# Patient Record
Sex: Female | Born: 1945
Health system: Southern US, Community
[De-identification: ages and names within clinical notes are randomized; demographics above are authoritative.]

## PROBLEM LIST (undated history)

## (undated) ENCOUNTER — Ambulatory Visit (HOSPITAL_COMMUNITY): Admission: EM | Payer: Medicare (Managed Care) | Source: Home / Self Care

## (undated) DIAGNOSIS — I340 Nonrheumatic mitral (valve) insufficiency: Secondary | ICD-10-CM

## (undated) DIAGNOSIS — I5032 Chronic diastolic (congestive) heart failure: Secondary | ICD-10-CM

## (undated) DIAGNOSIS — K219 Gastro-esophageal reflux disease without esophagitis: Secondary | ICD-10-CM

## (undated) DIAGNOSIS — I714 Abdominal aortic aneurysm, without rupture, unspecified: Secondary | ICD-10-CM

## (undated) DIAGNOSIS — Z87898 Personal history of other specified conditions: Secondary | ICD-10-CM

## (undated) DIAGNOSIS — E7800 Pure hypercholesterolemia, unspecified: Secondary | ICD-10-CM

## (undated) DIAGNOSIS — J189 Pneumonia, unspecified organism: Secondary | ICD-10-CM

## (undated) DIAGNOSIS — R011 Cardiac murmur, unspecified: Secondary | ICD-10-CM

## (undated) DIAGNOSIS — J449 Chronic obstructive pulmonary disease, unspecified: Secondary | ICD-10-CM

## (undated) DIAGNOSIS — M1990 Unspecified osteoarthritis, unspecified site: Secondary | ICD-10-CM

## (undated) DIAGNOSIS — A159 Respiratory tuberculosis unspecified: Secondary | ICD-10-CM

## (undated) DIAGNOSIS — Z9889 Other specified postprocedural states: Secondary | ICD-10-CM

## (undated) DIAGNOSIS — N179 Acute kidney failure, unspecified: Principal | ICD-10-CM

## (undated) DIAGNOSIS — Z85528 Personal history of other malignant neoplasm of kidney: Secondary | ICD-10-CM

## (undated) DIAGNOSIS — R0602 Shortness of breath: Secondary | ICD-10-CM

## (undated) DIAGNOSIS — I10 Essential (primary) hypertension: Secondary | ICD-10-CM

## (undated) HISTORY — PX: TUBAL LIGATION: SHX77

## (undated) HISTORY — DX: Nonrheumatic mitral (valve) insufficiency: I34.0

## (undated) HISTORY — DX: Abdominal aortic aneurysm, without rupture: I71.4

## (undated) HISTORY — PX: DIAGNOSTIC LAPAROSCOPY: SUR761

## (undated) HISTORY — PX: APPENDECTOMY: SHX54

## (undated) HISTORY — PX: OVARIAN CYST REMOVAL: SHX89

## (undated) HISTORY — DX: Personal history of other malignant neoplasm of kidney: Z85.528

## (undated) HISTORY — PX: KIDNEY SURGERY: SHX687

## (undated) HISTORY — PX: CHOLECYSTECTOMY: SHX55

## (undated) HISTORY — DX: Abdominal aortic aneurysm, without rupture, unspecified: I71.40

---
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
## 1969-06-20 HISTORY — PX: TUBAL LIGATION: SHX77

## 1969-06-20 HISTORY — PX: OVARIAN CYST REMOVAL: SHX89

## 1969-06-20 HISTORY — PX: APPENDECTOMY: SHX54

## 1979-06-21 DIAGNOSIS — J189 Pneumonia, unspecified organism: Secondary | ICD-10-CM

## 1979-06-21 HISTORY — DX: Pneumonia, unspecified organism: J18.9

## 2003-05-03 ENCOUNTER — Emergency Department (HOSPITAL_COMMUNITY): Admission: EM | Admit: 2003-05-03 | Discharge: 2003-05-03 | Payer: Self-pay | Admitting: Emergency Medicine

## 2003-05-03 ENCOUNTER — Encounter: Payer: Self-pay | Admitting: Emergency Medicine

## 2003-05-10 ENCOUNTER — Encounter: Admission: RE | Admit: 2003-05-10 | Discharge: 2003-05-10 | Payer: Self-pay | Admitting: Internal Medicine

## 2003-05-25 ENCOUNTER — Encounter: Admission: RE | Admit: 2003-05-25 | Discharge: 2003-05-25 | Payer: Self-pay | Admitting: Internal Medicine

## 2004-12-03 ENCOUNTER — Emergency Department (HOSPITAL_COMMUNITY): Admission: EM | Admit: 2004-12-03 | Discharge: 2004-12-03 | Payer: Self-pay | Admitting: Emergency Medicine

## 2005-06-19 ENCOUNTER — Ambulatory Visit (HOSPITAL_COMMUNITY): Admission: RE | Admit: 2005-06-19 | Discharge: 2005-06-19 | Payer: Self-pay | Admitting: Family Medicine

## 2005-07-14 ENCOUNTER — Other Ambulatory Visit: Admission: RE | Admit: 2005-07-14 | Discharge: 2005-07-14 | Payer: Self-pay | Admitting: Family Medicine

## 2009-12-27 ENCOUNTER — Encounter: Admission: RE | Admit: 2009-12-27 | Discharge: 2009-12-27 | Payer: Self-pay | Admitting: Family Medicine

## 2010-01-18 HISTORY — PX: CHOLECYSTECTOMY: SHX55

## 2010-01-18 HISTORY — PX: NEPHRECTOMY RADICAL: SUR878

## 2010-02-15 ENCOUNTER — Inpatient Hospital Stay (HOSPITAL_COMMUNITY): Admission: RE | Admit: 2010-02-15 | Discharge: 2010-02-18 | Payer: Self-pay | Admitting: Urology

## 2010-02-15 ENCOUNTER — Encounter (INDEPENDENT_AMBULATORY_CARE_PROVIDER_SITE_OTHER): Payer: Self-pay | Admitting: Urology

## 2011-01-07 LAB — CBC
HCT: 31.1 % — ABNORMAL LOW (ref 36.0–46.0)
HCT: 39.5 % (ref 36.0–46.0)
Hemoglobin: 10.5 g/dL — ABNORMAL LOW (ref 12.0–15.0)
Hemoglobin: 13.3 g/dL (ref 12.0–15.0)
MCHC: 33.6 g/dL (ref 30.0–36.0)
MCHC: 33.8 g/dL (ref 30.0–36.0)
MCV: 89.2 fL (ref 78.0–100.0)
MCV: 89.9 fL (ref 78.0–100.0)
Platelets: 261 10*3/uL (ref 150–400)
Platelets: 347 10*3/uL (ref 150–400)
RBC: 3.46 MIL/uL — ABNORMAL LOW (ref 3.87–5.11)
RBC: 4.42 MIL/uL (ref 3.87–5.11)
RDW: 12.7 % (ref 11.5–15.5)
RDW: 12.9 % (ref 11.5–15.5)
WBC: 6.4 10*3/uL (ref 4.0–10.5)
WBC: 8.4 10*3/uL (ref 4.0–10.5)

## 2011-01-07 LAB — ABO/RH: ABO/RH(D): A POS

## 2011-01-07 LAB — BASIC METABOLIC PANEL
BUN: 9 mg/dL (ref 6–23)
BUN: 15 mg/dL (ref 6–23)
BUN: 19 mg/dL (ref 6–23)
CO2: 28 mEq/L (ref 19–32)
CO2: 27 mEq/L (ref 19–32)
CO2: 27 mEq/L (ref 19–32)
Calcium: 9.3 mg/dL (ref 8.4–10.5)
Calcium: 8.6 mg/dL (ref 8.4–10.5)
Calcium: 9 mg/dL (ref 8.4–10.5)
Chloride: 105 mEq/L (ref 96–112)
Chloride: 108 mEq/L (ref 96–112)
Chloride: 109 mEq/L (ref 96–112)
Creatinine, Ser: 1.4 mg/dL — ABNORMAL HIGH (ref 0.4–1.2)
Creatinine, Ser: 0.99 mg/dL (ref 0.4–1.2)
Creatinine, Ser: 1.39 mg/dL — ABNORMAL HIGH (ref 0.4–1.2)
GFR calc Af Amer: 46 mL/min — ABNORMAL LOW (ref >60)
GFR calc Af Amer: 46 mL/min — ABNORMAL LOW (ref >60)
GFR calc Af Amer: >60 mL/min (ref >60)
GFR calc non Af Amer: 38 mL/min — ABNORMAL LOW (ref >60)
GFR calc non Af Amer: 38 mL/min — ABNORMAL LOW (ref >60)
GFR calc non Af Amer: 57 mL/min — ABNORMAL LOW (ref >60)
Glucose, Bld: 101 mg/dL — ABNORMAL HIGH (ref 70–99)
Glucose, Bld: 119 mg/dL — ABNORMAL HIGH (ref 70–99)
Glucose, Bld: 137 mg/dL — ABNORMAL HIGH (ref 70–99)
Potassium: 3.8 mEq/L (ref 3.5–5.1)
Potassium: 3.9 mEq/L (ref 3.5–5.1)
Potassium: 4.6 mEq/L (ref 3.5–5.1)
Sodium: 141 mEq/L (ref 135–145)
Sodium: 139 mEq/L (ref 135–145)
Sodium: 140 mEq/L (ref 135–145)

## 2011-01-07 LAB — COMPREHENSIVE METABOLIC PANEL
ALT: 16 U/L (ref 0–35)
AST: 22 U/L (ref 0–37)
Albumin: 4.1 g/dL (ref 3.5–5.2)
Alkaline Phosphatase: 82 U/L (ref 39–117)
BUN: 14 mg/dL (ref 6–23)
CO2: 31 mEq/L (ref 19–32)
Calcium: 10.1 mg/dL (ref 8.4–10.5)
Chloride: 105 mEq/L (ref 96–112)
Creatinine, Ser: 0.95 mg/dL (ref 0.4–1.2)
GFR calc Af Amer: >60 mL/min (ref >60)
GFR calc non Af Amer: 60 mL/min — ABNORMAL LOW (ref >60)
Glucose, Bld: 89 mg/dL (ref 70–99)
Potassium: 4.9 mEq/L (ref 3.5–5.1)
Sodium: 141 mEq/L (ref 135–145)
Total Bilirubin: 0.5 mg/dL (ref 0.3–1.2)
Total Protein: 8 g/dL (ref 6.0–8.3)

## 2011-01-07 LAB — DIFFERENTIAL
Basophils Absolute: 0 10*3/uL (ref 0.0–0.1)
Basophils Relative: 0 % (ref 0–1)
Eosinophils Absolute: 0 10*3/uL (ref 0.0–0.7)
Eosinophils Relative: 0 % (ref 0–5)
Lymphocytes Relative: 10 % — ABNORMAL LOW (ref 12–46)
Lymphs Abs: 0.9 10*3/uL (ref 0.7–4.0)
Monocytes Absolute: 0.6 10*3/uL (ref 0.1–1.0)
Monocytes Relative: 7 % (ref 3–12)
Neutro Abs: 7 10*3/uL (ref 1.7–7.7)
Neutrophils Relative %: 83 % — ABNORMAL HIGH (ref 43–77)

## 2011-01-07 LAB — TYPE AND SCREEN
ABO/RH(D): A POS
Antibody Screen: NEGATIVE

## 2011-01-07 LAB — PROTIME-INR
INR: 0.96 (ref 0.00–1.49)
Prothrombin Time: 12.7 seconds (ref 11.6–15.2)

## 2011-01-07 LAB — APTT: aPTT: 28 seconds (ref 24–37)

## 2011-01-07 LAB — HEMOGLOBIN AND HEMATOCRIT, BLOOD
HCT: 31.3 % — ABNORMAL LOW (ref 36.0–46.0)
Hemoglobin: 10.5 g/dL — ABNORMAL LOW (ref 12.0–15.0)

## 2011-09-20 HISTORY — PX: CARDIAC CATHETERIZATION: SHX172

## 2011-09-20 HISTORY — PX: US ECHOCARDIOGRAPHY: HXRAD669

## 2011-09-25 ENCOUNTER — Emergency Department (HOSPITAL_COMMUNITY): Payer: Self-pay

## 2011-09-25 ENCOUNTER — Encounter: Payer: Self-pay | Admitting: *Deleted

## 2011-09-25 ENCOUNTER — Inpatient Hospital Stay (HOSPITAL_COMMUNITY)
Admission: EM | Admit: 2011-09-25 | Discharge: 2011-10-02 | DRG: 191 | Disposition: A | Discharge status: Home / Self Care | Payer: Medicaid Other | Attending: Internal Medicine | Admitting: Internal Medicine

## 2011-09-25 DIAGNOSIS — E785 Hyperlipidemia, unspecified: Secondary | ICD-10-CM | POA: Diagnosis present

## 2011-09-25 DIAGNOSIS — Z23 Encounter for immunization: Secondary | ICD-10-CM

## 2011-09-25 DIAGNOSIS — Z9049 Acquired absence of other specified parts of digestive tract: Secondary | ICD-10-CM

## 2011-09-25 DIAGNOSIS — M278 Other specified diseases of jaws: Secondary | ICD-10-CM | POA: Diagnosis present

## 2011-09-25 DIAGNOSIS — K0530 Chronic periodontitis, unspecified: Secondary | ICD-10-CM | POA: Diagnosis present

## 2011-09-25 DIAGNOSIS — F17200 Nicotine dependence, unspecified, uncomplicated: Secondary | ICD-10-CM | POA: Diagnosis present

## 2011-09-25 DIAGNOSIS — E876 Hypokalemia: Secondary | ICD-10-CM | POA: Diagnosis present

## 2011-09-25 DIAGNOSIS — R197 Diarrhea, unspecified: Secondary | ICD-10-CM | POA: Diagnosis present

## 2011-09-25 DIAGNOSIS — I059 Rheumatic mitral valve disease, unspecified: Secondary | ICD-10-CM | POA: Diagnosis present

## 2011-09-25 DIAGNOSIS — J449 Chronic obstructive pulmonary disease, unspecified: Secondary | ICD-10-CM | POA: Diagnosis present

## 2011-09-25 DIAGNOSIS — E7800 Pure hypercholesterolemia, unspecified: Secondary | ICD-10-CM | POA: Diagnosis present

## 2011-09-25 DIAGNOSIS — I340 Nonrheumatic mitral (valve) insufficiency: Secondary | ICD-10-CM | POA: Diagnosis present

## 2011-09-25 DIAGNOSIS — J441 Chronic obstructive pulmonary disease with (acute) exacerbation: Principal | ICD-10-CM | POA: Diagnosis present

## 2011-09-25 DIAGNOSIS — Z7982 Long term (current) use of aspirin: Secondary | ICD-10-CM

## 2011-09-25 DIAGNOSIS — K029 Dental caries, unspecified: Secondary | ICD-10-CM | POA: Diagnosis present

## 2011-09-25 DIAGNOSIS — R011 Cardiac murmur, unspecified: Secondary | ICD-10-CM | POA: Diagnosis present

## 2011-09-25 DIAGNOSIS — R042 Hemoptysis: Secondary | ICD-10-CM | POA: Diagnosis not present

## 2011-09-25 DIAGNOSIS — IMO0002 Reserved for concepts with insufficient information to code with codable children: Secondary | ICD-10-CM | POA: Diagnosis present

## 2011-09-25 DIAGNOSIS — I714 Abdominal aortic aneurysm, without rupture, unspecified: Secondary | ICD-10-CM | POA: Diagnosis present

## 2011-09-25 DIAGNOSIS — A088 Other specified intestinal infections: Secondary | ICD-10-CM | POA: Diagnosis present

## 2011-09-25 DIAGNOSIS — I10 Essential (primary) hypertension: Secondary | ICD-10-CM | POA: Diagnosis present

## 2011-09-25 DIAGNOSIS — I517 Cardiomegaly: Secondary | ICD-10-CM | POA: Diagnosis present

## 2011-09-25 HISTORY — DX: Pneumonia, unspecified organism: J18.9

## 2011-09-25 HISTORY — DX: Essential (primary) hypertension: I10

## 2011-09-25 HISTORY — DX: Shortness of breath: R06.02

## 2011-09-25 HISTORY — DX: Chronic obstructive pulmonary disease, unspecified: J44.9

## 2011-09-25 HISTORY — DX: Unspecified osteoarthritis, unspecified site: M19.90

## 2011-09-25 HISTORY — DX: Gastro-esophageal reflux disease without esophagitis: K21.9

## 2011-09-25 HISTORY — DX: Personal history of other specified conditions: Z87.898

## 2011-09-25 HISTORY — DX: Pure hypercholesterolemia, unspecified: E78.00

## 2011-09-25 HISTORY — DX: Cardiac murmur, unspecified: R01.1

## 2011-09-25 MED ORDER — SODIUM CHLORIDE 0.9 % IV BOLUS (SEPSIS)
500.0000 mL | Freq: Once | INTRAVENOUS | Status: AC
  Administered 2011-09-25: 500 mL via INTRAVENOUS

## 2011-09-25 MED ORDER — ALBUTEROL SULFATE HFA 108 (90 BASE) MCG/ACT IN AERS
2.0000 | INHALATION_SPRAY | RESPIRATORY_TRACT | Status: DC | PRN
  Administered 2011-09-26: 2 via RESPIRATORY_TRACT
  Filled 2011-09-25: qty 6.7

## 2011-09-25 MED ORDER — IPRATROPIUM BROMIDE 0.02 % IN SOLN
0.5000 mg | Freq: Once | RESPIRATORY_TRACT | Status: AC
  Administered 2011-09-25: 0.5 mg via RESPIRATORY_TRACT
  Filled 2011-09-25: qty 2.5

## 2011-09-25 MED ORDER — ALBUTEROL SULFATE (5 MG/ML) 0.5% IN NEBU
2.5000 mg | INHALATION_SOLUTION | Freq: Once | RESPIRATORY_TRACT | Status: AC
  Administered 2011-09-25: 2.5 mg via RESPIRATORY_TRACT
  Filled 2011-09-25: qty 0.5

## 2011-09-25 MED ORDER — METHYLPREDNISOLONE SODIUM SUCC 125 MG IJ SOLR
125.0000 mg | Freq: Once | INTRAMUSCULAR | Status: AC
  Administered 2011-09-25: 125 mg via INTRAVENOUS
  Filled 2011-09-25: qty 2

## 2011-09-25 MED ORDER — ALBUTEROL SULFATE (5 MG/ML) 0.5% IN NEBU
2.5000 mg | INHALATION_SOLUTION | Freq: Once | RESPIRATORY_TRACT | Status: AC
  Administered 2011-09-26: 2.5 mg via RESPIRATORY_TRACT
  Filled 2011-09-25: qty 0.5

## 2011-09-25 MED ORDER — HYDROCOD POLST-CHLORPHEN POLST 10-8 MG/5ML PO LQCR
10.0000 mL | Freq: Once | ORAL | Status: AC
  Administered 2011-09-25: 10 mL via ORAL
  Filled 2011-09-25: qty 5

## 2011-09-25 NOTE — ED Provider Notes (Signed)
Medical screening examination/treatment/procedure(s) were performed by non-physician practitioner and as supervising physician I was immediately available for consultation/collaboration.   Benny Lennert, MD 09/25/11 2126

## 2011-09-25 NOTE — ED Provider Notes (Signed)
History     CSN: [id number] Arrival date & time: 09/25/2011  4:53 PM   First MD Initiated Contact with Patient 09/25/11 2006      Chief Complaint  Patient presents with  . Cough  . Fever  . Headache  . Shortness of Breath    (Consider location/radiation/quality/duration/timing/severity/associated sxs/prior treatment) HPI Comments: This patient has a history of COPD she has been without a doctor or insurance or medication for the past year.  She usually uses Advair.  She reports no history of intubations or hospitalizations.  No use of steroids within the last 6 months.  Gives history of 3 or 4 days worth of increased shortness of breath, coughing, to the point where she is vomiting, and now she has developed diarrhea to the point where her stools are liquid and triggered by any by mouth intake.  She denies dizziness, chest pain.  Patient is a 65 y.o. female presenting with cough, fever, headaches, and shortness of breath. The history is provided by the patient.  Cough This is a recurrent problem. The current episode started more than 2 days ago. The problem occurs every few minutes. The problem has been gradually worsening. The cough is non-productive. The maximum temperature recorded prior to her arrival was 100 to 100.9 F. Associated symptoms include headaches, shortness of breath and wheezing. Pertinent negatives include no chest pain, no sweats, no weight loss, no rhinorrhea, no sore throat and no myalgias. She has tried nothing for the symptoms. She is not a smoker. Her past medical history is significant for COPD.  Fever Primary symptoms of the febrile illness include fever, headaches, cough, wheezing, shortness of breath and diarrhea. Primary symptoms do not include myalgias.  The headache is not associated with weakness.  The patient's medical history is significant for COPD.  The patient's medical history is significant for COPD.  Headache  Associated symptoms include a fever and  shortness of breath.  Shortness of Breath  Associated symptoms include a fever, cough, shortness of breath and wheezing. Pertinent negatives include no chest pain, no rhinorrhea and no sore throat.    History reviewed. No pertinent past medical history.  Past Surgical History  Procedure Date  . Cholecystectomy   . Kidney surgery     History reviewed. No pertinent family history.  History  Substance Use Topics  . Smoking status: Current Everyday Smoker    Types: Cigarettes  . Smokeless tobacco: Not on file  . Alcohol Use: No    OB History    Grav Para Term Preterm Abortions TAB SAB Ect Mult Living                  Review of Systems  Constitutional: Positive for fever and activity change. Negative for weight loss.  HENT: Negative for sore throat, rhinorrhea and sinus pressure.   Respiratory: Positive for cough, shortness of breath and wheezing.   Cardiovascular: Negative for chest pain.  Gastrointestinal: Positive for diarrhea.  Genitourinary: Negative.   Musculoskeletal: Negative.  Negative for myalgias.  Skin: Positive for pallor.  Neurological: Positive for headaches. Negative for dizziness and weakness.  Hematological: Negative.   Psychiatric/Behavioral: Negative.     Allergies  Review of patient's allergies indicates no known allergies.  Home Medications   Current Outpatient Rx  Name Route Sig Dispense Refill  . ASPIRIN EFFERVESCENT 325 MG PO TBEF Oral Take 325 mg by mouth every 6 (six) hours as needed. For upset stomach     . GUAIFENESIN ER 600  MG PO TB12 Oral Take 1,200 mg by mouth 2 (two) times daily.        BP 113/91  Pulse 113  Temp(Src) 98.7 F (37.1 C) (Oral)  Resp 20  SpO2 95%  Physical Exam  Constitutional: She is oriented to person, place, and time. She appears well-developed and well-nourished.  HENT:  Head: Normocephalic.  Neck: Normal range of motion.  Cardiovascular: Tachycardia present.   Pulmonary/Chest: No respiratory distress.  She has wheezes. She exhibits no tenderness.  Abdominal: Soft. She exhibits no distension. There is no tenderness.  Musculoskeletal: Normal range of motion.  Neurological: She is oriented to person, place, and time.  Skin: Skin is warm and dry. Rash noted.  Psychiatric: She has a normal mood and affect.    ED Course  Procedures (including critical care time)  Labs Reviewed - No data to display Dg Chest 2 View  09/25/2011  *RADIOLOGY REPORT*  Clinical Data: Chest pain with cough, congestion and shortness of breath for 4 days.  CHEST - 2 VIEW  Comparison: 02/05/2010.  Findings: The heart size and mediastinal contours are stable. There is stable biapical scarring.  No confluent airspace opacity, edema or pleural effusion is present.  Osseous structures are stable.  There are multiple surgical clips in the right upper quadrant of the abdomen which are new, probably related to prior right nephrectomy.  IMPRESSION: No acute cardiopulmonary process.  Original Report Authenticated By: Gerrianne Scale, M.D.     No diagnosis found.    MDM  After review the chest x-ray reveals no pneumonia, and will treat with albuterol, Atrovent inhalers, IV Solu-Medrol, IV fluids, will check an i-STAT for electrolyte balance.  Reevaluate after respiratory treatment        Arman Filter, NP 09/25/11 2048

## 2011-09-25 NOTE — ED Notes (Signed)
Pt reports several day hx of coughing, intermittent fevers, headache and sob.

## 2011-09-26 ENCOUNTER — Encounter (HOSPITAL_COMMUNITY): Payer: Self-pay | Admitting: *Deleted

## 2011-09-26 ENCOUNTER — Other Ambulatory Visit: Payer: Self-pay

## 2011-09-26 DIAGNOSIS — J441 Chronic obstructive pulmonary disease with (acute) exacerbation: Secondary | ICD-10-CM | POA: Diagnosis present

## 2011-09-26 DIAGNOSIS — C649 Malignant neoplasm of unspecified kidney, except renal pelvis: Secondary | ICD-10-CM | POA: Insufficient documentation

## 2011-09-26 DIAGNOSIS — I10 Essential (primary) hypertension: Secondary | ICD-10-CM | POA: Diagnosis present

## 2011-09-26 DIAGNOSIS — R0602 Shortness of breath: Secondary | ICD-10-CM

## 2011-09-26 DIAGNOSIS — Z9049 Acquired absence of other specified parts of digestive tract: Secondary | ICD-10-CM

## 2011-09-26 DIAGNOSIS — R011 Cardiac murmur, unspecified: Secondary | ICD-10-CM | POA: Diagnosis present

## 2011-09-26 DIAGNOSIS — J449 Chronic obstructive pulmonary disease, unspecified: Secondary | ICD-10-CM | POA: Diagnosis present

## 2011-09-26 DIAGNOSIS — E785 Hyperlipidemia, unspecified: Secondary | ICD-10-CM | POA: Diagnosis present

## 2011-09-26 HISTORY — DX: Shortness of breath: R06.02

## 2011-09-26 LAB — COMPREHENSIVE METABOLIC PANEL
ALT: 14 U/L (ref 0–35)
Albumin: 3.1 g/dL — ABNORMAL LOW (ref 3.5–5.2)
Alkaline Phosphatase: 94 U/L (ref 39–117)
Alkaline Phosphatase: 80 U/L (ref 39–117)
BUN: 23 mg/dL (ref 6–23)
BUN: 29 mg/dL — ABNORMAL HIGH (ref 6–23)
CO2: 22 mEq/L (ref 19–32)
CO2: 24 mEq/L (ref 19–32)
Chloride: 104 mEq/L (ref 96–112)
GFR calc Af Amer: 50 mL/min — ABNORMAL LOW (ref >90)
GFR calc non Af Amer: 43 mL/min — ABNORMAL LOW (ref >90)
GFR calc non Af Amer: 44 mL/min — ABNORMAL LOW (ref >90)
Glucose, Bld: 201 mg/dL — ABNORMAL HIGH (ref 70–99)
Potassium: 3.3 mEq/L — ABNORMAL LOW (ref 3.5–5.1)
Potassium: 4 mEq/L (ref 3.5–5.1)
Sodium: 141 mEq/L (ref 135–145)
Total Bilirubin: 0.1 mg/dL — ABNORMAL LOW (ref 0.3–1.2)
Total Protein: 7.7 g/dL (ref 6.0–8.3)

## 2011-09-26 LAB — DIFFERENTIAL
Basophils Absolute: 0 10*3/uL (ref 0.0–0.1)
Basophils Relative: 0 % (ref 0–1)
Eosinophils Relative: 0 % (ref 0–5)
Lymphocytes Relative: 25 % (ref 12–46)
Lymphocytes Relative: 8 % — ABNORMAL LOW (ref 12–46)
Lymphs Abs: 0.6 10*3/uL — ABNORMAL LOW (ref 0.7–4.0)
Lymphs Abs: 0.6 10*3/uL — ABNORMAL LOW (ref 0.7–4.0)
Monocytes Absolute: 0 10*3/uL — ABNORMAL LOW (ref 0.1–1.0)
Monocytes Relative: 3 % (ref 3–12)
Neutro Abs: 1.6 10*3/uL — ABNORMAL LOW (ref 1.7–7.7)
Neutro Abs: 7 10*3/uL (ref 1.7–7.7)
Neutrophils Relative %: 89 % — ABNORMAL HIGH (ref 43–77)

## 2011-09-26 LAB — BASIC METABOLIC PANEL
CO2: 24 mEq/L (ref 19–32)
Calcium: 9.7 mg/dL (ref 8.4–10.5)
Chloride: 103 mEq/L (ref 96–112)
Creatinine, Ser: 1.28 mg/dL — ABNORMAL HIGH (ref 0.50–1.10)
Glucose, Bld: 197 mg/dL — ABNORMAL HIGH (ref 70–99)

## 2011-09-26 LAB — CBC
HCT: 37 % (ref 36.0–46.0)
HCT: 33.3 % — ABNORMAL LOW (ref 36.0–46.0)
Hemoglobin: 12.2 g/dL (ref 12.0–15.0)
Hemoglobin: 11 g/dL — ABNORMAL LOW (ref 12.0–15.0)
MCHC: 33 g/dL (ref 30.0–36.0)
MCV: 89.6 fL (ref 78.0–100.0)
RBC: 4.13 MIL/uL (ref 3.87–5.11)
RBC: 3.75 MIL/uL — ABNORMAL LOW (ref 3.87–5.11)
WBC: 2.2 10*3/uL — ABNORMAL LOW (ref 4.0–10.5)
WBC: 7.9 10*3/uL (ref 4.0–10.5)

## 2011-09-26 MED ORDER — INFLUENZA VIRUS VACC SPLIT PF IM SUSP
0.5000 mL | INTRAMUSCULAR | Status: AC
  Administered 2011-09-27: 0.5 mL via INTRAMUSCULAR
  Filled 2011-09-26: qty 0.5

## 2011-09-26 MED ORDER — PNEUMOCOCCAL VAC POLYVALENT 25 MCG/0.5ML IJ INJ
0.5000 mL | INJECTION | INTRAMUSCULAR | Status: AC
  Administered 2011-09-27: 0.5 mL via INTRAMUSCULAR
  Filled 2011-09-26: qty 0.5

## 2011-09-26 MED ORDER — DOXYCYCLINE HYCLATE 100 MG PO TABS
100.0000 mg | ORAL_TABLET | Freq: Two times a day (BID) | ORAL | Status: DC
  Administered 2011-09-26 – 2011-10-02 (×11): 100 mg via ORAL
  Filled 2011-09-26 (×16): qty 1

## 2011-09-26 MED ORDER — ALBUTEROL SULFATE (5 MG/ML) 0.5% IN NEBU
INHALATION_SOLUTION | RESPIRATORY_TRACT | Status: AC
  Administered 2011-09-26: 2.5 mg
  Filled 2011-09-26: qty 2

## 2011-09-26 MED ORDER — ALBUTEROL (5 MG/ML) CONTINUOUS INHALATION SOLN
10.0000 mg/h | INHALATION_SOLUTION | Freq: Once | RESPIRATORY_TRACT | Status: AC
  Administered 2011-09-26: 10 mg/h via RESPIRATORY_TRACT

## 2011-09-26 MED ORDER — IPRATROPIUM BROMIDE 0.02 % IN SOLN
RESPIRATORY_TRACT | Status: AC
  Filled 2011-09-26: qty 2.5

## 2011-09-26 MED ORDER — ALBUTEROL SULFATE HFA 108 (90 BASE) MCG/ACT IN AERS
2.0000 | INHALATION_SPRAY | RESPIRATORY_TRACT | Status: DC | PRN
  Filled 2011-09-26: qty 6.7

## 2011-09-26 MED ORDER — ACETAMINOPHEN 325 MG PO TABS: 650.0000 mg | ORAL_TABLET | Freq: Four times a day (QID) | ORAL | Status: DC | PRN

## 2011-09-26 MED ORDER — ALBUTEROL SULFATE (5 MG/ML) 0.5% IN NEBU
2.5000 mg | INHALATION_SOLUTION | RESPIRATORY_TRACT | Status: DC
Start: 2011-09-26 — End: 2011-09-26

## 2011-09-26 MED ORDER — ALBUTEROL SULFATE (5 MG/ML) 0.5% IN NEBU
INHALATION_SOLUTION | RESPIRATORY_TRACT | Status: AC
  Filled 2011-09-26: qty 0.5

## 2011-09-26 MED ORDER — NICOTINE 21 MG/24HR TD PT24
21.0000 mg | MEDICATED_PATCH | Freq: Every day | TRANSDERMAL | Status: DC
  Administered 2011-09-27 – 2011-10-02 (×7): 21 mg via TRANSDERMAL
  Filled 2011-09-26 (×6): qty 1

## 2011-09-26 MED ORDER — FLUTICASONE-SALMETEROL 250-50 MCG/DOSE IN AEPB
1.0000 | INHALATION_SPRAY | Freq: Two times a day (BID) | RESPIRATORY_TRACT | Status: DC
  Administered 2011-09-27 – 2011-10-02 (×11): 1 via RESPIRATORY_TRACT
  Filled 2011-09-26: qty 14

## 2011-09-26 MED ORDER — TIOTROPIUM BROMIDE MONOHYDRATE 18 MCG IN CAPS
18.0000 ug | ORAL_CAPSULE | Freq: Every day | RESPIRATORY_TRACT | Status: DC
  Filled 2011-09-26: qty 5

## 2011-09-26 MED ORDER — IPRATROPIUM BROMIDE 0.02 % IN SOLN
0.5000 mg | Freq: Once | RESPIRATORY_TRACT | Status: AC
  Administered 2011-09-26: 0.5 mg via RESPIRATORY_TRACT

## 2011-09-26 MED ORDER — SODIUM CHLORIDE 0.9 % IV SOLN: INTRAVENOUS | Status: AC

## 2011-09-26 MED ORDER — ALBUTEROL SULFATE (5 MG/ML) 0.5% IN NEBU
2.5000 mg | INHALATION_SOLUTION | Freq: Four times a day (QID) | RESPIRATORY_TRACT | Status: DC
  Administered 2011-09-26 – 2011-09-27 (×3): 2.5 mg via RESPIRATORY_TRACT
  Filled 2011-09-26 (×3): qty 0.5

## 2011-09-26 MED ORDER — DOCUSATE SODIUM 100 MG PO CAPS
100.0000 mg | ORAL_CAPSULE | Freq: Two times a day (BID) | ORAL | Status: DC
  Administered 2011-09-26 – 2011-10-02 (×8): 100 mg via ORAL
  Filled 2011-09-26 (×13): qty 1

## 2011-09-26 MED ORDER — ALBUTEROL (5 MG/ML) CONTINUOUS INHALATION SOLN: 5.0000 mg/h | INHALATION_SOLUTION | Freq: Once | RESPIRATORY_TRACT | Status: DC

## 2011-09-26 MED ORDER — IPRATROPIUM BROMIDE 0.02 % IN SOLN: 0.5000 mg | Freq: Once | RESPIRATORY_TRACT | Status: DC

## 2011-09-26 MED ORDER — POTASSIUM CHLORIDE CRYS ER 20 MEQ PO TBCR
EXTENDED_RELEASE_TABLET | ORAL | Status: AC
  Administered 2011-09-26: 40 meq via ORAL
  Filled 2011-09-26: qty 2

## 2011-09-26 MED ORDER — IPRATROPIUM BROMIDE 0.02 % IN SOLN: 0.5000 mg | RESPIRATORY_TRACT | Status: DC

## 2011-09-26 MED ORDER — POTASSIUM CHLORIDE CRYS ER 20 MEQ PO TBCR
40.0000 meq | EXTENDED_RELEASE_TABLET | Freq: Once | ORAL | Status: AC
  Administered 2011-09-26: 40 meq via ORAL

## 2011-09-26 MED ORDER — ACETAMINOPHEN 325 MG PO TABS
650.0000 mg | ORAL_TABLET | Freq: Once | ORAL | Status: AC
  Administered 2011-09-26: 650 mg via ORAL
  Filled 2011-09-26: qty 2

## 2011-09-26 MED ORDER — ENOXAPARIN SODIUM 40 MG/0.4ML ~~LOC~~ SOLN
40.0000 mg | SUBCUTANEOUS | Status: DC
  Administered 2011-09-26 – 2011-09-29 (×4): 40 mg via SUBCUTANEOUS
  Filled 2011-09-26 (×6): qty 0.4

## 2011-09-26 MED ORDER — METHYLPREDNISOLONE SODIUM SUCC 125 MG IJ SOLR
80.0000 mg | Freq: Three times a day (TID) | INTRAMUSCULAR | Status: DC
  Administered 2011-09-26 (×3): 80 mg via INTRAVENOUS
  Filled 2011-09-26 (×6): qty 2

## 2011-09-26 MED ORDER — ONDANSETRON HCL 4 MG/2ML IJ SOLN: 4.0000 mg | Freq: Four times a day (QID) | INTRAMUSCULAR | Status: DC | PRN

## 2011-09-26 MED ORDER — GUAIFENESIN-CODEINE 100-10 MG/5ML PO SOLN: 5.0000 mL | Freq: Four times a day (QID) | ORAL | Status: DC | PRN

## 2011-09-26 MED ORDER — BENZONATATE 100 MG PO CAPS
100.0000 mg | ORAL_CAPSULE | Freq: Three times a day (TID) | ORAL | Status: DC | PRN
  Administered 2011-09-26 – 2011-09-28 (×2): 100 mg via ORAL
  Filled 2011-09-26 (×3): qty 1

## 2011-09-26 NOTE — ED Provider Notes (Signed)
Medical screening examination/treatment/procedure(s) were performed by non-physician practitioner and as supervising physician I was immediately available for consultation/collaboration.  Hurman Horn, MD 09/26/11 [phone]

## 2011-09-26 NOTE — ED Notes (Addendum)
Pt has increased cough after nebulizer treatment.  Lungs are more clear now with no inspiratory wheezing but expiratory wheezing is still prominent. She now has a productive cough with clear yellow tinged phlegm.  Specimen cup was given for collection .

## 2011-09-26 NOTE — ED Notes (Signed)
Meal ordered for pt 

## 2011-09-26 NOTE — ED Notes (Signed)
Report received and pt care assumed from Katie [name], [place].  Pt placed in room CDU 11.  VSS, no dyspnea noted at this time.

## 2011-09-26 NOTE — ED Notes (Signed)
Pt to be ambulated to assess tolerance of breathing and pulse oximetry with mvt after morning breakfast. Diet tray ordered for pt.

## 2011-09-26 NOTE — Progress Notes (Signed)
I spoke with patient about a primary physician and health care coverage. Patient denied having either. With patient permission, contacted Johney Frame and faxed her demographic information to set up orange card eligibility.

## 2011-09-26 NOTE — ED Provider Notes (Signed)
Medical screening examination/treatment/procedure(s) were performed by non-physician practitioner and as supervising physician I was immediately available for consultation/collaboration.  Sharnita Bogucki M Kemari Narez, MD 09/26/11 1753 

## 2011-09-26 NOTE — ED Notes (Signed)
Called resp therapy . Spoke with Katie [name]. Will come see pt . He is on [id number] at this time.

## 2011-09-26 NOTE — ED Provider Notes (Signed)
History     CSN: [id number] Arrival date & time: 09/25/2011  4:53 PM   First MD Initiated Contact with Patient 09/25/11 2006      Chief Complaint  Patient presents with  . Cough  . Fever  . Headache  . Shortness of Breath    (Consider location/radiation/quality/duration/timing/severity/associated sxs/prior treatment) HPI  Past Medical History  Diagnosis Date  . Arthritis   . COPD (chronic obstructive pulmonary disease)   . Hypertension   . History of heartburn   . Hypercholesteremia     Past Surgical History  Procedure Date  . Cholecystectomy   . Kidney surgery     History reviewed. No pertinent family history.  History  Substance Use Topics  . Smoking status: Current Everyday Smoker    Types: Cigarettes  . Smokeless tobacco: Not on file  . Alcohol Use: No    OB History    Grav Para Term Preterm Abortions TAB SAB Ect Mult Living                  Review of Systems  Allergies  Review of patient's allergies indicates no known allergies.  Home Medications   Current Outpatient Rx  Name Route Sig Dispense Refill  . ASPIRIN EFFERVESCENT 325 MG PO TBEF Oral Take 325 mg by mouth every 6 (six) hours as needed. For upset stomach     . GUAIFENESIN ER 600 MG PO TB12 Oral Take 1,200 mg by mouth 2 (two) times daily.        BP 119/69  Pulse 79  Temp(Src) 97.7 F (36.5 C) (Oral)  Resp 22  SpO2 97%  Physical Exam  ED Course  Procedures (including critical care time)  Labs Reviewed - No data to display Dg Chest 2 View  09/25/2011  *RADIOLOGY REPORT*  Clinical Data: Chest pain with cough, congestion and shortness of breath for 4 days.  CHEST - 2 VIEW  Comparison: 02/05/2010.  Findings: The heart size and mediastinal contours are stable. There is stable biapical scarring.  No confluent airspace opacity, edema or pleural effusion is present.  Osseous structures are stable.  There are multiple surgical clips in the right upper quadrant of the abdomen which are  new, probably related to prior right nephrectomy.  IMPRESSION: No acute cardiopulmonary process.  Original Report Authenticated By: Gerrianne Scale, M.D.     1. COPD exacerbation     7:44 AM Handoff from Dr. Fonnie Jarvis. Will re-eval and likely admit for asthma exacerbation.   8:55 AM patient seen by myself and reevaluated. Patient endorses a 2 year history of emphysema. She denies being on any oxygen at home. She admits to not taking any medications for COPD for the past one year due to not having a primary care doctor. Patient continues to be tachypneic with moderate expiratory wheezing throughout all lung fields. Additional breathing treatment ordered. Patient has been in the emergency department for 16 hours and will likely not improve enough for discharge. I have called for admission and outpatient clinics will admit the patient for COPD exacerbation. The patient verbalizes understanding and agrees with this plan.  MDM  Admit for COPD exacerbation        Carolee Rota, [place] 09/26/11 [phone]

## 2011-09-26 NOTE — ED Notes (Signed)
Called Katie [name] with resp therapy. He is aware of change of tx

## 2011-09-26 NOTE — ED Notes (Signed)
Spoke with dr bednar about pt. He will have pa start admit process on pt

## 2011-09-26 NOTE — ED Notes (Signed)
Pt c/o mild headache and requesting pain medication.  PA [facility] notified.  Awaiting new order.

## 2011-09-26 NOTE — ED Notes (Signed)
Report given and care endorsed to Ryan, RN.

## 2011-09-26 NOTE — ED Notes (Signed)
Patient is resting comfortably with eyes closed, resp equal/nonlabored.  Pt is easily roused and refuses neb treatment at this time.

## 2011-09-26 NOTE — H&P (Signed)
Date: 09/26/2011                  Patient Name:  Katie [name]  MRN: [id number]  DOB: November 18, 1945  Age / Sex: 65 y.o., female   PCP: No primary provider on file.                 Medical Service: Internal Medicine Teaching Service                 Attending Physician: Staci Righter, MD     First Contact: Dr. Arvilla Market  Pager: [phone]  Second Contact: Dr. Dierdre Searles  Pager: [phone]              After Hours (After 5pm / weekends / holidays): First Contact  Pager: [phone]   Second Contact  Pager: [phone]     Chief Complaint: Shortness of breath  History of Present Illness: Patient is a 65 y.o. female with a PMHx of emphysema diagnosed by chest x-ray in 2010, hypertension, hyperlipidemia, GERD who presents to [facility] for evaluation of shortness of breath and cough for the last 5 days. Patient states that she has been short of breath for the last week and that has progressively gotten worse over the last day. Patient came to emergency department yesterday at 4:30 PM to seek further evaluation. Of note patient states that she has not been on any inhalers she has not had a PCP to prescribe them. Patient complains of productive cough with white sputum. No fevers but does complain of chills. She notes that her husband has been sick. She reports that she was diagnosed with emphysema based on a chest x-ray 2 years ago. She denies nausea, vomiting, constipation. However she does report diarrhea for the last 3 days. She states that she had 5 loose bowel movements yesterday. She denies been on recent antibiotic therapy. She also denies it being related to anything she has eaten. Additionally patient states that she has only drank coffee because she is afraid of diarrhea. She denies any urinary symptoms such as dysuria, urinary frequency or hesitancy.  Current Outpatient Medications:  (Not in a hospital admission)  Allergies: No Known Allergies   Past Medical History: Past Medical History  Diagnosis Date    . Arthritis   . COPD (chronic obstructive pulmonary disease)     no history of PFTs, only diagnosed by CXR, not on any inhalers because she has not had a PCP in over 2 years  . Hypertension     previously on Lisinopril/HCTZ  . History of heartburn   . Hypercholesteremia     previously on pravastatin   . Cholelithiasis 2011    s/p cholescystectomy   . Renal cell carcinoma 01/2010    s/p right radical nephrectomy 01/2010,  followed by [facility]  . Murmur     no  prior work-up    Past Surgical History: Past Surgical History  Procedure Date  . Cholecystectomy 01/2010  . Kidney surgery 01/2010    right radical nephrectomy  . Tubal ligation     Family History: Family History  Problem Relation Age of Onset  . COPD Mother     DECEASED    Social History: History   Social History  . Marital Status: Married    Spouse Name: N/A    Number of Children: 3  . Years of Education: 12   Occupational History  . Futures trader business   Social History Main  Topics  . Smoking status: Current Everyday Smoker -- 1.0 packs/day for 50 years    Types: Cigarettes  . Smokeless tobacco: Not on file  . Alcohol Use: No  . Drug Use: No  . Sexually Active: Not on file   Other Topics Concern  . Not on file   Social History Narrative   Lives in [place] with her husband. 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 Patient has 3 grown children that do not live with her. Patient does not have any medical insurance.    Review of Systems: As per HPI  Vital Signs: Blood pressure 119/69, pulse 79, temperature 97.7 F (36.5 C), temperature source Oral, resp. rate 22, SpO2 97.00%.  Physical Exam: General: Vital signs reviewed and noted. Well-developed, well-nourished, in no acute distress; alert, appropriate and cooperative throughout examination.  Throat: Oropharynx nonerythematous, no exudate appreciated.   Neck: No deformities,  masses, or tenderness noted.Supple, No carotid Bruits, no JVD.  Lungs:  Coarse breath sounds, upper airway noises, bilateral wheezing heard on expiration  Heart: RRR. 3/6 holosystolic murmur  Abdomen:  BS normoactive. Soft, Nondistended, non-tender.  No masses or organomegaly.  Extremities: No pretibial edema.  Neurologic: nonfocal  Skin: No visible rashes, scars.   Lab results: Basic Metabolic Panel: Recent Labs  [facility] 09/26/11 0942 09/26/11 0902   NA 141 141   K 3.3* 4.2   CL 103 103   CO2 22 24   GLUCOSE 201* 197*   BUN 23 22   CREATININE 1.29* 1.28*   CALCIUM 9.6 9.7   MG -- --   PHOS -- --   Liver Function Tests: Recent Labs  [facility] 09/26/11 0942   AST 20   ALT 14   ALKPHOS 94   BILITOT 0.1*   PROT 7.7   ALBUMIN 3.2*   CBC: Recent Labs  [facility] 09/26/11 0902   WBC 2.2*   NEUTROABS 1.6*   HGB 12.2   HCT 37.0   MCV 89.6   PLT 195    Imaging results:  Dg Chest 2 View  09/25/2011  *RADIOLOGY REPORT*  Clinical Data: Chest pain with cough, congestion and shortness of breath for 4 days.  CHEST - 2 VIEW  Comparison: 02/05/2010.  Findings: The heart size and mediastinal contours are stable. There is stable biapical scarring.  No confluent airspace opacity, edema or pleural effusion is present.  Osseous structures are stable.  There are multiple surgical clips in the right upper quadrant of the abdomen which are new, probably related to prior right nephrectomy.  IMPRESSION: No acute cardiopulmonary process.  Original Report Authenticated By: Gerrianne Scale, M.D.    Assessment & Plan:  1.) COPD exacerbation (09/26/2011)  Assessment: Onset of cough with productive sputum, along with wheezing and shortness of breath c/w COPD exacerbation.Other less likely causes of SOB include PNA and PE, however unlikely PNA as patient is afebrile, and no radiographic evidence. PE also unlikely as Geneva score shows very low probability.   Plan: Solumedrol 60 mg IV  TID -Doxycycline 100 mg po BID -Duo nebs -Spriva for maintenance -Albuterol prn   2.) COPD (chronic obstructive pulmonary disease) (09/26/2011)      Assessment: No formal PFTs.    Plan: Treat as current COPD exacerbation   - D/C patient on Spiriva and albuterol   - Outpatient PFTs  3.) Murmur (09/26/2011)  Assessment: On physical exam patient has a distinct holosystolic murmur concerning for aortic stenosis. Additionally patient provides history  of syncopal episodes with two recent episodes over the last 2 months. Patient reports she "just fell out."     Plan: 2D Echo with contrast for further evaluation   4.) Diarrhea - Exact cause unknown. Suspect viral gastroenteritis. C Diff unlikely given no recent abx use. Patient is slightly hypokalemic, likely related to GI losses.   Plan: IV hydration -K repletion  -Monitor closely. Of note, patient has low WBC and ANC and she has had normal white count in the past. This is concerning for infectious etiology.  -Consider stool studies for further work-up   5.) History of HTN - BP currently WNL. Will monitor for now.    DVT PPX - Lovenox     Sayra Frisby 09/26/2011, 11:15 AM

## 2011-09-26 NOTE — ED Notes (Signed)
Pt requesting something for cough. NP aware, no order given. Pt understands that it is not time for another dose of cough medication.

## 2011-09-26 NOTE — ED Notes (Signed)
Pt has finished her lunch but states she is still hungry. Sandwich and applesauce to pt

## 2011-09-26 NOTE — ED Provider Notes (Signed)
History     CSN: [id number] Arrival date & time: 09/25/2011  4:53 PM   First MD Initiated Contact with Patient 09/25/11 2006      Chief Complaint  Patient presents with  . Cough  . Fever  . Headache  . Shortness of Breath    (Consider location/radiation/quality/duration/timing/severity/associated sxs/prior treatment) HPI  History reviewed. No pertinent past medical history.  Past Surgical History  Procedure Date  . Cholecystectomy   . Kidney surgery     History reviewed. No pertinent family history.  History  Substance Use Topics  . Smoking status: Current Everyday Smoker    Types: Cigarettes  . Smokeless tobacco: Not on file  . Alcohol Use: No    OB History    Grav Para Term Preterm Abortions TAB SAB Ect Mult Living                  Review of Systems  Allergies  Review of patient's allergies indicates no known allergies.  Home Medications   Current Outpatient Rx  Name Route Sig Dispense Refill  . ASPIRIN EFFERVESCENT 325 MG PO TBEF Oral Take 325 mg by mouth every 6 (six) hours as needed. For upset stomach     . GUAIFENESIN ER 600 MG PO TB12 Oral Take 1,200 mg by mouth 2 (two) times daily.        BP 131/83  Pulse 86  Temp(Src) 98 F (36.7 C) (Oral)  Resp 20  SpO2 95%  Physical Exam  ED Course  Procedures (including critical care time)  Labs Reviewed - No data to display Dg Chest 2 View  09/25/2011  *RADIOLOGY REPORT*  Clinical Data: Chest pain with cough, congestion and shortness of breath for 4 days.  CHEST - 2 VIEW  Comparison: 02/05/2010.  Findings: The heart size and mediastinal contours are stable. There is stable biapical scarring.  No confluent airspace opacity, edema or pleural effusion is present.  Osseous structures are stable.  There are multiple surgical clips in the right upper quadrant of the abdomen which are new, probably related to prior right nephrectomy.  IMPRESSION: No acute cardiopulmonary process.  Original Report  Authenticated By: Gerrianne Scale, M.D.     Asthma exacerbation    MDM  Patient here from stretcher triage - reports several day history of shortness of breath and dry and hacking cough, continues with expiratory wheezing, though cough better after the tussionex.  Will continue to monitor, is on asthma protocol.      1:08 AM  Discussed the patient with Dr. Fonnie Jarvis, stable at this time, he will assume care.  Izola Price [place], [place] 09/26/11 [phone]

## 2011-09-26 NOTE — ED Notes (Signed)
Report called to [phone].  

## 2011-09-27 DIAGNOSIS — J441 Chronic obstructive pulmonary disease with (acute) exacerbation: Secondary | ICD-10-CM

## 2011-09-27 LAB — BLOOD GAS, ARTERIAL
Acid-base deficit: 7.7 mmol/L — ABNORMAL HIGH (ref 0.0–2.0)
Bicarbonate: 16.7 mEq/L — ABNORMAL LOW (ref 20.0–24.0)
Drawn by: 283381
FIO2: 0.21 %
pCO2 arterial: 30.1 mmHg — ABNORMAL LOW (ref 35.0–45.0)
pO2, Arterial: 84 mmHg (ref 80.0–100.0)

## 2011-09-27 LAB — CBC
Platelets: 182 10*3/uL (ref 150–400)
RBC: 3.58 MIL/uL — ABNORMAL LOW (ref 3.87–5.11)
RDW: 12.9 % (ref 11.5–15.5)
WBC: 10.3 10*3/uL (ref 4.0–10.5)

## 2011-09-27 LAB — BASIC METABOLIC PANEL
Calcium: 9.4 mg/dL (ref 8.4–10.5)
Creatinine, Ser: 1.12 mg/dL — ABNORMAL HIGH (ref 0.50–1.10)
GFR calc Af Amer: 59 mL/min — ABNORMAL LOW (ref >90)
GFR calc non Af Amer: 51 mL/min — ABNORMAL LOW (ref >90)
Sodium: 141 mEq/L (ref 135–145)

## 2011-09-27 MED ORDER — ALBUTEROL SULFATE (5 MG/ML) 0.5% IN NEBU
2.5000 mg | INHALATION_SOLUTION | Freq: Four times a day (QID) | RESPIRATORY_TRACT | Status: DC
  Administered 2011-09-27 – 2011-09-29 (×9): 2.5 mg via RESPIRATORY_TRACT
  Filled 2011-09-27 (×9): qty 0.5

## 2011-09-27 MED ORDER — PREDNISONE 20 MG PO TABS
40.0000 mg | ORAL_TABLET | Freq: Every day | ORAL | Status: DC
  Administered 2011-09-27 – 2011-10-02 (×4): 40 mg via ORAL
  Filled 2011-09-27 (×6): qty 2

## 2011-09-27 MED ORDER — IPRATROPIUM BROMIDE 0.02 % IN SOLN
0.5000 mg | RESPIRATORY_TRACT | Status: DC | PRN
  Filled 2011-09-27: qty 2.5

## 2011-09-27 MED ORDER — ALPRAZOLAM 0.25 MG PO TABS
0.2500 mg | ORAL_TABLET | Freq: Three times a day (TID) | ORAL | Status: DC | PRN
  Administered 2011-09-27: 0.25 mg via ORAL
  Filled 2011-09-27: qty 1

## 2011-09-27 MED ORDER — IPRATROPIUM BROMIDE 0.02 % IN SOLN
RESPIRATORY_TRACT | Status: AC
  Administered 2011-09-27: 0.5 mg
  Filled 2011-09-27: qty 2.5

## 2011-09-27 MED ORDER — ENSURE PUDDING PO PUDG
1.0000 | Freq: Two times a day (BID) | ORAL | Status: DC
  Administered 2011-09-27 – 2011-10-02 (×8): 1 via ORAL
  Filled 2011-09-27: qty 1

## 2011-09-27 MED ORDER — ALBUTEROL SULFATE (5 MG/ML) 0.5% IN NEBU: 2.5000 mg | INHALATION_SOLUTION | RESPIRATORY_TRACT | Status: DC | PRN

## 2011-09-27 MED ORDER — ALPRAZOLAM 0.25 MG PO TABS
0.2500 mg | ORAL_TABLET | Freq: Once | ORAL | Status: AC
  Administered 2011-09-27: 0.25 mg via ORAL
  Filled 2011-09-27: qty 1

## 2011-09-27 MED ORDER — ALBUTEROL SULFATE (5 MG/ML) 0.5% IN NEBU
2.5000 mg | INHALATION_SOLUTION | RESPIRATORY_TRACT | Status: DC | PRN
  Filled 2011-09-27: qty 0.5

## 2011-09-27 MED ORDER — IPRATROPIUM BROMIDE 0.02 % IN SOLN
0.5000 mg | Freq: Four times a day (QID) | RESPIRATORY_TRACT | Status: DC
  Administered 2011-09-27: 0.5 mg via RESPIRATORY_TRACT

## 2011-09-27 MED ORDER — GUAIFENESIN ER 600 MG PO TB12
600.0000 mg | ORAL_TABLET | Freq: Two times a day (BID) | ORAL | Status: DC
  Administered 2011-09-27 – 2011-10-02 (×9): 600 mg via ORAL
  Filled 2011-09-27 (×12): qty 1

## 2011-09-27 MED ORDER — MAGNESIUM HYDROXIDE 400 MG/5ML PO SUSP: 30.0000 mL | Freq: Every day | ORAL | Status: DC | PRN

## 2011-09-27 MED ORDER — IPRATROPIUM BROMIDE 0.02 % IN SOLN: 0.5000 mg | RESPIRATORY_TRACT | Status: DC | PRN

## 2011-09-27 MED ORDER — ALBUTEROL SULFATE (5 MG/ML) 0.5% IN NEBU
INHALATION_SOLUTION | RESPIRATORY_TRACT | Status: AC
  Administered 2011-09-27: 2.5 mg
  Filled 2011-09-27: qty 0.5

## 2011-09-27 MED ORDER — IPRATROPIUM BROMIDE 0.02 % IN SOLN
0.5000 mg | Freq: Four times a day (QID) | RESPIRATORY_TRACT | Status: DC
  Administered 2011-09-27 – 2011-09-29 (×8): 0.5 mg via RESPIRATORY_TRACT
  Filled 2011-09-27 (×9): qty 2.5

## 2011-09-27 MED ORDER — PREDNISOLONE 5 MG PO TABS: 40.0000 mg | ORAL_TABLET | Freq: Every day | ORAL | Status: DC

## 2011-09-27 NOTE — H&P (Signed)
Internal Medicine Teaching Service Attending Note Date: 09/27/2011  Patient name: Katie [name]  Medical record number: [id number]  Date of birth: 1946-08-13   I have seen and evaluated Sherre Lain and discussed their care with the Residency Team.  65 yo with no real PCP and long history of smoking presented with respiratory distress.  Also a history of hypertension.  Increased cough and sputum production which has been progressively worsening for a week.  Also diarrhea.  + sick contact.    Physical Exam: Blood pressure 97/59, pulse 93, temperature 98.2 F (36.8 C), temperature source Oral, resp. rate 20, height 5\' 1"  (1.549 m), weight 147 lb 4.3 oz (66.8 kg), SpO2 94.00%. BP 97/59  Pulse 93  Temp(Src) 98.2 F (36.8 C) (Oral)  Resp 20  Ht 5\' 1"  (1.549 m)  Wt 147 lb 4.3 oz (66.8 kg)  BMI 27.83 kg/m2  SpO2 94% General appearance: alert, cooperative and mild distress Lungs: wheezes bilaterally Heart: systolic murmur: holosystolic 3/6, left side at 2nd left intercostal space  Lab results: Results for orders placed during the hospital encounter of 09/25/11 (from the past 24 hour(s))  CBC     Status: Abnormal   Collection Time   09/26/11  6:48 PM      Component Value Range   WBC 7.9  4.0 - 10.5 (K/uL)   RBC 3.75 (*) 3.87 - 5.11 (MIL/uL)   Hemoglobin 11.0 (*) 12.0 - 15.0 (g/dL)   HCT 86.5 (*) 78.4 - 46.0 (%)   MCV 88.8  78.0 - 100.0 (fL)   MCH 29.3  26.0 - 34.0 (pg)   MCHC 33.0  30.0 - 36.0 (g/dL)   RDW 69.6  29.5 - 28.4 (%)   Platelets 175  150 - 400 (K/uL)  DIFFERENTIAL     Status: Abnormal   Collection Time   09/26/11  6:48 PM      Component Value Range   Neutrophils Relative 89 (*) 43 - 77 (%)   Neutro Abs 7.0  1.7 - 7.7 (K/uL)   Lymphocytes Relative 8 (*) 12 - 46 (%)   Lymphs Abs 0.6 (*) 0.7 - 4.0 (K/uL)   Monocytes Relative 3  3 - 12 (%)   Monocytes Absolute 0.2  0.1 - 1.0 (K/uL)   Eosinophils Relative 0  0 - 5 (%)   Eosinophils Absolute 0.0  0.0 - 0.7 (K/uL)   Basophils Relative 0  0 - 1 (%)   Basophils Absolute 0.0  0.0 - 0.1 (K/uL)  COMPREHENSIVE METABOLIC PANEL     Status: Abnormal   Collection Time   09/26/11  6:48 PM      Component Value Range   Sodium 141  135 - 145 (mEq/L)   Potassium 4.0  3.5 - 5.1 (mEq/L)   Chloride 104  96 - 112 (mEq/L)   CO2 24  19 - 32 (mEq/L)   Glucose, Bld 165 (*) 70 - 99 (mg/dL)   BUN 29 (*) 6 - 23 (mg/dL)   Creatinine, Ser 1.32 (*) 0.50 - 1.10 (mg/dL)   Calcium 9.5  8.4 - 44.0 (mg/dL)   Total Protein 7.0  6.0 - 8.3 (g/dL)   Albumin 3.1 (*) 3.5 - 5.2 (g/dL)   AST 20  0 - 37 (U/L)   ALT 15  0 - 35 (U/L)   Alkaline Phosphatase 80  39 - 117 (U/L)   Total Bilirubin 0.1 (*) 0.3 - 1.2 (mg/dL)   GFR calc non Af Amer 44 (*) >90 (mL/min)   GFR  calc Af Amer 51 (*) >90 (mL/min)  BASIC METABOLIC PANEL     Status: Abnormal   Collection Time   09/27/11  6:30 AM      Component Value Range   Sodium 141  135 - 145 (mEq/L)   Potassium 4.8  3.5 - 5.1 (mEq/L)   Chloride 109  96 - 112 (mEq/L)   CO2 21  19 - 32 (mEq/L)   Glucose, Bld 138 (*) 70 - 99 (mg/dL)   BUN 26 (*) 6 - 23 (mg/dL)   Creatinine, Ser 1.61 (*) 0.50 - 1.10 (mg/dL)   Calcium 9.4  8.4 - 09.6 (mg/dL)   GFR calc non Af Amer 51 (*) >90 (mL/min)   GFR calc Af Amer 59 (*) >90 (mL/min)  CBC     Status: Abnormal   Collection Time   09/27/11  6:30 AM      Component Value Range   WBC 10.3  4.0 - 10.5 (K/uL)   RBC 3.58 (*) 3.87 - 5.11 (MIL/uL)   Hemoglobin 10.4 (*) 12.0 - 15.0 (g/dL)   HCT 04.5 (*) 40.9 - 46.0 (%)   MCV 90.2  78.0 - 100.0 (fL)   MCH 29.1  26.0 - 34.0 (pg)   MCHC 32.2  30.0 - 36.0 (g/dL)   RDW 81.1  91.4 - 78.2 (%)   Platelets 182  150 - 400 (K/uL)  BLOOD GAS, ARTERIAL     Status: Abnormal   Collection Time   09/27/11  8:30 AM      Component Value Range   FIO2 0.21     Delivery systems ROOM AIR     pH, Arterial 7.363  7.350 - 7.400    pCO2 arterial 30.1 (*) 35.0 - 45.0 (mmHg)   pO2, Arterial 84.0  80.0 - 100.0 (mmHg)   Bicarbonate 16.7  (*) 20.0 - 24.0 (mEq/L)   TCO2 17.6  0 - 100 (mmol/L)   Acid-base deficit 7.7 (*) 0.0 - 2.0 (mmol/L)   O2 Saturation 96.7     Patient temperature 98.6     Collection site RIGHT RADIAL     Drawn by [id number]     Sample type ARTERIAL DRAW     Allens test (pass/fail) PASS  PASS     Imaging results:  Dg Chest 2 View  09/25/2011  *RADIOLOGY REPORT*  Clinical Data: Chest pain with cough, congestion and shortness of breath for 4 days.  CHEST - 2 VIEW  Comparison: 02/05/2010.  Findings: The heart size and mediastinal contours are stable. There is stable biapical scarring.  No confluent airspace opacity, edema or pleural effusion is present.  Osseous structures are stable.  There are multiple surgical clips in the right upper quadrant of the abdomen which are new, probably related to prior right nephrectomy.  IMPRESSION: No acute cardiopulmonary process.  Original Report Authenticated By: Gerrianne Scale, M.D.    Assessment and Plan: I agree with the formulated Assessment and Plan with the following changes: I agree with management, she can be converted to po steroids.  She remains dyspneic with exertion this am and will need continued monitoring. ABG noted and shows a compensated respiratory alkalosis due to the increased RR.  She will need inhalers at home and with cardiac findings, would benefit from a BP med due to the LVH on EKG.

## 2011-09-27 NOTE — Progress Notes (Signed)
Subjective: Patient reports that she had increased cough and wheezing with moderate whitish sputum. Dyspnea with exertion. No pain or other discomfort.  Patient states that she has not had BM since Thursday, 09/25/2011.   Patient states that her grandson committed suicide and his funeral is today at [place]. Patient is very upset and crying. Supportive conversation given. Chaplin care requested. Objective: Vital signs in last 24 hours: Filed Vitals:   09/26/11 2037 09/27/11 0001 09/27/11 0506 09/27/11 0814  BP:  101/46 97/59   Pulse: 96 92 93   Temp:  97.9 F (36.6 C) 98.2 F (36.8 C)   TempSrc:  Oral Oral   Resp: 22 21 20    Height:      Weight:      SpO2: 96% 98% 91% 94%   Weight change:  No intake or output data in the 24 hours ending 09/27/11 1047 Physical Exam: Mild distress due to cough and wheezing. Anxious appearing. General: alert, well-developed, and cooperative to examination.  Head: normocephalic and atraumatic.  Eyes: vision grossly intact, pupils equal, pupils round, pupils reactive to light, no injection and anicteric.  Mouth: pharynx pink and moist, no erythema, and no exudates.  Neck: supple, full ROM, no thyromegaly, no JVD, and no carotid bruits.  Lungs: Coarse breath sounds, upper airway noises, Ant. And Post. bilateral expiratory wheezing with occasional rhonchi noted. No rales.  Heart: RRR. 3/6 systollic murmur noted.  Abdomen: soft, non-tender, normal bowel sounds, no distention, no guarding, no rebound tenderness, no hepatomegaly, and no splenomegaly.  Msk: no joint swelling, no joint warmth, and no redness over joints.  Pulses: 2+ DP/PT pulses bilaterally Extremities: No cyanosis, clubbing, edema Neurologic: alert & oriented X3, cranial nerves II-XII intact, strength normal in all extremities, sensation intact to light touch, and gait normal.  Skin: turgor normal and no rashes.  Psych: Patient is anxious and emotional disturbed due to his grandson's funeral at [facility]  today.  Lab Results: Basic Metabolic Panel:  Lab 09/27/11 1610 09/26/11 1848  Katie [name] 141 141  K 4.8 4.0  CL 109 104  CO2 21 24  GLUCOSE 138* 165*  BUN 26* 29*  CREATININE 1.12* 1.26*  CALCIUM 9.4 9.5  MG -- --  PHOS -- --   Liver Function Tests:  Lab 09/26/11 1848 09/26/11 0942  AST 20 20  ALT 15 14  ALKPHOS 80 94  BILITOT 0.1* 0.1*  PROT 7.0 7.7  ALBUMIN 3.1* 3.2*   CBC:  Lab 09/27/11 0630 09/26/11 1848 09/26/11 0902  WBC 10.3 7.9 --  NEUTROABS -- 7.0 1.6*  HGB 10.4* 11.0* --  HCT 32.3* 33.3* --  MCV 90.2 88.8 --  PLT 182 175 --     Misc. Labs: arterial blood gases  FIO2    0.21 Room air PH:     7.363 (7.35-7.40) PCO2  30.1  (35-45) PO2  84        (80-100) Bicarb: 16.7  (20-24) TCO2 17.6   (0-100) O2Sat: 96.7%  2D Echo: Pending  Micro Results: Stool culture needs to collect.  Studies/Results: Dg Chest 2 View  09/25/2011  *RADIOLOGY REPORT*  Clinical Data: Chest pain with cough, congestion and shortness of breath for 4 days.  CHEST - 2 VIEW  Comparison: 02/05/2010.  Findings: The heart size and mediastinal contours are stable. There is stable biapical scarring.  No confluent airspace opacity, edema or pleural effusion is present.  Osseous structures are stable.  There are multiple surgical clips in the right upper quadrant of  the abdomen which are new, probably related to prior right nephrectomy.  IMPRESSION: No acute cardiopulmonary process.  Original Report Authenticated By: Gerrianne Scale, M.D.   Medications: I have reviewed the patient's current medications. Scheduled Meds:   . albuterol  2.5 mg Nebulization QID  . albuterol      . albuterol      . docusate sodium  100 mg Oral BID  . doxycycline  100 mg Oral Q12H  . enoxaparin  40 mg Subcutaneous Q24H  . Fluticasone-Salmeterol  1 puff Inhalation BID  . influenza  inactive virus vaccine  0.5 mL Intramuscular Tomorrow-1000  . ipratropium      . ipratropium      . ipratropium  0.5 mg  Nebulization Once  . ipratropium  0.5 mg Nebulization Q6H  . nicotine  21 mg Transdermal Daily  . pneumococcal 23 valent vaccine  0.5 mL Intramuscular Tomorrow-1000  . potassium chloride  40 mEq Oral Once  . predniSONE  40 mg Oral Daily  . DISCONTD: albuterol  2.5 mg Nebulization Q4H  . DISCONTD: ipratropium  0.5 mg Nebulization Q4H  . DISCONTD: methylPREDNISolone (SOLU-MEDROL) injection  80 mg Intravenous TID  . DISCONTD: prednisoLONE  40 mg Oral Daily  . DISCONTD: tiotropium  18 mcg Inhalation Daily   Continuous Infusions:   . sodium chloride     PRN Meds:.acetaminophen, albuterol, albuterol, benzonatate, guaiFENesin-codeine, ondansetron (ZOFRAN) IV, DISCONTD: albuterol Assessment/Plan:  # COPD exacerbation  Patient presents with 5 day history of productive cough, wheezing and SOB. Given her h/o COPD, the clinical manifestation is consistent with COPD exacerbation. ABG indicates compensated Rep alkalosis. Of note, patient has low WBC and ANC and she has had normal white count in the past. This is concerning for infectious etiology.   Pneumonia is unlikely since patient is afebrile and has negative CXR with no Leukocytosis. PE is also unlikely as Geneva score shows very low probability.   Plan - continue symptomatic treatment for cough, wheezing and sputum. - Continue the steroids treatment. IV steroid is stopped and oral prednisone is initiated today. - Continue the ABX treatment with Doxycycline 100 mg po BID  - Continue the Neb treatment and Spriva for maintenance  - disposition requirement    #1. Pt will need the assessment for home O2 once her acute problem is resolved.    #2. Pt will need to be discharged on albuterol and Spiriva.    #3. Patient will need to set up her PCP for outpatient PFT.  #  Diarrhea - resolved. Patient has not had any BM for two days.                      Exact cause unknown. Suspect viral gastroenteritis. C Diff unlikely given no recent abx use.   -  stool workup pending.  # Hypokalemia, resolved.    Mild hypokalemia is noted on admission and quickly resolved after K replacement.   The etiology is likely due to GI loss 2/2 diarrhea and using of Beta agonist.    - will continue to monitor.   # Heart  Murmur On physical exam patient has a distinct holosystolic murmur concerning for aortic stenosis. Additionally patient provides history of syncopal episodes with two recent episodes over the last 2 months. Patient reports she "just fell out."   Plan:  -Pending 2D Echo with contrast for further evaluation - Will consider cardiology consult if indicated by Echo   # History of HTN -  BP currently WNL. Will monitor for now.   DVT PPX - Lovenox      LOS: 2 days   Katie [name] 09/27/2011, 10:47 AM

## 2011-09-28 DIAGNOSIS — R011 Cardiac murmur, unspecified: Secondary | ICD-10-CM

## 2011-09-28 LAB — PROTIME-INR
INR: 0.92 (ref 0.00–1.49)
Prothrombin Time: 12.6 seconds (ref 11.6–15.2)

## 2011-09-28 MED ORDER — ZOLPIDEM TARTRATE 5 MG PO TABS
5.0000 mg | ORAL_TABLET | Freq: Every evening | ORAL | Status: DC | PRN
  Administered 2011-09-28 – 2011-09-30 (×3): 5 mg via ORAL
  Filled 2011-09-28 (×3): qty 1

## 2011-09-28 NOTE — Progress Notes (Signed)
Patient had a small amount of blood tinged frothy sputum this morning at around 0655, and she stated that she had never had that happen before.  I assured her that a progress note would be written and the am nurse would be informed.  Will continue to monitor patient. Naliya Gish, Joan Mayans, RN

## 2011-09-28 NOTE — Progress Notes (Signed)
Subjective: Pt feeling much better today.  Reports some sputum production with a small amount of dark/bloody colored sputum.  Denies c/p, syncope, or other complaint.  No acute events overnight.  Objective: Vital signs in last 24 hours: Filed Vitals:   09/27/11 2109 09/28/11 0321 09/28/11 0608 09/28/11 0743  BP:   135/81   Pulse:   85   Temp:   97.9 F (36.6 C)   TempSrc:   Oral   Resp:   19   Height:      Weight:      SpO2: 96% 97% 100% 98%   Weight change:   Intake/Output Summary (Last 24 hours) at 09/28/11 1004 Last data filed at 09/28/11 0700  Gross per 24 hour  Intake    440 ml  Output      2 ml  Net    438 ml    Physical Exam: VItal signs reviewed and stable. GEN: No apparent distress.  Alert and oriented x 3.  Pleasant, conversant, and cooperative to exam. HEENT: head is autraumatic and normocephalic.  Neck is supple without palpable masses or lymphadenopathy.  No JVD or carotid bruits.  Vision intact.  EOMI.  PERRLA.  Sclerae anicteric.  Conjunctivae without pallor or injection. Mucous membranes are moist.  Oropharynx is without erythema, exudates, or other abnormal lesions. RESP:  Lungs are clear to ascultation bilaterally with fair air movement.  Mild expiratory wheezing noted in LUL field. No ronchi, or rubs. CARDIOVASCULAR: regular rate, normal rhythm.  Clear S1, S2, no murmurs, gallops, or rubs. ABDOMEN: soft, non-tender, non-distended.  Bowels sounds present in all quadrants and normoactive.  No palpable masses. EXT: warm and dry.  Peripheral pulses equal, intact, and +2 globally.  No clubbing or cyanosis.   SKIN: warm and dry with normal turgor.  No rashes or abnormal lesions observed. NEURO: CN II-XII grossly intact.  Muscle strength +5/5 in bilateral upper and lower extremities.  Sensation is grossly intact.  No focal deficit.  Lab Results: Basic Metabolic Panel:  Lab 09/27/11 0981 09/26/11 1848  NA 141 141  K 4.8 4.0  CL 109 104  CO2 21 24  GLUCOSE 138*  165*  BUN 26* 29*  CREATININE 1.12* 1.26*  CALCIUM 9.4 9.5  MG -- --  PHOS -- --  CBC:  Lab 09/27/11 0630 09/26/11 1848 09/26/11 0902  WBC 10.3 7.9 --  NEUTROABS -- 7.0 1.6*  HGB 10.4* 11.0* --  HCT 32.3* 33.3* --  MCV 90.2 88.8 --  PLT 182 175 --    Medications: I have reviewed the patient's current medications.  Assessment/Plan: #Acute COPD exacerbation: pt continues to improve but continues to have some decreased air movement an wheezing on exam. - continue pred - continue albuterol/atrovent nebs - continue doxy - will need albuterol inhaler and spiriva on d/c home - will need to establish PCP and may benefit from formal PFT testing  #Hemoptysis: pt expectorated a very small amount of pink/maroon tinged sputum; no bright red blood.  She is not experiencing any respiratory distress of signiificant hemoptysis; hemoptysis can occur with PNA and acute bronchitis.  She does not have any known risk for TB and is hemodynamically stable. - continue with doxycycline and other treatment outlined above - check coags and CBC in am  Hypokalemia: K has remained wnl following repletion.  Dispo: anitipate d/c home in am   Katie [name] 09/28/2011, 10:04 AM

## 2011-09-29 ENCOUNTER — Encounter (HOSPITAL_COMMUNITY): Payer: Self-pay | Admitting: Cardiology

## 2011-09-29 DIAGNOSIS — I340 Nonrheumatic mitral (valve) insufficiency: Secondary | ICD-10-CM | POA: Diagnosis present

## 2011-09-29 DIAGNOSIS — I059 Rheumatic mitral valve disease, unspecified: Secondary | ICD-10-CM

## 2011-09-29 LAB — CBC
MCHC: 32.7 g/dL (ref 30.0–36.0)
RDW: 13.1 % (ref 11.5–15.5)

## 2011-09-29 LAB — BASIC METABOLIC PANEL
BUN: 25 mg/dL — ABNORMAL HIGH (ref 6–23)
Creatinine, Ser: 1.16 mg/dL — ABNORMAL HIGH (ref 0.50–1.10)
GFR calc Af Amer: 57 mL/min — ABNORMAL LOW (ref >90)
GFR calc non Af Amer: 49 mL/min — ABNORMAL LOW (ref >90)
Potassium: 3.9 mEq/L (ref 3.5–5.1)

## 2011-09-29 MED ORDER — INFLUENZA VIRUS VACC SPLIT PF IM SUSP
0.5000 mL | Freq: Once | INTRAMUSCULAR | Status: DC
  Filled 2011-09-29: qty 0.5

## 2011-09-29 MED ORDER — SODIUM CHLORIDE 0.9 % IV SOLN: 250.0000 mL | INTRAVENOUS | Status: DC | PRN

## 2011-09-29 MED ORDER — PNEUMOCOCCAL VAC POLYVALENT 25 MCG/0.5ML IJ INJ
0.5000 mL | INJECTION | Freq: Once | INTRAMUSCULAR | Status: DC
  Filled 2011-09-29: qty 0.5

## 2011-09-29 MED ORDER — SODIUM CHLORIDE 0.9 % IJ SOLN
3.0000 mL | Freq: Two times a day (BID) | INTRAMUSCULAR | Status: DC
  Administered 2011-09-29 – 2011-10-01 (×5): 3 mL via INTRAVENOUS

## 2011-09-29 MED ORDER — SODIUM CHLORIDE 0.9 % IJ SOLN: 3.0000 mL | INTRAMUSCULAR | Status: DC | PRN

## 2011-09-29 NOTE — Progress Notes (Signed)
Noted referral for assistance with medications, however pt now for cardiac cath tomorrow 09/30/2011, will continue to follow for d/c needs.  Johny Shock RN MPH Case Manager [phone]

## 2011-09-29 NOTE — Consults (Signed)
Admit date: 09/25/2011 Referring Physician  Dr. Staci Righter Primary Physician  none Primary Cardiologist  Dr. Armanda Magic Reason for Consultation  severe mitral valve prolapse with mitral regurgitation  HPI: This is a 65 year old female with a history of COPD and hypertension and a diagnosis of a heart murmur in the past but has not been worked up who presented to the hospital with complaints of shortness of breath and cough for 5 days. She was diagnosed with acute COPD exacerbation. She was treated with antibiotics and inhalers. A murmur was heard on physical exam and a 2-D echocardiogram was obtained. The echocardiogram showed severe mitral valve prolapse with wide open severe mitral regurgitation of note the patient gave a history on admission that she had several syncopal episodes over the past 2 months where she "just fell out". She denies any chest pain, palpitations. She states that  she has been short of breath for a long time all the time but over the past 5 days had worsened.     PMH:   Past Medical History  Diagnosis Date  . Arthritis   . COPD (chronic obstructive pulmonary disease)     no history of PFTs, only diagnosed by CXR, not on any inhalers because she has not had a PCP in over 2 years  . Hypertension     previously on Lisinopril/HCTZ  . History of heartburn   . Hypercholesteremia     previously on pravastatin   . Cholelithiasis 2011    s/p cholescystectomy   . Renal cell carcinoma 01/2010    s/p right radical nephrectomy 01/2010,  followed by [facility]  . Murmur     no  prior work-up  . GERD (gastroesophageal reflux disease)   . Emphysema 2010    dx'd/CXR  . Shortness of breath 09/26/11    "lately all the time"  . Pneumonia 1980's    "walking"     PSH:   Past Surgical History  Procedure Date  . Cholecystectomy 01/2010  . Nephrectomy radical 01/2010    right   . Tubal ligation 1970's  . Diagnostic laparoscopy   . Ovarian cyst removal 1970's   "went through belly button"  . Appendectomy 1970's    Allergies:  Review of patient's allergies indicates no known allergies. Prior to Admit Meds:   Prescriptions prior to admission  Medication Sig Dispense Refill  . aspirin-sod bicarb-citric acid (ALKA-SELTZER) 325 MG TBEF Take 325 mg by mouth every 6 (six) hours as needed. For upset stomach       . guaiFENesin (MUCINEX) 600 MG 12 hr tablet Take 1,200 mg by mouth 2 (two) times daily.         Fam HX:    Family History  Problem Relation Age of Onset  . COPD Mother     DECEASED   Social HX:    History   Social History  . Marital Status: Married    Spouse Name: N/A    Number of Children: 3  . Years of Education: 12   Occupational History  . Futures trader business   Social History Main Topics  . Smoking status: Current Everyday Smoker -- 1.0 packs/day for 50 years    Types: Cigarettes  . Smokeless tobacco: Never Used   Comment: smoking cessation consult entered  . Alcohol Use: Yes     09/26/11 "last cocaine use ~ 2006; drink very rarely"  . Drug Use: Yes    Special: Cocaine  .  Sexually Active: No   Other Topics Concern  . Not on file   Social History Narrative   Lives in [place] with her husband. 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 Patient has 3 grown children that do not live with her. Patient does not have any medical insurance.     ROS:  All 11 ROS were addressed and are negative except what is stated in the HPI  Physical Exam: Blood pressure 113/66, pulse 99, temperature 98.8 F (37.1 C), temperature source Oral, resp. rate 19, height 5\' 1"  (1.549 m), weight 66.8 kg (147 lb 4.3 oz), SpO2 98.00%.    General: Well developed, well nourished, in no acute distress Head: Eyes PERRLA, No xanthomas.   Normal cephalic and atramatic  Lungs:  Scattered rhonchi and wheezes. Heart:   HRRR S1 S2 Pulses are 2+ & equal.Loud midsystolic click at apex with 1/6 SM  intermittent            No carotid bruit. No JVD.  No abdominal bruits. No femoral bruits. Abdomen: Bowel sounds are positive, abdomen soft and non-tender without masse sExtremities:   No clubbing, cyanosis or edema.  DP +1 Neuro: Alert and oriented X 3. Psych:  Good affect, responds appropriately    Labs:   Lab Results  Component Value Date   WBC 9.1 09/29/2011   HGB 10.3* 09/29/2011   HCT 31.5* 09/29/2011   MCV 89.2 09/29/2011   PLT 173 09/29/2011    Lab 09/29/11 0640 09/26/11 1848  NA 139 --  K 3.9 --  CL 105 --  CO2 26 --  BUN 25* --  CREATININE 1.16* --  CALCIUM 9.3 --  PROT -- 7.0  BILITOT -- 0.1*  ALKPHOS -- 80  ALT -- 15  AST -- 20  GLUCOSE 87 --   No results found for this basename: PTT   Lab Results  Component Value Date   INR 0.92 09/28/2011   INR 0.96 02/05/2010       Radiology: *RADIOLOGY REPORT*  Clinical Data: Chest pain with cough, congestion and shortness of  breath for 4 days.  CHEST - 2 VIEW  Comparison: 02/05/2010.  Findings: The heart size and mediastinal contours are stable.  There is stable biapical scarring. No confluent airspace opacity,  edema or pleural effusion is present. Osseous structures are  stable. There are multiple surgical clips in the right upper  quadrant of the abdomen which are new, probably related to prior  right nephrectomy.  IMPRESSION:  No acute cardiopulmonary process.  Original Report Authenticated By: Gerrianne Scale, M.D.   EKG:  NSR with LVH by voltage criteria  ASSESSMENT:  1.  Mitral valve prolapse with severe wide open MR by echo 2.  Recent syncopal episodes 3.  Chronic SOB for months with recent COPD exacerbation but SOB has been going on for a while with acute worsening recently 4.  COPD exacerbation resolved  PLAN:   1.  Right and left cardiac catheterization by Dr. Anne Fu 12/11 to rule out CAD and assess pulmonary artery pressures 2.  Will need TEE prior to MV surgery to assess suitability  for MV repair - may be a candidate for minimally invasive procedure 3.  Will consult Dr. Barry Dienes with CVTS  Quintella Reichert, MD  09/29/2011  11:37 AM

## 2011-09-29 NOTE — Progress Notes (Signed)
Peak flow done post Nebulizer treatment after order acknowledged, (-240 lpm), great effort.

## 2011-09-29 NOTE — Progress Notes (Signed)
Subjective: Patient feels much better today, mild productive cough with some dark and ywllowllish sputum.  No there concerns and complaints. Good appetite and sleeping wells. She states that she feels much better after she and her family talked and prayed with Chaplin.  Objective: Vital signs in last 24 hours: Filed Vitals:   09/28/11 2130 09/28/11 2216 09/29/11 0619 09/29/11 0625  BP:  133/74 149/90 113/66  Pulse: 89 93 84 99  Temp:  98 F (36.7 C) 97.9 F (36.6 C) 98.8 F (37.1 C)  TempSrc:  Oral Oral Oral  Resp: 18 18 18 19   Height:      Weight:      SpO2:  97% 99% 91%   Weight change:   Intake/Output Summary (Last 24 hours) at 09/29/11 0933 Last data filed at 09/28/11 1357  Gross per 24 hour  Intake    240 ml  Output      0 ml  Net    240 ml   Physical Exam:  General: alert, well-developed, and cooperative to examination.  Head: normocephalic and atraumatic.  Eyes: vision grossly intact, pupils equal, pupils round, pupils reactive to light, no injection and anicteric.  Mouth: pharynx pink and moist, no erythema, and no exudates.  Neck: supple, full ROM, no thyromegaly, no JVD, and no carotid bruits.  Lungs: Bibasilar lung sounds diminished with occasional anterior and posterior expiratory wheezing noted, which is much better comparing to her lung assessment on admissino. No rales or rhonchi noted.   Heart: RRR. 3/6 systollic murmur noted.  Abdomen: soft, non-tender, normal bowel sounds, no distention, no guarding, no rebound tenderness, no hepatomegaly, and no splenomegaly.  Msk: no joint swelling, no joint warmth, and no redness over joints.  Pulses: 2+ DP/PT pulses bilaterally Extremities: No cyanosis, clubbing, edema Neurologic: alert & oriented X3, cranial nerves II-XII intact, strength normal in all extremities, sensation intact to light touch, and gait normal.  Skin: turgor normal and no rashes.  Psych: Patient is anxious and emotional disturbed due to his  grandson's funeral at [facility] today.  Lab Results: Basic Metabolic Panel:  Lab 09/29/11 4098 09/27/11 0630  Katie [name] 139 141  K 3.9 4.8  CL 105 109  CO2 26 21  GLUCOSE 87 138*  BUN 25* 26*  CREATININE 1.16* 1.12*  CALCIUM 9.3 9.4  MG -- --  PHOS -- --   Liver Function Tests:  Lab 09/26/11 1848 09/26/11 0942  AST 20 20  ALT 15 14  ALKPHOS 80 94  BILITOT 0.1* 0.1*  PROT 7.0 7.7  ALBUMIN 3.1* 3.2*   CBC:  Lab 09/29/11 0640 09/27/11 0630 09/26/11 1848 09/26/11 0902  WBC 9.1 10.3 -- --  NEUTROABS -- -- 7.0 1.6*  HGB 10.3* 10.4* -- --  HCT 31.5* 32.3* -- --  MCV 89.2 90.2 -- --  PLT 173 182 -- --   Coagulation:  Lab 09/28/11 1049  LABPROT 12.6  INR 0.92   2D echo  Study Conclusions  - Left ventricle: The cavity size was normal. Wall thickness was increased in a pattern of moderate LVH. Systolic function was normal. The estimated ejection fraction was in the range of 55% to 60%. - Mitral valve: Mitral valve is thickened with SAM. Likely prolapse of posterior leaflet with severe MR. HOCM likely physiology present. Significant LVOT gradient that is not well quantified - Atrial septum: No defect or patent foramen ovale was identified. - Impressions: Likely flail or severely prolapsing posterior leaflet with severe MR and HOCM physiology. SAM  with LVOT gradient not well defined   Medications: I have reviewed the patient's current medications. Scheduled Meds:    . albuterol  2.5 mg Nebulization Q6H  . docusate sodium  100 mg Oral BID  . doxycycline  100 mg Oral Q12H  . enoxaparin  40 mg Subcutaneous Q24H  . feeding supplement  1 Container Oral BID BM  . Fluticasone-Salmeterol  1 puff Inhalation BID  . guaiFENesin  600 mg Oral BID  . ipratropium  0.5 mg Nebulization Q6H  . nicotine  21 mg Transdermal Daily  . predniSONE  40 mg Oral Daily   Continuous Infusions:  PRN Meds:.acetaminophen, albuterol, benzonatate, guaiFENesin-codeine, ondansetron (ZOFRAN) IV,  zolpidem, DISCONTD: ALPRAZolam Assessment/Plan:  # COPD exacerbation  Patient presents with 5 day history of productive cough, wheezing and SOB. Given her h/o COPD, the clinical manifestation is consistent with COPD exacerbation. ABG indicates compensated Rep alkalosis. Of note, patient has low WBC and ANC and she has had normal white count in the past. This is concerning for infectious etiology.  Pneumonia is unlikely since patient is afebrile and has negative CXR with no Leukocytosis. PE is also unlikely as Geneva score shows very low probability.   Plan  - continue steroids prednisone  - Continue the ABX treatment with Doxycycline 100 mg po BID  - Continue the Neb treatment and Spriva for maintenance  - Assessment for home O2 requirement today.  - disposition:  #1 will likely discharge her today. #2.Patient will need to continue Doxycycline and prednisone tapering dose. She will need albuterol and Spiriva inhalers along with Albuterol and Atrovent Neb treatment.  #3. I will set up her PCP through our Clinic for outpatient management and outpatient PFT.  # Hemoptysis: pt expectorated a very small amount of pink/maroon tinged sputum yesterday; no bright red blood. She is not experiencing any respiratory distress of signiificant hemoptysis; hemoptysis can occur with PNA and acute bronchitis. She does not have any known risk for TB and is hemodynamically stable. Her Coag studies are WNL, and her CBC is stable. - continue with doxycycline and other treatment outlined above    # Diarrhea - resolved. Patient has not had any diarrhea since admission Exact cause unknown. Suspect viral gastroenteritis. C Diff unlikely given no recent abx use.   # Hypokalemia, resolved.  Mild hypokalemia is noted on admission and quickly resolved after K replacement.  The etiology is likely due to GI loss 2/2 diarrhea and using of Beta agonist.  - will continue to monitor.   # Heart Murmur  Patient has been  asymptomatic since admission. On physical exam patient has a distinct holosystolic murmur. Additionally patient provides history of syncopal episodes with two recent episodes over the last 2 months. Patient reports she "just fell out."  2 D Echo indicates "  Impressions: Likely flail or severely prolapsing posterior leaflet with severe MR and HOCM physiology. SAM with LVOT gradient not well defined".  Plan:  - pending  [facility] Cardiology consult.  # History of HTN -  BP currently WNL. Will monitor for now.   DVT PPX - Lovenox       LOS: 4 days   Katie [name] 09/29/2011, 9:33 AM

## 2011-09-29 NOTE — Progress Notes (Signed)
Internal Medicine Teaching Service Attending Note Date: 09/29/2011  Patient name: Katie [name]  Medical record number: [id number]  Date of birth: [birth year]    This patient has been seen and discussed with the house staff. Please see their note for complete details. I concur with their findings.  Patient with significant findings on echo and to get a cath and evaluation by [facility] for ? MVR.  Appreciate cardiology input.    COMER, ROBERT 09/29/2011, 2:20 PM

## 2011-09-29 NOTE — Consult Note (Signed)
CARDIOTHORACIC SURGERY CONSULTATION REPORT  PCP is No primary provider on file. Attending Provider is Katie Righter, MD Referring Provider is Traci R. Mayford Knife, MD   Reason for consultation:  Mitral regurgitation  HPI:  Patient is a 65 year old married Hispanic female from [place] with reported history of heart murmur and a remote history of rheumatic fever during her youth. The patient has long-standing history of tobacco abuse as well as known history of chronic obstructive pulmonary disease and hypertension.  She has no previous history of congestive heart failure. The patient describes a several month history of worsening exertional shortness of breath and atypical chest pain. She states that for months she has noted that she gets short of breath with activity and this has gradually gotten worse. She describes a dull aching pain that comes and goes sporadically and radiates across her chest. The pain is not necessarily associated with shortness of breath and is typically not associated with physical activity. The pain is usually transient and short lived.  Over the last week prior to admission the patient developed worsening shortness of breath and cough. She states that the cough initially was productive of thin filmy clear sputum. The shortness of breath became more pronounced ultimately prompting her to present on December 7. She was admitted to the hospital and treated for acute exacerbation of chronic obstructive pulmonary disease. An echocardiogram was obtained because of her heart murmur. This was found to demonstrate severe mitral regurgitation. The patient was seen in consultation by Dr. Mayford Knife, and elective left and right heart catheterization has been scheduled for tomorrow. Cardiothoracic surgical consultation was requested.  Past Medical History  Diagnosis Date  . Arthritis   . COPD (chronic obstructive pulmonary disease)     no history of PFTs, only diagnosed by CXR, not  on any inhalers because she has not had a PCP in over 2 years  . Hypertension     previously on Lisinopril/HCTZ  . History of heartburn   . Hypercholesteremia     previously on pravastatin   . Cholelithiasis 2011    s/p cholescystectomy   . Renal cell carcinoma 01/2010    s/p right radical nephrectomy 01/2010,  followed by [facility]  . Murmur     no  prior work-up  . GERD (gastroesophageal reflux disease)   . Emphysema 2010    dx'd/CXR  . Shortness of breath 09/26/11    "lately all the time"  . Pneumonia 1980's    "walking"    Past Surgical History  Procedure Date  . Cholecystectomy 01/2010  . Nephrectomy radical 01/2010    right   . Tubal ligation 1970's  . Diagnostic laparoscopy   . Ovarian cyst removal 1970's    "went through belly button"  . Appendectomy 1970's    Family History  Problem Relation Age of Onset  . COPD Mother     DECEASED    Social History History  Substance Use Topics  . Smoking status: Current Everyday Smoker -- 1.0 packs/day for 50 years    Types: Cigarettes  . Smokeless tobacco: Never Used   Comment: smoking cessation consult entered  . Alcohol Use: Yes     09/26/11 "last cocaine use ~ 2006; drink very rarely"    Current Facility-Administered Medications  Medication Dose Route Frequency Provider Last Rate Last Dose  . 0.9 %  sodium chloride infusion  250 mL Intravenous PRN Katie Reichert, MD      .  acetaminophen (TYLENOL) tablet 650 mg  650 mg Oral Q6H PRN Katie [name]      . albuterol (PROVENTIL) (5 MG/ML) 0.5% nebulizer solution 2.5 mg  2.5 mg Nebulization Q6H Katie [name]   2.5 mg at 09/29/11 1508  . albuterol (PROVENTIL) (5 MG/ML) 0.5% nebulizer solution 2.5 mg  2.5 mg Nebulization Q2H PRN Katie [name]      . benzonatate (TESSALON) capsule 100 mg  100 mg Oral TID PRN Katie [name]   100 mg at 09/28/11 0734  . docusate sodium (COLACE) capsule 100 mg  100 mg Oral BID Katie [name]   100 mg at 09/28/11 2103  . doxycycline  (VIBRA-TABS) tablet 100 mg  100 mg Oral Q12H Katie [name]   100 mg at 09/29/11 1046  . feeding supplement (ENSURE) pudding 1 Container  1 Container Oral BID BM Katie Query, MD   1 Container at 09/29/11 1445  . Fluticasone-Salmeterol (ADVAIR) 250-50 MCG/DOSE inhaler 1 puff  1 puff Inhalation BID Katie [name]   1 puff at 09/29/11 0942  . guaiFENesin (MUCINEX) 12 hr tablet 600 mg  600 mg Oral BID Katie Li, MD   600 mg at 09/29/11 1046  . guaiFENesin-codeine 100-10 MG/5ML solution 5 mL  5 mL Oral Q6H PRN Katie [name]      . ipratropium (ATROVENT) nebulizer solution 0.5 mg  0.5 mg Nebulization Q6H Katie [name]   0.5 mg at 09/29/11 1509  . nicotine (NICODERM CQ - dosed in mg/24 hours) patch 21 mg  21 mg Transdermal Daily Katie [name]   21 mg at 09/29/11 1048  . ondansetron (ZOFRAN) injection 4 mg  4 mg Intravenous Q6H PRN Katie [name]      . predniSONE (DELTASONE) tablet 40 mg  40 mg Oral Daily Katie [name]   40 mg at 09/29/11 1046  . sodium chloride 0.9 % injection 3 mL  3 mL Intravenous Q12H Katie Reichert, MD   3 mL at 09/29/11 1410  . sodium chloride 0.9 % injection 3 mL  3 mL Intravenous PRN Katie Reichert, MD      . zolpidem (AMBIEN) tablet 5 mg  5 mg Oral QHS PRN Katie [name]   5 mg at 09/28/11 2104  . DISCONTD: ALPRAZolam Prudy Feeler) tablet 0.25 mg  0.25 mg Oral TID PRN Katie Li, MD   0.25 mg at 09/27/11 2300  . DISCONTD: enoxaparin (LOVENOX) injection 40 mg  40 mg Subcutaneous Q24H Katie [name]   40 mg at 09/29/11 1046  . DISCONTD: influenza  inactive virus vaccine (FLUZONE/FLUARIX) injection 0.5 mL  0.5 mL Intramuscular Once Katie Righter, MD      . DISCONTD: pneumococcal 23 valent vaccine (PNU-IMMUNE) injection 0.5 mL  0.5 mL Intramuscular Once Katie Righter, MD        No Known Allergies  Review of Systems:  General:  normal appetite, normal energy, no recent weight gain nor weight loss  Respiratory:  + cough, + wheezing, + hemoptysis (small amount blood tinged sputum), no pain with inspiration or  cough, shortness of breath with exertion, all symptoms improved since admissioin  Cardiac:   + chest pain unrelated to exertion, + exertional SOB, no resting SOB, no PND, no orthopnea, no LE edema, no palpitations, no syncope but + dizzy spells  GI:   no difficulty swallowing, no hematochezia, no hematemesis, no melena, no constipation, some diarrhea on admission, now resolved  GU:   no dysuria, no urgency, no frequency   Musculoskeletal: + arthritis fingers of both hands, no  arthralgia   Vascular:  no pain suggestive of claudication   Neuro:   no symptoms suggestive of TIA's, no seizures, no headaches, no peripheral neuropathy   Endocrine:  Negative   HEENT:  Poor dentition with some loose teeth,  no recent vision changes  Psych:   no anxiety, no depression    Physical Exam:   BP 107/78  Pulse 105  Temp(Src) 98.4 F (36.9 C) (Oral)  Resp 20  Ht 5\' 1"  (1.549 m)  Wt 66.8 kg (147 lb 4.3 oz)  BMI 27.83 kg/m2  SpO2 98%  General:    well-appearing  HEENT:  Unremarkable other than poor dentition  Neck:   no JVD, no bruits, no adenopathy   Chest:   + few crackles, symmetrical breath sounds, + few wheezes, no rhonchi   CV:   RRR, soft systolic murmur at LSB, + click  Abdomen:  soft, non-tender, no masses   Extremities:  warm, well-perfused, pulses diminished R>L  Rectal/GU  Deferred  Neuro:   Grossly non-focal and symmetrical throughout  Skin:   Clean and dry, no rashes, no breakdown  Diagnostic Tests:  ------------------------------------------------------------ Transthoracic Echocardiography  Patient: Katie, [name] MR #: [id number] Study Date: 09/28/2011 Gender: F Age: 31 Height: 154.9cm Weight: 65.8kg BSA: 1.74m^2 Pt. Status: Room: 5504  PERFORMING [place] Katie [name] SONOGRAPHER Katie [name] cc:  ------------------------------------------------------------ LV EF: 55% -  60%  ------------------------------------------------------------ Indications: Murmur 785.2.  ------------------------------------------------------------ History: PMH: Chronic obstructive pulmonary disease. Risk factors: Hypertension. Dyslipidemia.  ------------------------------------------------------------ Study Conclusions  - Left ventricle: The cavity size was normal. Wall thickness was increased in a pattern of moderate LVH. Systolic function was normal. The estimated ejection fraction was in the range of 55% to 60%. - Mitral valve: Mitral valve is thickened with SAM. Likely prolapse of posterior leaflet with severe MR. HOCM likely physiology present. Significant LVOT gradient that is not well quantified - Atrial septum: No defect or patent foramen ovale was identified. - Impressions: Likely flail or severely prolapsing posterior leaflet with severe MR and HOCM physiology. SAM with LVOT gradient not well defined Impressions:  - Likely flail or severely prolapsing posterior leaflet with severe MR and HOCM physiology. SAM with LVOT gradient not well defined   Impression:  65 year old female with what appears to be severe mitral regurgitation, possible dynamic left ventricular output tract obstruction, possible aortic insufficiency, and normal left ventricular function. Patient has a reported history of rheumatic fever in the remote past. Patient describes a several month history of worsening symptoms of exertional shortness of breath as well as recent development of cough productive of thin filmy sputum suspicious for the development of congestive heart failure with or without concomitant acute exacerbation of chronic obstructive pulmonary disease.  In my opinion the images from transthoracic echocardiogram are not sufficient for definitive diagnosis with respect to the underlying etiology of mitral valve disease, the likelihood of possible mitral valve repair, and the  possibility of significant ongoing dynamic left ventricular output tract obstruction and/or aortic insufficiency. I recommend transesophageal echocardiogram to support these matters. I agree with plans for left and right heart catheterization. I would recommend imaging the descending thoracic, abdominal aorta, and iliac vessels at the time of catheterization to evaluate the possibility of significant aortoiliac occlusive disease. The patient will need formal pulmonary function tests performed. The patient needs a dental consult.   Plan:  We will continue to follow and await for further diagnostic testing as recommended. We will ask for dental service consult. Much  of this workup could be performed as an outpatient as desired.    Salvatore Decent. Cornelius Moras, MD 09/29/2011 6:25 PM

## 2011-09-29 NOTE — Progress Notes (Signed)
Appreciate Dr. Mayford Knife consult and recommendations.  I have talked to Dr. Mayford Knife from [facility] cardiology who consulted patient today. The plan is to perform Left and right cardiac cath in am. Dr. Mayford Knife has also contacted [facility] for surgical evaluation. I will stop Lovenox. And pre-cath orders will be entered by Dr. Mayford Knife.

## 2011-09-30 ENCOUNTER — Inpatient Hospital Stay (HOSPITAL_COMMUNITY): Payer: Self-pay

## 2011-09-30 ENCOUNTER — Other Ambulatory Visit (HOSPITAL_COMMUNITY): Payer: Self-pay | Admitting: Radiology

## 2011-09-30 ENCOUNTER — Encounter (HOSPITAL_COMMUNITY)
Admission: EM | Disposition: A | Discharge status: Home / Self Care | Payer: Self-pay | Source: Home / Self Care | Attending: Internal Medicine

## 2011-09-30 ENCOUNTER — Ambulatory Visit (HOSPITAL_COMMUNITY): Admit: 2011-09-30 | Payer: Self-pay | Admitting: Cardiology

## 2011-09-30 HISTORY — PX: LEFT AND RIGHT HEART CATHETERIZATION WITH CORONARY ANGIOGRAM: SHX5449

## 2011-09-30 LAB — CBC
Hemoglobin: 10.3 g/dL — ABNORMAL LOW (ref 12.0–15.0)
MCH: 29 pg (ref 26.0–34.0)
MCHC: 32.7 g/dL (ref 30.0–36.0)
MCV: 88.7 fL (ref 78.0–100.0)
RBC: 3.55 MIL/uL — ABNORMAL LOW (ref 3.87–5.11)

## 2011-09-30 LAB — BASIC METABOLIC PANEL
BUN: 28 mg/dL — ABNORMAL HIGH (ref 6–23)
CO2: 24 mEq/L (ref 19–32)
Calcium: 9.1 mg/dL (ref 8.4–10.5)
Chloride: 103 mEq/L (ref 96–112)
Creatinine, Ser: 1.18 mg/dL — ABNORMAL HIGH (ref 0.50–1.10)
GFR calc Af Amer: 56 mL/min — ABNORMAL LOW (ref >90)

## 2011-09-30 LAB — PROTIME-INR: Prothrombin Time: 12.3 seconds (ref 11.6–15.2)

## 2011-09-30 LAB — POCT I-STAT 3, VENOUS BLOOD GAS (G3P V)
Bicarbonate: 22.8 mEq/L (ref 20.0–24.0)
TCO2: 24 mmol/L (ref 0–100)
pH, Ven: 7.361 — ABNORMAL HIGH (ref 7.250–7.300)

## 2011-09-30 LAB — POCT I-STAT 3, ART BLOOD GAS (G3+)
Bicarbonate: 24.7 mEq/L — ABNORMAL HIGH (ref 20.0–24.0)
pCO2 arterial: 37.6 mmHg (ref 35.0–45.0)
pH, Arterial: 7.426 — ABNORMAL HIGH (ref 7.350–7.400)

## 2011-09-30 LAB — PLATELET INHIBITION P2Y12: Platelet Function  P2Y12: 399 [PRU] (ref 194–418)

## 2011-09-30 SURGERY — LEFT AND RIGHT HEART CATHETERIZATION WITH CORONARY ANGIOGRAM
Anesthesia: LOCAL

## 2011-09-30 MED ORDER — NITROGLYCERIN 0.2 MG/ML ON CALL CATH LAB
INTRAVENOUS | Status: AC
  Filled 2011-09-30: qty 1

## 2011-09-30 MED ORDER — ASPIRIN 81 MG PO CHEW: 324.0000 mg | CHEWABLE_TABLET | Freq: Once | ORAL | Status: DC

## 2011-09-30 MED ORDER — MIDAZOLAM HCL 2 MG/2ML IJ SOLN
INTRAMUSCULAR | Status: AC
  Filled 2011-09-30: qty 2

## 2011-09-30 MED ORDER — ONDANSETRON HCL 4 MG/2ML IJ SOLN: 4.0000 mg | Freq: Four times a day (QID) | INTRAMUSCULAR | Status: DC | PRN

## 2011-09-30 MED ORDER — FENTANYL CITRATE 0.05 MG/ML IJ SOLN
INTRAMUSCULAR | Status: AC
  Filled 2011-09-30: qty 2

## 2011-09-30 MED ORDER — SODIUM CHLORIDE 0.9 % IV SOLN
INTRAVENOUS | Status: DC
  Administered 2011-10-01: 500 mL via INTRAVENOUS

## 2011-09-30 MED ORDER — ASPIRIN 81 MG PO CHEW
324.0000 mg | CHEWABLE_TABLET | ORAL | Status: DC
  Filled 2011-09-30: qty 4

## 2011-09-30 MED ORDER — HEPARIN (PORCINE) IN NACL 2-0.9 UNIT/ML-% IJ SOLN
INTRAMUSCULAR | Status: AC
  Filled 2011-09-30: qty 2000

## 2011-09-30 MED ORDER — ALBUTEROL SULFATE (5 MG/ML) 0.5% IN NEBU
2.5000 mg | INHALATION_SOLUTION | Freq: Once | RESPIRATORY_TRACT | Status: AC
  Administered 2011-09-30: 2.5 mg via RESPIRATORY_TRACT

## 2011-09-30 MED ORDER — ACETAMINOPHEN 325 MG PO TABS: 650.0000 mg | ORAL_TABLET | ORAL | Status: DC | PRN

## 2011-09-30 MED ORDER — LIDOCAINE HCL (PF) 1 % IJ SOLN
INTRAMUSCULAR | Status: AC
  Filled 2011-09-30: qty 30

## 2011-09-30 NOTE — Progress Notes (Signed)
Subjective: Patient feels ok today, mild to moderate productive cough with some dark and yellowllish sputum. No C/O wheezing, SOB or Chest pain. No other concerns and complaints. NO for Cardiac cath today.  Objective: Vital signs in last 24 hours: Filed Vitals:   09/29/11 2025 09/29/11 2100 09/30/11 0500 09/30/11 0717  BP:  106/66 133/86   Pulse:  101 83   Temp:  98.1 F (36.7 C) 97.8 F (36.6 C)   TempSrc:  Oral Oral   Resp:  20 22   Height:      Weight:    141 lb 15.6 oz (64.4 kg)  SpO2: 99% 97% 98%    Weight change:   Intake/Output Summary (Last 24 hours) at 09/30/11 0843 Last data filed at 09/30/11 6962  Gross per 24 hour  Intake    609 ml  Output      3 ml  Net    606 ml   Physical Exam: General: NAD, alert, well-developed, and cooperative to examination.  Head: normocephalic and atraumatic.  Eyes: vision grossly intact, pupils equal, pupils round, pupils reactive to light, no injection and anicteric.  Mouth: pharynx pink and moist, no erythema, and no exudates.  Neck: supple, full ROM, no thyromegaly, no JVD, and no carotid bruits.  Lungs: Bibasilar lung sounds diminished with occasional anterior and posterior expiratory wheezing noted, which is much better comparing to her lung assessment on admissino. No rales or rhonchi noted.  Heart: RRR. 3/6 systollic murmur noted.  Abdomen: soft, non-tender, normal bowel sounds, no distention, no guarding, no rebound tenderness, no hepatomegaly, and no splenomegaly.  Msk: no joint swelling, no joint warmth, and no redness over joints.  Pulses: 2+ DP/PT pulses bilaterally Extremities: No cyanosis, clubbing, edema Neurologic: alert & oriented X3, cranial nerves II-XII intact, strength normal in all extremities, sensation intact to light touch, and gait normal.  Skin: turgor normal and no rashes.  Psych: Patient is anxious and emotional disturbed due to his grandson's funeral at [facility] today.  Lab Results: Basic Metabolic  Panel:  Lab 09/30/11 0130 09/29/11 0640  Katie [name] 137 139  K 4.0 3.9  CL 103 105  CO2 24 26  GLUCOSE 104* 87  BUN 28* 25*  CREATININE 1.18* 1.16*  CALCIUM 9.1 9.3  MG -- --  PHOS -- --   Liver Function Tests:  Lab 09/26/11 1848 09/26/11 0942  AST 20 20  ALT 15 14  ALKPHOS 80 94  BILITOT 0.1* 0.1*  PROT 7.0 7.7  ALBUMIN 3.1* 3.2*   CBC:  Lab 09/30/11 0130 09/29/11 0640 09/26/11 1848 09/26/11 0902  WBC 7.9 9.1 -- --  NEUTROABS -- -- 7.0 1.6*  HGB 10.3* 10.3* -- --  HCT 31.5* 31.5* -- --  MCV 88.7 89.2 -- --  PLT 192 173 -- --   Coagulation:  Lab 09/30/11 0130 09/28/11 1049  LABPROT 12.3 12.6  INR 0.90 0.92   2D echo  Study Conclusions  - Left ventricle: The cavity size was normal. Wall thickness was increased in a pattern of moderate LVH. Systolic function was normal. The estimated ejection fraction was in the range of 55% to 60%. - Mitral valve: Mitral valve is thickened with SAM. Likely prolapse of posterior leaflet with severe MR. HOCM likely physiology present. Significant LVOT gradient that is not well quantified - Atrial septum: No defect or patent foramen ovale was identified. - Impressions: Likely flail or severely prolapsing posterior leaflet with severe MR and HOCM physiology. SAM with LVOT gradient not well  defined   Studies/Results: PFT pending CXR pending  Medications: I have reviewed the patient's current medications. Scheduled Meds:   . docusate sodium  100 mg Oral BID  . doxycycline  100 mg Oral Q12H  . feeding supplement  1 Container Oral BID BM  . Fluticasone-Salmeterol  1 puff Inhalation BID  . guaiFENesin  600 mg Oral BID  . nicotine  21 mg Transdermal Daily  . predniSONE  40 mg Oral Daily  . sodium chloride  3 mL Intravenous Q12H  . DISCONTD: albuterol  2.5 mg Nebulization Q6H  . DISCONTD: aspirin  324 mg Oral Pre-Cath  . DISCONTD: aspirin  324 mg Oral Once  . DISCONTD: enoxaparin  40 mg Subcutaneous Q24H  . DISCONTD: influenza   inactive virus vaccine  0.5 mL Intramuscular Once  . DISCONTD: ipratropium  0.5 mg Nebulization Q6H  . DISCONTD: pneumococcal 23 valent vaccine  0.5 mL Intramuscular Once   Continuous Infusions:  PRN Meds:.sodium chloride, acetaminophen, albuterol, benzonatate, guaiFENesin-codeine, ondansetron (ZOFRAN) IV, sodium chloride, zolpidem Assessment/Plan:  # Valvular disease and HOCM    Patient has had intermittent SOB in the past and reports syncope episodes with two recent episodes over the last 2 months. Patient reports she "just fell out." She is aware that she has a heart murmur which has never been evaluated. She was admitted for COPD exacerbation during this admission. On physical exam patient has a distinct holosystolic murmur.    2 D Echo indicates " Impressions: Likely flail or severely prolapsing posterior leaflet with severe MR and HOCM physiology. SAM with LVOT gradient not well defined".  - continue Cardiology and CVTS consult. Appreciate Dr.Turner and Dr. Barry Dienes recommendations. - NPO for cardiac cath today - pending PFTs and chest xray   # COPD exacerbation, resolving  Patient presents with 5 day history of productive cough, wheezing and SOB. Given her h/o COPD, the clinical manifestation is consistent with COPD exacerbation. ABG indicates compensated Rep alkalosis. Of note, patient has low WBC and ANC and she has had normal white count in the past. This is concerning for infectious etiology.  Pneumonia is unlikely since patient is afebrile and has negative CXR with no Leukocytosis. PE is also unlikely as Geneva score shows very low probability.   Plan  - continue steroids prednisone  - Continue the ABX treatment with Doxycycline 100 mg po BID  - Continue the Neb treatment and Spriva for maintenance  - Assessment for home O2 requirement today.   # Hemoptysis: resolving pt expectorated a very small amount of pink/maroon tinged sputum 09/28/11; no bright red blood. She is not  experiencing any respiratory distress of signiificant hemoptysis; hemoptysis can occur with PNA and acute bronchitis. She does not have any known risk for TB and is hemodynamically stable. Her Coag studies are WNL, and her CBC is stable.  - continue with doxycycline and other treatment outlined above   # Diarrhea - resolved.  Patient has not had any diarrhea since admission, likely viral gastroenteritis.  C Diff unlikely given no recent abx use.   # Hypokalemia, resolved.  Mild hypokalemia is noted on admission and quickly resolved after K replacement.  The etiology is likely due to GI loss 2/2 diarrhea and using of Beta agonist.  - will continue to monitor.    # History of HTN -  BP currently WNL. Will monitor for now.   #. DVT PPX - Lovenox on hold pending cardiac cath today.  #. disposition:  #1 Pending cardac cath. ?  MVR by CCVTS.  #2.Upon discharge, Patient will need to continue Doxycycline and prednisone tapering dose. She will need albuterol and Spiriva inhalers along with Albuterol and Atrovent Neb treatment.  #3. I will set up her PCP through our Clinic for outpatient management. And she will need to follow up with Dr. Mayford Knife and Dr. Barry Dienes.     LOS: 5 days   Katie [name] 09/30/2011, 8:43 AM

## 2011-09-30 NOTE — Progress Notes (Signed)
Spoke with Dr. Armanda Magic regarding pt's pre cath order for aspirin 324 mg,per MD patient does not need the aspirin,got order not to give medicine and to d/c it. Kermit Balo Joselita 09/30/2011

## 2011-09-30 NOTE — Op Note (Signed)
PROCEDURE:  Right and left heart catheterization with selective coronary angiography, left ventriculogram, cardiac outputs, selective oxygen saturations.  INDICATIONS:  65-year-old with COPD, hypertension with a recently diagnosed heart murmur with echocardiogram showing severe mitral valve prolapse with wide open severe mitral regurgitation. Both Dr. Mayford Knife and Dr. Cornelius Moras has seen the patient. Cardiac catheterization is being performed for possible upcoming surgery. The risks, benefits, and details of the procedure were explained to the patient, including stroke, heart attack, death, renal impairment, arterial damage, bleeding.  The patient verbalized understanding and wanted to proceed.  Informed written consent was obtained.  PROCEDURE TECHNIQUE:  After Xylocaine anesthesia and visualization of the femoral head via fluoroscopy, a 46F sheath was placed in the right femoral artery using the modified Seldinger technique.  A 7 French sheath was inserted into the right femoral vein using the modified Seldinger technique. Left coronary angiography was done using a Judkins L4 catheter.  Right coronary angiography was done using a Judkins R4 catheter. Multiple views with hand injection of Omnipaque were obtained. Left ventriculography was done using a pigtail catheter in the RAO position with power injection. Right heart catheterization was performed with a balloon tipped Swan-Ganz catheter traversing the right-sided heart structures to the wedge position. Oxygen saturation was drawn in the pulmonary artery as well as femoral artery. Hemodynamics were obtained. Cardiac outputs were obtained utilizing the Fick and thermodilution methods. Following the procedure, sheaths were removed and patient was hemodynamically stable.   CONTRAST:  Total of 100 ml.  COMPLICATIONS:  None.    HEMODYNAMICS:    Right atrium (RA): 11/10/9 mmHg Right ventricle (RV): 29/8/9 mmHg Pulmonary artery (PA): 26/12/18 mmHg Pulmonary  capillary wedge pressure (PCWP): 13/11/11 mmHg  Cardiac output: Fick 6.0 liters/min, Thermodilution 4.5 liters/min Cardiac index: Fick 3.6, Thermodilution 2.7  PA saturation: 71%  FA saturation: 97 %   Aortic pressure: 153/96/122 mmHg LV pressure: 153/15 mmHg systolic; LVEDP 14 mmHg.   There was no gradient between the left ventricle and aorta.    ANGIOGRAPHIC DATA:    Left main: No angiographically significant coronary artery disease.  Left anterior descending (LAD): No angiographic significant coronary artery disease. His vessel is smaller in caliber than the circumflex vessel which is quite large.  Circumflex artery (CIRC): No angiographically significant coronary artery disease. Large caliber vessel, tortuous. Codominant  Right coronary artery (RCA): No angiographically significant coronary artery disease. Codominant vessel  LEFT VENTRICULOGRAM:  Left ventricular angiogram was done in the 30 RAO projection and revealed normal left ventricular wall motion and systolic function with an estimated ejection fraction of 65-70 %.  LVEDP was . Interestingly, no significant mitral regurgitation was detected.  Abdominal aortogram- small saccular aneurysm seen in the distal aorta. Widely patent iliac vessels.  IMPRESSIONS:  1. No angiographically significant coronary artery disease. 2. Normal left ventricular systolic function.  LVEDP 14 mmHg.  Ejection fraction 65-70 %. 3. Normal right-sided heart pressures. 4. Normal cardiac output. 5.  No significant mitral regurgitation present, no significant V wave on pulmonary capillary wedge pressure  RECOMMENDATION:  Findings have been discussed with Dr. Mayford Knife. We will proceed with transesophageal echocardiogram tomorrow-Dr. Abe People.

## 2011-09-30 NOTE — H&P (Signed)
  I have reviewed Dr. Norris Cross previous consult note and agree with cardiac catheterization. Risks and benefits have been reviewed with patient. We will proceed.

## 2011-09-30 NOTE — Progress Notes (Signed)
Internal Medicine Teaching Service Attending Note Date: 09/30/2011  Patient name: Katie [name]  Medical record number: [id number]  Date of birth: [birth year]    This patient has been seen and discussed with the house staff. Please see their note for complete details. I concur with their findings: COPD management and cath today.  Will continue work up though optimally will wait to recoverfrom acute COPD exacerbation and so will be likely discharge soon with close follow up.   Korayma Hagwood 09/30/2011, 1:05 PM

## 2011-10-01 ENCOUNTER — Encounter (HOSPITAL_COMMUNITY): Payer: Self-pay | Admitting: *Deleted

## 2011-10-01 ENCOUNTER — Encounter (HOSPITAL_COMMUNITY)
Admission: EM | Disposition: A | Discharge status: Home / Self Care | Payer: Self-pay | Source: Home / Self Care | Attending: Internal Medicine

## 2011-10-01 DIAGNOSIS — K045 Chronic apical periodontitis: Secondary | ICD-10-CM

## 2011-10-01 DIAGNOSIS — K029 Dental caries, unspecified: Secondary | ICD-10-CM

## 2011-10-01 DIAGNOSIS — M278 Other specified diseases of jaws: Secondary | ICD-10-CM

## 2011-10-01 DIAGNOSIS — K0530 Chronic periodontitis, unspecified: Secondary | ICD-10-CM

## 2011-10-01 HISTORY — PX: TEE WITHOUT CARDIOVERSION: SHX5443

## 2011-10-01 SURGERY — ECHOCARDIOGRAM, TRANSESOPHAGEAL
Anesthesia: Moderate Sedation

## 2011-10-01 MED ORDER — LIDOCAINE VISCOUS 2 % MT SOLN
20.0000 mL | Freq: Once | OROMUCOSAL | Status: DC
  Filled 2011-10-01: qty 20

## 2011-10-01 MED ORDER — MIDAZOLAM HCL 10 MG/2ML IJ SOLN
10.0000 mg | Freq: Once | INTRAMUSCULAR | Status: AC
  Administered 2011-10-01: 2 mg via INTRAVENOUS

## 2011-10-01 MED ORDER — MIDAZOLAM HCL 10 MG/2ML IJ SOLN
INTRAMUSCULAR | Status: AC
  Filled 2011-10-01: qty 2

## 2011-10-01 MED ORDER — FENTANYL CITRATE 0.05 MG/ML IJ SOLN: 250.0000 ug | Freq: Once | INTRAMUSCULAR | Status: DC

## 2011-10-01 MED ORDER — LIDOCAINE VISCOUS 2 % MT SOLN
OROMUCOSAL | Status: AC
  Filled 2011-10-01: qty 15

## 2011-10-01 MED ORDER — LIDOCAINE VISCOUS 2 % MT SOLN
OROMUCOSAL | Status: DC | PRN
  Administered 2011-10-01: 10 mL via OROMUCOSAL

## 2011-10-01 MED ORDER — CEFAZOLIN SODIUM-DEXTROSE 2-3 GM-% IV SOLR
2.0000 g | INTRAVENOUS | Status: DC
  Filled 2011-10-01: qty 50

## 2011-10-01 MED ORDER — FENTANYL CITRATE 0.05 MG/ML IJ SOLN
INTRAMUSCULAR | Status: DC | PRN
  Administered 2011-10-01 (×2): 25 ug via INTRAVENOUS

## 2011-10-01 MED ORDER — MIDAZOLAM HCL 10 MG/2ML IJ SOLN
INTRAMUSCULAR | Status: DC | PRN
  Administered 2011-10-01: 1 mg via INTRAVENOUS

## 2011-10-01 MED ORDER — FENTANYL CITRATE 0.05 MG/ML IJ SOLN
INTRAMUSCULAR | Status: AC
  Filled 2011-10-01: qty 2

## 2011-10-01 MED ORDER — SODIUM CHLORIDE 0.9 % IJ SOLN
3.0000 mL | Freq: Two times a day (BID) | INTRAMUSCULAR | Status: DC
  Administered 2011-10-01 – 2011-10-02 (×2): 3 mL via INTRAVENOUS

## 2011-10-01 MED ORDER — SODIUM CHLORIDE 0.9 % IV SOLN: 250.0000 mL | INTRAVENOUS | Status: DC | PRN

## 2011-10-01 MED ORDER — BENZOCAINE 20 % MT SOLN
1.0000 "application " | OROMUCOSAL | Status: DC | PRN
  Filled 2011-10-01: qty 57

## 2011-10-01 MED ORDER — SODIUM CHLORIDE 0.9 % IJ SOLN: 3.0000 mL | INTRAMUSCULAR | Status: DC | PRN

## 2011-10-01 NOTE — Progress Notes (Signed)
TCTS BRIEF PROGRESS NOTE   Results of cath noted.  Await results of TEE.  Purcell Nails 10/01/2011

## 2011-10-01 NOTE — Progress Notes (Signed)
Internal Medicine Teaching Service Attending Note Date: 10/01/2011  Patient name: Katie [name]  Medical record number: [id number]  Date of birth: 1946/03/23    This patient has been seen and discussed with the house staff. Please see their note for complete details. I concur with their findings: Ongoing evaluation for MVR.  Likely will be discharged once plan is in place in next day or two.    Alexarae Oliva 10/01/2011, 3:28 PM

## 2011-10-01 NOTE — Progress Notes (Signed)
CARDIOTHORACIC SURGERY PROGRESS NOTE  Day of Surgery  S/P Procedure(s) (LRB): TRANSESOPHAGEAL ECHOCARDIOGRAM (TEE) (N/A)  Subjective: No complaints. No shortness of breath  Objective: Vital signs in last 24 hours: Temp:  [97.8 F (36.6 C)-98.2 F (36.8 C)] 97.8 F (36.6 C) (12/12 1222) Pulse Rate:  [80-90] 80  (12/12 1222) Cardiac Rhythm:  [-]  Resp:  [16-42] 18  (12/12 1222) BP: (88-124)/(56-87) 101/67 mmHg (12/12 1222) SpO2:  [94 %-100 %] 100 % (12/12 1222) FiO2 (%):  [21 %] 21 % (12/12 0757)  Physical Exam:  Rhythm:   sinus  Breath sounds: clear  Heart sounds:  RRR, no murmur presently  Incisions:  n/a  Abdomen:  soft  Extremities:  warm   Intake/Output from previous day: 12/11 0701 - 12/12 0700 In: 120 [P.O.:120] Out: 1 [Urine:1] Intake/Output this shift: Total I/O In: 203 [I.V.:203] Out: -   Lab Results:  Basename 09/30/11 0130 09/29/11 0640  WBC 7.9 9.1  HGB 10.3* 10.3*  HCT 31.5* 31.5*  PLT 192 173   BMET:  Basename 09/30/11 0130 09/29/11 0640  NA 137 139  K 4.0 3.9  CL 103 105  CO2 24 26  GLUCOSE 104* 87  BUN 28* 25*  CREATININE 1.18* 1.16*  CALCIUM 9.1 9.3    CBG (last 3)  No results found for this basename: GLUCAP:3 in the last 72 hours PT/INR:   Basename 09/30/11 0130  LABPROT 12.3  INR 0.90    CXR:  N/A  Assessment/Plan: S/P Procedure(s) (LRB): TRANSESOPHAGEAL ECHOCARDIOGRAM (TEE) (N/A)  I have reviewed [name]'s cath and TEE and discussed the findings with her.  Briefly, she has mitral valve prolapse with moderate mitral regurgitation.  She also has asymmetric septal hypertrophy with a small, hyperdynamic LV and significant LVH.  Although not present on the TEE today, her original TTE displayed significant systolic anterior motion (SAM) of the mitral valve with dynamic LV outflow tract obstruction and worsened mitral regurgitation.  This is not uncommon in patients with this constellation of findings, and can explain why  both her symptoms and her murmur may fluctuate substantially from time to time.  With respect to her mitral valve, her TEE did not reveal the presence of a flail leaflet nor severe mitral regurgitation.  ERO or regurgitant volume were not reported so the degree of MR is not quantified specifically, but I don't think it is anywhere near severe (4+).  Consistent with this is the fact that almost no MR was seen on left ventriculogram at cath and there were no large V waves seen on right heart cath.  Overall I do not feel that the severity of MR justifies elective surgery at this time and I recommend medical therapy with close follow up and repeat echocardiograms every 6-12 months.  Usually symptoms related to her MR and SAM can be minimized or eliminated with use of beta blockers to decrease HR and increase diastolic filling time, plus avoid diuretic therapy or any other cause of dehydration.    I would also recommend proceeding with dental extraction and follow up per Dr. Luretha Murphy recommendation, as she clearly remains at significant risk for the development of bacterial endocarditis.  There is no question that she has significant mitral valve prolapse with mitral regurgitation, and I expect that some day it may progress to the point where surgery is warranted.  I have emphasized to her and her family that this is the case and that she will need regular follow up with her  cardiologist.  I would be happy to follow her intermittently as well if desired.    Katie [name] H 10/01/2011 6:50 PM

## 2011-10-01 NOTE — Progress Notes (Signed)
Echocardiogram 2D Echocardiogram has been performed.  Katie [name] 10/01/2011, 12:03 PM

## 2011-10-01 NOTE — Progress Notes (Signed)
Subjective: Patient feels well today, No C/O cough, sputum or wheezing, SOB or Chest pain. No other concerns and complaints. NPO for TEE today. Good appetite and sleeps well. No report of diarrhea since admission.  Objective: Vital signs in last 24 hours: Filed Vitals:   09/30/11 2008 09/30/11 2100 10/01/11 0538 10/01/11 0757  BP:  105/62 88/56   Pulse:  90 82   Temp:  98.2 F (36.8 C) 97.8 F (36.6 C)   TempSrc:  Oral Oral   Resp:  18 16   Height:      Weight:      SpO2: 97% 100% 97% 96%   Weight change:   Intake/Output Summary (Last 24 hours) at 10/01/11 1018 Last data filed at 10/01/11 0908  Gross per 24 hour  Intake    123 ml  Output      1 ml  Net    122 ml   Physical Exam: General: NAD, alert, well-developed, and cooperative to examination.  Head: normocephalic and atraumatic. Poor dentition condition. Eyes: vision grossly intact, pupils equal, pupils round, pupils reactive to light, no injection and anicteric.  Mouth: pharynx pink and moist, no erythema, and no exudates.  Neck: supple, full ROM, no thyromegaly, no JVD, and no carotid bruits.  Lungs: Bibasilar lung sounds diminished. No rales, wheezing or rhonchi noted.  Heart: RRR. 2-3/6 systollic murmur noted.  Abdomen: soft, non-tender, normal bowel sounds, no distention, no guarding, no rebound tenderness, no hepatomegaly, and no splenomegaly.  Msk: no joint swelling, no joint warmth, and no redness over joints.  Pulses: 2+ DP/PT pulses bilaterally Extremities: No cyanosis, clubbing, edema Neurologic: alert & oriented X3, cranial nerves II-XII intact, strength normal in all extremities, sensation intact to light touch, and gait normal.  Skin: turgor normal and no rashes.  Psych: Patient is anxious and emotional disturbed due to his grandson's funeral at [facility] today.  Lab Results: Basic Metabolic Panel:  Lab 09/30/11 1610 09/29/11 0640  Bonnie Roig 137 139  K 4.0 3.9  CL 103 105  CO2 24 26  GLUCOSE 104* 87  BUN 28*  25*  CREATININE 1.18* 1.16*  CALCIUM 9.1 9.3  MG -- --  PHOS -- --   Liver Function Tests:  Lab 09/26/11 1848 09/26/11 0942  AST 20 20  ALT 15 14  ALKPHOS 80 94  BILITOT 0.1* 0.1*  PROT 7.0 7.7  ALBUMIN 3.1* 3.2*   No results found for this basename: LIPASE:2,AMYLASE:2 in the last 168 hours No results found for this basename: AMMONIA:2 in the last 168 hours CBC:  Lab 09/30/11 0130 09/29/11 0640 09/26/11 1848 09/26/11 0902  WBC 7.9 9.1 -- --  NEUTROABS -- -- 7.0 1.6*  HGB 10.3* 10.3* -- --  HCT 31.5* 31.5* -- --  MCV 88.7 89.2 -- --  PLT 192 173 -- --   Coagulation:  Lab 09/30/11 0130 09/28/11 1049  LABPROT 12.3 12.6  INR 0.90 0.92   Misc. Labs: 2D echo  - Left ventricle: The cavity size was normal. Wall thickness was increased in a pattern of moderate LVH. Systolic function was normal. The estimated ejection fraction was in the range of 55% to 60%. - Mitral valve: Mitral valve is thickened with SAM. Likely prolapse of posterior leaflet with severe MR. HOCM likely physiology present. Significant LVOT gradient that is not well quantified - Atrial septum: No defect or patent foramen ovale was identified. - Impressions: Likely flail or severely prolapsing posterior leaflet with severe MR and HOCM physiology. SAM with  LVOT gradient not well defined  TEE  Pending PFT  pending  Studies/Results: Dg Orthopantogram  09/30/2011  *RADIOLOGY REPORT*  Clinical Data: Evaluate for dental caries or abscess.  Pain in right front tooth.  DG ORTHOPANTOGRAM/PANORAMIC  Comparison: None.  Findings:  The patient has multiple missing teeth.  Probable lucency is seen in the right maxillary front tooth, both mandibular front teeth, and the third right mandibular tooth.  High-density material seen within the teeth and roots of two right mandibular teeth, suggesting prior dental intervention. Filling is seen in the posterior right mandibular molar.  No definite focal periapical lucencies.   IMPRESSION: 1. Findings suggestive of several dental caries. Suggest evaluation by a dentist.  2.  Multiple missing teeth.  Original Report Authenticated By: Britta Mccreedy, M.D.   Dg Chest 2 View  09/30/2011  *RADIOLOGY REPORT*  Clinical Data: Rule out CHF  CHEST - 2 VIEW  Comparison: Chest radiograph 09/25/2011  Findings: The heart, mediastinal, and hilar contours remain within normal limits.  Pulmonary vascularity is normal.  There is stable pleuroparenchymal scarring at both lung apices.  Negative for pulmonary edema, focal airspace disease, or pleural effusion.  No acute osseous abnormality.  Surgical clips in the right abdomen.  IMPRESSION: Stable exam.  No acute cardiopulmonary disease.  Original Report Authenticated By: Britta Mccreedy, M.D.   Medications: I have reviewed the patient's current medications. Scheduled Meds:   . docusate sodium  100 mg Oral BID  . doxycycline  100 mg Oral Q12H  . feeding supplement  1 Container Oral BID BM  . fentaNYL      . Fluticasone-Salmeterol  1 puff Inhalation BID  . guaiFENesin  600 mg Oral BID  . heparin      . lidocaine      . midazolam      . nicotine  21 mg Transdermal Daily  . nitroGLYCERIN      . predniSONE  40 mg Oral Daily  . sodium chloride  3 mL Intravenous Q12H   Continuous Infusions:   . sodium chloride     PRN Meds:.sodium chloride, acetaminophen, albuterol, benzonatate, guaiFENesin-codeine, ondansetron (ZOFRAN) IV, sodium chloride, zolpidem, DISCONTD: acetaminophen, DISCONTD: ondansetron (ZOFRAN) IV  Assessment/Plan:  # Valvular disease and HOCM  Patient has had intermittent SOB in the past and reports syncope episodes with two recent episodes over the last 2 months. Patient reports she "just fell out." She is aware that she has a heart murmur which has never been evaluated. She was admitted for COPD exacerbation during this admission. On physical exam patient has a distinct holosystolic murmur.   2 D Echo indicates "  Impressions: Likely flail or severely prolapsing posterior leaflet with severe MR and HOCM physiology. SAM with LVOT gradient not well defined".   Cardiac cath   Abdominal aortogram- small saccular aneurysm seen in the distal aorta. Widely patent iliac vessels.  IMPRESSIONS:  1. No angiographically significant coronary artery disease. 2. Normal left ventricular systolic function. LVEDP 14 mmHg. Ejection fraction 65-70 %. 3. Normal right-sided heart pressures. 4. Normal cardiac output.       5. No significant mitral regurgitation present, no significant V wave on pulmonary capillary wedge pressure   - continue Cardiology and CVTS consult. Appreciate Dr.Turner and Dr. Barry Dienes recommendations.  - NPO for TEE today  - pending PFTs    # COPD exacerbation, resolving  Patient presents with 5 day history of productive cough, wheezing and SOB. And her symptoms have largely resolved.  Plan  -  continue steroids prednisone  - Continue the ABX treatment with Doxycycline 100 mg po BID  - Continue the Neb treatment and Spriva for maintenance  - Assessment for home O2 requirement upon discharge once she is cleared by cardiology.   # Hemoptysis: resolved pt expectorated a very small amount of pink/maroon tinged sputum 09/28/11; no bright red blood. She is not experiencing any respiratory distress She does not have any known risk for TB and is hemodynamically stable. Her Coag studies are WNL, and her CBC is stable.  She has no more hemoptysis.  - continue with doxycycline and other treatment outlined above   # Diarrhea - resolved.  Patient has not had any diarrhea since admission, likely viral gastroenteritis.  C Diff unlikely given no recent abx use.   # Hypokalemia, resolved.  Mild hypokalemia is noted on admission and quickly resolved after K replacement.  The etiology is likely due to GI loss 2/2 diarrhea and using of Beta agonist.  - will continue to monitor.   # History of HTN -  BP  currently WNL. Will monitor for now.   #. DVT PPX - Lovenox on hold pending cardiac cath today.   #. disposition:  #1 Pending TEE ? MVR by CCVTS.  Patient had dental consult for her poor dentition.  #2.Upon discharge, Patient will need to continue Doxycycline and prednisone tapering dose. She will need albuterol and Spiriva inhalers along with Albuterol and Atrovent Neb treatment.  #3. I will set up her PCP through our Clinic for outpatient management. And she will need to follow up with Dr. Mayford Knife and Dr. Barry Dienes.   LOS: 5 days  Cordarius Benning  09/30/2011, 8:43 AM   LOS: 6 days   Arthurine Oleary 10/01/2011, 10:18 AM

## 2011-10-01 NOTE — Consult Note (Signed)
DENTAL CONSULTATION  10/01/2011 Katie [name] Date of birth:  [birth year] MRN:   [id number]  VITALS: BP 101/67  Pulse 80  Temp(Src) 97.8 F (36.6 C) (Tympanic)  Resp 18  Ht 5\' 1"  (1.549 m)  Wt 141 lb 15.6 oz (64.4 kg)  BMI 26.83 kg/m2  SpO2 100%  LABS: Lab Results  Component Value Date   WBC 7.9 09/30/2011   HGB 10.3* 09/30/2011   HCT 31.5* 09/30/2011   MCV 88.7 09/30/2011   PLT 192 09/30/2011      Component Value Date/Time   NA 137 09/30/2011 0130   K 4.0 09/30/2011 0130   CL 103 09/30/2011 0130   CO2 24 09/30/2011 0130   GLUCOSE 104* 09/30/2011 0130   BUN 28* 09/30/2011 0130   CREATININE 1.18* 09/30/2011 0130   CALCIUM 9.1 09/30/2011 0130   GFRNONAA 48* 09/30/2011 0130   GFRAA 56* 09/30/2011 0130    Chief Complaint: Dental  consultation requested to rule out dental infection that may affect the patient's systemic health and anticipated heart valve surgery.  HPI: Katie [name] is a 65 year old married Hispanic female with severe mitral regurgitation. Patient currently being evaluated further with cardiac catheterization and transesophageal echocardiogram to determine the extent of her mitral valve disease. Patient most likely will require a mitral valve replacement or repair heart surgery. This will be with Dr. Tressie Stalker. Dental consultation requested to rule out dental infection that may affect the patient's systemic health anticipated heart valve surgery as well as put the patient at risk for future endocarditis.  Patient currently denies acute abscess, swellings, or pain. Patient, however, indicates that she has a severely loose maxillary anterior tooth. Patient points to tooth #8. Patient indicates that she has not seen a dentist for" a while". Patient is interested in having all remaining teeth extracted at this time. She does not have partial dentures.  PMH: Past Medical History  Diagnosis Date  . Arthritis   . COPD (chronic obstructive pulmonary  disease)     no history of PFTs, only diagnosed by CXR, not on any inhalers because she has not had a PCP in over 2 years  . Hypertension     previously on Lisinopril/HCTZ  . History of heartburn   . Hypercholesteremia     previously on pravastatin   . Cholelithiasis 2011    s/p cholescystectomy   . Renal cell carcinoma 01/2010    s/p right radical nephrectomy 01/2010,  followed by [facility]  . Murmur     no  prior work-up  . GERD (gastroesophageal reflux disease)   . Emphysema 2010    dx'd/CXR  . Shortness of breath 09/26/11    "lately all the time"  . Pneumonia 1980's    "walking"    ALLERGIES: No Known Allergies  MEDICATIONS: Current Facility-Administered Medications  Medication Dose Route Frequency Provider Last Rate Last Dose  . 0.9 %  sodium chloride infusion   Intravenous Continuous Quintella Reichert, MD 20 mL/hr at 10/01/11 1056 500 mL at 10/01/11 1056  . 0.9 %  sodium chloride infusion  250 mL Intravenous PRN Corky Crafts      . acetaminophen (TYLENOL) tablet 650 mg  650 mg Oral Q4H PRN Donato Schultz, MD      . albuterol (PROVENTIL) (5 MG/ML) 0.5% nebulizer solution 2.5 mg  2.5 mg Nebulization Q2H PRN Nelda Bucks      . benzocaine (HURRICAINE) 20 % mouth spray 1 application  1 application Mouth/Throat PRN Corky Crafts      .  benzonatate (TESSALON) capsule 100 mg  100 mg Oral TID PRN Amanjot Sidhu   100 mg at 09/28/11 0734  . docusate sodium (COLACE) capsule 100 mg  100 mg Oral BID Amanjot Sidhu   100 mg at 09/30/11 2208  . doxycycline (VIBRA-TABS) tablet 100 mg  100 mg Oral Q12H Amanjot Sidhu   100 mg at 09/30/11 2209  . feeding supplement (ENSURE) pudding 1 Container  1 Container Oral BID BM Dede Query, MD   1 Container at 09/29/11 1445  . fentaNYL (SUBLIMAZE) injection 250 mcg  250 mcg Intravenous Once Corky Crafts      . Fluticasone-Salmeterol (ADVAIR) 250-50 MCG/DOSE inhaler 1 puff  1 puff Inhalation BID Amanjot Sidhu   1 puff at 10/01/11 0755    . guaiFENesin (MUCINEX) 12 hr tablet 600 mg  600 mg Oral BID Na Li, MD   600 mg at 09/30/11 2209  . guaiFENesin-codeine 100-10 MG/5ML solution 5 mL  5 mL Oral Q6H PRN Amanjot Sidhu      . lidocaine (XYLOCAINE) 2 % viscous mouth solution 20 mL  20 mL Mouth/Throat Once Corky Crafts      . midazolam (VERSED) 10 MG/2ML injection 10 mg  10 mg Intravenous Once Jayadeep S. Varanasi   2 mg at 10/01/11 1124  . nicotine (NICODERM CQ - dosed in mg/24 hours) patch 21 mg  21 mg Transdermal Daily Amanjot Sidhu   21 mg at 10/01/11 0907  . ondansetron (ZOFRAN) injection 4 mg  4 mg Intravenous Q6H PRN Donato Schultz, MD      . predniSONE (DELTASONE) tablet 40 mg  40 mg Oral Daily Kristin Mills   40 mg at 09/29/11 1046  . sodium chloride 0.9 % injection 3 mL  3 mL Intravenous Q12H Corky Crafts      . sodium chloride 0.9 % injection 3 mL  3 mL Intravenous PRN Corky Crafts      . zolpidem (AMBIEN) tablet 5 mg  5 mg Oral QHS PRN Nelda Bucks   5 mg at 09/30/11 2210  . DISCONTD: 0.9 %  sodium chloride infusion  250 mL Intravenous PRN Quintella Reichert, MD      . DISCONTD: acetaminophen (TYLENOL) tablet 650 mg  650 mg Oral Q6H PRN Amanjot Sidhu      . DISCONTD: fentaNYL (SUBLIMAZE) injection    PRN Corky Crafts   25 mcg at 10/01/11 1129  . DISCONTD: lidocaine (XYLOCAINE) 2 % viscous mouth solution    PRN Jayadeep S. Varanasi   10 mL at 10/01/11 1124  . DISCONTD: midazolam (VERSED) 10 MG/2ML injection    PRN Corky Crafts   1 mg at 10/01/11 1126  . DISCONTD: ondansetron (ZOFRAN) injection 4 mg  4 mg Intravenous Q6H PRN Amanjot Sidhu      . DISCONTD: sodium chloride 0.9 % injection 3 mL  3 mL Intravenous Q12H Quintella Reichert, MD   3 mL at 10/01/11 0908  . DISCONTD: sodium chloride 0.9 % injection 3 mL  3 mL Intravenous PRN Quintella Reichert, MD        SOCIAL HISTORY: History   Social History  . Marital Status: Married    Spouse Name: N/A    Number of Children: 3  . Years of  Education: 12   Occupational History  . Futures trader business   Social History Main Topics  . Smoking status: Current Everyday Smoker -- 1.0 packs/day for 50 years  Types: Cigarettes  . Smokeless tobacco: Never Used   Comment: smoking cessation consult entered  . Alcohol Use: Yes     09/26/11 "last cocaine use ~ 2006; drink very rarely"  . Drug Use: Yes    Special: Cocaine  . Sexually Active: No   Other Topics Concern  . Not on file   Social History Narrative   Lives in [place] with her husband. 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 Patient has 3 grown children that do not live with her. Patient does not have any medical insurance.    FAMILY HISTORY: Family History  Problem Relation Age of Onset  . COPD Mother     DECEASED     REVIEW OF SYSTEMS: Reviewed from chart for this admission.  DENTAL HISTORY:  Chief Complaint: Dental  consultation requested to rule out dental infection that may affect the patient's systemic health and anticipated heart valve surgery.  HPI: Katie [name] is a 65 year old married Hispanic female with severe mitral regurgitation. Patient currently being evaluated further with cardiac catheterization and transesophageal echocardiogram to determine the extent of her mitral valve disease. Patient most likely will require a mitral valve replacement or repair heart surgery. This will be with Dr. Tressie Stalker. Dental consultation requested to rule out dental infection that may affect the patient's systemic health anticipated heart valve surgery as well as put the patient at risk for future endocarditis.  Patient currently denies acute abscess, swellings, or pain. Patient, however, indicates that she has a severely loose maxillary anterior tooth. Patient points to tooth #8. Patient indicates that she has not seen a dentist for" a while". Patient is interested in having all remaining teeth extracted  at this time. She does not have partial dentures.   DENTAL EXAMINATION: GENERAL: patient is a well-developed, well-nourished female in no acute distress to HEAD AND NECK: there is no obvious lymphadenopathy. The patient denies acute TMJ symptoms.  INTRAORAL EXAM: there is no evidence of abscess formation. Patient has normal saliva. Patient has a large mid palatal torus near the vibrating line. Patient may have buccal exostoses that need to be reduced prior to denture fabrication. DENTITION: The patient is missing multiple teeth to include tooth numbers 2, 3, 4, 5, 7, 9, 10, 12, 13, 14, 15, 16, 17, 18, 19, 20, 21, 24, 25, 30, 31, and 32.  PERIODONTAL: patient with chronic, advance periodontal disease with plaque and calculus accumulations, generalized gingival recession, and tooth mobility especially tooth #8 which is class II mobility.  DENTAL CARIES/SUBOPTIMAL RESTORATIONS: patient with dental caries and suboptimal dental restorations.  ENDODONTIC:Patient currently denies acute pulpitis symptoms but does have multiple areas of periapical pathology and radiolucency including tooth numbers 8 and 26. Tooth numbers 20 and 29 and had previous root canal therapy.  CROWN AND BRIDGE:there are no crown restorations noted. PROSTHODONTIC: patient has no history of partial dentures. OCCLUSION: patient with a poor occlusal scheme secondary to multiple missing teeth, supra-eruption and drifting of the unopposed teeth into the edentulous areas, and lack of replacement of the missing teeth with dental prostheses.   RADIOGRAPHIC INTERPRETATION: A panoramic x-ray was taken by the department of radiology. There. are multiple missing teeth. There are multiple areas of periapical pathology and radiolucency. There is moderate to severe bone loss. There is calculus noted. There are dental caries noted. There is supra-eruption and drifting of the unopposed teeth into the edentulous areas.  ASSESSMENTS: 1. Chronic  periodontitis of bone loss 2. Plaque and  calculus accumulations 3. Gingival recession 4. Tooth mobility especially tooth #8 5. Multiple missing teeth. 6. Chronic apical periodontitis 7. Dental caries 8. Buccal exostoses 9. Palatal torus near the vibrating line. 10. No history of partial dentures 11. Poor occlusal scheme 12. Need for antibiotic premedication prior to invasive dental procedures  PLAN/RECOMMENDATIONS: 1. I discussed the risks, complications, and benefits of various treatment options in relationship to the patient's medical and dental conditions and anticipated heart valve surgery. We discussed various treatment options to include no treatment, multiple extractions with alveoloplasty, pre-prosthetic surgery as indicated, referral to an oral surgeon for the palatal torus reduction, periodontal therapy, dental restorations, root canal therapy, crown or bridge therapy, implant therapy, and replacement of missing teeth is indicated after adequate healing by the dentist of her choice. Patient currently wished to proceed with extraction of remaining teeth with alveoloplasty and buccal exostoses reductions in the OR.. Patient knows that she will need to follow up an oral surgeon for the palatal torus reduction if this is indicated after an attempt to fabricate the upper denture around this palatal torus. Operating room procedure has tentatively been scheduled for Monday, 10/06/2011 at 11:15 AM in the operating room at [facility]. This can be achieved either as an outpatient or inpatient depending upon the status of the patient.  2. Discussion of findings with cardiology and cardiothoracic surgery team to assist in coordination of the dental medicine procedure as well as the anticipated heart valve surgery.   Dr. Cindra Eves

## 2011-10-02 ENCOUNTER — Encounter (HOSPITAL_COMMUNITY): Payer: Self-pay | Admitting: Interventional Cardiology

## 2011-10-02 DIAGNOSIS — I059 Rheumatic mitral valve disease, unspecified: Secondary | ICD-10-CM

## 2011-10-02 MED ORDER — ALBUTEROL 90 MCG/ACT IN AERS: 2.0000 | INHALATION_SPRAY | Freq: Four times a day (QID) | RESPIRATORY_TRACT | Status: DC | PRN

## 2011-10-02 MED ORDER — ALBUTEROL SULFATE (5 MG/ML) 0.5% IN NEBU: 2.5000 mg | INHALATION_SOLUTION | RESPIRATORY_TRACT | Status: DC | PRN

## 2011-10-02 MED ORDER — IPRATROPIUM BROMIDE 0.02 % IN SOLN: 500.0000 ug | Freq: Four times a day (QID) | RESPIRATORY_TRACT | Status: DC

## 2011-10-02 MED ORDER — DOXYCYCLINE HYCLATE 100 MG PO TABS: 100.0000 mg | ORAL_TABLET | Freq: Two times a day (BID) | ORAL | Status: DC

## 2011-10-02 MED ORDER — PREDNISONE 10 MG PO TABS: ORAL_TABLET | ORAL | Status: DC

## 2011-10-02 MED ORDER — HEPARIN (PORCINE) IN NACL 2-0.9 UNIT/ML-% IJ SOLN
INTRAMUSCULAR | Status: AC
  Filled 2011-10-02: qty 2000

## 2011-10-02 MED ORDER — MIDAZOLAM HCL 2 MG/2ML IJ SOLN
INTRAMUSCULAR | Status: AC
  Filled 2011-10-02: qty 1

## 2011-10-02 MED ORDER — NITROGLYCERIN 0.2 MG/ML ON CALL CATH LAB
INTRAVENOUS | Status: AC
  Filled 2011-10-02: qty 1

## 2011-10-02 MED ORDER — FLUTICASONE-SALMETEROL 250-50 MCG/DOSE IN AEPB: 1.0000 | INHALATION_SPRAY | Freq: Two times a day (BID) | RESPIRATORY_TRACT | Status: DC

## 2011-10-02 MED ORDER — LIDOCAINE HCL (PF) 1 % IJ SOLN
INTRAMUSCULAR | Status: AC
  Filled 2011-10-02: qty 30

## 2011-10-02 MED ORDER — FENTANYL CITRATE 0.05 MG/ML IJ SOLN
INTRAMUSCULAR | Status: AC
  Filled 2011-10-02: qty 0.5

## 2011-10-02 MED ORDER — NICOTINE 21 MG/24HR TD PT24: 1.0000 | MEDICATED_PATCH | Freq: Every day | TRANSDERMAL | Status: AC

## 2011-10-02 NOTE — Progress Notes (Signed)
SUBJECTIVE:  Feeling well this am  OBJECTIVE:   Vitals:   Filed Vitals:   10/01/11 2201 10/01/11 2220 10/02/11 0300 10/02/11 0758  BP: 91/58  91/60   Pulse: 83  92   Temp: 98 F (36.7 C)  98.2 F (36.8 C)   TempSrc: Oral  Oral   Resp: 20  18   Height:      Weight:      SpO2: 96% 98% 99% 98%   I&O's:   Intake/Output Summary (Last 24 hours) at 10/02/11 0817 Last data filed at 10/02/11 0300  Gross per 24 hour  Intake    203 ml  Output      1 ml  Net    202 ml   TELEMETRY: Reviewed telemetry pt in NSR     PHYSICAL EXAM General: Well developed, well nourished, in no acute distress Head: Eyes PERRLA, No xanthomas.   Normal cephalic and atramatic  Lungs:   Clear bilaterally to auscultation and percussion. Heart:   HRRR S1 S2 Pulses are 2+ & equal.            No carotid bruit. No JVD.  No abdominal bruits. No femoral bruits. Abdomen: Bowel sounds are positive, abdomen soft and non-tender without masses  Extremities:   No clubbing, cyanosis or edema.  DP +1 Neuro: Alert and oriented X 3. Psych:  Good affect, responds appropriately   LABS: Basic Metabolic Panel:  Basename 09/30/11 0130  NA 137  K 4.0  CL 103  CO2 24  GLUCOSE 104*  BUN 28*  CREATININE 1.18*  CALCIUM 9.1  MG --  PHOS --   CBC:  Basename 09/30/11 0130  WBC 7.9  NEUTROABS --  HGB 10.3*  HCT 31.5*  MCV 88.7  PLT 192   Coag Panel:   Lab Results  Component Value Date   INR 0.90 09/30/2011   INR 0.92 09/28/2011   INR 0.96 02/05/2010    RADIOLOGY: Dg Orthopantogram  09/30/2011  *RADIOLOGY REPORT*  Clinical Data: Evaluate for dental caries or abscess.  Pain in right front tooth.  DG ORTHOPANTOGRAM/PANORAMIC  Comparison: None.  Findings:  The patient has multiple missing teeth.  Probable lucency is seen in the right maxillary front tooth, both mandibular front teeth, and the third right mandibular tooth.  High-density material seen within the teeth and roots of two right mandibular teeth,  suggesting prior dental intervention. Filling is seen in the posterior right mandibular molar.  No definite focal periapical lucencies.  IMPRESSION: 1. Findings suggestive of several dental caries. Suggest evaluation by a dentist.  2.  Multiple missing teeth.  Original Report Authenticated By: Britta Mccreedy, M.D.   Dg Chest 2 View  09/30/2011  *RADIOLOGY REPORT*  Clinical Data: Rule out CHF  CHEST - 2 VIEW  Comparison: Chest radiograph 09/25/2011  Findings: The heart, mediastinal, and hilar contours remain within normal limits.  Pulmonary vascularity is normal.  There is stable pleuroparenchymal scarring at both lung apices.  Negative for pulmonary edema, focal airspace disease, or pleural effusion.  No acute osseous abnormality.  Surgical clips in the right abdomen.  IMPRESSION: Stable exam.  No acute cardiopulmonary disease.  Original Report Authenticated By: Britta Mccreedy, M.D.   Dg Chest 2 View  09/25/2011  *RADIOLOGY REPORT*  Clinical Data: Chest pain with cough, congestion and shortness of breath for 4 days.  CHEST - 2 VIEW  Comparison: 02/05/2010.  Findings: The heart size and mediastinal contours are stable. There is stable biapical scarring.  No  confluent airspace opacity, edema or pleural effusion is present.  Osseous structures are stable.  There are multiple surgical clips in the right upper quadrant of the abdomen which are new, probably related to prior right nephrectomy.  IMPRESSION: No acute cardiopulmonary process.  Original Report Authenticated By: Gerrianne Scale, M.D.      ASSESSMENT:  1.  Mitral valve prolapse with moderate MR - appreciate CVTS input.  No indication at present for surgical repair so will continue to follow for progression with q6 month echo 2.  Intermittent systolic anterior motion of anterior mitral valve leaflet (SAM) most likely causing intermittent accentuation of MR with dynamic outflow obstruction. 3.  Normal LVF  PLAN:   1.  Ok to d/c home from cardiac  standpoint 2.  Agree with Dr. Cornelius Moras would proceed with teeth extraction given high risk for development of SBE 3.  Followup with me in the office in 3-4 weeks  Quintella Reichert, MD  10/02/2011  8:17 AM

## 2011-10-02 NOTE — Progress Notes (Signed)
Katie [name] to be D/C'd Home per MD order.  Discussed with the patient and all questions fully answered.   Katie [name], Katie [name]  Home Medication Instructions ZOX:[id number]   Printed on:10/02/11 1530  Medication Information                    doxycycline (VIBRA-TABS) 100 MG tablet Take 1 tablet (100 mg total) by mouth every 12 (twelve) hours.           nicotine (NICODERM CQ - DOSED IN MG/24 HOURS) 21 mg/24hr patch Place 1 patch (21 patches total) onto the skin daily.           predniSONE (DELTASONE) 10 MG tablet 30 mg po daily for 3 days, then 20 mg po for 3 days, then 10 mg po daily for 3 days. Then stop.           albuterol (PROVENTIL,VENTOLIN) 90 MCG/ACT inhaler Inhale 2 puffs into the lungs every 6 (six) hours as needed for wheezing.           albuterol (PROVENTIL) (5 MG/ML) 0.5% nebulizer solution Take 0.5 mLs (2.5 mg total) by nebulization every 4 (four) hours as needed for wheezing or shortness of breath.           Fluticasone-Salmeterol (ADVAIR) 250-50 MCG/DOSE AEPB Inhale 1 puff into the lungs 2 (two) times daily.           ipratropium (ATROVENT) 0.02 % nebulizer solution Take 2.5 mLs (500 mcg total) by nebulization 4 (four) times daily.             VVS, Skin clean, dry and intact without evidence of skin break down, no evidence of skin tears noted. IV catheter discontinued intact. Site without signs and symptoms of complications. Dressing and pressure applied. Cardiac catherization femoral site level 0.   An After Visit Summary was printed and given to the patient. Patient escorted via WC, and D/C home via private auto.  Debbora Presto 10/02/2011 3:30 PM

## 2011-10-02 NOTE — Progress Notes (Signed)
Subjective: Patient feels well today, No C/O cough, sputum or wheezing, SOB or Chest pain. No other concerns and complaints. Good appetite and sleeps well. No report of diarrhea since admission.   Objective: Vital signs in last 24 hours: Filed Vitals:   10/01/11 1222 10/01/11 2201 10/01/11 2220 10/02/11 0300  BP: 101/67 91/58  91/60  Pulse: 80 83  92  Temp: 97.8 F (36.6 C) 98 F (36.7 C)  98.2 F (36.8 C)  TempSrc: Tympanic Oral  Oral  Resp: 18 20  18   Height:      Weight:      SpO2: 100% 96% 98% 99%   Weight change:   Intake/Output Summary (Last 24 hours) at 10/02/11 0743 Last data filed at 10/02/11 0300  Gross per 24 hour  Intake    203 ml  Output      1 ml  Net    202 ml   Physical Exam: General: NAD, alert, well-developed, and cooperative to examination.  Head: normocephalic and atraumatic. Poor dentition condition. Eyes: vision grossly intact, pupils equal, pupils round, pupils reactive to light, no injection and anicteric.  Mouth: pharynx pink and moist, no erythema, and no exudates.  Neck: supple, full ROM, no thyromegaly, no JVD, and no carotid bruits.  Lungs: Bibasilar lung sounds diminished. No rales, wheezing or rhonchi noted.  Heart: RRR. 2/6 systollic murmur noted.  Abdomen: soft, non-tender, normal bowel sounds, no distention, no guarding, no rebound tenderness, no hepatomegaly, and no splenomegaly.  Msk: no joint swelling, no joint warmth, and no redness over joints.  Pulses: 2+ DP/PT pulses bilaterally Extremities: No cyanosis, clubbing, edema Neurologic: alert & oriented X3, cranial nerves II-XII intact, strength normal in all extremities, sensation intact to light touch, and gait normal.  Skin: turgor normal and no rashes.    Lab Results: Basic Metabolic Panel:  Lab 09/30/11 0454 09/29/11 0640  Iwao Shamblin 137 139  K 4.0 3.9  CL 103 105  CO2 24 26  GLUCOSE 104* 87  BUN 28* 25*  CREATININE 1.18* 1.16*  CALCIUM 9.1 9.3  MG -- --  PHOS -- --   Liver  Function Tests:  Lab 09/26/11 1848 09/26/11 0942  AST 20 20  ALT 15 14  ALKPHOS 80 94  BILITOT 0.1* 0.1*  PROT 7.0 7.7  ALBUMIN 3.1* 3.2*   CBC:  Lab 09/30/11 0130 09/29/11 0640 09/26/11 1848 09/26/11 0902  WBC 7.9 9.1 -- --  NEUTROABS -- -- 7.0 1.6*  HGB 10.3* 10.3* -- --  HCT 31.5* 31.5* -- --  MCV 88.7 89.2 -- --  PLT 192 173 -- --   Coagulation:  Lab 09/30/11 0130 09/28/11 1049  LABPROT 12.3 12.6  INR 0.90 0.92   Misc. Labs:  2D echo - Left ventricle: The cavity size was normal. Wall thickness was increased in a pattern of moderate LVH. Systolic function was normal. The estimated ejection fraction was in the range of 55% to 60%. - Mitral valve: Mitral valve is thickened with SAM. Likely prolapse of posterior leaflet with severe MR. HOCM likely physiology present. Significant LVOT gradient that is not well quantified - Atrial septum: No defect or patent foramen ovale was identified. - Impressions: Likely flail or severely prolapsing posterior leaflet with severe MR and HOCM physiology. SAM with LVOT gradient not well defined  TEE Study Conclusions  - Left ventricle: There was mild focal basal hypertrophy of the septum. Systolic function was normal. The estimated ejection fraction was in the range of 60% to 65%. -  Mitral valve: Mildly thickened anterior leaflet. Severely thickened, myxomatous posterior leaflet with significant prolapse. Moderate to severe regurgitation directed eccentrically, mostly toward the septum. There are multiple MR jets noted. - Left atrium: No evidence of thrombus in the atrial cavity or appendage. - Atrial septum: No defect or patent foramen ovale was identified.  PFT results pending  Studies/Results: Dg Orthopantogram  09/30/2011  *RADIOLOGY REPORT*  Clinical Data: Evaluate for dental caries or abscess.  Pain in right front tooth.  DG ORTHOPANTOGRAM/PANORAMIC  Comparison: None.  Findings:  The patient has multiple missing  teeth.  Probable lucency is seen in the right maxillary front tooth, both mandibular front teeth, and the third right mandibular tooth.  High-density material seen within the teeth and roots of two right mandibular teeth, suggesting prior dental intervention. Filling is seen in the posterior right mandibular molar.  No definite focal periapical lucencies.  IMPRESSION: 1. Findings suggestive of several dental caries. Suggest evaluation by a dentist.  2.  Multiple missing teeth.  Original Report Authenticated By: Britta Mccreedy, M.D.   Dg Chest 2 View  09/30/2011  *RADIOLOGY REPORT*  Clinical Data: Rule out CHF  CHEST - 2 VIEW  Comparison: Chest radiograph 09/25/2011  Findings: The heart, mediastinal, and hilar contours remain within normal limits.  Pulmonary vascularity is normal.  There is stable pleuroparenchymal scarring at both lung apices.  Negative for pulmonary edema, focal airspace disease, or pleural effusion.  No acute osseous abnormality.  Surgical clips in the right abdomen.  IMPRESSION: Stable exam.  No acute cardiopulmonary disease.  Original Report Authenticated By: Britta Mccreedy, M.D.   Medications: I have reviewed the patient's current medications. Scheduled Meds:   . ceFAZolin (ANCEF) IV  2 g Intravenous 60 min Pre-Op  . docusate sodium  100 mg Oral BID  . doxycycline  100 mg Oral Q12H  . feeding supplement  1 Container Oral BID BM  . fentaNYL  250 mcg Intravenous Once  . Fluticasone-Salmeterol  1 puff Inhalation BID  . guaiFENesin  600 mg Oral BID  . lidocaine  20 mL Mouth/Throat Once  . midazolam  10 mg Intravenous Once  . nicotine  21 mg Transdermal Daily  . predniSONE  40 mg Oral Daily  . sodium chloride  3 mL Intravenous Q12H  . DISCONTD: sodium chloride  3 mL Intravenous Q12H   Continuous Infusions:   . sodium chloride 500 mL (10/01/11 1056)   PRN Meds:.sodium chloride, acetaminophen, albuterol, benzocaine, benzonatate, guaiFENesin-codeine, ondansetron (ZOFRAN) IV,  sodium chloride, zolpidem, DISCONTD: sodium chloride, DISCONTD: fentaNYL, DISCONTD: lidocaine, DISCONTD: midazolam, DISCONTD: sodium chloride Assessment/Plan:  # Valvular disease and HOCM  Patient has had intermittent SOB in the past and reports syncope episodes with two recent episodes over the last 2 months. Patient reports she "just fell out." She is aware that she has a heart murmur which has never been evaluated. She was admitted for COPD exacerbation during this admission. On physical exam patient has a distinct holosystolic murmur.   2 D Echo indicates " Impressions: Likely flail or severely prolapsing posterior leaflet with severe MR and HOCM physiology. SAM with LVOT gradient not well defined".   Cardiac cath  Abdominal aortogram- small saccular aneurysm seen in the distal aorta. Widely patent iliac vessels.  IMPRESSIONS:  1. No angiographically significant coronary artery disease. 2. Normal left ventricular systolic function. LVEDP 14 mmHg. Ejection fraction 65-70 %. 3. Normal right-sided heart pressures. 4. Normal cardiac output.       5. No significant mitral regurgitation  present, no significant V wave on pulmonary capillary wedge pressure   TEE:  -EF 60% to 65%. - Mitral valve: Mildly thickened anterior leaflet. Severely thickened, myxomatous posterior leaflet with significant prolapse. Moderate to severe regurgitation directed eccentrically, mostly toward the septum. There are multiple MR jets noted.  PLAN - continue Cardiology and CVTS consult.    Appreciate Dr.Turner and Dr. Cornelius Moras recommendations. Appreciate Dr. Kristin Bruins consult. -consider beta blocker and avoid diuretics/dehydration per Dr. Cornelius Moras recommendation - follow up with Dr. Mayford Knife and Dr. Cornelius Moras as an outpt. ECHO every 6-12 months. - follow up with Dr. Kristin Bruins as outpatient for teeth extraction for prevention of bacterial endocarditis.  # COPD exacerbation, resolved. Patient presents with 5 day history of  productive cough, wheezing and SOB. And her symptoms have largely resolved.  Plan  - continue steroids prednisone  - Continue the ABX treatment with Doxycycline 100 mg po BID  - Continue the Neb treatment and Spriva for maintenance  -pending PFT report - Assessment for home O2 requirement upon discharge once she is cleared by cardiology.   # Hemoptysis: resolved  pt expectorated a very small amount of pink/maroon tinged sputum 09/28/11; no bright red blood. She is not experiencing any respiratory distress She does not have any known risk for TB and is hemodynamically stable. Her Coag studies are WNL, and her CBC is stable.  She has no more hemoptysis.  - continue with doxycycline and other treatment outlined above   # Diarrhea - resolved.  Patient has not had any diarrhea since admission, likely viral gastroenteritis.  C Diff unlikely given no recent abx use.   # Hypokalemia, resolved. Resolved. Mild hypokalemia is noted on admission and quickly resolved after K replacement.  The etiology is likely due to GI loss 2/2 diarrhea and using of Beta agonist.  - will continue to monitor.   # History of HTN -  BP currently WNL. Will monitor for now.   #. DVT PPX - SCDs   #. disposition:  #1 consider D/c today if it is ok with cardiology  #2.Upon discharge, Patient will need to continue Doxycycline and prednisone tapering dose. She will need albuterol and Spiriva inhalers along with Albuterol and Atrovent Neb treatment.  #3. I will set up her PCP through our Clinic for outpatient management. And she will need to follow up with Dr. Mayford Knife, Dr. Barry Dienes and Dr. Kristin Bruins.      LOS: 7 days   Toris Laverdiere 10/02/2011, 7:43 AM

## 2011-10-02 NOTE — Progress Notes (Signed)
   CARE MANAGEMENT NOTE 10/02/2011  Patient:  Katie [name], Katie [name]   Account Number:  [id number]  Date Initiated:  10/01/2011  Documentation initiated by:  Ronny Flurry  Subjective/Objective Assessment:   DX: COPD     Action/Plan:   anticipated heart valve surgery.  10/02/2011 Pt for d/c to home today, Will assist with meds and order HHN>   Anticipated DC Date:  10/02/2011   Anticipated DC Plan:  HOME/SELF CARE      DC Planning Services  Medication Assistance      Choice offered to / List presented to:     DME arranged  [facility]BUL[facility]      DME agency  [facility]        Status of service:  Completed, signed off Medicare Important Message given?   (If response is "NO", the following Medicare IM given date fields will be blank) Date Medicare IM given:   Date Additional Medicare IM given:    Discharge Disposition:  HOME/SELF CARE  Per UR Regulation:  Reviewed for med. necessity/level of care/duration of stay  Comments:

## 2011-10-03 ENCOUNTER — Encounter (HOSPITAL_COMMUNITY): Payer: Self-pay | Admitting: Pharmacy Technician

## 2011-10-03 ENCOUNTER — Other Ambulatory Visit (HOSPITAL_COMMUNITY): Payer: Self-pay | Admitting: Dentistry

## 2011-10-03 ENCOUNTER — Encounter (HOSPITAL_COMMUNITY): Payer: Self-pay | Admitting: *Deleted

## 2011-10-03 NOTE — Discharge Summary (Signed)
Internal Medicine Teaching [facility] Discharge Note  Name: Katie [name] MRN: [id number] DOB: 07/06/1946 65 y.o.  Date of Admission: 09/25/2011  4:53 PM Date of Discharge: 10/02/2011 Attending Physician: Staci Righter, MD  Discharge Diagnosis:   1. COPD exacerbation  2. COPD (chronic obstructive pulmonary disease)  3. Hemoptysis, 2/2 COPD, resolved.  3. Mitral valve prolapse   4. Mitral valve regurgitation  5. Hypertension  6. Hyperlipidemia  7. Viral gastroenteritis  8. Hypokalemia   Discharge Medications: Discharge Medication List as of 10/02/2011  3:09 PM    START taking these medications   Details  doxycycline (VIBRA-TABS) 100 MG tablet Take 1 tablet (100 mg total) by mouth every 12 (twelve) hours., Starting 10/02/2011, Until Sun 10/12/11, Print    nicotine (NICODERM CQ - DOSED IN MG/24 HOURS) 21 mg/24hr patch Place 1 patch (21 patches total) onto the skin daily., Starting 10/02/2011, Until Sat 11/01/11, Print    predniSONE (DELTASONE) 10 MG tablet 30 mg po daily for 3 days, then 20 mg po for 3 days, then 10 mg po daily for 3 days. Then stop., Print      CONTINUE these medications which have CHANGED   Details  albuterol (PROVENTIL) (5 MG/ML) 0.5% nebulizer solution Take 0.5 mLs (2.5 mg total) by nebulization every 4 (four) hours as needed for wheezing or shortness of breath., Starting 10/02/2011, Until Fri 10/01/12, Print    albuterol (PROVENTIL,VENTOLIN) 90 MCG/ACT inhaler Inhale 2 puffs into the lungs every 6 (six) hours as needed for wheezing., Starting 10/02/2011, Until Sun 09/26/12, Print    Fluticasone-Salmeterol (ADVAIR) 250-50 MCG/DOSE AEPB Inhale 1 puff into the lungs 2 (two) times daily., Starting 10/02/2011, Until Discontinued, Print    ipratropium (ATROVENT) 0.02 % nebulizer solution Take 2.5 mLs (500 mcg total) by nebulization 4 (four) times daily., Starting 10/02/2011, Until Fri 10/01/12, Print      STOP taking these medications     aspirin-sod  bicarb-citric acid (ALKA-SELTZER) 325 MG TBEF      guaiFENesin (MUCINEX) 600 MG 12 hr tablet         Disposition and follow-up:   Katie [name] was discharged from [facility] in Stable condition.    Follow-up Appointments: Follow-up Information    Follow up with Purcell Nails, MD on 11/24/2011. (at 1pm on 11/24/2011.    Please have your chest X ray done at 1230pm  at [facility] which is located  in the same building wiith Dr. Orvan July office.)    Contact information:   [address] [place] [address] [phone]        Follow up with Charlynne Pander, DDS on 10/06/2011. (for yoru teetch extraction)    Contact information:   [address] [place] [address] [phone]       Follow up with Quintella Reichert, MD in 3 weeks. (call office for an appointment)    Contact information:   [address] [place] [address] [place] [address] [phone]    Patient was found to have MR during this admission,  and she needs to have have regular follow up with cardiologist. Patient may need Betablocker which will be determined by Cardiologist     Follow up with TOBBIA,PATRICK on 10/09/2011. (245pm)    Contact information:   [address] [place] [address] [phone]            1. Patient will have all her teeth extracted by Dr. Rosana Berger to prevent bacterial  endocarditis.            Please follow up         2. Patient was found to have MR evaluated by echo and cardiac cath.             Please check her right groin for complete healing. Please follow up on her murmur.        3. Patient had PFTs during this admission and please follow up the report.        4. Patient wad discharged home with ABX and prednisone tapering dose. Please follow.   Discharge Orders    Future Appointments: Provider: Department: Dept Phone: Center:   10/09/2011 2:45 PM Darnelle Maffucci [facility] [phone] [facility]   11/24/2011 1:00 PM Purcell Nails, MD [facility] [phone] [facility]     Future Orders Please Complete By Expires   DME Nebulizer machine         Consultations: Treatment Team:  Quintella Reichert, MD Purcell Nails, MD Cindra Eves, DDS   Procedures Performed:  Dg Orthopantogram  09/30/2011  *RADIOLOGY REPORT*  Clinical Data: Evaluate for dental caries or abscess.  Pain in right front tooth.  DG ORTHOPANTOGRAM/PANORAMIC  Comparison: None.  Findings:  The patient has multiple missing teeth.  Probable lucency is seen in the right maxillary front tooth, both mandibular front teeth, and the third right mandibular tooth.  High-density material seen within the teeth and roots of two right mandibular teeth, suggesting prior dental intervention. Filling is seen in the posterior right mandibular molar.  No definite focal periapical lucencies.  IMPRESSION: 1. Findings suggestive of several dental caries. Suggest evaluation by a dentist.  2.  Multiple missing teeth.  Original Report Authenticated By: Britta Mccreedy, M.D.   Dg Chest 2 View  09/30/2011  *RADIOLOGY REPORT*  Clinical Data: Rule out CHF  CHEST - 2 VIEW  Comparison: Chest radiograph 09/25/2011  Findings: The heart, mediastinal, and hilar contours remain within normal limits.  Pulmonary vascularity is normal.  There is stable pleuroparenchymal scarring at both lung apices.  Negative for pulmonary edema, focal airspace disease, or pleural effusion.  No acute osseous abnormality.  Surgical clips in the right abdomen.  IMPRESSION: Stable exam.  No acute cardiopulmonary disease.  Original Report Authenticated By: Britta Mccreedy, M.D.   Dg Chest 2 View  09/25/2011  *RADIOLOGY REPORT*  Clinical Data: Chest pain with cough, congestion and shortness of breath for 4 days.  CHEST - 2 VIEW  Comparison: 02/05/2010.  Findings: The heart size and mediastinal contours are stable. There is stable biapical scarring.  No confluent airspace  opacity, edema or pleural effusion is present.  Osseous structures are stable.  There are multiple surgical clips in the right upper quadrant of the abdomen which are new, probably related to prior right nephrectomy.  IMPRESSION: No acute cardiopulmonary process.  Original Report Authenticated By: Gerrianne Scale, M.D.   2 D Echo indicates " Impressions: Likely flail or severely prolapsing posterior leaflet with severe MR and HOCM physiology. SAM with LVOT gradient not well defined".    Cardiac cath  Abdominal aortogram- small saccular aneurysm seen in the distal aorta. Widely patent iliac vessels.  IMPRESSIONS:  1. No angiographically significant coronary artery disease. 2. Normal left ventricular systolic function. LVEDP 14 mmHg. Ejection fraction 65-70 %. 3. Normal right-sided heart pressures. 4. Normal cardiac output. 5. No significant mitral regurgitation present, no significant V wave on pulmonary capillary wedge pressure  TEE:  -EF 60% to 65%. - Mitral valve: Mildly thickened anterior leaflet. Severely thickened, myxomatous posterior leaflet with significant prolapse. Moderate to severe regurgitation directed eccentrically, mostly toward the septum. There are multiple MR jets noted   Admission HPI:  Patient is a 65 y.o. female with a PMHx of emphysema diagnosed by chest x-ray in 2010, hypertension, hyperlipidemia, GERD who presents to [facility] for evaluation of shortness of breath and cough for the last 5 days. Patient states that she has been short of breath for the last week and that has progressively gotten worse over the last day. Patient came to emergency department yesterday at 4:30 PM to seek further evaluation. Of note patient states that she has not been on any inhalers she has not had a PCP to prescribe them. Patient complains of productive cough with white sputum. No fevers but does complain of chills. She notes that her husband has been sick. She reports that she was diagnosed  with emphysema based on a chest x-ray 2 years ago. She denies nausea, vomiting, constipation. However she does report diarrhea for the last 3 days. She states that she had 5 loose bowel movements yesterday. She denies been on recent antibiotic therapy. She also denies it being related to anything she has eaten. Additionally patient states that she has only drank coffee because she is afraid of diarrhea. She denies any urinary symptoms such as dysuria, urinary frequency or hesitancy.   Hospital Course by problem list:  1. COPD exacerbation    Patient has been everyday smoker for 50 years and she was diagnosed with COPD by X ray in the past. She has not had a PCP or any treatment in over 2 years. Patient presents with respiratory symptoms consistent with COPD exacerbation. She was treated with Oxygen, Neb, steroids and antibiotic therapy during this admission. Her symptoms have completely resolved upon discharge. She is stable at the time of discharge. Patient will be discharged home with prescriptions of Doxycycline and Prednisone tapering dose to finish her treatment course.    2. Transient Hemoptysis, 2/2 COPD, resolved.    3. Mitral valve prolapse and Mitral valve regurgitation      Patient has had intermittent SOB in the past and reports syncope episodes with two recent episodes over the last 2 months. Patient reports she "just fell out." She is aware that she has a heart murmur which has never been evaluated. She was admitted for COPD exacerbation during this admission. On physical exam patient has a distinct holosystolic murmur.            2 D Echo indicates " Impressions: Likely flail or severely prolapsing posterior leaflet with severe MR and HOCM physiology. SAM                       with LVOT gradient not well defined".         Cardiac cath                       Abdominal aortogram- small saccular aneurysm seen in the distal aorta. Widely patent iliac vessels.                        No  angiographically significant coronary artery disease.  Normal left ventricular systolic function. LVEDP 14 mmHg. Ejection fraction 65-70 %.                                                      Normal right-sided heart pressures.                                                            Normal cardiac output. No significant mitral regurgitation present, no significant V wave on PCWP        TEE:                      EF 60% to 65%.  Mitral valve: Mildly thickened anterior leaflet. Severely thickened, myxomatous posterior leaflet with                                        Significant prolapse. Moderate to severe regurgitation directed eccentrically, mostly toward the septum. There are multiple MR jets noted.           Cardiology and CVTS were consulted and recommended medical treatment. Patent will need to follow up with Cardiologist and CVTS surgeon as an outpatient. She will need 2D echo repeated every 6- 12 month per CVTS recommendation.  She will be evaluated for initiation of betablocker by cardiologist. Please avoid Diuretics and dehydration. She is stable at the time of discharge.    5. Hypertension and Hyperlipidemia, stable, will resume home regimen.   7. Viral gastroenteritis, resolved   8. Hypokalemia, resolved.  Discharge Vitals:  BP 91/60  Pulse 92  Temp(Src) 98.2 F (36.8 C) (Oral)  Resp 18  Ht 5\' 1"  (1.549 m)  Wt 141 lb 15.6 oz (64.4 kg)  BMI 26.83 kg/m2  SpO2 95%  Discharge Labs: No results found for this or any previous visit (from the past 24 hour(s)).  Signed: Abigaelle Verley 10/03/2011, 5:58 PM

## 2011-10-05 MED ORDER — CEFAZOLIN SODIUM 1-5 GM-% IV SOLN
1.0000 g | INTRAVENOUS | Status: AC
  Administered 2011-10-06: 1 g via INTRAVENOUS
  Filled 2011-10-05: qty 50

## 2011-10-06 ENCOUNTER — Encounter (HOSPITAL_COMMUNITY): Payer: Self-pay | Admitting: *Deleted

## 2011-10-06 ENCOUNTER — Encounter (HOSPITAL_COMMUNITY): Payer: Self-pay | Admitting: Anesthesiology

## 2011-10-06 ENCOUNTER — Ambulatory Visit (HOSPITAL_COMMUNITY): Payer: Self-pay | Admitting: Anesthesiology

## 2011-10-06 ENCOUNTER — Ambulatory Visit (HOSPITAL_COMMUNITY)
Admission: RE | Admit: 2011-10-06 | Discharge: 2011-10-06 | Disposition: A | Discharge status: Home / Self Care | Payer: Self-pay | Source: Ambulatory Visit | Attending: Dentistry | Admitting: Dentistry

## 2011-10-06 ENCOUNTER — Encounter (HOSPITAL_COMMUNITY)
Admission: RE | Disposition: A | Discharge status: Home / Self Care | Payer: Self-pay | Source: Ambulatory Visit | Attending: Dentistry

## 2011-10-06 DIAGNOSIS — I10 Essential (primary) hypertension: Secondary | ICD-10-CM | POA: Insufficient documentation

## 2011-10-06 DIAGNOSIS — K045 Chronic apical periodontitis: Secondary | ICD-10-CM

## 2011-10-06 DIAGNOSIS — K044 Acute apical periodontitis of pulpal origin: Secondary | ICD-10-CM | POA: Insufficient documentation

## 2011-10-06 DIAGNOSIS — K0530 Chronic periodontitis, unspecified: Secondary | ICD-10-CM

## 2011-10-06 DIAGNOSIS — K219 Gastro-esophageal reflux disease without esophagitis: Secondary | ICD-10-CM | POA: Insufficient documentation

## 2011-10-06 DIAGNOSIS — I059 Rheumatic mitral valve disease, unspecified: Secondary | ICD-10-CM | POA: Insufficient documentation

## 2011-10-06 DIAGNOSIS — J4489 Other specified chronic obstructive pulmonary disease: Secondary | ICD-10-CM | POA: Insufficient documentation

## 2011-10-06 DIAGNOSIS — J449 Chronic obstructive pulmonary disease, unspecified: Secondary | ICD-10-CM | POA: Insufficient documentation

## 2011-10-06 DIAGNOSIS — M278 Other specified diseases of jaws: Secondary | ICD-10-CM

## 2011-10-06 DIAGNOSIS — K0889 Other specified disorders of teeth and supporting structures: Secondary | ICD-10-CM | POA: Diagnosis present

## 2011-10-06 HISTORY — PX: MULTIPLE EXTRACTIONS WITH ALVEOLOPLASTY: SHX5342

## 2011-10-06 SURGERY — MULTIPLE EXTRACTION WITH ALVEOLOPLASTY
Anesthesia: General | Site: Mouth | Wound class: Clean Contaminated

## 2011-10-06 MED ORDER — NEOSTIGMINE METHYLSULFATE 1 MG/ML IJ SOLN
INTRAMUSCULAR | Status: DC | PRN
  Administered 2011-10-06: 3 mg via INTRAVENOUS

## 2011-10-06 MED ORDER — OXYMETAZOLINE HCL 0.05 % NA SOLN
NASAL | Status: AC
  Filled 2011-10-06: qty 15

## 2011-10-06 MED ORDER — FENTANYL CITRATE 0.05 MG/ML IJ SOLN
INTRAMUSCULAR | Status: DC | PRN
  Administered 2011-10-06: 50 ug via INTRAVENOUS
  Administered 2011-10-06: 100 ug via INTRAVENOUS
  Administered 2011-10-06: 50 ug via INTRAVENOUS

## 2011-10-06 MED ORDER — OXYMETAZOLINE HCL 0.05 % NA SOLN
1.0000 | Freq: Two times a day (BID) | NASAL | Status: DC
  Administered 2011-10-06: 2 via NASAL
  Filled 2011-10-06: qty 15

## 2011-10-06 MED ORDER — DROPERIDOL 2.5 MG/ML IJ SOLN: 0.6250 mg | INTRAMUSCULAR | Status: DC | PRN

## 2011-10-06 MED ORDER — BUPIVACAINE-EPINEPHRINE 0.5% -1:200000 IJ SOLN
INTRAMUSCULAR | Status: DC | PRN
  Administered 2011-10-06: 3.6 mL

## 2011-10-06 MED ORDER — PROPOFOL 10 MG/ML IV EMUL
INTRAVENOUS | Status: DC | PRN
  Administered 2011-10-06: 170 mg via INTRAVENOUS

## 2011-10-06 MED ORDER — LIDOCAINE-EPINEPHRINE 2 %-1:100000 IJ SOLN
INTRAMUSCULAR | Status: DC | PRN
  Administered 2011-10-06: 5.1 mL

## 2011-10-06 MED ORDER — HYDROMORPHONE HCL PF 1 MG/ML IJ SOLN
0.2500 mg | INTRAMUSCULAR | Status: DC | PRN
  Administered 2011-10-06 (×2): 0.5 mg via INTRAVENOUS

## 2011-10-06 MED ORDER — ONDANSETRON HCL 4 MG/2ML IJ SOLN
INTRAMUSCULAR | Status: DC | PRN
  Administered 2011-10-06: 4 mg via INTRAVENOUS

## 2011-10-06 MED ORDER — EPHEDRINE SULFATE 50 MG/ML IJ SOLN
INTRAMUSCULAR | Status: DC | PRN
  Administered 2011-10-06: 10 mg via INTRAVENOUS

## 2011-10-06 MED ORDER — GLYCOPYRROLATE 0.2 MG/ML IJ SOLN
INTRAMUSCULAR | Status: DC | PRN
  Administered 2011-10-06: .4 mg via INTRAVENOUS

## 2011-10-06 MED ORDER — ROCURONIUM BROMIDE 100 MG/10ML IV SOLN
INTRAVENOUS | Status: DC | PRN
  Administered 2011-10-06: 40 mg via INTRAVENOUS

## 2011-10-06 MED ORDER — MIDAZOLAM HCL 5 MG/5ML IJ SOLN
INTRAMUSCULAR | Status: DC | PRN
  Administered 2011-10-06: 2 mg via INTRAVENOUS

## 2011-10-06 MED ORDER — LACTATED RINGERS IV SOLN
INTRAVENOUS | Status: DC
  Administered 2011-10-06: 11:00:00 via INTRAVENOUS

## 2011-10-06 MED ORDER — OXYMETAZOLINE HCL 0.05 % NA SOLN
NASAL | Status: DC | PRN
  Administered 2011-10-06: 1 via NASAL

## 2011-10-06 SURGICAL SUPPLY — 36 items
ALCOHOL 70% 16 OZ (MISCELLANEOUS) ×2 IMPLANT
ATTRACTOMAT 16X20 MAGNETIC DRP (DRAPES) ×2 IMPLANT
BLADE SURG 15 STRL LF DISP TIS (BLADE) ×2 IMPLANT
BLADE SURG 15 STRL SS (BLADE) ×4
CLOTH BEACON ORANGE TIMEOUT ST (SAFETY) ×2 IMPLANT
COVER SURGICAL LIGHT HANDLE (MISCELLANEOUS) ×2 IMPLANT
CRADLE DONUT ADULT HEAD (MISCELLANEOUS) ×2 IMPLANT
GAUZE PACKING FOLDED 2  STR (GAUZE/BANDAGES/DRESSINGS) ×1
GAUZE PACKING FOLDED 2 STR (GAUZE/BANDAGES/DRESSINGS) ×1 IMPLANT
GAUZE SPONGE 4X4 16PLY XRAY LF (GAUZE/BANDAGES/DRESSINGS) ×2 IMPLANT
GLOVE SURG ORTHO 8.0 STRL STRW (GLOVE) ×2 IMPLANT
GLOVE SURG SS PI 6.5 STRL IVOR (GLOVE) ×2 IMPLANT
GOWN STRL REIN 3XL LVL4 (GOWN DISPOSABLE) ×2 IMPLANT
HEMOSTAT SURGICEL .5X2 ABSORB (HEMOSTASIS) IMPLANT
KIT BASIN OR (CUSTOM PROCEDURE TRAY) ×2 IMPLANT
KIT ROOM TURNOVER OR (KITS) ×2 IMPLANT
MANIFOLD NEPTUNE WASTE (CANNULA) ×2 IMPLANT
NDL BLUNT 16X1.5 OR ONLY (NEEDLE) ×1 IMPLANT
NDL DENTAL 27 LONG (NEEDLE) IMPLANT
NEEDLE BLUNT 16X1.5 OR ONLY (NEEDLE) ×2 IMPLANT
NEEDLE DENTAL 27 LONG (NEEDLE) IMPLANT
NS IRRIG 1000ML POUR BTL (IV SOLUTION) ×2 IMPLANT
PACK EENT II TURBAN DRAPE (CUSTOM PROCEDURE TRAY) ×2 IMPLANT
PAD ARMBOARD 7.5X6 YLW CONV (MISCELLANEOUS) ×3 IMPLANT
SPONGE SURGIFOAM ABS GEL 100 (HEMOSTASIS) IMPLANT
SPONGE SURGIFOAM ABS GEL 12-7 (HEMOSTASIS) IMPLANT
SPONGE SURGIFOAM ABS GEL SZ50 (HEMOSTASIS) IMPLANT
SUCTION FRAZIER TIP 10 FR DISP (SUCTIONS) ×2 IMPLANT
SUT CHROMIC 3 0 PS 2 (SUTURE) ×5 IMPLANT
SUT CHROMIC 4 0 P 3 18 (SUTURE) IMPLANT
SYR 50ML SLIP (SYRINGE) ×2 IMPLANT
TOWEL OR 17X24 6PK STRL BLUE (TOWEL DISPOSABLE) ×2 IMPLANT
TOWEL OR 17X26 10 PK STRL BLUE (TOWEL DISPOSABLE) ×2 IMPLANT
TUBE CONNECTING 12X1/4 (SUCTIONS) ×2 IMPLANT
WATER STERILE IRR 1000ML POUR (IV SOLUTION) ×2 IMPLANT
YANKAUER SUCT BULB TIP NO VENT (SUCTIONS) ×2 IMPLANT

## 2011-10-06 NOTE — Preoperative (Signed)
Beta Blockers   Reason not to administer Beta Blockers:Not Applicable 

## 2011-10-06 NOTE — Anesthesia Preprocedure Evaluation (Addendum)
Anesthesia Evaluation  Patient identified by MRN, date of birth, ID band Patient awake    Reviewed: Allergy & Precautions, H&P , NPO status , Patient's Chart, lab work & pertinent test results  Airway Mallampati: I TM Distance: >3 FB Neck ROM: Full    Dental  (+) Missing, Dental Advisory Given and Loose,    Pulmonary shortness of breath and with exertion, pneumonia , COPD COPD inhaler,  clear to auscultation  Pulmonary exam normal       Cardiovascular hypertension, Pt. on medications + Valvular Problems/Murmurs Regular Normal- Systolic murmurs    Neuro/Psych Negative Neurological ROS     GI/Hepatic Neg liver ROS, GERD-  Controlled,  Endo/Other  Negative Endocrine ROS  Renal/GU negative Renal ROS     Musculoskeletal   Abdominal   Peds  Hematology   Anesthesia Other Findings   Reproductive/Obstetrics                          Anesthesia Physical Anesthesia Plan  ASA: II  Anesthesia Plan: General   Post-op Pain Management:    Induction: Intravenous  Airway Management Planned: Nasal ETT and Oral ETT  Additional Equipment:   Intra-op Plan:   Post-operative Plan: Extubation in OR  Informed Consent:   Dental advisory given  Plan Discussed with: CRNA and Surgeon  Anesthesia Plan Comments:         Anesthesia Quick Evaluation

## 2011-10-06 NOTE — Transfer of Care (Signed)
Immediate Anesthesia Transfer of Care Note  Patient: Arnisha Laffoon  Procedure(s) Performed:  MULTIPLE EXTRACION WITH ALVEOLOPLASTY - Multiple extraction of tooth #'s 1, 6, 8, 10, 11, 22, 23, 26, 27, 28, 29 with alveoloplasty and Upper right buccal exostoses reductions.  Patient Location: PACU  Anesthesia Type: General  Level of Consciousness: awake and oriented  Airway & Oxygen Therapy: Patient Spontanous Breathing and Patient connected to face mask oxygen  Post-op Assessment: Report given to PACU RN  Post vital signs: Reviewed and stable  Complications: No apparent anesthesia complications

## 2011-10-06 NOTE — H&P (Signed)
H&P Reviewed. Pt re-examined. No changes.  

## 2011-10-06 NOTE — Op Note (Signed)
Patient:            Katie [name] Date of Birth:  Feb 28, 1946 MRN:                [id number]    DATE OF PROCEDURE: 10/06/2011        OPERATIVE REPORT   PREOPERATIVE DIAGNOSES: 1. Mitral valve regurgitation 2. Pre-mitral valve heart surgery dental protocol  3. Chronic apical periodontitis 4. Chronic periodontitis 5. Multiple mobile  teeth 6. Maxillary right buccal exostoses   POSTOPERATIVE DIAGNOSES: 1. Mitral valve regurgitation 2. Pre-mitral valve heart surgery dental protocol  3. Chronic apical periodontitis 4. Chronic periodontitis 5. Multiple mobile  teeth 6. Maxillary right buccal exostoses    OPERATIONS: 1. Multiple extraction of tooth numbers 1, 6, 8, 10, 11, 22, 23, 26, 27, 28, and 29. 2. Four Quadrants of alveoloplasty 3. Maxillary right buccal exostoses reductions    SURGEON: Charlynne Pander, DDS  ASSISTANT: Zettie Pho, (dental assistant)  ANESTHESIA: General anesthesia via nasoendotracheal tube.  MEDICATIONS: 1. 1 g IV Ancef prior to invasive dental procedures 2. Local anesthesia with a total utilization of 3 carpules each containing 34 mg of lidocaine with 0.017 mg of epinephrine as well as 2 carpules each containing 9 mg of bupivacaine with 0.009 mg of epinephrine.  SPECIMENS: There are eleven teeth that were discarded.  DRAINS: None  CULTURES: None  COMPLICATIONS: None   ESTIMATED BLOOD LOSS: 50 mls.  INTRAVENOUS FLUIDS: 600 mLs of Lactated ringers solution.  INDICATIONS: The patient was recently diagnosed with severe mitral regurgitation.  A dental consultation was then requested to rule out dental infection that may affect the patient's systemic health and anticipated heart valve surgery .  The patient was examined and treatment planned for multiple extractions with alveoloplasty and pre-prosthetic surgery as indicated. This treatment plan was formulated to decrease the risks and complications associated with dental infection from  affect in the patient's systemic health anticipated heart valve surgery.   OPERATIVE FINDINGS: Patient was examined operating room number 3.  Teeth were identified for extraction. The patient was noted be affected by chronic periodontitis, apical periodontitis, multiple mobile teeth, and maxillary right buccal exostoses.   DESCRIPTION OF PROCEDURE: Patient was brought to the main operating room number 3. Patient was then placed in the supine position on the operating table. General Anesthesia was then induced per the anesthesia team. The patient was then prepped and draped in the usual manner for dental medicine procedure. A timeout was performed. The patient was identified and procedures were verified. A throat pack was placed at this time. The oral cavity was then thoroughly examined with the findings noted above. The patient was then ready for dental medicine procedure as follows:  Local anesthesia was then administered sequentially with a total utilization of 3 carpules each containing 34 mg of lidocaine with 0.017 mg of epinephrine as well as  two carpules  each containing 9 mg bupivacaine with 0.009 mg of epinephrine.  The Maxillary left and right quadrants first approached. Anesthesia was then delivered utilizing infiltration with lidocaine with epinephrine. A #15 blade incision was then made from the maxillary right tuberosity and extended to the distal of #13.  A  surgical flap was then carefully reflected. Appropriate amounts of buccal and interseptal bone were then removed utilizing a surgical handpiece and bur and copious amounts of sterile saline.  The teeth were then subluxated with a series of straight elevators. Tooth numbers 1, 6, 8, 10, 11 were then removed with  a 150 forceps without complications. Alveoloplasty was then performed utilizing a ronguers and bone file. The maxillary right buccal exostoses were then removed utilizing a rongeur and bone file appropriately. The surgical site  was then irrigated with copious amounts of sterile saline. Tthe tissues were approximated and trimmed appropriately. The surgical site was then closed and the maxillary right tuberosity and extended to the mesial a #8 utilizing 3-0 chromic gut suture in a continuous to suture technique x1. The maxillary left surgical site was then closed from the distal of #30 and extended the mesial #utilizing 3-0 chromic gut suture in a continuous subcutaneous suture technique x1. 3 individual interrupted sutures were then placed at further closed surgical site as indicated.  At this point time, the mandibular quadrants were approached. The patient was given bilateral inferior alveolar nerve blocks and long buccal nerve blocks utilizing the bupivacaine with epinephrine. Further infiltration was then achieved utilizing the lidocaine with epinephrine. A 15 blade incision was then made from the distal of number 20 and extended to the distal of #30.  A surgical flap was then carefully reflected. Appropriate amounts of buccal and interseptal bone were then removed appropriately. Tooth numbers 22, 23, 26, 27, 28, and 29 were then removed utilizing a 151 forceps. Alveoloplasty was then performed utilizing a rongeur and bone file. The tissues were approximated and trimmed appropriately. The surgical sites were then irrigated with copious amounts of sterile saline. The surgical site was then closed from the distal of #20 and extended to the mesial of #24 utilizing 3-0 chromic gut suture in condition to suture technique x1. The mandibular right surgical site was then closed from the distal of #30 and extended the mesial numbers 25 utilizing 3-0 chromic gut suture in a continuous suture technique x1.  At this point time, the entire mouth was irrigated with copious amounts of sterile saline. The patient was exam for complications, seeing none, the dental medicine procedure was deemed to be complete. The throat pack was removed at this  time. A series of 4 x 4 gauze were placed in the mouth to aid hemostasis. The patient was then handed over to the anesthesia team for final disposition. After an appropriate amount of time, the patient was extubated and taken to the postanesthsia care unit with stable vital signs and a good condition. All counts were correct for the dental medicine procedure. Patient was seen approximately 7-10 days for evaluation for suture removal. Patient to be ready for heart valve surgery at the discretion of his heart surgeon is no postop complications occur.   Charlynne Pander, DDS.

## 2011-10-06 NOTE — Anesthesia Postprocedure Evaluation (Signed)
Anesthesia Post Note  Patient: Katie [name]  Procedure(s) Performed:  MULTIPLE EXTRACION WITH ALVEOLOPLASTY - Multiple extraction of tooth #'s 1, 6, 8, 10, 11, 22, 23, 26, 27, 28, 29 with alveoloplasty and Upper right buccal exostoses reductions.  Anesthesia type: General  Patient location: PACU  Post pain: Pain level controlled  Post assessment: Patient's Cardiovascular Status Stable  Last Vitals:  Filed Vitals:   10/06/11 1330  BP:   Pulse: 88  Temp: 36.4 C  Resp:     Post vital signs: Reviewed and stable  Level of consciousness: sedated  Complications: No apparent anesthesia complications

## 2011-10-07 ENCOUNTER — Encounter (HOSPITAL_COMMUNITY): Payer: Self-pay | Admitting: Dentistry

## 2011-10-08 NOTE — Op Note (Signed)
Please see report from [employer].  Katie [name],Katie S. 10/08/2011

## 2011-10-09 ENCOUNTER — Other Ambulatory Visit: Payer: Self-pay | Admitting: Internal Medicine

## 2011-10-09 ENCOUNTER — Ambulatory Visit (INDEPENDENT_AMBULATORY_CARE_PROVIDER_SITE_OTHER): Payer: 59 | Admitting: Internal Medicine

## 2011-10-09 ENCOUNTER — Encounter: Payer: Self-pay | Admitting: Internal Medicine

## 2011-10-09 DIAGNOSIS — Z23 Encounter for immunization: Secondary | ICD-10-CM

## 2011-10-09 DIAGNOSIS — I10 Essential (primary) hypertension: Secondary | ICD-10-CM

## 2011-10-09 DIAGNOSIS — Z1231 Encounter for screening mammogram for malignant neoplasm of breast: Secondary | ICD-10-CM

## 2011-10-09 DIAGNOSIS — E785 Hyperlipidemia, unspecified: Secondary | ICD-10-CM

## 2011-10-09 DIAGNOSIS — G4700 Insomnia, unspecified: Secondary | ICD-10-CM

## 2011-10-09 DIAGNOSIS — J449 Chronic obstructive pulmonary disease, unspecified: Secondary | ICD-10-CM

## 2011-10-09 DIAGNOSIS — Z299 Encounter for prophylactic measures, unspecified: Secondary | ICD-10-CM

## 2011-10-09 DIAGNOSIS — Z139 Encounter for screening, unspecified: Secondary | ICD-10-CM | POA: Insufficient documentation

## 2011-10-09 LAB — COMPREHENSIVE METABOLIC PANEL
ALT: 13 U/L (ref 0–35)
Alkaline Phosphatase: 71 U/L (ref 39–117)
Sodium: 139 mEq/L (ref 135–145)
Total Bilirubin: 0.4 mg/dL (ref 0.3–1.2)
Total Protein: 6.5 g/dL (ref 6.0–8.3)

## 2011-10-09 LAB — LIPID PANEL
HDL: 78 mg/dL (ref >39)
LDL Cholesterol: 147 mg/dL — ABNORMAL HIGH (ref 0–99)
Total CHOL/HDL Ratio: 3.4 Ratio

## 2011-10-09 MED ORDER — ZOLPIDEM TARTRATE 5 MG PO TABS: 5.0000 mg | ORAL_TABLET | Freq: Every evening | ORAL | Status: DC | PRN

## 2011-10-09 NOTE — Progress Notes (Signed)
Subjective:     Patient ID: Katie [name], female   DOB: 09-Apr-1946, 65 y.o.   MRN: [id number]  HPI  Patient is a 65 year old female with a past medical history listed below, presents to the outpatient clinic for a hospital followup after she was admitted for COPD exacerbation, she reports that she has completed all her prednisone taper and antibiotics. She does report side effects from steroid consumption which includes insomnia, lightheadedness, headaches and restlessness. I discussed with the patient and informed her that these will subside several days after her prednisone sensation. Patient also requires health maintenance. Denies any other complaints.  Patient Active Problem List  Diagnoses  . COPD (chronic obstructive pulmonary disease)  . COPD exacerbation  . HTN (hypertension)  . HLD (hyperlipidemia)  . Renal cell carcinoma  . S/P cholecystectomy  . Murmur  . Mitral valve regurgitation   Current Outpatient Prescriptions on File Prior to Visit  Medication Sig Dispense Refill  . albuterol (PROVENTIL) (5 MG/ML) 0.5% nebulizer solution Take 0.5 mLs (2.5 mg total) by nebulization every 4 (four) hours as needed for wheezing or shortness of breath.  20 mL  2  . albuterol (PROVENTIL,VENTOLIN) 90 MCG/ACT inhaler Inhale 2 puffs into the lungs every 6 (six) hours as needed for wheezing.  17 g  12  . doxycycline (VIBRA-TABS) 100 MG tablet Take 1 tablet (100 mg total) by mouth every 12 (twelve) hours.  8 tablet  0  . Fluticasone-Salmeterol (ADVAIR) 250-50 MCG/DOSE AEPB Inhale 1 puff into the lungs 2 (two) times daily.  60 each  1  . ibuprofen (ADVIL,MOTRIN) 200 MG tablet Take 400 mg by mouth every 6 (six) hours as needed. For pain        . ipratropium (ATROVENT) 0.02 % nebulizer solution Take 2.5 mLs (500 mcg total) by nebulization 4 (four) times daily.  75 mL  12  . nicotine (NICODERM CQ - DOSED IN MG/24 HOURS) 21 mg/24hr patch Place 1 patch (21 patches total) onto the skin daily.  21 patch   0  . predniSONE (DELTASONE) 10 MG tablet 30 mg po daily for 3 days, then 20 mg po for 3 days, then 10 mg po daily for 3 days. Then stop.  18 tablet  0   Not on File   Review of Systems  All other systems reviewed and are negative.       Objective:   Physical Exam  Nursing note and vitals reviewed. Constitutional: She is oriented to person, place, and time. She appears well-developed and well-nourished.  HENT:  Head: Normocephalic and atraumatic.  Eyes: Pupils are equal, round, and reactive to light.  Neck: Normal range of motion. Neck supple. No JVD present. No thyromegaly present.  Cardiovascular: Normal rate and regular rhythm.  Exam reveals no gallop and no friction rub.   Murmur heard. Pulmonary/Chest: Effort normal and breath sounds normal. She has no wheezes. She has no rales.  Abdominal: Soft. Bowel sounds are normal.  Musculoskeletal: Normal range of motion. She exhibits no edema.  Neurological: She is alert and oriented to person, place, and time.  Skin: Skin is warm and dry.

## 2011-10-09 NOTE — Assessment & Plan Note (Signed)
Diet controlled, blood pressure is within goal

## 2011-10-09 NOTE — Assessment & Plan Note (Signed)
Breathing at baseline, continue current regimen of Advair, albuterol, Atrovent

## 2011-10-09 NOTE — Patient Instructions (Signed)
Please take Ambien as needed for insomnia.  Please stop taking your prednisone.

## 2011-10-09 NOTE — Assessment & Plan Note (Signed)
Currently not on statins, will check fasting lipids if LDL is more than 130, will initiate a statin

## 2011-10-09 NOTE — Assessment & Plan Note (Signed)
Place an order for screening mammogram and tetanus vaccination today, otherwise patient refuses colonoscopy or Pap smear

## 2011-10-09 NOTE — Progress Notes (Signed)
Pt aware of mammogram [facility] 11/12/11 9:45AM. Stanton Kidney Hero Kulish RN 10/09/11 3:55PM

## 2011-10-10 MED ORDER — PRAVASTATIN SODIUM 40 MG PO TABS: 40.0000 mg | ORAL_TABLET | Freq: Every day | ORAL | Status: DC

## 2011-10-10 NOTE — Progress Notes (Signed)
Addended by: Darnelle Maffucci on: 10/10/2011 01:32 PM   Modules accepted: Orders

## 2011-10-23 ENCOUNTER — Ambulatory Visit (HOSPITAL_COMMUNITY): Payer: Self-pay | Admitting: Dentistry

## 2011-10-23 VITALS — BP 124/82 | HR 72 | Temp 97.6°F

## 2011-10-23 DIAGNOSIS — K0820 Unspecified atrophy of edentulous alveolar ridge: Secondary | ICD-10-CM

## 2011-10-23 DIAGNOSIS — K08109 Complete loss of teeth, unspecified cause, unspecified class: Secondary | ICD-10-CM

## 2011-10-23 DIAGNOSIS — M270 Developmental disorders of jaws: Secondary | ICD-10-CM

## 2011-10-23 NOTE — Progress Notes (Signed)
Thursday, October 23, 2011   BP: 124/82           P: 72           T: 97.6  Katie [name] presents for periodic oral examination status post extractions on 10/06/11.. Subjective: Patient without complaints. No sutures remaining. Exam: There is no sign of infection, heme, or ooze. Sutures are all gone. Patient with generalized primary closure. Atrophy of the edentulous alveolar ridges is noted. 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 Assessment: Postop course is consistent with dental extractions in the operating room. Plan/recommendations: 1. Patient to continue saltwater rinses to aid healing 2. Patient wishes to followup with fabrication of upper and lower complete dentures with dental medicine. Procedure will start in early January 2013. Patient agrees to Price quote provided. Patient is aware of the limitations of successful denture fabrication due to the atrophy of the edentulous alveolar ridges and the presence of the palatal torus without reduction. Patient indicates that she accepts the decreased prognosis and agrees to proceed with upper and lower complete denture fabrication at this time. 3. Call if problems arise. Return to clinic as scheduled. Dr. Kristin Bruins

## 2011-11-03 ENCOUNTER — Ambulatory Visit (HOSPITAL_COMMUNITY): Payer: 59 | Admitting: Dentistry

## 2011-11-03 VITALS — BP 125/71 | HR 70 | Temp 97.1°F

## 2011-11-03 DIAGNOSIS — M278 Other specified diseases of jaws: Secondary | ICD-10-CM

## 2011-11-03 DIAGNOSIS — K08109 Complete loss of teeth, unspecified cause, unspecified class: Secondary | ICD-10-CM

## 2011-11-03 DIAGNOSIS — M270 Developmental disorders of jaws: Secondary | ICD-10-CM

## 2011-11-03 DIAGNOSIS — K0820 Unspecified atrophy of edentulous alveolar ridge: Secondary | ICD-10-CM

## 2011-11-03 NOTE — Progress Notes (Signed)
Monday- November 03, 2011  BP:  125/71            P: 70             T: 97.1    Katie [name] presents for start of upper and lower complete denture fabrication. Exam: Pt.is edentulous. Patient has palatal torus near the vibrating line. Discussed procedures involved in upper and lower complete denture fabrication and prognosis for successful ability to wear dentures. Price for dentures confirmed. Patient  agrees to proceed with upper and lower complete denture fabrication. Will consider palatal torus reduction ir implants with oral surgeon if unable to wear upper or lower denture successfully. Procedure: Upper and  lower complete primary impressions in alginate. Lab pour. To [facility] for custom tray fabrication. RTC for final impressions. Dr. Kristin Bruins

## 2011-11-12 ENCOUNTER — Ambulatory Visit (HOSPITAL_COMMUNITY): Payer: Self-pay | Attending: Internal Medicine

## 2011-11-13 ENCOUNTER — Ambulatory Visit (HOSPITAL_COMMUNITY): Payer: Self-pay | Admitting: Dentistry

## 2011-11-13 ENCOUNTER — Encounter (HOSPITAL_COMMUNITY): Payer: Self-pay | Admitting: Dentistry

## 2011-11-13 VITALS — BP 143/100 | HR 74 | Temp 97.4°F

## 2011-11-13 DIAGNOSIS — Z463 Encounter for fitting and adjustment of dental prosthetic device: Secondary | ICD-10-CM

## 2011-11-13 DIAGNOSIS — K08109 Complete loss of teeth, unspecified cause, unspecified class: Secondary | ICD-10-CM

## 2011-11-13 DIAGNOSIS — K0820 Unspecified atrophy of edentulous alveolar ridge: Secondary | ICD-10-CM

## 2011-11-13 NOTE — Progress Notes (Signed)
Thursday, November 13, 2011   BP: 143/74             P: 74            T: 97.4  Katie [name] presents for continued upper and lower complete denture fabrication. Procedure: Upper and lower complete denture border molding and final impressions in Aquasil. Patient tolerated procedure well. To [facility] for custom baseplates with rims. Return to clinic for upper and lower complete denture jaw relations.  Dr. Cindra Eves

## 2011-11-21 ENCOUNTER — Other Ambulatory Visit: Payer: Self-pay | Admitting: Thoracic Surgery (Cardiothoracic Vascular Surgery)

## 2011-11-21 DIAGNOSIS — I340 Nonrheumatic mitral (valve) insufficiency: Secondary | ICD-10-CM

## 2011-11-24 ENCOUNTER — Encounter: Payer: Self-pay | Admitting: Thoracic Surgery (Cardiothoracic Vascular Surgery)

## 2011-11-24 ENCOUNTER — Ambulatory Visit (INDEPENDENT_AMBULATORY_CARE_PROVIDER_SITE_OTHER): Payer: Self-pay | Admitting: Thoracic Surgery (Cardiothoracic Vascular Surgery)

## 2011-11-24 ENCOUNTER — Ambulatory Visit
Admission: RE | Admit: 2011-11-24 | Discharge: 2011-11-24 | Disposition: A | Discharge status: Home / Self Care | Payer: No Typology Code available for payment source | Source: Ambulatory Visit | Attending: Thoracic Surgery (Cardiothoracic Vascular Surgery) | Admitting: Thoracic Surgery (Cardiothoracic Vascular Surgery)

## 2011-11-24 VITALS — BP 142/95 | HR 90 | Resp 20 | Ht 61.0 in | Wt 150.0 lb

## 2011-11-24 DIAGNOSIS — I340 Nonrheumatic mitral (valve) insufficiency: Secondary | ICD-10-CM

## 2011-11-24 DIAGNOSIS — I059 Rheumatic mitral valve disease, unspecified: Secondary | ICD-10-CM

## 2011-11-24 NOTE — Patient Instructions (Signed)
Schedule an appointment with your cardiologist.   

## 2011-11-24 NOTE — Progress Notes (Signed)
[address]            Katie [name] [address]          [phone]     CARDIOTHORACIC SURGERY CONSULTATION REPORT  PCP is Katie Maffucci, MD, MD Referring Provider is Katie Magic, MD  Chief Complaint  Patient presents with  . Mitral Regurgitation    F/u from Hospital consult    HPI:  Patient returns to the office for followup of mitral regurgitation. She was originally seen in consultation on 09/29/2011 at which time she was hospitalized at [facility] for shortness of breath and atypical chest pain. After I saw her in consultation she underwent both catheterization and transesophageal echocardiogram. Cardiac catheterization revealed normal coronary artery anatomy with no significant coronary artery disease. There was only mild mitral regurgitation that catheterization and right heart pressures were normal.. Transesophageal echocardiogram confirmed the presence of mitral valve prolapse but there was at most moderate mitral regurgitation. There was some asymmetric septal hypertrophy and there was a possibility for the presence of dynamic left ventricular output tract obstruction, although there were no hemodynamic findings consistent with true LV outflow tract obstruction.  Medical therapy was recommended.  She returns to the office for followup today. She has not been seen in followup by Dr. Mayford Knife. The patient reports that she's doing quite well. She managed to quit smoking and as a result her breathing seems to be much improved. She still has some atypical chest discomfort described as dull pressure-like pain across the left anterior chest that seems to come and goes sporadically. She denies shortness of breath. She denies productive cough. The remainder of her review of systems is unremarkable.   Past Medical History  Diagnosis Date  . Arthritis   . COPD (chronic obstructive pulmonary disease)     no history of PFTs, only diagnosed by  CXR, not on any inhalers because she has not had a PCP in over 2 years  . Hypertension     previously on Lisinopril/HCTZ  . History of heartburn   . Hypercholesteremia     previously on pravastatin   . Cholelithiasis 2011    s/p cholescystectomy   . Renal cell carcinoma 01/2010    s/p right radical nephrectomy 01/2010,  followed by [facility]  . Murmur     no  prior work-up  . GERD (gastroesophageal reflux disease)   . Emphysema 2010    dx'd/CXR  . Shortness of breath 09/26/11    "lately all the time"  . Pneumonia 1980's    "walking"    Past Surgical History  Procedure Date  . Cholecystectomy 01/2010  . Nephrectomy radical 01/2010    right   . Tubal ligation 1970's  . Diagnostic laparoscopy   . Ovarian cyst removal 1970's    "went through belly button"  . Appendectomy 1970's  . Tee without cardioversion 10/01/2011    Procedure: TRANSESOPHAGEAL ECHOCARDIOGRAM (TEE);  Surgeon: Corky Crafts;  Location: [facility] ENDOSCOPY;  Service: Cardiovascular;  Laterality: N/A;  . US echocardiography 09/2011  . Cardiac catheterization 09/2011  . Multiple extractions with alveoloplasty 10/06/2011    Procedure: MULTIPLE EXTRACION WITH ALVEOLOPLASTY;  Surgeon: Charlynne Pander, DDS;  Location: [facility] OR;  Service: Oral Surgery;  Laterality: N/A;  Multiple extraction of tooth #'s 1, 6, 8, 10, 11, 22, 23, 26, 27, 28, 29 with alveoloplasty and Upper right buccal exostoses  reductions.    Family History  Problem Relation Age of Onset  . COPD Mother     DECEASED    Social History History  Substance Use Topics  . Smoking status: Former Smoker -- 1.0 packs/day for 50 years    Types: Cigarettes    Quit date: 09/25/2011  . Smokeless tobacco: Never Used   Comment: smoking cessation consult entered  . Alcohol Use: Yes      drink very rarely"    Current Outpatient Prescriptions  Medication Sig Dispense Refill  . albuterol (PROVENTIL) (5 MG/ML) 0.5% nebulizer solution Take 0.5 mLs (2.5  mg total) by nebulization every 4 (four) hours as needed for wheezing or shortness of breath.  20 mL  2  . albuterol (PROVENTIL,VENTOLIN) 90 MCG/ACT inhaler Inhale 2 puffs into the lungs every 6 (six) hours as needed for wheezing.  17 g  12  . ibuprofen (ADVIL,MOTRIN) 200 MG tablet Take 400 mg by mouth every 6 (six) hours as needed. For pain        . ipratropium (ATROVENT) 0.02 % nebulizer solution Take 2.5 mLs (500 mcg total) by nebulization 4 (four) times daily.  75 mL  12  . pravastatin (PRAVACHOL) 40 MG tablet Take 1 tablet (40 mg total) by mouth daily.  30 tablet  10  . zolpidem (AMBIEN) 5 MG tablet Take 1 tablet (5 mg total) by mouth at bedtime as needed for sleep.  30 tablet  0  . Fluticasone-Salmeterol (ADVAIR) 250-50 MCG/DOSE AEPB Inhale 1 puff into the lungs 2 (two) times daily.  60 each  1    No Known Allergies  Review of Systems:  General:  normal appetite, normal energy   Respiratory:  no cough, no wheezing, no hemoptysis, no pain with inspiration or cough, no shortness of breath   Cardiac:  + occasional chest pain or tightness that does not seem to be related to exertion, no exertional SOB, no resting SOB, no PND, no orthopnea, no LE edema, no palpitations, no syncope  GI:   no difficulty swallowing, no hematochezia, no hematemesis, no melena, no constipation, no diarrhea   GU:   no dysuria, no urgency, no frequency   Musculoskeletal: no arthritis, no arthralgia   Vascular:  no pain suggestive of claudication   Neuro:   no symptoms suggestive of TIA's, no seizures, no headaches, no peripheral neuropathy   Endocrine:  Negative   HEENT:  Recovered following dental extraction,  no recent vision changes  Psych:   no anxiety, no depression    Physical Exam:   BP 142/95  Pulse 90  Resp 20  Ht 5\' 1"  (1.549 m)  Wt 150 lb (68.04 kg)  BMI 28.34 kg/m2  SpO2 99%  General:    well-appearing  HEENT:  Unremarkable   Neck:   no JVD, no bruits, no adenopathy   Chest:   clear to  auscultation, symmetrical breath sounds, no wheezes, no rhonchi   CV:   RRR, + grade II/VI systolic murmur   Abdomen:  soft, non-tender, no masses   Extremities:  warm, well-perfused, pulses   Rectal/GU  Deferred  Neuro:   Grossly non-focal and symmetrical throughout  Skin:   Clean and dry, no rashes, no breakdown   Diagnostic Tests:  Cardiac Catheterization:  HEMODYNAMICS:    Right atrium (RA): 11/10/9 mmHg Right ventricle (RV): 29/8/9 mmHg Pulmonary artery (PA): 26/12/18 mmHg Pulmonary capillary wedge pressure (PCWP): 13/11/11 mmHg  Cardiac output: Fick 6.0 liters/min, Thermodilution 4.5 liters/min Cardiac index: Fick  3.6, Thermodilution 2.7  PA saturation: 71%   FA saturation: 97 %   Aortic pressure: 153/96/122 mmHg LV pressure: 153/15 mmHg systolic; LVEDP 14 mmHg.    There was no gradient between the left ventricle and aorta.    ANGIOGRAPHIC DATA:    Left main: No angiographically significant coronary artery disease.  Left anterior descending (LAD): No angiographic significant coronary artery disease. His vessel is smaller in caliber than the circumflex vessel which is quite large.  Circumflex artery (CIRC): No angiographically significant coronary artery disease. Large caliber vessel, tortuous. Codominant  Right coronary artery (RCA): No angiographically significant coronary artery disease. Codominant vessel  LEFT VENTRICULOGRAM:  Left ventricular angiogram was done in the 30 RAO projection and revealed normal left ventricular wall motion and systolic function with an estimated ejection fraction of 65-70 %.  LVEDP was . Interestingly, no significant mitral regurgitation was detected.  Abdominal aortogram- small saccular aneurysm seen in the distal aorta. Widely patent iliac vessels.  IMPRESSIONS:    1. No angiographically significant coronary artery disease.  2. Normal left ventricular systolic function.  LVEDP 14 mmHg.  Ejection fraction 65-70 %.   3. Normal right-sided heart pressures.  4. Normal cardiac output. 5.  No significant mitral regurgitation present, no significant V wave on pulmonary capillary wedge pressure    Transesophageal Echocardiography  Patient:    Katie [name], Katie [name] MR #:       [id number] Study Date: 10/01/2011 Gender:     F Age:        66 Height:     154.9cm Weight:     64.1kg BSA:        1.53m^2 Pt. Status: Room:    PERFORMING   Everette Rank, MD  ADMITTING    Staci Righter  ATTENDING    Comer, Dorian Furnace, MD  SONOGRAPHER  Jeryl Columbia cc:  ------------------------------------------------------------ LV EF: 60% -   65%  ------------------------------------------------------------ History:   PMH:  polysubstance, smoker  Chronic obstructive pulmonary disease.  Risk factors:  Hypertension.  ------------------------------------------------------------ Study Conclusions  - Left ventricle: There was mild focal basal hypertrophy of   the septum. Systolic function was normal. The estimated   ejection fraction was in the range of 60% to 65%. - Mitral valve: Mildly thickened anterior leaflet. Severely   thickened, myxomatous posterior leaflet with significant   prolapse. Moderate to severe regurgitation directed   eccentrically, mostly toward the septum. There are   multiple MR jets noted. - Left atrium: No evidence of thrombus in the atrial cavity   or appendage. - Atrial septum: No defect or patent foramen ovale was   identified. Transesophageal echocardiography.  2D and color Doppler. Height:  Height: 154.9cm. Height: 61in.  Weight:  Weight: 64.1kg. Weight: 141lb.  Body mass index:  BMI: 26.7kg/m^2. Body surface area:    BSA: 1.71m^2.  Patient status: Inpatient.  Location:  Endoscopy.  ------------------------------------------------------------  ------------------------------------------------------------ Left ventricle:  There was mild focal basal hypertrophy  of the septum. Systolic function was normal. The estimated ejection fraction was in the range of 60% to 65%.  ------------------------------------------------------------ Aortic valve:   Trileaflet.  Doppler:   No regurgitation.   ------------------------------------------------------------ Aorta:  Mild, focal atherosclerosis of the descending aorta. Ascending aorta: The ascending aorta was normal in size.  ------------------------------------------------------------ Mitral valve:  Mildly thickened anterior leaflet. Severely thickened, myxomatous posterior leaflet with significant prolapse.  Doppler:  Moderate to severe regurgitation directed eccentrically, mostly toward the septum. There are multiple MR  jets noted.  ------------------------------------------------------------ Left atrium:   No evidence of thrombus in the atrial cavity or appendage.  ------------------------------------------------------------ Atrial septum:  No defect or patent foramen ovale was identified.  ------------------------------------------------------------ Pulmonary veins: Left upper pulmonary vein: Normal sized.  There was systolic blunting.  ------------------------------------------------------------ Tricuspid valve:   Mildly thickened leaflets.  Doppler: Trivial regurgitation.  ------------------------------------------------------------ Post procedure conclusions Ascending Aorta:  - Mild, focal atherosclerosis of the descending aorta.    Impression:  Mitral valve prolapse with probably moderate (2+/3+) mitral regurgitation. The patient has normal left ventricular systolic function and normal left ventricular chamber size. The patient does have asymmetric septal hypertrophy and perhaps a tendency for the development of dynamic left ventricular output tract obstruction with systolic anterior motion of the mitral valve. The patient has underlying chronic obstructive pulmonary disease and  long-standing tobacco abuse, although she managed to quit smoking after her recent hospitalization in December.  Based upon transesophageal echocardiogram at this point I do not feel that severity of mitral regurgitation as such that surgical intervention is indicated at this time. However, if the patient continues to have problems with exertional shortness of breath despite the fact that she is quit smoking we certainly could reconsider, particularly if there seems to be evidence suggestive of intermittent dynamic left ventricular output tract obstruction causing exacerbation of the severity of mitral regurgitation.   Plan:  I recommend continued close medical followup with intermittent transthoracic echocardiogram every 6-12 months. I've reminded the patient is schedule followup appointment with Dr. Mayford Knife. We'll plan to see the patient back in 6 months to see how she is doing.    Salvatore Decent. Cornelius Moras, MD 11/24/2011 2:58 PM

## 2011-11-25 ENCOUNTER — Ambulatory Visit (HOSPITAL_COMMUNITY): Payer: Self-pay | Admitting: Dentistry

## 2011-11-25 VITALS — BP 118/78 | HR 75 | Temp 97.4°F

## 2011-11-25 DIAGNOSIS — Z463 Encounter for fitting and adjustment of dental prosthetic device: Secondary | ICD-10-CM

## 2011-11-25 DIAGNOSIS — K08109 Complete loss of teeth, unspecified cause, unspecified class: Secondary | ICD-10-CM

## 2011-11-25 NOTE — Progress Notes (Signed)
Tuesday, November 25, 2011  BP: 118/78           P: 75           T: 97.4   Sherre Lain presents for continued upper and lower complete denture fabrication. Procedure: Upper and lower complete denture Jaw relations with aluwax bite registration. Patient  agrees to tooth selection of 12E, N, 10 degree posteriors to match with Portrait A2 shade.  Patient tolerated procedure well. Return to clinic for upper and lower complete denture wax tryin. Dr. Cindra Eves

## 2011-12-03 ENCOUNTER — Ambulatory Visit (HOSPITAL_COMMUNITY): Payer: Self-pay | Admitting: Dentistry

## 2011-12-03 VITALS — BP 118/71 | HR 85 | Temp 98.0°F

## 2011-12-03 DIAGNOSIS — K08109 Complete loss of teeth, unspecified cause, unspecified class: Secondary | ICD-10-CM

## 2011-12-03 DIAGNOSIS — Z463 Encounter for fitting and adjustment of dental prosthetic device: Secondary | ICD-10-CM

## 2011-12-03 NOTE — Progress Notes (Signed)
Wednesday, December 03, 2011   BP: 118/71          P: 85          T: 98.0   Sherre Lain presents for continued upper and lower complete denture fabrication. Procedure: Upper and lower complete denture wax tryin. Patient accepts esthetics, phonetics, fit and function. Patient agrees to process "as is" in 50:50 [place] 199. Pt. to return to clinic for upper and lower complete denture insertion.  Dr. Cindra Eves

## 2011-12-11 ENCOUNTER — Ambulatory Visit (HOSPITAL_COMMUNITY): Payer: Self-pay | Admitting: Dentistry

## 2011-12-11 DIAGNOSIS — K030 Excessive attrition of teeth: Secondary | ICD-10-CM

## 2011-12-11 DIAGNOSIS — K08109 Complete loss of teeth, unspecified cause, unspecified class: Secondary | ICD-10-CM

## 2011-12-11 NOTE — Progress Notes (Signed)
Thursday, December 11, 2011   BP: 140/85              P: 73            T: 98.3   Katie [name] presents for insertion of upper and lower complete dentures. Procedure: Pressure indicating paste applied to upper and lower complete dentures. Adjustments made as needed. [place]. Occlusion evaluated and adjustments made as needed for centric relation and protrusive strokes. Good esthetics, phonetics, fit, and function noted. Patient accepts results. Postop instructions were provided in a written and verbal format on the use and care of the upper and lower complete dentures. Patient was given denture brush and cup. The patient is to keep dentures out if sore spots develop. Use salt water rinses as needed to aid healing. RTC as scheduled for denture adjustment. Call if problems arise before then. The patient was dismissed in stable condition.  Dr. Cindra Eves

## 2011-12-15 ENCOUNTER — Ambulatory Visit (HOSPITAL_COMMUNITY): Payer: Self-pay | Admitting: Dentistry

## 2011-12-15 VITALS — BP 118/84 | HR 65 | Temp 96.8°F

## 2011-12-15 DIAGNOSIS — K062 Gingival and edentulous alveolar ridge lesions associated with trauma: Secondary | ICD-10-CM

## 2011-12-15 DIAGNOSIS — K1370 Unspecified lesions of oral mucosa: Secondary | ICD-10-CM

## 2011-12-15 NOTE — Progress Notes (Signed)
Monday, December 15, 2011  BP: 118/84         P: 65            T: 96.8  Katie [name] presents for adjustment of recently inserted upper and lower complete dentures.  Subjective:  Patient is complaining of irritation to the lower left and lower right lingual areas.  Examination:  There is some erythema in these areas. No denture ulcerations are noted.  Procedure: Pressure indicating paste was applied to upper and lower complete dentures. Adjustments made as needed.  Thick pressure indicating paste applied to denture borders. Adjustments made as needed. [place]. Occlusion was checked and adjustments made as needed.  Patient still protruding slightly. Patient instructed on how to find centric relation position. Patient accepts results. The patient is to keep dentures out if sore spots develop. Use salt water rinses as needed to aid healing. RTC as scheduled for denture adjustment. Call if problems arise before then. The patient was dismissed in stable condition.  Dr. Cindra Eves

## 2011-12-30 ENCOUNTER — Encounter (HOSPITAL_COMMUNITY): Payer: Self-pay | Admitting: Dentistry

## 2012-01-05 ENCOUNTER — Ambulatory Visit (HOSPITAL_COMMUNITY): Payer: Self-pay | Admitting: Dentistry

## 2012-01-05 VITALS — BP 151/90 | HR 83 | Temp 97.3°F

## 2012-01-05 DIAGNOSIS — K1370 Unspecified lesions of oral mucosa: Secondary | ICD-10-CM

## 2012-01-05 DIAGNOSIS — Z463 Encounter for fitting and adjustment of dental prosthetic device: Secondary | ICD-10-CM

## 2012-01-05 DIAGNOSIS — K062 Gingival and edentulous alveolar ridge lesions associated with trauma: Secondary | ICD-10-CM

## 2012-01-05 NOTE — Progress Notes (Signed)
Monday, January 05, 2012   BP: 151/90          P:  83            T: 97.3  Katie [name] presents for adjustment of recently inserted upper and lower complete dentures. Subjective:  Patient is complaining of irritation to the lower left and lower right lingual areas. Examination:  There is some erythema in these areas. No denture ulcerations are noted. Procedure: Pressure indicating paste was applied to upper and lower complete dentures. Adjustments made as needed.  Thick pressure indicating paste applied to denture borders. Adjustments made as needed. [place]. Occlusion was checked and adjustments made as needed.  Patient appears to be chewing in the correct centric relation position. Patient accepts results. The patient is to keep dentures out if sore spots develop. Use salt water rinses as needed to aid healing. RTC as scheduled for denture adjustment. Call if problems arise before then. The patient was dismissed in stable condition.   Dr. Cindra Eves

## 2012-02-03 ENCOUNTER — Ambulatory Visit (HOSPITAL_COMMUNITY): Payer: Self-pay | Admitting: Dentistry

## 2012-02-03 VITALS — BP 118/75 | HR 76 | Temp 97.7°F

## 2012-02-03 DIAGNOSIS — K1370 Unspecified lesions of oral mucosa: Secondary | ICD-10-CM

## 2012-02-03 DIAGNOSIS — Z463 Encounter for fitting and adjustment of dental prosthetic device: Secondary | ICD-10-CM

## 2012-02-03 DIAGNOSIS — K062 Gingival and edentulous alveolar ridge lesions associated with trauma: Secondary | ICD-10-CM

## 2012-02-03 NOTE — Progress Notes (Signed)
Tuesday, February 03, 2012   BP: 118/75                 P: 76                T: 97.7  Sherre Lain presents for adjustment of recently inserted upper and lower complete dentures. Subjective:  Patient is not complaining of denture irritation but points to sharp ara of Upper right premolar tooth areas. Examination:  No denture ulcerations or erythema noted.  Procedure: Pressure indicating paste was applied to upper and lower complete dentures. Adjustments made as needed.  Occlusion was checked and adjustments made as needed.  Maxillary denture teeth were evaluated for sharp edges and polished. Patient appears to be chewing in the correct centric relation position. Patient accepts results. The patient is to keep dentures out if sore spots develop. Use salt water rinses as needed to aid healing. RTC as scheduled for denture adjustment. Call if problems arise before then. The patient was dismissed in stable condition. Dr. Cindra Eves

## 2012-05-04 ENCOUNTER — Encounter (HOSPITAL_COMMUNITY): Payer: Self-pay | Admitting: Dentistry

## 2012-05-19 ENCOUNTER — Encounter (HOSPITAL_COMMUNITY): Payer: Self-pay | Admitting: Dentistry

## 2012-05-31 ENCOUNTER — Ambulatory Visit: Payer: Self-pay | Admitting: Thoracic Surgery (Cardiothoracic Vascular Surgery)

## 2012-07-05 ENCOUNTER — Ambulatory Visit: Payer: Self-pay | Admitting: Thoracic Surgery (Cardiothoracic Vascular Surgery)

## 2013-07-12 ENCOUNTER — Emergency Department (HOSPITAL_COMMUNITY)
Admission: EM | Admit: 2013-07-12 | Discharge: 2013-07-12 | Disposition: A | Discharge status: Home / Self Care | Payer: Medicare Other | Attending: Emergency Medicine | Admitting: Emergency Medicine

## 2013-07-12 ENCOUNTER — Emergency Department (HOSPITAL_COMMUNITY): Payer: Medicare Other

## 2013-07-12 ENCOUNTER — Encounter (HOSPITAL_COMMUNITY): Payer: Self-pay | Admitting: Emergency Medicine

## 2013-07-12 DIAGNOSIS — Z8639 Personal history of other endocrine, nutritional and metabolic disease: Secondary | ICD-10-CM | POA: Insufficient documentation

## 2013-07-12 DIAGNOSIS — R011 Cardiac murmur, unspecified: Secondary | ICD-10-CM | POA: Insufficient documentation

## 2013-07-12 DIAGNOSIS — Z79899 Other long term (current) drug therapy: Secondary | ICD-10-CM | POA: Diagnosis not present

## 2013-07-12 DIAGNOSIS — R0789 Other chest pain: Secondary | ICD-10-CM | POA: Diagnosis not present

## 2013-07-12 DIAGNOSIS — Z87891 Personal history of nicotine dependence: Secondary | ICD-10-CM | POA: Insufficient documentation

## 2013-07-12 DIAGNOSIS — Z85528 Personal history of other malignant neoplasm of kidney: Secondary | ICD-10-CM | POA: Diagnosis not present

## 2013-07-12 DIAGNOSIS — Z8719 Personal history of other diseases of the digestive system: Secondary | ICD-10-CM | POA: Insufficient documentation

## 2013-07-12 DIAGNOSIS — Z8701 Personal history of pneumonia (recurrent): Secondary | ICD-10-CM | POA: Diagnosis not present

## 2013-07-12 DIAGNOSIS — I10 Essential (primary) hypertension: Secondary | ICD-10-CM | POA: Insufficient documentation

## 2013-07-12 DIAGNOSIS — R5381 Other malaise: Secondary | ICD-10-CM | POA: Insufficient documentation

## 2013-07-12 DIAGNOSIS — Z862 Personal history of diseases of the blood and blood-forming organs and certain disorders involving the immune mechanism: Secondary | ICD-10-CM | POA: Insufficient documentation

## 2013-07-12 DIAGNOSIS — R0602 Shortness of breath: Secondary | ICD-10-CM | POA: Diagnosis not present

## 2013-07-12 DIAGNOSIS — J40 Bronchitis, not specified as acute or chronic: Secondary | ICD-10-CM

## 2013-07-12 DIAGNOSIS — M129 Arthropathy, unspecified: Secondary | ICD-10-CM | POA: Diagnosis not present

## 2013-07-12 DIAGNOSIS — J441 Chronic obstructive pulmonary disease with (acute) exacerbation: Secondary | ICD-10-CM | POA: Insufficient documentation

## 2013-07-12 LAB — BASIC METABOLIC PANEL
BUN: 19 mg/dL (ref 6–23)
Calcium: 9.8 mg/dL (ref 8.4–10.5)
Chloride: 103 mEq/L (ref 96–112)
Creatinine, Ser: 1.33 mg/dL — ABNORMAL HIGH (ref 0.50–1.10)
GFR calc Af Amer: 47 mL/min — ABNORMAL LOW (ref >90)
GFR calc non Af Amer: 41 mL/min — ABNORMAL LOW (ref >90)
Potassium: 4.1 mEq/L (ref 3.5–5.1)

## 2013-07-12 LAB — CBC
HCT: 36.8 % (ref 36.0–46.0)
MCH: 29.9 pg (ref 26.0–34.0)
MCV: 89.5 fL (ref 78.0–100.0)
Platelets: 271 10*3/uL (ref 150–400)
RDW: 12.8 % (ref 11.5–15.5)
WBC: 5.4 10*3/uL (ref 4.0–10.5)

## 2013-07-12 LAB — POCT I-STAT TROPONIN I: Troponin i, poc: 0 ng/mL (ref 0.00–0.08)

## 2013-07-12 MED ORDER — DEXAMETHASONE SODIUM PHOSPHATE 10 MG/ML IJ SOLN
10.0000 mg | Freq: Once | INTRAMUSCULAR | Status: AC
  Administered 2013-07-12: 10 mg via INTRAMUSCULAR
  Filled 2013-07-12: qty 1

## 2013-07-12 MED ORDER — PREDNISONE 10 MG PO TABS: ORAL_TABLET | ORAL | Status: DC

## 2013-07-12 MED ORDER — ALBUTEROL SULFATE (5 MG/ML) 0.5% IN NEBU
5.0000 mg | INHALATION_SOLUTION | Freq: Once | RESPIRATORY_TRACT | Status: AC
  Administered 2013-07-12: 5 mg via RESPIRATORY_TRACT
  Filled 2013-07-12: qty 1

## 2013-07-12 MED ORDER — IPRATROPIUM BROMIDE 0.02 % IN SOLN
0.5000 mg | Freq: Once | RESPIRATORY_TRACT | Status: AC
  Administered 2013-07-12: 0.5 mg via RESPIRATORY_TRACT
  Filled 2013-07-12: qty 2.5

## 2013-07-12 MED ORDER — ALBUTEROL SULFATE HFA 108 (90 BASE) MCG/ACT IN AERS: 2.0000 | INHALATION_SPRAY | RESPIRATORY_TRACT | Status: DC | PRN

## 2013-07-12 MED ORDER — AZITHROMYCIN 250 MG PO TABS: 250.0000 mg | ORAL_TABLET | Freq: Every day | ORAL | Status: DC

## 2013-07-12 MED ORDER — ALBUTEROL SULFATE HFA 108 (90 BASE) MCG/ACT IN AERS: 2.0000 | INHALATION_SPRAY | Freq: Once | RESPIRATORY_TRACT | Status: DC

## 2013-07-12 NOTE — ED Provider Notes (Signed)
Katie [name]   8:00PM Pt discussed in sign out.  Pt presenting with SOB and wheezing.  Labs and CXR unremarkable.  Symptoms consistent with COPD exacerbation.  Pt has hx of same.  Steroids given.  Breathing tx'[name] ordered.  Plan to recheck.  Pt likely able to be d/c home.  9:30PM Pt doing much better.  She is ready to go home.  Rx and resource guide provided.  Angus Seller, PA-C 07/12/13 [id number]

## 2013-07-12 NOTE — ED Notes (Signed)
Patient reports feeling much better. VSS. Resp e/u. NAD noted. No wheezing auscultated. Breath sounds clear and equal bilaterally. No SOB or CP. Discharge instructions, information and prescriptions reviewed. Patient verbalized understanding.

## 2013-07-12 NOTE — ED Notes (Signed)
Pt c/o increased SOB x weeks; pt sts hx of untreated COPD and now smoking again

## 2013-07-12 NOTE — ED Provider Notes (Signed)
CSN: [id number]     Arrival date & time 07/12/13  1601 History   First MD Initiated Contact with Patient 07/12/13 1905     Chief Complaint  Patient presents with  . Shortness of Breath   (Consider location/radiation/quality/duration/timing/severity/associated sxs/prior Treatment) HPI Katie [name] is a 67 y.o. female who presents to emergency department with complaint of shortness of breath. Patient states that she has history of COPD and she is a smoker. Patient stated that she began having increased cough and shortness of breath about a week ago. States symptoms began to worsen over last several days. Patient states that she thought it was related to her smoking and she stopped smoking 3 days ago. Patient states that her breathing did not improve. Patient states that she has shortness of breath and persistent cough both day and night. Patient states she's not getting good sleep at night and has excessive sleepiness during the day. Patient states that she feels like she has to keep taking deep breath to get some air. Patient states that her chest feels tight. Patient denies any chest pain. Patient states that her shortness of breath is not worsened with exertion. She has history of pneumonia states wanted to make sure she doesn't have it again. Patient denies any fever, chills, malaise. Patient states that she is been using her albuterol inhaler, states uses it once daily. States he currently does not have a primary care doctor. Denies lower extremity swelling. Denies recent travel or surgeries.   Past Medical History  Diagnosis Date  . Arthritis   . COPD (chronic obstructive pulmonary disease)     no history of PFTs, only diagnosed by CXR, not on any inhalers because she has not had a PCP in over 2 years  . Hypertension     previously on Lisinopril/HCTZ  . History of heartburn   . Hypercholesteremia     previously on pravastatin   . Cholelithiasis 2011    s/p cholescystectomy   . Renal  cell carcinoma 01/2010    s/p right radical nephrectomy 01/2010,  followed by [facility]  . Murmur     no  prior work-up  . GERD (gastroesophageal reflux disease)   . Emphysema 2010    dx'd/CXR  . Shortness of breath 09/26/11    "lately all the time"  . Pneumonia 1980's    "walking"   Past Surgical History  Procedure Laterality Date  . Cholecystectomy  01/2010  . Nephrectomy radical  01/2010    right   . Tubal ligation  1970's  . Diagnostic laparoscopy    . Ovarian cyst removal  1970's    "went through belly button"  . Appendectomy  1970's  . Tee without cardioversion  10/01/2011    Procedure: TRANSESOPHAGEAL ECHOCARDIOGRAM (TEE);  Surgeon: Corky Crafts;  Location: [facility] ENDOSCOPY;  Service: Cardiovascular;  Laterality: N/A;  . US echocardiography  09/2011  . Cardiac catheterization  09/2011  . Multiple extractions with alveoloplasty  10/06/2011    Procedure: MULTIPLE EXTRACION WITH ALVEOLOPLASTY;  Surgeon: Charlynne Pander, DDS;  Location: [facility] OR;  Service: Oral Surgery;  Laterality: N/A;  Multiple extraction of tooth #'s 1, 6, 8, 10, 11, 22, 23, 26, 27, 28, 29 with alveoloplasty and Upper right buccal exostoses reductions.   Family History  Problem Relation Age of Onset  . COPD Mother     DECEASED   History  Substance Use Topics  . Smoking status: Former Smoker -- 1.00 packs/day for 50 years  Types: Cigarettes    Quit date: 09/25/2011  . Smokeless tobacco: Never Used     Comment: smoking cessation consult entered  . Alcohol Use: Yes     Comment:  drink very rarely"   OB History   Grav Para Term Preterm Abortions TAB SAB Ect Mult Living                 Review of Systems  Constitutional: Positive for fatigue. Negative for fever, chills and diaphoresis.  HENT: Negative for neck pain and neck stiffness.   Respiratory: Positive for cough, chest tightness, shortness of breath and wheezing.   Cardiovascular: Negative for chest pain, palpitations and leg  swelling.  Gastrointestinal: Negative for nausea, vomiting and abdominal pain.  Neurological: Negative for dizziness, weakness, light-headedness and headaches.  All other systems reviewed and are negative.    Allergies  Review of patient's allergies indicates no known allergies.  Home Medications   Current Outpatient Rx  Name  Route  Sig  Dispense  Refill  . albuterol (PROVENTIL HFA;VENTOLIN HFA) 108 (90 BASE) MCG/ACT inhaler   Inhalation   Inhale 1 puff into the lungs every 6 (six) hours as needed for wheezing.         Marland Kitchen ibuprofen (ADVIL,MOTRIN) 200 MG tablet   Oral   Take 400 mg by mouth every 6 (six) hours as needed. For pain           . EXPIRED: zolpidem (AMBIEN) 5 MG tablet   Oral   Take 1 tablet (5 mg total) by mouth at bedtime as needed for sleep.   30 tablet   0    BP 160/104  Pulse 100  Temp(Src) 98 F (36.7 C) (Oral)  Resp 18  SpO2 99% Physical Exam  Nursing note and vitals reviewed. Constitutional: She appears well-developed and well-nourished. No distress.  HENT:  Head: Normocephalic.  Eyes: Conjunctivae are normal.  Neck: Neck supple.  Cardiovascular: Normal rate, regular rhythm and normal heart sounds.   Pulmonary/Chest: Effort normal. No respiratory distress. She has wheezes. She has no rales.  Inspiratory and expiratory wheezes in all lung field.   Abdominal: Soft. Bowel sounds are normal. She exhibits no distension. There is no tenderness. There is no rebound.  Musculoskeletal: She exhibits no edema.  Neurological: She is alert.  Skin: Skin is warm and dry.  Psychiatric: She has a normal mood and affect. Her behavior is normal.    ED Course  Procedures (including critical care time) Labs Review Labs Reviewed  BASIC METABOLIC PANEL - Abnormal; Notable for the following:    Glucose, Bld 147 (*)    Creatinine, Ser 1.33 (*)    GFR calc non Af Amer 41 (*)    GFR calc Af Amer 47 (*)    All other components within normal limits  CBC  POCT  I-STAT TROPONIN I   Imaging Review Dg Chest 2 View  07/12/2013   CLINICAL DATA:  Smoker with shortness of breath. History of COPD and hypertension.  EXAM: CHEST  2 VIEW  COMPARISON:  11/24/2011 and 09/30/2011.  FINDINGS: The heart size and mediastinal contours are stable. There is stable biapical pleural parenchymal scarring. No confluent airspace opacity or pleural effusion is identified. The osseous structures appear unchanged. Multiple surgical clips are present within the right upper abdomen, unchanged.  IMPRESSION: No active cardiopulmonary process.   Electronically Signed   By: Roxy Horseman   On: 07/12/2013 16:59    Date: 07/12/2013  Rate: 85  Rhythm:  normal sinus rhythm  QRS Axis: normal  Intervals: normal  ST/T Wave abnormalities: normal  Conduction Disutrbances:none  Narrative Interpretation:   Old EKG Reviewed: none available    MDM   1. COPD exacerbation   2. Bronchitis     Patient with history of COPD here with worsening shortness of breath. She stopped smoking 3 days ago. Her lab work and chest x-ray normal. Her vital signs are normal with oxygen titration will 100% on room air. Her blood pressure initially elevated but rechecked and is normal. She was treated by me in emergency department with neck treatments and Decadron 10 mg IM. She has improved after first treatment. Will give 2 additional treatments. Her EKG is troponin are normal.  Patient signed out with PA Dammen at shift change. Plan to continue nebs and discharged home with prednisone, Zithromax, inhaler, and follow up with her primary care Dr.  Ceasar Mons Vitals:   07/12/13 1612 07/12/13 1933 07/12/13 2015  BP: 160/104 136/90   Pulse: 100 81   Temp: 98 F (36.7 C)    TempSrc: Oral    Resp: 18 18   SpO2: 99% 100% 100%      Myriam Jacobson Tajuanna Burnett, PA-C 07/12/13 2023

## 2013-07-13 NOTE — ED Provider Notes (Signed)
Medical screening examination/treatment/procedure(s) were performed by non-physician practitioner and as supervising physician I was immediately available for consultation/collaboration.  Mikhaela Zaugg, MD 07/13/13 0237 

## 2013-07-14 ENCOUNTER — Encounter: Payer: Self-pay | Admitting: Physician Assistant

## 2013-07-14 ENCOUNTER — Ambulatory Visit (INDEPENDENT_AMBULATORY_CARE_PROVIDER_SITE_OTHER): Payer: Medicare Other | Admitting: Physician Assistant

## 2013-07-14 VITALS — BP 136/88 | HR 93 | Temp 98.4°F | Resp 16 | Ht 61.0 in | Wt 151.2 lb

## 2013-07-14 DIAGNOSIS — Z0000 Encounter for general adult medical examination without abnormal findings: Secondary | ICD-10-CM

## 2013-07-14 DIAGNOSIS — J209 Acute bronchitis, unspecified: Secondary | ICD-10-CM

## 2013-07-14 DIAGNOSIS — J449 Chronic obstructive pulmonary disease, unspecified: Secondary | ICD-10-CM | POA: Diagnosis not present

## 2013-07-14 DIAGNOSIS — G4700 Insomnia, unspecified: Secondary | ICD-10-CM | POA: Diagnosis not present

## 2013-07-14 DIAGNOSIS — I10 Essential (primary) hypertension: Secondary | ICD-10-CM

## 2013-07-14 DIAGNOSIS — J440 Chronic obstructive pulmonary disease with acute lower respiratory infection: Secondary | ICD-10-CM

## 2013-07-14 DIAGNOSIS — Z23 Encounter for immunization: Secondary | ICD-10-CM

## 2013-07-14 DIAGNOSIS — E785 Hyperlipidemia, unspecified: Secondary | ICD-10-CM

## 2013-07-14 MED ORDER — ZOLPIDEM TARTRATE 5 MG PO TABS: 5.0000 mg | ORAL_TABLET | Freq: Every evening | ORAL | Status: DC | PRN

## 2013-07-14 MED ORDER — IPRATROPIUM-ALBUTEROL 0.5-2.5 (3) MG/3ML IN SOLN
3.0000 mL | Freq: Once | RESPIRATORY_TRACT | Status: AC
  Administered 2013-07-14: 3 mL via RESPIRATORY_TRACT

## 2013-07-14 MED ORDER — FLUTICASONE-SALMETEROL 250-50 MCG/DOSE IN AEPB: 1.0000 | INHALATION_SPRAY | Freq: Two times a day (BID) | RESPIRATORY_TRACT | Status: DC

## 2013-07-14 NOTE — Patient Instructions (Signed)
Please obtain labs.  I will call you with the results and we will treat you accordingly.  Please take medications as prescribed. I want to see you in 1 month for BP recheck and follow-up.  You should also schedule gynecological exam with PAP with myself or one of the female providers.  Please read information below on DASH diet as well as smoking cessation tips.  DASH Diet The DASH diet stands for "Dietary Approaches to Stop Hypertension." It is a healthy eating plan that has been shown to reduce high blood pressure (hypertension) in as little as 14 days, while also possibly providing other significant health benefits. These other health benefits include reducing the risk of breast cancer after menopause and reducing the risk of type 2 diabetes, heart disease, colon cancer, and stroke. Health benefits also include weight loss and slowing kidney failure in patients with chronic kidney disease.  DIET GUIDELINES  Limit salt (sodium). Your diet should contain less than 1500 mg of sodium daily.  Limit refined or processed carbohydrates. Your diet should include mostly whole grains. Desserts and added sugars should be used sparingly.  Include small amounts of heart-healthy fats. These types of fats include nuts, oils, and tub margarine. Limit saturated and trans fats. These fats have been shown to be harmful in the body. CHOOSING FOODS  The following food groups are based on a 2000 calorie diet. See your Registered Dietitian for individual calorie needs. Grains and Grain Products (6 to 8 servings daily)  Eat More Often: Whole-wheat bread, brown rice, whole-grain or wheat pasta, quinoa, popcorn without added fat or salt (air popped).  Eat Less Often: White bread, white pasta, white rice, cornbread. Vegetables (4 to 5 servings daily)  Eat More Often: Fresh, frozen, and canned vegetables. Vegetables may be raw, steamed, roasted, or grilled with a minimal amount of fat.  Eat Less Often/Avoid: Creamed or  fried vegetables. Vegetables in a cheese sauce. Fruit (4 to 5 servings daily)  Eat More Often: All fresh, canned (in natural juice), or frozen fruits. Dried fruits without added sugar. One hundred percent fruit juice ( cup [237 mL] daily).  Eat Less Often: Dried fruits with added sugar. Canned fruit in light or heavy syrup. [employer], Fish, and Poultry (2 servings or less daily. One serving is 3 to 4 oz [85-114 g]).  Eat More Often: Ninety percent or leaner ground beef, tenderloin, sirloin. Round cuts of beef, chicken breast, [place] breast. All fish. Grill, bake, or broil your meat. Nothing should be fried.  Eat Less Often/Avoid: Fatty cuts of meat, [place], or chicken leg, thigh, or wing. Fried cuts of meat or fish. Dairy (2 to 3 servings)  Eat More Often: Low-fat or fat-free milk, low-fat plain or light yogurt, reduced-fat or part-skim cheese.  Eat Less Often/Avoid: Milk (whole, 2%).Whole milk yogurt. Full-fat cheeses. Nuts, Seeds, and Legumes (4 to 5 servings per week)  Eat More Often: All without added salt.  Eat Less Often/Avoid: Salted nuts and seeds, canned beans with added salt. Fats and Sweets (limited)  Eat More Often: Vegetable oils, tub margarines without trans fats, sugar-free gelatin. Mayonnaise and salad dressings.  Eat Less Often/Avoid: Coconut oils, palm oils, butter, stick margarine, cream, half and half, cookies, candy, pie. FOR MORE INFORMATION The Dash Diet Eating Plan: www.dashdiet.org Document Released: 09/25/2011 Document Revised: 12/29/2011 Document Reviewed: 09/25/2011 [facility] Patient Information 2014 [place], [place].   Smoking Cessation, Tips for Success YOU CAN QUIT SMOKING If you are ready to quit smoking, congratulations! You  have chosen to help yourself be healthier. Cigarettes bring nicotine, tar, carbon monoxide, and other irritants into your body. Your lungs, heart, and blood vessels will be able to work better without these poisons. There are  many different ways to quit smoking. Nicotine gum, nicotine patches, a nicotine inhaler, or nicotine nasal spray can help with physical craving. Hypnosis, support groups, and medicines help break the habit of smoking. Here are some tips to help you quit for good.  Throw away all cigarettes.  Clean and remove all ashtrays from your home, work, and car.  On a card, write down your reasons for quitting. Carry the card with you and read it when you get the urge to smoke.  Cleanse your body of nicotine. Drink enough water and fluids to keep your urine clear or pale yellow. Do this after quitting to flush the nicotine from your body.  Learn to predict your moods. Do not let a bad situation be your excuse to have a cigarette. Some situations in your life might tempt you into wanting a cigarette.  Never have "just one" cigarette. It leads to wanting another and another. Remind yourself of your decision to quit.  Change habits associated with smoking. If you smoked while driving or when feeling stressed, try other activities to replace smoking. Stand up when drinking your coffee. Brush your teeth after eating. Sit in a different chair when you read the paper. Avoid alcohol while trying to quit, and try to drink fewer caffeinated beverages. Alcohol and caffeine may urge you to smoke.  Avoid foods and drinks that can trigger a desire to smoke, such as sugary or spicy foods and alcohol.  Ask people who smoke not to smoke around you.  Have something planned to do right after eating or having a cup of coffee. Take a walk or exercise to perk you up. This will help to keep you from overeating.  Try a relaxation exercise to calm you down and decrease your stress. Remember, you may be tense and nervous for the first 2 weeks after you quit, but this will pass.  Find new activities to keep your hands busy. Play with a pen, coin, or rubber band. Doodle or draw things on paper.  Brush your teeth right after  eating. This will help cut down on the craving for the taste of tobacco after meals. You can try mouthwash, too.  Use oral substitutes, such as lemon drops, carrots, a cinnamon stick, or chewing gum, in place of cigarettes. Keep them handy so they are available when you have the urge to smoke.  When you have the urge to smoke, try deep breathing.  Designate your home as a nonsmoking area.  If you are a heavy smoker, ask your caregiver about a prescription for nicotine chewing gum. It can ease your withdrawal from nicotine.  Reward yourself. Set aside the cigarette money you save and buy yourself something nice.  Look for support from others. Join a support group or smoking cessation program. Ask someone at home or at work to help you with your plan to quit smoking.  Always ask yourself, "Do I need this cigarette or is this just a reflex?" Tell yourself, "Today, I choose not to smoke," or "I do not want to smoke." You are reminding yourself of your decision to quit, even if you do smoke a cigarette. HOW WILL I FEEL WHEN I QUIT SMOKING?  The benefits of not smoking start within days of quitting.  You may  have symptoms of withdrawal because your body is used to nicotine (the addictive substance in cigarettes). You may crave cigarettes, be irritable, feel very hungry, cough often, get headaches, or have difficulty concentrating.  The withdrawal symptoms are only temporary. They are strongest when you first quit but will go away within 10 to 14 days.  When withdrawal symptoms occur, stay in control. Think about your reasons for quitting. Remind yourself that these are signs that your body is healing and getting used to being without cigarettes.  Remember that withdrawal symptoms are easier to treat than the major diseases that smoking can cause.  Even after the withdrawal is over, expect periodic urges to smoke. However, these cravings are generally short-lived and will go away whether you  smoke or not. Do not smoke!  If you relapse and smoke again, do not lose hope. Most smokers quit 3 times before they are successful.  If you relapse, do not give up! Plan ahead and think about what you will do the next time you get the urge to smoke. LIFE AS A NONSMOKER: MAKE IT FOR A MONTH, MAKE IT FOR LIFE Day 1: Hang this page where you will see it every day. Day 2: Get rid of all ashtrays, matches, and lighters. Day 3: Drink water. Breathe deeply between sips. Day 4: Avoid places with smoke-filled air, such as bars, clubs, or the smoking section of restaurants. Day 5: Keep track of how much money you save by not smoking. Day 6: Avoid boredom. Keep a good book with you or go to the movies. Day 7: Reward yourself! One week without smoking! Day 8: Make a dental appointment to get your teeth cleaned. Day 9: Decide how you will turn down a cigarette before it is offered to you. Day 10: Review your reasons for quitting. Day 11: Distract yourself. Stay active to keep your mind off smoking and to relieve tension. Take a walk, exercise, read a book, do a crossword puzzle, or try a new hobby. Day 12: Exercise. Get off the bus before your stop or use stairs instead of escalators. Day 13: Call on friends for support and encouragement. Day 14: Reward yourself! Two weeks without smoking! Day 15: Practice deep breathing exercises. Day 16: Bet a friend that you can stay a nonsmoker. Day 17: Ask to sit in nonsmoking sections of restaurants. Day 18: Hang up "No Smoking" signs. Day 19: Think of yourself as a nonsmoker. Day 20: Each morning, tell yourself you will not smoke. Day 21: Reward yourself! Three weeks without smoking! Day 22: Think of smoking in negative ways. Remember how it stains your teeth, gives you bad breath, and leaves you short of breath. Day 23: Eat a nutritious breakfast. Day 24:Do not relive your days as a smoker. Day 25: Hold a pencil in your hand when talking on the  telephone. Day 26: Tell all your friends you do not smoke. Day 27: Think about how much better food tastes. Day 28: Remember, one cigarette is one too many. Day 29: Take up a hobby that will keep your hands busy. Day 30: Congratulations! One month without smoking! Give yourself a big reward. Your caregiver can direct you to community resources or hospitals for support, which may include:  Group support.  Education.  Hypnosis.  Subliminal therapy. Document Released: 07/04/2004 Document Revised: 12/29/2011 Document Reviewed: 07/23/2009 [facility] Patient Information 2014 [place], [place].

## 2013-07-14 NOTE — Progress Notes (Signed)
Patient ID: Katie [name], female   DOB: 03-08-1946, 67 y.o.   MRN: [id number]  Patient presents to clinic today to establish care.  COPD:    -- Patient endorses history of COPD.  Has 50 pack-year smoking history.  States COPD was diagnosed a few years ago when she was in the hospital.  Endorses having full pulmonary testing done in the past, but cannot remember where she had that done.  Has not seen PCP in numerous years.  Endorses shortness of breath and wheezing.  Had recent trip to ER for symptoms because she started back smoking for 1 week.  Realizes smoking is not an option for her.  Has successfully stopped smoking in the past.  No cigarette use since last ER visit.  Patient has albuterol and old Rx for Advair 250-50.  Is currently out of Advair due to finances.  She just recently got set up for medicare.  Is finishing prescription for azithromycin for bronchitis diagnosed while in ER.  Hypertension:      -- patient endorses occasional headache and heart palpitations.  EKG in ER WNL.  No history of stroke or heart attack.  Denies syncope, nausea, or vomiting. Check BP home -- with blood pressure usually at most140/88.  States her HTN has been well-controlled with diet and exercise.  GERD:       Occasional reflux.  Takes a Scientist, research (medical) with decent relief.    Hyperlipidemia:       -- Patient endorses hx of hyperlipidemia which is consistent with prior labs performed in ER/UC settings. Not currently on any medication.  Is overdue for lipid panel.  Insomnia:      -- Reports is well controlled with Ambien.  She states she only takes medication occasionally.  Health Maintenance: Dental -- edentulous. Patient has not seen dentist in years. Vision -- overdue.  Patient counseled on importance of yearly eye examinations Immunizations -- Patient states immunizations are up-to-date.  Is requesting flu vaccination Mammogram -- Last mammogram 2012.  No abnormal findings.  Patient is due for mammogram, but  declines to                               let me set up a screening mammogram for her. PAP -- last PAP smear over 10 years ago.  Patient encouraged due to current guidelines if she has no history of                        abnormal PAP and she has had at least 1 PAP after age 70, she can stop getting them at age 43.    Past Medical History  Diagnosis Date  . Arthritis   . COPD (chronic obstructive pulmonary disease)     no history of PFTs, only diagnosed by CXR, not on any inhalers because she has not had a PCP in over 2 years  . Hypertension     previously on Lisinopril/HCTZ  . History of heartburn   . Hypercholesteremia     previously on pravastatin   . Cholelithiasis 2011    s/p cholescystectomy   . Renal cell carcinoma 01/2010    s/p right radical nephrectomy 01/2010,  followed by [facility]  . Murmur     no  prior work-up  . GERD (gastroesophageal reflux disease)   . Emphysema 2010    dx'd/CXR  . Shortness of breath 09/26/11    "  lately all the time"  . Pneumonia 1980's    "walking"    Current Outpatient Prescriptions on File Prior to Visit  Medication Sig Dispense Refill  . albuterol (PROVENTIL HFA;VENTOLIN HFA) 108 (90 BASE) MCG/ACT inhaler Inhale 2 puffs into the lungs every 4 (four) hours as needed for wheezing.  1 Inhaler  0  . azithromycin (ZITHROMAX) 250 MG tablet Take 1 tablet (250 mg total) by mouth daily. Take first 2 tablets together, then 1 every day until finished.  6 tablet  0  . ibuprofen (ADVIL,MOTRIN) 200 MG tablet Take 400 mg by mouth every 6 (six) hours as needed. For pain        . predniSONE (DELTASONE) 10 MG tablet Take 5 tab day 1, take 4 tab day 2, take 3 tab day 3, take 2 tab day 4, and take 1 tab day 5  15 tablet  0   No current facility-administered medications on file prior to visit.    No Known Allergies  Family History  Problem Relation Age of Onset  . COPD Mother     DECEASED    History   Social History  . Marital Status:  Married    Spouse Name: N/A    Number of Children: 3  . Years of Education: 12   Occupational History  . Futures trader business   Social History Main Topics  . Smoking status: Current Every Day Smoker -- 1.00 packs/day for 50 years    Types: Cigarettes    Last Attempt to Quit: 09/25/2011  . Smokeless tobacco: Never Used     Comment: smoking cessation consult entered  . Alcohol Use: Yes     Comment:  drink very rarely"  . Drug Use: No     Comment: hx of cocaine last in 2006  . Sexual Activity: No   Other Topics Concern  . None   Social History Narrative   Lives in [place] with her husband.    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    Patient has 3 grown children that do not live with her.    Patient does not have any medical insurance.   Review of Systems  Constitutional: Negative for fever, chills and weight loss.  HENT: Positive for hearing loss. Negative for ear pain, neck pain, tinnitus and ear discharge.   Eyes: Positive for blurred vision. Negative for double vision, photophobia and pain.  Respiratory: Positive for cough, shortness of breath and wheezing.   Cardiovascular: Positive for palpitations. Negative for chest pain.  Gastrointestinal: Positive for heartburn. Negative for nausea, vomiting, abdominal pain, diarrhea, constipation, blood in stool and melena.  Genitourinary: Negative for dysuria, urgency, frequency, hematuria and flank pain.  Musculoskeletal: Negative for myalgias and back pain.  Neurological: Negative for dizziness, seizures, loss of consciousness and headaches.  Endo/Heme/Allergies: Negative for environmental allergies. Does not bruise/bleed easily.  Psychiatric/Behavioral: Negative for depression. The patient has insomnia. The patient is not nervous/anxious.    Filed Vitals:   07/14/13 1335  BP: 136/88  Pulse: 93  Temp: 98.4 F (36.9 C)  Resp: 16   Physical Exam  Vitals  reviewed. Constitutional: She is oriented to person, place, and time and well-developed, well-nourished, and in no distress.  HENT:  Head: Normocephalic and atraumatic.  Right Ear: External ear normal.  Left Ear: External ear normal.  Nose: Nose normal.  Mouth/Throat: Oropharynx is clear and moist. No oropharyngeal exudate.  TM WNL bilaterally  Eyes: Conjunctivae and EOM are normal. Pupils are equal, round, and reactive to light.  Neck: Normal range of motion. Neck supple. No thyromegaly present.  Cardiovascular: Normal rate, regular rhythm, normal heart sounds and intact distal pulses.   Pulmonary/Chest: Effort normal. She has wheezes. She has no rales. She exhibits no tenderness.  Abdominal: Soft. Bowel sounds are normal. She exhibits no distension and no mass. There is no tenderness. There is no rebound and no guarding.  Lymphadenopathy:    She has no cervical adenopathy.  Neurological: She is alert and oriented to person, place, and time.  Skin: Skin is warm and dry. No rash noted.   Recent Results (from the past 2160 hour(s))  CBC     Status: None   Collection Time    07/12/13  4:14 PM      Result Value Range   WBC 5.4  4.0 - 10.5 K/uL   RBC 4.11  3.87 - 5.11 MIL/uL   Hemoglobin 12.3  12.0 - 15.0 g/dL   HCT 21.3  08.6 - 57.8 %   MCV 89.5  78.0 - 100.0 fL   MCH 29.9  26.0 - 34.0 pg   MCHC 33.4  30.0 - 36.0 g/dL   RDW 46.9  62.9 - 52.8 %   Platelets 271  150 - 400 K/uL  BASIC METABOLIC PANEL     Status: Abnormal   Collection Time    07/12/13  4:14 PM      Result Value Range   Sodium 139  135 - 145 mEq/L   Potassium 4.1  3.5 - 5.1 mEq/L   Chloride 103  96 - 112 mEq/L   CO2 25  19 - 32 mEq/L   Glucose, Bld 147 (*) 70 - 99 mg/dL   BUN 19  6 - 23 mg/dL   Creatinine, Ser 4.13 (*) 0.50 - 1.10 mg/dL   Calcium 9.8  8.4 - 24.4 mg/dL   GFR calc non Af Amer 41 (*) >90 mL/min   GFR calc Af Amer 47 (*) >90 mL/min   Comment: (NOTE)     The eGFR has been calculated using the CKD  EPI equation.     This calculation has not been validated in all clinical situations.     eGFR's persistently <90 mL/min signify possible Chronic Kidney     Disease.  POCT I-STAT TROPONIN I     Status: None   Collection Time    07/12/13  4:32 PM      Result Value Range   Troponin i, poc 0.00  0.00 - 0.08 ng/mL   Comment 3            Comment: Due to the release kinetics of cTnI,     a negative result within the first hours     of the onset of symptoms does not rule out     myocardial infarction with certainty.     If myocardial infarction is still suspected,     repeat the test at appropriate intervals.    Assessment/Plan: COPD with acute bronchitis Nebulizer given with improvement in lung exam.  Patient to continue Azithromycin as prescribed by ED Physician.  Continue albuterol.  Samples of Advair given.  Rx refill for Advair.  Rest, fluids, saline nasal spray, humidifier.  HLD (hyperlipidemia) Will repeat lipid panel and restart therapy if necessary  HTN (hypertension) BP good in clinic.  Continue lifestyle changes.  Insomnia Refill ambien. Discussed sleep hygiene measures with patient.  Need for prophylactic  vaccination and inoculation against influenza Influenza vaccination given by nursing staff  Visit for preventive health examination Patient to return for fasting labs.  Refuses mammogram at present time.  Discussed importance of annually or biannually mammograms.  Is to return for gynecological exam with PAP.

## 2013-07-15 DIAGNOSIS — J209 Acute bronchitis, unspecified: Secondary | ICD-10-CM | POA: Insufficient documentation

## 2013-07-15 DIAGNOSIS — Z23 Encounter for immunization: Secondary | ICD-10-CM | POA: Insufficient documentation

## 2013-07-15 DIAGNOSIS — Z0000 Encounter for general adult medical examination without abnormal findings: Secondary | ICD-10-CM | POA: Insufficient documentation

## 2013-07-15 DIAGNOSIS — G4700 Insomnia, unspecified: Secondary | ICD-10-CM | POA: Insufficient documentation

## 2013-07-15 NOTE — Assessment & Plan Note (Signed)
Will repeat lipid panel and restart therapy if necessary

## 2013-07-15 NOTE — Assessment & Plan Note (Addendum)
Nebulizer given with improvement in lung exam.  Patient to continue Azithromycin as prescribed by ED Physician.  Continue albuterol.  Samples of Advair given.  Rx refill for Advair.  Rest, fluids, saline nasal spray, humidifier.

## 2013-07-15 NOTE — Assessment & Plan Note (Signed)
Influenza vaccination given by nursing staff

## 2013-07-15 NOTE — Assessment & Plan Note (Addendum)
Patient to return for fasting labs.  Refuses mammogram at present time.  Discussed importance of annually or biannually mammograms.  Is to return for gynecological exam with PAP.

## 2013-07-15 NOTE — Assessment & Plan Note (Signed)
Refill ambien. Discussed sleep hygiene measures with patient.

## 2013-07-15 NOTE — ED Provider Notes (Signed)
  Medical screening examination/treatment/procedure(s) were performed by non-physician practitioner and as supervising physician I was immediately available for consultation/collaboration.    Kross Swallows, MD 07/15/13 2319 

## 2013-07-15 NOTE — Assessment & Plan Note (Signed)
BP good in clinic.  Continue lifestyle changes.

## 2013-08-08 ENCOUNTER — Encounter: Payer: Self-pay | Admitting: Physician Assistant

## 2013-08-08 ENCOUNTER — Ambulatory Visit (INDEPENDENT_AMBULATORY_CARE_PROVIDER_SITE_OTHER): Payer: Medicare Other | Admitting: Physician Assistant

## 2013-08-08 ENCOUNTER — Other Ambulatory Visit: Payer: Self-pay

## 2013-08-08 VITALS — BP 124/90 | Temp 98.6°F | Wt 151.0 lb

## 2013-08-08 DIAGNOSIS — Z299 Encounter for prophylactic measures, unspecified: Secondary | ICD-10-CM | POA: Diagnosis not present

## 2013-08-08 DIAGNOSIS — I10 Essential (primary) hypertension: Secondary | ICD-10-CM

## 2013-08-08 DIAGNOSIS — K219 Gastro-esophageal reflux disease without esophagitis: Secondary | ICD-10-CM | POA: Diagnosis not present

## 2013-08-08 DIAGNOSIS — J449 Chronic obstructive pulmonary disease, unspecified: Secondary | ICD-10-CM

## 2013-08-08 LAB — CBC WITH DIFFERENTIAL/PLATELET
Basophils Absolute: 0.1 10*3/uL (ref 0.0–0.1)
Basophils Relative: 1 % (ref 0–1)
Eosinophils Absolute: 0.5 10*3/uL (ref 0.0–0.7)
Hemoglobin: 12.5 g/dL (ref 12.0–15.0)
Lymphocytes Relative: 31 % (ref 12–46)
MCHC: 34.2 g/dL (ref 30.0–36.0)
MCV: 86.5 fL (ref 78.0–100.0)
Neutro Abs: 3 10*3/uL (ref 1.7–7.7)
Neutrophils Relative %: 52 % (ref 43–77)
RDW: 13.8 % (ref 11.5–15.5)
WBC: 5.7 10*3/uL (ref 4.0–10.5)

## 2013-08-08 MED ORDER — ALBUTEROL SULFATE (5 MG/ML) 0.5% IN NEBU: 2.5000 mg | INHALATION_SOLUTION | Freq: Four times a day (QID) | RESPIRATORY_TRACT | Status: DC | PRN

## 2013-08-08 MED ORDER — ALBUTEROL SULFATE (2.5 MG/3ML) 0.083% IN NEBU: 2.5000 mg | INHALATION_SOLUTION | Freq: Four times a day (QID) | RESPIRATORY_TRACT | Status: DC | PRN

## 2013-08-08 NOTE — Telephone Encounter (Signed)
Received a fax from [facility] asking if the albuterol 0.5% nebulizer solution should be the premixed albuterol 0.083%. Changed and sent back to pharmacy.

## 2013-08-08 NOTE — Patient Instructions (Signed)
Please obtain labs.  I will call you with your results.  Please check BP daily and keep a record of this to bring to follow-up in 2 weeks.  Please read information below.  Gastroesophageal Reflux Disease, Adult Gastroesophageal reflux disease (GERD) happens when acid from your stomach flows up into the esophagus. When acid comes in contact with the esophagus, the acid causes soreness (inflammation) in the esophagus. Over time, GERD may create small holes (ulcers) in the lining of the esophagus. CAUSES   Increased body weight. This puts pressure on the stomach, making acid rise from the stomach into the esophagus.  Smoking. This increases acid production in the stomach.  Drinking alcohol. This causes decreased pressure in the lower esophageal sphincter (valve or ring of muscle between the esophagus and stomach), allowing acid from the stomach into the esophagus.  Late evening meals and a full stomach. This increases pressure and acid production in the stomach.  A malformed lower esophageal sphincter. Sometimes, no cause is found. SYMPTOMS   Burning pain in the lower part of the mid-chest behind the breastbone and in the mid-stomach area. This may occur twice a week or more often.  Trouble swallowing.  Sore throat.  Dry cough.  Asthma-like symptoms including chest tightness, shortness of breath, or wheezing. DIAGNOSIS  Your caregiver may be able to diagnose GERD based on your symptoms. In some cases, X-rays and other tests may be done to check for complications or to check the condition of your stomach and esophagus. TREATMENT  Your caregiver may recommend over-the-counter or prescription medicines to help decrease acid production. Ask your caregiver before starting or adding any new medicines.  HOME CARE INSTRUCTIONS   Change the factors that you can control. Ask your caregiver for guidance concerning weight loss, quitting smoking, and alcohol consumption.  Avoid foods and drinks  that make your symptoms worse, such as:  Caffeine or alcoholic drinks.  Chocolate.  Peppermint or mint flavorings.  Garlic and onions.  Spicy foods.  Citrus fruits, such as oranges, lemons, or limes.  Tomato-based foods such as sauce, chili, salsa, and pizza.  Fried and fatty foods.  Avoid lying down for the 3 hours prior to your bedtime or prior to taking a nap.  Eat small, frequent meals instead of large meals.  Wear loose-fitting clothing. Do not wear anything tight around your waist that causes pressure on your stomach.  Raise the head of your bed 6 to 8 inches with wood blocks to help you sleep. Extra pillows will not help.  Only take over-the-counter or prescription medicines for pain, discomfort, or fever as directed by your caregiver.  Do not take aspirin, ibuprofen, or other nonsteroidal anti-inflammatory drugs (NSAIDs). SEEK IMMEDIATE MEDICAL CARE IF:   You have pain in your arms, neck, jaw, teeth, or back.  Your pain increases or changes in intensity or duration.  You develop nausea, vomiting, or sweating (diaphoresis).  You develop shortness of breath, or you faint.  Your vomit is green, yellow, black, or looks like coffee grounds or blood.  Your stool is red, bloody, or black. These symptoms could be signs of other problems, such as heart disease, gastric bleeding, or esophageal bleeding. MAKE SURE YOU:   Understand these instructions.  Will watch your condition.  Will get help right away if you are not doing well or get worse. Document Released: 07/16/2005 Document Revised: 12/29/2011 Document Reviewed: 04/25/2011  Smoking Cessation, Tips for Success YOU CAN QUIT SMOKING If you are ready to quit  smoking, congratulations! You have chosen to help yourself be healthier. Cigarettes bring nicotine, tar, carbon monoxide, and other irritants into your body. Your lungs, heart, and blood vessels will be able to work better without these poisons. There are  many different ways to quit smoking. Nicotine gum, nicotine patches, a nicotine inhaler, or nicotine nasal spray can help with physical craving. Hypnosis, support groups, and medicines help break the habit of smoking. Here are some tips to help you quit for good.  Throw away all cigarettes.  Clean and remove all ashtrays from your home, work, and car.  On a card, write down your reasons for quitting. Carry the card with you and read it when you get the urge to smoke.  Cleanse your body of nicotine. Drink enough water and fluids to keep your urine clear or pale yellow. Do this after quitting to flush the nicotine from your body.  Learn to predict your moods. Do not let a bad situation be your excuse to have a cigarette. Some situations in your life might tempt you into wanting a cigarette.  Never have "just one" cigarette. It leads to wanting another and another. Remind yourself of your decision to quit.  Change habits associated with smoking. If you smoked while driving or when feeling stressed, try other activities to replace smoking. Stand up when drinking your coffee. Brush your teeth after eating. Sit in a different chair when you read the paper. Avoid alcohol while trying to quit, and try to drink fewer caffeinated beverages. Alcohol and caffeine may urge you to smoke.  Avoid foods and drinks that can trigger a desire to smoke, such as sugary or spicy foods and alcohol.  Ask people who smoke not to smoke around you.  Have something planned to do right after eating or having a cup of coffee. Take a walk or exercise to perk you up. This will help to keep you from overeating.  Try a relaxation exercise to calm you down and decrease your stress. Remember, you may be tense and nervous for the first 2 weeks after you quit, but this will pass.  Find new activities to keep your hands busy. Play with a pen, coin, or rubber band. Doodle or draw things on paper.  Brush your teeth right after  eating. This will help cut down on the craving for the taste of tobacco after meals. You can try mouthwash, too.  Use oral substitutes, such as lemon drops, carrots, a cinnamon stick, or chewing gum, in place of cigarettes. Keep them handy so they are available when you have the urge to smoke.  When you have the urge to smoke, try deep breathing.  Designate your home as a nonsmoking area.  If you are a heavy smoker, ask your caregiver about a prescription for nicotine chewing gum. It can ease your withdrawal from nicotine.  Reward yourself. Set aside the cigarette money you save and buy yourself something nice.  Look for support from others. Join a support group or smoking cessation program. Ask someone at home or at work to help you with your plan to quit smoking.  Always ask yourself, "Do I need this cigarette or is this just a reflex?" Tell yourself, "Today, I choose not to smoke," or "I do not want to smoke." You are reminding yourself of your decision to quit, even if you do smoke a cigarette. HOW WILL I FEEL WHEN I QUIT SMOKING?  The benefits of not smoking start within days of quitting.  You may have symptoms of withdrawal because your body is used to nicotine (the addictive substance in cigarettes). You may crave cigarettes, be irritable, feel very hungry, cough often, get headaches, or have difficulty concentrating.  The withdrawal symptoms are only temporary. They are strongest when you first quit but will go away within 10 to 14 days.  When withdrawal symptoms occur, stay in control. Think about your reasons for quitting. Remind yourself that these are signs that your body is healing and getting used to being without cigarettes.  Remember that withdrawal symptoms are easier to treat than the major diseases that smoking can cause.  Even after the withdrawal is over, expect periodic urges to smoke. However, these cravings are generally short-lived and will go away whether you  smoke or not. Do not smoke!  If you relapse and smoke again, do not lose hope. Most smokers quit 3 times before they are successful.  If you relapse, do not give up! Plan ahead and think about what you will do the next time you get the urge to smoke. LIFE AS A NONSMOKER: MAKE IT FOR A MONTH, MAKE IT FOR LIFE Day 1: Hang this page where you will see it every day. Day 2: Get rid of all ashtrays, matches, and lighters. Day 3: Drink water. Breathe deeply between sips. Day 4: Avoid places with smoke-filled air, such as bars, clubs, or the smoking section of restaurants. Day 5: Keep track of how much money you save by not smoking. Day 6: Avoid boredom. Keep a good book with you or go to the movies. Day 7: Reward yourself! One week without smoking! Day 8: Make a dental appointment to get your teeth cleaned. Day 9: Decide how you will turn down a cigarette before it is offered to you. Day 10: Review your reasons for quitting. Day 11: Distract yourself. Stay active to keep your mind off smoking and to relieve tension. Take a walk, exercise, read a book, do a crossword puzzle, or try a new hobby. Day 12: Exercise. Get off the bus before your stop or use stairs instead of escalators. Day 13: Call on friends for support and encouragement. Day 14: Reward yourself! Two weeks without smoking! Day 15: Practice deep breathing exercises. Day 16: Bet a friend that you can stay a nonsmoker. Day 17: Ask to sit in nonsmoking sections of restaurants. Day 18: Hang up "No Smoking" signs. Day 19: Think of yourself as a nonsmoker. Day 20: Each morning, tell yourself you will not smoke. Day 21: Reward yourself! Three weeks without smoking! Day 22: Think of smoking in negative ways. Remember how it stains your teeth, gives you bad breath, and leaves you short of breath. Day 23: Eat a nutritious breakfast. Day 24:Do not relive your days as a smoker. Day 25: Hold a pencil in your hand when talking on the  telephone. Day 26: Tell all your friends you do not smoke. Day 27: Think about how much better food tastes. Day 28: Remember, one cigarette is one too many. Day 29: Take up a hobby that will keep your hands busy. Day 30: Congratulations! One month without smoking! Give yourself a big reward. Your caregiver can direct you to community resources or hospitals for support, which may include:  Group support.  Education.  Hypnosis.  Subliminal therapy. Document Released: 07/04/2004 Document Revised: 12/29/2011 Document Reviewed: 07/23/2009 [facility] Patient Information 2014 [place], [place].  [employer] Patient Information 2014 [place], [place].

## 2013-08-08 NOTE — Assessment & Plan Note (Signed)
Sample of Nexium given for a 4 -week trial.

## 2013-08-08 NOTE — Assessment & Plan Note (Signed)
Continue current management.  Patient will need PFTs performed to assess severity of COPD.

## 2013-08-08 NOTE — Progress Notes (Signed)
Patient ID: Clemmie Buelna, female   DOB: [birth year], 67 y.o.   MRN: [id number]  Patient presents to clinic today for 1 month follow-up for elevated BP and COPD.  Patient states she is doing well with her COPD symptoms while on Advair.  Is only using Albuterol inhaler occasionally.  Patient also endorses following a low-salt diet.  States she checks her BP at home which is normally good.  Did not bring log of blood pressures to visit.  Denies headache, chest pain, palpitations, lightheadedness, dizziness or syncope.    Patient does endorse problems with indigestion and acid reflux that have been bothering her intermittently over the past two weeks.  Does endorse she eats right before going to bed.  Denies alcohol consumption.  Is trying to diet and exercise.   Past Medical History  Diagnosis Date  . Arthritis   . COPD (chronic obstructive pulmonary disease)     no history of PFTs, only diagnosed by CXR, not on any inhalers because she has not had a PCP in over 2 years  . Hypertension     previously on Lisinopril/HCTZ  . History of heartburn   . Hypercholesteremia     previously on pravastatin   . Cholelithiasis 2011    s/p cholescystectomy   . Renal cell carcinoma 01/2010    s/p right radical nephrectomy 01/2010,  followed [facility]  . Murmur     no  prior work-up  . GERD (gastroesophageal reflux disease)   . Emphysema 2010    dx'd/CXR  . Shortness of breath 09/26/11    "lately all the time"  . Pneumonia 1980's    "walking"    Current Outpatient Prescriptions on File Prior to Visit  Medication Sig Dispense Refill  . albuterol (PROVENTIL HFA;VENTOLIN HFA) 108 (90 BASE) MCG/ACT inhaler Inhale 2 puffs into the lungs every 4 (four) hours as needed for wheezing.  1 Inhaler  0  . Fluticasone-Salmeterol (ADVAIR) 250-50 MCG/DOSE AEPB Inhale 1 puff into the lungs every 12 (twelve) hours.  60 each  3  . ibuprofen (ADVIL,MOTRIN) 200 MG tablet Take 400 mg by mouth every 6 (six) hours  as needed. For pain        . zolpidem (AMBIEN) 5 MG tablet Take 1 tablet (5 mg total) by mouth at bedtime as needed for sleep.  30 tablet  0   No current facility-administered medications on file prior to visit.    No Known Allergies  Family History  Problem Relation Age of Onset  . COPD Mother     DECEASED    History   Social History  . Marital Status: Married    Spouse Name: N/A    Number of Children: 3  . Years of Education: 12   Occupational History  . Futures trader business   Social History Main Topics  . Smoking status: Current Every Day Smoker -- 1.00 packs/day for 50 years    Types: Cigarettes    Last Attempt to Quit: 09/25/2011  . Smokeless tobacco: Never Used     Comment: smoking cessation consult entered  . Alcohol Use: Yes     Comment:  drink very rarely"  . Drug Use: No     Comment: hx of cocaine last in 2006  . Sexual Activity: No   Other Topics Concern  . None   Social History Narrative   Lives in [place] with her husband.    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    Patient has 3 grown children that do not live with her.    Patient does not have any medical insurance.   ROS See HPI.  All other ROS are negative.   Filed Vitals:   08/08/13 0750  BP: 124/90  Temp: 98.6 F (37 C)    Physical Exam  Vitals reviewed. Constitutional: She is oriented to person, place, and time and well-developed, well-nourished, and in no distress.  HENT:  Head: Normocephalic and atraumatic.  Eyes: Conjunctivae are normal.  Neck: Neck supple.  Cardiovascular: Normal rate, regular rhythm and normal heart sounds.   Pulmonary/Chest: Effort normal and breath sounds normal. No respiratory distress. She has no wheezes. She has no rales. She exhibits no tenderness.  Abdominal: Bowel sounds are normal. She exhibits no distension. There is no tenderness.  Neurological: She is alert and oriented to person, place,  and time.  Skin: Skin is warm and dry. No rash noted.  Psychiatric: Affect normal.     Recent Results (from the past 2160 hour(s))  CBC     Status: None   Collection Time    07/12/13  4:14 PM      Result Value Range   WBC 5.4  4.0 - 10.5 K/uL   RBC 4.11  3.87 - 5.11 MIL/uL   Hemoglobin 12.3  12.0 - 15.0 g/dL   HCT 40.9  81.1 - 91.4 %   MCV 89.5  78.0 - 100.0 fL   MCH 29.9  26.0 - 34.0 pg   MCHC 33.4  30.0 - 36.0 g/dL   RDW 78.2  95.6 - 21.3 %   Platelets 271  150 - 400 K/uL  BASIC METABOLIC PANEL     Status: Abnormal   Collection Time    07/12/13  4:14 PM      Result Value Range   Sodium 139  135 - 145 mEq/L   Potassium 4.1  3.5 - 5.1 mEq/L   Chloride 103  96 - 112 mEq/L   CO2 25  19 - 32 mEq/L   Glucose, Bld 147 (*) 70 - 99 mg/dL   BUN 19  6 - 23 mg/dL   Creatinine, Ser 0.86 (*) 0.50 - 1.10 mg/dL   Calcium 9.8  8.4 - 57.8 mg/dL   GFR calc non Af Amer 41 (*) >90 mL/min   GFR calc Af Amer 47 (*) >90 mL/min   Comment: (NOTE)     The eGFR has been calculated using the CKD EPI equation.     This calculation has not been validated in all clinical situations.     eGFR's persistently <90 mL/min signify possible Chronic Kidney     Disease.  POCT I-STAT TROPONIN I     Status: None   Collection Time    07/12/13  4:32 PM      Result Value Range   Troponin i, poc 0.00  0.00 - 0.08 ng/mL   Comment 3            Comment: Due to the release kinetics of cTnI,     a negative result within the first hours     of the onset of symptoms does not rule out     myocardial infarction with certainty.     If myocardial infarction is still suspected,     repeat the test at appropriate intervals.    Assessment/Plan: COPD (chronic obstructive pulmonary disease) Continue current management.  Patient will need PFTs performed to assess  severity of COPD.  HTN (hypertension) Controlled with diet/exercise in the past.  DBP elevated at 90 today.  Encouraged patient to keep a log of blood  pressures for [place] to take a look at during her follow-up visit.  Acid reflux Sample of Nexium given for a 4 -week trial.

## 2013-08-08 NOTE — Assessment & Plan Note (Signed)
Controlled with diet/exercise in the past.  DBP elevated at 90 today.  Encouraged patient to keep a log of blood pressures for [place] to take a look at during her follow-up visit.

## 2013-08-09 ENCOUNTER — Telehealth: Payer: Self-pay | Admitting: Physician Assistant

## 2013-08-09 DIAGNOSIS — N289 Disorder of kidney and ureter, unspecified: Secondary | ICD-10-CM

## 2013-08-09 DIAGNOSIS — Z79899 Other long term (current) drug therapy: Secondary | ICD-10-CM

## 2013-08-09 DIAGNOSIS — E785 Hyperlipidemia, unspecified: Secondary | ICD-10-CM

## 2013-08-09 LAB — COMPREHENSIVE METABOLIC PANEL
ALT: 12 U/L (ref 0–35)
AST: 19 U/L (ref 0–37)
Albumin: 4.4 g/dL (ref 3.5–5.2)
Alkaline Phosphatase: 81 U/L (ref 39–117)
BUN: 22 mg/dL (ref 6–23)
Chloride: 104 mEq/L (ref 96–112)
Potassium: 4.9 mEq/L (ref 3.5–5.3)
Sodium: 138 mEq/L (ref 135–145)
Total Bilirubin: 0.3 mg/dL (ref 0.3–1.2)
Total Protein: 7.6 g/dL (ref 6.0–8.3)

## 2013-08-09 LAB — LIPID PANEL
HDL: 63 mg/dL (ref >39)
LDL Cholesterol: 191 mg/dL — ABNORMAL HIGH (ref 0–99)
Triglycerides: 358 mg/dL — ABNORMAL HIGH (ref <150)
VLDL: 72 mg/dL — ABNORMAL HIGH (ref 0–40)

## 2013-08-09 LAB — URINALYSIS, ROUTINE W REFLEX MICROSCOPIC
Bilirubin Urine: NEGATIVE
Glucose, UA: NEGATIVE mg/dL
Hgb urine dipstick: NEGATIVE
Ketones, ur: NEGATIVE mg/dL
Protein, ur: NEGATIVE mg/dL
Urobilinogen, UA: 0.2 mg/dL (ref 0.0–1.0)

## 2013-08-10 ENCOUNTER — Other Ambulatory Visit: Payer: Self-pay | Admitting: *Deleted

## 2013-08-10 MED ORDER — ATORVASTATIN CALCIUM 20 MG PO TABS: 20.0000 mg | ORAL_TABLET | Freq: Every day | ORAL | Status: DC

## 2013-08-10 NOTE — Telephone Encounter (Signed)
LMOM with contact name and number for return call RE: results, referral and further provider instructions/SLS

## 2013-08-10 NOTE — Telephone Encounter (Signed)
Please inform patient that her cholesterol is high and we should restart cholesterol medication.  I would like to start with lipitor 20 mg daily.  I will send Rx to her pharmacy.  She also should begin a diet low in saturated fats, cholesterol and refined sugar intake.  Also, her kidney function is still abnormal.  Because she has one kidney, I am putting in a referral for her to see a nephrologist.  I want to see her in 1 month for follow-up of cholesterol.

## 2013-08-10 NOTE — Progress Notes (Signed)
Per [facility] pharmacy, Albuterol nebulizer solution Rx sent in was for type that needs to be measured out by patient, inquire as to if [facility] to change to vials. Could not reach pharmacy to speak directly with pharmacist, so [facility] per provider to change to pre-measured Albuterol vials for Nebulizer/SLS

## 2013-08-11 NOTE — Telephone Encounter (Signed)
Patient informed, understood & agreed; rescheduled 11.03.14 OV to 11.20.14 for 1-mth out from new medication; future labs ordered & pt will have drawn prior to appt/SLS

## 2013-08-15 ENCOUNTER — Telehealth: Payer: Self-pay

## 2013-08-15 ENCOUNTER — Encounter: Payer: Self-pay | Admitting: Physician Assistant

## 2013-08-15 ENCOUNTER — Ambulatory Visit (INDEPENDENT_AMBULATORY_CARE_PROVIDER_SITE_OTHER): Payer: Medicare Other | Admitting: Physician Assistant

## 2013-08-15 VITALS — BP 146/90 | HR 66 | Temp 98.6°F | Ht 61.0 in | Wt 150.2 lb

## 2013-08-15 DIAGNOSIS — Z23 Encounter for immunization: Secondary | ICD-10-CM | POA: Insufficient documentation

## 2013-08-15 DIAGNOSIS — I10 Essential (primary) hypertension: Secondary | ICD-10-CM

## 2013-08-15 MED ORDER — LISINOPRIL 10 MG PO TABS: 10.0000 mg | ORAL_TABLET | Freq: Every day | ORAL | Status: DC

## 2013-08-15 NOTE — Patient Instructions (Signed)
Please begin taking lisinopril (10 mg) 1 tablet daily.  Follow-up in 1 month.  If you begin to develop a chronic cough, please stop the medication and give [place] a call.

## 2013-08-15 NOTE — Progress Notes (Signed)
Patient ID: Katie [name], female   DOB: Aug 05, 1946, 67 y.o.   MRN: [id number]  Patient presents to clinic today c/o elevated BP readings at home.  Patient states BP is checked daily and recordings have all been in the 140s/90s.  Patients BP elevated today at 146/90.  BP at last visit revealed normal SBP and elevated DBP.  Patient denies chest pain, palpitations, shortness of breath, lightheadedness, dizziness, headache.  Patient still smokes occasionally, even with prior counseling and diagnosis of COPD.  Past Medical History  Diagnosis Date  . Arthritis   . COPD (chronic obstructive pulmonary disease)     no history of PFTs, only diagnosed by CXR, not on any inhalers because she has not had a PCP in over 2 years  . Hypertension     previously on Lisinopril/HCTZ  . History of heartburn   . Hypercholesteremia     previously on pravastatin   . Cholelithiasis 2011    s/p cholescystectomy   . Renal cell carcinoma 01/2010    s/p right radical nephrectomy 01/2010,  follow[facility]  . Murmur     no  prior work-up  . GERD (gastroesophageal reflux disease)   . Emphysema 2010    dx'd/CXR  . Shortness of breath 09/26/11    "lately all the time"  . Pneumonia 1980's    "walking"    Current Outpatient Prescriptions on File Prior to Visit  Medication Sig Dispense Refill  . albuterol (PROVENTIL HFA;VENTOLIN HFA) 108 (90 BASE) MCG/ACT inhaler Inhale 2 puffs into the lungs every 4 (four) hours as needed for wheezing.  1 Inhaler  0  . albuterol (PROVENTIL) (2.5 MG/3ML) 0.083% nebulizer solution Take 3 mLs (2.5 mg total) by nebulization every 6 (six) hours as needed for wheezing.  75 mL  12  . atorvastatin (LIPITOR) 20 MG tablet Take 1 tablet (20 mg total) by mouth daily.  90 tablet  3  . Fluticasone-Salmeterol (ADVAIR) 250-50 MCG/DOSE AEPB Inhale 1 puff into the lungs every 12 (twelve) hours.  60 each  3  . ibuprofen (ADVIL,MOTRIN) 200 MG tablet Take 400 mg by mouth every 6 (six) hours  as needed. For pain        . zolpidem (AMBIEN) 5 MG tablet Take 1 tablet (5 mg total) by mouth at bedtime as needed for sleep.  30 tablet  0   No current facility-administered medications on file prior to visit.    No Known Allergies  Family History  Problem Relation Age of Onset  . COPD Mother     DECEASED    History   Social History  . Marital Status: Married    Spouse Name: N/A    Number of Children: 3  . Years of Education: 12   Occupational History  . Futures trader business   Social History Main Topics  . Smoking status: Current Every Day Smoker -- 1.00 packs/day for 50 years    Types: Cigarettes    Last Attempt to Quit: 09/25/2011  . Smokeless tobacco: Never Used     Comment: smoking cessation consult entered  . Alcohol Use: Yes     Comment:  drink very rarely"  . Drug Use: No     Comment: hx of cocaine last in 2006  . Sexual Activity: No   Other Topics Concern  . None   Social History Narrative   Lives in [place] with her husband.    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    Patient has 3 grown children that do not live with her.    Patient does not have any medical insurance.   ROS See HPI.  All other ROS are negative.  Filed Vitals:   08/15/13 1001  BP: 146/90  Pulse: 66  Temp: 98.6 F (37 C)    Physical Exam  Constitutional: She is oriented to person, place, and time and well-developed, well-nourished, and in no distress.  HENT:  Head: Normocephalic and atraumatic.  Eyes: Conjunctivae are normal.  Neck: Normal range of motion. Neck supple.  Cardiovascular: Normal rate, regular rhythm, normal heart sounds and intact distal pulses.   Pulmonary/Chest: Effort normal and breath sounds normal. No respiratory distress. She has no wheezes. She has no rales. She exhibits no tenderness.  Neurological: She is alert and oriented to person, place, and time.  Skin: Skin is warm and dry. No rash  noted.  Psychiatric: Affect normal.   Recent Results (from the past 2160 hour(s))  CBC     Status: None   Collection Time    07/12/13  4:14 PM      Result Value Range   WBC 5.4  4.0 - 10.5 K/uL   RBC 4.11  3.87 - 5.11 MIL/uL   Hemoglobin 12.3  12.0 - 15.0 g/dL   HCT 16.1  09.6 - 04.5 %   MCV 89.5  78.0 - 100.0 fL   MCH 29.9  26.0 - 34.0 pg   MCHC 33.4  30.0 - 36.0 g/dL   RDW 40.9  81.1 - 91.4 %   Platelets 271  150 - 400 K/uL  BASIC METABOLIC PANEL     Status: Abnormal   Collection Time    07/12/13  4:14 PM      Result Value Range   Sodium 139  135 - 145 mEq/L   Potassium 4.1  3.5 - 5.1 mEq/L   Chloride 103  96 - 112 mEq/L   CO2 25  19 - 32 mEq/L   Glucose, Bld 147 (*) 70 - 99 mg/dL   BUN 19  6 - 23 mg/dL   Creatinine, Ser 7.82 (*) 0.50 - 1.10 mg/dL   Calcium 9.8  8.4 - 95.6 mg/dL   GFR calc non Af Amer 41 (*) >90 mL/min   GFR calc Af Amer 47 (*) >90 mL/min   Comment: (NOTE)     The eGFR has been calculated using the CKD EPI equation.     This calculation has not been validated in all clinical situations.     eGFR's persistently <90 mL/min signify possible Chronic Kidney     Disease.  POCT I-STAT TROPONIN I     Status: None   Collection Time    07/12/13  4:32 PM      Result Value Range   Troponin i, poc 0.00  0.00 - 0.08 ng/mL   Comment 3            Comment: Due to the release kinetics of cTnI,     a negative result within the first hours     of the onset of symptoms does not rule out     myocardial infarction with certainty.     If myocardial infarction is still suspected,     repeat the test at appropriate intervals.  URINALYSIS, ROUTINE W REFLEX MICROSCOPIC     Status: None   Collection Time    08/08/13  8:11 AM      Result  Value Range   Color, Urine YELLOW  YELLOW   APPearance CLEAR  CLEAR   Specific Gravity, Urine 1.015  1.005 - 1.030   pH 5.0  5.0 - 8.0   Glucose, UA NEG  NEG mg/dL   Bilirubin Urine NEG  NEG   Ketones, ur NEG  NEG mg/dL   Hgb urine  dipstick NEG  NEG   Protein, ur NEG  NEG mg/dL   Urobilinogen, UA 0.2  0.0 - 1.0 mg/dL   Nitrite NEG  NEG   Leukocytes, UA NEG  NEG  LIPID PANEL     Status: Abnormal   Collection Time    08/08/13  8:43 AM      Result Value Range   Cholesterol 326 (*) 0 - 200 mg/dL   Comment: ATP III Classification:           < 200        mg/dL        Desirable          200 - 239     mg/dL        Borderline High          >= 240        mg/dL        High         Triglycerides 358 (*) <150 mg/dL   HDL 63  >40 mg/dL   Total CHOL/HDL Ratio 5.2     VLDL 72 (*) 0 - 40 mg/dL   LDL Cholesterol 981 (*) 0 - 99 mg/dL   Comment:       Total Cholesterol/HDL Ratio:CHD Risk                            Coronary Heart Disease Risk Table                                            Men       Women              1/2 Average Risk              3.4        3.3                  Average Risk              5.0        4.4               2X Average Risk              9.6        7.1               3X Average Risk             23.4       11.0     Use the calculated Patient Ratio above and the CHD Risk table      to determine the patient's CHD Risk.     ATP III Classification (LDL):           < 100        mg/dL         Optimal          100 - 129     mg/dL  Near or Above Optimal          130 - 159     mg/dL         Borderline High          160 - 189     mg/dL         High           > 190        mg/dL         Very High        CBC WITH DIFFERENTIAL     Status: Abnormal   Collection Time    08/08/13  8:43 AM      Result Value Range   WBC 5.7  4.0 - 10.5 K/uL   RBC 4.22  3.87 - 5.11 MIL/uL   Hemoglobin 12.5  12.0 - 15.0 g/dL   HCT 16.1  09.6 - 04.5 %   MCV 86.5  78.0 - 100.0 fL   MCH 29.6  26.0 - 34.0 pg   MCHC 34.2  30.0 - 36.0 g/dL   RDW 40.9  81.1 - 91.4 %   Platelets 350  150 - 400 K/uL   Neutrophils Relative % 52  43 - 77 %   Neutro Abs 3.0  1.7 - 7.7 K/uL   Lymphocytes Relative 31  12 - 46 %   Lymphs Abs 1.8  0.7  - 4.0 K/uL   Monocytes Relative 7  3 - 12 %   Monocytes Absolute 0.4  0.1 - 1.0 K/uL   Eosinophils Relative 9 (*) 0 - 5 %   Eosinophils Absolute 0.5  0.0 - 0.7 K/uL   Basophils Relative 1  0 - 1 %   Basophils Absolute 0.1  0.0 - 0.1 K/uL   Smear Review Criteria for review not met    COMPREHENSIVE METABOLIC PANEL     Status: Abnormal   Collection Time    08/08/13  8:43 AM      Result Value Range   Sodium 138  135 - 145 mEq/L   Potassium 4.9  3.5 - 5.3 mEq/L   Chloride 104  96 - 112 mEq/L   CO2 26  19 - 32 mEq/L   Glucose, Bld 81  70 - 99 mg/dL   BUN 22  6 - 23 mg/dL   Creat 7.82 (*) 9.56 - 1.10 mg/dL   Total Bilirubin 0.3  0.3 - 1.2 mg/dL   Alkaline Phosphatase 81  39 - 117 U/L   AST 19  0 - 37 U/L   ALT 12  0 - 35 U/L   Total Protein 7.6  6.0 - 8.3 g/dL   Albumin 4.4  3.5 - 5.2 g/dL   Calcium 21.3  8.4 - 08.6 mg/dL   Assessment/Plan: HTN (hypertension) Will begin Rx Lisinopril 10 mg. Creatinine Clearance calculated in low 40s.  Referral to Nephrology in process given patient's history of solitary kidney and chronic mild insufficiency.  Need for prophylactic vaccination against Streptococcus pneumoniae (pneumococcus) Vaccine given by nursing staff.

## 2013-08-15 NOTE — Telephone Encounter (Signed)
For BP -- I would like for patient to record her blood pressure measurements at home.  I'd like for her to schedule a quick 15-minute visit for [place] to recheck her BP and talk about medication options.  For her kidney function -- I placed an order for referral to Nephrology on 08/09/13.  Our referral coordinator does not get here until 8am, but I will discuss it with her to see if she can check-up with the Nephrologist's office.  She should hear back from either our office or the specialists office.

## 2013-08-15 NOTE — Telephone Encounter (Signed)
Patient states she has been monitoring her BP for the past week and it has ranged from 140/90 to today being 160/90. She would like to know what should be done about this, she is not a BP medication currently. Please advise.

## 2013-08-15 NOTE — Assessment & Plan Note (Signed)
Vaccine given by nursing staff. 

## 2013-08-15 NOTE — Assessment & Plan Note (Signed)
Will begin Rx Lisinopril 10 mg. Creatinine Clearance calculated in low 40s.  Referral to Nephrology in process given patient's history of solitary kidney and chronic mild insufficiency.

## 2013-08-15 NOTE — Telephone Encounter (Signed)
Spoke with patient and notified her of information below, pt agreed to schedule OV to discuss BP options...ds,cma

## 2013-08-22 ENCOUNTER — Ambulatory Visit: Payer: Self-pay | Admitting: Physician Assistant

## 2013-09-08 ENCOUNTER — Ambulatory Visit: Payer: Self-pay | Admitting: Physician Assistant

## 2013-09-21 ENCOUNTER — Ambulatory Visit: Payer: Self-pay | Admitting: Physician Assistant

## 2013-10-24 ENCOUNTER — Ambulatory Visit: Payer: Self-pay | Admitting: Physician Assistant

## 2013-10-24 DIAGNOSIS — Z0289 Encounter for other administrative examinations: Secondary | ICD-10-CM

## 2013-11-11 DIAGNOSIS — I129 Hypertensive chronic kidney disease with stage 1 through stage 4 chronic kidney disease, or unspecified chronic kidney disease: Secondary | ICD-10-CM | POA: Diagnosis not present

## 2013-11-11 DIAGNOSIS — D631 Anemia in chronic kidney disease: Secondary | ICD-10-CM | POA: Diagnosis not present

## 2013-11-11 DIAGNOSIS — N183 Chronic kidney disease, stage 3 unspecified: Secondary | ICD-10-CM | POA: Diagnosis not present

## 2013-11-11 DIAGNOSIS — N2581 Secondary hyperparathyroidism of renal origin: Secondary | ICD-10-CM | POA: Diagnosis not present

## 2013-11-15 ENCOUNTER — Other Ambulatory Visit: Payer: Self-pay | Admitting: Nephrology

## 2013-11-15 DIAGNOSIS — Q600 Renal agenesis, unilateral: Secondary | ICD-10-CM

## 2013-11-15 DIAGNOSIS — N189 Chronic kidney disease, unspecified: Secondary | ICD-10-CM

## 2013-11-15 DIAGNOSIS — IMO0002 Reserved for concepts with insufficient information to code with codable children: Secondary | ICD-10-CM

## 2013-11-17 ENCOUNTER — Other Ambulatory Visit: Payer: Self-pay

## 2013-12-05 ENCOUNTER — Ambulatory Visit
Admission: RE | Admit: 2013-12-05 | Discharge: 2013-12-05 | Disposition: A | Discharge status: Home / Self Care | Payer: Medicare Other | Source: Ambulatory Visit | Attending: Nephrology | Admitting: Nephrology

## 2013-12-05 DIAGNOSIS — IMO0002 Reserved for concepts with insufficient information to code with codable children: Secondary | ICD-10-CM

## 2013-12-05 DIAGNOSIS — N189 Chronic kidney disease, unspecified: Secondary | ICD-10-CM

## 2013-12-05 DIAGNOSIS — Q600 Renal agenesis, unilateral: Secondary | ICD-10-CM

## 2014-01-04 ENCOUNTER — Telehealth: Payer: Self-pay | Admitting: Physician Assistant

## 2014-01-04 DIAGNOSIS — F4321 Adjustment disorder with depressed mood: Secondary | ICD-10-CM

## 2014-01-04 DIAGNOSIS — G4700 Insomnia, unspecified: Secondary | ICD-10-CM

## 2014-01-04 MED ORDER — ALPRAZOLAM 1 MG PO TABS: 1.0000 mg | ORAL_TABLET | Freq: Every day | ORAL | Status: DC

## 2014-01-04 NOTE — Telephone Encounter (Signed)
Patient states that her husband passed away last night and would like to know if Einar Pheasant would prescribe her something to help her sleep. She states that she has taken a sleep med before. She states that if you cannot reach her on her phone that it is okay to call her daughters phone at [phone]

## 2014-01-04 NOTE — Telephone Encounter (Signed)
Called & spoke w/female to verify pharmacy for patient [name] Rx request faxed to pharmacy/SLS

## 2014-01-04 NOTE — Telephone Encounter (Signed)
Rx xanax 1 mg at bedtime.   Call or return to clinic if needed for continued symptoms. I am sorry for her loss. Please let us know if our office can be of assistance to her.

## 2014-01-17 ENCOUNTER — Encounter: Payer: Self-pay | Admitting: Nurse Practitioner

## 2014-01-17 ENCOUNTER — Ambulatory Visit (INDEPENDENT_AMBULATORY_CARE_PROVIDER_SITE_OTHER): Payer: Medicare Other | Admitting: Nurse Practitioner

## 2014-01-17 ENCOUNTER — Inpatient Hospital Stay (HOSPITAL_COMMUNITY)
Admission: AD | Admit: 2014-01-17 | Discharge: 2014-01-19 | DRG: 192 | Disposition: A | Discharge status: Home / Self Care | Payer: Medicare Other | Source: Ambulatory Visit | Attending: Internal Medicine | Admitting: Internal Medicine

## 2014-01-17 ENCOUNTER — Encounter (HOSPITAL_COMMUNITY): Payer: Self-pay | Admitting: General Practice

## 2014-01-17 ENCOUNTER — Telehealth: Payer: Self-pay | Admitting: Physician Assistant

## 2014-01-17 VITALS — BP 154/99 | HR 95 | Temp 98.0°F | Resp 30 | Ht 61.0 in | Wt 147.0 lb

## 2014-01-17 DIAGNOSIS — K219 Gastro-esophageal reflux disease without esophagitis: Secondary | ICD-10-CM | POA: Diagnosis not present

## 2014-01-17 DIAGNOSIS — F17200 Nicotine dependence, unspecified, uncomplicated: Secondary | ICD-10-CM | POA: Diagnosis not present

## 2014-01-17 DIAGNOSIS — Z0000 Encounter for general adult medical examination without abnormal findings: Secondary | ICD-10-CM

## 2014-01-17 DIAGNOSIS — E785 Hyperlipidemia, unspecified: Secondary | ICD-10-CM | POA: Diagnosis present

## 2014-01-17 DIAGNOSIS — Z9049 Acquired absence of other specified parts of digestive tract: Secondary | ICD-10-CM

## 2014-01-17 DIAGNOSIS — Z139 Encounter for screening, unspecified: Secondary | ICD-10-CM

## 2014-01-17 DIAGNOSIS — J441 Chronic obstructive pulmonary disease with (acute) exacerbation: Secondary | ICD-10-CM | POA: Diagnosis not present

## 2014-01-17 DIAGNOSIS — F329 Major depressive disorder, single episode, unspecified: Secondary | ICD-10-CM | POA: Diagnosis present

## 2014-01-17 DIAGNOSIS — I10 Essential (primary) hypertension: Secondary | ICD-10-CM | POA: Diagnosis not present

## 2014-01-17 DIAGNOSIS — Z23 Encounter for immunization: Secondary | ICD-10-CM

## 2014-01-17 DIAGNOSIS — I059 Rheumatic mitral valve disease, unspecified: Secondary | ICD-10-CM

## 2014-01-17 DIAGNOSIS — Z85528 Personal history of other malignant neoplasm of kidney: Secondary | ICD-10-CM

## 2014-01-17 DIAGNOSIS — R059 Cough, unspecified: Secondary | ICD-10-CM | POA: Diagnosis not present

## 2014-01-17 DIAGNOSIS — C649 Malignant neoplasm of unspecified kidney, except renal pelvis: Secondary | ICD-10-CM

## 2014-01-17 DIAGNOSIS — Z905 Acquired absence of kidney: Secondary | ICD-10-CM

## 2014-01-17 DIAGNOSIS — J449 Chronic obstructive pulmonary disease, unspecified: Secondary | ICD-10-CM

## 2014-01-17 DIAGNOSIS — G4700 Insomnia, unspecified: Secondary | ICD-10-CM

## 2014-01-17 DIAGNOSIS — M129 Arthropathy, unspecified: Secondary | ICD-10-CM | POA: Diagnosis present

## 2014-01-17 DIAGNOSIS — F411 Generalized anxiety disorder: Secondary | ICD-10-CM | POA: Diagnosis present

## 2014-01-17 DIAGNOSIS — F3289 Other specified depressive episodes: Secondary | ICD-10-CM | POA: Diagnosis present

## 2014-01-17 DIAGNOSIS — I340 Nonrheumatic mitral (valve) insufficiency: Secondary | ICD-10-CM

## 2014-01-17 DIAGNOSIS — R011 Cardiac murmur, unspecified: Secondary | ICD-10-CM

## 2014-01-17 DIAGNOSIS — E7800 Pure hypercholesterolemia, unspecified: Secondary | ICD-10-CM | POA: Diagnosis not present

## 2014-01-17 LAB — COMPREHENSIVE METABOLIC PANEL
ALK PHOS: 83 U/L (ref 39–117)
ALT: 11 U/L (ref 0–35)
AST: 17 U/L (ref 0–37)
Albumin: 3.2 g/dL — ABNORMAL LOW (ref 3.5–5.2)
BUN: 16 mg/dL (ref 6–23)
CO2: 24 mEq/L (ref 19–32)
Calcium: 9.8 mg/dL (ref 8.4–10.5)
Chloride: 103 mEq/L (ref 96–112)
Creatinine, Ser: 1.34 mg/dL — ABNORMAL HIGH (ref 0.50–1.10)
GFR calc non Af Amer: 40 mL/min — ABNORMAL LOW (ref >90)
GFR, EST AFRICAN AMERICAN: 47 mL/min — AB (ref >90)
GLUCOSE: 98 mg/dL (ref 70–99)
POTASSIUM: 3.7 meq/L (ref 3.7–5.3)
SODIUM: 142 meq/L (ref 137–147)
Total Bilirubin: <0.2 mg/dL — ABNORMAL LOW (ref 0.3–1.2)
Total Protein: 6.8 g/dL (ref 6.0–8.3)

## 2014-01-17 LAB — CBC WITH DIFFERENTIAL/PLATELET
BASOS ABS: 0.1 10*3/uL (ref 0.0–0.1)
BASOS PCT: 1 % (ref 0–1)
Eosinophils Absolute: 0.2 10*3/uL (ref 0.0–0.7)
Eosinophils Relative: 4 % (ref 0–5)
HCT: 33.4 % — ABNORMAL LOW (ref 36.0–46.0)
Hemoglobin: 11.1 g/dL — ABNORMAL LOW (ref 12.0–15.0)
Lymphocytes Relative: 16 % (ref 12–46)
Lymphs Abs: 0.8 10*3/uL (ref 0.7–4.0)
MCH: 29.6 pg (ref 26.0–34.0)
MCHC: 33.2 g/dL (ref 30.0–36.0)
MCV: 89.1 fL (ref 78.0–100.0)
Monocytes Absolute: 0.3 10*3/uL (ref 0.1–1.0)
Monocytes Relative: 6 % (ref 3–12)
NEUTROS ABS: 3.8 10*3/uL (ref 1.7–7.7)
Neutrophils Relative %: 73 % (ref 43–77)
Platelets: 230 10*3/uL (ref 150–400)
RBC: 3.75 MIL/uL — ABNORMAL LOW (ref 3.87–5.11)
RDW: 12.8 % (ref 11.5–15.5)
WBC: 5.2 10*3/uL (ref 4.0–10.5)

## 2014-01-17 LAB — HIV ANTIBODY (ROUTINE TESTING W REFLEX): HIV: NONREACTIVE

## 2014-01-17 MED ORDER — GUAIFENESIN ER 600 MG PO TB12
1200.0000 mg | ORAL_TABLET | Freq: Two times a day (BID) | ORAL | Status: DC
  Administered 2014-01-17 – 2014-01-19 (×5): 1200 mg via ORAL
  Filled 2014-01-17 (×6): qty 2

## 2014-01-17 MED ORDER — ALPRAZOLAM 0.5 MG PO TABS
1.0000 mg | ORAL_TABLET | Freq: Every day | ORAL | Status: DC
  Administered 2014-01-17 – 2014-01-18 (×2): 1 mg via ORAL
  Filled 2014-01-17 (×2): qty 2

## 2014-01-17 MED ORDER — MOMETASONE FURO-FORMOTEROL FUM 100-5 MCG/ACT IN AERO
2.0000 | INHALATION_SPRAY | Freq: Two times a day (BID) | RESPIRATORY_TRACT | Status: DC
  Administered 2014-01-17 – 2014-01-19 (×4): 2 via RESPIRATORY_TRACT
  Filled 2014-01-17: qty 8.8

## 2014-01-17 MED ORDER — ALBUTEROL SULFATE (2.5 MG/3ML) 0.083% IN NEBU
2.5000 mg | INHALATION_SOLUTION | Freq: Once | RESPIRATORY_TRACT | Status: AC
  Administered 2014-01-17: 2.5 mg via RESPIRATORY_TRACT

## 2014-01-17 MED ORDER — ALBUTEROL SULFATE (2.5 MG/3ML) 0.083% IN NEBU: 2.5000 mg | INHALATION_SOLUTION | Freq: Four times a day (QID) | RESPIRATORY_TRACT | Status: DC | PRN

## 2014-01-17 MED ORDER — METHYLPREDNISOLONE ACETATE 40 MG/ML IJ SUSP
40.0000 mg | Freq: Once | INTRAMUSCULAR | Status: AC
  Administered 2014-01-17: 40 mg via INTRAMUSCULAR

## 2014-01-17 MED ORDER — ZOLPIDEM TARTRATE 5 MG PO TABS
5.0000 mg | ORAL_TABLET | Freq: Every evening | ORAL | Status: DC | PRN
  Administered 2014-01-17 – 2014-01-18 (×2): 5 mg via ORAL
  Filled 2014-01-17 (×2): qty 1

## 2014-01-17 MED ORDER — NICOTINE 21 MG/24HR TD PT24
21.0000 mg | MEDICATED_PATCH | TRANSDERMAL | Status: DC
  Administered 2014-01-17 – 2014-01-18 (×2): 21 mg via TRANSDERMAL
  Filled 2014-01-17 (×3): qty 1

## 2014-01-17 MED ORDER — IPRATROPIUM-ALBUTEROL 0.5-2.5 (3) MG/3ML IN SOLN
3.0000 mL | RESPIRATORY_TRACT | Status: DC
  Administered 2014-01-17 (×3): 3 mL via RESPIRATORY_TRACT
  Filled 2014-01-17 (×3): qty 3

## 2014-01-17 MED ORDER — METHYLPREDNISOLONE SODIUM SUCC 125 MG IJ SOLR
60.0000 mg | Freq: Two times a day (BID) | INTRAMUSCULAR | Status: DC
  Administered 2014-01-17 – 2014-01-19 (×4): 60 mg via INTRAVENOUS
  Filled 2014-01-17 (×6): qty 0.96

## 2014-01-17 MED ORDER — SODIUM CHLORIDE 0.9 % IV SOLN
INTRAVENOUS | Status: AC
  Administered 2014-01-17: 75 mL/h via INTRAVENOUS
  Administered 2014-01-18: 05:00:00 via INTRAVENOUS

## 2014-01-17 MED ORDER — LISINOPRIL 10 MG PO TABS
10.0000 mg | ORAL_TABLET | Freq: Every day | ORAL | Status: DC
  Administered 2014-01-17 – 2014-01-19 (×3): 10 mg via ORAL
  Filled 2014-01-17 (×3): qty 1

## 2014-01-17 MED ORDER — DEXTROSE 5 % IV SOLN
1.0000 g | INTRAVENOUS | Status: DC
  Administered 2014-01-17 – 2014-01-18 (×2): 1 g via INTRAVENOUS
  Filled 2014-01-17 (×3): qty 10

## 2014-01-17 MED ORDER — AZITHROMYCIN 500 MG IV SOLR
500.0000 mg | INTRAVENOUS | Status: DC
  Administered 2014-01-17 – 2014-01-18 (×2): 500 mg via INTRAVENOUS
  Filled 2014-01-17 (×3): qty 500

## 2014-01-17 MED ORDER — ATORVASTATIN CALCIUM 20 MG PO TABS
20.0000 mg | ORAL_TABLET | Freq: Every day | ORAL | Status: DC
  Administered 2014-01-17 – 2014-01-18 (×2): 20 mg via ORAL
  Filled 2014-01-17 (×3): qty 1

## 2014-01-17 MED ORDER — ALBUTEROL SULFATE (2.5 MG/3ML) 0.083% IN NEBU
2.5000 mg | INHALATION_SOLUTION | RESPIRATORY_TRACT | Status: DC | PRN
  Administered 2014-01-18: 2.5 mg via RESPIRATORY_TRACT
  Filled 2014-01-17: qty 3

## 2014-01-17 NOTE — Patient Instructions (Addendum)
Viral illness and emotional stress are likely exacerbating lung disease. You need close monitoring and hospital care. Please go to [facility] hospital admissions.    Chronic Obstructive Pulmonary Disease Chronic obstructive pulmonary disease (COPD) is a common lung problem. In COPD, the flow of air from the lungs is limited. The way your lungs work will probably never return to normal, but there are things you can do to improve you lungs and make yourself feel better. HOME CARE  Take all medicines as told by your doctor.  Only take over-the-counter or prescription medicines as told by your doctor.  Avoid medicines or cough syrups that dry up your airway (such as antihistamines) and do not allow you to get rid of thick spit. You do not need to avoid them if told differently by your doctor.  If you smoke, stop. Smoking makes the problem worse.  Avoid being around things that make your breathing worse (like smoke, chemicals, and fumes).  Use oxygen therapy and therapy to help improve your lungs (pulmonary rehabilitation) if told by your doctor. If you need home oxygen therapy, ask your doctor if you should buy a tool to measure your oxygen level (oximeter).  Avoid people who have a sickness you can catch (contagious).  Avoid going outside when it is very hot, cold, or humid.  Eat healthy foods. Eat smaller meals more often. Rest before meals.  Stay active, but remember to also rest.  Make sure to get all the shots (vaccines) your doctor recommends. Ask your doctor if you need a pneumonia shot.  Learn and use tips on how to relax.  Learn and use tips on how to control your breathing as told by your doctor. Try:  Breathing in (inhaling) through your nose for 1 second. Then, pucker your lips and breath out (exhale) through your lips for 2 seconds.  Putting one hand on your belly (abdomen). Breathe in slowly through your nose for 1 second. Your hand on your belly should move out. Pucker your  lips and breathe out slowly through your lips. Your hand on your belly should move in as you breathe out.  Learn and use controlled coughing to clear thick spit from your lungs. 1. Lean your head a little forward. 2. Breathe in deeply. 3. Try to hold your breath for 3 seconds. 4. Keep your mouth slightly open while coughing 2 times. 5. Spit any thick spit out into a tissue. 6. Rest and do the steps again 1 or 2 times as needed. GET HELP IF:  You cough up more thick spit than usual.  There is a change in the color or thickness of the spit.  It is harder to breathe than usual.  Your breathing is faster than usual. GET HELP RIGHT AWAY IF:   You have shortness of breath while resting.  You have shortness of breath that stops you from:  Being able to talk.  Doing normal activities.  You chest hurts for longer than 5 minutes.  Your skin color is more blue than usual.  Your pulse oximeter shows that you have low oxygen for longer than 5 minutes. MAKE SURE YOU:   Understand these instructions.  Will watch your condition.  Will get help right away if you are not doing well or get worse. Document Released: 03/24/2008 Document Revised: 07/27/2013 Document Reviewed: 06/02/2013 [facility] Patient Information 2014 [place], [place].

## 2014-01-17 NOTE — Telephone Encounter (Signed)
Relevant patient education mailed to patient.  

## 2014-01-17 NOTE — H&P (Signed)
[facility] History and Physical  Katie [name] QIW:[id number] DOB: February 12, 1946 DOA: 01/17/2014  Referring physician:  PCP: [name], PA-C  Specialists:   Chief Complaint: Shortness of breath  HPI: Katie [name] is a 68 y.o. female  With a history of COPD, renal cell carcinoma, who presented to her primary care physician's office today for shortness of breath and cough for 2 days. Patient has been exhibiting cold-like symptoms including chills with a nonproductive cough for approximately 2 days. She states her son also was recently. Patient has been using her nebulizer treatments at home, this is not relieved her shortness of breath. Patient does admit to smoking however quit 3 days ago and has used a nicotine patch. She denies using any home oxygen, CPAP or BiPAP. Patient has only tried taking Mucinex over-the-counter, however has had no relief.  Patient was sent to: As a direct admission from [name], Ms. Katie Pugh, NP.  Review of Systems:  Constitutional: Complains of chills and fatigue. Denies fever. HEENT: Complains of headache and congestion.   Respiratory:  complains of shortness of breath, cough, wheezing. Cardiovascular: Denies chest pain, palpitations and leg swelling.  Gastrointestinal: Denies nausea, vomiting, abdominal pain, diarrhea, constipation, blood in stool and abdominal distention.  Genitourinary: Denies dysuria, urgency, frequency, hematuria, flank pain and difficulty urinating.  Musculoskeletal: Denies myalgias, back pain, joint swelling, arthralgias and gait problem.  Skin: Denies pallor, rash and wound.  Neurological: Denies dizziness, seizures, syncope, weakness, light-headedness, numbness and headaches.  Hematological: Denies adenopathy. Easy bruising, personal or family bleeding history  Psychiatric/Behavioral: Denies suicidal ideation, mood changes, confusion, nervousness.  Admits to anxiety and sleep disturbance. Lost husband 2  weeks ago and is currently grieving.  Past Medical History  Diagnosis Date  . Arthritis   . COPD (chronic obstructive pulmonary disease)     no history of PFTs, only diagnosed by CXR, not on any inhalers because she has not had a PCP in over 2 years  . Hypertension     previously on Lisinopril/HCTZ  . History of heartburn   . Hypercholesteremia     previously on pravastatin   . Cholelithiasis 2011    s/p cholescystectomy   . Renal cell carcinoma 01/2010    s/p right radical nephrectomy 01/2010,  followed [facility]  . Murmur     no  prior work-up  . GERD (gastroesophageal reflux disease)   . Emphysema 2010    dx'd/CXR  . Shortness of breath 09/26/11    "lately all the time"  . Pneumonia 1980's    "walking"   Past Surgical History  Procedure Laterality Date  . Cholecystectomy  01/2010  . Nephrectomy radical  01/2010    right   . Tubal ligation  1970's  . Diagnostic laparoscopy    . Ovarian cyst removal  1970's    "went through belly button"  . Appendectomy  1970's  . Tee without cardioversion  10/01/2011    Procedure: TRANSESOPHAGEAL ECHOCARDIOGRAM (TEE);  Surgeon: [name];  Location: [place];  Service: Cardiovascular;  Laterality: N/A;  . US echocardiography  09/2011  . Cardiac catheterization  09/2011  . Multiple extractions with alveoloplasty  10/06/2011    Procedure: MULTIPLE EXTRACION WITH ALVEOLOPLASTY;  Surgeon: Lenn Cal, DDS;  Location: [place];  Service: Oral Surgery;  Laterality: N/A;  Multiple extraction of tooth #'s 1, 6, 8, 10, 11, 22, 23, 26, 27, 28, 29 with alveoloplasty and Upper right buccal exostoses reductions.   Social History:  reports that she has been smoking Cigarettes.  She has a 50 pack-year smoking history. She has never used smokeless tobacco. She reports that she drinks alcohol. She reports that she does not use illicit drugs. Lives at home with family. Recently quit smoking 3 days ago.  No Known  Allergies  Family History  Problem Relation Age of Onset  . COPD Mother     DECEASED    Prior to Admission medications   Medication Sig Start Date End Date Taking? Authorizing Provider  albuterol (PROVENTIL HFA;VENTOLIN HFA) 108 (90 BASE) MCG/ACT inhaler Inhale 2 puffs into the lungs every 4 (four) hours as needed for wheezing. 07/12/13   Tatyana A Kirichenko, PA-C  albuterol (PROVENTIL) (2.5 MG/3ML) 0.083% nebulizer solution Take 3 mLs (2.5 mg total) by nebulization every 6 (six) hours as needed for wheezing. 08/08/13   [name], PA-C  ALPRAZolam Duanne Moron) 1 MG tablet Take 1 tablet (1 mg total) by mouth at bedtime. 01/04/14   [name], PA-C  atorvastatin (LIPITOR) 20 MG tablet Take 1 tablet (20 mg total) by mouth daily. 08/10/13   [name], PA-C  Fluticasone-Salmeterol (ADVAIR) 250-50 MCG/DOSE AEPB Inhale 1 puff into the lungs every 12 (twelve) hours. 07/14/13   [name], PA-C  ibuprofen (ADVIL,MOTRIN) 200 MG tablet Take 400 mg by mouth every 6 (six) hours as needed. For pain      Historical Provider, MD  lisinopril (PRINIVIL,ZESTRIL) 10 MG tablet Take 1 tablet (10 mg total) by mouth daily. 08/15/13   [name], PA-C  zolpidem (AMBIEN) 5 MG tablet Take 1 tablet (5 mg total) by mouth at bedtime as needed for sleep. 07/14/13 04/19/15  [name], PA-C   Physical Exam: Filed Vitals:   01/17/14 1205  BP: 162/99  Pulse: 100  Temp: 98.4 F (36.9 C)  Resp: 22     General: Well developed, well nourished, NAD, appears stated age  HEENT: NCAT, PERRLA, EOMI, Anicteic Sclera, mucous membranes moist.   Neck: Supple, no JVD, no masses  Cardiovascular: S1 S2 auscultated, 1/6 SEM, Regular rate and rhythm.  Respiratory: Diminished breath sounds, bibasilar Rales, expiratory wheezing noted throughout.  Abdomen: Soft, nontender, nondistended, + bowel sounds  Extremities: warm dry without cyanosis clubbing or edema  Neuro: AAOx3,  cranial nerves grossly intact. Strength 5/5 in patient's upper and lower extremities bilaterally  Skin: Without rashes exudates or nodules  Psych: Normal affect and demeanor with intact judgement and insight  Labs on Admission:  Basic Metabolic Panel: No results found for this basename: NA, K, CL, CO2, GLUCOSE, BUN, CREATININE, CALCIUM, MG, PHOS,  in the last 168 hours Liver Function Tests: No results found for this basename: AST, ALT, ALKPHOS, BILITOT, PROT, ALBUMIN,  in the last 168 hours No results found for this basename: LIPASE, AMYLASE,  in the last 168 hours No results found for this basename: AMMONIA,  in the last 168 hours CBC: No results found for this basename: WBC, NEUTROABS, HGB, HCT, MCV, PLT,  in the last 168 hours Cardiac Enzymes: No results found for this basename: CKTOTAL, CKMB, CKMBINDEX, TROPONINI,  in the last 168 hours  BNP (last 3 results) No results found for this basename: PROBNP,  in the last 8760 hours CBG: No results found for this basename: GLUCAP,  in the last 168 hours  Radiological Exams on Admission: No results found.  EKG: None  Assessment/Plan  Dyspnea secondary to COPD exacerbation Patient directed from her primary physician's office. Patient admitted to  medical floor. Possibly secondary to pneumonia versus upper respiratory infection.  Will obtain chest x-ray to rule out pneumonia. Will place patient on empiric antibiotics with azithromycin and ceftriaxone, DuoNeb treatments, Solu-Medrol and Mucinex. Will obtain a CBC as well as a CMP, and EKG.  Will obtain urine Legionella and strep pneumonia antigen. Will obtain a sputum culture and Gram stain if possible.   History of renal cell carcinoma Patient had a right nephrectomy. Will avoid nephrotoxic agents.  Hypertension Continue lisinopril.  Hyperlipidemia Continue statin.  Tobacco abuse Recently quit smoking 3 days ago. Will place patient on nicotine patch  Depression/anxiety Patient  recently lost her husband 2 weeks ago unexpectedly. Continue Xanax.  DVT prophylaxis: SCDs  Code Status: Full  Condition: Guarded  Family Communication: Family at bedside. Admission, patients condition and plan of care including tests being ordered have been discussed with the patient and family who indicate understanding and agree with the plan and Code Status.  Disposition Plan: Admitted  Time spent: 60 minutes  Shaquitta Burbridge D.O. [facility] Pager [phone]  If 7PM-7AM, please contact night-coverage [email or web address] Password [facility] 01/17/2014, 12:35 PM

## 2014-01-17 NOTE — Progress Notes (Addendum)
Subjective:    Katie [name] is a 68 y.o. female with known COPD who presents with exacerbation. She is accompanied by her son today. Current symptoms include: acute dyspnea, non-productive cough, wheezing and tachypneic, Headache, backache, fatigue, weak, chills. Dyspnea began 2 days ago, weakness & fatigue for about 1 week.  She has been using albuterol nebulizer twice daily and mucinex once daily with no relief of cough or SOB. She is a smoker-placed nicotine patch on 3 days ago-has not had cigarette in 3 days due to coughing & SOB. Patient currently is not on home oxygen therapy. Other family family members have had viral illness with nasal congestion. Of note, pt is grieving recent sudden unexpected loss of husband.    The following portions of the patient's history were reviewed and updated as appropriate: allergies, current medications, past medical history, past social history, past surgical history and problem list.  Review of Systems Constitutional: positive for chills, fatigue and weight loss Ears, nose, mouth, throat, and face: positive for nasal congestion Respiratory: positive for cough, wheezing and dyspnea at rest, CPOD, smoker Cardiovascular: negative for chest pressure/discomfort, irregular heart beat, lower extremity edema and mitral regurg Genitourinary:Hx renal cell CA, R nephrectomy 2011 Musculoskeletal:positive for back pain Behavioral/Psych: positive for sleep disturbance, tobacco use and grieving-lost husband 2 weeks unexpectedly     Objective:    BP 154/99  Pulse 95  Temp(Src) 98 F (36.7 C) (Oral)  Resp 20  Ht 5\' 1"  (1.549 m)  Wt 147 lb (66.679 kg)  BMI 27.79 kg/m2  SpO2 100% Oxygen saturation 98% on room air BP 154/99  Pulse 95  Temp(Src) 98 F (36.7 C) (Oral)  Resp 20  Ht 5\' 1"  (1.549 m)  Wt 147 lb (66.679 kg)  BMI 27.79 kg/m2  SpO2 100% General appearance: alert, cooperative, appears stated age, fatigued and moderate distress Head: Normocephalic,  without obvious abnormality, atraumatic Eyes: negative findings: lids and lashes normal and conjunctivae and sclerae normal Ears: bilat effisions, TM nearly opaque-difficult to visualize bones Throat: lips, mucosa, and tongue normal; teeth and gums normal and Upper dentures in place Neck: no adenopathy, supple, symmetrical, trachea midline and thyroid not enlarged, symmetric, no tenderness/mass/nodules Lungs: diminished breath sounds bilaterally and throughout, rales bibasilar and bilaterally, rhonchi bibasilar and bilaterally, wheezes bilaterally and throughout and RR 24-30 Unable to speak in complete sentences. Heart: regular rate and rhythm, S1, S2 normal, no murmur, click, rub or gallop Extremities: extremities normal, atraumatic, no cyanosis or edema Pulses: 2+ and symmetric Skin: Skin color, texture, turgor normal. No rashes or lesions     Assessment:    Acute exacerbation of chronic bronchitis    Plan:    All questions answered. Neurosurgeon distributed. Admit to hospital.

## 2014-01-17 NOTE — Progress Notes (Signed)
Sputum collection device placed in room, labelled and explained to pt.

## 2014-01-17 NOTE — Progress Notes (Signed)
Katie [name] [id number] Code Status: Full   Admission Data: 01/17/2014 12:01 PM Attending Provider:  Ree [name] LOV:FIEPPI, Katie Manly, PA-C Consults/ Treatment Team:    Katie [name] is a 68 y.o. female patient admitted from ED awake, alert - oriented  X 3 - no acute distress noted.  VSS - There were no vitals taken for this visit.  no c/o shortness of breath, no c/o chest pain.    Allergies:  No Known Allergies   Past Medical History  Diagnosis Date  . Arthritis   . COPD (chronic obstructive pulmonary disease)     no history of PFTs, only diagnosed by CXR, not on any inhalers because she has not had a PCP in over 2 years  . Hypertension     previously on Lisinopril/HCTZ  . History of heartburn   . Hypercholesteremia     previously on pravastatin   . Cholelithiasis 2011    s/p cholescystectomy   . Renal cell carcinoma 01/2010    s/p right radical nephrectomy 01/2010,  followed [facility]  . Murmur     no  prior work-up  . GERD (gastroesophageal reflux disease)   . Emphysema 2010    dx'd/CXR  . Shortness of breath 09/26/11    "lately all the time"  . Pneumonia 1980's    "walking"   Medications Prior to Admission  Medication Sig Dispense Refill  . albuterol (PROVENTIL HFA;VENTOLIN HFA) 108 (90 BASE) MCG/ACT inhaler Inhale 2 puffs into the lungs every 4 (four) hours as needed for wheezing.  1 Inhaler  0  . albuterol (PROVENTIL) (2.5 MG/3ML) 0.083% nebulizer solution Take 3 mLs (2.5 mg total) by nebulization every 6 (six) hours as needed for wheezing.  75 mL  12  . ALPRAZolam (XANAX) 1 MG tablet Take 1 tablet (1 mg total) by mouth at bedtime.  30 tablet  0  . atorvastatin (LIPITOR) 20 MG tablet Take 1 tablet (20 mg total) by mouth daily.  90 tablet  3  . Fluticasone-Salmeterol (ADVAIR) 250-50 MCG/DOSE AEPB Inhale 1 puff into the lungs every 12 (twelve) hours.  60 each  3  . ibuprofen (ADVIL,MOTRIN) 200 MG tablet Take 400 mg by mouth every 6 (six) hours as needed.  For pain        . lisinopril (PRINIVIL,ZESTRIL) 10 MG tablet Take 1 tablet (10 mg total) by mouth daily.  90 tablet  3  . zolpidem (AMBIEN) 5 MG tablet Take 1 tablet (5 mg total) by mouth at bedtime as needed for sleep.  30 tablet  0   History:  obtained from the patient. Tobacco/alcohol: Smoked 1 packs per day for 50 years none  Orientation to room, and floor completed with information packet given to patient/family.  Patient watched safety video at this time.  Admission INP armband ID verified with patient/family, and in place.   SR up x 2, fall assessment complete, with patient and family able to verbalize understanding of risk associated with falls, and verbalized understanding to call nsg before up out of bed.  Call light within reach, patient able to voice, and demonstrate understanding.       Will cont to eval and treat per MD orders.  Katie [name], [place] 01/17/2014 12:01 PM

## 2014-01-17 NOTE — Progress Notes (Signed)
Pt c/o burning iv site while administering Zithromax IV. IV flushed and pt states that it felt better. Pharmacy called to see if dose could infuse at a slower rate and per pharmacy, rate change to go half as slow. Pt able to tolerate better. Will continue to monitor.

## 2014-01-18 ENCOUNTER — Inpatient Hospital Stay (HOSPITAL_COMMUNITY): Payer: Medicare Other

## 2014-01-18 ENCOUNTER — Telehealth: Payer: Self-pay | Admitting: Physician Assistant

## 2014-01-18 DIAGNOSIS — J441 Chronic obstructive pulmonary disease with (acute) exacerbation: Secondary | ICD-10-CM | POA: Diagnosis not present

## 2014-01-18 DIAGNOSIS — K219 Gastro-esophageal reflux disease without esophagitis: Secondary | ICD-10-CM | POA: Diagnosis not present

## 2014-01-18 DIAGNOSIS — J449 Chronic obstructive pulmonary disease, unspecified: Secondary | ICD-10-CM | POA: Diagnosis not present

## 2014-01-18 DIAGNOSIS — G4700 Insomnia, unspecified: Secondary | ICD-10-CM

## 2014-01-18 DIAGNOSIS — I10 Essential (primary) hypertension: Secondary | ICD-10-CM | POA: Diagnosis not present

## 2014-01-18 DIAGNOSIS — R05 Cough: Secondary | ICD-10-CM | POA: Diagnosis not present

## 2014-01-18 LAB — CBC
HCT: 34.8 % — ABNORMAL LOW (ref 36.0–46.0)
Hemoglobin: 11.5 g/dL — ABNORMAL LOW (ref 12.0–15.0)
MCH: 29.8 pg (ref 26.0–34.0)
MCHC: 33 g/dL (ref 30.0–36.0)
MCV: 90.2 fL (ref 78.0–100.0)
PLATELETS: 225 10*3/uL (ref 150–400)
RBC: 3.86 MIL/uL — ABNORMAL LOW (ref 3.87–5.11)
RDW: 12.8 % (ref 11.5–15.5)
WBC: 6 10*3/uL (ref 4.0–10.5)

## 2014-01-18 LAB — LEGIONELLA ANTIGEN, URINE: Legionella Antigen, Urine: NEGATIVE

## 2014-01-18 LAB — STREP PNEUMONIAE URINARY ANTIGEN: Strep Pneumo Urinary Antigen: NEGATIVE

## 2014-01-18 LAB — BASIC METABOLIC PANEL
BUN: 17 mg/dL (ref 6–23)
CALCIUM: 9.6 mg/dL (ref 8.4–10.5)
CHLORIDE: 106 meq/L (ref 96–112)
CO2: 20 meq/L (ref 19–32)
CREATININE: 1.27 mg/dL — AB (ref 0.50–1.10)
GFR calc Af Amer: 50 mL/min — ABNORMAL LOW (ref >90)
GFR calc non Af Amer: 43 mL/min — ABNORMAL LOW (ref >90)
GLUCOSE: 143 mg/dL — AB (ref 70–99)
Potassium: 4.1 mEq/L (ref 3.7–5.3)
Sodium: 144 mEq/L (ref 137–147)

## 2014-01-18 MED ORDER — IPRATROPIUM-ALBUTEROL 0.5-2.5 (3) MG/3ML IN SOLN
3.0000 mL | Freq: Four times a day (QID) | RESPIRATORY_TRACT | Status: DC
  Administered 2014-01-18 – 2014-01-19 (×5): 3 mL via RESPIRATORY_TRACT
  Filled 2014-01-18 (×5): qty 3

## 2014-01-18 NOTE — Care Management Note (Signed)
    Page 1 of 1   01/19/2014     11:33:44 AM   CARE MANAGEMENT NOTE 01/19/2014  Patient:  Katie [name], Katie [name]   Account Number:  [id number]  Date Initiated:  01/18/2014  Documentation initiated by:  Tomi Bamberger  Subjective/Objective Assessment:   dx copd ex  admit- lives alone  pta indep.     Action/Plan:   Anticipated DC Date:  01/19/2014   Anticipated DC Plan:  [place]  CM consult      Choice offered to / List presented to:             Status of service:  Completed, signed off Medicare Important Message given?   (If response is "NO", the following Medicare IM given date fields will be blank) Date Medicare IM given:   Date Additional Medicare IM given:    Discharge Disposition:  HOME/SELF CARE  Per UR Regulation:  Reviewed for med. necessity/level of care/duration of stay  If discussed at [place] of Stay Meetings, dates discussed:    Comments:

## 2014-01-18 NOTE — Telephone Encounter (Signed)
Relevant patient education mailed to patient.  

## 2014-01-18 NOTE — Progress Notes (Signed)
[facility] PROGRESS NOTE   Katie [name] IRS:[id number] DOB: September 27, 1946 DOA: 01/17/2014 PCP: [name], PA-C  HPI/Subjective: Has some shortness of breath but reported improvement since yesterday  Assessment/Plan: Principal Problem:   COPD exacerbation Active Problems:   HTN (hypertension)   HLD (hyperlipidemia)    COPD exacerbation -Started on antibiotics, steroids and bronchodilators. -Supportive management with mucolytics, and ptosis and oxygen as needed. -Chest x-ray showed no infiltrates. -Likely can be discharged in one to 2 days.  Hypertension -Continue home medications, this is reasonably controlled.  Tobacco abuse -Patient reported that she quit smoking about 3-4 days ago. -Counseled extensively to continue quitting tobacco products.  Code Status: Full code Family Communication: Plan discussed with the patient. Disposition Plan: Remains inpatient   Consultants:  None  Procedures:  None  Antibiotics:  Rocephin and azithromycin.   Objective: Filed Vitals:   01/18/14 0952  BP: 122/76  Pulse:   Temp:   Resp:     Intake/Output Summary (Last 24 hours) at 01/18/14 1652 Last data filed at 01/17/14 1827  Gross per 24 hour  Intake  527.5 ml  Output      0 ml  Net  527.5 ml   Filed Weights   01/17/14 1205  Weight: 66.679 kg (147 lb)    Exam: General: Alert and awake, oriented x3, not in any acute distress. HEENT: anicteric sclera, pupils reactive to light and accommodation, EOMI CVS: S1-S2 clear, no murmur rubs or gallops Chest: clear to auscultation bilaterally, no wheezing, rales or rhonchi Abdomen: soft nontender, nondistended, normal bowel sounds, no organomegaly Extremities: no cyanosis, clubbing or edema noted bilaterally Neuro: Cranial nerves II-XII intact, no focal neurological deficits  Data Reviewed: Basic Metabolic Panel:  Recent Labs Lab 01/17/14 1255 01/18/14 0650  NA 142 144  K 3.7 4.1  CL 103 106   CO2 24 20  GLUCOSE 98 143*  BUN 16 17  CREATININE 1.34* 1.27*  CALCIUM 9.8 9.6   Liver Function Tests:  Recent Labs Lab 01/17/14 1255  AST 17  ALT 11  ALKPHOS 83  BILITOT <0.2*  PROT 6.8  ALBUMIN 3.2*   No results found for this basename: LIPASE, AMYLASE,  in the last 168 hours No results found for this basename: AMMONIA,  in the last 168 hours CBC:  Recent Labs Lab 01/17/14 1255 01/18/14 0650  WBC 5.2 6.0  NEUTROABS 3.8  --   HGB 11.1* 11.5*  HCT 33.4* 34.8*  MCV 89.1 90.2  PLT 230 225   Cardiac Enzymes: No results found for this basename: CKTOTAL, CKMB, CKMBINDEX, TROPONINI,  in the last 168 hours BNP (last 3 results) No results found for this basename: PROBNP,  in the last 8760 hours CBG: No results found for this basename: GLUCAP,  in the last 168 hours  Micro Recent Results (from the past 240 hour(s))  CULTURE, BLOOD (ROUTINE X 2)     Status: None   Collection Time    01/17/14  1:00 PM      Result Value Ref Range Status   Specimen Description BLOOD RIGHT ANTECUBITAL   Final   Special Requests BOTTLES DRAWN AEROBIC AND ANAEROBIC 10CC   Final   Culture  Setup Time     Final   Value: 01/17/2014 17:05     Performed at [facility]   Culture     Final   Value:        BLOOD CULTURE RECEIVED NO GROWTH TO DATE CULTURE WILL BE HELD FOR 5  DAYS BEFORE ISSUING A FINAL NEGATIVE REPORT     Performed at [facility]   Report Status PENDING   Incomplete  CULTURE, BLOOD (ROUTINE X 2)     Status: None   Collection Time    01/17/14  1:10 PM      Result Value Ref Range Status   Specimen Description BLOOD RIGHT HAND   Final   Special Requests BOTTLES DRAWN AEROBIC ONLY 3CC   Final   Culture  Setup Time     Final   Value: 01/17/2014 17:05     Performed at [facility]   Culture     Final   Value:        BLOOD CULTURE RECEIVED NO GROWTH TO DATE CULTURE WILL BE HELD FOR 5 DAYS BEFORE ISSUING A FINAL NEGATIVE REPORT     Performed at [facility]   Report Status PENDING   Incomplete     Studies: Dg Chest 2 View  01/18/2014   CLINICAL DATA:  Cough and fever  EXAM: CHEST  2 VIEW  COMPARISON:  07/12/2013  FINDINGS: Cardiac shadow is stable. The lungs are well aerated without focal infiltrate or sizable effusion. No acute bony abnormality is seen.  IMPRESSION: No active cardiopulmonary disease.   Electronically Signed   By: Inez Catalina M.D.   On: 01/18/2014 07:51    Scheduled Meds: . ALPRAZolam  1 mg Oral QHS  . atorvastatin  20 mg Oral q1800  . azithromycin  500 mg Intravenous Q24H  . cefTRIAXone (ROCEPHIN)  IV  1 g Intravenous Q24H  . guaiFENesin  1,200 mg Oral BID  . ipratropium-albuterol  3 mL Nebulization QID  . lisinopril  10 mg Oral Daily  . methylPREDNISolone (SOLU-MEDROL) injection  60 mg Intravenous Q12H  . mometasone-formoterol  2 puff Inhalation BID  . nicotine  21 mg Transdermal Q24H   Continuous Infusions:      Time spent: 35 minutes    [facility]  [facility] Pager [phone] If 7PM-7AM, please contact night-coverage at [email or web address], password [facility] 01/18/2014, 4:52 PM  LOS: 1 day

## 2014-01-19 DIAGNOSIS — I10 Essential (primary) hypertension: Secondary | ICD-10-CM | POA: Diagnosis not present

## 2014-01-19 DIAGNOSIS — G4700 Insomnia, unspecified: Secondary | ICD-10-CM | POA: Diagnosis not present

## 2014-01-19 DIAGNOSIS — J449 Chronic obstructive pulmonary disease, unspecified: Secondary | ICD-10-CM | POA: Diagnosis not present

## 2014-01-19 DIAGNOSIS — K219 Gastro-esophageal reflux disease without esophagitis: Secondary | ICD-10-CM | POA: Diagnosis not present

## 2014-01-19 MED ORDER — ALBUTEROL SULFATE (2.5 MG/3ML) 0.083% IN NEBU: 2.5000 mg | INHALATION_SOLUTION | Freq: Four times a day (QID) | RESPIRATORY_TRACT | Status: DC | PRN

## 2014-01-19 MED ORDER — ZOLPIDEM TARTRATE 5 MG PO TABS: 5.0000 mg | ORAL_TABLET | Freq: Every evening | ORAL | Status: DC | PRN

## 2014-01-19 MED ORDER — MOMETASONE FURO-FORMOTEROL FUM 100-5 MCG/ACT IN AERO: 2.0000 | INHALATION_SPRAY | Freq: Two times a day (BID) | RESPIRATORY_TRACT | Status: DC

## 2014-01-19 MED ORDER — PREDNISONE 10 MG PO TABS: 60.0000 mg | ORAL_TABLET | Freq: Every day | ORAL | Status: DC

## 2014-01-19 MED ORDER — AZITHROMYCIN 500 MG PO TABS: 500.0000 mg | ORAL_TABLET | Freq: Every day | ORAL | Status: DC

## 2014-01-19 NOTE — Discharge Summary (Signed)
Physician Discharge Summary  Katie [name] ZOX:[id number] DOB: 03/07/1946 DOA: 01/17/2014  PCP: [name], PA-C  Admit date: 01/17/2014 Discharge date: 01/19/2014  Time spent: 40 minutes  Recommendations for Outpatient Follow-up:  1. Follow up with PCP regarding pulmonary status and renal function.   BMET in 1 week  Discharge Diagnoses:  Principal Problem:   COPD exacerbation Active Problems:   HTN (hypertension)   HLD (hyperlipidemia)   Discharge Condition: stable.  No respiratory distress  Diet recommendation: regular diet.  Filed Weights   01/17/14 1205  Weight: 66.679 kg (147 lb)    History of present illness:    Hospital Course:   COPD exacerbation  -Started on antibiotics, steroids and bronchodilators. Improved over the course of 48 hours. -Supportive management with mucolytics. -Chest x-ray showed no infiltrates.  -Follow up with primary care physician. -Will be discharged on nebs, dulera, prednisone, and azithromycin.  Hypertension  -Continue home medications, this is reasonably controlled.   Tobacco abuse  -Patient reported that she quit smoking about 3-4 days ago.  -Counseled extensively to continue quitting tobacco products.  Reports she has nicotine patches at home.  Hx of Renal Cell Carcinoma -Creatinine appears at baseline.   Discharge Exam: Filed Vitals:   01/19/14 0457  BP: 128/77  Pulse: 89  Temp: 97.7 F (36.5 C)  Resp: 18   General: Alert and awake, oriented x3, not in any acute distress.  HEENT: anicteric sclera, pupils reactive to light and accommodation, EOMI  CVS: S1-S2 clear, no murmur rubs or gallops  Chest: mild wheeze bilaterally.  No accessory muscle use.  +cough Abdomen: soft nontender, nondistended, normal bowel sounds, no organomegaly  Extremities: no cyanosis, clubbing or edema noted bilaterally  Neuro: Cranial nerves II-XII intact, no focal neurological deficits    Discharge Instructions      Discharge  Orders   Future Orders Complete By Expires   Diet - low sodium heart healthy  As directed    Increase activity slowly  As directed        Medication List    STOP taking these medications       Fluticasone-Salmeterol 250-50 MCG/DOSE Aepb  Commonly known as:  ADVAIR  Replaced by:  mometasone-formoterol 100-5 MCG/ACT Aero     ibuprofen 200 MG tablet  Commonly known as:  ADVIL,MOTRIN      TAKE these medications       albuterol (2.5 MG/3ML) 0.083% nebulizer solution  Commonly known as:  PROVENTIL  Take 3 mLs (2.5 mg total) by nebulization every 6 (six) hours as needed for wheezing.     ALPRAZolam 1 MG tablet  Commonly known as:  XANAX  Take 1 tablet (1 mg total) by mouth at bedtime.     atorvastatin 20 MG tablet  Commonly known as:  LIPITOR  Take 1 tablet (20 mg total) by mouth daily.     azithromycin 500 MG tablet  Commonly known as:  ZITHROMAX  Take 1 tablet (500 mg total) by mouth daily.     lisinopril 10 MG tablet  Commonly known as:  PRINIVIL,ZESTRIL  Take 1 tablet (10 mg total) by mouth daily.     mometasone-formoterol 100-5 MCG/ACT Aero  Commonly known as:  DULERA  Inhale 2 puffs into the lungs 2 (two) times daily.     predniSONE 10 MG tablet  Commonly known as:  DELTASONE  Take 6 tablets (60 mg total) by mouth daily with breakfast. Taper 6 tabs on 4/3 then 5 tabs on 4/4, then decrease  by one tab daily until gone.     zolpidem 5 MG tablet  Commonly known as:  AMBIEN  Take 1 tablet (5 mg total) by mouth at bedtime as needed for sleep.       No Known Allergies Follow-up Information   Follow up with [name], PA-C In 1 week.   Specialty:  Physician Assistant   Contact information:   [place] [address] [phone]        The results of significant diagnostics from this hospitalization (including imaging, microbiology, ancillary and laboratory) are listed below for reference.    Significant Diagnostic Studies: Dg  Chest 2 View  01/18/2014   CLINICAL DATA:  Cough and fever  EXAM: CHEST  2 VIEW  COMPARISON:  07/12/2013  FINDINGS: Cardiac shadow is stable. The lungs are well aerated without focal infiltrate or sizable effusion. No acute bony abnormality is seen.  IMPRESSION: No active cardiopulmonary disease.   Electronically Signed   By: Inez Catalina M.D.   On: 01/18/2014 07:51    Microbiology: Recent Results (from the past 240 hour(s))  CULTURE, BLOOD (ROUTINE X 2)     Status: None   Collection Time    01/17/14  1:00 PM      Result Value Ref Range Status   Specimen Description BLOOD RIGHT ANTECUBITAL   Final   Special Requests BOTTLES DRAWN AEROBIC AND ANAEROBIC 10CC   Final   Culture  Setup Time     Final   Value: 01/17/2014 17:05     Performed at [facility]   Culture     Final   Value:        BLOOD CULTURE RECEIVED NO GROWTH TO DATE CULTURE WILL BE HELD FOR 5 DAYS BEFORE ISSUING A FINAL NEGATIVE REPORT     Performed at [facility]   Report Status PENDING   Incomplete  CULTURE, BLOOD (ROUTINE X 2)     Status: None   Collection Time    01/17/14  1:10 PM      Result Value Ref Range Status   Specimen Description BLOOD RIGHT HAND   Final   Special Requests BOTTLES DRAWN AEROBIC ONLY 3CC   Final   Culture  Setup Time     Final   Value: 01/17/2014 17:05     Performed at [facility]   Culture     Final   Value:        BLOOD CULTURE RECEIVED NO GROWTH TO DATE CULTURE WILL BE HELD FOR 5 DAYS BEFORE ISSUING A FINAL NEGATIVE REPORT     Performed at [facility]   Report Status PENDING   Incomplete     Labs: Basic Metabolic Panel:  Recent Labs Lab 01/17/14 1255 01/18/14 0650  NA 142 144  K 3.7 4.1  CL 103 106  CO2 24 20  GLUCOSE 98 143*  BUN 16 17  CREATININE 1.34* 1.27*  CALCIUM 9.8 9.6   Liver Function Tests:  Recent Labs Lab 01/17/14 1255  AST 17  ALT 11  ALKPHOS 83  BILITOT <0.2*  PROT 6.8  ALBUMIN 3.2*   CBC:  Recent Labs Lab  01/17/14 1255 01/18/14 0650  WBC 5.2 6.0  NEUTROABS 3.8  --   HGB 11.1* 11.5*  HCT 33.4* 34.8*  MCV 89.1 90.2  PLT 230 225    SignedKaren Kitchens [phone]  [facility] 01/19/2014, 4:41 PM

## 2014-01-19 NOTE — Progress Notes (Signed)
Pt with discharge orders to home. Discharge orders reviewed with patient. PIV removed and intact. Pt skin intact upon discharge. Pt verbalizes understanding of all discharge teaching with no further questions. Pt stable upon discharge.

## 2014-01-20 NOTE — Discharge Summary (Signed)
Addendum  Patient seen and examined, chart and data base reviewed.  I agree with the above assessment and plan.  For full details please see Mrs. Imogene Burn PA note.  COPD exacerbation improving discharged home on prednisone and Z-Pak.   Birdie Hopes, MD [facility] Pager: [phone] 01/20/2014, 4:46 PM

## 2014-01-23 LAB — CULTURE, BLOOD (ROUTINE X 2)
CULTURE: NO GROWTH
CULTURE: NO GROWTH

## 2014-01-25 ENCOUNTER — Encounter: Payer: Self-pay | Admitting: Physician Assistant

## 2014-01-25 ENCOUNTER — Ambulatory Visit (INDEPENDENT_AMBULATORY_CARE_PROVIDER_SITE_OTHER): Payer: Medicare Other | Admitting: Physician Assistant

## 2014-01-25 VITALS — BP 116/74 | HR 84 | Temp 98.2°F | Resp 16 | Ht 61.0 in | Wt 149.2 lb

## 2014-01-25 DIAGNOSIS — E559 Vitamin D deficiency, unspecified: Secondary | ICD-10-CM | POA: Diagnosis not present

## 2014-01-25 DIAGNOSIS — J441 Chronic obstructive pulmonary disease with (acute) exacerbation: Secondary | ICD-10-CM

## 2014-01-25 DIAGNOSIS — R5381 Other malaise: Secondary | ICD-10-CM

## 2014-01-25 DIAGNOSIS — R5383 Other fatigue: Secondary | ICD-10-CM | POA: Diagnosis not present

## 2014-01-25 DIAGNOSIS — I10 Essential (primary) hypertension: Secondary | ICD-10-CM | POA: Diagnosis not present

## 2014-01-25 LAB — CBC WITH DIFFERENTIAL/PLATELET
BASOS ABS: 0 10*3/uL (ref 0.0–0.1)
BASOS PCT: 0 % (ref 0–1)
Eosinophils Absolute: 0 10*3/uL (ref 0.0–0.7)
Eosinophils Relative: 0 % (ref 0–5)
HCT: 34.1 % — ABNORMAL LOW (ref 36.0–46.0)
HEMOGLOBIN: 11.7 g/dL — AB (ref 12.0–15.0)
Lymphocytes Relative: 13 % (ref 12–46)
Lymphs Abs: 1.2 10*3/uL (ref 0.7–4.0)
MCH: 29.1 pg (ref 26.0–34.0)
MCHC: 34.3 g/dL (ref 30.0–36.0)
MCV: 84.8 fL (ref 78.0–100.0)
MONO ABS: 0.5 10*3/uL (ref 0.1–1.0)
Monocytes Relative: 5 % (ref 3–12)
NEUTROS PCT: 82 % — AB (ref 43–77)
Neutro Abs: 7.7 10*3/uL (ref 1.7–7.7)
Platelets: 380 10*3/uL (ref 150–400)
RBC: 4.02 MIL/uL (ref 3.87–5.11)
RDW: 13.6 % (ref 11.5–15.5)
WBC: 9.4 10*3/uL (ref 4.0–10.5)

## 2014-01-25 MED ORDER — HYDROCOD POLST-CHLORPHEN POLST 10-8 MG/5ML PO LQCR: 5.0000 mL | Freq: Two times a day (BID) | ORAL | Status: DC | PRN

## 2014-01-25 NOTE — Progress Notes (Signed)
Pre visit review using our clinic review tool, if applicable. No additional management support is needed unless otherwise documented below in the visit note/SLS  

## 2014-01-25 NOTE — Progress Notes (Signed)
Patient presents to clinic today c/o elevated BP with lightheadedness and blurry vision, experienced early this AM.  States BP was in 160s/80s when checked earlier.  Patient stated episode began after taking her prednisone this am.  Patient recently hospitalized for COPD exacerbation.  Is finishing steroid taper.  Denies shortness of breath, chest pain, palpitations.  Denies LH, dizziness or blurry vision since this AM.  Patient had recent EKG on 01/17/14 revealing NSR with rate of 99 beats per minute.  Past Medical History  Diagnosis Date  . Arthritis   . COPD (chronic obstructive pulmonary disease)     no history of PFTs, only diagnosed by CXR, not on any inhalers because she has not had a PCP in over 2 years  . Hypertension     previously on Lisinopril/HCTZ  . History of heartburn   . Hypercholesteremia     previously on pravastatin   . Cholelithiasis 2011    s/p cholescystectomy   . Renal cell carcinoma 01/2010    s/p right radical nephrectomy 01/2010,  followed by [facility]  . Murmur     no  prior work-up  . GERD (gastroesophageal reflux disease)   . Emphysema 2010    dx'd/CXR  . Shortness of breath 09/26/11    "lately all the time"  . Pneumonia 1980's    "walking"    Current Outpatient Prescriptions on File Prior to Visit  Medication Sig Dispense Refill  . lisinopril (PRINIVIL,ZESTRIL) 10 MG tablet Take 1 tablet (10 mg total) by mouth daily.  90 tablet  3  . mometasone-formoterol (DULERA) 100-5 MCG/ACT AERO Inhale 2 puffs into the lungs 2 (two) times daily.  1 Inhaler  6  . predniSONE (DELTASONE) 10 MG tablet Take 6 tablets (60 mg total) by mouth daily with breakfast. Taper 6 tabs on 4/3 then 5 tabs on 4/4, then decrease by one tab daily until gone.  40 tablet  0  . zolpidem (AMBIEN) 5 MG tablet Take 1 tablet (5 mg total) by mouth at bedtime as needed for sleep.  30 tablet  0  . atorvastatin (LIPITOR) 20 MG tablet Take 1 tablet (20 mg total) by mouth daily.  90 tablet  3    No current facility-administered medications on file prior to visit.   No Known Allergies  Family History  Problem Relation Age of Onset  . COPD Mother     DECEASED    History   Social History  . Marital Status: Married    Spouse Name: N/A    Number of Children: 3  . Years of Education: 12   Occupational History  . Multimedia programmer business   Social History Main Topics  . Smoking status: Former Smoker -- 1.00 packs/day for 50 years    Types: Cigarettes    Quit date: 01/14/2014  . Smokeless tobacco: Never Used     Comment: smoking cessation consult entered  . Alcohol Use: Yes     Comment:  drink very rarely"  . Drug Use: No     Comment: hx of cocaine last in 2006  . Sexual Activity: No   Other Topics Concern  . None   Social History Narrative   Lives in [place] with her husband.    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    Patient has 3 grown children that do not live with her.    Patient does not have any medical insurance.  Review of Systems - See HPI.  All other ROS are negative.  BP 116/74  Pulse 84  Temp(Src) 98.2 F (36.8 C) (Oral)  Resp 16  Ht $R'5\' 1"'Tv$  (1.549 m)  Wt 149 lb 4 oz (67.699 kg)  BMI 28.21 kg/m2  SpO2 97%  Physical Exam  Vitals reviewed. Constitutional: She is oriented to person, place, and time and well-developed, well-nourished, and in no distress.  HENT:  Head: Normocephalic and atraumatic.  Right Ear: External ear normal.  Left Ear: External ear normal.  Nose: Nose normal.  Mouth/Throat: Oropharynx is clear and moist. No oropharyngeal exudate.  Eyes: Conjunctivae are normal. Pupils are equal, round, and reactive to light.  Neck: Neck supple.  Cardiovascular: Normal rate, regular rhythm, normal heart sounds and intact distal pulses.   Pulmonary/Chest: Effort normal and breath sounds normal. No respiratory distress. She has no wheezes. She has no rales. She exhibits no  tenderness.  Neurological: She is alert and oriented to person, place, and time.  Skin: Skin is warm and dry. No rash noted.  Psychiatric: Affect normal.   Recent Results (from the past 2160 hour(s))  HIV ANTIBODY (ROUTINE TESTING)     Status: None   Collection Time    01/17/14 12:55 PM      Result Value Ref Range   HIV NON REACTIVE  NON REACTIVE   Comment: (NOTE)     Effective January 23, 2014, [facility] will no longer offer the     current 3rd Generation HIV diagnostic screening assay, HIV Antibodies,     HIV-1/2 EIA, with reflexes. At that time, [facility] will     only offer HIV-1/2 Ag/Ab, 4th Gen, w/ Reflexes as recommended by the     CDC. This HIV diagnostic screening assay tests for antibodies to HIV-1     and HIV-2 as well as HIV p24 antigen and provides greater sensitivity     for the detection of recent infection. Any orders for the 3rd     Generation assay will automatically be referred to the 4th Generation     assay.     Performed at [facility]     Status: Abnormal   Collection Time    01/17/14 12:55 PM      Result Value Ref Range   Sodium 142  137 - 147 mEq/L   Potassium 3.7  3.7 - 5.3 mEq/L   Chloride 103  96 - 112 mEq/L   CO2 24  19 - 32 mEq/L   Glucose, Bld 98  70 - 99 mg/dL   BUN 16  6 - 23 mg/dL   Creatinine, Ser 1.34 (*) 0.50 - 1.10 mg/dL   Calcium 9.8  8.4 - 10.5 mg/dL   Total Protein 6.8  6.0 - 8.3 g/dL   Albumin 3.2 (*) 3.5 - 5.2 g/dL   AST 17  0 - 37 U/L   ALT 11  0 - 35 U/L   Alkaline Phosphatase 83  39 - 117 U/L   Total Bilirubin <0.2 (*) 0.3 - 1.2 mg/dL   GFR calc non Af Amer 40 (*) >90 mL/min   GFR calc Af Amer 47 (*) >90 mL/min   Comment: (NOTE)     The eGFR has been calculated using the CKD EPI equation.     This calculation has not been validated in all clinical situations.     eGFR's persistently <90 mL/min signify possible Chronic Kidney     Disease.  CBC WITH DIFFERENTIAL      Status: Abnormal   Collection Time    01/17/14 12:55 PM      Result Value Ref Range   WBC 5.2  4.0 - 10.5 K/uL   RBC 3.75 (*) 3.87 - 5.11 MIL/uL   Hemoglobin 11.1 (*) 12.0 - 15.0 g/dL   HCT 31.9 (*) 24.3 - 83.6 %   MCV 89.1  78.0 - 100.0 fL   MCH 29.6  26.0 - 34.0 pg   MCHC 33.2  30.0 - 36.0 g/dL   RDW 54.2  71.5 - 66.4 %   Platelets 230  150 - 400 K/uL   Neutrophils Relative % 73  43 - 77 %   Neutro Abs 3.8  1.7 - 7.7 K/uL   Lymphocytes Relative 16  12 - 46 %   Lymphs Abs 0.8  0.7 - 4.0 K/uL   Monocytes Relative 6  3 - 12 %   Monocytes Absolute 0.3  0.1 - 1.0 K/uL   Eosinophils Relative 4  0 - 5 %   Eosinophils Absolute 0.2  0.0 - 0.7 K/uL   Basophils Relative 1  0 - 1 %   Basophils Absolute 0.1  0.0 - 0.1 K/uL  CULTURE, BLOOD (ROUTINE X 2)     Status: None   Collection Time    01/17/14  1:00 PM      Result Value Ref Range   Specimen Description BLOOD RIGHT ANTECUBITAL     Special Requests BOTTLES DRAWN AEROBIC AND ANAEROBIC 10CC     Culture  Setup Time       Value: 01/17/2014 17:05     Performed at [facility]   Culture       Value: NO GROWTH 5 DAYS     Performed at [facility]   Report Status 01/23/2014 FINAL    CULTURE, BLOOD (ROUTINE X 2)     Status: None   Collection Time    01/17/14  1:10 PM      Result Value Ref Range   Specimen Description BLOOD RIGHT HAND     Special Requests BOTTLES DRAWN AEROBIC ONLY 3CC     Culture  Setup Time       Value: 01/17/2014 17:05     Performed at [facility]   Culture       Value: NO GROWTH 5 DAYS     Performed at [facility]   Report Status 01/23/2014 FINAL    LEGIONELLA ANTIGEN, URINE     Status: None   Collection Time    01/17/14  9:56 PM      Result Value Ref Range   Specimen Description URINE, RANDOM     Special Requests NONE     Legionella Antigen, Urine       Value: Negative for Legionella pneumophilia serogroup 1     Performed at [facility]   Report Status 01/18/2014  FINAL    STREP PNEUMONIAE URINARY ANTIGEN     Status: None   Collection Time    01/17/14  9:56 PM      Result Value Ref Range   Strep Pneumo Urinary Antigen NEGATIVE  NEGATIVE   Comment:            Infection due to S. pneumoniae     cannot be absolutely ruled out     since the antigen present     may be below the detection limit     of the test.  BASIC METABOLIC PANEL     Status: Abnormal   Collection Time    01/18/14  6:50 AM      Result Value Ref Range   Sodium 144  137 - 147 mEq/L   Potassium 4.1  3.7 - 5.3 mEq/L   Chloride 106  96 - 112 mEq/L   CO2 20  19 - 32 mEq/L   Glucose, Bld 143 (*) 70 - 99 mg/dL   BUN 17  6 - 23 mg/dL   Creatinine, Ser 1.27 (*) 0.50 - 1.10 mg/dL   Calcium 9.6  8.4 - 10.5 mg/dL   GFR calc non Af Amer 43 (*) >90 mL/min   GFR calc Af Amer 50 (*) >90 mL/min   Comment: (NOTE)     The eGFR has been calculated using the CKD EPI equation.     This calculation has not been validated in all clinical situations.     eGFR's persistently <90 mL/min signify possible Chronic Kidney     Disease.  CBC     Status: Abnormal   Collection Time    01/18/14  6:50 AM      Result Value Ref Range   WBC 6.0  4.0 - 10.5 K/uL   RBC 3.86 (*) 3.87 - 5.11 MIL/uL   Hemoglobin 11.5 (*) 12.0 - 15.0 g/dL   HCT 34.8 (*) 36.0 - 46.0 %   MCV 90.2  78.0 - 100.0 fL   MCH 29.8  26.0 - 34.0 pg   MCHC 33.0  30.0 - 36.0 g/dL   RDW 12.8  11.5 - 15.5 %   Platelets 225  150 - 400 K/uL  CBC WITH DIFFERENTIAL     Status: Abnormal   Collection Time    01/25/14  2:24 AM      Result Value Ref Range   WBC 9.4  4.0 - 10.5 K/uL   RBC 4.02  3.87 - 5.11 MIL/uL   Hemoglobin 11.7 (*) 12.0 - 15.0 g/dL   HCT 34.1 (*) 36.0 - 46.0 %   MCV 84.8  78.0 - 100.0 fL   MCH 29.1  26.0 - 34.0 pg   MCHC 34.3  30.0 - 36.0 g/dL   RDW 13.6  11.5 - 15.5 %   Platelets 380  150 - 400 K/uL   Neutrophils Relative % 82 (*) 43 - 77 %   Neutro Abs 7.7  1.7 - 7.7 K/uL   Lymphocytes Relative 13  12 - 46 %   Lymphs  Abs 1.2  0.7 - 4.0 K/uL   Monocytes Relative 5  3 - 12 %   Monocytes Absolute 0.5  0.1 - 1.0 K/uL   Eosinophils Relative 0  0 - 5 %   Eosinophils Absolute 0.0  0.0 - 0.7 K/uL   Basophils Relative 0  0 - 1 %   Basophils Absolute 0.0  0.0 - 0.1 K/uL   Smear Review Criteria for review not met    VITAMIN D 1,25 DIHYDROXY     Status: None   Collection Time    01/25/14  2:24 AM      Result Value Ref Range   Vitamin D 1, 25 (OH)2 Total       Vitamin D3 1, 25 (OH)2       Vitamin D2 1, 25 (OH)2        Assessment/Plan: COPD exacerbation Resolving.  Physical exam unremarkable.  Antibiotic course complete.  Will reduce tomorrow's dose of Prednisone to $RemoveBefor'10mg'[id number]$  giving her "reaction" after taking  medication today.  Will finish tomorrow's dosing before finishing course.    HTN (hypertension) BP normotensive in clinic.  Episode possibly due to side effect of steroid and anxiety.  Continue medications as directed.  Follow-up as scheduled.  Return to clinic sooner if episode recurs.

## 2014-01-25 NOTE — Patient Instructions (Addendum)
Please continue medications as directed.  For prednisone, I would attempt one more day of 1 tablet before stopping medication.  Continue Albuterol and Dulera.  Use Tussionex as directed for cough.    Please obtain labs.  I will call you with your results.  Please increase fluid intake.  Take a daily multivitamin and fiber supplement.  Continue other medications as directed. Monitor BP at home.  Call if symptoms recur.  You need to see an Optometrist.

## 2014-01-29 NOTE — Assessment & Plan Note (Addendum)
Resolving.  Physical exam unremarkable.  Antibiotic course complete.  Will reduce tomorrow's dose of Prednisone to 10mg  giving her "reaction" after taking medication today.  Will finish tomorrow's dosing before finishing course.  Will obtain CBC, BMP.

## 2014-01-29 NOTE — Assessment & Plan Note (Signed)
BP normotensive in clinic.  Episode possibly due to side effect of steroid and anxiety.  Continue medications as directed.  Follow-up as scheduled.  Return to clinic sooner if episode recurs.

## 2014-02-02 LAB — VITAMIN D 1,25 DIHYDROXY
Vitamin D 1, 25 (OH)2 Total: 64 pg/mL (ref 18–72)
Vitamin D2 1, 25 (OH)2: <8 pg/mL
Vitamin D3 1, 25 (OH)2: 64 pg/mL

## 2014-02-10 DIAGNOSIS — N2581 Secondary hyperparathyroidism of renal origin: Secondary | ICD-10-CM | POA: Diagnosis not present

## 2014-02-10 DIAGNOSIS — N183 Chronic kidney disease, stage 3 unspecified: Secondary | ICD-10-CM | POA: Diagnosis not present

## 2014-02-13 DIAGNOSIS — I129 Hypertensive chronic kidney disease with stage 1 through stage 4 chronic kidney disease, or unspecified chronic kidney disease: Secondary | ICD-10-CM | POA: Diagnosis not present

## 2014-02-13 DIAGNOSIS — N2581 Secondary hyperparathyroidism of renal origin: Secondary | ICD-10-CM | POA: Diagnosis not present

## 2014-02-13 DIAGNOSIS — D631 Anemia in chronic kidney disease: Secondary | ICD-10-CM | POA: Diagnosis not present

## 2014-02-13 DIAGNOSIS — N039 Chronic nephritic syndrome with unspecified morphologic changes: Secondary | ICD-10-CM | POA: Diagnosis not present

## 2014-02-13 DIAGNOSIS — N183 Chronic kidney disease, stage 3 unspecified: Secondary | ICD-10-CM | POA: Diagnosis not present

## 2014-06-12 ENCOUNTER — Encounter: Payer: Self-pay | Admitting: Medical

## 2014-06-12 ENCOUNTER — Ambulatory Visit (INDEPENDENT_AMBULATORY_CARE_PROVIDER_SITE_OTHER): Payer: Medicare Other | Admitting: Medical

## 2014-06-12 VITALS — BP 145/80 | HR 93 | Temp 97.9°F | Wt 143.0 lb

## 2014-06-12 DIAGNOSIS — J441 Chronic obstructive pulmonary disease with (acute) exacerbation: Secondary | ICD-10-CM

## 2014-06-12 DIAGNOSIS — J209 Acute bronchitis, unspecified: Secondary | ICD-10-CM | POA: Diagnosis not present

## 2014-06-12 MED ORDER — PREDNISONE 20 MG PO TABS: 20.0000 mg | ORAL_TABLET | Freq: Every day | ORAL | Status: DC

## 2014-06-12 MED ORDER — ALBUTEROL SULFATE HFA 108 (90 BASE) MCG/ACT IN AERS: 2.0000 | INHALATION_SPRAY | Freq: Four times a day (QID) | RESPIRATORY_TRACT | Status: DC | PRN

## 2014-06-12 MED ORDER — AZITHROMYCIN 250 MG PO TABS: ORAL_TABLET | ORAL | Status: DC

## 2014-06-12 NOTE — Patient Instructions (Signed)
For your wheezing, shortness of breath with productive cough, I am prescribing prednisone. I want to you to continue dulera and neb treatment q 6 hours.(You can use  albuterol pocket inhaler in place of neb if not at home.) For bronchitis with copd excacerbation I am prescribing azithromycin. Please get cxr today. Follow up in 7 days or as needed.

## 2014-06-12 NOTE — Assessment & Plan Note (Signed)
Will give 5 days or prednisone. Moderate dose. Rx albuterol inhaler. Continue dulera.. CXR today.

## 2014-06-12 NOTE — Progress Notes (Signed)
   Subjective:    Patient ID: Katie [name], female    DOB: 1946/06/26, 68 y.o.   MRN: [id number]  HPI   Pt in with some shortness of breath. Pt states she has history copd. Diagnosed with this 2 years ago. Pt was hospitalized twice in the past for copd. Pt states she feels chest congested. Pt took neb treatment about 1.5 day ago. Aslo uses dulera once a day. She does not have proventil inhaler. Pt got back from a trip at beach. Pt states humid air at beach and very cold ac in her room seemed to cause airway tightness. Pt smokes a pack a day. Smoked since 68 yo. Pt is hearing some wheezing with dyppnea. Bring up mucous when she coughs.  No chest pain.   Review of Systems  Constitutional: Negative for fever, chills and fatigue.  Respiratory: Positive for cough, shortness of breath and wheezing.   Cardiovascular: Negative for chest pain and palpitations.       No chest pain. Describes can't take full deep breath due to lungs feeling tight/restricted. Post neb this cleared up.  Gastrointestinal: Negative.   Musculoskeletal: Negative.  Negative for back pain.  Skin: Negative.   Neurological: Negative for dizziness, facial asymmetry, speech difficulty, weakness, light-headedness, numbness and headaches.  Hematological: Negative for adenopathy. Does not bruise/bleed easily.  Psychiatric/Behavioral: Negative.        Objective:   Physical Exam  General  Mental Status - Alert. General Appearance - Well groomed. Not in acute distress.  Skin Rashes- No Rashes.  HEENT Head- Normal. Ear Auditory Canal - Left- Normal. Right - Normal.Tympanic Membrane- Left- Normal. Right- Normal. Eye Sclera/Conjunctiva- Left- Normal. Right- Normal. Nose & Sinuses Nasal Mucosa- Left- Not Boggy or Congested. Right- Not Boggy or Congested. Mouth & Throat Lips: Upper Lip- Normal: no dryness, cracking, pallor, cyanosis, or vesicular eruption. Lower Lip-Normal: no dryness, cracking, pallor, cyanosis or  vesicular eruption. Buccal Mucosa- Bilateral- No Aphthous ulcers. Oropharynx- No Discharge or Erythema. Tonsils: Characteristics- Bilateral- No Erythema or Congestion. Size/Enlargement- Bilateral- No enlargement. Discharge- bilateral-None.  Neck Neck- Supple. No Masses.   Chest and Lung Exam Auscultation: Breath Sounds:-Even, unlabored but shallow bilateral and expiratory wheezing.(Post neb treatment, deep and clear respirations. )No wheezing.  Cardiovascular Auscultation:Rythm- Regular.  Murmurs & Other Heart Sounds:Ausculatation of the heart reveal- No Murmurs.  Lymphatic Head & Neck General Head & Neck Lymphatics: Bilateral: Description- No Localized lymphadenopathy.        Assessment & Plan:  Duo neb tx given in office today.

## 2014-06-12 NOTE — Assessment & Plan Note (Signed)
Some productive cough and history of smoking. Will give azithromycin rx. Will get cxr as well.

## 2014-06-12 NOTE — Progress Notes (Signed)
Pre visit review using our clinic review tool, if applicable. No additional management support is needed unless otherwise documented below in the visit note. 

## 2014-06-19 ENCOUNTER — Telehealth: Payer: Self-pay | Admitting: Physician Assistant

## 2014-06-19 ENCOUNTER — Encounter (HOSPITAL_COMMUNITY): Payer: Self-pay | Admitting: Emergency Medicine

## 2014-06-19 ENCOUNTER — Emergency Department (HOSPITAL_COMMUNITY): Payer: Medicare Other

## 2014-06-19 ENCOUNTER — Emergency Department (HOSPITAL_COMMUNITY)
Admission: EM | Admit: 2014-06-19 | Discharge: 2014-06-19 | Disposition: A | Discharge status: Home / Self Care | Payer: Medicare Other | Attending: Emergency Medicine | Admitting: Emergency Medicine

## 2014-06-19 DIAGNOSIS — J441 Chronic obstructive pulmonary disease with (acute) exacerbation: Secondary | ICD-10-CM | POA: Insufficient documentation

## 2014-06-19 DIAGNOSIS — F17200 Nicotine dependence, unspecified, uncomplicated: Secondary | ICD-10-CM | POA: Diagnosis not present

## 2014-06-19 DIAGNOSIS — R011 Cardiac murmur, unspecified: Secondary | ICD-10-CM | POA: Diagnosis not present

## 2014-06-19 DIAGNOSIS — E876 Hypokalemia: Secondary | ICD-10-CM | POA: Diagnosis not present

## 2014-06-19 DIAGNOSIS — Z8701 Personal history of pneumonia (recurrent): Secondary | ICD-10-CM | POA: Insufficient documentation

## 2014-06-19 DIAGNOSIS — R000 Tachycardia, unspecified: Secondary | ICD-10-CM | POA: Insufficient documentation

## 2014-06-19 DIAGNOSIS — Z8739 Personal history of other diseases of the musculoskeletal system and connective tissue: Secondary | ICD-10-CM | POA: Diagnosis not present

## 2014-06-19 DIAGNOSIS — I10 Essential (primary) hypertension: Secondary | ICD-10-CM | POA: Insufficient documentation

## 2014-06-19 DIAGNOSIS — J984 Other disorders of lung: Secondary | ICD-10-CM | POA: Diagnosis not present

## 2014-06-19 DIAGNOSIS — R002 Palpitations: Secondary | ICD-10-CM

## 2014-06-19 DIAGNOSIS — F419 Anxiety disorder, unspecified: Secondary | ICD-10-CM

## 2014-06-19 DIAGNOSIS — K219 Gastro-esophageal reflux disease without esophagitis: Secondary | ICD-10-CM | POA: Insufficient documentation

## 2014-06-19 DIAGNOSIS — F411 Generalized anxiety disorder: Secondary | ICD-10-CM | POA: Insufficient documentation

## 2014-06-19 DIAGNOSIS — Z8553 Personal history of malignant neoplasm of renal pelvis: Secondary | ICD-10-CM | POA: Diagnosis not present

## 2014-06-19 DIAGNOSIS — E7800 Pure hypercholesterolemia, unspecified: Secondary | ICD-10-CM | POA: Insufficient documentation

## 2014-06-19 DIAGNOSIS — Z792 Long term (current) use of antibiotics: Secondary | ICD-10-CM | POA: Diagnosis not present

## 2014-06-19 DIAGNOSIS — IMO0002 Reserved for concepts with insufficient information to code with codable children: Secondary | ICD-10-CM | POA: Diagnosis not present

## 2014-06-19 DIAGNOSIS — Z79899 Other long term (current) drug therapy: Secondary | ICD-10-CM | POA: Insufficient documentation

## 2014-06-19 DIAGNOSIS — J42 Unspecified chronic bronchitis: Secondary | ICD-10-CM | POA: Diagnosis not present

## 2014-06-19 LAB — I-STAT TROPONIN, ED: Troponin i, poc: 0.01 ng/mL (ref 0.00–0.08)

## 2014-06-19 LAB — CBC
HCT: 38.6 % (ref 36.0–46.0)
HEMOGLOBIN: 12.9 g/dL (ref 12.0–15.0)
MCH: 29.7 pg (ref 26.0–34.0)
MCHC: 33.4 g/dL (ref 30.0–36.0)
MCV: 88.9 fL (ref 78.0–100.0)
Platelets: 327 10*3/uL (ref 150–400)
RBC: 4.34 MIL/uL (ref 3.87–5.11)
RDW: 12.7 % (ref 11.5–15.5)
WBC: 11.6 10*3/uL — ABNORMAL HIGH (ref 4.0–10.5)

## 2014-06-19 LAB — BASIC METABOLIC PANEL
Anion gap: 13 (ref 5–15)
BUN: 21 mg/dL (ref 6–23)
CO2: 27 meq/L (ref 19–32)
Calcium: 9.8 mg/dL (ref 8.4–10.5)
Chloride: 106 mEq/L (ref 96–112)
Creatinine, Ser: 1.29 mg/dL — ABNORMAL HIGH (ref 0.50–1.10)
GFR calc Af Amer: 49 mL/min — ABNORMAL LOW (ref >90)
GFR calc non Af Amer: 42 mL/min — ABNORMAL LOW (ref >90)
GLUCOSE: 89 mg/dL (ref 70–99)
Potassium: 3.3 mEq/L — ABNORMAL LOW (ref 3.7–5.3)
Sodium: 146 mEq/L (ref 137–147)

## 2014-06-19 MED ORDER — POTASSIUM CHLORIDE CRYS ER 20 MEQ PO TBCR
40.0000 meq | EXTENDED_RELEASE_TABLET | Freq: Once | ORAL | Status: AC
  Administered 2014-06-19: 40 meq via ORAL
  Filled 2014-06-19: qty 2

## 2014-06-19 MED ORDER — IPRATROPIUM-ALBUTEROL 0.5-2.5 (3) MG/3ML IN SOLN
3.0000 mL | Freq: Once | RESPIRATORY_TRACT | Status: AC
  Administered 2014-06-19: 3 mL via RESPIRATORY_TRACT
  Filled 2014-06-19: qty 3

## 2014-06-19 MED ORDER — LORAZEPAM 1 MG PO TABS
1.0000 mg | ORAL_TABLET | Freq: Once | ORAL | Status: AC
Start: 2014-06-19 — End: 2014-06-19
  Administered 2014-06-19: 1 mg via ORAL
  Filled 2014-06-19: qty 1

## 2014-06-19 NOTE — ED Provider Notes (Signed)
CSN: [id number]     Arrival date & time 06/19/14  1337 History   First MD Initiated Contact with Patient 06/19/14 1401     Chief Complaint  Patient presents with  . Tachycardia     (Consider location/radiation/quality/duration/timing/severity/associated sxs/prior Treatment) The history is provided by the patient.  pt with hx copd, c/o sense pounding and rapid heart beat x 2 days, constant, as well as tingling bilateral finger tips. Occurs at rest, states esp feels pounding when sitting/lying at rest. No relation to activity or exertion. No syncope. Denies hx cad, no hx dysrhythmia. No unusual doe or fatigue. No associated nv or diaphoresis.  No heat intolerance, sweats, or recent wt change. No recent change in meds. Occasional non prod cough. No fever or chills. +smoker. occ wheezing.     Past Medical History  Diagnosis Date  . Arthritis   . COPD (chronic obstructive pulmonary disease)     no history of PFTs, only diagnosed by CXR, not on any inhalers because she has not had a PCP in over 2 years  . Hypertension     previously on Lisinopril/HCTZ  . History of heartburn   . Hypercholesteremia     previously on pravastatin   . Cholelithiasis 2011    s/p cholescystectomy   . Renal cell carcinoma 01/2010    s/p right radical nephrectomy 01/2010,  followed by [facility]  . Murmur     no  prior work-up  . GERD (gastroesophageal reflux disease)   . Emphysema 2010    dx'd/CXR  . Shortness of breath 09/26/11    "lately all the time"  . Pneumonia 1980's    "walking"   Past Surgical History  Procedure Laterality Date  . Cholecystectomy  01/2010  . Nephrectomy radical  01/2010    right   . Tubal ligation  1970's  . Diagnostic laparoscopy    . Ovarian cyst removal  1970's    "went through belly button"  . Appendectomy  1970's  . Tee without cardioversion  10/01/2011    Procedure: TRANSESOPHAGEAL ECHOCARDIOGRAM (TEE);  Surgeon: [name];  Location: [facility];   Service: Cardiovascular;  Laterality: N/A;  . US echocardiography  09/2011  . Cardiac catheterization  09/2011  . Multiple extractions with alveoloplasty  10/06/2011    Procedure: MULTIPLE EXTRACION WITH ALVEOLOPLASTY;  Surgeon: Lenn Cal, DDS;  Location: [facility];  Service: Oral Surgery;  Laterality: N/A;  Multiple extraction of tooth #'s 1, 6, 8, 10, 11, 22, 23, 26, 27, 28, 29 with alveoloplasty and Upper right buccal exostoses reductions.   Family History  Problem Relation Age of Onset  . COPD Mother     DECEASED   History  Substance Use Topics  . Smoking status: Current Some Day Smoker -- 1.00 packs/day for 50 years    Types: Cigarettes    Last Attempt to Quit: 01/14/2014  . Smokeless tobacco: Never Used     Comment: smoking cessation consult entered  . Alcohol Use: Yes     Comment:  drink very rarely"   OB History   Grav Para Term Preterm Abortions TAB SAB Ect Mult Living                 Review of Systems  Constitutional: Negative for fever and chills.  HENT: Negative for sore throat.   Eyes: Negative for redness.  Respiratory: Positive for cough. Negative for shortness of breath.   Cardiovascular: Positive for palpitations. Negative for chest pain and leg  swelling.  Gastrointestinal: Negative for vomiting and abdominal pain.  Genitourinary: Negative for flank pain.  Musculoskeletal: Negative for back pain and neck pain.  Skin: Negative for rash.  Neurological: Negative for syncope and headaches.  Hematological: Does not bruise/bleed easily.  Psychiatric/Behavioral: Negative for confusion.      Allergies  Review of patient's allergies indicates no known allergies.  Home Medications   Prior to Admission medications   Medication Sig Start Date End Date Taking? Authorizing Provider  albuterol (PROVENTIL HFA;VENTOLIN HFA) 108 (90 BASE) MCG/ACT inhaler Inhale 2 puffs into the lungs every 6 (six) hours as needed for wheezing or shortness of breath. 06/12/14  Yes  Meriam Sprague Saguier, PA-C  albuterol (PROVENTIL) (2.5 MG/3ML) 0.083% nebulizer solution Take 2.5 mg by nebulization every 6 (six) hours as needed for wheezing or shortness of breath.   Yes Historical Provider, MD  ALPRAZolam Duanne Moron) 1 MG tablet Take 1 mg by mouth at bedtime as needed. 01/04/14  Yes Brunetta Jeans, PA-C  atorvastatin (LIPITOR) 20 MG tablet Take 1 tablet (20 mg total) by mouth daily. 08/10/13  Yes Brunetta Jeans, PA-C  azithromycin (ZITHROMAX) 250 MG tablet 2 tab po day 1 then 1 tab po x 4 days 06/12/14  Yes Meriam Sprague Saguier, PA-C  lisinopril (PRINIVIL,ZESTRIL) 10 MG tablet Take 1 tablet (10 mg total) by mouth daily. 08/15/13  Yes Brunetta Jeans, PA-C  mometasone-formoterol (DULERA) 100-5 MCG/ACT AERO Inhale 2 puffs into the lungs 2 (two) times daily. 01/19/14  Yes Bobby Rumpf York, PA-C  predniSONE (DELTASONE) 20 MG tablet Take 1 tablet (20 mg total) by mouth daily with breakfast. 06/12/14  Yes Meriam Sprague Saguier, PA-C  zolpidem (AMBIEN) 5 MG tablet Take 1 tablet (5 mg total) by mouth at bedtime as needed for sleep. 01/19/14  Yes Marianne L York, PA-C   BP 135/90  Pulse 95  Temp(Src) 98.3 F (36.8 C) (Oral)  Resp 24  Ht 5\' 1"  (1.549 m)  Wt 143 lb (64.864 kg)  BMI 27.03 kg/m2  SpO2 98% Physical Exam  Nursing note and vitals reviewed. Constitutional: She is oriented to person, place, and time. She appears well-developed and well-nourished. No distress.  HENT:  Mouth/Throat: Oropharynx is clear and moist.  Eyes: Conjunctivae are normal. No scleral icterus.  Neck: Neck supple. No tracheal deviation present.  Cardiovascular: Normal rate, regular rhythm, normal heart sounds and intact distal pulses.  Exam reveals no gallop and no friction rub.   No murmur heard. Pulmonary/Chest: Effort normal and breath sounds normal. No respiratory distress.  Abdominal: Soft. Normal appearance and bowel sounds are normal. She exhibits no distension. There is no tenderness.  Musculoskeletal: She  exhibits no edema and no tenderness.  Neurological: She is alert and oriented to person, place, and time.  Skin: Skin is warm and dry. No rash noted. She is not diaphoretic.  Psychiatric:  Anxious.    ED Course  Procedures (including critical care time) Labs Review   Results for orders placed during the hospital encounter of 06/19/14  CBC      Result Value Ref Range   WBC 11.6 (*) 4.0 - 10.5 K/uL   RBC 4.34  3.87 - 5.11 MIL/uL   Hemoglobin 12.9  12.0 - 15.0 g/dL   HCT 38.6  36.0 - 46.0 %   MCV 88.9  78.0 - 100.0 fL   MCH 29.7  26.0 - 34.0 pg   MCHC 33.4  30.0 - 36.0 g/dL   RDW 12.7  11.5 -  15.5 %   Platelets 327  150 - 400 K/uL  BASIC METABOLIC PANEL      Result Value Ref Range   Sodium 146  137 - 147 mEq/L   Potassium 3.3 (*) 3.7 - 5.3 mEq/L   Chloride 106  96 - 112 mEq/L   CO2 27  19 - 32 mEq/L   Glucose, Bld 89  70 - 99 mg/dL   BUN 21  6 - 23 mg/dL   Creatinine, Ser 1.29 (*) 0.50 - 1.10 mg/dL   Calcium 9.8  8.4 - 10.5 mg/dL   GFR calc non Af Amer 42 (*) >90 mL/min   GFR calc Af Amer 49 (*) >90 mL/min   Anion gap 13  5 - 15  I-STAT TROPOININ, ED      Result Value Ref Range   Troponin i, poc 0.01  0.00 - 0.08 ng/mL   Comment 3            Dg Chest 2 View  06/19/2014   CLINICAL DATA:  Tachycardia for 2 days, I 2 tension, smoker, COPD, GERD, past history renal cell carcinoma  EXAM: CHEST  2 VIEW  COMPARISON:  01/18/2014  FINDINGS: Normal heart size and pulmonary vascularity.  Tortuous aorta.  Chronic bronchitic changes with mild biapical scarring.  No pulmonary Infiltrate, pleural effusion, or pneumothorax.  Bones appear demineralized.  IMPRESSION: Chronic bronchitic changes with biapical scarring.  No acute abnormalities.   Electronically Signed   By: Lavonia Dana M.D.   On: 06/19/2014 15:15      EKG Interpretation   Date/Time:  Monday June 19 2014 13:47:14 EDT Ventricular Rate:  85 PR Interval:  132 QRS Duration: 90 QT Interval:  382 QTC Calculation: 400 R  Axis:   46 Text Interpretation:  Normal sinus rhythm Left ventricular hypertrophy No  significant change since last tracing Confirmed by Ashok Cordia  MD, Lennette Bihari  ([id number]) on 06/19/2014 2:14:21 PM      MDM  Labs. Monitor. Ecg. Cxr.  Reviewed nursing notes and prior charts for additional history.   Pt very anxious appearing. Tingling in bil hands.  Hyperventil.  Ativan 1 mg po.  Reviewed prior records, cardiac cath 2012 with normal coronaries, echo unremarkable.  After symptoms for 2 days, tropn normal/negative. Prior cath neg.   On monitor remains in nsr, even during symptoms.  Anxiety improved post rx.   Pt states has adequate supply of all her normal meds.  No increased wob. No wheezing.  Pt appears stable for d/c.      Mirna Mires, MD 06/19/14 [phone]

## 2014-06-19 NOTE — ED Notes (Signed)
Dr. Steinl at bedside 

## 2014-06-19 NOTE — Telephone Encounter (Signed)
Caller name: Danette   Relation to pt: self  Call back number: [phone]   Reason for call:   pt exp dizziness and BP 157/82, due to  BP being so high first avail time is not until 4:15pm. Transfer to [facility]

## 2014-06-19 NOTE — Telephone Encounter (Signed)
Patient Information:  Caller Name: Shakeyla  Phone: [phone]  Patient: Katie, [name]  Gender: Female  DOB: [birth year]  Age: 68 Years  PCP: [name] "Einar Pheasant"  Office Follow Up:  Does the office need to follow up with this patient?: No  Instructions For The Office: N/A   Symptoms  Reason For Call & Symptoms: Pt is calling to say that her heart has felt as if its racing. It is racing right now. RN had the pt count the rate which is 92. It was regular. Pt was into the office last week and diagnosed with bronchitis - she was prescribed an Albuterol inhaler which she takes prn. Pt was prescribed Prednisone which she did not take. Her BP is 157/101 Pt states this has gone on for 3 days while she is awake. She does sleep but she feels her heart pounding loudly and too fast. Today pt is SOB and is dizzy intermittently  Reviewed Health History In EMR: Yes  Reviewed Medications In EMR: Yes  Reviewed Allergies In EMR: Yes  Reviewed Surgeries / Procedures: Yes  Date of Onset of Symptoms: 06/16/2014  Guideline(s) Used:  Heart Rate and Heartbeat Questions  Disposition Per Guideline:   Go to ED Now/Pt will go to [facility] since it is closest to her.  Reason For Disposition Reached:   Dizziness, lightheadedness, or weakness  Advice Given:  N/A  Patient Will Follow Care Advice:  YES

## 2014-06-19 NOTE — ED Notes (Signed)
Pt to xray

## 2014-06-19 NOTE — Discharge Instructions (Signed)
For palpitations, follow up with your cardiologist in the coming week - call office to arrange appointment. From today's lab tests, your potassium level is mildly low (3.3) - eat plenty of fruits and vegetables, and follow up with your doctor in 1 week. If anxiety, take your xanax as need.  Avoid smoking.  Limit caffeine use.  Return to ER if worse, new symptoms, fevers, fainting, persistent fast heart beat, chest pain, trouble breathing, other concern.  You were given medication in the ER that can cause drowsiness, no driving for the next 4 hours.    Palpitations A palpitation is the feeling that your heartbeat is irregular or is faster than normal. It may feel like your heart is fluttering or skipping a beat. Palpitations are usually not a serious problem. However, in some cases, you may need further medical evaluation. CAUSES  Palpitations can be caused by:  Smoking.  Caffeine or other stimulants, such as diet pills or energy drinks.  Alcohol.  Stress and anxiety.  Strenuous physical activity.  Fatigue.  Certain medicines.  Heart disease, especially if you have a history of irregular heart rhythms (arrhythmias), such as atrial fibrillation, atrial flutter, or supraventricular tachycardia.  An improperly working pacemaker or defibrillator. DIAGNOSIS  To find the cause of your palpitations, your health care provider will take your medical history and perform a physical exam. Your health care provider may also have you take a test called an ambulatory electrocardiogram (ECG). An ECG records your heartbeat patterns over a 24-hour period. You may also have other tests, such as:  Transthoracic echocardiogram (TTE). During echocardiography, sound waves are used to evaluate how blood flows through your heart.  Transesophageal echocardiogram (TEE).  Cardiac monitoring. This allows your health care provider to monitor your heart rate and rhythm in real time.  Holter monitor. This is  a portable device that records your heartbeat and can help diagnose heart arrhythmias. It allows your health care provider to track your heart activity for several days, if needed.  Stress tests by exercise or by giving medicine that makes the heart beat faster. TREATMENT  Treatment of palpitations depends on the cause of your symptoms and can vary greatly. Most cases of palpitations do not require any treatment other than time, relaxation, and monitoring your symptoms. Other causes, such as atrial fibrillation, atrial flutter, or supraventricular tachycardia, usually require further treatment. HOME CARE INSTRUCTIONS   Avoid:  Caffeinated coffee, tea, soft drinks, diet pills, and energy drinks.  Chocolate.  Alcohol.  Stop smoking if you smoke.  Reduce your stress and anxiety. Things that can help you relax include:  A method of controlling things in your body, such as your heartbeats, with your mind (biofeedback).  Yoga.  Meditation.  Physical activity such as swimming, jogging, or walking.  Get plenty of rest and sleep. SEEK MEDICAL CARE IF:   You continue to have a fast or irregular heartbeat beyond 24 hours.  Your palpitations occur more often. SEEK IMMEDIATE MEDICAL CARE IF:  You have chest pain or shortness of breath.  You have a severe headache.  You feel dizzy or you faint. MAKE SURE YOU:  Understand these instructions.  Will watch your condition.  Will get help right away if you are not doing well or get worse. Document Released: 10/03/2000 Document Revised: 10/11/2013 Document Reviewed: 12/05/2011 [facility] Patient Information 2015 [place], [place]. This information is not intended to replace advice given to you by your health care provider. Make sure you discuss any questions you  have with your health care provider.   Hypokalemia Hypokalemia means that the amount of potassium in the blood is lower than normal.Potassium is a chemical, called an electrolyte,  that helps regulate the amount of fluid in the body. It also stimulates muscle contraction and helps nerves function properly.Most of the body's potassium is inside of cells, and only a very small amount is in the blood. Because the amount in the blood is so small, minor changes can be life-threatening. CAUSES  Antibiotics.  Diarrhea or vomiting.  Using laxatives too much, which can cause diarrhea.  Chronic kidney disease.  Water pills (diuretics).  Eating disorders (bulimia).  Low magnesium level.  Sweating a lot. SIGNS AND SYMPTOMS  Weakness.  Constipation.  Fatigue.  Muscle cramps.  Mental confusion.  Skipped heartbeats or irregular heartbeat (palpitations).  Tingling or numbness. DIAGNOSIS  Your health care provider can diagnose hypokalemia with blood tests. In addition to checking your potassium level, your health care provider may also check other lab tests. TREATMENT Hypokalemia can be treated with potassium supplements taken by mouth or adjustments in your current medicines. If your potassium level is very low, you may need to get potassium through a vein (IV) and be monitored in the hospital. A diet high in potassium is also helpful. Foods high in potassium are:  Nuts, such as peanuts and pistachios.  Seeds, such as sunflower seeds and pumpkin seeds.  Peas, lentils, and lima beans.  Whole grain and bran cereals and breads.  Fresh fruit and vegetables, such as apricots, avocado, bananas, cantaloupe, kiwi, oranges, tomatoes, asparagus, and potatoes.  Orange and tomato juices.  Red meats.  Fruit yogurt. HOME CARE INSTRUCTIONS  Take all medicines as prescribed by your health care provider.  Maintain a healthy diet by including nutritious food, such as fruits, vegetables, nuts, whole grains, and lean meats.  If you are taking a laxative, be sure to follow the directions on the label. SEEK MEDICAL CARE IF:  Your weakness gets worse.  You feel your  heart pounding or racing.  You are vomiting or having diarrhea.  You are diabetic and having trouble keeping your blood glucose in the normal range. SEEK IMMEDIATE MEDICAL CARE IF:  You have chest pain, shortness of breath, or dizziness.  You are vomiting or having diarrhea for more than 2 days.  You faint. MAKE SURE YOU:   Understand these instructions.  Will watch your condition.  Will get help right away if you are not doing well or get worse. Document Released: 10/06/2005 Document Revised: 07/27/2013 Document Reviewed: 04/08/2013 [facility] Patient Information 2015 [place], [place]. This information is not intended to replace advice given to you by your health care provider. Make sure you discuss any questions you have with your health care provider.   Generalized Anxiety Disorder Generalized anxiety disorder (GAD) is a mental disorder. It interferes with life functions, including relationships, work, and school. GAD is different from normal anxiety, which everyone experiences at some point in their lives in response to specific life events and activities. Normal anxiety actually helps [place] prepare for and get through these life events and activities. Normal anxiety goes away after the event or activity is over.  GAD causes anxiety that is not necessarily related to specific events or activities. It also causes excess anxiety in proportion to specific events or activities. The anxiety associated with GAD is also difficult to control. GAD can vary from mild to severe. People with severe GAD can have intense waves of anxiety  with physical symptoms (panic attacks).  SYMPTOMS The anxiety and worry associated with GAD are difficult to control. This anxiety and worry are related to many life events and activities and also occur more days than not for 6 months or longer. People with GAD also have three or more of the following symptoms (one or more in children):  Restlessness.    Fatigue.  Difficulty concentrating.   Irritability.  Muscle tension.  Difficulty sleeping or unsatisfying sleep. DIAGNOSIS GAD is diagnosed through an assessment by your health care provider. Your health care provider will ask you questions aboutyour mood,physical symptoms, and events in your life. Your health care provider may ask you about your medical history and use of alcohol or drugs, including prescription medicines. Your health care provider may also do a physical exam and blood tests. Certain medical conditions and the use of certain substances can cause symptoms similar to those associated with GAD. Your health care provider may refer you to a mental health specialist for further evaluation. TREATMENT The following therapies are usually used to treat GAD:   Medication. Antidepressant medication usually is prescribed for long-term daily control. Antianxiety medicines may be added in severe cases, especially when panic attacks occur.   Talk therapy (psychotherapy). Certain types of talk therapy can be helpful in treating GAD by providing support, education, and guidance. A form of talk therapy called cognitive behavioral therapy can teach you healthy ways to think about and react to daily life events and activities.  Stress managementtechniques. These include yoga, meditation, and exercise and can be very helpful when they are practiced regularly. A mental health specialist can help determine which treatment is best for you. Some people see improvement with one therapy. However, other people require a combination of therapies. Document Released: 01/31/2013 Document Revised: 02/20/2014 Document Reviewed: 01/31/2013 [facility] Patient Information 2015 [place], [place]. This information is not intended to replace advice given to you by your health care provider. Make sure you discuss any questions you have with your health care provider.   Smoking Hazards Smoking cigarettes is  extremely bad for your health. Tobacco smoke has over 200 known poisons in it. It contains the poisonous gases nitrogen oxide and carbon monoxide. There are over 60 chemicals in tobacco smoke that cause cancer. Some of the chemicals found in cigarette smoke include:   Cyanide.   Benzene.   Formaldehyde.   Methanol (wood alcohol).   Acetylene (fuel used in welding torches).   Ammonia.  Even smoking lightly shortens your life expectancy by several years. You can greatly reduce the risk of medical problems for you and your family by stopping now. Smoking is the most preventable cause of death and disease in our society. Within days of quitting smoking, your circulation improves, you decrease the risk of having a heart attack, and your lung capacity improves. There may be some increased phlegm in the first few days after quitting, and it may take months for your lungs to clear up completely. Quitting for 10 years reduces your risk of developing lung cancer to almost that of a nonsmoker.  WHAT ARE THE RISKS OF SMOKING? Cigarette smokers have an increased risk of many serious medical problems, including:  Lung cancer.   Lung disease (such as pneumonia, bronchitis, and emphysema).   Heart attack and chest pain due to the heart not getting enough oxygen (angina).   Heart disease and peripheral blood vessel disease.   Hypertension.   Stroke.   Oral cancer (cancer of the lip,  mouth, or voice box).   Bladder cancer.   Pancreatic cancer.   Cervical cancer.   Pregnancy complications, including premature birth.   Stillbirths and smaller newborn babies, birth defects, and genetic damage to sperm.   Early menopause.   Lower estrogen level for women.   Infertility.   Facial wrinkles.   Blindness.   Increased risk of broken bones (fractures).   Senile dementia.   Stomach ulcers and internal bleeding.   Delayed wound healing and increased risk of  complications during surgery. Because of secondhand smoke exposure, children of smokers have an increased risk of the following:   Sudden infant death syndrome (SIDS).   Respiratory infections.   Lung cancer.   Heart disease.   Ear infections.  WHY IS SMOKING ADDICTIVE? Nicotine is the chemical agent in tobacco that is capable of causing addiction or dependence. When you smoke and inhale, nicotine is absorbed rapidly into the bloodstream through your lungs. Both inhaled and noninhaled nicotine may be addictive.  WHAT ARE THE BENEFITS OF QUITTING?  There are many health benefits to quitting smoking. Some are:   The likelihood of developing cancer and heart disease decreases. Health improvements are seen almost immediately.   Blood pressure, pulse rate, and breathing patterns start returning to normal soon after quitting.   People who quit may see an improvement in their overall quality of life.  HOW DO YOU QUIT SMOKING? Smoking is an addiction with both physical and psychological effects, and longtime habits can be hard to change. Your health care provider can recommend:  Programs and community resources, which may include group support, education, or therapy.  Replacement products, such as patches, gum, and nasal sprays. Use these products only as directed. Do not replace cigarette smoking with electronic cigarettes (commonly called e-cigarettes). The safety of e-cigarettes is unknown, and some may contain harmful chemicals. FOR MORE INFORMATION  American Lung Association: www.lung.org  American Cancer Society: www.cancer.org Document Released: 11/13/2004 Document Revised: 07/27/2013 Document Reviewed: 03/28/2013 [facility] Patient Information 2015 [place], [place]. This information is not intended to replace advice given to you by your health care provider. Make sure you discuss any questions you have with your health care provider.    Smoking Cessation Quitting smoking  is important to your health and has many advantages. However, it is not always easy to quit since nicotine is a very addictive drug. Oftentimes, people try 3 times or more before being able to quit. This document explains the best ways for you to prepare to quit smoking. Quitting takes hard work and a lot of effort, but you can do it. ADVANTAGES OF QUITTING SMOKING  You will live longer, feel better, and live better.  Your body will feel the impact of quitting smoking almost immediately.  Within 20 minutes, blood pressure decreases. Your pulse returns to its normal level.  After 8 hours, carbon monoxide levels in the blood return to normal. Your oxygen level increases.  After 24 hours, the chance of having a heart attack starts to decrease. Your breath, hair, and body stop smelling like smoke.  After 48 hours, damaged nerve endings begin to recover. Your sense of taste and smell improve.  After 72 hours, the body is virtually free of nicotine. Your bronchial tubes relax and breathing becomes easier.  After 2 to 12 weeks, lungs can hold more air. Exercise becomes easier and circulation improves.  The risk of having a heart attack, stroke, cancer, or lung disease is greatly reduced.  After 1 year,  the risk of coronary heart disease is cut in half.  After 5 years, the risk of stroke falls to the same as a nonsmoker.  After 10 years, the risk of lung cancer is cut in half and the risk of other cancers decreases significantly.  After 15 years, the risk of coronary heart disease drops, usually to the level of a nonsmoker.  If you are pregnant, quitting smoking will improve your chances of having a healthy baby.  The people you live with, especially any children, will be healthier.  You will have extra money to spend on things other than cigarettes. QUESTIONS TO THINK ABOUT BEFORE ATTEMPTING TO QUIT You may want to talk about your answers with your health care provider.  Why do you want  to quit?  If you tried to quit in the past, what helped and what did not?  What will be the most difficult situations for you after you quit? How will you plan to handle them?  Who can help you through the tough times? Your family? Friends? A health care provider?  What pleasures do you get from smoking? What ways can you still get pleasure if you quit? Here are some questions to ask your health care provider:  How can you help me to be successful at quitting?  What medicine do you think would be best for me and how should I take it?  What should I do if I need more help?  What is smoking withdrawal like? How can I get information on withdrawal? GET READY  Set a quit date.  Change your environment by getting rid of all cigarettes, ashtrays, matches, and lighters in your home, car, or work. Do not let people smoke in your home.  Review your past attempts to quit. Think about what worked and what did not. GET SUPPORT AND ENCOURAGEMENT You have a better chance of being successful if you have help. You can get support in many ways.  Tell your family, friends, and coworkers that you are going to quit and need their support. Ask them not to smoke around you.  Get individual, group, or telephone counseling and support. Programs are available at [employer] and health centers. Call your local health department for information about programs in your area.  Spiritual beliefs and practices may help some smokers quit.  Download a "quit meter" on your computer to keep track of quit statistics, such as how long you have gone without smoking, cigarettes not smoked, and money saved.  Get a self-help book about quitting smoking and staying off tobacco. [place] yourself from urges to smoke. Talk to someone, go for a walk, or occupy your time with a task.  Change your normal routine. Take a different route to work. Drink tea instead of coffee. Eat breakfast  in a different place.  Reduce your stress. Take a hot bath, exercise, or read a book.  Plan something enjoyable to do every day. Reward yourself for not smoking.  Explore interactive web-based programs that specialize in helping you quit. GET MEDICINE AND USE IT CORRECTLY Medicines can help you stop smoking and decrease the urge to smoke. Combining medicine with the above behavioral methods and support can greatly increase your chances of successfully quitting smoking.  Nicotine replacement therapy helps deliver nicotine to your body without the negative effects and risks of smoking. Nicotine replacement therapy includes nicotine gum, lozenges, inhalers, nasal sprays, and skin patches. Some may be available over-the-counter  and others require a prescription.  Antidepressant medicine helps people abstain from smoking, but how this works is unknown. This medicine is available by prescription.  Nicotinic receptor partial agonist medicine simulates the effect of nicotine in your brain. This medicine is available by prescription. Ask your health care provider for advice about which medicines to use and how to use them based on your health history. Your health care provider will tell you what side effects to look out for if you choose to be on a medicine or therapy. Carefully read the information on the package. Do not use any other product containing nicotine while using a nicotine replacement product.  RELAPSE OR DIFFICULT SITUATIONS Most relapses occur within the first 3 months after quitting. Do not be discouraged if you start smoking again. Remember, most people try several times before finally quitting. You may have symptoms of withdrawal because your body is used to nicotine. You may crave cigarettes, be irritable, feel very hungry, cough often, get headaches, or have difficulty concentrating. The withdrawal symptoms are only temporary. They are strongest when you first quit, but they will go away  within 10-14 days. To reduce the chances of relapse, try to:  Avoid drinking alcohol. Drinking lowers your chances of successfully quitting.  Reduce the amount of caffeine you consume. Once you quit smoking, the amount of caffeine in your body increases and can give you symptoms, such as a rapid heartbeat, sweating, and anxiety.  Avoid smokers because they can make you want to smoke.  Do not let weight gain distract you. Many smokers will gain weight when they quit, usually less than 10 pounds. Eat a healthy diet and stay active. You can always lose the weight gained after you quit.  Find ways to improve your mood other than smoking. FOR MORE INFORMATION  www.smokefree.gov  Document Released: 09/30/2001 Document Revised: 02/20/2014 Document Reviewed: 01/15/2012 [facility] Patient Information 2015 [place], [place]. This information is not intended to replace advice given to you by your health care provider. Make sure you discuss any questions you have with your health care provider.

## 2014-06-19 NOTE — Telephone Encounter (Signed)
Thank you for making me aware.  Patient is being treated in the ER.

## 2014-06-19 NOTE — ED Notes (Signed)
Patient states she has been having racing heart beat for 2 days.  Patient denies SOB, but claims she has COPD and has trouble some times.   Patient denies other problems.

## 2014-06-20 ENCOUNTER — Telehealth: Payer: Self-pay | Admitting: Physician Assistant

## 2014-06-20 NOTE — Telephone Encounter (Signed)
°  Caller name: Malicia Relation to pt: self  Call back number: [phone]   Reason for call:   Pt was discharged 01/17/14 in need of ED f/u pt is open to whatever date or time. Please advise your booked up

## 2014-06-20 NOTE — Telephone Encounter (Signed)
Left message for patient to return my call.

## 2014-06-20 NOTE — Telephone Encounter (Signed)
Tried to reach patient.  No answer.  I had previously sent message to front desk staff to schedule patient for an ER follow-up.  This needs to be done.  Also if you contact her please find out what her concerns are as being in clinic impairs the amount of time I have to contact her personally during the day.

## 2014-06-20 NOTE — Telephone Encounter (Signed)
Katie [name] [phone]  [facility] called and would like to talk to [facility] about her recent ED visit.

## 2014-06-20 NOTE — Telephone Encounter (Signed)
Appointment scheduled.

## 2014-06-21 NOTE — Telephone Encounter (Signed)
Appt was scheduled for 06/27/14.

## 2014-06-27 ENCOUNTER — Ambulatory Visit (INDEPENDENT_AMBULATORY_CARE_PROVIDER_SITE_OTHER): Payer: Medicare Other | Admitting: Physician Assistant

## 2014-06-27 ENCOUNTER — Encounter: Payer: Self-pay | Admitting: Physician Assistant

## 2014-06-27 VITALS — BP 162/107 | HR 79 | Temp 97.9°F | Wt 142.0 lb

## 2014-06-27 DIAGNOSIS — I10 Essential (primary) hypertension: Secondary | ICD-10-CM | POA: Diagnosis not present

## 2014-06-27 DIAGNOSIS — F17200 Nicotine dependence, unspecified, uncomplicated: Secondary | ICD-10-CM | POA: Diagnosis not present

## 2014-06-27 DIAGNOSIS — Z720 Tobacco use: Secondary | ICD-10-CM | POA: Insufficient documentation

## 2014-06-27 MED ORDER — LISINOPRIL 20 MG PO TABS: 20.0000 mg | ORAL_TABLET | Freq: Every day | ORAL | Status: DC

## 2014-06-27 MED ORDER — BUPROPION HCL ER (SR) 150 MG PO TB12: ORAL_TABLET | ORAL | Status: DC

## 2014-06-27 NOTE — Assessment & Plan Note (Signed)
Discussed treatment options.  Will Rx Wellbutrin 150 mg SR -- Take 1 tablet by mouth daily x 3 days.  Then increase to 1 tablet twice daily.  Follow-up in 3 weeks.

## 2014-06-27 NOTE — Progress Notes (Signed)
Patient presents to clinic today c/o for ER follow-up of tachycardia.  Patient was seen in ER on 06/19/14 with complaints of elevated BP and fast heart rate. Workup including troponins, CXR and EKG were unremarkable.  Patient given Ativan for anxiety with resolution in symptoms.  Since discharge from the ER, patient denies recurrence of tachycardia.  Does endorses persistently elevated BP despite lisinopril 10 mg daily.  Denies chest pain, lightheadedness, dizziness, frequent headache or blurry vision.  Patient is still smoking but is ready to quit.  Has attempted multiple OTC measures previously.  Was also previously given an Rx for Chantix but could not fill due to cost.  Past Medical History  Diagnosis Date  . Arthritis   . COPD (chronic obstructive pulmonary disease)     no history of PFTs, only diagnosed by CXR, not on any inhalers because she has not had a PCP in over 2 years  . Hypertension     previously on Lisinopril/HCTZ  . History of heartburn   . Hypercholesteremia     previously on pravastatin   . Cholelithiasis 2011    s/p cholescystectomy   . Renal cell carcinoma 01/2010    s/p right radical nephrectomy 01/2010,  followed [facility]  . Murmur     no  prior work-up  . GERD (gastroesophageal reflux disease)   . Emphysema 2010    dx'd/CXR  . Shortness of breath 09/26/11    "lately all the time"  . Pneumonia 1980's    "walking"    Current Outpatient Prescriptions on File Prior to Visit  Medication Sig Dispense Refill  . albuterol (PROVENTIL HFA;VENTOLIN HFA) 108 (90 BASE) MCG/ACT inhaler Inhale 2 puffs into the lungs every 6 (six) hours as needed for wheezing or shortness of breath.  1 Inhaler  1  . albuterol (PROVENTIL) (2.5 MG/3ML) 0.083% nebulizer solution Take 2.5 mg by nebulization every 6 (six) hours as needed for wheezing or shortness of breath.      . ALPRAZolam (XANAX) 1 MG tablet Take 1 mg by mouth at bedtime as needed for anxiety.       .  mometasone-formoterol (DULERA) 100-5 MCG/ACT AERO Inhale 2 puffs into the lungs 2 (two) times daily.  1 Inhaler  6  . predniSONE (DELTASONE) 20 MG tablet Take 1 tablet (20 mg total) by mouth daily with breakfast.  15 tablet  0  . zolpidem (AMBIEN) 5 MG tablet Take 1 tablet (5 mg total) by mouth at bedtime as needed for sleep.  30 tablet  0  . atorvastatin (LIPITOR) 20 MG tablet Take 1 tablet (20 mg total) by mouth daily.  90 tablet  3  . azithromycin (ZITHROMAX) 250 MG tablet 2 tab po day 1 then 1 tab po x 4 days  6 tablet  0   No current facility-administered medications on file prior to visit.    No Known Allergies  Family History  Problem Relation Age of Onset  . COPD Mother     DECEASED    History   Social History  . Marital Status: Married    Spouse Name: N/A    Number of Children: 3  . Years of Education: 12   Occupational History  . Multimedia programmer business   Social History Main Topics  . Smoking status: Current Some Day Smoker -- 1.00 packs/day for 50 years    Types: Cigarettes    Last Attempt to Quit: 01/14/2014  . Smokeless tobacco: Never Used  Comment: smoking cessation consult entered  . Alcohol Use: Yes     Comment:  drink very rarely"  . Drug Use: No     Comment: hx of cocaine last in 2006  . Sexual Activity: No   Other Topics Concern  . None   Social History Narrative   Lives in [place] with her husband.    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    Patient has 3 grown children that do not live with her.    Patient does not have any medical insurance.   Review of Systems - See HPI.  All other ROS are negative.  BP 162/107  Pulse 79  Temp(Src) 97.9 F (36.6 C)  Wt 142 lb (64.411 kg)  SpO2 99%  Physical Exam  Constitutional: She is oriented to person, place, and time and well-developed, well-nourished, and in no distress.  HENT:  Head: Normocephalic and atraumatic.  Right Ear: External  ear normal.  Left Ear: External ear normal.  Nose: Nose normal.  Mouth/Throat: Oropharynx is clear and moist. No oropharyngeal exudate.  Eyes: Conjunctivae and EOM are normal. Pupils are equal, round, and reactive to light.  Neck: Neck supple. No thyromegaly present.  Cardiovascular: Normal rate, regular rhythm, normal heart sounds and intact distal pulses.   Pulmonary/Chest: Effort normal and breath sounds normal.  Neurological: She is alert and oriented to person, place, and time.  Skin: Skin is warm and dry. No rash noted.  Psychiatric: Affect normal.   Recent Results (from the past 2160 hour(s))  CBC     Status: Abnormal   Collection Time    06/19/14  1:55 PM      Result Value Ref Range   WBC 11.6 (*) 4.0 - 10.5 K/uL   RBC 4.34  3.87 - 5.11 MIL/uL   Hemoglobin 12.9  12.0 - 15.0 g/dL   HCT 38.6  36.0 - 46.0 %   MCV 88.9  78.0 - 100.0 fL   MCH 29.7  26.0 - 34.0 pg   MCHC 33.4  30.0 - 36.0 g/dL   RDW 12.7  11.5 - 15.5 %   Platelets 327  150 - 400 K/uL  BASIC METABOLIC PANEL     Status: Abnormal   Collection Time    06/19/14  1:55 PM      Result Value Ref Range   Sodium 146  137 - 147 mEq/L   Potassium 3.3 (*) 3.7 - 5.3 mEq/L   Chloride 106  96 - 112 mEq/L   CO2 27  19 - 32 mEq/L   Glucose, Bld 89  70 - 99 mg/dL   BUN 21  6 - 23 mg/dL   Creatinine, Ser 1.29 (*) 0.50 - 1.10 mg/dL   Calcium 9.8  8.4 - 10.5 mg/dL   GFR calc non Af Amer 42 (*) >90 mL/min   GFR calc Af Amer 49 (*) >90 mL/min   Comment: (NOTE)     The eGFR has been calculated using the CKD EPI equation.     This calculation has not been validated in all clinical situations.     eGFR's persistently <90 mL/min signify possible Chronic Kidney     Disease.   Anion gap 13  5 - 15  I-STAT TROPOININ, ED     Status: None   Collection Time    06/19/14  2:22 PM      Result Value Ref Range   Troponin i, poc 0.01  0.00 - 0.08  ng/mL   Comment 3            Comment: Due to the release kinetics of cTnI,     a negative  result within the first hours     of the onset of symptoms does not rule out     myocardial infarction with certainty.     If myocardial infarction is still suspected,     repeat the test at appropriate intervals.    Assessment/Plan: HTN (hypertension) Increase lisinopril to 20 mg daily.  Follow-up in 2 weeks.  May need additional agent but would like to avoid this giving renal insufficiency and solitary kidney.  Tobacco abuse disorder Discussed treatment options.  Will Rx Wellbutrin 150 mg SR -- Take 1 tablet by mouth daily x 3 days.  Then increase to 1 tablet twice daily.  Follow-up in 3 weeks.

## 2014-06-27 NOTE — Patient Instructions (Signed)
Please increase your Lisinopril to 20 mg daily.  This will be two tablets of your current prescription.  When you pick up your new bottle it will be for 20 mg tablets.  Take one daily.  For the Wellbutrin -- take 1 tablet daily for 3 days.  Then increase to 1 tablet twice daily.    Follow-up in 3-4 weeks.  DASH Eating Plan DASH stands for "Dietary Approaches to Stop Hypertension." The DASH eating plan is a healthy eating plan that has been shown to reduce high blood pressure (hypertension). Additional health benefits may include reducing the risk of type 2 diabetes mellitus, heart disease, and stroke. The DASH eating plan may also help with weight loss. WHAT DO I NEED TO KNOW ABOUT THE DASH EATING PLAN? For the DASH eating plan, you will follow these general guidelines:  Choose foods with a percent daily value for sodium of less than 5% (as listed on the food label).  Use salt-free seasonings or herbs instead of table salt or sea salt.  Check with your health care provider or pharmacist before using salt substitutes.  Eat lower-sodium products, often labeled as "lower sodium" or "no salt added."  Eat fresh foods.  Eat more vegetables, fruits, and low-fat dairy products.  Choose whole grains. Look for the word "whole" as the first word in the ingredient list.  Choose fish and skinless chicken or [place] more often than red meat. Limit fish, poultry, and meat to 6 oz (170 g) each day.  Limit sweets, desserts, sugars, and sugary drinks.  Choose heart-healthy fats.  Limit cheese to 1 oz (28 g) per day.  Eat more home-cooked food and less restaurant, buffet, and fast food.  Limit fried foods.  Cook foods using methods other than frying.  Limit canned vegetables. If you do use them, rinse them well to decrease the sodium.  When eating at a restaurant, ask that your food be prepared with less salt, or no salt if possible. WHAT FOODS CAN I EAT? Seek help from a dietitian for  individual calorie needs. Grains Whole grain or whole wheat bread. Brown rice. Whole grain or whole wheat pasta. Quinoa, bulgur, and whole grain cereals. Low-sodium cereals. Corn or whole wheat flour tortillas. Whole grain cornbread. Whole grain crackers. Low-sodium crackers. Vegetables Fresh or frozen vegetables (raw, steamed, roasted, or grilled). Low-sodium or reduced-sodium tomato and vegetable juices. Low-sodium or reduced-sodium tomato sauce and paste. Low-sodium or reduced-sodium canned vegetables.  Fruits All fresh, canned (in natural juice), or frozen fruits. Meat and Other Protein Products Ground beef (85% or leaner), grass-fed beef, or beef trimmed of fat. Skinless chicken or [place]. Ground chicken or [place]. Pork trimmed of fat. All fish and seafood. Eggs. Dried beans, peas, or lentils. Unsalted nuts and seeds. Unsalted canned beans. Dairy Low-fat dairy products, such as skim or 1% milk, 2% or reduced-fat cheeses, low-fat ricotta or cottage cheese, or plain low-fat yogurt. Low-sodium or reduced-sodium cheeses. Fats and Oils Tub margarines without trans fats. Light or reduced-fat mayonnaise and salad dressings (reduced sodium). Avocado. Safflower, olive, or canola oils. Natural peanut or almond butter. Other Unsalted popcorn and pretzels. The items listed above may not be a complete list of recommended foods or beverages. Contact your dietitian for more options. WHAT FOODS ARE NOT RECOMMENDED? Grains White bread. White pasta. White rice. Refined cornbread. Bagels and croissants. Crackers that contain trans fat. Vegetables Creamed or fried vegetables. Vegetables in a cheese sauce. Regular canned vegetables. Regular canned tomato sauce and paste.  Regular tomato and vegetable juices. Fruits Dried fruits. Canned fruit in light or heavy syrup. Fruit juice. Meat and Other Protein Products Fatty cuts of meat. Ribs, chicken wings, bacon, sausage, bologna, salami, chitterlings, fatback, hot  dogs, bratwurst, and packaged luncheon meats. Salted nuts and seeds. Canned beans with salt. Dairy Whole or 2% milk, cream, half-and-half, and cream cheese. Whole-fat or sweetened yogurt. Full-fat cheeses or blue cheese. Nondairy creamers and whipped toppings. Processed cheese, cheese spreads, or cheese curds. Condiments Onion and garlic salt, seasoned salt, table salt, and sea salt. Canned and packaged gravies. Worcestershire sauce. Tartar sauce. Barbecue sauce. Teriyaki sauce. Soy sauce, including reduced sodium. Steak sauce. Fish sauce. Oyster sauce. Cocktail sauce. Horseradish. Ketchup and mustard. Meat flavorings and tenderizers. Bouillon cubes. Hot sauce. Tabasco sauce. Marinades. Taco seasonings. Relishes. Fats and Oils Butter, stick margarine, lard, shortening, ghee, and bacon fat. Coconut, palm kernel, or palm oils. Regular salad dressings. Other Pickles and olives. Salted popcorn and pretzels. The items listed above may not be a complete list of foods and beverages to avoid. Contact your dietitian for more information. WHERE CAN I FIND MORE INFORMATION? National Heart, Lung, and Blood Institute: travelstabloid.com Document Released: 09/25/2011 Document Revised: 02/20/2014 Document Reviewed: 08/10/2013 [facility] Patient Information 2015 [place], [place]. This information is not intended to replace advice given to you by your health care provider. Make sure you discuss any questions you have with your health care provider.

## 2014-06-27 NOTE — Progress Notes (Signed)
Pre visit review using our clinic review tool, if applicable. No additional management support is needed unless otherwise documented below in the visit note. 

## 2014-06-27 NOTE — Assessment & Plan Note (Signed)
Increase lisinopril to 20 mg daily.  Follow-up in 2 weeks.  May need additional agent but would like to avoid this giving renal insufficiency and solitary kidney.

## 2014-07-18 ENCOUNTER — Ambulatory Visit: Payer: Self-pay | Admitting: Physician Assistant

## 2014-07-19 ENCOUNTER — Ambulatory Visit: Payer: Medicare Other | Admitting: Physician Assistant

## 2014-07-19 DIAGNOSIS — Z0289 Encounter for other administrative examinations: Secondary | ICD-10-CM

## 2014-09-06 ENCOUNTER — Telehealth: Payer: Self-pay | Admitting: Physician Assistant

## 2014-09-06 MED ORDER — SIMVASTATIN 40 MG PO TABS: 40.0000 mg | ORAL_TABLET | Freq: Every day | ORAL | Status: DC

## 2014-09-06 NOTE — Addendum Note (Signed)
Addended by: [name] on: 09/06/2014 05:26 PM   Modules accepted: Orders

## 2014-09-06 NOTE — Telephone Encounter (Signed)
Simvastatin 40 mg tablet sent to pharmacy.  Patient to take as directed.  90-day supply sent.  I checked. It should be around 10 dollars for a 90-day supply.

## 2014-09-06 NOTE — Telephone Encounter (Signed)
Patient called in stating that lipitor rx is too expensive for her and would like something else called in.

## 2014-09-06 NOTE — Telephone Encounter (Signed)
Rx request to pharmacy/SLS  

## 2014-09-27 ENCOUNTER — Encounter (HOSPITAL_COMMUNITY): Payer: Self-pay | Admitting: Cardiology

## 2014-10-17 ENCOUNTER — Telehealth: Payer: Self-pay | Admitting: Physician Assistant

## 2014-10-17 ENCOUNTER — Other Ambulatory Visit: Payer: Self-pay | Admitting: Physician Assistant

## 2014-10-17 NOTE — Telephone Encounter (Signed)
Patient called in with Chest tightness and SOB. Patient did state that she does have COPD. I transferred call to [facility] and call was transferred back to me. Patient did not want to come in until next week. I talked to [place] and he states that patient needs to be seen today at one our [place] locations or go to the nearest ED. Patient states that "all she wants to do is schedule an appointment for next week". I informed patient that she needs to be seen somewhere today. Patient disconnected phone call.

## 2014-10-17 NOTE — Telephone Encounter (Signed)
You handled this the correct way. We cannot do much more if she is not compliant with our suggestions.

## 2014-10-18 ENCOUNTER — Other Ambulatory Visit: Payer: Self-pay | Admitting: *Deleted

## 2014-10-18 DIAGNOSIS — I10 Essential (primary) hypertension: Secondary | ICD-10-CM

## 2014-10-18 MED ORDER — LISINOPRIL 20 MG PO TABS: 20.0000 mg | ORAL_TABLET | Freq: Every day | ORAL | Status: DC

## 2014-10-18 NOTE — Telephone Encounter (Signed)
Lisinopril refilled for one month. OV due. JG//CMA

## 2014-11-17 ENCOUNTER — Telehealth: Payer: Self-pay | Admitting: *Deleted

## 2014-11-17 ENCOUNTER — Ambulatory Visit: Payer: Self-pay | Admitting: Physician Assistant

## 2014-11-17 NOTE — Telephone Encounter (Signed)
Yes

## 2014-11-17 NOTE — Telephone Encounter (Signed)
See note below

## 2014-11-17 NOTE — Telephone Encounter (Signed)
Pt did not show for appointment 11/17/14 at 10:15am for "flu shot- refused nurse only visit wants to follow up with cody / MH/confirmed 11/16/14".  Charge no show fee?

## 2014-12-31 ENCOUNTER — Observation Stay (HOSPITAL_COMMUNITY)
Admission: EM | Admit: 2014-12-31 | Discharge: 2015-01-02 | Disposition: A | Discharge status: Home / Self Care | Payer: Medicare Other | Attending: Internal Medicine | Admitting: Internal Medicine

## 2014-12-31 ENCOUNTER — Emergency Department (HOSPITAL_COMMUNITY): Payer: Medicare Other

## 2014-12-31 ENCOUNTER — Encounter (HOSPITAL_COMMUNITY): Payer: Self-pay | Admitting: Emergency Medicine

## 2014-12-31 DIAGNOSIS — E780 Pure hypercholesterolemia: Secondary | ICD-10-CM | POA: Diagnosis not present

## 2014-12-31 DIAGNOSIS — N189 Chronic kidney disease, unspecified: Secondary | ICD-10-CM | POA: Insufficient documentation

## 2014-12-31 DIAGNOSIS — J449 Chronic obstructive pulmonary disease, unspecified: Secondary | ICD-10-CM | POA: Diagnosis present

## 2014-12-31 DIAGNOSIS — I10 Essential (primary) hypertension: Secondary | ICD-10-CM

## 2014-12-31 DIAGNOSIS — Z7952 Long term (current) use of systemic steroids: Secondary | ICD-10-CM | POA: Diagnosis not present

## 2014-12-31 DIAGNOSIS — F419 Anxiety disorder, unspecified: Secondary | ICD-10-CM | POA: Insufficient documentation

## 2014-12-31 DIAGNOSIS — Z8701 Personal history of pneumonia (recurrent): Secondary | ICD-10-CM | POA: Diagnosis not present

## 2014-12-31 DIAGNOSIS — Z9049 Acquired absence of other specified parts of digestive tract: Secondary | ICD-10-CM | POA: Diagnosis not present

## 2014-12-31 DIAGNOSIS — R05 Cough: Secondary | ICD-10-CM | POA: Diagnosis not present

## 2014-12-31 DIAGNOSIS — Z9851 Tubal ligation status: Secondary | ICD-10-CM | POA: Insufficient documentation

## 2014-12-31 DIAGNOSIS — F17210 Nicotine dependence, cigarettes, uncomplicated: Secondary | ICD-10-CM | POA: Diagnosis not present

## 2014-12-31 DIAGNOSIS — I129 Hypertensive chronic kidney disease with stage 1 through stage 4 chronic kidney disease, or unspecified chronic kidney disease: Secondary | ICD-10-CM | POA: Insufficient documentation

## 2014-12-31 DIAGNOSIS — Z85528 Personal history of other malignant neoplasm of kidney: Secondary | ICD-10-CM | POA: Diagnosis not present

## 2014-12-31 DIAGNOSIS — J441 Chronic obstructive pulmonary disease with (acute) exacerbation: Secondary | ICD-10-CM | POA: Diagnosis not present

## 2014-12-31 DIAGNOSIS — R0602 Shortness of breath: Secondary | ICD-10-CM | POA: Diagnosis not present

## 2014-12-31 DIAGNOSIS — N179 Acute kidney failure, unspecified: Secondary | ICD-10-CM | POA: Insufficient documentation

## 2014-12-31 DIAGNOSIS — F329 Major depressive disorder, single episode, unspecified: Secondary | ICD-10-CM | POA: Insufficient documentation

## 2014-12-31 DIAGNOSIS — F1099 Alcohol use, unspecified with unspecified alcohol-induced disorder: Secondary | ICD-10-CM | POA: Diagnosis not present

## 2014-12-31 DIAGNOSIS — E785 Hyperlipidemia, unspecified: Secondary | ICD-10-CM

## 2014-12-31 DIAGNOSIS — Z716 Tobacco abuse counseling: Secondary | ICD-10-CM | POA: Diagnosis not present

## 2014-12-31 DIAGNOSIS — Z905 Acquired absence of kidney: Secondary | ICD-10-CM | POA: Diagnosis not present

## 2014-12-31 DIAGNOSIS — K219 Gastro-esophageal reflux disease without esophagitis: Secondary | ICD-10-CM | POA: Insufficient documentation

## 2014-12-31 DIAGNOSIS — F418 Other specified anxiety disorders: Secondary | ICD-10-CM | POA: Insufficient documentation

## 2014-12-31 DIAGNOSIS — N183 Chronic kidney disease, stage 3 (moderate): Secondary | ICD-10-CM

## 2014-12-31 LAB — CBC
HEMATOCRIT: 36.2 % (ref 36.0–46.0)
Hemoglobin: 12.1 g/dL (ref 12.0–15.0)
MCH: 29.3 pg (ref 26.0–34.0)
MCHC: 33.4 g/dL (ref 30.0–36.0)
MCV: 87.7 fL (ref 78.0–100.0)
Platelets: 285 10*3/uL (ref 150–400)
RBC: 4.13 MIL/uL (ref 3.87–5.11)
RDW: 12.9 % (ref 11.5–15.5)
WBC: 5.1 10*3/uL (ref 4.0–10.5)

## 2014-12-31 LAB — BASIC METABOLIC PANEL
ANION GAP: 8 (ref 5–15)
BUN: 15 mg/dL (ref 6–23)
CALCIUM: 9.6 mg/dL (ref 8.4–10.5)
CO2: 28 mmol/L (ref 19–32)
CREATININE: 1.51 mg/dL — AB (ref 0.50–1.10)
Chloride: 105 mmol/L (ref 96–112)
GFR calc non Af Amer: 35 mL/min — ABNORMAL LOW (ref >90)
GFR, EST AFRICAN AMERICAN: 40 mL/min — AB (ref >90)
Glucose, Bld: 95 mg/dL (ref 70–99)
Potassium: 3.8 mmol/L (ref 3.5–5.1)
Sodium: 141 mmol/L (ref 135–145)

## 2014-12-31 MED ORDER — INFLUENZA VAC SPLIT QUAD 0.5 ML IM SUSY
0.5000 mL | PREFILLED_SYRINGE | INTRAMUSCULAR | Status: AC
  Administered 2015-01-02: 0.5 mL via INTRAMUSCULAR
  Filled 2014-12-31 (×2): qty 0.5

## 2014-12-31 MED ORDER — ALBUTEROL (5 MG/ML) CONTINUOUS INHALATION SOLN
20.0000 mg/h | INHALATION_SOLUTION | Freq: Once | RESPIRATORY_TRACT | Status: AC
  Administered 2014-12-31: 20 mg/h via RESPIRATORY_TRACT
  Filled 2014-12-31 (×2): qty 20

## 2014-12-31 MED ORDER — IPRATROPIUM-ALBUTEROL 0.5-2.5 (3) MG/3ML IN SOLN
3.0000 mL | Freq: Once | RESPIRATORY_TRACT | Status: AC
  Administered 2014-12-31: 3 mL via RESPIRATORY_TRACT
  Filled 2014-12-31 (×2): qty 3

## 2014-12-31 MED ORDER — HEPARIN SODIUM (PORCINE) 5000 UNIT/ML IJ SOLN
5000.0000 [IU] | Freq: Three times a day (TID) | INTRAMUSCULAR | Status: DC
  Administered 2014-12-31 – 2015-01-02 (×5): 5000 [IU] via SUBCUTANEOUS
  Filled 2014-12-31 (×4): qty 1

## 2014-12-31 MED ORDER — LISINOPRIL 20 MG PO TABS: 20.0000 mg | ORAL_TABLET | Freq: Every day | ORAL | Status: DC

## 2014-12-31 MED ORDER — ALBUTEROL SULFATE HFA 108 (90 BASE) MCG/ACT IN AERS: 2.0000 | INHALATION_SPRAY | Freq: Four times a day (QID) | RESPIRATORY_TRACT | Status: DC | PRN

## 2014-12-31 MED ORDER — ALBUTEROL SULFATE (2.5 MG/3ML) 0.083% IN NEBU: 2.5000 mg | INHALATION_SOLUTION | Freq: Four times a day (QID) | RESPIRATORY_TRACT | Status: DC | PRN

## 2014-12-31 MED ORDER — PREDNISONE 20 MG PO TABS
60.0000 mg | ORAL_TABLET | Freq: Once | ORAL | Status: AC
  Administered 2014-12-31: 60 mg via ORAL
  Filled 2014-12-31: qty 3

## 2014-12-31 MED ORDER — ZOLPIDEM TARTRATE 5 MG PO TABS
5.0000 mg | ORAL_TABLET | Freq: Every evening | ORAL | Status: DC | PRN
Start: 2014-12-31 — End: 2015-01-02
  Administered 2014-12-31 – 2015-01-01 (×2): 5 mg via ORAL
  Filled 2014-12-31 (×2): qty 1

## 2014-12-31 MED ORDER — PREDNISONE 50 MG PO TABS: 50.0000 mg | ORAL_TABLET | Freq: Every day | ORAL | Status: DC

## 2014-12-31 MED ORDER — MOMETASONE FURO-FORMOTEROL FUM 100-5 MCG/ACT IN AERO
2.0000 | INHALATION_SPRAY | Freq: Two times a day (BID) | RESPIRATORY_TRACT | Status: DC
  Administered 2014-12-31: 2 via RESPIRATORY_TRACT
  Filled 2014-12-31: qty 8.8

## 2014-12-31 NOTE — ED Notes (Signed)
Pt in X ray

## 2014-12-31 NOTE — ED Notes (Signed)
Pt c/o shortness of breath x couple weeks. Pt reports that she has been moving recently and having to do it on her own. Pt also reports that her husband died a year ago and feels everything is catching up with her. Pt denies SI/HI.

## 2014-12-31 NOTE — ED Provider Notes (Signed)
Patient seen/examined in the Emergency Department in conjunction with Resident Physician Provider  Patient reports cough/shortness of breath Exam : awake/alert, no distress, wheezing bilaterally Plan: will monitor in ER and reassess   Ripley Fraise, MD 12/31/14 1935

## 2014-12-31 NOTE — ED Notes (Signed)
MD at bedside. 

## 2014-12-31 NOTE — ED Provider Notes (Signed)
CSN: [id number]     Arrival date & time 12/31/14  1640 History   First MD Initiated Contact with Patient 12/31/14 1753     Chief Complaint  Patient presents with  . Shortness of Breath     (Consider location/radiation/quality/duration/timing/severity/associated sxs/prior Treatment) Patient is a 69 y.o. female presenting with shortness of breath. The history is provided by the patient.  Shortness of Breath Severity:  Moderate Onset quality:  Gradual Duration:  3 days Timing:  Constant Progression:  Worsening Chronicity:  Recurrent Context: activity   Relieved by:  Nothing Worsened by:  Activity Ineffective treatments:  Inhaler Associated symptoms: cough   Associated symptoms: no abdominal pain, no chest pain, no fever, no rash and no vomiting   Cough:    Cough characteristics:  Productive   Sputum characteristics:  Clear   Severity:  Mild   Onset quality:  Gradual   Duration:  3 days   Timing:  Constant   Progression:  Unchanged   Chronicity:  New Risk factors: no hx of cancer, no hx of PE/DVT, no obesity, no oral contraceptive use, no prolonged immobilization and no recent surgery     Past Medical History  Diagnosis Date  . Arthritis   . COPD (chronic obstructive pulmonary disease)     no history of PFTs, only diagnosed by CXR, not on any inhalers because she has not had a PCP in over 2 years  . Hypertension     previously on Lisinopril/HCTZ  . History of heartburn   . Hypercholesteremia     previously on pravastatin   . Cholelithiasis 2011    s/p cholescystectomy   . Renal cell carcinoma 01/2010    s/p right radical nephrectomy 01/2010,  follow[facility]  . Murmur     no  prior work-up  . GERD (gastroesophageal reflux disease)   . Emphysema 2010    dx'd/CXR  . Shortness of breath 09/26/11    "lately all the time"  . Pneumonia 1980's    "walking"   Past Surgical History  Procedure Laterality Date  . Cholecystectomy  01/2010  . Nephrectomy  radical  01/2010    right   . Tubal ligation  1970's  . Diagnostic laparoscopy    . Ovarian cyst removal  1970's    "went through belly button"  . Appendectomy  1970's  . Tee without cardioversion  10/01/2011    Procedure: TRANSESOPHAGEAL ECHOCARDIOGRAM (TEE);  Surgeon: [name];  Location: [place];  Service: Cardiovascular;  Laterality: N/A;  . US echocardiography  09/2011  . Cardiac catheterization  09/2011  . Multiple extractions with alveoloplasty  10/06/2011    Procedure: MULTIPLE EXTRACION WITH ALVEOLOPLASTY;  Surgeon: Lenn Cal, DDS;  Location: [place];  Service: Oral Surgery;  Laterality: N/A;  Multiple extraction of tooth #'s 1, 6, 8, 10, 11, 22, 23, 26, 27, 28, 29 with alveoloplasty and Upper right buccal exostoses reductions.  . Left and right heart catheterization with coronary angiogram N/A 09/30/2011    Procedure: LEFT AND RIGHT HEART CATHETERIZATION WITH CORONARY ANGIOGRAM;  Surgeon: Candee Furbish, MD;  Location: [facility] CATH LAB;  Service: Cardiovascular;  Laterality: N/A;   Family History  Problem Relation Age of Onset  . COPD Mother     DECEASED   History  Substance Use Topics  . Smoking status: Current Every Day Smoker -- 1.00 packs/day for 50 years    Types: Cigarettes    Last Attempt to Quit: 01/14/2014  . Smokeless tobacco:  Never Used     Comment: smoking cessation consult entered  . Alcohol Use: Yes     Comment:  drink very rarely"   OB History    No data available     Review of Systems  Constitutional: Negative for fever and chills.  HENT: Negative for nosebleeds.   Eyes: Negative for visual disturbance.  Respiratory: Positive for cough and shortness of breath.   Cardiovascular: Negative for chest pain.  Gastrointestinal: Negative for nausea, vomiting, abdominal pain, diarrhea and constipation.  Genitourinary: Negative for dysuria.  Skin: Negative for rash.  Neurological: Negative for weakness.  All other systems reviewed and are  negative.     Allergies  Review of patient's allergies indicates no known allergies.  Home Medications   Prior to Admission medications   Medication Sig Start Date End Date Taking? Authorizing Provider  albuterol (PROVENTIL HFA;VENTOLIN HFA) 108 (90 BASE) MCG/ACT inhaler Inhale 2 puffs into the lungs every 6 (six) hours as needed for wheezing or shortness of breath. 06/12/14   Kathryn, PA-C  albuterol (PROVENTIL) (2.5 MG/3ML) 0.083% nebulizer solution Take 2.5 mg by nebulization every 6 (six) hours as needed for wheezing or shortness of breath.    Historical Provider, MD  ALPRAZolam Duanne Moron) 1 MG tablet Take 1 mg by mouth at bedtime as needed for anxiety.  01/04/14   Brunetta Jeans, PA-C  azithromycin (ZITHROMAX) 250 MG tablet 2 tab po day 1 then 1 tab po x 4 days 06/12/14   Meriam Sprague Saguier, PA-C  buPROPion [employer] SR) 150 MG 12 hr tablet Take 1 tablet by mouth for 3 days.  Then increase to 1 tablet twice daily. 06/27/14   Brunetta Jeans, PA-C  lisinopril (PRINIVIL,ZESTRIL) 20 MG tablet Take 1 tablet (20 mg total) by mouth daily. 10/18/14   Brunetta Jeans, PA-C  mometasone-formoterol (DULERA) 100-5 MCG/ACT AERO Inhale 2 puffs into the lungs 2 (two) times daily. 01/19/14   Bobby Rumpf York, PA-C  predniSONE (DELTASONE) 20 MG tablet Take 1 tablet (20 mg total) by mouth daily with breakfast. 06/12/14   Meriam Sprague Saguier, PA-C  simvastatin (ZOCOR) 40 MG tablet Take 1 tablet (40 mg total) by mouth at bedtime. 09/06/14   Brunetta Jeans, PA-C  zolpidem (AMBIEN) 5 MG tablet Take 1 tablet (5 mg total) by mouth at bedtime as needed for sleep. 01/19/14   Marianne L York, PA-C   BP 136/87 mmHg  Pulse 96  Temp(Src) 98.4 F (36.9 C) (Oral)  Resp 16  Ht 5\' 1"  (1.549 m)  Wt 145 lb (65.772 kg)  BMI 27.41 kg/m2  SpO2 97% Physical Exam  Constitutional: She is oriented to person, place, and time. No distress.  HENT:  Head: Normocephalic and atraumatic.  Eyes: EOM are normal. Pupils are  equal, round, and reactive to light.  Neck: Normal range of motion. Neck supple.  Cardiovascular: Normal rate and intact distal pulses.   Pulmonary/Chest: No respiratory distress. She has wheezes (insp and expiratory).  Mildly increased WOB  Abdominal: Soft. There is no tenderness.  Musculoskeletal: Normal range of motion.  Neurological: She is alert and oriented to person, place, and time.  Skin: No rash noted. She is not diaphoretic.  Psychiatric: She has a normal mood and affect.    ED Course  Procedures (including critical care time) Labs Review Labs Reviewed  CBC  BASIC METABOLIC PANEL    Imaging Review No results found.   EKG Interpretation None      MDM  Final diagnoses:  None    69 y/o female w/ PMH copd.  No hx of CHF or CAD.  Also hx of renal cell carcinoma which isn't active.  She presents with 3 d of cough/shortness of breath.  No chest pain.  She notes that over the past few days she has been moving, doing a significant amount of exercise.  She has also been having cold sx of cough, no fevers.  Her shortness of breath has gradually worsened and she feels she has been wheezing.  She has noticed no LE edema.  No prev dvt/pe.  Exam as above.  Patient has mild tachypnea.  satting well on RA.  Wheezes bilaterally w/ mild increased WOB  She has no chest pain, the first sx was cough, doubt ACS.  Doubt PE given patient w/ hx of COPD and this SOB seems to have started w/ cold sx.  Also, she is satting well on RA and isn't tachypneic, no leg swelling.  Will obtain basic labs, give nebs/pred and continue to monitor. Basic labs unremarkable.  CXR obtained and shows no infiltrate.  EKG obtained and w/o ischemic changes.  After 1 hour continuous, patient notes she is feeling a little better.  I went to walk her and she was significantly dyspneic on exertion.  Will admit to hosp service for COPD exacerbation.    Jarome Matin, MD 01/01/15 7026  Jarome Matin,  MD 01/01/15 3785  Ripley Fraise, MD 01/01/15 2250

## 2014-12-31 NOTE — ED Notes (Signed)
Dr. Christy Gentles advised patient can eat at this time.

## 2014-12-31 NOTE — H&P (Signed)
[facility] History and Physical  Katie [name] QQI:[id number] DOB: 1946/06/10 DOA: 12/31/2014  Referring physician: EDP PCP: [name], PA-C   Chief Complaint: SOB   HPI: Katie [name] is a 69 y.o. female with h/o COPD.  She has been moving houses over the past couple of days and for the past 3 days she has had worsening SOB, wheezing, cough productive of clear sputum.  Symptoms worse with activity, not made better with inhaler.  Symptoms are constant and moderate in severity.  Review of Systems: Systems reviewed.  As above, otherwise negative  Past Medical History  Diagnosis Date  . Arthritis   . COPD (chronic obstructive pulmonary disease)     no history of PFTs, only diagnosed by CXR, not on any inhalers because she has not had a PCP in over 2 years  . Hypertension     previously on Lisinopril/HCTZ  . History of heartburn   . Hypercholesteremia     previously on pravastatin   . Cholelithiasis 2011    s/p cholescystectomy   . Renal cell carcinoma 01/2010    s/p right radical nephrectomy 01/2010,  followed by [facility]  . Murmur     no  prior work-up  . GERD (gastroesophageal reflux disease)   . Emphysema 2010    dx'd/CXR  . Shortness of breath 09/26/11    "lately all the time"  . Pneumonia 1980's    "walking"   Past Surgical History  Procedure Laterality Date  . Cholecystectomy  01/2010  . Nephrectomy radical  01/2010    right   . Tubal ligation  1970's  . Diagnostic laparoscopy    . Ovarian cyst removal  1970's    "went through belly button"  . Appendectomy  1970's  . Tee without cardioversion  10/01/2011    Procedure: TRANSESOPHAGEAL ECHOCARDIOGRAM (TEE);  Surgeon: [name];  Location: [place];  Service: Cardiovascular;  Laterality: N/A;  . US echocardiography  09/2011  . Cardiac catheterization  09/2011  . Multiple extractions with alveoloplasty  10/06/2011    Procedure: MULTIPLE EXTRACION WITH ALVEOLOPLASTY;   Surgeon: Lenn Cal, DDS;  Location: [place];  Service: Oral Surgery;  Laterality: N/A;  Multiple extraction of tooth #'s 1, 6, 8, 10, 11, 22, 23, 26, 27, 28, 29 with alveoloplasty and Upper right buccal exostoses reductions.  . Left and right heart catheterization with coronary angiogram N/A 09/30/2011    Procedure: LEFT AND RIGHT HEART CATHETERIZATION WITH CORONARY ANGIOGRAM;  Surgeon: Candee Furbish, MD;  Location: [facility] CATH LAB;  Service: Cardiovascular;  Laterality: N/A;   Social History:  reports that she has been smoking Cigarettes.  She has a 50 pack-year smoking history. She has never used smokeless tobacco. She reports that she drinks alcohol. She reports that she does not use illicit drugs.  No Known Allergies  Family History  Problem Relation Age of Onset  . COPD Mother     DECEASED     Prior to Admission medications   Medication Sig Start Date End Date Taking? Authorizing Provider  albuterol (PROVENTIL HFA;VENTOLIN HFA) 108 (90 BASE) MCG/ACT inhaler Inhale 2 puffs into the lungs every 6 (six) hours as needed for wheezing or shortness of breath. 06/12/14  Yes Meriam Sprague Saguier, PA-C  azithromycin (ZITHROMAX) 250 MG tablet 2 tab po day 1 then 1 tab po x 4 days 06/12/14   Meriam Sprague Saguier, PA-C  buPROPion [facility] SR) 150 MG 12 hr tablet Take 1 tablet by mouth for 3  days.  Then increase to 1 tablet twice daily. 06/27/14   Brunetta Jeans, PA-C  lisinopril (PRINIVIL,ZESTRIL) 20 MG tablet Take 1 tablet (20 mg total) by mouth daily. 10/18/14  Yes Brunetta Jeans, PA-C  mometasone-formoterol (DULERA) 100-5 MCG/ACT AERO Inhale 2 puffs into the lungs 2 (two) times daily. 01/19/14  Yes Bobby Rumpf York, PA-C  predniSONE (DELTASONE) 20 MG tablet Take 1 tablet (20 mg total) by mouth daily with breakfast. 06/12/14  Yes Meriam Sprague Saguier, PA-C  simvastatin (ZOCOR) 40 MG tablet Take 1 tablet (40 mg total) by mouth at bedtime. 09/06/14   Brunetta Jeans, PA-C  zolpidem (AMBIEN) 5 MG tablet Take 1  tablet (5 mg total) by mouth at bedtime as needed for sleep. 01/19/14  Yes Melton Alar, PA-C   Physical Exam: Filed Vitals:   12/31/14 2130  BP: 104/59  Pulse: 107  Temp:   Resp: 21    BP 104/59 mmHg  Pulse 107  Temp(Src) 98.4 F (36.9 C) (Oral)  Resp 21  Ht 5\' 1"  (1.549 m)  Wt 65.772 kg (145 lb)  BMI 27.41 kg/m2  SpO2 95%  General Appearance:    Alert, oriented, no distress, appears stated age  Head:    Normocephalic, atraumatic  Eyes:    PERRL, EOMI, sclera non-icteric        Nose:   Nares without drainage or epistaxis. Mucosa, turbinates normal  Throat:   Moist mucous membranes. Oropharynx without erythema or exudate.  Neck:   Supple. No carotid bruits.  No thyromegaly.  No lymphadenopathy.   Back:     No CVA tenderness, no spinal tenderness  Lungs:     Bilateral wheezes and rales  Chest wall:    No tenderness to palpitation  Heart:    Tachycardic, regular, without murmurs, gallops, rubs  Abdomen:     Soft, non-tender, nondistended, normal bowel sounds, no organomegaly  Genitalia:    deferred  Rectal:    deferred  Extremities:   No clubbing, cyanosis or edema.  Pulses:   2+ and symmetric all extremities  Skin:   Skin color, texture, turgor normal, no rashes or lesions  Lymph nodes:   Cervical, supraclavicular, and axillary nodes normal  Neurologic:   CNII-XII intact. Normal strength, sensation and reflexes      throughout    Labs on Admission:  Basic Metabolic Panel:  Recent Labs Lab 12/31/14 1657  NA 141  K 3.8  CL 105  CO2 28  GLUCOSE 95  BUN 15  CREATININE 1.51*  CALCIUM 9.6   Liver Function Tests: No results for input(s): AST, ALT, ALKPHOS, BILITOT, PROT, ALBUMIN in the last 168 hours. No results for input(s): LIPASE, AMYLASE in the last 168 hours. No results for input(s): AMMONIA in the last 168 hours. CBC:  Recent Labs Lab 12/31/14 1657  WBC 5.1  HGB 12.1  HCT 36.2  MCV 87.7  PLT 285   Cardiac Enzymes: No results for input(s):  CKTOTAL, CKMB, CKMBINDEX, TROPONINI in the last 168 hours.  BNP (last 3 results) No results for input(s): PROBNP in the last 8760 hours. CBG: No results for input(s): GLUCAP in the last 168 hours.  Radiological Exams on Admission: Dg Chest 2 View (if Patient Has Fever And/or Copd)  12/31/2014   CLINICAL DATA:  Shortness of breath. Productive cough and wheezing for 2 weeks. Cardiac murmur. Tobacco use for 52 years.  EXAM: CHEST  2 VIEW  COMPARISON:  06/19/2014  FINDINGS: Levoconvex lower thoracic scoliosis.  Cardiac and mediastinal margins appear normal. Mild interstitial accentuation, as can commonly be encountered in smokers. Linear subsegmental atelectasis at the left lung base.  IMPRESSION: 1. Linear subsegmental atelectasis at the left lung base. 2. Mild interstitial accentuation, as can commonly be encountered in smokers. 3. Levoconvex thoracic scoliosis. 4. Low-dose CT lung cancer screening is recommended for patients who are 36-40 years of age with a 30+ pack-year history of smoking, and who are currently smoking or quit <=15 years ago. Based on demographics and history, this patient would seem to be a candidate.   Electronically Signed   By: Van Clines M.D.   On: 12/31/2014 18:14    EKG: Independently reviewed.  Assessment/Plan Principal Problem:   COPD exacerbation Active Problems:   COPD (chronic obstructive pulmonary disease)   HTN (hypertension)   HLD (hyperlipidemia)   1. COPD exacerbation - 1. Prednisone daily 2. Adult wheeze protocol for neb treatments 3. Just completed a course of azithromycin without benefit, will hold off on further ABx given absence of obvious infection. 2. HTN - continue home BP meds 3. HLD - continue statin    Code Status: Full Code  Family Communication: Family at bedside Disposition Plan: Admit to obs   Time spent: 70 min  GARDNER, JARED M. [facility] Pager [phone]  If 7AM-7PM, please contact the day team taking care of  the patient [email or web address] Password [facility] 12/31/2014, 10:25 PM

## 2015-01-01 DIAGNOSIS — E785 Hyperlipidemia, unspecified: Secondary | ICD-10-CM | POA: Diagnosis not present

## 2015-01-01 DIAGNOSIS — F418 Other specified anxiety disorders: Secondary | ICD-10-CM | POA: Diagnosis not present

## 2015-01-01 DIAGNOSIS — J441 Chronic obstructive pulmonary disease with (acute) exacerbation: Secondary | ICD-10-CM | POA: Diagnosis not present

## 2015-01-01 DIAGNOSIS — Z720 Tobacco use: Secondary | ICD-10-CM | POA: Diagnosis not present

## 2015-01-01 DIAGNOSIS — I10 Essential (primary) hypertension: Secondary | ICD-10-CM | POA: Diagnosis not present

## 2015-01-01 LAB — INFLUENZA PANEL BY PCR (TYPE A & B)
H1N1FLUPCR: NOT DETECTED
INFLAPCR: NEGATIVE
INFLBPCR: NEGATIVE

## 2015-01-01 MED ORDER — BUPROPION HCL 75 MG PO TABS
75.0000 mg | ORAL_TABLET | Freq: Two times a day (BID) | ORAL | Status: DC
  Administered 2015-01-01 – 2015-01-02 (×2): 75 mg via ORAL
  Filled 2015-01-01 (×3): qty 1

## 2015-01-01 MED ORDER — DM-GUAIFENESIN ER 30-600 MG PO TB12
1.0000 | ORAL_TABLET | Freq: Two times a day (BID) | ORAL | Status: DC
  Administered 2015-01-01 – 2015-01-02 (×2): 1 via ORAL
  Filled 2015-01-01 (×2): qty 1

## 2015-01-01 MED ORDER — BUDESONIDE 0.25 MG/2ML IN SUSP
0.2500 mg | Freq: Two times a day (BID) | RESPIRATORY_TRACT | Status: DC
  Administered 2015-01-01 – 2015-01-02 (×3): 0.25 mg via RESPIRATORY_TRACT
  Filled 2015-01-01 (×3): qty 2

## 2015-01-01 MED ORDER — NICOTINE 14 MG/24HR TD PT24
14.0000 mg | MEDICATED_PATCH | Freq: Every day | TRANSDERMAL | Status: DC
  Administered 2015-01-01 – 2015-01-02 (×2): 14 mg via TRANSDERMAL
  Filled 2015-01-01 (×2): qty 1

## 2015-01-01 MED ORDER — SODIUM CHLORIDE 0.9 % IV SOLN
INTRAVENOUS | Status: AC
  Administered 2015-01-01 – 2015-01-02 (×2): via INTRAVENOUS

## 2015-01-01 MED ORDER — HYDRALAZINE HCL 20 MG/ML IJ SOLN: 10.0000 mg | Freq: Four times a day (QID) | INTRAMUSCULAR | Status: DC | PRN

## 2015-01-01 MED ORDER — PANTOPRAZOLE SODIUM 40 MG PO TBEC
40.0000 mg | DELAYED_RELEASE_TABLET | Freq: Every day | ORAL | Status: DC
Start: 2015-01-01 — End: 2015-01-02
  Administered 2015-01-01 – 2015-01-02 (×2): 40 mg via ORAL
  Filled 2015-01-01 (×2): qty 1

## 2015-01-01 MED ORDER — PREDNISONE 50 MG PO TABS
50.0000 mg | ORAL_TABLET | Freq: Two times a day (BID) | ORAL | Status: DC
  Administered 2015-01-01 – 2015-01-02 (×3): 50 mg via ORAL
  Filled 2015-01-01 (×4): qty 1

## 2015-01-01 MED ORDER — IPRATROPIUM-ALBUTEROL 0.5-2.5 (3) MG/3ML IN SOLN
3.0000 mL | Freq: Four times a day (QID) | RESPIRATORY_TRACT | Status: DC
  Administered 2015-01-01 – 2015-01-02 (×5): 3 mL via RESPIRATORY_TRACT
  Filled 2015-01-01 (×5): qty 3

## 2015-01-01 NOTE — Progress Notes (Signed)
UR completed 

## 2015-01-01 NOTE — Clinical Social Work Note (Signed)
CSW Consult Acknowledged:   CSW received a consult with no specfic identified need. CSW will sign off. Please reconsult with specified needs.    [place], MSW, [place]

## 2015-01-01 NOTE — Progress Notes (Addendum)
[facility] PROGRESS NOTE  Katie [name] QQV:[id number] DOB: [birth year] DOA: 12/31/2014 PCP: [name], PA-C  Assessment/Plan: 1-acute exacerbation of COPD: most likely due to bronchitis. Patient just finish zithromax therapy as an outpatient. No fever. -continue steroids -continue nebulizer therapy -started on pulmicort -continue PRN antitussives  -influenza by PCR neg  2-acute on chronic kidney injury -most likely pre-renal -will continue IVF's -no nephrotoxic agents -follow BMET in am  3-essential HTN: stable and well controlled -continue current antihypertensive agents  4-HLD: continue statins   5-depression/anxiety: continue Wellbutrin   6-tobacco abuse: cessation counseling provided -started on nicotine patch  Code Status: Full Family Communication: no family at bedside Disposition Plan: to be determine   Consultants:  None   Procedures:  See below for x-ray reports   Antibiotics:  None   HPI/Subjective: Feeling slightly better. No fever, no CP. Still SOB and with diffuse wheezing.  Objective: Filed Vitals:   01/01/15 1408  BP: 94/58  Pulse: 110  Temp: 99.1 F (37.3 C)  Resp: 20   No intake or output data in the 24 hours ending 01/01/15 1738 Filed Weights   12/31/14 1658 12/31/14 2310  Weight: 65.772 kg (145 lb) 66.225 kg (146 lb)    Exam:   General:  Slightly better, but still easily SOB and with exp wheezing  Cardiovascular: S1 and S2, no rubs or gallops  Respiratory: scattered rhonchi, positive diffuse wheezing, no use of accessory muscles  Abdomen: soft, NT, ND, positive BS  Musculoskeletal: no edema or cyanosis   Data Reviewed: Basic Metabolic Panel:  Recent Labs Lab 12/31/14 1657  NA 141  K 3.8  CL 105  CO2 28  GLUCOSE 95  BUN 15  CREATININE 1.51*  CALCIUM 9.6   CBC:  Recent Labs Lab 12/31/14 1657  WBC 5.1  HGB 12.1  HCT 36.2  MCV 87.7  PLT 285   Studies: Dg Chest 2 View (if Patient  Has Fever And/or Copd)  12/31/2014   CLINICAL DATA:  Shortness of breath. Productive cough and wheezing for 2 weeks. Cardiac murmur. Tobacco use for 52 years.  EXAM: CHEST  2 VIEW  COMPARISON:  06/19/2014  FINDINGS: Levoconvex lower thoracic scoliosis. Cardiac and mediastinal margins appear normal. Mild interstitial accentuation, as can commonly be encountered in smokers. Linear subsegmental atelectasis at the left lung base.  IMPRESSION: 1. Linear subsegmental atelectasis at the left lung base. 2. Mild interstitial accentuation, as can commonly be encountered in smokers. 3. Levoconvex thoracic scoliosis. 4. Low-dose CT lung cancer screening is recommended for patients who are 75-75 years of age with a 30+ pack-year history of smoking, and who are currently smoking or quit <=15 years ago. Based on demographics and history, this patient would seem to be a candidate.   Electronically Signed   By: Van Clines M.D.   On: 12/31/2014 18:14    Scheduled Meds: . budesonide (PULMICORT) nebulizer solution  0.25 mg Nebulization BID  . heparin  5,000 Units Subcutaneous 3 times per day  . Influenza vac split quadrivalent PF  0.5 mL Intramuscular Tomorrow-1000  . ipratropium-albuterol  3 mL Nebulization QID  . pantoprazole  40 mg Oral Q1200  . predniSONE  50 mg Oral BID WC   Continuous Infusions: . sodium chloride 75 mL/hr at 01/01/15 1301    Principal Problem:   COPD exacerbation Active Problems:   COPD (chronic obstructive pulmonary disease)   HTN (hypertension)   HLD (hyperlipidemia)    Time spent: 30 minutes    Dyann Kief,  [facility]  [facility] Pager [phone]. If 7PM-7AM, please contact night-coverage at [email or web address], password [facility] 01/01/2015, 5:38 PM

## 2015-01-01 NOTE — Progress Notes (Signed)
Talked to patient about difficulty affording her medication; patient has insurance with Medicare part A and B but does not have part D- prescription drug coverage. Patient stated that she will contact her brother in law to help her sign up for part D. CM encouraged patient to stop smoking also; Katie [name] [phone]

## 2015-01-02 DIAGNOSIS — J441 Chronic obstructive pulmonary disease with (acute) exacerbation: Secondary | ICD-10-CM | POA: Diagnosis not present

## 2015-01-02 DIAGNOSIS — N183 Chronic kidney disease, stage 3 (moderate): Secondary | ICD-10-CM

## 2015-01-02 DIAGNOSIS — N179 Acute kidney failure, unspecified: Secondary | ICD-10-CM | POA: Diagnosis not present

## 2015-01-02 DIAGNOSIS — E785 Hyperlipidemia, unspecified: Secondary | ICD-10-CM | POA: Diagnosis not present

## 2015-01-02 DIAGNOSIS — K219 Gastro-esophageal reflux disease without esophagitis: Secondary | ICD-10-CM | POA: Diagnosis not present

## 2015-01-02 DIAGNOSIS — F418 Other specified anxiety disorders: Secondary | ICD-10-CM | POA: Diagnosis not present

## 2015-01-02 DIAGNOSIS — I10 Essential (primary) hypertension: Secondary | ICD-10-CM | POA: Diagnosis not present

## 2015-01-02 LAB — BASIC METABOLIC PANEL
Anion gap: 7 (ref 5–15)
BUN: 23 mg/dL (ref 6–23)
CHLORIDE: 114 mmol/L — AB (ref 96–112)
CO2: 22 mmol/L (ref 19–32)
Calcium: 8.9 mg/dL (ref 8.4–10.5)
Creatinine, Ser: 1.4 mg/dL — ABNORMAL HIGH (ref 0.50–1.10)
GFR calc non Af Amer: 38 mL/min — ABNORMAL LOW (ref >90)
GFR, EST AFRICAN AMERICAN: 44 mL/min — AB (ref >90)
Glucose, Bld: 141 mg/dL — ABNORMAL HIGH (ref 70–99)
POTASSIUM: 4 mmol/L (ref 3.5–5.1)
Sodium: 143 mmol/L (ref 135–145)

## 2015-01-02 MED ORDER — BUPROPION HCL 75 MG PO TABS: 75.0000 mg | ORAL_TABLET | Freq: Two times a day (BID) | ORAL | Status: DC

## 2015-01-02 MED ORDER — PREDNISONE 20 MG PO TABS: ORAL_TABLET | ORAL | Status: DC

## 2015-01-02 MED ORDER — PANTOPRAZOLE SODIUM 40 MG PO TBEC: 40.0000 mg | DELAYED_RELEASE_TABLET | Freq: Every day | ORAL | Status: DC

## 2015-01-02 MED ORDER — LOSARTAN POTASSIUM-HCTZ 100-12.5 MG PO TABS: 1.0000 | ORAL_TABLET | Freq: Every day | ORAL | Status: DC

## 2015-01-02 MED ORDER — NICOTINE 14 MG/24HR TD PT24: 14.0000 mg | MEDICATED_PATCH | Freq: Every day | TRANSDERMAL | Status: DC

## 2015-01-02 MED ORDER — BUDESONIDE-FORMOTEROL FUMARATE 160-4.5 MCG/ACT IN AERO: 2.0000 | INHALATION_SPRAY | Freq: Two times a day (BID) | RESPIRATORY_TRACT | Status: DC

## 2015-01-02 MED ORDER — IPRATROPIUM-ALBUTEROL 0.5-2.5 (3) MG/3ML IN SOLN: 3.0000 mL | Freq: Four times a day (QID) | RESPIRATORY_TRACT | Status: DC

## 2015-01-02 MED ORDER — IPRATROPIUM-ALBUTEROL 20-100 MCG/ACT IN AERS: 1.0000 | INHALATION_SPRAY | Freq: Four times a day (QID) | RESPIRATORY_TRACT | Status: DC | PRN

## 2015-01-02 MED ORDER — DM-GUAIFENESIN ER 30-600 MG PO TB12: 1.0000 | ORAL_TABLET | Freq: Two times a day (BID) | ORAL | Status: DC

## 2015-01-02 NOTE — Progress Notes (Signed)
Pt d/c to home by car with family. Assessment stable. Prescriptions given. Pt verbalizes understanding of d/c instructions.

## 2015-01-02 NOTE — Discharge Instructions (Signed)
Low-dose CT lung cancer screening is recommended for patients who are 87-67 years of age with a 30+ pack-year history of smoking, and who are currently smoking or quit <=15 years ago. Based on demographics and history, this patient would seem to be a candidateChronic Obstructive Pulmonary Disease Chronic obstructive pulmonary disease (COPD) is a common lung condition in which airflow from the lungs is limited. COPD is a general term that can be used to describe many different lung problems that limit airflow, including both chronic bronchitis and emphysema. If you have COPD, your lung function will probably never return to normal, but there are measures you can take to improve lung function and make yourself feel better.  CAUSES   Smoking (common).   Exposure to secondhand smoke.   Genetic problems.  Chronic inflammatory lung diseases or recurrent infections. SYMPTOMS   Shortness of breath, especially with physical activity.   Deep, persistent (chronic) cough with a large amount of thick mucus.   Wheezing.   Rapid breaths (tachypnea).   Gray or bluish discoloration (cyanosis) of the skin, especially in fingers, toes, or lips.   Fatigue.   Weight loss.   Frequent infections or episodes when breathing symptoms become much worse (exacerbations).   Chest tightness. DIAGNOSIS  Your health care provider will take a medical history and perform a physical examination to make the initial diagnosis. Additional tests for COPD may include:   Lung (pulmonary) function tests.  Chest X-ray.  CT scan.  Blood tests. TREATMENT  Treatment available to help you feel better when you have COPD includes:   Inhaler and nebulizer medicines. These help manage the symptoms of COPD and make your breathing more comfortable.  Supplemental oxygen. Supplemental oxygen is only helpful if you have a low oxygen level in your blood.   Exercise and physical activity. These are beneficial  for nearly all people with COPD. Some people may also benefit from a pulmonary rehabilitation program. HOME CARE INSTRUCTIONS   Take all medicines (inhaled or pills) as directed by your health care provider.  Avoid over-the-counter medicines or cough syrups that dry up your airway (such as antihistamines) and slow down the elimination of secretions unless instructed otherwise by your health care provider.   If you are a smoker, the most important thing that you can do is stop smoking. Continuing to smoke will cause further lung damage and breathing trouble. Ask your health care provider for help with quitting smoking. He or she can direct you to community resources or hospitals that provide support.  Avoid exposure to irritants such as smoke, chemicals, and fumes that aggravate your breathing.  Use oxygen therapy and pulmonary rehabilitation if directed by your health care provider. If you require home oxygen therapy, ask your health care provider whether you should purchase a pulse oximeter to measure your oxygen level at home.   Avoid contact with individuals who have a contagious illness.  Avoid extreme temperature and humidity changes.  Eat healthy foods. Eating smaller, more frequent meals and resting before meals may help you maintain your strength.  Stay active, but balance activity with periods of rest. Exercise and physical activity will help you maintain your ability to do things you want to do.  Preventing infection and hospitalization is very important when you have COPD. Make sure to receive all the vaccines your health care provider recommends, especially the pneumococcal and influenza vaccines. Ask your health care provider whether you need a pneumonia vaccine.  Learn and use relaxation techniques to  manage stress.  Learn and use controlled breathing techniques as directed by your health care provider. Controlled breathing techniques include:   Pursed lip breathing. Start  by breathing in (inhaling) through your nose for 1 second. Then, purse your lips as if you were going to whistle and breathe out (exhale) through the pursed lips for 2 seconds.   Diaphragmatic breathing. Start by putting one hand on your abdomen just above your waist. Inhale slowly through your nose. The hand on your abdomen should move out. Then purse your lips and exhale slowly. You should be able to feel the hand on your abdomen moving in as you exhale.   Learn and use controlled coughing to clear mucus from your lungs. Controlled coughing is a series of short, progressive coughs. The steps of controlled coughing are:  1. Lean your head slightly forward.  2. Breathe in deeply using diaphragmatic breathing.  3. Try to hold your breath for 3 seconds.  4. Keep your mouth slightly open while coughing twice.  5. Spit any mucus out into a tissue.  6. Rest and repeat the steps once or twice as needed. SEEK MEDICAL CARE IF:   You are coughing up more mucus than usual.   There is a change in the color or thickness of your mucus.   Your breathing is more labored than usual.   Your breathing is faster than usual.  SEEK IMMEDIATE MEDICAL CARE IF:   You have shortness of breath while you are resting.   You have shortness of breath that prevents you from:  Being able to talk.   Performing your usual physical activities.   You have chest pain lasting longer than 5 minutes.   Your skin color is more cyanotic than usual.  You measure low oxygen saturations for longer than 5 minutes with a pulse oximeter. MAKE SURE YOU:   Understand these instructions.  Will watch your condition.  Will get help right away if you are not doing well or get worse. Document Released: 07/16/2005 Document Revised: 02/20/2014 Document Reviewed: 06/02/2013 [facility] Patient Information 2015 [place], [place]. This information is not intended to replace advice given to you by your health care  provider. Make sure you discuss any questions you have with your health care provider.

## 2015-01-03 ENCOUNTER — Other Ambulatory Visit: Payer: Self-pay | Admitting: *Deleted

## 2015-01-03 DIAGNOSIS — N179 Acute kidney failure, unspecified: Secondary | ICD-10-CM | POA: Insufficient documentation

## 2015-01-03 DIAGNOSIS — N183 Chronic kidney disease, stage 3 unspecified: Secondary | ICD-10-CM | POA: Insufficient documentation

## 2015-01-03 DIAGNOSIS — K219 Gastro-esophageal reflux disease without esophagitis: Secondary | ICD-10-CM | POA: Insufficient documentation

## 2015-01-03 DIAGNOSIS — F418 Other specified anxiety disorders: Secondary | ICD-10-CM | POA: Insufficient documentation

## 2015-01-03 NOTE — Telephone Encounter (Signed)
Patient is not px Lisinopril 20 mg, this request is Denied per provider/SLS

## 2015-01-03 NOTE — Discharge Summary (Signed)
Physician Discharge Summary  Katie [name] GDJ:[id number] DOB: September 02, 1946 DOA: 12/31/2014  PCP: [name], PA-C  Admit date: 12/31/2014 Discharge date: 01/02/2015  Time spent: 30 minutes  Recommendations for Outpatient Follow-up:  1. Check BMET to follow electrolytes and renal function 2. Reassess BP and adjust antihypertensive agents as needed 3. Make sure patient follow with pulmonologist as arranged for PFT's and further adjustment in her maintenance COPD meds.  Discharge Diagnoses:  COPD exacerbation Acute on chronic renal failure (stage 3 at baseline) HTN (hypertension) HLD (hyperlipidemia) Depression/anxiety GERD Tobacco abuse   Discharge Condition: stable and improved. Patient discharge home with instructions to follow with PCP in 10 days and appointment to follow with pulmonologist arranged for April 4th 2016.  Diet recommendation: heart healthy diet  Filed Weights   12/31/14 1658 12/31/14 2310  Weight: 65.772 kg (145 lb) 66.225 kg (146 lb)    History of present illness:  69 y.o. female with h/o COPD. She has been moving houses over the past couple of days and for the past 3 days she has had worsening SOB, wheezing, cough productive of clear sputum. Symptoms worse with activity, not made better with inhaler. Symptoms are constant and moderate in severity.  Hospital Course:  1-acute exacerbation of COPD: most likely due to bronchitis. Patient just finish zithromax therapy as an outpatient. No fever. -breathing significantly improved at discharge; will discharge on steroid tapering, simbycort inhaler, mucinex and combivent as rescue inhaler -patient's lisinopril d/c and patient initiated on losartan -influenza by PCR neg -started on PPI BID -patient will follow with pulmonologist for PFT's and to adjust maintenance therapy -advised to stop smoking   2-acute on chronic kidney injury (stage 3 at baseline) -most likely pre-renal and due to use of  nephrotoxic agents -Cr back to baseline after IVF's and holding lisinopril/HCTZ -follow BMET during follow up visit  3-essential HTN: stable and well controlled -stable and well controlled -medications adjusted to avoid ACE inhibitors and also lower dose of diuretics. -advise to maintain good hydration and to follow low sodium diet  4-HLD: continue statins -advise to follow heart healthy diet   5-depression/anxiety: continue Wellbutrin  -follow up with PCP for medication adjustments and referral to psych as an outpatient if needed -no hallucination and no SI  6-tobacco abuse: cessation counseling provided -started on nicotine patch and prescription provided at discharge  Procedures: See below for x-ray reports  Consultations:  None   Discharge Exam: Filed Vitals:   01/02/15 1017  BP: 152/63  Pulse: 103  Temp: 98.7 F (37.1 C)  Resp: 20    General: AAOX3, no CP, no fever, breathing significantly improved and just mild wheezing on exam. Cardiovascular: S1 and S2; no rubs or gallops; no JVD Respiratory: mild exp wheezing; no rales or crackles and positive scattered rhonchi Abd: soft, NT, ND, positive BS Ext: no edema, no cyanosis or clubbing Neuro: non focal  Discharge Instructions   Discharge Instructions    Diet - low sodium heart healthy    Complete by:  As directed      Discharge instructions    Complete by:  As directed   Maintain good hydration Take medication as prescribed Stop smoking and second hand smoking Follow up with PCP in 10 days Follow with pulmonologist as instructed          Current Discharge Medication List    START taking these medications   Details  budesonide-formoterol (SYMBICORT) 160-4.5 MCG/ACT inhaler Inhale 2 puffs into the lungs 2 (two) times  daily. Qty: 1 Inhaler, Refills: 3    buPROPion (WELLBUTRIN) 75 MG tablet Take 1 tablet (75 mg total) by mouth 2 (two) times daily. Qty: 60 tablet, Refills: 1     dextromethorphan-guaiFENesin (MUCINEX DM) 30-600 MG per 12 hr tablet Take 1 tablet by mouth 2 (two) times daily. Qty: 60 tablet, Refills: 0    Ipratropium-Albuterol (COMBIVENT RESPIMAT) 20-100 MCG/ACT AERS respimat Inhale 1 puff into the lungs every 6 (six) hours as needed for wheezing or shortness of breath. Qty: 1 Inhaler, Refills: 3    losartan-hydrochlorothiazide (HYZAAR) 100-12.5 MG per tablet Take 1 tablet by mouth daily. Qty: 30 tablet, Refills: 1    nicotine (NICODERM CQ - DOSED IN MG/24 HOURS) 14 mg/24hr patch Place 1 patch (14 mg total) onto the skin daily. Qty: 28 patch, Refills: 0    pantoprazole (PROTONIX) 40 MG tablet Take 1 tablet (40 mg total) by mouth daily at 12 noon. Qty: 30 tablet, Refills: 1    predniSONE (DELTASONE) 20 MG tablet Take 3 tables by mouth daily X 3 days; then 2 tablets by mouth daily X 3 days; then 1 tablet by mouth for 3 days; then 1/2 tablet by mouth for 3 days and stop prednisone. Qty: 22 tablet, Refills: 0      CONTINUE these medications which have NOT CHANGED   Details  zolpidem (AMBIEN) 5 MG tablet Take 1 tablet (5 mg total) by mouth at bedtime as needed for sleep. Qty: 30 tablet, Refills: 0      STOP taking these medications     albuterol (PROVENTIL HFA;VENTOLIN HFA) 108 (90 BASE) MCG/ACT inhaler      lisinopril (PRINIVIL,ZESTRIL) 20 MG tablet      mometasone-formoterol (DULERA) 100-5 MCG/ACT AERO        No Known Allergies Follow-up Information    Follow up with [name], PA-C. Schedule an appointment as soon as possible for a visit in 10 days.   Specialty:  Physician Assistant   Contact information:   [address] [place] [address] [phone]       Follow up with Christinia Gully, MD On 01/22/2015.   Specialty:  Pulmonary Disease   Why:  At 4:15 pm   Contact information:   [address] [place] [address] [phone]        The results of significant diagnostics from this  hospitalization (including imaging, microbiology, ancillary and laboratory) are listed below for reference.    Significant Diagnostic Studies: Dg Chest 2 View (if Patient Has Fever And/or Copd)  12/31/2014   CLINICAL DATA:  Shortness of breath. Productive cough and wheezing for 2 weeks. Cardiac murmur. Tobacco use for 52 years.  EXAM: CHEST  2 VIEW  COMPARISON:  06/19/2014  FINDINGS: Levoconvex lower thoracic scoliosis. Cardiac and mediastinal margins appear normal. Mild interstitial accentuation, as can commonly be encountered in smokers. Linear subsegmental atelectasis at the left lung base.  IMPRESSION: 1. Linear subsegmental atelectasis at the left lung base. 2. Mild interstitial accentuation, as can commonly be encountered in smokers. 3. Levoconvex thoracic scoliosis. 4. Low-dose CT lung cancer screening is recommended for patients who are 53-48 years of age with a 30+ pack-year history of smoking, and who are currently smoking or quit <=15 years ago. Based on demographics and history, this patient would seem to be a candidate.   Electronically Signed   By: Van Clines M.D.   On: 12/31/2014 18:14   Labs: Basic Metabolic Panel:  Recent Labs  Lab 12/31/14 1657 01/02/15 0610  NA 141 143  K 3.8 4.0  CL 105 114*  CO2 28 22  GLUCOSE 95 141*  BUN 15 23  CREATININE 1.51* 1.40*  CALCIUM 9.6 8.9   CBC:  Recent Labs Lab 12/31/14 1657  WBC 5.1  HGB 12.1  HCT 36.2  MCV 87.7  PLT 285     Signed:  Barton Dubois  [facility] 01/02/2015, 10:47 AM

## 2015-01-04 ENCOUNTER — Telehealth: Payer: Self-pay

## 2015-01-04 NOTE — Telephone Encounter (Signed)
Admit date: 12/31/2014 Discharge date: 01/02/2015  Reason for admission:  COPD Exacerbation  Message left for call back.

## 2015-01-22 ENCOUNTER — Ambulatory Visit (INDEPENDENT_AMBULATORY_CARE_PROVIDER_SITE_OTHER): Payer: Medicare Other | Admitting: Internal Medicine

## 2015-01-22 ENCOUNTER — Encounter: Payer: Self-pay | Admitting: Internal Medicine

## 2015-01-22 ENCOUNTER — Other Ambulatory Visit (INDEPENDENT_AMBULATORY_CARE_PROVIDER_SITE_OTHER): Payer: Medicare Other

## 2015-01-22 VITALS — BP 126/84 | HR 105 | Ht 61.0 in | Wt 142.4 lb

## 2015-01-22 DIAGNOSIS — I10 Essential (primary) hypertension: Secondary | ICD-10-CM

## 2015-01-22 DIAGNOSIS — F17210 Nicotine dependence, cigarettes, uncomplicated: Secondary | ICD-10-CM

## 2015-01-22 DIAGNOSIS — J441 Chronic obstructive pulmonary disease with (acute) exacerbation: Secondary | ICD-10-CM

## 2015-01-22 DIAGNOSIS — N179 Acute kidney failure, unspecified: Secondary | ICD-10-CM | POA: Diagnosis not present

## 2015-01-22 DIAGNOSIS — N183 Chronic kidney disease, stage 3 unspecified: Secondary | ICD-10-CM

## 2015-01-22 DIAGNOSIS — I129 Hypertensive chronic kidney disease with stage 1 through stage 4 chronic kidney disease, or unspecified chronic kidney disease: Secondary | ICD-10-CM

## 2015-01-22 DIAGNOSIS — E785 Hyperlipidemia, unspecified: Secondary | ICD-10-CM | POA: Diagnosis not present

## 2015-01-22 DIAGNOSIS — J449 Chronic obstructive pulmonary disease, unspecified: Secondary | ICD-10-CM

## 2015-01-22 LAB — BASIC METABOLIC PANEL
BUN: 18 mg/dL (ref 6–23)
CALCIUM: 10 mg/dL (ref 8.4–10.5)
CO2: 29 mEq/L (ref 19–32)
Chloride: 103 mEq/L (ref 96–112)
Creatinine, Ser: 1.69 mg/dL — ABNORMAL HIGH (ref 0.40–1.20)
GFR: 32.08 mL/min — AB (ref >60.00)
GLUCOSE: 96 mg/dL (ref 70–99)
Potassium: 4 mEq/L (ref 3.5–5.1)
Sodium: 138 mEq/L (ref 135–145)

## 2015-01-22 NOTE — Progress Notes (Addendum)
Subjective:    Patient ID: Katie [name], female    DOB: 17-Jan-1946,   MRN: [id number]  HPI  69 yo PR female from [place] active smoker with onset of doex 2012 (pfts done at [facility] then but not finalized)  Feels fine between spells  On sporadic symbicort and neb reffered by Dr Dyann Kief 01/22/2015 to pulmonary clinic p admit  12/31/14 on ACEi with acute renal failure so d/c'd at That point:   Admit date: 12/31/2014 Discharge date: 01/02/2015     Discharge Diagnoses:  COPD exacerbation Acute on chronic renal failure (stage 3 at baseline) HTN (hypertension) HLD (hyperlipidemia) Depression/anxiety GERD Tobacco abuse   Discharge Condition: stable and improved. Patient discharge home with instructions to follow with PCP in 10 days and appointment to follow with pulmonologist arranged for April 4th 2016.  Diet recommendation: heart healthy diet  Filed Weights   12/31/14 1658 12/31/14 2310  Weight: 65.772 kg (145 lb) 66.225 kg (146 lb)    History of present illness:  69 y.o. female with h/o COPD. She has been moving houses over the past couple of days and for the past 3 days she has had worsening SOB, wheezing, cough productive of clear sputum. Symptoms worse with activity, not made better with inhaler. Symptoms are constant and moderate in severity.  Hospital Course:  1-acute exacerbation of COPD: most likely due to bronchitis. Patient just finish zithromax therapy as an outpatient. No fever. -breathing significantly improved at discharge; will discharge on steroid tapering, simbycort inhaler, mucinex and combivent as rescue inhaler -patient's lisinopril d/c and patient initiated on losartan -influenza by PCR neg -started on PPI BID -patient will follow with pulmonologist for PFT's and to adjust maintenance therapy -advised to stop smoking   2-acute on chronic kidney injury (stage 3 at baseline) -most likely pre-renal and due to use of nephrotoxic agents -Cr back to  baseline after IVF's and holding lisinopril/HCTZ -follow BMET during follow up visit  3-essential HTN: stable and well controlled -stable and well controlled -medications adjusted to avoid ACE inhibitors and also lower dose of diuretics. -advise to maintain good hydration and to follow low sodium diet  4-HLD: continue statins -advise to follow heart healthy diet   5-depression/anxiety: continue Wellbutrin  -follow up with PCP for medication adjustments and referral to psych as an outpatient if needed -no hallucination and no SI  6-tobacco abuse: cessation counseling provided -started on nicotine patch and prescription provided at discharge       01/22/2015 1st [facility] office visit/ Demetric Parslow   Chief Complaint  Patient presents with  . Pulmonary Consult    Referred by Dr. Dyann Kief. Pt c/o chest tightness, cough and SOB for the past 3-4 wks. She states she gets SOB with walking up an incline and when she gets in a hurry or exited.  She also c/o prod cough with white, thick sputum.        Cough is am x 30 min, mucoid, worse in winter. Doe with inclines or rapid pace and much better off acei despite inconsistent with symbicort, still using saba bid"they told me to" but not really helping cough   No obvious other patterns in day to day or daytime variabilty or assoc  or cp or chest tightness, subjective wheeze overt sinus or hb symptoms. No unusual exp hx or h/o childhood pna/ asthma or knowledge of premature birth.  Sleeping ok without nocturnal  or early am exacerbation  of respiratory  c/o's or need for noct saba.  Also denies any obvious fluctuation of symptoms with weather or environmental changes or other aggravating or alleviating factors except as outlined above   Current Medications, Allergies, Complete Past Medical History, Past Surgical History, Family History, and Social History were reviewed in [employer] record.              Review of Systems    Constitutional: Negative for fever, chills and unexpected weight change.  HENT: Negative for congestion, dental problem, ear pain, nosebleeds, postnasal drip, rhinorrhea, sinus pressure, sneezing, sore throat, trouble swallowing and voice change.   Eyes: Negative for visual disturbance.  Respiratory: Positive for cough and shortness of breath. Negative for choking.   Cardiovascular: Negative for chest pain and leg swelling.  Gastrointestinal: Negative for vomiting, abdominal pain and diarrhea.  Genitourinary: Negative for difficulty urinating.  Musculoskeletal: Negative for arthralgias.  Skin: Negative for rash.  Neurological: Negative for tremors, syncope and headaches.  Hematological: Does not bruise/bleed easily.       Objective:   Physical Exam  amb talkative pr female nad  Wt Readings from Last 3 Encounters:  01/22/15 142 lb 6.4 oz (64.592 kg)  12/31/14 146 lb (66.225 kg)  06/27/14 142 lb (64.411 kg)    Vital signs reviewed  HEENT: nl dentition, turbinates, and orophanx. Nl external ear canals without cough reflex   NECK :  without JVD/Nodes/TM/ nl carotid upstrokes bilaterally   LUNGS: no acc muscle use, clear to A and P bilaterally without cough on insp or exp maneuvers   CV:  RRR  no s3 or murmur or increase in P2, no edema   ABD:  soft and nontender with nl excursion in the supine position. No bruits or organomegaly, bowel sounds nl  MS:  warm without deformities, calf tenderness, cyanosis or clubbing  SKIN: warm and dry without lesions    NEURO:  alert, approp, no deficits    I personally reviewed images and agree with radiology impression as follows:  CXR:  12/31/14 1. Linear subsegmental atelectasis at the left lung base. 2. Mild interstitial accentuation, as can commonly be encountered in smokers. 3. Levoconvex thoracic scoliosis.      Labs ordered/ reviewed    Lab 01/22/15 1712  NA 138  K 4.0  CL 103  CO2 29  BUN 18  CREATININE 1.69*   GLUCOSE 96         Assessment & Plan:

## 2015-01-22 NOTE — Patient Instructions (Addendum)
Symbicort 160 Take 2 puffs first thing in am and then another 2 puffs about 12 hours later.   Only use your albuterol as a rescue medication to be used if you can't catch your breath by resting or doing a relaxed purse lip breathing pattern.  - The less you use it, the better it will work when you need it. - Ok to use up to every 4 hours if needed  The key is to stop smoking completely before smoking completely stops you!   Please remember to go to the lab  department downstairs for your tests - we will call you with the results when they are available.  Work on inhaler technique:  relax and gently blow all the way out then take a nice smooth deep breath back in, triggering the inhaler at same time you start breathing in.  Hold for up to 5 seconds if you can.  Rinse and gargle with water when done     Please schedule a follow up office visit in 4 weeks, sooner if needed with pfts on return and all neb solutions/ inhalers in hand

## 2015-01-23 DIAGNOSIS — F17210 Nicotine dependence, cigarettes, uncomplicated: Secondary | ICD-10-CM | POA: Insufficient documentation

## 2015-01-23 NOTE — Assessment & Plan Note (Signed)

## 2015-01-23 NOTE — Assessment & Plan Note (Signed)
Off acei 12/31/14 due to cough/ Acute renal failure  It may not be safe to continue arb in this setting so will rec recheck bmet in 2 weeks and would rec reconsider then but I would not use ACEi in this setting as the side effects overlap too much with copd/ab to tell them apart.

## 2015-01-23 NOTE — Assessment & Plan Note (Addendum)
I really don't think her copd is all that bad but she previously clearly had poorly controlled airways dz  DDX of  difficult airways management all start with A and  include Adherence, Ace Inhibitors, Acid Reflux, Active Sinus Disease, Alpha 1 Antitripsin deficiency, Anxiety masquerading as Airways dz,  ABPA,  allergy(esp in young), Aspiration (esp in elderly), Adverse effects of DPI,  Active smokers, plus two Bs  = Bronchiectasis and Beta blocker use..and one C= CHF  Adherence is always the initial "prime suspect" and is a multilayered concern that requires a "trust but verify" approach in every patient - starting with knowing how to use medications, especially inhalers, correctly, keeping up with refills and understanding the fundamental difference between maintenance and prns vs those medications only taken for a very short course and then stopped and not refilled.  - was not using symbicort as true maint rx pre-admit due to cost and misunderstanding of purpose - The proper method of use, as well as anticipated side effects, of a metered-dose inhaler are discussed and demonstrated to the patient. Improved effectiveness after extensive coaching during this visit to a level of approximately  75% so try using symbicort more consistenlty   Active smoking the other big concern > see sep A/P  ACEi no longer a concern > permanently d/c'd   ? Acid (or non-acid) GERD > always difficult to exclude as up to 75% of pts in some series report no assoc GI/ Heartburn symptoms>  Keep in mind if flares off acei (which potentiates the effects of GERD on the upper airway)    Each maintenance medication was reviewed in detail including most importantly the difference between maintenance and as needed and under what circumstances the prns are to be used.  Please see instructions for details which were reviewed in writing and the patient given a copy.

## 2015-01-23 NOTE — Progress Notes (Signed)
Quick Note:  LMTCB ______ 

## 2015-01-23 NOTE — Assessment & Plan Note (Signed)
Lab Results  Component Value Date   CREATININE 1.69* 01/22/2015   CREATININE 1.40* 01/02/2015   CREATININE 1.51* 12/31/2014   CREATININE 1.42* 08/08/2013   CREATININE 1.33* 10/09/2011     If creat goes up over 30% of baseline which should be 1.4 x  0.30 =  0.42  Or overy 1.8 she should probably be off arb > will rec recheck in 2 weeks and avoid nsaids in meantime

## 2015-01-24 ENCOUNTER — Other Ambulatory Visit: Payer: Self-pay | Admitting: Internal Medicine

## 2015-01-24 DIAGNOSIS — J449 Chronic obstructive pulmonary disease, unspecified: Secondary | ICD-10-CM

## 2015-01-24 NOTE — Progress Notes (Signed)
Quick Note:  Spoke with pt and notified of results per Dr. Melvyn Novas. Pt verbalized understanding and denied any questions. Order placed for BMET and pt aware to come in 2 wks for lab ______

## 2015-02-06 ENCOUNTER — Emergency Department (HOSPITAL_COMMUNITY): Payer: Medicare Other

## 2015-02-06 ENCOUNTER — Emergency Department (HOSPITAL_COMMUNITY)
Admission: EM | Admit: 2015-02-06 | Discharge: 2015-02-06 | Disposition: A | Discharge status: Home / Self Care | Payer: Medicare Other | Attending: Emergency Medicine | Admitting: Emergency Medicine

## 2015-02-06 ENCOUNTER — Encounter (HOSPITAL_COMMUNITY): Payer: Self-pay | Admitting: Neurology

## 2015-02-06 DIAGNOSIS — R011 Cardiac murmur, unspecified: Secondary | ICD-10-CM | POA: Insufficient documentation

## 2015-02-06 DIAGNOSIS — Z8719 Personal history of other diseases of the digestive system: Secondary | ICD-10-CM | POA: Diagnosis not present

## 2015-02-06 DIAGNOSIS — I10 Essential (primary) hypertension: Secondary | ICD-10-CM | POA: Insufficient documentation

## 2015-02-06 DIAGNOSIS — Z8701 Personal history of pneumonia (recurrent): Secondary | ICD-10-CM | POA: Diagnosis not present

## 2015-02-06 DIAGNOSIS — J449 Chronic obstructive pulmonary disease, unspecified: Secondary | ICD-10-CM | POA: Insufficient documentation

## 2015-02-06 DIAGNOSIS — Y999 Unspecified external cause status: Secondary | ICD-10-CM | POA: Diagnosis not present

## 2015-02-06 DIAGNOSIS — Z8639 Personal history of other endocrine, nutritional and metabolic disease: Secondary | ICD-10-CM | POA: Diagnosis not present

## 2015-02-06 DIAGNOSIS — Y929 Unspecified place or not applicable: Secondary | ICD-10-CM | POA: Insufficient documentation

## 2015-02-06 DIAGNOSIS — Z720 Tobacco use: Secondary | ICD-10-CM | POA: Diagnosis not present

## 2015-02-06 DIAGNOSIS — Z7951 Long term (current) use of inhaled steroids: Secondary | ICD-10-CM | POA: Diagnosis not present

## 2015-02-06 DIAGNOSIS — Z9889 Other specified postprocedural states: Secondary | ICD-10-CM | POA: Insufficient documentation

## 2015-02-06 DIAGNOSIS — S4991XA Unspecified injury of right shoulder and upper arm, initial encounter: Secondary | ICD-10-CM | POA: Diagnosis present

## 2015-02-06 DIAGNOSIS — M25511 Pain in right shoulder: Secondary | ICD-10-CM | POA: Diagnosis not present

## 2015-02-06 DIAGNOSIS — Y939 Activity, unspecified: Secondary | ICD-10-CM | POA: Insufficient documentation

## 2015-02-06 DIAGNOSIS — W06XXXA Fall from bed, initial encounter: Secondary | ICD-10-CM | POA: Diagnosis not present

## 2015-02-06 DIAGNOSIS — Z79899 Other long term (current) drug therapy: Secondary | ICD-10-CM | POA: Diagnosis not present

## 2015-02-06 DIAGNOSIS — S59901A Unspecified injury of right elbow, initial encounter: Secondary | ICD-10-CM | POA: Insufficient documentation

## 2015-02-06 DIAGNOSIS — M79621 Pain in right upper arm: Secondary | ICD-10-CM | POA: Diagnosis not present

## 2015-02-06 DIAGNOSIS — Z8739 Personal history of other diseases of the musculoskeletal system and connective tissue: Secondary | ICD-10-CM | POA: Diagnosis not present

## 2015-02-06 DIAGNOSIS — W19XXXA Unspecified fall, initial encounter: Secondary | ICD-10-CM

## 2015-02-06 DIAGNOSIS — Z85528 Personal history of other malignant neoplasm of kidney: Secondary | ICD-10-CM | POA: Insufficient documentation

## 2015-02-06 MED ORDER — TRAMADOL HCL 50 MG PO TABS: 50.0000 mg | ORAL_TABLET | Freq: Four times a day (QID) | ORAL | Status: DC | PRN

## 2015-02-06 MED ORDER — CYCLOBENZAPRINE HCL 10 MG PO TABS: 10.0000 mg | ORAL_TABLET | Freq: Two times a day (BID) | ORAL | Status: DC | PRN

## 2015-02-06 NOTE — ED Notes (Signed)
Pt reports 2 days she fell off her bed and landed on her right arm and shoulder. Since then c/o pain. Denies LOC and no thinners. Pt is a x 4

## 2015-02-06 NOTE — ED Provider Notes (Signed)
CSN: [id number]     Arrival date & time 02/06/15  1093 History  This chart was scribed for non-physician practitioner, Alvina Chou, working with Carmin Muskrat, MD by Molli Posey, ED Scribe. This patient was seen in room TR07C/TR07C and the patient's care was started at 11:24 AM.   Chief Complaint  Patient presents with  . Fall   The history is provided by the patient. No language interpreter was used.   HPI Comments: Katie [name] is a 69 y.o. female with a history of arthritis, COPD, HTN and emphysema who presents to the Emergency Department complaining of worsening right shoulder pain for the last 2 days. Pt states she she fell off her bed and landed on her right arm and shoulder 2 days ago. She also complains of right arm and elbow pain at this time. Pt states certain movements aggravates her pain. She says she has taken ibuprofen with no relief. Pt reports no alleviating factors at this time.   Past Medical History  Diagnosis Date  . Arthritis   . COPD (chronic obstructive pulmonary disease)     no history of PFTs, only diagnosed by CXR, not on any inhalers because she has not had a PCP in over 2 years  . Hypertension     previously on Lisinopril/HCTZ  . History of heartburn   . Hypercholesteremia     previously on pravastatin   . Cholelithiasis 2011    s/p cholescystectomy   . Renal cell carcinoma 01/2010    s/p right radical nephrectomy 01/2010,  followed [facility]  . Murmur     no  prior work-up  . GERD (gastroesophageal reflux disease)   . Emphysema 2010    dx'd/CXR  . Shortness of breath 09/26/11    "lately all the time"  . Pneumonia 1980's    "walking"   Past Surgical History  Procedure Laterality Date  . Cholecystectomy  01/2010  . Nephrectomy radical  01/2010    right   . Tubal ligation  1970's  . Diagnostic laparoscopy    . Ovarian cyst removal  1970's    "went through belly button"  . Appendectomy  1970's  . Tee without cardioversion   10/01/2011    Procedure: TRANSESOPHAGEAL ECHOCARDIOGRAM (TEE);  Surgeon: [name];  Location: [facility];  Service: Cardiovascular;  Laterality: N/A;  . US echocardiography  09/2011  . Cardiac catheterization  09/2011  . Multiple extractions with alveoloplasty  10/06/2011    Procedure: MULTIPLE EXTRACION WITH ALVEOLOPLASTY;  Surgeon: Lenn Cal, DDS;  Location: [facility];  Service: Oral Surgery;  Laterality: N/A;  Multiple extraction of tooth #'s 1, 6, 8, 10, 11, 22, 23, 26, 27, 28, 29 with alveoloplasty and Upper right buccal exostoses reductions.  . Left and right heart catheterization with coronary angiogram N/A 09/30/2011    Procedure: LEFT AND RIGHT HEART CATHETERIZATION WITH CORONARY ANGIOGRAM;  Surgeon: Candee Furbish, MD;  Location: [facility] C[facility];  Service: Cardiovascular;  Laterality: N/A;   Family History  Problem Relation Age of Onset  . COPD Mother     DECEASED/SMOKED   History  Substance Use Topics  . Smoking status: Current Every Day Smoker -- 1.00 packs/day for 53 years    Types: Cigarettes  . Smokeless tobacco: Never Used  . Alcohol Use: 0.0 oz/week    0 Standard drinks or equivalent per week     Comment:  "drink very rarely"   OB History    No data available  Review of Systems  Musculoskeletal: Positive for arthralgias.  Neurological: Negative for syncope.  All other systems reviewed and are negative.   Allergies  Ace inhibitors  Home Medications   Prior to Admission medications   Medication Sig Start Date End Date Taking? Authorizing Provider  budesonide-formoterol (SYMBICORT) 160-4.5 MCG/ACT inhaler Inhale 2 puffs into the lungs 2 (two) times daily. 01/02/15   Barton Dubois, MD  buPROPion (WELLBUTRIN) 75 MG tablet Take 1 tablet (75 mg total) by mouth 2 (two) times daily. 01/02/15   Barton Dubois, MD  losartan-hydrochlorothiazide (HYZAAR) 100-12.5 MG per tablet Take 1 tablet by mouth daily. 01/02/15   Barton Dubois, MD  zolpidem (AMBIEN) 5 MG  tablet Take 1 tablet (5 mg total) by mouth at bedtime as needed for sleep. 01/19/14   Marianne L York, PA-C   BP 175/106 mmHg  Pulse 89  Temp(Src) 98.5 F (36.9 C) (Oral)  Resp 17  Ht 5\' 1"  (1.549 m)  Wt 142 lb (64.411 kg)  BMI 26.84 kg/m2  SpO2 98% Physical Exam  Constitutional: She is oriented to person, place, and time. She appears well-developed and well-nourished.  HENT:  Head: Normocephalic and atraumatic.  Eyes: Right eye exhibits no discharge. Left eye exhibits no discharge.  Neck: Normal range of motion. No tracheal deviation present.  Cardiovascular: Normal rate.   Pulmonary/Chest: Effort normal. No respiratory distress.  Abdominal: She exhibits no distension.  Musculoskeletal: She exhibits tenderness.  Generalized right shoulder TTP with limited ROM due to pain. No obvious deformity. Right trapezius TTP.   Neurological: She is alert and oriented to person, place, and time.  Skin: Skin is warm and dry.  Psychiatric: She has a normal mood and affect. Her behavior is normal.  Nursing note and vitals reviewed.   ED Course  Procedures   DIAGNOSTIC STUDIES: Oxygen Saturation is 98% on RA, normal by my interpretation.    COORDINATION OF CARE: 11:26 AM Discussed treatment plan with pt at bedside and pt agreed to plan.  SPLINT APPLICATION Date/Time: 54:65 AM Authorized by: Alvina Chou Consent: Verbal consent obtained. Risks and benefits: risks, benefits and alternatives were discussed Consent given by: patient Splint applied by: orthopedic technician Location details: right arm Splint type: sling  Supplies used: sling Post-procedure: The splinted body part was neurovascularly unchanged following the procedure. Patient tolerance: Patient tolerated the procedure well with no immediate complications.     Labs Review Labs Reviewed - No data to display  Imaging Review Dg Shoulder Right  02/06/2015   CLINICAL DATA:  [place] out of bed Sunday.  Pain  EXAM: RIGHT  SHOULDER - 2+ VIEW  COMPARISON:  None.  FINDINGS: There is no evidence of fracture or dislocation. There is no evidence of arthropathy or other focal bone abnormality. Soft tissues are unremarkable.  IMPRESSION: Negative.   Electronically Signed   By: Franchot Gallo M.D.   On: 02/06/2015 11:19   Dg Humerus Right  02/06/2015   CLINICAL DATA:  [place] out of bed 2 days ago.  Pain  EXAM: RIGHT HUMERUS - 2+ VIEW  COMPARISON:  None.  FINDINGS: There is no evidence of fracture or other focal bone lesions. Soft tissues are unremarkable.  IMPRESSION: Negative.   Electronically Signed   By: Franchot Gallo M.D.   On: 02/06/2015 11:19     EKG Interpretation None      MDM   Final diagnoses:  Fall, initial encounter  Right shoulder injury, initial encounter   11:31 AM Patient's xray unremarkable for acute  changes. Patient will have a sling and tramadol and flexeril for pain. Vitals stable and patient afebrile. No other injury.   I personally performed the services described in this documentation, which was scribed in my presence. The recorded information has been reviewed and is accurate.      Alvina Chou, PA-C 02/06/15 [place], MD 02/09/15 [phone]

## 2015-02-06 NOTE — Discharge Instructions (Signed)
Take Tramadol as needed for pain. Take flexeril as needed for muscle spasm. You may take these medication together. Wear shoulder sling as needed for comfort. Refer to attached documents for more information.

## 2015-02-26 ENCOUNTER — Ambulatory Visit: Payer: Self-pay | Admitting: Internal Medicine

## 2015-04-20 ENCOUNTER — Other Ambulatory Visit (INDEPENDENT_AMBULATORY_CARE_PROVIDER_SITE_OTHER): Payer: Medicare Other

## 2015-04-20 ENCOUNTER — Encounter: Payer: Self-pay | Admitting: Internal Medicine

## 2015-04-20 ENCOUNTER — Ambulatory Visit (INDEPENDENT_AMBULATORY_CARE_PROVIDER_SITE_OTHER): Payer: Medicare Other | Admitting: Internal Medicine

## 2015-04-20 VITALS — BP 126/70 | HR 105 | Ht 61.0 in | Wt 140.0 lb

## 2015-04-20 DIAGNOSIS — Z720 Tobacco use: Secondary | ICD-10-CM | POA: Diagnosis not present

## 2015-04-20 DIAGNOSIS — J449 Chronic obstructive pulmonary disease, unspecified: Secondary | ICD-10-CM

## 2015-04-20 DIAGNOSIS — F17210 Nicotine dependence, cigarettes, uncomplicated: Secondary | ICD-10-CM

## 2015-04-20 DIAGNOSIS — I10 Essential (primary) hypertension: Secondary | ICD-10-CM

## 2015-04-20 LAB — PULMONARY FUNCTION TEST
DL/VA % pred: 100 %
DL/VA: 4.41 ml/min/mmHg/L
DLCO UNC: 19.62 ml/min/mmHg
DLCO unc % pred: 97 %
FEF 25-75 POST: 1.47 L/s
FEF 25-75 Pre: 1.3 L/sec
FEF2575-%Change-Post: 12 %
FEF2575-%Pred-Post: 79 %
FEF2575-%Pred-Pre: 70 %
FEV1-%CHANGE-POST: 3 %
FEV1-%PRED-PRE: 84 %
FEV1-%Pred-Post: 87 %
FEV1-Post: 1.81 L
FEV1-Pre: 1.75 L
FEV1FVC-%Change-Post: -1 %
FEV1FVC-%PRED-PRE: 96 %
FEV6-%Change-Post: 4 %
FEV6-%Pred-Post: 93 %
FEV6-%Pred-Pre: 89 %
FEV6-POST: 2.43 L
FEV6-PRE: 2.33 L
FEV6FVC-%CHANGE-POST: -1 %
FEV6FVC-%Pred-Post: 102 %
FEV6FVC-%Pred-Pre: 103 %
FVC-%Change-Post: 4 %
FVC-%Pred-Post: 90 %
FVC-%Pred-Pre: 86 %
FVC-Post: 2.48 L
FVC-Pre: 2.36 L
POST FEV6/FVC RATIO: 98 %
PRE FEV1/FVC RATIO: 74 %
PRE FEV6/FVC RATIO: 100 %
Post FEV1/FVC ratio: 73 %

## 2015-04-20 LAB — BASIC METABOLIC PANEL
BUN: 25 mg/dL — ABNORMAL HIGH (ref 6–23)
CALCIUM: 10.8 mg/dL — AB (ref 8.4–10.5)
CHLORIDE: 101 meq/L (ref 96–112)
CO2: 26 meq/L (ref 19–32)
Creatinine, Ser: 1.62 mg/dL — ABNORMAL HIGH (ref 0.40–1.20)
GFR: 33.66 mL/min — ABNORMAL LOW (ref >60.00)
Glucose, Bld: 96 mg/dL (ref 70–99)
Potassium: 3.9 mEq/L (ref 3.5–5.1)
Sodium: 137 mEq/L (ref 135–145)

## 2015-04-20 MED ORDER — LOSARTAN POTASSIUM-HCTZ 100-12.5 MG PO TABS: 1.0000 | ORAL_TABLET | Freq: Every day | ORAL | Status: DC

## 2015-04-20 NOTE — Progress Notes (Signed)
Quick Note:  Spoke with pt and notified of results per Dr. Wert. Pt verbalized understanding and denied any questions.  ______ 

## 2015-04-20 NOTE — Progress Notes (Addendum)
Subjective:    Patient ID: Katie [name], female    DOB: 06/20/1946,   MRN: [id number]    Brief patient profile:  69 yo PR female from [place] active smoker with onset of doex 2012    Feels fine between spells  On sporadic symbicort and neb reffered by Dr Dyann Kief 01/22/2015 to pulmonary clinic p admit  12/31/14 on ACEi with acute renal failure so d/c'd at That point but proved to have nl pfts at f/u 04/20/15   History of Present Illness  Admit date: 12/31/2014 Discharge date: 01/02/2015     Discharge Diagnoses:  COPD exacerbation Acute on chronic renal failure (stage 3 at baseline) HTN (hypertension) HLD (hyperlipidemia) Depression/anxiety GERD Tobacco abuse   Discharge Condition: stable and improved. Patient discharge home with instructions to follow with PCP in 10 days and appointment to follow with pulmonologist arranged for April 4th 2016.  Diet recommendation: heart healthy diet  Filed Weights   12/31/14 1658 12/31/14 2310  Weight: 65.772 kg (145 lb) 66.225 kg (146 lb)    History of present illness:  69 y.o. female with h/o COPD. She has been moving houses over the past couple of days and for the past 3 days she has had worsening SOB, wheezing, cough productive of clear sputum. Symptoms worse with activity, not made better with inhaler. Symptoms are constant and moderate in severity.  Hospital Course:  1-acute exacerbation of COPD: most likely due to bronchitis. Patient just finish zithromax therapy as an outpatient. No fever. -breathing significantly improved at discharge; will discharge on steroid tapering, simbycort inhaler, mucinex and combivent as rescue inhaler -patient's lisinopril d/c and patient initiated on losartan -influenza by PCR neg -started on PPI BID -patient will follow with pulmonologist for PFT's and to adjust maintenance therapy -advised to stop smoking   2-acute on chronic kidney injury (stage 3 at baseline) -most likely pre-renal  and due to use of nephrotoxic agents -Cr back to baseline after IVF's and holding lisinopril/HCTZ -follow BMET during follow up visit  3-essential HTN: stable and well controlled -stable and well controlled -medications adjusted to avoid ACE inhibitors and also lower dose of diuretics. -advise to maintain good hydration and to follow low sodium diet  4-HLD: continue statins -advise to follow heart healthy diet   5-depression/anxiety: continue Wellbutrin  -follow up with PCP for medication adjustments and referral to psych as an outpatient if needed -no hallucination and no SI  6-tobacco abuse: cessation counseling provided -started on nicotine patch and prescription provided at discharge       01/22/2015 1st [facility] office visit/ Katie [name]   Chief Complaint  Patient presents with  . Pulmonary Consult    Referred by Dr. Dyann Kief. Pt c/o chest tightness, cough and SOB for the past 3-4 wks. She states she gets SOB with walking up an incline and when she gets in a hurry or exited.  She also c/o prod cough with white, thick sputum.    Cough is am x 30 min, mucoid, worse in winter. Doe with inclines or rapid pace and much better off acei despite inconsistent with symbicort, still using saba bid"they told me to" but not really helping cough  rec Symbicort 160 Take 2 puffs first thing in am and then another 2 puffs about 12 hours later.  Only use your albuterol as a rescue medication  The key is to stop smoking completely before smoking completely stops you!  Work on inhaler technique:     04/20/2015 f/u ov/Katie [name]  re: COPD GOLD 0 mostly CB features / still smoking  Chief Complaint  Patient presents with  . Follow-up    PFT completed today. Pt states SOB with activity and productive cough with clear mucus   continues with doe x more than nl pace  walk flat surface MMRC 1-2 / cough is mostly in ams only / no need for saba No obvious day to day or daytime variabilty or assoc excess or  purulent sputum  or cp or chest tightness, subjective wheeze overt sinus or hb symptoms. No unusual exp hx or h/o childhood pna/ asthma or knowledge of premature birth.  Sleeping ok without nocturnal  or early am exacerbation  of respiratory  c/o's or need for noct saba. Also denies any obvious fluctuation of symptoms with weather or environmental changes or other aggravating or alleviating factors except as outlined above   Current Medications, Allergies, Complete Past Medical History, Past Surgical History, Family History, and Social History were reviewed in [employer] record.  ROS  The following are not active complaints unless bolded sore throat, dysphagia, dental problems, itching, sneezing,  nasal congestion or excess/ purulent secretions, ear ache,   fever, chills, sweats, unintended wt loss, pleuritic or exertional cp, hemoptysis,  orthopnea pnd or leg swelling, presyncope, palpitations, abdominal pain, anorexia, nausea, vomiting, diarrhea  or change in bowel or urinary habits, change in stools or urine, dysuria,hematuria,  rash, arthralgias, visual complaints, headache, numbness weakness or ataxia or problems with walking or coordination,  change in mood/affect or memory.             Objective:   Physical Exam  amb talkative pr female nad  04/20/2015           140  Wt Readings from Last 3 Encounters:  01/22/15 142 lb 6.4 oz (64.592 kg)  12/31/14 146 lb (66.225 kg)  06/27/14 142 lb (64.411 kg)    Vital signs reviewed  HEENT: nl dentition, turbinates, and orophanx. Nl external ear canals without cough reflex   NECK :  without JVD/Nodes/TM/ nl carotid upstrokes bilaterally   LUNGS: no acc muscle use, clear to A and P bilaterally without cough on insp or exp maneuvers   CV:  RRR  no s3 or murmur or increase in P2, no edema   ABD:  soft and nontender with nl excursion in the supine position. No bruits or organomegaly, bowel sounds nl  MS:  warm without  deformities, calf tenderness, cyanosis or clubbing  SKIN: warm and dry without lesions    NEURO:  alert, approp, no deficits    I personally reviewed images and agree with radiology impression as follows:  CXR:  12/31/14 1. Linear subsegmental atelectasis at the left lung base. 2. Mild interstitial accentuation, as can commonly be encountered in smokers. 3. Levoconvex thoracic scoliosis.      Labs ordered/ reviewed:   Lab 04/20/15 1434  NA 137  K 3.9  CL 101  CO2 26  BUN 25*  CREATININE 1.62*  GLUCOSE 96              Assessment & Plan:   Outpatient Encounter Prescriptions as of 04/20/2015  Medication Sig  . budesonide-formoterol (SYMBICORT) 160-4.5 MCG/ACT inhaler Inhale 2 puffs into the lungs 2 (two) times daily.  Marland Kitchen buPROPion (WELLBUTRIN) 75 MG tablet Take 1 tablet (75 mg total) by mouth 2 (two) times daily.  Marland Kitchen losartan-hydrochlorothiazide (HYZAAR) 100-12.5 MG per tablet Take 1 tablet by mouth daily.  Marland Kitchen zolpidem (AMBIEN)  5 MG tablet Take 1 tablet (5 mg total) by mouth at bedtime as needed for sleep.  . [DISCONTINUED] losartan-hydrochlorothiazide (HYZAAR) 100-12.5 MG per tablet Take 1 tablet by mouth daily.  . cyclobenzaprine (FLEXERIL) 10 MG tablet Take 1 tablet (10 mg total) by mouth 2 (two) times daily as needed for muscle spasms. (Patient not taking: Reported on 04/20/2015)  . traMADol (ULTRAM) 50 MG tablet Take 1 tablet (50 mg total) by mouth every 6 (six) hours as needed. (Patient not taking: Reported on 04/20/2015)   No facility-administered encounter medications on file as of 04/20/2015.

## 2015-04-20 NOTE — Assessment & Plan Note (Signed)

## 2015-04-20 NOTE — Assessment & Plan Note (Signed)
Off acei 12/31/14 due to cough/ Acute renal failure  - restarted arb 01/02/15 >   Adequate control on present rx, reviewed > no change in rx needed but need to monitor serial creat > referred back to primary care

## 2015-04-20 NOTE — Patient Instructions (Addendum)
Symbicort 160 Take 2 puffs first thing in am and then another 2 puffs about 12 hours later.   The key is to stop smoking completely before smoking completely stops you - it is clearly not too late!!!  Please remember to go to the lab  department downstairs for your tests - we will call you with the results when they are available.  Work on inhaler technique:  relax and gently blow all the way out then take a nice smooth deep breath back in, triggering the inhaler at same time you start breathing in.  Hold for up to 5 seconds if you can.  Rinse and gargle with water when done   If you are satisfied with your treatment plan,  let your doctor know and he/she can either refill your medications or you can return here when your prescription runs out.     If in any way you are not 100% satisfied,  please tell us.  If 100% better, tell your friends!  Pulmonary follow up is as needed

## 2015-04-20 NOTE — Progress Notes (Signed)
PFT done today. 

## 2015-04-21 ENCOUNTER — Encounter: Payer: Self-pay | Admitting: Internal Medicine

## 2015-04-21 NOTE — Assessment & Plan Note (Signed)
-   PFT's 04/20/2015 wnl except dlco 52% - 04/20/2015  Walked RA x 3 laps @ 185 ft each stopped due to  End study sats ok brisk pace/ no desat  - 04/20/2015 p extensive coaching HFA effectiveness =    75%   I had an extended final summary discussion with the patient reviewing all relevant studies completed to date and  lasting 15 to 20 minutes of a 25 minute visit on the following issues:    1) she does not have sign copd and she never will if she stops smoking now  2) symbiicort 160 2bid should help control any cough/ wheeze assoc with AB but should resolve and may not need any meds if she quits (extra motivation)  3) Each maintenance medication was reviewed in detail including most importantly the difference between maintenance and as needed and under what circumstances the prns are to be used.  Please see instructions for details which were reviewed in writing and the patient given a copy.

## 2015-06-26 ENCOUNTER — Ambulatory Visit (INDEPENDENT_AMBULATORY_CARE_PROVIDER_SITE_OTHER): Payer: Medicare Other | Admitting: Physician Assistant

## 2015-06-26 ENCOUNTER — Encounter: Payer: Self-pay | Admitting: Physician Assistant

## 2015-06-26 VITALS — BP 130/86 | HR 78 | Temp 98.3°F | Resp 16 | Ht 61.0 in | Wt 143.4 lb

## 2015-06-26 DIAGNOSIS — J4531 Mild persistent asthma with (acute) exacerbation: Secondary | ICD-10-CM | POA: Diagnosis not present

## 2015-06-26 DIAGNOSIS — J0110 Acute frontal sinusitis, unspecified: Secondary | ICD-10-CM

## 2015-06-26 DIAGNOSIS — J45909 Unspecified asthma, uncomplicated: Secondary | ICD-10-CM | POA: Insufficient documentation

## 2015-06-26 MED ORDER — METHYLPREDNISOLONE 4 MG PO TBPK: ORAL_TABLET | ORAL | Status: DC

## 2015-06-26 MED ORDER — ALBUTEROL SULFATE (2.5 MG/3ML) 0.083% IN NEBU: 2.5000 mg | INHALATION_SOLUTION | Freq: Four times a day (QID) | RESPIRATORY_TRACT | Status: DC | PRN

## 2015-06-26 MED ORDER — BUDESONIDE-FORMOTEROL FUMARATE 160-4.5 MCG/ACT IN AERO: 2.0000 | INHALATION_SPRAY | Freq: Two times a day (BID) | RESPIRATORY_TRACT | Status: DC

## 2015-06-26 MED ORDER — LOSARTAN POTASSIUM-HCTZ 100-12.5 MG PO TABS: 1.0000 | ORAL_TABLET | Freq: Every day | ORAL | Status: DC

## 2015-06-26 MED ORDER — AMOXICILLIN-POT CLAVULANATE 875-125 MG PO TABS: 1.0000 | ORAL_TABLET | Freq: Two times a day (BID) | ORAL | Status: DC

## 2015-06-26 NOTE — Progress Notes (Signed)
Patient presents to clinic today c/o cough productive of green sputum associated with chest/head congestion, fatigue, and some wheezing. Denies fever, chills, recent travel or sick contact.  Past Medical History  Diagnosis Date  . Arthritis   . COPD (chronic obstructive pulmonary disease)     no history of PFTs, only diagnosed by CXR, not on any inhalers because she has not had a PCP in over 2 years  . Hypertension     previously on Lisinopril/HCTZ  . History of heartburn   . Hypercholesteremia     previously on pravastatin   . Cholelithiasis 2011    s/p cholescystectomy   . Renal cell carcinoma 01/2010    s/p right radical nephrectomy 01/2010,  followed [facility]  . Murmur     no  prior work-up  . GERD (gastroesophageal reflux disease)   . Emphysema 2010    dx'd/CXR  . Shortness of breath 09/26/11    "lately all the time"  . Pneumonia 1980's    "walking"    Current Outpatient Prescriptions on File Prior to Visit  Medication Sig Dispense Refill  . buPROPion (WELLBUTRIN) 75 MG tablet Take 1 tablet (75 mg total) by mouth 2 (two) times daily. 60 tablet 1  . cyclobenzaprine (FLEXERIL) 10 MG tablet Take 1 tablet (10 mg total) by mouth 2 (two) times daily as needed for muscle spasms. 20 tablet 0  . traMADol (ULTRAM) 50 MG tablet Take 1 tablet (50 mg total) by mouth every 6 (six) hours as needed. 15 tablet 0  . zolpidem (AMBIEN) 5 MG tablet Take 1 tablet (5 mg total) by mouth at bedtime as needed for sleep. 30 tablet 0   No current facility-administered medications on file prior to visit.    Allergies  Allergen Reactions  . Ace Inhibitors     Pseudoasthma/ renal failure    Family History  Problem Relation Age of Onset  . COPD Mother     DECEASED/SMOKED    Social History   Social History  . Marital Status: Married    Spouse Name: N/A  . Number of Children: 3  . Years of Education: 12   Occupational History  . Multimedia programmer business    Social History Main Topics  . Smoking status: Current Every Day Smoker -- 1.00 packs/day for 53 years    Types: Cigarettes  . Smokeless tobacco: Never Used  . Alcohol Use: 0.0 oz/week    0 Standard drinks or equivalent per week     Comment:  "drink very rarely"  . Drug Use: No     Comment: hx of cocaine last in 2006  . Sexual Activity: No   Other Topics Concern  . None   Social History Narrative   Lives in [place] with her husband.    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    Patient has 3 grown children that do not live with her.    Patient does not have any medical insurance.   Review of Systems - See HPI.  All other ROS are negative.  BP 130/86 mmHg  Pulse 78  Temp(Src) 98.3 F (36.8 C) (Oral)  Resp 16  Ht 5\' 1"  (1.549 m)  Wt 143 lb 6 oz (65.034 kg)  BMI 27.10 kg/m2  SpO2 96%  Physical Exam  Constitutional: She is oriented to person, place, and time and well-developed, well-nourished, and in no distress.  HENT:  Head: Normocephalic  and atraumatic.  Right Ear: External ear normal.  Left Ear: External ear normal.  Nose: Nose normal.  Mouth/Throat: Oropharynx is clear and moist. No oropharyngeal exudate.  + TTP of sinuses noted.  Eyes: Conjunctivae are normal.  Neck: Neck supple.  Cardiovascular: Normal rate, regular rhythm, normal heart sounds and intact distal pulses.   Pulmonary/Chest: Effort normal. No respiratory distress. She has wheezes. She has no rales. She exhibits no tenderness.  Neurological: She is alert and oriented to person, place, and time.  Skin: Skin is warm and dry. No rash noted.  Psychiatric: Affect normal.  Vitals reviewed.   Recent Results (from the past 2160 hour(s))  Pulmonary function test     Status: None   Collection Time: 04/20/15  1:01 PM  Result Value Ref Range   FVC-Pre 2.36 L   FVC-%Pred-Pre 86 %   FVC-Post 2.48 L   FVC-%Pred-Post 90 %   FVC-%Change-Post 4 %   FEV1-Pre 1.75 L    FEV1-%Pred-Pre 84 %   FEV1-Post 1.81 L   FEV1-%Pred-Post 87 %   FEV1-%Change-Post 3 %   FEV6-Pre 2.33 L   FEV6-%Pred-Pre 89 %   FEV6-Post 2.43 L   FEV6-%Pred-Post 93 %   FEV6-%Change-Post 4 %   Pre FEV1/FVC ratio 74 %   FEV1FVC-%Pred-Pre 96 %   Post FEV1/FVC ratio 73 %   FEV1FVC-%Change-Post -1 %   Pre FEV6/FVC Ratio 100 %   FEV6FVC-%Pred-Pre 103 %   Post FEV6/FVC ratio 98 %   FEV6FVC-%Pred-Post 102 %   FEV6FVC-%Change-Post -1 %   FEF 25-75 Pre 1.30 L/sec   FEF2575-%Pred-Pre 70 %   FEF 25-75 Post 1.47 L/sec   FEF2575-%Pred-Post 79 %   FEF2575-%Change-Post 12 %   DLCO unc 19.62 ml/min/mmHg   DLCO unc % pred 97 %   DL/VA 4.41 ml/min/mmHg/L   DL/VA % pred 768 %  Basic metabolic panel     Status: Abnormal   Collection Time: 04/20/15  2:34 PM  Result Value Ref Range   Sodium 137 135 - 145 mEq/L   Potassium 3.9 3.5 - 5.1 mEq/L   Chloride 101 96 - 112 mEq/L   CO2 26 19 - 32 mEq/L   Glucose, Bld 96 70 - 99 mg/dL   BUN 25 (H) 6 - 23 mg/dL   Creatinine, Ser 1.62 (H) 0.40 - 1.20 mg/dL   Calcium 10.8 (H) 8.4 - 10.5 mg/dL   GFR 33.66 (L) >60.00 mL/min    Assessment/Plan: Acute frontal sinusitis Rx Augmentin. Mucinex BID. Saline nasal spray. Supportive measures reviewed.  Asthmatic bronchitis Albuterol neb solution and Symbicort refilled. Rx Medrol dose pack to use as directed. Mucinex BID. Follow-up if not resolving.

## 2015-06-26 NOTE — Patient Instructions (Signed)
Please take antibiotic as directed. Increase fluid intake and rest. Use Mucinex twice daily for congestion. Continue Symbicort and Albuterol neb treatments. Take prednisone pack as directed.  Follow-up if symptoms are not improving.

## 2015-06-26 NOTE — Progress Notes (Signed)
Pre visit review using our clinic review tool, if applicable. No additional management support is needed unless otherwise documented below in the visit note/SLS  

## 2015-06-26 NOTE — Assessment & Plan Note (Signed)
Rx Augmentin. Mucinex BID. Saline nasal spray. Supportive measures reviewed.

## 2015-06-26 NOTE — Assessment & Plan Note (Signed)
Albuterol neb solution and Symbicort refilled. Rx Medrol dose pack to use as directed. Mucinex BID. Follow-up if not resolving.

## 2015-08-03 ENCOUNTER — Encounter: Payer: Self-pay | Admitting: Physician Assistant

## 2015-08-03 ENCOUNTER — Ambulatory Visit (INDEPENDENT_AMBULATORY_CARE_PROVIDER_SITE_OTHER): Payer: Medicare Other | Admitting: Physician Assistant

## 2015-08-03 VITALS — BP 126/88 | HR 85 | Temp 98.0°F | Resp 16 | Ht 61.0 in | Wt 143.4 lb

## 2015-08-03 DIAGNOSIS — Z720 Tobacco use: Secondary | ICD-10-CM | POA: Diagnosis not present

## 2015-08-03 DIAGNOSIS — R739 Hyperglycemia, unspecified: Secondary | ICD-10-CM | POA: Diagnosis not present

## 2015-08-03 DIAGNOSIS — Z23 Encounter for immunization: Secondary | ICD-10-CM

## 2015-08-03 DIAGNOSIS — N183 Chronic kidney disease, stage 3 unspecified: Secondary | ICD-10-CM

## 2015-08-03 DIAGNOSIS — G4700 Insomnia, unspecified: Secondary | ICD-10-CM

## 2015-08-03 DIAGNOSIS — N179 Acute kidney failure, unspecified: Secondary | ICD-10-CM | POA: Diagnosis not present

## 2015-08-03 DIAGNOSIS — R3989 Other symptoms and signs involving the genitourinary system: Secondary | ICD-10-CM | POA: Diagnosis not present

## 2015-08-03 DIAGNOSIS — R399 Unspecified symptoms and signs involving the genitourinary system: Secondary | ICD-10-CM | POA: Insufficient documentation

## 2015-08-03 LAB — POCT URINALYSIS DIPSTICK
Glucose, UA: NEGATIVE
KETONES UA: NEGATIVE
Nitrite, UA: POSITIVE
PROTEIN UA: NEGATIVE
SPEC GRAV UA: 1.025
pH, UA: 6

## 2015-08-03 LAB — BASIC METABOLIC PANEL
BUN: 17 mg/dL (ref 6–23)
CHLORIDE: 102 meq/L (ref 96–112)
CO2: 25 meq/L (ref 19–32)
Calcium: 9.9 mg/dL (ref 8.4–10.5)
Creatinine, Ser: 1.37 mg/dL — ABNORMAL HIGH (ref 0.40–1.20)
GFR: 40.8 mL/min — ABNORMAL LOW (ref >60.00)
GLUCOSE: 97 mg/dL (ref 70–99)
Potassium: 4 mEq/L (ref 3.5–5.1)
Sodium: 137 mEq/L (ref 135–145)

## 2015-08-03 LAB — HEMOGLOBIN A1C: Hgb A1c MFr Bld: 5.5 % (ref 4.6–6.5)

## 2015-08-03 MED ORDER — CEPHALEXIN 500 MG PO CAPS: 500.0000 mg | ORAL_CAPSULE | Freq: Two times a day (BID) | ORAL | Status: DC

## 2015-08-03 MED ORDER — BUPROPION HCL 75 MG PO TABS: 75.0000 mg | ORAL_TABLET | Freq: Two times a day (BID) | ORAL | Status: DC

## 2015-08-03 MED ORDER — ZOLPIDEM TARTRATE 5 MG PO TABS: 5.0000 mg | ORAL_TABLET | Freq: Every evening | ORAL | Status: DC | PRN

## 2015-08-03 NOTE — Patient Instructions (Addendum)
Please go to the lab for blood work. I will call you with your results. Stay hydrated and eat a more well-balanced diet.  Start the Ambien and the Wellbutrin as directed. Do not keep the TV while trying to sleep it is stimulating your brain to be active.  Your symptoms are consistent with a bladder infection, also called acute cystitis. Please take your antibiotic (Keflex) as directed until all pills are gone.  Stay very well hydrated.  Consider a daily probiotic (Align, Culturelle, or Activia) to help prevent stomach upset caused by the antibiotic.  Taking a probiotic daily may also help prevent recurrent UTIs.  Also consider taking AZO (Phenazopyridine) tablets to help decrease pain with urination.  I will call you with your urine testing results.  We will change antibiotics if indicated.  Call or return to clinic if symptoms are not resolved by completion of antibiotic.   Urinary Tract Infection A urinary tract infection (UTI) can occur any place along the urinary tract. The tract includes the kidneys, ureters, bladder, and urethra. A type of germ called bacteria often causes a UTI. UTIs are often helped with antibiotic medicine.  HOME CARE   If given, take antibiotics as told by your doctor. Finish them even if you start to feel better.  Drink enough fluids to keep your pee (urine) clear or pale yellow.  Avoid tea, drinks with caffeine, and bubbly (carbonated) drinks.  Pee often. Avoid holding your pee in for a long time.  Pee before and after having sex (intercourse).  Wipe from front to back after you poop (bowel movement) if you are a woman. Use each tissue only once. GET HELP RIGHT AWAY IF:   You have back pain.  You have lower belly (abdominal) pain.  You have chills.  You feel sick to your stomach (nauseous).  You throw up (vomit).  Your burning or discomfort with peeing does not go away.  You have a fever.  Your symptoms are not better in 3 days. MAKE SURE YOU:    Understand these instructions.  Will watch your condition.  Will get help right away if you are not doing well or get worse. Document Released: 03/24/2008 Document Revised: 06/30/2012 Document Reviewed: 05/06/2012 [facility] Patient Information 2015 [place], [place]. This information is not intended to replace advice given to you by your health care provider. Make sure you discuss any questions you have with your health care provider.

## 2015-08-03 NOTE — Assessment & Plan Note (Signed)
Urine dip + LE and nitrites. Will send for culture. Supportive measures reviewed. Rx Keflex 500 mg BID. Will alter regimen based on culture results.

## 2015-08-03 NOTE — Assessment & Plan Note (Signed)
Sleep hygiene reviewed. Will refill Ambien short-term while she makes some changes to her bedtime routine.

## 2015-08-03 NOTE — Progress Notes (Signed)
Patient presents to clinic today c/o urinary frequency, urgency, incomplete bladder emptying and mild low back pain x 2 days. Denies fever, chills, nausea and vomiting.  Patient also endorses craving sweets over the past month. Has not been eating a well-balanced diet. Is concerned about diabetes. Body mass index is 27.1 kg/(m^2).Marland Kitchen There is also strong family history of DM II.   Patient would like to restart Wellbutrin for smoking cessation. Was previously prescribed but did not take as directed. Is smoking about 1 ppd currently.  Patient also endorses difficulty staying asleep. Is falling asleep around but is awake around 1 or 2. Denies increased stress or anxiety. Is sleeping with the TV on in her bedroom to drown out "outside noises" that frightens her.   Past Medical History  Diagnosis Date  . Arthritis   . COPD (chronic obstructive pulmonary disease)     no history of PFTs, only diagnosed by CXR, not on any inhalers because she has not had a PCP in over 2 years  . Hypertension     previously on Lisinopril/HCTZ  . History of heartburn   . Hypercholesteremia     previously on pravastatin   . Cholelithiasis 2011    s/p cholescystectomy   . Renal cell carcinoma 01/2010    s/p right radical nephrectomy 01/2010,  follow[facility]  . Murmur     no  prior work-up  . GERD (gastroesophageal reflux disease)   . Emphysema 2010    dx'd/CXR  . Shortness of breath 09/26/11    "lately all the time"  . Pneumonia 1980's    "walking"    Current Outpatient Prescriptions on File Prior to Visit  Medication Sig Dispense Refill  . albuterol (PROVENTIL) (2.5 MG/3ML) 0.083% nebulizer solution Take 3 mLs (2.5 mg total) by nebulization every 6 (six) hours as needed for wheezing or shortness of breath. 150 mL 1  . budesonide-formoterol (SYMBICORT) 160-4.5 MCG/ACT inhaler Inhale 2 puffs into the lungs 2 (two) times daily. 1 Inhaler 3  . losartan-hydrochlorothiazide (HYZAAR) 100-12.5 MG  per tablet Take 1 tablet by mouth daily. 30 tablet 2   No current facility-administered medications on file prior to visit.    Allergies  Allergen Reactions  . Ace Inhibitors     Pseudoasthma/ renal failure    Family History  Problem Relation Age of Onset  . COPD Mother     DECEASED/SMOKED    Social History   Social History  . Marital Status: Married    Spouse Name: N/A  . Number of Children: 3  . Years of Education: 12   Occupational History  . Multimedia programmer business   Social History Main Topics  . Smoking status: Current Every Day Smoker -- 1.00 packs/day for 53 years    Types: Cigarettes  . Smokeless tobacco: Never Used  . Alcohol Use: 0.0 oz/week    0 Standard drinks or equivalent per week     Comment:  "drink very rarely"  . Drug Use: No     Comment: hx of cocaine last in 2006  . Sexual Activity: No   Other Topics Concern  . Not on file   Social History Narrative   Lives in [place] with her husband.    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    Patient has 3 grown children that do not live with her.    Patient does not have any medical insurance.  Review of Systems - See HPI.  All other ROS are negative.  BP 126/88 mmHg  Pulse 85  Temp(Src) 98 F (36.7 C) (Oral)  Resp 16  Ht 5\' 1"  (1.549 m)  Wt 143 lb 6 oz (65.034 kg)  BMI 27.10 kg/m2  SpO2 100%  Physical Exam  Constitutional: She is oriented to person, place, and time and well-developed, well-nourished, and in no distress.  HENT:  Head: Normocephalic and atraumatic.  Eyes: Conjunctivae are normal.  Neck: Neck supple.  Cardiovascular: Normal rate, regular rhythm, normal heart sounds and intact distal pulses.   Pulmonary/Chest: Effort normal and breath sounds normal. No respiratory distress. She has no wheezes. She has no rales. She exhibits no tenderness.  Neurological: She is alert and oriented to person, place, and time.  Skin: Skin is  warm and dry. No rash noted.  Psychiatric: Affect normal.  Vitals reviewed.   No results found for this or any previous visit (from the past 2160 hour(s)).  Assessment/Plan: Insomnia Sleep hygiene reviewed. Will refill Ambien short-term while she makes some changes to her bedtime routine.  Acute renal failure superimposed on stage 3 chronic kidney disease Will repeat BMP to assess stability of renal function.  Need for prophylactic vaccination and inoculation against influenza Flu shot given today.  Tobacco abuse disorder Will restart Wellbutrin at low dose (75 mg BID) due to renal function. Follow-up 1 month.  UTI symptoms Urine dip + LE and nitrites. Will send for culture. Supportive measures reviewed. Rx Keflex 500 mg BID. Will alter regimen based on culture results.  Hyperglycemia Will check BMP and A1C today.

## 2015-08-03 NOTE — Progress Notes (Signed)
Pre visit review using our clinic review tool, if applicable. No additional management support is needed unless otherwise documented below in the visit note/SLS  

## 2015-08-03 NOTE — Assessment & Plan Note (Signed)
Will repeat BMP to assess stability of renal function.

## 2015-08-03 NOTE — Assessment & Plan Note (Signed)
Flu shot given today

## 2015-08-03 NOTE — Assessment & Plan Note (Signed)
Will check BMP and A1C today.

## 2015-08-03 NOTE — Assessment & Plan Note (Signed)
Will restart Wellbutrin at low dose (75 mg BID) due to renal function. Follow-up 1 month.

## 2015-08-06 LAB — CULTURE, URINE COMPREHENSIVE

## 2015-08-07 ENCOUNTER — Telehealth: Payer: Self-pay | Admitting: *Deleted

## 2015-08-07 NOTE — Telephone Encounter (Signed)
Error//AB/CMA 

## 2015-08-19 ENCOUNTER — Telehealth: Payer: Self-pay | Admitting: Medical

## 2015-08-19 DIAGNOSIS — F17200 Nicotine dependence, unspecified, uncomplicated: Secondary | ICD-10-CM

## 2015-08-19 NOTE — Telephone Encounter (Signed)
Ct chest lung cancer screening based on history of smoking. I put in smoker dx. Would you coordinate with radiology to put in appropiate dx that medicare will cover. She smoked for 52 years. Just could not find dx in epic. Had to choose just smoker. But I think their is screening dx that could be associated as well.

## 2015-08-27 ENCOUNTER — Telehealth: Payer: Self-pay | Admitting: *Deleted

## 2015-08-27 NOTE — Telephone Encounter (Signed)
Questions about tests that have been ordered  Received: Today    [facility]enn[facility] Laity, [place]    Phone Number: [phone]          Pt says she got a call about a test that was ordered and she doesn't know what it is or why.      On 08/19/15, Mackie Pai ordered a CT Chest Lung CA Screen Low Dose w/o CM; unsure as of why and will address tomorrow to PCP, have placed the process of this Order on Hold with [facility] as well. LMOM with contact name and number RE: Inquiry on this matter and further information as to giving her a call back tomorrow after PCP has time to speak with Percell Miller regarding this matter/SLS

## 2015-08-28 NOTE — Telephone Encounter (Signed)
Spoke with patient about purpose of Imaging ordered and patient Declines at this time; [facility] & provider informed/SLS

## 2015-10-24 ENCOUNTER — Telehealth: Payer: Self-pay | Admitting: Physician Assistant

## 2015-10-24 NOTE — Telephone Encounter (Signed)
error:[id number] ° °

## 2015-10-26 ENCOUNTER — Ambulatory Visit (HOSPITAL_BASED_OUTPATIENT_CLINIC_OR_DEPARTMENT_OTHER)
Admission: RE | Admit: 2015-10-26 | Discharge: 2015-10-26 | Disposition: A | Discharge status: Home / Self Care | Payer: Medicare Other | Source: Ambulatory Visit | Attending: Medical | Admitting: Medical

## 2015-10-26 ENCOUNTER — Encounter: Payer: Self-pay | Admitting: Medical

## 2015-10-26 ENCOUNTER — Ambulatory Visit (INDEPENDENT_AMBULATORY_CARE_PROVIDER_SITE_OTHER): Payer: Medicare Other | Admitting: Medical

## 2015-10-26 VITALS — BP 120/80 | HR 87 | Temp 97.8°F | Ht 61.0 in | Wt 143.0 lb

## 2015-10-26 DIAGNOSIS — J45909 Unspecified asthma, uncomplicated: Secondary | ICD-10-CM | POA: Diagnosis not present

## 2015-10-26 DIAGNOSIS — R0989 Other specified symptoms and signs involving the circulatory and respiratory systems: Secondary | ICD-10-CM | POA: Diagnosis not present

## 2015-10-26 DIAGNOSIS — R062 Wheezing: Secondary | ICD-10-CM

## 2015-10-26 DIAGNOSIS — J209 Acute bronchitis, unspecified: Secondary | ICD-10-CM

## 2015-10-26 DIAGNOSIS — Z87891 Personal history of nicotine dependence: Secondary | ICD-10-CM | POA: Insufficient documentation

## 2015-10-26 DIAGNOSIS — R05 Cough: Secondary | ICD-10-CM | POA: Insufficient documentation

## 2015-10-26 DIAGNOSIS — C649 Malignant neoplasm of unspecified kidney, except renal pelvis: Secondary | ICD-10-CM | POA: Diagnosis not present

## 2015-10-26 DIAGNOSIS — R059 Cough, unspecified: Secondary | ICD-10-CM

## 2015-10-26 LAB — CBC WITH DIFFERENTIAL/PLATELET
BASOS PCT: 0.7 % (ref 0.0–3.0)
Basophils Absolute: 0.1 10*3/uL (ref 0.0–0.1)
EOS PCT: 2.2 % (ref 0.0–5.0)
Eosinophils Absolute: 0.2 10*3/uL (ref 0.0–0.7)
HCT: 38.5 % (ref 36.0–46.0)
HEMOGLOBIN: 12.7 g/dL (ref 12.0–15.0)
LYMPHS ABS: 1.9 10*3/uL (ref 0.7–4.0)
Lymphocytes Relative: 19.7 % (ref 12.0–46.0)
MCHC: 33 g/dL (ref 30.0–36.0)
MCV: 87.7 fl (ref 78.0–100.0)
MONOS PCT: 8 % (ref 3.0–12.0)
Monocytes Absolute: 0.8 10*3/uL (ref 0.1–1.0)
Neutro Abs: 6.8 10*3/uL (ref 1.4–7.7)
Neutrophils Relative %: 69.4 % (ref 43.0–77.0)
Platelets: 422 10*3/uL — ABNORMAL HIGH (ref 150.0–400.0)
RBC: 4.39 Mil/uL (ref 3.87–5.11)
RDW: 13.5 % (ref 11.5–15.5)
WBC: 9.8 10*3/uL (ref 4.0–10.5)

## 2015-10-26 MED ORDER — PREDNISONE 20 MG PO TABS
ORAL_TABLET | ORAL | Status: DC
Start: 2015-10-26 — End: 2016-04-17

## 2015-10-26 MED ORDER — AZITHROMYCIN 250 MG PO TABS: ORAL_TABLET | ORAL | Status: DC

## 2015-10-26 MED ORDER — HYDROCODONE-HOMATROPINE 5-1.5 MG/5ML PO SYRP: 5.0000 mL | ORAL_SOLUTION | Freq: Three times a day (TID) | ORAL | Status: DC | PRN

## 2015-10-26 NOTE — Patient Instructions (Addendum)
You appear to have bronchitis. Rest hydrate and tylenol for fever. I am prescribing cough medicine hycodan, and azithromycin antibiotic.   Continue your inhalers. If your wheezing get worse start taper prednisone  Get cxr today and cbc.(evaluate if pneumonia)  Follow up in 7-10 days or as needed

## 2015-10-26 NOTE — Progress Notes (Signed)
Pre visit review using our clinic review tool, if applicable. No additional management support is needed unless otherwise documented below in the visit note. 

## 2015-10-26 NOTE — Progress Notes (Signed)
Subjective:    Patient ID: Katie [name], female    DOB: 1946-10-08, 70 y.o.   MRN: [id number]  HPI  Pt in sick for 10 day. At first runny nose and nasal congestion. But gradually worsening and now has chest congestion. Some productive cough.  Pt has some asthma. Some wheezing. Hx of chronic bronchitis. Pt is on symbicort daily. Using albuterol just once a day.   Review of Systems  Constitutional: Positive for fever and fatigue. Negative for chills and diaphoresis.       Subjective fever.  HENT: Positive for congestion. Negative for facial swelling, postnasal drip, rhinorrhea and sinus pressure.   Respiratory: Positive for cough. Negative for chest tightness, shortness of breath and wheezing.   Cardiovascular: Negative for chest pain and palpitations.  Gastrointestinal: Negative for abdominal pain.  Musculoskeletal: Negative for myalgias and back pain.  Hematological: Negative for adenopathy. Does not bruise/bleed easily.   Past Medical History  Diagnosis Date  . Arthritis   . COPD (chronic obstructive pulmonary disease) (HCC)     no history of PFTs, only diagnosed by CXR, not on any inhalers because she has not had a PCP in over 2 years  . Hypertension     previously on Lisinopril/HCTZ  . History of heartburn   . Hypercholesteremia     previously on pravastatin   . Cholelithiasis 2011    s/p cholescystectomy   . Renal cell carcinoma 01/2010    s/p right radical nephrectomy 01/2010,  [facility]  . Murmur     no  prior work-up  . GERD (gastroesophageal reflux disease)   . Emphysema 2010    dx'd/CXR  . Shortness of breath 09/26/11    "lately all the time"  . Pneumonia 1980's    "walking"    Social History   Social History  . Marital Status: Married    Spouse Name: N/A  . Number of Children: 3  . Years of Education: 12   Occupational History  . Multimedia programmer business   Social History Main Topics  . Smoking status: Current  Every Day Smoker -- 1.00 packs/day for 53 years    Types: Cigarettes  . Smokeless tobacco: Never Used  . Alcohol Use: 0.0 oz/week    0 Standard drinks or equivalent per week     Comment:  "drink very rarely"  . Drug Use: No     Comment: hx of cocaine last in 2006  . Sexual Activity: No   Other Topics Concern  . Not on file   Social History Narrative   Lives in [place] with her husband.    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    Patient has 3 grown children that do not live with her.    Patient does not have any medical insurance.    Past Surgical History  Procedure Laterality Date  . Cholecystectomy  01/2010  . Nephrectomy radical  01/2010    right   . Tubal ligation  1970's  . Diagnostic laparoscopy    . Ovarian cyst removal  1970's    "went through belly button"  . Appendectomy  1970's  . Tee without cardioversion  10/01/2011    Procedure: TRANSESOPHAGEAL ECHOCARDIOGRAM (TEE);  Surgeon: [name];  Location: [place];  Service: Cardiovascular;  Laterality: N/A;  . US echocardiography  09/2011  . Cardiac catheterization  09/2011  . Multiple extractions with alveoloplasty  10/06/2011  Procedure: MULTIPLE EXTRACION WITH ALVEOLOPLASTY;  Surgeon: Lenn Cal, DDS;  Location: [place];  Service: Oral Surgery;  Laterality: N/A;  Multiple extraction of tooth #'s 1, 6, 8, 10, 11, 22, 23, 26, 27, 28, 29 with alveoloplasty and Upper right buccal exostoses reductions.  . Left and right heart catheterization with coronary angiogram N/A 09/30/2011    Procedure: LEFT AND RIGHT HEART CATHETERIZATION WITH CORONARY ANGIOGRAM;  Surgeon: Candee Furbish, MD;  Location: [facility] CATH LAB;  Service: Cardiovascular;  Laterality: N/A;    Family History  Problem Relation Age of Onset  . COPD Mother     DECEASED/SMOKED    Allergies  Allergen Reactions  . Ace Inhibitors     Pseudoasthma/ renal failure    Current Outpatient Prescriptions on  File Prior to Visit  Medication Sig Dispense Refill  . albuterol (PROVENTIL) (2.5 MG/3ML) 0.083% nebulizer solution Take 3 mLs (2.5 mg total) by nebulization every 6 (six) hours as needed for wheezing or shortness of breath. 150 mL 1  . budesonide-formoterol (SYMBICORT) 160-4.5 MCG/ACT inhaler Inhale 2 puffs into the lungs 2 (two) times daily. 1 Inhaler 3  . buPROPion (WELLBUTRIN) 75 MG tablet Take 1 tablet (75 mg total) by mouth 2 (two) times daily. 60 tablet 1  . cephALEXin (KEFLEX) 500 MG capsule Take 1 capsule (500 mg total) by mouth 2 (two) times daily. 14 capsule 0  . losartan-hydrochlorothiazide (HYZAAR) 100-12.5 MG per tablet Take 1 tablet by mouth daily. 30 tablet 2  . zolpidem (AMBIEN) 5 MG tablet Take 1 tablet (5 mg total) by mouth at bedtime as needed for sleep. 30 tablet 0   No current facility-administered medications on file prior to visit.    BP 120/80 mmHg  Pulse 87  Temp(Src) 97.8 F (36.6 C) (Oral)  Ht 5\' 1"  (1.549 m)  Wt 143 lb (64.864 kg)  BMI 27.03 kg/m2  SpO2 98%       Objective:   Physical Exam   General  Mental Status - Alert. General Appearance - Well groomed. Not in acute distress.  Skin Rashes- No Rashes.  HEENT Head- Normal. Ear Auditory Canal - Left- Normal. Right - Normal.Tympanic Membrane- Left- Normal. Right- Normal. Eye Sclera/Conjunctiva- Left- Normal. Right- Normal. Nose & Sinuses Nasal Mucosa- Left-  Not boggy or Congested. Right-  Not  boggy or Congested. No sinus pressure Mouth & Throat Lips: Upper Lip- Normal: no dryness, cracking, pallor, cyanosis, or vesicular eruption. Lower Lip-Normal: no dryness, cracking, pallor, cyanosis or vesicular eruption. Buccal Mucosa- Bilateral- No Aphthous ulcers. Oropharynx- No Discharge or Erythema. Tonsils: Characteristics- Bilateral- No Erythema or Congestion. Size/Enlargement- Bilateral- No enlargement. Discharge- bilateral-None.  Neck Neck- Supple. No Masses.   Chest and Lung  Exam Auscultation: Breath Sounds:- even and unlabored, but bilateral diffuse rhonchi.  Cardiovascular Auscultation:Rythm- Regular, rate and rhythm. Murmurs & Other Heart Sounds:Ausculatation of the heart reveal- No Murmurs.  Lymphatic Head & Neck General Head & Neck Lymphatics: Bilateral: Description- No Localized lymphadenopathy.      Assessment & Plan:  You appear to have bronchitis. Rest hydrate and tylenol for fever. I am prescribing cough medicine hycodan, and azithromycin antibiotic.   Continue your inhalers. If your wheezing get worse start taper prednisone  Get cxr today and cbc.(evaluate if pneumonia)  Follow up in 7-10 days or as needed

## 2015-11-23 ENCOUNTER — Telehealth: Payer: Self-pay | Admitting: Physician Assistant

## 2015-11-23 NOTE — Telephone Encounter (Signed)
LM for pt to call and schedule flu shot or update records. °

## 2016-04-17 ENCOUNTER — Observation Stay (HOSPITAL_COMMUNITY)
Admission: EM | Admit: 2016-04-17 | Discharge: 2016-04-19 | Disposition: A | Discharge status: Home / Self Care | Payer: Medicare Other | Attending: Internal Medicine | Admitting: Internal Medicine

## 2016-04-17 ENCOUNTER — Emergency Department (HOSPITAL_COMMUNITY): Payer: Medicare Other

## 2016-04-17 ENCOUNTER — Encounter (HOSPITAL_COMMUNITY): Payer: Self-pay

## 2016-04-17 DIAGNOSIS — I340 Nonrheumatic mitral (valve) insufficiency: Secondary | ICD-10-CM | POA: Diagnosis not present

## 2016-04-17 DIAGNOSIS — I129 Hypertensive chronic kidney disease with stage 1 through stage 4 chronic kidney disease, or unspecified chronic kidney disease: Secondary | ICD-10-CM | POA: Diagnosis not present

## 2016-04-17 DIAGNOSIS — R399 Unspecified symptoms and signs involving the genitourinary system: Secondary | ICD-10-CM | POA: Diagnosis present

## 2016-04-17 DIAGNOSIS — J449 Chronic obstructive pulmonary disease, unspecified: Secondary | ICD-10-CM | POA: Diagnosis not present

## 2016-04-17 DIAGNOSIS — N390 Urinary tract infection, site not specified: Secondary | ICD-10-CM | POA: Diagnosis not present

## 2016-04-17 DIAGNOSIS — I421 Obstructive hypertrophic cardiomyopathy: Secondary | ICD-10-CM | POA: Insufficient documentation

## 2016-04-17 DIAGNOSIS — I160 Hypertensive urgency: Secondary | ICD-10-CM | POA: Insufficient documentation

## 2016-04-17 DIAGNOSIS — Z9049 Acquired absence of other specified parts of digestive tract: Secondary | ICD-10-CM | POA: Diagnosis not present

## 2016-04-17 DIAGNOSIS — F17210 Nicotine dependence, cigarettes, uncomplicated: Secondary | ICD-10-CM | POA: Insufficient documentation

## 2016-04-17 DIAGNOSIS — Z7982 Long term (current) use of aspirin: Secondary | ICD-10-CM | POA: Insufficient documentation

## 2016-04-17 DIAGNOSIS — Z905 Acquired absence of kidney: Secondary | ICD-10-CM | POA: Diagnosis not present

## 2016-04-17 DIAGNOSIS — Z720 Tobacco use: Secondary | ICD-10-CM | POA: Diagnosis present

## 2016-04-17 DIAGNOSIS — I10 Essential (primary) hypertension: Secondary | ICD-10-CM | POA: Diagnosis not present

## 2016-04-17 DIAGNOSIS — E7800 Pure hypercholesterolemia, unspecified: Secondary | ICD-10-CM | POA: Insufficient documentation

## 2016-04-17 DIAGNOSIS — R079 Chest pain, unspecified: Secondary | ICD-10-CM | POA: Diagnosis not present

## 2016-04-17 DIAGNOSIS — I209 Angina pectoris, unspecified: Secondary | ICD-10-CM

## 2016-04-17 DIAGNOSIS — F329 Major depressive disorder, single episode, unspecified: Secondary | ICD-10-CM | POA: Diagnosis not present

## 2016-04-17 DIAGNOSIS — N183 Chronic kidney disease, stage 3 (moderate): Secondary | ICD-10-CM | POA: Diagnosis not present

## 2016-04-17 DIAGNOSIS — E785 Hyperlipidemia, unspecified: Secondary | ICD-10-CM | POA: Diagnosis present

## 2016-04-17 DIAGNOSIS — R0602 Shortness of breath: Secondary | ICD-10-CM | POA: Diagnosis not present

## 2016-04-17 DIAGNOSIS — Z79899 Other long term (current) drug therapy: Secondary | ICD-10-CM | POA: Diagnosis not present

## 2016-04-17 DIAGNOSIS — C649 Malignant neoplasm of unspecified kidney, except renal pelvis: Secondary | ICD-10-CM | POA: Diagnosis present

## 2016-04-17 DIAGNOSIS — Z85528 Personal history of other malignant neoplasm of kidney: Secondary | ICD-10-CM | POA: Diagnosis not present

## 2016-04-17 HISTORY — DX: Chest pain, unspecified: R07.9

## 2016-04-17 LAB — CBC WITH DIFFERENTIAL/PLATELET
BASOS ABS: 0 10*3/uL (ref 0.0–0.1)
BASOS PCT: 1 %
Eosinophils Absolute: 0.2 10*3/uL (ref 0.0–0.7)
Eosinophils Relative: 4 %
HCT: 39.4 % (ref 36.0–46.0)
Hemoglobin: 13.1 g/dL (ref 12.0–15.0)
LYMPHS ABS: 2.2 10*3/uL (ref 0.7–4.0)
LYMPHS PCT: 36 %
MCH: 29.4 pg (ref 26.0–34.0)
MCHC: 33.2 g/dL (ref 30.0–36.0)
MCV: 88.3 fL (ref 78.0–100.0)
Monocytes Absolute: 0.3 10*3/uL (ref 0.1–1.0)
Monocytes Relative: 5 %
NEUTROS ABS: 3.4 10*3/uL (ref 1.7–7.7)
NEUTROS PCT: 54 %
PLATELETS: 304 10*3/uL (ref 150–400)
RBC: 4.46 MIL/uL (ref 3.87–5.11)
RDW: 13.2 % (ref 11.5–15.5)
WBC: 6.3 10*3/uL (ref 4.0–10.5)

## 2016-04-17 LAB — BASIC METABOLIC PANEL
ANION GAP: 7 (ref 5–15)
BUN: 15 mg/dL (ref 6–20)
CHLORIDE: 108 mmol/L (ref 101–111)
CO2: 24 mmol/L (ref 22–32)
Calcium: 9.6 mg/dL (ref 8.9–10.3)
Creatinine, Ser: 1.28 mg/dL — ABNORMAL HIGH (ref 0.44–1.00)
GFR calc Af Amer: 49 mL/min — ABNORMAL LOW (ref >60)
GFR, EST NON AFRICAN AMERICAN: 42 mL/min — AB (ref >60)
Glucose, Bld: 88 mg/dL (ref 65–99)
POTASSIUM: 4.2 mmol/L (ref 3.5–5.1)
SODIUM: 139 mmol/L (ref 135–145)

## 2016-04-17 LAB — TROPONIN I
Troponin I: <0.03 ng/mL (ref <0.03)
Troponin I: <0.03 ng/mL (ref <0.03)

## 2016-04-17 LAB — I-STAT TROPONIN, ED: TROPONIN I, POC: 0 ng/mL (ref 0.00–0.08)

## 2016-04-17 MED ORDER — LOSARTAN POTASSIUM-HCTZ 100-12.5 MG PO TABS: 1.0000 | ORAL_TABLET | Freq: Every day | ORAL | Status: DC

## 2016-04-17 MED ORDER — MOMETASONE FURO-FORMOTEROL FUM 200-5 MCG/ACT IN AERO
2.0000 | INHALATION_SPRAY | Freq: Two times a day (BID) | RESPIRATORY_TRACT | Status: DC
  Administered 2016-04-17 – 2016-04-18 (×3): 2 via RESPIRATORY_TRACT
  Filled 2016-04-17: qty 8.8

## 2016-04-17 MED ORDER — ONDANSETRON HCL 4 MG/2ML IJ SOLN: 4.0000 mg | Freq: Four times a day (QID) | INTRAMUSCULAR | Status: DC | PRN

## 2016-04-17 MED ORDER — ZOLPIDEM TARTRATE 5 MG PO TABS
5.0000 mg | ORAL_TABLET | Freq: Every evening | ORAL | Status: DC | PRN
  Administered 2016-04-18: 5 mg via ORAL
  Filled 2016-04-17: qty 1

## 2016-04-17 MED ORDER — HYDROCHLOROTHIAZIDE 12.5 MG PO CAPS
12.5000 mg | ORAL_CAPSULE | Freq: Every day | ORAL | Status: DC
Start: 2016-04-18 — End: 2016-04-19
  Administered 2016-04-18 – 2016-04-19 (×2): 12.5 mg via ORAL
  Filled 2016-04-17 (×2): qty 1

## 2016-04-17 MED ORDER — ASPIRIN 81 MG PO CHEW
81.0000 mg | CHEWABLE_TABLET | Freq: Every day | ORAL | Status: DC
  Administered 2016-04-18 – 2016-04-19 (×2): 81 mg via ORAL
  Filled 2016-04-17 (×2): qty 1

## 2016-04-17 MED ORDER — GI COCKTAIL ~~LOC~~
30.0000 mL | Freq: Four times a day (QID) | ORAL | Status: DC | PRN
  Administered 2016-04-17: 30 mL via ORAL
  Filled 2016-04-17: qty 30

## 2016-04-17 MED ORDER — IPRATROPIUM-ALBUTEROL 0.5-2.5 (3) MG/3ML IN SOLN
3.0000 mL | Freq: Once | RESPIRATORY_TRACT | Status: AC
  Administered 2016-04-17: 3 mL via RESPIRATORY_TRACT
  Filled 2016-04-17: qty 3

## 2016-04-17 MED ORDER — NITROGLYCERIN 2 % TD OINT
1.0000 [in_us] | TOPICAL_OINTMENT | Freq: Once | TRANSDERMAL | Status: AC
  Administered 2016-04-17: 1 [in_us] via TOPICAL
  Filled 2016-04-17: qty 1

## 2016-04-17 MED ORDER — ENOXAPARIN SODIUM 40 MG/0.4ML ~~LOC~~ SOLN
40.0000 mg | SUBCUTANEOUS | Status: DC
Start: 2016-04-17 — End: 2016-04-19
  Administered 2016-04-17 – 2016-04-18 (×2): 40 mg via SUBCUTANEOUS
  Filled 2016-04-17 (×2): qty 0.4

## 2016-04-17 MED ORDER — NITROGLYCERIN 0.4 MG SL SUBL
0.4000 mg | SUBLINGUAL_TABLET | SUBLINGUAL | Status: DC | PRN
  Administered 2016-04-17 (×5): 0.4 mg via SUBLINGUAL
  Filled 2016-04-17 (×2): qty 1

## 2016-04-17 MED ORDER — GI COCKTAIL ~~LOC~~: 30.0000 mL | Freq: Once | ORAL | Status: DC

## 2016-04-17 MED ORDER — BUPROPION HCL 75 MG PO TABS
75.0000 mg | ORAL_TABLET | Freq: Two times a day (BID) | ORAL | Status: DC
  Administered 2016-04-17 – 2016-04-19 (×4): 75 mg via ORAL
  Filled 2016-04-17 (×4): qty 1

## 2016-04-17 MED ORDER — ALBUTEROL SULFATE (2.5 MG/3ML) 0.083% IN NEBU
2.5000 mg | INHALATION_SOLUTION | Freq: Four times a day (QID) | RESPIRATORY_TRACT | Status: DC | PRN
  Administered 2016-04-18 (×2): 2.5 mg via RESPIRATORY_TRACT
  Filled 2016-04-17 (×2): qty 3

## 2016-04-17 MED ORDER — ACETAMINOPHEN 325 MG PO TABS
650.0000 mg | ORAL_TABLET | ORAL | Status: DC | PRN
  Administered 2016-04-18: 650 mg via ORAL
  Filled 2016-04-17 (×2): qty 2

## 2016-04-17 MED ORDER — LOSARTAN POTASSIUM 50 MG PO TABS: 100.0000 mg | ORAL_TABLET | Freq: Every day | ORAL | Status: DC

## 2016-04-17 MED ORDER — GI COCKTAIL ~~LOC~~
30.0000 mL | Freq: Once | ORAL | Status: AC
  Administered 2016-04-17: 30 mL via ORAL
  Filled 2016-04-17: qty 30

## 2016-04-17 MED ORDER — CARVEDILOL 6.25 MG PO TABS: 6.2500 mg | ORAL_TABLET | Freq: Two times a day (BID) | ORAL | Status: DC

## 2016-04-17 MED ORDER — HYDROCHLOROTHIAZIDE 12.5 MG PO CAPS: 12.5000 mg | ORAL_CAPSULE | Freq: Every day | ORAL | Status: DC

## 2016-04-17 MED ORDER — HYDRALAZINE HCL 20 MG/ML IJ SOLN: 10.0000 mg | Freq: Four times a day (QID) | INTRAMUSCULAR | Status: DC | PRN

## 2016-04-17 MED ORDER — LOSARTAN POTASSIUM 50 MG PO TABS
50.0000 mg | ORAL_TABLET | Freq: Every day | ORAL | Status: DC
  Administered 2016-04-18 – 2016-04-19 (×2): 50 mg via ORAL
  Filled 2016-04-17 (×2): qty 1

## 2016-04-17 MED ORDER — NICOTINE 21 MG/24HR TD PT24
21.0000 mg | MEDICATED_PATCH | Freq: Every day | TRANSDERMAL | Status: DC
Start: 2016-04-17 — End: 2016-04-19
  Administered 2016-04-18 – 2016-04-19 (×2): 21 mg via TRANSDERMAL
  Filled 2016-04-17 (×2): qty 1

## 2016-04-17 MED ORDER — MORPHINE SULFATE (PF) 2 MG/ML IV SOLN
2.0000 mg | INTRAVENOUS | Status: DC | PRN
  Filled 2016-04-17: qty 1

## 2016-04-17 MED ORDER — ASPIRIN 81 MG PO CHEW
324.0000 mg | CHEWABLE_TABLET | Freq: Once | ORAL | Status: AC
  Administered 2016-04-17: 324 mg via ORAL
  Filled 2016-04-17: qty 4

## 2016-04-17 NOTE — ED Notes (Signed)
Bed: [facility] Expected date:  Expected time:  Means of arrival:  Comments: EKG

## 2016-04-17 NOTE — ED Notes (Signed)
Called charge at 15:53 and stated will call back.

## 2016-04-17 NOTE — ED Notes (Signed)
Called flor

## 2016-04-17 NOTE — Progress Notes (Signed)
Notified MD Rosanna Randy with Cardiology about patients CP episode. Will continue to monitor.  Rosine Beat, RN

## 2016-04-17 NOTE — ED Provider Notes (Signed)
CSN: [id number]     Arrival date & time 04/17/16  1419 History   First MD Initiated Contact with Patient 04/17/16 1424     Chief Complaint  Patient presents with  . Chest Pain    The history is provided by the patient. No language interpreter was used.   Katie [name] is a 70 y.o. female who presents to the Emergency Department complaining of chest pain.  She reports intermittent chest pain starting last night. First episode was last night and lasted for several hours. Today she had recurrent chest pain starting about 2:15. Pain is described as an aching that goes across her chest into her left shoulder. She denies any fevers, nausea, diaphoresis, shortness of breath. She denies any prior similar symptoms. She has a history of renal cell carcinoma status post nephrectomy, COPD, hypertension, hyperlipidemia. She has no known cardiac disease.  Past Medical History  Diagnosis Date  . Arthritis   . COPD (chronic obstructive pulmonary disease) (HCC)     no history of PFTs, only diagnosed by CXR, not on any inhalers because she has not had a PCP in over 2 years  . Hypertension     previously on Lisinopril/HCTZ  . History of heartburn   . Hypercholesteremia     previously on pravastatin   . Cholelithiasis 2011    s/p cholescystectomy   . Renal cell carcinoma 01/2010    s/p right radical nephrectomy 01/2010,  [facility]  . Murmur     no  prior work-up  . GERD (gastroesophageal reflux disease)   . Emphysema 2010    dx'd/CXR  . Shortness of breath 09/26/11    "lately all the time"  . Pneumonia 1980's    "walking"   Past Surgical History  Procedure Laterality Date  . Cholecystectomy  01/2010  . Nephrectomy radical  01/2010    right   . Tubal ligation  1970's  . Diagnostic laparoscopy    . Ovarian cyst removal  1970's    "went through belly button"  . Appendectomy  1970's  . Tee without cardioversion  10/01/2011    Procedure: TRANSESOPHAGEAL ECHOCARDIOGRAM (TEE);   Surgeon: [name];  Location: [place];  Service: Cardiovascular;  Laterality: N/A;  . US echocardiography  09/2011  . Cardiac catheterization  09/2011  . Multiple extractions with alveoloplasty  10/06/2011    Procedure: MULTIPLE EXTRACION WITH ALVEOLOPLASTY;  Surgeon: Lenn Cal, DDS;  Location: [place];  Service: Oral Surgery;  Laterality: N/A;  Multiple extraction of tooth #'s 1, 6, 8, 10, 11, 22, 23, 26, 27, 28, 29 with alveoloplasty and Upper right buccal exostoses reductions.  . Left and right heart catheterization with coronary angiogram N/A 09/30/2011    Procedure: LEFT AND RIGHT HEART CATHETERIZATION WITH CORONARY ANGIOGRAM;  Surgeon: Candee Furbish, MD;  Location: [facility] CATH LAB;  Service: Cardiovascular;  Laterality: N/A;   Family History  Problem Relation Age of Onset  . COPD Mother     DECEASED/SMOKED   Social History  Substance Use Topics  . Smoking status: Current Every Day Smoker -- 1.00 packs/day for 53 years    Types: Cigarettes  . Smokeless tobacco: Never Used  . Alcohol Use: 0.0 oz/week    0 Standard drinks or equivalent per week     Comment:  "drink very rarely"   OB History    No data available     Review of Systems  All other systems reviewed and are negative.  Allergies  Ace inhibitors  Home Medications   Prior to Admission medications   Medication Sig Start Date End Date Taking? Authorizing Provider  albuterol (PROVENTIL) (2.5 MG/3ML) 0.083% nebulizer solution Take 3 mLs (2.5 mg total) by nebulization every 6 (six) hours as needed for wheezing or shortness of breath. 06/26/15  Yes Brunetta Jeans, PA-C  budesonide-formoterol (SYMBICORT) 160-4.5 MCG/ACT inhaler Inhale 2 puffs into the lungs 2 (two) times daily. 06/26/15  Yes Brunetta Jeans, PA-C  Ibuprofen-Diphenhydramine HCl (ADVIL PM) 200-25 MG CAPS Take 1 tablet by mouth at bedtime as needed (Sleep.).   Yes Historical Provider, MD  losartan-hydrochlorothiazide (HYZAAR) 100-12.5 MG  per tablet Take 1 tablet by mouth daily. 06/26/15  Yes Brunetta Jeans, PA-C  aspirin 81 MG chewable tablet Chew 1 tablet (81 mg total) by mouth daily. 04/18/16   Thurnell Lose, MD  buPROPion (WELLBUTRIN) 75 MG tablet Take 1 tablet (75 mg total) by mouth 2 (two) times daily. Patient not taking: Reported on 04/17/2016 08/03/15   Brunetta Jeans, PA-C  metoprolol tartrate (LOPRESSOR) 25 MG tablet Take 1 tablet (25 mg total) by mouth 2 (two) times daily. 04/18/16   Thurnell Lose, MD  nitroGLYCERIN (NITROSTAT) 0.4 MG SL tablet Place 1 tablet (0.4 mg total) under the tongue every 5 (five) minutes as needed for chest pain. 04/18/16   Thurnell Lose, MD  zolpidem (AMBIEN) 5 MG tablet Take 1 tablet (5 mg total) by mouth at bedtime as needed for sleep. Patient not taking: Reported on 04/17/2016 08/03/15   Brunetta Jeans, PA-C   BP 117/82 mmHg  Pulse 70  Temp(Src) 98.1 F (36.7 C) (Oral)  Resp 18  Ht 5\' 1"  (1.549 m)  Wt 146 lb 1.6 oz (66.271 kg)  BMI 27.62 kg/m2  SpO2 98% Physical Exam  Constitutional: She is oriented to person, place, and time. She appears well-developed and well-nourished. She appears distressed.  HENT:  Head: Normocephalic and atraumatic.  Cardiovascular: Normal rate and regular rhythm.   No murmur heard. Pulmonary/Chest: Effort normal. No respiratory distress.  End expiratory wheezes bilaterally  Abdominal: Soft. There is no tenderness. There is no rebound and no guarding.  Musculoskeletal: She exhibits no edema or tenderness.  Neurological: She is alert and oriented to person, place, and time.  Skin: Skin is warm and dry.  Psychiatric: She has a normal mood and affect. Her behavior is normal.  Nursing note and vitals reviewed.   ED Course  Procedures (including critical care time) Labs Review Labs Reviewed  BASIC METABOLIC PANEL - Abnormal; Notable for the following:    Creatinine, Ser 1.28 (*)    GFR calc non Af Amer 42 (*)    GFR calc Af Amer 49 (*)    All  other components within normal limits  LIPID PANEL - Abnormal; Notable for the following:    Cholesterol 280 (*)    Triglycerides 460 (*)    All other components within normal limits  CBC WITH DIFFERENTIAL/PLATELET  TROPONIN I  TROPONIN I  TROPONIN I  I-STAT TROPOININ, ED    Imaging Review Dg Chest Port 1 View  04/17/2016  CLINICAL DATA:  Shortness of breath and central chest pain, initial encounter EXAM: PORTABLE CHEST 1 VIEW COMPARISON:  10/26/2015 FINDINGS: The heart size and mediastinal contours are within normal limits. Both lungs are clear. The visualized skeletal structures are unremarkable. IMPRESSION: No active disease. Electronically Signed   By: Inez Catalina M.D.   On: 04/17/2016 15:02  I have personally reviewed and evaluated these images and lab results as part of my medical decision-making.   EKG Interpretation   Date/Time:  Thursday April 17 2016 14:51:49 EDT Ventricular Rate:  89 PR Interval:    QRS Duration: 85 QT Interval:  392 QTC Calculation: 477 R Axis:   36 Text Interpretation:  Sinus rhythm Confirmed by Hazle Coca [phone]) on  04/17/2016 2:55:40 PM      MDM   Final diagnoses:  Chest pain, unspecified chest pain type  Hypertensive urgency  Patient here for evaluation of chest pain that is intermittent in nature, she was markedly hypertensive on ED arrival. Her hypertension and blood pressure did improve following nitro 3. Plan to admit to medicine for further observation given hypertensive urgency with chest pain. Cardiology consulted as well.  Quintella Reichert, MD 04/18/16 1258

## 2016-04-17 NOTE — Consult Note (Signed)
Patient ID: Katie [name] MRN: [id number] DOB/AGE: [birth year] 68 y.o.  Admit date: 04/17/2016 Primary Physician [name], PA-C  Primary Cardiologist Fransico Him, MD Requesting Physician: Lala Lund, MD  Chief Complaint  Chest pain  HPI: Katie [name] is a 70F with hypertension, hyperlipidemia, HCM with SAM and severe MR, RCC, CKD 3, COPD and ongoing tobacco abuse here with chest pain and hypertensive urgency.  Katie [name] reports an episode of chest pain that occurred last nightat rest.  It was sharp, intense, substernal chest pain that was alleviated by sublingual nitroglycerin.  It was associated with shortness of breath but no nausea or diaphoresis.  She notes that she forgot to take her antihypertensives for 3-4 days.  In the ED she was hypertensive to 193/113.  Cardiac enzymes were negative and her EKG was not concerning for ischemia.  She was admitted to the hospitalist service for further evaluation and care.  She has noted intermittent foot swelling but denies orthopnea or PND.  In general she does not have shortness of breath.  She has been gaining weight lately and feels that her abdomen is bloated.   Katie [name] was admitted 09/2011 with chest pain and shortness of breath.  She was noted to have HCM with dynamic outflow track obstruction and moderate MR.  She was evaluated by cardiology CT surgery and it was felt that she did not need surgery at that time.  However, they did recommend that she have a dental evaluation for tooth extraction and close follow up with q6 month echocardiography.  Review of Systems: A 12 point review of systems was obtained and was negative with exceptions noted in the HPI.   Past Medical History  Diagnosis Date  . Arthritis   . COPD (chronic obstructive pulmonary disease) (HCC)     no history of PFTs, only diagnosed by CXR, not on any inhalers because she has not had a PCP in over 2 years  . Hypertension     previously on  Lisinopril/HCTZ  . History of heartburn   . Hypercholesteremia     previously on pravastatin   . Cholelithiasis 2011    s/p cholescystectomy   . Renal cell carcinoma 01/2010    s/p right radical nephrectomy 01/2010,  [facility]  . Murmur     no  prior work-up  . GERD (gastroesophageal reflux disease)   . Emphysema 2010    dx'd/CXR  . Shortness of breath 09/26/11    "lately all the time"  . Pneumonia 1980's    "walking"    Medications Prior to Admission  Medication Sig Dispense Refill  . albuterol (PROVENTIL) (2.5 MG/3ML) 0.083% nebulizer solution Take 3 mLs (2.5 mg total) by nebulization every 6 (six) hours as needed for wheezing or shortness of breath. 150 mL 1  . budesonide-formoterol (SYMBICORT) 160-4.5 MCG/ACT inhaler Inhale 2 puffs into the lungs 2 (two) times daily. 1 Inhaler 3  . Ibuprofen-Diphenhydramine HCl (ADVIL PM) 200-25 MG CAPS Take 1 tablet by mouth at bedtime as needed (Sleep.).    Marland Kitchen losartan-hydrochlorothiazide (HYZAAR) 100-12.5 MG per tablet Take 1 tablet by mouth daily. 30 tablet 2  . buPROPion (WELLBUTRIN) 75 MG tablet Take 1 tablet (75 mg total) by mouth 2 (two) times daily. (Patient not taking: Reported on 04/17/2016) 60 tablet 1  . zolpidem (AMBIEN) 5 MG tablet Take 1 tablet (5 mg total) by mouth at bedtime as needed for sleep. (Patient not taking: Reported on 04/17/2016) 30 tablet 0     . [  START ON 04/18/2016] aspirin  81 mg Oral Daily  . buPROPion  75 mg Oral BID  . carvedilol  6.25 mg Oral BID WC  . mometasone-formoterol  2 puff Inhalation BID    Infusions:    Allergies  Allergen Reactions  . Ace Inhibitors     Pseudoasthma/ renal failure    Social History   Social History  . Marital Status: Married    Spouse Name: N/A  . Number of Children: 3  . Years of Education: 12   Occupational History  . Multimedia programmer business   Social History Main Topics  . Smoking status: Current Every Day Smoker -- 1.00  packs/day for 53 years    Types: Cigarettes  . Smokeless tobacco: Never Used  . Alcohol Use: 0.0 oz/week    0 Standard drinks or equivalent per week     Comment:  "drink very rarely"  . Drug Use: No     Comment: hx of cocaine last in 2006  . Sexual Activity: No   Other Topics Concern  . Not on file   Social History Narrative   Lives in [place] with her husband.    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    Patient has 3 grown children that do not live with her.    Patient does not have any medical insurance.    Family History  Problem Relation Age of Onset  . COPD Mother     DECEASED/SMOKED    PHYSICAL EXAM: Filed Vitals:   04/17/16 1655 04/17/16 1739  BP: 163/106 151/87  Pulse: 80 85  Temp:    Resp: 17 17    No intake or output data in the 24 hours ending 04/17/16 1753  General:  Well appearing. No respiratory difficulty HEENT: normal Neck: supple. no JVD. Carotids 2+ bilat; no bruits. No lymphadenopathy or thryomegaly appreciated. Cor: PMI nondisplaced. Regular rate & rhythm. No rubs, gallops.  III/VI systolic murmur at the LUSB. Lungs: clear Abdomen: soft, nontender, nondistended. No hepatosplenomegaly. No bruits or masses. Good bowel sounds. Extremities: no cyanosis, clubbing, rash, edema Neuro: alert & oriented x 3, cranial nerves grossly intact. moves all 4 extremities w/o difficulty. Affect pleasant.  Results for orders placed or performed during the hospital encounter of 04/17/16 (from the past 24 hour(s))  Basic metabolic panel     Status: Abnormal   Collection Time: 04/17/16  2:37 PM  Result Value Ref Range   Sodium 139 135 - 145 mmol/L   Potassium 4.2 3.5 - 5.1 mmol/L   Chloride 108 101 - 111 mmol/L   CO2 24 22 - 32 mmol/L   Glucose, Bld 88 65 - 99 mg/dL   BUN 15 6 - 20 mg/dL   Creatinine, Ser 1.28 (H) 0.44 - 1.00 mg/dL   Calcium 9.6 8.9 - 10.3 mg/dL   GFR calc non Af Amer 42 (L) >60 mL/min   GFR calc Af Amer  49 (L) >60 mL/min   Anion gap 7 5 - 15  CBC with Differential     Status: None   Collection Time: 04/17/16  2:37 PM  Result Value Ref Range   WBC 6.3 4.0 - 10.5 K/uL   RBC 4.46 3.87 - 5.11 MIL/uL   Hemoglobin 13.1 12.0 - 15.0 g/dL   HCT 39.4 36.0 - 46.0 %   MCV 88.3 78.0 - 100.0 fL   MCH 29.4 26.0 - 34.0 pg   MCHC 33.2 30.0 -  36.0 g/dL   RDW 13.2 11.5 - 15.5 %   Platelets 304 150 - 400 K/uL   Neutrophils Relative % 54 %   Neutro Abs 3.4 1.7 - 7.7 K/uL   Lymphocytes Relative 36 %   Lymphs Abs 2.2 0.7 - 4.0 K/uL   Monocytes Relative 5 %   Monocytes Absolute 0.3 0.1 - 1.0 K/uL   Eosinophils Relative 4 %   Eosinophils Absolute 0.2 0.0 - 0.7 K/uL   Basophils Relative 1 %   Basophils Absolute 0.0 0.0 - 0.1 K/uL  I-stat troponin, ED     Status: None   Collection Time: 04/17/16  2:55 PM  Result Value Ref Range   Troponin i, poc 0.00 0.00 - 0.08 ng/mL   Comment 3           Dg Chest Port 1 View  04/17/2016  CLINICAL DATA:  Shortness of breath and central chest pain, initial encounter EXAM: PORTABLE CHEST 1 VIEW COMPARISON:  10/26/2015 FINDINGS: The heart size and mediastinal contours are within normal limits. Both lungs are clear. The visualized skeletal structures are unremarkable. IMPRESSION: No active disease. Electronically Signed   By: Inez Catalina M.D.   On: 04/17/2016 15:02    ECG: Sinus rhythm rate 89 bpm.  Repolarization abnormality.    ASSESSMENT/PLAN:  # Chest pain: Symptoms are atypical and likely related to hypertensive urgency.  We will cycle cardiac enzymes and start heparin if positive.  Agree with starting aspirin and adding carvedilol.  Check fasting lipids. Given her HCM and hypertension, she may be having microvascular ischemia.  We will obtain a [employer].   # Hypertensive urgency: BP was very elevated on admission in the setting of not taking her BP medication.  Adding carvedilol is a good idea, as it sounds like she still has outflow tract  obstruction on echo.  This will help reduce myocardial contractility.  I suspect that with actually taking her prescribed antihypertensives and carvedilol, her BP will be too low.  We will reduce the losartan to 50mg  and continue HCTZ, as she reported edema.   # HOCM: # Mitral regurgitation:  Katie [name] has a murmur on exam that is consistent with outflow obstruction. Start cavedilol as above.  We will also obtain an echo.  # Tobacco abuse: Discussed smoking cessation for 5 minutes.  We will start a nicotine patch.  Signed: Azula Zappia C. Oval Linsey, MD, [facility]  04/17/2016, 5:53 PM

## 2016-04-17 NOTE — ED Notes (Signed)
Pt presents with c/o chest pain that is central in location and feels like an ache in nature. Pt reports that the pain initially started last night. Pt denies any shortness of breath at this time. Hx of HTN, smoker.

## 2016-04-17 NOTE — H&P (Signed)
TRH H&P   Patient Demographics:    Katie [name], is a 70 y.o. female  MRN: [id number]   DOB - Dec 28, 1945  Admit Date - 04/17/2016  Outpatient Primary MD for the patient is [name], PA-C  Patient coming from: Home  Chief Complaint  Patient presents with  . Chest Pain      HPI:    Katie [name]  is a 70 y.o. female,With history of essential hypertension, dyslipidemia, on going one pack-a-day smoker, history of renal cell cancer status post right-sided nephrectomy in 2011 followed [facility], CK D stage II with creatinine close to 1.3 at baseline, COPD who was in a good state of health until this afternoon when she was sitting on a couch and playing a video game on her tablet when she started experiencing sudden onset midsternal chest pain radiating to both size of her chest, no other associated symptoms, no aggravating factors, pain relieved by sublingual nitroglycerin. Denies any fever or chills, does have a chronic cough, no phlegm, no abdominal pain or recent heartburn, no other symptoms. Came to the ER  In the ER workup showed poorly controlled blood pressure, labs were unremarkable, ED physician discussed her case with cardiologist on call who recommended hospitalist admission and cardiology to consult. Patient currently after receiving 3 sublingual nitroglycerin and Nitropaste is symptom-free.    Review of systems:    In addition to the HPI above,  Currently symptom free No Fever-chills, No Headache, No changes with Vision or hearing, No problems swallowing food or Liquids, No Chest pain, Cough or Shortness of Breath, No Abdominal pain, No Nausea or Vommitting, Bowel movements are  regular, No Blood in stool or Urine, No dysuria, No new skin rashes or bruises, No new joints pains-aches,  No new weakness, tingling, numbness in any extremity, No recent weight gain or loss, No polyuria, polydypsia or polyphagia, No significant Mental Stressors.  A full 10 point Review of Systems was done, except as stated above, all other Review of Systems were negative.   With Past History of the following :    Past Medical History  Diagnosis Date  . Arthritis   . COPD (chronic obstructive pulmonary disease) (HCC)     no history of PFTs, only diagnosed by CXR, not on any inhalers because she has not had a  PCP in over 2 years  . Hypertension     previously on Lisinopril/HCTZ  . History of heartburn   . Hypercholesteremia     previously on pravastatin   . Cholelithiasis 2011    s/p cholescystectomy   . Renal cell carcinoma 01/2010    s/p right radical nephrectomy 01/2010,  [facility]  . Murmur     no  prior work-up  . GERD (gastroesophageal reflux disease)   . Emphysema 2010    dx'd/CXR  . Shortness of breath 09/26/11    "lately all the time"  . Pneumonia 1980's    "walking"      Past Surgical History  Procedure Laterality Date  . Cholecystectomy  01/2010  . Nephrectomy radical  01/2010    right   . Tubal ligation  1970's  . Diagnostic laparoscopy    . Ovarian cyst removal  1970's    "went through belly button"  . Appendectomy  1970's  . Tee without cardioversion  10/01/2011    Procedure: TRANSESOPHAGEAL ECHOCARDIOGRAM (TEE);  Surgeon: [name];  Location: [place];  Service: Cardiovascular;  Laterality: N/A;  . US echocardiography  09/2011  . Cardiac catheterization  09/2011  . Multiple extractions with alveoloplasty  10/06/2011    Procedure: MULTIPLE EXTRACION WITH ALVEOLOPLASTY;  Surgeon: Lenn Cal, DDS;  Location: [place];  Service: Oral Surgery;  Laterality: N/A;  Multiple extraction of tooth #'s 1, 6, 8, 10, 11, 22,  23, 26, 27, 28, 29 with alveoloplasty and Upper right buccal exostoses reductions.  . Left and right heart catheterization with coronary angiogram N/A 09/30/2011    Procedure: LEFT AND RIGHT HEART CATHETERIZATION WITH CORONARY ANGIOGRAM;  Surgeon: Candee Furbish, MD;  Location: [facility] CATH LAB;  Service: Cardiovascular;  Laterality: N/A;      Social History:     Social History  Substance Use Topics  . Smoking status: Current Every Day Smoker -- 1.00 packs/day for 53 years    Types: Cigarettes  . Smokeless tobacco: Never Used  . Alcohol Use: 0.0 oz/week    0 Standard drinks or equivalent per week     Comment:  "drink very rarely"     Lives - At home and fairly active      Family History :     Family History  Problem Relation Age of Onset  . COPD Mother     DECEASED/SMOKED       Home Medications:   Prior to Admission medications   Medication Sig Start Date End Date Taking? Authorizing Provider  albuterol (PROVENTIL) (2.5 MG/3ML) 0.083% nebulizer solution Take 3 mLs (2.5 mg total) by nebulization every 6 (six) hours as needed for wheezing or shortness of breath. 06/26/15  Yes Brunetta Jeans, PA-C  budesonide-formoterol (SYMBICORT) 160-4.5 MCG/ACT inhaler Inhale 2 puffs into the lungs 2 (two) times daily. 06/26/15  Yes Brunetta Jeans, PA-C  Ibuprofen-Diphenhydramine HCl (ADVIL PM) 200-25 MG CAPS Take 1 tablet by mouth at bedtime as needed (Sleep.).   Yes Historical Provider, MD  losartan-hydrochlorothiazide (HYZAAR) 100-12.5 MG per tablet Take 1 tablet by mouth daily. 06/26/15  Yes Brunetta Jeans, PA-C  buPROPion (WELLBUTRIN) 75 MG tablet Take 1 tablet (75 mg total) by mouth 2 (two) times daily. Patient not taking: Reported on 04/17/2016 08/03/15   Brunetta Jeans, PA-C  zolpidem (AMBIEN) 5 MG tablet Take 1 tablet (5 mg total) by mouth at bedtime as needed for sleep. Patient not taking: Reported on 04/17/2016 08/03/15  Brunetta Jeans, PA-C     Allergies:     Allergies    Allergen Reactions  . Ace Inhibitors     Pseudoasthma/ renal failure     Physical Exam:   Vitals  Blood pressure 142/91, pulse 87, temperature 98.2 F (36.8 C), temperature source Oral, resp. rate 21, SpO2 95 %.   1. General Middle-aged pleasant Hispanic female lying in bed in NAD,     2. Normal affect and insight, Not Suicidal or Homicidal, Awake Alert, Oriented X 3.  3. No F.N deficits, ALL C.Nerves Intact, Strength 5/5 all 4 extremities, Sensation intact all 4 extremities, Plantars down going.  4. Ears and Eyes appear Normal, Conjunctivae clear, PERRLA. Moist Oral Mucosa.  5. Supple Neck, No JVD, No cervical lymphadenopathy appriciated, No Carotid Bruits.  6. Symmetrical Chest wall movement, Good air movement bilaterally, CTAB.  7. RRR, No Gallops, Rubs or Murmurs, No Parasternal Heave.  8. Positive Bowel Sounds, Abdomen Soft, No tenderness, No organomegaly appriciated,No rebound -guarding or rigidity.  9.  No Cyanosis, Normal Skin Turgor, No Skin Rash or Bruise.  10. Good muscle tone,  joints appear normal , no effusions, Normal ROM.  11. No Palpable Lymph Nodes in Neck or Axillae      Data Review:    CBC  Recent Labs Lab 04/17/16 1437  WBC 6.3  HGB 13.1  HCT 39.4  PLT 304  MCV 88.3  MCH 29.4  MCHC 33.2  RDW 13.2  LYMPHSABS 2.2  MONOABS 0.3  EOSABS 0.2  BASOSABS 0.0   ------------------------------------------------------------------------------------------------------------------  Chemistries   Recent Labs Lab 04/17/16 1437  NA 139  K 4.2  CL 108  CO2 24  GLUCOSE 88  BUN 15  CREATININE 1.28*  CALCIUM 9.6   ------------------------------------------------------------------------------------------------------------------ CrCl cannot be calculated (Unknown ideal weight.). ------------------------------------------------------------------------------------------------------------------ No results for input(s): TSH, T4TOTAL, T3FREE,  THYROIDAB in the last 72 hours.  Invalid input(s): FREET3  Coagulation profile No results for input(s): INR, PROTIME in the last 168 hours. ------------------------------------------------------------------------------------------------------------------- No results for input(s): DDIMER in the last 72 hours. -------------------------------------------------------------------------------------------------------------------  Cardiac Enzymes No results for input(s): CKMB, TROPONINI, MYOGLOBIN in the last 168 hours.  Invalid input(s): CK ------------------------------------------------------------------------------------------------------------------ No results found for: BNP   ---------------------------------------------------------------------------------------------------------------  Urinalysis    Component Value Date/Time   COLORURINE YELLOW 08/08/2013 0811   APPEARANCEUR CLEAR 08/08/2013 0811   LABSPEC 1.015 08/08/2013 0811   PHURINE 5.0 08/08/2013 0811   GLUCOSEU NEG 08/08/2013 0811   HGBUR NEG 08/08/2013 0811   BILIRUBINUR NEG 08/08/2013 0811   KETONESUR NEG 08/08/2013 0811   PROTEINUR negative 08/03/2015 1253   PROTEINUR NEG 08/08/2013 0811   UROBILINOGEN 0.2 08/08/2013 0811   NITRITE positive 08/03/2015 1253   NITRITE NEG 08/08/2013 0811   LEUKOCYTESUR small (1+)* 08/03/2015 1253    ----------------------------------------------------------------------------------------------------------------   Imaging Results:    Dg Chest Port 1 View  04/17/2016  CLINICAL DATA:  Shortness of breath and central chest pain, initial encounter EXAM: PORTABLE CHEST 1 VIEW COMPARISON:  10/26/2015 FINDINGS: The heart size and mediastinal contours are within normal limits. Both lungs are clear. The visualized skeletal structures are unremarkable. IMPRESSION: No active disease. Electronically Signed   By: Inez Catalina M.D.   On: 04/17/2016 15:02    My personal review of EKG: Rhythm NSR,  Rate  89 /min,   no Acute ST changes   Assessment & Plan:     1. Chest pain in a patient with risk factors for CAD. Will be kept on  telemetry for 23 hr observation, cycle troponins, we'll place her on aspirin along with beta blocker, counseled to quit smoking, cardiology to evaluate later today. Likely stress test in the morning we'll keep her nothing by mouth after midnight except for medications.  2. Poorly controlled blood pressure. At this time I have added Coreg, will continue home dose HCTZ and ARB, hydralazine as needed, not she also has Nitropaste. Monitor blood pressure and adjust medications as needed.  3. History of renal cell cancer with right-sided nephrectomy status post CK D CKD 3. Creatinine at or better than baseline monitor.  4. Smoking. Mentioned history of COPD per last chest x-ray. Stable no acute issues she has been counseled to quit smoking thereafter follow with PCP.  5. Depression. On Wellbutrin continue.    DVT Prophylaxis  Lovenox    AM Labs Ordered, also please review Full Orders  Family Communication: Admission, patients condition and plan of care including tests being ordered have been discussed with the patient and family who indicate understanding and agree with the plan and Code Status.  Code Status Full  Likely DC to  Home in 1 day  Condition GUARDED     Consults called: Cards    Admission status: Observation  Time spent in minutes : 30   Lala Lund K M.D on 04/17/2016 at 4:33 PM  Between 7am to 7pm - Pager - [phone]. After 7pm go to [email or web address] - password [facility]  [facility] - Office  [phone]

## 2016-04-18 ENCOUNTER — Ambulatory Visit (HOSPITAL_COMMUNITY)
Admit: 2016-04-18 | Discharge: 2016-04-18 | Disposition: A | Discharge status: Home / Self Care | Payer: Medicare Other | Attending: Cardiovascular Disease | Admitting: Cardiovascular Disease

## 2016-04-18 ENCOUNTER — Observation Stay (HOSPITAL_BASED_OUTPATIENT_CLINIC_OR_DEPARTMENT_OTHER): Payer: Medicare Other

## 2016-04-18 ENCOUNTER — Observation Stay (HOSPITAL_COMMUNITY)
Admit: 2016-04-18 | Discharge: 2016-04-18 | Disposition: A | Discharge status: Home / Self Care | Payer: Medicare Other | Attending: Cardiovascular Disease | Admitting: Cardiovascular Disease

## 2016-04-18 DIAGNOSIS — I10 Essential (primary) hypertension: Secondary | ICD-10-CM | POA: Diagnosis not present

## 2016-04-18 DIAGNOSIS — J449 Chronic obstructive pulmonary disease, unspecified: Secondary | ICD-10-CM | POA: Diagnosis not present

## 2016-04-18 DIAGNOSIS — I340 Nonrheumatic mitral (valve) insufficiency: Secondary | ICD-10-CM | POA: Diagnosis not present

## 2016-04-18 DIAGNOSIS — R079 Chest pain, unspecified: Secondary | ICD-10-CM | POA: Diagnosis not present

## 2016-04-18 LAB — ECHOCARDIOGRAM COMPLETE
AVLVOTPG: 8 mmHg
CHL CUP DOP CALC LVOT VTI: 32.5 cm
CHL CUP MV DEC (S): 204
EERAT: 12.51
EWDT: 204 ms
FS: 36 % (ref 28–44)
Height: 61 in
IV/PV OW: 1.26
LA diam end sys: 36 mm
LA diam index: 2.11 cm/m2
LASIZE: 36 mm
LAVOL: 37.1 mL
LAVOLA4C: 29.4 mL
LAVOLIN: 21.7 mL/m2
LV PW d: 10 mm — AB (ref 0.6–1.1)
LV e' LATERAL: 7.29 cm/s
LVEEAVG: 12.51
LVEEMED: 12.51
LVOT SV: 112 mL
LVOT area: 3.46 cm2
LVOT diameter: 21 mm
LVOT peak vel: 137 cm/s
MVPG: 3 mmHg
MVPKAVEL: 109 m/s
MVPKEVEL: 91.2 m/s
TDI e' lateral: 7.29
TDI e' medial: 6.64
Weight: 2337.6 oz

## 2016-04-18 LAB — LIPID PANEL
CHOL/HDL RATIO: 6 ratio
CHOLESTEROL: 280 mg/dL — AB (ref 0–200)
HDL: 47 mg/dL (ref >40)
LDL Cholesterol: UNDETERMINED mg/dL (ref 0–99)
Triglycerides: 460 mg/dL — ABNORMAL HIGH (ref <150)
VLDL: UNDETERMINED mg/dL (ref 0–40)

## 2016-04-18 LAB — TROPONIN I

## 2016-04-18 MED ORDER — FENOFIBRATE 160 MG PO TABS: 160.0000 mg | ORAL_TABLET | Freq: Every day | ORAL | Status: DC

## 2016-04-18 MED ORDER — METOPROLOL TARTRATE 25 MG PO TABS: 25.0000 mg | ORAL_TABLET | Freq: Two times a day (BID) | ORAL | Status: DC

## 2016-04-18 MED ORDER — TECHNETIUM TC 99M TETROFOSMIN IV KIT
30.0000 | PACK | Freq: Once | INTRAVENOUS | Status: AC | PRN
  Administered 2016-04-18: 30 via INTRAVENOUS

## 2016-04-18 MED ORDER — ATORVASTATIN CALCIUM 20 MG PO TABS: 20.0000 mg | ORAL_TABLET | Freq: Every day | ORAL | Status: DC

## 2016-04-18 MED ORDER — REGADENOSON 0.4 MG/5ML IV SOLN
INTRAVENOUS | Status: AC
  Filled 2016-04-18: qty 5

## 2016-04-18 MED ORDER — NITROGLYCERIN 0.4 MG SL SUBL: 0.4000 mg | SUBLINGUAL_TABLET | SUBLINGUAL | Status: DC | PRN

## 2016-04-18 MED ORDER — REGADENOSON 0.4 MG/5ML IV SOLN
0.4000 mg | Freq: Once | INTRAVENOUS | Status: AC
  Administered 2016-04-18: 0.4 mg via INTRAVENOUS

## 2016-04-18 MED ORDER — TECHNETIUM TC 99M TETROFOSMIN IV KIT
10.0000 | PACK | Freq: Once | INTRAVENOUS | Status: AC | PRN
  Administered 2016-04-18: 10 via INTRAVENOUS

## 2016-04-18 MED ORDER — ATORVASTATIN CALCIUM 10 MG PO TABS
20.0000 mg | ORAL_TABLET | Freq: Every day | ORAL | Status: DC
  Administered 2016-04-18: 20 mg via ORAL
  Filled 2016-04-18: qty 2

## 2016-04-18 MED ORDER — FENOFIBRATE 160 MG PO TABS
160.0000 mg | ORAL_TABLET | Freq: Every day | ORAL | Status: DC
  Administered 2016-04-18 – 2016-04-19 (×2): 160 mg via ORAL
  Filled 2016-04-18 (×2): qty 1

## 2016-04-18 MED ORDER — ASPIRIN 81 MG PO CHEW: 81.0000 mg | CHEWABLE_TABLET | Freq: Every day | ORAL | Status: DC

## 2016-04-18 MED ORDER — METOPROLOL TARTRATE 25 MG PO TABS
25.0000 mg | ORAL_TABLET | Freq: Two times a day (BID) | ORAL | Status: DC
Start: 2016-04-18 — End: 2016-04-19
  Administered 2016-04-18 – 2016-04-19 (×3): 25 mg via ORAL
  Filled 2016-04-18 (×3): qty 1

## 2016-04-18 NOTE — Care Management Obs Status (Signed)
[facility] NOTIFICATION   Patient Details  Name: Corlette Denoncourt MRN: [id number] Date of Birth: 1946-05-22   Medicare Observation Status Notification Given:  Yes    Purcell Mouton, RN 04/18/2016, 2:42 PM

## 2016-04-18 NOTE — Discharge Instructions (Signed)
Follow with Primary MD [name], PA-C in 7 days   Get CBC, CMP, 2 view Chest X ray checked  by Primary MD or SNF MD in 5-7 days ( we routinely change or add medications that can affect your baseline labs and fluid status, therefore we recommend that you get the mentioned basic workup next visit with your PCP, your PCP may decide not to get them or add new tests based on their clinical decision)   Activity: As tolerated with Full fall precautions use walker/cane & assistance as needed   Disposition Home     Diet:   Heart Healthy Low Carb.  For Heart failure patients - Check your Weight same time everyday, if you gain over 2 pounds, or you develop in leg swelling, experience more shortness of breath or chest pain, call your Primary MD immediately. Follow Cardiac Low Salt Diet and 1.5 lit/day fluid restriction.   On your next visit with your primary care physician please Get Medicines reviewed and adjusted.   Please request your Prim.MD to go over all Hospital Tests and Procedure/Radiological results at the follow up, please get all Hospital records sent to your Prim MD by signing hospital release before you go home.   If you experience worsening of your admission symptoms, develop shortness of breath, life threatening emergency, suicidal or homicidal thoughts you must seek medical attention immediately by calling 911 or calling your MD immediately  if symptoms less severe.  You Must read complete instructions/literature along with all the possible adverse reactions/side effects for all the Medicines you take and that have been prescribed to you. Take any new Medicines after you have completely understood and accpet all the possible adverse reactions/side effects.   Do not drive, operate heavy machinery, perform activities at heights, swimming or participation in water activities or provide baby sitting services if your were admitted for syncope or siezures until you have seen by  Primary MD or a Neurologist and advised to do so again.  Do not drive when taking Pain medications.    Do not take more than prescribed Pain, Sleep and Anxiety Medications  Special Instructions: If you have smoked or chewed Tobacco  in the last 2 yrs please stop smoking, stop any regular Alcohol  and or any Recreational drug use.  Wear Seat belts while driving.   Please note  You were cared for by a hospitalist during your hospital stay. If you have any questions about your discharge medications or the care you received while you were in the hospital after you are discharged, you can call the unit and asked to speak with the hospitalist on call if the hospitalist that took care of you is not available. Once you are discharged, your primary care physician will handle any further medical issues. Please note that NO REFILLS for any discharge medications will be authorized once you are discharged, as it is imperative that you return to your primary care physician (or establish a relationship with a primary care physician if you do not have one) for your aftercare needs so that they can reassess your need for medications and monitor your lab values.

## 2016-04-18 NOTE — Discharge Summary (Signed)
Katie [name] [id number] DOB: October 18, 1946 DOA: 04/17/2016  PCP: [name], PA-C  Admit date: 04/17/2016  Discharge date: 04/19/2016  Admitted From: Home   Disposition:  Home   Recommendations for Outpatient Follow-up:   Follow up with PCP in 1-2 weeks  PCP Please obtain BMP/CBC in one week, 2 view CXR (see Discharge instructions)   PCP Please follow up on the following pending results: monitor secondary risk factors for CAD   Home Health: None   Equipment/Devices: None  Discharge Condition: Stable    CODE STATUS: Full   Diet Recommendation: Heart Healthy Consultations: Cards   Chief Complaint  Patient presents with  . Chest Pain     Brief history of present illness from the day of admission and additional interim summary     Katie [name] is a 70 y.o. female,With history of essential hypertension, dyslipidemia, on going one pack-a-day smoker, history of renal cell cancer status post right-sided nephrectomy in 2011 followed by [facility], CK D stage II with creatinine close to 1.3 at baseline, COPD who was in a good state of health until this afternoon when she was sitting on a couch and playing a video game on her tablet when she started experiencing sudden onset midsternal chest pain radiating to both size of her chest, no other associated symptoms, no aggravating factors, pain relieved by sublingual nitroglycerin. Denies any fever or chills, does have a chronic cough, no phlegm, no abdominal pain or recent heartburn, no other symptoms. Came to the ER  In the ER workup showed poorly controlled blood pressure, labs were unremarkable, ED physician discussed her case with cardiologist on call who recommended hospitalist admission and cardiology to consult. Patient currently after receiving 3  sublingual nitroglycerin and Nitropaste is symptom-free.  Hospital issues addressed     1. Chest pain in a patient with risk factors for CAD. Ruled out for MI, seen Cards, Stable Echo with compensated Chr Diastolic CHF EF 123456, placed on ASA-B Blocker and PRN NTG, Lexiscan stable, DC home with PCP and Cards follow up, symptom free now. D/W Dr Debara Pickett DC home.  2. Poorly controlled HTN. At this time I have added B blocker, will continue home dose HCTZ and ARB, PCP to monitor blood pressure and adjust medications as needed.  3. History of renal cell cancer with right-sided nephrectomy , now CKD 3 at baseline. Creatinine at or better than baseline monitor.  4. Smoking. Mentioned history of COPD per last chest x-ray. Stable no acute issues she has been counseled to quit smoking thereafter follow with PCP.  5. Depression. On Wellbutrin continue.   6. Dyslipidemia - placed on Lipitor and Tricor, TGs > 400  Lab Results  Component Value Date   CHOL 280* 04/18/2016   HDL 47 04/18/2016   LDLCALC UNABLE TO CALCULATE IF TRIGLYCERIDE OVER 400 mg/dL 04/18/2016   TRIG 460* 04/18/2016   CHOLHDL 6.0 04/18/2016       Discharge diagnosis     Active Problems:   Essential hypertension   HLD (  hyperlipidemia)   Renal cell carcinoma (HCC)   S/P cholecystectomy   Mitral regurgitation due to cusp prolapse   Tobacco abuse disorder   UTI symptoms   Chest pain at rest   Chest pain    Discharge instructions        Discharge Instructions    Diet - low sodium heart healthy    Complete by:  As directed      Discharge instructions    Complete by:  As directed   Follow with Primary MD [name], PA-C in 7 days   Get CBC, CMP, 2 view Chest X ray checked  by Primary MD or SNF MD in 5-7 days ( we routinely change or add medications that can affect your baseline labs and fluid status, therefore we recommend that you get the mentioned basic workup next visit with your PCP, your PCP may decide  not to get them or add new tests based on their clinical decision)   Activity: As tolerated with Full fall precautions use walker/cane & assistance as needed   Disposition Home     Diet:   Heart Healthy .  For Heart failure patients - Check your Weight same time everyday, if you gain over 2 pounds, or you develop in leg swelling, experience more shortness of breath or chest pain, call your Primary MD immediately. Follow Cardiac Low Salt Diet and 1.5 lit/day fluid restriction.   On your next visit with your primary care physician please Get Medicines reviewed and adjusted.   Please request your Prim.MD to go over all Hospital Tests and Procedure/Radiological results at the follow up, please get all Hospital records sent to your Prim MD by signing hospital release before you go home.   If you experience worsening of your admission symptoms, develop shortness of breath, life threatening emergency, suicidal or homicidal thoughts you must seek medical attention immediately by calling 911 or calling your MD immediately  if symptoms less severe.  You Must read complete instructions/literature along with all the possible adverse reactions/side effects for all the Medicines you take and that have been prescribed to you. Take any new Medicines after you have completely understood and accpet all the possible adverse reactions/side effects.   Do not drive, operate heavy machinery, perform activities at heights, swimming or participation in water activities or provide baby sitting services if your were admitted for syncope or siezures until you have seen by Primary MD or a Neurologist and advised to do so again.  Do not drive when taking Pain medications.    Do not take more than prescribed Pain, Sleep and Anxiety Medications  Special Instructions: If you have smoked or chewed Tobacco  in the last 2 yrs please stop smoking, stop any regular Alcohol  and or any Recreational drug use.  Wear Seat  belts while driving.   Please note  You were cared for by a hospitalist during your hospital stay. If you have any questions about your discharge medications or the care you received while you were in the hospital after you are discharged, you can call the unit and asked to speak with the hospitalist on call if the hospitalist that took care of you is not available. Once you are discharged, your primary care physician will handle any further medical issues. Please note that NO REFILLS for any discharge medications will be authorized once you are discharged, as it is imperative that you return to your primary care physician (or establish a relationship with a  primary care physician if you do not have one) for your aftercare needs so that they can reassess your need for medications and monitor your lab values.     Discharge patient    Complete by:  As directed      Increase activity slowly    Complete by:  As directed            Discharge Medications     Medication List    TAKE these medications        ADVIL PM 200-25 MG Caps  Generic drug:  Ibuprofen-Diphenhydramine HCl  Take 1 tablet by mouth at bedtime as needed (Sleep.).     albuterol (2.5 MG/3ML) 0.083% nebulizer solution  Commonly known as:  PROVENTIL  Take 3 mLs (2.5 mg total) by nebulization every 6 (six) hours as needed for wheezing or shortness of breath.     aspirin 81 MG chewable tablet  Chew 1 tablet (81 mg total) by mouth daily.     atorvastatin 20 MG tablet  Commonly known as:  LIPITOR  Take 1 tablet (20 mg total) by mouth daily at 6 PM.     budesonide-formoterol 160-4.5 MCG/ACT inhaler  Commonly known as:  SYMBICORT  Inhale 2 puffs into the lungs 2 (two) times daily.     buPROPion 75 MG tablet  Commonly known as:  WELLBUTRIN  Take 1 tablet (75 mg total) by mouth 2 (two) times daily.     fenofibrate 160 MG tablet  Take 1 tablet (160 mg total) by mouth daily.     losartan-hydrochlorothiazide 100-12.5 MG  tablet  Commonly known as:  HYZAAR  Take 1 tablet by mouth daily.     metoprolol tartrate 25 MG tablet  Commonly known as:  LOPRESSOR  Take 1 tablet (25 mg total) by mouth 2 (two) times daily.     nitroGLYCERIN 0.4 MG SL tablet  Commonly known as:  NITROSTAT  Place 1 tablet (0.4 mg total) under the tongue every 5 (five) minutes as needed for chest pain.     zolpidem 5 MG tablet  Commonly known as:  AMBIEN  Take 1 tablet (5 mg total) by mouth at bedtime as needed for sleep.        Allergies  Allergen Reactions  . Ace Inhibitors     Pseudoasthma/ renal failure    Follow-up Information    Follow up with [name], PA-C. Schedule an appointment as soon as possible for a visit in 1 week.   Specialty:  Family Medicine   Contact information:   [address] [place] [address] [phone]       Follow up with Skeet Latch, MD. Schedule an appointment as soon as possible for a visit in 1 week.   Specialty:  Cardiology   Contact information:   [address] [place] [address] [phone]        Major procedures and Radiology Reports - PLEASE review detailed and final reports for all details, in brief -   TTE - Left ventricle: The cavity size was normal. Wall thickness was increased in a pattern of mild LVH. There was focal basal hypertrophy. Systolic function was normal. The estimated ejection fraction was in the range of 55% to 60%. Wall motion was normal; there were no regional wall motion abnormalities. Doppler parameters are consistent with abnormal left ventricular relaxation (grade 1 diastolic dysfunction).  Nm Myocar Multi W/spect W/wall Motion / Ef  04/19/2016  CLINICAL DATA:  Chest pain, shortness of breath. Hypertension, previous tobacco abuse. EXAM: MYOCARDIAL IMAGING WITH SPECT (REST AND PHARMACOLOGIC-STRESS) GATED LEFT VENTRICULAR WALL MOTION STUDY LEFT VENTRICULAR EJECTION FRACTION TECHNIQUE: Standard myocardial  SPECT imaging was performed after resting intravenous injection of 10 mCi Tc-66m sestamibi. Subsequently, intravenous infusion of Lexiscan was performed under the supervision of the Cardiology staff. At peak effect of the drug, 30 mCi Tc-17m sestamibi was injected intravenously and standard myocardial SPECT imaging was performed. Quantitative gated imaging was also performed to evaluate left ventricular wall motion, and estimate left ventricular ejection fraction. COMPARISON:  None. FINDINGS: Perfusion: There is a small area of moderately decreased activity involving the inferoseptal region of the left ventricular myocardium near the base on both resting and stress images. No suggestion of ischemia. Wall Motion: Mildly decreased inferoapical motion and wall thickening. A low otherwise normal. Left Ventricular Ejection Fraction: 69 % End diastolic volume 73 ml End systolic volume 22 ml IMPRESSION: 1.   No ischemia. 2. Inferoseptal scar. 3. Left ventricular ejection fraction 69% 4. Low-risk stress test findings*. *2012 Appropriate Use Criteria for Coronary Revascularization Focused Update: J Am Coll Cardiol. N6492421. http://content.airportbarriers.com.aspx?articleid=[email or web address] Electronically Signed   By: Lucrezia Europe M.D.   On: 04/19/2016 09:10   Dg Chest Port 1 View  04/17/2016  CLINICAL DATA:  Shortness of breath and central chest pain, initial encounter EXAM: PORTABLE CHEST 1 VIEW COMPARISON:  10/26/2015 FINDINGS: The heart size and mediastinal contours are within normal limits. Both lungs are clear. The visualized skeletal structures are unremarkable. IMPRESSION: No active disease. Electronically Signed   By: Inez Catalina M.D.   On: 04/17/2016 15:02    Micro Results      No results found for this or any previous visit (from the past 240 hour(s)).  Today   Subjective    Lakethia Childrey today has no headache,no chest abdominal pain,no new weakness tingling or numbness, feels much better  wants to go home today.     Objective   Blood pressure 137/94, pulse 71, temperature 98.8 F (37.1 C), temperature source Oral, resp. rate 18, height 5\' 1"  (1.549 m), weight 66.271 kg (146 lb 1.6 oz), SpO2 100 %.   Intake/Output Summary (Last 24 hours) at 04/19/16 0916 Last data filed at 04/18/16 2104  Gross per 24 hour  Intake    480 ml  Output      0 ml  Net    480 ml    Exam Awake Alert, Oriented x 3, No new F.N deficits, Normal affect [place].AT,PERRAL Supple Neck,No JVD, No cervical lymphadenopathy appriciated.  Symmetrical Chest wall movement, Good air movement bilaterally, CTAB RRR,No Gallops,Rubs or new Murmurs, No Parasternal Heave +ve B.Sounds, Abd Soft, Non tender, No organomegaly appriciated, No rebound -guarding or rigidity. No Cyanosis, Clubbing or edema, No new Rash or bruise   Data Review   CBC w Diff:  Lab Results  Component Value Date   WBC 6.3 04/17/2016   HGB 13.1 04/17/2016   HCT 39.4 04/17/2016   PLT 304 04/17/2016   LYMPHOPCT 36 04/17/2016   MONOPCT 5 04/17/2016   EOSPCT 4 04/17/2016   BASOPCT 1 04/17/2016    CMP:  Lab Results  Component Value Date   NA 139 04/17/2016   K 4.2 04/17/2016   CL 108 04/17/2016   CO2 24 04/17/2016   BUN 15 04/17/2016   CREATININE 1.28* 04/17/2016   CREATININE 1.42* 08/08/2013   PROT 6.8 01/17/2014   ALBUMIN 3.2* 01/17/2014   BILITOT <0.2* 01/17/2014  ALKPHOS 83 01/17/2014   AST 17 01/17/2014   ALT 11 01/17/2014  .   Total Time in preparing paper work, data evaluation and todays exam - 35 minutes  Thurnell Lose M.D on 04/19/2016 at 9:16 AM  [facility]   Office  [phone]

## 2016-04-18 NOTE — Progress Notes (Signed)
Patient Name: Katie [name] Date of Encounter: 04/18/2016  Hospital Problem List     Active Problems:   Essential hypertension   HLD (hyperlipidemia)   Renal cell carcinoma (HCC)   S/P cholecystectomy   Mitral regurgitation due to cusp prolapse   Tobacco abuse disorder   UTI symptoms   Chest pain at rest   Chest pain    Subjective   Doing well this morning. Did have an episode of chest pain during the night, but subsided quickly. No dyspnea.  Inpatient Medications    . aspirin  81 mg Oral Daily  . buPROPion  75 mg Oral BID  . carvedilol  6.25 mg Oral BID WC  . enoxaparin (LOVENOX) injection  40 mg Subcutaneous Q24H  . losartan  50 mg Oral Daily   And  . hydrochlorothiazide  12.5 mg Oral Daily  . mometasone-formoterol  2 puff Inhalation BID  . nicotine  21 mg Transdermal Daily    Vital Signs    Filed Vitals:   04/17/16 2032 04/17/16 2038 04/18/16 0210 04/18/16 0520  BP: 121/79 129/73 124/70 117/82  Pulse: 93 90 86 70  Temp:   98.1 F (36.7 C) 98.1 F (36.7 C)  TempSrc:   Oral Oral  Resp: 16 16 16 18   Height:    5\' 1"  (1.549 m)  Weight:    146 lb 1.6 oz (66.271 kg)  SpO2: 100% 100% 99% 98%   No intake or output data in the 24 hours ending 04/18/16 0820 Filed Weights   04/18/16 0520  Weight: 146 lb 1.6 oz (66.271 kg)    Physical Exam    General: Pleasant, NAD. Neuro: Alert and oriented X 3. Moves all extremities spontaneously. Psych: Normal affect. HEENT:  Normal  Neck: Supple without bruits or JVD. Lungs:  Resp regular and unlabored, Expiratory wheezing bilaterally. Heart: RRR no s3, s4, 3/6 systolic murmur. Abdomen: Soft, non-tender, non-distended, BS + x 4.  Extremities: No clubbing, cyanosis or edema. DP/PT/Radials 2+ and equal bilaterally.  Labs    CBC  Recent Labs  04/17/16 1437  WBC 6.3  NEUTROABS 3.4  HGB 13.1  HCT 39.4  MCV 88.3  PLT 123456   Basic Metabolic Panel  Recent Labs  04/17/16 1437  NA 139  K 4.2  CL 108  CO2  24  GLUCOSE 88  BUN 15  CREATININE 1.28*  CALCIUM 9.6   Cardiac Enzymes  Recent Labs  04/17/16 1829 04/17/16 2157 04/18/16 0002  TROPONINI <0.03 <0.03 <0.03    Telemetry    SR  ECG    SR with LVH  Radiology      Assessment & Plan    Katie [name] is a 89F with hypertension, hyperlipidemia, HCM with SAM and severe MR, RCC, CKD 3, COPD and ongoing tobacco abuse here with chest pain and hypertensive urgency.  1. Chest pain: She does report a brief episode of chest pain while resting last night. Symptoms are atypical and likely related to hypertensive urgency. --Trops cycled and neg x3. Lipid panel pending.  -- Currently on aspirin and carvedilol. -- She is going for lexiscan myoview this morning, will follow up on results.  2. Hypertensive urgency: BP was very elevated on admission in the setting of not taking her BP medication. Blood pressure is better controlled this morning.  -- Would continue with carvedilol, losartan, and HCTZ  3. HOCM:  4. Mitral regurgitation: Previous echo reports moderate to severe MR, EF 60-65% --Repeat Echo pending  5. Tobacco  abuse: Currently with nicotine patch, reinforced importance of cessation.  6. COPD: Noted to have expiratory wheezing this morning, received treatment via [facility] while being transported to [facility] for [employer].   Signed, Reino Bellis NP-C Pager [phone]  Patient seen and examined. Agree with assessment and plan. BP is currently controlled.  She is undergoing [place] study for ischemic evaluation of her chest discomfort.  The patient has a history of wheezing and apparently received a breathing treatment via [facility] while being transported to [facility] for [place] study.  She has been on carvedilol.  I will discontinue this and its place initiate more cardioselective metoprolol tartrate, initially at 25 g twice a day.  If wheezing persists, consider switching to bisoprolol or nebivolol or  discontinue beta blocker altogether and change to cardizem or verapamil for blood pressure control and chronotropic benefit.   Troy Sine, MD, [facility] 04/18/2016 10:45 AM

## 2016-04-18 NOTE — Progress Notes (Signed)
  Echocardiogram 2D Echocardiogram has been performed.  Tresa Res 04/18/2016, 2:22 PM

## 2016-04-19 DIAGNOSIS — I340 Nonrheumatic mitral (valve) insufficiency: Secondary | ICD-10-CM | POA: Diagnosis not present

## 2016-04-19 DIAGNOSIS — I10 Essential (primary) hypertension: Secondary | ICD-10-CM | POA: Diagnosis not present

## 2016-04-19 DIAGNOSIS — R079 Chest pain, unspecified: Secondary | ICD-10-CM | POA: Diagnosis not present

## 2016-04-19 LAB — NM MYOCAR MULTI W/SPECT W/WALL MOTION / EF
CHL CUP RESTING HR STRESS: 70 {beats}/min
CSEPPHR: 110 {beats}/min
Exercise duration (min): 4 min

## 2016-04-19 NOTE — Progress Notes (Signed)
Patient Name: Katie [name] Date of Encounter: 04/19/2016  Hospital Problem List     Active Problems:   Essential hypertension   HLD (hyperlipidemia)   Renal cell carcinoma (HCC)   S/P cholecystectomy   Mitral regurgitation due to cusp prolapse   Tobacco abuse disorder   UTI symptoms   Chest pain at rest   Chest pain    Subjective   Stress test performed yesterday, but not yet interpreted. Repeat echo yesterday shows LVEF 55-60%, changes consistent with HCM but no LVOT obstruction, grade 1 DD, no MR was reported. Although there is no mention on the echo about mitral valve disease, there is severe posterior leaflet prolapse which seems to be associated with only trivial MR at this point by color doppler (was reported severe by TEE in 2012 at which time CT surgery evaluated for possible valve repair, but this was not performed).   Inpatient Medications    . aspirin  81 mg Oral Daily  . atorvastatin  20 mg Oral q1800  . buPROPion  75 mg Oral BID  . enoxaparin (LOVENOX) injection  40 mg Subcutaneous Q24H  . fenofibrate  160 mg Oral Daily  . losartan  50 mg Oral Daily   And  . hydrochlorothiazide  12.5 mg Oral Daily  . metoprolol tartrate  25 mg Oral BID  . mometasone-formoterol  2 puff Inhalation BID  . nicotine  21 mg Transdermal Daily    Vital Signs    Filed Vitals:   04/18/16 1620 04/18/16 2027 04/18/16 2050 04/19/16 0530  BP: 144/87  151/93 137/94  Pulse: 71 72 69 71  Temp: 98.5 F (36.9 C)  98.5 F (36.9 C) 98.8 F (37.1 C)  TempSrc: Oral  Oral Oral  Resp: 20 18 20 18   Height:      Weight:      SpO2: 100% 99% 100% 100%    Intake/Output Summary (Last 24 hours) at 04/19/16 0857 Last data filed at 04/18/16 2104  Gross per 24 hour  Intake    480 ml  Output      0 ml  Net    480 ml   Filed Weights   04/18/16 0520  Weight: 146 lb 1.6 oz (66.271 kg)    Physical Exam    Deferred  Labs    CBC  Recent Labs  04/17/16 1437  WBC 6.3  NEUTROABS 3.4   HGB 13.1  HCT 39.4  MCV 88.3  PLT 123456   Basic Metabolic Panel  Recent Labs  04/17/16 1437  NA 139  K 4.2  CL 108  CO2 24  GLUCOSE 88  BUN 15  CREATININE 1.28*  CALCIUM 9.6   Cardiac Enzymes  Recent Labs  04/17/16 1829 04/17/16 2157 04/18/16 0002  TROPONINI <0.03 <0.03 <0.03    Telemetry    SR  ECG    SR with LVH  Radiology    lexiscan myoview - personally reviewed the images, report is pending but appears non-ischemic. Small fixed defect is from HCM.   Assessment & Plan    Ms. Unruh is a 70F with hypertension, hyperlipidemia, HCM with SAM and severe MR, RCC, CKD 3, COPD and ongoing tobacco abuse here with chest pain and hypertensive urgency.  1. Chest pain: She does report a brief episode of chest pain while resting last night. Symptoms are atypical and likely related to hypertensive urgency. --Myoview personally reviewed and appears non-ischemic. Small fixed defect correlates with HCM at the basal septum.  2. Hypertensive  urgency: BP was very elevated on admission in the setting of not taking her BP medication. Blood pressure is better controlled this morning. Would continue with carvedilol, losartan, and HCTZ.  3. HOCM: No significant LVOT obstruction on echo - there is severe posterior leaflet prolapse which likely has created a ball-valve to decrease the degree of MR to only a trivial amount. At this point, there is no clear surgical indication.  4. Tobacco abuse: Currently with nicotine patch, reinforced importance of cessation.  5. COPD: Noted to have expiratory wheezing this morning, received treatment via [facility] while being transported to [facility] for [employer].   BP improved- symptoms have improved. Echo and myoview are reassuring. Likely can be discharged today and follow-up with Dr. Radford Pax. Cardiology will sign-off. Call with questions.  Pixie Casino, MD, [facility] Attending Cardiologist [facility]   04/19/2016 8:57 AM

## 2016-04-21 ENCOUNTER — Other Ambulatory Visit: Payer: Self-pay

## 2016-04-21 ENCOUNTER — Telehealth: Payer: Self-pay

## 2016-04-21 DIAGNOSIS — G4700 Insomnia, unspecified: Secondary | ICD-10-CM

## 2016-04-21 MED ORDER — ZOLPIDEM TARTRATE 5 MG PO TABS: 5.0000 mg | ORAL_TABLET | Freq: Every evening | ORAL | Status: DC | PRN

## 2016-04-21 NOTE — Telephone Encounter (Signed)
It looks like this was given x 1 month back in October.  If she needs a refill for insomnia,  ok to send 30 day supply no additional refills.

## 2016-04-21 NOTE — Telephone Encounter (Signed)
Transition Care Management Follow-up Telephone Call  ADMISSION DATE: 04/17/16   DISCHARGE DATE: 04/19/16   How have you been since you were released from the hospital? Patient states she has had leg pain and feeling lightheaded.   Do you understand why you were in the hospital? YES   Do you understand the discharge instrcutions? Yes  Items Reviewed:  Medications reviewed:  Reviewed medications however, several patient has not picked up from pharmacy yet. Needs clarification on Ambien.  Allergies reviewed:Ace Inhibitors - Patient has 1 kidney  Dietary changes reviewed: Cardiac Low Salt Dieat and 1.5 liter/day fluid restriction.  Referrals reviewed: Appointment scheduled with Daun Peacock PA on Monday July 10th 2017.  Functional Questionnaire:   Activities of Daily Living (ADLs):  No help needed at this time   Any transportation issues/concerns?: No   Any patient concerns? Right and Left leg pain from thighs to feet. Dizziness periodically    Confirmed importance and date/time of follow-up visits scheduled: Yes   Confirmed with patient if condition begins to worsen call PCP or go to the ER. Yes, patient agreed.    Patient was given the Call-a-Nurse line [phone]: Yes

## 2016-04-21 NOTE — Telephone Encounter (Signed)
Below Rx was printed previously and just faxed in to the pharmacy.

## 2016-04-21 NOTE — Telephone Encounter (Signed)
Rx for Ambien faxed to pharmacy per discussion with patient.

## 2016-04-21 NOTE — Telephone Encounter (Signed)
Spoke with patient states Advil not helping with sleep. Per M. Osullivan,ok to call in Ambien #[phone] with 0 RF which has been ordered for patient in the past.

## 2016-04-28 ENCOUNTER — Inpatient Hospital Stay: Payer: Medicare Other | Admitting: Physician Assistant

## 2016-04-30 ENCOUNTER — Ambulatory Visit (INDEPENDENT_AMBULATORY_CARE_PROVIDER_SITE_OTHER): Payer: Medicare Other | Admitting: Physician Assistant

## 2016-04-30 ENCOUNTER — Encounter: Payer: Self-pay | Admitting: Physician Assistant

## 2016-04-30 VITALS — BP 90/68 | HR 60 | Temp 98.2°F | Resp 16 | Ht 61.0 in | Wt 143.5 lb

## 2016-04-30 DIAGNOSIS — R079 Chest pain, unspecified: Secondary | ICD-10-CM | POA: Diagnosis not present

## 2016-04-30 DIAGNOSIS — I10 Essential (primary) hypertension: Secondary | ICD-10-CM

## 2016-04-30 DIAGNOSIS — Z720 Tobacco use: Secondary | ICD-10-CM

## 2016-04-30 DIAGNOSIS — Z09 Encounter for follow-up examination after completed treatment for conditions other than malignant neoplasm: Secondary | ICD-10-CM

## 2016-04-30 DIAGNOSIS — N183 Chronic kidney disease, stage 3 unspecified: Secondary | ICD-10-CM

## 2016-04-30 DIAGNOSIS — N179 Acute kidney failure, unspecified: Secondary | ICD-10-CM

## 2016-04-30 DIAGNOSIS — Z5189 Encounter for other specified aftercare: Secondary | ICD-10-CM | POA: Diagnosis not present

## 2016-04-30 LAB — COMPREHENSIVE METABOLIC PANEL
ALK PHOS: 61 U/L (ref 39–117)
ALT: 14 U/L (ref 0–35)
AST: 19 U/L (ref 0–37)
Albumin: 4.5 g/dL (ref 3.5–5.2)
BILIRUBIN TOTAL: 0.4 mg/dL (ref 0.2–1.2)
BUN: 36 mg/dL — AB (ref 6–23)
CO2: 27 mEq/L (ref 19–32)
Calcium: 10.5 mg/dL (ref 8.4–10.5)
Chloride: 97 mEq/L (ref 96–112)
Creatinine, Ser: 2.35 mg/dL — ABNORMAL HIGH (ref 0.40–1.20)
GFR: 21.84 mL/min — ABNORMAL LOW (ref >60.00)
GLUCOSE: 94 mg/dL (ref 70–99)
Potassium: 4.4 mEq/L (ref 3.5–5.1)
SODIUM: 140 meq/L (ref 135–145)
TOTAL PROTEIN: 8.1 g/dL (ref 6.0–8.3)

## 2016-04-30 LAB — CBC
HCT: 38.7 % (ref 36.0–46.0)
Hemoglobin: 13 g/dL (ref 12.0–15.0)
MCHC: 33.5 g/dL (ref 30.0–36.0)
MCV: 87.3 fl (ref 78.0–100.0)
PLATELETS: 343 10*3/uL (ref 150.0–400.0)
RBC: 4.44 Mil/uL (ref 3.87–5.11)
RDW: 13.7 % (ref 11.5–15.5)
WBC: 7.5 10*3/uL (ref 4.0–10.5)

## 2016-04-30 MED ORDER — VARENICLINE TARTRATE 0.5 MG X 11 & 1 MG X 42 PO MISC: ORAL | Status: DC

## 2016-04-30 NOTE — Progress Notes (Signed)
Patient with history of hypertension, hyperlipidemia, tobacco use, COPD and CKD presents to clinic today for hospital follow-up. Patient admitted to hospital on 04/17/16 after presenting earlier to ER with mid-sternal chest pain. ER workup revealed poorly controlled BP, unremarkable labs. Giving her history Cardiology was consulted who recommended inpatient assessment.  During hospitalization, Echo obtained revealing compensated chronic diastolic HF with EF 16%. Patient restarted on ASA and BB started. Lexiscan obtained and stable. Lipitor added for elevated TGL levels.   Since discharge, patient endorses taking medications as directed. Patient denies chest pain, palpitations, lightheadedness, dizziness, vision changes or frequent headaches. Endorses BP has been running low in the 90s/60s at home. Has follow-up scheduled with Pulmonology and Cardiology [facility])   Past Medical History:  Diagnosis Date  . Arthritis   . Cholelithiasis 2011   s/p cholescystectomy   . COPD (chronic obstructive pulmonary disease) (HCC)    no history of PFTs, only diagnosed by CXR, not on any inhalers because she has not had a PCP in over 2 years  . Emphysema 2010   dx'd/CXR  . GERD (gastroesophageal reflux disease)   . History of heartburn   . Hypercholesteremia    previously on pravastatin   . Hypertension    previously on Lisinopril/HCTZ  . Murmur    no  prior work-up  . Pneumonia 1980's   "walking"  . Renal cell carcinoma 01/2010   s/p right radical nephrectomy 01/2010,  follow[facility]  . Shortness of breath 09/26/11   "lately all the time"    Current Outpatient Prescriptions on File Prior to Visit  Medication Sig Dispense Refill  . albuterol (PROVENTIL) (2.5 MG/3ML) 0.083% nebulizer solution Take 3 mLs (2.5 mg total) by nebulization every 6 (six) hours as needed for wheezing or shortness of breath. 150 mL 1  . aspirin 81 MG chewable tablet Chew 1 tablet (81 mg total) by mouth daily. 30  tablet 0  . atorvastatin (LIPITOR) 20 MG tablet Take 1 tablet (20 mg total) by mouth daily at 6 PM. 30 tablet 0  . budesonide-formoterol (SYMBICORT) 160-4.5 MCG/ACT inhaler Inhale 2 puffs into the lungs 2 (two) times daily. 1 Inhaler 3  . fenofibrate 160 MG tablet Take 1 tablet (160 mg total) by mouth daily. 30 tablet 0  . Ibuprofen-Diphenhydramine HCl (ADVIL PM) 200-25 MG CAPS Take 1 tablet by mouth at bedtime as needed (Sleep.).    Marland Kitchen metoprolol tartrate (LOPRESSOR) 25 MG tablet Take 1 tablet (25 mg total) by mouth 2 (two) times daily. 60 tablet 0  . nitroGLYCERIN (NITROSTAT) 0.4 MG SL tablet Place 1 tablet (0.4 mg total) under the tongue every 5 (five) minutes as needed for chest pain. 30 tablet 0  . zolpidem (AMBIEN) 5 MG tablet Take 1 tablet (5 mg total) by mouth at bedtime as needed for sleep. (Patient not taking: Reported on 04/30/2016) 30 tablet 0   No current facility-administered medications on file prior to visit.     Allergies  Allergen Reactions  . Ace Inhibitors     Pseudoasthma/ renal failure    Family History  Problem Relation Age of Onset  . COPD Mother     DECEASED/SMOKED    Social History   Social History  . Marital status: Married    Spouse name: N/A  . Number of children: 3  . Years of education: 12   Occupational History  . [employer]   Social History Main Topics  .  Smoking status: Current Every Day Smoker    Packs/day: 1.00    Years: 53.00    Types: Cigarettes  . Smokeless tobacco: Never Used  . Alcohol use 0.0 oz/week     Comment:  "drink very rarely"  . Drug use: No     Comment: hx of cocaine last in 2006  . Sexual activity: No   Other Topics Concern  . None   Social History Narrative   Lives in [place] with her husband.    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    Patient has 3 grown children that do not live with her.    Patient does not have any  medical insurance.    Review of Systems - See HPI.  All other ROS are negative.  BP 90/68   Pulse 60   Temp 98.2 F (36.8 C) (Oral)   Resp 16   Ht 5' 1"  (1.549 m)   Wt 143 lb 8 oz (65.1 kg)   SpO2 98%   BMI 27.11 kg/m   Physical Exam  Constitutional: She is oriented to person, place, and time and well-developed, well-nourished, and in no distress.  HENT:  Head: Normocephalic and atraumatic.  Eyes: Conjunctivae are normal. Pupils are equal, round, and reactive to light.  Neck: Neck supple.  Cardiovascular: Normal rate, regular rhythm, normal heart sounds and intact distal pulses.   Pulmonary/Chest: Effort normal and breath sounds normal. No respiratory distress. She has no wheezes. She has no rales. She exhibits no tenderness.  Neurological: She is alert and oriented to person, place, and time.  Skin: Skin is warm. No rash noted.  Psychiatric: Affect normal.  Vitals reviewed.   Recent Results (from the past 2160 hour(s))  Basic metabolic panel     Status: Abnormal   Collection Time: 04/17/16  2:37 PM  Result Value Ref Range   Sodium 139 135 - 145 mmol/L   Potassium 4.2 3.5 - 5.1 mmol/L   Chloride 108 101 - 111 mmol/L   CO2 24 22 - 32 mmol/L   Glucose, Bld 88 65 - 99 mg/dL   BUN 15 6 - 20 mg/dL   Creatinine, Ser 1.28 (H) 0.44 - 1.00 mg/dL   Calcium 9.6 8.9 - 10.3 mg/dL   GFR calc non Af Amer 42 (L) >60 mL/min   GFR calc Af Amer 49 (L) >60 mL/min    Comment: (NOTE) The eGFR has been calculated using the CKD EPI equation. This calculation has not been validated in all clinical situations. eGFR's persistently <60 mL/min signify possible Chronic Kidney Disease.    Anion gap 7 5 - 15  CBC with Differential     Status: None   Collection Time: 04/17/16  2:37 PM  Result Value Ref Range   WBC 6.3 4.0 - 10.5 K/uL   RBC 4.46 3.87 - 5.11 MIL/uL   Hemoglobin 13.1 12.0 - 15.0 g/dL   HCT 39.4 36.0 - 46.0 %   MCV 88.3 78.0 - 100.0 fL   MCH 29.4 26.0 - 34.0 pg   MCHC 33.2  30.0 - 36.0 g/dL   RDW 13.2 11.5 - 15.5 %   Platelets 304 150 - 400 K/uL   Neutrophils Relative % 54 %   Neutro Abs 3.4 1.7 - 7.7 K/uL   Lymphocytes Relative 36 %   Lymphs Abs 2.2 0.7 - 4.0 K/uL   Monocytes Relative 5 %   Monocytes Absolute 0.3 0.1 - 1.0 K/uL   Eosinophils  Relative 4 %   Eosinophils Absolute 0.2 0.0 - 0.7 K/uL   Basophils Relative 1 %   Basophils Absolute 0.0 0.0 - 0.1 K/uL  I-stat troponin, ED     Status: None   Collection Time: 04/17/16  2:55 PM  Result Value Ref Range   Troponin i, poc 0.00 0.00 - 0.08 ng/mL   Comment 3            Comment: Due to the release kinetics of cTnI, a negative result within the first hours of the onset of symptoms does not rule out myocardial infarction with certainty. If myocardial infarction is still suspected, repeat the test at appropriate intervals.   Troponin I-serum (0, 3, 6 hours)     Status: None   Collection Time: 04/17/16  6:29 PM  Result Value Ref Range   Troponin I <0.03 <0.03 ng/mL  Troponin I-serum (0, 3, 6 hours)     Status: None   Collection Time: 04/17/16  9:57 PM  Result Value Ref Range   Troponin I <0.03 <0.03 ng/mL  Troponin I-serum (0, 3, 6 hours)     Status: None   Collection Time: 04/18/16 12:02 AM  Result Value Ref Range   Troponin I <0.03 <0.03 ng/mL  Lipid panel     Status: Abnormal   Collection Time: 04/18/16  5:13 AM  Result Value Ref Range   Cholesterol 280 (H) 0 - 200 mg/dL   Triglycerides 460 (H) <150 mg/dL   HDL 47 >40 mg/dL   Total CHOL/HDL Ratio 6.0 RATIO   VLDL UNABLE TO CALCULATE IF TRIGLYCERIDE OVER 400 mg/dL 0 - 40 mg/dL   LDL Cholesterol UNABLE TO CALCULATE IF TRIGLYCERIDE OVER 400 mg/dL 0 - 99 mg/dL    Comment:        Total Cholesterol/HDL:CHD Risk Coronary Heart Disease Risk Table                     Men   Women  1/2 Average Risk   3.4   3.3  Average Risk       5.0   4.4  2 X Average Risk   9.6   7.1  3 X Average Risk  23.4   11.0        Use the calculated Patient  Ratio above and the CHD Risk Table to determine the patient's CHD Risk.        ATP III CLASSIFICATION (LDL):  <100     mg/dL   Optimal  100-129  mg/dL   Near or Above                    Optimal  130-159  mg/dL   Borderline  160-189  mg/dL   High  >190     mg/dL   Very High Performed at [facility] / EF     Status: None   Collection Time: 04/18/16 11:03 AM  Result Value Ref Range   Rest HR 70 bpm   Rest BP 130/90 mmHg   Exercise duration (min) 4 min   Exercise duration (sec)  sec   Estimated workload  METS   Peak HR 110 bpm   Peak BP 150/102 mmHg   MPHR  bpm   Percent HR  %   RPE     LV sys vol  mL   TID     LV dias vol  46 - 106 mL  LHR     SSS     SRS     SDS    ECHO COMPLETE     Status: Abnormal   Collection Time: 04/18/16  2:22 PM  Result Value Ref Range   Weight 2,337.6 oz   Height 61 in   BP 130/82 mmHg   LV PW d 10 (A) 0.6 - 1.1 mm   FS 36 28 - 44 %   LA vol 37.1 mL   LA ID, A-P, ES 36 mm   IVS/LV PW RATIO, ED 1.26    LVOT VTI 32.5 cm   LV e' LATERAL 7.29 cm/s   LV E/e' medial 12.51    LV E/e'average 12.51    LA diam index 2.11 cm/m2   LA vol A4C 29.4 ml   LVOT peak grad rest 8 mmHg   E decel time 204 msec   LVOT diameter 21 mm   LVOT area 3.46 cm2   LVOT peak vel 137 cm/s   LVOT SV 112 mL   Peak grad 3 mmHg   E/e' ratio 12.51    MV pk E vel 91.2 m/s   MV pk A vel 109 m/s   LA vol index 21.7 mL/m2   MV Dec 204    LA diam end sys 36 mm   TDI e' medial 6.64    TDI e' lateral 7.29   CBC     Status: None   Collection Time: 04/30/16 12:18 PM  Result Value Ref Range   WBC 7.5 4.0 - 10.5 K/uL   RBC 4.44 3.87 - 5.11 Mil/uL   Platelets 343.0 150.0 - 400.0 K/uL   Hemoglobin 13.0 12.0 - 15.0 g/dL   HCT 38.7 36.0 - 46.0 %   MCV 87.3 78.0 - 100.0 fl   MCHC 33.5 30.0 - 36.0 g/dL   RDW 13.7 11.5 - 15.5 %  Comp Met (CMET)     Status: Abnormal   Collection Time: 04/30/16 12:18 PM  Result Value Ref  Range   Sodium 140 135 - 145 mEq/L   Potassium 4.4 3.5 - 5.1 mEq/L   Chloride 97 96 - 112 mEq/L   CO2 27 19 - 32 mEq/L   Glucose, Bld 94 70 - 99 mg/dL   BUN 36 (H) 6 - 23 mg/dL   Creatinine, Ser 2.35 (H) 0.40 - 1.20 mg/dL   Total Bilirubin 0.4 0.2 - 1.2 mg/dL   Alkaline Phosphatase 61 39 - 117 U/L   AST 19 0 - 37 U/L   ALT 14 0 - 35 U/L   Total Protein 8.1 6.0 - 8.3 g/dL   Albumin 4.5 3.5 - 5.2 g/dL   Calcium 10.5 8.4 - 10.5 mg/dL   GFR 21.84 (L) >60.00 mL/min  Basic metabolic panel     Status: Abnormal   Collection Time: 05/08/16 10:43 AM  Result Value Ref Range   Sodium 137 135 - 145 mEq/L   Potassium 4.4 3.5 - 5.1 mEq/L   Chloride 101 96 - 112 mEq/L   CO2 27 19 - 32 mEq/L   Glucose, Bld 91 70 - 99 mg/dL   BUN 28 (H) 6 - 23 mg/dL   Creatinine, Ser 2.08 (H) 0.40 - 1.20 mg/dL   Calcium 10.1 8.4 - 10.5 mg/dL   GFR 25.14 (L) >60.00 mL/min  Urinalysis, Routine w reflex microscopic (not at [facility])     Status: Abnormal   Collection Time: 05/08/16 10:43 AM  Result Value Ref Range   Color, Urine  YELLOW Yellow;Lt. Yellow   APPearance CLEAR Clear   Specific Gravity, Urine >=1.030 (A) 1.000 - 1.030   pH 5.5 5.0 - 8.0   Total Protein, Urine TRACE (A) Negative   Urine Glucose NEGATIVE Negative   Ketones, ur NEGATIVE Negative   Bilirubin Urine SMALL (A) Negative   Hgb urine dipstick TRACE-INTACT (A) Negative   Urobilinogen, UA 0.2 0.0 - 1.0   Leukocytes, UA NEGATIVE Negative   Nitrite NEGATIVE Negative   WBC, UA 0-2/hpf 0-2/hpf   RBC / HPF 0-2/hpf 0-2/hpf   Mucus, UA Presence of (A) None   Squamous Epithelial / LPF Rare(0-4/hpf) Rare(0-4/hpf)  CULTURE, URINE COMPREHENSIVE     Status: None (Preliminary result)   Collection Time: 05/08/16 10:43 AM  Result Value Ref Range   Colony Count 10,000-50,000 CFU/mL    Preliminary Report STAPHYLOCOCCUS SPECIES    Colony Count 2,000 COLONIES/ML    Preliminary Report ENTEROCOCCUS SPECIES     Assessment/Plan: Essential hypertension BP  90/68. No improvement on recheck. Asymptomatic at present. Will decrease Metoprolol to 1/2 current dose until she is assessed by Dr. Debara Pickett. Continue other antihypertensive medications as directed.  Acute renal failure superimposed on stage 3 chronic kidney disease Will repeat labs today to assess that kidney function remains at baseline. FU with Nephrology as scheduled.  Tobacco abuse disorder Patient's mood has done very well with Wellbutrin, but no improvement in smoking cessation. Will begin trial of Chantix starter pack. Potential side effects discussed. She is to stop medication if these occur and call office. Patient voices understanding. FU 1 month.  Chest pain Resolved. Diastolic HF noted on Echo. Remains compensated. EF at 60%. Lexiscan stable.  Patient to continue current medication regimen with the decrease in metoprolol due to hypotension. FU scheduled. FU with Cardiology scheduled. Alarm signs/symptoms that would prompt 911 call have been discussed with patient who voices understanding.    [name], PA-C

## 2016-04-30 NOTE — Patient Instructions (Signed)
Please go to the lab for blood work. I will call you with your results.  Decrease Metoprolol to 1/2 tablet twice daily. Stop the Wellbutrin by decreasing to 1 tablet every other day for 1 week before stopping. Start the Chantix taking as directed. Continue all other medications as directed.  Call [facility] heart care at [phone] to schedule a hospital follow-up.  Follow-up with our office (Nurse visit) in 2 weeks for a BP recheck.  If you note any recurrent pain, please proceed to ER.

## 2016-05-06 ENCOUNTER — Telehealth: Payer: Self-pay | Admitting: Physician Assistant

## 2016-05-06 DIAGNOSIS — R350 Frequency of micturition: Secondary | ICD-10-CM

## 2016-05-06 DIAGNOSIS — N289 Disorder of kidney and ureter, unspecified: Secondary | ICD-10-CM

## 2016-05-06 DIAGNOSIS — M5410 Radiculopathy, site unspecified: Secondary | ICD-10-CM

## 2016-05-06 DIAGNOSIS — M25561 Pain in right knee: Secondary | ICD-10-CM

## 2016-05-06 DIAGNOSIS — M545 Low back pain: Secondary | ICD-10-CM

## 2016-05-06 DIAGNOSIS — M25562 Pain in left knee: Principal | ICD-10-CM

## 2016-05-06 NOTE — Telephone Encounter (Signed)
Pt says that she is still experiencing leg pain. Pt says that she is in so much pain. She would like to be advised on what she should do?    CB: [id number]

## 2016-05-07 ENCOUNTER — Other Ambulatory Visit: Payer: Self-pay

## 2016-05-07 ENCOUNTER — Telehealth: Payer: Self-pay | Admitting: Physician Assistant

## 2016-05-07 MED ORDER — LOSARTAN POTASSIUM-HCTZ 100-12.5 MG PO TABS: 1.0000 | ORAL_TABLET | Freq: Every day | ORAL | Status: DC

## 2016-05-07 NOTE — Telephone Encounter (Signed)
Rx request to pharmacy/SLS  

## 2016-05-07 NOTE — Telephone Encounter (Signed)
°  Relationship to patient: Self Can be reached: [phone]  Pharmacy:  [facility] [place] [phone] (Phone) [phone] (Fax)       Reason for call: Request refill on losartan-hydrochlorothiazide (HYZAAR) 100-12.5 MG per tablet [id number]  States her BP is going up and down

## 2016-05-07 NOTE — Telephone Encounter (Signed)
Spoke with patient and she states that she is having bilateral leg pain only when walking with weakness, having radiating pain down right leg [no numbness or tingling]. Pt also having urinary frequency. Per provider, placed orders for U/A and Urine culture and BMET for renal function recheck for BP medications; pt informed and scheduled lab appt for Thurs, 05/08/16. Placed Urgent Referral to Vascular Surgery to r/o claudification with Arterial US, pt informed, understood & agreed/SLS [id number]

## 2016-05-08 ENCOUNTER — Ambulatory Visit (HOSPITAL_BASED_OUTPATIENT_CLINIC_OR_DEPARTMENT_OTHER)
Admission: RE | Admit: 2016-05-08 | Discharge: 2016-05-08 | Disposition: A | Discharge status: Home / Self Care | Payer: Medicare Other | Source: Ambulatory Visit | Attending: Physician Assistant | Admitting: Physician Assistant

## 2016-05-08 ENCOUNTER — Other Ambulatory Visit (INDEPENDENT_AMBULATORY_CARE_PROVIDER_SITE_OTHER): Payer: Medicare Other

## 2016-05-08 ENCOUNTER — Other Ambulatory Visit: Payer: Self-pay | Admitting: *Deleted

## 2016-05-08 DIAGNOSIS — M545 Low back pain: Secondary | ICD-10-CM | POA: Diagnosis not present

## 2016-05-08 DIAGNOSIS — R079 Chest pain, unspecified: Secondary | ICD-10-CM | POA: Insufficient documentation

## 2016-05-08 DIAGNOSIS — I739 Peripheral vascular disease, unspecified: Principal | ICD-10-CM

## 2016-05-08 DIAGNOSIS — I6523 Occlusion and stenosis of bilateral carotid arteries: Secondary | ICD-10-CM | POA: Diagnosis not present

## 2016-05-08 DIAGNOSIS — J984 Other disorders of lung: Secondary | ICD-10-CM | POA: Insufficient documentation

## 2016-05-08 DIAGNOSIS — I779 Disorder of arteries and arterioles, unspecified: Secondary | ICD-10-CM

## 2016-05-08 DIAGNOSIS — N289 Disorder of kidney and ureter, unspecified: Secondary | ICD-10-CM

## 2016-05-08 DIAGNOSIS — R350 Frequency of micturition: Secondary | ICD-10-CM

## 2016-05-08 DIAGNOSIS — R05 Cough: Secondary | ICD-10-CM | POA: Diagnosis not present

## 2016-05-08 LAB — URINALYSIS, ROUTINE W REFLEX MICROSCOPIC
KETONES UR: NEGATIVE
LEUKOCYTES UA: NEGATIVE
Nitrite: NEGATIVE
PH: 5.5 (ref 5.0–8.0)
Urine Glucose: NEGATIVE
Urobilinogen, UA: 0.2 (ref 0.0–1.0)

## 2016-05-08 LAB — BASIC METABOLIC PANEL
BUN: 28 mg/dL — ABNORMAL HIGH (ref 6–23)
CALCIUM: 10.1 mg/dL (ref 8.4–10.5)
CO2: 27 mEq/L (ref 19–32)
Chloride: 101 mEq/L (ref 96–112)
Creatinine, Ser: 2.08 mg/dL — ABNORMAL HIGH (ref 0.40–1.20)
GFR: 25.14 mL/min — AB (ref >60.00)
GLUCOSE: 91 mg/dL (ref 70–99)
Potassium: 4.4 mEq/L (ref 3.5–5.1)
SODIUM: 137 meq/L (ref 135–145)

## 2016-05-11 LAB — CULTURE, URINE COMPREHENSIVE: Colony Count: 2000

## 2016-05-11 NOTE — Assessment & Plan Note (Signed)
Resolved. Diastolic HF noted on Echo. Remains compensated. EF at 60%. Lexiscan stable.  Patient to continue current medication regimen with the decrease in metoprolol due to hypotension. FU scheduled. FU with Cardiology scheduled. Alarm signs/symptoms that would prompt 911 call have been discussed with patient who voices understanding.

## 2016-05-11 NOTE — Assessment & Plan Note (Signed)
Patient's mood has done very well with Wellbutrin, but no improvement in smoking cessation. Will begin trial of Chantix starter pack. Potential side effects discussed. She is to stop medication if these occur and call office. Patient voices understanding. FU 1 month.

## 2016-05-11 NOTE — Assessment & Plan Note (Signed)
BP 90/68. No improvement on recheck. Asymptomatic at present. Will decrease Metoprolol to 1/2 current dose until she is assessed by Dr. Debara Pickett. Continue other antihypertensive medications as directed.

## 2016-05-11 NOTE — Assessment & Plan Note (Signed)
Will repeat labs today to assess that kidney function remains at baseline. FU with Nephrology as scheduled.

## 2016-05-13 ENCOUNTER — Telehealth: Payer: Self-pay | Admitting: Vascular Surgery

## 2016-05-13 NOTE — Telephone Encounter (Signed)
Anderson Malta from [facility] called on 7/25 to see if we could move this patient's appointment up sooner from its original date (9/8). I called Anderson Malta back and left her a message to call me and we can see what we have available.

## 2016-05-14 ENCOUNTER — Encounter (HOSPITAL_COMMUNITY): Payer: Self-pay

## 2016-05-22 ENCOUNTER — Other Ambulatory Visit: Payer: Self-pay | Admitting: Vascular Surgery

## 2016-05-22 DIAGNOSIS — I739 Peripheral vascular disease, unspecified: Secondary | ICD-10-CM

## 2016-05-23 ENCOUNTER — Other Ambulatory Visit: Payer: Self-pay | Admitting: Vascular Surgery

## 2016-05-23 DIAGNOSIS — I739 Peripheral vascular disease, unspecified: Secondary | ICD-10-CM

## 2016-05-26 ENCOUNTER — Ambulatory Visit (HOSPITAL_COMMUNITY)
Admission: RE | Admit: 2016-05-26 | Discharge: 2016-05-26 | Disposition: A | Discharge status: Home / Self Care | Payer: Medicare Other | Source: Ambulatory Visit | Attending: Vascular Surgery | Admitting: Vascular Surgery

## 2016-05-26 ENCOUNTER — Ambulatory Visit (INDEPENDENT_AMBULATORY_CARE_PROVIDER_SITE_OTHER)
Admission: RE | Admit: 2016-05-26 | Discharge: 2016-05-26 | Disposition: A | Discharge status: Home / Self Care | Payer: Medicare Other | Source: Ambulatory Visit | Attending: Physician Assistant | Admitting: Physician Assistant

## 2016-05-26 ENCOUNTER — Ambulatory Visit (INDEPENDENT_AMBULATORY_CARE_PROVIDER_SITE_OTHER): Payer: Medicare Other | Admitting: Medical

## 2016-05-26 ENCOUNTER — Ambulatory Visit (HOSPITAL_BASED_OUTPATIENT_CLINIC_OR_DEPARTMENT_OTHER)
Admission: RE | Admit: 2016-05-26 | Discharge: 2016-05-26 | Disposition: A | Discharge status: Home / Self Care | Payer: Medicare Other | Source: Ambulatory Visit | Attending: Medical | Admitting: Medical

## 2016-05-26 ENCOUNTER — Encounter: Payer: Self-pay | Admitting: Medical

## 2016-05-26 VITALS — BP 120/80 | HR 88 | Temp 98.1°F | Ht 61.0 in | Wt 141.8 lb

## 2016-05-26 DIAGNOSIS — H6691 Otitis media, unspecified, right ear: Secondary | ICD-10-CM | POA: Diagnosis not present

## 2016-05-26 DIAGNOSIS — R05 Cough: Secondary | ICD-10-CM | POA: Insufficient documentation

## 2016-05-26 DIAGNOSIS — J209 Acute bronchitis, unspecified: Secondary | ICD-10-CM

## 2016-05-26 DIAGNOSIS — I739 Peripheral vascular disease, unspecified: Secondary | ICD-10-CM | POA: Insufficient documentation

## 2016-05-26 DIAGNOSIS — E785 Hyperlipidemia, unspecified: Secondary | ICD-10-CM | POA: Diagnosis not present

## 2016-05-26 DIAGNOSIS — R062 Wheezing: Secondary | ICD-10-CM

## 2016-05-26 DIAGNOSIS — R059 Cough, unspecified: Secondary | ICD-10-CM

## 2016-05-26 DIAGNOSIS — F17200 Nicotine dependence, unspecified, uncomplicated: Secondary | ICD-10-CM | POA: Insufficient documentation

## 2016-05-26 LAB — CBC WITH DIFFERENTIAL/PLATELET
BASOS ABS: 0 10*3/uL (ref 0.0–0.1)
Basophils Relative: 0.5 % (ref 0.0–3.0)
EOS ABS: 0 10*3/uL (ref 0.0–0.7)
EOS PCT: 0.2 % (ref 0.0–5.0)
HCT: 37 % (ref 36.0–46.0)
HEMOGLOBIN: 12.3 g/dL (ref 12.0–15.0)
LYMPHS ABS: 1.4 10*3/uL (ref 0.7–4.0)
Lymphocytes Relative: 18.1 % (ref 12.0–46.0)
MCHC: 33.2 g/dL (ref 30.0–36.0)
MCV: 87.9 fl (ref 78.0–100.0)
MONO ABS: 0.7 10*3/uL (ref 0.1–1.0)
Monocytes Relative: 8.8 % (ref 3.0–12.0)
NEUTROS PCT: 72.4 % (ref 43.0–77.0)
Neutro Abs: 5.5 10*3/uL (ref 1.4–7.7)
Platelets: 340 10*3/uL (ref 150.0–400.0)
RBC: 4.21 Mil/uL (ref 3.87–5.11)
RDW: 14 % (ref 11.5–15.5)
WBC: 7.6 10*3/uL (ref 4.0–10.5)

## 2016-05-26 MED ORDER — FENOFIBRATE 160 MG PO TABS: 160.0000 mg | ORAL_TABLET | Freq: Every day | ORAL | 0 refills | Status: DC

## 2016-05-26 MED ORDER — IPRATROPIUM-ALBUTEROL 0.5-2.5 (3) MG/3ML IN SOLN
3.0000 mL | Freq: Once | RESPIRATORY_TRACT | Status: AC
  Administered 2016-05-26: 3 mL via RESPIRATORY_TRACT

## 2016-05-26 MED ORDER — DOXYCYCLINE HYCLATE 100 MG PO TABS: 100.0000 mg | ORAL_TABLET | Freq: Two times a day (BID) | ORAL | 0 refills | Status: DC

## 2016-05-26 MED ORDER — FLUTICASONE PROPIONATE 50 MCG/ACT NA SUSP: 2.0000 | Freq: Every day | NASAL | 6 refills | Status: DC

## 2016-05-26 MED ORDER — LOSARTAN POTASSIUM-HCTZ 100-12.5 MG PO TABS
1.0000 | ORAL_TABLET | Freq: Every day | ORAL | 0 refills | Status: DC
Start: 2016-05-26 — End: 2016-06-03

## 2016-05-26 MED ORDER — HYDROCODONE-HOMATROPINE 5-1.5 MG/5ML PO SYRP: 5.0000 mL | ORAL_SOLUTION | Freq: Three times a day (TID) | ORAL | 0 refills | Status: DC | PRN

## 2016-05-26 MED ORDER — ATORVASTATIN CALCIUM 20 MG PO TABS: 20.0000 mg | ORAL_TABLET | Freq: Every day | ORAL | 0 refills | Status: DC

## 2016-05-26 MED ORDER — PREDNISONE 20 MG PO TABS: ORAL_TABLET | ORAL | 0 refills | Status: DC

## 2016-05-26 MED ORDER — METOPROLOL TARTRATE 25 MG PO TABS: 25.0000 mg | ORAL_TABLET | Freq: Two times a day (BID) | ORAL | 0 refills | Status: DC

## 2016-05-26 NOTE — Progress Notes (Signed)
Pre visit review using our clinic review tool, if applicable. No additional management support is needed unless otherwise documented below in the visit note. 

## 2016-05-26 NOTE — Patient Instructions (Addendum)
For bronchitis will rx doxycycline. Will use this for rt OM as well.  For wheezing we gave you duoneb treatment in office. Continue your 2 inhalers. Also add taper prednisone.  For cough rx hycodan.  For nasal congestion rx flonase.  Will get cxr and cbc today. Asses if any pneumonia.  Follow up in 7 days or as needed

## 2016-05-26 NOTE — Addendum Note (Signed)
Addended by: Tasia Catchings on: 05/26/2016 02:55 PM   Modules accepted: Orders

## 2016-05-26 NOTE — Progress Notes (Signed)
Subjective:    Patient ID: Katie [name], female    DOB: Nov 01, 1945, 70 y.o.   MRN: [id number]  HPI   Pt in states for about 3 days has been wheezing. Seemed to occur after exposure to son who had uri type signs and symptoms.   Pt is coughing up mucous with intermittent wheezing. Some disrupted sleep from cough over weekend but slept well over the weekend.  Pt feels some chills occasionally.   Pt has symbicort inhaler and used albuterol neb treatment 4 times yesterday.    Review of Systems  Constitutional: Positive for chills. Negative for fatigue and fever.  HENT: Negative for congestion and ear pain.   Respiratory: Positive for cough and wheezing. Negative for chest tightness and shortness of breath.   Cardiovascular: Negative for chest pain and palpitations.  Musculoskeletal: Negative for back pain.  Skin: Negative for rash.  Neurological: Negative for dizziness and light-headedness.  Hematological: Negative for adenopathy. Does not bruise/bleed easily.  Psychiatric/Behavioral: Negative for agitation and confusion.    Past Medical History:  Diagnosis Date  . Arthritis   . Cholelithiasis 2011   s/p cholescystectomy   . COPD (chronic obstructive pulmonary disease) (HCC)    no history of PFTs, only diagnosed by CXR, not on any inhalers because she has not had a PCP in over 2 years  . Emphysema 2010   dx'd/CXR  . GERD (gastroesophageal reflux disease)   . History of heartburn   . Hypercholesteremia    previously on pravastatin   . Hypertension    previously on Lisinopril/HCTZ  . Murmur    no  prior work-up  . Pneumonia 1980's   "walking"  . Renal cell carcinoma 01/2010   s/p right radical nephrectomy 01/2010,  follow[facility]  . Shortness of breath 09/26/11   "lately all the time"     Social History   Social History  . Marital status: Married    Spouse name: N/A  . Number of children: 3  . Years of education: 12   Occupational History  .  [employer]   Social History Main Topics  . Smoking status: Current Every Day Smoker    Packs/day: 1.00    Years: 53.00    Types: Cigarettes  . Smokeless tobacco: Never Used  . Alcohol use 0.0 oz/week     Comment:  "drink very rarely"  . Drug use: No     Comment: hx of cocaine last in 2006  . Sexual activity: No   Other Topics Concern  . Not on file   Social History Narrative   Lives in [place] with her husband.    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    Patient has 3 grown children that do not live with her.    Patient does not have any medical insurance.    Past Surgical History:  Procedure Laterality Date  . APPENDECTOMY  1970's  . CARDIAC CATHETERIZATION  09/2011  . CHOLECYSTECTOMY  01/2010  . DIAGNOSTIC LAPAROSCOPY    . LEFT AND RIGHT HEART CATHETERIZATION WITH CORONARY ANGIOGRAM N/A 09/30/2011   Procedure: LEFT AND RIGHT HEART CATHETERIZATION WITH CORONARY ANGIOGRAM;  Surgeon: Candee Furbish, MD;  [facility] C[facility] LAB;  Service: Cardiovascular;  Laterality: N/A;  . MULTIPLE EXTRACTIONS WITH ALVEOLOPLASTY  10/06/2011   Procedure: MULTIPLE EXTRACION WITH ALVEOLOPLASTY;  Surgeon: Lenn Cal, DDS;  Location: [place];  Service: Oral Surgery;  Laterality: N/A;  Multiple extraction of tooth #'s 1, 6, 8, 10, 11, 22, 23, 26, 27, 28, 29 with alveoloplasty and Upper right buccal exostoses reductions.  . NEPHRECTOMY RADICAL  01/2010   right   . OVARIAN CYST REMOVAL  1970's   "went through belly button"  . TEE WITHOUT CARDIOVERSION  10/01/2011   Procedure: TRANSESOPHAGEAL ECHOCARDIOGRAM (TEE);  Surgeon: [name];  Location: [place];  Service: Cardiovascular;  Laterality: N/A;  . TUBAL LIGATION  1970's  . US ECHOCARDIOGRAPHY  09/2011    Family History  Problem Relation Age of Onset  . COPD Mother     DECEASED/SMOKED    Allergies  Allergen Reactions  . Ace  Inhibitors     Pseudoasthma/ renal failure    Current Outpatient Prescriptions on File Prior to Visit  Medication Sig Dispense Refill  . albuterol (PROVENTIL) (2.5 MG/3ML) 0.083% nebulizer solution Take 3 mLs (2.5 mg total) by nebulization every 6 (six) hours as needed for wheezing or shortness of breath. 150 mL 1  . aspirin 81 MG chewable tablet Chew 1 tablet (81 mg total) by mouth daily. 30 tablet 0  . budesonide-formoterol (SYMBICORT) 160-4.5 MCG/ACT inhaler Inhale 2 puffs into the lungs 2 (two) times daily. 1 Inhaler 3  . Ibuprofen-Diphenhydramine HCl (ADVIL PM) 200-25 MG CAPS Take 1 tablet by mouth at bedtime as needed (Sleep.).    Marland Kitchen nitroGLYCERIN (NITROSTAT) 0.4 MG SL tablet Place 1 tablet (0.4 mg total) under the tongue every 5 (five) minutes as needed for chest pain. 30 tablet 0  . zolpidem (AMBIEN) 5 MG tablet Take 1 tablet (5 mg total) by mouth at bedtime as needed for sleep. 30 tablet 0   No current facility-administered medications on file prior to visit.     BP 120/80   Pulse 88   Temp 98.1 F (36.7 C) (Oral)   Ht 5\' 1"  (1.549 m)   Wt 141 lb 12.8 oz (64.3 kg)   SpO2 97%   BMI 26.79 kg/m       Objective:   Physical Exam  General  Mental Status - Alert. General Appearance - Well groomed. Not in acute distress.  Skin Rashes- No Rashes.  HEENT Head- Normal. Ear Auditory Canal - Left- Normal. Right - Normal.Tympanic Membrane- Left- Normal. Right- moderate red. Eye Sclera/Conjunctiva- Left- Normal. Right- Normal. Nose & Sinuses Nasal Mucosa- Left-  Boggy and Congested. Right-  Boggy and  Congested.Bilateral no  maxillary and no frontal sinus pressure. Mouth & Throat Lips: Upper Lip- Normal: no dryness, cracking, pallor, cyanosis, or vesicular eruption. Lower Lip-Normal: no dryness, cracking, pallor, cyanosis or vesicular eruption. Buccal Mucosa- Bilateral- No Aphthous ulcers. Oropharynx- No Discharge or Erythema. Tonsils: Characteristics- Bilateral- No Erythema  or Congestion. Size/Enlargement- Bilateral- No enlargement. Discharge- bilateral-None.  Neck Neck- Supple. No Masses.   Chest and Lung Exam Auscultation: Breath Sounds:- even and  Unlabored. But diffuse rhonchi and expiratory wheezing.(post neb treatment deeper and clearer)  Cardiovascular Auscultation:Rythm- Regular, rate and rhythm. Murmurs & Other Heart Sounds:Ausculatation of the heart reveal- No Murmurs.  Lymphatic Head & Neck General Head & Neck Lymphatics: Bilateral: Description- No Localized lymphadenopathy.       Assessment & Plan:  For bronchitis will rx doxycycline. Will use this for rt OM as well.  For wheezing we gave you duoneb treatment in office. Continue your 2 inhalers. Also add taper prednisone.  For cough rx hycodan.  For nasal congestion rx flonase.  Will get cxr and cbc. Assess if any pneumonia.  Follow up in 7 days or as needed  Breindy Meadow, Percell Miller, [employer]

## 2016-05-29 ENCOUNTER — Encounter: Payer: Self-pay | Admitting: Vascular Surgery

## 2016-06-03 ENCOUNTER — Ambulatory Visit (INDEPENDENT_AMBULATORY_CARE_PROVIDER_SITE_OTHER): Payer: Medicare Other | Admitting: Cardiovascular Disease

## 2016-06-03 ENCOUNTER — Encounter: Payer: Self-pay | Admitting: Cardiovascular Disease

## 2016-06-03 VITALS — BP 115/80 | HR 107 | Ht 61.0 in | Wt 138.0 lb

## 2016-06-03 DIAGNOSIS — E785 Hyperlipidemia, unspecified: Secondary | ICD-10-CM

## 2016-06-03 DIAGNOSIS — I10 Essential (primary) hypertension: Secondary | ICD-10-CM

## 2016-06-03 DIAGNOSIS — R110 Nausea: Secondary | ICD-10-CM | POA: Diagnosis not present

## 2016-06-03 DIAGNOSIS — Z720 Tobacco use: Secondary | ICD-10-CM

## 2016-06-03 DIAGNOSIS — I341 Nonrheumatic mitral (valve) prolapse: Secondary | ICD-10-CM

## 2016-06-03 DIAGNOSIS — R1013 Epigastric pain: Secondary | ICD-10-CM | POA: Diagnosis not present

## 2016-06-03 LAB — COMPREHENSIVE METABOLIC PANEL
ALBUMIN: 4.1 g/dL (ref 3.6–5.1)
ALT: 12 U/L (ref 6–29)
AST: 15 U/L (ref 10–35)
Alkaline Phosphatase: 64 U/L (ref 33–130)
BILIRUBIN TOTAL: 0.5 mg/dL (ref 0.2–1.2)
BUN: 21 mg/dL (ref 7–25)
CALCIUM: 10.9 mg/dL — AB (ref 8.6–10.4)
CHLORIDE: 101 mmol/L (ref 98–110)
CO2: 31 mmol/L (ref 20–31)
CREATININE: 1.81 mg/dL — AB (ref 0.50–0.99)
Glucose, Bld: 102 mg/dL — ABNORMAL HIGH (ref 65–99)
Potassium: 4.6 mmol/L (ref 3.5–5.3)
SODIUM: 139 mmol/L (ref 135–146)
TOTAL PROTEIN: 7.2 g/dL (ref 6.1–8.1)

## 2016-06-03 LAB — AMYLASE: AMYLASE: 59 U/L (ref 0–105)

## 2016-06-03 LAB — LIPASE: LIPASE: 56 U/L (ref 7–60)

## 2016-06-03 MED ORDER — IPRATROPIUM-ALBUTEROL 0.5-2.5 (3) MG/3ML IN SOLN: 3.0000 mL | Freq: Three times a day (TID) | RESPIRATORY_TRACT | 0 refills | Status: DC | PRN

## 2016-06-03 MED ORDER — LOSARTAN POTASSIUM 100 MG PO TABS: 100.0000 mg | ORAL_TABLET | Freq: Every day | ORAL | 5 refills | Status: DC

## 2016-06-03 NOTE — Progress Notes (Signed)
Cardiology Office Note   Date:  06/03/2016   ID:  Katie [name], DOB 02-15-1946, MRN [id number]  PCP:  [name], PA-C  Cardiologist:   Skeet Latch, MD   Chief Complaint  Patient presents with  . New Patient (Initial Visit)    sob; frequently. lightheaded; when standing up     History of Present Illness: Katie [name] is a 70 y.o. female with hypertension, severe mitral valve prolapse, moderate carotid stenosis, CKD II, RCC s/p R nephrectomy in 2011, hyperlipidemia and tobacco abuse who presents for follow up after hospitalization.  Katie [name] was hospitalized 03/2016 with chest pain and hypertensive urgency in the setting of not taking her antihypertensives.  Cardiac enzymes were negative.   She had an echo 04/18/16 that revealed LVEF 55-60% with grade 1 diastolic dysfunction.  There was severe prolapse of the posterior mitral valve leaflet but only trivial MR.  She had a Lexiscan Myoview that revealed a small, fixed defect at the basal septum consistent with HCM.  Her home HCTZ-losartan was restarted and metoprolol was added. She was also started on atorvastatin due to hyperlipidemia.    Katie [name] followed up with her PCP, [name], PA-C, on 7/12 and her BP was 90/68 so her metoprolol was reduced to 12.5mg  bid. She was started on Chantix for smoking cessation. Since being discharged from the hospital she has multiple complaints.  She notes epigastric abdominal pain that is worse after eating.  Her appetite has been poor and she report losing 7 lb unintentionally.  She also reports emesis and fatigue.  She feels very lightheaded with positional changes but denies syncope.  She reports sharp pain in bilateral legs when walking short distances.  She had ABIs 05/26/16 that were negative for PAD.  Ms. Lout started Chantix one week ago and stopped smoking two days ago.  She notes that her cough has increased.  She also endorses headache and depressed mood.  She  denies SI/HI and has not had any bad dreams.  She does not have any cravings for cigarettes.   Past Medical History:  Diagnosis Date  . Arthritis   . Cholelithiasis 2011   s/p cholescystectomy   . COPD (chronic obstructive pulmonary disease) (HCC)    no history of PFTs, only diagnosed by CXR, not on any inhalers because she has not had a PCP in over 2 years  . Emphysema 2010   dx'd/CXR  . GERD (gastroesophageal reflux disease)   . History of heartburn   . Hypercholesteremia    previously on pravastatin   . Hypertension    previously on Lisinopril/HCTZ  . Murmur    no  prior work-up  . Pneumonia 1980's   "walking"  . Renal cell carcinoma 01/2010   s/p right radical nephrectomy 01/2010,  followed [facility]  . Shortness of breath 09/26/11   "lately all the time"    Past Surgical History:  Procedure Laterality Date  . APPENDECTOMY  1970's  . CARDIAC CATHETERIZATION  09/2011  . CHOLECYSTECTOMY  01/2010  . DIAGNOSTIC LAPAROSCOPY    . LEFT AND RIGHT HEART CATHETERIZATION WITH CORONARY ANGIOGRAM N/A 09/30/2011   Procedure: LEFT AND RIGHT HEART CATHETERIZATION WITH CORONARY ANGIOGRAM;  Surgeon: Candee Furbish, MD;  Location: [facility] CATH LAB;  Service: Cardiovascular;  Laterality: N/A;  . MULTIPLE EXTRACTIONS WITH ALVEOLOPLASTY  10/06/2011   Procedure: MULTIPLE EXTRACION WITH ALVEOLOPLASTY;  Surgeon: Lenn Cal, DDS;  Location: [facility];  Service: Oral Surgery;  Laterality: N/A;  Multiple extraction of tooth #'s 1, 6, 8, 10, 11, 22, 23, 26, 27, 28, 29 with alveoloplasty and Upper right buccal exostoses reductions.  . NEPHRECTOMY RADICAL  01/2010   right   . OVARIAN CYST REMOVAL  1970's   "went through belly button"  . TEE WITHOUT CARDIOVERSION  10/01/2011   Procedure: TRANSESOPHAGEAL ECHOCARDIOGRAM (TEE);  Surgeon: [name];  Location: [place];  Service: Cardiovascular;  Laterality: N/A;  . TUBAL LIGATION  1970's  . US ECHOCARDIOGRAPHY  09/2011     Current  Outpatient Prescriptions  Medication Sig Dispense Refill  . albuterol (PROVENTIL) (2.5 MG/3ML) 0.083% nebulizer solution Take 3 mLs (2.5 mg total) by nebulization every 6 (six) hours as needed for wheezing or shortness of breath. 150 mL 1  . aspirin 81 MG tablet Take 81 mg by mouth daily.    Marland Kitchen atorvastatin (LIPITOR) 20 MG tablet Take 1 tablet (20 mg total) by mouth daily at 6 PM. 30 tablet 0  . budesonide-formoterol (SYMBICORT) 160-4.5 MCG/ACT inhaler Inhale 2 puffs into the lungs 2 (two) times daily. 1 Inhaler 3  . fenofibrate 160 MG tablet Take 1 tablet (160 mg total) by mouth daily. 30 tablet 0  . metoprolol tartrate (LOPRESSOR) 25 MG tablet Take 1 tablet (25 mg total) by mouth 2 (two) times daily. 60 tablet 0  . zolpidem (AMBIEN) 5 MG tablet Take 1 tablet (5 mg total) by mouth at bedtime as needed for sleep. 30 tablet 0  . ipratropium-albuterol (DUONEB) 0.5-2.5 (3) MG/3ML SOLN Take 3 mLs by nebulization 3 (three) times daily as needed. 360 mL 0  . losartan (COZAAR) 100 MG tablet Take 1 tablet (100 mg total) by mouth daily. 30 tablet 5   No current facility-administered medications for this visit.     Allergies:   Ace inhibitors    Social History:  The patient  reports that she has been smoking Cigarettes.  She has a 53.00 pack-year smoking history. She has never used smokeless tobacco. She reports that she drinks alcohol. She reports that she does not use drugs.   Family History:  The patient's family history includes COPD in her mother; Diabetes in her brother and brother.    ROS:  Please see the history of present illness.   Otherwise, review of systems are positive for none.   All other systems are reviewed and negative.    PHYSICAL EXAM: VS:  BP 115/80   Pulse (!) 107   Ht 5\' 1"  (1.549 m)   Wt 138 lb (62.6 kg)   BMI 26.07 kg/m  , BMI Body mass index is 26.07 kg/m. GENERAL:  Well appearing HEENT:  Pupils equal round and reactive, fundi not visualized, oral mucosa  unremarkable NECK:  No jugular venous distention, waveform within normal limits, carotid upstroke brisk and symmetric, no bruits, no thyromegaly LYMPHATICS:  No cervical adenopathy LUNGS:  Clear to auscultation bilaterally HEART:  RRR.  PMI not displaced or sustained,S1 and S2 within normal limits, no S3, no S4, no clicks, no rubs, no murmurs ABD:  Flat, positive bowel sounds normal in frequency in pitch, no bruits, no rebound, +epigastric guarding, no midline pulsatile mass, no hepatomegaly, no splenomegaly.  + epigastric TTP. EXT:  2 plus pulses throughout, no edema, no cyanosis no clubbing SKIN:  No rashes no nodules NEURO:  Cranial nerves II through XII grossly intact, motor grossly intact throughout PSYCH:  Cognitively intact, oriented to person place and time   EKG:  EKG is ordered today. The  ekg ordered today demonstrates sinus tachycardia rate 107 bpm.  LVH  Echo 04/18/16: Study Conclusions  - Left ventricle: The cavity size was normal. Wall thickness was   increased in a pattern of mild LVH. There was focal basal   hypertrophy. Systolic function was normal. The estimated ejection   fraction was in the range of 55% to 60%. Wall motion was normal;   there were no regional wall motion abnormalities. Doppler   parameters are consistent with abnormal left ventricular   relaxation (grade 1 diastolic dysfunction).  Carotid Doppler 05/08/16: IMPRESSION: 1. Mild (1-49%) stenosis proximal right internal carotid artery secondary to heterogenous atherosclerotic plaque. 2. Moderate (50-69%) stenosis proximal left internal carotid artery secondary to heterogenous atherosclerotic plaque. 3. Vertebral arteries are patent with normal antegrade flow.  Recent Labs: 04/30/2016: ALT 14 05/08/2016: BUN 28; Creatinine, Ser 2.08; Potassium 4.4; Sodium 137 05/26/2016: Hemoglobin 12.3; Platelets 340.0    Lipid Panel    Component Value Date/Time   CHOL 280 (H) 04/18/2016 0513   TRIG 460 (H)  04/18/2016 0513   HDL 47 04/18/2016 0513   CHOLHDL 6.0 04/18/2016 0513   VLDL UNABLE TO CALCULATE IF TRIGLYCERIDE OVER 400 mg/dL 04/18/2016 0513   LDLCALC UNABLE TO CALCULATE IF TRIGLYCERIDE OVER 400 mg/dL 04/18/2016 0513      Wt Readings from Last 3 Encounters:  06/03/16 138 lb (62.6 kg)  05/26/16 141 lb 12.8 oz (64.3 kg)  04/30/16 143 lb 8 oz (65.1 kg)      ASSESSMENT AND PLAN:  # Hypertensive heart disease: Blood pressure is well-controlled.  However, she now has low BP and orthostatic symptoms.  We will stop HCTZ and continue losartan 100 mg daily and metoprolol 25 mg daily.  # Severe mitral valve prolapse: Ms. Greever has severe prolapse of the posterior leaflet but only mild MR.  We will continue to monitor.  She does not have any evidence of heart failure on exam.  # Epigastric pain: Ms. Osterkamp has known hypertriglyceridemia.  She has epigastric pain that is concerning for pancreatitis.  We will check amylase, lipase, and a CMP.  She is s/p cholecystectomy.  # Carotid stenosis: Moderate L ICA stenosis and mild R ICA stenosis.  Start aspirin 81 mg daily and continue atorvastatin.  Repeat lipids at follow up.  # Tobacco abuse:  Ms. Debbrah Alar was congratulated on smoking cessation.  Her mood is depressed on Chantix.  She wants to continue it because it is helping with smoking cessation.  She understands that if her moods worsens or if she starts thinking of harming herself or others, she should stop Chantix.   Current medicines are reviewed at length with the patient today.  The patient does not have concerns regarding medicines.  The following changes have been made:  Stop HCTZ.  Add aspirin 81 mg daily.  Labs/ tests ordered today include:   Orders Placed This Encounter  Procedures  . Comprehensive metabolic panel  . Amylase  . Lipase  . EKG 12-Lead     Disposition:   FU with Liyat Faulkenberry C. Oval Linsey, MD, [facility] in 1 month    This note was written with the assistance  of speech recognition software.  Please excuse any transcriptional errors.  Signed, Avett Reineck C. Oval Linsey, MD, [facility]  06/03/2016 4:12 PM    [facility]

## 2016-06-03 NOTE — Patient Instructions (Addendum)
Medication Instructions:  START ASPIRIN 81 MG DAILY  STOP LOSARTAN/HCTZ   START LOSARTAN 100 MG DAILY   START IPRATROPIUM ALBUTEROL THREE TIMES AS DAY AS NEEDED FOR COUGH/WHEEZING  Labwork: CMET/LIPASE/AMYLASE AT [facility] ON THE FIRST FLOOR  Testing/Procedures: NONE  Follow-Up: Your physician recommends that you schedule a follow-up appointment in: 1 MONTH OV  Any Other Special Instructions Will Be Listed Below (If Applicable).     If you need a refill on your cardiac medications before your next appointment, please call your pharmacy.

## 2016-06-04 ENCOUNTER — Ambulatory Visit (INDEPENDENT_AMBULATORY_CARE_PROVIDER_SITE_OTHER): Payer: Medicare Other | Admitting: Vascular Surgery

## 2016-06-04 ENCOUNTER — Encounter: Payer: Self-pay | Admitting: Vascular Surgery

## 2016-06-04 VITALS — BP 117/81 | HR 82 | Temp 98.5°F | Resp 18 | Ht 61.0 in | Wt 137.0 lb

## 2016-06-04 DIAGNOSIS — I739 Peripheral vascular disease, unspecified: Secondary | ICD-10-CM

## 2016-06-04 NOTE — Progress Notes (Signed)
Referring Physician: Dr [facility]  Patient name: Katie [name] MRN: [id number] DOB: Jul 27, 1946 Sex: female  REASON FOR CONSULT: Bilateral leg pain  HPI: Katie [name] is a 70 y.o. female,  who complains of a one year history of bilateral lower extremity calf pain after walking for about 15 minutes. The pain is very reproducible at 15 minutes. She denies rest pain. She denies history of nonhealing wounds. She does have a long history of smoking. She denies history of diabetes. She also has a history of coronary artery disease. She also has a history of carotid artery occlusive disease that has been a symptomatically. Both of these a been followed by her cardiologist. Other medical problems include COPD and elevated cholesterol which is stable. He is on aspirin.  Past Medical History:  Diagnosis Date  . Arthritis   . Cholelithiasis 2011   s/p cholescystectomy   . COPD (chronic obstructive pulmonary disease) (HCC)    no history of PFTs, only diagnosed by CXR, not on any inhalers because she has not had a PCP in over 2 years  . Emphysema 2010   dx'd/CXR  . GERD (gastroesophageal reflux disease)   . History of heartburn   . Hypercholesteremia    previously on pravastatin   . Hypertension    previously on Lisinopril/HCTZ  . Murmur    no  prior work-up  . Pneumonia 1980's   "walking"  . Renal cell carcinoma 01/2010   s/p right radical nephrectomy 01/2010,  followed by [facility]  . Shortness of breath 09/26/11   "lately all the time"   Past Surgical History:  Procedure Laterality Date  . APPENDECTOMY  1970's  . CARDIAC CATHETERIZATION  09/2011  . CHOLECYSTECTOMY  01/2010  . DIAGNOSTIC LAPAROSCOPY    . LEFT AND RIGHT HEART CATHETERIZATION WITH CORONARY ANGIOGRAM N/A 09/30/2011   Procedure: LEFT AND RIGHT HEART CATHETERIZATION WITH CORONARY ANGIOGRAM;  Surgeon: Candee Furbish, MD;  Location: [facility] CATH LAB;  Service: Cardiovascular;  Laterality: N/A;  .  MULTIPLE EXTRACTIONS WITH ALVEOLOPLASTY  10/06/2011   Procedure: MULTIPLE EXTRACION WITH ALVEOLOPLASTY;  Surgeon: Lenn Cal, DDS;  Location: [place];  Service: Oral Surgery;  Laterality: N/A;  Multiple extraction of tooth #'s 1, 6, 8, 10, 11, 22, 23, 26, 27, 28, 29 with alveoloplasty and Upper right buccal exostoses reductions.  . NEPHRECTOMY RADICAL  01/2010   right   . OVARIAN CYST REMOVAL  1970's   "went through belly button"  . TEE WITHOUT CARDIOVERSION  10/01/2011   Procedure: TRANSESOPHAGEAL ECHOCARDIOGRAM (TEE);  Surgeon: [name];  Location: [place];  Service: Cardiovascular;  Laterality: N/A;  . TUBAL LIGATION  1970's  . US ECHOCARDIOGRAPHY  09/2011    Family History  Problem Relation Age of Onset  . COPD Mother     DECEASED/SMOKED  . Diabetes Brother   . Diabetes Brother     SOCIAL HISTORY: Social History   Social History  . Marital status: Married    Spouse name: N/A  . Number of children: 3  . Years of education: 12   Occupational History  . [employer]   Social History Main Topics  . Smoking status: Current Every Day Smoker    Packs/day: 1.00    Years: 53.00    Types: Cigarettes  . Smokeless tobacco: Never Used  . Alcohol use 0.0 oz/week     Comment:  "drink very rarely"  . Drug use:  No     Comment: hx of cocaine last in 2006  . Sexual activity: No   Other Topics Concern  . Not on file   Social History Narrative   Lives in [place] with her husband.    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    Patient has 3 grown children that do not live with her.    Patient does not have any medical insurance.    Allergies  Allergen Reactions  . Ace Inhibitors     Pseudoasthma/ renal failure    Current Outpatient Prescriptions  Medication Sig Dispense Refill  . albuterol (PROVENTIL) (2.5 MG/3ML) 0.083% nebulizer solution Take 3 mLs (2.5 mg total)  by nebulization every 6 (six) hours as needed for wheezing or shortness of breath. 150 mL 1  . aspirin 81 MG tablet Take 81 mg by mouth daily.    Marland Kitchen atorvastatin (LIPITOR) 20 MG tablet Take 1 tablet (20 mg total) by mouth daily at 6 PM. 30 tablet 0  . budesonide-formoterol (SYMBICORT) 160-4.5 MCG/ACT inhaler Inhale 2 puffs into the lungs 2 (two) times daily. 1 Inhaler 3  . fenofibrate 160 MG tablet Take 1 tablet (160 mg total) by mouth daily. 30 tablet 0  . ipratropium-albuterol (DUONEB) 0.5-2.5 (3) MG/3ML SOLN Take 3 mLs by nebulization 3 (three) times daily as needed. 360 mL 0  . losartan (COZAAR) 100 MG tablet Take 1 tablet (100 mg total) by mouth daily. 30 tablet 5  . metoprolol tartrate (LOPRESSOR) 25 MG tablet Take 1 tablet (25 mg total) by mouth 2 (two) times daily. 60 tablet 0  . zolpidem (AMBIEN) 5 MG tablet Take 1 tablet (5 mg total) by mouth at bedtime as needed for sleep. 30 tablet 0   No current facility-administered medications for this visit.     ROS:   General:  No weight loss, Fever, chills  HEENT: No recent headaches, no nasal bleeding, no visual changes, no sore throat  Neurologic: No dizziness, blackouts, seizures. No recent symptoms of stroke or mini- stroke. No recent episodes of slurred speech, or temporary blindness.  Cardiac: No recent episodes of chest pain/pressure, no shortness of breath at rest.  No shortness of breath with exertion.  Denies history of atrial fibrillation or irregular heartbeat  Vascular: No history of rest pain in feet.  No history of claudication.  No history of non-healing ulcer, No history of DVT   Pulmonary: No home oxygen, no productive cough, no hemoptysis,  No asthma or wheezing  Musculoskeletal:  [ ]  Arthritis, [ ]  Low back pain,  [ ]  Joint pain  Hematologic:No history of hypercoagulable state.  No history of easy bleeding.  No history of anemia  Gastrointestinal: No hematochezia or melena,  No gastroesophageal reflux, no trouble  swallowing  Urinary: [X]  chronic Kidney disease, [ ]  on HD - [ ]  MWF or [ ]  TTHS, [ ]  Burning with urination, [ ]  Frequent urination, [ ]  Difficulty urinating;   Skin: No rashes  Psychological: No history of anxiety,  No history of depression   Physical Examination  Vitals:   06/04/16 1250  BP: 117/81  Pulse: 82  Resp: 18  Temp: 98.5 F (36.9 C)  TempSrc: Oral  SpO2: 97%  Weight: 137 lb (62.1 kg)  Height: 5\' 1"  (1.549 m)    General:  Alert and oriented, no acute distress HEENT: Normal Neck: No bruit or JVD Pulmonary: Bilateral scattered wheezes primarily anterior lung Katie [name]  Cardiac: Regular Rate  and Rhythm without murmur Abdomen: Soft, non-tender, non-distended, no mass, no scars Skin: No rash Extremity Pulses:  2+ radial, brachial, femoral, dorsalis pedis, posterior tibial pulses bilaterally Musculoskeletal: No deformity or edema  Neurologic: Upper and lower extremity motor 5/5 and symmetric  DATA:  The patient had bilateral ABIs performed on 05/26/2016. These were greater than 1 bilaterally normal and triphasic. She also had an arterial duplex exam which showed no significant focal stenosis.  She had a comprehensive metabolic panel performed yesterday which showed her creatinine was 1.8. It looks like her baseline is around 2.  ASSESSMENT:  Patient with lower extremity symptoms that are consistent with claudication. However she has had a normal ABI and lower extremity duplex exam. Clinically she does not have evidence of arterial occlusive disease. However, sometimes subtle iliac lesions can cause similar symptoms.   PLAN:  The patient will try to walk 30 minutes daily. She'll return for follow-up in 3 months time. At that time we will do ABIs with exercise. If her ABIs are normal with exercise and most likely her leg symptoms are due to another factor. If she does have a drop in her ABIs with ambulation we may pursue this further and look for a source of aortoiliac  occlusive disease. The patient has been able to quit smoking for the last 5 days. I congratulated her on this. Hopefully she'll be successful with this long-term.  She does have a history of renal insufficiency with a baseline creatinine of 2 and this would have to be taken into consideration of any contrast study is considered in the future.   Ruta Hinds, MD [facility] Office: [phone] Pager: [phone]

## 2016-06-05 ENCOUNTER — Telehealth: Payer: Self-pay | Admitting: Cardiovascular Disease

## 2016-06-05 ENCOUNTER — Telehealth: Payer: Self-pay | Admitting: *Deleted

## 2016-06-05 MED ORDER — IPRATROPIUM-ALBUTEROL 0.5-2.5 (3) MG/3ML IN SOLN: 3.0000 mL | Freq: Three times a day (TID) | RESPIRATORY_TRACT | 0 refills | Status: DC | PRN

## 2016-06-05 NOTE — Telephone Encounter (Signed)
-----   Message from Skeet Latch, MD sent at 06/05/2016  9:04 AM EDT ----- Kidney function is better than it has been recently.  Normal liver function and pancreas.  This does not explain her abdominal pain.

## 2016-06-05 NOTE — Telephone Encounter (Signed)
Spoke with patient and she stated Rx for Duoneb unable to be filled by [facility] and a new Rx needs to be sent with diagnosis code for insurance to cover  Sent as requested

## 2016-06-05 NOTE — Telephone Encounter (Signed)
New Message  Pharmacist voiced the code for the nebulizer is incorrect due to pt having medicare.  Please follow up with Pharmacist. Thanks!

## 2016-06-05 NOTE — Telephone Encounter (Signed)
Sent Rx for Duoneb with wheezing and cough Dx code

## 2016-06-06 ENCOUNTER — Other Ambulatory Visit: Payer: Self-pay | Admitting: Physician Assistant

## 2016-06-06 DIAGNOSIS — J4531 Mild persistent asthma with (acute) exacerbation: Secondary | ICD-10-CM

## 2016-06-06 MED ORDER — ALBUTEROL SULFATE (2.5 MG/3ML) 0.083% IN NEBU: 2.5000 mg | INHALATION_SOLUTION | Freq: Four times a day (QID) | RESPIRATORY_TRACT | 1 refills | Status: DC | PRN

## 2016-06-17 ENCOUNTER — Other Ambulatory Visit: Payer: Self-pay | Admitting: *Deleted

## 2016-06-17 DIAGNOSIS — I340 Nonrheumatic mitral (valve) insufficiency: Secondary | ICD-10-CM

## 2016-06-27 ENCOUNTER — Encounter (HOSPITAL_COMMUNITY): Payer: Self-pay

## 2016-06-27 ENCOUNTER — Encounter: Payer: Self-pay | Admitting: Vascular Surgery

## 2016-07-09 ENCOUNTER — Ambulatory Visit: Payer: Self-pay | Admitting: Cardiovascular Disease

## 2016-07-09 ENCOUNTER — Encounter: Payer: Self-pay | Admitting: *Deleted

## 2016-07-23 ENCOUNTER — Encounter (HOSPITAL_COMMUNITY): Payer: Self-pay

## 2016-07-23 ENCOUNTER — Encounter: Payer: Self-pay | Admitting: Vascular Surgery

## 2016-08-11 ENCOUNTER — Other Ambulatory Visit: Payer: Self-pay | Admitting: Physician Assistant

## 2016-08-11 NOTE — Telephone Encounter (Signed)
Rx request to pharmacy/SLS  

## 2016-08-12 ENCOUNTER — Other Ambulatory Visit: Payer: Self-pay

## 2016-08-12 ENCOUNTER — Ambulatory Visit: Payer: Medicare Other | Admitting: Physician Assistant

## 2016-08-12 MED ORDER — LOSARTAN POTASSIUM 100 MG PO TABS: 100.0000 mg | ORAL_TABLET | Freq: Every day | ORAL | 2 refills | Status: DC

## 2016-08-13 ENCOUNTER — Ambulatory Visit (INDEPENDENT_AMBULATORY_CARE_PROVIDER_SITE_OTHER): Payer: Medicare Other | Admitting: Physician Assistant

## 2016-08-13 VITALS — BP 140/88 | HR 66 | Temp 98.2°F | Resp 16 | Ht 61.0 in | Wt 139.5 lb

## 2016-08-13 DIAGNOSIS — I10 Essential (primary) hypertension: Secondary | ICD-10-CM

## 2016-08-13 DIAGNOSIS — F5101 Primary insomnia: Secondary | ICD-10-CM | POA: Diagnosis not present

## 2016-08-13 DIAGNOSIS — M79604 Pain in right leg: Secondary | ICD-10-CM | POA: Diagnosis not present

## 2016-08-13 MED ORDER — MELOXICAM 7.5 MG PO TABS: 7.5000 mg | ORAL_TABLET | Freq: Every day | ORAL | 0 refills | Status: DC

## 2016-08-13 MED ORDER — BUDESONIDE-FORMOTEROL FUMARATE 160-4.5 MCG/ACT IN AERO: 2.0000 | INHALATION_SPRAY | Freq: Two times a day (BID) | RESPIRATORY_TRACT | 1 refills | Status: DC

## 2016-08-13 MED ORDER — LOSARTAN POTASSIUM 100 MG PO TABS: 100.0000 mg | ORAL_TABLET | Freq: Every day | ORAL | 1 refills | Status: DC

## 2016-08-13 MED ORDER — FENOFIBRATE 160 MG PO TABS: 160.0000 mg | ORAL_TABLET | Freq: Every day | ORAL | 1 refills | Status: DC

## 2016-08-13 MED ORDER — METOPROLOL TARTRATE 25 MG PO TABS: 25.0000 mg | ORAL_TABLET | Freq: Two times a day (BID) | ORAL | 1 refills | Status: DC

## 2016-08-13 MED ORDER — ZOLPIDEM TARTRATE 5 MG PO TABS: 5.0000 mg | ORAL_TABLET | Freq: Every evening | ORAL | 0 refills | Status: DC | PRN

## 2016-08-13 MED ORDER — ATORVASTATIN CALCIUM 20 MG PO TABS: 20.0000 mg | ORAL_TABLET | Freq: Every day | ORAL | 1 refills | Status: DC

## 2016-08-13 MED ORDER — ALBUTEROL SULFATE HFA 108 (90 BASE) MCG/ACT IN AERS: 1.0000 | INHALATION_SPRAY | Freq: Four times a day (QID) | RESPIRATORY_TRACT | 1 refills | Status: DC | PRN

## 2016-08-13 NOTE — Patient Instructions (Signed)
Please go to the lab for blood work. I will call with your results.  Continue chronic medications as directed.  For the knee/leg -- apply heating pad to the area in 10-minute intervals a few times per day. Start a glucosamine-chondroitin supplement. Take Mobic as directed for max of 10 days. Take with food. If symptoms are not resolving, please let me know. We will need to consider imaging and/or a Sports Medicine assessment.

## 2016-08-13 NOTE — Progress Notes (Signed)
Pre visit review using our clinic review tool, if applicable. No additional management support is needed unless otherwise documented below in the visit note/SLS  

## 2016-08-13 NOTE — Progress Notes (Signed)
Patient presents to clinic today c/o for follow-up of Hypertension and to discuss acute concern.  Hypertension -- Patient is currently on regimen of losartan 100 mg QD and metoprolol tartrate 25 mg BID. Endorses taking medications as directed. Patient denies chest pain, palpitations, lightheadedness, dizziness, vision changes or frequent headaches.  BP Readings from Last 3 Encounters:  08/13/16 140/88  06/04/16 117/81  06/03/16 115/80   Patient endorses some R anterior and lateral knee pain associated with lateral thigh pain over the past week. Pain is intermittent and worse with ambulation. Denies trauma or injury. Denies numbness, tingling or weakness.  Past Medical History:  Diagnosis Date  . Arthritis   . Cholelithiasis 2011   s/p cholescystectomy   . COPD (chronic obstructive pulmonary disease) (HCC)    no history of PFTs, only diagnosed by CXR, not on any inhalers because she has not had a PCP in over 2 years  . Emphysema 2010   dx'd/CXR  . GERD (gastroesophageal reflux disease)   . History of heartburn   . Hypercholesteremia    previously on pravastatin   . Hypertension    previously on Lisinopril/HCTZ  . Murmur    no  prior work-up  . Pneumonia 1980's   "walking"  . Renal cell carcinoma 01/2010   s/p right radical nephrectomy 01/2010,  [facility]  . Shortness of breath 09/26/11   "lately all the time"    Current Outpatient Prescriptions on File Prior to Visit  Medication Sig Dispense Refill  . albuterol (PROVENTIL) (2.5 MG/3ML) 0.083% nebulizer solution Take 3 mLs (2.5 mg total) by nebulization every 6 (six) hours as needed for wheezing or shortness of breath. 150 mL 1   No current facility-administered medications on file prior to visit.     Allergies  Allergen Reactions  . Ace Inhibitors     Pseudoasthma/ renal failure    Family History  Problem Relation Age of Onset  . COPD Mother     DECEASED/SMOKED  . Diabetes Brother   .  Diabetes Brother     Social History   Social History  . Marital status: Married    Spouse name: N/A  . Number of children: 3  . Years of education: 12   Occupational History  . [employer]   Social History Main Topics  . Smoking status: Current Every Day Smoker    Packs/day: 1.00    Years: 53.00    Types: Cigarettes  . Smokeless tobacco: Never Used     Comment: Stopped 05-31-16  . Alcohol use 0.0 oz/week     Comment:  "drink very rarely"  . Drug use: No     Comment: hx of cocaine last in 2006  . Sexual activity: No   Other Topics Concern  . Not on file   Social History Narrative   Lives in [place] with her husband.    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    Patient has 3 grown children that do not live with her.    Patient does not have any medical insurance.    Review of Systems - See HPI.  All other ROS are negative.  BP 140/88 (BP Location: Right Arm, Patient Position: Sitting, Cuff Size: Large)   Pulse 66   Temp 98.2 F (36.8 C) (Oral)   Resp 16   Ht 5\' 1"  (1.549 m)   Wt 139 lb 8 oz (63.3  kg)   SpO2 99%   BMI 26.36 kg/m   Physical Exam  Constitutional: She is well-developed, well-nourished, and in no distress.  HENT:  Head: Normocephalic and atraumatic.  Eyes: Conjunctivae are normal.  Neck: Neck supple.  Cardiovascular: Normal rate, regular rhythm, normal heart sounds and intact distal pulses.   Pulmonary/Chest: Effort normal and breath sounds normal. No respiratory distress. She has no wheezes. She has no rales. She exhibits no tenderness.  Musculoskeletal:       Right knee: She exhibits normal range of motion, no swelling and no bony tenderness. No medial joint line, no lateral joint line and no patellar tendon tenderness noted.       Legs: Vitals reviewed.   Recent Results (from the past 2160 hour(s))  CBC w/Diff     Status: None   Collection Time:  05/26/16 10:23 AM  Result Value Ref Range   WBC 7.6 4.0 - 10.5 K/uL   RBC 4.21 3.87 - 5.11 Mil/uL   Hemoglobin 12.3 12.0 - 15.0 g/dL   HCT 37.0 36.0 - 46.0 %   MCV 87.9 78.0 - 100.0 fl   MCHC 33.2 30.0 - 36.0 g/dL   RDW 14.0 11.5 - 15.5 %   Platelets 340.0 150.0 - 400.0 K/uL   Neutrophils Relative % 72.4 43.0 - 77.0 %   Lymphocytes Relative 18.1 12.0 - 46.0 %   Monocytes Relative 8.8 3.0 - 12.0 %   Eosinophils Relative 0.2 0.0 - 5.0 %   Basophils Relative 0.5 0.0 - 3.0 %   Neutro Abs 5.5 1.4 - 7.7 K/uL   Lymphs Abs 1.4 0.7 - 4.0 K/uL   Monocytes Absolute 0.7 0.1 - 1.0 K/uL   Eosinophils Absolute 0.0 0.0 - 0.7 K/uL   Basophils Absolute 0.0 0.0 - 0.1 K/uL  Comprehensive metabolic panel     Status: Abnormal   Collection Time: 06/03/16  4:31 PM  Result Value Ref Range   Sodium 139 135 - 146 mmol/L   Potassium 4.6 3.5 - 5.3 mmol/L   Chloride 101 98 - 110 mmol/L   CO2 31 20 - 31 mmol/L   Glucose, Bld 102 (H) 65 - 99 mg/dL   BUN 21 7 - 25 mg/dL   Creat 1.81 (H) 0.50 - 0.99 mg/dL    Comment:   For patients > or = 68 years of age: The upper reference limit for Creatinine is approximately 13% higher for people identified as African-American.      Total Bilirubin 0.5 0.2 - 1.2 mg/dL   Alkaline Phosphatase 64 33 - 130 U/L   AST 15 10 - 35 U/L   ALT 12 6 - 29 U/L   Total Protein 7.2 6.1 - 8.1 g/dL   Albumin 4.1 3.6 - 5.1 g/dL   Calcium 10.9 (H) 8.6 - 10.4 mg/dL  Amylase     Status: None   Collection Time: 06/03/16  4:31 PM  Result Value Ref Range   Amylase 59 0 - 105 U/L  Lipase     Status: None   Collection Time: 06/03/16  4:31 PM  Result Value Ref Range   Lipase 56 7 - 60 U/L  Basic metabolic panel     Status: Abnormal   Collection Time: 08/13/16  3:22 PM  Result Value Ref Range   Sodium 139 135 - 145 mEq/L   Potassium 3.9 3.5 - 5.1 mEq/L   Chloride 104 96 - 112 mEq/L   CO2 28 19 - 32 mEq/L   Glucose, Bld  99 70 - 99 mg/dL   BUN 24 (H) 6 - 23 mg/dL   Creatinine, Ser  1.30 (H) 0.40 - 1.20 mg/dL   Calcium 9.6 8.4 - 10.5 mg/dL   GFR 43.22 (L) >60.00 mL/min    Assessment/Plan: 1. Primary insomnia Ambien refilled. Continue as directed. - zolpidem (AMBIEN) 5 MG tablet; Take 1 tablet (5 mg total) by mouth at bedtime as needed for sleep.  Dispense: 30 tablet; Refill: 0  2. Essential hypertension BP improved on recheck. Increase likely due to pain. BP previously well-controlled on current regimen. Will check BMP. Continue medications as directed. - Basic metabolic panel  3. Right leg pain Muscular in nature. Discussed supportive measures. Will attempt trial of low-dose NSAID to help with inflammation. If not resolving, will refer to Sports Medicine.   [name], PA-C

## 2016-08-14 ENCOUNTER — Telehealth: Payer: Self-pay | Admitting: Physician Assistant

## 2016-08-14 ENCOUNTER — Encounter: Payer: Self-pay | Admitting: Physician Assistant

## 2016-08-14 LAB — BASIC METABOLIC PANEL
BUN: 24 mg/dL — ABNORMAL HIGH (ref 6–23)
CALCIUM: 9.6 mg/dL (ref 8.4–10.5)
CO2: 28 meq/L (ref 19–32)
Chloride: 104 mEq/L (ref 96–112)
Creatinine, Ser: 1.3 mg/dL — ABNORMAL HIGH (ref 0.40–1.20)
GFR: 43.22 mL/min — ABNORMAL LOW (ref >60.00)
Glucose, Bld: 99 mg/dL (ref 70–99)
Potassium: 3.9 mEq/L (ref 3.5–5.1)
SODIUM: 139 meq/L (ref 135–145)

## 2016-08-14 NOTE — Progress Notes (Signed)
Left message on voicemail for pt to call the office PC

## 2016-08-14 NOTE — Telephone Encounter (Signed)
Patient is calling regarding her lab results. Please advise.   Phone: [phone]

## 2016-08-15 NOTE — Telephone Encounter (Signed)
Notes Recorded by Magdalene Molly, RMA on 08/15/2016 at 11:52 AM EDT Patient has been notified and voices understanding PC ------

## 2016-08-22 DIAGNOSIS — Z23 Encounter for immunization: Secondary | ICD-10-CM | POA: Diagnosis not present

## 2016-09-03 ENCOUNTER — Encounter: Payer: Self-pay | Admitting: Vascular Surgery

## 2016-09-04 ENCOUNTER — Ambulatory Visit: Payer: Self-pay | Admitting: Vascular Surgery

## 2016-09-04 ENCOUNTER — Inpatient Hospital Stay (HOSPITAL_COMMUNITY): Admission: RE | Admit: 2016-09-04 | Payer: Self-pay | Source: Ambulatory Visit

## 2016-10-22 ENCOUNTER — Telehealth: Payer: Self-pay | Admitting: Physician Assistant

## 2016-10-22 ENCOUNTER — Ambulatory Visit (INDEPENDENT_AMBULATORY_CARE_PROVIDER_SITE_OTHER): Payer: Medicare Other | Admitting: Family Medicine

## 2016-10-22 DIAGNOSIS — Z0289 Encounter for other administrative examinations: Secondary | ICD-10-CM

## 2016-10-22 NOTE — Telephone Encounter (Signed)
Pt lvm at 12:19 cancelling her appt. Pt didn't give a reason why.    Should pt be charged?

## 2016-10-22 NOTE — Telephone Encounter (Signed)
Patient scheduled for 10/22/16.

## 2016-10-22 NOTE — Telephone Encounter (Signed)
Patient would like to transfer from [place] to Dr. Copland, please advise °

## 2016-10-22 NOTE — Telephone Encounter (Signed)
Fine with me

## 2016-10-22 NOTE — Telephone Encounter (Signed)
ok 

## 2016-10-23 NOTE — Telephone Encounter (Signed)
LM for pt to see if she wants to have the testing done. Please advise Percell Miller as it will need reordered if pt does want testing.

## 2016-10-23 NOTE — Telephone Encounter (Signed)
In 2016 I had put order in for ct of chest screening due to her smoking history. Looks like she never got the test done. Looks like she is switching to Dr. Lorelei Pont. Will you call pt and see if she will get. Do I need to reorder since has been such a long time. If pt expresses she does not want test done then document. And explain she could discuss further with Dr. Lorelei Pont when she meets her.

## 2016-10-24 NOTE — Telephone Encounter (Signed)
Charge please 

## 2016-10-27 ENCOUNTER — Encounter: Payer: Self-pay | Admitting: Physician Assistant

## 2016-11-10 ENCOUNTER — Ambulatory Visit (INDEPENDENT_AMBULATORY_CARE_PROVIDER_SITE_OTHER): Payer: Medicare Other | Admitting: Physician Assistant

## 2016-11-10 ENCOUNTER — Encounter: Payer: Self-pay | Admitting: Physician Assistant

## 2016-11-10 VITALS — BP 118/80 | HR 90 | Temp 98.0°F | Resp 16 | Ht 61.0 in | Wt 138.0 lb

## 2016-11-10 DIAGNOSIS — Z720 Tobacco use: Secondary | ICD-10-CM

## 2016-11-10 DIAGNOSIS — Z780 Asymptomatic menopausal state: Secondary | ICD-10-CM

## 2016-11-10 DIAGNOSIS — F5101 Primary insomnia: Secondary | ICD-10-CM | POA: Diagnosis not present

## 2016-11-10 DIAGNOSIS — E785 Hyperlipidemia, unspecified: Secondary | ICD-10-CM

## 2016-11-10 DIAGNOSIS — Z1231 Encounter for screening mammogram for malignant neoplasm of breast: Secondary | ICD-10-CM

## 2016-11-10 DIAGNOSIS — J441 Chronic obstructive pulmonary disease with (acute) exacerbation: Secondary | ICD-10-CM | POA: Diagnosis not present

## 2016-11-10 DIAGNOSIS — M25541 Pain in joints of right hand: Secondary | ICD-10-CM

## 2016-11-10 DIAGNOSIS — M25542 Pain in joints of left hand: Secondary | ICD-10-CM | POA: Diagnosis not present

## 2016-11-10 DIAGNOSIS — Z1239 Encounter for other screening for malignant neoplasm of breast: Secondary | ICD-10-CM

## 2016-11-10 LAB — COMPREHENSIVE METABOLIC PANEL
ALBUMIN: 4.2 g/dL (ref 3.5–5.2)
ALT: 20 U/L (ref 0–35)
AST: 21 U/L (ref 0–37)
Alkaline Phosphatase: 92 U/L (ref 39–117)
BILIRUBIN TOTAL: 0.5 mg/dL (ref 0.2–1.2)
BUN: 18 mg/dL (ref 6–23)
CALCIUM: 10 mg/dL (ref 8.4–10.5)
CO2: 29 mEq/L (ref 19–32)
CREATININE: 1.15 mg/dL (ref 0.40–1.20)
Chloride: 102 mEq/L (ref 96–112)
GFR: 49.75 mL/min — AB (ref >60.00)
Glucose, Bld: 81 mg/dL (ref 70–99)
Potassium: 4.4 mEq/L (ref 3.5–5.1)
Sodium: 137 mEq/L (ref 135–145)
Total Protein: 7.6 g/dL (ref 6.0–8.3)

## 2016-11-10 LAB — LIPID PANEL
CHOL/HDL RATIO: 5.1 ratio — AB (ref <5.0)
Cholesterol: 327 mg/dL — ABNORMAL HIGH (ref <200)
HDL: 64 mg/dL (ref >50)
LDL Cholesterol: 199 mg/dL — ABNORMAL HIGH (ref <100)
TRIGLYCERIDES: 322 mg/dL — AB (ref <150)
VLDL: 64 mg/dL — ABNORMAL HIGH (ref <30)

## 2016-11-10 MED ORDER — IPRATROPIUM-ALBUTEROL 0.5-2.5 (3) MG/3ML IN SOLN
3.0000 mL | Freq: Once | RESPIRATORY_TRACT | Status: AC
  Administered 2016-11-10: 3 mL via RESPIRATORY_TRACT

## 2016-11-10 MED ORDER — SUVOREXANT 10 MG PO TABS: 10.0000 mg | ORAL_TABLET | Freq: Every day | ORAL | 0 refills | Status: DC

## 2016-11-10 MED ORDER — PREDNISONE 20 MG PO TABS: 40.0000 mg | ORAL_TABLET | Freq: Every day | ORAL | 0 refills | Status: DC

## 2016-11-10 MED ORDER — DOXYCYCLINE HYCLATE 100 MG PO CAPS: 100.0000 mg | ORAL_CAPSULE | Freq: Two times a day (BID) | ORAL | 0 refills | Status: DC

## 2016-11-10 NOTE — Patient Instructions (Signed)
Please go to the lab for blood work. I will call you with your results.  Make sure to keep well hydrated.  Continue chronic COPD medications. Take prednisone and antibiotic as directed. Use Delsym for cough. Follow-up if not improving within 48 hours.   Stop the Ambien. We will start a trial of the Belsomra. Call me in 1 week and let me know how you are doing on this medication.

## 2016-11-10 NOTE — Progress Notes (Signed)
Patient presents to clinic today for follow-up. Patient also with acute concerns today.  Hyperlipidemia -- with elevated TGL. Is overdue for recheck. Is currently on Atorvastatin 20 mg daily and Fenofibrate 160 mg daily. Endorses taking as directed without side effect. Is fasting for labs today.   Insomnia -- Patient currently on ambien taking PRN for sleep. Endorses only averaging about 3 hours of sleep on this medication. Is keeping TV on while sleeping. Denies daytime napping. Is feeling sluggish throughout her day due to lack of sleep.  Mammogram -- Last over 2 years ago. Declines screening mammogram.  DEXA -- Overdue. Agrees to screening.   Patient also c/o 1 week of increased sputum production, now yellow in color, associated with chest tightness and wheezing. Denies fever, chills, chest pain. Is using chronic COPD medications as directed. Is still smoking daily. Has tried to quit previously -- failed Wellbutrin and had multiple side effects with Chantix. Would like to attempt Nicoderm patches.  Past Medical History:  Diagnosis Date  . Arthritis   . Cholelithiasis 2011   s/p cholescystectomy   . COPD (chronic obstructive pulmonary disease) (HCC)    no history of PFTs, only diagnosed by CXR, not on any inhalers because she has not had a PCP in over 2 years  . Emphysema 2010   dx'd/CXR  . GERD (gastroesophageal reflux disease)   . History of heartburn   . Hypercholesteremia    previously on pravastatin   . Hypertension    previously on Lisinopril/HCTZ  . Murmur    no  prior work-up  . Pneumonia 1980's   "walking"  . Renal cell carcinoma 01/2010   s/p right radical nephrectomy 01/2010,  [facility]  . Shortness of breath 09/26/11   "lately all the time"    Current Outpatient Prescriptions on File Prior to Visit  Medication Sig Dispense Refill  . albuterol (PROVENTIL HFA;VENTOLIN HFA) 108 (90 Base) MCG/ACT inhaler Inhale 1-2 puffs into the lungs every 6  (six) hours as needed for wheezing or shortness of breath. 3 Inhaler 1  . albuterol (PROVENTIL) (2.5 MG/3ML) 0.083% nebulizer solution Take 3 mLs (2.5 mg total) by nebulization every 6 (six) hours as needed for wheezing or shortness of breath. 150 mL 1  . atorvastatin (LIPITOR) 20 MG tablet Take 1 tablet (20 mg total) by mouth daily at 6 PM. 90 tablet 1  . budesonide-formoterol (SYMBICORT) 160-4.5 MCG/ACT inhaler Inhale 2 puffs into the lungs 2 (two) times daily. 3 Inhaler 1  . fenofibrate 160 MG tablet Take 1 tablet (160 mg total) by mouth daily. 90 tablet 1  . losartan (COZAAR) 100 MG tablet Take 1 tablet (100 mg total) by mouth daily. 90 tablet 1  . metoprolol tartrate (LOPRESSOR) 25 MG tablet Take 1 tablet (25 mg total) by mouth 2 (two) times daily. 180 tablet 1   No current facility-administered medications on file prior to visit.     Allergies  Allergen Reactions  . Ace Inhibitors     Pseudoasthma/ renal failure    Family History  Problem Relation Age of Onset  . COPD Mother     DECEASED/SMOKED  . Diabetes Brother   . Diabetes Brother     Social History   Social History  . Marital status: Married    Spouse name: N/A  . Number of children: 3  . Years of education: 12   Occupational History  . [employer]  Social History Main Topics  . Smoking status: Current Every Day Smoker    Packs/day: 1.00    Years: 53.00    Types: Cigarettes  . Smokeless tobacco: Never Used     Comment: Stopped 05-31-16  . Alcohol use 0.0 oz/week     Comment:  "drink very rarely"  . Drug use: No     Comment: hx of cocaine last in 2006  . Sexual activity: No   Other Topics Concern  . None   Social History Narrative   Lives in [place] with her husband.    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    Patient has 3 grown children that do not live with her.    Patient does not have any medical  insurance.   Review of Systems - See HPI.  All other ROS are negative.  BP 118/80   Pulse 90   Temp 98 F (36.7 C) (Oral)   Resp 16   Ht _0  (1.549 m)   Wt 138 lb (62.6 kg)   SpO2 97%   BMI 26.07 kg/m   Physical Exam  Constitutional: She is oriented to person, place, and time and well-developed, well-nourished, and in no distress.  HENT:  Head: Normocephalic and atraumatic.  Right Ear: External ear normal.  Left Ear: External ear normal.  Nose: Nose normal.  Mouth/Throat: Oropharynx is clear and moist. No oropharyngeal exudate.  TM within normal limits  Eyes: Conjunctivae are normal.  Neck: Neck supple.  Cardiovascular: Normal rate, regular rhythm, normal heart sounds and intact distal pulses.   Pulmonary/Chest: No respiratory distress. She has wheezes. She has no rales. She exhibits no tenderness.  Musculoskeletal:       Left hand: She exhibits no deformity and no swelling. Normal sensation noted. Normal strength noted.  Neurological: She is alert and oriented to person, place, and time.  Skin: Skin is warm and dry. No rash noted.  Psychiatric: Affect normal.  Vitals reviewed.   Recent Results (from the past 2160 hour(s))  Comp Met (CMET)     Status: Abnormal   Collection Time: 11/10/16  9:53 AM  Result Value Ref Range   Sodium 137 135 - 145 mEq/L   Potassium 4.4 3.5 - 5.1 mEq/L   Chloride 102 96 - 112 mEq/L   CO2 29 19 - 32 mEq/L   Glucose, Bld 81 70 - 99 mg/dL   BUN 18 6 - 23 mg/dL   Creatinine, Ser 1.15 0.40 - 1.20 mg/dL   Total Bilirubin 0.5 0.2 - 1.2 mg/dL   Alkaline Phosphatase 92 39 - 117 U/L   AST 21 0 - 37 U/L   ALT 20 0 - 35 U/L   Total Protein 7.6 6.0 - 8.3 g/dL   Albumin 4.2 3.5 - 5.2 g/dL   Calcium 10.0 8.4 - 10.5 mg/dL   GFR 49.75 (L) >60.00 mL/min  Lipid panel     Status: Abnormal   Collection Time: 11/10/16  9:53 AM  Result Value Ref Range   Cholesterol 327 (H) <200 mg/dL   Triglycerides 322 (H) <150 mg/dL   HDL 64 >50 mg/dL   Total  CHOL/HDL Ratio 5.1 (H) <5.0 Ratio   VLDL 64 (H) <30 mg/dL   LDL Cholesterol 199 (H) <100 mg/dL  Rheumatoid Factor     Status: Abnormal   Collection Time: 11/10/16  9:53 AM  Result Value Ref Range   Rhuematoid fact SerPl-aCnc 14 (H) <14 IU/mL   Assessment/Plan:  COPD exacerbation ([place]) Will start prednisone burst. Continue chronic medications. Increase hydration. Humidifier in bedroom. Will also start ABX today. Strict return precautions reviewed with patient.   Tobacco abuse disorder Ready to quit. Will start OTC Nicoderm patches due to failure of medications previously. Will follow.  Insomnia Sleep hygiene reviewed for patient to begin implementing. Will stop Ambien. Will attempt trial of Belsomra. FU discussed.  HLD (hyperlipidemia) Will obtain fasting lipid panel today to reassess. Will alter regimen accordingly.  Arthralgia of both hands Will check RA labs today giving symptoms. Will refer to Rheumatology for further treatment pending results giving renal history.  Postmenopausal estrogen deficiency Order for DEXA placed.  Breast cancer screening Patient refuses mammogram and breast examination AMA    [name], PA-C

## 2016-11-10 NOTE — Progress Notes (Signed)
Pre visit review using our clinic review tool, if applicable. No additional management support is needed unless otherwise documented below in the visit note. 

## 2016-11-11 DIAGNOSIS — M25541 Pain in joints of right hand: Secondary | ICD-10-CM | POA: Insufficient documentation

## 2016-11-11 DIAGNOSIS — Z780 Asymptomatic menopausal state: Secondary | ICD-10-CM | POA: Insufficient documentation

## 2016-11-11 DIAGNOSIS — J441 Chronic obstructive pulmonary disease with (acute) exacerbation: Secondary | ICD-10-CM | POA: Insufficient documentation

## 2016-11-11 DIAGNOSIS — Z1239 Encounter for other screening for malignant neoplasm of breast: Secondary | ICD-10-CM | POA: Insufficient documentation

## 2016-11-11 DIAGNOSIS — M25542 Pain in joints of left hand: Secondary | ICD-10-CM

## 2016-11-11 LAB — RHEUMATOID FACTOR: Rhuematoid fact SerPl-aCnc: 14 IU/mL — ABNORMAL HIGH (ref <14)

## 2016-11-11 NOTE — Assessment & Plan Note (Signed)
Sleep hygiene reviewed for patient to begin implementing. Will stop Ambien. Will attempt trial of Belsomra. FU discussed.

## 2016-11-11 NOTE — Assessment & Plan Note (Signed)
Order for DEXA placed.

## 2016-11-11 NOTE — Assessment & Plan Note (Signed)
Patient refuses mammogram and breast examination AMA

## 2016-11-11 NOTE — Assessment & Plan Note (Signed)
Ready to quit. Will start OTC Nicoderm patches due to failure of medications previously. Will follow.

## 2016-11-11 NOTE — Assessment & Plan Note (Signed)
Will start prednisone burst. Continue chronic medications. Increase hydration. Humidifier in bedroom. Will also start ABX today. Strict return precautions reviewed with patient.

## 2016-11-11 NOTE — Assessment & Plan Note (Signed)
Will check RA labs today giving symptoms. Will refer to Rheumatology for further treatment pending results giving renal history.

## 2016-11-11 NOTE — Assessment & Plan Note (Signed)
Will obtain fasting lipid panel today to reassess. Will alter regimen accordingly.

## 2016-11-13 ENCOUNTER — Other Ambulatory Visit: Payer: Self-pay | Admitting: Physician Assistant

## 2016-11-13 DIAGNOSIS — M2550 Pain in unspecified joint: Secondary | ICD-10-CM

## 2016-11-13 DIAGNOSIS — E782 Mixed hyperlipidemia: Secondary | ICD-10-CM

## 2016-11-13 MED ORDER — ATORVASTATIN CALCIUM 40 MG PO TABS: 40.0000 mg | ORAL_TABLET | Freq: Every day | ORAL | 0 refills | Status: DC

## 2016-11-24 ENCOUNTER — Other Ambulatory Visit: Payer: Self-pay | Admitting: Physician Assistant

## 2016-11-24 DIAGNOSIS — E2839 Other primary ovarian failure: Secondary | ICD-10-CM

## 2016-12-01 ENCOUNTER — Ambulatory Visit (HOSPITAL_BASED_OUTPATIENT_CLINIC_OR_DEPARTMENT_OTHER)
Admission: RE | Admit: 2016-12-01 | Discharge: 2016-12-01 | Disposition: A | Discharge status: Home / Self Care | Payer: Medicare Other | Source: Ambulatory Visit | Attending: Physician Assistant | Admitting: Physician Assistant

## 2016-12-01 DIAGNOSIS — E2839 Other primary ovarian failure: Secondary | ICD-10-CM | POA: Diagnosis not present

## 2016-12-01 DIAGNOSIS — M810 Age-related osteoporosis without current pathological fracture: Secondary | ICD-10-CM | POA: Insufficient documentation

## 2016-12-03 ENCOUNTER — Ambulatory Visit: Payer: Self-pay | Admitting: Physician Assistant

## 2016-12-05 ENCOUNTER — Telehealth: Payer: Self-pay | Admitting: Physician Assistant

## 2016-12-05 NOTE — Telephone Encounter (Signed)
Patient advised and scheduled for an appointment on 12/08/16.

## 2016-12-05 NOTE — Telephone Encounter (Signed)
Spoke with patient and she states she had headache last night, took some aspirin and her headache did improve some. Her blood pressure was 180/? After taking her medication this morning she rechecked her bp was 147/90 and later was 149/99. Advised she needed an appointment

## 2016-12-05 NOTE — Telephone Encounter (Signed)
Patient is on blood pressure medicine but her bp is very high still when she checks.   Patient says she also has a headache so she wants to know if PA Hassell Done will prescribe something else or if she needs to come in to be seen.  Thank you,  -LL

## 2016-12-05 NOTE — Telephone Encounter (Signed)
She definitely needs to schedule. She was scheduled this week but cancelled.

## 2016-12-08 ENCOUNTER — Ambulatory Visit: Payer: Self-pay | Admitting: Physician Assistant

## 2016-12-08 DIAGNOSIS — Z0289 Encounter for other administrative examinations: Secondary | ICD-10-CM

## 2016-12-31 ENCOUNTER — Other Ambulatory Visit: Payer: Self-pay | Admitting: Physician Assistant

## 2016-12-31 ENCOUNTER — Encounter: Payer: Self-pay | Admitting: Emergency Medicine

## 2016-12-31 ENCOUNTER — Telehealth: Payer: Self-pay | Admitting: Emergency Medicine

## 2016-12-31 DIAGNOSIS — N179 Acute kidney failure, unspecified: Secondary | ICD-10-CM

## 2016-12-31 DIAGNOSIS — Z85528 Personal history of other malignant neoplasm of kidney: Secondary | ICD-10-CM

## 2016-12-31 DIAGNOSIS — N183 Chronic kidney disease, stage 3 (moderate): Secondary | ICD-10-CM

## 2016-12-31 DIAGNOSIS — M810 Age-related osteoporosis without current pathological fracture: Secondary | ICD-10-CM | POA: Insufficient documentation

## 2016-12-31 NOTE — Telephone Encounter (Signed)
Faxed [employer] verification form. Waiting on response.

## 2016-12-31 NOTE — Progress Notes (Deleted)
Office Visit Note  Patient: Katie [name]             Date of Birth: June 19, 1946           MRN: [id number]             PCP: [name], PA-C Referring: Brunetta Jeans, PA-C Visit Date: 01/02/2017 Occupation: @GUAROCC @    Subjective:  No chief complaint on file.   History of Present Illness: Katie [name] is a 71 y.o. female ***   Activities of Daily Living:  Patient reports morning stiffness for *** {minute/hour:19697}.   Patient {ACTIONS;DENIES/REPORTS:21021675::"Denies"} nocturnal pain.  Difficulty dressing/grooming: {ACTIONS;DENIES/REPORTS:21021675::"Denies"} Difficulty climbing stairs: {ACTIONS;DENIES/REPORTS:21021675::"Denies"} Difficulty getting out of chair: {ACTIONS;DENIES/REPORTS:21021675::"Denies"} Difficulty using hands for taps, buttons, cutlery, and/or writing: {ACTIONS;DENIES/REPORTS:21021675::"Denies"}   No Rheumatology ROS completed.   PMFS History:  Patient Active Problem List   Diagnosis Date Noted  . Osteoporosis 12/31/2016  . Arthralgia of both hands 11/11/2016  . COPD exacerbation ([place]) 11/11/2016  . Postmenopausal estrogen deficiency 11/11/2016  . Breast cancer screening 11/11/2016  . Hyperglycemia 08/03/2015  . Esophageal reflux   . Depression with anxiety   . Tobacco abuse disorder 06/27/2014  . Insomnia 07/15/2013  . Mitral regurgitation due to cusp prolapse 09/29/2011  . COPD GOLD O  09/26/2011  . Essential hypertension 09/26/2011  . HLD (hyperlipidemia) 09/26/2011  . S/P cholecystectomy 09/26/2011  . Murmur 09/26/2011    Past Medical History:  Diagnosis Date  . Arthritis   . Cholelithiasis 2011   s/p cholescystectomy   . COPD (chronic obstructive pulmonary disease) (HCC)    no history of PFTs, only diagnosed by CXR, not on any inhalers because she has not had a PCP in over 2 years  . Emphysema 2010   dx'd/CXR  . GERD (gastroesophageal reflux disease)   . History of heartburn   . History of renal cell carcinoma     . Hypercholesteremia    previously on pravastatin   . Hypertension    previously on Lisinopril/HCTZ  . Murmur    no  prior work-up  . Pneumonia 1980's   "walking"  . Renal cell carcinoma 01/2010   s/p right radical nephrectomy 01/2010,  [facility]  . Shortness of breath 09/26/11   "lately all the time"    Family History  Problem Relation Age of Onset  . COPD Mother     DECEASED/SMOKED  . Diabetes Brother   . Diabetes Brother    Past Surgical History:  Procedure Laterality Date  . APPENDECTOMY  1970's  . CARDIAC CATHETERIZATION  09/2011  . CHOLECYSTECTOMY  01/2010  . DIAGNOSTIC LAPAROSCOPY    . LEFT AND RIGHT HEART CATHETERIZATION WITH CORONARY ANGIOGRAM N/A 09/30/2011   Procedure: LEFT AND RIGHT HEART CATHETERIZATION WITH CORONARY ANGIOGRAM;  Surgeon: Candee Furbish, MD;  Location: [facility] CATH LAB;  Service: Cardiovascular;  Laterality: N/A;  . MULTIPLE EXTRACTIONS WITH ALVEOLOPLASTY  10/06/2011   Procedure: MULTIPLE EXTRACION WITH ALVEOLOPLASTY;  Surgeon: Lenn Cal, DDS;  Location: [place];  Service: Oral Surgery;  Laterality: N/A;  Multiple extraction of tooth #'s 1, 6, 8, 10, 11, 22, 23, 26, 27, 28, 29 with alveoloplasty and Upper right buccal exostoses reductions.  . NEPHRECTOMY RADICAL  01/2010   right   . OVARIAN CYST REMOVAL  1970's   "went through belly button"  . TEE WITHOUT CARDIOVERSION  10/01/2011   Procedure: TRANSESOPHAGEAL ECHOCARDIOGRAM (TEE);  Surgeon: [name];  Location: [place];  Service: Cardiovascular;  Laterality: N/A;  .  TUBAL LIGATION  1970's  . US ECHOCARDIOGRAPHY  09/2011   Social History   Social History Narrative   Lives in [place] with her husband.    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    Patient has 3 grown children that do not live with her.    Patient does not have any medical insurance.     Objective: Vital Signs: There were no vitals taken for this visit.    Physical Exam   Musculoskeletal Exam: ***  CDAI Exam: No CDAI exam completed.    Investigation: Findings:  August 2017 CMP creatinine 1.81, CBC normal, amylase normal, lipase normal 11/10/2016 BMP creatinine 1.3 GFR 43.22, LDL 199, triglyceride 322, rheumatoid factor 14 (normal less than 14)    Imaging: No results found.  Speciality Comments: No specialty comments available.    Procedures:  No procedures performed Allergies: Ace inhibitors   Assessment / Plan:     Visit Diagnoses: Arthralgia of both hands - RF 14  Essential hypertension  Mixed hyperlipidemia  COPD   Tobacco abuse disorder  Mitral valve insufficiency  Gastroesophageal reflux disease without esophagitis  Depression with anxiety  Osteoporosis    Orders: No orders of the defined types were placed in this encounter.  No orders of the defined types were placed in this encounter.   Face-to-face time spent with patient was *** minutes. 50% of time was spent in counseling and coordination of care.  Follow-Up Instructions: No Follow-up on file.   Bo Merino, MD  Note - This record has been created using Editor, commissioning.  Chart creation errors have been sought, but may not always  have been located. Such creation errors do not reflect on  the standard of medical care.

## 2017-01-02 ENCOUNTER — Ambulatory Visit: Payer: Self-pay | Admitting: Rheumatology

## 2017-02-06 ENCOUNTER — Ambulatory Visit: Payer: Self-pay | Admitting: Rheumatology

## 2017-03-06 ENCOUNTER — Telehealth: Payer: Self-pay | Admitting: Physician Assistant

## 2017-03-06 NOTE — Telephone Encounter (Signed)
Pt asking if prednisone could be called into [facility] on [address], pt has made an appt for Monday.

## 2017-03-06 NOTE — Telephone Encounter (Signed)
Spoke with patient about the reason for the Prednisone. Patient states her breathing has worsened. She couldn't be seen today due to patient unable to be seen today. I advised patient she couldn't get any rx without being seen. She was agreeable and will wait until appointment on Monday. I offered an another provider to be seen today and she refused.

## 2017-03-09 ENCOUNTER — Ambulatory Visit: Payer: Self-pay | Admitting: Physician Assistant

## 2017-03-09 DIAGNOSIS — Z0289 Encounter for other administrative examinations: Secondary | ICD-10-CM

## 2017-03-10 ENCOUNTER — Ambulatory Visit (INDEPENDENT_AMBULATORY_CARE_PROVIDER_SITE_OTHER): Payer: Medicare Other | Admitting: Physician Assistant

## 2017-03-10 ENCOUNTER — Encounter: Payer: Self-pay | Admitting: Physician Assistant

## 2017-03-10 VITALS — BP 122/82 | HR 80 | Temp 98.1°F | Resp 14 | Ht 61.0 in | Wt 139.0 lb

## 2017-03-10 DIAGNOSIS — E782 Mixed hyperlipidemia: Secondary | ICD-10-CM | POA: Diagnosis not present

## 2017-03-10 DIAGNOSIS — J441 Chronic obstructive pulmonary disease with (acute) exacerbation: Secondary | ICD-10-CM

## 2017-03-10 DIAGNOSIS — M545 Low back pain, unspecified: Secondary | ICD-10-CM

## 2017-03-10 DIAGNOSIS — Z720 Tobacco use: Secondary | ICD-10-CM

## 2017-03-10 LAB — POCT URINALYSIS DIPSTICK
BILIRUBIN UA: NEGATIVE
Blood, UA: NEGATIVE
GLUCOSE UA: NEGATIVE
Ketones, UA: NEGATIVE
LEUKOCYTES UA: NEGATIVE
NITRITE UA: NEGATIVE
Protein, UA: NEGATIVE
Spec Grav, UA: 1.02 (ref 1.010–1.025)
Urobilinogen, UA: 0.2 E.U./dL
pH, UA: 5 (ref 5.0–8.0)

## 2017-03-10 MED ORDER — BENZONATATE 100 MG PO CAPS
100.0000 mg | ORAL_CAPSULE | Freq: Two times a day (BID) | ORAL | 0 refills | Status: DC | PRN
Start: 2017-03-10 — End: 2017-04-29

## 2017-03-10 MED ORDER — PREDNISONE 20 MG PO TABS: 40.0000 mg | ORAL_TABLET | Freq: Every day | ORAL | 0 refills | Status: DC

## 2017-03-10 MED ORDER — ATORVASTATIN CALCIUM 40 MG PO TABS: 40.0000 mg | ORAL_TABLET | Freq: Every day | ORAL | 1 refills | Status: DC

## 2017-03-10 MED ORDER — ALBUTEROL SULFATE (2.5 MG/3ML) 0.083% IN NEBU
2.5000 mg | INHALATION_SOLUTION | Freq: Once | RESPIRATORY_TRACT | Status: AC
  Administered 2017-03-10: 2.5 mg via RESPIRATORY_TRACT

## 2017-03-10 NOTE — Assessment & Plan Note (Signed)
Neb given in office with improvement. Continue current chronic regimen. Start 5-day burst of prednisone. Do not feel there is a bacterial bronchitis present. COPD exacerbation form increased smoking. Cessation reviewed (see A/P). Follow-up scheduled.

## 2017-03-10 NOTE — Assessment & Plan Note (Signed)
Lipitor restarted. Discussed very important to take as directed and follow-up 4 weeks for repeat labs. She has been non-adherent previously.

## 2017-03-10 NOTE — Progress Notes (Signed)
Patient presents to clinic today c/o 2-3 weeks of cough that has increased in sputum production from baseline COPD. Notes cough is productive of white sputum. Endorses chest tightness and wheezing with coughing spells. Denies chest pain. Denies sinus symptoms. Has been taking the Symbicort as directed. Notes increased use of albuterol. Denies known fever, recent travel. Denies sick contact. Does note increased stressors recently -- mainly at home with family and finances -- has increase smoking to 1.5 ppd. Had quit previously. Denies any wish to stop smoking at present but wants to readdress in the near future. Denies SI/HI.  Patient previously set up for mammogram but did not have done. Last mammogram multiple years ago. Refuses mammogram at each visit. 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  Pain in left side of lumbar spine, noted for a few days. Has noted increase in urinary frequency. Denies dysuria, hematuria, nausea or vomiting. Denies heavy lifting or overexertion. Pain is sometimes exacerbated with certain movements. Has been sent to Nephrology but declined appointment. Wants to schedule now.   Past Medical History:  Diagnosis Date  . Arthritis   . Cholelithiasis 2011   s/p cholescystectomy   . COPD (chronic obstructive pulmonary disease) (HCC)    no history of PFTs, only diagnosed by CXR, not on any inhalers because she has not had a PCP in over 2 years  . Emphysema 2010   dx'd/CXR  . GERD (gastroesophageal reflux disease)   . History of heartburn   . History of renal cell carcinoma   . Hypercholesteremia    previously on pravastatin   . Hypertension    previously on Lisinopril/HCTZ  . Murmur    no  prior work-up  . Pneumonia 1980's   "walking"  . Renal cell carcinoma 01/2010   s/p right radical nephrectomy 01/2010,  follow[facility]  . Shortness of breath 09/26/11   "lately all the time"    Current Outpatient Prescriptions on File Prior to Visit  Medication  Sig Dispense Refill  . albuterol (PROVENTIL HFA;VENTOLIN HFA) 108 (90 Base) MCG/ACT inhaler Inhale 1-2 puffs into the lungs every 6 (six) hours as needed for wheezing or shortness of breath. 3 Inhaler 1  . albuterol (PROVENTIL) (2.5 MG/3ML) 0.083% nebulizer solution Take 3 mLs (2.5 mg total) by nebulization every 6 (six) hours as needed for wheezing or shortness of breath. 150 mL 1  . budesonide-formoterol (SYMBICORT) 160-4.5 MCG/ACT inhaler Inhale 2 puffs into the lungs 2 (two) times daily. 3 Inhaler 1  . fenofibrate 160 MG tablet Take 1 tablet (160 mg total) by mouth daily. 90 tablet 1  . losartan (COZAAR) 100 MG tablet Take 1 tablet (100 mg total) by mouth daily. 90 tablet 1  . metoprolol tartrate (LOPRESSOR) 25 MG tablet Take 1 tablet (25 mg total) by mouth 2 (two) times daily. 180 tablet 1   No current facility-administered medications on file prior to visit.     Allergies  Allergen Reactions  . Ace Inhibitors     Pseudoasthma/ renal failure    Family History  Problem Relation Age of Onset  . COPD Mother        DECEASED/SMOKED  . Diabetes Brother   . Diabetes Brother     Social History   Social History  . Marital status: Married    Spouse name: N/A  . Number of children: 3  . Years of education: 12   Occupational History  . Pharmacologist  manages cleaning business   Social History Main Topics  . Smoking status: Current Every Day Smoker    Packs/day: 1.50    Years: 53.00    Types: Cigarettes  . Smokeless tobacco: Never Used  . Alcohol use 0.0 oz/week     Comment:  "drink very rarely"  . Drug use: No     Comment: hx of cocaine last in 2006  . Sexual activity: No   Other Topics Concern  . None   Social History Narrative   Lives in [place] with her husband.    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    Patient has 3 grown children that do not live with her.    Patient does not have any medical  insurance.   Review of Systems - See HPI.  All other ROS are negative.  BP 122/82   Pulse 80   Temp 98.1 F (36.7 C) (Oral)   Resp 14   Ht 5\' 1"  (1.549 m)   Wt 139 lb (63 kg)   SpO2 98%   BMI 26.26 kg/m   Physical Exam  Constitutional: She is oriented to person, place, and time and well-developed, well-nourished, and in no distress.  HENT:  Head: Normocephalic and atraumatic.  Right Ear: Tympanic membrane and external ear normal.  Left Ear: Tympanic membrane and external ear normal.  Nose: Nose normal. No mucosal edema.  Mouth/Throat: Uvula is midline and oropharynx is clear and moist. No oropharyngeal exudate.  TM within normal limits bilaterally.  Eyes: Conjunctivae are normal.  Neck: Neck supple.  Cardiovascular: Normal rate, regular rhythm, normal heart sounds and intact distal pulses.   Pulmonary/Chest: No respiratory distress. She has wheezes. She has no rales. She exhibits no tenderness.  Abdominal: Normal appearance. There is no tenderness. There is no CVA tenderness.  Musculoskeletal:       Lumbar back: She exhibits tenderness. She exhibits normal range of motion and no bony tenderness.  Neurological: She is alert and oriented to person, place, and time.  Skin: Skin is warm and dry. No rash noted.  Psychiatric: Affect normal.  Vitals reviewed.  Assessment/Plan: Tobacco abuse disorder Denies desire to stop at present. Discussed again risks associated with continued smoking. Discussed low dose CT screen. She will think about this and let me know if she is willing to schedule.   HLD (hyperlipidemia) Lipitor restarted. Discussed very important to take as directed and follow-up 4 weeks for repeat labs. She has been non-adherent previously.   COPD exacerbation ([place]) Neb given in office with improvement. Continue current chronic regimen. Start 5-day burst of prednisone. Do not feel there is a bacterial bronchitis present. COPD exacerbation form increased smoking.  Cessation reviewed (see A/P). Follow-up scheduled.  Acute left-sided low back pain without sciatica Muscular in nature. Discussed topical medications. Tylenol for pain giving solitary kidney. Supportive measures reviewed. Urine dip unremarkable. Follow-up discussed.    [name], PA-C

## 2017-03-10 NOTE — Patient Instructions (Signed)
Please stay hydrated and get plenty of rest. Start plain Mucinex twice daily to thin congestion. Start the Prednisone and Tessalon as directed. Continue inhalers as directed. Follow-up with me next Monday.   If anything acutely worsens, please go to the ER.  Please use tylenol if needed for back pain.  Apply topical Icy Hot to the area. This should calm down as we calm cough down.  Call the Nephrologist (Kidney specialist) at [phone] to schedule an appointment.  The number for your Rheumatologist is [phone].  Please restart the Lipitor as directed.  We need to recheck levels in 4 weeks. Make a lab appointment for this.

## 2017-03-10 NOTE — Progress Notes (Signed)
Pre visit review using our clinic review tool, if applicable. No additional management support is needed unless otherwise documented below in the visit note. 

## 2017-03-10 NOTE — Assessment & Plan Note (Signed)
Muscular in nature. Discussed topical medications. Tylenol for pain giving solitary kidney. Supportive measures reviewed. Urine dip unremarkable. Follow-up discussed.

## 2017-03-10 NOTE — Assessment & Plan Note (Signed)
Denies desire to stop at present. Discussed again risks associated with continued smoking. Discussed low dose CT screen. She will think about this and let me know if she is willing to schedule.

## 2017-03-11 LAB — URINE CULTURE: ORGANISM ID, BACTERIA: NO GROWTH

## 2017-03-17 ENCOUNTER — Ambulatory Visit (INDEPENDENT_AMBULATORY_CARE_PROVIDER_SITE_OTHER): Payer: Medicare Other | Admitting: Physician Assistant

## 2017-03-17 ENCOUNTER — Encounter: Payer: Self-pay | Admitting: Physician Assistant

## 2017-03-17 VITALS — BP 132/88 | HR 64 | Temp 98.6°F | Resp 14 | Ht 61.0 in | Wt 141.0 lb

## 2017-03-17 DIAGNOSIS — J441 Chronic obstructive pulmonary disease with (acute) exacerbation: Secondary | ICD-10-CM

## 2017-03-17 MED ORDER — BUDESONIDE-FORMOTEROL FUMARATE 160-4.5 MCG/ACT IN AERO: 2.0000 | INHALATION_SPRAY | Freq: Two times a day (BID) | RESPIRATORY_TRACT | 1 refills | Status: DC

## 2017-03-17 NOTE — Progress Notes (Signed)
Pre visit review using our clinic review tool, if applicable. No additional management support is needed unless otherwise documented below in the visit note. 

## 2017-03-17 NOTE — Progress Notes (Signed)
Patient presents to clinic today for follow-up COPD exacerbation. Patient was prescribed Prednisone 40 mg - 5-day burst and Tessalon at last visit. Patient was also instructed to use Mucinex and continue her Symbicort and Albuterol. Patient endorses continuing her Symbicort and Albuterol. States she did not pick up the other medications as she did not have the money to get them. Is picking them up today. Notes improvement in her cough and wheeze but they are still present. Has cut smoking in half. Denies productive cough. Denies fever, chills, chest pain or fatigue.   Past Medical History:  Diagnosis Date  . Arthritis   . Cholelithiasis 2011   s/p cholescystectomy   . COPD (chronic obstructive pulmonary disease) (HCC)    no history of PFTs, only diagnosed by CXR, not on any inhalers because she has not had a PCP in over 2 years  . Emphysema 2010   dx'd/CXR  . GERD (gastroesophageal reflux disease)   . History of heartburn   . History of renal cell carcinoma   . Hypercholesteremia    previously on pravastatin   . Hypertension    previously on Lisinopril/HCTZ  . Murmur    no  prior work-up  . Pneumonia 1980's   "walking"  . Renal cell carcinoma 01/2010   s/p right radical nephrectomy 01/2010,  [facility]  . Shortness of breath 09/26/11   "lately all the time"    Current Outpatient Prescriptions on File Prior to Visit  Medication Sig Dispense Refill  . albuterol (PROVENTIL HFA;VENTOLIN HFA) 108 (90 Base) MCG/ACT inhaler Inhale 1-2 puffs into the lungs every 6 (six) hours as needed for wheezing or shortness of breath. 3 Inhaler 1  . albuterol (PROVENTIL) (2.5 MG/3ML) 0.083% nebulizer solution Take 3 mLs (2.5 mg total) by nebulization every 6 (six) hours as needed for wheezing or shortness of breath. 150 mL 1  . atorvastatin (LIPITOR) 40 MG tablet Take 1 tablet (40 mg total) by mouth daily at 6 PM. 30 tablet 1  . benzonatate (TESSALON) 100 MG capsule Take 1 capsule  (100 mg total) by mouth 2 (two) times daily as needed for cough. 20 capsule 0  . budesonide-formoterol (SYMBICORT) 160-4.5 MCG/ACT inhaler Inhale 2 puffs into the lungs 2 (two) times daily. 3 Inhaler 1  . fenofibrate 160 MG tablet Take 1 tablet (160 mg total) by mouth daily. 90 tablet 1  . losartan (COZAAR) 100 MG tablet Take 1 tablet (100 mg total) by mouth daily. 90 tablet 1  . metoprolol tartrate (LOPRESSOR) 25 MG tablet Take 1 tablet (25 mg total) by mouth 2 (two) times daily. 180 tablet 1  . predniSONE (DELTASONE) 20 MG tablet Take 2 tablets (40 mg total) by mouth daily with breakfast. 10 tablet 0   No current facility-administered medications on file prior to visit.     Allergies  Allergen Reactions  . Ace Inhibitors     Pseudoasthma/ renal failure    Family History  Problem Relation Age of Onset  . COPD Mother        DECEASED/SMOKED  . Diabetes Brother   . Diabetes Brother     Social History   Social History  . Marital status: Married    Spouse name: N/A  . Number of children: 3  . Years of education: 12   Occupational History  . [employer]   Social History Main Topics  . Smoking status: Current Every Day Smoker  Packs/day: 1.50    Years: 53.00    Types: Cigarettes  . Smokeless tobacco: Never Used  . Alcohol use 0.0 oz/week     Comment:  "drink very rarely"  . Drug use: No     Comment: hx of cocaine last in 2006  . Sexual activity: No   Other Topics Concern  . Not on file   Social History Narrative   Lives in [place] with her husband.    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    Patient has 3 grown children that do not live with her.    Patient does not have any medical insurance.    Review of Systems - See HPI.  All other ROS are negative.  There were no vitals taken for this visit.  Physical Exam  Constitutional: She is oriented to person, place, and  time and well-developed, well-nourished, and in no distress.  HENT:  Head: Normocephalic and atraumatic.  Right Ear: External ear normal.  Left Ear: External ear normal.  Nose: Nose normal.  Mouth/Throat: Oropharynx is clear and moist. No oropharyngeal exudate.  TM within normal limits bilaterally.   Eyes: Conjunctivae are normal.  Neck: Neck supple.  Cardiovascular: Normal rate, regular rhythm, normal heart sounds and intact distal pulses.   Pulmonary/Chest: No respiratory distress. She has wheezes. She has no rales. She exhibits no tenderness.  Neurological: She is alert and oriented to person, place, and time.  Skin: Skin is warm and dry. No rash noted.  Psychiatric: Affect normal.  Vitals reviewed.  Recent Results (from the past 2160 hour(s))  POCT Urinalysis Dipstick     Status: Normal   Collection Time: 03/10/17 11:32 AM  Result Value Ref Range   Color, UA yellow    Clarity, UA clear    Glucose, UA negative    Bilirubin, UA negative    Ketones, UA negative    Spec Grav, UA 1.020 1.010 - 1.025   Blood, UA negative    pH, UA 5.0 5.0 - 8.0   Protein, UA negative    Urobilinogen, UA 0.2 0.2 or 1.0 E.U./dL   Nitrite, UA negative    Leukocytes, UA Negative Negative  Urine culture     Status: None   Collection Time: 03/10/17 11:35 AM  Result Value Ref Range   Organism ID, Bacteria NO GROWTH     Assessment/Plan: 1. COPD exacerbation ([place]) Duoneb given x 1 with significant improvement. Patient picking up medications today. Take as directed. Follow-up if symptoms are not continuing to resolve. Discussed with her that she has got to stop smoking to prevent deterioration of her health. She declines further intervention today.     [name], PA-C

## 2017-03-17 NOTE — Patient Instructions (Signed)
Please pickup medications and take as directed to help get you over this hurtle.  Stay hydrated and get plenty of rest.  Please consider letting us work on patient assistance to get smoking cessation medications for free.  This is only going to continue to worsen over time if we don't stop the smoking.

## 2017-04-08 ENCOUNTER — Ambulatory Visit: Payer: Self-pay | Admitting: Physician Assistant

## 2017-04-10 ENCOUNTER — Ambulatory Visit (INDEPENDENT_AMBULATORY_CARE_PROVIDER_SITE_OTHER): Payer: Medicare Other | Admitting: Physician Assistant

## 2017-04-10 ENCOUNTER — Encounter: Payer: Self-pay | Admitting: Physician Assistant

## 2017-04-10 VITALS — BP 128/90 | HR 74 | Temp 98.3°F | Resp 14 | Ht 61.0 in | Wt 141.0 lb

## 2017-04-10 DIAGNOSIS — S161XXA Strain of muscle, fascia and tendon at neck level, initial encounter: Secondary | ICD-10-CM | POA: Diagnosis not present

## 2017-04-10 DIAGNOSIS — E782 Mixed hyperlipidemia: Secondary | ICD-10-CM | POA: Diagnosis not present

## 2017-04-10 MED ORDER — ATORVASTATIN CALCIUM 40 MG PO TABS: 40.0000 mg | ORAL_TABLET | Freq: Every day | ORAL | 1 refills | Status: DC

## 2017-04-10 MED ORDER — TIZANIDINE HCL 2 MG PO TABS: 2.0000 mg | ORAL_TABLET | Freq: Three times a day (TID) | ORAL | 0 refills | Status: DC | PRN

## 2017-04-10 NOTE — Progress Notes (Signed)
Pre visit review using our clinic review tool, if applicable. No additional management support is needed unless otherwise documented below in the visit note. 

## 2017-04-10 NOTE — Patient Instructions (Signed)
Please take the tizanidine (muscle relaxant) each evening as directed. You can take up to 3 times daily but do not drive or operate heavy machinery while on this medication. Take Tylenol Arthritis over the counter. Apply heating pad to the area in 10-minute intervals, a couple of times per day. You can apply a topical cream like Salon Pas to the area as well.  Follow-up if not resolving.

## 2017-04-10 NOTE — Progress Notes (Signed)
Patient presents to clinic today c/o 5 days of R-sided cervical neck pain and tension. Notes pain worse with ROM but there has been constant tension and aching in the area. Denies trauma or injury. Denies radiation of pain elsewhere. Denies vision changes, numbness/tingling. Denies fever, chills, malaise. Has not taken anything for her symptoms.   Past Medical History:  Diagnosis Date  . Arthritis   . Cholelithiasis 2011   s/p cholescystectomy   . COPD (chronic obstructive pulmonary disease) (HCC)    no history of PFTs, only diagnosed by CXR, not on any inhalers because she has not had a PCP in over 2 years  . Emphysema 2010   dx'd/CXR  . GERD (gastroesophageal reflux disease)   . History of heartburn   . History of renal cell carcinoma   . Hypercholesteremia    previously on pravastatin   . Hypertension    previously on Lisinopril/HCTZ  . Murmur    no  prior work-up  . Pneumonia 1980's   "walking"  . Renal cell carcinoma 01/2010   s/p right radical nephrectomy 01/2010,  [facility]  . Shortness of breath 09/26/11   "lately all the time"    Current Outpatient Prescriptions on File Prior to Visit  Medication Sig Dispense Refill  . albuterol (PROVENTIL HFA;VENTOLIN HFA) 108 (90 Base) MCG/ACT inhaler Inhale 1-2 puffs into the lungs every 6 (six) hours as needed for wheezing or shortness of breath. 3 Inhaler 1  . albuterol (PROVENTIL) (2.5 MG/3ML) 0.083% nebulizer solution Take 3 mLs (2.5 mg total) by nebulization every 6 (six) hours as needed for wheezing or shortness of breath. 150 mL 1  . benzonatate (TESSALON) 100 MG capsule Take 1 capsule (100 mg total) by mouth 2 (two) times daily as needed for cough. 20 capsule 0  . budesonide-formoterol (SYMBICORT) 160-4.5 MCG/ACT inhaler Inhale 2 puffs into the lungs 2 (two) times daily. 3 Inhaler 1  . fenofibrate 160 MG tablet Take 1 tablet (160 mg total) by mouth daily. 90 tablet 1  . metoprolol tartrate (LOPRESSOR) 25 MG  tablet Take 1 tablet (25 mg total) by mouth 2 (two) times daily. 180 tablet 1  . losartan (COZAAR) 100 MG tablet Take 1 tablet (100 mg total) by mouth daily. 90 tablet 1   No current facility-administered medications on file prior to visit.     Allergies  Allergen Reactions  . Ace Inhibitors     Pseudoasthma/ renal failure    Family History  Problem Relation Age of Onset  . COPD Mother        DECEASED/SMOKED  . Diabetes Brother   . Diabetes Brother     Social History   Social History  . Marital status: Married    Spouse name: N/A  . Number of children: 3  . Years of education: 12   Occupational History  . [employer]   Social History Main Topics  . Smoking status: Current Every Day Smoker    Packs/day: 1.50    Years: 53.00    Types: Cigarettes  . Smokeless tobacco: Never Used  . Alcohol use 0.0 oz/week     Comment:  "drink very rarely"  . Drug use: No     Comment: hx of cocaine last in 2006  . Sexual activity: No   Other Topics Concern  . None   Social History Narrative   Lives in [place] with her husband.    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    Patient has 3 grown children that do not live with her.    Patient does not have any medical insurance.   Review of Systems - See HPI.  All other ROS are negative.  BP 128/90   Pulse 74   Temp 98.3 F (36.8 C) (Oral)   Resp 14   Ht 5\' 1"  (1.549 m)   Wt 141 lb (64 kg)   SpO2 98%   BMI 26.64 kg/m   Physical Exam  Constitutional: She is oriented to person, place, and time and well-developed, well-nourished, and in no distress.  HENT:  Head: Normocephalic and atraumatic.  Eyes: Conjunctivae are normal.  Neck: Neck supple. Muscular tenderness present. No spinous process tenderness present. Normal range of motion present.    Cardiovascular: Normal rate, regular rhythm, normal heart sounds and intact distal pulses.    Pulmonary/Chest: Effort normal and breath sounds normal. No respiratory distress. She has no wheezes. She has no rales. She exhibits no tenderness.  Lymphadenopathy:    She has no cervical adenopathy.  Neurological: She is alert and oriented to person, place, and time.  Skin: Skin is warm and dry. No rash noted.  Psychiatric: Affect normal.  Vitals reviewed.   Recent Results (from the past 2160 hour(s))  POCT Urinalysis Dipstick     Status: Normal   Collection Time: 03/10/17 11:32 AM  Result Value Ref Range   Color, UA yellow    Clarity, UA clear    Glucose, UA negative    Bilirubin, UA negative    Ketones, UA negative    Spec Grav, UA 1.020 1.010 - 1.025   Blood, UA negative    pH, UA 5.0 5.0 - 8.0   Protein, UA negative    Urobilinogen, UA 0.2 0.2 or 1.0 E.U./dL   Nitrite, UA negative    Leukocytes, UA Negative Negative  Urine culture     Status: None   Collection Time: 03/10/17 11:35 AM  Result Value Ref Range   Organism ID, Bacteria NO GROWTH    Assessment/Plan: Strain of cervical portion of right trapezius muscle Strain of sternocleidomastoid muscle, initial encounter NSAIDS contraindicated giving solitary kidney and CKD. Will start tylenol arthritis. Will also Rx Tizanidine for muscle tension and spasm. Supportive measures and OTC medications reviewed. Follow-up discussed.    [name], PA-C

## 2017-04-29 ENCOUNTER — Encounter: Payer: Self-pay | Admitting: Physician Assistant

## 2017-04-29 ENCOUNTER — Other Ambulatory Visit: Payer: Self-pay | Admitting: Acute Care

## 2017-04-29 ENCOUNTER — Ambulatory Visit (INDEPENDENT_AMBULATORY_CARE_PROVIDER_SITE_OTHER): Payer: Medicare Other | Admitting: Physician Assistant

## 2017-04-29 VITALS — BP 118/84 | HR 66 | Temp 98.5°F | Resp 14 | Ht 61.0 in | Wt 140.0 lb

## 2017-04-29 DIAGNOSIS — K219 Gastro-esophageal reflux disease without esophagitis: Secondary | ICD-10-CM | POA: Diagnosis not present

## 2017-04-29 DIAGNOSIS — Z1231 Encounter for screening mammogram for malignant neoplasm of breast: Secondary | ICD-10-CM

## 2017-04-29 DIAGNOSIS — J449 Chronic obstructive pulmonary disease, unspecified: Secondary | ICD-10-CM | POA: Diagnosis not present

## 2017-04-29 DIAGNOSIS — Z23 Encounter for immunization: Secondary | ICD-10-CM | POA: Diagnosis not present

## 2017-04-29 DIAGNOSIS — Z1239 Encounter for other screening for malignant neoplasm of breast: Secondary | ICD-10-CM

## 2017-04-29 DIAGNOSIS — F17210 Nicotine dependence, cigarettes, uncomplicated: Principal | ICD-10-CM

## 2017-04-29 DIAGNOSIS — Z0000 Encounter for general adult medical examination without abnormal findings: Secondary | ICD-10-CM | POA: Diagnosis not present

## 2017-04-29 DIAGNOSIS — Z720 Tobacco use: Secondary | ICD-10-CM | POA: Diagnosis not present

## 2017-04-29 DIAGNOSIS — R94120 Abnormal auditory function study: Secondary | ICD-10-CM | POA: Diagnosis not present

## 2017-04-29 DIAGNOSIS — I10 Essential (primary) hypertension: Secondary | ICD-10-CM

## 2017-04-29 LAB — CBC WITH DIFFERENTIAL/PLATELET
BASOS ABS: 0.1 10*3/uL (ref 0.0–0.1)
Basophils Relative: 1.2 % (ref 0.0–3.0)
EOS ABS: 0.2 10*3/uL (ref 0.0–0.7)
EOS PCT: 2.9 % (ref 0.0–5.0)
HCT: 40.8 % (ref 36.0–46.0)
Hemoglobin: 13.8 g/dL (ref 12.0–15.0)
Lymphocytes Relative: 30.9 % (ref 12.0–46.0)
Lymphs Abs: 2.1 10*3/uL (ref 0.7–4.0)
MCHC: 33.8 g/dL (ref 30.0–36.0)
MCV: 86.8 fl (ref 78.0–100.0)
MONO ABS: 0.5 10*3/uL (ref 0.1–1.0)
Monocytes Relative: 7 % (ref 3.0–12.0)
NEUTROS PCT: 58 % (ref 43.0–77.0)
Neutro Abs: 3.9 10*3/uL (ref 1.4–7.7)
Platelets: 314 10*3/uL (ref 150.0–400.0)
RBC: 4.69 Mil/uL (ref 3.87–5.11)
RDW: 13.5 % (ref 11.5–15.5)
WBC: 6.8 10*3/uL (ref 4.0–10.5)

## 2017-04-29 LAB — COMPREHENSIVE METABOLIC PANEL
ALT: 13 U/L (ref 0–35)
AST: 17 U/L (ref 0–37)
Albumin: 4.3 g/dL (ref 3.5–5.2)
Alkaline Phosphatase: 84 U/L (ref 39–117)
BILIRUBIN TOTAL: 0.5 mg/dL (ref 0.2–1.2)
BUN: 23 mg/dL (ref 6–23)
CO2: 27 mEq/L (ref 19–32)
CREATININE: 1.42 mg/dL — AB (ref 0.40–1.20)
Calcium: 10.6 mg/dL — ABNORMAL HIGH (ref 8.4–10.5)
Chloride: 102 mEq/L (ref 96–112)
GFR: 38.95 mL/min — ABNORMAL LOW (ref >60.00)
GLUCOSE: 88 mg/dL (ref 70–99)
Potassium: 5 mEq/L (ref 3.5–5.1)
SODIUM: 137 meq/L (ref 135–145)
TOTAL PROTEIN: 7.4 g/dL (ref 6.0–8.3)

## 2017-04-29 LAB — URINALYSIS, ROUTINE W REFLEX MICROSCOPIC
BILIRUBIN URINE: NEGATIVE
HGB URINE DIPSTICK: NEGATIVE
KETONES UR: NEGATIVE
LEUKOCYTES UA: NEGATIVE
NITRITE: NEGATIVE
RBC / HPF: NONE SEEN (ref 0–?)
Specific Gravity, Urine: 1.015 (ref 1.000–1.030)
TOTAL PROTEIN, URINE-UPE24: NEGATIVE
URINE GLUCOSE: NEGATIVE
UROBILINOGEN UA: 0.2 (ref 0.0–1.0)
pH: 6 (ref 5.0–8.0)

## 2017-04-29 LAB — LIPID PANEL
CHOLESTEROL: 256 mg/dL — AB (ref 0–200)
HDL: 70.1 mg/dL (ref >39.00)
NonHDL: 186.03
Total CHOL/HDL Ratio: 4
Triglycerides: 324 mg/dL — ABNORMAL HIGH (ref 0.0–149.0)
VLDL: 64.8 mg/dL — ABNORMAL HIGH (ref 0.0–40.0)

## 2017-04-29 LAB — TSH: TSH: 1.59 u[IU]/mL (ref 0.35–4.50)

## 2017-04-29 LAB — LDL CHOLESTEROL, DIRECT: Direct LDL: 121 mg/dL

## 2017-04-29 NOTE — Progress Notes (Signed)
Subjective:   Katie [name] is a 71 y.o. female who presents for an Initial Medicare Annual Wellness Visit.  Review of Systems    No ROS.  Medicare Wellness Visit. Additional risk factors are reflected in the social history.   Cardiac Risk Factors include: advanced age (>55men, >37 women);hypertension;sedentary lifestyle;smoking/ tobacco exposure   Sleep patterns: Sleeps 6 hours, naps during the day. Up to void x 2.  Home Safety/Smoke Alarms: Feels safe in home. Smoke alarms in place.  Living environment; residence and Firearm Safety: Lives with son in apartment (1st floor).  Seat Belt Safety/Bike Helmet: Wears seat belt.   Counseling:   Eye Exam-Last exam > 2 years. Will make appointment Dental-Last exam > 2 years. Full dentures. Gum care discussed.   Female:   [id number]     Mammo-10/09/2011 Declines   Dexa scan-12/01/2016, Osteoporosis.         CCS-Declines     Objective:    Today's Vitals   04/29/17 1035  BP: 118/84  Pulse: 66  Resp: 14  Temp: 98.5 F (36.9 C)  TempSrc: Oral  SpO2: 97%  Weight: 140 lb (63.5 kg)  Height: 5\' 1"  (1.549 m)   Body mass index is 26.45 kg/m.   Current Medications (verified) Outpatient Encounter Prescriptions as of 04/29/2017  Medication Sig  . albuterol (PROVENTIL HFA;VENTOLIN HFA) 108 (90 Base) MCG/ACT inhaler Inhale 1-2 puffs into the lungs every 6 (six) hours as needed for wheezing or shortness of breath.  Marland Kitchen albuterol (PROVENTIL) (2.5 MG/3ML) 0.083% nebulizer solution Take 3 mLs (2.5 mg total) by nebulization every 6 (six) hours as needed for wheezing or shortness of breath.  Marland Kitchen atorvastatin (LIPITOR) 40 MG tablet Take 1 tablet (40 mg total) by mouth daily at 6 PM.  . budesonide-formoterol (SYMBICORT) 160-4.5 MCG/ACT inhaler Inhale 2 puffs into the lungs 2 (two) times daily.  . fenofibrate 160 MG tablet Take 1 tablet (160 mg total) by mouth daily.  Marland Kitchen losartan (COZAAR) 100 MG tablet Take 1 tablet (100 mg total) by mouth daily.  .  metoprolol tartrate (LOPRESSOR) 25 MG tablet Take 1 tablet (25 mg total) by mouth 2 (two) times daily.  . [DISCONTINUED] benzonatate (TESSALON) 100 MG capsule Take 1 capsule (100 mg total) by mouth 2 (two) times daily as needed for cough.  . [DISCONTINUED] tiZANidine (ZANAFLEX) 2 MG tablet Take 1 tablet (2 mg total) by mouth every 8 (eight) hours as needed for muscle spasms.   No facility-administered encounter medications on file as of 04/29/2017.     Allergies (verified) Ace inhibitors   History: Past Medical History:  Diagnosis Date  . Arthritis   . Cholelithiasis 2011   s/p cholescystectomy   . COPD (chronic obstructive pulmonary disease) (HCC)    no history of PFTs, only diagnosed by CXR, not on any inhalers because she has not had a PCP in over 2 years  . Emphysema 2010   dx'd/CXR  . GERD (gastroesophageal reflux disease)   . History of heartburn   . History of renal cell carcinoma   . Hypercholesteremia    previously on pravastatin   . Hypertension    previously on Lisinopril/HCTZ  . Murmur    no  prior work-up  . Pneumonia 1980's   "walking"  . Renal cell carcinoma 01/2010   s/p right radical nephrectomy 01/2010,  followed by [facility]  . Shortness of breath 09/26/11   "lately all the time"   Past Surgical History:  Procedure Laterality Date  .  APPENDECTOMY  1970's  . CARDIAC CATHETERIZATION  09/2011  . CHOLECYSTECTOMY  01/2010  . DIAGNOSTIC LAPAROSCOPY    . LEFT AND RIGHT HEART CATHETERIZATION WITH CORONARY ANGIOGRAM N/A 09/30/2011   Procedure: LEFT AND RIGHT HEART CATHETERIZATION WITH CORONARY ANGIOGRAM;  Surgeon: Candee Furbish, MD;  Location: [facility] CATH LAB;  Service: Cardiovascular;  Laterality: N/A;  . MULTIPLE EXTRACTIONS WITH ALVEOLOPLASTY  10/06/2011   Procedure: MULTIPLE EXTRACION WITH ALVEOLOPLASTY;  Surgeon: Lenn Cal, DDS;  Location: [facility];  Service: Oral Surgery;  Laterality: N/A;  Multiple extraction of tooth #'s 1, 6, 8, 10, 11, 22, 23, 26,  27, 28, 29 with alveoloplasty and Upper right buccal exostoses reductions.  . NEPHRECTOMY RADICAL  01/2010   right   . OVARIAN CYST REMOVAL  1970's   "went through belly button"  . TEE WITHOUT CARDIOVERSION  10/01/2011   Procedure: TRANSESOPHAGEAL ECHOCARDIOGRAM (TEE);  Surgeon: [name];  Location: [facility];  Service: Cardiovascular;  Laterality: N/A;  . TUBAL LIGATION  1970's  . US ECHOCARDIOGRAPHY  09/2011   Family History  Problem Relation Age of Onset  . COPD Mother        DECEASED/SMOKED  . Diabetes Brother   . Diabetes Brother    Social History   Occupational History  . [employer]   Social History Main Topics  . Smoking status: Current Every Day Smoker    Packs/day: 1.50    Years: 53.00    Types: Cigarettes  . Smokeless tobacco: Never Used  . Alcohol use 0.0 oz/week     Comment:  "drink very rarely"  . Drug use: No     Comment: hx of cocaine last in 2006  . Sexual activity: No    Tobacco Counseling Ready to quit: Not Answered Counseling given: Yes   Activities of Daily Living In your present state of health, do you have any difficulty performing the following activities: 04/29/2017 04/29/2017  Hearing? N N  Vision? N N  Difficulty concentrating or making decisions? N N  Walking or climbing stairs? Y N  Dressing or bathing? N N  Doing errands, shopping? N N  Preparing Food and eating ? N -  Using the Toilet? N -  In the past six months, have you accidently leaked urine? N -  Do you have problems with loss of bowel control? N -  Managing your Medications? N -  Managing your Finances? N -  Housekeeping or managing your Housekeeping? N -  Some recent data might be hidden    Immunizations and Health Maintenance Immunization History  Administered Date(s) Administered  . Influenza Split 09/27/2011  . Influenza,inj,Quad PF,36+ Mos 07/14/2013, 01/02/2015, 09/10/2016  . Pneumococcal  Conjugate-13 04/29/2017  . Pneumococcal Polysaccharide-23 09/27/2011, 08/15/2013  . Tdap 10/09/2011   There are no preventive care reminders to display for this patient.  Patient Care Team: Delorse Limber as PCP - General (Physician Assistant) Comer, Okey Regal, MD (Internal Medicine) Skeet Latch, MD as Attending Physician (Cardiology) Elmarie Shiley, MD as Consulting Physician (Nephrology)  Indicate any recent Medical Services you may have received from other than [facility] providers in the past year (date may be approximate).     Assessment:   This is a routine wellness examination for [facility]. Physical assessment deferred to PCP.  Hearing/Vision screen  Hearing Screening   Method: Audiometry   125Hz  250Hz  500Hz  1000Hz  2000Hz  3000Hz  4000Hz  6000Hz  8000Hz   Right ear:   Pass Fail  Pass  Fail    Left ear:   Pass Fail Pass  Fail    Comments: Audiology consult ordered.   Vision Screening Comments: Wears glasses.    Dietary issues and exercise activities discussed: Current Exercise Habits: The patient does not participate in regular exercise at present (housework), Exercise limited by: orthopedic condition(s)   Diet (meal preparation, eat out, water intake, caffeinated beverages, dairy products, fruits and vegetables): Drinks very small amt of water, soda and coffee  Breakfast: oatmeal Lunch: snacks on sweets Dinner: protein, vegetables     Discussed heart healthy diet and increasing activity as tolerated.   Goals    . Quit smoking / using tobacco    . Quit smoking / using tobacco          Stop smoking       Depression Screen PHQ 2/9 Scores 04/29/2017 04/29/2017 11/10/2016 10/26/2015 10/09/2011  PHQ - 2 Score 0 0 0 0 0  PHQ- 9 Score - 0 3 - -    Fall Risk Fall Risk  04/29/2017 04/29/2017 11/10/2016 10/26/2015  Falls in the past year? No No No No    Cognitive Function:       Ad8 score reviewed for issues:  Issues making decisions: no  Less interest in hobbies /  activities: no  Repeats questions, stories (family complaining): no  Trouble using ordinary gadgets (microwave, computer, phone): no  Forgets the month or year: no  Mismanaging finances: no  Remembering appts: no  Daily problems with thinking and/or memory: no Ad8 score is=0     Screening Tests Health Maintenance  Topic Date Due  . Hepatitis C Screening  09/30/2017 (Originally 03-13-1946)  . MAMMOGRAM  03/10/2018 (Originally 01/05/1998)  . INFLUENZA VACCINE  05/20/2017  . DTaP/Tdap/Td (2 - Td) 10/08/2021  . COLONOSCOPY  10/08/2021  . TETANUS/TDAP  10/08/2021  . DEXA SCAN  Completed  . PNA vac Low Risk Adult  Completed  Declines Mammogram Declines colon cancer screening     Plan:    Continue doing brain stimulating activities (puzzles, reading, adult coloring books, staying active) to keep memory sharp.   Schedule Audiology appointment  Schedule Eye Appointment.   I have personally reviewed and noted the following in the patient's chart:   . Medical and social history . Use of alcohol, tobacco or illicit drugs  . Current medications and supplements . Functional ability and status . Nutritional status . Physical activity . Advanced directives . List of other physicians . Hospitalizations, surgeries, and ER visits in previous 12 months . Vitals . Screenings to include cognitive, depression, and falls . Referrals and appointments  In addition, I have reviewed and discussed with patient certain preventive protocols, quality metrics, and best practice recommendations. A written personalized care plan for preventive services as well as general preventive health recommendations were provided to patient.     Gerilyn Nestle, RN   04/29/2017

## 2017-04-29 NOTE — Patient Instructions (Addendum)
Please go to the lab for blood work after your wellness visit with Maudie Mercury. Continue chronic medications as directed. If kidney and liver function are normal, we will start the voltaren gel.  Please restart the Chantix and take as directed.  Follow-up with specialists as directed. You will be contacted to schedule a lung cancer screening imaging.  Please reconsider the colon cancer and breast cancer screening.  Continue doing brain stimulating activities (puzzles, reading, adult coloring books, staying active) to keep memory sharp.   Schedule Audiology appointment  Schedule Eye Appointment.  Health Maintenance, Female Adopting a healthy lifestyle and getting preventive care can go a long way to promote health and wellness. Talk with your health care provider about what schedule of regular examinations is right for you. This is a good chance for you to check in with your provider about disease prevention and staying healthy. In between checkups, there are plenty of things you can do on your own. Experts have done a lot of research about which lifestyle changes and preventive measures are most likely to keep you healthy. Ask your health care provider for more information. Weight and diet Eat a healthy diet  Be sure to include plenty of vegetables, fruits, low-fat dairy products, and lean protein.  Do not eat a lot of foods high in solid fats, added sugars, or salt.  Get regular exercise. This is one of the most important things you can do for your health. ? Most adults should exercise for at least 150 minutes each week. The exercise should increase your heart rate and make you sweat (moderate-intensity exercise). ? Most adults should also do strengthening exercises at least twice a week. This is in addition to the moderate-intensity exercise.  Maintain a healthy weight  Body mass index (BMI) is a measurement that can be used to identify possible weight problems. It estimates body fat based  on height and weight. Your health care provider can help determine your BMI and help you achieve or maintain a healthy weight.  For females 69 years of age and older: ? A BMI below 18.5 is considered underweight. ? A BMI of 18.5 to 24.9 is normal. ? A BMI of 25 to 29.9 is considered overweight. ? A BMI of 30 and above is considered obese.  Watch levels of cholesterol and blood lipids  You should start having your blood tested for lipids and cholesterol at 69 years of age, then have this test every 5 years.  You may need to have your cholesterol levels checked more often if: ? Your lipid or cholesterol levels are high. ? You are older than 69 years of age. ? You are at high risk for heart disease.  Cancer screening Lung Cancer  Lung cancer screening is recommended for adults 69-61 years old who are at high risk for lung cancer because of a history of smoking.  A yearly low-dose CT scan of the lungs is recommended for people who: ? Currently smoke. ? Have quit within the past 15 years. ? Have at least a 30-pack-year history of smoking. A pack year is smoking an average of one pack of cigarettes a day for 1 year.  Yearly screening should continue until it has been 15 years since you quit.  Yearly screening should stop if you develop a health problem that would prevent you from having lung cancer treatment.  Breast Cancer  Practice breast self-awareness. This means understanding how your breasts normally appear and feel.  It also means doing  regular breast self-exams. Let your health care provider know about any changes, no matter how small.  If you are in your 20s or 30s, you should have a clinical breast exam (CBE) by a health care provider every 1-3 years as part of a regular health exam.  If you are 60 or older, have a CBE every year. Also consider having a breast X-ray (mammogram) every year.  If you have a family history of breast cancer, talk to your health care provider  about genetic screening.  If you are at high risk for breast cancer, talk to your health care provider about having an MRI and a mammogram every year.  Breast cancer gene (BRCA) assessment is recommended for women who have family members with BRCA-related cancers. BRCA-related cancers include: ? Breast. ? Ovarian. ? Tubal. ? Peritoneal cancers.  Results of the assessment will determine the need for genetic counseling and BRCA1 and BRCA2 testing.  Cervical Cancer Your health care provider may recommend that you be screened regularly for cancer of the pelvic organs (ovaries, uterus, and vagina). This screening involves a pelvic examination, including checking for microscopic changes to the surface of your cervix (Pap test). You may be encouraged to have this screening done every 3 years, beginning at age 5.  For women ages 29-65, health care providers may recommend pelvic exams and Pap testing every 3 years, or they may recommend the Pap and pelvic exam, combined with testing for human papilloma virus (HPV), every 5 years. Some types of HPV increase your risk of cervical cancer. Testing for HPV may also be done on women of any age with unclear Pap test results.  Other health care providers may not recommend any screening for nonpregnant women who are considered low risk for pelvic cancer and who do not have symptoms. Ask your health care provider if a screening pelvic exam is right for you.  If you have had past treatment for cervical cancer or a condition that could lead to cancer, you need Pap tests and screening for cancer for at least 20 years after your treatment. If Pap tests have been discontinued, your risk factors (such as having a new sexual partner) need to be reassessed to determine if screening should resume. Some women have medical problems that increase the chance of getting cervical cancer. In these cases, your health care provider may recommend more frequent screening and Pap  tests.  Colorectal Cancer  This type of cancer can be detected and often prevented.  Routine colorectal cancer screening usually begins at 69 years of age and continues through 69 years of age.  Your health care provider may recommend screening at an earlier age if you have risk factors for colon cancer.  Your health care provider may also recommend using home test kits to check for hidden blood in the stool.  A small camera at the end of a tube can be used to examine your colon directly (sigmoidoscopy or colonoscopy). This is done to check for the earliest forms of colorectal cancer.  Routine screening usually begins at age 50.  Direct examination of the colon should be repeated every 5-10 years through 69 years of age. However, you may need to be screened more often if early forms of precancerous polyps or small growths are found.  Skin Cancer  Check your skin from head to toe regularly.  Tell your health care provider about any new moles or changes in moles, especially if there is a change in a mole's  shape or color.  Also tell your health care provider if you have a mole that is larger than the size of a pencil eraser.  Always use sunscreen. Apply sunscreen liberally and repeatedly throughout the day.  Protect yourself by wearing long sleeves, pants, a wide-brimmed hat, and sunglasses whenever you are outside.  Heart disease, diabetes, and high blood pressure  High blood pressure causes heart disease and increases the risk of stroke. High blood pressure is more likely to develop in: ? People who have blood pressure in the high end of the normal range (130-139/85-89 mm Hg). ? People who are overweight or obese. ? People who are African American.  If you are 65-64 years of age, have your blood pressure checked every 3-5 years. If you are 12 years of age or older, have your blood pressure checked every year. You should have your blood pressure measured twice-once when you are at  a hospital or clinic, and once when you are not at a hospital or clinic. Record the average of the two measurements. To check your blood pressure when you are not at a hospital or clinic, you can use: ? An automated blood pressure machine at a pharmacy. ? A home blood pressure monitor.  If you are between 53 years and 43 years old, ask your health care provider if you should take aspirin to prevent strokes.  Have regular diabetes screenings. This involves taking a blood sample to check your fasting blood sugar level. ? If you are at a normal weight and have a low risk for diabetes, have this test once every three years after 69 years of age. ? If you are overweight and have a high risk for diabetes, consider being tested at a younger age or more often. Preventing infection Hepatitis B  If you have a higher risk for hepatitis B, you should be screened for this virus. You are considered at high risk for hepatitis B if: ? You were born in a country where hepatitis B is common. Ask your health care provider which countries are considered high risk. ? Your parents were born in a high-risk country, and you have not been immunized against hepatitis B (hepatitis B vaccine). ? You have HIV or AIDS. ? You use needles to inject street drugs. ? You live with someone who has hepatitis B. ? You have had sex with someone who has hepatitis B. ? You get hemodialysis treatment. ? You take certain medicines for conditions, including cancer, organ transplantation, and autoimmune conditions.  Hepatitis C  Blood testing is recommended for: ? Everyone born from [birth year] through 1963. ? Anyone with known risk factors for hepatitis C.  Sexually transmitted infections (STIs)  You should be screened for sexually transmitted infections (STIs) including gonorrhea and chlamydia if: ? You are sexually active and are younger than 69 years of age. ? You are older than 69 years of age and your health care provider tells  you that you are at risk for this type of infection. ? Your sexual activity has changed since you were last screened and you are at an increased risk for chlamydia or gonorrhea. Ask your health care provider if you are at risk.  If you do not have HIV, but are at risk, it may be recommended that you take a prescription medicine daily to prevent HIV infection. This is called pre-exposure prophylaxis (PrEP). You are considered at risk if: ? You are sexually active and do not regularly use condoms or know  the HIV status of your partner(s). ? You take drugs by injection. ? You are sexually active with a partner who has HIV.  Talk with your health care provider about whether you are at high risk of being infected with HIV. If you choose to begin PrEP, you should first be tested for HIV. You should then be tested every 3 months for as long as you are taking PrEP. Pregnancy  If you are premenopausal and you may become pregnant, ask your health care provider about preconception counseling.  If you may become pregnant, take 400 to 800 micrograms (mcg) of folic acid every day.  If you want to prevent pregnancy, talk to your health care provider about birth control (contraception). Osteoporosis and menopause  Osteoporosis is a disease in which the bones lose minerals and strength with aging. This can result in serious bone fractures. Your risk for osteoporosis can be identified using a bone density scan.  If you are 34 years of age or older, or if you are at risk for osteoporosis and fractures, ask your health care provider if you should be screened.  Ask your health care provider whether you should take a calcium or vitamin D supplement to lower your risk for osteoporosis.  Menopause may have certain physical symptoms and risks.  Hormone replacement therapy may reduce some of these symptoms and risks. Talk to your health care provider about whether hormone replacement therapy is right for  you. Follow these instructions at home:  Schedule regular health, dental, and eye exams.  Stay current with your immunizations.  Do not use any tobacco products including cigarettes, chewing tobacco, or electronic cigarettes.  If you are pregnant, do not drink alcohol.  If you are breastfeeding, limit how much and how often you drink alcohol.  Limit alcohol intake to no more than 1 drink per day for nonpregnant women. One drink equals 12 ounces of beer, 5 ounces of wine, or 1 ounces of hard liquor.  Do not use street drugs.  Do not share needles.  Ask your health care provider for help if you need support or information about quitting drugs.  Tell your health care provider if you often feel depressed.  Tell your health care provider if you have ever been abused or do not feel safe at home. This information is not intended to replace advice given to you by your health care provider. Make sure you discuss any questions you have with your health care provider. Document Released: 04/21/2011 Document Revised: 03/13/2016 Document Reviewed: 07/10/2015 Elsevier Interactive Patient Education  2018 [employer].   Steps to Quit Smoking Smoking tobacco can be bad for your health. It can also affect almost every organ in your body. Smoking puts you and people around you at risk for many serious long-lasting (chronic) diseases. Quitting smoking is hard, but it is one of the best things that you can do for your health. It is never too late to quit. What are the benefits of quitting smoking? When you quit smoking, you lower your risk for getting serious diseases and conditions. They can include:  Lung cancer or lung disease.  Heart disease.  Stroke.  Heart attack.  Not being able to have children (infertility).  Weak bones (osteoporosis) and broken bones (fractures).  If you have coughing, wheezing, and shortness of breath, those symptoms may get better when you quit. You may also  get sick less often. If you are pregnant, quitting smoking can help to lower your chances of  having a baby of low birth weight. What can I do to help me quit smoking? Talk with your doctor about what can help you quit smoking. Some things you can do (strategies) include:  Quitting smoking totally, instead of slowly cutting back how much you smoke over a period of time.  Going to in-person counseling. You are more likely to quit if you go to many counseling sessions.  Using resources and support systems, such as: ? Database administrator with a Social worker. ? Phone quitlines. ? Careers information officer. ? Support groups or group counseling. ? Text messaging programs. ? Mobile phone apps or applications.  Taking medicines. Some of these medicines may have nicotine in them. If you are pregnant or breastfeeding, do not take any medicines to quit smoking unless your doctor says it is okay. Talk with your doctor about counseling or other things that can help you.  Talk with your doctor about using more than one strategy at the same time, such as taking medicines while you are also going to in-person counseling. This can help make quitting easier. What things can I do to make it easier to quit? Quitting smoking might feel very hard at first, but there is a lot that you can do to make it easier. Take these steps:  Talk to your family and friends. Ask them to support and encourage you.  Call phone quitlines, reach out to support groups, or work with a Social worker.  Ask people who smoke to not smoke around you.  Avoid places that make you want (trigger) to smoke, such as: ? Bars. ? Parties. ? Smoke-break areas at work.  Spend time with people who do not smoke.  Lower the stress in your life. Stress can make you want to smoke. Try these things to help your stress: ? Getting regular exercise. ? Deep-breathing exercises. ? Yoga. ? Meditating. ? Doing a body scan. To do this, close your eyes, focus on  one area of your body at a time from head to toe, and notice which parts of your body are tense. Try to relax the muscles in those areas.  Download or buy apps on your mobile phone or tablet that can help you stick to your quit plan. There are many free apps, such as QuitGuide from the [employer] Office manager for Disease Control and Prevention). You can find more support from smokefree.gov and other websites.  This information is not intended to replace advice given to you by your health care provider. Make sure you discuss any questions you have with your health care provider. Document Released: 08/02/2009 Document Revised: 06/03/2016 Document Reviewed: 02/20/2015 Elsevier Interactive Patient Education  2018 [employer] 65 Years and Older, Female Preventive care refers to lifestyle choices and visits with your health care provider that can promote health and wellness. What does preventive care include?  A yearly physical exam. This is also called an annual well check.  Dental exams once or twice a year.  Routine eye exams. Ask your health care provider how often you should have your eyes checked.  Personal lifestyle choices, including: ? Daily care of your teeth and gums. ? Regular physical activity. ? Eating a healthy diet. ? Avoiding tobacco and drug use. ? Limiting alcohol use. ? Practicing safe sex. ? Taking low-dose aspirin every day. ? Taking vitamin and mineral supplements as recommended by your health care provider. What happens during an annual well check? The services and screenings done by your health care  provider during your annual well check will depend on your age, overall health, lifestyle risk factors, and family history of disease. Counseling Your health care provider may ask you questions about your:  Alcohol use.  Tobacco use.  Drug use.  Emotional well-being.  Home and relationship well-being.  Sexual activity.  Eating habits.  History of  falls.  Memory and ability to understand (cognition).  Work and work Statistician.  Reproductive health.  Screening You may have the following tests or measurements:  Height, weight, and BMI.  Blood pressure.  Lipid and cholesterol levels. These may be checked every 5 years, or more frequently if you are over 66 years old.  Skin check.  Lung cancer screening. You may have this screening every year starting at age 58 if you have a 30-pack-year history of smoking and currently smoke or have quit within the past 15 years.  Fecal occult blood test (FOBT) of the stool. You may have this test every year starting at age 56.  Flexible sigmoidoscopy or colonoscopy. You may have a sigmoidoscopy every 5 years or a colonoscopy every 10 years starting at age 29.  Hepatitis C blood test.  Hepatitis B blood test.  Sexually transmitted disease (STD) testing.  Diabetes screening. This is done by checking your blood sugar (glucose) after you have not eaten for a while (fasting). You may have this done every 1-3 years.  Bone density scan. This is done to screen for osteoporosis. You may have this done starting at age 39.  Mammogram. This may be done every 1-2 years. Talk to your health care provider about how often you should have regular mammograms.  Talk with your health care provider about your test results, treatment options, and if necessary, the need for more tests. Vaccines Your health care provider may recommend certain vaccines, such as:  Influenza vaccine. This is recommended every year.  Tetanus, diphtheria, and acellular pertussis (Tdap, Td) vaccine. You may need a Td booster every 10 years.  Varicella vaccine. You may need this if you have not been vaccinated.  Zoster vaccine. You may need this after age 8.  Measles, mumps, and rubella (MMR) vaccine. You may need at least one dose of MMR if you were born in 1957 or later. You may also need a second dose.  Pneumococcal  13-valent conjugate (PCV13) vaccine. One dose is recommended after age 63.  Pneumococcal polysaccharide (PPSV23) vaccine. One dose is recommended after age 84.  Meningococcal vaccine. You may need this if you have certain conditions.  Hepatitis A vaccine. You may need this if you have certain conditions or if you travel or work in places where you may be exposed to hepatitis A.  Hepatitis B vaccine. You may need this if you have certain conditions or if you travel or work in places where you may be exposed to hepatitis B.  Haemophilus influenzae type b (Hib) vaccine. You may need this if you have certain conditions.  Talk to your health care provider about which screenings and vaccines you need and how often you need them. This information is not intended to replace advice given to you by your health care provider. Make sure you discuss any questions you have with your health care provider. Document Released: 11/02/2015 Document Revised: 06/25/2016 Document Reviewed: 08/07/2015 Elsevier Interactive Patient Education  2017 [employer].

## 2017-04-29 NOTE — Progress Notes (Signed)
Patient presents to clinic today for annual exam.  Patient is fasting for labs. Patient is also scheduled for [place] today with Health Coach -- Sabas Sous, RN  Chronic Issues: Hypertension -- Patient is currently on a regimen of lopressor 25 mg BID and losartan 100 mg daily. Endorses taking medications as directed. Patient denies chest pain, palpitations, lightheadedness, dizziness, vision changes or frequent headaches.  BP Readings from Last 3 Encounters:  04/29/17 118/84  04/10/17 128/90  03/17/17 132/88   Hyperlipidemia -- Currently on regimen of atorvastatin 40 mg and fenofibrate 160 mg daily. Is taking as directed. Is watching portion sizes and recently started exercising more.   COPD -- Is taking Symbicort BID as directed with significant improvement in breathing. Albuterol use is on a rare basis now. Patient is still a smoker.   GERD -- Patient endorses well-controlled on current regimen. Denies breakthrough symptoms.   Tobacco Abuse Disorder -- Patient continues to smoke. Has > 30 pack-year smoking history. Is ready to quit at present. Would like to give Chantix another try. Has taken before without side effect but stopped due to not really being ready to quit smoking.   Health Maintenance: Colonoscopy -- Overdue. Asymptomatic. Patient declines screening with colonoscopy, cologuard or hemoccult.  Mammogram -- Overdue. Patient declines screening and breast examination. States that she does not need. States she does perform breast self-examination about once weekly without any abnormal findings.  PAP -- Overdue. Patient declines screening.  Bone Density -- up-to-date  Past Medical History:  Diagnosis Date  . Arthritis   . Cholelithiasis 2011   s/p cholescystectomy   . COPD (chronic obstructive pulmonary disease) (HCC)    no history of PFTs, only diagnosed by CXR, not on any inhalers because she has not had a PCP in over 2 years  . Emphysema 2010   dx'd/CXR  . GERD  (gastroesophageal reflux disease)   . History of heartburn   . History of renal cell carcinoma   . Hypercholesteremia    previously on pravastatin   . Hypertension    previously on Lisinopril/HCTZ  . Murmur    no  prior work-up  . Pneumonia 1980's   "walking"  . Renal cell carcinoma 01/2010   s/p right radical nephrectomy 01/2010,  followed by [facility]  . Shortness of breath 09/26/11   "lately all the time"    Past Surgical History:  Procedure Laterality Date  . APPENDECTOMY  1970's  . CARDIAC CATHETERIZATION  09/2011  . CHOLECYSTECTOMY  01/2010  . DIAGNOSTIC LAPAROSCOPY    . LEFT AND RIGHT HEART CATHETERIZATION WITH CORONARY ANGIOGRAM N/A 09/30/2011   Procedure: LEFT AND RIGHT HEART CATHETERIZATION WITH CORONARY ANGIOGRAM;  Surgeon: Candee Furbish, MD;  Location: [facility] CATH LAB;  Service: Cardiovascular;  Laterality: N/A;  . MULTIPLE EXTRACTIONS WITH ALVEOLOPLASTY  10/06/2011   Procedure: MULTIPLE EXTRACION WITH ALVEOLOPLASTY;  Surgeon: Lenn Cal, DDS;  Location: [place];  Service: Oral Surgery;  Laterality: N/A;  Multiple extraction of tooth #'s 1, 6, 8, 10, 11, 22, 23, 26, 27, 28, 29 with alveoloplasty and Upper right buccal exostoses reductions.  . NEPHRECTOMY RADICAL  01/2010   right   . OVARIAN CYST REMOVAL  1970's   "went through belly button"  . TEE WITHOUT CARDIOVERSION  10/01/2011   Procedure: TRANSESOPHAGEAL ECHOCARDIOGRAM (TEE);  Surgeon: [name];  Location: [facility];  Service: Cardiovascular;  Laterality: N/A;  . TUBAL LIGATION  1970's  . US ECHOCARDIOGRAPHY  09/2011  Current Outpatient Prescriptions on File Prior to Visit  Medication Sig Dispense Refill  . albuterol (PROVENTIL HFA;VENTOLIN HFA) 108 (90 Base) MCG/ACT inhaler Inhale 1-2 puffs into the lungs every 6 (six) hours as needed for wheezing or shortness of breath. 3 Inhaler 1  . albuterol (PROVENTIL) (2.5 MG/3ML) 0.083% nebulizer solution Take 3 mLs (2.5 mg total) by nebulization  every 6 (six) hours as needed for wheezing or shortness of breath. 150 mL 1  . atorvastatin (LIPITOR) 40 MG tablet Take 1 tablet (40 mg total) by mouth daily at 6 PM. 30 tablet 1  . budesonide-formoterol (SYMBICORT) 160-4.5 MCG/ACT inhaler Inhale 2 puffs into the lungs 2 (two) times daily. 3 Inhaler 1  . fenofibrate 160 MG tablet Take 1 tablet (160 mg total) by mouth daily. 90 tablet 1  . losartan (COZAAR) 100 MG tablet Take 1 tablet (100 mg total) by mouth daily. 90 tablet 1  . metoprolol tartrate (LOPRESSOR) 25 MG tablet Take 1 tablet (25 mg total) by mouth 2 (two) times daily. 180 tablet 1   No current facility-administered medications on file prior to visit.     Allergies  Allergen Reactions  . Ace Inhibitors     Pseudoasthma/ renal failure    Family History  Problem Relation Age of Onset  . COPD Mother        DECEASED/SMOKED  . Diabetes Brother   . Diabetes Brother     Social History   Social History  . Marital status: Married    Spouse name: N/A  . Number of children: 3  . Years of education: 12   Occupational History  . [employer]   Social History Main Topics  . Smoking status: Current Every Day Smoker    Packs/day: 1.50    Years: 53.00    Types: Cigarettes  . Smokeless tobacco: Never Used  . Alcohol use 0.0 oz/week     Comment:  "drink very rarely"  . Drug use: No     Comment: hx of cocaine last in 2006  . Sexual activity: No   Other Topics Concern  . Not on file   Social History Narrative   Lives in [place] with her husband.    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    Patient has 3 grown children that do not live with her.    Patient does not have any medical insurance.   Review of Systems  Constitutional: Negative for fever and weight loss.  HENT: Negative for ear discharge, ear pain, hearing loss and tinnitus.   Eyes: Negative for blurred vision, double  vision, photophobia and pain.  Respiratory: Negative for cough and shortness of breath.   Cardiovascular: Negative for chest pain and palpitations.  Gastrointestinal: Negative for abdominal pain, blood in stool, constipation, diarrhea, heartburn, melena, nausea and vomiting.  Genitourinary: Negative for dysuria, flank pain, frequency, hematuria and urgency.  Musculoskeletal: Negative for falls.  Neurological: Negative for dizziness, loss of consciousness and headaches.  Endo/Heme/Allergies: Negative for environmental allergies.  Psychiatric/Behavioral: Negative for depression, hallucinations, substance abuse and suicidal ideas. The patient is not nervous/anxious and does not have insomnia.     BP 118/84   Pulse 66   Temp 98.5 F (36.9 C) (Oral)   Resp 14   Ht 5\' 1"  (1.549 m)   Wt 140 lb (63.5 kg)   SpO2 97%   BMI 26.45 kg/m   Physical Exam  Constitutional:  She is oriented to person, place, and time and well-developed, well-nourished, and in no distress.  HENT:  Head: Normocephalic and atraumatic.  Right Ear: External ear normal.  Left Ear: External ear normal.  Nose: Nose normal.  Mouth/Throat: Oropharynx is clear and moist. No oropharyngeal exudate.  TM within normal limits bilaterally.  Eyes: Conjunctivae are normal.  Neck: Neck supple.  Cardiovascular: Normal rate, regular rhythm, normal heart sounds and intact distal pulses.   Pulmonary/Chest: Effort normal and breath sounds normal. No respiratory distress. She has no wheezes. She has no rales. She exhibits no tenderness.  Declines breast and GU examination  Abdominal: Soft. Bowel sounds are normal. She exhibits no distension and no mass. There is no tenderness. There is no rebound and no guarding.  Lymphadenopathy:    She has no cervical adenopathy.  Neurological: She is alert and oriented to person, place, and time.  Skin: Skin is warm and dry. No rash noted.  Psychiatric: Affect normal.  Vitals reviewed.   Recent  Results (from the past 2160 hour(s))  POCT Urinalysis Dipstick     Status: Normal   Collection Time: 03/10/17 11:32 AM  Result Value Ref Range   Color, UA yellow    Clarity, UA clear    Glucose, UA negative    Bilirubin, UA negative    Ketones, UA negative    Spec Grav, UA 1.020 1.010 - 1.025   Blood, UA negative    pH, UA 5.0 5.0 - 8.0   Protein, UA negative    Urobilinogen, UA 0.2 0.2 or 1.0 E.U./dL   Nitrite, UA negative    Leukocytes, UA Negative Negative  Urine culture     Status: None   Collection Time: 03/10/17 11:35 AM  Result Value Ref Range   Organism ID, Bacteria NO GROWTH   CBC with Differential/Platelet     Status: None   Collection Time: 04/29/17 11:22 AM  Result Value Ref Range   WBC 6.8 4.0 - 10.5 K/uL   RBC 4.69 3.87 - 5.11 Mil/uL   Hemoglobin 13.8 12.0 - 15.0 g/dL   HCT 40.8 36.0 - 46.0 %   MCV 86.8 78.0 - 100.0 fl   MCHC 33.8 30.0 - 36.0 g/dL   RDW 13.5 11.5 - 15.5 %   Platelets 314.0 150.0 - 400.0 K/uL   Neutrophils Relative % 58.0 43.0 - 77.0 %   Lymphocytes Relative 30.9 12.0 - 46.0 %   Monocytes Relative 7.0 3.0 - 12.0 %   Eosinophils Relative 2.9 0.0 - 5.0 %   Basophils Relative 1.2 0.0 - 3.0 %   Neutro Abs 3.9 1.4 - 7.7 K/uL   Lymphs Abs 2.1 0.7 - 4.0 K/uL   Monocytes Absolute 0.5 0.1 - 1.0 K/uL   Eosinophils Absolute 0.2 0.0 - 0.7 K/uL   Basophils Absolute 0.1 0.0 - 0.1 K/uL  Comprehensive metabolic panel     Status: Abnormal   Collection Time: 04/29/17 11:22 AM  Result Value Ref Range   Sodium 137 135 - 145 mEq/L   Potassium 5.0 3.5 - 5.1 mEq/L   Chloride 102 96 - 112 mEq/L   CO2 27 19 - 32 mEq/L   Glucose, Bld 88 70 - 99 mg/dL   BUN 23 6 - 23 mg/dL   Creatinine, Ser 1.42 (H) 0.40 - 1.20 mg/dL   Total Bilirubin 0.5 0.2 - 1.2 mg/dL   Alkaline Phosphatase 84 39 - 117 U/L   AST 17 0 - 37 U/L   ALT 13 0 -  35 U/L   Total Protein 7.4 6.0 - 8.3 g/dL   Albumin 4.3 3.5 - 5.2 g/dL   Calcium 10.6 (H) 8.4 - 10.5 mg/dL   GFR 38.95 (L) >60.00  mL/min  Lipid panel     Status: Abnormal   Collection Time: 04/29/17 11:22 AM  Result Value Ref Range   Cholesterol 256 (H) 0 - 200 mg/dL    Comment: ATP III Classification       Desirable:  < 200 mg/dL               Borderline High:  200 - 239 mg/dL          High:  > = 240 mg/dL   Triglycerides 324.0 (H) 0.0 - 149.0 mg/dL    Comment: Normal:  <150 mg/dLBorderline High:  150 - 199 mg/dL   HDL 70.10 >39.00 mg/dL   VLDL 64.8 (H) 0.0 - 40.0 mg/dL   Total CHOL/HDL Ratio 4     Comment:                Men          Women1/2 Average Risk     3.4          3.3Average Risk          5.0          4.42X Average Risk          9.6          7.13X Average Risk          15.0          11.0                       NonHDL 186.03     Comment: NOTE:  Non-HDL goal should be 30 mg/dL higher than patient's LDL goal (i.e. LDL goal of < 70 mg/dL, would have non-HDL goal of < 100 mg/dL)  TSH     Status: None   Collection Time: 04/29/17 11:22 AM  Result Value Ref Range   TSH 1.59 0.35 - 4.50 uIU/mL  Urinalysis, Routine w reflex microscopic     Status: None   Collection Time: 04/29/17 11:22 AM  Result Value Ref Range   Color, Urine YELLOW Yellow;Lt. Yellow   APPearance CLEAR Clear   Specific Gravity, Urine 1.015 1.000 - 1.030   pH 6.0 5.0 - 8.0   Total Protein, Urine NEGATIVE Negative   Urine Glucose NEGATIVE Negative   Ketones, ur NEGATIVE Negative   Bilirubin Urine NEGATIVE Negative   Hgb urine dipstick NEGATIVE Negative   Urobilinogen, UA 0.2 0.0 - 1.0   Leukocytes, UA NEGATIVE Negative   Nitrite NEGATIVE Negative   WBC, UA 0-2/hpf 0-2/hpf   RBC / HPF none seen 0-2/hpf   Squamous Epithelial / LPF Rare(0-4/hpf) Rare(0-4/hpf)  LDL cholesterol, direct     Status: None   Collection Time: 04/29/17 11:22 AM  Result Value Ref Range   Direct LDL 121.0 mg/dL    Comment: Optimal:  <100 mg/dLNear or Above Optimal:  100-129 mg/dLBorderline High:  130-159 mg/dLHigh:  160-189 mg/dLVery High:  >190 mg/dL     Assessment/Plan: COPD GOLD O  Doing very well with breathing at present. Continue Symbicort BID and albuterol PRN. Patient to start Chantix for smoking cessation. Encouraged patient to follow-up with Dr. Melvyn Novas with Pulmonology. Low-dose CT will be ordered for lung cancer screening.  Breast cancer screening Patient declines breast examination or mammogram AMA.  Esophageal  reflux Stable without breakthrough symptoms. Continue current regimen.   Essential hypertension BP stable. Labs today. Continue current regimen.   Tobacco abuse disorder Will restart Chantix. Follow-up scheduled. Low dose CT for lung cancer screen ordered.   Visit for preventive health examination Depression screen negative. Health Maintenance reviewed -- Declines colon cancer screening, breast cancer screening and PAP. Preventive schedule discussed and handout given in AVS. Will obtain fasting labs today.     [name], PA-C

## 2017-04-29 NOTE — Progress Notes (Signed)
Pre visit review using our clinic review tool, if applicable. No additional management support is needed unless otherwise documented below in the visit note. 

## 2017-04-30 ENCOUNTER — Other Ambulatory Visit: Payer: Self-pay | Admitting: Physician Assistant

## 2017-04-30 DIAGNOSIS — Z0000 Encounter for general adult medical examination without abnormal findings: Secondary | ICD-10-CM | POA: Insufficient documentation

## 2017-04-30 DIAGNOSIS — R7989 Other specified abnormal findings of blood chemistry: Secondary | ICD-10-CM

## 2017-04-30 NOTE — Assessment & Plan Note (Signed)
Doing very well with breathing at present. Continue Symbicort BID and albuterol PRN. Patient to start Chantix for smoking cessation. Encouraged patient to follow-up with Dr. Melvyn Novas with Pulmonology. Low-dose CT will be ordered for lung cancer screening.

## 2017-04-30 NOTE — Assessment & Plan Note (Signed)
Patient declines breast examination or mammogram AMA.

## 2017-04-30 NOTE — Assessment & Plan Note (Signed)
BP stable. Labs today. Continue current regimen.

## 2017-04-30 NOTE — Progress Notes (Signed)
RN MWV note reviewed.  Katie Pizzini Cody, PA-C  

## 2017-04-30 NOTE — Assessment & Plan Note (Signed)
Depression screen negative. Health Maintenance reviewed -- Declines colon cancer screening, breast cancer screening and PAP. Preventive schedule discussed and handout given in AVS. Will obtain fasting labs today.

## 2017-04-30 NOTE — Assessment & Plan Note (Addendum)
Will restart Chantix. Follow-up scheduled. Low dose CT for lung cancer screen ordered.

## 2017-04-30 NOTE — Assessment & Plan Note (Signed)
Stable without breakthrough symptoms. Continue current regimen.

## 2017-05-06 ENCOUNTER — Ambulatory Visit: Admission: RE | Admit: 2017-05-06 | Payer: Self-pay | Source: Ambulatory Visit

## 2017-05-06 ENCOUNTER — Ambulatory Visit (INDEPENDENT_AMBULATORY_CARE_PROVIDER_SITE_OTHER): Payer: Medicare Other | Admitting: Acute Care

## 2017-05-06 ENCOUNTER — Encounter: Payer: Self-pay | Admitting: Acute Care

## 2017-05-06 DIAGNOSIS — F17210 Nicotine dependence, cigarettes, uncomplicated: Secondary | ICD-10-CM

## 2017-05-06 NOTE — Progress Notes (Signed)
Shared Decision Making Visit Lung Cancer Screening Program [phone])   Eligibility:  Age 71 y.o.  Pack Years Smoking History Calculation 79.5 pack year smoking history (# packs/per year x # years smoked)  Recent History of coughing up blood  no  Unexplained weight loss? no ( >Than 15 pounds within the last 6 months )  Prior History Lung / other cancer no (Diagnosis within the last 5 years already requiring surveillance chest CT Scans).  Smoking Status Current Smoker  Former Smokers: Years since quit: NA  Quit Date: NA  Visit Components:  Discussion included one or more decision making aids. yes  Discussion included risk/benefits of screening. yes  Discussion included potential follow up diagnostic testing for abnormal scans. yes  Discussion included meaning and risk of over diagnosis. yes  Discussion included meaning and risk of False Positives. yes  Discussion included meaning of total radiation exposure. yes  Counseling Included:  Importance of adherence to annual lung cancer LDCT screening. yes  Impact of comorbidities on ability to participate in the program. yes  Ability and willingness to under diagnostic treatment. yes  Smoking Cessation Counseling:  Current Smokers:   Discussed importance of smoking cessation. yes  Information about tobacco cessation classes and interventions provided to patient. yes  Patient provided with "ticket" for LDCT Scan. yes  Symptomatic Patient. no  Counseling  Diagnosis Code: Tobacco Use Z72.0  Asymptomatic Patient yes  Counseling (Intermediate counseling: > three minutes counseling) O3500  Former Smokers:   Discussed the importance of maintaining cigarette abstinence. yes  Diagnosis Code: Personal History of Nicotine Dependence. X38.182  Information about tobacco cessation classes and interventions provided to patient. Yes  Patient provided with "ticket" for LDCT Scan. yes  Written Order for Lung Cancer  Screening with LDCT placed in Epic. Yes (CT Chest Lung Cancer Screening Low Dose W/O CM) [id number] Z12.2-Screening of respiratory organs Z87.891-Personal history of nicotine dependence  I have spent 25 minutes of face to face time with Katie [name] discussing the risks and benefits of lung cancer screening. We viewed a power point together that explained in detail the above noted topics. We paused at intervals to allow for questions to be asked and answered to ensure understanding.We discussed that the single most powerful action that she can take to decrease her risk of developing lung cancer is to quit smoking. We discussed whether or not she is ready to commit to setting a quit date. We discussed options for tools to aid in quitting smoking including nicotine replacement therapy, non-nicotine medications, support groups, Quit Smart classes, and behavior modification. We discussed that often times setting smaller, more achievable goals, such as eliminating 1 cigarette a day for a week and then 2 cigarettes a day for a week can be helpful in slowly decreasing the number of cigarettes smoked. This allows for a sense of accomplishment as well as providing a clinical benefit. I gave her the " Be Stronger Than Your Excuses" card with contact information for community resources, classes, free nicotine replacement therapy, and access to mobile apps, text messaging, and on-line smoking cessation help. I have also given her my card and contact information in the event she needs to contact me. We discussed the time and location of the scan, and that either Doroteo Glassman RN or I will call with the results within 24-48 hours of receiving them. I have offered Katie [name]  a copy of the power point we viewed  as a resource in the event they need reinforcement  of the concepts we discussed today in the office. The patient verbalized understanding of all of  the above and had no further questions upon leaving the office.  They have my contact information in the event they have any further questions.  I spent 5 minutes counseling on smoking cessation and the health risks of continued tobacco abuse.  I explained to the patient that there has been a high incidence of coronary artery disease noted on these exams. I explained that this is a non-gated exam therefore degree or severity cannot be determined. This patient is currently on statin therapy. I have asked the patient to follow-up with their PCP regarding any incidental finding of coronary artery disease and management with diet or medication as their PCP  feels is clinically indicated. The patient verbalized understanding of the above and had no further questions upon completion of the visit.      Magdalen Spatz, NP 05/06/2017

## 2017-05-11 ENCOUNTER — Telehealth: Payer: Self-pay | Admitting: Acute Care

## 2017-05-11 ENCOUNTER — Telehealth: Payer: Self-pay

## 2017-05-11 ENCOUNTER — Ambulatory Visit (INDEPENDENT_AMBULATORY_CARE_PROVIDER_SITE_OTHER)
Admission: RE | Admit: 2017-05-11 | Discharge: 2017-05-11 | Disposition: A | Discharge status: Home / Self Care | Payer: Medicare Other | Source: Ambulatory Visit | Attending: Acute Care | Admitting: Acute Care

## 2017-05-11 DIAGNOSIS — Z87891 Personal history of nicotine dependence: Secondary | ICD-10-CM | POA: Diagnosis not present

## 2017-05-11 DIAGNOSIS — F17210 Nicotine dependence, cigarettes, uncomplicated: Principal | ICD-10-CM

## 2017-05-11 NOTE — Telephone Encounter (Signed)
These results of been called to the patient. I explained her CT screening scan was read as a lung RADS 1. Lung RADS 1, negative study: no nodules or definitely benign nodules. Radiology recommendation is for a repeat LDCT in 12 months. I told her we will order and schedule follow-up screening scan in July 2019. I also let her know that there was notation of an enlarged abdominal aorta. I explained that the radiologist recommended minimally an ultrasound of her abdomen and possibly a CT of her abdomen to fully evaluate. I will call her PCP Dr. [name] at [facility] and make sure he is aware in addition we will fax the results of this scan to him for follow-up as he feels is clinically appropriate. Langley Gauss please place order for annual screening scan for July 2019. Thank you

## 2017-05-11 NOTE — Telephone Encounter (Signed)
Routing to lung nodule pool for follow up.  

## 2017-05-11 NOTE — Telephone Encounter (Signed)
Spoke with Roselyn Reef at [facility], calling with a call report on Low Dose CT performed today.  Full report available in Epic, impression copied below:  IMPRESSION: 1. Lung-RADS 1, negative. Continue annual screening with low-dose chest CT without contrast in 12 months. 2. The "S" modifier represents a potentially clinically significant non pulmonary finding. Aortic dilatation, incompletely imaged. Consider at minimum aortic ultrasound versus more complete characterization with abdominal CTA. 3. Coronary artery atherosclerosis. Aortic Atherosclerosis (ICD10-I70.0). 4.  Emphysema (ICD10-J43.9). These results will be called to the ordering clinician or representative by the Radiologist Assistant, and communication documented in the PACS or zVision Dashboard.  SG please advise.  Thanks!

## 2017-05-11 NOTE — Telephone Encounter (Signed)
I have called Dr. [name] with the results of Katie [name]'s low-dose screening CT.  I explained that her CT chest was fine lung RADS 1 recommendation for annual screening in 12 months. We did discuss the notation of an incidental finding of an aortic dilation that was incompletely imaged measuring 3.3 x 2.8 cm. I explained that the radiology recommended ultrasound of the abdomen or a CT abdomen. Dr. Jimmye Norman is going to follow-up with the patient and schedule her for the appropriate exam,. The patient is aware of this result, and is aware that I was going to talk with her primary care physician regarding follow-up.

## 2017-05-12 ENCOUNTER — Telehealth: Payer: Self-pay | Admitting: Physician Assistant

## 2017-05-12 ENCOUNTER — Other Ambulatory Visit: Payer: Self-pay | Admitting: Acute Care

## 2017-05-12 DIAGNOSIS — I714 Abdominal aortic aneurysm, without rupture, unspecified: Secondary | ICD-10-CM

## 2017-05-12 DIAGNOSIS — F17210 Nicotine dependence, cigarettes, uncomplicated: Secondary | ICD-10-CM

## 2017-05-12 NOTE — Telephone Encounter (Signed)
Spoke with patient. [place] ordered. Patient informed. Will call with results.

## 2017-05-12 NOTE — Telephone Encounter (Signed)
Discussed case with Eric Form. An Order for Ultrasound has been placed to further assess this aortic dilation in the abdomen. Trying to avoid CTA giving she has one kidney. Please call patient to let her know that we have ordered further screening.

## 2017-05-12 NOTE — Telephone Encounter (Signed)
-----   Message from Magdalen Spatz, NP sent at 05/11/2017  3:05 PM EDT ----- Please see the results of the above low-dose screening CT. CT scan itself was read as a Lung RADS 1, negative study: no nodules or definitely benign nodules. Radiology recommendation is for a repeat LDCT in 12 months. We will schedule an annual screening scan for July 2019. Please note the incidental finding of an enlarged abdominal aorta . Radiology recommends minimally and ultrasound of the abdomen, or a CT of the abdomen. I have notified the patient this result. I have let her know that I will contact you with this information. Please follow up regarding the ultrasound or CTA as you feel is clinically indicated at this point. Please do not hesitate to contact the office with any questions or concerns regarding the results of the scan. Thank you

## 2017-05-13 ENCOUNTER — Other Ambulatory Visit: Payer: Self-pay | Admitting: Physician Assistant

## 2017-05-13 ENCOUNTER — Ambulatory Visit (HOSPITAL_BASED_OUTPATIENT_CLINIC_OR_DEPARTMENT_OTHER)
Admission: RE | Admit: 2017-05-13 | Discharge: 2017-05-13 | Disposition: A | Discharge status: Home / Self Care | Payer: Medicare Other | Source: Ambulatory Visit | Attending: Physician Assistant | Admitting: Physician Assistant

## 2017-05-13 DIAGNOSIS — I714 Abdominal aortic aneurysm, without rupture, unspecified: Secondary | ICD-10-CM

## 2017-05-14 ENCOUNTER — Telehealth: Payer: Self-pay | Admitting: Physician Assistant

## 2017-05-14 ENCOUNTER — Other Ambulatory Visit: Payer: Self-pay | Admitting: Physician Assistant

## 2017-05-14 DIAGNOSIS — I714 Abdominal aortic aneurysm, without rupture, unspecified: Secondary | ICD-10-CM

## 2017-05-14 NOTE — Telephone Encounter (Signed)
Pt asking for results on [place] she had done yesterday.

## 2017-05-14 NOTE — Telephone Encounter (Signed)
Advised patient of [place] results. Referral placed for Vascular surgery. She is agreeable

## 2017-05-15 ENCOUNTER — Telehealth: Payer: Self-pay | Admitting: Physician Assistant

## 2017-05-15 NOTE — Telephone Encounter (Signed)
Spoke with patient and she has tried Tums for indigestion with no relief. Advised trial of Zantac for indigestion. She is agreeable and will start and let us know if symptoms doesn't improve.

## 2017-05-15 NOTE — Telephone Encounter (Signed)
Patient requesting medication for indigestion, states it has been bothering her the last few days.

## 2017-05-18 ENCOUNTER — Telehealth: Payer: Self-pay | Admitting: Physician Assistant

## 2017-05-18 NOTE — Telephone Encounter (Signed)
Discussed questions with patient. She voices understanding and agreement. Denies further questions.

## 2017-05-18 NOTE — Telephone Encounter (Signed)
Pt has some more questions regarding the [place] that she had done last week and would like a call back.

## 2017-05-25 NOTE — Progress Notes (Deleted)
Office Visit Note  Patient: Katie [name]             Date of Birth: November 25, 1945           MRN: [id number]             PCP: Delorse Limber Referring: Brunetta Jeans, PA-C Visit Date: 05/28/2017 Occupation: @GUAROCC @    Subjective:  No chief complaint on file.   History of Present Illness: Katie [name] is a 71 y.o. female ***   Activities of Daily Living:  Patient reports morning stiffness for *** {minute/hour:19697}.   Patient {ACTIONS;DENIES/REPORTS:21021675::"Denies"} nocturnal pain.  Difficulty dressing/grooming: {ACTIONS;DENIES/REPORTS:21021675::"Denies"} Difficulty climbing stairs: {ACTIONS;DENIES/REPORTS:21021675::"Denies"} Difficulty getting out of chair: {ACTIONS;DENIES/REPORTS:21021675::"Denies"} Difficulty using hands for taps, buttons, cutlery, and/or writing: {ACTIONS;DENIES/REPORTS:21021675::"Denies"}   No Rheumatology ROS completed.   PMFS History:  Patient Active Problem List   Diagnosis Date Noted  . Visit for preventive health examination 04/30/2017  . Osteoporosis 12/31/2016  . Arthralgia of both hands 11/11/2016  . Breast cancer screening 11/11/2016  . Hyperglycemia 08/03/2015  . Esophageal reflux   . Depression with anxiety   . Tobacco abuse disorder 06/27/2014  . Insomnia 07/15/2013  . Mitral regurgitation due to cusp prolapse 09/29/2011  . COPD GOLD O  09/26/2011  . Essential hypertension 09/26/2011  . HLD (hyperlipidemia) 09/26/2011  . S/P cholecystectomy 09/26/2011    Past Medical History:  Diagnosis Date  . Arthritis   . Cholelithiasis 2011   s/p cholescystectomy   . COPD (chronic obstructive pulmonary disease) (HCC)    no history of PFTs, only diagnosed by CXR, not on any inhalers because she has not had a PCP in over 2 years  . Emphysema 2010   dx'd/CXR  . GERD (gastroesophageal reflux disease)   . History of heartburn   . History of renal cell carcinoma   . Hypercholesteremia    previously on pravastatin   .  Hypertension    previously on Lisinopril/HCTZ  . Murmur    no  prior work-up  . Pneumonia 1980's   "walking"  . Renal cell carcinoma 01/2010   s/p right radical nephrectomy 01/2010,  [facility]  . Shortness of breath 09/26/11   "lately all the time"    Family History  Problem Relation Age of Onset  . COPD Mother        DECEASED/SMOKED  . Diabetes Brother   . Diabetes Brother    Past Surgical History:  Procedure Laterality Date  . APPENDECTOMY  1970's  . CARDIAC CATHETERIZATION  09/2011  . CHOLECYSTECTOMY  01/2010  . DIAGNOSTIC LAPAROSCOPY    . LEFT AND RIGHT HEART CATHETERIZATION WITH CORONARY ANGIOGRAM N/A 09/30/2011   Procedure: LEFT AND RIGHT HEART CATHETERIZATION WITH CORONARY ANGIOGRAM;  Surgeon: Candee Furbish, MD;  Location: [facility] CATH LAB;  Service: Cardiovascular;  Laterality: N/A;  . MULTIPLE EXTRACTIONS WITH ALVEOLOPLASTY  10/06/2011   Procedure: MULTIPLE EXTRACION WITH ALVEOLOPLASTY;  Surgeon: Lenn Cal, DDS;  Location: [place];  Service: Oral Surgery;  Laterality: N/A;  Multiple extraction of tooth #'s 1, 6, 8, 10, 11, 22, 23, 26, 27, 28, 29 with alveoloplasty and Upper right buccal exostoses reductions.  . NEPHRECTOMY RADICAL  01/2010   right   . OVARIAN CYST REMOVAL  1970's   "went through belly button"  . TEE WITHOUT CARDIOVERSION  10/01/2011   Procedure: TRANSESOPHAGEAL ECHOCARDIOGRAM (TEE);  Surgeon: [name];  Location: [place];  Service: Cardiovascular;  Laterality: N/A;  . TUBAL LIGATION  1970's  .  US ECHOCARDIOGRAPHY  09/2011   Social History   Social History Narrative   Lives in [place] with her husband.    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    Patient has 3 grown children that do not live with her.    Patient does not have any medical insurance.     Objective: Vital Signs: There were no vitals taken for this visit.   Physical Exam   Musculoskeletal Exam: ***  CDAI  Exam: No CDAI exam completed.    Investigation: Findings:  DEXA 12/01/2016 The BMD measured at Femur Neck Right is 0.622 g/cm2 with a T-score of -3.0. This patient is considered osteoporotic according to [facility]) criteria.     Imaging: US Abdominal Aorta Screening Aaa  Result Date: 05/13/2017 CLINICAL DATA:  Small abdominal aortic aneurysm by chest CT without contrast EXAM: US ABDOMINAL AORTA MEDICARE SCREENING TECHNIQUE: Ultrasound examination of the abdominal aorta was performed as a screening evaluation for abdominal aortic aneurysm. COMPARISON:  05/11/2017 FINDINGS: Abdominal aortic measurements as follows: Proximal:  2.5 cm Mid:  2.1 cm Distal:  3.2 cm Wall irregularity from abdominal aortic atherosclerosis. Aneurysmal dilatation of the distal aorta has a maximal diameter of 3.2 cm. Mural thrombus noted within the aneurysm. Aorta remains patent. IMPRESSION: 3.2 cm distal abdominal aortic aneurysm. Recommend followup by ultrasound in 3 years. This recommendation follows ACR consensus guidelines: White Paper of the ACR Incidental Findings Committee II on Vascular Findings. Nat[facility] Radiol 2013; [phone] Electronically Signed   By: Jerilynn Mages.  Shick M.D.   On: 05/13/2017 13:00   Ct Chest Lung Ca Screen Low Dose W/o Cm  Result Date: 05/11/2017 CLINICAL DATA:  Asymptomatic current smoker with 51 pack-year history. Right nephrectomy in 2011 for renal cell carcinoma. EXAM: CT CHEST WITHOUT CONTRAST LOW-DOSE FOR LUNG CANCER SCREENING TECHNIQUE: Multidetector CT imaging of the chest was performed following the standard protocol without IV contrast. COMPARISON:  Chest radiograph 05/26/2016.  No prior CT. FINDINGS: Cardiovascular: Aortic and branch vessel atherosclerosis. Tortuous thoracic aorta. Normal heart size, without pericardial effusion. Proximal LAD coronary artery atherosclerosis. Mediastinum/Nodes: No mediastinal or definite hilar adenopathy, given limitations of unenhanced  CT. Right hilar calcified node is consistent with old granulomatous disease. Lungs/Pleura: No pleural fluid.  Mild centrilobular emphysema. Lower lobe predominant bronchial wall thickening. Biapical pleural-parenchymal scarring. No suspicious pulmonary nodule or mass. Upper Abdomen: Dominant high right hepatic lobe 3.1 cm cyst. Cholecystectomy. Normal imaged portions of the spleen, stomach, pancreas, left adrenal gland, and left kidney. Surgical changes of right nephrectomy and possible adrenalectomy. Incompletely imaged abdominal aortic dilatation on the order of 3.3 x 2.8 cm on image 57/series 2. Musculoskeletal: Lower thoracic spondylosis. IMPRESSION: 1. Lung-RADS 1, negative. Continue annual screening with low-dose chest CT without contrast in 12 months. 2. The "S" modifier represents a potentially clinically significant non pulmonary finding. Aortic dilatation, incompletely imaged. Consider at minimum aortic ultrasound versus more complete characterization with abdominal CTA. 3. Coronary artery atherosclerosis. Aortic Atherosclerosis (ICD10-I70.0). 4.  Emphysema (ICD10-J43.9). These results will be called to the ordering clinician or representative by the Radiologist Assistant, and communication documented in the PACS or zVision Dashboard. Electronically Signed   By: Abigail Miyamoto M.D.   On: 05/11/2017 14:43    Speciality Comments: No specialty comments available.    Procedures:  No procedures performed Allergies: Ace inhibitors   Assessment / Plan:     Visit Diagnoses: Arthralgia of both hands  Age-related osteoporosis without current  pathological fracture  Primary insomnia  Tobacco abuse disorder  History of gastroesophageal reflux (GERD)  History of hyperglycemia  History of COPD  S/P cholecystectomy  History of mitral valve prolapse    Orders: No orders of the defined types were placed in this encounter.  No orders of the defined types were placed in this  encounter.   Face-to-face time spent with patient was *** minutes. 50% of time was spent in counseling and coordination of care.  Follow-Up Instructions: No Follow-up on file.   Amy Littrell, RT  Note - This record has been created using [employer].  Chart creation errors have been sought, but may not always  have been located. Such creation errors do not reflect on  the standard of medical care.

## 2017-05-26 ENCOUNTER — Encounter: Payer: Self-pay | Admitting: Vascular Surgery

## 2017-05-27 ENCOUNTER — Ambulatory Visit (INDEPENDENT_AMBULATORY_CARE_PROVIDER_SITE_OTHER): Payer: Medicare Other | Admitting: Vascular Surgery

## 2017-05-27 ENCOUNTER — Ambulatory Visit (HOSPITAL_COMMUNITY)
Admission: RE | Admit: 2017-05-27 | Discharge: 2017-05-27 | Disposition: A | Discharge status: Home / Self Care | Payer: Medicare Other | Source: Ambulatory Visit | Attending: Vascular Surgery | Admitting: Vascular Surgery

## 2017-05-27 ENCOUNTER — Encounter: Payer: Self-pay | Admitting: Vascular Surgery

## 2017-05-27 VITALS — BP 144/95 | HR 83 | Temp 98.3°F | Resp 16 | Ht 61.0 in | Wt 139.0 lb

## 2017-05-27 DIAGNOSIS — I6523 Occlusion and stenosis of bilateral carotid arteries: Secondary | ICD-10-CM | POA: Diagnosis not present

## 2017-05-27 DIAGNOSIS — I714 Abdominal aortic aneurysm, without rupture, unspecified: Secondary | ICD-10-CM

## 2017-05-27 LAB — VAS US CAROTID
LCCADDIAS: -24 cm/s
LCCAPSYS: 80 cm/s
LEFT ECA DIAS: -16 cm/s
LEFT VERTEBRAL DIAS: -13 cm/s
LICADDIAS: -29 cm/s
Left CCA dist sys: -66 cm/s
Left CCA prox dias: 23 cm/s
Left ICA dist sys: -69 cm/s
Left ICA prox dias: -18 cm/s
Left ICA prox sys: -49 cm/s
RIGHT CCA MID DIAS: -18 cm/s
RIGHT ECA DIAS: -11 cm/s
RIGHT VERTEBRAL DIAS: -14 cm/s
Right CCA prox dias: 18 cm/s
Right CCA prox sys: 73 cm/s
Right cca dist sys: -43 cm/s

## 2017-05-27 NOTE — Progress Notes (Signed)
Vitals:   05/27/17 0835  BP: (!) 151/101  Pulse: 83  Resp: 16  Temp: 98.3 F (36.8 C)  SpO2: 96%  Weight: 139 lb (63 kg)  Height: 5\' 1"  (1.549 m)

## 2017-05-27 NOTE — Progress Notes (Signed)
Patient name: Katie [name] MRN: [id number] DOB: 1945/12/26 Sex: female   REASON FOR CONSULT:    Abdominal aortic aneurysm. Bilateral carotid disease. The consult is requested by Dr. Hassell Done  HPI:   Katie [name] is a pleasant 71 y.o. female,  who underwent abdominal aortic aneurysm screening and was found to have a 3.2 cm infrarenal abdominal aortic aneurysm. The patient underwent a CT chest for lung cancer screening. It was noted that the aorta was somewhat dilated and for this reason abdominal aortic aneurysm screening was recommended. Of note CT scan was negative for evidence of lung cancer.  The patient does have some chronic back pain. She denies any new onset abdominal pain.  She denies any history of stroke, TIAs, expressive or receptive aphasia, or amaurosis fugax.  She has a history of leg pain associated with ambulation but has had a previous arterial workup which was unremarkable.  She is on a statin but does not take aspirin.  Past Medical History:  Diagnosis Date  . AAA (abdominal aortic aneurysm) ([place])   . Arthritis   . Cholelithiasis 2011   s/p cholescystectomy   . COPD (chronic obstructive pulmonary disease) (HCC)    no history of PFTs, only diagnosed by CXR, not on any inhalers because she has not had a PCP in over 2 years  . Emphysema 2010   dx'd/CXR  . GERD (gastroesophageal reflux disease)   . History of heartburn   . History of renal cell carcinoma   . Hypercholesteremia    previously on pravastatin   . Hypertension    previously on Lisinopril/HCTZ  . Murmur    no  prior work-up  . Pneumonia 1980's   "walking"  . Renal cell carcinoma 01/2010   s/p right radical nephrectomy 01/2010,  [facility]  . Shortness of breath 09/26/11   "lately all the time"    Family History  Problem Relation Age of Onset  . COPD Mother        DECEASED/SMOKED  . Diabetes Brother   . Diabetes Brother     SOCIAL HISTORY: Social History    Social History  . Marital status: Married    Spouse name: N/A  . Number of children: 3  . Years of education: 12   Occupational History  . [employer]   Social History Main Topics  . Smoking status: Current Every Day Smoker    Packs/day: 1.50    Years: 53.00    Types: Cigarettes  . Smokeless tobacco: Never Used     Comment: Resourse card and smoking cessatin classes information provided  . Alcohol use 0.0 oz/week     Comment:  "drink very rarely"  . Drug use: No     Comment: hx of cocaine last in 2006  . Sexual activity: No   Other Topics Concern  . Not on file   Social History Narrative   Lives in [place] with her husband.    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    Patient has 3 grown children that do not live with her.    Patient does not have any medical insurance.    Allergies  Allergen Reactions  . Ace Inhibitors     Pseudoasthma/ renal failure    Current Outpatient Prescriptions  Medication Sig Dispense Refill  . albuterol (PROVENTIL HFA;VENTOLIN HFA) 108 (90 Base) MCG/ACT inhaler Inhale 1-2 puffs into the lungs every  6 (six) hours as needed for wheezing or shortness of breath. 3 Inhaler 1  . albuterol (PROVENTIL) (2.5 MG/3ML) 0.083% nebulizer solution Take 3 mLs (2.5 mg total) by nebulization every 6 (six) hours as needed for wheezing or shortness of breath. 150 mL 1  . atorvastatin (LIPITOR) 40 MG tablet Take 1 tablet (40 mg total) by mouth daily at 6 PM. 30 tablet 1  . budesonide-formoterol (SYMBICORT) 160-4.5 MCG/ACT inhaler Inhale 2 puffs into the lungs 2 (two) times daily. 3 Inhaler 1  . fenofibrate 160 MG tablet Take 1 tablet (160 mg total) by mouth daily. 90 tablet 1  . metoprolol tartrate (LOPRESSOR) 25 MG tablet Take 1 tablet (25 mg total) by mouth 2 (two) times daily. 180 tablet 1  . losartan (COZAAR) 100 MG tablet Take 1 tablet (100 mg total) by mouth  daily. 90 tablet 1   No current facility-administered medications for this visit.     REVIEW OF SYSTEMS:  [X]  denotes positive finding, [ ]  denotes negative finding Cardiac  Comments:  Chest pain or chest pressure:    Shortness of breath upon exertion: X   Short of breath when lying flat:    Irregular heart rhythm:        Vascular    Pain in calf, thigh, or hip brought on by ambulation: X   Pain in feet at night that wakes you up from your sleep:     Blood clot in your veins:    Leg swelling:         Pulmonary    Oxygen at home:    Productive cough:     Wheezing:  X       Neurologic    Sudden weakness in arms or legs:     Sudden numbness in arms or legs:     Sudden onset of difficulty speaking or slurred speech:    Temporary loss of vision in one eye:     Problems with dizziness:         Gastrointestinal    Blood in stool:     Vomited blood:         Genitourinary    Burning when urinating:     Blood in urine:        Psychiatric    Major depression:         Hematologic    Bleeding problems:    Problems with blood clotting too easily:        Skin    Rashes or ulcers:        Constitutional    Fever or chills:     PHYSICAL EXAM:   Vitals:   05/27/17 0835 05/27/17 0839  BP: (!) 151/101 (!) 144/95  Pulse: 83 83  Resp: 16   Temp: 98.3 F (36.8 C)   SpO2: 96%   Weight: 139 lb (63 kg)   Height: 5\' 1"  (1.549 m)     GENERAL: The patient is a well-nourished female, in no acute distress. The vital signs are documented above. CARDIAC: There is a regular rate and rhythm.  VASCULAR: I do not detect carotid bruits. She has palpable femoral, popliteal, and dorsalis pedis pulses bilaterally. She has no significant lower extremity swelling. PULMONARY: There is good air exchange bilaterally without wheezing or rales. ABDOMEN: Soft and non-tender with normal pitched bowel sounds. I cannot palpate her abdominal aortic aneurysm. MUSCULOSKELETAL: There are no major  deformities or cyanosis. NEUROLOGIC: No focal weakness or paresthesias are detected. SKIN: There are  no ulcers or rashes noted. PSYCHIATRIC: The patient has a normal affect.  DATA:    DUPLEX ABDOMINAL AORTA: I reviewed the screening test that was done on 05/13/2017. Diameters of the aorta ranged from 2.1-3.2 cm.  LABS: Labs on 04/29/2017 show a creatinine of 1.42. GFR is 39.  CAROTID DUPLEX: I did find a carotid duplex scan that was done in July 2017 which showed a less than 49% right carotid stenosis and a 50-69% left carotid stenosis.  LOWER EXTREMITY ARTERIAL DOPPLER STUDY: I reviewed the lower extremity arterial Doppler study that was done on 05/26/2016. This showed triphasic Doppler signals in both feet with an ABI of 98% on the right and 100% on the left.  MEDICAL ISSUES:   ABDOMINAL AORTIC ANEURYSM: The patient has a 3.2 cm infrarenal abdominal aortic aneurysm by ultrasound. I have recommended a follow up ultrasound in 2 years. I've explained we would not consider elective repair of an aneurysm unless it reached 5.5 cm in maximum diameter. I have recommended a follow up ultrasound in 2 years. The patient plans on moving to [place] and therefore have written this information down for her so that she can arrange follow up when she gets to [place]. I have also explained that the 2 things that increase the risk of aneurysm expansion and potential rupture are tobacco use and poorly controlled blood pressure. We have had a long discussion about the importance of tobacco cessation.  BILATERAL CAROTID DISEASE:  This patient had a carotid duplex scan a year ago that showed no significant carotid disease on the right but a 50-69% left carotid stenosis. She is asymptomatic. We obtained a one-year follow up study today which shows A less than 39% stenosis bilaterally.   Of note she was not on aspirin. I have instructed her to begin taking 81 mg of aspirin daily. She is on a statin.  HYPERTENSION:  The patient's initial blood pressure today was elevated. We repeated this and this was still elevated. We have encouraged the patient to follow up with their primary care physician for management of their blood pressure. Of note she tells me that she did not take her blood pressure medicine this morning which may explain her elevated blood pressure here.   Deitra Mayo [facility] [phone]

## 2017-05-28 ENCOUNTER — Ambulatory Visit: Payer: Self-pay | Admitting: Rheumatology

## 2017-06-24 ENCOUNTER — Encounter: Payer: Self-pay | Admitting: Surgery

## 2017-07-01 ENCOUNTER — Ambulatory Visit: Payer: Self-pay | Admitting: Rheumatology

## 2017-07-28 ENCOUNTER — Telehealth: Payer: Self-pay | Admitting: Physician Assistant

## 2017-07-28 ENCOUNTER — Ambulatory Visit (HOSPITAL_COMMUNITY): Payer: Self-pay | Admitting: Psychiatry

## 2017-07-28 NOTE — Telephone Encounter (Signed)
Patient Name: Katie [name]  DOB: May 28, 1946    Initial Comment Caller has symptoms of copd and was away for 2 weeks and she states her breathing was ok. When she came home she found black mold in her home. Caller wants to know if mold can make her symptoms worse and if there is a way to test the mold.    Nurse Assessment  Nurse: Markus Daft, RN, Sherre Poot Date/Time (Eastern Time): 07/28/2017 8:41:10 AM  Confirm and document reason for call. If symptomatic, describe symptoms. ---Caller has symptoms of COPD which has been worse in last 2 days (she was away from her home for last 2 wks prior to this, and breathing was better). She is short of breath while talking to nurse now. When she came home, she found black mold in her home in the guest bedroom from a leaky air conditioner. It was cleaned by the apt manager yesterday. Caller wants to know if mold can make her symptoms worse and if there is a way to test the mold?  Does the patient have any new or worsening symptoms? ---Yes  Will a triage be completed? ---Yes  Related visit to physician within the last 2 weeks? ---No  Does the PT have any chronic conditions? (i.e. diabetes, asthma, etc.) ---Yes  List chronic conditions. ---COPD  Is this a behavioral health or substance abuse call? ---No     Guidelines    Guideline Title Affirmed Question Affirmed Notes  Cough - Chronic Difficulty breathing (Exception: no change from usual, chronic shortness of breath)    Final Disposition User   Go to ED Now (or PCP triage) Markus Daft, RN, [place]    Comments  RN advised, "I found mold growing in my home; how do I test the mold? Generally, it is not necessary to identify the species of mold growing in a residence, and CDC does not recommend routine sampling for molds. Current evidence indicates that allergies are the type of diseases most often associated with molds. Since the reaction of individuals can vary greatly either because of the persons susceptibility or type  and amount of mold present, sampling and culturing are not reliable in determining your health risk. If you are susceptible to mold and mold is seen or smelled, there is a potential health risk; therefore, no matter what type of mold is present, you should arrange for its removal. Furthermore, reliable sampling for mold can be expensive, and standards for judging what is and what is not an acceptable or tolerable quantity of mold have not been established. Top of Page A qualified environmental lab took samples of the mold in my home and gave me the results. Can CDC interpret these results? Standards for judging what is an acceptable, tolerable or normal quantity of mold have not been established. If you do decide to pay for environmental sampling for molds, before the work starts, you should ask the consultants who will do the work to establish criteria for interpreting the test results. They should tell you in advance what they will do or what recommendations they will make based on the sampling results. The results of samples taken in your unique situation cannot be interpreted without physical inspection of the contaminated area or without considering the buildings characteristics and the factors that led to the present condition." [email or web address]   Referrals  [facility] - ED   Caller Disagree/Comply Comply  Caller Understands Yes  PreDisposition Call Doctor

## 2017-08-20 ENCOUNTER — Ambulatory Visit: Payer: Self-pay | Admitting: Physician Assistant

## 2017-08-20 DIAGNOSIS — Z0289 Encounter for other administrative examinations: Secondary | ICD-10-CM

## 2017-08-28 ENCOUNTER — Telehealth: Payer: Self-pay | Admitting: *Deleted

## 2017-08-28 NOTE — Telephone Encounter (Signed)
Erroneous Encounter

## 2017-09-17 NOTE — Progress Notes (Deleted)
Cardiology Office Note   Date:  09/17/2017   ID:  Katie [name], DOB 02-01-1946, MRN [id number]  PCP:  Brunetta Jeans, PA-C  Cardiologist:   Skeet Latch, MD   No chief complaint on file.    History of Present Illness: Katie [name] is a 71 y.o. female with hypertension, severe mitral valve prolapse, mild mitral regurgitation, moderate carotid stenosis, CKD II, RCC s/p R nephrectomy in 2011, hyperlipidemia, COPD and prior tobacco abuse who presents for follow up.  She was hospitalized 03/2016 with chest pain and hypertensive urgency in the setting of not taking her antihypertensives.  Cardiac enzymes were negative.   She had an echo 04/18/16 that revealed LVEF 55-60% with grade 1 diastolic dysfunction.  There was severe prolapse of the posterior mitral valve leaflet but only trivial MR.  She had a Lexiscan Myoview that revealed a small, fixed defect at the basal septum consistent with HCM.  Her home HCTZ-losartan was restarted and metoprolol was added. She was also started on atorvastatin due to hyperlipidemia.    [name] followed up with her PCP, [name], PA-C, on 7/12 and her BP was 90/68 so her metoprolol was reduced to 12.5mg  bid. She was started on Chantix for smoking cessation. Since being discharged from the hospital she has multiple complaints.  She notes epigastric abdominal pain that is worse after eating.  Her appetite has been poor and she report losing 7 lb unintentionally.  She also reports emesis and fatigue.  She feels very lightheaded with positional changes but denies syncope.  She reports sharp pain in bilateral legs when walking short distances.  She had ABIs 05/26/16 that were negative for PAD.  [name] started Chantix one week ago and stopped smoking two days ago.  She notes that her cough has increased.  She also endorses headache and depressed mood.  She denies SI/HI and has not had any bad dreams.  She does not have any cravings for  cigarettes.   At her last appointment [name]'s blood pressure was running low so HCTZ was discontinued.    LDL 121 ?smoking   Past Medical History:  Diagnosis Date  . AAA (abdominal aortic aneurysm) ([place])   . Arthritis   . Cholelithiasis 2011   s/p cholescystectomy   . COPD (chronic obstructive pulmonary disease) (HCC)    no history of PFTs, only diagnosed by CXR, not on any inhalers because she has not had a PCP in over 2 years  . Emphysema 2010   dx'd/CXR  . GERD (gastroesophageal reflux disease)   . History of heartburn   . History of renal cell carcinoma   . Hypercholesteremia    previously on pravastatin   . Hypertension    previously on Lisinopril/HCTZ  . Murmur    no  prior work-up  . Pneumonia 1980's   "walking"  . Renal cell carcinoma 01/2010   s/p right radical nephrectomy 01/2010,  follow[facility]  . Shortness of breath 09/26/11   "lately all the time"    Past Surgical History:  Procedure Laterality Date  . APPENDECTOMY  1970's  . CARDIAC CATHETERIZATION  09/2011  . CHOLECYSTECTOMY  01/2010  . DIAGNOSTIC LAPAROSCOPY    . LEFT AND RIGHT HEART CATHETERIZATION WITH CORONARY ANGIOGRAM N/A 09/30/2011   Procedure: LEFT AND RIGHT HEART CATHETERIZATION WITH CORONARY ANGIOGRAM;  Surgeon: Candee Furbish, MD;  Location: [facility] CATH LAB;  Service: Cardiovascular;  Laterality: N/A;  . MULTIPLE EXTRACTIONS WITH ALVEOLOPLASTY  10/06/2011  Procedure: MULTIPLE EXTRACION WITH ALVEOLOPLASTY;  Surgeon: Lenn Cal, DDS;  Location: [place];  Service: Oral Surgery;  Laterality: N/A;  Multiple extraction of tooth #'s 1, 6, 8, 10, 11, 22, 23, 26, 27, 28, 29 with alveoloplasty and Upper right buccal exostoses reductions.  . NEPHRECTOMY RADICAL  01/2010   right   . OVARIAN CYST REMOVAL  1970's   "went through belly button"  . TEE WITHOUT CARDIOVERSION  10/01/2011   Procedure: TRANSESOPHAGEAL ECHOCARDIOGRAM (TEE);  Surgeon: [name];  Location: [place];  Service: Cardiovascular;  Laterality: N/A;  . TUBAL LIGATION  1970's  . US ECHOCARDIOGRAPHY  09/2011     Current Outpatient Medications  Medication Sig Dispense Refill  . albuterol (PROVENTIL HFA;VENTOLIN HFA) 108 (90 Base) MCG/ACT inhaler Inhale 1-2 puffs into the lungs every 6 (six) hours as needed for wheezing or shortness of breath. 3 Inhaler 1  . albuterol (PROVENTIL) (2.5 MG/3ML) 0.083% nebulizer solution Take 3 mLs (2.5 mg total) by nebulization every 6 (six) hours as needed for wheezing or shortness of breath. 150 mL 1  . atorvastatin (LIPITOR) 40 MG tablet Take 1 tablet (40 mg total) by mouth daily at 6 PM. 30 tablet 1  . budesonide-formoterol (SYMBICORT) 160-4.5 MCG/ACT inhaler Inhale 2 puffs into the lungs 2 (two) times daily. 3 Inhaler 1  . fenofibrate 160 MG tablet Take 1 tablet (160 mg total) by mouth daily. 90 tablet 1  . losartan (COZAAR) 100 MG tablet Take 1 tablet (100 mg total) by mouth daily. 90 tablet 1  . metoprolol tartrate (LOPRESSOR) 25 MG tablet Take 1 tablet (25 mg total) by mouth 2 (two) times daily. 180 tablet 1   No current facility-administered medications for this visit.     Allergies:   Ace inhibitors    Social History:  The patient  reports that she has been smoking cigarettes.  She has a 79.50 pack-year smoking history. she has never used smokeless tobacco. She reports that she drinks alcohol. She reports that she does not use drugs.   Family History:  The patient's family history includes COPD in her mother; Diabetes in her brother and brother.    ROS:  Please see the history of present illness.   Otherwise, review of systems are positive for none.   All other systems are reviewed and negative.    PHYSICAL EXAM: VS:  There were no vitals taken for this visit. , BMI There is no height or weight on file to calculate BMI. GENERAL:  Well appearing HEENT:  Pupils equal round and reactive, fundi not visualized, oral mucosa unremarkable NECK:   No jugular venous distention, waveform within normal limits, carotid upstroke brisk and symmetric, no bruits, no thyromegaly LYMPHATICS:  No cervical adenopathy LUNGS:  Clear to auscultation bilaterally HEART:  RRR.  PMI not displaced or sustained,S1 and S2 within normal limits, no S3, no S4, no clicks, no rubs, no murmurs ABD:  Flat, positive bowel sounds normal in frequency in pitch, no bruits, no rebound, +epigastric guarding, no midline pulsatile mass, no hepatomegaly, no splenomegaly.  + epigastric TTP. EXT:  2 plus pulses throughout, no edema, no cyanosis no clubbing SKIN:  No rashes no nodules NEURO:  Cranial nerves II through XII grossly intact, motor grossly intact throughout PSYCH:  Cognitively intact, oriented to person place and time   EKG:  EKG is ordered today. The ekg ordered today demonstrates sinus tachycardia rate 107 bpm.  LVH  Echo 04/18/16: Study Conclusions  - Left  ventricle: The cavity size was normal. Wall thickness was   increased in a pattern of mild LVH. There was focal basal   hypertrophy. Systolic function was normal. The estimated ejection   fraction was in the range of 55% to 60%. Wall motion was normal;   there were no regional wall motion abnormalities. Doppler   parameters are consistent with abnormal left ventricular   relaxation (grade 1 diastolic dysfunction).  Carotid Doppler 05/08/16: IMPRESSION: 1. Mild (1-49%) stenosis proximal right internal carotid artery secondary to heterogenous atherosclerotic plaque. 2. Moderate (50-69%) stenosis proximal left internal carotid artery secondary to heterogenous atherosclerotic plaque. 3. Vertebral arteries are patent with normal antegrade flow.  Recent Labs: 04/29/2017: ALT 13; BUN 23; Creatinine, Ser 1.42; Hemoglobin 13.8; Platelets 314.0; Potassium 5.0; Sodium 137; TSH 1.59    Lipid Panel    Component Value Date/Time   CHOL 256 (H) 04/29/2017 1122   TRIG 324.0 (H) 04/29/2017 1122   HDL 70.10  04/29/2017 1122   CHOLHDL 4 04/29/2017 1122   VLDL 64.8 (H) 04/29/2017 1122   LDLCALC 199 (H) 11/10/2016 0953   LDLDIRECT 121.0 04/29/2017 1122      Wt Readings from Last 3 Encounters:  05/27/17 139 lb (63 kg)  04/29/17 140 lb (63.5 kg)  04/10/17 141 lb (64 kg)      ASSESSMENT AND PLAN:  # Hypertensive heart disease: Blood pressure is well-controlled.  However, she now has low BP and orthostatic symptoms.  We will stop HCTZ and continue losartan 100 mg daily and metoprolol 25 mg daily.  # Severe mitral valve prolapse: Ms. Flight has severe prolapse of the posterior leaflet but only mild MR.  We will continue to monitor.  She does not have any evidence of heart failure on exam.  # Epigastric pain: Ms. Hunke has known hypertriglyceridemia.  She has epigastric pain that is concerning for pancreatitis.  We will check amylase, lipase, and a CMP.  She is s/p cholecystectomy.  # Carotid stenosis: Moderate L ICA stenosis and mild R ICA stenosis.  Start aspirin 81 mg daily and continue atorvastatin.  Repeat lipids at follow up.  # Tobacco abuse:  Ms. Debbrah Alar was congratulated on smoking cessation.  Her mood is depressed on Chantix.  She wants to continue it because it is helping with smoking cessation.  She understands that if her moods worsens or if she starts thinking of harming herself or others, she should stop Chantix.   Current medicines are reviewed at length with the patient today.  The patient does not have concerns regarding medicines.  The following changes have been made:  Stop HCTZ.  Add aspirin 81 mg daily.  Labs/ tests ordered today include:   No orders of the defined types were placed in this encounter.    Disposition:   FU with Tyrik Stetzer C. Oval Linsey, MD, [facility] in 1 month    This note was written with the assistance of speech recognition software.  Please excuse any transcriptional errors.  Signed, Kaydyn Sayas C. Oval Linsey, MD, [facility]  09/17/2017 5:49 PM    [facility]

## 2017-09-18 ENCOUNTER — Ambulatory Visit: Payer: Self-pay | Admitting: Cardiovascular Disease

## 2017-12-04 ENCOUNTER — Telehealth: Payer: Self-pay | Admitting: Physician Assistant

## 2017-12-04 ENCOUNTER — Other Ambulatory Visit: Payer: Self-pay | Admitting: *Deleted

## 2017-12-04 MED ORDER — BUDESONIDE-FORMOTEROL FUMARATE 160-4.5 MCG/ACT IN AERO: 2.0000 | INHALATION_SPRAY | Freq: Two times a day (BID) | RESPIRATORY_TRACT | 1 refills | Status: DC

## 2017-12-04 NOTE — Telephone Encounter (Signed)
Copied from [place] [id number]. Topic: Quick Communication - Rx Refill/Question >> Dec 04, 2017  9:46 AM Boyd Kerbs wrote: Medication:  budesonide-formoterol (SYMBICORT) 160-4.5 MCG/ACT   Pt. Is out and having a hard time with COPD   Has the patient contacted their pharmacy? No.   (Agent: If no, request that the patient contact the pharmacy for the refill.)   Preferred Pharmacy (with phone number or street name):  [facility], [address] [place] [address] Phone: [phone] Fax: [phone]   Agent: Please be advised that RX refills may take up to 3 business days. We ask that you follow-up with your pharmacy.

## 2017-12-04 NOTE — Telephone Encounter (Signed)
Rx   Symbicort  Refilled  Per  protocall

## 2017-12-07 ENCOUNTER — Other Ambulatory Visit: Payer: Self-pay

## 2017-12-07 ENCOUNTER — Ambulatory Visit (INDEPENDENT_AMBULATORY_CARE_PROVIDER_SITE_OTHER): Payer: Medicare Other | Admitting: Physician Assistant

## 2017-12-07 ENCOUNTER — Encounter: Payer: Self-pay | Admitting: Physician Assistant

## 2017-12-07 VITALS — BP 140/98 | HR 71 | Temp 98.3°F | Resp 16 | Ht 61.0 in | Wt 145.0 lb

## 2017-12-07 DIAGNOSIS — J441 Chronic obstructive pulmonary disease with (acute) exacerbation: Secondary | ICD-10-CM

## 2017-12-07 MED ORDER — BENZONATATE 100 MG PO CAPS: 100.0000 mg | ORAL_CAPSULE | Freq: Two times a day (BID) | ORAL | 0 refills | Status: DC | PRN

## 2017-12-07 MED ORDER — DOXYCYCLINE HYCLATE 100 MG PO CAPS: 100.0000 mg | ORAL_CAPSULE | Freq: Two times a day (BID) | ORAL | 0 refills | Status: DC

## 2017-12-07 MED ORDER — BUPROPION HCL ER (SR) 100 MG PO TB12: 100.0000 mg | ORAL_TABLET | Freq: Two times a day (BID) | ORAL | 0 refills | Status: DC

## 2017-12-07 NOTE — Progress Notes (Signed)
Patient presents to clinic today c/o 3-4 weeks of chest congestion and cough with fatigue. Notes SOB and chest tightness. Notes some nasal congestion.  Denies fevers, chest pain. Is only using Symbicort once daily instead of BID as directed. Is still smoking 1 ppd. Would like to restart Wellbutrin. Has not taken anything OTC for her symptoms.  Past Medical History:  Diagnosis Date  . AAA (abdominal aortic aneurysm) ([place])   . Arthritis   . Cholelithiasis 2011   s/p cholescystectomy   . COPD (chronic obstructive pulmonary disease) (HCC)    no history of PFTs, only diagnosed by CXR, not on any inhalers because she has not had a PCP in over 2 years  . Emphysema 2010   dx'd/CXR  . GERD (gastroesophageal reflux disease)   . History of heartburn   . History of renal cell carcinoma   . Hypercholesteremia    previously on pravastatin   . Hypertension    previously on Lisinopril/HCTZ  . Murmur    no  prior work-up  . Pneumonia 1980's   "walking"  . Renal cell carcinoma 01/2010   s/p right radical nephrectomy 01/2010,  [facility]  . Shortness of breath 09/26/11   "lately all the time"    Current Outpatient Medications on File Prior to Visit  Medication Sig Dispense Refill  . albuterol (PROVENTIL HFA;VENTOLIN HFA) 108 (90 Base) MCG/ACT inhaler Inhale 1-2 puffs into the lungs every 6 (six) hours as needed for wheezing or shortness of breath. 3 Inhaler 1  . albuterol (PROVENTIL) (2.5 MG/3ML) 0.083% nebulizer solution Take 3 mLs (2.5 mg total) by nebulization every 6 (six) hours as needed for wheezing or shortness of breath. 150 mL 1  . atorvastatin (LIPITOR) 40 MG tablet Take 1 tablet (40 mg total) by mouth daily at 6 PM. 30 tablet 1  . budesonide-formoterol (SYMBICORT) 160-4.5 MCG/ACT inhaler Inhale 2 puffs into the lungs 2 (two) times daily. 3 Inhaler 1  . fenofibrate 160 MG tablet Take 1 tablet (160 mg total) by mouth daily. 90 tablet 1  . losartan (COZAAR) 100 MG tablet  Take 1 tablet (100 mg total) by mouth daily. 90 tablet 1  . metoprolol tartrate (LOPRESSOR) 25 MG tablet Take 1 tablet (25 mg total) by mouth 2 (two) times daily. 180 tablet 1   No current facility-administered medications on file prior to visit.     Allergies  Allergen Reactions  . Ace Inhibitors     Pseudoasthma/ renal failure    Family History  Problem Relation Age of Onset  . COPD Mother        DECEASED/SMOKED  . Diabetes Brother   . Diabetes Brother     Social History   Socioeconomic History  . Marital status: Married    Spouse name: None  . Number of children: 3  . Years of education: 41  . Highest education level: None  Social Needs  . Financial resource strain: None  . Food insecurity - worry: None  . Food insecurity - inability: None  . Transportation needs - medical: None  . Transportation needs - non-medical: None  Occupational History  . 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 Smoking status: Current Every Day Smoker    Packs/day: 1.50    Years: 53.00    Pack years: 79.50    Types: Cigarettes  . Smokeless tobacco: Never Used  . Tobacco comment: Resourse  card and smoking cessatin classes information provided  Substance and Sexual Activity  . Alcohol use: Yes    Alcohol/week: 0.0 oz    Comment:  "drink very rarely"  . Drug use: No    Comment: hx of cocaine last in 2006  . Sexual activity: No  Other Topics Concern  . None  Social History Narrative   Lives in [place] with her husband.    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    Patient has 3 grown children that do not live with her.    Patient does not have any medical insurance.   Review of Systems - See HPI.  All other ROS are negative.  BP (!) 140/98   Pulse 71   Temp 98.3 F (36.8 C) (Oral)   Resp 16   Ht 5\' 1"  (1.549 m)   Wt 145 lb (65.8 kg)   SpO2 98%   BMI 27.40 kg/m     Physical Exam  Constitutional: She is oriented to person, place, and time and well-developed, well-nourished, and in no distress.  HENT:  Head: Normocephalic and atraumatic.  Right Ear: External ear normal.  Left Ear: External ear normal.  Nose: Nose normal.  Mouth/Throat: Oropharynx is clear and moist. No oropharyngeal exudate.  TM within normal limits bilaterally.  Eyes: Conjunctivae are normal.  Neck: Neck supple.  Cardiovascular: Normal rate, regular rhythm, normal heart sounds and intact distal pulses.  Pulmonary/Chest: Effort normal. No respiratory distress. She has no rales. She exhibits no tenderness.  Neurological: She is alert and oriented to person, place, and time.  Skin: Skin is warm and dry. No rash noted.  Psychiatric: Affect normal.  Vitals reviewed.  Assessment/Plan: 1. COPD exacerbation (HCC) Rx Doxycycline. Restart Symbicort at BID dosing. Plain Mucinex recommended. Rx Tessalon for cough. Close follow-up scheduled. Will restart Wellbutrin for smoking cessation.   - doxycycline (VIBRAMYCIN) 100 MG capsule; Take 1 capsule (100 mg total) by mouth 2 (two) times daily.  Dispense: 14 capsule; Refill: 0 - benzonatate (TESSALON) 100 MG capsule; Take 1 capsule (100 mg total) by mouth 2 (two) times daily as needed for cough.  Dispense: 20 capsule; Refill: 0   [name], PA-C

## 2017-12-07 NOTE — Patient Instructions (Addendum)
Increase fluids and get plenty of rest.  Increase the Symbicort to twice daily dosing.  Albuterol as needed.  Get some plain Mucinex to take twice daily. Start the [facility] for cough and the Doxycycline as directed.   Follow-up 1 weeks. Once better, you can restart your Wellbutrin to help with smoking cessation.

## 2018-01-15 ENCOUNTER — Other Ambulatory Visit: Payer: Self-pay | Admitting: Physician Assistant

## 2018-02-10 ENCOUNTER — Other Ambulatory Visit: Payer: Self-pay | Admitting: Physician Assistant

## 2018-02-10 DIAGNOSIS — E782 Mixed hyperlipidemia: Secondary | ICD-10-CM

## 2018-02-12 ENCOUNTER — Other Ambulatory Visit: Payer: Self-pay | Admitting: Emergency Medicine

## 2018-02-12 DIAGNOSIS — E782 Mixed hyperlipidemia: Secondary | ICD-10-CM

## 2018-02-12 MED ORDER — LOSARTAN POTASSIUM 100 MG PO TABS: 100.0000 mg | ORAL_TABLET | Freq: Every day | ORAL | 0 refills | Status: DC

## 2018-02-12 MED ORDER — ATORVASTATIN CALCIUM 40 MG PO TABS: ORAL_TABLET | ORAL | 0 refills | Status: DC

## 2018-03-24 ENCOUNTER — Telehealth: Payer: Self-pay | Admitting: Emergency Medicine

## 2018-03-24 DIAGNOSIS — J4531 Mild persistent asthma with (acute) exacerbation: Secondary | ICD-10-CM

## 2018-03-24 MED ORDER — ALBUTEROL SULFATE (2.5 MG/3ML) 0.083% IN NEBU: 2.5000 mg | INHALATION_SOLUTION | Freq: Four times a day (QID) | RESPIRATORY_TRACT | 0 refills | Status: DC | PRN

## 2018-03-24 NOTE — Telephone Encounter (Signed)
Per PCP to ok to refill the albuterol for the nebulizer. Spoke with patient if her symptoms worsens while she is in [place] she would need to be seen. She is agreeable. Rx sent to the [facility] in [place]  Copied from [place] [phone]. Topic: General - Other >> Mar 24, 2018 12:29 PM Yvette Rack wrote: Reason for CRM: pt states that she is out of town in [place] she would like a refill on albuterol (PROVENTIL) (2.5 MG/3ML) 0.083% nebulizer solution she states that its hard for her to breath since she has gotten there she will also be going to [facility] Friday morning name of pharmacy  is [facility] on [address] [place] [address]  Phone: [phone]

## 2018-04-05 ENCOUNTER — Other Ambulatory Visit: Payer: Self-pay

## 2018-04-05 ENCOUNTER — Ambulatory Visit (INDEPENDENT_AMBULATORY_CARE_PROVIDER_SITE_OTHER): Payer: Medicare Other | Admitting: Physician Assistant

## 2018-04-05 ENCOUNTER — Encounter: Payer: Self-pay | Admitting: Physician Assistant

## 2018-04-05 VITALS — BP 124/70 | HR 71 | Temp 98.2°F | Resp 16 | Ht 61.0 in | Wt 146.0 lb

## 2018-04-05 DIAGNOSIS — E782 Mixed hyperlipidemia: Secondary | ICD-10-CM | POA: Diagnosis not present

## 2018-04-05 DIAGNOSIS — I10 Essential (primary) hypertension: Secondary | ICD-10-CM

## 2018-04-05 DIAGNOSIS — J449 Chronic obstructive pulmonary disease, unspecified: Secondary | ICD-10-CM

## 2018-04-05 DIAGNOSIS — E559 Vitamin D deficiency, unspecified: Secondary | ICD-10-CM | POA: Diagnosis not present

## 2018-04-05 DIAGNOSIS — F418 Other specified anxiety disorders: Secondary | ICD-10-CM

## 2018-04-05 DIAGNOSIS — R350 Frequency of micturition: Secondary | ICD-10-CM | POA: Diagnosis not present

## 2018-04-05 DIAGNOSIS — Z720 Tobacco use: Secondary | ICD-10-CM | POA: Diagnosis not present

## 2018-04-05 LAB — CBC WITH DIFFERENTIAL/PLATELET
BASOS PCT: 0.8 % (ref 0.0–3.0)
Basophils Absolute: 0 10*3/uL (ref 0.0–0.1)
EOS ABS: 0.2 10*3/uL (ref 0.0–0.7)
Eosinophils Relative: 3.1 % (ref 0.0–5.0)
HCT: 38.8 % (ref 36.0–46.0)
Hemoglobin: 12.9 g/dL (ref 12.0–15.0)
Lymphocytes Relative: 32.2 % (ref 12.0–46.0)
Lymphs Abs: 1.7 10*3/uL (ref 0.7–4.0)
MCHC: 33.4 g/dL (ref 30.0–36.0)
MCV: 89.7 fl (ref 78.0–100.0)
MONO ABS: 0.4 10*3/uL (ref 0.1–1.0)
Monocytes Relative: 7 % (ref 3.0–12.0)
NEUTROS ABS: 3.1 10*3/uL (ref 1.4–7.7)
Neutrophils Relative %: 56.9 % (ref 43.0–77.0)
PLATELETS: 303 10*3/uL (ref 150.0–400.0)
RBC: 4.33 Mil/uL (ref 3.87–5.11)
RDW: 14 % (ref 11.5–15.5)
WBC: 5.4 10*3/uL (ref 4.0–10.5)

## 2018-04-05 LAB — POCT URINALYSIS DIPSTICK
BILIRUBIN UA: NEGATIVE
GLUCOSE UA: NEGATIVE
Ketones, UA: NEGATIVE
LEUKOCYTES UA: NEGATIVE
Nitrite, UA: NEGATIVE
PH UA: 5 (ref 5.0–8.0)
Protein, UA: NEGATIVE
RBC UA: NEGATIVE
UROBILINOGEN UA: 0.2 U/dL

## 2018-04-05 LAB — COMPREHENSIVE METABOLIC PANEL
ALT: 14 U/L (ref 0–35)
AST: 17 U/L (ref 0–37)
Albumin: 4.1 g/dL (ref 3.5–5.2)
Alkaline Phosphatase: 65 U/L (ref 39–117)
BUN: 16 mg/dL (ref 6–23)
CHLORIDE: 107 meq/L (ref 96–112)
CO2: 27 meq/L (ref 19–32)
CREATININE: 1.27 mg/dL — AB (ref 0.40–1.20)
Calcium: 9.6 mg/dL (ref 8.4–10.5)
GFR: 44.18 mL/min — ABNORMAL LOW (ref >60.00)
Glucose, Bld: 96 mg/dL (ref 70–99)
Potassium: 4 mEq/L (ref 3.5–5.1)
SODIUM: 141 meq/L (ref 135–145)
Total Bilirubin: 0.3 mg/dL (ref 0.2–1.2)
Total Protein: 6.9 g/dL (ref 6.0–8.3)

## 2018-04-05 LAB — LIPID PANEL
CHOLESTEROL: 220 mg/dL — AB (ref 0–200)
HDL: 74.9 mg/dL (ref >39.00)
LDL Cholesterol: 110 mg/dL — ABNORMAL HIGH (ref 0–99)
NONHDL: 144.75
Total CHOL/HDL Ratio: 3
Triglycerides: 172 mg/dL — ABNORMAL HIGH (ref 0.0–149.0)
VLDL: 34.4 mg/dL (ref 0.0–40.0)

## 2018-04-05 LAB — HEMOGLOBIN A1C: Hgb A1c MFr Bld: 5.7 % (ref 4.6–6.5)

## 2018-04-05 LAB — VITAMIN D 25 HYDROXY (VIT D DEFICIENCY, FRACTURES): VITD: 31.74 ng/mL (ref 30.00–100.00)

## 2018-04-05 MED ORDER — BUPROPION HCL ER (SR) 100 MG PO TB12
100.0000 mg | ORAL_TABLET | Freq: Two times a day (BID) | ORAL | 1 refills | Status: DC
Start: 2018-04-05 — End: 2018-09-28

## 2018-04-05 MED ORDER — LOSARTAN POTASSIUM 100 MG PO TABS: 100.0000 mg | ORAL_TABLET | Freq: Every day | ORAL | 0 refills | Status: DC

## 2018-04-05 MED ORDER — VARENICLINE TARTRATE 0.5 MG X 11 & 1 MG X 42 PO MISC: ORAL | 0 refills | Status: DC

## 2018-04-05 NOTE — Patient Instructions (Signed)
Please go to the lab today for blood work.  I will call you with your results. We will alter treatment regimen(s) if indicated by your results.   Restart chronic medications as directed. Start Mucinex daily along with increasing your water intake. Use Symbicort as directed.   Please restart the Chantix and take as directed . Follow-up with me in 1 month.

## 2018-04-05 NOTE — Assessment & Plan Note (Signed)
Ready to quit. Restart Chantix starting pack. Follow-up 1 month. Due for repeat CT lung cancer screening next month. Reminder placed to call patient and place order.

## 2018-04-05 NOTE — Assessment & Plan Note (Signed)
Is taking medications as directed. Has poor diet. Dietary and exercise recommendations reviewed with patient. Will check fasting lipid and CMP today.

## 2018-04-05 NOTE — Assessment & Plan Note (Signed)
Deteriorated off of Wellbutrin. Will restart today. Follow-up 1 month.

## 2018-04-05 NOTE — Progress Notes (Signed)
Patient presents to clinic today for follow-up.   Hypertension -- Patient is currently on losartan 100 mg daily. Was taking as directed without side effects. Has been out of medication for a month. Patient denies chest pain, palpitations, lightheadedness, dizziness, vision changes or frequent headaches.  Hyperlipidemia -- Is currently on a regimen of Atorvastatin and Fenofibrate. Is taking as directed and tolerating well. Is not following a well-balanced diet currently.   COPD -- recent flare-up while in [place] requiring rescue inhaler along with chronic Symbicort. Has resolved back to her baseline of chronic cough with clear sputum production. Denies fever, chills, malaise or fatigue. Is taking her medications as directed. No current Mucinex use. Continues to smoke (see below).   Tobacco Use -- patient with > 40 pack-year smoking history, currently smoking about 1/2 ppd. Has made attempt previously to quit with Chantix but did not use medication as directed. States giving her recent mild exacerbation of COPD while on vacation, she is ready to quit now. Would like to give the Chantix a true try.  Depressed Mood -- previously doing well on Wellbutrin. Has not had medication in 1 month. Notes decline in mood. Denies anxiety, SI/HI.  Past Medical History:  Diagnosis Date  . AAA (abdominal aortic aneurysm) ([facility] [place])   . Arthritis   . Cholelithiasis 2011   s/p cholescystectomy   . COPD (chronic obstructive pulmonary disease) ([facility])    no history of PFTs, only diagnosed by CXR, not on any inhalers because she has not had a PCP in over 2 years  . Emphysema 2010   dx'd/CXR  . GERD (gastroesophageal reflux disease)   . History of heartburn   . History of renal cell carcinoma   . Hypercholesteremia    previously on pravastatin   . Hypertension    previously on Lisinopril/HCTZ  . Murmur    no  prior work-up  . Pneumonia 1980's   "walking"  . Renal cell carcinoma 01/2010   s/p right radical  nephrectomy 01/2010,  followed by [facility]  . Shortness of breath 09/26/11   "lately all the time"    Current Outpatient Medications on File Prior to Visit  Medication Sig Dispense Refill  . albuterol (PROVENTIL HFA;VENTOLIN HFA) 108 (90 Base) MCG/ACT inhaler Inhale 1-2 puffs into the lungs every 6 (six) hours as needed for wheezing or shortness of breath. 3 Inhaler 1  . albuterol (PROVENTIL) (2.5 MG/3ML) 0.083% nebulizer solution Take 3 mLs (2.5 mg total) by nebulization every 6 (six) hours as needed for wheezing or shortness of breath. 150 mL 0  . atorvastatin (LIPITOR) 40 MG tablet Take 1 tablet (40 mg total) by mouth daily at 6 PM. 30 tablet 1  . budesonide-formoterol (SYMBICORT) 160-4.5 MCG/ACT inhaler Inhale 2 puffs into the lungs 2 (two) times daily. 3 Inhaler 1  . fenofibrate 160 MG tablet Take 1 tablet (160 mg total) by mouth daily. 90 tablet 1  . metoprolol tartrate (LOPRESSOR) 25 MG tablet TAKE ONE TABLET BY MOUTH TWICE DAILY 180 tablet 1   No current facility-administered medications on file prior to visit.     Allergies  Allergen Reactions  . Ace Inhibitors     Pseudoasthma/ renal failure    Family History  Problem Relation Age of Onset  . COPD Mother        DECEASED/SMOKED  . Diabetes Brother   . Diabetes Brother     Social History   Socioeconomic History  . Marital status: Married    Spouse  name: Not on file  . Number of children: 3  . Years of education: 76  . Highest education level: Not on file  Occupational History  . 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 Financial resource strain: Not on file  . Food insecurity:    Worry: Not on file    Inability: Not on file  . Transportation needs:    Medical: Not on file    Non-medical: Not on file  Tobacco Use  . Smoking status: Current Every Day Smoker    Packs/day: 1.50    Years: 53.00    Pack years: 79.50    Types:  Cigarettes  . Smokeless tobacco: Never Used  Substance and Sexual Activity  . Alcohol use: Yes    Alcohol/week: 0.0 oz    Comment:  "drink very rarely"  . Drug use: No    Types: Cocaine    Comment: hx of cocaine last in 2006  . Sexual activity: Never  Lifestyle  . Physical activity:    Days per week: Not on file    Minutes per session: Not on file  . Stress: Not on file  Relationships  . Social connections:    Talks on phone: Not on file    Gets together: Not on file    Attends religious service: Not on file    Active member of club or organization: Not on file    Attends meetings of clubs or organizations: Not on file    Relationship status: Not on file  Other Topics Concern  . Not on file  Social History Narrative   Lives in [place] with her husband.    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    Patient has 3 grown children that do not live with her.    Patient does not have any medical insurance.   Review of Systems - See HPI.  All other ROS are negative.  BP 124/70   Pulse 71   Temp 98.2 F (36.8 C) (Oral)   Resp 16   Ht 5\' 1"  (1.549 m)   Wt 146 lb (66.2 kg)   SpO2 98%   BMI 27.59 kg/m   Physical Exam  Constitutional: She is oriented to person, place, and time. She appears well-developed and well-nourished.  HENT:  Head: Normocephalic and atraumatic.  Eyes: Conjunctivae are normal.  Neck: Neck supple.  Cardiovascular: Normal rate, regular rhythm, normal heart sounds and intact distal pulses.  Pulmonary/Chest: Effort normal. No stridor. No respiratory distress. She has wheezes. She has no rales. She exhibits no tenderness.  Neurological: She is alert and oriented to person, place, and time. No cranial nerve deficit.  Vitals reviewed.  Assessment/Plan: Essential hypertension BP normotensive in office. Higher than baseline. Notes some elevated BP at home. Asymptomatic. Restart losartan. BMP today. DASH diet reviewed with  patient.  COPD GOLD O  Needs to follow-up with Pulmonology. She is to continue current regimen, restart the Mucinex daily with good hydration. We are starting Chantix for smoking cessation.   Depression with anxiety Deteriorated off of Wellbutrin. Will restart today. Follow-up 1 month.  HLD (hyperlipidemia) Is taking medications as directed. Has poor diet. Dietary and exercise recommendations reviewed with patient. Will check fasting lipid and CMP today.  Tobacco abuse disorder Ready to quit. Restart Chantix starting pack. Follow-up 1 month. Due for repeat CT lung cancer screening next month. Reminder placed  to call patient and place order.     [name], PA-C

## 2018-04-05 NOTE — Assessment & Plan Note (Signed)
Needs to follow-up with Pulmonology. She is to continue current regimen, restart the Mucinex daily with good hydration. We are starting Chantix for smoking cessation.

## 2018-04-05 NOTE — Assessment & Plan Note (Signed)
BP normotensive in office. Higher than baseline. Notes some elevated BP at home. Asymptomatic. Restart losartan. BMP today. DASH diet reviewed with patient.

## 2018-04-07 ENCOUNTER — Other Ambulatory Visit: Payer: Self-pay | Admitting: Physician Assistant

## 2018-04-07 MED ORDER — ALENDRONATE SODIUM 70 MG PO TABS: 70.0000 mg | ORAL_TABLET | ORAL | 2 refills | Status: DC

## 2018-04-08 ENCOUNTER — Other Ambulatory Visit: Payer: Self-pay

## 2018-04-08 DIAGNOSIS — Z0000 Encounter for general adult medical examination without abnormal findings: Secondary | ICD-10-CM

## 2018-04-23 ENCOUNTER — Other Ambulatory Visit: Payer: Self-pay

## 2018-05-05 ENCOUNTER — Telehealth: Payer: Self-pay | Admitting: Physician Assistant

## 2018-05-05 DIAGNOSIS — Z122 Encounter for screening for malignant neoplasm of respiratory organs: Secondary | ICD-10-CM

## 2018-05-05 NOTE — Telephone Encounter (Signed)
-----   Message from Brunetta Jeans, PA-C sent at 04/05/2018 11:02 AM EDT ----- Lung CT screen due

## 2018-05-05 NOTE — Telephone Encounter (Signed)
Patient due for yearly lung cancer screen. Order placed. She will be contacted to schedule.

## 2018-05-10 ENCOUNTER — Other Ambulatory Visit: Payer: Self-pay | Admitting: Physician Assistant

## 2018-05-10 DIAGNOSIS — Z87891 Personal history of nicotine dependence: Secondary | ICD-10-CM

## 2018-05-24 ENCOUNTER — Ambulatory Visit (INDEPENDENT_AMBULATORY_CARE_PROVIDER_SITE_OTHER)
Admission: RE | Admit: 2018-05-24 | Discharge: 2018-05-24 | Disposition: A | Discharge status: Home / Self Care | Payer: Medicare Other | Source: Ambulatory Visit | Attending: Physician Assistant | Admitting: Physician Assistant

## 2018-05-24 DIAGNOSIS — Z87891 Personal history of nicotine dependence: Secondary | ICD-10-CM | POA: Diagnosis not present

## 2018-05-31 ENCOUNTER — Other Ambulatory Visit: Payer: Self-pay

## 2018-05-31 NOTE — Patient Outreach (Signed)
[facility]) Care Management  05/31/2018  [name] [birth year] [id number]   Medication Adherence call to [name] left a message for patient to call back patient is due on Atorvastatin 40 mg and Losartan 100 mg. Mrs. Rodiguez is showing past due under [place].   [facility] Direct Dial [phone]  Fax [phone] [email or web address]

## 2018-06-10 ENCOUNTER — Ambulatory Visit: Payer: Self-pay | Admitting: Physician Assistant

## 2018-06-10 NOTE — Telephone Encounter (Signed)
Called c/o her BP being elevated.   This morning her BP is 167/100.   She did take her medications at 8:00am this morning.   Her BP was about the same yesterday morning even after taking her BP medications.   She can't remember what the actual reading was.  She is also c/o a slight headache today.  When I went to schedule her an appt she mentioned she has to arrange for someone to drive her in.    She requested to call back after she talks with someone who can drive her in.    "I can't get in today".  I instructed her to please call us back as soon as she can so we can get her scheduled for her elevated BP.   She agreed to this plan and assured me she would call us back for an appt as soon as she got someone who can drive her in.   I let her know she did not need to talk with a nurse again when she calls in for an appt.   Let the agent know she has spoken with a nurse already.      I routed a note to [name] her provider at [place] office so they would be aware.  Reason for Disposition . [3] Systolic BP  >= 202 OR Diastolic >= 80 AND [3] taking BP medications  Answer Assessment - Initial Assessment Questions 1. BLOOD PRESSURE: "What is the blood pressure?" "Did you take at least two measurements 5 minutes apart?"     167/100 this am.     I took my medications at 8:00am.   Yesterday it was high too after taking my medications. 2. ONSET: "When did you take your blood pressure?"     This morning 3. HOW: "How did you obtain the blood pressure?" (e.g., visiting nurse, automatic home BP monitor)     Automatic BP cuff on upper arm. 4. HISTORY: "Do you have a history of high blood pressure?"     Yes.  Takes medications 5. MEDICATIONS: "Are you taking any medications for blood pressure?" "Have you missed any doses recently?"     I missed taking my medications for 2 days in a row.    I think it was Tuesday I missed. 6. OTHER SYMPTOMS: "Do you have any symptoms?" (e.g., headache, chest pain,  blurred vision, difficulty breathing, weakness)     I have a slight headache.   I wasn't dizzy but feel foggy when I get up from a chair.  I took a nap yesterday.   My BP was still high after my nap. 7. PREGNANCY: "Is there any chance you are pregnant?" "When was your last menstrual period?"     Not asked.  Protocols used: HIGH BLOOD PRESSURE-A-AH

## 2018-06-10 NOTE — Telephone Encounter (Signed)
FYI

## 2018-06-11 ENCOUNTER — Ambulatory Visit (INDEPENDENT_AMBULATORY_CARE_PROVIDER_SITE_OTHER): Payer: Medicare Other | Admitting: Physician Assistant

## 2018-06-11 ENCOUNTER — Other Ambulatory Visit: Payer: Self-pay

## 2018-06-11 ENCOUNTER — Encounter: Payer: Self-pay | Admitting: Physician Assistant

## 2018-06-11 VITALS — BP 130/80 | HR 67 | Temp 98.3°F | Resp 16 | Ht 61.0 in | Wt 143.0 lb

## 2018-06-11 DIAGNOSIS — E782 Mixed hyperlipidemia: Secondary | ICD-10-CM

## 2018-06-11 DIAGNOSIS — R3915 Urgency of urination: Secondary | ICD-10-CM | POA: Insufficient documentation

## 2018-06-11 DIAGNOSIS — I10 Essential (primary) hypertension: Secondary | ICD-10-CM | POA: Diagnosis not present

## 2018-06-11 LAB — POCT URINALYSIS DIPSTICK
BILIRUBIN UA: NEGATIVE
Blood, UA: NEGATIVE
GLUCOSE UA: NEGATIVE
Ketones, UA: NEGATIVE
Nitrite, UA: NEGATIVE
PH UA: 5 (ref 5.0–8.0)
Protein, UA: NEGATIVE
Spec Grav, UA: 1.015 (ref 1.010–1.025)
UROBILINOGEN UA: 0.2 U/dL

## 2018-06-11 LAB — BASIC METABOLIC PANEL
BUN: 20 mg/dL (ref 6–23)
CHLORIDE: 104 meq/L (ref 96–112)
CO2: 27 meq/L (ref 19–32)
Calcium: 9.9 mg/dL (ref 8.4–10.5)
Creatinine, Ser: 1.36 mg/dL — ABNORMAL HIGH (ref 0.40–1.20)
GFR: 40.81 mL/min — ABNORMAL LOW (ref >60.00)
Glucose, Bld: 88 mg/dL (ref 70–99)
POTASSIUM: 4.4 meq/L (ref 3.5–5.1)
Sodium: 139 mEq/L (ref 135–145)

## 2018-06-11 MED ORDER — LOSARTAN POTASSIUM 100 MG PO TABS: 100.0000 mg | ORAL_TABLET | Freq: Every day | ORAL | 1 refills | Status: DC

## 2018-06-11 MED ORDER — FENOFIBRATE 160 MG PO TABS: 160.0000 mg | ORAL_TABLET | Freq: Every day | ORAL | 1 refills | Status: DC

## 2018-06-11 MED ORDER — ATORVASTATIN CALCIUM 40 MG PO TABS: 40.0000 mg | ORAL_TABLET | Freq: Every day | ORAL | 1 refills | Status: DC

## 2018-06-11 NOTE — Progress Notes (Signed)
Patient presents to clinic today c/o elevated BP readings earlier in the week. Is currently on a regimen of losartan and metoprolol daily. Notes she had been off of medications for a couple of days and began noting headache and fatigue. BP elevated in 191Y systolic. Denies chest pain or SOB above baseline for her (COPD). Restarted medications yesterday with some improvement in BP. Wanted assessment today. Denies alcohol consumption or NSAID use.   Past Medical History:  Diagnosis Date  . AAA (abdominal aortic aneurysm) ([place])   . Arthritis   . Cholelithiasis 2011   s/p cholescystectomy   . COPD (chronic obstructive pulmonary disease) ([facility])    no history of PFTs, only diagnosed by CXR, not on any inhalers because she has not had a PCP in over 2 years  . Emphysema 2010   dx'd/CXR  . GERD (gastroesophageal reflux disease)   . History of heartburn   . History of renal cell carcinoma   . Hypercholesteremia    previously on pravastatin   . Hypertension    previously on Lisinopril/HCTZ  . Murmur    no  prior work-up  . Pneumonia 1980's   "walking"  . Renal cell carcinoma 01/2010   s/p right radical nephrectomy 01/2010,  [facility]  . Shortness of breath 09/26/11   "lately all the time"    Current Outpatient Medications on File Prior to Visit  Medication Sig Dispense Refill  . albuterol (PROVENTIL HFA;VENTOLIN HFA) 108 (90 Base) MCG/ACT inhaler Inhale 1-2 puffs into the lungs every 6 (six) hours as needed for wheezing or shortness of breath. 3 Inhaler 1  . albuterol (PROVENTIL) (2.5 MG/3ML) 0.083% nebulizer solution Take 3 mLs (2.5 mg total) by nebulization every 6 (six) hours as needed for wheezing or shortness of breath. 150 mL 0  . alendronate (FOSAMAX) 70 MG tablet Take 1 tablet (70 mg total) by mouth every 7 (seven) days. Take with a full glass of water on an empty stomach. 4 tablet 2  . budesonide-formoterol (SYMBICORT) 160-4.5 MCG/ACT inhaler Inhale 2 puffs into  the lungs 2 (two) times daily. 3 Inhaler 1  . buPROPion (WELLBUTRIN SR) 100 MG 12 hr tablet Take 1 tablet (100 mg total) by mouth 2 (two) times daily. 60 tablet 1  . losartan (COZAAR) 100 MG tablet Take 1 tablet (100 mg total) by mouth daily. 90 tablet 0  . metoprolol tartrate (LOPRESSOR) 25 MG tablet TAKE ONE TABLET BY MOUTH TWICE DAILY 180 tablet 1  . fenofibrate 160 MG tablet Take 1 tablet (160 mg total) by mouth daily. (Patient not taking: Reported on 06/11/2018) 90 tablet 1   No current facility-administered medications on file prior to visit.     Allergies  Allergen Reactions  . Ace Inhibitors     Pseudoasthma/ renal failure    Family History  Problem Relation Age of Onset  . COPD Mother        DECEASED/SMOKED  . Diabetes Brother   . Diabetes Brother     Social History   Socioeconomic History  . Marital status: Married    Spouse name: Not on file  . Number of children: 3  . Years of education: 88  . Highest education level: Not on file  Occupational History  . 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 Financial resource strain: Not on file  . Food insecurity:    Worry: Not on file  Inability: Not on file  . Transportation needs:    Medical: Not on file    Non-medical: Not on file  Tobacco Use  . Smoking status: Current Every Day Smoker    Packs/day: 1.50    Years: 53.00    Pack years: 79.50    Types: Cigarettes  . Smokeless tobacco: Never Used  Substance and Sexual Activity  . Alcohol use: Yes    Alcohol/week: 0.0 standard drinks    Comment:  "drink very rarely"  . Drug use: No    Types: Cocaine    Comment: hx of cocaine last in 2006  . Sexual activity: Never  Lifestyle  . Physical activity:    Days per week: Not on file    Minutes per session: Not on file  . Stress: Not on file  Relationships  . Social connections:    Talks on phone: Not on file    Gets together: Not on  file    Attends religious service: Not on file    Active member of club or organization: Not on file    Attends meetings of clubs or organizations: Not on file    Relationship status: Not on file  Other Topics Concern  . Not on file  Social History Narrative   Lives in [place] with her husband.    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    Patient has 3 grown children that do not live with her.    Patient does not have any medical insurance.   Review of Systems - See HPI.  All other ROS are negative.  BP 130/80   Pulse 67   Temp 98.3 F (36.8 C) (Oral)   Resp 16   Ht 5\' 1"  (1.549 m)   Wt 143 lb (64.9 kg)   SpO2 98%   BMI 27.02 kg/m   Physical Exam  Constitutional: She appears well-developed and well-nourished.  HENT:  Head: Normocephalic and atraumatic.  Eyes: Conjunctivae are normal.  Neck: Neck supple.  Cardiovascular: Normal rate, regular rhythm, normal heart sounds and intact distal pulses.  Pulmonary/Chest: Effort normal and breath sounds normal. No stridor. No respiratory distress. She has no wheezes. She has no rales. She exhibits no tenderness.  Abdominal: Soft. Bowel sounds are normal. She exhibits no distension. There is no tenderness.  Lymphadenopathy:    She has no cervical adenopathy.  Psychiatric: She has a normal mood and affect.  Vitals reviewed.  Recent Results (from the past 2160 hour(s))  POCT Urinalysis Dipstick     Status: Abnormal   Collection Time: 04/05/18  9:29 AM  Result Value Ref Range   Color, UA yellow    Clarity, UA clear    Glucose, UA Negative Negative   Bilirubin, UA negative    Ketones, UA negative    Spec Grav, UA <=1.005 (A) 1.010 - 1.025   Blood, UA negative    pH, UA 5.0 5.0 - 8.0   Protein, UA Negative Negative   Urobilinogen, UA 0.2 0.2 or 1.0 E.U./dL   Nitrite, UA negative    Leukocytes, UA Negative Negative   Appearance     Odor    CBC with Differential/Platelet     Status: None     Collection Time: 04/05/18  9:55 AM  Result Value Ref Range   WBC 5.4 4.0 - 10.5 K/uL   RBC 4.33 3.87 - 5.11 Mil/uL   Hemoglobin 12.9 12.0 - 15.0 g/dL   HCT 38.8 36.0 -  46.0 %   MCV 89.7 78.0 - 100.0 fl   MCHC 33.4 30.0 - 36.0 g/dL   RDW 14.0 11.5 - 15.5 %   Platelets 303.0 150.0 - 400.0 K/uL   Neutrophils Relative % 56.9 43.0 - 77.0 %   Lymphocytes Relative 32.2 12.0 - 46.0 %   Monocytes Relative 7.0 3.0 - 12.0 %   Eosinophils Relative 3.1 0.0 - 5.0 %   Basophils Relative 0.8 0.0 - 3.0 %   Neutro Abs 3.1 1.4 - 7.7 K/uL   Lymphs Abs 1.7 0.7 - 4.0 K/uL   Monocytes Absolute 0.4 0.1 - 1.0 K/uL   Eosinophils Absolute 0.2 0.0 - 0.7 K/uL   Basophils Absolute 0.0 0.0 - 0.1 K/uL  Comprehensive metabolic panel     Status: Abnormal   Collection Time: 04/05/18  9:55 AM  Result Value Ref Range   Sodium 141 135 - 145 mEq/L   Potassium 4.0 3.5 - 5.1 mEq/L   Chloride 107 96 - 112 mEq/L   CO2 27 19 - 32 mEq/L   Glucose, Bld 96 70 - 99 mg/dL   BUN 16 6 - 23 mg/dL   Creatinine, Ser 1.27 (H) 0.40 - 1.20 mg/dL   Total Bilirubin 0.3 0.2 - 1.2 mg/dL   Alkaline Phosphatase 65 39 - 117 U/L   AST 17 0 - 37 U/L   ALT 14 0 - 35 U/L   Total Protein 6.9 6.0 - 8.3 g/dL   Albumin 4.1 3.5 - 5.2 g/dL   Calcium 9.6 8.4 - 10.5 mg/dL   GFR 44.18 (L) >60.00 mL/min  Lipid panel     Status: Abnormal   Collection Time: 04/05/18  9:55 AM  Result Value Ref Range   Cholesterol 220 (H) 0 - 200 mg/dL    Comment: ATP III Classification       Desirable:  < 200 mg/dL               Borderline High:  200 - 239 mg/dL          High:  > = 240 mg/dL   Triglycerides 172.0 (H) 0.0 - 149.0 mg/dL    Comment: Normal:  <150 mg/dLBorderline High:  150 - 199 mg/dL   HDL 74.90 >39.00 mg/dL   VLDL 34.4 0.0 - 40.0 mg/dL   LDL Cholesterol 110 (H) 0 - 99 mg/dL   Total CHOL/HDL Ratio 3     Comment:                Men          Women1/2 Average Risk     3.4          3.3Average Risk          5.0          4.42X Average Risk          9.6           7.13X Average Risk          15.0          11.0                       NonHDL 144.75     Comment: NOTE:  Non-HDL goal should be 30 mg/dL higher than patient's LDL goal (i.e. LDL goal of < 70 mg/dL, would have non-HDL goal of < 100 mg/dL)  Hemoglobin A1c     Status: None   Collection Time: 04/05/18  9:55 AM  Result Value Ref Range   Hgb A1c MFr Bld 5.7 4.6 - 6.5 %    Comment: Glycemic Control Guidelines for People with Diabetes:Non Diabetic:  <6%Goal of Therapy: <7%Additional Action Suggested:  >8%   Vitamin D (25 hydroxy)     Status: None   Collection Time: 04/05/18  9:55 AM  Result Value Ref Range   VITD 31.74 30.00 - 100.00 ng/mL  POCT urinalysis dipstick     Status: Abnormal   Collection Time: 06/11/18  9:11 AM  Result Value Ref Range   Color, UA yellow    Clarity, UA clear    Glucose, UA Negative Negative   Bilirubin, UA negative    Ketones, UA negative    Spec Grav, UA 1.015 1.010 - 1.025   Blood, UA negative    pH, UA 5.0 5.0 - 8.0   Protein, UA Negative Negative   Urobilinogen, UA 0.2 0.2 or 1.0 E.U./dL   Nitrite, UA negative    Leukocytes, UA Trace (A) Negative   Appearance     Odor      Assessment/Plan: 1. Mixed hyperlipidemia Refill sent today.  - atorvastatin (LIPITOR) 40 MG tablet; Take 1 tablet (40 mg total) by mouth daily at 6 PM.  Dispense: 90 tablet; Refill: 1  2. Essential hypertension BP stable today. Elevations occurred after not taking her medications for a few day. - Basic metabolic panel  3. Urinary urgency Urine dip trace LE. Will send for urine culture. Patient to monitor symptoms and call if any new symptoms develop prior to urine culture results. - POCT urinalysis dipstick - Urine Culture - Basic metabolic panel   [name], PA-C

## 2018-06-11 NOTE — Patient Instructions (Addendum)
Urine looks good overall.  I am sending for a culture just to make sure the is not a mild infection brewing.  Again keep hydrated and limit salt intake.  Make sure to take BP medications every day. Continue BP medications as directed, keeping a close eye on BP. Check in the morning 1 hour after medications and in the evening.   Please go to the lab today for blood work.  I will call you with your results. We will alter treatment regimen(s) if indicated by your results.   Follow-up with me in 1 week for reassessment.    DASH Eating Plan DASH stands for "Dietary Approaches to Stop Hypertension." The DASH eating plan is a healthy eating plan that has been shown to reduce high blood pressure (hypertension). It may also reduce your risk for type 2 diabetes, heart disease, and stroke. The DASH eating plan may also help with weight loss. What are tips for following this plan? General guidelines  Avoid eating more than 2,300 mg (milligrams) of salt (sodium) a day. If you have hypertension, you may need to reduce your sodium intake to 1,500 mg a day.  Limit alcohol intake to no more than 1 drink a day for nonpregnant women and 2 drinks a day for men. One drink equals 12 oz of beer, 5 oz of wine, or 1 oz of hard liquor.  Work with your health care provider to maintain a healthy body weight or to lose weight. Ask what an ideal weight is for you.  Get at least 30 minutes of exercise that causes your heart to beat faster (aerobic exercise) most days of the week. Activities may include walking, swimming, or biking.  Work with your health care provider or diet and nutrition specialist (dietitian) to adjust your eating plan to your individual calorie needs. Reading food labels  Check food labels for the amount of sodium per serving. Choose foods with less than 5 percent of the Daily Value of sodium. Generally, foods with less than 300 mg of sodium per serving fit into this eating plan.  To find  whole grains, look for the word "whole" as the first word in the ingredient list. Shopping  Buy products labeled as "low-sodium" or "no salt added."  Buy fresh foods. Avoid canned foods and premade or frozen meals. Cooking  Avoid adding salt when cooking. Use salt-free seasonings or herbs instead of table salt or sea salt. Check with your health care provider or pharmacist before using salt substitutes.  Do not fry foods. Cook foods using healthy methods such as baking, boiling, grilling, and broiling instead.  Cook with heart-healthy oils, such as olive, canola, soybean, or sunflower oil. Meal planning   Eat a balanced diet that includes: ? 5 or more servings of fruits and vegetables each day. At each meal, try to fill half of your plate with fruits and vegetables. ? Up to 6-8 servings of whole grains each day. ? Less than 6 oz of lean meat, poultry, or fish each day. A 3-oz serving of meat is about the same size as a deck of cards. One egg equals 1 oz. ? 2 servings of low-fat dairy each day. ? A serving of nuts, seeds, or beans 5 times each week. ? Heart-healthy fats. Healthy fats called Omega-3 fatty acids are found in foods such as flaxseeds and coldwater fish, like sardines, salmon, and mackerel.  Limit how much you eat of the following: ? Canned or prepackaged foods. ? Food that is  high in trans fat, such as fried foods. ? Food that is high in saturated fat, such as fatty meat. ? Sweets, desserts, sugary drinks, and other foods with added sugar. ? Full-fat dairy products.  Do not salt foods before eating.  Try to eat at least 2 vegetarian meals each week.  Eat more home-cooked food and less restaurant, buffet, and fast food.  When eating at a restaurant, ask that your food be prepared with less salt or no salt, if possible. What foods are recommended? The items listed may not be a complete list. Talk with your dietitian about what dietary choices are best for  you. Grains Whole-grain or whole-wheat bread. Whole-grain or whole-wheat pasta. Brown rice. [place]. Bulgur. Whole-grain and low-sodium cereals. Pita bread. Low-fat, low-sodium crackers. Whole-wheat flour tortillas. Vegetables Fresh or frozen vegetables (raw, steamed, roasted, or grilled). Low-sodium or reduced-sodium tomato and vegetable juice. Low-sodium or reduced-sodium tomato sauce and tomato paste. Low-sodium or reduced-sodium canned vegetables. Fruits All fresh, dried, or frozen fruit. Canned fruit in natural juice (without added sugar). Meat and other protein foods Skinless chicken or [place]. Ground chicken or [place]. Pork with fat trimmed off. Fish and seafood. Egg whites. Dried beans, peas, or lentils. Unsalted nuts, nut butters, and seeds. Unsalted canned beans. Lean cuts of beef with fat trimmed off. Low-sodium, lean deli meat. Dairy Low-fat (1%) or fat-free (skim) milk. Fat-free, low-fat, or reduced-fat cheeses. Nonfat, low-sodium ricotta or cottage cheese. Low-fat or nonfat yogurt. Low-fat, low-sodium cheese. Fats and oils Soft margarine without trans fats. Vegetable oil. Low-fat, reduced-fat, or light mayonnaise and salad dressings (reduced-sodium). Canola, safflower, olive, soybean, and sunflower oils. Avocado. Seasoning and other foods Herbs. Spices. Seasoning mixes without salt. Unsalted popcorn and pretzels. Fat-free sweets. What foods are not recommended? The items listed may not be a complete list. Talk with your dietitian about what dietary choices are best for you. Grains Baked goods made with fat, such as croissants, muffins, or some breads. Dry pasta or rice meal packs. Vegetables Creamed or fried vegetables. Vegetables in a cheese sauce. Regular canned vegetables (not low-sodium or reduced-sodium). Regular canned tomato sauce and paste (not low-sodium or reduced-sodium). Regular tomato and vegetable juice (not low-sodium or reduced-sodium). Angie Fava.  Olives. Fruits Canned fruit in a light or heavy syrup. Fried fruit. Fruit in cream or butter sauce. Meat and other protein foods Fatty cuts of meat. Ribs. Fried meat. Berniece Salines. Sausage. Bologna and other processed lunch meats. Salami. Fatback. Hotdogs. Bratwurst. Salted nuts and seeds. Canned beans with added salt. Canned or smoked fish. Whole eggs or egg yolks. Chicken or [place] with skin. Dairy Whole or 2% milk, cream, and half-and-half. Whole or full-fat cream cheese. Whole-fat or sweetened yogurt. Full-fat cheese. Nondairy creamers. Whipped toppings. Processed cheese and cheese spreads. Fats and oils Butter. Stick margarine. Lard. Shortening. Ghee. Bacon fat. Tropical oils, such as coconut, palm kernel, or palm oil. Seasoning and other foods Salted popcorn and pretzels. Onion salt, garlic salt, seasoned salt, table salt, and sea salt. Worcestershire sauce. Tartar sauce. Barbecue sauce. Teriyaki sauce. Soy sauce, including reduced-sodium. Steak sauce. Canned and packaged gravies. Fish sauce. Oyster sauce. Cocktail sauce. Horseradish that you find on the shelf. Ketchup. Mustard. Meat flavorings and tenderizers. Bouillon cubes. Hot sauce and Tabasco sauce. Premade or packaged marinades. Premade or packaged taco seasonings. Relishes. Regular salad dressings. Where to find more information:  National Heart, Lung, and [place]: https://wilson-eaton.com/  American Heart Association: www.heart.org Summary  The DASH eating plan is a healthy eating  plan that has been shown to reduce high blood pressure (hypertension). It may also reduce your risk for type 2 diabetes, heart disease, and stroke.  With the DASH eating plan, you should limit salt (sodium) intake to 2,300 mg a day. If you have hypertension, you may need to reduce your sodium intake to 1,500 mg a day.  When on the DASH eating plan, aim to eat more fresh fruits and vegetables, whole grains, lean proteins, low-fat dairy, and heart-healthy  fats.  Work with your health care provider or diet and nutrition specialist (dietitian) to adjust your eating plan to your individual calorie needs. This information is not intended to replace advice given to you by your health care provider. Make sure you discuss any questions you have with your health care provider. Document Released: 09/25/2011 Document Revised: 09/29/2016 Document Reviewed: 09/29/2016 Elsevier Interactive Patient Education  [employer].

## 2018-06-13 LAB — URINE CULTURE
MICRO NUMBER:: 91011588
SPECIMEN QUALITY: ADEQUATE

## 2018-06-18 ENCOUNTER — Other Ambulatory Visit: Payer: Self-pay | Admitting: Acute Care

## 2018-06-18 DIAGNOSIS — Z122 Encounter for screening for malignant neoplasm of respiratory organs: Secondary | ICD-10-CM

## 2018-06-18 DIAGNOSIS — F17210 Nicotine dependence, cigarettes, uncomplicated: Principal | ICD-10-CM

## 2018-06-18 NOTE — Progress Notes (Signed)
ch

## 2018-06-24 ENCOUNTER — Other Ambulatory Visit: Payer: Self-pay

## 2018-06-24 NOTE — Patient Outreach (Signed)
[facility]) Care Management  06/24/2018  Katie [name] 05-Jun-1946 [id number]   Medication Adherence call to Mrs. Katie [name] left a message for patient to call back patient is due on Losartan 100 mg and Atorvastatin 40 mg. Katie [name] is showing past due under [facility].  [facility] Management Direct Dial [phone]  Fax [phone] [name]

## 2018-09-28 ENCOUNTER — Observation Stay (HOSPITAL_COMMUNITY)
Admission: EM | Admit: 2018-09-28 | Discharge: 2018-09-29 | Disposition: A | Discharge status: Home / Self Care | Payer: Medicare Other | Attending: Internal Medicine | Admitting: Internal Medicine

## 2018-09-28 ENCOUNTER — Emergency Department (HOSPITAL_COMMUNITY): Payer: Medicare Other

## 2018-09-28 ENCOUNTER — Other Ambulatory Visit: Payer: Self-pay

## 2018-09-28 ENCOUNTER — Encounter (HOSPITAL_COMMUNITY): Payer: Self-pay | Admitting: Emergency Medicine

## 2018-09-28 ENCOUNTER — Ambulatory Visit: Payer: Self-pay

## 2018-09-28 DIAGNOSIS — I160 Hypertensive urgency: Secondary | ICD-10-CM | POA: Diagnosis present

## 2018-09-28 DIAGNOSIS — Z720 Tobacco use: Secondary | ICD-10-CM | POA: Diagnosis present

## 2018-09-28 DIAGNOSIS — F17210 Nicotine dependence, cigarettes, uncomplicated: Secondary | ICD-10-CM | POA: Diagnosis not present

## 2018-09-28 DIAGNOSIS — N183 Chronic kidney disease, stage 3 unspecified: Secondary | ICD-10-CM | POA: Diagnosis present

## 2018-09-28 DIAGNOSIS — I129 Hypertensive chronic kidney disease with stage 1 through stage 4 chronic kidney disease, or unspecified chronic kidney disease: Principal | ICD-10-CM | POA: Insufficient documentation

## 2018-09-28 DIAGNOSIS — E785 Hyperlipidemia, unspecified: Secondary | ICD-10-CM | POA: Diagnosis not present

## 2018-09-28 DIAGNOSIS — Z905 Acquired absence of kidney: Secondary | ICD-10-CM | POA: Diagnosis not present

## 2018-09-28 DIAGNOSIS — Z7951 Long term (current) use of inhaled steroids: Secondary | ICD-10-CM | POA: Insufficient documentation

## 2018-09-28 DIAGNOSIS — K219 Gastro-esophageal reflux disease without esophagitis: Secondary | ICD-10-CM | POA: Insufficient documentation

## 2018-09-28 DIAGNOSIS — J449 Chronic obstructive pulmonary disease, unspecified: Secondary | ICD-10-CM | POA: Diagnosis present

## 2018-09-28 DIAGNOSIS — I10 Essential (primary) hypertension: Secondary | ICD-10-CM | POA: Diagnosis not present

## 2018-09-28 DIAGNOSIS — E7800 Pure hypercholesterolemia, unspecified: Secondary | ICD-10-CM | POA: Insufficient documentation

## 2018-09-28 DIAGNOSIS — R0789 Other chest pain: Secondary | ICD-10-CM | POA: Diagnosis present

## 2018-09-28 DIAGNOSIS — I714 Abdominal aortic aneurysm, without rupture: Secondary | ICD-10-CM | POA: Diagnosis not present

## 2018-09-28 DIAGNOSIS — R079 Chest pain, unspecified: Secondary | ICD-10-CM | POA: Diagnosis not present

## 2018-09-28 DIAGNOSIS — R51 Headache: Secondary | ICD-10-CM | POA: Insufficient documentation

## 2018-09-28 DIAGNOSIS — N1831 Chronic kidney disease, stage 3a: Secondary | ICD-10-CM | POA: Diagnosis present

## 2018-09-28 DIAGNOSIS — I340 Nonrheumatic mitral (valve) insufficiency: Secondary | ICD-10-CM | POA: Insufficient documentation

## 2018-09-28 DIAGNOSIS — Z79899 Other long term (current) drug therapy: Secondary | ICD-10-CM | POA: Diagnosis not present

## 2018-09-28 DIAGNOSIS — H538 Other visual disturbances: Secondary | ICD-10-CM | POA: Diagnosis not present

## 2018-09-28 LAB — I-STAT TROPONIN, ED: Troponin i, poc: 0 ng/mL (ref 0.00–0.08)

## 2018-09-28 LAB — CBC
HCT: 42.1 % (ref 36.0–46.0)
Hemoglobin: 13.2 g/dL (ref 12.0–15.0)
MCH: 28.6 pg (ref 26.0–34.0)
MCHC: 31.4 g/dL (ref 30.0–36.0)
MCV: 91.1 fL (ref 80.0–100.0)
Platelets: 267 10*3/uL (ref 150–400)
RBC: 4.62 MIL/uL (ref 3.87–5.11)
RDW: 12.4 % (ref 11.5–15.5)
WBC: 5.6 10*3/uL (ref 4.0–10.5)
nRBC: 0 % (ref 0.0–0.2)

## 2018-09-28 LAB — POCT I-STAT TROPONIN I: Troponin i, poc: 0 ng/mL (ref 0.00–0.08)

## 2018-09-28 LAB — BASIC METABOLIC PANEL
ANION GAP: 11 (ref 5–15)
BUN: 20 mg/dL (ref 8–23)
CO2: 24 mmol/L (ref 22–32)
Calcium: 9.3 mg/dL (ref 8.9–10.3)
Chloride: 107 mmol/L (ref 98–111)
Creatinine, Ser: 1.26 mg/dL — ABNORMAL HIGH (ref 0.44–1.00)
GFR calc Af Amer: 50 mL/min — ABNORMAL LOW (ref >60)
GFR, EST NON AFRICAN AMERICAN: 43 mL/min — AB (ref >60)
Glucose, Bld: 114 mg/dL — ABNORMAL HIGH (ref 70–99)
Potassium: 4.3 mmol/L (ref 3.5–5.1)
Sodium: 142 mmol/L (ref 135–145)

## 2018-09-28 MED ORDER — NITROGLYCERIN 0.4 MG SL SUBL
0.4000 mg | SUBLINGUAL_TABLET | SUBLINGUAL | Status: DC | PRN
  Administered 2018-09-28: 0.4 mg via SUBLINGUAL
  Filled 2018-09-28: qty 1

## 2018-09-28 MED ORDER — METOPROLOL TARTRATE 5 MG/5ML IV SOLN
5.0000 mg | Freq: Once | INTRAVENOUS | Status: AC
  Administered 2018-09-28: 5 mg via INTRAVENOUS
  Filled 2018-09-28: qty 5

## 2018-09-28 MED ORDER — HYDROCHLOROTHIAZIDE 12.5 MG PO CAPS
12.5000 mg | ORAL_CAPSULE | Freq: Once | ORAL | Status: AC
  Administered 2018-09-28: 12.5 mg via ORAL
  Filled 2018-09-28: qty 1

## 2018-09-28 MED ORDER — ASPIRIN 81 MG PO CHEW: 324.0000 mg | CHEWABLE_TABLET | Freq: Once | ORAL | Status: DC

## 2018-09-28 MED ORDER — ASPIRIN 81 MG PO CHEW
324.0000 mg | CHEWABLE_TABLET | Freq: Once | ORAL | Status: AC
  Administered 2018-09-28: 324 mg via ORAL
  Filled 2018-09-28: qty 4

## 2018-09-28 MED ORDER — OXYCODONE-ACETAMINOPHEN 5-325 MG PO TABS
1.0000 | ORAL_TABLET | Freq: Once | ORAL | Status: AC
  Administered 2018-09-28: 1 via ORAL
  Filled 2018-09-28: qty 1

## 2018-09-28 NOTE — ED Provider Notes (Signed)
[facility] DEPT Provider Note   CSN: [id number] Arrival date & time: 09/28/18  1735     History   Chief Complaint Chief Complaint  Patient presents with  . Hypertension    HPI Katie [name] is a 72 y.o. female.  HPI  72 yo female with history of hypertension treated for two years with lopressor and cozaar and  Presents today complaining of hypertension.  States started lopressor and now cozaar added over a year ago.  States headaches coming and going with headache in bifrontal region and back with gradual onset.  States bp is checked when she has symptoms.  Some nasal congestion and increased mucus and bronchitis symptoms.  Denies fever.  Some soreness in chest. Headache began around 2 pm but did not take any medications.    Past Medical History:  Diagnosis Date  . AAA (abdominal aortic aneurysm) ([place])   . Arthritis   . Cholelithiasis 2011   s/p cholescystectomy   . COPD (chronic obstructive pulmonary disease) ([facility])    no history of PFTs, only diagnosed by CXR, not on any inhalers because she has not had a PCP in over 2 years  . Emphysema 2010   dx'd/CXR  . GERD (gastroesophageal reflux disease)   . History of heartburn   . History of renal cell carcinoma   . Hypercholesteremia    previously on pravastatin   . Hypertension    previously on Lisinopril/HCTZ  . Murmur    no  prior work-up  . Pneumonia 1980's   "walking"  . Renal cell carcinoma 01/2010   s/p right radical nephrectomy 01/2010,  followed by [facility]  . Shortness of breath 09/26/11   "lately all the time"    Patient Active Problem List   Diagnosis Date Noted  . Urinary urgency 06/11/2018  . Visit for preventive health examination 04/30/2017  . Osteoporosis 12/31/2016  . Arthralgia of both hands 11/11/2016  . Breast cancer screening 11/11/2016  . Hyperglycemia 08/03/2015  . Esophageal reflux   . Depression with anxiety   . Tobacco abuse disorder 06/27/2014    . Insomnia 07/15/2013  . Mitral regurgitation due to cusp prolapse 09/29/2011  . COPD GOLD O  09/26/2011  . Essential hypertension 09/26/2011  . HLD (hyperlipidemia) 09/26/2011  . S/P cholecystectomy 09/26/2011    Past Surgical History:  Procedure Laterality Date  . APPENDECTOMY  1970's  . CARDIAC CATHETERIZATION  09/2011  . CHOLECYSTECTOMY  01/2010  . DIAGNOSTIC LAPAROSCOPY    . LEFT AND RIGHT HEART CATHETERIZATION WITH CORONARY ANGIOGRAM N/A 09/30/2011   Procedure: LEFT AND RIGHT HEART CATHETERIZATION WITH CORONARY ANGIOGRAM;  Surgeon: Candee Furbish, MD;  Location: [facility] CATH LAB;  Service: Cardiovascular;  Laterality: N/A;  . MULTIPLE EXTRACTIONS WITH ALVEOLOPLASTY  10/06/2011   Procedure: MULTIPLE EXTRACION WITH ALVEOLOPLASTY;  Surgeon: Lenn Cal, DDS;  Location: [facility];  Service: Oral Surgery;  Laterality: N/A;  Multiple extraction of tooth #'s 1, 6, 8, 10, 11, 22, 23, 26, 27, 28, 29 with alveoloplasty and Upper right buccal exostoses reductions.  . NEPHRECTOMY RADICAL  01/2010   right   . OVARIAN CYST REMOVAL  1970's   "went through belly button"  . TEE WITHOUT CARDIOVERSION  10/01/2011   Procedure: TRANSESOPHAGEAL ECHOCARDIOGRAM (TEE);  Surgeon: [name];  Location: [facility];  Service: Cardiovascular;  Laterality: N/A;  . TUBAL LIGATION  1970's  . US ECHOCARDIOGRAPHY  09/2011     OB History   None  Home Medications    Prior to Admission medications   Medication Sig Start Date End Date Taking? Authorizing Provider  albuterol (PROVENTIL HFA;VENTOLIN HFA) 108 (90 Base) MCG/ACT inhaler Inhale 1-2 puffs into the lungs every 6 (six) hours as needed for wheezing or shortness of breath. 08/13/16  Yes Brunetta Jeans, PA-C  albuterol (PROVENTIL) (2.5 MG/3ML) 0.083% nebulizer solution Take 3 mLs (2.5 mg total) by nebulization every 6 (six) hours as needed for wheezing or shortness of breath. 03/24/18  Yes Brunetta Jeans, PA-C  atorvastatin (LIPITOR) 40  MG tablet Take 1 tablet (40 mg total) by mouth daily at 6 PM. 06/11/18  Yes Brunetta Jeans, PA-C  budesonide-formoterol [facility]) 160-4.5 MCG/ACT inhaler Inhale 2 puffs into the lungs 2 (two) times daily. 12/04/17  Yes Brunetta Jeans, PA-C  fenofibrate 160 MG tablet Take 1 tablet (160 mg total) by mouth daily. 06/11/18  Yes Brunetta Jeans, PA-C  losartan (COZAAR) 100 MG tablet Take 1 tablet (100 mg total) by mouth daily. 06/11/18 09/28/18 Yes Brunetta Jeans, PA-C  metoprolol tartrate (LOPRESSOR) 25 MG tablet TAKE ONE TABLET BY MOUTH TWICE DAILY 01/18/18  Yes Brunetta Jeans, PA-C    Family History Family History  Problem Relation Age of Onset  . COPD Mother        DECEASED/SMOKED  . Diabetes Brother   . Diabetes Brother     Social History Social History   Tobacco Use  . Smoking status: Current Every Day Smoker    Packs/day: 1.50    Years: 53.00    Pack years: 79.50    Types: Cigarettes  . Smokeless tobacco: Never Used  Substance Use Topics  . Alcohol use: Yes    Alcohol/week: 0.0 standard drinks    Comment:  "drink very rarely"  . Drug use: No    Types: Cocaine    Comment: hx of cocaine last in 2006     Allergies   Ace inhibitors   Review of Systems Review of Systems  All other systems reviewed and are negative.    Physical Exam Updated Vital Signs BP (!) 197/112 (BP Location: Left Arm)   Pulse 67   Temp 98 F (36.7 C) (Oral)   Resp 16   SpO2 98%   Physical Exam  Constitutional: She is oriented to person, place, and time. She appears well-developed and well-nourished.  HENT:  Head: Normocephalic and atraumatic.  Right Ear: External ear normal.  Left Ear: External ear normal.  Eyes: Pupils are equal, round, and reactive to light.  Neck: Normal range of motion. Neck supple.  Cardiovascular: Normal rate and regular rhythm.  Pulmonary/Chest: Effort normal.  Abdominal: Soft. Bowel sounds are normal. She exhibits no distension. There is no  tenderness.  Musculoskeletal: Normal range of motion.  Neurological: She is alert and oriented to person, place, and time.  Skin: Skin is warm and dry. Capillary refill takes less than 2 seconds.  Psychiatric: She has a normal mood and affect.  Nursing note and vitals reviewed.    ED Treatments / Results  Labs (all labs ordered are listed, but only abnormal results are displayed) Labs Reviewed  BASIC METABOLIC PANEL - Abnormal; Notable for the following components:      Result Value   Glucose, Bld 114 (*)    Creatinine, Ser 1.26 (*)    GFR calc non Af Amer 43 (*)    GFR calc Af Amer 50 (*)    All other components within normal limits  CBC  CBG MONITORING, ED  I-STAT TROPONIN, ED  I-STAT TROPONIN, ED    EKG EKG Interpretation  Date/Time:  Tuesday September 28 2018 22:42:31 EST Ventricular Rate:  62 PR Interval:    QRS Duration: 87 QT Interval:  439 QTC Calculation: 446 R Axis:   50 Text Interpretation:  Normal sinus rhythm Non-specific ST-t changes No significant change since last tracing Confirmed by Pattricia Boss [phone]) on 09/28/2018 11:13:26 PM   Radiology Dg Chest 2 View  Result Date: 09/28/2018 CLINICAL DATA:  Chest pain and hypertension EXAM: CHEST - 2 VIEW COMPARISON:  Chest radiograph May 26, 2016 and chest CT August 2019 FINDINGS: There is no appreciable edema or consolidation. The heart size and pulmonary vascularity are normal. No adenopathy. No pneumothorax. There is lower thoracic levoscoliosis. There is mild degenerative change in the lower thoracic spine. There are surgical clips in the upper abdomen on the right posteriorly. IMPRESSION: No edema or consolidation.  Stable cardiac silhouette. Electronically Signed   By: Lowella Grip III M.D.   On: 09/28/2018 19:17    Procedures Procedures (including critical care time)  Medications Ordered in ED Medications - No data to display   Initial Impression / Assessment and Plan / ED Course  I have  reviewed the triage vital signs and the nursing notes.  Pertinent labs & imaging results that were available during my care of the patient were reviewed by me and considered in my medical decision making (see chart for details).     72 yo female with headache, hypertension, aaa .   Now with worsening intermittent chest pain 1- chest pain- atypical , no acute ekg changes, trop and repeat troponin are negative.  Pain resolved here with 1 sublingual nitroglycerin.  Hear score is 4 2 hypertension patient with controlled blood pressure here with systolic in the 875I.  She received Lopressor with improvement.  Sublingual nitro reduced blood pressure systolically to 433 3 headache - with negative head CT.  Doubt subarachnoid hemorrhage given type of headache and onset of symptoms.  However, pain could be related to blood pressure.  Discussed with DR. Blaine Hamper and will see for admission  Final Clinical Impressions(s) / ED Diagnoses   Final diagnoses:  None    ED Discharge Orders    None       Pattricia Boss, MD 09/29/18 [phone]

## 2018-09-28 NOTE — ED Notes (Signed)
Pt reports intermittent central cp x 1 week, she states "I always have it so I am not worried about it."  She also endorses h/a which started in back of her head and neck around 1400 today-now her h/a is in the front - prompting her to check her BP which was really elevated.  She called her PCP which advised her to come to the ED.  Pt is A&Ox 4, in NAD.  She is ambulatory without difficulty.  She reports taking 2 of her BP meds in the morning but has not taken her night dose.

## 2018-09-28 NOTE — Telephone Encounter (Signed)
Pt called with C/O hight BP.  She reported 179/112 and then took it again and it was 189/126 Hr 67.  She also reports having headache to the back of her head, Pt also stated that she has been having a stabbing pain in the center of her chest for several days. She has not missed any of her medications for hypertension. Per protocol pt will go to ED for evaluation. Pt has her daughter to drive and son is with her now.Care advice read to patient.  Patient is verbalized understanding of all instructions.  Reason for Disposition . [3] Systolic BP  >= 709 OR Diastolic >= 643 AND [8] cardiac or neurologic symptoms (e.g., chest pain, difficulty breathing, unsteady gait, blurred vision)  Answer Assessment - Initial Assessment Questions 1. BLOOD PRESSURE: "What is the blood pressure?" "Did you take at least two measurements 5 minutes apart?"     179/112   2. ONSET: "When did you take your blood pressure?"     now 3. HOW: "How did you obtain the blood pressure?" (e.g., visiting nurse, automatic home BP monitor)     Home BP 4. HISTORY: "Do you have a history of high blood pressure?"     yes 5. MEDICATIONS: "Are you taking any medications for blood pressure?" "Have you missed any doses recently?"     no 6. OTHER SYMPTOMS: "Do you have any symptoms?" (e.g., headache, chest pain, blurred vision, difficulty breathing, weakness)     Headache chest pain stab and gone 7. PREGNANCY: "Is there any chance you are pregnant?" "When was your last menstrual period?"     No  Protocols used: HIGH BLOOD PRESSURE-A-AH

## 2018-09-28 NOTE — ED Triage Notes (Signed)
Pt c/o symptomatic HTN for the last couple days. Pt is c/o HA and pain behind her eyes with blurred vision. Denies N/V. Pt also c/o central chest pains for the last few days also.

## 2018-09-29 DIAGNOSIS — J449 Chronic obstructive pulmonary disease, unspecified: Secondary | ICD-10-CM

## 2018-09-29 DIAGNOSIS — R079 Chest pain, unspecified: Secondary | ICD-10-CM

## 2018-09-29 DIAGNOSIS — I160 Hypertensive urgency: Secondary | ICD-10-CM | POA: Diagnosis not present

## 2018-09-29 DIAGNOSIS — Z720 Tobacco use: Secondary | ICD-10-CM

## 2018-09-29 DIAGNOSIS — N183 Chronic kidney disease, stage 3 unspecified: Secondary | ICD-10-CM | POA: Diagnosis present

## 2018-09-29 DIAGNOSIS — N1831 Chronic kidney disease, stage 3a: Secondary | ICD-10-CM | POA: Diagnosis present

## 2018-09-29 DIAGNOSIS — E785 Hyperlipidemia, unspecified: Secondary | ICD-10-CM | POA: Diagnosis not present

## 2018-09-29 LAB — RAPID URINE DRUG SCREEN, HOSP PERFORMED
Amphetamines: NOT DETECTED
Barbiturates: NOT DETECTED
Benzodiazepines: NOT DETECTED
COCAINE: POSITIVE — AB
Opiates: NOT DETECTED
Tetrahydrocannabinol: NOT DETECTED

## 2018-09-29 LAB — LIPID PANEL
Cholesterol: 223 mg/dL — ABNORMAL HIGH (ref 0–200)
HDL: 54 mg/dL (ref >40)
LDL Cholesterol: 115 mg/dL — ABNORMAL HIGH (ref 0–99)
Total CHOL/HDL Ratio: 4.1 RATIO
Triglycerides: 271 mg/dL — ABNORMAL HIGH (ref <150)
VLDL: 54 mg/dL — ABNORMAL HIGH (ref 0–40)

## 2018-09-29 LAB — HEMOGLOBIN A1C
Hgb A1c MFr Bld: 5.1 % (ref 4.8–5.6)
Mean Plasma Glucose: 99.67 mg/dL

## 2018-09-29 LAB — TROPONIN I: Troponin I: <0.03 ng/mL (ref <0.03)

## 2018-09-29 LAB — HIV ANTIBODY (ROUTINE TESTING W REFLEX): HIV SCREEN 4TH GENERATION: NONREACTIVE

## 2018-09-29 LAB — PROTIME-INR
INR: 0.89
Prothrombin Time: 12 seconds (ref 11.4–15.2)

## 2018-09-29 MED ORDER — ASPIRIN EC 81 MG PO TBEC: 81.0000 mg | DELAYED_RELEASE_TABLET | Freq: Every day | ORAL | 0 refills | Status: DC

## 2018-09-29 MED ORDER — METOPROLOL TARTRATE 25 MG PO TABS
25.0000 mg | ORAL_TABLET | Freq: Two times a day (BID) | ORAL | Status: DC
  Administered 2018-09-29: 25 mg via ORAL
  Filled 2018-09-29: qty 1

## 2018-09-29 MED ORDER — IPRATROPIUM-ALBUTEROL 0.5-2.5 (3) MG/3ML IN SOLN
3.0000 mL | Freq: Three times a day (TID) | RESPIRATORY_TRACT | Status: DC
  Administered 2018-09-29: 3 mL via RESPIRATORY_TRACT
  Filled 2018-09-29: qty 3

## 2018-09-29 MED ORDER — ONDANSETRON HCL 4 MG/2ML IJ SOLN: 4.0000 mg | Freq: Four times a day (QID) | INTRAMUSCULAR | Status: DC | PRN

## 2018-09-29 MED ORDER — ATORVASTATIN CALCIUM 40 MG PO TABS
40.0000 mg | ORAL_TABLET | Freq: Every day | ORAL | Status: DC
  Filled 2018-09-29: qty 1

## 2018-09-29 MED ORDER — FENOFIBRATE 160 MG PO TABS
160.0000 mg | ORAL_TABLET | Freq: Every day | ORAL | Status: DC
  Filled 2018-09-29: qty 1

## 2018-09-29 MED ORDER — ALBUTEROL SULFATE (2.5 MG/3ML) 0.083% IN NEBU: 2.5000 mg | INHALATION_SOLUTION | Freq: Four times a day (QID) | RESPIRATORY_TRACT | Status: DC | PRN

## 2018-09-29 MED ORDER — ASPIRIN 325 MG PO TABS
325.0000 mg | ORAL_TABLET | Freq: Every day | ORAL | Status: DC
  Administered 2018-09-29: 325 mg via ORAL
  Filled 2018-09-29: qty 1

## 2018-09-29 MED ORDER — DM-GUAIFENESIN ER 30-600 MG PO TB12
1.0000 | ORAL_TABLET | Freq: Two times a day (BID) | ORAL | Status: DC
  Administered 2018-09-29: 1 via ORAL
  Filled 2018-09-29 (×2): qty 1

## 2018-09-29 MED ORDER — DM-GUAIFENESIN ER 30-600 MG PO TB12
1.0000 | ORAL_TABLET | Freq: Two times a day (BID) | ORAL | Status: DC | PRN
  Filled 2018-09-29: qty 1

## 2018-09-29 MED ORDER — LOSARTAN POTASSIUM 100 MG PO TABS: 100.0000 mg | ORAL_TABLET | Freq: Every day | ORAL | 0 refills | Status: DC

## 2018-09-29 MED ORDER — MOMETASONE FURO-FORMOTEROL FUM 200-5 MCG/ACT IN AERO
2.0000 | INHALATION_SPRAY | Freq: Two times a day (BID) | RESPIRATORY_TRACT | Status: DC
  Filled 2018-09-29: qty 8.8

## 2018-09-29 MED ORDER — HYDROCHLOROTHIAZIDE 12.5 MG PO TABS: 12.5000 mg | ORAL_TABLET | Freq: Every day | ORAL | 0 refills | Status: DC

## 2018-09-29 MED ORDER — IPRATROPIUM-ALBUTEROL 0.5-2.5 (3) MG/3ML IN SOLN
3.0000 mL | Freq: Four times a day (QID) | RESPIRATORY_TRACT | Status: DC
  Administered 2018-09-29: 3 mL via RESPIRATORY_TRACT

## 2018-09-29 MED ORDER — HYDRALAZINE HCL 20 MG/ML IJ SOLN: 5.0000 mg | INTRAMUSCULAR | Status: DC | PRN

## 2018-09-29 MED ORDER — METOPROLOL TARTRATE 25 MG PO TABS: 25.0000 mg | ORAL_TABLET | Freq: Two times a day (BID) | ORAL | 0 refills | Status: DC

## 2018-09-29 MED ORDER — LOSARTAN POTASSIUM 50 MG PO TABS
100.0000 mg | ORAL_TABLET | Freq: Every day | ORAL | Status: DC
  Filled 2018-09-29: qty 2

## 2018-09-29 MED ORDER — NICOTINE 21 MG/24HR TD PT24: 21.0000 mg | MEDICATED_PATCH | Freq: Every day | TRANSDERMAL | Status: DC

## 2018-09-29 MED ORDER — ENOXAPARIN SODIUM 40 MG/0.4ML ~~LOC~~ SOLN
40.0000 mg | SUBCUTANEOUS | Status: DC
  Administered 2018-09-29: 40 mg via SUBCUTANEOUS
  Filled 2018-09-29: qty 0.4

## 2018-09-29 MED ORDER — ASPIRIN 81 MG PO CHEW: 324.0000 mg | CHEWABLE_TABLET | Freq: Once | ORAL | Status: DC

## 2018-09-29 MED ORDER — ZOLPIDEM TARTRATE 5 MG PO TABS
5.0000 mg | ORAL_TABLET | Freq: Every evening | ORAL | Status: DC | PRN
  Administered 2018-09-29: 5 mg via ORAL
  Filled 2018-09-29: qty 1

## 2018-09-29 MED ORDER — MORPHINE SULFATE (PF) 2 MG/ML IV SOLN
1.0000 mg | INTRAVENOUS | Status: DC | PRN
  Administered 2018-09-29: 1 mg via INTRAVENOUS
  Filled 2018-09-29: qty 1

## 2018-09-29 NOTE — Discharge Summary (Signed)
[facility]  Physician Discharge Summary   Patient ID: Katie [name] MRN: [id number] DOB/AGE: 1946/01/04 72 y.o.  Admit date: 09/28/2018 Discharge date: 09/29/2018  PCP: Brunetta Jeans, PA-C  DISCHARGE DIAGNOSES:  Chest pain most likely due to elevated blood pressure, resolved Hypertensive urgency, improved Hyperlipidemia COPD Tobacco abuse Chronic kidney disease stage III   RECOMMENDATIONS FOR OUTPATIENT FOLLOW UP: 1. Patient asked to follow-up with her PCP 2. She was asked to be compliant with her home medication regimen 3. HCTZ was added to her antihypertensive regimen   DISCHARGE CONDITION: fair  Diet recommendation: Heart healthy  Filed Weights   09/28/18 2316  Weight: 70.3 kg    INITIAL HISTORY: Katie [name] is a 72 y.o. female with medical history significant of hypertension, hyperlipidemia, COPD, GERD, AAA, tobacco abuse, dCHF, CKD 3, renal cell cancer (s/p of right radical nephrectomy, no chemo or radiation therapy), who presented with elevated blood pressure and chest pain.  Patient stated that she was taking metoprolol and Cozaar for blood pressure, but her blood pressure was not controlled recently.    ED Course: pt was found to have negative troponin, WBC 5.6, stable renal function, temperature normal, bradycardia, oxygen saturation 96% on room air.  X-ray is negative.  CT head is negative for acute intracranial abnormalities.  Patient is placed on telemetry bed of observation.   HOSPITAL COURSE:   Hypertensive urgency Blood pressure was 197/112 initially, which improved to 144/67.  Patient was given 1 dose of HCTZ and IV metoprolol in ED. She was placed back on her home medications including Cozaar and metoprolol.  She was also given IV hydralazine as needed.  Blood pressure is much better controlled now.  She denies noncompliance with her medications.  She will be asked to continue with her home medications of metoprolol and Cozaar and  will be given a prescription for hydrochlorothiazide.  Chest pain This is most likely due to elevated blood pressure.  Troponins have been normal.  EKG did not show any ischemic changes.  Chest pain is resolved this morning.  Of note patient had a normal/low risk stress test in 2017.  Outpatient follow-up with PCP.  Hyperlipidemia  Continue home medications.  LDL was noted to be 115.  She was asked to discuss this with her PCP.  COPD Patient had mild wheezing in the emergency department treated with nebulizer treatments.  Chest x-ray was unremarkable.  Lungs are clear to auscultation this morning.  She was asked to stop smoking cigarettes.    Tobacco abuse Patient was counseled.  CKD (chronic kidney disease) stage 3, GFR 30-59 ml/min Stable.  Baseline creatinine 1.2-1.3.    Overall stable.  Okay for discharge home today.   PERTINENT LABS:  The results of significant diagnostics from this hospitalization (including imaging, microbiology, ancillary and laboratory) are listed below for reference.     Labs: Basic Metabolic Panel: Recent Labs  Lab 09/28/18 1802  NA 142  K 4.3  CL 107  CO2 24  GLUCOSE 114*  BUN 20  CREATININE 1.26*  CALCIUM 9.3   CBC: Recent Labs  Lab 09/28/18 1802  WBC 5.6  HGB 13.2  HCT 42.1  MCV 91.1  PLT 267   Cardiac Enzymes: Recent Labs  Lab 09/29/18 0229 09/29/18 1337  TROPONINI <0.03 <0.03     IMAGING STUDIES Dg Chest 2 View  Result Date: 09/28/2018 CLINICAL DATA:  Chest pain and hypertension EXAM: CHEST - 2 VIEW COMPARISON:  Chest radiograph May 26, 2016 and chest CT  August 2019 FINDINGS: There is no appreciable edema or consolidation. The heart size and pulmonary vascularity are normal. No adenopathy. No pneumothorax. There is lower thoracic levoscoliosis. There is mild degenerative change in the lower thoracic spine. There are surgical clips in the upper abdomen on the right posteriorly. IMPRESSION: No edema or consolidation.   Stable cardiac silhouette. Electronically Signed   By: Lowella Grip III M.D.   On: 09/28/2018 19:17   Ct Head Wo Contrast  Result Date: 09/28/2018 CLINICAL DATA:  72 year old female with a headache and blurry vision. EXAM: CT HEAD WITHOUT CONTRAST TECHNIQUE: Contiguous axial images were obtained from the base of the skull through the vertex without intravenous contrast. COMPARISON:  None. FINDINGS: Brain: There is mild age-related atrophy and chronic microvascular ischemic changes. There is no acute intracranial hemorrhage. No mass effect or midline shift. No extra-axial fluid collection. Vascular: No hyperdense vessel or unexpected calcification. Skull: Normal. Negative for fracture or focal lesion. Sinuses/Orbits: No acute finding. Other: None IMPRESSION: 1. No acute intracranial hemorrhage. 2. Mild age-related atrophy and chronic microvascular ischemic changes. Electronically Signed   By: Anner Crete M.D.   On: 09/28/2018 22:55    DISCHARGE EXAMINATION: Vitals:   09/29/18 1400 09/29/18 1430 09/29/18 1502 09/29/18 1545  BP: 117/75 121/68 114/74 118/76  Pulse: 62 62 77 78  Resp: 17 16 16 16   Temp:      TempSrc:      SpO2: 97% 96% 98% 98%  Weight:      Height:       General appearance: alert, cooperative, appears stated age and no distress Resp: clear to auscultation bilaterally Cardio: regular rate and rhythm, S1, S2 normal, no murmur, click, rub or gallop GI: soft, non-tender; bowel sounds normal; no masses,  no organomegaly  DISPOSITION: Home  Discharge Instructions    Call MD for:  difficulty breathing, headache or visual disturbances   Complete by:  As directed    Call MD for:  extreme fatigue   Complete by:  As directed    Call MD for:  persistant dizziness or light-headedness   Complete by:  As directed    Call MD for:  persistant nausea and vomiting   Complete by:  As directed    Call MD for:  severe uncontrolled pain   Complete by:  As directed    Call MD for:   temperature >100.4   Complete by:  As directed    Diet - low sodium heart healthy   Complete by:  As directed    Discharge instructions   Complete by:  As directed    Your cholesterol levels are poorly controlled.  Please continue taking the cholesterol medication and discuss this with your primary care provider.  Take your blood pressure medications as prescribed.  Seek attention immediately if your chest pain recurs.  Your PCP can consider referring you to a cardiologist.  You were cared for by a hospitalist during your hospital stay. If you have any questions about your discharge medications or the care you received while you were in the hospital after you are discharged, you can call the unit and asked to speak with the hospitalist on call if the hospitalist that took care of you is not available. Once you are discharged, your primary care physician will handle any further medical issues. Please note that NO REFILLS for any discharge medications will be authorized once you are discharged, as it is imperative that you return to your primary care physician (or  establish a relationship with a primary care physician if you do not have one) for your aftercare needs so that they can reassess your need for medications and monitor your lab values. If you do not have a primary care physician, you can call [phone] for a physician referral.   Increase activity slowly   Complete by:  As directed         Allergies as of 09/29/2018      Reactions   Ace Inhibitors    Pseudoasthma/ renal failure      Medication List    TAKE these medications   albuterol 108 (90 Base) MCG/ACT inhaler Commonly known as:  PROVENTIL HFA;VENTOLIN HFA Inhale 1-2 puffs into the lungs every 6 (six) hours as needed for wheezing or shortness of breath.   albuterol (2.5 MG/3ML) 0.083% nebulizer solution Commonly known as:  PROVENTIL Take 3 mLs (2.5 mg total) by nebulization every 6 (six) hours as needed for wheezing or  shortness of breath.   aspirin EC 81 MG tablet Take 1 tablet (81 mg total) by mouth daily.   atorvastatin 40 MG tablet Commonly known as:  LIPITOR Take 1 tablet (40 mg total) by mouth daily at 6 PM.   budesonide-formoterol 160-4.5 MCG/ACT inhaler Commonly known as:  SYMBICORT Inhale 2 puffs into the lungs 2 (two) times daily.   fenofibrate 160 MG tablet Take 1 tablet (160 mg total) by mouth daily.   hydrochlorothiazide 12.5 MG tablet Commonly known as:  HYDRODIURIL Take 1 tablet (12.5 mg total) by mouth daily.   losartan 100 MG tablet Commonly known as:  COZAAR Take 1 tablet (100 mg total) by mouth daily.   metoprolol tartrate 25 MG tablet Commonly known as:  LOPRESSOR Take 1 tablet (25 mg total) by mouth 2 (two) times daily.        Follow-up Information    Brunetta Jeans, PA-C. Schedule an appointment as soon as possible for a visit in 1 week(s).   Specialty:  Family Medicine Contact information: [address] [place] [address] [phone]           TOTAL DISCHARGE TIME: 35 minutes  Bonnielee Haff  [facility] Pager [phone]  09/29/2018, 3:46 PM

## 2018-09-29 NOTE — H&P (Signed)
History and Physical    Katie [name] MWU:[id number] DOB: [birth year] DOA: 09/28/2018  Referring MD/NP/PA:   PCP: Brunetta Jeans, PA-C   Patient coming from:  The patient is coming from home.  At baseline, pt is independent for most of ADL.        Chief Complaint: Elevated blood pressure and chest pain  HPI: Katie [name] is a 72 y.o. female with medical history significant of hypertension, hyperlipidemia, COPD, GERD, AAA, tobacco abuse, dCHF, CKD 3, renal cell cancer (s/p of right radical nephrectomy, no chemo or radiation therapy), who presents with elevated blood pressure and chest pain.  Patient states that she is taking metoprolol and Cozaar for blood pressure, but her blood pressure was not controlled recently.  She has blood pressure 197/112 in ED. She also reports intermittent chest pain recently.  It is located in the substernal area, 7 out of 10 severity, sharp and pressure-like pain, sometimes radiating to the left arm.  Currently she is chest pain-free.  Patient has dry cough due to COPD, but no fever or chills.  Patient states that she has headache in bilateral frontal and occipital area.  She also had blurry vision recently, which has completely resolved.  Currently patient does not have vision loss, hearing loss, facial droop, slurred speech, unilateral weakness or numbness in extremities.  Patient denies nausea vomiting, diarrhea, abdominal pain, symptoms of UTI. Of note, pt had negative NM stress test on 04/18/16.  ED Course: pt was found to have negative troponin, WBC 5.6, stable renal function, temperature normal, bradycardia, oxygen saturation 96% on room air.  X-ray is negative.  CT head is negative for acute intracranial abnormalities.  Patient is placed on telemetry bed of observation.  Review of Systems:   General: no fevers, chills, no body weight gain, has fatigue and HA HEENT: no blurry vision, hearing changes or sore throat Respiratory: no dyspnea, has  coughing, no wheezing CV: has chest pain, no palpitations GI: no nausea, vomiting, abdominal pain, diarrhea, constipation GU: no dysuria, burning on urination, increased urinary frequency, hematuria  Ext: no leg edema Neuro: currently no unilateral weakness, numbness, or tingling, no vision change or hearing loss  Skin: no rash, no skin tear. MSK: No muscle spasm, no deformity, no limitation of range of movement in spin Heme: No easy bruising.  Travel history: No recent long distant travel.  Allergy:  Allergies  Allergen Reactions  . Ace Inhibitors     Pseudoasthma/ renal failure    Past Medical History:  Diagnosis Date  . AAA (abdominal aortic aneurysm) ([place])   . Arthritis   . Cholelithiasis 2011   s/p cholescystectomy   . COPD (chronic obstructive pulmonary disease) ([facility])    no history of PFTs, only diagnosed by CXR, not on any inhalers because she has not had a PCP in over 2 years  . Emphysema 2010   dx'd/CXR  . GERD (gastroesophageal reflux disease)   . History of heartburn   . History of renal cell carcinoma   . Hypercholesteremia    previously on pravastatin   . Hypertension    previously on Lisinopril/HCTZ  . Murmur    no  prior work-up  . Pneumonia 1980's   "walking"  . Renal cell carcinoma 01/2010   s/p right radical nephrectomy 01/2010,  followed by [facility]  . Shortness of breath 09/26/11   "lately all the time"    Past Surgical History:  Procedure Laterality Date  . APPENDECTOMY  1970's  . CARDIAC  CATHETERIZATION  09/2011  . CHOLECYSTECTOMY  01/2010  . DIAGNOSTIC LAPAROSCOPY    . LEFT AND RIGHT HEART CATHETERIZATION WITH CORONARY ANGIOGRAM N/A 09/30/2011   Procedure: LEFT AND RIGHT HEART CATHETERIZATION WITH CORONARY ANGIOGRAM;  Surgeon: Candee Furbish, MD;  Location: [facility] CATH LAB;  Service: Cardiovascular;  Laterality: N/A;  . MULTIPLE EXTRACTIONS WITH ALVEOLOPLASTY  10/06/2011   Procedure: MULTIPLE EXTRACION WITH ALVEOLOPLASTY;  Surgeon: Lenn Cal, DDS;  Location: [facility];  Service: Oral Surgery;  Laterality: N/A;  Multiple extraction of tooth #'s 1, 6, 8, 10, 11, 22, 23, 26, 27, 28, 29 with alveoloplasty and Upper right buccal exostoses reductions.  . NEPHRECTOMY RADICAL  01/2010   right   . OVARIAN CYST REMOVAL  1970's   "went through belly button"  . TEE WITHOUT CARDIOVERSION  10/01/2011   Procedure: TRANSESOPHAGEAL ECHOCARDIOGRAM (TEE);  Surgeon: [name];  Location: [facility];  Service: Cardiovascular;  Laterality: N/A;  . TUBAL LIGATION  1970's  . US ECHOCARDIOGRAPHY  09/2011    Social History:  reports that she has been smoking cigarettes. She has a 79.50 pack-year smoking history. She has never used smokeless tobacco. She reports that she drinks alcohol. She reports that she does not use drugs.  Family History:  Family History  Problem Relation Age of Onset  . COPD Mother        DECEASED/SMOKED  . Diabetes Brother   . Diabetes Brother      Prior to Admission medications   Medication Sig Start Date End Date Taking? Authorizing Provider  albuterol (PROVENTIL HFA;VENTOLIN HFA) 108 (90 Base) MCG/ACT inhaler Inhale 1-2 puffs into the lungs every 6 (six) hours as needed for wheezing or shortness of breath. 08/13/16  Yes Brunetta Jeans, PA-C  albuterol (PROVENTIL) (2.5 MG/3ML) 0.083% nebulizer solution Take 3 mLs (2.5 mg total) by nebulization every 6 (six) hours as needed for wheezing or shortness of breath. 03/24/18  Yes Brunetta Jeans, PA-C  atorvastatin (LIPITOR) 40 MG tablet Take 1 tablet (40 mg total) by mouth daily at 6 PM. 06/11/18  Yes Brunetta Jeans, PA-C  budesonide-formoterol [facility]) 160-4.5 MCG/ACT inhaler Inhale 2 puffs into the lungs 2 (two) times daily. 12/04/17  Yes Brunetta Jeans, PA-C  fenofibrate 160 MG tablet Take 1 tablet (160 mg total) by mouth daily. 06/11/18  Yes Brunetta Jeans, PA-C  losartan (COZAAR) 100 MG tablet Take 1 tablet (100 mg total) by mouth daily. 06/11/18  09/28/18 Yes Brunetta Jeans, PA-C  metoprolol tartrate (LOPRESSOR) 25 MG tablet TAKE ONE TABLET BY MOUTH TWICE DAILY 01/18/18  Yes Brunetta Jeans, [place]    Physical Exam: Vitals:   09/29/18 0345 09/29/18 0400 09/29/18 0415 09/29/18 0430  BP: 137/85 120/75 115/71 107/79  Pulse:      Resp: 13 13 14 14   Temp:      TempSrc:      SpO2:      Weight:      Height:       General: Not in acute distress HEENT:       Eyes: PERRL, EOMI, no scleral icterus.       ENT: No discharge from the ears and nose, no pharynx injection, no tonsillar enlargement.        Neck: No JVD, no bruit, no mass felt. Heme: No neck lymph node enlargement. Cardiac: S1/S2, RRR, No murmurs, No gallops or rubs. Respiratory: has mild wheezing bilaterally. GI: Soft, nondistended, nontender, no rebound pain, no organomegaly, BS present.  GU: No hematuria Ext: No pitting leg edema bilaterally. 2+DP/PT pulse bilaterally. Musculoskeletal: No joint deformities, No joint redness or warmth, no limitation of ROM in spin. Skin: No rashes.  Neuro: Alert, oriented X3, cranial nerves II-XII grossly intact, moves all extremities normally.  Psych: Patient is not psychotic, no suicidal or hemocidal ideation.  Labs on Admission: I have personally reviewed following labs and imaging studies  CBC: Recent Labs  Lab 09/28/18 1802  WBC 5.6  HGB 13.2  HCT 42.1  MCV 91.1  PLT 354   Basic Metabolic Panel: Recent Labs  Lab 09/28/18 1802  NA 142  K 4.3  CL 107  CO2 24  GLUCOSE 114*  BUN 20  CREATININE 1.26*  CALCIUM 9.3   GFR: Estimated Creatinine Clearance: 37.3 mL/min (A) (by C-G formula based on SCr of 1.26 mg/dL (H)). Liver Function Tests: No results for input(s): AST, ALT, ALKPHOS, BILITOT, PROT, ALBUMIN in the last 168 hours. No results for input(s): LIPASE, AMYLASE in the last 168 hours. No results for input(s): AMMONIA in the last 168 hours. Coagulation Profile: Recent Labs  Lab 09/29/18 0229  INR 0.89    Cardiac Enzymes: Recent Labs  Lab 09/29/18 0229  TROPONINI <0.03   BNP (last 3 results) No results for input(s): PROBNP in the last 8760 hours. HbA1C: Recent Labs    09/29/18 0230  HGBA1C 5.1   CBG: No results for input(s): GLUCAP in the last 168 hours. Lipid Profile: Recent Labs    09/29/18 0230  CHOL 223*  HDL 54  LDLCALC 115*  TRIG 271*  CHOLHDL 4.1   Thyroid Function Tests: No results for input(s): TSH, T4TOTAL, FREET4, T3FREE, THYROIDAB in the last 72 hours. Anemia Panel: No results for input(s): VITAMINB12, FOLATE, FERRITIN, TIBC, IRON, RETICCTPCT in the last 72 hours. Urine analysis:    Component Value Date/Time   COLORURINE YELLOW 04/29/2017 1122   APPEARANCEUR CLEAR 04/29/2017 1122   LABSPEC 1.015 04/29/2017 1122   PHURINE 6.0 04/29/2017 1122   GLUCOSEU NEGATIVE 04/29/2017 1122   HGBUR NEGATIVE 04/29/2017 1122   BILIRUBINUR negative 06/11/2018 0911   KETONESUR NEGATIVE 04/29/2017 1122   PROTEINUR Negative 06/11/2018 0911   PROTEINUR NEG 08/08/2013 0811   UROBILINOGEN 0.2 06/11/2018 0911   UROBILINOGEN 0.2 04/29/2017 1122   NITRITE negative 06/11/2018 0911   NITRITE NEGATIVE 04/29/2017 1122   LEUKOCYTESUR Trace (A) 06/11/2018 0911   Sepsis Labs: @LABRCNTIP (procalcitonin:4,lacticidven:4) )No results found for this or any previous visit (from the past 240 hour(s)).   Radiological Exams on Admission: Dg Chest 2 View  Result Date: 09/28/2018 CLINICAL DATA:  Chest pain and hypertension EXAM: CHEST - 2 VIEW COMPARISON:  Chest radiograph May 26, 2016 and chest CT August 2019 FINDINGS: There is no appreciable edema or consolidation. The heart size and pulmonary vascularity are normal. No adenopathy. No pneumothorax. There is lower thoracic levoscoliosis. There is mild degenerative change in the lower thoracic spine. There are surgical clips in the upper abdomen on the right posteriorly. IMPRESSION: No edema or consolidation.  Stable cardiac silhouette.  Electronically Signed   By: Lowella Grip III M.D.   On: 09/28/2018 19:17   Ct Head Wo Contrast  Result Date: 09/28/2018 CLINICAL DATA:  72 year old female with a headache and blurry vision. EXAM: CT HEAD WITHOUT CONTRAST TECHNIQUE: Contiguous axial images were obtained from the base of the skull through the vertex without intravenous contrast. COMPARISON:  None. FINDINGS: Brain: There is mild age-related atrophy and chronic microvascular ischemic changes. There is no acute  intracranial hemorrhage. No mass effect or midline shift. No extra-axial fluid collection. Vascular: No hyperdense vessel or unexpected calcification. Skull: Normal. Negative for fracture or focal lesion. Sinuses/Orbits: No acute finding. Other: None IMPRESSION: 1. No acute intracranial hemorrhage. 2. Mild age-related atrophy and chronic microvascular ischemic changes. Electronically Signed   By: Anner Crete M.D.   On: 09/28/2018 22:55     EKG: Independently reviewed.  Sinus rhythm, QTC 446, nonspecific T wave change.  Assessment/Plan Principal Problem:   Hypertensive urgency Active Problems:   COPD GOLD O    HLD (hyperlipidemia)   Tobacco abuse disorder   Chest pain   CKD (chronic kidney disease) stage 3, GFR 30-59 ml/min (H[facility])   Hypertensive urgency: Blood pressure was 197/112 initially, which improved to 144/67.  Patient was given 1 dose of HCTZ and IV metoprolol in ED.  -Will place on tele bed for obs -Continue home Cozaar and metoprolol -IV hydralazine PRN --goal of bp reduction is by about 25 to 30% in first several hours  Chest pain: Likely due to demand ischemia secondary to hypertensive urgency.  Currently patient is chest pain-free.  Troponin negative. - cycle CE q6 x3 and repeat EKG in the am  - prn Nitroglycerin, Morphine, and aspirin, lipitor, metoprolol - Risk factor stratification: will check FLP and A1C, UDS - did not order 2d echo--> please reevaluate pt in AM to decide if pt needs 2D  echo.  HDL -fenofibrate, Lipitor  COPD: pt has mild wheezing, but no acute respiratory distress.  Chest x-ray negative.  Does not seem to have acute exacerbation. -Bronchodilators and PRN Mucinex for cough  Tobacco abuse: -Did counseling about importance of quitting smoking -Nicotine patch  CKD (chronic kidney disease) stage 3, GFR 30-59 ml/min ([place]): Stable.  Baseline creatinine 1.2-1.3.  Her creatinine is 1.26, BUN 22 -Follow-up renal function by BMP    DVT ppx:  SQ Lovenox Code Status: Full code Family Communication: None at bed side.  Disposition Plan:  Anticipate discharge back to previous home environment Consults called:  none Admission status: Obs / tele    Date of Service 09/29/2018    Ivor Costa [facility] Pager [phone]  If 7PM-7AM, please contact night-coverage [email or web address] Password [email or web address] 09/29/2018, 5:37 AM

## 2018-09-29 NOTE — ED Notes (Signed)
Pt ambulated to bathroom without distress. Did c/o minor dizziness.

## 2018-09-29 NOTE — ED Notes (Signed)
Pt provided breakfast tray.

## 2018-10-04 ENCOUNTER — Encounter: Payer: Self-pay | Admitting: Physician Assistant

## 2018-10-04 ENCOUNTER — Ambulatory Visit (INDEPENDENT_AMBULATORY_CARE_PROVIDER_SITE_OTHER): Payer: Medicare Other | Admitting: Physician Assistant

## 2018-10-04 ENCOUNTER — Other Ambulatory Visit: Payer: Self-pay

## 2018-10-04 VITALS — BP 138/88 | HR 65 | Temp 98.4°F | Resp 18 | Wt 144.4 lb

## 2018-10-04 DIAGNOSIS — J9801 Acute bronchospasm: Secondary | ICD-10-CM

## 2018-10-04 DIAGNOSIS — B9789 Other viral agents as the cause of diseases classified elsewhere: Secondary | ICD-10-CM

## 2018-10-04 DIAGNOSIS — J069 Acute upper respiratory infection, unspecified: Secondary | ICD-10-CM | POA: Diagnosis not present

## 2018-10-04 DIAGNOSIS — Z23 Encounter for immunization: Secondary | ICD-10-CM

## 2018-10-04 DIAGNOSIS — I10 Essential (primary) hypertension: Secondary | ICD-10-CM | POA: Diagnosis not present

## 2018-10-04 DIAGNOSIS — F5101 Primary insomnia: Secondary | ICD-10-CM | POA: Diagnosis not present

## 2018-10-04 DIAGNOSIS — Z720 Tobacco use: Secondary | ICD-10-CM

## 2018-10-04 LAB — BASIC METABOLIC PANEL
BUN: 16 mg/dL (ref 6–23)
CO2: 27 mEq/L (ref 19–32)
Calcium: 9.4 mg/dL (ref 8.4–10.5)
Chloride: 105 mEq/L (ref 96–112)
Creatinine, Ser: 1.5 mg/dL — ABNORMAL HIGH (ref 0.40–1.20)
GFR: 36.41 mL/min — AB (ref >60.00)
GLUCOSE: 100 mg/dL — AB (ref 70–99)
Potassium: 3.9 mEq/L (ref 3.5–5.1)
Sodium: 139 mEq/L (ref 135–145)

## 2018-10-04 MED ORDER — BENZONATATE 100 MG PO CAPS: 100.0000 mg | ORAL_CAPSULE | Freq: Three times a day (TID) | ORAL | 0 refills | Status: DC | PRN

## 2018-10-04 MED ORDER — IPRATROPIUM-ALBUTEROL 0.5-2.5 (3) MG/3ML IN SOLN
3.0000 mL | Freq: Once | RESPIRATORY_TRACT | Status: AC
  Administered 2018-10-04: 3 mL via RESPIRATORY_TRACT

## 2018-10-04 MED ORDER — SUVOREXANT 10 MG PO TABS: 10.0000 mg | ORAL_TABLET | Freq: Every day | ORAL | 0 refills | Status: DC

## 2018-10-04 NOTE — Patient Instructions (Signed)
Please go to the lab today for blood work.  I will call you with your results. We will alter treatment regimen(s) if indicated by your results.   Please go to the pharmacy and pick up the additional BP medication (hydrochlorothiazide). Take as directed along with your regular medication. Stop the sleep aid. I have sent in a prescription for a safer sleep medication for you.   Keep hydrated and get plenty of rest. Restart Symbicort daily as directed. Continue plain Mucinex 1200 mg twice daily. Start the prescription cough medication as directed.  For the lip, apply vaseline to the area twice daily. Monitor the area overnight.  If getting larger, please call me.   Follow-up 2 weeks for reassessment of BP.

## 2018-10-04 NOTE — Progress Notes (Signed)
Patient presents to clinic today for hospital follow-up. Patient presented to the ER on 09/28/18 with complaints of elevated BP and chest pain. ER workup included EKG (negative for ischemia or acute changes), CXR (negative), CT Head (negative for acute intracranial abnormality), stable renal function and negative troponin. Patient was given HCTZ and IUV metoprolol in ED. Patient was subsequently admitted for observation. Hospital course included cycling of troponin (negative) and monitoring of BP which normalized. Patient was discharged home on home BP medications as well as to start low-dose HCTZ daily.  Since discharge, patient endorses feeling better. Denies any recurrence of chest pain. Patient denies chest pain, palpitations, lightheadedness, dizziness, vision changes or frequent headaches. Notes BP has been elevated up to 175Z systolic in the morning after medication. On questioning, she states she did not pick up or start the HCTZ as she did not know she was supposed to. Has noted 3 days of cough and wheeze. Is not using her Symbicort daily as directed. Patient would also like to discuss alternative medications for her chronic insomnia. Patient continues to smoke 1 ppd and declines discussion of cessation.  Past Medical History:  Diagnosis Date  . AAA (abdominal aortic aneurysm) ([place])   . Arthritis   . Cholelithiasis 2011   s/p cholescystectomy   . COPD (chronic obstructive pulmonary disease) (HCC)    no history of PFTs, only diagnosed by CXR, not on any inhalers because she has not had a PCP in over 2 years  . Emphysema 2010   dx'd/CXR  . GERD (gastroesophageal reflux disease)   . History of heartburn   . History of renal cell carcinoma   . Hypercholesteremia    previously on pravastatin   . Hypertension    previously on Lisinopril/HCTZ  . Murmur    no  prior work-up  . Pneumonia 1980's   "walking"  . Renal cell carcinoma 01/2010   s/p right radical nephrectomy 01/2010,   followed by [facility]  . Shortness of breath 09/26/11   "lately all the time"    Current Outpatient Medications on File Prior to Visit  Medication Sig Dispense Refill  . albuterol (PROVENTIL HFA;VENTOLIN HFA) 108 (90 Base) MCG/ACT inhaler Inhale 1-2 puffs into the lungs every 6 (six) hours as needed for wheezing or shortness of breath. 3 Inhaler 1  . albuterol (PROVENTIL) (2.5 MG/3ML) 0.083% nebulizer solution Take 3 mLs (2.5 mg total) by nebulization every 6 (six) hours as needed for wheezing or shortness of breath. 150 mL 0  . aspirin EC 81 MG tablet Take 1 tablet (81 mg total) by mouth daily. 30 tablet 0  . atorvastatin (LIPITOR) 40 MG tablet Take 1 tablet (40 mg total) by mouth daily at 6 PM. 90 tablet 1  . budesonide-formoterol (SYMBICORT) 160-4.5 MCG/ACT inhaler Inhale 2 puffs into the lungs 2 (two) times daily. 3 Inhaler 1  . diphenhydrAMINE (NIGHTTIME SLEEP AID) 25 MG tablet Take 50 mg by mouth at bedtime as needed for sleep.    . fenofibrate 160 MG tablet Take 1 tablet (160 mg total) by mouth daily. 90 tablet 1  . hydrochlorothiazide (HYDRODIURIL) 12.5 MG tablet Take 1 tablet (12.5 mg total) by mouth daily. 30 tablet 0  . losartan (COZAAR) 100 MG tablet Take 1 tablet (100 mg total) by mouth daily. 30 tablet 0  . metoprolol tartrate (LOPRESSOR) 25 MG tablet Take 1 tablet (25 mg total) by mouth 2 (two) times daily. 60 tablet 0   No current facility-administered medications on file  prior to visit.     Allergies  Allergen Reactions  . Ace Inhibitors     Pseudoasthma/ renal failure    Family History  Problem Relation Age of Onset  . COPD Mother        DECEASED/SMOKED  . Diabetes Brother   . Diabetes Brother     Social History   Socioeconomic History  . Marital status: Married    Spouse name: Not on file  . Number of children: 3  . Years of education: 56  . Highest education level: Not on file  Occupational History  . 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 Financial resource strain: Not on file  . Food insecurity:    Worry: Not on file    Inability: Not on file  . Transportation needs:    Medical: Not on file    Non-medical: Not on file  Tobacco Use  . Smoking status: Current Every Day Smoker    Packs/day: 1.00    Years: 53.00    Pack years: 53.00    Types: Cigarettes  . Smokeless tobacco: Never Used  Substance and Sexual Activity  . Alcohol use: Yes    Alcohol/week: 0.0 standard drinks    Comment:  "drink very rarely"  . Drug use: No    Types: Cocaine    Comment: hx of cocaine last in 2006  . Sexual activity: Never  Lifestyle  . Physical activity:    Days per week: Not on file    Minutes per session: Not on file  . Stress: Not on file  Relationships  . Social connections:    Talks on phone: Not on file    Gets together: Not on file    Attends religious service: Not on file    Active member of club or organization: Not on file    Attends meetings of clubs or organizations: Not on file    Relationship status: Not on file  Other Topics Concern  . Not on file  Social History Narrative   Lives in [place] with her husband.    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    Patient has 3 grown children that do not live with her.    Patient does not have any medical insurance.   Review of Systems - See HPI.  All other ROS are negative.  BP 138/88   Pulse 65   Temp 98.4 F (36.9 C) (Oral)   Resp 18   Wt 144 lb 6.4 oz (65.5 kg)   SpO2 99%   BMI 27.28 kg/m   Physical Exam Vitals signs reviewed.  Constitutional:      Appearance: Normal appearance.  HENT:     Head: Normocephalic and atraumatic.     Right Ear: Tympanic membrane normal.     Left Ear: Tympanic membrane normal.     Nose: Nose normal.  Eyes:     Conjunctiva/sclera: Conjunctivae normal.  Neck:     Musculoskeletal: Normal range of motion and neck  supple.  Cardiovascular:     Rate and Rhythm: Normal rate and regular rhythm.     Pulses: Normal pulses.  Pulmonary:     Effort: Pulmonary effort is normal.     Breath sounds: Wheezing present.  Neurological:     Mental Status: She is alert and oriented to person, place, and time.     Recent Results (from  the past 2160 hour(s))  Basic metabolic panel     Status: Abnormal   Collection Time: 09/28/18  6:02 PM  Result Value Ref Range   Sodium 142 135 - 145 mmol/L   Potassium 4.3 3.5 - 5.1 mmol/L   Chloride 107 98 - 111 mmol/L   CO2 24 22 - 32 mmol/L   Glucose, Bld 114 (H) 70 - 99 mg/dL   BUN 20 8 - 23 mg/dL   Creatinine, Ser 1.26 (H) 0.44 - 1.00 mg/dL   Calcium 9.3 8.9 - 10.3 mg/dL   GFR calc non Af Amer 43 (L) >60 mL/min   GFR calc Af Amer 50 (L) >60 mL/min   Anion gap 11 5 - 15    Comment: Performed at [facility], [place] [address], [place], [place] [address]  CBC     Status: None   Collection Time: 09/28/18  6:02 PM  Result Value Ref Range   WBC 5.6 4.0 - 10.5 K/uL   RBC 4.62 3.87 - 5.11 MIL/uL   Hemoglobin 13.2 12.0 - 15.0 g/dL   HCT 42.1 36.0 - 46.0 %   MCV 91.1 80.0 - 100.0 fL   MCH 28.6 26.0 - 34.0 pg   MCHC 31.4 30.0 - 36.0 g/dL   RDW 12.4 11.5 - 15.5 %   Platelets 267 150 - 400 K/uL   nRBC 0.0 0.0 - 0.2 %    Comment: Performed at [facility], [place] [address] [place], [place] [address]  I-stat troponin, ED (0, 3)     Status: None   Collection Time: 09/28/18  6:25 PM  Result Value Ref Range   Troponin i, poc 0.00 0.00 - 0.08 ng/mL   Comment 3            Comment: Due to the release kinetics of cTnI, a negative result within the first hours of the onset of symptoms does not rule out myocardial infarction with certainty. If myocardial infarction is still suspected, repeat the test at appropriate intervals.   POCT i-Stat troponin I     Status: None   Collection Time: 09/28/18 10:38 PM  Result Value Ref Range   Troponin i,  poc 0.00 0.00 - 0.08 ng/mL   Comment 3            Comment: Due to the release kinetics of cTnI, a negative result within the first hours of the onset of symptoms does not rule out myocardial infarction with certainty. If myocardial infarction is still suspected, repeat the test at appropriate intervals.   Protime-INR     Status: None   Collection Time: 09/29/18  2:29 AM  Result Value Ref Range   Prothrombin Time 12.0 11.4 - 15.2 seconds   INR 0.89     Comment: Performed at [facility], [place] [address], [place], [place] [address]  Troponin I - Now Then Q6H     Status: None   Collection Time: 09/29/18  2:29 AM  Result Value Ref Range   Troponin I <0.03 <0.03 ng/mL    Comment: Performed at [facility], [place] [address] [place], [place] [address]  HIV antibody (Routine Testing)     Status: None   Collection Time: 09/29/18  2:29 AM  Result Value Ref Range   HIV Screen 4th Generation wRfx Non Reactive Non Reactive    Comment: (NOTE) Performed At: [facility] [address] [place], [place] [id number] Rush Farmer MD IH:[id number]   Hemoglobin  A1c     Status: None   Collection Time: 09/29/18  2:30 AM  Result Value Ref Range   Hgb A1c MFr Bld 5.1 4.8 - 5.6 %    Comment: (NOTE) Pre diabetes:          5.7%-6.4% Diabetes:              >6.4% Glycemic control for   <7.0% adults with diabetes    Mean Plasma Glucose 99.67 mg/dL    Comment: Performed at [facility] [address] [place], [place] [address]  Lipid panel     Status: Abnormal   Collection Time: 09/29/18  2:30 AM  Result Value Ref Range   Cholesterol 223 (H) 0 - 200 mg/dL   Triglycerides 271 (H) <150 mg/dL   HDL 54 >40 mg/dL   Total CHOL/HDL Ratio 4.1 RATIO   VLDL 54 (H) 0 - 40 mg/dL   LDL Cholesterol 115 (H) 0 - 99 mg/dL    Comment:        Total Cholesterol/HDL:CHD Risk Coronary Heart Disease Risk Table                     Men   Women  1/2 Average Risk   3.4    3.3  Average Risk       5.0   4.4  2 X Average Risk   9.6   7.1  3 X Average Risk  23.4   11.0        Use the calculated Patient Ratio above and the CHD Risk Table to determine the patient's CHD Risk.        ATP III CLASSIFICATION (LDL):  <100     mg/dL   Optimal  100-129  mg/dL   Near or Above                    Optimal  130-159  mg/dL   Borderline  160-189  mg/dL   High  >190     mg/dL   Very High Performed at [facility] [address] [place], [place] [address]   Urine rapid drug screen (hosp performed)     Status: Abnormal   Collection Time: 09/29/18  2:30 AM  Result Value Ref Range   Opiates NONE DETECTED NONE DETECTED   Cocaine POSITIVE (A) NONE DETECTED   Benzodiazepines NONE DETECTED NONE DETECTED   Amphetamines NONE DETECTED NONE DETECTED   Tetrahydrocannabinol NONE DETECTED NONE DETECTED   Barbiturates NONE DETECTED NONE DETECTED    Comment: (NOTE) DRUG SCREEN FOR MEDICAL PURPOSES ONLY.  IF CONFIRMATION IS NEEDED FOR ANY PURPOSE, NOTIFY LAB WITHIN 5 DAYS. LOWEST DETECTABLE LIMITS FOR URINE DRUG SCREEN Drug Class                     Cutoff (ng/mL) Amphetamine and metabolites    1000 Barbiturate and metabolites    200 Benzodiazepine                 509 Tricyclics and metabolites     300 Opiates and metabolites        300 Cocaine and metabolites        300 THC                            50 Performed at [facility], [place] [address], [place], [place] [address]   Troponin I -  Now Then Q6H     Status: None   Collection Time: 09/29/18  1:37 PM  Result Value Ref Range   Troponin I <0.03 <0.03 ng/mL    Comment: Performed at [facility], [place] [address], [place], [place] [address]  Basic metabolic panel     Status: Abnormal   Collection Time: 10/04/18  2:02 PM  Result Value Ref Range   Sodium 139 135 - 145 mEq/L   Potassium 3.9 3.5 - 5.1 mEq/L   Chloride 105 96 - 112 mEq/L   CO2 27 19 - 32 mEq/L    Glucose, Bld 100 (H) 70 - 99 mg/dL   BUN 16 6 - 23 mg/dL   Creatinine, Ser 1.50 (H) 0.40 - 1.20 mg/dL   Calcium 9.4 8.4 - 10.5 mg/dL   GFR 36.41 (L) >60.00 mL/min   Assessment/Plan: 1. Bronchospasm Not compliant with medications. Continues to smoke despite medical risks that have been discussed - ipratropium-albuterol (DUONEB) 0.5-2.5 (3) MG/3ML nebulizer solution 3 mL  2. Encounter for immunization - Flu vaccine HIGH DOSE PF  3. Viral URI with cough Supportive measures reviewed. Start Mucinex. Restart Symbicort. Close follow-up discussed.  4. Primary insomnia Will attempt trial of Belsomra 10 mg. Sleep hygiene reviewed. Handout given.  5. Essential hypertension Repeat BMP today. Patient instructed to take her HCTZ as directed. Continue good hydration and DASH diet. Follow-up 2 weeks for repeat assessment.  - Basic metabolic panel  6. Tobacco abuse disorder Declines counseling for cessation AMA despite known health risks.    [name], PA-C

## 2018-10-05 ENCOUNTER — Other Ambulatory Visit: Payer: Self-pay | Admitting: Physician Assistant

## 2018-10-05 DIAGNOSIS — R7989 Other specified abnormal findings of blood chemistry: Secondary | ICD-10-CM

## 2018-10-11 ENCOUNTER — Other Ambulatory Visit: Payer: Self-pay

## 2018-10-27 ENCOUNTER — Telehealth: Payer: Self-pay | Admitting: Emergency Medicine

## 2018-10-27 NOTE — Telephone Encounter (Signed)
-----   Message from Brunetta Jeans, PA-C sent at 10/25/2018  2:15 PM EST ----- Please contact patient so she can have this done ----- Message ----- From: SYSTEM Sent: 10/23/2018  12:08 AM EST To: Brunetta Jeans, PA-C

## 2018-10-28 ENCOUNTER — Telehealth: Payer: Self-pay | Admitting: Emergency Medicine

## 2018-10-28 NOTE — Telephone Encounter (Signed)
error 

## 2018-10-28 NOTE — Telephone Encounter (Signed)
LMOVM advising patient is overdue for her repeat creatinine level. Please call back to schedule a lab visit to get completed. Orders already in chart.

## 2019-03-01 ENCOUNTER — Encounter: Payer: Medicare Other | Admitting: Physician Assistant

## 2019-03-01 ENCOUNTER — Ambulatory Visit (INDEPENDENT_AMBULATORY_CARE_PROVIDER_SITE_OTHER): Payer: Medicare Other | Admitting: Physician Assistant

## 2019-03-01 ENCOUNTER — Telehealth: Payer: Self-pay | Admitting: Physician Assistant

## 2019-03-01 ENCOUNTER — Other Ambulatory Visit: Payer: Self-pay

## 2019-03-01 ENCOUNTER — Encounter: Payer: Self-pay | Admitting: Physician Assistant

## 2019-03-01 DIAGNOSIS — J441 Chronic obstructive pulmonary disease with (acute) exacerbation: Secondary | ICD-10-CM | POA: Diagnosis not present

## 2019-03-01 MED ORDER — ALBUTEROL SULFATE (2.5 MG/3ML) 0.083% IN NEBU: 2.5000 mg | INHALATION_SOLUTION | Freq: Four times a day (QID) | RESPIRATORY_TRACT | 0 refills | Status: DC | PRN

## 2019-03-01 MED ORDER — BENZONATATE 100 MG PO CAPS: 100.0000 mg | ORAL_CAPSULE | Freq: Three times a day (TID) | ORAL | 0 refills | Status: DC | PRN

## 2019-03-01 MED ORDER — METOPROLOL TARTRATE 25 MG PO TABS: 25.0000 mg | ORAL_TABLET | Freq: Two times a day (BID) | ORAL | 0 refills | Status: DC

## 2019-03-01 MED ORDER — BUDESONIDE-FORMOTEROL FUMARATE 160-4.5 MCG/ACT IN AERO: 2.0000 | INHALATION_SPRAY | Freq: Two times a day (BID) | RESPIRATORY_TRACT | 1 refills | Status: DC

## 2019-03-01 MED ORDER — LOSARTAN POTASSIUM 100 MG PO TABS: 100.0000 mg | ORAL_TABLET | Freq: Every day | ORAL | 0 refills | Status: DC

## 2019-03-01 MED ORDER — DOXYCYCLINE HYCLATE 100 MG PO CAPS: 100.0000 mg | ORAL_CAPSULE | Freq: Two times a day (BID) | ORAL | 0 refills | Status: DC

## 2019-03-01 MED ORDER — HYDROCHLOROTHIAZIDE 12.5 MG PO TABS: 12.5000 mg | ORAL_TABLET | Freq: Every day | ORAL | 0 refills | Status: DC

## 2019-03-01 NOTE — Patient Instructions (Signed)
Instructions sent to MyChart.   Please keep hydrated. Take Mucinex twice daily to thin congestion. Restart your Symbicort using 2 puffs twice daily. It is very important that you take this as directed. Use the Albuterol up to every 6 hours as directed if needed for wheeze. I have refilled your Symbicort and have sent in a refill of the Albuterol for when you need it. Use the Tessalon as directed to help with cough. I have sent in an antibiotic for you to take as well.  If not improving we will need CXR and/or assessment in the office. ER for any acutely worsening symptoms.

## 2019-03-01 NOTE — Progress Notes (Signed)
I have discussed the procedure for the virtual visit with the patient who has given consent to proceed with assessment and treatment.   Ilaria Much S Jaymes Hang, CMA     

## 2019-03-01 NOTE — Progress Notes (Signed)
Virtual Visit via Video   I connected with patient on 03/01/19 at  2:30 PM EDT by a video enabled telemedicine application and verified that I am speaking with the correct person using two identifiers.  Location patient: Home Location provider: [facility], Office Persons participating in the virtual visit: Patient, Provider, [place] (Patina Moore)  I discussed the limitations of evaluation and management by telemedicine and the availability of in person appointments. The patient expressed understanding and agreed to proceed.  Subjective:   HPI:    Patient presents via [facility] today c/o 2 weeks of cough with chest tightness and wheezing. Patient with history of asthma, COPD, CKD and tobacco use (still smoking), currently on a regimen of Symbicort BID and Albuterol PRN. Notes she has not been taking either medication. Notes some chest congestion with thick sputum but mostly white. Denies fever, chills. Denies recent travel or sick contact. Denies sinus symptoms. Is trying to hydrate. .    ROS:   See pertinent positives and negatives per HPI.  Patient Active Problem List   Diagnosis Date Noted  . CKD (chronic kidney disease) stage 3, GFR 30-59 ml/min (HCC) 09/29/2018  . Urinary urgency 06/11/2018  . Visit for preventive health examination 04/30/2017  . Osteoporosis 12/31/2016  . Arthralgia of both hands 11/11/2016  . Breast cancer screening 11/11/2016  . Chest pain 04/17/2016  . Hyperglycemia 08/03/2015  . Esophageal reflux   . Depression with anxiety   . Tobacco abuse disorder 06/27/2014  . Insomnia 07/15/2013  . Mitral regurgitation due to cusp prolapse 09/29/2011  . COPD GOLD O  09/26/2011  . Essential hypertension 09/26/2011  . HLD (hyperlipidemia) 09/26/2011  . S/P cholecystectomy 09/26/2011    Social History   Tobacco Use  . Smoking status: Current Every Day Smoker    Packs/day: 0.50    Years: 53.00    Pack years: 26.50    Types: Cigarettes  . Smokeless  tobacco: Never Used  Substance Use Topics  . Alcohol use: Yes    Alcohol/week: 0.0 standard drinks    Comment:  "drink very rarely"    Current Outpatient Medications:  .  albuterol (PROVENTIL) (2.5 MG/3ML) 0.083% nebulizer solution, Take 3 mLs (2.5 mg total) by nebulization every 6 (six) hours as needed for wheezing or shortness of breath., Disp: 150 mL, Rfl: 0 .  aspirin EC 81 MG tablet, Take 1 tablet (81 mg total) by mouth daily., Disp: 30 tablet, Rfl: 0 .  atorvastatin (LIPITOR) 40 MG tablet, Take 1 tablet (40 mg total) by mouth daily at 6 PM., Disp: 90 tablet, Rfl: 1 .  budesonide-formoterol (SYMBICORT) 160-4.5 MCG/ACT inhaler, Inhale 2 puffs into the lungs 2 (two) times daily., Disp: 3 Inhaler, Rfl: 1 .  fenofibrate 160 MG tablet, Take 1 tablet (160 mg total) by mouth daily., Disp: 90 tablet, Rfl: 1 .  hydrochlorothiazide (HYDRODIURIL) 12.5 MG tablet, Take 1 tablet (12.5 mg total) by mouth daily., Disp: 30 tablet, Rfl: 0 .  losartan (COZAAR) 100 MG tablet, Take 1 tablet (100 mg total) by mouth daily., Disp: 30 tablet, Rfl: 0 .  metoprolol tartrate (LOPRESSOR) 25 MG tablet, Take 1 tablet (25 mg total) by mouth 2 (two) times daily., Disp: 60 tablet, Rfl: 0 .  Suvorexant (BELSOMRA) 10 MG TABS, Take 10 mg by mouth at bedtime., Disp: 10 tablet, Rfl: 0 .  diphenhydrAMINE (NIGHTTIME SLEEP AID) 25 MG tablet, Take 50 mg by mouth at bedtime as needed for sleep., Disp: , Rfl:   Allergies  Allergen Reactions  . Ace Inhibitors     Pseudoasthma/ renal failure    Objective:   There were no vitals taken for this visit.  Patient is well-developed, well-nourished in no acute distress.  Resting comfortably in chair at home.  Head is normocephalic, atraumatic.  No labored breathing on inspection. Speech is clear and coherent with logical content.  Patient is alert and oriented at baseline.   Assessment and Plan:   1. COPD with exacerbation (HCC) Mild. Not taking any of her maintenance or  rescue medication. Meds refilled. Restart Symbicort BID. Stressed importance of using this medication. Albuterol every 6 hours PRN. Start Mucinex BID. Increase fluids. Rx Doxycycline in case of developing infection with COPD exacerbation. Rx Tessalon for cough. Close follow-up discussed with patient. ER precautions reviewed.   - albuterol (PROVENTIL) (2.5 MG/3ML) 0.083% nebulizer solution; Take 3 mLs (2.5 mg total) by nebulization every 6 (six) hours as needed for wheezing or shortness of breath.  Dispense: 150 mL; Refill: 0    [name], [place] 03/01/2019

## 2019-03-01 NOTE — Telephone Encounter (Signed)
Copied from [place]. Topic: General - Other >> Mar 01, 2019  2:02 PM Katie [name] wrote: Reason for CRM: Patient called to say that she is very sorry but she fell asleep and slept for 3 hours that is why she missed the call from Dr Hassell Done. She is asking for [name] call back when Dr Hassell Done have some free time please. Ph# [phone]

## 2019-03-01 NOTE — Telephone Encounter (Signed)
Called patient back and rescheduled her appt to later today

## 2019-03-02 NOTE — Progress Notes (Signed)
This encounter was created in error - please disregard.

## 2019-03-04 ENCOUNTER — Other Ambulatory Visit: Payer: Self-pay | Admitting: Emergency Medicine

## 2019-03-04 DIAGNOSIS — I10 Essential (primary) hypertension: Secondary | ICD-10-CM

## 2019-03-04 MED ORDER — LOSARTAN POTASSIUM 100 MG PO TABS: 100.0000 mg | ORAL_TABLET | Freq: Every day | ORAL | 0 refills | Status: DC

## 2019-03-06 ENCOUNTER — Emergency Department (HOSPITAL_COMMUNITY): Payer: Medicare Other

## 2019-03-06 ENCOUNTER — Inpatient Hospital Stay (HOSPITAL_COMMUNITY)
Admission: EM | Admit: 2019-03-06 | Discharge: 2019-03-10 | DRG: 552 | Disposition: A | Discharge status: Home Health | Payer: Medicare Other | Attending: General Surgery | Admitting: General Surgery

## 2019-03-06 ENCOUNTER — Encounter (HOSPITAL_COMMUNITY): Payer: Self-pay

## 2019-03-06 ENCOUNTER — Other Ambulatory Visit: Payer: Self-pay

## 2019-03-06 DIAGNOSIS — S8991XA Unspecified injury of right lower leg, initial encounter: Secondary | ICD-10-CM | POA: Diagnosis not present

## 2019-03-06 DIAGNOSIS — S32039A Unspecified fracture of third lumbar vertebra, initial encounter for closed fracture: Secondary | ICD-10-CM | POA: Diagnosis not present

## 2019-03-06 DIAGNOSIS — W19XXXA Unspecified fall, initial encounter: Secondary | ICD-10-CM | POA: Diagnosis not present

## 2019-03-06 DIAGNOSIS — Z79899 Other long term (current) drug therapy: Secondary | ICD-10-CM

## 2019-03-06 DIAGNOSIS — R000 Tachycardia, unspecified: Secondary | ICD-10-CM | POA: Diagnosis not present

## 2019-03-06 DIAGNOSIS — M542 Cervicalgia: Secondary | ICD-10-CM | POA: Diagnosis not present

## 2019-03-06 DIAGNOSIS — J449 Chronic obstructive pulmonary disease, unspecified: Secondary | ICD-10-CM | POA: Diagnosis not present

## 2019-03-06 DIAGNOSIS — S3993XA Unspecified injury of pelvis, initial encounter: Secondary | ICD-10-CM | POA: Diagnosis not present

## 2019-03-06 DIAGNOSIS — R7989 Other specified abnormal findings of blood chemistry: Secondary | ICD-10-CM | POA: Diagnosis not present

## 2019-03-06 DIAGNOSIS — R0602 Shortness of breath: Secondary | ICD-10-CM

## 2019-03-06 DIAGNOSIS — M25461 Effusion, right knee: Secondary | ICD-10-CM | POA: Diagnosis present

## 2019-03-06 DIAGNOSIS — E785 Hyperlipidemia, unspecified: Secondary | ICD-10-CM | POA: Diagnosis not present

## 2019-03-06 DIAGNOSIS — Z79890 Hormone replacement therapy (postmenopausal): Secondary | ICD-10-CM

## 2019-03-06 DIAGNOSIS — M545 Low back pain, unspecified: Secondary | ICD-10-CM

## 2019-03-06 DIAGNOSIS — I719 Aortic aneurysm of unspecified site, without rupture: Secondary | ICD-10-CM | POA: Diagnosis not present

## 2019-03-06 DIAGNOSIS — M5489 Other dorsalgia: Secondary | ICD-10-CM | POA: Diagnosis not present

## 2019-03-06 DIAGNOSIS — S3991XA Unspecified injury of abdomen, initial encounter: Secondary | ICD-10-CM | POA: Diagnosis not present

## 2019-03-06 DIAGNOSIS — E860 Dehydration: Secondary | ICD-10-CM | POA: Diagnosis present

## 2019-03-06 DIAGNOSIS — S92351A Displaced fracture of fifth metatarsal bone, right foot, initial encounter for closed fracture: Secondary | ICD-10-CM | POA: Diagnosis not present

## 2019-03-06 DIAGNOSIS — Z1159 Encounter for screening for other viral diseases: Secondary | ICD-10-CM | POA: Diagnosis not present

## 2019-03-06 DIAGNOSIS — I10 Essential (primary) hypertension: Secondary | ICD-10-CM | POA: Diagnosis present

## 2019-03-06 DIAGNOSIS — S80211A Abrasion, right knee, initial encounter: Secondary | ICD-10-CM | POA: Diagnosis not present

## 2019-03-06 DIAGNOSIS — Y92410 Unspecified street and highway as the place of occurrence of the external cause: Secondary | ICD-10-CM

## 2019-03-06 DIAGNOSIS — S92354A Nondisplaced fracture of fifth metatarsal bone, right foot, initial encounter for closed fracture: Secondary | ICD-10-CM | POA: Diagnosis not present

## 2019-03-06 DIAGNOSIS — S0990XA Unspecified injury of head, initial encounter: Secondary | ICD-10-CM | POA: Diagnosis not present

## 2019-03-06 DIAGNOSIS — R21 Rash and other nonspecific skin eruption: Secondary | ICD-10-CM | POA: Diagnosis not present

## 2019-03-06 DIAGNOSIS — S92309A Fracture of unspecified metatarsal bone(s), unspecified foot, initial encounter for closed fracture: Secondary | ICD-10-CM | POA: Diagnosis present

## 2019-03-06 DIAGNOSIS — R52 Pain, unspecified: Secondary | ICD-10-CM

## 2019-03-06 DIAGNOSIS — S299XXA Unspecified injury of thorax, initial encounter: Secondary | ICD-10-CM | POA: Diagnosis not present

## 2019-03-06 DIAGNOSIS — M79661 Pain in right lower leg: Secondary | ICD-10-CM | POA: Diagnosis not present

## 2019-03-06 DIAGNOSIS — S32038A Other fracture of third lumbar vertebra, initial encounter for closed fracture: Secondary | ICD-10-CM | POA: Diagnosis not present

## 2019-03-06 DIAGNOSIS — S199XXA Unspecified injury of neck, initial encounter: Secondary | ICD-10-CM | POA: Diagnosis not present

## 2019-03-06 LAB — CBC
HCT: 38.4 % (ref 36.0–46.0)
Hemoglobin: 12.4 g/dL (ref 12.0–15.0)
MCH: 29.8 pg (ref 26.0–34.0)
MCHC: 32.3 g/dL (ref 30.0–36.0)
MCV: 92.3 fL (ref 80.0–100.0)
Platelets: 316 10*3/uL (ref 150–400)
RBC: 4.16 MIL/uL (ref 3.87–5.11)
RDW: 12.7 % (ref 11.5–15.5)
WBC: 12.3 10*3/uL — ABNORMAL HIGH (ref 4.0–10.5)
nRBC: 0 % (ref 0.0–0.2)

## 2019-03-06 LAB — COMPREHENSIVE METABOLIC PANEL
ALT: 15 U/L (ref 0–44)
AST: 21 U/L (ref 15–41)
Albumin: 3.4 g/dL — ABNORMAL LOW (ref 3.5–5.0)
Alkaline Phosphatase: 81 U/L (ref 38–126)
Anion gap: 10 (ref 5–15)
BUN: 17 mg/dL (ref 8–23)
CO2: 23 mmol/L (ref 22–32)
Calcium: 9.4 mg/dL (ref 8.9–10.3)
Chloride: 106 mmol/L (ref 98–111)
Creatinine, Ser: 1.6 mg/dL — ABNORMAL HIGH (ref 0.44–1.00)
GFR calc Af Amer: 37 mL/min — ABNORMAL LOW (ref >60)
GFR calc non Af Amer: 32 mL/min — ABNORMAL LOW (ref >60)
Glucose, Bld: 114 mg/dL — ABNORMAL HIGH (ref 70–99)
Potassium: 4 mmol/L (ref 3.5–5.1)
Sodium: 139 mmol/L (ref 135–145)
Total Bilirubin: 0.5 mg/dL (ref 0.3–1.2)
Total Protein: 6.6 g/dL (ref 6.5–8.1)

## 2019-03-06 LAB — ETHANOL: Alcohol, Ethyl (B): <10 mg/dL (ref <10)

## 2019-03-06 LAB — CDS SEROLOGY

## 2019-03-06 LAB — PROTIME-INR
INR: 1 (ref 0.8–1.2)
Prothrombin Time: 12.9 seconds (ref 11.4–15.2)

## 2019-03-06 LAB — SAMPLE TO BLOOD BANK

## 2019-03-06 LAB — LACTIC ACID, PLASMA: Lactic Acid, Venous: 1.8 mmol/L (ref 0.5–1.9)

## 2019-03-06 MED ORDER — SODIUM CHLORIDE 0.9 % IV BOLUS
500.0000 mL | Freq: Once | INTRAVENOUS | Status: AC
  Administered 2019-03-06: 500 mL via INTRAVENOUS

## 2019-03-06 MED ORDER — IOHEXOL 300 MG/ML  SOLN
80.0000 mL | Freq: Once | INTRAMUSCULAR | Status: AC | PRN
  Administered 2019-03-06: 80 mL via INTRAVENOUS

## 2019-03-06 MED ORDER — HYDROCODONE-ACETAMINOPHEN 5-325 MG PO TABS
1.0000 | ORAL_TABLET | Freq: Once | ORAL | Status: AC
  Administered 2019-03-06: 1 via ORAL
  Filled 2019-03-06: qty 1

## 2019-03-06 MED ORDER — FENTANYL CITRATE (PF) 100 MCG/2ML IJ SOLN
INTRAMUSCULAR | Status: AC | PRN
  Administered 2019-03-06: 50 ug via INTRAVENOUS

## 2019-03-06 MED ORDER — ONDANSETRON HCL 4 MG/2ML IJ SOLN
INTRAMUSCULAR | Status: AC
  Administered 2019-03-06: 18:00:00
  Filled 2019-03-06: qty 2

## 2019-03-06 MED ORDER — FENTANYL CITRATE (PF) 100 MCG/2ML IJ SOLN
INTRAMUSCULAR | Status: AC
  Filled 2019-03-06: qty 2

## 2019-03-06 NOTE — ED Notes (Signed)
Messaged EDP about ortho tech concerns for pain and the need for a walker.

## 2019-03-06 NOTE — ED Notes (Signed)
Called pt's dtr Reita Cliche [phone])  to update her on the change of plans to admit her mom.

## 2019-03-06 NOTE — Progress Notes (Signed)
Orthopedic Tech Progress Note Patient Details:  Katie [name] May 30, 1946 [id number]  Ortho Devices Ortho Device/Splint Location: Level 2 Trauma       [name] 03/06/2019, 6:16 PM

## 2019-03-06 NOTE — ED Notes (Signed)
Ortho paged. 

## 2019-03-06 NOTE — Progress Notes (Signed)
Orthopedic Tech Progress Note Patient Details:  Calleigh Lafontant Aug 13, 1946 [id number]  Ortho Devices Type of Ortho Device: Crutches, CAM walker Ortho Device/Splint Location: rle. pt had to much pain to get up and walk with the crutches. i informed the RN.  Ortho Device/Splint Interventions: Ordered, Application, Adjustment   Post Interventions Patient Tolerated: Poor Instructions Provided: Care of device, Adjustment of device, Poper ambulation with device   Karolee Stamps 03/06/2019, 9:53 PM
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
## 2019-03-06 NOTE — Progress Notes (Signed)
Orthopedic Tech Progress Note Patient Details:  Katie [name] 03-Sep-1946 [id number]  Ortho Devices Type of Ortho Device: CAM walker Ortho Device/Splint Location: rle. pt had to much pain to get up and walk with the crutches. i informed the RN. i was asked tyo retrieve the crutches after it was decided to admit the pt. Ortho Device/Splint Interventions: Ordered, Application, Adjustment   Post Interventions Patient Tolerated: Well Instructions Provided: Care of device, Adjustment of device, Poper ambulation with device   Karolee Stamps 03/06/2019, 11:12 PM

## 2019-03-06 NOTE — ED Notes (Signed)
Removed collar per EDP.

## 2019-03-06 NOTE — ED Notes (Signed)
Patient transported to CT 
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
## 2019-03-07 ENCOUNTER — Inpatient Hospital Stay (HOSPITAL_COMMUNITY): Payer: Medicare Other

## 2019-03-07 ENCOUNTER — Encounter (HOSPITAL_COMMUNITY): Payer: Self-pay | Admitting: *Deleted

## 2019-03-07 DIAGNOSIS — M25461 Effusion, right knee: Secondary | ICD-10-CM | POA: Diagnosis not present

## 2019-03-07 DIAGNOSIS — S92351A Displaced fracture of fifth metatarsal bone, right foot, initial encounter for closed fracture: Secondary | ICD-10-CM | POA: Diagnosis not present

## 2019-03-07 DIAGNOSIS — S8001XA Contusion of right knee, initial encounter: Secondary | ICD-10-CM | POA: Diagnosis not present

## 2019-03-07 DIAGNOSIS — I10 Essential (primary) hypertension: Secondary | ICD-10-CM | POA: Diagnosis present

## 2019-03-07 DIAGNOSIS — M25561 Pain in right knee: Secondary | ICD-10-CM | POA: Diagnosis not present

## 2019-03-07 DIAGNOSIS — Z79890 Hormone replacement therapy (postmenopausal): Secondary | ICD-10-CM | POA: Diagnosis not present

## 2019-03-07 DIAGNOSIS — I719 Aortic aneurysm of unspecified site, without rupture: Secondary | ICD-10-CM | POA: Diagnosis present

## 2019-03-07 DIAGNOSIS — S92309A Fracture of unspecified metatarsal bone(s), unspecified foot, initial encounter for closed fracture: Secondary | ICD-10-CM | POA: Diagnosis present

## 2019-03-07 DIAGNOSIS — S80211A Abrasion, right knee, initial encounter: Secondary | ICD-10-CM | POA: Diagnosis present

## 2019-03-07 DIAGNOSIS — J449 Chronic obstructive pulmonary disease, unspecified: Secondary | ICD-10-CM | POA: Diagnosis not present

## 2019-03-07 DIAGNOSIS — E860 Dehydration: Secondary | ICD-10-CM | POA: Diagnosis present

## 2019-03-07 DIAGNOSIS — Z1159 Encounter for screening for other viral diseases: Secondary | ICD-10-CM | POA: Diagnosis not present

## 2019-03-07 DIAGNOSIS — M542 Cervicalgia: Secondary | ICD-10-CM | POA: Diagnosis present

## 2019-03-07 DIAGNOSIS — R0602 Shortness of breath: Secondary | ICD-10-CM | POA: Diagnosis not present

## 2019-03-07 DIAGNOSIS — Z79899 Other long term (current) drug therapy: Secondary | ICD-10-CM | POA: Diagnosis not present

## 2019-03-07 DIAGNOSIS — Y92410 Unspecified street and highway as the place of occurrence of the external cause: Secondary | ICD-10-CM | POA: Diagnosis not present

## 2019-03-07 DIAGNOSIS — M545 Low back pain: Secondary | ICD-10-CM | POA: Diagnosis present

## 2019-03-07 DIAGNOSIS — E785 Hyperlipidemia, unspecified: Secondary | ICD-10-CM | POA: Diagnosis present

## 2019-03-07 DIAGNOSIS — S76111A Strain of right quadriceps muscle, fascia and tendon, initial encounter: Secondary | ICD-10-CM | POA: Diagnosis not present

## 2019-03-07 DIAGNOSIS — R21 Rash and other nonspecific skin eruption: Secondary | ICD-10-CM | POA: Diagnosis present

## 2019-03-07 DIAGNOSIS — S32039A Unspecified fracture of third lumbar vertebra, initial encounter for closed fracture: Secondary | ICD-10-CM | POA: Diagnosis not present

## 2019-03-07 DIAGNOSIS — I959 Hypotension, unspecified: Secondary | ICD-10-CM | POA: Diagnosis not present

## 2019-03-07 DIAGNOSIS — R7989 Other specified abnormal findings of blood chemistry: Secondary | ICD-10-CM | POA: Diagnosis present

## 2019-03-07 LAB — BASIC METABOLIC PANEL
Anion gap: 8 (ref 5–15)
BUN: 15 mg/dL (ref 8–23)
CO2: 23 mmol/L (ref 22–32)
Calcium: 8.6 mg/dL — ABNORMAL LOW (ref 8.9–10.3)
Chloride: 107 mmol/L (ref 98–111)
Creatinine, Ser: 1.39 mg/dL — ABNORMAL HIGH (ref 0.44–1.00)
GFR calc Af Amer: 44 mL/min — ABNORMAL LOW (ref >60)
GFR calc non Af Amer: 38 mL/min — ABNORMAL LOW (ref >60)
Glucose, Bld: 92 mg/dL (ref 70–99)
Potassium: 4.1 mmol/L (ref 3.5–5.1)
Sodium: 138 mmol/L (ref 135–145)

## 2019-03-07 LAB — URINALYSIS, ROUTINE W REFLEX MICROSCOPIC
Bilirubin Urine: NEGATIVE
Glucose, UA: NEGATIVE mg/dL
Hgb urine dipstick: NEGATIVE
Ketones, ur: NEGATIVE mg/dL
Leukocytes,Ua: NEGATIVE
Nitrite: NEGATIVE
Protein, ur: NEGATIVE mg/dL
Specific Gravity, Urine: >1.046 — ABNORMAL HIGH (ref 1.005–1.030)
pH: 5 (ref 5.0–8.0)

## 2019-03-07 LAB — CBC
HCT: 32.4 % — ABNORMAL LOW (ref 36.0–46.0)
Hemoglobin: 10.4 g/dL — ABNORMAL LOW (ref 12.0–15.0)
MCH: 29.5 pg (ref 26.0–34.0)
MCHC: 32.1 g/dL (ref 30.0–36.0)
MCV: 92 fL (ref 80.0–100.0)
Platelets: 200 10*3/uL (ref 150–400)
RBC: 3.52 MIL/uL — ABNORMAL LOW (ref 3.87–5.11)
RDW: 13 % (ref 11.5–15.5)
WBC: 5.8 10*3/uL (ref 4.0–10.5)
nRBC: 0 % (ref 0.0–0.2)

## 2019-03-07 LAB — SARS CORONAVIRUS 2 BY RT PCR (HOSPITAL ORDER, PERFORMED IN ~~LOC~~ HOSPITAL LAB): SARS Coronavirus 2: NEGATIVE

## 2019-03-07 MED ORDER — ACETAMINOPHEN 325 MG PO TABS
650.0000 mg | ORAL_TABLET | Freq: Four times a day (QID) | ORAL | Status: DC
  Administered 2019-03-07 – 2019-03-10 (×9): 650 mg via ORAL
  Filled 2019-03-07 (×9): qty 2

## 2019-03-07 MED ORDER — DOCUSATE SODIUM 100 MG PO CAPS
100.0000 mg | ORAL_CAPSULE | Freq: Two times a day (BID) | ORAL | Status: DC
  Administered 2019-03-07 – 2019-03-08 (×3): 100 mg via ORAL
  Filled 2019-03-07 (×7): qty 1

## 2019-03-07 MED ORDER — ENOXAPARIN SODIUM 40 MG/0.4ML ~~LOC~~ SOLN
40.0000 mg | SUBCUTANEOUS | Status: DC
  Administered 2019-03-08 – 2019-03-10 (×3): 40 mg via SUBCUTANEOUS
  Filled 2019-03-07 (×3): qty 0.4

## 2019-03-07 MED ORDER — MORPHINE SULFATE (PF) 2 MG/ML IV SOLN
1.0000 mg | INTRAVENOUS | Status: DC | PRN
  Administered 2019-03-07: 2 mg via INTRAVENOUS

## 2019-03-07 MED ORDER — OXYCODONE HCL 5 MG PO TABS
5.0000 mg | ORAL_TABLET | ORAL | Status: DC | PRN
  Administered 2019-03-07: 5 mg via ORAL
  Filled 2019-03-07 (×2): qty 2
  Filled 2019-03-07: qty 1

## 2019-03-07 MED ORDER — SODIUM CHLORIDE 0.9 % IV BOLUS
500.0000 mL | Freq: Once | INTRAVENOUS | Status: AC
  Administered 2019-03-07: 500 mL via INTRAVENOUS

## 2019-03-07 MED ORDER — DIPHENHYDRAMINE HCL 25 MG PO CAPS: 25.0000 mg | ORAL_CAPSULE | Freq: Four times a day (QID) | ORAL | Status: DC | PRN

## 2019-03-07 MED ORDER — FENOFIBRATE 160 MG PO TABS
160.0000 mg | ORAL_TABLET | Freq: Every day | ORAL | Status: DC
  Administered 2019-03-07: 160 mg via ORAL
  Filled 2019-03-07 (×4): qty 1

## 2019-03-07 MED ORDER — LOSARTAN POTASSIUM 50 MG PO TABS
100.0000 mg | ORAL_TABLET | Freq: Every day | ORAL | Status: DC
  Administered 2019-03-07: 100 mg via ORAL
  Filled 2019-03-07: qty 2

## 2019-03-07 MED ORDER — HYDROCHLOROTHIAZIDE 25 MG PO TABS
12.5000 mg | ORAL_TABLET | Freq: Every day | ORAL | Status: DC
  Administered 2019-03-07: 12.5 mg via ORAL
  Filled 2019-03-07: qty 1

## 2019-03-07 MED ORDER — ENOXAPARIN SODIUM 30 MG/0.3ML ~~LOC~~ SOLN
30.0000 mg | SUBCUTANEOUS | Status: DC
  Administered 2019-03-07: 30 mg via SUBCUTANEOUS
  Filled 2019-03-07: qty 0.3

## 2019-03-07 MED ORDER — ACETAMINOPHEN 325 MG PO TABS
650.0000 mg | ORAL_TABLET | Freq: Four times a day (QID) | ORAL | Status: DC
  Administered 2019-03-07: 650 mg via ORAL
  Filled 2019-03-07 (×2): qty 2

## 2019-03-07 MED ORDER — SUVOREXANT 10 MG PO TABS: 10.0000 mg | ORAL_TABLET | Freq: Every evening | ORAL | Status: DC | PRN

## 2019-03-07 MED ORDER — DOXYLAMINE SUCCINATE (SLEEP) 25 MG PO TABS
25.0000 mg | ORAL_TABLET | Freq: Every evening | ORAL | Status: DC | PRN
  Administered 2019-03-08: 25 mg via ORAL
  Filled 2019-03-07 (×2): qty 1

## 2019-03-07 MED ORDER — ONDANSETRON 4 MG PO TBDP: 4.0000 mg | ORAL_TABLET | Freq: Four times a day (QID) | ORAL | Status: DC | PRN

## 2019-03-07 MED ORDER — METOPROLOL TARTRATE 25 MG PO TABS
25.0000 mg | ORAL_TABLET | Freq: Two times a day (BID) | ORAL | Status: DC
  Administered 2019-03-07 – 2019-03-10 (×4): 25 mg via ORAL
  Filled 2019-03-07 (×6): qty 1

## 2019-03-07 MED ORDER — ONDANSETRON HCL 4 MG/2ML IJ SOLN
4.0000 mg | Freq: Four times a day (QID) | INTRAMUSCULAR | Status: DC | PRN
  Administered 2019-03-07 (×2): 4 mg via INTRAVENOUS
  Filled 2019-03-07 (×3): qty 2

## 2019-03-07 MED ORDER — GUAIFENESIN ER 600 MG PO TB12: 600.0000 mg | ORAL_TABLET | ORAL | Status: DC | PRN

## 2019-03-07 MED ORDER — GUAIFENESIN ER 600 MG PO TB12
600.0000 mg | ORAL_TABLET | Freq: Two times a day (BID) | ORAL | Status: DC
  Administered 2019-03-07 – 2019-03-08 (×3): 600 mg via ORAL
  Filled 2019-03-07 (×6): qty 1

## 2019-03-07 MED ORDER — METHOCARBAMOL 750 MG PO TABS
750.0000 mg | ORAL_TABLET | Freq: Three times a day (TID) | ORAL | Status: DC | PRN
  Administered 2019-03-07: 750 mg via ORAL
  Filled 2019-03-07: qty 1

## 2019-03-07 MED ORDER — POTASSIUM CHLORIDE IN NACL 20-0.9 MEQ/L-% IV SOLN
INTRAVENOUS | Status: DC
  Administered 2019-03-07 – 2019-03-08 (×3): via INTRAVENOUS
  Filled 2019-03-07 (×3): qty 1000

## 2019-03-07 MED ORDER — METHOCARBAMOL 750 MG PO TABS
750.0000 mg | ORAL_TABLET | Freq: Three times a day (TID) | ORAL | Status: DC
  Administered 2019-03-07 – 2019-03-08 (×6): 750 mg via ORAL
  Filled 2019-03-07 (×8): qty 1

## 2019-03-07 MED ORDER — MORPHINE SULFATE (PF) 2 MG/ML IV SOLN
1.0000 mg | INTRAVENOUS | Status: DC | PRN
  Administered 2019-03-07: 2 mg via INTRAVENOUS
  Filled 2019-03-07 (×2): qty 1

## 2019-03-07 MED ORDER — POLYETHYLENE GLYCOL 3350 17 G PO PACK: 17.0000 g | PACK | Freq: Every day | ORAL | Status: DC | PRN

## 2019-03-07 MED ORDER — MOMETASONE FURO-FORMOTEROL FUM 200-5 MCG/ACT IN AERO
2.0000 | INHALATION_SPRAY | Freq: Two times a day (BID) | RESPIRATORY_TRACT | Status: DC
  Administered 2019-03-07 – 2019-03-10 (×7): 2 via RESPIRATORY_TRACT
  Filled 2019-03-07: qty 8.8

## 2019-03-07 MED ORDER — ATORVASTATIN CALCIUM 40 MG PO TABS
40.0000 mg | ORAL_TABLET | Freq: Every day | ORAL | Status: DC
  Administered 2019-03-07 – 2019-03-10 (×3): 40 mg via ORAL
  Filled 2019-03-07 (×4): qty 1

## 2019-03-07 MED ORDER — ALBUTEROL SULFATE (2.5 MG/3ML) 0.083% IN NEBU: 2.5000 mg | INHALATION_SOLUTION | Freq: Four times a day (QID) | RESPIRATORY_TRACT | Status: DC | PRN

## 2019-03-07 MED ORDER — OXYCODONE HCL 5 MG PO TABS
5.0000 mg | ORAL_TABLET | ORAL | Status: DC | PRN
  Filled 2019-03-07: qty 1

## 2019-03-07 NOTE — Plan of Care (Signed)
  Problem: Coping: Goal: Level of anxiety will decrease Outcome: Progressing   Problem: Pain Managment: Goal: General experience of comfort will improve Outcome: Progressing   

## 2019-03-07 NOTE — Progress Notes (Signed)
PT Cancellation Note  Patient Details Name: Katie [name] MRN: [id number] DOB: [birth year]   Cancelled Treatment:    Reason Eval/Treat Not Completed: Medical issues which prohibited therapy. Pt actively vomiting.  She did report she got to the [facility] earlier.  RN bringing nausea meds.  PT to follow up in AM.  Thanks,  Katie [name], PT, DPT  Acute Rehabilitation [phone] pager [phone]) [phone] office     Katie [name] 03/07/2019, 4:41 PM
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
## 2019-03-07 NOTE — TOC Initial Note (Signed)
Transition of Care [facility]) - Initial/Assessment Note    Patient Details  Name: Katie [name] MRN: [id number] Date of Birth: November 22, 1945  Transition of Care [facility]) CM/SW Contact:    Alexander Mt, [place] Phone Number: 03/07/2019, 3:42 PM  Clinical Narrative:                 Spoke with pt via telephone. Introduced self, role, reason for check in. Pt from home with son. As documented her daughter will provide 24/7 assist at dc. Pt confirmed PCP and pharmacy. She will need to get address for her daughters home as she could not remember address by heart. She has no DME currently at home, only uses an inhaler.   SBIRT complete, pt does not utilize any ETOH or other substances, screen negative, no needs identified.   TOC team will follow.   Expected Discharge Plan: Home/Self Care Barriers to Discharge: Continued Medical Work up   Patient Goals and CMS Choice Patient states their goals for this hospitalization and ongoing recovery are:: to get home with daughter   Choice offered to / list presented to : Patient  Expected Discharge Plan and Services Expected Discharge Plan: Home/Self Care In-house Referral: Clinical Social Work Discharge Planning Services: CM Consult Post Acute Care Choice: Durable Medical Equipment Living arrangements for the past 2 months: Single Family Home Expected Discharge Date: 03/08/19                                    Prior Living Arrangements/Services Living arrangements for the past 2 months: Single Family Home Lives with:: Adult Children Patient language and need for interpreter reviewed:: Yes(speaks English and Spanish, no interpreter needed) Do you feel safe going back to the place where you live?: Yes      Need for Family Participation in Patient Care: Yes (Comment)(support with ADLs and IADLs) Care giver support system in place?: Yes (comment)(adult children)   Criminal Activity/Legal Involvement Pertinent to Current  Situation/Hospitalization: No - Comment as needed  Activities of Daily Living Home Assistive Devices/Equipment: None ADL Screening (condition at time of admission) Patient's cognitive ability adequate to safely complete daily activities?: Yes Is the patient deaf or have difficulty hearing?: No Does the patient have difficulty seeing, even when wearing glasses/contacts?: No Does the patient have difficulty concentrating, remembering, or making decisions?: No Patient able to express need for assistance with ADLs?: Yes Does the patient have difficulty dressing or bathing?: No Independently performs ADLs?: No Communication: Independent Is this a change from baseline?: Pre-admission baseline Dressing (OT): Needs assistance Is this a change from baseline?: Change from baseline, expected to last >3 days Grooming: Independent Feeding: Independent Bathing: Needs assistance Is this a change from baseline?: Change from baseline, expected to last >3 days Toileting: Needs assistance Is this a change from baseline?: Change from baseline, expected to last >3days In/Out Bed: Needs assistance Is this a change from baseline?: Change from baseline, expected to last >3 days Walks in Home: Needs assistance Is this a change from baseline?: Change from baseline, expected to last >3 days Does the patient have difficulty walking or climbing stairs?: Yes Weakness of Legs: Right Weakness of Arms/Hands: None  Permission Sought/Granted Permission sought to share information with : Case Manager, Family Supports Permission granted to share information with : Yes, Verbal Permission Granted  Share Information with NAME: Lalla Brothers     Permission granted to share info w Relationship: daughter  Permission granted to share info w Contact Information: [phone]  Emotional Assessment Appearance:: Other (Comment Required(spoke with pt on telephone) Attitude/Demeanor/Rapport: Engaged, Gracious Affect  (typically observed): Accepting, Adaptable(spoke with pt on phone) Orientation: : Oriented to Self, Oriented to Place, Oriented to Situation, Oriented to  Time Alcohol / Substance Use: Tobacco Use Psych Involvement: No (comment)  Admission diagnosis:  Lumbar pain [M54.5] Pain [R52] Patient Active Problem List   Diagnosis Date Noted  . Fx metatarsal 03/07/2019   PCP:  Brunetta Jeans, PA-C Pharmacy:   [facility] [address] [place] [address] Phone: [phone] Fax: [phone]     Social Determinants of Health (SDOH) Interventions    Readmission Risk Interventions No flowsheet data found.
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
## 2019-03-07 NOTE — Progress Notes (Signed)
Patient has had persistent pain requiring IV pain medication. She has also been intermittently hypotensive and remains on IVF for pressure support; her home blood pressure medicines (HCTA and cozaar) will be held. She also has elevated creatinine which is improving with IVF. Will need to continue IVF and recheck BMP tomorrow. Due to the above, patient meets criteria for inpatient admission.

## 2019-03-08 ENCOUNTER — Encounter (HOSPITAL_COMMUNITY): Payer: Self-pay | Admitting: *Deleted

## 2019-03-08 LAB — CBC
HCT: 30.8 % — ABNORMAL LOW (ref 36.0–46.0)
Hemoglobin: 9.8 g/dL — ABNORMAL LOW (ref 12.0–15.0)
MCH: 29.2 pg (ref 26.0–34.0)
MCHC: 31.8 g/dL (ref 30.0–36.0)
MCV: 91.7 fL (ref 80.0–100.0)
Platelets: 192 10*3/uL (ref 150–400)
RBC: 3.36 MIL/uL — ABNORMAL LOW (ref 3.87–5.11)
RDW: 13 % (ref 11.5–15.5)
WBC: 5.7 10*3/uL (ref 4.0–10.5)
nRBC: 0 % (ref 0.0–0.2)

## 2019-03-08 LAB — BASIC METABOLIC PANEL
Anion gap: 8 (ref 5–15)
BUN: 9 mg/dL (ref 8–23)
CO2: 26 mmol/L (ref 22–32)
Calcium: 8.8 mg/dL — ABNORMAL LOW (ref 8.9–10.3)
Chloride: 107 mmol/L (ref 98–111)
Creatinine, Ser: 1.43 mg/dL — ABNORMAL HIGH (ref 0.44–1.00)
GFR calc Af Amer: 43 mL/min — ABNORMAL LOW (ref >60)
GFR calc non Af Amer: 37 mL/min — ABNORMAL LOW (ref >60)
Glucose, Bld: 96 mg/dL (ref 70–99)
Potassium: 4 mmol/L (ref 3.5–5.1)
Sodium: 141 mmol/L (ref 135–145)

## 2019-03-08 MED ORDER — ALBUTEROL SULFATE (2.5 MG/3ML) 0.083% IN NEBU
2.5000 mg | INHALATION_SOLUTION | Freq: Four times a day (QID) | RESPIRATORY_TRACT | Status: AC
  Administered 2019-03-08 (×3): 2.5 mg via RESPIRATORY_TRACT
  Filled 2019-03-08 (×4): qty 3

## 2019-03-08 MED ORDER — MORPHINE SULFATE (PF) 2 MG/ML IV SOLN: 0.5000 mg | INTRAVENOUS | Status: DC | PRN
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
## 2019-03-08 NOTE — Discharge Instructions (Signed)
Wear the CAM boot when ambulating, ok to remove in bed and to shower Wear LSO/back brace when out of bed    1. PAIN CONTROL:  1. Pain is best controlled by a usual combination of three different methods TOGETHER:  i. Ice/Heat ii. Over the counter pain medication iii. Prescription pain medication 2. You may experience some swelling and bruising in area of wounds. Ice packs or heating pads (30-60 minutes up to 6 times a day) will help. Use ice for the first few days to help decrease swelling and bruising, then switch to heat to help relax tight/sore spots and speed recovery. Some people prefer to use ice alone, heat alone, alternating between ice & heat. Experiment to what works for you. Swelling and bruising can take several weeks to resolve.  3. It is helpful to take an over-the-counter pain medication regularly for the first few weeks. Choose one of the following that works best for you:  i. Naproxen (Aleve, etc) Two 220mg  tabs twice a day ii. Ibuprofen (Advil, etc) Three 200mg  tabs four times a day (every meal & bedtime) iii. Acetaminophen (Tylenol, etc) 500-650mg  four times a day (every meal & bedtime) 4. A prescription for pain medication (such as oxycodone, hydrocodone, etc) may be given to you upon discharge. Take your pain medication as prescribed.  i. If you are having problems/concerns with the prescription medicine (does not control pain, nausea, vomiting, rash, itching, etc), please call us [phone] to see if we need to switch you to a different pain medicine that will work better for you and/or control your side effect better. ii. If you need a refill on your pain medication, please contact your pharmacy. They will contact our office to request authorization. Prescriptions will not be filled after 5 pm or on week-ends. 1. Avoid getting constipated. When taking pain medications, it is common to experience some constipation. Increasing fluid intake and taking a fiber supplement  (such as Metamucil, Citrucel, FiberCon, MiraLax, etc) 1-2 times a day regularly will usually help prevent this problem from occurring. A mild laxative (prune juice, Milk of Magnesia, MiraLax, etc) should be taken according to package directions if there are no bowel movements after 48 hours.  2. Watch out for diarrhea. If you have many loose bowel movements, simplify your diet to bland foods & liquids for a few days. Stop any stool softeners and decrease your fiber supplement. Switching to mild anti-diarrheal medications (Kayopectate, Pepto Bismol) can help. If this worsens or does not improve, please call us. 3. Shower daily but do not bathe until your wounds heal. Cover your wounds with clean gauze and tape after showering.  4. FOLLOW UP  a. If a follow up appointment is needed one will be scheduled for you. If none is needed with our trauma team, please follow up with your primary care provider within 2-3 weeks from discharge. Please call [facility] at [phone] if you have any questions about follow up.   WHEN TO CALL us [phone]:  1. Poor pain control 2. Reactions / problems with new medications (rash/itching, nausea, etc)  3. Fever over 101.5 F (38.5 C) 4. Worsening swelling or bruising 5. Redness, swelling, foul discharge or increased pain from wounds 6. Productive cough, difficulty breathing or any other concerning symptoms  The clinic staff is available to answer your questions during regular business hours (8:30am-5pm). Please dont hesitate to call and ask to speak to one of our nurses for clinical concerns.  If you have  a medical emergency, go to the nearest emergency room or call 911.  A surgeon from [facility] is always on call at the [facility], [place], [place], [place], [place] [address] ?  MAIN: [phone] ? TOLL FREE: [phone] ?  FAX [phone]  [email or web address]

## 2019-03-09 ENCOUNTER — Inpatient Hospital Stay (HOSPITAL_COMMUNITY): Payer: Medicare Other

## 2019-03-09 LAB — BASIC METABOLIC PANEL
Anion gap: 9 (ref 5–15)
BUN: 12 mg/dL (ref 8–23)
CO2: 21 mmol/L — ABNORMAL LOW (ref 22–32)
Calcium: 8.7 mg/dL — ABNORMAL LOW (ref 8.9–10.3)
Chloride: 109 mmol/L (ref 98–111)
Creatinine, Ser: 1.26 mg/dL — ABNORMAL HIGH (ref 0.44–1.00)
GFR calc Af Amer: 50 mL/min — ABNORMAL LOW (ref >60)
GFR calc non Af Amer: 43 mL/min — ABNORMAL LOW (ref >60)
Glucose, Bld: 99 mg/dL (ref 70–99)
Potassium: 5 mmol/L (ref 3.5–5.1)
Sodium: 139 mmol/L (ref 135–145)

## 2019-03-09 MED ORDER — ALBUTEROL SULFATE (2.5 MG/3ML) 0.083% IN NEBU
2.5000 mg | INHALATION_SOLUTION | Freq: Four times a day (QID) | RESPIRATORY_TRACT | Status: AC
  Administered 2019-03-09 (×3): 2.5 mg via RESPIRATORY_TRACT
  Filled 2019-03-09 (×3): qty 3

## 2019-03-09 MED ORDER — FUROSEMIDE 10 MG/ML IJ SOLN
20.0000 mg | Freq: Once | INTRAMUSCULAR | Status: AC
  Administered 2019-03-09: 20 mg via INTRAVENOUS
  Filled 2019-03-09: qty 2
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
## 2019-03-09 NOTE — Progress Notes (Signed)
Patient woke up from sleep stating that she was having trouble breathing and think it was caused by medications that was given. Only gave patient her scheduled meds and patient requested her sleeping pill Unisom. Believes it could have been the sleeping pill that triggered it. Patient placed on nasal cannula 4L for comfort. O2 sat 100%. Will continue to monitor.
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
## 2019-03-10 ENCOUNTER — Encounter: Payer: Self-pay | Admitting: Physician Assistant

## 2019-03-10 ENCOUNTER — Telehealth: Payer: Self-pay | Admitting: Physician Assistant

## 2019-03-10 LAB — BASIC METABOLIC PANEL
Anion gap: 8 (ref 5–15)
BUN: 11 mg/dL (ref 8–23)
CO2: 26 mmol/L (ref 22–32)
Calcium: 8.9 mg/dL (ref 8.9–10.3)
Chloride: 104 mmol/L (ref 98–111)
Creatinine, Ser: 1.39 mg/dL — ABNORMAL HIGH (ref 0.44–1.00)
GFR calc Af Amer: 44 mL/min — ABNORMAL LOW (ref >60)
GFR calc non Af Amer: 38 mL/min — ABNORMAL LOW (ref >60)
Glucose, Bld: 109 mg/dL — ABNORMAL HIGH (ref 70–99)
Potassium: 3.7 mmol/L (ref 3.5–5.1)
Sodium: 138 mmol/L (ref 135–145)

## 2019-03-10 MED ORDER — DOCUSATE SODIUM 100 MG PO CAPS: 100.0000 mg | ORAL_CAPSULE | Freq: Two times a day (BID) | ORAL | 0 refills | Status: DC

## 2019-03-10 MED ORDER — ALBUTEROL SULFATE (2.5 MG/3ML) 0.083% IN NEBU
2.5000 mg | INHALATION_SOLUTION | Freq: Four times a day (QID) | RESPIRATORY_TRACT | Status: DC | PRN
  Administered 2019-03-10: 2.5 mg via RESPIRATORY_TRACT

## 2019-03-10 MED ORDER — ACETAMINOPHEN 325 MG PO TABS: 650.0000 mg | ORAL_TABLET | Freq: Four times a day (QID) | ORAL | Status: DC | PRN

## 2019-03-10 MED ORDER — ZOLPIDEM TARTRATE ER 6.25 MG PO TBCR: 6.2500 mg | EXTENDED_RELEASE_TABLET | Freq: Every evening | ORAL | 0 refills | Status: DC | PRN

## 2019-03-10 MED ORDER — ALBUTEROL SULFATE (2.5 MG/3ML) 0.083% IN NEBU
INHALATION_SOLUTION | RESPIRATORY_TRACT | Status: AC
  Filled 2019-03-10: qty 3

## 2019-03-10 NOTE — Telephone Encounter (Signed)
Copied from [place] [id number]. Topic: Quick Communication - See Telephone Encounter >> Mar 10, 2019 10:18 AM Blase Mess A wrote: CRM for notification. See Telephone encounter for: 03/10/19.Patient is calling because she just got out the hospital. She is interested in getting a handicap sticker. A hospital follow up appt was scheduled. Please advise

## 2019-03-10 NOTE — Progress Notes (Signed)
Patient discharged to home with instructions and equipments. 
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
## 2019-03-10 NOTE — Telephone Encounter (Signed)
hosp f/up appt already scheduled for tomorrow, please advise on the Handicap sticker.

## 2019-03-10 NOTE — Telephone Encounter (Signed)
Spoke with PCP and he is okay with this. Patient has an appointment in the morning and he will document and address this during that visit.  Spoke with patient and she is aware.

## 2019-03-11 ENCOUNTER — Other Ambulatory Visit: Payer: Self-pay

## 2019-03-11 ENCOUNTER — Encounter: Payer: Self-pay | Admitting: Physician Assistant

## 2019-03-11 ENCOUNTER — Ambulatory Visit (INDEPENDENT_AMBULATORY_CARE_PROVIDER_SITE_OTHER): Payer: Medicare Other | Admitting: Physician Assistant

## 2019-03-11 VITALS — BP 125/84

## 2019-03-11 DIAGNOSIS — N183 Chronic kidney disease, stage 3 unspecified: Secondary | ICD-10-CM

## 2019-03-11 DIAGNOSIS — J449 Chronic obstructive pulmonary disease, unspecified: Secondary | ICD-10-CM | POA: Diagnosis not present

## 2019-03-11 DIAGNOSIS — S92354D Nondisplaced fracture of fifth metatarsal bone, right foot, subsequent encounter for fracture with routine healing: Secondary | ICD-10-CM | POA: Diagnosis not present

## 2019-03-11 DIAGNOSIS — S32038A Other fracture of third lumbar vertebra, initial encounter for closed fracture: Secondary | ICD-10-CM

## 2019-03-11 DIAGNOSIS — J189 Pneumonia, unspecified organism: Secondary | ICD-10-CM | POA: Diagnosis not present

## 2019-03-11 MED ORDER — PULSE OXIMETER FOR FINGER MISC: 1.0000 | 0 refills | Status: DC | PRN

## 2019-03-11 MED ORDER — ACETAMINOPHEN-CODEINE 300-30 MG PO TABS: 1.0000 | ORAL_TABLET | ORAL | 0 refills | Status: DC | PRN

## 2019-03-11 MED ORDER — DOXYCYCLINE HYCLATE 100 MG PO TABS: 100.0000 mg | ORAL_TABLET | Freq: Two times a day (BID) | ORAL | 0 refills | Status: DC
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
## 2019-03-11 NOTE — Progress Notes (Signed)
I have discussed the procedure for the virtual visit with the patient who has given consent to proceed with assessment and treatment.   Lesle Faron S Slaton Reaser, CMA     

## 2019-03-12 ENCOUNTER — Emergency Department (HOSPITAL_COMMUNITY): Payer: Medicare Other

## 2019-03-12 ENCOUNTER — Inpatient Hospital Stay (HOSPITAL_COMMUNITY)
Admission: EM | Admit: 2019-03-12 | Discharge: 2019-03-18 | DRG: 189 | Disposition: A | Discharge status: Home Health | Payer: Medicare Other | Attending: Family Medicine | Admitting: Family Medicine

## 2019-03-12 ENCOUNTER — Other Ambulatory Visit: Payer: Self-pay

## 2019-03-12 ENCOUNTER — Encounter (HOSPITAL_COMMUNITY): Payer: Self-pay | Admitting: Emergency Medicine

## 2019-03-12 DIAGNOSIS — N183 Chronic kidney disease, stage 3 unspecified: Secondary | ICD-10-CM | POA: Diagnosis present

## 2019-03-12 DIAGNOSIS — Z720 Tobacco use: Secondary | ICD-10-CM | POA: Diagnosis present

## 2019-03-12 DIAGNOSIS — I130 Hypertensive heart and chronic kidney disease with heart failure and stage 1 through stage 4 chronic kidney disease, or unspecified chronic kidney disease: Secondary | ICD-10-CM | POA: Diagnosis present

## 2019-03-12 DIAGNOSIS — Z85528 Personal history of other malignant neoplasm of kidney: Secondary | ICD-10-CM

## 2019-03-12 DIAGNOSIS — J441 Chronic obstructive pulmonary disease with (acute) exacerbation: Secondary | ICD-10-CM | POA: Diagnosis not present

## 2019-03-12 DIAGNOSIS — Z833 Family history of diabetes mellitus: Secondary | ICD-10-CM | POA: Diagnosis not present

## 2019-03-12 DIAGNOSIS — Z905 Acquired absence of kidney: Secondary | ICD-10-CM

## 2019-03-12 DIAGNOSIS — E872 Acidosis: Secondary | ICD-10-CM | POA: Diagnosis present

## 2019-03-12 DIAGNOSIS — E1165 Type 2 diabetes mellitus with hyperglycemia: Secondary | ICD-10-CM | POA: Diagnosis present

## 2019-03-12 DIAGNOSIS — S32039D Unspecified fracture of third lumbar vertebra, subsequent encounter for fracture with routine healing: Secondary | ICD-10-CM | POA: Diagnosis not present

## 2019-03-12 DIAGNOSIS — I5033 Acute on chronic diastolic (congestive) heart failure: Secondary | ICD-10-CM | POA: Diagnosis not present

## 2019-03-12 DIAGNOSIS — M1611 Unilateral primary osteoarthritis, right hip: Secondary | ICD-10-CM | POA: Diagnosis not present

## 2019-03-12 DIAGNOSIS — R9431 Abnormal electrocardiogram [ECG] [EKG]: Secondary | ICD-10-CM | POA: Diagnosis not present

## 2019-03-12 DIAGNOSIS — R069 Unspecified abnormalities of breathing: Secondary | ICD-10-CM | POA: Diagnosis not present

## 2019-03-12 DIAGNOSIS — D649 Anemia, unspecified: Secondary | ICD-10-CM | POA: Diagnosis present

## 2019-03-12 DIAGNOSIS — S92351D Displaced fracture of fifth metatarsal bone, right foot, subsequent encounter for fracture with routine healing: Secondary | ICD-10-CM | POA: Diagnosis not present

## 2019-03-12 DIAGNOSIS — N179 Acute kidney failure, unspecified: Secondary | ICD-10-CM | POA: Diagnosis present

## 2019-03-12 DIAGNOSIS — J410 Simple chronic bronchitis: Secondary | ICD-10-CM | POA: Diagnosis present

## 2019-03-12 DIAGNOSIS — S92354D Nondisplaced fracture of fifth metatarsal bone, right foot, subsequent encounter for fracture with routine healing: Secondary | ICD-10-CM | POA: Diagnosis not present

## 2019-03-12 DIAGNOSIS — J9601 Acute respiratory failure with hypoxia: Principal | ICD-10-CM | POA: Diagnosis present

## 2019-03-12 DIAGNOSIS — I10 Essential (primary) hypertension: Secondary | ICD-10-CM | POA: Diagnosis present

## 2019-03-12 DIAGNOSIS — J9602 Acute respiratory failure with hypercapnia: Secondary | ICD-10-CM | POA: Diagnosis not present

## 2019-03-12 DIAGNOSIS — Z20828 Contact with and (suspected) exposure to other viral communicable diseases: Secondary | ICD-10-CM | POA: Diagnosis present

## 2019-03-12 DIAGNOSIS — R739 Hyperglycemia, unspecified: Secondary | ICD-10-CM | POA: Diagnosis present

## 2019-03-12 DIAGNOSIS — F17210 Nicotine dependence, cigarettes, uncomplicated: Secondary | ICD-10-CM | POA: Diagnosis present

## 2019-03-12 DIAGNOSIS — I5031 Acute diastolic (congestive) heart failure: Secondary | ICD-10-CM | POA: Diagnosis not present

## 2019-03-12 DIAGNOSIS — R0602 Shortness of breath: Secondary | ICD-10-CM | POA: Diagnosis not present

## 2019-03-12 DIAGNOSIS — I341 Nonrheumatic mitral (valve) prolapse: Secondary | ICD-10-CM | POA: Diagnosis present

## 2019-03-12 DIAGNOSIS — E1122 Type 2 diabetes mellitus with diabetic chronic kidney disease: Secondary | ICD-10-CM | POA: Diagnosis present

## 2019-03-12 DIAGNOSIS — N1831 Chronic kidney disease, stage 3a: Secondary | ICD-10-CM | POA: Diagnosis present

## 2019-03-12 DIAGNOSIS — M47812 Spondylosis without myelopathy or radiculopathy, cervical region: Secondary | ICD-10-CM | POA: Diagnosis not present

## 2019-03-12 DIAGNOSIS — R0902 Hypoxemia: Secondary | ICD-10-CM | POA: Diagnosis not present

## 2019-03-12 DIAGNOSIS — I340 Nonrheumatic mitral (valve) insufficiency: Secondary | ICD-10-CM

## 2019-03-12 DIAGNOSIS — T380X6A Underdosing of glucocorticoids and synthetic analogues, initial encounter: Secondary | ICD-10-CM | POA: Diagnosis not present

## 2019-03-12 DIAGNOSIS — E785 Hyperlipidemia, unspecified: Secondary | ICD-10-CM | POA: Diagnosis present

## 2019-03-12 DIAGNOSIS — T445X6A Underdosing of predominantly beta-adrenoreceptor agonists, initial encounter: Secondary | ICD-10-CM | POA: Diagnosis not present

## 2019-03-12 DIAGNOSIS — E8729 Other acidosis: Secondary | ICD-10-CM

## 2019-03-12 DIAGNOSIS — J449 Chronic obstructive pulmonary disease, unspecified: Secondary | ICD-10-CM | POA: Diagnosis not present

## 2019-03-12 DIAGNOSIS — R918 Other nonspecific abnormal finding of lung field: Secondary | ICD-10-CM | POA: Diagnosis not present

## 2019-03-12 DIAGNOSIS — R0689 Other abnormalities of breathing: Secondary | ICD-10-CM | POA: Diagnosis not present

## 2019-03-12 DIAGNOSIS — Z825 Family history of asthma and other chronic lower respiratory diseases: Secondary | ICD-10-CM

## 2019-03-12 DIAGNOSIS — S32038D Other fracture of third lumbar vertebra, subsequent encounter for fracture with routine healing: Secondary | ICD-10-CM | POA: Diagnosis not present

## 2019-03-12 LAB — BASIC METABOLIC PANEL
Anion gap: 8 (ref 5–15)
BUN: 23 mg/dL (ref 8–23)
CO2: 23 mmol/L (ref 22–32)
Calcium: 9.3 mg/dL (ref 8.9–10.3)
Chloride: 104 mmol/L (ref 98–111)
Creatinine, Ser: 1.4 mg/dL — ABNORMAL HIGH (ref 0.44–1.00)
GFR calc Af Amer: 44 mL/min — ABNORMAL LOW (ref >60)
GFR calc non Af Amer: 38 mL/min — ABNORMAL LOW (ref >60)
Glucose, Bld: 217 mg/dL — ABNORMAL HIGH (ref 70–99)
Potassium: 4.9 mmol/L (ref 3.5–5.1)
Sodium: 135 mmol/L (ref 135–145)

## 2019-03-12 LAB — BLOOD GAS, VENOUS
Acid-base deficit: 4.7 mmol/L — ABNORMAL HIGH (ref 0.0–2.0)
Bicarbonate: 25.1 mmol/L (ref 20.0–28.0)
FIO2: 15
O2 Saturation: 83.5 %
Patient temperature: 98.6
pCO2, Ven: 71.1 mmHg (ref 44.0–60.0)
pH, Ven: 7.173 — CL (ref 7.250–7.430)
pO2, Ven: 63.4 mmHg — ABNORMAL HIGH (ref 32.0–45.0)

## 2019-03-12 LAB — CBC WITH DIFFERENTIAL/PLATELET
Abs Immature Granulocytes: 0.11 10*3/uL — ABNORMAL HIGH (ref 0.00–0.07)
Basophils Absolute: 0.1 10*3/uL (ref 0.0–0.1)
Basophils Relative: 1 %
Eosinophils Absolute: 0.2 10*3/uL (ref 0.0–0.5)
Eosinophils Relative: 2 %
HCT: 36.9 % (ref 36.0–46.0)
Hemoglobin: 11.4 g/dL — ABNORMAL LOW (ref 12.0–15.0)
Immature Granulocytes: 1 %
Lymphocytes Relative: 13 %
Lymphs Abs: 1.7 10*3/uL (ref 0.7–4.0)
MCH: 29.9 pg (ref 26.0–34.0)
MCHC: 30.9 g/dL (ref 30.0–36.0)
MCV: 96.9 fL (ref 80.0–100.0)
Monocytes Absolute: 0.7 10*3/uL (ref 0.1–1.0)
Monocytes Relative: 5 %
Neutro Abs: 10.4 10*3/uL — ABNORMAL HIGH (ref 1.7–7.7)
Neutrophils Relative %: 78 %
Platelets: 468 10*3/uL — ABNORMAL HIGH (ref 150–400)
RBC: 3.81 MIL/uL — ABNORMAL LOW (ref 3.87–5.11)
RDW: 13.2 % (ref 11.5–15.5)
WBC: 13.2 10*3/uL — ABNORMAL HIGH (ref 4.0–10.5)
nRBC: 0 % (ref 0.0–0.2)

## 2019-03-12 LAB — TROPONIN I: Troponin I: <0.03 ng/mL (ref <0.03)

## 2019-03-12 MED ORDER — METHYLPREDNISOLONE SODIUM SUCC 125 MG IJ SOLR
125.0000 mg | Freq: Once | INTRAMUSCULAR | Status: AC
  Administered 2019-03-12: 125 mg via INTRAVENOUS
  Filled 2019-03-12: qty 2

## 2019-03-12 MED ORDER — ALBUTEROL SULFATE HFA 108 (90 BASE) MCG/ACT IN AERS
8.0000 | INHALATION_SPRAY | Freq: Once | RESPIRATORY_TRACT | Status: AC
  Administered 2019-03-12: 8 via RESPIRATORY_TRACT
  Filled 2019-03-12: qty 6.7

## 2019-03-12 MED ORDER — MAGNESIUM SULFATE 2 GM/50ML IV SOLN
2.0000 g | Freq: Once | INTRAVENOUS | Status: AC
  Administered 2019-03-12: 2 g via INTRAVENOUS
  Filled 2019-03-12: qty 50

## 2019-03-12 MED ORDER — SODIUM CHLORIDE 0.9 % IV SOLN
INTRAVENOUS | Status: DC
  Administered 2019-03-12: 23:00:00 via INTRAVENOUS
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
## 2019-03-12 NOTE — ED Triage Notes (Signed)
Arrives via EMS from home. C/C SOB x3 hours, hx asthma, COPD. Ned treatment and inhaler used without relief. Arrives on non-rebreather 15 liters.

## 2019-03-12 NOTE — ED Notes (Signed)
Bed: WA09 Expected date:  Expected time:  Means of arrival:  Comments: 88F SOB

## 2019-03-13 ENCOUNTER — Encounter (HOSPITAL_COMMUNITY): Payer: Self-pay

## 2019-03-13 ENCOUNTER — Inpatient Hospital Stay (HOSPITAL_COMMUNITY): Payer: Medicare Other

## 2019-03-13 DIAGNOSIS — N183 Chronic kidney disease, stage 3 (moderate): Secondary | ICD-10-CM | POA: Diagnosis not present

## 2019-03-13 DIAGNOSIS — F17210 Nicotine dependence, cigarettes, uncomplicated: Secondary | ICD-10-CM | POA: Diagnosis present

## 2019-03-13 DIAGNOSIS — J441 Chronic obstructive pulmonary disease with (acute) exacerbation: Secondary | ICD-10-CM | POA: Diagnosis not present

## 2019-03-13 DIAGNOSIS — Z833 Family history of diabetes mellitus: Secondary | ICD-10-CM | POA: Diagnosis not present

## 2019-03-13 DIAGNOSIS — I130 Hypertensive heart and chronic kidney disease with heart failure and stage 1 through stage 4 chronic kidney disease, or unspecified chronic kidney disease: Secondary | ICD-10-CM | POA: Diagnosis present

## 2019-03-13 DIAGNOSIS — Z825 Family history of asthma and other chronic lower respiratory diseases: Secondary | ICD-10-CM | POA: Diagnosis not present

## 2019-03-13 DIAGNOSIS — Z20828 Contact with and (suspected) exposure to other viral communicable diseases: Secondary | ICD-10-CM | POA: Diagnosis present

## 2019-03-13 DIAGNOSIS — S32039D Unspecified fracture of third lumbar vertebra, subsequent encounter for fracture with routine healing: Secondary | ICD-10-CM | POA: Diagnosis not present

## 2019-03-13 DIAGNOSIS — E1122 Type 2 diabetes mellitus with diabetic chronic kidney disease: Secondary | ICD-10-CM | POA: Diagnosis present

## 2019-03-13 DIAGNOSIS — S92351D Displaced fracture of fifth metatarsal bone, right foot, subsequent encounter for fracture with routine healing: Secondary | ICD-10-CM | POA: Diagnosis not present

## 2019-03-13 DIAGNOSIS — Z720 Tobacco use: Secondary | ICD-10-CM | POA: Diagnosis not present

## 2019-03-13 DIAGNOSIS — I340 Nonrheumatic mitral (valve) insufficiency: Secondary | ICD-10-CM | POA: Diagnosis not present

## 2019-03-13 DIAGNOSIS — J9601 Acute respiratory failure with hypoxia: Secondary | ICD-10-CM | POA: Diagnosis not present

## 2019-03-13 DIAGNOSIS — E872 Acidosis: Secondary | ICD-10-CM | POA: Diagnosis not present

## 2019-03-13 DIAGNOSIS — J9602 Acute respiratory failure with hypercapnia: Secondary | ICD-10-CM

## 2019-03-13 DIAGNOSIS — I341 Nonrheumatic mitral (valve) prolapse: Secondary | ICD-10-CM | POA: Diagnosis present

## 2019-03-13 DIAGNOSIS — I5031 Acute diastolic (congestive) heart failure: Secondary | ICD-10-CM | POA: Diagnosis not present

## 2019-03-13 DIAGNOSIS — T445X6A Underdosing of predominantly beta-adrenoreceptor agonists, initial encounter: Secondary | ICD-10-CM | POA: Diagnosis present

## 2019-03-13 DIAGNOSIS — N179 Acute kidney failure, unspecified: Secondary | ICD-10-CM | POA: Diagnosis not present

## 2019-03-13 DIAGNOSIS — J410 Simple chronic bronchitis: Secondary | ICD-10-CM | POA: Diagnosis present

## 2019-03-13 DIAGNOSIS — I10 Essential (primary) hypertension: Secondary | ICD-10-CM | POA: Diagnosis not present

## 2019-03-13 DIAGNOSIS — I5033 Acute on chronic diastolic (congestive) heart failure: Secondary | ICD-10-CM | POA: Diagnosis not present

## 2019-03-13 DIAGNOSIS — T380X6A Underdosing of glucocorticoids and synthetic analogues, initial encounter: Secondary | ICD-10-CM | POA: Diagnosis present

## 2019-03-13 DIAGNOSIS — Z8709 Personal history of other diseases of the respiratory system: Secondary | ICD-10-CM | POA: Diagnosis not present

## 2019-03-13 DIAGNOSIS — Z85528 Personal history of other malignant neoplasm of kidney: Secondary | ICD-10-CM | POA: Diagnosis not present

## 2019-03-13 DIAGNOSIS — I129 Hypertensive chronic kidney disease with stage 1 through stage 4 chronic kidney disease, or unspecified chronic kidney disease: Secondary | ICD-10-CM | POA: Diagnosis not present

## 2019-03-13 DIAGNOSIS — R739 Hyperglycemia, unspecified: Secondary | ICD-10-CM

## 2019-03-13 DIAGNOSIS — R9431 Abnormal electrocardiogram [ECG] [EKG]: Secondary | ICD-10-CM

## 2019-03-13 DIAGNOSIS — E785 Hyperlipidemia, unspecified: Secondary | ICD-10-CM

## 2019-03-13 DIAGNOSIS — Z905 Acquired absence of kidney: Secondary | ICD-10-CM | POA: Diagnosis not present

## 2019-03-13 DIAGNOSIS — D649 Anemia, unspecified: Secondary | ICD-10-CM | POA: Diagnosis present

## 2019-03-13 DIAGNOSIS — E1165 Type 2 diabetes mellitus with hyperglycemia: Secondary | ICD-10-CM | POA: Diagnosis present

## 2019-03-13 LAB — BLOOD GAS, ARTERIAL
Acid-base deficit: 3.9 mmol/L — ABNORMAL HIGH (ref 0.0–2.0)
Bicarbonate: 21.4 mmol/L (ref 20.0–28.0)
Delivery systems: POSITIVE
Drawn by: 331001
Expiratory PAP: 5
FIO2: 35
Inspiratory PAP: 14
O2 Saturation: 97.2 %
Patient temperature: 97.3
pCO2 arterial: 41.4 mmHg (ref 32.0–48.0)
pH, Arterial: 7.329 — ABNORMAL LOW (ref 7.350–7.450)
pO2, Arterial: 99 mmHg (ref 83.0–108.0)

## 2019-03-13 LAB — GLUCOSE, CAPILLARY
Glucose-Capillary: 121 mg/dL — ABNORMAL HIGH (ref 70–99)
Glucose-Capillary: 136 mg/dL — ABNORMAL HIGH (ref 70–99)
Glucose-Capillary: 146 mg/dL — ABNORMAL HIGH (ref 70–99)
Glucose-Capillary: 148 mg/dL — ABNORMAL HIGH (ref 70–99)
Glucose-Capillary: 154 mg/dL — ABNORMAL HIGH (ref 70–99)

## 2019-03-13 LAB — CBC
HCT: 30.8 % — ABNORMAL LOW (ref 36.0–46.0)
Hemoglobin: 9.2 g/dL — ABNORMAL LOW (ref 12.0–15.0)
MCH: 29.4 pg (ref 26.0–34.0)
MCHC: 29.9 g/dL — ABNORMAL LOW (ref 30.0–36.0)
MCV: 98.4 fL (ref 80.0–100.0)
Platelets: 277 10*3/uL (ref 150–400)
RBC: 3.13 MIL/uL — ABNORMAL LOW (ref 3.87–5.11)
RDW: 13.1 % (ref 11.5–15.5)
WBC: 5.1 10*3/uL (ref 4.0–10.5)
nRBC: 0 % (ref 0.0–0.2)

## 2019-03-13 LAB — COMPREHENSIVE METABOLIC PANEL
ALT: 30 U/L (ref 0–44)
AST: 35 U/L (ref 15–41)
Albumin: 2.9 g/dL — ABNORMAL LOW (ref 3.5–5.0)
Alkaline Phosphatase: 75 U/L (ref 38–126)
Anion gap: 8 (ref 5–15)
BUN: 21 mg/dL (ref 8–23)
CO2: 19 mmol/L — ABNORMAL LOW (ref 22–32)
Calcium: 8.5 mg/dL — ABNORMAL LOW (ref 8.9–10.3)
Chloride: 109 mmol/L (ref 98–111)
Creatinine, Ser: 1.19 mg/dL — ABNORMAL HIGH (ref 0.44–1.00)
GFR calc Af Amer: 53 mL/min — ABNORMAL LOW (ref >60)
GFR calc non Af Amer: 46 mL/min — ABNORMAL LOW (ref >60)
Glucose, Bld: 129 mg/dL — ABNORMAL HIGH (ref 70–99)
Potassium: 4.7 mmol/L (ref 3.5–5.1)
Sodium: 136 mmol/L (ref 135–145)
Total Bilirubin: 0.8 mg/dL (ref 0.3–1.2)
Total Protein: 6.4 g/dL — ABNORMAL LOW (ref 6.5–8.1)

## 2019-03-13 LAB — URINALYSIS, ROUTINE W REFLEX MICROSCOPIC
Bilirubin Urine: NEGATIVE
Glucose, UA: NEGATIVE mg/dL
Hgb urine dipstick: NEGATIVE
Ketones, ur: NEGATIVE mg/dL
Leukocytes,Ua: NEGATIVE
Nitrite: NEGATIVE
Protein, ur: NEGATIVE mg/dL
Specific Gravity, Urine: 1.009 (ref 1.005–1.030)
pH: 5 (ref 5.0–8.0)

## 2019-03-13 LAB — MAGNESIUM: Magnesium: 2.5 mg/dL — ABNORMAL HIGH (ref 1.7–2.4)

## 2019-03-13 LAB — STREP PNEUMONIAE URINARY ANTIGEN: Strep Pneumo Urinary Antigen: NEGATIVE

## 2019-03-13 LAB — TSH: TSH: 0.749 u[IU]/mL (ref 0.350–4.500)

## 2019-03-13 LAB — TROPONIN I: Troponin I: <0.03 ng/mL (ref <0.03)

## 2019-03-13 LAB — PHOSPHORUS: Phosphorus: 4.1 mg/dL (ref 2.5–4.6)

## 2019-03-13 LAB — ECHOCARDIOGRAM COMPLETE
Height: 61 in
Weight: 2419.77 oz

## 2019-03-13 LAB — BRAIN NATRIURETIC PEPTIDE: B Natriuretic Peptide: 817.2 pg/mL — ABNORMAL HIGH (ref 0.0–100.0)

## 2019-03-13 LAB — SARS CORONAVIRUS 2 BY RT PCR (HOSPITAL ORDER, PERFORMED IN ~~LOC~~ HOSPITAL LAB): SARS Coronavirus 2: NEGATIVE

## 2019-03-13 LAB — MRSA PCR SCREENING: MRSA by PCR: NEGATIVE

## 2019-03-13 MED ORDER — IPRATROPIUM-ALBUTEROL 0.5-2.5 (3) MG/3ML IN SOLN
3.0000 mL | Freq: Four times a day (QID) | RESPIRATORY_TRACT | Status: DC
  Administered 2019-03-13 (×4): 3 mL via RESPIRATORY_TRACT
  Filled 2019-03-13 (×4): qty 3

## 2019-03-13 MED ORDER — SODIUM CHLORIDE 0.9 % IV SOLN
2.0000 g | Freq: Two times a day (BID) | INTRAVENOUS | Status: DC
  Administered 2019-03-13 – 2019-03-14 (×3): 2 g via INTRAVENOUS
  Filled 2019-03-13 (×3): qty 2

## 2019-03-13 MED ORDER — ACETAMINOPHEN 325 MG PO TABS
650.0000 mg | ORAL_TABLET | Freq: Four times a day (QID) | ORAL | Status: DC | PRN
  Administered 2019-03-14 – 2019-03-16 (×4): 650 mg via ORAL
  Filled 2019-03-13 (×4): qty 2

## 2019-03-13 MED ORDER — SENNA 8.6 MG PO TABS
1.0000 | ORAL_TABLET | Freq: Two times a day (BID) | ORAL | Status: DC
  Administered 2019-03-13 – 2019-03-17 (×3): 8.6 mg via ORAL
  Filled 2019-03-13 (×6): qty 1

## 2019-03-13 MED ORDER — SODIUM CHLORIDE 0.9 % IV SOLN
500.0000 mg | Freq: Once | INTRAVENOUS | Status: AC
  Administered 2019-03-13: 500 mg via INTRAVENOUS
  Filled 2019-03-13: qty 500

## 2019-03-13 MED ORDER — LORAZEPAM 2 MG/ML IJ SOLN
1.0000 mg | Freq: Once | INTRAMUSCULAR | Status: AC
  Administered 2019-03-13: 01:00:00 1 mg via INTRAVENOUS
  Filled 2019-03-13: qty 1

## 2019-03-13 MED ORDER — IPRATROPIUM-ALBUTEROL 0.5-2.5 (3) MG/3ML IN SOLN: 3.0000 mL | Freq: Three times a day (TID) | RESPIRATORY_TRACT | Status: DC

## 2019-03-13 MED ORDER — NICOTINE 21 MG/24HR TD PT24
21.0000 mg | MEDICATED_PATCH | Freq: Every day | TRANSDERMAL | Status: DC
  Administered 2019-03-13 – 2019-03-17 (×5): 21 mg via TRANSDERMAL
  Filled 2019-03-13 (×6): qty 1

## 2019-03-13 MED ORDER — MOMETASONE FURO-FORMOTEROL FUM 200-5 MCG/ACT IN AERO
1.0000 | INHALATION_SPRAY | Freq: Two times a day (BID) | RESPIRATORY_TRACT | Status: DC
  Administered 2019-03-13 – 2019-03-18 (×10): 1 via RESPIRATORY_TRACT
  Filled 2019-03-13: qty 8.8

## 2019-03-13 MED ORDER — VANCOMYCIN HCL 10 G IV SOLR
1500.0000 mg | Freq: Once | INTRAVENOUS | Status: AC
  Administered 2019-03-13: 1500 mg via INTRAVENOUS
  Filled 2019-03-13: qty 1500

## 2019-03-13 MED ORDER — HYDROCODONE-ACETAMINOPHEN 5-325 MG PO TABS: 1.0000 | ORAL_TABLET | ORAL | Status: DC | PRN

## 2019-03-13 MED ORDER — ATORVASTATIN CALCIUM 40 MG PO TABS
40.0000 mg | ORAL_TABLET | Freq: Every day | ORAL | Status: DC
  Administered 2019-03-13 – 2019-03-14 (×2): 40 mg via ORAL
  Filled 2019-03-13 (×2): qty 1

## 2019-03-13 MED ORDER — ORAL CARE MOUTH RINSE
15.0000 mL | Freq: Two times a day (BID) | OROMUCOSAL | Status: DC
  Administered 2019-03-13 – 2019-03-17 (×10): 15 mL via OROMUCOSAL

## 2019-03-13 MED ORDER — ONDANSETRON HCL 4 MG/2ML IJ SOLN: 4.0000 mg | Freq: Four times a day (QID) | INTRAMUSCULAR | Status: DC | PRN

## 2019-03-13 MED ORDER — ACETAMINOPHEN 650 MG RE SUPP: 650.0000 mg | Freq: Four times a day (QID) | RECTAL | Status: DC | PRN

## 2019-03-13 MED ORDER — GUAIFENESIN ER 600 MG PO TB12: 600.0000 mg | ORAL_TABLET | ORAL | Status: DC | PRN

## 2019-03-13 MED ORDER — POLYETHYLENE GLYCOL 3350 17 G PO PACK: 17.0000 g | PACK | Freq: Every day | ORAL | Status: DC | PRN

## 2019-03-13 MED ORDER — CHLORHEXIDINE GLUCONATE CLOTH 2 % EX PADS
6.0000 | MEDICATED_PAD | Freq: Every day | CUTANEOUS | Status: DC
  Administered 2019-03-13 – 2019-03-17 (×5): 6 via TOPICAL

## 2019-03-13 MED ORDER — SODIUM CHLORIDE 0.9 % IV SOLN
500.0000 mg | INTRAVENOUS | Status: DC
  Administered 2019-03-13 – 2019-03-14 (×2): 500 mg via INTRAVENOUS
  Filled 2019-03-13 (×2): qty 500

## 2019-03-13 MED ORDER — SODIUM CHLORIDE 0.9 % IV SOLN
1.0000 g | Freq: Once | INTRAVENOUS | Status: AC
  Administered 2019-03-13: 1 g via INTRAVENOUS
  Filled 2019-03-13: qty 10

## 2019-03-13 MED ORDER — SODIUM CHLORIDE 0.9% FLUSH
3.0000 mL | Freq: Two times a day (BID) | INTRAVENOUS | Status: DC
  Administered 2019-03-13 – 2019-03-17 (×9): 3 mL via INTRAVENOUS

## 2019-03-13 MED ORDER — ENOXAPARIN SODIUM 40 MG/0.4ML ~~LOC~~ SOLN
40.0000 mg | SUBCUTANEOUS | Status: DC
  Administered 2019-03-13 – 2019-03-15 (×3): 40 mg via SUBCUTANEOUS
  Filled 2019-03-13 (×3): qty 0.4

## 2019-03-13 MED ORDER — VANCOMYCIN HCL IN DEXTROSE 1-5 GM/200ML-% IV SOLN: 1000.0000 mg | INTRAVENOUS | Status: DC

## 2019-03-13 MED ORDER — SODIUM CHLORIDE 0.9 % IV SOLN
2.0000 g | Freq: Three times a day (TID) | INTRAVENOUS | Status: DC
  Filled 2019-03-13: qty 2

## 2019-03-13 MED ORDER — INSULIN ASPART 100 UNIT/ML ~~LOC~~ SOLN
0.0000 [IU] | SUBCUTANEOUS | Status: DC
  Administered 2019-03-13: 1 [IU] via SUBCUTANEOUS
  Administered 2019-03-13: 13:00:00 2 [IU] via SUBCUTANEOUS
  Administered 2019-03-13 – 2019-03-18 (×13): 1 [IU] via SUBCUTANEOUS

## 2019-03-13 MED ORDER — ONDANSETRON HCL 4 MG PO TABS: 4.0000 mg | ORAL_TABLET | Freq: Four times a day (QID) | ORAL | Status: DC | PRN

## 2019-03-13 MED ORDER — METHYLPREDNISOLONE SODIUM SUCC 125 MG IJ SOLR
60.0000 mg | Freq: Two times a day (BID) | INTRAMUSCULAR | Status: DC
  Administered 2019-03-13: 07:00:00 60 mg via INTRAVENOUS
  Filled 2019-03-13 (×2): qty 2

## 2019-03-13 MED ORDER — LEVALBUTEROL HCL 0.63 MG/3ML IN NEBU
0.6300 mg | INHALATION_SOLUTION | Freq: Four times a day (QID) | RESPIRATORY_TRACT | Status: DC | PRN
  Administered 2019-03-14: 0.63 mg via RESPIRATORY_TRACT
  Filled 2019-03-13: qty 3

## 2019-03-13 MED ORDER — PREDNISONE 20 MG PO TABS: 40.0000 mg | ORAL_TABLET | Freq: Every day | ORAL | Status: DC

## 2019-03-13 MED ORDER — SODIUM CHLORIDE 0.9 % IV SOLN
INTRAVENOUS | Status: DC
  Administered 2019-03-13: 05:00:00 via INTRAVENOUS
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
## 2019-03-13 NOTE — ED Provider Notes (Signed)
Patient received and signed out pending COVID testing.  Presumed COPD exacerbation.  Patient with respiratory acidosis on ABG.  COVID testing is negative.  Will place on BiPAP given respiratory acidosis.  She is already received steroids and albuterol.  We will plan for admission to the hospitalist.  12:51 AM Patient reports that she feels some better.  However, she appears to have persistent tachypnea and wheezing on exam.  I feel she would benefit from BiPAP.  I discussed this with the patient.  She is agreeable to plan.  Will place on BiPAP and admit to the hospitalist.  I have reviewed the rest of her testing.  She does have a leukocytosis with a left shift.  Given chest x-ray findings, will dose with Rocephin and azithromycin.  Patient to be admitted to Dr. Roel Cluck.   Merryl Hacker, MD 03/13/19 [phone]

## 2019-03-13 NOTE — Progress Notes (Signed)
  Echocardiogram 2D Echocardiogram has been performed.  Katie [name] 03/13/2019, 9:12 AM

## 2019-03-13 NOTE — ED Notes (Signed)
Report given to Danielle, RN.  

## 2019-03-13 NOTE — Progress Notes (Signed)
PCCM Interval Note  Patient on bedside exam is awake, alert and oriented. Tolerating BiPAP. Reviewed ABG which demonstrated resolution of acute hypercarbic respiratory failure. No indication of advanced airway.   PCCM will be available as needed. Please re-consult if respiratory or mental status worsens.  Rodman Pickle, M.D. [facility] Pulmonary/Critical Care Medicine Pager: [phone] After hours pager: [phone]

## 2019-03-13 NOTE — ED Notes (Signed)
ED TO INPATIENT HANDOFF REPORT  ED Nurse Name and Phone #: [place], [place] Name/Age/Gender Katie [name] 73 y.o. female Room/Bed: WA09/WA09  Code Status   Code Status: Prior  Home/SNF/Other Home Patient oriented to: self, place, time and situation Is this baseline? Yes   Triage Complete: Triage complete  Chief Complaint SOB  Triage Note Arrives via EMS from home. C/C SOB x3 hours, hx asthma, COPD. Ned treatment and inhaler used without relief. Arrives on non-rebreather 15 liters.    Allergies Allergies  Allergen Reactions  . Ace Inhibitors     Pseudoasthma/ renal failure    Level of Care/Admitting Diagnosis ED Disposition    ED Disposition Condition Comment   Admit  Hospital Area: [facility] [place] [[id number]]  Level of Care: Stepdown [14]  Admit to SDU based on following criteria: Respiratory Distress:  Frequent assessment and/or intervention to maintain adequate ventilation/respiration, pulmonary toilet, and respiratory treatment.  Covid Evaluation: N/A  Diagnosis: COPD exacerbation ([place]) [[id number]]  Admitting Physician: Toy Baker [[id number]]  Attending Physician: Toy Baker [[id number]]  Estimated length of stay: 3 - 4 days  Certification:: I certify this patient will need inpatient services for at least 2 midnights  PT Class (Do Not Modify): Inpatient [101]  PT Acc Code (Do Not Modify): Private [1]       B Medical/Surgery History Past Medical History:  Diagnosis Date  . AAA (abdominal aortic aneurysm) ([place])   . Arthritis   . Cholelithiasis 2011   s/p cholescystectomy   . COPD (chronic obstructive pulmonary disease) ([facility])    no history of PFTs, only diagnosed by CXR, not on any inhalers because she has not had a PCP in over 2 years  . COPD (chronic obstructive pulmonary disease) ([place])   . Emphysema 2010   dx'd/CXR  . GERD (gastroesophageal reflux disease)   . History of heartburn   . History of renal cell carcinoma   .  Hypercholesteremia    previously on pravastatin   . Hypertension    previously on Lisinopril/HCTZ  . Hypertension   . Murmur    no  prior work-up  . Pneumonia 1980's   "walking"  . Renal cell carcinoma 01/2010   s/p right radical nephrectomy 01/2010,  followed by [facility]  . Shortness of breath 09/26/11   "lately all the time"   Past Surgical History:  Procedure Laterality Date  . APPENDECTOMY  1970's  . CARDIAC CATHETERIZATION  09/2011  . CHOLECYSTECTOMY  01/2010  . CHOLECYSTECTOMY    . DIAGNOSTIC LAPAROSCOPY    . KIDNEY SURGERY    . LEFT AND RIGHT HEART CATHETERIZATION WITH CORONARY ANGIOGRAM N/A 09/30/2011   Procedure: LEFT AND RIGHT HEART CATHETERIZATION WITH CORONARY ANGIOGRAM;  Surgeon: Candee Furbish, MD;  Location: [facility] CATH LAB;  Service: Cardiovascular;  Laterality: N/A;  . MULTIPLE EXTRACTIONS WITH ALVEOLOPLASTY  10/06/2011   Procedure: MULTIPLE EXTRACION WITH ALVEOLOPLASTY;  Surgeon: Lenn Cal, DDS;  Location: [facility];  Service: Oral Surgery;  Laterality: N/A;  Multiple extraction of tooth #'s 1, 6, 8, 10, 11, 22, 23, 26, 27, 28, 29 with alveoloplasty and Upper right buccal exostoses reductions.  . NEPHRECTOMY RADICAL  01/2010   right   . OVARIAN CYST REMOVAL  1970's   "went through belly button"  . TEE WITHOUT CARDIOVERSION  10/01/2011   Procedure: TRANSESOPHAGEAL ECHOCARDIOGRAM (TEE);  Surgeon: [name];  Location: [facility];  Service: Cardiovascular;  Laterality: N/A;  . TUBAL LIGATION  1970's  . US  ECHOCARDIOGRAPHY  09/2011     A IV Location/Drains/Wounds Patient Lines/Drains/Airways Status   Active Line/Drains/Airways    Name:   Placement date:   Placement time:   Site:   Days:   Peripheral IV 03/08/19 Anterior;Right Forearm   03/08/19    1745    Forearm   5   Peripheral IV 03/12/19 Right Antecubital   03/12/19    2251    Antecubital   1   Incision 10/06/11 Lip Other (Comment)   10/06/11    1331     2715          Intake/Output  Last 24 hours  Intake/Output Summary (Last 24 hours) at 03/13/2019 0257 Last data filed at 03/13/2019 0201 Gross per 24 hour  Intake 150 ml  Output -  Net 150 ml    Labs/Imaging Results for orders placed or performed during the hospital encounter of 03/12/19 (from the past 48 hour(s))  Basic metabolic panel     Status: Abnormal   Collection Time: 03/12/19 10:42 PM  Result Value Ref Range   Sodium 135 135 - 145 mmol/L   Potassium 4.9 3.5 - 5.1 mmol/L   Chloride 104 98 - 111 mmol/L   CO2 23 22 - 32 mmol/L   Glucose, Bld 217 (H) 70 - 99 mg/dL   BUN 23 8 - 23 mg/dL   Creatinine, Ser 1.40 (H) 0.44 - 1.00 mg/dL   Calcium 9.3 8.9 - 10.3 mg/dL   GFR calc non Af Amer 38 (L) >60 mL/min   GFR calc Af Amer 44 (L) >60 mL/min   Anion gap 8 5 - 15    Comment: Performed at [facility], [place] [address], [place], [place] [address]  CBC with Differential     Status: Abnormal   Collection Time: 03/12/19 10:42 PM  Result Value Ref Range   WBC 13.2 (H) 4.0 - 10.5 K/uL   RBC 3.81 (L) 3.87 - 5.11 MIL/uL   Hemoglobin 11.4 (L) 12.0 - 15.0 g/dL   HCT 36.9 36.0 - 46.0 %   MCV 96.9 80.0 - 100.0 fL   MCH 29.9 26.0 - 34.0 pg   MCHC 30.9 30.0 - 36.0 g/dL   RDW 13.2 11.5 - 15.5 %   Platelets 468 (H) 150 - 400 K/uL   nRBC 0.0 0.0 - 0.2 %   Neutrophils Relative % 78 %   Neutro Abs 10.4 (H) 1.7 - 7.7 K/uL   Lymphocytes Relative 13 %   Lymphs Abs 1.7 0.7 - 4.0 K/uL   Monocytes Relative 5 %   Monocytes Absolute 0.7 0.1 - 1.0 K/uL   Eosinophils Relative 2 %   Eosinophils Absolute 0.2 0.0 - 0.5 K/uL   Basophils Relative 1 %   Basophils Absolute 0.1 0.0 - 0.1 K/uL   Immature Granulocytes 1 %   Abs Immature Granulocytes 0.11 (H) 0.00 - 0.07 K/uL    Comment: Performed at [facility], [place] [address] [place], [place] [address]  Troponin I - Once     Status: None   Collection Time: 03/12/19 10:42 PM  Result Value Ref Range   Troponin I <0.03 <0.03 ng/mL    Comment:  Performed at [facility], [place] [address] [place], [place] [address]  SARS Coronavirus 2 (CEPHEID - Performed in [facility] hospital lab), Hosp Order     Status: None   Collection Time: 03/12/19 10:42 PM  Result Value Ref Range   SARS Coronavirus 2 NEGATIVE  NEGATIVE    Comment: (NOTE) If result is NEGATIVE SARS-CoV-2 target nucleic acids are NOT DETECTED. The SARS-CoV-2 RNA is generally detectable in upper and lower  respiratory specimens during the acute phase of infection. The lowest  concentration of SARS-CoV-2 viral copies this assay can detect is 250  copies / mL. A negative result does not preclude SARS-CoV-2 infection  and should not be used as the sole basis for treatment or other  patient management decisions.  A negative result may occur with  improper specimen collection / handling, submission of specimen other  than nasopharyngeal swab, presence of viral mutation(s) within the  areas targeted by this assay, and inadequate number of viral copies  (<250 copies / mL). A negative result must be combined with clinical  observations, patient history, and epidemiological information. If result is POSITIVE SARS-CoV-2 target nucleic acids are DETECTED. The SARS-CoV-2 RNA is generally detectable in upper and lower  respiratory specimens dur ing the acute phase of infection.  Positive  results are indicative of active infection with SARS-CoV-2.  Clinical  correlation with patient history and other diagnostic information is  necessary to determine patient infection status.  Positive results do  not rule out bacterial infection or co-infection with other viruses. If result is PRESUMPTIVE POSTIVE SARS-CoV-2 nucleic acids MAY BE PRESENT.   A presumptive positive result was obtained on the submitted specimen  and confirmed on repeat testing.  While 2019 novel coronavirus  (SARS-CoV-2) nucleic acids may be present in the submitted sample  additional confirmatory  testing may be necessary for epidemiological  and / or clinical management purposes  to differentiate between  SARS-CoV-2 and other Sarbecovirus currently known to infect humans.  If clinically indicated additional testing with an alternate test  methodology [phone]) is advised. The SARS-CoV-2 RNA is generally  detectable in upper and lower respiratory sp ecimens during the acute  phase of infection. The expected result is Negative. Fact Sheet for Patients:  StrictlyIdeas.no Fact Sheet for Healthcare Providers: BankingDealers.co.za This test is not yet approved or cleared by the [place] FDA and has been authorized for detection and/or diagnosis of SARS-CoV-2 by FDA under an Emergency Use Authorization (EUA).  This EUA will remain in effect (meaning this test can be used) for the duration of the COVID-19 declaration under Section 564(b)(1) of the Act, 21 U.S.C. section 360bbb-3(b)(1), unless the authorization is terminated or revoked sooner. Performed at [facility], [place] [address] [place], [place] [address]   Brain natriuretic peptide     Status: Abnormal   Collection Time: 03/12/19 10:42 PM  Result Value Ref Range   B Natriuretic Peptide 817.2 (H) 0.0 - 100.0 pg/mL    Comment: Performed at [facility], [place] [address] [place], [place] [address]  Blood gas, venous     Status: Abnormal   Collection Time: 03/12/19 10:57 PM  Result Value Ref Range   FIO2 15.00    Delivery systems NON-REBREATHER OXYGEN MASK    pH, Ven 7.173 (LL) 7.250 - 7.430    Comment: CRITICAL RESULT CALLED TO, READ BACK BY AND VERIFIED WITH: ELLIOT WENTZ MD AT 2304 BY AMY RAY RRT RCP ON 03/12/2019    pCO2, Ven 71.1 (HH) 44.0 - 60.0 mmHg    Comment: CRITICAL RESULT CALLED TO, READ BACK BY AND VERIFIED WITH: ELLIOT WENTZ MD AT 2304 BY AMY RAY RRT,RCP ON 03/12/2019    pO2, Ven 63.4 (H) 32.0 - 45.0 mmHg   Bicarbonate 25.1  20.0 -  28.0 mmol/L   Acid-base deficit 4.7 (H) 0.0 - 2.0 mmol/L   O2 Saturation 83.5 %   Patient temperature 98.6    Collection site VEIN    Drawn by COLLECTED BY NURSE    Sample type VENOUS     Comment: Performed at [facility], [place] [address] [place], [place] [address]  Blood gas, arterial     Status: Abnormal   Collection Time: 03/13/19  2:43 AM  Result Value Ref Range   FIO2 35.00    Delivery systems BILEVEL POSITIVE AIRWAY PRESSURE    Inspiratory PAP 14.0    Expiratory PAP 5.0    pH, Arterial 7.329 (L) 7.350 - 7.450   pCO2 arterial 41.4 32.0 - 48.0 mmHg   pO2, Arterial 99.0 83.0 - 108.0 mmHg   Bicarbonate 21.4 20.0 - 28.0 mmol/L   Acid-base deficit 3.9 (H) 0.0 - 2.0 mmol/L   O2 Saturation 97.2 %   Patient temperature 97.3    Collection site RIGHT RADIAL    Drawn by [id number]    Sample type ARTERIAL DRAW    Allens test (pass/fail) PASS PASS    Comment: Performed at [place] [address] [place], [place] [address]   Dg Chest Port 1 View  Result Date: 03/12/2019 CLINICAL DATA:  73 y/o F; 3 hours of shortness of breath. History of asthma and COPD. EXAM: PORTABLE CHEST 1 VIEW COMPARISON:  09/28/2018 chest radiograph FINDINGS: Stable cardiac silhouette given projection technique. Aortic calcific atherosclerosis. Diffusely increased interstitial opacities of the lungs. No focal consolidation. Possible small effusions. No pneumothorax. Mild S-shaped curvature of the spine. IMPRESSION: Diffusely increased interstitial opacities likely representing interstitial edema. Suspected small effusions. Electronically Signed   By: Kristine Garbe M.D.   On: 03/12/2019 22:40    Pending Labs Unresulted Labs (From admission, onward)    Start     Ordered   03/13/19 0200  Troponin I - Now Then Q6H  Now then every 6 hours,   R     03/13/19 0159   03/13/19 0054  Blood culture (routine x 2)  BLOOD CULTURE X 2,   STAT     03/13/19 0053   Signed  and Held  Magnesium  Add-on,   R     Signed and Held   Signed and Held  HIV antibody  Tomorrow morning,   R     Signed and Held   Signed and Held  Respiratory Panel by PCR  (Respiratory virus panel with precautions)  Once,   R    Question:  Patient immune status  Answer:  Normal   Signed and Held   Signed and Held  Urinalysis, Routine w reflex microscopic  Once,   R     Signed and Held   Signed and Held  Culture, blood (routine x 2) Call MD if unable to obtain prior to antibiotics being given  BLOOD CULTURE X 2,   R    Comments:  If blood cultures drawn in Emergency Department - Do not draw and cancel order   Question:  Patient immune status  Answer:  Normal   Signed and Held   Signed and Held  Culture, sputum-assessment  Once,   R    Question:  Patient immune status  Answer:  Normal   Signed and Held   Signed and Held  Comprehensive metabolic panel  Add-on,   R     Signed and Held   Signed and Held  Gram  stain  Once,   R     Signed and Held   Signed and Held  Strep pneumoniae urinary antigen  Once,   R     Signed and Held   Signed and Held  Hemoglobin A1c  Once,   R    Comments:  To assess prior glycemic control    Signed and Held   Signed and Held  Magnesium  Tomorrow morning,   R    Comments:  Call MD if <1.5    Signed and Held   Signed and Held  Phosphorus  Tomorrow morning,   R     Signed and Held   Signed and Held  TSH  Once,   R    Comments:  Cancel if already done within 1 month and notify MD    Signed and Held   Signed and Held  Comprehensive metabolic panel  Once,   R    Comments:  Cal MD for K<3.5 or >5.0    Signed and Held   Signed and Held  CBC  Once,   R    Comments:  Call for hg <8.0    Signed and Held          Vitals/Pain Today's Vitals   03/13/19 0125 03/13/19 0137 03/13/19 0230 03/13/19 0245  BP:  (!) 117/50 93/65 112/79  Pulse: 86 79 82 88  Resp: (!) 27 (!) 28 (!) 23 (!) 35  Temp:      TempSrc:      SpO2: 99% 100% 100% 100%  Weight:       Height:        Isolation Precautions No active isolations  Medications Medications  0.9 %  sodium chloride infusion ( Intravenous New Bag/Given 03/12/19 2254)  azithromycin (ZITHROMAX) 500 mg in sodium chloride 0.9 % 250 mL IVPB (500 mg Intravenous New Bag/Given 03/13/19 0204)  magnesium sulfate IVPB 2 g 50 mL (0 g Intravenous Stopped 03/13/19 0040)  albuterol (VENTOLIN HFA) 108 (90 Base) MCG/ACT inhaler 8 puff (8 puffs Inhalation Given 03/12/19 2254)  methylPREDNISolone sodium succinate (SOLU-MEDROL) 125 mg/2 mL injection 125 mg (125 mg Intravenous Given 03/12/19 2253)  cefTRIAXone (ROCEPHIN) 1 g in sodium chloride 0.9 % 100 mL IVPB (0 g Intravenous Stopped 03/13/19 0201)  LORazepam (ATIVAN) injection 1 mg (1 mg Intravenous Given 03/13/19 0117)    Mobility walks Moderate fall risk

## 2019-03-13 NOTE — ED Notes (Signed)
One set of blood cultures obtained before abx started.
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
## 2019-03-13 NOTE — Progress Notes (Signed)
Pharmacy Antibiotic Note  Katie [name] is a 73 y.o. female admitted on 03/12/2019 with pneumonia.  Pharmacy has been consulted for Vancomycin, cefepime dosing.  Plan: Vancomycin 1500mg  iv x1,then  Vancomycin 1000 mg IV Q 36 hrs. Goal AUC 400-550. Expected AUC: 491 SCr used: 1.4  Cefepime 2gm iv q12hr   Height: 5\' 1"  (154.9 cm) Weight: 151 lb 3.8 oz (68.6 kg) IBW/kg (Calculated) : 47.8  Temp (24hrs), Avg:97.5 F (36.4 C), Min:97.3 F (36.3 C), Max:97.8 F (36.6 C)  Recent Labs  Lab 03/06/19 1800 03/07/19 0706 03/08/19 0329 03/09/19 0337 03/10/19 0229 03/12/19 2242  WBC 12.3* 5.8 5.7  --   --  13.2*  CREATININE 1.60* 1.39* 1.43* 1.26* 1.39* 1.40*  LATICACIDVEN 1.8  --   --   --   --   --     Estimated Creatinine Clearance: 32.6 mL/min (A) (by C-G formula based on SCr of 1.4 mg/dL (H)).    Allergies  Allergen Reactions  . Ace Inhibitors     Pseudoasthma/ renal failure    Antimicrobials this admission: Vancomycin 03/13/2019 >> Cefepime 03/13/2019 >>   Dose adjustments this admission: -  Microbiology results: -  Thank you for allowing pharmacy to be a part of this patient's care.  Nani Skillern Crowford 03/13/2019 4:14 AM
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
## 2019-03-13 NOTE — ED Notes (Signed)
Respiratory called for BiPAP.     
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
## 2019-03-13 NOTE — Progress Notes (Signed)
BIPAP on hold at this time due to patient having anxiety. Meds given.ABG will be done 1 hour after patient is placed back on BIPAP
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
## 2019-03-14 ENCOUNTER — Inpatient Hospital Stay (HOSPITAL_COMMUNITY): Payer: Medicare Other

## 2019-03-14 LAB — HIV ANTIBODY (ROUTINE TESTING W REFLEX): HIV Screen 4th Generation wRfx: NONREACTIVE

## 2019-03-14 LAB — CBC
HCT: 31.4 % — ABNORMAL LOW (ref 36.0–46.0)
Hemoglobin: 9.3 g/dL — ABNORMAL LOW (ref 12.0–15.0)
MCH: 30.4 pg (ref 26.0–34.0)
MCHC: 29.6 g/dL — ABNORMAL LOW (ref 30.0–36.0)
MCV: 102.6 fL — ABNORMAL HIGH (ref 80.0–100.0)
Platelets: 309 10*3/uL (ref 150–400)
RBC: 3.06 MIL/uL — ABNORMAL LOW (ref 3.87–5.11)
RDW: 13.2 % (ref 11.5–15.5)
WBC: 9.2 10*3/uL (ref 4.0–10.5)
nRBC: 0 % (ref 0.0–0.2)

## 2019-03-14 LAB — GLUCOSE, CAPILLARY
Glucose-Capillary: 123 mg/dL — ABNORMAL HIGH (ref 70–99)
Glucose-Capillary: 116 mg/dL — ABNORMAL HIGH (ref 70–99)
Glucose-Capillary: 146 mg/dL — ABNORMAL HIGH (ref 70–99)
Glucose-Capillary: 137 mg/dL — ABNORMAL HIGH (ref 70–99)
Glucose-Capillary: 112 mg/dL — ABNORMAL HIGH (ref 70–99)

## 2019-03-14 LAB — BASIC METABOLIC PANEL
Anion gap: 9 (ref 5–15)
BUN: 27 mg/dL — ABNORMAL HIGH (ref 8–23)
CO2: 22 mmol/L (ref 22–32)
Calcium: 9.4 mg/dL (ref 8.9–10.3)
Chloride: 110 mmol/L (ref 98–111)
Creatinine, Ser: 1.31 mg/dL — ABNORMAL HIGH (ref 0.44–1.00)
GFR calc Af Amer: 47 mL/min — ABNORMAL LOW (ref >60)
GFR calc non Af Amer: 41 mL/min — ABNORMAL LOW (ref >60)
Glucose, Bld: 133 mg/dL — ABNORMAL HIGH (ref 70–99)
Potassium: 4.6 mmol/L (ref 3.5–5.1)
Sodium: 141 mmol/L (ref 135–145)

## 2019-03-14 MED ORDER — METOPROLOL TARTRATE 25 MG PO TABS
25.0000 mg | ORAL_TABLET | Freq: Two times a day (BID) | ORAL | Status: DC
  Administered 2019-03-14 – 2019-03-18 (×8): 25 mg via ORAL
  Filled 2019-03-14 (×9): qty 1

## 2019-03-14 MED ORDER — AZITHROMYCIN 250 MG PO TABS
250.0000 mg | ORAL_TABLET | Freq: Once | ORAL | Status: AC
  Administered 2019-03-14: 23:00:00 250 mg via ORAL
  Filled 2019-03-14: qty 1

## 2019-03-14 MED ORDER — IPRATROPIUM-ALBUTEROL 0.5-2.5 (3) MG/3ML IN SOLN
3.0000 mL | Freq: Four times a day (QID) | RESPIRATORY_TRACT | Status: DC
  Administered 2019-03-14 – 2019-03-15 (×4): 3 mL via RESPIRATORY_TRACT
  Filled 2019-03-14 (×4): qty 3

## 2019-03-14 MED ORDER — METHYLPREDNISOLONE SODIUM SUCC 125 MG IJ SOLR
60.0000 mg | Freq: Three times a day (TID) | INTRAMUSCULAR | Status: DC
  Administered 2019-03-14 – 2019-03-18 (×14): 60 mg via INTRAVENOUS
  Filled 2019-03-14 (×14): qty 2

## 2019-03-14 MED ORDER — FUROSEMIDE 10 MG/ML IJ SOLN
40.0000 mg | Freq: Two times a day (BID) | INTRAMUSCULAR | Status: DC
  Administered 2019-03-14 – 2019-03-16 (×4): 40 mg via INTRAVENOUS
  Filled 2019-03-14 (×4): qty 4

## 2019-03-14 MED ORDER — FUROSEMIDE 10 MG/ML IJ SOLN
40.0000 mg | Freq: Once | INTRAMUSCULAR | Status: AC
  Administered 2019-03-14: 40 mg via INTRAVENOUS
  Filled 2019-03-14: qty 4
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
## 2019-03-15 ENCOUNTER — Telehealth: Payer: Self-pay | Admitting: Physician Assistant

## 2019-03-15 DIAGNOSIS — J441 Chronic obstructive pulmonary disease with (acute) exacerbation: Secondary | ICD-10-CM

## 2019-03-15 DIAGNOSIS — I340 Nonrheumatic mitral (valve) insufficiency: Secondary | ICD-10-CM

## 2019-03-15 DIAGNOSIS — I5031 Acute diastolic (congestive) heart failure: Secondary | ICD-10-CM

## 2019-03-15 DIAGNOSIS — I341 Nonrheumatic mitral (valve) prolapse: Secondary | ICD-10-CM

## 2019-03-15 LAB — GLUCOSE, CAPILLARY
Glucose-Capillary: 121 mg/dL — ABNORMAL HIGH (ref 70–99)
Glucose-Capillary: 112 mg/dL — ABNORMAL HIGH (ref 70–99)
Glucose-Capillary: 121 mg/dL — ABNORMAL HIGH (ref 70–99)
Glucose-Capillary: 126 mg/dL — ABNORMAL HIGH (ref 70–99)
Glucose-Capillary: 119 mg/dL — ABNORMAL HIGH (ref 70–99)
Glucose-Capillary: 141 mg/dL — ABNORMAL HIGH (ref 70–99)

## 2019-03-15 LAB — BASIC METABOLIC PANEL
Anion gap: 12 (ref 5–15)
BUN: 39 mg/dL — ABNORMAL HIGH (ref 8–23)
CO2: 22 mmol/L (ref 22–32)
Calcium: 9.5 mg/dL (ref 8.9–10.3)
Chloride: 104 mmol/L (ref 98–111)
Creatinine, Ser: 1.56 mg/dL — ABNORMAL HIGH (ref 0.44–1.00)
GFR calc Af Amer: 38 mL/min — ABNORMAL LOW (ref >60)
GFR calc non Af Amer: 33 mL/min — ABNORMAL LOW (ref >60)
Glucose, Bld: 131 mg/dL — ABNORMAL HIGH (ref 70–99)
Potassium: 3.6 mmol/L (ref 3.5–5.1)
Sodium: 138 mmol/L (ref 135–145)

## 2019-03-15 LAB — CBC
HCT: 29.4 % — ABNORMAL LOW (ref 36.0–46.0)
Hemoglobin: 9 g/dL — ABNORMAL LOW (ref 12.0–15.0)
MCH: 29.3 pg (ref 26.0–34.0)
MCHC: 30.6 g/dL (ref 30.0–36.0)
MCV: 95.8 fL (ref 80.0–100.0)
Platelets: 376 10*3/uL (ref 150–400)
RBC: 3.07 MIL/uL — ABNORMAL LOW (ref 3.87–5.11)
RDW: 13.3 % (ref 11.5–15.5)
WBC: 9.5 10*3/uL (ref 4.0–10.5)
nRBC: 0 % (ref 0.0–0.2)

## 2019-03-15 LAB — HEMOGLOBIN A1C
Hgb A1c MFr Bld: 5.4 % (ref 4.8–5.6)
Mean Plasma Glucose: 108 mg/dL

## 2019-03-15 MED ORDER — ATORVASTATIN CALCIUM 40 MG PO TABS
80.0000 mg | ORAL_TABLET | Freq: Every day | ORAL | Status: DC
  Administered 2019-03-15 – 2019-03-17 (×3): 80 mg via ORAL
  Filled 2019-03-15 (×3): qty 2

## 2019-03-15 MED ORDER — AZITHROMYCIN 250 MG PO TABS
500.0000 mg | ORAL_TABLET | Freq: Every day | ORAL | Status: DC
  Administered 2019-03-15 – 2019-03-18 (×4): 500 mg via ORAL
  Filled 2019-03-15 (×4): qty 2

## 2019-03-15 MED ORDER — ENOXAPARIN SODIUM 30 MG/0.3ML ~~LOC~~ SOLN
30.0000 mg | SUBCUTANEOUS | Status: DC
  Administered 2019-03-16 – 2019-03-17 (×2): 30 mg via SUBCUTANEOUS
  Filled 2019-03-15 (×3): qty 0.3

## 2019-03-15 MED ORDER — ALBUTEROL SULFATE (2.5 MG/3ML) 0.083% IN NEBU: 2.5000 mg | INHALATION_SOLUTION | RESPIRATORY_TRACT | Status: DC | PRN

## 2019-03-15 MED ORDER — IPRATROPIUM-ALBUTEROL 0.5-2.5 (3) MG/3ML IN SOLN
3.0000 mL | Freq: Three times a day (TID) | RESPIRATORY_TRACT | Status: DC
  Administered 2019-03-15 – 2019-03-18 (×10): 3 mL via RESPIRATORY_TRACT
  Filled 2019-03-15 (×11): qty 3
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
## 2019-03-15 NOTE — Telephone Encounter (Signed)
Ok to give verbal orders. Dr. Annye Asa is supervising MD. Patient is currently hospitalized. Please make sure HH is aware of this.
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
## 2019-03-15 NOTE — Telephone Encounter (Signed)
Spoke with Melissa from [facility]. Gave verbal orders OK for foot care, Katie [name] is supervising MD, and informed them that patient is currently inpatient at [facility].
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
## 2019-03-15 NOTE — Telephone Encounter (Signed)
Melissa from [facility] called in asking for order to add nursing for pt. She states that pt has had some blisters on her foot bust and she would like to treat the area. She also needs to know that Birdie Riddle is the supervising provider. Please call [phone]

## 2019-03-16 DIAGNOSIS — N183 Chronic kidney disease, stage 3 (moderate): Secondary | ICD-10-CM

## 2019-03-16 DIAGNOSIS — I10 Essential (primary) hypertension: Secondary | ICD-10-CM

## 2019-03-16 LAB — BASIC METABOLIC PANEL
Anion gap: 10 (ref 5–15)
BUN: 51 mg/dL — ABNORMAL HIGH (ref 8–23)
CO2: 26 mmol/L (ref 22–32)
Calcium: 9.1 mg/dL (ref 8.9–10.3)
Chloride: 102 mmol/L (ref 98–111)
Creatinine, Ser: 1.65 mg/dL — ABNORMAL HIGH (ref 0.44–1.00)
GFR calc Af Amer: 36 mL/min — ABNORMAL LOW (ref >60)
GFR calc non Af Amer: 31 mL/min — ABNORMAL LOW (ref >60)
Glucose, Bld: 121 mg/dL — ABNORMAL HIGH (ref 70–99)
Potassium: 3.5 mmol/L (ref 3.5–5.1)
Sodium: 138 mmol/L (ref 135–145)

## 2019-03-16 LAB — EXPECTORATED SPUTUM ASSESSMENT W GRAM STAIN, RFLX TO RESP C

## 2019-03-16 LAB — GLUCOSE, CAPILLARY
Glucose-Capillary: 116 mg/dL — ABNORMAL HIGH (ref 70–99)
Glucose-Capillary: 117 mg/dL — ABNORMAL HIGH (ref 70–99)
Glucose-Capillary: 119 mg/dL — ABNORMAL HIGH (ref 70–99)
Glucose-Capillary: 128 mg/dL — ABNORMAL HIGH (ref 70–99)
Glucose-Capillary: 129 mg/dL — ABNORMAL HIGH (ref 70–99)
Glucose-Capillary: 140 mg/dL — ABNORMAL HIGH (ref 70–99)

## 2019-03-16 LAB — CBC
HCT: 28.5 % — ABNORMAL LOW (ref 36.0–46.0)
Hemoglobin: 8.9 g/dL — ABNORMAL LOW (ref 12.0–15.0)
MCH: 29.6 pg (ref 26.0–34.0)
MCHC: 31.2 g/dL (ref 30.0–36.0)
MCV: 94.7 fL (ref 80.0–100.0)
Platelets: 359 10*3/uL (ref 150–400)
RBC: 3.01 MIL/uL — ABNORMAL LOW (ref 3.87–5.11)
RDW: 13.2 % (ref 11.5–15.5)
WBC: 8.6 10*3/uL (ref 4.0–10.5)
nRBC: 0 % (ref 0.0–0.2)

## 2019-03-16 MED ORDER — FUROSEMIDE 40 MG PO TABS
40.0000 mg | ORAL_TABLET | Freq: Every day | ORAL | Status: DC
  Administered 2019-03-17 – 2019-03-18 (×2): 40 mg via ORAL
  Filled 2019-03-16 (×2): qty 1

## 2019-03-16 MED ORDER — GUAIFENESIN ER 600 MG PO TB12
600.0000 mg | ORAL_TABLET | Freq: Two times a day (BID) | ORAL | Status: DC
  Administered 2019-03-16 – 2019-03-18 (×5): 600 mg via ORAL
  Filled 2019-03-16 (×5): qty 1
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
## 2019-03-16 NOTE — Care Management Important Message (Signed)
Important Message  Patient Details IM Letter given to Nancy Marus to present to the Patient Name: Katie [name] MRN: [id number] Date of Birth: May 10, 1946   Medicare Important Message Given:  Yes    Kerin Salen 03/16/2019, 11:53 AM

## 2019-03-16 NOTE — Progress Notes (Signed)
Progress Note  Patient Name: Katie [name] Date of Encounter: 03/16/2019  Primary Cardiologist: Skeet Latch, MD   Subjective   Concerned as she has coughed up some thick tan phlegm. Feels like there is more phlegm but she can't bring it up. Wasn't asking for mucinex. BP has been lower than her normal, she was worried about this. Had a speaker phone conversation with the patient and her daughter at the patient's request to discuss plans, TEE.  Inpatient Medications    Scheduled Meds: . atorvastatin  80 mg Oral q1800  . azithromycin  500 mg Oral Daily  . Chlorhexidine Gluconate Cloth  6 each Topical Daily  . enoxaparin (LOVENOX) injection  30 mg Subcutaneous Q24H  . furosemide  40 mg Intravenous BID  . insulin aspart  0-9 Units Subcutaneous Q4H  . ipratropium-albuterol  3 mL Nebulization TID  . mouth rinse  15 mL Mouth Rinse BID  . methylPREDNISolone (SOLU-MEDROL) injection  60 mg Intravenous Q8H  . metoprolol tartrate  25 mg Oral BID  . mometasone-formoterol  1 puff Inhalation BID  . nicotine  21 mg Transdermal Daily  . senna  1 tablet Oral BID  . sodium chloride flush  3 mL Intravenous Q12H   Continuous Infusions:  PRN Meds: acetaminophen **OR** acetaminophen, albuterol, guaiFENesin, HYDROcodone-acetaminophen, ondansetron **OR** ondansetron (ZOFRAN) IV, polyethylene glycol   Vital Signs    Vitals:   03/15/19 2035 03/15/19 2110 03/16/19 0416 03/16/19 0732  BP: 106/70  102/72   Pulse: 88  77   Resp: 18  16   Temp: 98.3 F (36.8 C)  98.3 F (36.8 C)   TempSrc: Oral  Oral   SpO2: 98% 99% 94% 94%  Weight:   64.8 kg   Height:        Intake/Output Summary (Last 24 hours) at 03/16/2019 0927 Last data filed at 03/16/2019 0426 Gross per 24 hour  Intake 240 ml  Output 1625 ml  Net -1385 ml   Filed Weights   03/13/19 0405 03/15/19 1123 03/16/19 0416  Weight: 68.6 kg 65.8 kg 64.8 kg    Telemetry    NSR - Personally Reviewed  Physical Exam   Physical exam  per MD: GEN: No acute distress.   Neck: No JVD Cardiac: RRR, no rubs, or gallops. 3/6 HSM. Respiratory: Lungs with end expiratory wheezing, faint crackles at bases, improved from yesterday GI: NABS, Soft, nontender, non-distended  MS: No edema; No deformity. Neuro:  Nonfocal, moving all extremities spontaneously Psych: Normal affect   Labs    Chemistry Recent Labs  Lab 03/13/19 0654 03/14/19 0331 03/15/19 0602 03/16/19 0528  NA 136 141 138 138  K 4.7 4.6 3.6 3.5  CL 109 110 104 102  CO2 19* 22 22 26   GLUCOSE 129* 133* 131* 121*  BUN 21 27* 39* 51*  CREATININE 1.19* 1.31* 1.56* 1.65*  CALCIUM 8.5* 9.4 9.5 9.1  PROT 6.4*  --   --   --   ALBUMIN 2.9*  --   --   --   AST 35  --   --   --   ALT 30  --   --   --   ALKPHOS 75  --   --   --   BILITOT 0.8  --   --   --   GFRNONAA 46* 41* 33* 31*  GFRAA 53* 47* 38* 36*  ANIONGAP 8 9 12 10      Hematology Recent Labs  Lab 03/14/19 0331 03/15/19 0602 03/16/19  0528  WBC 9.2 9.5 8.6  RBC 3.06* 3.07* 3.01*  HGB 9.3* 9.0* 8.9*  HCT 31.4* 29.4* 28.5*  MCV 102.6* 95.8 94.7  MCH 30.4 29.3 29.6  MCHC 29.6* 30.6 31.2  RDW 13.2 13.3 13.2  PLT 309 376 359    Cardiac Enzymes Recent Labs  Lab 03/12/19 2242 03/13/19 0654  TROPONINI <0.03 <0.03   No results for input(s): TROPIPOC in the last 168 hours.   BNP Recent Labs  Lab 03/12/19 2242  BNP 817.2*     DDimer No results for input(s): DDIMER in the last 168 hours.   Radiology    No results found.  Cardiac Studies   Echocardiogram 03/13/2019: 1. The left ventricle has normal systolic function with an ejection fraction of 60-65%. The cavity size was normal. There is mildly increased left ventricular wall thickness. Left ventricular diastolic Doppler parameters are consistent with  pseudonormalization. Elevated mean left atrial pressure. 2. The right ventricle has normal systolic function. The cavity was normal. 3. The mitral valve is abnormal. Mild thickening of  the mitral valve leaflet. Mitral valve regurgitation is moderate to severe by color flow Doppler. The MR jet is anteriorly-directed. 4. The tricuspid valve is grossly normal. 5. The inferior vena cava was dilated in size with >50% respiratory variability. 6. Normal LV systolic function; mild LVH; moderate diastolic dysfunction; thickened MV with prolapse of posterior MV leaflet and probable moderate to severe MR (difficult to quantitate due to eccentric nature of jet); suggest TEE to further assess. 7. Severe mitral valve prolapse.  Cardiac cath 2012: Cardiac cath  Abdominal aortogram- small saccular aneurysm seen in the distal aorta. Widely patent iliac vessels.  IMPRESSIONS:  1. No angiographically significant coronary artery disease. 2. Normal left ventricular systolic function. LVEDP 14 mmHg. Ejection fraction 65-70 %. 3. Normal right-sided heart pressures. 4. Normal cardiac output. 5. No significant mitral regurgitation present, no significant V wave on pulmonary capillary wedge pressure   TEE 2012:  -EF 60% to 65%. - Mitral valve: Mildly thickened anterior leaflet. Severely thickened, myxomatous posterior leaflet with significant prolapse. Moderate to severe regurgitation directed eccentrically, mostly toward the septum. There are multiple MR jets noted  Patient Profile     73 y.o. female with a PMH of severe mitral valve prolapse, HCM with SAM carotid artery disease, HTN, HLD, COPD, RCC s/p R nephrectomy in 2011, CKD stage 3, and tobacco abuse, who is being followed by cardiology for MR and acute diastolic CHF.  Assessment & Plan    1. Severe MVP with moderate-severe MR: noted on echo this admission. Appears to have been evaluated by CT Surgery in 2012 for the same issue, though was determined to not need surgery at that time and was subsequently lost to follow-up - TEE in 2012 reported above. Her prior cath in 2012 did not show any obstructive CAD, but it has been 8 years  since this evaluation.  - Anticipate updating TEE once pulmonary status improves, likely 5/29 - If severe MR confirmed on TEE, anticipate [facility] likely outpatient to evaluate for CAD prior to CT Surgery evaluation. She was seen by Dr. Roxy Manns for this in 2012; variation in murmur/MR thought to be related to [facility] and recommended watchful waiting.  - she remains on metoprolol to keep heart rate controlled, will need to monitor given her COPD but appears to be tolerating. - she had pulmonary edema on presentation, and this is resolving well. Still with slight crackles. Will change to oral furosemide for tomorrow. Avoid  dehydration given prior SAM on distant echo.  2. Acute respiratory failure: likely multifactorial in the setting of COPD exacerbation and possible acute diastolic CHF. Initially required BiPAP with improvement in O2 sats on O2 via [place] at this time. She has been diuresing with IV lasix for possible CHF with UOP -1.5L in the past 24 hours and -1.2L this admission. Cr up slightly from 1.56>1.65 today - suspect she is nearing dry weight. She is receiving antibiotics/nebuliers/steroids for COPD exacerbation. Still with faint crackles and wheezing on exam today.  - Transition to PO lasix - changed mucinex to standing order instead of PRN for now.  3. HTN: BP overall stable. Home losartan and HCTZ on hold given intermittently soft BP's and mild AKI - Continue metoprolol   4. HLD: LDL 115 09/2018, atorvastatin increased this admission - Continue statin  5. Carotid artery disease: 1-39% on carotid duplex 05/2017.  - Continue aspirin and statin  6. CKD stage 3: Hx RCC s/p right nephrectomy. Labile Cr over the past couple years - baseline 1.2-1.5. Cr 1.65 today. - Continue to monitor Cr closely with diuresis  7. Anemia: Hgb 11.4 on admission > 8.9 today; baseline 12 03/06/2019.  - denies gross blood loss - Continue management per primary team.   TIME SPENT WITH PATIENT: >35 minutes of direct  patient care. More than 50% of that time was spent on coordination of care and counseling regarding workup and management, three way conversation with patient and her daughter to explain the plan and next steps.  Buford Dresser, MD, PhD [facility]   For questions or updates, please contact [place] Please consult [email or web address] for contact info under Cardiology/STEMI.   Note prepared by: Abigail Butts, PA-C  03/16/2019, 9:27 AM   [phone]  Note edited and signed by: Buford Dresser, MD, PhD [facility]  [address] [place], [place] [address] [phone]

## 2019-03-17 LAB — GLUCOSE, CAPILLARY
Glucose-Capillary: 108 mg/dL — ABNORMAL HIGH (ref 70–99)
Glucose-Capillary: 113 mg/dL — ABNORMAL HIGH (ref 70–99)
Glucose-Capillary: 97 mg/dL (ref 70–99)
Glucose-Capillary: 108 mg/dL — ABNORMAL HIGH (ref 70–99)

## 2019-03-17 MED ORDER — SALINE SPRAY 0.65 % NA SOLN
1.0000 | NASAL | Status: DC | PRN
  Filled 2019-03-17: qty 44

## 2019-03-17 NOTE — Progress Notes (Signed)
Progress Note  Patient Name: Katie [name] Date of Encounter: 03/17/2019  Primary Cardiologist: Skeet Latch, MD   Subjective   Patient reports improvement in breathing. Denies chest pain. Anticipating TEE tomorrow.   Inpatient Medications    Scheduled Meds: . atorvastatin  80 mg Oral q1800  . azithromycin  500 mg Oral Daily  . Chlorhexidine Gluconate Cloth  6 each Topical Daily  . enoxaparin (LOVENOX) injection  30 mg Subcutaneous Q24H  . furosemide  40 mg Oral Daily  . guaiFENesin  600 mg Oral Q12H  . insulin aspart  0-9 Units Subcutaneous Q4H  . ipratropium-albuterol  3 mL Nebulization TID  . mouth rinse  15 mL Mouth Rinse BID  . methylPREDNISolone (SOLU-MEDROL) injection  60 mg Intravenous Q8H  . metoprolol tartrate  25 mg Oral BID  . mometasone-formoterol  1 puff Inhalation BID  . nicotine  21 mg Transdermal Daily  . senna  1 tablet Oral BID  . sodium chloride flush  3 mL Intravenous Q12H   Continuous Infusions:  PRN Meds: acetaminophen **OR** acetaminophen, albuterol, HYDROcodone-acetaminophen, ondansetron **OR** ondansetron (ZOFRAN) IV, polyethylene glycol   Vital Signs    Vitals:   03/16/19 1927 03/16/19 2012 03/17/19 0434 03/17/19 0724  BP:  112/70 108/72   Pulse:  89 82   Resp:  18 18   Temp:  98.4 F (36.9 C) 98.5 F (36.9 C)   TempSrc:  Oral Oral   SpO2: 95% 94% 95% 99%  Weight:   65.1 kg   Height:        Intake/Output Summary (Last 24 hours) at 03/17/2019 0845 Last data filed at 03/17/2019 0253 Gross per 24 hour  Intake 240 ml  Output 1050 ml  Net -810 ml   Filed Weights   03/15/19 1123 03/16/19 0416 03/17/19 0434  Weight: 65.8 kg 64.8 kg 65.1 kg    Telemetry    NSR - Personally Reviewed  Physical Exam   GEN: Sitting in bedside chair in no acute distress.  Neck: No JVD, no carotid bruits Cardiac: RRR, +murmur, no rubs or gallops.  Respiratory: Still with some expiratory wheeze and mild crackles at lung base but overall  improved GI: NABS, Soft, nontender, non-distended  MS: No edema; LLE in CAM boot. Neuro:  Nonfocal, moving all extremities spontaneously Psych: Normal affect   Labs    Chemistry Recent Labs  Lab 03/13/19 0654 03/14/19 0331 03/15/19 0602 03/16/19 0528  NA 136 141 138 138  K 4.7 4.6 3.6 3.5  CL 109 110 104 102  CO2 19* 22 22 26   GLUCOSE 129* 133* 131* 121*  BUN 21 27* 39* 51*  CREATININE 1.19* 1.31* 1.56* 1.65*  CALCIUM 8.5* 9.4 9.5 9.1  PROT 6.4*  --   --   --   ALBUMIN 2.9*  --   --   --   AST 35  --   --   --   ALT 30  --   --   --   ALKPHOS 75  --   --   --   BILITOT 0.8  --   --   --   GFRNONAA 46* 41* 33* 31*  GFRAA 53* 47* 38* 36*  ANIONGAP 8 9 12 10      Hematology Recent Labs  Lab 03/14/19 0331 03/15/19 0602 03/16/19 0528  WBC 9.2 9.5 8.6  RBC 3.06* 3.07* 3.01*  HGB 9.3* 9.0* 8.9*  HCT 31.4* 29.4* 28.5*  MCV 102.6* 95.8 94.7  MCH 30.4 29.3 29.6  MCHC 29.6* 30.6 31.2  RDW 13.2 13.3 13.2  PLT 309 376 359    Cardiac Enzymes Recent Labs  Lab 03/12/19 2242 03/13/19 0654  TROPONINI <0.03 <0.03   No results for input(s): TROPIPOC in the last 168 hours.   BNP Recent Labs  Lab 03/12/19 2242  BNP 817.2*     DDimer No results for input(s): DDIMER in the last 168 hours.   Radiology    No results found.  Cardiac Studies   Echocardiogram 03/13/2019: 1. The left ventricle has normal systolic function with an ejection fraction of 60-65%. The cavity size was normal. There is mildly increased left ventricular wall thickness. Left ventricular diastolic Doppler parameters are consistent with  pseudonormalization. Elevated mean left atrial pressure. 2. The right ventricle has normal systolic function. The cavity was normal. 3. The mitral valve is abnormal. Mild thickening of the mitral valve leaflet. Mitral valve regurgitation is moderate to severe by color flow Doppler. The MR jet is anteriorly-directed. 4. The tricuspid valve is grossly  normal. 5. The inferior vena cava was dilated in size with >50% respiratory variability. 6. Normal LV systolic function; mild LVH; moderate diastolic dysfunction; thickened MV with prolapse of posterior MV leaflet and probable moderate to severe MR (difficult to quantitate due to eccentric nature of jet); suggest TEE to further assess. 7. Severe mitral valve prolapse.  Cardiac cath 2012: Abdominal aortogram- small saccular aneurysm seen in the distal aorta. Widely patent iliac vessels.  IMPRESSIONS: 1. No angiographically significant coronary artery disease. 2. Normal left ventricular systolic function. LVEDP 14 mmHg. Ejection fraction 65-70 %. 3. Normal right-sided heart pressures. 4. Normal cardiac output. 5. No significant mitral regurgitation present, no significant V wave on pulmonary capillary wedge pressure   TEE 2012:  -EF 60% to 65%. - Mitral valve: Mildly thickened anterior leaflet. Severely thickened, myxomatous posterior leaflet with significant prolapse. Moderate to severe regurgitation directed eccentrically, mostly toward the septum. There are multiple MR jets noted  Patient Profile     73 y.o.femalewith a PMH of severe mitral valve prolapse, HCM with SAM carotid artery disease, HTN, HLD, COPD, RCC s/p R nephrectomy in 2011, CKD stage 3, and tobacco abuse, who is being followed by cardiology for MR and acute diastolic CHF.  Assessment & Plan    1. Severe MVP with moderate-severe MR: noted on echo this admission. TEE in 2012 reported above. Her prior cath in 2012 did not show any obstructive CAD, but it has been 8 years since this evaluation.  - Plan for TEE 03/18/2019 with Dr. Harrington Challenger if pulmonary status remains stable - If severe MR confirmed on TEE, anticipate [facility] likely outpatient to evaluate for CAD prior to CT Surgery evaluation. She was seen by Dr. Roxy Manns for this in 2012; variation in murmur/MR thought to be related to [facility] and recommended watchful waiting.  -  Continue metoprolol to keep heart rate controlled. Will need to monitor given her COPD but appears to be tolerating. - Continue oral lasix. Avoid dehydration given prior SAM on distant echo.  2. Acute respiratory failure: likely multifactorial in the setting of COPD exacerbation and possible acute diastolic CHF. Initially required BiPAP with improvement in O2 sats on O2 via [place] at this time. She has been diuresing with IV lasix for possible CHF with UOP -852mL in the past 24 hours and -2.0L this admission. Cr up slightly from 1.56>1.65 yesterday - suspect she is nearing dry weight. She is receiving antibiotics/nebuliers/steroids for COPD exacerbation. Still with faint crackles and  wheezing on exam today.  - Continue po lasix - Continue antibiotics and supportive care per primary team  3. HTN:BP overall stable. Home losartan and HCTZ on hold given intermittently soft BP's and mild AKI - Continue metoprolol   4. HLD:LDL 115 09/2018, atorvastatin increased this admission - Continue statin  5. Carotid artery disease: 1-39% on carotid duplex 05/2017.  - Continue aspirin and statin  6. CKD stage 3:Hx RCC s/p right nephrectomy. Labile Cr over the past couple years - baseline 1.2-1.5. Cr 1.65 today. - Continue to monitor Cr closely with diuresis  7. Anemia:Hgb 11.4 on admission >8.9 today; baseline 12 03/06/2019.  - denies gross blood loss - Continue management per primary team  For questions or updates, please contact [place] Please consult [email or web address] for contact info under Cardiology/STEMI.      Signed, Abigail Butts, PA-C  03/17/2019, 8:45 AM   [phone]
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
## 2019-03-17 NOTE — Plan of Care (Signed)
  Problem: Health Behavior/Discharge Planning: Goal: Ability to manage health-related needs will improve Outcome: Progressing   Problem: Clinical Measurements: Goal: Ability to maintain clinical measurements within normal limits will improve Outcome: Progressing Goal: Will remain free from infection Outcome: Progressing Goal: Diagnostic test results will improve Outcome: Progressing Goal: Respiratory complications will improve Outcome: Progressing Goal: Cardiovascular complication will be avoided Outcome: Progressing   Problem: Pain Managment: Goal: General experience of comfort will improve Outcome: Progressing   Problem: Safety: Goal: Ability to remain free from injury will improve Outcome: Progressing   Problem: Skin Integrity: Goal: Risk for impaired skin integrity will decrease Outcome: Progressing

## 2019-03-17 NOTE — Progress Notes (Signed)
Report received from S. Hoefler, Therapist, sports. No change in assessment. Continue plan of care. Stacey Drain
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
## 2019-03-18 ENCOUNTER — Telehealth: Payer: Self-pay | Admitting: Adult Health

## 2019-03-18 ENCOUNTER — Other Ambulatory Visit: Payer: Self-pay | Admitting: Medical

## 2019-03-18 ENCOUNTER — Inpatient Hospital Stay (HOSPITAL_COMMUNITY): Payer: Medicare Other | Admitting: Certified Registered"

## 2019-03-18 ENCOUNTER — Encounter (HOSPITAL_COMMUNITY): Payer: Self-pay

## 2019-03-18 ENCOUNTER — Encounter (HOSPITAL_COMMUNITY)
Admission: EM | Disposition: A | Discharge status: Home Health | Payer: Self-pay | Source: Home / Self Care | Attending: Internal Medicine

## 2019-03-18 ENCOUNTER — Inpatient Hospital Stay (HOSPITAL_COMMUNITY): Payer: Medicare Other

## 2019-03-18 DIAGNOSIS — Z720 Tobacco use: Secondary | ICD-10-CM

## 2019-03-18 DIAGNOSIS — I340 Nonrheumatic mitral (valve) insufficiency: Secondary | ICD-10-CM

## 2019-03-18 HISTORY — PX: BUBBLE STUDY: SHX6837

## 2019-03-18 HISTORY — PX: TEE WITHOUT CARDIOVERSION: SHX5443

## 2019-03-18 LAB — CULTURE, RESPIRATORY W GRAM STAIN: Gram Stain: NONE SEEN

## 2019-03-18 LAB — CBC
HCT: 32 % — ABNORMAL LOW (ref 36.0–46.0)
Hemoglobin: 10.1 g/dL — ABNORMAL LOW (ref 12.0–15.0)
MCH: 30.4 pg (ref 26.0–34.0)
MCHC: 31.6 g/dL (ref 30.0–36.0)
MCV: 96.4 fL (ref 80.0–100.0)
Platelets: 439 10*3/uL — ABNORMAL HIGH (ref 150–400)
RBC: 3.32 MIL/uL — ABNORMAL LOW (ref 3.87–5.11)
RDW: 13.4 % (ref 11.5–15.5)
WBC: 10.7 10*3/uL — ABNORMAL HIGH (ref 4.0–10.5)
nRBC: 0 % (ref 0.0–0.2)

## 2019-03-18 LAB — GLUCOSE, CAPILLARY
Glucose-Capillary: 109 mg/dL — ABNORMAL HIGH (ref 70–99)
Glucose-Capillary: 109 mg/dL — ABNORMAL HIGH (ref 70–99)
Glucose-Capillary: 115 mg/dL — ABNORMAL HIGH (ref 70–99)
Glucose-Capillary: 128 mg/dL — ABNORMAL HIGH (ref 70–99)

## 2019-03-18 LAB — BASIC METABOLIC PANEL
Anion gap: 10 (ref 5–15)
BUN: 51 mg/dL — ABNORMAL HIGH (ref 8–23)
CO2: 23 mmol/L (ref 22–32)
Calcium: 8.9 mg/dL (ref 8.9–10.3)
Chloride: 107 mmol/L (ref 98–111)
Creatinine, Ser: 1.29 mg/dL — ABNORMAL HIGH (ref 0.44–1.00)
GFR calc Af Amer: 48 mL/min — ABNORMAL LOW (ref >60)
GFR calc non Af Amer: 42 mL/min — ABNORMAL LOW (ref >60)
Glucose, Bld: 103 mg/dL — ABNORMAL HIGH (ref 70–99)
Potassium: 3.3 mmol/L — ABNORMAL LOW (ref 3.5–5.1)
Sodium: 140 mmol/L (ref 135–145)

## 2019-03-18 LAB — CULTURE, BLOOD (ROUTINE X 2)
Culture: NO GROWTH
Culture: NO GROWTH
Special Requests: ADEQUATE
Special Requests: ADEQUATE

## 2019-03-18 SURGERY — ECHOCARDIOGRAM, TRANSESOPHAGEAL
Anesthesia: Monitor Anesthesia Care

## 2019-03-18 MED ORDER — PROPOFOL 10 MG/ML IV BOLUS
INTRAVENOUS | Status: DC | PRN
  Administered 2019-03-18: 20 mg via INTRAVENOUS
  Administered 2019-03-18: 30 mg via INTRAVENOUS
  Administered 2019-03-18 (×2): 20 mg via INTRAVENOUS

## 2019-03-18 MED ORDER — PROPOFOL 500 MG/50ML IV EMUL
INTRAVENOUS | Status: DC | PRN
  Administered 2019-03-18: 100 ug/kg/min via INTRAVENOUS

## 2019-03-18 MED ORDER — ENOXAPARIN SODIUM 40 MG/0.4ML ~~LOC~~ SOLN
40.0000 mg | SUBCUTANEOUS | Status: DC
  Administered 2019-03-18: 40 mg via SUBCUTANEOUS

## 2019-03-18 MED ORDER — PREDNISONE 10 MG PO TABS: ORAL_TABLET | ORAL | 0 refills | Status: DC

## 2019-03-18 MED ORDER — LIDOCAINE 2% (20 MG/ML) 5 ML SYRINGE
INTRAMUSCULAR | Status: DC | PRN
  Administered 2019-03-18: 40 mg via INTRAVENOUS

## 2019-03-18 MED ORDER — SODIUM CHLORIDE 0.9 % IV SOLN
INTRAVENOUS | Status: DC
  Administered 2019-03-18: 09:00:00 via INTRAVENOUS

## 2019-03-18 MED ORDER — GUAIFENESIN ER 600 MG PO TB12: 600.0000 mg | ORAL_TABLET | ORAL | 0 refills | Status: AC | PRN

## 2019-03-18 MED ORDER — FUROSEMIDE 40 MG PO TABS: 40.0000 mg | ORAL_TABLET | Freq: Every day | ORAL | 0 refills | Status: DC

## 2019-03-18 NOTE — Progress Notes (Signed)
OT Cancellation Note  Patient Details Name: Katie [name] MRN: [id number] DOB: 02/28/1946   Cancelled Treatment:    Reason Eval/Treat Not Completed: Patient at procedure or test/ unavailable Pt unavailable, at procedure. Will follow up as pt available and appropriate to continue OT POC.   Zenovia Jarred, MSOT, OTR/L Behavioral Health OT/ Acute Relief OT [facility] Office: [place] 03/18/2019, 10:25 AM

## 2019-03-18 NOTE — Anesthesia Preprocedure Evaluation (Signed)
Anesthesia Evaluation  Patient identified by MRN, date of birth, ID band Patient awake    Reviewed: Allergy & Precautions, H&P , NPO status , Patient's Chart, lab work & pertinent test results  Airway Mallampati: I  TM Distance: >3 FB Neck ROM: Full    Dental  (+) Missing, Dental Advisory Given, Loose,    Pulmonary shortness of breath and with exertion, pneumonia, COPD,  COPD inhaler, Current Smoker,    Pulmonary exam normal breath sounds clear to auscultation       Cardiovascular hypertension, Pt. on medications + Valvular Problems/Murmurs  Rhythm:Regular Rate:Normal - Systolic murmurs    Neuro/Psych negative neurological ROS     GI/Hepatic Neg liver ROS, GERD  Controlled,  Endo/Other  negative endocrine ROS  Renal/GU negative Renal ROS     Musculoskeletal   Abdominal   Peds  Hematology   Anesthesia Other Findings   Reproductive/Obstetrics                             Anesthesia Physical  Anesthesia Plan  ASA: II  Anesthesia Plan: MAC   Post-op Pain Management:    Induction: Intravenous  PONV Risk Score and Plan: 1 and Ondansetron and Treatment may vary due to age or medical condition  Airway Management Planned: Nasal Cannula  Additional Equipment:   Intra-op Plan:   Post-operative Plan:   Informed Consent:     Dental advisory given  Plan Discussed with: CRNA and Surgeon  Anesthesia Plan Comments:         Anesthesia Quick Evaluation

## 2019-03-18 NOTE — CV Procedure (Signed)
TEE  Patient sedated by anesthesia with propofol  TEE probe advanced to mid esophagus without difficulty   TV normal   Mild to mod TR  Peak velocity apprximately 4 m/sec  AV is normal  No AI  PV is mildly thickened.  Mild PI  MV is mildly thickened  P2 segment appears partially flail with severe prolapse and severe MR that is projected anteriorly around walls of LA  LA appendage without masses  LVEF and RVEF are normal  No PFO as tested with injection of agitated saline  Mlnimal fixed plaque in thoracic aorta.

## 2019-03-18 NOTE — Anesthesia Postprocedure Evaluation (Signed)
Anesthesia Post Note  Patient: Katie [name]  Procedure(s) Performed: TRANSESOPHAGEAL ECHOCARDIOGRAM (TEE) (N/A ) BUBBLE STUDY     Patient location during evaluation: Endoscopy Anesthesia Type: MAC Level of consciousness: awake and alert Pain management: pain level controlled Vital Signs Assessment: post-procedure vital signs reviewed and stable Respiratory status: spontaneous breathing, nonlabored ventilation and respiratory function stable Cardiovascular status: stable and blood pressure returned to baseline Postop Assessment: no apparent nausea or vomiting Anesthetic complications: no    Last Vitals:  Vitals:   03/18/19 1016 03/18/19 1026  BP: (!) 116/39 99/64  Pulse: 74 80  Resp: (!) 29 (!) 28  Temp: (!) 36.1 C   SpO2: 99% 98%    Last Pain:  Vitals:   03/18/19 1026  TempSrc:   PainSc: 0-No pain                 Lynda Rainwater

## 2019-03-18 NOTE — Transfer of Care (Signed)
Immediate Anesthesia Transfer of Care Note  Patient: Katie [name]  Procedure(s) Performed: TRANSESOPHAGEAL ECHOCARDIOGRAM (TEE) (N/A ) BUBBLE STUDY  Patient Location: Endoscopy Unit  Anesthesia Type:MAC  Level of Consciousness: drowsy and patient cooperative  Airway & Oxygen Therapy: Patient Spontanous Breathing and Patient connected to nasal cannula oxygen  Post-op Assessment: Report given to RN, Post -op Vital signs reviewed and stable and Patient moving all extremities  Post vital signs: Reviewed and stable  Last Vitals:  Vitals Value Taken Time  BP    Temp    Pulse 77 03/18/2019 10:15 AM  Resp 29 03/18/2019 10:15 AM  SpO2 99 % 03/18/2019 10:15 AM  Vitals shown include unvalidated device data.  Last Pain:  Vitals:   03/18/19 0848  TempSrc: Oral  PainSc: 0-No pain      Patients Stated Pain Goal: 0 (02/27/01 1117)  Complications: No apparent anesthesia complications

## 2019-03-18 NOTE — Progress Notes (Signed)
Patient given discharge instructions and all questions answered.  

## 2019-03-18 NOTE — H&P (View-Only) (Signed)
Progress Note  Patient Name: Katie [name] Date of Encounter: 03/18/2019  Primary Cardiologist: Skeet Latch, MD   Subjective   Seen both before and after TEE. Breathing is better. She is nervous; I reviewed the results of the TEE with her and with Dr. Harrington Challenger. Severe MR seen. She will need a R/LHC and CT surgery evaluation. I offered to do that while she is inpatient, but she would prefer to go home and come back. I did discuss with her at length that if her breathing deteriorates after she goes home, she needs to seek immediate medical attention. I also told her she needs smoking cessation given her lung status. She understands and agrees.  Inpatient Medications    Scheduled Meds: . atorvastatin  80 mg Oral q1800  . azithromycin  500 mg Oral Daily  . Chlorhexidine Gluconate Cloth  6 each Topical Daily  . enoxaparin (LOVENOX) injection  30 mg Subcutaneous Q24H  . furosemide  40 mg Oral Daily  . guaiFENesin  600 mg Oral Q12H  . insulin aspart  0-9 Units Subcutaneous Q4H  . ipratropium-albuterol  3 mL Nebulization TID  . mouth rinse  15 mL Mouth Rinse BID  . methylPREDNISolone (SOLU-MEDROL) injection  60 mg Intravenous Q8H  . metoprolol tartrate  25 mg Oral BID  . mometasone-formoterol  1 puff Inhalation BID  . nicotine  21 mg Transdermal Daily  . senna  1 tablet Oral BID  . sodium chloride flush  3 mL Intravenous Q12H   Continuous Infusions:  PRN Meds: acetaminophen **OR** acetaminophen, albuterol, HYDROcodone-acetaminophen, ondansetron **OR** ondansetron (ZOFRAN) IV, polyethylene glycol, sodium chloride   Vital Signs    Vitals:   03/17/19 1942 03/17/19 2037 03/18/19 0414 03/18/19 0748  BP:  113/72 122/76   Pulse:      Resp:  18 18   Temp:  97.8 F (36.6 C) 98 F (36.7 C)   TempSrc:  Oral Oral   SpO2: 97% 94% 98% 95%  Weight:      Height:        Intake/Output Summary (Last 24 hours) at 03/18/2019 0755 Last data filed at 03/18/2019 0200 Gross per 24 hour   Intake 700 ml  Output 300 ml  Net 400 ml   Filed Weights   03/15/19 1123 03/16/19 0416 03/17/19 0434  Weight: 65.8 kg 64.8 kg 65.1 kg    Telemetry    NSR - Personally Reviewed  Physical Exam   GEN: No acute distress.   Neck: No visible JVD, no carotid bruits Cardiac: RRR, no rubs or gallops. 3/6 HSM. Respiratory: Clear to auscultation bilaterally, long expiratory phase but no active wheezing GI: NABS, Soft, nontender, non-distended  MS: No edema; No deformity. Neuro:  Nonfocal, moving all extremities spontaneously Psych: Normal affect   Labs    Chemistry Recent Labs  Lab 03/13/19 0654 03/14/19 0331 03/15/19 0602 03/16/19 0528  NA 136 141 138 138  K 4.7 4.6 3.6 3.5  CL 109 110 104 102  CO2 19* 22 22 26   GLUCOSE 129* 133* 131* 121*  BUN 21 27* 39* 51*  CREATININE 1.19* 1.31* 1.56* 1.65*  CALCIUM 8.5* 9.4 9.5 9.1  PROT 6.4*  --   --   --   ALBUMIN 2.9*  --   --   --   AST 35  --   --   --   ALT 30  --   --   --   ALKPHOS 75  --   --   --  BILITOT 0.8  --   --   --   GFRNONAA 46* 41* 33* 31*  GFRAA 53* 47* 38* 36*  ANIONGAP 8 9 12 10      Hematology Recent Labs  Lab 03/14/19 0331 03/15/19 0602 03/16/19 0528  WBC 9.2 9.5 8.6  RBC 3.06* 3.07* 3.01*  HGB 9.3* 9.0* 8.9*  HCT 31.4* 29.4* 28.5*  MCV 102.6* 95.8 94.7  MCH 30.4 29.3 29.6  MCHC 29.6* 30.6 31.2  RDW 13.2 13.3 13.2  PLT 309 376 359    Cardiac Enzymes Recent Labs  Lab 03/12/19 2242 03/13/19 0654  TROPONINI <0.03 <0.03   No results for input(s): TROPIPOC in the last 168 hours.   BNP Recent Labs  Lab 03/12/19 2242  BNP 817.2*     DDimer No results for input(s): DDIMER in the last 168 hours.   Radiology    No results found.  Cardiac Studies   TEE 5.29.20 Mild-moderate TR, TR velocity 4 m/s Mild PI MV midly thickened, P2 partially flail with severe prolapse and severe eccentric wraparound MR    Echocardiogram 03/13/2019: 1. The left ventricle has normal systolic function  with an ejection fraction of 60-65%. The cavity size was normal. There is mildly increased left ventricular wall thickness. Left ventricular diastolic Doppler parameters are consistent with  pseudonormalization. Elevated mean left atrial pressure. 2. The right ventricle has normal systolic function. The cavity was normal. 3. The mitral valve is abnormal. Mild thickening of the mitral valve leaflet. Mitral valve regurgitation is moderate to severe by color flow Doppler. The MR jet is anteriorly-directed. 4. The tricuspid valve is grossly normal. 5. The inferior vena cava was dilated in size with >50% respiratory variability. 6. Normal LV systolic function; mild LVH; moderate diastolic dysfunction; thickened MV with prolapse of posterior MV leaflet and probable moderate to severe MR (difficult to quantitate due to eccentric nature of jet); suggest TEE to further assess. 7. Severe mitral valve prolapse.  Cardiac cath 2012: Abdominal aortogram- small saccular aneurysm seen in the distal aorta. Widely patent iliac vessels.  IMPRESSIONS: 1. No angiographically significant coronary artery disease. 2. Normal left ventricular systolic function. LVEDP 14 mmHg. Ejection fraction 65-70 %. 3. Normal right-sided heart pressures. 4. Normal cardiac output. 5. No significant mitral regurgitation present, no significant V wave on pulmonary capillary wedge pressure   TEE2012:  -EF 60% to 65%. - Mitral valve: Mildly thickened anterior leaflet. Severely thickened, myxomatous posterior leaflet with significant prolapse. Moderate to severe regurgitation directed eccentrically, mostly toward the septum. There are multiple MR jets noted  Patient Profile     73 y.o.femalewith a PMH of severe mitral valve prolapse, HCM with SAM carotid artery disease, HTN, HLD, COPD, RCC s/p R nephrectomy in 2011, CKD stage 3, and tobacco abuse, who is being followed by cardiology for MR and acute diastolic CHF.   Assessment & Plan    Severe MVP with severe HC:WCBJSEG chest wall echo this admission. TEE in 2012 reported above.  - TEE today with severe MR. - will need R/LHCto evaluate for CAD prior to CT Surgery evaluation.She does not wish to stay in the hospital for this, so we will arrange next week as outpatient. Her prior cath in 2012 did not show any obstructive CAD, but it has been 8 years since this evaluation. She was seen by Dr. Roxy Manns for this in 2012, so we will ask him to re-evaluate her given her progression of disease. - she is nervous about surgery but wants to get  this fixed. Would be open to discussing Mitra-Clip, though given her functional status I think she is likely a good candidate for surgery. Her lung issues/smoking are her biggest hurdle. - Continue metoprolol - Continue oral lasix. Avoid dehydration given prior SAM on distant echo. - keep K>4  Acute respiratory failure:likely multifactorial in the setting of COPD exacerbation and possible acute diastolic CHF. Initially required BiPAP with improvement in O2 sats on O2 via [place]. Now on room air. I/O not accurate. She is receiving antibiotics/nebuliers/steroids for COPD exacerbation.   -Continue po lasix - goal net even volume status - Continue antibiotics and supportive care per primary team - exam much improved, breathing comfortably on RA  Tobacco abuse: The patient was counseled on tobacco cessation today for 5 minutes.  Counseling included reviewing the risks of smoking tobacco products, how it impacts the patient's current medical diagnoses and different strategies for quitting.  Pharmacotherapy to aid in tobacco cessation was not prescribed today. -she understands, agrees to quit  HTN:BP stable but below her home numbers. Home losartan and HCTZ on hold given intermittently soft BP's and mild AKI on presentation - Continue metoprolol  - will be on home lasix, so would not restart HCTZ - now that renal function improving,  would restart losartan when BP allows for afterload reduction  No change today: HLD:LDL 115 09/2018, atorvastatin increased this admission -Continue statin  Carotid artery disease: 1-39% on carotid duplex 05/2017.  - Continue aspirin and statin  CKD stage 3:Hx RCC s/p right nephrectomy. Labile Cr over the past couple years - baseline 1.2-1.5. Cr 1.29 today. - Continue to monitor Cr closely with diuresis  7. Anemia:Hgb 11.4 on admission, today 10.1; baseline 12 03/06/2019. Denies gross blood loss -Continue management per primary team  TIME SPENT WITH PATIENT: >35 minutes of direct patient care. More than 50% of that time was spent on coordination of care and counseling regarding test results, next steps, cath, CT surgery referral.  Buford Dresser, MD, PhD [facility]  [facility]   For questions or updates, please contact [place] Please consult [email or web address] for contact info under Cardiology/STEMI.   Signed by: Buford Dresser, MD, PhD [facility]  [address], [address] [place], [place] [address] [phone]    Prepared by Abigail Butts, PA-C  03/18/2019, 7:55 AM   [phone]

## 2019-03-18 NOTE — TOC Transition Note (Signed)
Transition of Care [facility]) - CM/SW Discharge Note   Patient Details  Name: Katie [name] MRN: [id number] Date of Birth: [birth year]  Transition of Care [facility]) CM/SW Contact:  Dessa Phi, RN Phone Number: 03/18/2019, 2:16 PM   Clinical Narrative: d/c home w/HHC active [facility] rep Ronalee Belts aware. No further CM needs.        Barriers to Discharge: No Barriers Identified   Patient Goals and CMS Choice Patient states their goals for this hospitalization and ongoing recovery are:: to be able to walk again      Discharge Placement                       Discharge Plan and Services   Discharge Planning Services: CM Consult                      HH Arranged: PT, OT [place] Agency: [name] Date [place]: 03/15/19 Time [place]: [phone] Representative spoke with at [place]: [place] ([place]) Interventions     Readmission Risk Interventions Readmission Risk Prevention Plan 03/15/2019  Transportation Screening Complete  PCP or Specialist Appt within 3-5 Days Not Complete  Not Complete comments not yet ready for d/c  HRI or [place] Complete  Social Work Consult for [place] Planning/Counseling Not Complete  SW consult not completed comments no needs identified at this time  Palliative Care Screening Not Applicable  Medication Review [employer]) Complete  Some recent data might be hidden

## 2019-03-18 NOTE — Progress Notes (Signed)
Progress Note  Patient Name: Katie [name] Date of Encounter: 03/18/2019  Primary Cardiologist: Skeet Latch, MD   Subjective   Seen both before and after TEE. Breathing is better. She is nervous; I reviewed the results of the TEE with her and with Dr. Harrington Challenger. Severe MR seen. She will need a R/LHC and CT surgery evaluation. I offered to do that while she is inpatient, but she would prefer to go home and come back. I did discuss with her at length that if her breathing deteriorates after she goes home, she needs to seek immediate medical attention. I also told her she needs smoking cessation given her lung status. She understands and agrees.  Inpatient Medications    Scheduled Meds: . atorvastatin  80 mg Oral q1800  . azithromycin  500 mg Oral Daily  . Chlorhexidine Gluconate Cloth  6 each Topical Daily  . enoxaparin (LOVENOX) injection  30 mg Subcutaneous Q24H  . furosemide  40 mg Oral Daily  . guaiFENesin  600 mg Oral Q12H  . insulin aspart  0-9 Units Subcutaneous Q4H  . ipratropium-albuterol  3 mL Nebulization TID  . mouth rinse  15 mL Mouth Rinse BID  . methylPREDNISolone (SOLU-MEDROL) injection  60 mg Intravenous Q8H  . metoprolol tartrate  25 mg Oral BID  . mometasone-formoterol  1 puff Inhalation BID  . nicotine  21 mg Transdermal Daily  . senna  1 tablet Oral BID  . sodium chloride flush  3 mL Intravenous Q12H   Continuous Infusions:  PRN Meds: acetaminophen **OR** acetaminophen, albuterol, HYDROcodone-acetaminophen, ondansetron **OR** ondansetron (ZOFRAN) IV, polyethylene glycol, sodium chloride   Vital Signs    Vitals:   03/17/19 1942 03/17/19 2037 03/18/19 0414 03/18/19 0748  BP:  113/72 122/76   Pulse:      Resp:  18 18   Temp:  97.8 F (36.6 C) 98 F (36.7 C)   TempSrc:  Oral Oral   SpO2: 97% 94% 98% 95%  Weight:      Height:        Intake/Output Summary (Last 24 hours) at 03/18/2019 0755 Last data filed at 03/18/2019 0200 Gross per 24 hour   Intake 700 ml  Output 300 ml  Net 400 ml   Filed Weights   03/15/19 1123 03/16/19 0416 03/17/19 0434  Weight: 65.8 kg 64.8 kg 65.1 kg    Telemetry    NSR - Personally Reviewed  Physical Exam   GEN: No acute distress.   Neck: No visible JVD, no carotid bruits Cardiac: RRR, no rubs or gallops. 3/6 HSM. Respiratory: Clear to auscultation bilaterally, long expiratory phase but no active wheezing GI: NABS, Soft, nontender, non-distended  MS: No edema; No deformity. Neuro:  Nonfocal, moving all extremities spontaneously Psych: Normal affect   Labs    Chemistry Recent Labs  Lab 03/13/19 0654 03/14/19 0331 03/15/19 0602 03/16/19 0528  NA 136 141 138 138  K 4.7 4.6 3.6 3.5  CL 109 110 104 102  CO2 19* 22 22 26   GLUCOSE 129* 133* 131* 121*  BUN 21 27* 39* 51*  CREATININE 1.19* 1.31* 1.56* 1.65*  CALCIUM 8.5* 9.4 9.5 9.1  PROT 6.4*  --   --   --   ALBUMIN 2.9*  --   --   --   AST 35  --   --   --   ALT 30  --   --   --   ALKPHOS 75  --   --   --  BILITOT 0.8  --   --   --   GFRNONAA 46* 41* 33* 31*  GFRAA 53* 47* 38* 36*  ANIONGAP 8 9 12 10      Hematology Recent Labs  Lab 03/14/19 0331 03/15/19 0602 03/16/19 0528  WBC 9.2 9.5 8.6  RBC 3.06* 3.07* 3.01*  HGB 9.3* 9.0* 8.9*  HCT 31.4* 29.4* 28.5*  MCV 102.6* 95.8 94.7  MCH 30.4 29.3 29.6  MCHC 29.6* 30.6 31.2  RDW 13.2 13.3 13.2  PLT 309 376 359    Cardiac Enzymes Recent Labs  Lab 03/12/19 2242 03/13/19 0654  TROPONINI <0.03 <0.03   No results for input(s): TROPIPOC in the last 168 hours.   BNP Recent Labs  Lab 03/12/19 2242  BNP 817.2*     DDimer No results for input(s): DDIMER in the last 168 hours.   Radiology    No results found.  Cardiac Studies   TEE 5.29.20 Mild-moderate TR, TR velocity 4 m/s Mild PI MV midly thickened, P2 partially flail with severe prolapse and severe eccentric wraparound MR    Echocardiogram 03/13/2019: 1. The left ventricle has normal systolic  function with an ejection fraction of 60-65%. The cavity size was normal. There is mildly increased left ventricular wall thickness. Left ventricular diastolic Doppler parameters are consistent with  pseudonormalization. Elevated mean left atrial pressure. 2. The right ventricle has normal systolic function. The cavity was normal. 3. The mitral valve is abnormal. Mild thickening of the mitral valve leaflet. Mitral valve regurgitation is moderate to severe by color flow Doppler. The MR jet is anteriorly-directed. 4. The tricuspid valve is grossly normal. 5. The inferior vena cava was dilated in size with >50% respiratory variability. 6. Normal LV systolic function; mild LVH; moderate diastolic dysfunction; thickened MV with prolapse of posterior MV leaflet and probable moderate to severe MR (difficult to quantitate due to eccentric nature of jet); suggest TEE to further assess. 7. Severe mitral valve prolapse.  Cardiac cath 2012: Abdominal aortogram- small saccular aneurysm seen in the distal aorta. Widely patent iliac vessels.  IMPRESSIONS: 1. No angiographically significant coronary artery disease. 2. Normal left ventricular systolic function. LVEDP 14 mmHg. Ejection fraction 65-70 %. 3. Normal right-sided heart pressures. 4. Normal cardiac output. 5. No significant mitral regurgitation present, no significant V wave on pulmonary capillary wedge pressure   TEE2012:  -EF 60% to 65%. - Mitral valve: Mildly thickened anterior leaflet. Severely thickened, myxomatous posterior leaflet with significant prolapse. Moderate to severe regurgitation directed eccentrically, mostly toward the septum. There are multiple MR jets noted  Patient Profile     73 y.o.femalewith a PMH of severe mitral valve prolapse, HCM with SAM carotid artery disease, HTN, HLD, COPD, RCC s/p R nephrectomy in 2011, CKD stage 3, and tobacco abuse, who is being followed by cardiology for MR and acute diastolic  CHF.  Assessment & Plan    Severe MVP with severe BC:WUGQBVQ chest wall echo this admission. TEE in 2012 reported above.  - TEE today with severe MR. - will need R/LHCto evaluate for CAD prior to CT Surgery evaluation.She does not wish to stay in the hospital for this, so we will arrange next week as outpatient. Her prior cath in 2012 did not show any obstructive CAD, but it has been 8 years since this evaluation. She was seen by Dr. Roxy Manns for this in 2012, so we will ask him to re-evaluate her given her progression of disease. - she is nervous about surgery but wants to get  this fixed. Would be open to discussing Mitra-Clip, though given her functional status I think she is likely a good candidate for surgery. Her lung issues/smoking are her biggest hurdle. - Continue metoprolol - Continue oral lasix. Avoid dehydration given prior SAM on distant echo. - keep K>4  Acute respiratory failure:likely multifactorial in the setting of COPD exacerbation and possible acute diastolic CHF. Initially required BiPAP with improvement in O2 sats on O2 via [place]. Now on room air. I/O not accurate. She is receiving antibiotics/nebuliers/steroids for COPD exacerbation.   -Continue po lasix - goal net even volume status - Continue antibiotics and supportive care per primary team - exam much improved, breathing comfortably on RA  Tobacco abuse: The patient was counseled on tobacco cessation today for 5 minutes.  Counseling included reviewing the risks of smoking tobacco products, how it impacts the patient's current medical diagnoses and different strategies for quitting.  Pharmacotherapy to aid in tobacco cessation was not prescribed today. -she understands, agrees to quit  HTN:BP stable but below her home numbers. Home losartan and HCTZ on hold given intermittently soft BP's and mild AKI on presentation - Continue metoprolol  - will be on home lasix, so would not restart HCTZ - now that renal function  improving, would restart losartan when BP allows for afterload reduction  No change today: HLD:LDL 115 09/2018, atorvastatin increased this admission -Continue statin  Carotid artery disease: 1-39% on carotid duplex 05/2017.  - Continue aspirin and statin  CKD stage 3:Hx RCC s/p right nephrectomy. Labile Cr over the past couple years - baseline 1.2-1.5. Cr 1.29 today. - Continue to monitor Cr closely with diuresis  7. Anemia:Hgb 11.4 on admission, today 10.1; baseline 12 03/06/2019. Denies gross blood loss -Continue management per primary team  TIME SPENT WITH PATIENT: >35 minutes of direct patient care. More than 50% of that time was spent on coordination of care and counseling regarding test results, next steps, cath, CT surgery referral.  Buford Dresser, MD, PhD [facility]  [facility]   For questions or updates, please contact [place] Please consult [email or web address] for contact info under Cardiology/STEMI.   Signed by: Buford Dresser, MD, PhD [facility]  [address], [place] [address], [place] [address] [phone]    Prepared by Abigail Butts, PA-C  03/18/2019, 7:55 AM   [phone]

## 2019-03-18 NOTE — Discharge Instructions (Signed)
Your provider has recommended a cardiac catherization  You are scheduled for a cardiac catheterization on 03/22/2019 with Dr. Irish Lack or associate.  Please arrive at the [facility] (Main Entrance) at [facility] at [address], [place] on 03/22/2019 at 8:30am.    Special note: Every effort is made to have your procedure done on time.   Please understand that emergencies sometimes delay a scheduled   procedure.  No solid foods after midnight on 03/21/2019. On the morning of your procedure, do not take your lasix. You can take the remainder of your medications You may take your morning medications with a sip of water on the day of your procedure.  Please take a baby aspirin (81 mg) on the morning of your procedure.   Medications to HOLD -  Lasix  Plan for a one night stay -- bring personal belongings.  Bring a current list of your medications and current insurance cards.  You MUST have a responsible person to drive you home. Someone MUST be with you the first 24 hours after you arrive home or your discharge will be delayed. Wear clothes that are easy to get on and off and wear slip on shoes.    Coronary Angiogram A coronary angiogram, also called coronary angiography, is an X-ray procedure used to look at the arteries in the heart. In this procedure, a dye (contrast dye) is injected through a long, hollow tube (catheter). The catheter is about the size of a piece of cooked spaghetti and is inserted through your groin, wrist, or arm. The dye is injected into each artery, and X-rays are then taken to show if there is a blockage in the arteries of your heart.  LET [facility] CARE PROVIDER KNOW ABOUT:  Any allergies you have, including allergies to shellfish or contrast dye.    All medicines you are taking, including vitamins, herbs, eye drops, creams, and over-the-counter medicines.    Previous problems you or members of your family have had with the use  of anesthetics.    Any blood disorders you have.    Previous surgeries you have had.  History of kidney problems or failure.    Other medical conditions you have.  RISKS AND COMPLICATIONS  Generally, a coronary angiogram is a safe procedure. However, about 1 person out of 1000 can have problems that may include:  Allergic reaction to the dye.  Bleeding/bruising from the access site or other locations.  Kidney injury, especially in people with impaired kidney function.   Stroke (rare).  Heart attack (rare).  Irregular rhythms (rare)  Death (rare)  BEFORE THE PROCEDURE   Do not eat or drink anything after midnight the night before the procedure or as directed by your health care provider.    Ask your health care provider about changing or stopping your regular medicines. This is especially important if you are taking diabetes medicines or blood thinners.  PROCEDURE  You may be given a medicine to help you relax (sedative) before the procedure. This medicine is given through an intravenous (IV) access tube that is inserted into one of your veins.    The area where the catheter will be inserted will be washed and shaved. This is usually done in the groin but may be done in the fold of your arm (near your elbow) or in the wrist.     A medicine will be given to numb the area where the catheter will be  inserted (local anesthetic).    The health care provider will insert the catheter into an artery. The catheter will be guided by using a special type of X-ray (fluoroscopy) of the blood vessel being examined.    A special dye will then be injected into the catheter, and X-rays will be taken. The dye will help to show where any narrowing or blockages are located in the heart arteries.     AFTER THE PROCEDURE   If the procedure is done through the leg, you will be kept in bed lying flat for several hours. You will be instructed to not bend or cross your legs.  The insertion  site will be checked frequently.    The pulse in your feet or wrist will be checked frequently.    Additional blood tests, X-rays, and an electrocardiogram may be done.     ______________________________________________________________________________________________  YOUR CARDIOLOGY TEAM HAS ARRANGED FOR AN E-VISIT FOR YOUR APPOINTMENT - PLEASE REVIEW IMPORTANT INFORMATION BELOW SEVERAL DAYS PRIOR TO YOUR APPOINTMENT  Due to the recent COVID-19 pandemic, we are transitioning in-person office visits to tele-medicine visits in an effort to decrease unnecessary exposure to our patients, their families, and staff. These visits are billed to your insurance just like a normal visit is. We also encourage you to sign up for MyChart if you have not already done so. You will need a smartphone if possible. For patients that do not have this, we can still complete the visit using a regular telephone but do prefer a smartphone to enable video when possible. You may have a family member that lives with you that can help. If possible, we also ask that you have a blood pressure cuff and scale at home to measure your blood pressure, heart rate and weight prior to your scheduled appointment. Patients with clinical needs that need an in-person evaluation and testing will still be able to come to the office if absolutely necessary. If you have any questions, feel free to call our office.     YOUR PROVIDER WILL BE USING ONE OF THE FOLLOWING PLATFORMS TO COMPLETE YOUR VISIT:    IF USING MYCHART - How to Download the MyChart App to Your SmartPhone   - If Apple, go to [employer] and type in MyChart in the search bar and download the app. If Android, ask patient to go to [employer] and type in [place] in the search bar and download the app. The app is free but as with any other app downloads, your phone may require you to verify saved payment information or Apple/Android password.  - You will need to then log  into the app with your MyChart username and password, and select [facility] as your healthcare provider to link the account.  - When it is time for your visit, go to the MyChart app, find appointments, and click Begin Video Visit. Be sure to Select Allow for your device to access the Microphone and Camera for your visit. You will then be connected, and your provider will be with you shortly.  **If you have any issues connecting or need assistance, please contact MyChart service desk [phone])**  **If using a computer, in order to ensure the best quality for your visit, you will need to use either of the following Internet Browsers: Insurance underwriter or Microsoft Edge**   IF USING DOXIMITY or [email or web address] - The staff will give you instructions on receiving your link to join the meeting the day of  your visit.      2-3 DAYS BEFORE YOUR APPOINTMENT  You will receive a telephone call from one of our [place] team members - your caller ID may say "Unknown caller." If this is a video visit, we will walk you through how to get the video launched on your phone. We will remind you check your blood pressure, heart rate and weight prior to your scheduled appointment. If you have an Apple Watch or Kardia, please upload any pertinent ECG strips the day before or morning of your appointment to [place]. Our staff will also make sure you have reviewed the consent and agree to move forward with your scheduled tele-health visit.     THE DAY OF YOUR APPOINTMENT  Approximately 15 minutes prior to your scheduled appointment, you will receive a telephone call from one of [facility] team - your caller ID may say "Unknown caller."  Our staff will confirm medications, vital signs for the day and any symptoms you may be experiencing. Please have this information available prior to the time of visit start. It may also be helpful for you to have a pad of paper and pen handy for any instructions given during your  visit. They will also walk you through joining the smartphone meeting if this is a video visit.    CONSENT FOR TELE-HEALTH VISIT - PLEASE REVIEW  I hereby voluntarily request, consent and authorize [facility] and its employed or contracted physicians, physician assistants, nurse practitioners or other licensed health care professionals (the Practitioner), to provide me with telemedicine health care services (the Services") as deemed necessary by the treating Practitioner. I acknowledge and consent to receive the Services by the Practitioner via telemedicine. I understand that the telemedicine visit will involve communicating with the Practitioner through live audiovisual communication technology and the disclosure of certain medical information by electronic transmission. I acknowledge that I have been given the opportunity to request an in-person assessment or other available alternative prior to the telemedicine visit and am voluntarily participating in the telemedicine visit.  I understand that I have the right to withhold or withdraw my consent to the use of telemedicine in the course of my care at any time, without affecting my right to future care or treatment, and that the Practitioner or I may terminate the telemedicine visit at any time. I understand that I have the right to inspect all information obtained and/or recorded in the course of the telemedicine visit and may receive copies of available information for a reasonable fee.  I understand that some of the potential risks of receiving the Services via telemedicine include:   Delay or interruption in medical evaluation due to technological equipment failure or disruption;  Information transmitted may not be sufficient (e.g. poor resolution of images) to allow for appropriate medical decision making by the Practitioner; and/or   In rare instances, security protocols could fail, causing a breach of personal health  information.  Furthermore, I acknowledge that it is my responsibility to provide information about my medical history, conditions and care that is complete and accurate to the best of my ability. I acknowledge that Practitioner's advice, recommendations, and/or decision may be based on factors not within their control, such as incomplete or inaccurate data provided by me or distortions of diagnostic images or specimens that may result from electronic transmissions. I understand that the practice of medicine is not an exact science and that Practitioner makes no warranties or guarantees regarding treatment outcomes. I acknowledge that I will  receive a copy of this consent concurrently upon execution via email to the email address I last provided but may also request a printed copy by calling the office of [facility].    I understand that my insurance will be billed for this visit.   I have read or had this consent read to me.  I understand the contents of this consent, which adequately explains the benefits and risks of the Services being provided via telemedicine.   I have been provided ample opportunity to ask questions regarding this consent and the Services and have had my questions answered to my satisfaction.  I give my informed consent for the services to be provided through the use of telemedicine in my medical care  By participating in this telemedicine visit I agree to the above.

## 2019-03-18 NOTE — Telephone Encounter (Signed)
TOC Patient- Please call Patient- Katie [name] has an appt with Jory Sims on 03-29-19

## 2019-03-18 NOTE — Interval H&P Note (Signed)
History and Physical Interval Note:  03/18/2019 8:28 AM  Katie [name]  has presented today for surgery, with the diagnosis of mitral regurg.  The various methods of treatment have been discussed with the patient and family. After consideration of risks, benefits and other options for treatment, the patient has consented to  Procedure(s): TRANSESOPHAGEAL ECHOCARDIOGRAM (TEE) (N/A) as a surgical intervention.  The patient's history has been reviewed, patient examined, no change in status, stable for surgery.  I have reviewed the patient's chart and labs.  Questions were answered to the patient's satisfaction.     [name]

## 2019-03-18 NOTE — Telephone Encounter (Signed)
Still admitted-03/12/2019 - present (6 days) [place]

## 2019-03-18 NOTE — Discharge Summary (Signed)
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 edema, no cyanosis    The results of significant diagnostics from this hospitalization (including imaging, microbiology, ancillary and laboratory) are listed below for reference.     Microbiology: Recent Results (from the past 240 hour(s))  SARS Coronavirus 2 (CEPHEID - Performed in [place] hospital lab), Hosp Order     Status: None   Collection Time: 03/12/19 10:42 PM  Result Value Ref Range Status   SARS Coronavirus 2 NEGATIVE NEGATIVE Final    Comment: (NOTE) If result is NEGATIVE SARS-CoV-2 target nucleic acids are NOT DETECTED. The SARS-CoV-2 RNA is generally detectable in upper and lower  respiratory specimens during the acute phase of infection. The lowest  concentration of SARS-CoV-2 viral copies this assay can detect is 250  copies / mL. A negative result does not preclude SARS-CoV-2 infection  and should not be used as the sole basis for treatment or other  patient management decisions.  A negative result may occur with  improper specimen collection / handling,  submission of specimen other  than nasopharyngeal swab, presence of viral mutation(s) within the  areas targeted by this assay, and inadequate number of viral copies  (<250 copies / mL). A negative result must be combined with clinical  observations, patient history, and epidemiological information. If result is POSITIVE SARS-CoV-2 target nucleic acids are DETECTED. The SARS-CoV-2 RNA is generally detectable in upper and lower  respiratory specimens dur ing the acute phase of infection.  Positive  results are indicative of active infection with SARS-CoV-2.  Clinical  correlation with patient history and other diagnostic information is  necessary to determine patient infection status.  Positive results do  not rule out bacterial infection or co-infection with other viruses. If result is PRESUMPTIVE POSTIVE SARS-CoV-2 nucleic acids MAY BE PRESENT.   A presumptive positive result was obtained on the submitted specimen  and confirmed on repeat testing.  While 2019 novel coronavirus  (SARS-CoV-2) nucleic acids may be present in the submitted sample  additional confirmatory testing may be necessary for epidemiological  and / or clinical management purposes  to differentiate between  SARS-CoV-2 and other Sarbecovirus currently known to infect humans.  If clinically indicated additional testing with an alternate test  methodology [phone]) is advised. The SARS-CoV-2 RNA is generally  detectable in upper and lower respiratory sp ecimens during the acute  phase of infection. The expected result is Negative. Fact Sheet for Patients:  StrictlyIdeas.no Fact Sheet for Healthcare Providers: BankingDealers.co.za This test is not yet  approved or cleared by the [place] and has been authorized for detection and/or diagnosis of SARS-CoV-2 by FDA under an Emergency Use Authorization (EUA).  This EUA will remain in effect (meaning this test can be  used) for the duration of the COVID-19 declaration under Section 564(b)(1) of the Act, 21 U.S.C. section 360bbb-3(b)(1), unless the authorization is terminated or revoked sooner. Performed at [facility], [place] [address], [place], [place] [address]   Blood culture (routine x 2)     Status: None (Preliminary result)   Collection Time: 03/13/19 12:54 AM  Result Value Ref Range Status   Specimen Description   Final    BLOOD RIGHT ANTECUBITAL Performed at [facility] [address], [place], [place] [address]    Special Requests   Final    BOTTLES DRAWN AEROBIC AND ANAEROBIC Blood Culture adequate volume Performed at [facility] [address], [place], [place] [address]    Culture   Final    NO GROWTH 4 DAYS Performed at [facility], [place] [address], [place], [place] [address]    Report Status PENDING  Incomplete  Culture, sputum-assessment     Status: None   Collection Time: 03/13/19  3:57 AM  Result Value Ref Range Status   Specimen Description SPUTUM  Final   Special Requests NONE  Final   Sputum evaluation   Final    THIS SPECIMEN IS ACCEPTABLE FOR SPUTUM CULTURE Performed at [facility], [place] [address] [place], [place] [address]    Report Status 03/16/2019 FINAL  Final  Culture, respiratory     Status: None   Collection Time: 03/13/19  3:57 AM  Result Value Ref Range Status   Specimen Description   Final    SPUTUM Performed at [facility] [address] [place], [place] [address]    Special Requests   Final    NONE Reflexed from [id number] Performed at [facility], [place] [address], [place], [place] [address]    Gram Stain   Final    NO WBC SEEN RARE GRAM POSITIVE COCCI IN PAIRS IN CHAINS RARE GRAM POSITIVE RODS Performed at [facility], [place] [address], [place], [place] [address]    Culture ABUNDANT STREPTOCOCCUS MITIS/ORALIS  Final    Report Status 03/18/2019 FINAL  Final   Organism ID, Bacteria STREPTOCOCCUS MITIS/ORALIS  Final      Susceptibility   Streptococcus mitis/oralis - MIC*    TETRACYCLINE >=16 RESISTANT Resistant     VANCOMYCIN 0.5 SENSITIVE Sensitive     CLINDAMYCIN <=0.25 SENSITIVE Sensitive     * ABUNDANT STREPTOCOCCUS MITIS/ORALIS  Culture, blood (routine x 2) Call MD if unable to obtain prior to antibiotics being given     Status: None (Preliminary result)   Collection Time: 03/13/19  6:54 AM  Result Value Ref Range Status   Specimen Description   Final    LEFT ANTECUBITAL Performed at [facility], [place] [address] [place], [place] [address]    Special Requests   Final    BOTTLES DRAWN AEROBIC ONLY Blood Culture adequate volume Performed at [facility] [address], [place], [place] [address]    Culture   Final    NO GROWTH 4 DAYS Performed at [facility], [place] [address], [place],  [address]    Report Status PENDING  Incomplete  MRSA PCR Screening     Status:  None   Collection Time: 03/13/19 12:48 PM  Result Value Ref Range Status   MRSA by PCR NEGATIVE NEGATIVE Final    Comment:        The GeneXpert MRSA Assay (FDA approved for NASAL specimens only), is one component of a comprehensive MRSA colonization surveillance program. It is not intended to diagnose MRSA infection nor to guide or monitor treatment for MRSA infections. Performed at [facility], [place] [address], [place], [place] [address]      Labs: BNP (last 3 results) Recent Labs    03/12/19 2242  BNP 027.7*   Basic Metabolic Panel: Recent Labs  Lab 03/13/19 0654 03/14/19 0331 03/15/19 0602 03/16/19 0528 03/18/19 0811  NA 136 141 138 138 140  K 4.7 4.6 3.6 3.5 3.3*  CL 109 110 104 102 107  CO2 19* 22 22 26 23   GLUCOSE 129* 133* 131* 121* 103*  BUN 21 27* 39* 51* 51*  CREATININE 1.19* 1.31* 1.56* 1.65* 1.29*  CALCIUM 8.5* 9.4 9.5 9.1 8.9   MG 2.5*  --   --   --   --   PHOS 4.1  --   --   --   --    Liver Function Tests: Recent Labs  Lab 03/13/19 0654  AST 35  ALT 30  ALKPHOS 75  BILITOT 0.8  PROT 6.4*  ALBUMIN 2.9*   No results for input(s): LIPASE, AMYLASE in the last 168 hours. No results for input(s): AMMONIA in the last 168 hours. CBC: Recent Labs  Lab 03/12/19 2242 03/13/19 0654 03/14/19 0331 03/15/19 0602 03/16/19 0528 03/18/19 0811  WBC 13.2* 5.1 9.2 9.5 8.6 10.7*  NEUTROABS 10.4*  --   --   --   --   --   HGB 11.4* 9.2* 9.3* 9.0* 8.9* 10.1*  HCT 36.9 30.8* 31.4* 29.4* 28.5* 32.0*  MCV 96.9 98.4 102.6* 95.8 94.7 96.4  PLT 468* 277 309 376 359 439*   Cardiac Enzymes: Recent Labs  Lab 03/12/19 2242 03/13/19 0654  TROPONINI <0.03 <0.03   BNP: Invalid input(s): POCBNP CBG: Recent Labs  Lab 03/17/19 1549 03/17/19 1959 03/18/19 0017 03/18/19 0405 03/18/19 1248  GLUCAP 108* 109* 128* 115* 109*   D-Dimer No results for input(s): DDIMER in the last 72 hours. Hgb A1c No results for input(s): HGBA1C in the last 72 hours. Lipid Profile No results for input(s): CHOL, HDL, LDLCALC, TRIG, CHOLHDL, LDLDIRECT in the last 72 hours. Thyroid function studies No results for input(s): TSH, T4TOTAL, T3FREE, THYROIDAB in the last 72 hours.  Invalid input(s): FREET3 Anemia work up No results for input(s): VITAMINB12, FOLATE, FERRITIN, TIBC, IRON, RETICCTPCT in the last 72 hours. Urinalysis    Component Value Date/Time   COLORURINE YELLOW 03/13/2019 1711   APPEARANCEUR CLEAR 03/13/2019 1711   LABSPEC 1.009 03/13/2019 1711   PHURINE 5.0 03/13/2019 1711   GLUCOSEU NEGATIVE 03/13/2019 1711   GLUCOSEU NEGATIVE 04/29/2017 1122   HGBUR NEGATIVE 03/13/2019 1711   BILIRUBINUR NEGATIVE 03/13/2019 1711   BILIRUBINUR negative 06/11/2018 0911   KETONESUR NEGATIVE 03/13/2019 1711   PROTEINUR NEGATIVE 03/13/2019 1711   UROBILINOGEN 0.2 06/11/2018 0911   UROBILINOGEN 0.2 04/29/2017 1122   NITRITE NEGATIVE  03/13/2019 1711   LEUKOCYTESUR NEGATIVE 03/13/2019 1711   Sepsis Labs Invalid input(s): PROCALCITONIN,  WBC,  LACTICIDVEN Microbiology Recent Results (from the past 240 hour(s))  SARS Coronavirus 2 (CEPHEID - Performed in [place] hospital lab), Hosp Order     Status: None   Collection  Time: 03/12/19 10:42 PM  Result Value Ref Range Status   SARS Coronavirus 2 NEGATIVE NEGATIVE Final    Comment: (NOTE) If result is NEGATIVE SARS-CoV-2 target nucleic acids are NOT DETECTED. The SARS-CoV-2 RNA is generally detectable in upper and lower  respiratory specimens during the acute phase of infection. The lowest  concentration of SARS-CoV-2 viral copies this assay can detect is 250  copies / mL. A negative result does not preclude SARS-CoV-2 infection  and should not be used as the sole basis for treatment or other  patient management decisions.  A negative result may occur with  improper specimen collection / handling, submission of specimen other  than nasopharyngeal swab, presence of viral mutation(s) within the  areas targeted by this assay, and inadequate number of viral copies  (<250 copies / mL). A negative result must be combined with clinical  observations, patient history, and epidemiological information. If result is POSITIVE SARS-CoV-2 target nucleic acids are DETECTED. The SARS-CoV-2 RNA is generally detectable in upper and lower  respiratory specimens dur ing the acute phase of infection.  Positive  results are indicative of active infection with SARS-CoV-2.  Clinical  correlation with patient history and other diagnostic information is  necessary to determine patient infection status.  Positive results do  not rule out bacterial infection or co-infection with other viruses. If result is PRESUMPTIVE POSTIVE SARS-CoV-2 nucleic acids MAY BE PRESENT.   A presumptive positive result was obtained on the submitted specimen  and confirmed on repeat testing.  While 2019 novel  coronavirus  (SARS-CoV-2) nucleic acids may be present in the submitted sample  additional confirmatory testing may be necessary for epidemiological  and / or clinical management purposes  to differentiate between  SARS-CoV-2 and other Sarbecovirus currently known to infect humans.  If clinically indicated additional testing with an alternate test  methodology [phone]) is advised. The SARS-CoV-2 RNA is generally  detectable in upper and lower respiratory sp ecimens during the acute  phase of infection. The expected result is Negative. Fact Sheet for Patients:  StrictlyIdeas.no Fact Sheet for Healthcare Providers: BankingDealers.co.za This test is not yet approved or cleared by the [place] FDA and has been authorized for detection and/or diagnosis of SARS-CoV-2 by FDA under an Emergency Use Authorization (EUA).  This EUA will remain in effect (meaning this test can be used) for the duration of the COVID-19 declaration under Section 564(b)(1) of the Act, 21 U.S.C. section 360bbb-3(b)(1), unless the authorization is terminated or revoked sooner. Performed at [facility], [place] [address], [place], [place] [address]   Blood culture (routine x 2)     Status: None (Preliminary result)   Collection Time: 03/13/19 12:54 AM  Result Value Ref Range Status   Specimen Description   Final    BLOOD RIGHT ANTECUBITAL Performed at [facility] [address], [place], [place] [address]    Special Requests   Final    BOTTLES DRAWN AEROBIC AND ANAEROBIC Blood Culture adequate volume Performed at [facility] [place] [address], [place], [place] [address]    Culture   Final    NO GROWTH 4 DAYS Performed at [facility], [place] [address] [place], [place] [address]    Report Status PENDING  Incomplete  Culture, sputum-assessment     Status: None   Collection Time: 03/13/19  3:57 AM   Result Value Ref Range Status   Specimen Description SPUTUM  Final   Special Requests NONE  Final   Sputum evaluation   Final    THIS SPECIMEN IS ACCEPTABLE FOR SPUTUM CULTURE Performed at [facility] [address] [place], [place] [address]    Report Status 03/16/2019 FINAL  Final  Culture, respiratory     Status: None   Collection Time: 03/13/19  3:57 AM  Result Value Ref Range Status   Specimen Description   Final    SPUTUM Performed at [facility] [address], [place], [place] [address]    Special Requests   Final    NONE Reflexed from [id number] Performed at [facility], [place] [address] [place], [place] [address]    Gram Stain   Final    NO WBC SEEN RARE GRAM POSITIVE COCCI IN PAIRS IN CHAINS RARE GRAM POSITIVE RODS Performed at [facility], [place] [address] [place], [place] [address]    Culture ABUNDANT STREPTOCOCCUS MITIS/ORALIS  Final   Report Status 03/18/2019 FINAL  Final   Organism ID, Bacteria STREPTOCOCCUS MITIS/ORALIS  Final      Susceptibility   Streptococcus mitis/oralis - MIC*    TETRACYCLINE >=16 RESISTANT Resistant     VANCOMYCIN 0.5 SENSITIVE Sensitive     CLINDAMYCIN <=0.25 SENSITIVE Sensitive     * ABUNDANT STREPTOCOCCUS MITIS/ORALIS  Culture, blood (routine x 2) Call MD if unable to obtain prior to antibiotics being given     Status: None (Preliminary result)   Collection Time: 03/13/19  6:54 AM  Result Value Ref Range Status   Specimen Description   Final    LEFT ANTECUBITAL Performed at [facility], [place] [address] [place], [place] [address]    Special Requests   Final    BOTTLES DRAWN AEROBIC ONLY Blood Culture adequate volume Performed at [place] [address] [place], [place] [address]    Culture   Final    NO GROWTH 4 DAYS Performed at [facility], [place] [address] [place], [place] [address]    Report Status PENDING   Incomplete  MRSA PCR Screening     Status: None   Collection Time: 03/13/19 12:48 PM  Result Value Ref Range Status   MRSA by PCR NEGATIVE NEGATIVE Final    Comment:        The GeneXpert MRSA Assay (FDA approved for NASAL specimens only), is one component of a comprehensive MRSA colonization surveillance program. It is not intended to diagnose MRSA infection nor to guide or monitor treatment for MRSA infections. Performed at [facility], [place] [address], [place], [place] [address]      Time coordinating discharge: 45 minutes  SIGNED:   Darliss Cheney, MD  [facility] 03/18/2019, 1:11 PM Pager [phone]  If 7PM-7AM, please contact night-coverage [email or web address] Password [email or web address]

## 2019-03-20 DIAGNOSIS — M1611 Unilateral primary osteoarthritis, right hip: Secondary | ICD-10-CM | POA: Diagnosis not present

## 2019-03-20 DIAGNOSIS — E785 Hyperlipidemia, unspecified: Secondary | ICD-10-CM | POA: Diagnosis not present

## 2019-03-20 DIAGNOSIS — M47812 Spondylosis without myelopathy or radiculopathy, cervical region: Secondary | ICD-10-CM | POA: Diagnosis not present

## 2019-03-20 DIAGNOSIS — J449 Chronic obstructive pulmonary disease, unspecified: Secondary | ICD-10-CM | POA: Diagnosis not present

## 2019-03-20 DIAGNOSIS — S92354D Nondisplaced fracture of fifth metatarsal bone, right foot, subsequent encounter for fracture with routine healing: Secondary | ICD-10-CM | POA: Diagnosis not present

## 2019-03-20 DIAGNOSIS — S32038D Other fracture of third lumbar vertebra, subsequent encounter for fracture with routine healing: Secondary | ICD-10-CM | POA: Diagnosis not present

## 2019-03-20 DIAGNOSIS — I10 Essential (primary) hypertension: Secondary | ICD-10-CM | POA: Diagnosis not present

## 2019-03-21 ENCOUNTER — Other Ambulatory Visit (HOSPITAL_COMMUNITY)
Admission: RE | Admit: 2019-03-21 | Discharge: 2019-03-21 | Disposition: A | Discharge status: Home / Self Care | Payer: Medicare Other | Source: Ambulatory Visit | Attending: Interventional Cardiology | Admitting: Interventional Cardiology

## 2019-03-21 ENCOUNTER — Telehealth: Payer: Self-pay

## 2019-03-21 ENCOUNTER — Other Ambulatory Visit: Payer: Self-pay | Admitting: Medical

## 2019-03-21 ENCOUNTER — Encounter (HOSPITAL_COMMUNITY): Payer: Self-pay | Admitting: Internal Medicine

## 2019-03-21 ENCOUNTER — Other Ambulatory Visit: Payer: Self-pay

## 2019-03-21 ENCOUNTER — Telehealth: Payer: Self-pay | Admitting: Cardiovascular Disease

## 2019-03-21 ENCOUNTER — Telehealth: Payer: Self-pay | Admitting: *Deleted

## 2019-03-21 DIAGNOSIS — M1611 Unilateral primary osteoarthritis, right hip: Secondary | ICD-10-CM | POA: Diagnosis not present

## 2019-03-21 DIAGNOSIS — S32038D Other fracture of third lumbar vertebra, subsequent encounter for fracture with routine healing: Secondary | ICD-10-CM | POA: Diagnosis not present

## 2019-03-21 DIAGNOSIS — I10 Essential (primary) hypertension: Secondary | ICD-10-CM | POA: Diagnosis not present

## 2019-03-21 DIAGNOSIS — Z1159 Encounter for screening for other viral diseases: Secondary | ICD-10-CM | POA: Diagnosis not present

## 2019-03-21 DIAGNOSIS — J449 Chronic obstructive pulmonary disease, unspecified: Secondary | ICD-10-CM | POA: Diagnosis not present

## 2019-03-21 DIAGNOSIS — S92354D Nondisplaced fracture of fifth metatarsal bone, right foot, subsequent encounter for fracture with routine healing: Secondary | ICD-10-CM | POA: Diagnosis not present

## 2019-03-21 DIAGNOSIS — M47812 Spondylosis without myelopathy or radiculopathy, cervical region: Secondary | ICD-10-CM | POA: Diagnosis not present

## 2019-03-21 DIAGNOSIS — E785 Hyperlipidemia, unspecified: Secondary | ICD-10-CM | POA: Diagnosis not present

## 2019-03-21 LAB — SARS CORONAVIRUS 2 BY RT PCR (HOSPITAL ORDER, PERFORMED IN ~~LOC~~ HOSPITAL LAB): SARS Coronavirus 2: NEGATIVE

## 2019-03-21 NOTE — Telephone Encounter (Signed)
Patient contacted regarding discharge from [facility] on 03/18/2019.  Patient understands to follow up with provider K. Lawrene DNP on 03/29/2019 at 1:45 at [place] virtual Patient understands discharge instructions? YES Patient understands medications and regiment? YES Patient understands to bring all medications to this visit? N/A - virtual  She has heart cath tmr and has no questions concerning this either.

## 2019-03-21 NOTE — Progress Notes (Signed)
Patient has been scheduled for rapid COVID-19 testing in preparation for her upcoming [facility] 03/22/2019. Voicemail left on preferred number with detailed instructions to present to [facility] drive up testing center at 12:10pm today. Will attempt to contact patient again to confirm.   Abigail Butts, PA-C 03/21/19

## 2019-03-21 NOTE — Telephone Encounter (Signed)
Copied from [place] [id number]. Topic: Quick Communication - Home Health Verbal Orders >> Mar 21, 2019  9:17 AM [place] wrote: Caller/Agency:Michelle  with [facility] Callback [phone] Requesting /PT/Therapy: 2 times a week for 4 wks for general mobility Frequency:2 times 4 wks

## 2019-03-21 NOTE — Telephone Encounter (Signed)
LMOVM giving OK for requested PT

## 2019-03-21 NOTE — Telephone Encounter (Signed)
Ok to give verbal order.

## 2019-03-21 NOTE — Telephone Encounter (Signed)
New Message     Katie [name] is calling and is wondering if there is a prior authorization for the surgery tomorrow    Please call

## 2019-03-21 NOTE — Telephone Encounter (Addendum)
Pt contacted pre-catheterization scheduled at [facility] for: Tuesday March 22, 2019 10:30 AM Verified arrival time and place: [facility] Entrance A at: 8:30 AM  Covid-19 test date: 03/21/19 [facility]-cepheid-negative  No solid food after midnight prior to cath, clear liquids until 5 AM day of procedure. Contrast allergy: no  Hold: Furosemide-AM of procedure Losartan-AM of procedure-pt states she is not currently taking.  03/18/19 GFR 42-per [facility] [employer]: Does not recommend patient go in early for pre procedure hydration-pt recently admitted with volume overload due to MR.  Except hold medications AM meds can be  taken pre-cath with sip of water including: ASA 81 mg    Confirmed patient has responsible person to drive home post procedure and observe 24 hours after arriving home: yes   Pt advised due to Covid-19 pandemic no visitors are allowed in the hospital. Their designated party will be called when their procedure is over for an update and to arrange pick up.  Per Dr Leanne Chang Daily Covid Update 03/01/19: Universal masks: Effective immediately, we are requiring all ED patients and all ambulatory care patients to wear a universal mask, which we will provide. In-patients will have a mask at their bedside and will only be asked to put it on when a caregiver comes into their room.           COVID-19 Pre-Screening Questions:  . In the past 7 to 10 days have you had a cough,  shortness of breath, headache, congestion, fever (100 or greater) body aches, chills, sore throat, or sudden loss of taste or sense of smell? . Have you been around anyone with known Covid 19. . Have you been around anyone who is awaiting Covid 19 test results in the past 7 to 10 days? . Have you been around anyone who has been exposed to Covid 19, or has mentioned symptoms of Covid 19 within the past 7 to 10 days?  If you have any concerns/questions about symptoms patients report during screening (either  on the phone or at [facility]). Contact the provider seeing the patient or DOD for further guidance.  If neither are available contact a member of the leadership team.   I reviewed procedure instructions, visitor/mask,Covid 19 screening questions with patient, she verbalized understanding.

## 2019-03-21 NOTE — Telephone Encounter (Signed)
LM requesting call back to complete TCM and schedule hospital follow up.   

## 2019-03-21 NOTE — Telephone Encounter (Signed)
Transition Care Management Follow-up Telephone Call  Admit date: 03/12/2019 Discharge date: 03/18/2019 Principal Problem:  Acute respiratory failure with hypoxia and hypercapnia    How have you been since you were released from the hospital? "I'm okay"   Do you understand why you were in the hospital? yes   Do you understand the discharge instructions? yes   Where were you discharged to? Home.    Items Reviewed:  Medications reviewed: yes  Allergies reviewed: yes  Dietary changes reviewed: yes  Referrals reviewed: yes, pt to have cardic cath completed on 03/22/2019.    Functional Questionnaire:   Activities of Daily Living (ADLs):   She states they are independent in the following: ambulation, bathing and hygiene, feeding, continence, grooming, toileting and dressing States they require assistance with the following: None.    Any transportation issues/concerns?: no   Any patient concerns? no   Confirmed importance and date/time of follow-up visits scheduled yes  Provider Appointment booked with PCP 03/28/2019  Confirmed with patient if condition begins to worsen call PCP or go to the ER.  Patient was given the office number and encouraged to call back with question or concerns.  : yes

## 2019-03-22 ENCOUNTER — Other Ambulatory Visit: Payer: Self-pay

## 2019-03-22 ENCOUNTER — Encounter (HOSPITAL_COMMUNITY)
Admission: RE | Disposition: A | Discharge status: Home / Self Care | Payer: Self-pay | Source: Home / Self Care | Attending: Interventional Cardiology

## 2019-03-22 ENCOUNTER — Ambulatory Visit (HOSPITAL_COMMUNITY)
Admission: RE | Admit: 2019-03-22 | Discharge: 2019-03-22 | Disposition: A | Discharge status: Home / Self Care | Payer: Medicare Other | Attending: Interventional Cardiology | Admitting: Interventional Cardiology

## 2019-03-22 DIAGNOSIS — E785 Hyperlipidemia, unspecified: Secondary | ICD-10-CM | POA: Insufficient documentation

## 2019-03-22 DIAGNOSIS — I340 Nonrheumatic mitral (valve) insufficiency: Secondary | ICD-10-CM | POA: Diagnosis not present

## 2019-03-22 DIAGNOSIS — J441 Chronic obstructive pulmonary disease with (acute) exacerbation: Secondary | ICD-10-CM | POA: Diagnosis not present

## 2019-03-22 DIAGNOSIS — Z905 Acquired absence of kidney: Secondary | ICD-10-CM | POA: Insufficient documentation

## 2019-03-22 DIAGNOSIS — Z85528 Personal history of other malignant neoplasm of kidney: Secondary | ICD-10-CM | POA: Insufficient documentation

## 2019-03-22 DIAGNOSIS — D649 Anemia, unspecified: Secondary | ICD-10-CM | POA: Insufficient documentation

## 2019-03-22 DIAGNOSIS — I341 Nonrheumatic mitral (valve) prolapse: Secondary | ICD-10-CM | POA: Insufficient documentation

## 2019-03-22 DIAGNOSIS — I2510 Atherosclerotic heart disease of native coronary artery without angina pectoris: Secondary | ICD-10-CM | POA: Insufficient documentation

## 2019-03-22 DIAGNOSIS — I5032 Chronic diastolic (congestive) heart failure: Secondary | ICD-10-CM | POA: Insufficient documentation

## 2019-03-22 DIAGNOSIS — I130 Hypertensive heart and chronic kidney disease with heart failure and stage 1 through stage 4 chronic kidney disease, or unspecified chronic kidney disease: Secondary | ICD-10-CM | POA: Diagnosis not present

## 2019-03-22 DIAGNOSIS — N183 Chronic kidney disease, stage 3 (moderate): Secondary | ICD-10-CM | POA: Insufficient documentation

## 2019-03-22 HISTORY — PX: RIGHT/LEFT HEART CATH AND CORONARY ANGIOGRAPHY: CATH118266

## 2019-03-22 LAB — POCT I-STAT EG7
Bicarbonate: 25.3 mmol/L (ref 20.0–28.0)
Bicarbonate: 25.4 mmol/L (ref 20.0–28.0)
Calcium, Ion: 1.26 mmol/L (ref 1.15–1.40)
Calcium, Ion: 1.27 mmol/L (ref 1.15–1.40)
HCT: 26 % — ABNORMAL LOW (ref 36.0–46.0)
HCT: 27 % — ABNORMAL LOW (ref 36.0–46.0)
Hemoglobin: 8.8 g/dL — ABNORMAL LOW (ref 12.0–15.0)
Hemoglobin: 9.2 g/dL — ABNORMAL LOW (ref 12.0–15.0)
O2 Saturation: 68 %
O2 Saturation: 68 %
Potassium: 4.3 mmol/L (ref 3.5–5.1)
Potassium: 4.3 mmol/L (ref 3.5–5.1)
Sodium: 140 mmol/L (ref 135–145)
Sodium: 140 mmol/L (ref 135–145)
TCO2: 27 mmol/L (ref 22–32)
TCO2: 27 mmol/L (ref 22–32)
pCO2, Ven: 43.4 mmHg — ABNORMAL LOW (ref 44.0–60.0)
pCO2, Ven: 43.5 mmHg — ABNORMAL LOW (ref 44.0–60.0)
pH, Ven: 7.372 (ref 7.250–7.430)
pH, Ven: 7.375 (ref 7.250–7.430)
pO2, Ven: 36 mmHg (ref 32.0–45.0)
pO2, Ven: 36 mmHg (ref 32.0–45.0)

## 2019-03-22 LAB — POCT I-STAT 7, (LYTES, BLD GAS, ICA,H+H)
Bicarbonate: 25.1 mmol/L (ref 20.0–28.0)
Calcium, Ion: 1.26 mmol/L (ref 1.15–1.40)
HCT: 26 % — ABNORMAL LOW (ref 36.0–46.0)
Hemoglobin: 8.8 g/dL — ABNORMAL LOW (ref 12.0–15.0)
O2 Saturation: 97 %
Potassium: 4.3 mmol/L (ref 3.5–5.1)
Sodium: 140 mmol/L (ref 135–145)
TCO2: 26 mmol/L (ref 22–32)
pCO2 arterial: 40.9 mmHg (ref 32.0–48.0)
pH, Arterial: 7.396 (ref 7.350–7.450)
pO2, Arterial: 87 mmHg (ref 83.0–108.0)

## 2019-03-22 LAB — BASIC METABOLIC PANEL
Anion gap: 7 (ref 5–15)
BUN: 33 mg/dL — ABNORMAL HIGH (ref 8–23)
CO2: 27 mmol/L (ref 22–32)
Calcium: 8.8 mg/dL — ABNORMAL LOW (ref 8.9–10.3)
Chloride: 106 mmol/L (ref 98–111)
Creatinine, Ser: 1.28 mg/dL — ABNORMAL HIGH (ref 0.44–1.00)
GFR calc Af Amer: 49 mL/min — ABNORMAL LOW (ref >60)
GFR calc non Af Amer: 42 mL/min — ABNORMAL LOW (ref >60)
Glucose, Bld: 99 mg/dL (ref 70–99)
Potassium: 3.8 mmol/L (ref 3.5–5.1)
Sodium: 140 mmol/L (ref 135–145)

## 2019-03-22 LAB — CBC
HCT: 31.1 % — ABNORMAL LOW (ref 36.0–46.0)
Hemoglobin: 9.7 g/dL — ABNORMAL LOW (ref 12.0–15.0)
MCH: 29.8 pg (ref 26.0–34.0)
MCHC: 31.2 g/dL (ref 30.0–36.0)
MCV: 95.7 fL (ref 80.0–100.0)
Platelets: 375 10*3/uL (ref 150–400)
RBC: 3.25 MIL/uL — ABNORMAL LOW (ref 3.87–5.11)
RDW: 13.6 % (ref 11.5–15.5)
WBC: 12 10*3/uL — ABNORMAL HIGH (ref 4.0–10.5)
nRBC: 0 % (ref 0.0–0.2)

## 2019-03-22 SURGERY — RIGHT/LEFT HEART CATH AND CORONARY ANGIOGRAPHY
Anesthesia: LOCAL

## 2019-03-22 MED ORDER — VERAPAMIL HCL 2.5 MG/ML IV SOLN
INTRAVENOUS | Status: DC | PRN
  Administered 2019-03-22: 10 mL via INTRA_ARTERIAL

## 2019-03-22 MED ORDER — SODIUM CHLORIDE 0.9 % IV SOLN: 250.0000 mL | INTRAVENOUS | Status: DC | PRN

## 2019-03-22 MED ORDER — MIDAZOLAM HCL 2 MG/2ML IJ SOLN
INTRAMUSCULAR | Status: AC
  Filled 2019-03-22: qty 2

## 2019-03-22 MED ORDER — ACETAMINOPHEN 325 MG PO TABS: 650.0000 mg | ORAL_TABLET | ORAL | Status: DC | PRN

## 2019-03-22 MED ORDER — SODIUM CHLORIDE 0.9% FLUSH: 3.0000 mL | Freq: Two times a day (BID) | INTRAVENOUS | Status: DC

## 2019-03-22 MED ORDER — ALBUTEROL SULFATE (2.5 MG/3ML) 0.083% IN NEBU
INHALATION_SOLUTION | RESPIRATORY_TRACT | Status: AC
  Filled 2019-03-22: qty 3

## 2019-03-22 MED ORDER — ASPIRIN 81 MG PO CHEW: 81.0000 mg | CHEWABLE_TABLET | ORAL | Status: DC

## 2019-03-22 MED ORDER — ONDANSETRON HCL 4 MG/2ML IJ SOLN: 4.0000 mg | Freq: Four times a day (QID) | INTRAMUSCULAR | Status: DC | PRN

## 2019-03-22 MED ORDER — LABETALOL HCL 5 MG/ML IV SOLN: 10.0000 mg | INTRAVENOUS | Status: DC | PRN

## 2019-03-22 MED ORDER — HEPARIN (PORCINE) IN NACL 1000-0.9 UT/500ML-% IV SOLN
INTRAVENOUS | Status: AC
  Filled 2019-03-22: qty 1000

## 2019-03-22 MED ORDER — HEPARIN (PORCINE) IN NACL 1000-0.9 UT/500ML-% IV SOLN
INTRAVENOUS | Status: DC | PRN
  Administered 2019-03-22 (×2): 500 mL

## 2019-03-22 MED ORDER — SODIUM CHLORIDE 0.9% FLUSH: 3.0000 mL | INTRAVENOUS | Status: DC | PRN

## 2019-03-22 MED ORDER — FENTANYL CITRATE (PF) 100 MCG/2ML IJ SOLN
INTRAMUSCULAR | Status: DC | PRN
  Administered 2019-03-22 (×2): 25 ug via INTRAVENOUS

## 2019-03-22 MED ORDER — LIDOCAINE HCL (PF) 1 % IJ SOLN
INTRAMUSCULAR | Status: AC
  Filled 2019-03-22: qty 30

## 2019-03-22 MED ORDER — MIDAZOLAM HCL 2 MG/2ML IJ SOLN
INTRAMUSCULAR | Status: DC | PRN
  Administered 2019-03-22: 1 mg via INTRAVENOUS

## 2019-03-22 MED ORDER — HEPARIN SODIUM (PORCINE) 1000 UNIT/ML IJ SOLN
INTRAMUSCULAR | Status: DC | PRN
  Administered 2019-03-22: 3500 [IU] via INTRAVENOUS

## 2019-03-22 MED ORDER — ALBUTEROL SULFATE (2.5 MG/3ML) 0.083% IN NEBU
2.5000 mg | INHALATION_SOLUTION | Freq: Once | RESPIRATORY_TRACT | Status: AC
  Administered 2019-03-22: 2.5 mg via RESPIRATORY_TRACT
  Filled 2019-03-22: qty 3

## 2019-03-22 MED ORDER — HYDRALAZINE HCL 20 MG/ML IJ SOLN: 10.0000 mg | INTRAMUSCULAR | Status: DC | PRN

## 2019-03-22 MED ORDER — IOHEXOL 350 MG/ML SOLN
INTRAVENOUS | Status: DC | PRN
  Administered 2019-03-22: 55 mL via INTRAVENOUS

## 2019-03-22 MED ORDER — FENTANYL CITRATE (PF) 100 MCG/2ML IJ SOLN
INTRAMUSCULAR | Status: AC
  Filled 2019-03-22: qty 2

## 2019-03-22 MED ORDER — SODIUM CHLORIDE 0.9 % IV SOLN: INTRAVENOUS | Status: DC

## 2019-03-22 MED ORDER — LIDOCAINE HCL (PF) 1 % IJ SOLN
INTRAMUSCULAR | Status: DC | PRN
  Administered 2019-03-22: 5 mL
  Administered 2019-03-22: 2 mL

## 2019-03-22 MED ORDER — VERAPAMIL HCL 2.5 MG/ML IV SOLN
INTRAVENOUS | Status: AC
  Filled 2019-03-22: qty 2

## 2019-03-22 SURGICAL SUPPLY — 17 items
CATH 5FR JL3.5 JR4 ANG PIG MP (CATHETERS) ×1 IMPLANT
CATH BALLN WEDGE 5F 110CM (CATHETERS) ×1 IMPLANT
CATH INFINITI 5 FR 3DRC (CATHETERS) ×1 IMPLANT
COVER DOME SNAP 22 D (MISCELLANEOUS) ×1 IMPLANT
DEVICE RAD COMP TR BAND LRG (VASCULAR PRODUCTS) ×1 IMPLANT
GLIDESHEATH SLEND SS 6F .021 (SHEATH) ×1 IMPLANT
GUIDEWIRE ANGLED .035X150CM (WIRE) ×1 IMPLANT
GUIDEWIRE INQWIRE 1.5J.035X260 (WIRE) IMPLANT
INQWIRE 1.5J .035X260CM (WIRE) ×4
KIT HEART LEFT (KITS) ×2 IMPLANT
PACK CARDIAC CATHETERIZATION (CUSTOM PROCEDURE TRAY) ×2 IMPLANT
SHEATH GLIDE SLENDER 4/5FR (SHEATH) ×1 IMPLANT
SHEATH PROBE COVER 6X72 (BAG) ×1 IMPLANT
SYR MEDRAD MARK 7 150ML (SYRINGE) ×2 IMPLANT
TRANSDUCER W/STOPCOCK (MISCELLANEOUS) ×2 IMPLANT
TUBING CIL FLEX 10 FLL-RA (TUBING) ×2 IMPLANT
WIRE HI TORQ VERSACORE-J 145CM (WIRE) ×1 IMPLANT

## 2019-03-22 NOTE — Discharge Instructions (Signed)
Radial Site Care ° °This sheet gives you information about how to care for yourself after your procedure. Your health care provider may also give you more specific instructions. If you have problems or questions, contact your health care provider. °What can I expect after the procedure? °After the procedure, it is common to have: °· Bruising and tenderness at the catheter insertion area. °Follow these instructions at home: °Medicines °· Take over-the-counter and prescription medicines only as told by your health care provider. °Insertion site care °· Follow instructions from your health care provider about how to take care of your insertion site. Make sure you: °? Wash your hands with soap and water before you change your bandage (dressing). If soap and water are not available, use hand sanitizer. °? Change your dressing as told by your health care provider. °? Leave stitches (sutures), skin glue, or adhesive strips in place. These skin closures may need to stay in place for 2 weeks or longer. If adhesive strip edges start to loosen and curl up, you may trim the loose edges. Do not remove adhesive strips completely unless your health care provider tells you to do that. °· Check your insertion site every day for signs of infection. Check for: °? Redness, swelling, or pain. °? Fluid or blood. °? Pus or a bad smell. °? Warmth. °· Do not take baths, swim, or use a hot tub until your health care provider approves. °· You may shower 24-48 hours after the procedure, or as directed by your health care provider. °? Remove the dressing and gently wash the site with plain soap and water. °? Pat the area dry with a clean towel. °? Do not rub the site. That could cause bleeding. °· Do not apply powder or lotion to the site. °Activity ° °· For 24 hours after the procedure, or as directed by your health care provider: °? Do not flex or bend the affected arm. °? Do not push or pull heavy objects with the affected arm. °? Do not  drive yourself home from the hospital or clinic. You may drive 24 hours after the procedure unless your health care provider tells you not to. °? Do not operate machinery or power tools. °· Do not lift anything that is heavier than 10 lb (4.5 kg), or the limit that you are told, until your health care provider says that it is safe. °· Ask your health care provider when it is okay to: °? Return to work or school. °? Resume usual physical activities or sports. °? Resume sexual activity. °General instructions °· If the catheter site starts to bleed, raise your arm and put firm pressure on the site. If the bleeding does not stop, get help right away. This is a medical emergency. °· If you went home on the same day as your procedure, a responsible adult should be with you for the first 24 hours after you arrive home. °· Keep all follow-up visits as told by your health care provider. This is important. °Contact a health care provider if: °· You have a fever. °· You have redness, swelling, or yellow drainage around your insertion site. °Get help right away if: °· You have unusual pain at the radial site. °· The catheter insertion area swells very fast. °· The insertion area is bleeding, and the bleeding does not stop when you hold steady pressure on the area. °· Your arm or hand becomes pale, cool, tingly, or numb. °These symptoms may represent a serious problem   that is an emergency. Do not wait to see if the symptoms will go away. Get medical help right away. Call your local emergency services (911 in the [place] Do not drive yourself to the hospital. °Summary °· After the procedure, it is common to have bruising and tenderness at the site. °· Follow instructions from your health care provider about how to take care of your radial site wound. Check the wound every day for signs of infection. °· Do not lift anything that is heavier than 10 lb (4.5 kg), or the limit that you are told, until your health care provider says  that it is safe. °This information is not intended to replace advice given to you by your health care provider. Make sure you discuss any questions you have with your health care provider. °Document Released: 11/08/2010 Document Revised: 11/11/2017 Document Reviewed: 11/11/2017 °Elsevier Interactive Patient Education © 2019 [employer] ° °

## 2019-03-22 NOTE — Progress Notes (Signed)
Pt arrived from cath lab to holding complaining of chest pain and SOB.  MD aware.Sat 93% on RA,  Wheezing noted.  Chest pain has eased down to 5 from 7.  SOB eased with O2 2l/[place].  Dr Irish Lack paged.  Does use inhalers at home.

## 2019-03-22 NOTE — Interval H&P Note (Signed)
Cath Lab Visit (complete for each Cath Lab visit)  Clinical Evaluation Leading to the Procedure:   ACS: No.  Non-ACS:    Anginal Classification: CCS III  Anti-ischemic medical therapy: Minimal Therapy (1 class of medications)  Non-Invasive Test Results: No non-invasive testing performed  Prior CABG: No previous CABG   Severe mitral regurgitation   History and Physical Interval Note:  03/22/2019 11:29 AM  Katie [name]  has presented today for surgery, with the diagnosis of Mitral regurgitation.  The various methods of treatment have been discussed with the patient and family. After consideration of risks, benefits and other options for treatment, the patient has consented to  Procedure(s): RIGHT/LEFT HEART CATH AND CORONARY ANGIOGRAPHY (N/A) as a surgical intervention.  The patient's history has been reviewed, patient examined, no change in status, stable for surgery.  I have reviewed the patient's chart and labs.  Questions were answered to the patient's satisfaction.     Larae Grooms

## 2019-03-23 ENCOUNTER — Encounter (HOSPITAL_COMMUNITY): Payer: Self-pay | Admitting: Interventional Cardiology

## 2019-03-24 ENCOUNTER — Telehealth: Payer: Self-pay | Admitting: Adult Health

## 2019-03-24 ENCOUNTER — Other Ambulatory Visit: Payer: Self-pay | Admitting: *Deleted

## 2019-03-24 ENCOUNTER — Other Ambulatory Visit: Payer: Self-pay
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
## 2019-03-24 NOTE — Telephone Encounter (Signed)
Mychart, smartphone, consent (verbal), pr ereg complete 03/24/19 AF

## 2019-03-25 ENCOUNTER — Other Ambulatory Visit: Payer: Self-pay | Admitting: *Deleted

## 2019-03-25 ENCOUNTER — Encounter: Payer: Self-pay | Admitting: Thoracic Surgery (Cardiothoracic Vascular Surgery)

## 2019-03-25 ENCOUNTER — Encounter: Payer: Self-pay | Admitting: *Deleted

## 2019-03-25 ENCOUNTER — Institutional Professional Consult (permissible substitution): Payer: Medicare Other | Admitting: Thoracic Surgery (Cardiothoracic Vascular Surgery)

## 2019-03-25 VITALS — BP 108/70 | HR 80 | Temp 97.3°F | Resp 20 | Ht 61.0 in | Wt 143.0 lb

## 2019-03-25 DIAGNOSIS — M47812 Spondylosis without myelopathy or radiculopathy, cervical region: Secondary | ICD-10-CM | POA: Diagnosis not present

## 2019-03-25 DIAGNOSIS — I340 Nonrheumatic mitral (valve) insufficiency: Secondary | ICD-10-CM

## 2019-03-25 DIAGNOSIS — M1611 Unilateral primary osteoarthritis, right hip: Secondary | ICD-10-CM | POA: Diagnosis not present

## 2019-03-25 DIAGNOSIS — E785 Hyperlipidemia, unspecified: Secondary | ICD-10-CM | POA: Diagnosis not present

## 2019-03-25 DIAGNOSIS — S92354D Nondisplaced fracture of fifth metatarsal bone, right foot, subsequent encounter for fracture with routine healing: Secondary | ICD-10-CM | POA: Diagnosis not present

## 2019-03-25 DIAGNOSIS — I10 Essential (primary) hypertension: Secondary | ICD-10-CM | POA: Diagnosis not present

## 2019-03-25 DIAGNOSIS — S32038D Other fracture of third lumbar vertebra, subsequent encounter for fracture with routine healing: Secondary | ICD-10-CM | POA: Diagnosis not present

## 2019-03-25 DIAGNOSIS — J449 Chronic obstructive pulmonary disease, unspecified: Secondary | ICD-10-CM | POA: Diagnosis not present

## 2019-03-25 NOTE — Progress Notes (Signed)
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 +--------------+--------++  LV e' medial:    6.09 cm/s    LV PW:         1.32 cm    +----------------+---------++ +--------------+--------++  LV E/e' medial:  26.6         LV IVS:  1.31 cm    +----------------+---------++ +--------------+--------++  LVOT diam:     1.90 cm    +--------------+--------++  LV SV:         53 ml      +--------------+--------++  LV SV Index:   30.48      +--------------+--------++  LVOT Area:     2.84 cm   +--------------+--------++                            +--------------+--------++  +---------------+----------++  RIGHT VENTRICLE              +---------------+----------++  RV Basal diam:  3.56 cm       +---------------+----------++  RV S prime:     14.10 cm/s   +---------------+----------++  TAPSE (M-mode): 1.4 cm       +---------------+----------++  +---------------+-------++-----------++  LEFT ATRIUM              Index         +---------------+-------++-----------++  LA diam:        4.40 cm  2.62 cm/m    +---------------+-------++-----------++  LA Vol (A2C):   42.4 ml  25.28 ml/m   +---------------+-------++-----------++  LA Vol (A4C):   44.9 ml  26.77 ml/m   +---------------+-------++-----------++  LA Biplane Vol: 44.5 ml  26.53 ml/m   +---------------+-------++-----------++ +------------+---------++-----------++  RIGHT ATRIUM            Index         +------------+---------++-----------++  RA Area:     14.40 cm                +------------+---------++-----------++  RA Volume:   33.90 ml   20.21 ml/m   +------------+---------++-----------++  +------------------+------------++  AORTIC VALVE                      +------------------+------------++  AV Area (Vmax):    0.87 cm       +------------------+------------++  AV Area (Vmean):   0.73 cm       +------------------+------------++  AV Area (VTI):     0.66 cm       +------------------+------------++  AV Vmax:           435.00 cm/s    +------------------+------------++  AV Vmean:          310.000 cm/s   +------------------+------------++  AV VTI:            0.949 m        +------------------+------------++  AV Peak Grad:      75.7 mmHg      +------------------+------------++  AV Mean Grad:      43.0 mmHg      +------------------+------------++  LVOT Vmax:         133.00 cm/s    +------------------+------------++  LVOT Vmean:        79.400 cm/s    +------------------+------------++  LVOT VTI:          0.222 m        +------------------+------------++  LVOT/AV VTI ratio: 0.23           +------------------+------------++   +-------------+-------++  AORTA                    +-------------+-------++  Ao Root diam: 2.70 cm   +-------------+-------++  +--------------+----------++  MITRAL VALVE                 +--------------+-------+ +--------------+----------++  SHUNTS                   MV Area (PHT): 3.12 cm      +--------------+-------+ +--------------+----------++   Systemic VTI:  0.22 m    MV PHT:        70.47 msec    +--------------+-------+ +--------------+----------++   Systemic Diam: 1.90 cm   MV Decel Time: 243 msec      +--------------+-------+ +--------------+----------++ +--------------+-----------++  MV E velocity: 162.00 cm/s   +--------------+-----------++  MV A velocity: 104.00 cm/s   +--------------+-----------++  MV E/A ratio:  1.56          +--------------+-----------++    Kirk Ruths MD Electronically signed by Kirk Ruths MD Signature Date/Time: 03/13/2019/12:19:12 PM     TRANSESOPHOGEAL ECHO REPORT       Patient Name:   Katie [name] Date of Exam: 03/18/2019 Medical Rec #:  [id number]       Height:       61.0 in Accession #:    [id number]      Weight:       143.3 lb Date of Birth:  [birth year]       BSA:          1.64 m Patient Age:    70 years        BP:           103/71 mmHg Patient Gender: F               HR:           78 bpm. Exam Location:  Inpatient    Procedure: 2D Echo, Cardiac Doppler, Color Doppler and 3D Echo  Indications:     Mitral Insuffiency   History:         Patient has prior history of Echocardiogram examinations, most                  recent 03/13/2019. Mitral Valve Prolapse, Hypertension, COPD,                  Respritory Failure, COPD, Respritory Acidosis, Hyperlipidemia,                  Chronic Kidney Disease, Mitral Insuffiency, Tobacco Abuse.   Sonographer:     Madelaine Etienne RDCS (AE) Referring Phys:  [id number] Abigail Butts Diagnosing Phys: [name] MD     PROCEDURE: Normal Transesophogeal exam. After discussion of the risks and benefits of a TEE, an informed  consent was obtained from the patient. The transesophogeal probe was passed through the esophogus of the patient. Imaged were obtained with the  patient in a left lateral decubitus position. Image quality was excellent. The patient developed no complications during the procedure.  IMPRESSIONS    1. The right ventricle has normal systolc function.  2. LA appendage without masses.  3. The mitral valve is abnormal. Mild thickening of the mitral valve leaflet. Mitral valve regurgitation is severe by color flow Doppler.  4. Mitral regurgitation is severe. The P2 secment appears partially flail with severe prolapse into the LA. MR is projected anteriorly around to back of LA.  5. Tricuspid valve regurgitation is mild-moderate.  6. Peak TR velocity approximately 4 m/sec.  7. The aortic root is normal in size and structure.  8. The left ventricle has normal systolic function, with an ejection fraction of 55-60%.  FINDINGS  Left Ventricle: The left ventricle has normal systolic function, with an ejection fraction of 55-60%.  Right Ventricle: The right ventricle has normal systolic function.  Left Atrium: LA appendage without masses.   Right Atrium: Right atrial size was normal in size. Right atrial pressure is estimated at 10 mmHg.  Interatrial Septum: No atrial level shunt detected by color flow Doppler. Agitated saline contrast was given intravenously to evaluate for intracardiac shunting.  Mitral Valve: The mitral valve is abnormal. Mild thickening of the mitral valve leaflet. Mitral valve regurgitation is severe by color flow Doppler. Mitral regurgitation is severe. The P2 secment appears partially flail with severe prolapse into the LA.  MR is projected anteriorly around to back of LA.  Tricuspid Valve: Tricuspid valve regurgitation is mild-moderate by color flow Doppler. Peak TR velocity approximately 4 m/sec.  Aortic Valve: The aortic valve is normal in structure. Aortic  valve regurgitation was not visualized by color flow Doppler.  Pulmonic Valve: The pulmonic valve was grossly normal. Pulmonic valve regurgitation is mild by color flow Doppler.  Aorta: The aortic root is normal in size and structure.    [name] MD Electronically signed by [name] MD Signature Date/Time: 03/18/2019/7:53:09 PM   RIGHT/LEFT HEART CATH AND CORONARY ANGIOGRAPHY  Conclusion     Prox LAD lesion is 25% stenosed.  Prox Cx to Mid Cx lesion is 25% stenosed.  The left ventricular systolic function is normal.  LV end diastolic pressure is mildly elevated. LVEDP 17 mm Hg.  The left ventricular ejection fraction is 55-65% by visual estimate.  There is severe (4+) mitral regurgitation.  There is no aortic valve stenosis.  Ao sat 97%, PA sat 68%, CO 5.7 L/min; CI 3.4, mean PA 25 mm Hg, mean PCWP 20 mm Hg; prominent V waves noted on wedge tracing   Nonobstructive CAD.  Severe mitral regurgitation.   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   Recommendations   Discharge Date In the absence of any other complications or medical issues, we expect the patient to be ready for discharge from a cath perspective on 03/22/2019.  Surgeon Notes     03/18/2019 10:18 AM CV Procedure signed by Fay Records, MD  Indications   Severe mitral regurgitation [I34.0 (ICD-10-CM)]  Procedural Details   Technical Details The risks, benefits, and details of the procedure were explained to the patient.  The patient verbalized understanding and wanted to proceed.  Informed written consent was obtained.  PROCEDURE TECHNIQUE:  After Xylocaine anesthesia, a 5 French sheath was placed in the right antecubital area in exchange for a peripheral IV. A 5 French balloontipped Swan-Ganz catheter was advanced to the pulmonary artery under fluoroscopic guidance. Hemodynamic pressures were obtained. Oxygen saturations were obtained. After Xylocaine anesthesia, a 45F sheath was  placed in the right radial artery with a single anterior needle wall stick.   Left coronary angiography was done using a Judkins L3.5 guide catheter.  Right coronary angiography was done using a Judkins R4 guide catheter.  Left heart cath was done using a JR4 catheter.   Abdominal aortogram was attempted.  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 Glide was was advanced to the descending aorta but we could not get a catheter to follow due to the angulation.      Contrast: 100 cc  Estimated blood loss <50 mL.   During this procedure medications were administered to achieve and maintain moderate conscious sedation while the patient's heart rate, blood pressure, and oxygen saturation were continuously monitored and I was present face-to-face 100% of this time.  Medications  (Filter: Administrations occurring from 03/22/19 1114 to 03/22/19 1245)  Medication Rate/Dose/Volume Action  Date Time   fentaNYL (SUBLIMAZE) injection (mcg) 25 mcg Given 03/22/19 1138   Total dose as of 03/25/19 1314 25 mcg Given 1208   50 mcg        midazolam (VERSED) injection (mg) 1 mg Given 03/22/19 1139   Total dose as of 03/25/19 1314        1 mg        lidocaine (PF) (XYLOCAINE) 1 % injection (mL) 2 mL Given 03/22/19 1139   Total dose as of 03/25/19 1314 5 mL Given 1140   7 mL        Radial Cocktail/Verapamil only (mL) 10 mL Given 03/22/19 1145   Total dose as of 03/25/19 1314        10 mL        heparin injection (Units) 3,500 Units Given 03/22/19 1153   Total dose as of 03/25/19 1314        3,500 Units        Radial Cocktail/Verapamil only (mL) 10 mL Given 03/22/19 1216   Total dose as of 03/25/19 1314        10 mL        Heparin (Porcine) in NaCl 1000-0.9 UT/500ML-% SOLN (mL) 500 mL Given 03/22/19 1236   Total dose as of 03/25/19 1314 500 mL Given 1236   1,000 mL        iohexol (OMNIPAQUE) 350 MG/ML injection (mL) 55 mL Given 03/22/19 1240   Total dose as of 03/25/19 1314        55 mL         aspirin chewable tablet 81 mg (mg) *81 mg Not Given 03/22/19 1231   Dosing weight:  64.9 kg        Total dose as of 03/25/19 1314        0 mg        *Administration not included in total        Sedation Time   Sedation Time Physician-1: 44 minutes 44 seconds  Complications   Complications documented before study signed (03/22/2019 12:50 PM EDT)    No complications were associated with this study.  Documented by [name], MD - 03/22/2019 12:47 PM EDT    Coronary Findings   Diagnostic  Dominance: Right  Left Anterior Descending  Prox LAD lesion 25% stenosed  Prox LAD lesion is 25% stenosed.  Left Circumflex  Prox Cx to Mid Cx lesion 25% stenosed  Prox Cx to Mid Cx lesion is 25% stenosed.  Right Coronary Artery  There is mild diffuse disease throughout the vessel.  Intervention   No interventions have been documented.  Right Heart   Right Heart Pressures Ao sat 97%, PA sat 68%, CO 5.7 L/min; CI 3.4, mean PA 25 mm Hg, mean PCWP 20 mm Hg; prominent V waves noted on wedge tracing  Wall Motion   Resting               Left Heart   Left Ventricle The left ventricular size is normal. The left ventricular systolic function is normal. LV end diastolic pressure is mildly elevated. The left ventricular ejection fraction is 55-65% by visual estimate. No regional wall motion abnormalities. There is severe (4+) mitral regurgitation.  Aortic Valve There is no aortic valve stenosis.  Coronary Diagrams   Diagnostic  Dominance: Right    Intervention   Implants    No  implant documentation for this case.  Syngo Images   Show images for CARDIAC CATHETERIZATION  Images on Long Term Storage   Show images for Ranika, Mcniel to Procedure Log   Procedure Log    Hemo Data    Most Recent Value  Fick Cardiac Output 5.7 L/min  Fick Cardiac Output Index 3.48 (L/min)/BSA  RA A Wave 9 mmHg  RA V Wave 8 mmHg  RA Mean 6 mmHg  RV Systolic Pressure 38 mmHg  RV  Diastolic Pressure 3 mmHg  RV EDP 7 mmHg  PA Systolic Pressure 42 mmHg  PA Diastolic Pressure 13 mmHg  PA Mean 25 mmHg  PW A Wave 20 mmHg  PW V Wave 40 mmHg  PW Mean 20 mmHg  AO Systolic Pressure 87 mmHg  AO Diastolic Pressure 61 mmHg  AO Mean 73 mmHg  LV Systolic Pressure 84 mmHg  LV Diastolic Pressure 0 mmHg  LV EDP 17 mmHg  AOp Systolic Pressure 78 mmHg  AOp Diastolic Pressure 53 mmHg  AOp Mean Pressure 65 mmHg  LVp Systolic Pressure 77 mmHg  LVp Diastolic Pressure 8 mmHg  LVp EDP Pressure 16 mmHg  QP/QS 1  TPVR Index 7.19 HRUI  TSVR Index 18.7 HRUI  PVR SVR Ratio 0.08  TPVR/TSVR Ratio 0.38     Impression:  Patient has mitral valve prolapse with stage D severe symptomatic primary mitral regurgitation.  She presents with recent exacerbation of symptoms of exertional shortness of breath and occasional resting shortness of breath witn orthopnea consistent with acute on chronic diastolic congestive heart failure, [place] Heart Association functional class III-IV.  She was also treated recently for presumed acute exacerbation of chronic bronchitis.  Although her cough is improved she has remained quite limited since hospital discharge with at least class III symptoms of dyspnea.  I have personally reviewed the patient's recent echocardiograms and diagnostic cardiac catheterization.  The patient has mitral valve prolapse with severe prolapse involving a portion of the middle scallop of the posterior leaflet.  There are ruptured primary chordae tendinae and a flail segment of the posterior leaflet causing severe mitral regurgitation.  Left ventricular systolic function appears normal.  Diagnostic cardiac catheterization is notable for the absence of significant coronary artery disease and right heart pressures remain normal.  I agree the patient needs mitral valve repair in the near future.  She may be a reasonably good candidate for minimally invasive approach for surgery, although  risks of respiratory failure and/or pneumonia may be slightly elevated because of the patient's long history of tobacco abuse and COPD.  It would not be wise to proceed with surgery until her back brace can be removed for at least 6 weeks.   Plan:  The patient and her daughter were counseled at length regarding the indications, risks and potential benefits of mitral valve repair.  The rationale for elective surgery has been explained, including a comparison between surgery and continued medical therapy with close follow-up.  The likelihood of successful and durable mitral valve repair has been discussed with particular reference to the findings of their recent echocardiogram.  Based upon these findings and previous experience, I have quoted them a greater than 95 percent likelihood of successful valve repair with less than 1 percent risk of mortality or major morbidity.  Alternative surgical approaches have been discussed including a comparison between conventional sternotomy and minimally-invasive techniques.  The relative risks and benefits of each have been reviewed as they pertain to the patient's  specific circumstances, and expectations for the patient's postoperative convalescence has been discussed.  Expectations for the patient's postoperative convalescence have been discussed.  The patient desires to proceed with mitral valve repair as soon as practical.  She has a follow-up appointment with her orthopedic surgeon sometime next week.  She will confirm whether or not she can remove her back brace for an extended period of time without significant risk.  Assuming this is the case we tentatively plan to proceed with surgery on April 07, 2019.  The patient understands and accepts all potential risks of surgery including but not limited to risk of death, stroke or other neurologic complication, myocardial infarction, congestive heart failure, respiratory failure, renal failure, bleeding requiring  transfusion and/or reexploration, arrhythmia, infection or other wound complications, pneumonia, pleural and/or pericardial effusion, pulmonary embolus, aortic dissection or other major vascular complication, or delayed complications related to valve repair or replacement including but not limited to structural valve deterioration and failure, thrombosis, embolization, endocarditis, or paravalvular leak.  Specific risks potentially related to the minimally-invasive approach were discussed at length, including but not limited to risk of conversion to full or partial sternotomy, aortic dissection or other major vascular complication, unilateral acute lung injury or pulmonary edema, phrenic nerve dysfunction or paralysis, rib fracture, chronic pain, lung hernia, or lymphocele. All of their questions have been answered.     I spent in excess of 90 minutes during the conduct of this office consultation and >50% of this time involved direct face-to-face encounter with the patient for counseling and/or coordination of their care.    Valentina Gu. Roxy Manns, MD 03/25/2019 10:06 AM
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
## 2019-03-25 NOTE — Patient Instructions (Addendum)
Continue to avoid smoking, permanently.  Continue taking all current medications without change through the day before surgery.  Have nothing to eat or drink after midnight the night before surgery.  On the morning of surgery take only metoprolol with a sip of water.

## 2019-03-28 ENCOUNTER — Other Ambulatory Visit: Payer: Self-pay

## 2019-03-28 ENCOUNTER — Encounter: Payer: Self-pay | Admitting: Physician Assistant

## 2019-03-28 ENCOUNTER — Inpatient Hospital Stay: Payer: Medicare Other | Admitting: Physician Assistant

## 2019-03-28 ENCOUNTER — Ambulatory Visit (INDEPENDENT_AMBULATORY_CARE_PROVIDER_SITE_OTHER): Payer: Medicare Other | Admitting: Physician Assistant

## 2019-03-28 VITALS — BP 111/62 | HR 91

## 2019-03-28 DIAGNOSIS — J9601 Acute respiratory failure with hypoxia: Secondary | ICD-10-CM

## 2019-03-28 DIAGNOSIS — N183 Chronic kidney disease, stage 3 unspecified: Secondary | ICD-10-CM

## 2019-03-28 DIAGNOSIS — I10 Essential (primary) hypertension: Secondary | ICD-10-CM

## 2019-03-28 DIAGNOSIS — Z720 Tobacco use: Secondary | ICD-10-CM

## 2019-03-28 DIAGNOSIS — J441 Chronic obstructive pulmonary disease with (acute) exacerbation: Secondary | ICD-10-CM | POA: Diagnosis not present

## 2019-03-28 DIAGNOSIS — I341 Nonrheumatic mitral (valve) prolapse: Secondary | ICD-10-CM | POA: Diagnosis not present

## 2019-03-28 DIAGNOSIS — I5033 Acute on chronic diastolic (congestive) heart failure: Secondary | ICD-10-CM | POA: Diagnosis not present

## 2019-03-28 NOTE — Progress Notes (Signed)
Virtual Visit via Video Note   This visit type was conducted due to national recommendations for restrictions regarding the COVID-19 Pandemic (e.g. social distancing) in an effort to limit this patient's exposure and mitigate transmission in our community.  Due to her co-morbid illnesses, this patient is at least at moderate risk for complications without adequate follow up.  This format is felt to be most appropriate for this patient at this time.  All issues noted in this document were discussed and addressed.  A limited physical exam was performed with this format.  Please refer to the patient's chart for her consent to telehealth for [facility].   Date:  03/29/2019   ID:  Katie [name], DOB 1946-07-17, MRN [id number]  Patient Location: Home Provider Location: Office  PCP:  Brunetta Jeans, PA-C  Cardiologist:  Skeet Latch, MD  Electrophysiologist:  None   Evaluation Performed:  Follow-Up Visit  Chief Complaint:  Post-hospital follow-up  History of Present Illness:    Katie [name] is a 73 y.o. female with a PMH of severe MVP and severe MR, non-obstructive CAD, chronic diastolic CHF, carotid artery disease, HTN, HLD, COPD, RCC s/p right nephrectomy in 2011, CKD stage 3, and tobacco abuse who presents for post-hospital follow-up.  She was last evaluated by cardiology, Dr. Harrell Gave, during her admission to the hospital from 03/13/2019-03/18/2019 after presenting with SOB and LE edema. She was found to have severe MR on TTE/TEE that admission. She underwent an outpatient [facility] which revealed 25% stenosis in p-mLCx and pLAD, severe MR, and no AS. She was recently seen by Dr. Roxy Manns 03/25/2019 for the evaluation of severe mitral regurgitation and she was felt to be a good candidate for minimally invasive approach for mitral valve repair, tentatively scheduled for 04/07/2019.   She presents today via video visit and reports still feeling fatigued and short of breath since discharge  from the hospital, though this is stable overall. She reported not taking her lasix today due to hand cramping as she thought she might be dehydrated. She reported some chest tightness typical of her COPD, however she is planned for PFTs this afternoon and was instructed not to use her nebulizers/inhalers until after her test. She denies exertional chest pain, palpitations, dizziness, lightheadedness, or syncope. No complaints of weight gain or LE edema.   The patient does not have symptoms concerning for COVID-19 infection (fever, chills, cough, or new shortness of breath).    Past Medical History:  Diagnosis Date  . AAA (abdominal aortic aneurysm) ([place])   . Arthritis   . Cholelithiasis 2011   s/p cholescystectomy   . COPD (chronic obstructive pulmonary disease) ([place])   . Emphysema   . GERD (gastroesophageal reflux disease)   . History of heartburn   . History of renal cell carcinoma   . Hypercholesteremia   . Hypertension   . Hypertension   . Mitral regurgitation   . Pneumonia 1980's  . Renal cell carcinoma 01/2010   s/p right radical nephrectomy 01/2010,  followed [facility]  . Shortness of breath    Past Surgical History:  Procedure Laterality Date  . APPENDECTOMY  1970's  . BUBBLE STUDY  03/18/2019   Procedure: BUBBLE STUDY;  Surgeon: Fay Records, MD;  Location: [place];  Service: Cardiovascular;;  . CARDIAC CATHETERIZATION  09/2011  . CHOLECYSTECTOMY  01/2010  . DIAGNOSTIC LAPAROSCOPY    . KIDNEY SURGERY    . LEFT AND RIGHT HEART CATHETERIZATION WITH CORONARY ANGIOGRAM N/A 09/30/2011  Procedure: LEFT AND RIGHT HEART CATHETERIZATION WITH CORONARY ANGIOGRAM;  Surgeon: Candee Furbish, MD;  Location: [facility] CATH LAB;  Service: Cardiovascular;  Laterality: N/A;  . MULTIPLE EXTRACTIONS WITH ALVEOLOPLASTY  10/06/2011   Procedure: MULTIPLE EXTRACION WITH ALVEOLOPLASTY;  Surgeon: Lenn Cal, DDS;  Location: [place];  Service: Oral Surgery;  Laterality: N/A;  Multiple  extraction of tooth #'s 1, 6, 8, 10, 11, 22, 23, 26, 27, 28, 29 with alveoloplasty and Upper right buccal exostoses reductions.  . NEPHRECTOMY RADICAL  01/2010   right   . OVARIAN CYST REMOVAL  1970's   "went through belly button"  . RIGHT/LEFT HEART CATH AND CORONARY ANGIOGRAPHY N/A 03/22/2019   Procedure: RIGHT/LEFT HEART CATH AND CORONARY ANGIOGRAPHY;  Surgeon: [name], MD;  Location: [place] CV LAB;  Service: Cardiovascular;  Laterality: N/A;  . TEE WITHOUT CARDIOVERSION  10/01/2011   Procedure: TRANSESOPHAGEAL ECHOCARDIOGRAM (TEE);  Surgeon: [name];  Location: [place];  Service: Cardiovascular;  Laterality: N/A;  . TEE WITHOUT CARDIOVERSION N/A 03/18/2019   Procedure: TRANSESOPHAGEAL ECHOCARDIOGRAM (TEE);  Surgeon: Fay Records, MD;  Location: [facility] ENDOSCOPY;  Service: Cardiovascular;  Laterality: N/A;  . TUBAL LIGATION  1970's  . US ECHOCARDIOGRAPHY  09/2011     Current Meds  Medication Sig  . acetaminophen (TYLENOL) 325 MG tablet Take 2 tablets (650 mg total) by mouth every 6 (six) hours as needed.  . Acetaminophen-Codeine (TYLENOL/CODEINE #3) 300-30 MG tablet Take 1 tablet by mouth every 4 (four) hours as needed for pain.  Marland Kitchen albuterol (PROVENTIL) (2.5 MG/3ML) 0.083% nebulizer solution Take 3 mLs (2.5 mg total) by nebulization every 6 (six) hours as needed for wheezing or shortness of breath.  Marland Kitchen aspirin EC 81 MG tablet Take 1 tablet (81 mg total) by mouth daily.  Marland Kitchen atorvastatin (LIPITOR) 40 MG tablet Take 1 tablet (40 mg total) by mouth daily at 6 PM.  . fenofibrate 160 MG tablet Take 1 tablet (160 mg total) by mouth daily.  . furosemide (LASIX) 40 MG tablet Take 1 tablet (40 mg total) by mouth daily for 30 days.  Marland Kitchen guaiFENesin (MUCINEX) 600 MG 12 hr tablet Take 1 tablet (600 mg total) by mouth every 4 (four) hours as needed for up to 30 days for cough or to loosen phlegm.  Marland Kitchen losartan (COZAAR) 100 MG tablet Take 1 tablet (100 mg total) by mouth daily.   . metoprolol tartrate (LOPRESSOR) 25 MG tablet Take 1 tablet (25 mg total) by mouth 2 (two) times daily.  . Misc. Devices (PULSE OXIMETER FOR FINGER) MISC 1 Device by Does not apply route as needed.  . mometasone-formoterol (DULERA) 100-5 MCG/ACT AERO Inhale 2 puffs into the lungs 2 (two) times daily.  . predniSONE (DELTASONE) 10 MG tablet Take 4 tablets for 1 week followed by 3 tablets p.o. for 1 week followed by 2 tablets for 1 week followed by 1 tablet for 1 week.     Allergies:   Ace inhibitors   Social History   Tobacco Use  . Smoking status: Former Smoker    Packs/day: 0.50    Years: 53.00    Pack years: 26.50    Types: Cigarettes    Last attempt to quit: 03/18/2019    Years since quitting: 0.0  . Smokeless tobacco: Never Used  Substance Use Topics  . Alcohol use: Yes    Alcohol/week: 0.0 standard drinks    Comment:  "drink very rarely"  . Drug use: No    Types:  Cocaine    Comment: hx of cocaine last in 2006     Family Hx: The patient's family history includes COPD in her mother; Diabetes in her brother and brother.  ROS:   Please see the history of present illness.     All other systems reviewed and are negative.   Prior CV studies:   The following studies were reviewed today:  Echocardiogram 03/13/2019: IMPRESSIONS    1. The left ventricle has normal systolic function with an ejection fraction of 60-65%. The cavity size was normal. There is mildly increased left ventricular wall thickness. Left ventricular diastolic Doppler parameters are consistent with  pseudonormalization. Elevated mean left atrial pressure.  2. The right ventricle has normal systolic function. The cavity was normal.  3. The mitral valve is abnormal. Mild thickening of the mitral valve leaflet. Mitral valve regurgitation is moderate to severe by color flow Doppler. The MR jet is anteriorly-directed.  4. The tricuspid valve is grossly normal.  5. The inferior vena cava was dilated in size  with >50% respiratory variability.  6. Normal LV systolic function; mild LVH; moderate diastolic dysfunction; thickened MV with prolapse of posterior MV leaflet and probable moderate to severe MR (difficult to quantitate due to eccentric nature of jet); suggest TEE to further assess.  7. Severe mitral valve prolapse.  TEE 03/18/2019: IMPRESSIONS    1. The right ventricle has normal systolc function.  2. LA appendage without masses.  3. The mitral valve is abnormal. Mild thickening of the mitral valve leaflet. Mitral valve regurgitation is severe by color flow Doppler.  4. Mitral regurgitation is severe. The P2 secment appears partially flail with severe prolapse into the LA. MR is projected anteriorly around to back of LA.  5. Tricuspid valve regurgitation is mild-moderate.  6. Peak TR velocity approximately 4 m/sec.  7. The aortic root is normal in size and structure.  8. The left ventricle has normal systolic function, with an ejection fraction of 55-60%.  Right/Left Heart catheterization 03/22/2019:  Prox LAD lesion is 25% stenosed.  Prox Cx to Mid Cx lesion is 25% stenosed.  The left ventricular systolic function is normal.  LV end diastolic pressure is mildly elevated. LVEDP 17 mm Hg.  The left ventricular ejection fraction is 55-65% by visual estimate.  There is severe (4+) mitral regurgitation.  There is no aortic valve stenosis.  Ao sat 97%, PA sat 68%, CO 5.7 L/min; CI 3.4, mean PA 25 mm Hg, mean PCWP 20 mm Hg; prominent V waves noted on wedge tracing   Nonobstructive CAD.  Severe mitral regurgitation.   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   Labs/Other Tests and Data Reviewed:    EKG:  No ECG reviewed.  Recent Labs: 03/12/2019: B Natriuretic Peptide 817.2 03/13/2019: ALT 30; Magnesium 2.5; TSH 0.749 03/22/2019: BUN 33; Creatinine, Ser 1.28; Hemoglobin 9.2; Hemoglobin 8.8; Platelets 375; Potassium 4.3; Potassium 4.3; Sodium 140; Sodium 140    Recent Lipid Panel Lab Results  Component Value Date/Time   CHOL 223 (H) 09/29/2018 02:30 AM   TRIG 271 (H) 09/29/2018 02:30 AM   HDL 54 09/29/2018 02:30 AM   CHOLHDL 4.1 09/29/2018 02:30 AM   LDLCALC 115 (H) 09/29/2018 02:30 AM   LDLDIRECT 121.0 04/29/2017 11:22 AM    Wt Readings from Last 3 Encounters:  03/25/19 143 lb (64.9 kg)  03/22/19 143 lb (64.9 kg)  03/18/19 143 lb 4.8 oz (65 kg)     Objective:    Vital Signs:  BP 110/84   Pulse 87    VITAL SIGNS:  reviewed GEN:  no acute distress EYES:  sclerae anicteric, EOMI - Extraocular Movements Intact RESPIRATORY:  normal respiratory effort, symmetric expansion NEURO:  alert and oriented x 3, no obvious focal deficit PSYCH:  normal affect  ASSESSMENT & PLAN:    1. Severe MVP/MR: recently admitted with volume overload. Found to have severe MR on TTE/TEE. She is planned for minimally invasive mitral valve repair 04/07/2019 with Dr. Roxy Manns.  - Will check BMET today given complaints of muscle cramps to evaluate Cr/K.  - Continue lasix - She is scheduled for a CTA C/A/P and PFTs today for ongoing preop evaluation  2. COPD: recent admission included management for COPD exacerbation. Still with some chest tightness this afternoon as she is unable to use her inhaler/nebulizers while awaiting PFTs today - Continue symbicort - Continue prn albuterol nebulizers  3. Non-obstructive CAD: [facility] 03/22/2019 revealed 25% stenosis in LAD and LCx. No anginal complaints - Continue aspirin and statin  4. Chronic diastolic CHF: Breathing and LE edema stable since discharge. She reported some cramping in her hands which she attributed to the lasix. She did not take her lasix today as she felt she was dehydrated - Will check BMET today to monitor Cr/K - Plan to restart lasix tomorrow  - Continue metoprolol and losartan  5. HTN: BP well controlled - Continue metoprolol, losartan, an dlasix  6. HLD: LDL 115 09/2018. Atorvastatin increased to 40mg   during recent admission. Possible higher dose of atorvastatin is contributing to muscle cramps.  - Could consider lower dose atorvastatin if muscle cramps continue.  7. RCC s/p nephrectomy with ongoing CKD stage 3: Cr 1.28 03/22/2019 - Will repeat BMET for close monitoring given solitary kidney   COVID-19 Education: The signs and symptoms of COVID-19 were discussed with the patient and how to seek care for testing (follow up with PCP or arrange E-visit).  The importance of social distancing was discussed today.  Time:   Today, I have spent 9 minutes with the patient with telehealth technology discussing the above problems.     Medication Adjustments/Labs and Tests Ordered: Current medicines are reviewed at length with the patient today.  Concerns regarding medicines are outlined above.   Tests Ordered: Orders Placed This Encounter  Procedures  . Basic metabolic panel    Medication Changes: No orders of the defined types were placed in this encounter.   Disposition:  Follow up with an APP in 2-3 weeks following mitral valve repair  Signed, Abigail Butts, PA-C  03/29/2019 2:56 PM    [place]

## 2019-03-28 NOTE — Patient Instructions (Signed)
Instructions sent to MyChart

## 2019-03-28 NOTE — Progress Notes (Signed)
I have discussed the procedure for the virtual visit with the patient who has given consent to proceed with assessment and treatment.   Courney Garrod S Chantella Creech, CMA     

## 2019-03-28 NOTE — Progress Notes (Signed)
Virtual Visit via Video   I connected with patient on 03/28/19 at  9:20 AM EDT by a video enabled telemedicine application and verified that I am speaking with the correct person using two identifiers.  Location patient: Home Location provider: [place], Office Persons participating in the virtual visit: Patient, Provider, Meta (Patina Moore)  I discussed the limitations of evaluation and management by telemedicine and the availability of in person appointments. The patient expressed understanding and agreed to proceed.  Subjective:   HPI:   Patient presents today via Doxy.Me for hospital follow-up. Patient presented to the ER on 03/12/2019 with c/o worsening cough, chest tightness and SOB. Was diagnosed with acute respiratory failure 2/2 acute COPD exacerbation and acute on chronic diastolic CHF. Patient subsequently admitted to the hospital for further assessment and management.   Patient was placed on non-rebreather and eventually required BiPAP. COVID testing negative. Patient started on IV diuretics, IV solumedrol and Azithromycin. Patient began improving over the next few days and was weaned off the oxygen and remained off of oxygen for 3 days prior to discharge.Due to history of mitral valve regurgitation, cardiology was consulted and patient underwent both TTE and TEE confirming mitral valve prolapse. Cardiology recommended inpatient left and right heart cath which patient declined, instead opting for OP cath so she could go home to her family. She was discharged on 03/18/2019 with a taper of prednisone and to start Lasix 40 mg daily.   Since discharge, patient notes feeling better than during hospitalization but is still noting occasional heavy/labored breathing. Denies wheezing above baseline atpresent. Is still noting some mild bilateral leg swelling despite taking her Lasix as directed. Has had a higher intake of salt. Is trying to keep hydrated. Denies chest pain. Denies  new symptoms. PT is coming out later today for there assessment. She has follow-up scheduled with Cardiology and scheduled for a mitral valve repair on 04/20/2019 with Dr. Roxy Manns.  ROS:   See pertinent positives and negatives per HPI.  Patient Active Problem List   Diagnosis Date Noted  . Tobacco abuse 03/13/2019  . COPD with acute exacerbation ([place]) 03/13/2019  . Acute respiratory failure with hypoxia and hypercapnia ([place]) 03/13/2019  . COPD exacerbation ([place]) 03/13/2019  . Fx metatarsal 03/07/2019  . CKD (chronic kidney disease) stage 3, GFR 30-59 ml/min ([facility]) 09/29/2018  . Urinary urgency 06/11/2018  . Visit for preventive health examination 04/30/2017  . Osteoporosis 12/31/2016  . Arthralgia of both hands 11/11/2016  . Breast cancer screening 11/11/2016  . Chest pain 04/17/2016  . Hyperglycemia 08/03/2015  . Esophageal reflux   . Depression with anxiety   . Tobacco abuse disorder 06/27/2014  . Insomnia 07/15/2013  . Severe mitral regurgitation 09/29/2011  . COPD GOLD O  09/26/2011  . Essential hypertension 09/26/2011  . HLD (hyperlipidemia) 09/26/2011  . S/P cholecystectomy 09/26/2011    Social History   Tobacco Use  . Smoking status: Former Smoker    Packs/day: 0.50    Years: 53.00    Pack years: 26.50    Types: Cigarettes    Last attempt to quit: 03/18/2019    Years since quitting: 0.0  . Smokeless tobacco: Never Used  Substance Use Topics  . Alcohol use: Yes    Alcohol/week: 0.0 standard drinks    Comment:  "drink very rarely"    Current Outpatient Medications:  .  acetaminophen (TYLENOL) 325 MG tablet, Take 2 tablets (650 mg total) by mouth every 6 (six) hours as needed., Disp: ,  Rfl:  .  Acetaminophen-Codeine (TYLENOL/CODEINE #3) 300-30 MG tablet, Take 1 tablet by mouth every 4 (four) hours as needed for pain., Disp: 30 tablet, Rfl: 0 .  albuterol (PROVENTIL) (2.5 MG/3ML) 0.083% nebulizer solution, Take 3 mLs (2.5 mg total) by nebulization every 6 (six) hours as  needed for wheezing or shortness of breath., Disp: 150 mL, Rfl: 0 .  aspirin EC 81 MG tablet, Take 1 tablet (81 mg total) by mouth daily., Disp: 30 tablet, Rfl: 0 .  atorvastatin (LIPITOR) 40 MG tablet, Take 1 tablet (40 mg total) by mouth daily at 6 PM., Disp: 90 tablet, Rfl: 1 .  budesonide-formoterol (SYMBICORT) 160-4.5 MCG/ACT inhaler, Inhale 2 puffs into the lungs 2 (two) times daily., Disp: 3 Inhaler, Rfl: 1 .  docusate sodium (COLACE) 100 MG capsule, Take 1 capsule (100 mg total) by mouth 2 (two) times daily., Disp: 10 capsule, Rfl: 0 .  fenofibrate 160 MG tablet, Take 1 tablet (160 mg total) by mouth daily., Disp: 90 tablet, Rfl: 1 .  furosemide (LASIX) 40 MG tablet, Take 1 tablet (40 mg total) by mouth daily for 30 days., Disp: 30 tablet, Rfl: 0 .  guaiFENesin (MUCINEX) 600 MG 12 hr tablet, Take 1 tablet (600 mg total) by mouth every 4 (four) hours as needed for up to 30 days for cough or to loosen phlegm., Disp: 60 tablet, Rfl: 0 .  losartan (COZAAR) 100 MG tablet, Take 1 tablet (100 mg total) by mouth daily., Disp: 90 tablet, Rfl: 0 .  metoprolol tartrate (LOPRESSOR) 25 MG tablet, Take 1 tablet (25 mg total) by mouth 2 (two) times daily., Disp: 60 tablet, Rfl: 0 .  Misc. Devices (PULSE OXIMETER FOR FINGER) MISC, 1 Device by Does not apply route as needed., Disp: 1 each, Rfl: 0 .  predniSONE (DELTASONE) 10 MG tablet, Take 4 tablets for 1 week followed by 3 tablets p.o. for 1 week followed by 2 tablets for 1 week followed by 1 tablet for 1 week., Disp: 70 tablet, Rfl: 0  Allergies  Allergen Reactions  . Ace Inhibitors     Pseudoasthma/ renal failure    Objective:   BP 111/62   Pulse 91   Patient is well-developed, well-nourished in no acute distress.  Resting comfortably at home.  Head is normocephalic, atraumatic.  No labored breathing.  Speech is clear and coherent with logical contest.  Patient is alert and oriented at baseline.   Assessment and Plan:   1. Acute  respiratory failure with hypoxia ([place]) 2. COPD exacerbation ([place]) 3. Acute on chronic diastolic heart failure ([place]) Patient back to baseline. Notes finishing steroids as directed. Has stopped smoking. Feels much better. Is making sure to continue her maintenance inhalers. With swelling, notes there is still some mild swelling of feet/ankles with medication. Does not want to increase Lasix further if it can be helped. Discussed close monitoring of weight at home, continue 40 mg Lasix and significantly limit sodium. Follow-up discussed.  4. Mitral valve prolapse Has mitral valve repair scheduled on 04/20/2019 with Dr. Roxy Manns. Follow-up with Cardiology scheduled.   5. Essential hypertension Taking medications as directed. BP stable today. Asymptomatic. Continue current regimen.  6. Stage 3 chronic kidney disease ([place]) Patient to follow-up with Nephrology as scheduled. Will have her return to office for repeat BMP within the next week. She wants to call back and schedule.   7. Tobacco abuse disorder Has completed ceased tobacco use since discharge. Declines any further aid at present. States she is  determined not to restart. Will monitor closely.     [name], PA-C 03/28/2019

## 2019-03-29 ENCOUNTER — Other Ambulatory Visit: Payer: Self-pay

## 2019-03-29 ENCOUNTER — Encounter: Payer: Self-pay | Admitting: Medical

## 2019-03-29 ENCOUNTER — Other Ambulatory Visit: Payer: Self-pay | Admitting: *Deleted

## 2019-03-29 ENCOUNTER — Ambulatory Visit (HOSPITAL_COMMUNITY): Payer: Medicare Other

## 2019-03-29 ENCOUNTER — Ambulatory Visit (HOSPITAL_COMMUNITY)
Admission: RE | Admit: 2019-03-29 | Discharge: 2019-03-29 | Disposition: A | Discharge status: Home / Self Care | Payer: Medicare Other | Source: Ambulatory Visit | Attending: Thoracic Surgery (Cardiothoracic Vascular Surgery) | Admitting: Thoracic Surgery (Cardiothoracic Vascular Surgery)

## 2019-03-29 ENCOUNTER — Telehealth (INDEPENDENT_AMBULATORY_CARE_PROVIDER_SITE_OTHER): Payer: Medicare Other | Admitting: Medical

## 2019-03-29 VITALS — BP 110/84 | HR 87

## 2019-03-29 DIAGNOSIS — I5032 Chronic diastolic (congestive) heart failure: Secondary | ICD-10-CM

## 2019-03-29 DIAGNOSIS — E785 Hyperlipidemia, unspecified: Secondary | ICD-10-CM

## 2019-03-29 DIAGNOSIS — Z79899 Other long term (current) drug therapy: Secondary | ICD-10-CM

## 2019-03-29 DIAGNOSIS — I340 Nonrheumatic mitral (valve) insufficiency: Secondary | ICD-10-CM | POA: Diagnosis not present

## 2019-03-29 DIAGNOSIS — I10 Essential (primary) hypertension: Secondary | ICD-10-CM

## 2019-03-29 DIAGNOSIS — J988 Other specified respiratory disorders: Secondary | ICD-10-CM | POA: Diagnosis not present

## 2019-03-29 DIAGNOSIS — J449 Chronic obstructive pulmonary disease, unspecified: Secondary | ICD-10-CM

## 2019-03-29 DIAGNOSIS — I5030 Unspecified diastolic (congestive) heart failure: Secondary | ICD-10-CM | POA: Diagnosis not present

## 2019-03-29 DIAGNOSIS — I2510 Atherosclerotic heart disease of native coronary artery without angina pectoris: Secondary | ICD-10-CM

## 2019-03-29 DIAGNOSIS — I714 Abdominal aortic aneurysm, without rupture: Secondary | ICD-10-CM | POA: Diagnosis not present

## 2019-03-29 DIAGNOSIS — N183 Chronic kidney disease, stage 3 unspecified: Secondary | ICD-10-CM

## 2019-03-29 LAB — PULMONARY FUNCTION TEST
DL/VA % pred: 1 %
DL/VA: 0.05 ml/min/mmHg/L
DLCO unc % pred: 0 %
DLCO unc: 0.03 ml/min/mmHg
FEF 25-75 Post: 0.51 L/sec
FEF 25-75 Pre: 0.28 L/sec
FEF2575-%Change-Post: 83 %
FEF2575-%Pred-Post: 30 %
FEF2575-%Pred-Pre: 16 %
FEV1-%Change-Post: 29 %
FEV1-%Pred-Post: 42 %
FEV1-%Pred-Pre: 33 %
FEV1-Post: 0.84 L
FEV1-Pre: 0.65 L
FEV1FVC-%Change-Post: 6 %
FEV1FVC-%Pred-Pre: 69 %
FEV6-%Change-Post: 19 %
FEV6-%Pred-Post: 58 %
FEV6-%Pred-Pre: 49 %
FEV6-Post: 1.45 L
FEV6-Pre: 1.21 L
FEV6FVC-%Change-Post: -2 %
FEV6FVC-%Pred-Post: 101 %
FEV6FVC-%Pred-Pre: 104 %
FVC-%Change-Post: 21 %
FVC-%Pred-Post: 57 %
FVC-%Pred-Pre: 47 %
FVC-Post: 1.51 L
FVC-Pre: 1.24 L
Post FEV1/FVC ratio: 56 %
Post FEV6/FVC ratio: 96 %
Pre FEV1/FVC ratio: 52 %
Pre FEV6/FVC Ratio: 99 %
RV % pred: 231 %
RV: 4.77 L
TLC % pred: 135 %
TLC: 6.26 L

## 2019-03-29 MED ORDER — IOHEXOL 350 MG/ML SOLN
100.0000 mL | Freq: Once | INTRAVENOUS | Status: AC | PRN
  Administered 2019-03-29: 17:00:00 100 mL via INTRAVENOUS

## 2019-03-29 MED ORDER — ALBUTEROL SULFATE (2.5 MG/3ML) 0.083% IN NEBU
2.5000 mg | INHALATION_SOLUTION | Freq: Once | RESPIRATORY_TRACT | Status: AC
  Administered 2019-03-29: 2.5 mg via RESPIRATORY_TRACT
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
## 2019-03-30 ENCOUNTER — Other Ambulatory Visit: Payer: Self-pay | Admitting: Emergency Medicine

## 2019-03-30 ENCOUNTER — Other Ambulatory Visit: Payer: Self-pay

## 2019-03-30 ENCOUNTER — Telehealth: Payer: Self-pay | Admitting: Physician Assistant

## 2019-03-30 DIAGNOSIS — J441 Chronic obstructive pulmonary disease with (acute) exacerbation: Secondary | ICD-10-CM

## 2019-03-30 DIAGNOSIS — S32038D Other fracture of third lumbar vertebra, subsequent encounter for fracture with routine healing: Secondary | ICD-10-CM | POA: Diagnosis not present

## 2019-03-30 DIAGNOSIS — I10 Essential (primary) hypertension: Secondary | ICD-10-CM | POA: Diagnosis not present

## 2019-03-30 DIAGNOSIS — J449 Chronic obstructive pulmonary disease, unspecified: Secondary | ICD-10-CM | POA: Diagnosis not present

## 2019-03-30 DIAGNOSIS — M47812 Spondylosis without myelopathy or radiculopathy, cervical region: Secondary | ICD-10-CM | POA: Diagnosis not present

## 2019-03-30 DIAGNOSIS — M1611 Unilateral primary osteoarthritis, right hip: Secondary | ICD-10-CM | POA: Diagnosis not present

## 2019-03-30 DIAGNOSIS — E785 Hyperlipidemia, unspecified: Secondary | ICD-10-CM | POA: Diagnosis not present

## 2019-03-30 DIAGNOSIS — S92354D Nondisplaced fracture of fifth metatarsal bone, right foot, subsequent encounter for fracture with routine healing: Secondary | ICD-10-CM | POA: Diagnosis not present

## 2019-03-30 MED ORDER — FUROSEMIDE 40 MG PO TABS: 40.0000 mg | ORAL_TABLET | Freq: Every day | ORAL | 6 refills | Status: DC

## 2019-03-30 MED ORDER — ALBUTEROL SULFATE (2.5 MG/3ML) 0.083% IN NEBU: 2.5000 mg | INHALATION_SOLUTION | Freq: Four times a day (QID) | RESPIRATORY_TRACT | 3 refills | Status: DC | PRN

## 2019-03-30 NOTE — Telephone Encounter (Signed)
Pt states that she needs a refill on albuterol, sent to [facility] on [address]. Pt states that she has ran out and having issues with her breathing. Pt states that she needs this ASAP.

## 2019-03-30 NOTE — Telephone Encounter (Signed)
Spoke with patient and verified that she has restarted her Lasix and was out of her Albuterol nebulizer medication. She needed to do a treatment. She was outside on the porch. Advised not to be outside in the humidity heat. She was back in the house in cooler air. Rx for Albuterol sent to the [place]

## 2019-03-31 DIAGNOSIS — M1611 Unilateral primary osteoarthritis, right hip: Secondary | ICD-10-CM | POA: Diagnosis not present

## 2019-03-31 DIAGNOSIS — M47812 Spondylosis without myelopathy or radiculopathy, cervical region: Secondary | ICD-10-CM | POA: Diagnosis not present

## 2019-03-31 DIAGNOSIS — S92354D Nondisplaced fracture of fifth metatarsal bone, right foot, subsequent encounter for fracture with routine healing: Secondary | ICD-10-CM | POA: Diagnosis not present

## 2019-03-31 DIAGNOSIS — S32030A Wedge compression fracture of third lumbar vertebra, initial encounter for closed fracture: Secondary | ICD-10-CM | POA: Diagnosis not present

## 2019-03-31 DIAGNOSIS — I10 Essential (primary) hypertension: Secondary | ICD-10-CM | POA: Diagnosis not present

## 2019-03-31 DIAGNOSIS — J449 Chronic obstructive pulmonary disease, unspecified: Secondary | ICD-10-CM | POA: Diagnosis not present

## 2019-03-31 DIAGNOSIS — E785 Hyperlipidemia, unspecified: Secondary | ICD-10-CM | POA: Diagnosis not present

## 2019-03-31 DIAGNOSIS — S32038D Other fracture of third lumbar vertebra, subsequent encounter for fracture with routine healing: Secondary | ICD-10-CM | POA: Diagnosis not present

## 2019-04-01 NOTE — Pre-Procedure Instructions (Signed)
[place], [place] - [address] [facility] [place] [address] Phone: [phone] Fax: [phone]      Your procedure is scheduled on 04-07-19 Thursday  Report to [facility] Main Entrance "A" at 0530 A.M., and check in at the Admitting office.  Call this number if you have problems the morning of surgery:  [phone]  Call [phone] if you have any questions prior to your surgery date Monday-Friday 8am-4pm    Remember:  Do not eat or drink after midnight.  Take these medicines the morning of surgery with A SIP OF WATER : metoprolol tartrate (LOPRESSOR) mometasone-formoterol (DULERA) guaiFENesin (MUCINEX) acetaminophen (TYLENOL) as needed albuterol (PROVENTIL) as needed. Bring with you on day of surgery  7 days prior to surgery STOP taking any Aspirin (unless otherwise instructed by your surgeon), Aleve, Naproxen, Ibuprofen, Motrin, Advil, Goody's, BC's, all herbal medications, fish oil, and all vitamins.   Follow your surgeon's instructions on when to stop Aspirin.  If no instructions were given by your surgeon then you will need to call the office to get those instructions.    Follow your surgeons instructions on when to take or stop Prednisone.  The Morning of Surgery  Do not wear jewelry, make-up or nail polish.  Do not wear lotions, powders, or perfumes/colognes, or deodorant  Do not shave 48 hours prior to surgery.  Men may shave face and neck.  Do not bring valuables to the hospital.  [facility] is not responsible for any belongings or valuables.  If you are a smoker, DO NOT Smoke 24 hours prior to surgery IF you wear a CPAP at night please bring your mask, tubing, and machine the morning of surgery   Remember that you must have someone to transport you home after your surgery, and remain with you for 24 hours if you are discharged the same day.   Contacts, glasses, hearing aids, dentures or bridgework  may not be worn into surgery.    Leave your suitcase in the car.  After surgery it may be brought to your room.  For patients admitted to the hospital, discharge time will be determined by your treatment team.  Patients discharged the day of surgery will not be allowed to drive home.    Special instructions:   [facility]- Preparing For Surgery  Before surgery, you can play an important role. Because skin is not sterile, your skin needs to be as free of germs as possible. You can reduce the number of germs on your skin by washing with CHG (chlorahexidine gluconate) Soap before surgery.  CHG is an antiseptic cleaner which kills germs and bonds with the skin to continue killing germs even after washing.    Oral Hygiene is also important to reduce your risk of infection.  Remember - BRUSH YOUR TEETH THE MORNING OF SURGERY WITH YOUR REGULAR TOOTHPASTE  Please do not use if you have an allergy to CHG or antibacterial soaps. If your skin becomes reddened/irritated stop using the CHG.  Do not shave (including legs and underarms) for at least 48 hours prior to first CHG shower. It is OK to shave your face.  Please follow these instructions carefully.   1. Shower the NIGHT BEFORE SURGERY and the MORNING OF SURGERY with CHG Soap.   2. If you chose to wash your hair, wash your hair first as usual with your normal shampoo.  3. After you shampoo, rinse your hair and body thoroughly to  remove the shampoo.  4. Use CHG as you would any other liquid soap. You can apply CHG directly to the skin and wash gently with a scrungie or a clean washcloth.   5. Apply the CHG Soap to your body ONLY FROM THE NECK DOWN.  Do not use on open wounds or open sores. Avoid contact with your eyes, ears, mouth and genitals (private parts). Wash Face and genitals (private parts)  with your normal soap.   6. Wash thoroughly, paying special attention to the area where your surgery will be performed.  7. Thoroughly rinse  your body with warm water from the neck down.  8. DO NOT shower/wash with your normal soap after using and rinsing off the CHG Soap.  9. Pat yourself dry with a CLEAN TOWEL.  10. Wear CLEAN PAJAMAS to bed the night before surgery, wear comfortable clothes the morning of surgery  11. Place CLEAN SHEETS on your bed the night of your first shower and DO NOT SLEEP WITH PETS.    Day of Surgery:  Do not apply any deodorants/lotions.  Please wear clean clothes to the hospital/surgery center.   Remember to brush your teeth WITH YOUR REGULAR TOOTHPASTE.   Please read over the following fact sheets that you were given.

## 2019-04-04 ENCOUNTER — Encounter (HOSPITAL_COMMUNITY): Payer: Self-pay

## 2019-04-04 ENCOUNTER — Encounter: Payer: Self-pay | Admitting: Thoracic Surgery (Cardiothoracic Vascular Surgery)

## 2019-04-04 ENCOUNTER — Ambulatory Visit (HOSPITAL_COMMUNITY)
Admission: RE | Admit: 2019-04-04 | Discharge: 2019-04-04 | Disposition: A | Discharge status: Home / Self Care | Payer: Medicare Other | Source: Ambulatory Visit | Attending: Thoracic Surgery (Cardiothoracic Vascular Surgery) | Admitting: Thoracic Surgery (Cardiothoracic Vascular Surgery)

## 2019-04-04 ENCOUNTER — Encounter (HOSPITAL_COMMUNITY)
Admission: RE | Admit: 2019-04-04 | Discharge: 2019-04-04 | Disposition: A | Discharge status: Home / Self Care | Payer: Medicare Other | Source: Ambulatory Visit | Attending: Thoracic Surgery (Cardiothoracic Vascular Surgery) | Admitting: Thoracic Surgery (Cardiothoracic Vascular Surgery)

## 2019-04-04 ENCOUNTER — Ambulatory Visit: Payer: Medicare Other | Admitting: Thoracic Surgery (Cardiothoracic Vascular Surgery)

## 2019-04-04 ENCOUNTER — Other Ambulatory Visit: Payer: Self-pay

## 2019-04-04 ENCOUNTER — Other Ambulatory Visit: Payer: Self-pay | Admitting: *Deleted

## 2019-04-04 ENCOUNTER — Other Ambulatory Visit (HOSPITAL_COMMUNITY)
Admission: RE | Admit: 2019-04-04 | Discharge: 2019-04-04 | Disposition: A | Discharge status: Home / Self Care | Payer: Medicare Other | Source: Ambulatory Visit | Attending: Thoracic Surgery (Cardiothoracic Vascular Surgery) | Admitting: Thoracic Surgery (Cardiothoracic Vascular Surgery)

## 2019-04-04 VITALS — BP 101/68 | HR 74 | Temp 97.7°F | Resp 16 | Ht 61.0 in | Wt 143.0 lb

## 2019-04-04 DIAGNOSIS — I340 Nonrheumatic mitral (valve) insufficiency: Secondary | ICD-10-CM

## 2019-04-04 DIAGNOSIS — Z01810 Encounter for preprocedural cardiovascular examination: Secondary | ICD-10-CM

## 2019-04-04 DIAGNOSIS — Z1159 Encounter for screening for other viral diseases: Secondary | ICD-10-CM | POA: Diagnosis not present

## 2019-04-04 DIAGNOSIS — R918 Other nonspecific abnormal finding of lung field: Secondary | ICD-10-CM | POA: Diagnosis not present

## 2019-04-04 DIAGNOSIS — J189 Pneumonia, unspecified organism: Secondary | ICD-10-CM | POA: Insufficient documentation

## 2019-04-04 DIAGNOSIS — I517 Cardiomegaly: Secondary | ICD-10-CM | POA: Diagnosis not present

## 2019-04-04 HISTORY — DX: Respiratory tuberculosis unspecified: A15.9

## 2019-04-04 LAB — COMPREHENSIVE METABOLIC PANEL
ALT: 24 U/L (ref 0–44)
AST: 19 U/L (ref 15–41)
Albumin: 3.3 g/dL — ABNORMAL LOW (ref 3.5–5.0)
Alkaline Phosphatase: 68 U/L (ref 38–126)
Anion gap: 13 (ref 5–15)
BUN: 32 mg/dL — ABNORMAL HIGH (ref 8–23)
CO2: 21 mmol/L — ABNORMAL LOW (ref 22–32)
Calcium: 9 mg/dL (ref 8.9–10.3)
Chloride: 105 mmol/L (ref 98–111)
Creatinine, Ser: 1.38 mg/dL — ABNORMAL HIGH (ref 0.44–1.00)
GFR calc Af Amer: 44 mL/min — ABNORMAL LOW (ref >60)
GFR calc non Af Amer: 38 mL/min — ABNORMAL LOW (ref >60)
Glucose, Bld: 107 mg/dL — ABNORMAL HIGH (ref 70–99)
Potassium: 3.8 mmol/L (ref 3.5–5.1)
Sodium: 139 mmol/L (ref 135–145)
Total Bilirubin: 0.7 mg/dL (ref 0.3–1.2)
Total Protein: 6.3 g/dL — ABNORMAL LOW (ref 6.5–8.1)

## 2019-04-04 LAB — URINALYSIS, ROUTINE W REFLEX MICROSCOPIC
Bilirubin Urine: NEGATIVE
Glucose, UA: NEGATIVE mg/dL
Hgb urine dipstick: NEGATIVE
Ketones, ur: NEGATIVE mg/dL
Leukocytes,Ua: NEGATIVE
Nitrite: NEGATIVE
Protein, ur: NEGATIVE mg/dL
Specific Gravity, Urine: 1.005 (ref 1.005–1.030)
pH: 6 (ref 5.0–8.0)

## 2019-04-04 LAB — TYPE AND SCREEN
ABO/RH(D): A POS
Antibody Screen: NEGATIVE

## 2019-04-04 LAB — BLOOD GAS, ARTERIAL
Acid-Base Excess: 1.6 mmol/L (ref 0.0–2.0)
Bicarbonate: 25.1 mmol/L (ref 20.0–28.0)
Drawn by: 470591
FIO2: 21
O2 Saturation: 98.9 %
Patient temperature: 98.6
pCO2 arterial: 35.9 mmHg (ref 32.0–48.0)
pH, Arterial: 7.459 — ABNORMAL HIGH (ref 7.350–7.450)
pO2, Arterial: 120 mmHg — ABNORMAL HIGH (ref 83.0–108.0)

## 2019-04-04 LAB — CBC
HCT: 33 % — ABNORMAL LOW (ref 36.0–46.0)
Hemoglobin: 10.1 g/dL — ABNORMAL LOW (ref 12.0–15.0)
MCH: 30.1 pg (ref 26.0–34.0)
MCHC: 30.6 g/dL (ref 30.0–36.0)
MCV: 98.2 fL (ref 80.0–100.0)
Platelets: 195 10*3/uL (ref 150–400)
RBC: 3.36 MIL/uL — ABNORMAL LOW (ref 3.87–5.11)
RDW: 14.7 % (ref 11.5–15.5)
WBC: 13.3 10*3/uL — ABNORMAL HIGH (ref 4.0–10.5)
nRBC: 0 % (ref 0.0–0.2)

## 2019-04-04 LAB — SURGICAL PCR SCREEN
MRSA, PCR: NEGATIVE
Staphylococcus aureus: NEGATIVE

## 2019-04-04 LAB — HEMOGLOBIN A1C
Hgb A1c MFr Bld: 5.2 % (ref 4.8–5.6)
Mean Plasma Glucose: 102.54 mg/dL

## 2019-04-04 LAB — PROTIME-INR
INR: 0.9 (ref 0.8–1.2)
Prothrombin Time: 12.4 seconds (ref 11.4–15.2)

## 2019-04-04 LAB — APTT: aPTT: 30 seconds (ref 24–36)

## 2019-04-04 LAB — ABO/RH: ABO/RH(D): A POS
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
## 2019-04-04 NOTE — Progress Notes (Signed)
Pre op Duplex studies       have been completed. Preliminary results can be found under CV proc through chart review. June Leap, BS, RDMS, RVT

## 2019-04-04 NOTE — Progress Notes (Addendum)
[address]       [place] [address]             [phone]     CARDIOTHORACIC SURGERY OFFICE NOTE  Referring Provider is [name], MD Primary Cardiologist is Skeet Latch, MD PCP is Brunetta Jeans, PA-C   HPI:  Patient is a 73 year old female with history of mitral valve prolapse with mitral regurgitation, hypertension, abdominal aortic aneurysm, COPD, and longstanding tobacco abuse who returns to the office today with tentative plans to proceed with mitral valve repair later this week.  She was originally seen in consultation on March 25, 2019.  Since then she underwent pulmonary function testing and CT angiography.  She reports no new problems or complaints.  She has continued to abstain from any tobacco use and reports that it has been well over 3 weeks since she last had a cigarette.  Her cough has improved considerably although it has not completely resolved.  She is completing a prednisone taper.  She was seen by her orthopedic surgeon last week and she no longer has to wear a back brace.  She is ambulating fairly well in a walking boot.  She does use a rolling walker for assistance because she gets out of breath and tired easily.  She denies any fevers or chills.  The remainder of her review of systems is unchanged from previously   Current Outpatient Medications  Medication Sig Dispense Refill   acetaminophen (TYLENOL) 325 MG tablet Take 2 tablets (650 mg total) by mouth every 6 (six) hours as needed. (Patient taking differently: Take 650 mg by mouth every 6 (six) hours as needed (pain.). )     Acetaminophen-Codeine (TYLENOL/CODEINE #3) 300-30 MG tablet Take 1 tablet by mouth every 4 (four) hours as needed for pain. (Patient taking differently: Take 1 tablet by mouth 2 (two) times daily as needed for pain. ) 30 tablet 0   albuterol (PROVENTIL) (2.5 MG/3ML) 0.083% nebulizer solution Take 3 mLs (2.5 mg total) by nebulization every 6 (six)  hours as needed for wheezing or shortness of breath. 150 mL 3   atorvastatin (LIPITOR) 40 MG tablet Take 1 tablet (40 mg total) by mouth daily at 6 PM. 90 tablet 1   furosemide (LASIX) 40 MG tablet Take 1 tablet (40 mg total) by mouth daily. 30 tablet 6   guaiFENesin (MUCINEX) 600 MG 12 hr tablet Take 1 tablet (600 mg total) by mouth every 4 (four) hours as needed for up to 30 days for cough or to loosen phlegm. (Patient taking differently: Take 600 mg by mouth daily. ) 60 tablet 0   metoprolol tartrate (LOPRESSOR) 25 MG tablet Take 1 tablet (25 mg total) by mouth 2 (two) times daily. 60 tablet 0   Misc. Devices (PULSE OXIMETER FOR FINGER) MISC 1 Device by Does not apply route as needed. 1 each 0   mometasone-formoterol (DULERA) 200-5 MCG/ACT AERO Inhale 2 puffs into the lungs 2 (two) times daily.     predniSONE (DELTASONE) 10 MG tablet Take 4 tablets for 1 week followed by 3 tablets p.o. for 1 week followed by 2 tablets for 1 week followed by 1 tablet for 1 week. (Patient taking differently: Take 10-40 mg by mouth See admin instructions. Take 4 tablets for 1 week followed by 3 tablets p.o. for 1 week followed by 2 tablets for 1 week followed by 1 tablet for 1 week.) 70 tablet 0   aspirin EC  81 MG tablet Take 1 tablet (81 mg total) by mouth daily. (Patient not taking: Reported on 03/30/2019) 30 tablet 0   fenofibrate 160 MG tablet Take 1 tablet (160 mg total) by mouth daily. (Patient not taking: Reported on 03/30/2019) 90 tablet 1   losartan (COZAAR) 100 MG tablet Take 1 tablet (100 mg total) by mouth daily. (Patient not taking: Reported on 04/04/2019) 90 tablet 0   No current facility-administered medications for this visit.       Physical Exam:   BP 101/68 (BP Location: Left Arm, Patient Position: Sitting, Cuff Size: Normal)    Pulse 74    Temp 97.7 F (36.5 C) (Other (Comment)) Comment (Src): THERMAL   Resp 16    Ht 5\' 1"  (1.549 m)    Wt 143 lb (64.9 kg)    SpO2 95% Comment: RA   BMI  27.02 kg/m   General:  Well-appearing  Chest:   Clear to auscultation  CV:   Regular rate and rhythm with prominent holosystolic murmur  Incisions:  n/a  Abdomen:  Soft nontender  Extremities:  Warm and well-perfused, no edema  Diagnostic Tests:  Pulmonary Function Tests  Baseline      Post-bronchodilator  FVC  1.24 L  (47% predicted) FVC  1.51 L  (57% predicted) FEV1  0.65 L  (33% predicted) FEV1  0.84 L  (42% predicted) FEF25-75 0.28 L  (16% predicted) FEF25-75 0.51 L  (30% predicted)  TLC  6.26 L  (135% predicted) RV  4.77 L  (231% predicted) DLCO  N/A % predicted    CT ANGIOGRAPHY CHEST, ABDOMEN AND PELVIS  TECHNIQUE: Multidetector CT imaging through the chest, abdomen and pelvis was performed using the standard protocol during bolus administration of intravenous contrast. Multiplanar reconstructed images and MIPs were obtained and reviewed to evaluate the vascular anatomy.  CONTRAST:  141mL OMNIPAQUE IOHEXOL 350 MG/ML SOLN  COMPARISON:  Chest CT from 05/24/2018 and chest radiograph from 03/14/2019  FINDINGS: CTA CHEST FINDINGS  Cardiovascular: Noncontrast images demonstrate atherosclerotic calcification aortic arch, branch vessels, and left anterior descending coronary artery but no findings of acute intramural hematoma. Low-density blood pool suggests anemia.  Postcontrast images demonstrate no findings of thoracic dissection. Mild cardiomegaly is present and there is a suggestion of some thickening of the mitral valve leaflets. We have a reasonably well opacified pulmonary arterial tree without findings of filling defect in the pulmonary arteries. No pericardial effusion.  Mediastinum/Nodes: A hypodense structure in the periaortic region measuring 0.9 cm in short axis on image 94/7 probably represents a lymph node. There is some trace right paraesophageal fluid which could be in the pleural space but also could be in the mediastinum just above the  hiatus.  Lungs/Pleura: Small bilateral pleural effusions with associated passive atelectasis. These effusions are nonspecific for transudative or exudative etiology.  Biapical scarring is essentially stable from 05/24/2018. There are some indistinctly marginated ground-glass opacities in both lungs suggesting mild edema. This is most confluent in the superior segment right lower lobe for example on image 62/9. Mild bilateral airway thickening. Suspected centrilobular emphysema.  Musculoskeletal: Unremarkable  Review of the MIP images confirms the above findings.  CTA ABDOMEN AND PELVIS FINDINGS  VASCULAR  Aorta: Fusiform infrarenal abdominal aortic aneurysm 3.8 cm in diameter on image 158/7 with associated mural thrombus. Abdominal aortic ectasia extends down to the bifurcation.  Celiac: Patent celiac artery.  SMA: Patent. The common hepatic artery arises from the SMA, a vascular variant.  Renals: Patent single left  renal artery.  Right nephrectomy.  IMA: Patent.  Inflow: Atheromatous common iliac arteries but widely patent.  Veins: No appreciable abnormality.  Review of the MIP images confirms the above findings.  NON-VASCULAR  Hepatobiliary: 4.1 by 2.8 cm hypodense lesion in the dome of the right hepatic lobe on image 115/7, this previously measured 3.2 by 2.4 cm on 01/21/2010 and has a cystic appearance. A smaller cyst in the lateral segment left hepatic lobe measures 1.1 cm in diameter, essentially stable from 01/21/2010. A hypodense lesion inferiorly in the lateral segment left hepatic lobe measures 1.1 cm in diameter on image 53/5, formerly 1.0 cm in diameter back in 2011. Other small hypodense liver lesions likewise are statistically likely to be small cysts or similar benign lesions, although technically nonspecific. A 1.1 by 1.0 cm hypodense lesion in the right hepatic lobe on image 152/7 is enlarged compared to the 2011 exam.  The  gallbladder is absent. Mild prominence of the extrahepatic biliary tree is likely from physiologic response to cholecystectomy.  Pancreas: Slight lobularity of the posterior margin of the pancreatic tail not changed from 2011, considered benign. I suspect pancreas divisum.  Spleen: Unremarkable  Adrenals/Urinary Tract: Poor definition of the right adrenal gland. Right nephrectomy noted. The left adrenal gland appears normal. No left renal abnormality is identified.  Stomach/Bowel: Scattered descending colon diverticula. Upper normal diameter small bowel loops with scattered air-fluid levels. I do not see an obvious lead point for obstruction.  Lymphatic: Unremarkable  Reproductive: Unremarkable  Other: No supplemental non-categorized findings.  Musculoskeletal: 60% anterior wedge compression fracture at L3, age indeterminate.  Review of the MIP images confirms the above findings.  IMPRESSION: 1. No dissection or compelling findings of pulmonary embolus. 2. Infrarenal abdominal aortic aneurysm 3.8 cm in diameter. Recommend followup by ultrasound in 2 years. This recommendation follows ACR consensus guidelines: White Paper of the ACR Incidental Findings Committee II on Vascular Findings. J Am Coll Radiol 2013; 10:789-794. Aortic aneurysm NOS (ICD10-I71.9). 3. Mild cardiomegaly with a suggestion of thickening of the mitral valve leaflets. 4. Small bilateral pleural effusions with associated passive atelectasis. 5. Patchy ground-glass opacities in the lungs, indistinctly marginated, most confluent in the superior segment right lower lobe. Given the hazy background lung opacity these may reflect pulmonary edema. 6. Airway thickening is present, suggesting bronchitis or reactive airways disease. 7. Suspected centrilobular emphysema.  Emphysema (ICD10-J43.9). 8.  Aortic Atherosclerosis (ICD10-I70.0). 9. Hypodense liver lesions are likely cysts and for the most  part are stable from 2011. A 1.1 cm hypodense lesion in the right hepatic lobe has enlarged compared to the 2011 exam but is still most likely a cyst. 10. Right nephrectomy. 11. 60% anterior wedge compression fracture at L3, age indeterminate.   Electronically Signed   By: Van Clines M.D.   On: 03/29/2019 17:42   Impression:  Patient has mitral valve prolapse with stage D severe symptomatic primary mitral regurgitation.  She presents with recent exacerbation of symptoms of exertional shortness of breath and occasional resting shortness of breath witn orthopnea consistent with acute on chronic diastolic congestive heart failure, New [place] Heart Association functional class III-IV.  She was also treated recently for presumed acute exacerbation of chronic bronchitis.  Although her cough is improved she has remained quite limited since hospital discharge with at least class III symptoms of dyspnea.  I have personally reviewed the patient's recent echocardiograms, diagnostic cardiac catheterization, PFT's and CT angiogram.  The patient has mitral valve prolapse with severe prolapse involving a  portion of the middle scallop of the posterior leaflet.  There are ruptured primary chordae tendinae and a flail segment of the posterior leaflet causing severe mitral regurgitation.  Left ventricular systolic function appears normal.  Diagnostic cardiac catheterization is notable for the absence of significant coronary artery disease and right heart pressures remain normal.  PFTs reveal findings consistent with moderate to severe COPD.  There was hyperinflation and improvement in airway flow following bronchodilator administration.  CT angiography revealed parenchymal changes in the lung consistent with emphysema, a small right pleural effusion, and no contraindications to peripheral cannulation for surgery, although there is a small infrarenal abdominal aortic aneurysm without any complicating  features.    Plan:  The patient was again counseled at length regarding the indications, risks and potential benefits of mitral valve repair.  The rationale for elective surgery has been explained, including a comparison between surgery and continued medical therapy with close follow-up.  The likelihood of successful and durable mitral valve repair has been discussed with particular reference to the findings of their recent echocardiogram.  Based upon these findings and previous experience, I have quoted them a greater than 95 percent likelihood of successful valve repair with less than 1 percent risk of mortality or major morbidity.  She does understand that she will be at increased risk for the development of postoperative pneumonia and/or respiratory failure.  Alternative surgical approaches have been discussed including a comparison between conventional sternotomy and minimally-invasive techniques.  The relative risks and benefits of each have been reviewed as they pertain to the patient's specific circumstances, and expectations for the patient's postoperative convalescence has been discussed.  We also discussed the presence of her abdominal aortic aneurysm and potential risks related to the use of peripheral cannulation for surgery.  The patient desires to proceed with minimally invasive mitral valve repair this week.    The patient understands and accepts all potential risks of surgery including but not limited to risk of death, stroke or other neurologic complication, myocardial infarction, congestive heart failure, respiratory failure, renal failure, bleeding requiring transfusion and/or reexploration, arrhythmia, infection or other wound complications, pneumonia, pleural and/or pericardial effusion, pulmonary embolus, aortic dissection or other major vascular complication, or delayed complications related to valve repair or replacement including but not limited to structural valve deterioration and  failure, thrombosis, embolization, endocarditis, or paravalvular leak.  Specific risks potentially related to the minimally-invasive approach were discussed at length, including but not limited to risk of conversion to full or partial sternotomy, aortic dissection or other major vascular complication, unilateral acute lung injury or pulmonary edema, phrenic nerve dysfunction or paralysis, rib fracture, chronic pain, lung hernia, or lymphocele. All of her questions have been answered.    I spent in excess of 15 minutes during the conduct of this office consultation and >50% of this time involved direct face-to-face encounter with the patient for counseling and/or coordination of their care.    Valentina Gu. Roxy Manns, MD 04/04/2019 9:17 AM    Review of the patient's preoperative blood work demonstrates leukocytosis with white blood count reported 13,300.  I suspect this is related to the patient's recent acute exacerbation of chronic bronchitis and steroid taper.  Because the patient still has a cough productive of blood-tinged sputum which reportedly has improved but not completely resolved, I suspect it would be wise to postpone surgery for at least a couple of weeks with hope that the patient's bronchitis will resolve completely.  I discussed this over the telephone with the  patient this afternoon.  We will make tentative plans for surgery on April 20, 2019.  The patient will return to our office for follow-up on April 18, 2019.    Rexene Alberts, MD 04/04/2019 12:26 PM

## 2019-04-04 NOTE — Patient Instructions (Addendum)
Stop taking aspirin  Continue taking all other current medications without change through the day before surgery.  Have nothing to eat or drink after midnight the night before surgery.  On the morning of surgery take only metoprolol with a sip of water.

## 2019-04-05 ENCOUNTER — Encounter: Payer: Self-pay | Admitting: *Deleted

## 2019-04-05 DIAGNOSIS — J449 Chronic obstructive pulmonary disease, unspecified: Secondary | ICD-10-CM | POA: Diagnosis not present

## 2019-04-05 DIAGNOSIS — M47812 Spondylosis without myelopathy or radiculopathy, cervical region: Secondary | ICD-10-CM | POA: Diagnosis not present

## 2019-04-05 DIAGNOSIS — S32038D Other fracture of third lumbar vertebra, subsequent encounter for fracture with routine healing: Secondary | ICD-10-CM | POA: Diagnosis not present

## 2019-04-05 DIAGNOSIS — M1611 Unilateral primary osteoarthritis, right hip: Secondary | ICD-10-CM | POA: Diagnosis not present

## 2019-04-05 DIAGNOSIS — I10 Essential (primary) hypertension: Secondary | ICD-10-CM | POA: Diagnosis not present

## 2019-04-05 DIAGNOSIS — S92354D Nondisplaced fracture of fifth metatarsal bone, right foot, subsequent encounter for fracture with routine healing: Secondary | ICD-10-CM | POA: Diagnosis not present

## 2019-04-05 DIAGNOSIS — E785 Hyperlipidemia, unspecified: Secondary | ICD-10-CM | POA: Diagnosis not present

## 2019-04-05 NOTE — Anesthesia Preprocedure Evaluation (Addendum)
Anesthesia Evaluation  Patient identified by MRN, date of birth, ID band Patient awake    Reviewed: Allergy & Precautions, H&P , NPO status , Patient's Chart, lab work & pertinent test results  Airway Mallampati: II   Neck ROM: full    Dental   Pulmonary shortness of breath, COPD, former smoker,    breath sounds clear to auscultation       Cardiovascular hypertension, +CHF  + Valvular Problems/Murmurs MR  Rhythm:regular Rate:Normal  AAA   Neuro/Psych PSYCHIATRIC DISORDERS Anxiety Depression    GI/Hepatic GERD  ,  Endo/Other    Renal/GU Renal diseaseS/p nephrectomy for RCC     Musculoskeletal  (+) Arthritis ,   Abdominal   Peds  Hematology  (+) anemia ,   Anesthesia Other Findings   Reproductive/Obstetrics                            Anesthesia Physical Anesthesia Plan  ASA: III  Anesthesia Plan: General   Post-op Pain Management:    Induction: Intravenous  PONV Risk Score and Plan: 3 and Ondansetron, Dexamethasone, Midazolam and Treatment may vary due to age or medical condition  Airway Management Planned: Oral ETT and Double Lumen EBT  Additional Equipment: Arterial line, CVP, PA Cath, TEE, 3D TEE and Ultrasound Guidance Line Placement  Intra-op Plan:   Post-operative Plan: Post-operative intubation/ventilation  Informed Consent:   Plan Discussed with:   Anesthesia Plan Comments: (PFTs 03/29/19 consistent with mod-severe COPD: FVC-Pre: 1.24 FVC-%Pred-Pre: 47 FEV1-Pre: 0.65 FEV1-%Pred-Pre: 33 Pre FEV1/FVC ratio: 52 FEV1FVC-%Pred-Pre: 69  TLC: 6.26 TLC % pred: 135 RV: 4.77 RV % pred: 231 DLCO unc: 0.03 DLCO unc % pred: 0 )       Anesthesia Quick Evaluation

## 2019-04-06 DIAGNOSIS — S92351D Displaced fracture of fifth metatarsal bone, right foot, subsequent encounter for fracture with routine healing: Secondary | ICD-10-CM | POA: Diagnosis not present

## 2019-04-06 LAB — NOVEL CORONAVIRUS, NAA (HOSP ORDER, SEND-OUT TO REF LAB; TAT 18-24 HRS): SARS-CoV-2, NAA: NOT DETECTED

## 2019-04-07 ENCOUNTER — Telehealth: Payer: Self-pay | Admitting: Emergency Medicine

## 2019-04-07 DIAGNOSIS — S92354D Nondisplaced fracture of fifth metatarsal bone, right foot, subsequent encounter for fracture with routine healing: Secondary | ICD-10-CM | POA: Diagnosis not present

## 2019-04-07 DIAGNOSIS — J449 Chronic obstructive pulmonary disease, unspecified: Secondary | ICD-10-CM | POA: Diagnosis not present

## 2019-04-07 DIAGNOSIS — S32038D Other fracture of third lumbar vertebra, subsequent encounter for fracture with routine healing: Secondary | ICD-10-CM | POA: Diagnosis not present

## 2019-04-07 DIAGNOSIS — I10 Essential (primary) hypertension: Secondary | ICD-10-CM | POA: Diagnosis not present

## 2019-04-07 DIAGNOSIS — M47812 Spondylosis without myelopathy or radiculopathy, cervical region: Secondary | ICD-10-CM | POA: Diagnosis not present

## 2019-04-07 DIAGNOSIS — M1611 Unilateral primary osteoarthritis, right hip: Secondary | ICD-10-CM | POA: Diagnosis not present

## 2019-04-07 DIAGNOSIS — E785 Hyperlipidemia, unspecified: Secondary | ICD-10-CM | POA: Diagnosis not present

## 2019-04-07 NOTE — Telephone Encounter (Signed)
Spoke with patient about her hands. She is having cramping in hands. Occurs in the afternoon. Hands are looking deformed. Having a lot of pain in hands. Occurring in feet. Encouraged to increase hydration. She does take Lasix and Losartan.   She requested a refill of the Tylenol with Codeine at bedtime to help with sleep and pain.    Copied from [facility] [phone]. Topic: General - Other >> Apr 06, 2019  5:52 PM Mcneil, Ja-Kwan wrote: Reason for CRM: Pt stated both her hands are starting to lock up and look deformed. Pt requests a call back

## 2019-04-07 NOTE — Telephone Encounter (Signed)
Appointment has been scheduled for tomorrow at 11:30

## 2019-04-07 NOTE — Telephone Encounter (Signed)
Would recommend we schedule her for assessment. Would need assessment of electrolytes and inflammatory causes of symptoms versus structural issue (trigger fingers, etc). Can be VV with follow-up lab visit or in-office visit.

## 2019-04-08 ENCOUNTER — Encounter: Payer: Self-pay | Admitting: Physician Assistant

## 2019-04-08 ENCOUNTER — Ambulatory Visit (INDEPENDENT_AMBULATORY_CARE_PROVIDER_SITE_OTHER): Payer: Medicare Other | Admitting: Physician Assistant

## 2019-04-08 ENCOUNTER — Other Ambulatory Visit: Payer: Self-pay

## 2019-04-08 VITALS — BP 98/70 | HR 79 | Temp 97.3°F | Resp 16 | Ht 61.0 in | Wt 143.0 lb

## 2019-04-08 DIAGNOSIS — R252 Cramp and spasm: Secondary | ICD-10-CM | POA: Diagnosis not present

## 2019-04-08 LAB — BASIC METABOLIC PANEL
BUN: 31 mg/dL — ABNORMAL HIGH (ref 6–23)
CO2: 27 mEq/L (ref 19–32)
Calcium: 9.1 mg/dL (ref 8.4–10.5)
Chloride: 98 mEq/L (ref 96–112)
Creatinine, Ser: 1.29 mg/dL — ABNORMAL HIGH (ref 0.40–1.20)
GFR: 40.71 mL/min — ABNORMAL LOW (ref >60.00)
Glucose, Bld: 88 mg/dL (ref 70–99)
Potassium: 3.9 mEq/L (ref 3.5–5.1)
Sodium: 139 mEq/L (ref 135–145)

## 2019-04-08 NOTE — Patient Instructions (Signed)
Please keep hydrated. I would recommend a small electrolyte water daily until we get your lab results back.  We will alter your regimen according to results.   Make sure to use you maintenance inhaler as directed daily.  Follow-up with specialists as scheduled.

## 2019-04-08 NOTE — Progress Notes (Signed)
Patient presents to clinic today c/o 1 wk history of bilateral hand cramping causing finger flexion around 3-4 o'clock. Also notes similar symptoms on occasion in Left foot. Notes that her episoides last 30 min, movement helps. Has been taking her daily Lasix, noting some increased urination. Trying to stay hydrated with water, does consume sweet tea and soda  Past Medical History:  Diagnosis Date  . AAA (abdominal aortic aneurysm) ([place])   . Arthritis   . Cholelithiasis 2011   s/p cholescystectomy   . COPD (chronic obstructive pulmonary disease) ([place])   . Emphysema   . GERD (gastroesophageal reflux disease)   . History of heartburn   . History of renal cell carcinoma   . Hypercholesteremia   . Hypertension   . Hypertension   . Mitral regurgitation   . Pneumonia 1980's  . Renal cell carcinoma 01/2010   s/p right radical nephrectomy 01/2010,  [facility]  . Shortness of breath   . Tuberculosis    Tests positive for PPD. dad had h/o TB.    Current Outpatient Medications on File Prior to Visit  Medication Sig Dispense Refill  . acetaminophen (TYLENOL) 325 MG tablet Take 2 tablets (650 mg total) by mouth every 6 (six) hours as needed. (Patient taking differently: Take 650 mg by mouth every 6 (six) hours as needed (pain.). )    . Acetaminophen-Codeine (TYLENOL/CODEINE #3) 300-30 MG tablet Take 1 tablet by mouth every 4 (four) hours as needed for pain. (Patient taking differently: Take 1 tablet by mouth 2 (two) times daily as needed for pain. ) 30 tablet 0  . albuterol (PROVENTIL) (2.5 MG/3ML) 0.083% nebulizer solution Take 3 mLs (2.5 mg total) by nebulization every 6 (six) hours as needed for wheezing or shortness of breath. 150 mL 3  . aspirin EC 81 MG tablet Take 1 tablet (81 mg total) by mouth daily. 30 tablet 0  . atorvastatin (LIPITOR) 40 MG tablet Take 1 tablet (40 mg total) by mouth daily at 6 PM. 90 tablet 1  . furosemide (LASIX) 40 MG tablet Take 1 tablet  (40 mg total) by mouth daily. 30 tablet 6  . guaiFENesin (MUCINEX) 600 MG 12 hr tablet Take 1 tablet (600 mg total) by mouth every 4 (four) hours as needed for up to 30 days for cough or to loosen phlegm. (Patient taking differently: Take 600 mg by mouth daily. ) 60 tablet 0  . metoprolol tartrate (LOPRESSOR) 25 MG tablet Take 1 tablet (25 mg total) by mouth 2 (two) times daily. 60 tablet 0  . Misc. Devices (PULSE OXIMETER FOR FINGER) MISC 1 Device by Does not apply route as needed. 1 each 0  . mometasone-formoterol (DULERA) 200-5 MCG/ACT AERO Inhale 2 puffs into the lungs 2 (two) times daily.    . predniSONE (DELTASONE) 10 MG tablet Take 4 tablets for 1 week followed by 3 tablets p.o. for 1 week followed by 2 tablets for 1 week followed by 1 tablet for 1 week. (Patient taking differently: Take 10-40 mg by mouth See admin instructions. Take 4 tablets for 1 week followed by 3 tablets p.o. for 1 week followed by 2 tablets for 1 week followed by 1 tablet for 1 week.) 70 tablet 0  . fenofibrate 160 MG tablet Take 1 tablet (160 mg total) by mouth daily. (Patient not taking: Reported on 04/08/2019) 90 tablet 1  . losartan (COZAAR) 100 MG tablet Take 1 tablet (100 mg total) by mouth daily. (Patient not  taking: Reported on 04/08/2019) 90 tablet 0   No current facility-administered medications on file prior to visit.     Allergies  Allergen Reactions  . Ace Inhibitors Other (See Comments)    Pseudoasthma/ renal failure (PATIENT DENIES THIS ALLERGY)    Family History  Problem Relation Age of Onset  . COPD Mother        DECEASED/SMOKED  . Diabetes Brother   . Diabetes Brother     Social History   Socioeconomic History  . Marital status: Married    Spouse name: Not on file  . Number of children: 3  . Years of education: 5  . Highest education level: Not on file  Occupational History  . 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 Financial resource strain: Not on file  . Food insecurity    Worry: Not on file    Inability: Not on file  . Transportation needs    Medical: Not on file    Non-medical: Not on file  Tobacco Use  . Smoking status: Former Smoker    Packs/day: 0.50    Years: 53.00    Pack years: 26.50    Types: Cigarettes    Quit date: 03/18/2019    Years since quitting: 0.0  . Smokeless tobacco: Never Used  Substance and Sexual Activity  . Alcohol use: Yes    Alcohol/week: 0.0 standard drinks    Comment:  "drink very rarely"  . Drug use: No    Types: Cocaine    Comment: hx of cocaine last in 2006  . Sexual activity: Never  Lifestyle  . Physical activity    Days per week: Not on file    Minutes per session: Not on file  . Stress: Not on file  Relationships  . Social Herbalist on phone: Not on file    Gets together: Not on file    Attends religious service: Not on file    Active member of club or organization: Not on file    Attends meetings of clubs or organizations: Not on file    Relationship status: Not on file  Other Topics Concern  . Not on file  Social History Narrative          Lives in [place] with her husband.    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    Patient has 3 grown children that do not live with her.    Patient does not have any medical insurance.    Review of Systems - See HPI.  All other ROS are negative.  BP 98/70   Pulse 79   Temp (!) 97.3 F (36.3 C) (Skin)   Resp 16   Ht 5\' 1"  (1.549 m)   Wt 143 lb (64.9 kg)   SpO2 98%   BMI 27.02 kg/m   Physical Exam Vitals signs reviewed.  Constitutional:      Appearance: Normal appearance.  HENT:     Head: Normocephalic and atraumatic.  Neck:     Musculoskeletal: Neck supple.  Cardiovascular:     Rate and Rhythm: Normal rate and regular rhythm.     Heart sounds: Murmur (Murmur of mitral valve regurgitation noted -- chronic.) present.   Musculoskeletal: Normal range of motion.        General: No swelling.  Neurological:     General: No focal deficit present.  Mental Status: She is alert and oriented to person, place, and time.     Recent Results (from the past 2160 hour(s))  CDS serology     Status: None   Collection Time: 03/06/19  6:00 PM  Result Value Ref Range   CDS serology specimen      SPECIMEN WILL BE HELD FOR 14 DAYS IF TESTING IS REQUIRED    Comment: SPECIMEN WILL BE HELD FOR 14 DAYS IF TESTING IS REQUIRED Performed at [facility], [place] [address], [place], [place] [address]   Comprehensive metabolic panel     Status: Abnormal   Collection Time: 03/06/19  6:00 PM  Result Value Ref Range   Sodium 139 135 - 145 mmol/L   Potassium 4.0 3.5 - 5.1 mmol/L   Chloride 106 98 - 111 mmol/L   CO2 23 22 - 32 mmol/L   Glucose, Bld 114 (H) 70 - 99 mg/dL   BUN 17 8 - 23 mg/dL   Creatinine, Ser 1.60 (H) 0.44 - 1.00 mg/dL   Calcium 9.4 8.9 - 10.3 mg/dL   Total Protein 6.6 6.5 - 8.1 g/dL   Albumin 3.4 (L) 3.5 - 5.0 g/dL   AST 21 15 - 41 U/L   ALT 15 0 - 44 U/L   Alkaline Phosphatase 81 38 - 126 U/L   Total Bilirubin 0.5 0.3 - 1.2 mg/dL   GFR calc non Af Amer 32 (L) >60 mL/min   GFR calc Af Amer 37 (L) >60 mL/min   Anion gap 10 5 - 15    Comment: Performed at [facility] [address] [place], [place] [address]  CBC     Status: Abnormal   Collection Time: 03/06/19  6:00 PM  Result Value Ref Range   WBC 12.3 (H) 4.0 - 10.5 K/uL   RBC 4.16 3.87 - 5.11 MIL/uL   Hemoglobin 12.4 12.0 - 15.0 g/dL   HCT 38.4 36.0 - 46.0 %   MCV 92.3 80.0 - 100.0 fL   MCH 29.8 26.0 - 34.0 pg   MCHC 32.3 30.0 - 36.0 g/dL   RDW 12.7 11.5 - 15.5 %   Platelets 316 150 - 400 K/uL   nRBC 0.0 0.0 - 0.2 %    Comment: Performed at [facility], [place] [address] [place], [place] [address]  Ethanol     Status: None   Collection Time: 03/06/19  6:00 PM  Result Value Ref Range   Alcohol, Ethyl (B) <10 <10 mg/dL     Comment: (NOTE) Lowest detectable limit for serum alcohol is 10 mg/dL. For medical purposes only. Performed at [facility], [place] [address], [place], [place] [address]   Lactic acid, plasma     Status: None   Collection Time: 03/06/19  6:00 PM  Result Value Ref Range   Lactic Acid, Venous 1.8 0.5 - 1.9 mmol/L    Comment: Performed at [facility] [address] [place], [place] [address]  Protime-INR     Status: None   Collection Time: 03/06/19  6:00 PM  Result Value Ref Range   Prothrombin Time 12.9 11.4 - 15.2 seconds   INR 1.0 0.8 - 1.2    Comment: (NOTE) INR goal varies based on device and disease states. Performed at [facility], [place] [address], [place], [place] [address]   Sample to Blood Bank     Status: None   Collection Time: 03/06/19  6:23 PM  Result Value Ref Range   Blood Bank Specimen SAMPLE AVAILABLE FOR TESTING    Sample Expiration      03/07/2019,2359 Performed at [facility], [place] [address], [place], [place] [address]   SARS Coronavirus 2 (CEPHEID - Performed in [facility] hospital lab), Hosp Order     Status: None   Collection Time: 03/06/19 11:46 PM   Specimen: Nasopharyngeal Swab  Result Value Ref Range   SARS Coronavirus 2 NEGATIVE NEGATIVE    Comment: (NOTE) If result is NEGATIVE SARS-CoV-2 target nucleic acids are NOT DETECTED. The SARS-CoV-2 RNA is generally detectable in upper and lower  respiratory specimens during the acute phase of infection. The lowest  concentration of SARS-CoV-2 viral copies this assay can detect is 250  copies / mL. A negative result does not preclude SARS-CoV-2 infection  and should not be used as the sole basis for treatment or other  patient management decisions.  A negative result may occur with  improper specimen collection / handling, submission of specimen other  than nasopharyngeal swab, presence of viral mutation(s) within the  areas targeted by this assay, and inadequate number of  viral copies  (<250 copies / mL). A negative result must be combined with clinical  observations, patient history, and epidemiological information. If result is POSITIVE SARS-CoV-2 target nucleic acids are DETECTED. The SARS-CoV-2 RNA is generally detectable in upper and lower  respiratory specimens dur ing the acute phase of infection.  Positive  results are indicative of active infection with SARS-CoV-2.  Clinical  correlation with patient history and other diagnostic information is  necessary to determine patient infection status.  Positive results do  not rule out bacterial infection or co-infection with other viruses. If result is PRESUMPTIVE POSTIVE SARS-CoV-2 nucleic acids MAY BE PRESENT.   A presumptive positive result was obtained on the submitted specimen  and confirmed on repeat testing.  While 2019 novel coronavirus  (SARS-CoV-2) nucleic acids may be present in the submitted sample  additional confirmatory testing may be necessary for epidemiological  and / or clinical management purposes  to differentiate between  SARS-CoV-2 and other Sarbecovirus currently known to infect humans.  If clinically indicated additional testing with an alternate test  methodology [phone]) is advised. The SARS-CoV-2 RNA is generally  detectable in upper and lower respiratory sp ecimens during the acute  phase of infection. The expected result is Negative. Fact Sheet for Patients:  StrictlyIdeas.no Fact Sheet for Healthcare Providers: BankingDealers.co.za This test is not yet approved or cleared by the [place] FDA and has been authorized for detection and/or diagnosis of SARS-CoV-2 by FDA under an Emergency Use Authorization (EUA).  This EUA will remain in effect (meaning this test can be used) for the duration of the COVID-19 declaration under Section 564(b)(1) of the Act, 21 U.S.C. section 360bbb-3(b)(1), unless the authorization is  terminated or revoked sooner. Performed at [facility], [place] [address], [place],  [address]   Urinalysis, Routine w reflex microscopic     Status: Abnormal   Collection Time: 03/07/19  3:49 AM  Result Value Ref Range   Color, Urine YELLOW YELLOW   APPearance CLEAR CLEAR   Specific Gravity, Urine >1.046 (H) 1.005 - 1.030   pH 5.0 5.0 - 8.0   Glucose, UA NEGATIVE NEGATIVE mg/dL   Hgb urine dipstick NEGATIVE NEGATIVE   Bilirubin Urine NEGATIVE NEGATIVE   Ketones, ur NEGATIVE NEGATIVE mg/dL   Protein, ur NEGATIVE NEGATIVE mg/dL   Nitrite NEGATIVE NEGATIVE  Leukocytes,Ua NEGATIVE NEGATIVE    Comment: Performed at [facility], [place] [address], [place], [place] [address]  Basic metabolic panel     Status: Abnormal   Collection Time: 03/07/19  7:06 AM  Result Value Ref Range   Sodium 138 135 - 145 mmol/L   Potassium 4.1 3.5 - 5.1 mmol/L   Chloride 107 98 - 111 mmol/L   CO2 23 22 - 32 mmol/L   Glucose, Bld 92 70 - 99 mg/dL   BUN 15 8 - 23 mg/dL   Creatinine, Ser 1.39 (H) 0.44 - 1.00 mg/dL   Calcium 8.6 (L) 8.9 - 10.3 mg/dL   GFR calc non Af Amer 38 (L) >60 mL/min   GFR calc Af Amer 44 (L) >60 mL/min   Anion gap 8 5 - 15    Comment: Performed at [facility], [place] [address] [place], [place] [address]  CBC     Status: Abnormal   Collection Time: 03/07/19  7:06 AM  Result Value Ref Range   WBC 5.8 4.0 - 10.5 K/uL   RBC 3.52 (L) 3.87 - 5.11 MIL/uL   Hemoglobin 10.4 (L) 12.0 - 15.0 g/dL   HCT 32.4 (L) 36.0 - 46.0 %   MCV 92.0 80.0 - 100.0 fL   MCH 29.5 26.0 - 34.0 pg   MCHC 32.1 30.0 - 36.0 g/dL   RDW 13.0 11.5 - 15.5 %   Platelets 200 150 - 400 K/uL   nRBC 0.0 0.0 - 0.2 %    Comment: Performed at [facility], [place] [address] [place], [place] [address]  CBC     Status: Abnormal   Collection Time: 03/08/19  3:29 AM  Result Value Ref Range   WBC 5.7 4.0 - 10.5 K/uL   RBC 3.36 (L) 3.87 - 5.11 MIL/uL   Hemoglobin 9.8 (L) 12.0 - 15.0 g/dL   HCT  30.8 (L) 36.0 - 46.0 %   MCV 91.7 80.0 - 100.0 fL   MCH 29.2 26.0 - 34.0 pg   MCHC 31.8 30.0 - 36.0 g/dL   RDW 13.0 11.5 - 15.5 %   Platelets 192 150 - 400 K/uL   nRBC 0.0 0.0 - 0.2 %    Comment: Performed at [facility], [place] [address], [place], [place] [address]  Basic metabolic panel     Status: Abnormal   Collection Time: 03/08/19  3:29 AM  Result Value Ref Range   Sodium 141 135 - 145 mmol/L   Potassium 4.0 3.5 - 5.1 mmol/L   Chloride 107 98 - 111 mmol/L   CO2 26 22 - 32 mmol/L   Glucose, Bld 96 70 - 99 mg/dL   BUN 9 8 - 23 mg/dL   Creatinine, Ser 1.43 (H) 0.44 - 1.00 mg/dL   Calcium 8.8 (L) 8.9 - 10.3 mg/dL   GFR calc non Af Amer 37 (L) >60 mL/min   GFR calc Af Amer 43 (L) >60 mL/min   Anion gap 8 5 - 15    Comment: Performed at [facility] [address], [place], [place] [address]  Basic metabolic panel     Status: Abnormal   Collection Time: 03/09/19  3:37 AM  Result Value Ref Range   Sodium 139 135 - 145 mmol/L   Potassium 5.0 3.5 - 5.1 mmol/L    Comment: HEMOLYSIS AT THIS LEVEL MAY AFFECT RESULT   Chloride 109 98 - 111 mmol/L   CO2 21 (L)  22 - 32 mmol/L   Glucose, Bld 99 70 - 99 mg/dL   BUN 12 8 - 23 mg/dL   Creatinine, Ser 1.26 (H) 0.44 - 1.00 mg/dL   Calcium 8.7 (L) 8.9 - 10.3 mg/dL   GFR calc non Af Amer 43 (L) >60 mL/min   GFR calc Af Amer 50 (L) >60 mL/min   Anion gap 9 5 - 15    Comment: Performed at [facility] [address], [place], [place] [address]  Basic metabolic panel     Status: Abnormal   Collection Time: 03/10/19  2:29 AM  Result Value Ref Range   Sodium 138 135 - 145 mmol/L   Potassium 3.7 3.5 - 5.1 mmol/L   Chloride 104 98 - 111 mmol/L   CO2 26 22 - 32 mmol/L   Glucose, Bld 109 (H) 70 - 99 mg/dL   BUN 11 8 - 23 mg/dL   Creatinine, Ser 1.39 (H) 0.44 - 1.00 mg/dL   Calcium 8.9 8.9 - 10.3 mg/dL   GFR calc non Af Amer 38 (L) >60 mL/min   GFR calc Af Amer 44 (L) >60 mL/min   Anion gap 8 5 - 15    Comment: Performed  at [facility], [place] [address], [place], [place] [address]  Basic metabolic panel     Status: Abnormal   Collection Time: 03/12/19 10:42 PM  Result Value Ref Range   Sodium 135 135 - 145 mmol/L   Potassium 4.9 3.5 - 5.1 mmol/L   Chloride 104 98 - 111 mmol/L   CO2 23 22 - 32 mmol/L   Glucose, Bld 217 (H) 70 - 99 mg/dL   BUN 23 8 - 23 mg/dL   Creatinine, Ser 1.40 (H) 0.44 - 1.00 mg/dL   Calcium 9.3 8.9 - 10.3 mg/dL   GFR calc non Af Amer 38 (L) >60 mL/min   GFR calc Af Amer 44 (L) >60 mL/min   Anion gap 8 5 - 15    Comment: Performed at [facility] [address] [place], [place] [address]  CBC with Differential     Status: Abnormal   Collection Time: 03/12/19 10:42 PM  Result Value Ref Range   WBC 13.2 (H) 4.0 - 10.5 K/uL   RBC 3.81 (L) 3.87 - 5.11 MIL/uL   Hemoglobin 11.4 (L) 12.0 - 15.0 g/dL   HCT 36.9 36.0 - 46.0 %   MCV 96.9 80.0 - 100.0 fL   MCH 29.9 26.0 - 34.0 pg   MCHC 30.9 30.0 - 36.0 g/dL   RDW 13.2 11.5 - 15.5 %   Platelets 468 (H) 150 - 400 K/uL   nRBC 0.0 0.0 - 0.2 %   Neutrophils Relative % 78 %   Neutro Abs 10.4 (H) 1.7 - 7.7 K/uL   Lymphocytes Relative 13 %   Lymphs Abs 1.7 0.7 - 4.0 K/uL   Monocytes Relative 5 %   Monocytes Absolute 0.7 0.1 - 1.0 K/uL   Eosinophils Relative 2 %   Eosinophils Absolute 0.2 0.0 - 0.5 K/uL   Basophils Relative 1 %   Basophils Absolute 0.1 0.0 - 0.1 K/uL   Immature Granulocytes 1 %   Abs Immature Granulocytes 0.11 (H) 0.00 - 0.07 K/uL    Comment: Performed at [facility], [place] [address], [place], [place] [address]  Troponin I - Once     Status: None   Collection Time: 03/12/19 10:42 PM  Result Value Ref Range  Troponin I <0.03 <0.03 ng/mL    Comment: Performed at [facility], [place] [address], [place], [place] [address]  SARS Coronavirus 2 (CEPHEID - Performed in [place] hospital lab), Hosp Order     Status: None   Collection Time: 03/12/19 10:42 PM    Specimen: Nasopharyngeal Swab  Result Value Ref Range   SARS Coronavirus 2 NEGATIVE NEGATIVE    Comment: (NOTE) If result is NEGATIVE SARS-CoV-2 target nucleic acids are NOT DETECTED. The SARS-CoV-2 RNA is generally detectable in upper and lower  respiratory specimens during the acute phase of infection. The lowest  concentration of SARS-CoV-2 viral copies this assay can detect is 250  copies / mL. A negative result does not preclude SARS-CoV-2 infection  and should not be used as the sole basis for treatment or other  patient management decisions.  A negative result may occur with  improper specimen collection / handling, submission of specimen other  than nasopharyngeal swab, presence of viral mutation(s) within the  areas targeted by this assay, and inadequate number of viral copies  (<250 copies / mL). A negative result must be combined with clinical  observations, patient history, and epidemiological information. If result is POSITIVE SARS-CoV-2 target nucleic acids are DETECTED. The SARS-CoV-2 RNA is generally detectable in upper and lower  respiratory specimens dur ing the acute phase of infection.  Positive  results are indicative of active infection with SARS-CoV-2.  Clinical  correlation with patient history and other diagnostic information is  necessary to determine patient infection status.  Positive results do  not rule out bacterial infection or co-infection with other viruses. If result is PRESUMPTIVE POSTIVE SARS-CoV-2 nucleic acids MAY BE PRESENT.   A presumptive positive result was obtained on the submitted specimen  and confirmed on repeat testing.  While 2019 novel coronavirus  (SARS-CoV-2) nucleic acids may be present in the submitted sample  additional confirmatory testing may be necessary for epidemiological  and / or clinical management purposes  to differentiate between  SARS-CoV-2 and other Sarbecovirus currently known to infect humans.  If clinically  indicated additional testing with an alternate test  methodology [phone]) is advised. The SARS-CoV-2 RNA is generally  detectable in upper and lower respiratory sp ecimens during the acute  phase of infection. The expected result is Negative. Fact Sheet for Patients:  StrictlyIdeas.no Fact Sheet for Healthcare Providers: BankingDealers.co.za This test is not yet approved or cleared by the [place] FDA and has been authorized for detection and/or diagnosis of SARS-CoV-2 by FDA under an Emergency Use Authorization (EUA).  This EUA will remain in effect (meaning this test can be used) for the duration of the COVID-19 declaration under Section 564(b)(1) of the Act, 21 U.S.C. section 360bbb-3(b)(1), unless the authorization is terminated or revoked sooner. Performed at [facility], [place] [address], [place], [place] [address]   Brain natriuretic peptide     Status: Abnormal   Collection Time: 03/12/19 10:42 PM  Result Value Ref Range   B Natriuretic Peptide 817.2 (H) 0.0 - 100.0 pg/mL    Comment: Performed at [facility], [place] [address] [place], [place] [address]  Blood gas, venous     Status: Abnormal   Collection Time: 03/12/19 10:57 PM  Result Value Ref Range   FIO2 15.00    Delivery systems NON-REBREATHER OXYGEN MASK    pH, Ven 7.173 (LL) 7.250 - 7.430    Comment: CRITICAL RESULT CALLED TO, READ BACK BY AND VERIFIED WITH:  ELLIOT WENTZ MD AT 2304 BY AMY RAY RRT RCP ON 03/12/2019    pCO2, Ven 71.1 (HH) 44.0 - 60.0 mmHg    Comment: CRITICAL RESULT CALLED TO, READ BACK BY AND VERIFIED WITH: Christ Kick MD AT 2304 BY AMY RAY RRT,RCP ON 03/12/2019    pO2, Ven 63.4 (H) 32.0 - 45.0 mmHg   Bicarbonate 25.1 20.0 - 28.0 mmol/L   Acid-base deficit 4.7 (H) 0.0 - 2.0 mmol/L   O2 Saturation 83.5 %   Patient temperature 98.6    Collection site VEIN    Drawn by COLLECTED BY NURSE    Sample type VENOUS      Comment: Performed at [facility], [place] [address], [place], [place] [address]  Blood culture (routine x 2)     Status: None   Collection Time: 03/13/19 12:54 AM   Specimen: BLOOD  Result Value Ref Range   Specimen Description      BLOOD RIGHT ANTECUBITAL Performed at [facility] [address] [place], [place] [address]    Special Requests      BOTTLES DRAWN AEROBIC AND ANAEROBIC Blood Culture adequate volume Performed at [facility] [address], [place], [place] [address]    Culture      NO GROWTH 5 DAYS Performed at [facility], [place] [address], [place], [place] [address]    Report Status 03/18/2019 FINAL   Blood gas, arterial     Status: Abnormal   Collection Time: 03/13/19  2:43 AM  Result Value Ref Range   FIO2 35.00    Delivery systems BILEVEL POSITIVE AIRWAY PRESSURE    Inspiratory PAP 14.0    Expiratory PAP 5.0    pH, Arterial 7.329 (L) 7.350 - 7.450   pCO2 arterial 41.4 32.0 - 48.0 mmHg   pO2, Arterial 99.0 83.0 - 108.0 mmHg   Bicarbonate 21.4 20.0 - 28.0 mmol/L   Acid-base deficit 3.9 (H) 0.0 - 2.0 mmol/L   O2 Saturation 97.2 %   Patient temperature 97.3    Collection site RIGHT RADIAL    Drawn by [id number]    Sample type ARTERIAL DRAW    Allens test (pass/fail) PASS PASS    Comment: Performed at [facility] [address], [place], [place] [address]  Culture, sputum-assessment     Status: None   Collection Time: 03/13/19  3:57 AM   Specimen: Sputum  Result Value Ref Range   Specimen Description SPUTUM    Special Requests NONE    Sputum evaluation      THIS SPECIMEN IS ACCEPTABLE FOR SPUTUM CULTURE Performed at [facility], [place] [address], [place], [place] [address]    Report Status 03/16/2019 FINAL   Culture, respiratory     Status: None   Collection Time: 03/13/19  3:57 AM   Specimen: SPU  Result Value Ref Range   Specimen Description       SPUTUM Performed at [facility] [address] [place], [place] [address]    Special Requests      NONE Reflexed from [id number] Performed at [facility], [place] [address] [place], [place] [address]    Gram Stain      NO WBC SEEN RARE GRAM POSITIVE COCCI IN PAIRS IN CHAINS RARE GRAM POSITIVE RODS Performed at [facility], [place] [address], [place],  [address]    Culture ABUNDANT STREPTOCOCCUS MITIS/ORALIS    Report Status 03/18/2019 FINAL  Organism ID, Bacteria STREPTOCOCCUS MITIS/ORALIS       Susceptibility   Streptococcus mitis/oralis - MIC*    TETRACYCLINE >=16 RESISTANT Resistant     VANCOMYCIN 0.5 SENSITIVE Sensitive     CLINDAMYCIN <=0.25 SENSITIVE Sensitive     * ABUNDANT STREPTOCOCCUS MITIS/ORALIS  Troponin I - Now Then Q6H     Status: None   Collection Time: 03/13/19  6:54 AM  Result Value Ref Range   Troponin I <0.03 <0.03 ng/mL    Comment: Performed at [facility], [place] [address], [place], [place] [address]  Magnesium     Status: Abnormal   Collection Time: 03/13/19  6:54 AM  Result Value Ref Range   Magnesium 2.5 (H) 1.7 - 2.4 mg/dL    Comment: Performed at [facility], [place] [address] [place], [place] [address]  HIV antibody     Status: None   Collection Time: 03/13/19  6:54 AM  Result Value Ref Range   HIV Screen 4th Generation wRfx Non Reactive Non Reactive    Comment: (NOTE) Performed At: [facility] [place], [place] [id number] Rush Farmer MD [id number]   Culture, blood (routine x 2) Call MD if unable to obtain prior to antibiotics being given     Status: None   Collection Time: 03/13/19  6:54 AM   Specimen: Left Antecubital; Blood  Result Value Ref Range   Specimen Description      LEFT ANTECUBITAL Performed at [place] [address], [place], [place] [address]    Special Requests      BOTTLES DRAWN AEROBIC  ONLY Blood Culture adequate volume Performed at [place] [address] [place], [place] [address]    Culture      NO GROWTH 5 DAYS Performed at [facility], [place] [address], [place], [place] [address]    Report Status 03/18/2019 FINAL   Comprehensive metabolic panel     Status: Abnormal   Collection Time: 03/13/19  6:54 AM  Result Value Ref Range   Sodium 136 135 - 145 mmol/L   Potassium 4.7 3.5 - 5.1 mmol/L   Chloride 109 98 - 111 mmol/L   CO2 19 (L) 22 - 32 mmol/L   Glucose, Bld 129 (H) 70 - 99 mg/dL   BUN 21 8 - 23 mg/dL   Creatinine, Ser 1.19 (H) 0.44 - 1.00 mg/dL   Calcium 8.5 (L) 8.9 - 10.3 mg/dL   Total Protein 6.4 (L) 6.5 - 8.1 g/dL   Albumin 2.9 (L) 3.5 - 5.0 g/dL   AST 35 15 - 41 U/L   ALT 30 0 - 44 U/L   Alkaline Phosphatase 75 38 - 126 U/L   Total Bilirubin 0.8 0.3 - 1.2 mg/dL   GFR calc non Af Amer 46 (L) >60 mL/min   GFR calc Af Amer 53 (L) >60 mL/min   Anion gap 8 5 - 15    Comment: Performed at [facility], [place] [address], [place], [place] [address]  Hemoglobin A1c     Status: None   Collection Time: 03/13/19  6:54 AM  Result Value Ref Range   Hgb A1c MFr Bld 5.4 4.8 - 5.6 %    Comment: (NOTE)         Prediabetes: 5.7 - 6.4         Diabetes: >6.4         Glycemic control for adults with diabetes: <7.0  Mean Plasma Glucose 108 mg/dL    Comment: (NOTE) Performed At: [facility] [place], [place] [id number] Rush Farmer MD FB:[id number]   Phosphorus     Status: None   Collection Time: 03/13/19  6:54 AM  Result Value Ref Range   Phosphorus 4.1 2.5 - 4.6 mg/dL    Comment: Performed at [facility], [place] [address] [place], [place] [address]  TSH     Status: None   Collection Time: 03/13/19  6:54 AM  Result Value Ref Range   TSH 0.749 0.350 - 4.500 uIU/mL    Comment: Performed by a 3rd Generation assay with a functional sensitivity of <=0.01 uIU/mL.  Performed at [facility], [place] [address], [place], [place] [address]   CBC     Status: Abnormal   Collection Time: 03/13/19  6:54 AM  Result Value Ref Range   WBC 5.1 4.0 - 10.5 K/uL   RBC 3.13 (L) 3.87 - 5.11 MIL/uL   Hemoglobin 9.2 (L) 12.0 - 15.0 g/dL   HCT 30.8 (L) 36.0 - 46.0 %   MCV 98.4 80.0 - 100.0 fL   MCH 29.4 26.0 - 34.0 pg   MCHC 29.9 (L) 30.0 - 36.0 g/dL   RDW 13.1 11.5 - 15.5 %   Platelets 277 150 - 400 K/uL   nRBC 0.0 0.0 - 0.2 %    Comment: Performed at [facility], [place] [address] [place], [place] [address]  Glucose, capillary     Status: Abnormal   Collection Time: 03/13/19  8:02 AM  Result Value Ref Range   Glucose-Capillary 121 (H) 70 - 99 mg/dL   Comment 1 Notify RN    Comment 2 Document in Chart   ECHOCARDIOGRAM COMPLETE     Status: None   Collection Time: 03/13/19  9:12 AM  Result Value Ref Range   Weight 2,419.77 oz   Height 61 in   BP 106/74 mmHg  Glucose, capillary     Status: Abnormal   Collection Time: 03/13/19 12:28 PM  Result Value Ref Range   Glucose-Capillary 154 (H) 70 - 99 mg/dL   Comment 1 Notify RN    Comment 2 Document in Chart   MRSA PCR Screening     Status: None   Collection Time: 03/13/19 12:48 PM   Specimen: Nasopharyngeal  Result Value Ref Range   MRSA by PCR NEGATIVE NEGATIVE    Comment:        The GeneXpert MRSA Assay (FDA approved for NASAL specimens only), is one component of a comprehensive MRSA colonization surveillance program. It is not intended to diagnose MRSA infection nor to guide or monitor treatment for MRSA infections. Performed at [facility], [place] [address], [place], [place] [address]   Glucose, capillary     Status: Abnormal   Collection Time: 03/13/19  4:18 PM  Result Value Ref Range   Glucose-Capillary 146 (H) 70 - 99 mg/dL  Urinalysis, Routine w reflex microscopic     Status: None   Collection Time: 03/13/19  5:11 PM  Result Value Ref  Range   Color, Urine YELLOW YELLOW   APPearance CLEAR CLEAR   Specific Gravity, Urine 1.009 1.005 - 1.030   pH 5.0 5.0 - 8.0   Glucose, UA NEGATIVE NEGATIVE mg/dL   Hgb urine dipstick NEGATIVE NEGATIVE   Bilirubin Urine NEGATIVE NEGATIVE   Ketones, ur NEGATIVE NEGATIVE mg/dL   Protein, ur NEGATIVE NEGATIVE mg/dL   Nitrite NEGATIVE  NEGATIVE   Leukocytes,Ua NEGATIVE NEGATIVE    Comment: Performed at [facility], [place] [address], [place], [place] [address]  Strep pneumoniae urinary antigen     Status: None   Collection Time: 03/13/19  5:11 PM  Result Value Ref Range   Strep Pneumo Urinary Antigen NEGATIVE NEGATIVE    Comment: PERFORMED AT [facility]        Infection due to S. pneumoniae cannot be absolutely ruled out since the antigen present may be below the detection limit of the test. Performed at [facility], [address], [place], [place] [address]   Glucose, capillary     Status: Abnormal   Collection Time: 03/13/19  7:47 PM  Result Value Ref Range   Glucose-Capillary 136 (H) 70 - 99 mg/dL  Glucose, capillary     Status: Abnormal   Collection Time: 03/13/19 11:33 PM  Result Value Ref Range   Glucose-Capillary 148 (H) 70 - 99 mg/dL   Comment 1 Notify RN    Comment 2 Document in Chart   Glucose, capillary     Status: Abnormal   Collection Time: 03/14/19  3:15 AM  Result Value Ref Range   Glucose-Capillary 123 (H) 70 - 99 mg/dL  CBC     Status: Abnormal   Collection Time: 03/14/19  3:31 AM  Result Value Ref Range   WBC 9.2 4.0 - 10.5 K/uL   RBC 3.06 (L) 3.87 - 5.11 MIL/uL   Hemoglobin 9.3 (L) 12.0 - 15.0 g/dL   HCT 31.4 (L) 36.0 - 46.0 %   MCV 102.6 (H) 80.0 - 100.0 fL   MCH 30.4 26.0 - 34.0 pg   MCHC 29.6 (L) 30.0 - 36.0 g/dL   RDW 13.2 11.5 - 15.5 %   Platelets 309 150 - 400 K/uL   nRBC 0.0 0.0 - 0.2 %    Comment: Performed at [facility], [place] [address], [place], [place] [address]  Basic metabolic panel      Status: Abnormal   Collection Time: 03/14/19  3:31 AM  Result Value Ref Range   Sodium 141 135 - 145 mmol/L   Potassium 4.6 3.5 - 5.1 mmol/L   Chloride 110 98 - 111 mmol/L   CO2 22 22 - 32 mmol/L   Glucose, Bld 133 (H) 70 - 99 mg/dL   BUN 27 (H) 8 - 23 mg/dL   Creatinine, Ser 1.31 (H) 0.44 - 1.00 mg/dL   Calcium 9.4 8.9 - 10.3 mg/dL   GFR calc non Af Amer 41 (L) >60 mL/min   GFR calc Af Amer 47 (L) >60 mL/min   Anion gap 9 5 - 15    Comment: Performed at [place] [address], [place], [place] [address]  Glucose, capillary     Status: Abnormal   Collection Time: 03/14/19  7:41 AM  Result Value Ref Range   Glucose-Capillary 116 (H) 70 - 99 mg/dL  Glucose, capillary     Status: Abnormal   Collection Time: 03/14/19 11:17 AM  Result Value Ref Range   Glucose-Capillary 146 (H) 70 - 99 mg/dL  Glucose, capillary     Status: Abnormal   Collection Time: 03/14/19  3:41 PM  Result Value Ref Range   Glucose-Capillary 137 (H) 70 - 99 mg/dL  Glucose, capillary     Status: Abnormal   Collection Time: 03/14/19  7:59 PM  Result Value Ref Range   Glucose-Capillary 112 (H) 70 - 99 mg/dL  Glucose, capillary     Status: Abnormal   Collection Time: 03/15/19  1:10 AM  Result Value Ref Range   Glucose-Capillary 121 (H) 70 - 99 mg/dL  Glucose, capillary     Status: Abnormal   Collection Time: 03/15/19  3:43 AM  Result Value Ref Range   Glucose-Capillary 112 (H) 70 - 99 mg/dL  CBC     Status: Abnormal   Collection Time: 03/15/19  6:02 AM  Result Value Ref Range   WBC 9.5 4.0 - 10.5 K/uL   RBC 3.07 (L) 3.87 - 5.11 MIL/uL   Hemoglobin 9.0 (L) 12.0 - 15.0 g/dL   HCT 29.4 (L) 36.0 - 46.0 %   MCV 95.8 80.0 - 100.0 fL   MCH 29.3 26.0 - 34.0 pg   MCHC 30.6 30.0 - 36.0 g/dL   RDW 13.3 11.5 - 15.5 %   Platelets 376 150 - 400 K/uL   nRBC 0.0 0.0 - 0.2 %    Comment: Performed at [facility], [place] [address], [place], [place] [address]  Basic metabolic  panel     Status: Abnormal   Collection Time: 03/15/19  6:02 AM  Result Value Ref Range   Sodium 138 135 - 145 mmol/L   Potassium 3.6 3.5 - 5.1 mmol/L    Comment: DELTA CHECK NOTED REPEATED TO VERIFY    Chloride 104 98 - 111 mmol/L   CO2 22 22 - 32 mmol/L   Glucose, Bld 131 (H) 70 - 99 mg/dL   BUN 39 (H) 8 - 23 mg/dL   Creatinine, Ser 1.56 (H) 0.44 - 1.00 mg/dL   Calcium 9.5 8.9 - 10.3 mg/dL   GFR calc non Af Amer 33 (L) >60 mL/min   GFR calc Af Amer 38 (L) >60 mL/min   Anion gap 12 5 - 15    Comment: Performed at [facility], [place] [address], [place], [place] [address]  Glucose, capillary     Status: Abnormal   Collection Time: 03/15/19  8:24 AM  Result Value Ref Range   Glucose-Capillary 121 (H) 70 - 99 mg/dL  Glucose, capillary     Status: Abnormal   Collection Time: 03/15/19 11:32 AM  Result Value Ref Range   Glucose-Capillary 126 (H) 70 - 99 mg/dL  Glucose, capillary     Status: Abnormal   Collection Time: 03/15/19  4:01 PM  Result Value Ref Range   Glucose-Capillary 119 (H) 70 - 99 mg/dL  Glucose, capillary     Status: Abnormal   Collection Time: 03/15/19  8:27 PM  Result Value Ref Range   Glucose-Capillary 141 (H) 70 - 99 mg/dL  Glucose, capillary     Status: Abnormal   Collection Time: 03/16/19 12:03 AM  Result Value Ref Range   Glucose-Capillary 128 (H) 70 - 99 mg/dL  Glucose, capillary     Status: Abnormal   Collection Time: 03/16/19  4:12 AM  Result Value Ref Range   Glucose-Capillary 117 (H) 70 - 99 mg/dL  Basic metabolic panel     Status: Abnormal   Collection Time: 03/16/19  5:28 AM  Result Value Ref Range   Sodium 138 135 - 145 mmol/L   Potassium 3.5 3.5 - 5.1 mmol/L   Chloride 102 98 - 111 mmol/L   CO2 26 22 - 32 mmol/L   Glucose, Bld 121 (H) 70 - 99 mg/dL   BUN 51 (H) 8 - 23 mg/dL   Creatinine, Ser 1.65 (H) 0.44 - 1.00 mg/dL  Calcium 9.1 8.9 - 10.3 mg/dL   GFR calc non Af Amer 31 (L) >60 mL/min   GFR calc Af Amer 36 (L) >60  mL/min   Anion gap 10 5 - 15    Comment: Performed at [facility], [place] [address], [place], [place] [address]  CBC     Status: Abnormal   Collection Time: 03/16/19  5:28 AM  Result Value Ref Range   WBC 8.6 4.0 - 10.5 K/uL   RBC 3.01 (L) 3.87 - 5.11 MIL/uL   Hemoglobin 8.9 (L) 12.0 - 15.0 g/dL   HCT 28.5 (L) 36.0 - 46.0 %   MCV 94.7 80.0 - 100.0 fL   MCH 29.6 26.0 - 34.0 pg   MCHC 31.2 30.0 - 36.0 g/dL   RDW 13.2 11.5 - 15.5 %   Platelets 359 150 - 400 K/uL   nRBC 0.0 0.0 - 0.2 %    Comment: Performed at [facility], [place] [address] [place],  [address]  Glucose, capillary     Status: Abnormal   Collection Time: 03/16/19  7:24 AM  Result Value Ref Range   Glucose-Capillary 116 (H) 70 - 99 mg/dL  Glucose, capillary     Status: Abnormal   Collection Time: 03/16/19 11:36 AM  Result Value Ref Range   Glucose-Capillary 119 (H) 70 - 99 mg/dL  Glucose, capillary     Status: Abnormal   Collection Time: 03/16/19  4:54 PM  Result Value Ref Range   Glucose-Capillary 129 (H) 70 - 99 mg/dL   Comment 1 Notify RN    Comment 2 Document in Chart   Glucose, capillary     Status: Abnormal   Collection Time: 03/16/19  8:09 PM  Result Value Ref Range   Glucose-Capillary 140 (H) 70 - 99 mg/dL  Glucose, capillary     Status: Abnormal   Collection Time: 03/17/19  4:02 AM  Result Value Ref Range   Glucose-Capillary 108 (H) 70 - 99 mg/dL  Glucose, capillary     Status: Abnormal   Collection Time: 03/17/19  7:28 AM  Result Value Ref Range   Glucose-Capillary 113 (H) 70 - 99 mg/dL  Glucose, capillary     Status: None   Collection Time: 03/17/19 11:39 AM  Result Value Ref Range   Glucose-Capillary 97 70 - 99 mg/dL  Glucose, capillary     Status: Abnormal   Collection Time: 03/17/19  3:49 PM  Result Value Ref Range   Glucose-Capillary 108 (H) 70 - 99 mg/dL  Glucose, capillary     Status: Abnormal   Collection Time: 03/17/19  7:59 PM  Result Value  Ref Range   Glucose-Capillary 109 (H) 70 - 99 mg/dL   Comment 1 QC Due   Glucose, capillary     Status: Abnormal   Collection Time: 03/18/19 12:17 AM  Result Value Ref Range   Glucose-Capillary 128 (H) 70 - 99 mg/dL  Glucose, capillary     Status: Abnormal   Collection Time: 03/18/19  4:05 AM  Result Value Ref Range   Glucose-Capillary 115 (H) 70 - 99 mg/dL  Basic metabolic panel     Status: Abnormal   Collection Time: 03/18/19  8:11 AM  Result Value Ref Range   Sodium 140 135 - 145 mmol/L   Potassium 3.3 (L) 3.5 - 5.1 mmol/L   Chloride 107 98 - 111 mmol/L   CO2 23 22 - 32 mmol/L   Glucose, Bld 103 (H) 70 - 99 mg/dL  BUN 51 (H) 8 - 23 mg/dL   Creatinine, Ser 1.29 (H) 0.44 - 1.00 mg/dL   Calcium 8.9 8.9 - 10.3 mg/dL   GFR calc non Af Amer 42 (L) >60 mL/min   GFR calc Af Amer 48 (L) >60 mL/min   Anion gap 10 5 - 15    Comment: Performed at [facility], [place] [address], [place], [place] [address]  CBC     Status: Abnormal   Collection Time: 03/18/19  8:11 AM  Result Value Ref Range   WBC 10.7 (H) 4.0 - 10.5 K/uL   RBC 3.32 (L) 3.87 - 5.11 MIL/uL   Hemoglobin 10.1 (L) 12.0 - 15.0 g/dL   HCT 32.0 (L) 36.0 - 46.0 %   MCV 96.4 80.0 - 100.0 fL   MCH 30.4 26.0 - 34.0 pg   MCHC 31.6 30.0 - 36.0 g/dL   RDW 13.4 11.5 - 15.5 %   Platelets 439 (H) 150 - 400 K/uL   nRBC 0.0 0.0 - 0.2 %    Comment: Performed at [facility], [place] [address] [place], [place] [address]  Glucose, capillary     Status: Abnormal   Collection Time: 03/18/19 12:48 PM  Result Value Ref Range   Glucose-Capillary 109 (H) 70 - 99 mg/dL  SARS Coronavirus 2 (CEPHEID - Performed in [place] hospital lab), Hosp Order     Status: None   Collection Time: 03/21/19 11:47 AM   Specimen: Nasopharyngeal Swab  Result Value Ref Range   SARS Coronavirus 2 NEGATIVE NEGATIVE    Comment: (NOTE) If result is NEGATIVE SARS-CoV-2 target nucleic acids are NOT DETECTED. The SARS-CoV-2 RNA  is generally detectable in upper and lower  respiratory specimens during the acute phase of infection. The lowest  concentration of SARS-CoV-2 viral copies this assay can detect is 250  copies / mL. A negative result does not preclude SARS-CoV-2 infection  and should not be used as the sole basis for treatment or other  patient management decisions.  A negative result may occur with  improper specimen collection / handling, submission of specimen other  than nasopharyngeal swab, presence of viral mutation(s) within the  areas targeted by this assay, and inadequate number of viral copies  (<250 copies / mL). A negative result must be combined with clinical  observations, patient history, and epidemiological information. If result is POSITIVE SARS-CoV-2 target nucleic acids are DETECTED. The SARS-CoV-2 RNA is generally detectable in upper and lower  respiratory specimens dur ing the acute phase of infection.  Positive  results are indicative of active infection with SARS-CoV-2.  Clinical  correlation with patient history and other diagnostic information is  necessary to determine patient infection status.  Positive results do  not rule out bacterial infection or co-infection with other viruses. If result is PRESUMPTIVE POSTIVE SARS-CoV-2 nucleic acids MAY BE PRESENT.   A presumptive positive result was obtained on the submitted specimen  and confirmed on repeat testing.  While 2019 novel coronavirus  (SARS-CoV-2) nucleic acids may be present in the submitted sample  additional confirmatory testing may be necessary for epidemiological  and / or clinical management purposes  to differentiate between  SARS-CoV-2 and other Sarbecovirus currently known to infect humans.  If clinically indicated additional testing with an alternate test  methodology [id number] is advised. The SARS-CoV-2 RNA is generally  detectable in upper and lower respiratory sp ecimens during the acute  phase of infection.  The expected result is Negative. Fact  Sheet for Patients:  StrictlyIdeas.no Fact Sheet for Healthcare Providers: BankingDealers.co.za This test is not yet approved or cleared by the [place] FDA and has been authorized for detection and/or diagnosis of SARS-CoV-2 by FDA under an Emergency Use Authorization (EUA).  This EUA will remain in effect (meaning this test can be used) for the duration of the COVID-19 declaration under Section 564(b)(1) of the Act, 21 U.S.C. section 360bbb-3(b)(1), unless the authorization is terminated or revoked sooner. Performed at [facility], [place] [address] [place], [place] [address]   Basic metabolic panel     Status: Abnormal   Collection Time: 03/22/19  8:49 AM  Result Value Ref Range   Sodium 140 135 - 145 mmol/L   Potassium 3.8 3.5 - 5.1 mmol/L   Chloride 106 98 - 111 mmol/L   CO2 27 22 - 32 mmol/L   Glucose, Bld 99 70 - 99 mg/dL   BUN 33 (H) 8 - 23 mg/dL   Creatinine, Ser 1.28 (H) 0.44 - 1.00 mg/dL   Calcium 8.8 (L) 8.9 - 10.3 mg/dL   GFR calc non Af Amer 42 (L) >60 mL/min   GFR calc Af Amer 49 (L) >60 mL/min   Anion gap 7 5 - 15    Comment: Performed at [place] [address] [place], [place] [address]  CBC     Status: Abnormal   Collection Time: 03/22/19  8:49 AM  Result Value Ref Range   WBC 12.0 (H) 4.0 - 10.5 K/uL   RBC 3.25 (L) 3.87 - 5.11 MIL/uL   Hemoglobin 9.7 (L) 12.0 - 15.0 g/dL   HCT 31.1 (L) 36.0 - 46.0 %   MCV 95.7 80.0 - 100.0 fL   MCH 29.8 26.0 - 34.0 pg   MCHC 31.2 30.0 - 36.0 g/dL   RDW 13.6 11.5 - 15.5 %   Platelets 375 150 - 400 K/uL   nRBC 0.0 0.0 - 0.2 %    Comment: Performed at [facility], [place] [address], [place], [place] [address]  I-STAT 7, (LYTES, BLD GAS, ICA, H+H)     Status: Abnormal   Collection Time: 03/22/19 11:47 AM  Result Value Ref Range   pH, Arterial 7.396 7.350 - 7.450   pCO2 arterial 40.9 32.0 - 48.0 mmHg    pO2, Arterial 87.0 83.0 - 108.0 mmHg   Bicarbonate 25.1 20.0 - 28.0 mmol/L   TCO2 26 22 - 32 mmol/L   O2 Saturation 97.0 %   Sodium 140 135 - 145 mmol/L   Potassium 4.3 3.5 - 5.1 mmol/L   Calcium, Ion 1.26 1.15 - 1.40 mmol/L   HCT 26.0 (L) 36.0 - 46.0 %   Hemoglobin 8.8 (L) 12.0 - 15.0 g/dL   Patient temperature HIDE    Sample type ARTERIAL   POCT I-Stat EG7     Status: Abnormal   Collection Time: 03/22/19 11:52 AM  Result Value Ref Range   pH, Ven 7.372 7.250 - 7.430   pCO2, Ven 43.5 (L) 44.0 - 60.0 mmHg   pO2, Ven 36.0 32.0 - 45.0 mmHg   Bicarbonate 25.3 20.0 - 28.0 mmol/L   TCO2 27 22 - 32 mmol/L   O2 Saturation 68.0 %   Sodium 140 135 - 145 mmol/L   Potassium 4.3 3.5 - 5.1 mmol/L   Calcium, Ion 1.27 1.15 - 1.40 mmol/L   HCT 27.0 (L) 36.0 - 46.0 %   Hemoglobin 9.2 (L) 12.0 - 15.0 g/dL  Patient temperature HIDE    Sample type VENOUS    Comment NOTIFIED PHYSICIAN   POCT I-Stat EG7     Status: Abnormal   Collection Time: 03/22/19 11:52 AM  Result Value Ref Range   pH, Ven 7.375 7.250 - 7.430   pCO2, Ven 43.4 (L) 44.0 - 60.0 mmHg   pO2, Ven 36.0 32.0 - 45.0 mmHg   Bicarbonate 25.4 20.0 - 28.0 mmol/L   TCO2 27 22 - 32 mmol/L   O2 Saturation 68.0 %   Sodium 140 135 - 145 mmol/L   Potassium 4.3 3.5 - 5.1 mmol/L   Calcium, Ion 1.26 1.15 - 1.40 mmol/L   HCT 26.0 (L) 36.0 - 46.0 %   Hemoglobin 8.8 (L) 12.0 - 15.0 g/dL   Patient temperature HIDE    Sample type VENOUS    Comment NOTIFIED PHYSICIAN   Pulmonary function test     Status: None   Collection Time: 03/29/19  2:40 PM  Result Value Ref Range   FVC-Pre 1.24 L   FVC-%Pred-Pre 47 %   FVC-Post 1.51 L   FVC-%Pred-Post 57 %   FVC-%Change-Post 21 %   FEV1-Pre 0.65 L   FEV1-%Pred-Pre 33 %   FEV1-Post 0.84 L   FEV1-%Pred-Post 42 %   FEV1-%Change-Post 29 %   FEV6-Pre 1.21 L   FEV6-%Pred-Pre 49 %   FEV6-Post 1.45 L   FEV6-%Pred-Post 58 %   FEV6-%Change-Post 19 %   Pre FEV1/FVC ratio 52 %   FEV1FVC-%Pred-Pre 69  %   Post FEV1/FVC ratio 56 %   FEV1FVC-%Change-Post 6 %   Pre FEV6/FVC Ratio 99 %   FEV6FVC-%Pred-Pre 104 %   Post FEV6/FVC ratio 96 %   FEV6FVC-%Pred-Post 101 %   FEV6FVC-%Change-Post -2 %   FEF 25-75 Pre 0.28 L/sec   FEF2575-%Pred-Pre 16 %   FEF 25-75 Post 0.51 L/sec   FEF2575-%Pred-Post 30 %   FEF2575-%Change-Post 83 %   RV 4.77 L   RV % pred 231 %   TLC 6.26 L   TLC % pred 135 %   DLCO unc 0.03 ml/min/mmHg   DLCO unc % pred 0 %   DL/VA 0.05 ml/min/mmHg/L   DL/VA % pred 1 %  Hemoglobin A1c     Status: None   Collection Time: 04/04/19 10:52 AM  Result Value Ref Range   Hgb A1c MFr Bld 5.2 4.8 - 5.6 %    Comment: (NOTE) Pre diabetes:          5.7%-6.4% Diabetes:              >6.4% Glycemic control for   <7.0% adults with diabetes    Mean Plasma Glucose 102.54 mg/dL    Comment: Performed at [facility], [address] [place], [place] [address]  APTT     Status: None   Collection Time: 04/04/19 10:53 AM  Result Value Ref Range   aPTT 30 24 - 36 seconds    Comment: Performed at [place] [address], [place], [place] [address]  Blood gas, arterial on room air     Status: Abnormal   Collection Time: 04/04/19 10:53 AM  Result Value Ref Range   FIO2 21.00    pH, Arterial 7.459 (H) 7.350 - 7.450   pCO2 arterial 35.9 32.0 - 48.0 mmHg   pO2, Arterial 120 (H) 83.0 - 108.0 mmHg   Bicarbonate 25.1 20.0 - 28.0 mmol/L   Acid-Base Excess 1.6 0.0 - 2.0 mmol/L   O2  Saturation 98.9 %   Patient temperature 98.6    Collection site LEFT BRACHIAL    Drawn by [phone]    Sample type ARTERIAL DRAW    Allens test (pass/fail) PASS PASS  CBC     Status: Abnormal   Collection Time: 04/04/19 10:53 AM  Result Value Ref Range   WBC 13.3 (H) 4.0 - 10.5 K/uL   RBC 3.36 (L) 3.87 - 5.11 MIL/uL   Hemoglobin 10.1 (L) 12.0 - 15.0 g/dL   HCT 33.0 (L) 36.0 - 46.0 %   MCV 98.2 80.0 - 100.0 fL   MCH 30.1 26.0 - 34.0 pg   MCHC 30.6 30.0 - 36.0 g/dL   RDW 14.7 11.5 - 15.5 %    Platelets 195 150 - 400 K/uL   nRBC 0.0 0.0 - 0.2 %    Comment: Performed at [facility], [place]. [address] [place], [place] [address]  Comprehensive metabolic panel     Status: Abnormal   Collection Time: 04/04/19 10:53 AM  Result Value Ref Range   Sodium 139 135 - 145 mmol/L   Potassium 3.8 3.5 - 5.1 mmol/L   Chloride 105 98 - 111 mmol/L   CO2 21 (L) 22 - 32 mmol/L   Glucose, Bld 107 (H) 70 - 99 mg/dL   BUN 32 (H) 8 - 23 mg/dL   Creatinine, Ser 1.38 (H) 0.44 - 1.00 mg/dL   Calcium 9.0 8.9 - 10.3 mg/dL   Total Protein 6.3 (L) 6.5 - 8.1 g/dL   Albumin 3.3 (L) 3.5 - 5.0 g/dL   AST 19 15 - 41 U/L   ALT 24 0 - 44 U/L   Alkaline Phosphatase 68 38 - 126 U/L   Total Bilirubin 0.7 0.3 - 1.2 mg/dL   GFR calc non Af Amer 38 (L) >60 mL/min   GFR calc Af Amer 44 (L) >60 mL/min   Anion gap 13 5 - 15    Comment: Performed at [facility], [place] [address] [place], [place] [address]  Protime-INR     Status: None   Collection Time: 04/04/19 10:53 AM  Result Value Ref Range   Prothrombin Time 12.4 11.4 - 15.2 seconds   INR 0.9 0.8 - 1.2    Comment: (NOTE) INR goal varies based on device and disease states. Performed at [facility], [place] [address], [place], [place] [address]   Urinalysis, Routine w reflex microscopic     Status: Abnormal   Collection Time: 04/04/19 10:53 AM  Result Value Ref Range   Color, Urine STRAW (A) YELLOW   APPearance CLEAR CLEAR   Specific Gravity, Urine 1.005 1.005 - 1.030   pH 6.0 5.0 - 8.0   Glucose, UA NEGATIVE NEGATIVE mg/dL   Hgb urine dipstick NEGATIVE NEGATIVE   Bilirubin Urine NEGATIVE NEGATIVE   Ketones, ur NEGATIVE NEGATIVE mg/dL   Protein, ur NEGATIVE NEGATIVE mg/dL   Nitrite NEGATIVE NEGATIVE   Leukocytes,Ua NEGATIVE NEGATIVE    Comment: Performed at [place] [address], [place], [place] [address]  Type and screen     Status: None   Collection Time: 04/04/19 11:17 AM  Result Value Ref Range   ABO/RH(D) A POS     Antibody Screen NEG    Sample Expiration 04/18/2019,2359    Extend sample reason      NO TRANSFUSIONS OR PREGNANCY IN THE PAST 3 MONTHS Performed at [facility], [place] [address] [place],  [address]  ABO/Rh     Status: None   Collection Time: 04/04/19 11:17 AM  Result Value Ref Range   ABO/RH(D)      A POS Performed at [facility], [place] [address], [place], [place] [address]   Novel Coronavirus, NAA (hospital order; send-out to ref lab)     Status: None   Collection Time: 04/04/19 12:29 PM   Specimen: Nasopharyngeal Swab; Respiratory  Result Value Ref Range   SARS-CoV-2, NAA NOT DETECTED NOT DETECTED    Comment: (NOTE) This test was developed and its performance characteristics determined by [employer]. This test has not been FDA cleared or approved. This test has been authorized by FDA under an Emergency Use Authorization (EUA). This test is only authorized for the duration of time the declaration that circumstances exist justifying the authorization of the emergency use of in vitro diagnostic tests for detection of SARS-CoV-2 virus and/or diagnosis of COVID-19 infection under section 564(b)(1) of the Act, 21 U.S.C. 505LZJ-6(B)(3), unless the authorization is terminated or revoked sooner. When diagnostic testing is negative, the possibility of a false negative result should be considered in the context of a patient's recent exposures and the presence of clinical signs and symptoms consistent with COVID-19. An individual without symptoms of COVID-19 and who is not shedding SARS-CoV-2 virus would expect to have a negative (not detected) result in this assay. Performed  At: [facility] [place], [place] [id number] Rush Farmer MD OX:[id number]    Coronavirus Source NASOPHARYNGEAL     Comment: Performed at [facility], [place] [address] [place], [place] [address]  Surgical pcr screen     Status: None   Collection Time:  04/04/19  4:48 PM  Result Value Ref Range   MRSA, PCR NEGATIVE NEGATIVE   Staphylococcus aureus NEGATIVE NEGATIVE    Comment: (NOTE) The Xpert SA Assay (FDA approved for NASAL specimens in patients 65 years of age and older), is one component of a comprehensive surveillance program. It is not intended to diagnose infection nor to guide or monitor treatment. Performed at [facility], [place] [address], [place], [place] [address]     Assessment/Plan: 1. Cramping of hands Will check electrolyte levels today. Suspect could be related to electrolyte imbalance from her Lasix in combination with stopping Losartan. Increased risk of hypokalemia.  - Basic metabolic panel   [name], PA-C

## 2019-04-10 DIAGNOSIS — S92309A Fracture of unspecified metatarsal bone(s), unspecified foot, initial encounter for closed fracture: Secondary | ICD-10-CM | POA: Diagnosis not present

## 2019-04-11 ENCOUNTER — Other Ambulatory Visit: Payer: Self-pay | Admitting: *Deleted

## 2019-04-11 MED ORDER — POTASSIUM CHLORIDE CRYS ER 20 MEQ PO TBCR: 20.0000 meq | EXTENDED_RELEASE_TABLET | Freq: Every day | ORAL | 3 refills | Status: DC

## 2019-04-11 NOTE — Patient Outreach (Signed)
[facility]) Care Management  04/11/2019  Jordayn Mink 25-Feb-1946 [id number]   Case closure   [facility] RN CM senton 6/4/20a successful outreach letter as discussed with [facility] brochure enclosed for reviewafter getting the attempt to speak with her briefly Messages have been left on 03/24/19, 03/25/19 and 03/29/19  Routed note to MD on 03/29/19  No responses   [facility] RN CM will close case after no response from patient within 10 business days. Unable to reach Case closure letters sent to patient and MD  Joelene Millin L. [name], RN, BSN, [place] Coordinator Office number [phone] Mobile number [phone]  Main [facility] number [phone] Fax number [phone]

## 2019-04-12 DIAGNOSIS — E785 Hyperlipidemia, unspecified: Secondary | ICD-10-CM | POA: Diagnosis not present

## 2019-04-12 DIAGNOSIS — I10 Essential (primary) hypertension: Secondary | ICD-10-CM | POA: Diagnosis not present

## 2019-04-12 DIAGNOSIS — M47812 Spondylosis without myelopathy or radiculopathy, cervical region: Secondary | ICD-10-CM | POA: Diagnosis not present

## 2019-04-12 DIAGNOSIS — S92354D Nondisplaced fracture of fifth metatarsal bone, right foot, subsequent encounter for fracture with routine healing: Secondary | ICD-10-CM | POA: Diagnosis not present

## 2019-04-12 DIAGNOSIS — M1611 Unilateral primary osteoarthritis, right hip: Secondary | ICD-10-CM | POA: Diagnosis not present

## 2019-04-12 DIAGNOSIS — S32038D Other fracture of third lumbar vertebra, subsequent encounter for fracture with routine healing: Secondary | ICD-10-CM | POA: Diagnosis not present

## 2019-04-12 DIAGNOSIS — J449 Chronic obstructive pulmonary disease, unspecified: Secondary | ICD-10-CM | POA: Diagnosis not present

## 2019-04-14 DIAGNOSIS — M47812 Spondylosis without myelopathy or radiculopathy, cervical region: Secondary | ICD-10-CM | POA: Diagnosis not present

## 2019-04-14 DIAGNOSIS — J449 Chronic obstructive pulmonary disease, unspecified: Secondary | ICD-10-CM | POA: Diagnosis not present

## 2019-04-14 DIAGNOSIS — I10 Essential (primary) hypertension: Secondary | ICD-10-CM | POA: Diagnosis not present

## 2019-04-14 DIAGNOSIS — E785 Hyperlipidemia, unspecified: Secondary | ICD-10-CM | POA: Diagnosis not present

## 2019-04-14 DIAGNOSIS — S32038D Other fracture of third lumbar vertebra, subsequent encounter for fracture with routine healing: Secondary | ICD-10-CM | POA: Diagnosis not present

## 2019-04-14 DIAGNOSIS — M1611 Unilateral primary osteoarthritis, right hip: Secondary | ICD-10-CM | POA: Diagnosis not present

## 2019-04-14 DIAGNOSIS — S92354D Nondisplaced fracture of fifth metatarsal bone, right foot, subsequent encounter for fracture with routine healing: Secondary | ICD-10-CM | POA: Diagnosis not present

## 2019-04-16 ENCOUNTER — Other Ambulatory Visit (HOSPITAL_COMMUNITY): Payer: Medicare Other

## 2019-04-18 ENCOUNTER — Encounter: Payer: Self-pay | Admitting: Thoracic Surgery (Cardiothoracic Vascular Surgery)

## 2019-04-18 ENCOUNTER — Encounter (HOSPITAL_COMMUNITY): Payer: Self-pay | Admitting: Thoracic Surgery (Cardiothoracic Vascular Surgery)

## 2019-04-18 ENCOUNTER — Other Ambulatory Visit (HOSPITAL_COMMUNITY)
Admission: RE | Admit: 2019-04-18 | Discharge: 2019-04-18 | Disposition: A | Discharge status: Home / Self Care | Payer: Medicare Other | Source: Ambulatory Visit | Attending: Thoracic Surgery (Cardiothoracic Vascular Surgery) | Admitting: Thoracic Surgery (Cardiothoracic Vascular Surgery)

## 2019-04-18 ENCOUNTER — Ambulatory Visit
Admission: RE | Admit: 2019-04-18 | Discharge: 2019-04-18 | Disposition: A | Discharge status: Home / Self Care | Payer: Medicare Other | Source: Ambulatory Visit | Attending: Thoracic Surgery (Cardiothoracic Vascular Surgery) | Admitting: Thoracic Surgery (Cardiothoracic Vascular Surgery)

## 2019-04-18 ENCOUNTER — Other Ambulatory Visit: Payer: Self-pay

## 2019-04-18 ENCOUNTER — Other Ambulatory Visit: Payer: Self-pay | Admitting: Thoracic Surgery (Cardiothoracic Vascular Surgery)

## 2019-04-18 ENCOUNTER — Encounter (HOSPITAL_COMMUNITY)
Admission: RE | Admit: 2019-04-18 | Discharge: 2019-04-18 | Disposition: A | Discharge status: Home / Self Care | Payer: Medicare Other | Source: Ambulatory Visit | Attending: Thoracic Surgery (Cardiothoracic Vascular Surgery) | Admitting: Thoracic Surgery (Cardiothoracic Vascular Surgery)

## 2019-04-18 ENCOUNTER — Ambulatory Visit (INDEPENDENT_AMBULATORY_CARE_PROVIDER_SITE_OTHER): Payer: Medicare Other | Admitting: Thoracic Surgery (Cardiothoracic Vascular Surgery)

## 2019-04-18 VITALS — BP 107/77 | HR 93 | Temp 97.5°F | Resp 16 | Ht 61.0 in | Wt 141.0 lb

## 2019-04-18 DIAGNOSIS — I2510 Atherosclerotic heart disease of native coronary artery without angina pectoris: Secondary | ICD-10-CM | POA: Diagnosis not present

## 2019-04-18 DIAGNOSIS — Z87891 Personal history of nicotine dependence: Secondary | ICD-10-CM | POA: Insufficient documentation

## 2019-04-18 DIAGNOSIS — J439 Emphysema, unspecified: Secondary | ICD-10-CM | POA: Insufficient documentation

## 2019-04-18 DIAGNOSIS — I341 Nonrheumatic mitral (valve) prolapse: Secondary | ICD-10-CM | POA: Diagnosis not present

## 2019-04-18 DIAGNOSIS — I272 Pulmonary hypertension, unspecified: Secondary | ICD-10-CM | POA: Diagnosis not present

## 2019-04-18 DIAGNOSIS — Z01812 Encounter for preprocedural laboratory examination: Secondary | ICD-10-CM | POA: Insufficient documentation

## 2019-04-18 DIAGNOSIS — Z1159 Encounter for screening for other viral diseases: Secondary | ICD-10-CM | POA: Diagnosis not present

## 2019-04-18 DIAGNOSIS — Z952 Presence of prosthetic heart valve: Secondary | ICD-10-CM

## 2019-04-18 DIAGNOSIS — I5032 Chronic diastolic (congestive) heart failure: Secondary | ICD-10-CM | POA: Diagnosis present

## 2019-04-18 DIAGNOSIS — R0602 Shortness of breath: Secondary | ICD-10-CM | POA: Diagnosis not present

## 2019-04-18 DIAGNOSIS — J449 Chronic obstructive pulmonary disease, unspecified: Secondary | ICD-10-CM | POA: Diagnosis not present

## 2019-04-18 DIAGNOSIS — D62 Acute posthemorrhagic anemia: Secondary | ICD-10-CM | POA: Diagnosis not present

## 2019-04-18 DIAGNOSIS — N183 Chronic kidney disease, stage 3 (moderate): Secondary | ICD-10-CM | POA: Diagnosis not present

## 2019-04-18 DIAGNOSIS — I340 Nonrheumatic mitral (valve) insufficiency: Secondary | ICD-10-CM

## 2019-04-18 DIAGNOSIS — Z7951 Long term (current) use of inhaled steroids: Secondary | ICD-10-CM | POA: Diagnosis not present

## 2019-04-18 DIAGNOSIS — Z905 Acquired absence of kidney: Secondary | ICD-10-CM | POA: Diagnosis not present

## 2019-04-18 DIAGNOSIS — E7800 Pure hypercholesterolemia, unspecified: Secondary | ICD-10-CM | POA: Diagnosis not present

## 2019-04-18 DIAGNOSIS — I130 Hypertensive heart and chronic kidney disease with heart failure and stage 1 through stage 4 chronic kidney disease, or unspecified chronic kidney disease: Secondary | ICD-10-CM | POA: Diagnosis not present

## 2019-04-18 DIAGNOSIS — Z85528 Personal history of other malignant neoplasm of kidney: Secondary | ICD-10-CM | POA: Insufficient documentation

## 2019-04-18 DIAGNOSIS — Z79899 Other long term (current) drug therapy: Secondary | ICD-10-CM | POA: Insufficient documentation

## 2019-04-18 DIAGNOSIS — R0902 Hypoxemia: Secondary | ICD-10-CM | POA: Diagnosis not present

## 2019-04-18 DIAGNOSIS — Z006 Encounter for examination for normal comparison and control in clinical research program: Secondary | ICD-10-CM | POA: Diagnosis not present

## 2019-04-18 DIAGNOSIS — E872 Acidosis: Secondary | ICD-10-CM | POA: Diagnosis not present

## 2019-04-18 DIAGNOSIS — I5033 Acute on chronic diastolic (congestive) heart failure: Secondary | ICD-10-CM | POA: Diagnosis present

## 2019-04-18 DIAGNOSIS — I714 Abdominal aortic aneurysm, without rupture: Secondary | ICD-10-CM | POA: Insufficient documentation

## 2019-04-18 DIAGNOSIS — I10 Essential (primary) hypertension: Secondary | ICD-10-CM | POA: Insufficient documentation

## 2019-04-18 DIAGNOSIS — Z7982 Long term (current) use of aspirin: Secondary | ICD-10-CM | POA: Diagnosis not present

## 2019-04-18 DIAGNOSIS — K219 Gastro-esophageal reflux disease without esophagitis: Secondary | ICD-10-CM | POA: Insufficient documentation

## 2019-04-18 DIAGNOSIS — I4891 Unspecified atrial fibrillation: Secondary | ICD-10-CM | POA: Diagnosis not present

## 2019-04-18 DIAGNOSIS — E785 Hyperlipidemia, unspecified: Secondary | ICD-10-CM | POA: Diagnosis not present

## 2019-04-18 LAB — SURGICAL PCR SCREEN
MRSA, PCR: NEGATIVE
Staphylococcus aureus: NEGATIVE

## 2019-04-18 LAB — BASIC METABOLIC PANEL
Anion gap: 9 (ref 5–15)
BUN: 8 mg/dL (ref 8–23)
CO2: 22 mmol/L (ref 22–32)
Calcium: 9.8 mg/dL (ref 8.9–10.3)
Chloride: 107 mmol/L (ref 98–111)
Creatinine, Ser: 1.35 mg/dL — ABNORMAL HIGH (ref 0.44–1.00)
GFR calc Af Amer: 46 mL/min — ABNORMAL LOW (ref >60)
GFR calc non Af Amer: 39 mL/min — ABNORMAL LOW (ref >60)
Glucose, Bld: 92 mg/dL (ref 70–99)
Potassium: 4.7 mmol/L (ref 3.5–5.1)
Sodium: 138 mmol/L (ref 135–145)

## 2019-04-18 LAB — CBC
HCT: 33.1 % — ABNORMAL LOW (ref 36.0–46.0)
Hemoglobin: 10.1 g/dL — ABNORMAL LOW (ref 12.0–15.0)
MCH: 29.2 pg (ref 26.0–34.0)
MCHC: 30.5 g/dL (ref 30.0–36.0)
MCV: 95.7 fL (ref 80.0–100.0)
Platelets: 357 10*3/uL (ref 150–400)
RBC: 3.46 MIL/uL — ABNORMAL LOW (ref 3.87–5.11)
RDW: 13.6 % (ref 11.5–15.5)
WBC: 7.2 10*3/uL (ref 4.0–10.5)
nRBC: 0 % (ref 0.0–0.2)

## 2019-04-18 LAB — SARS CORONAVIRUS 2 (TAT 6-24 HRS): SARS Coronavirus 2: NEGATIVE

## 2019-04-18 NOTE — Progress Notes (Signed)
Nurse that saw pt at  PAT on 04/04/2019 stated that a surgical PCR needed to be repeated due to mix up in the lab.

## 2019-04-18 NOTE — Progress Notes (Addendum)
[address]       [place], [address]             [phone]     CARDIOTHORACIC SURGERY OFFICE NOTE  Referring Provider is [name], MD Primary Cardiologist is Skeet Latch, MD PCP is Brunetta Jeans, PA-C   HPI:  Patient is a 73 year old female with history of mitral valve prolapse with mitral regurgitation, hypertension, abdominal aortic aneurysm, COPD, and longstanding tobacco abuse who returns to the office today with tentative plans to proceed with mitral valve repair later this week.  She was originally seen in consultation on March 25, 2019.  Her surgery was originally scheduled for 2 weeks ago, but when she returned for follow-up on April 04, 2019 she still had a productive cough with blood-tinged sputum and slightly elevated white blood count.  Chest x-ray revealed mild diffuse pulmonary opacities.  Surgery was postponed.  She returns her office today and reports that her cough has completely cleared up.  She still has an intermittent cough which is chronic but much improved.  She has no fevers or chills.  She coughs up a scant amount of clear sputum.  She completed her prednisone taper.  Her shortness of breath is unchanged from previously.  She otherwise feels well and is looking forward to proceeding with her surgery.  She has not been smoking any cigarettes.   Current Outpatient Medications  Medication Sig Dispense Refill   acetaminophen (TYLENOL) 325 MG tablet Take 2 tablets (650 mg total) by mouth every 6 (six) hours as needed. (Patient taking differently: Take 650 mg by mouth every 6 (six) hours as needed (pain.). )     Acetaminophen-Codeine (TYLENOL/CODEINE #3) 300-30 MG tablet Take 1 tablet by mouth every 4 (four) hours as needed for pain. (Patient taking differently: Take 1 tablet by mouth 2 (two) times daily as needed for pain. ) 30 tablet 0   albuterol (PROVENTIL) (2.5 MG/3ML) 0.083% nebulizer solution Take 3 mLs (2.5 mg total) by  nebulization every 6 (six) hours as needed for wheezing or shortness of breath. 150 mL 3   aspirin EC 81 MG tablet Take 1 tablet (81 mg total) by mouth daily. 30 tablet 0   atorvastatin (LIPITOR) 40 MG tablet Take 1 tablet (40 mg total) by mouth daily at 6 PM. 90 tablet 1   fenofibrate 160 MG tablet Take 1 tablet (160 mg total) by mouth daily. 90 tablet 1   metoprolol tartrate (LOPRESSOR) 25 MG tablet Take 1 tablet (25 mg total) by mouth 2 (two) times daily. 60 tablet 0   Misc. Devices (PULSE OXIMETER FOR FINGER) MISC 1 Device by Does not apply route as needed. 1 each 0   mometasone-formoterol (DULERA) 200-5 MCG/ACT AERO Inhale 2 puffs into the lungs 2 (two) times daily.     potassium chloride SA (K-DUR) 20 MEQ tablet Take 1 tablet (20 mEq total) by mouth daily. 30 tablet 3   losartan (COZAAR) 100 MG tablet Take 1 tablet (100 mg total) by mouth daily. (Patient not taking: Reported on 04/18/2019) 90 tablet 0   No current facility-administered medications for this visit.       Physical Exam:   BP 107/77 (BP Location: Right Arm, Patient Position: Sitting, Cuff Size: Normal)    Pulse 93    Temp (!) 97.5 F (36.4 C) Comment: THERMAL   Resp 16    Ht 5\' 1"  (1.549 m)    Wt 141 lb (64  kg)    SpO2 96% Comment: RA   BMI 26.64 kg/m   General:  Well-appearing  Chest:   Clear to auscultation with symmetrical breath sounds  CV:   Regular rate and rhythm with prominent holosystolic murmur  Incisions:  n/a  Abdomen:  Soft nontender  Extremities:  Warm and well-perfused  Diagnostic Tests:  Results for Katie [name], Katie [name] (MRN [id number]) as of 04/18/2019 12:57  Ref. Range 04/18/2019 10:40  WBC Latest Ref Range: 4.0 - 10.5 K/uL 7.2     Impression:  Patient has mitral valve prolapse with stage D severe symptomatic primary mitral regurgitation. She presents with recent exacerbation of symptoms of exertional shortness of breath and occasional resting shortness of breath witnorthopnea consistent with  acute on chronic diastolic congestive heart failure, New York Heart Association functional class III-IV. She was treated recently for presumed acute exacerbation of chronic bronchitis. Although her cough has improved she has remained quite limited since hospital discharge with at least class III symptoms of dyspnea.  Since her last office visit her cough has cleared up considerably.  She has no fevers and her white blood count is normal.  Follow-up chest x-ray is pending.  I have personally reviewed the patient's recent echocardiograms, diagnostic cardiac catheterization, PFT's and CT angiogram. The patient has mitral valve prolapse with severe prolapse involving a portion of the middle scallop of the posterior leaflet. There are ruptured primary chordae tendinaeand a flail segment of the posterior leaflet causing severe mitral regurgitation. Left ventricular systolic function appears normal. Diagnostic cardiac catheterization is notable for the absence of significant coronary artery disease and right heart pressures remain normal.  PFTs reveal findings consistent with moderate to severe COPD.  There was hyperinflation and improvement in airway flow following bronchodilator administration.  CT angiography revealed parenchymal changes in the lung consistent with emphysema, a small right pleural effusion, and no contraindications to peripheral cannulation for surgery, although there is a small infrarenal abdominal aortic aneurysm without any complicating features.    Plan:  The patient was again counseled at length regarding the indications, risks and potential benefits of mitral valve repair.  The rationale for elective surgery has been explained, including a comparison between surgery and continued medical therapy with close follow-up.  The likelihood of successful and durable mitral valve repair has been discussed with particular reference to the findings of their recent echocardiogram.  Based upon  these findings and previous experience, I have quoted them a greater than 95 percent likelihood of successful valve repair with less than 1 percent risk of mortality or major morbidity.  She does understand that she will be at increased risk for the development of postoperative pneumonia and/or respiratory failure.  Alternative surgical approaches have been discussed including a comparison between conventional sternotomy and minimally-invasive techniques.  The relative risks and benefits of each have been reviewed as they pertain to the patient's specific circumstances, and expectations for the patient's postoperative convalescence has been discussed.  We also discussed the presence of her abdominal aortic aneurysm and potential risks related to the use of peripheral cannulation for surgery.  The patient desires to proceed with minimally invasive mitral valve repair this week.    The patient understands and accepts all potential risks of surgery including but not limited to risk of death, stroke or other neurologic complication, myocardial infarction, congestive heart failure, respiratory failure, renal failure, bleeding requiring transfusion and/or reexploration, arrhythmia, infection or other wound complications, pneumonia, pleural and/or pericardial effusion, pulmonary embolus, aortic dissection or other  major vascular complication, or delayed complications related to valve repair or replacement including but not limited to structural valve deterioration and failure, thrombosis, embolization, endocarditis, or paravalvular leak.  Specific risks potentially related to the minimally-invasive approach were discussed at length, including but not limited to risk of conversion to full or partial sternotomy, aortic dissection or other major vascular complication, unilateral acute lung injury or pulmonary edema, phrenic nerve dysfunction or paralysis, rib fracture, chronic pain, lung hernia, or lymphocele. All of her  questions have been answered.  Expectations for her postoperative convalescence have been discussed.     I spent in excess of 15 minutes during the conduct of this office consultation and >50% of this time involved direct face-to-face encounter with the patient for counseling and/or coordination of their care.    Valentina Gu. Roxy Manns, MD 04/18/2019 12:42 PM   Addendum:  I have reviewed the patient's follow-up x-ray performed this afternoon and discussed the results over the telephone with the patient and her daughter.  Today's chest x-ray demonstrates opacity in the left upper lobe which is consistent with pneumonia and slightly more prominent than the x-ray performed 2 weeks ago.  Clinically the patient is doing much better and her white blood count is normal.  Her cough has improved.  I discussed the fact that radiographic findings of pneumonia oftentimes resolve much more slowly than the patient's clinical condition.  We discussed options including postponing surgery for at least a few more weeks versus proceeding with surgery at this time.  Patient is adamant that she wishes to proceed with surgery without any further delay.  She understands that there may be some increased risk of respiratory difficulties following surgery.  All of her questions have been addressed.   Rexene Alberts, MD 04/18/2019 4:56 PM

## 2019-04-18 NOTE — Patient Instructions (Signed)
   Continue taking all current medications without change through the day before surgery.  Have nothing to eat or drink after midnight the night before surgery.  On the morning of surgery do not take any medications. You may use your inhaler.

## 2019-04-18 NOTE — Pre-Procedure Instructions (Addendum)
   Katie [name]  04/18/2019     [place] [facility] [place] - [address] [place]) [place] [address] Phone: [phone] Fax: [phone]   Your procedure is scheduled on Wednesday, April 20, 2019  Report to [facility] Admitting at 6:30 A.M.  Call this number if you have problems the morning of surgery:  [phone]   Remember: Brush your teeth the morning of surgery with your regular toothpaste.  Do not eat or drink after midnight Tuesday, April 19, 2019  Take these medicines the morning of surgery with A SIP OF WATER: metoprolol tartrate (LOPRESSOR), mometasone-formoterol (DULERA) inhaler If needed: Tylenol for pain  albuterol (PROVENTIL) nebulizer solution for wheezing or shortness of breath.  Stop taking Aspirin (unless otherwise advised by surgeon), vitamins, fish oil and herbal medications. Do not take any NSAIDs ie: Ibuprofen, Advil, Naproxen (Aleve), Motrin, BC and Goody Powder; stop now.    Do not wear jewelry, make-up or nail polish.  Do not wear lotions, powders, or perfumes, or deodorant.  Do not shave 48 hours prior to surgery.   Do not bring valuables to the hospital.  [facility] is not responsible for any belongings or valuables.  Contacts, dentures or bridgework may not be worn into surgery.  Patients discharged the day of surgery will not be allowed to drive home.  Special instructions: Shower the night before and morning of surgery with CHG. Please read over the following fact sheets that you were given. Pain Booklet, Coughing and Deep Breathing and Surgical Site Infection Prevention

## 2019-04-18 NOTE — Progress Notes (Signed)
Nurse spoke with Katie Butts, RN ( Surgical Coordinator) to clarify repeat labs. Per Katie Butts, do not repeat all labs " just the 3 that were ordered, CBC BMP and T&S. "

## 2019-04-18 NOTE — H&P (Signed)
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     +------------------+------------++   AV Area (VTI):  0.66 cm     +------------------+------------++   AV Vmax:  435.00 cm/s     +------------------+------------++   AV Vmean:  310.000 cm/s    +------------------+------------++   AV VTI:  0.949 m     +------------------+------------++   AV Peak Grad:  75.7 mmHg     +------------------+------------++   AV Mean Grad:  43.0 mmHg     +------------------+------------++   LVOT Vmax:  133.00 cm/s     +------------------+------------++   LVOT Vmean:  79.400 cm/s     +------------------+------------++   LVOT VTI:  0.222 m     +------------------+------------++   LVOT/AV VTI ratio: 0.23     +------------------+------------++  +-------------+-------++   AORTA       +-------------+-------++   Ao  Root  diam: 2.70 cm    +-------------+-------++  +--------------+----------++   MITRAL VALVE      +--------------+-------+  +--------------+----------++  SHUNTS       MV Area (PHT): 3.12 cm    +--------------+-------+  +--------------+----------++  Systemic VTI:  0.22 m     MV PHT:  70.47 msec   +--------------+-------+  +--------------+----------++  Systemic Diam: 1.90 cm    MV Decel Time: 243 msec    +--------------+-------+  +--------------+----------++  +--------------+-----------++   MV E velocity: 162.00 cm/s    +--------------+-----------++   MV A velocity: 104.00 cm/s    +--------------+-----------++   MV E/A ratio:  1.56     +--------------+-----------++  Kirk Ruths MD  Electronically signed by Kirk Ruths MD  Signature Date/Time: 03/13/2019/12:19:12 PM  TRANSESOPHOGEAL ECHO REPORT  Patient Name: Lorrin Mais Date of Exam: 03/18/2019  Medical Rec #: [id number] Height: 61.0 in  Accession #: [id number] Weight: 143.3 lb  Date of Birth: 03-22-1946 BSA: 1.64 m  Patient Age: 51 years BP: 103/71 mmHg  Patient Gender: F HR: 78 bpm.  Exam Location: Inpatient  Procedure: 2D Echo, Cardiac Doppler, Color Doppler and 3D Echo  Indications: Mitral Insuffiency  History: Patient has prior history of Echocardiogram examinations, most  recent 03/13/2019. Mitral Valve Prolapse, Hypertension, COPD,  Respritory Failure, COPD, Respritory Acidosis, Hyperlipidemia,  Chronic Kidney Disease, Mitral Insuffiency, Tobacco Abuse.  Sonographer: Madelaine Etienne RDCS (AE)  Referring Phys: [id number] Abigail Butts  Diagnosing Phys: [name] MD  PROCEDURE: Normal Transesophogeal exam. After discussion of the risks and benefits of a TEE, an informed consent was obtained from the patient. The transesophogeal probe was passed through the esophogus of the patient. Imaged were obtained with the  patient in a left lateral decubitus position. Image quality was excellent. The patient developed no  complications during the procedure.  IMPRESSIONS  1. The right ventricle has normal systolc function.  2. LA appendage without masses.  3. The mitral valve is abnormal. Mild thickening of the mitral valve leaflet. Mitral valve regurgitation is severe by color flow Doppler.  4. Mitral regurgitation is severe. The P2 secment appears partially flail with severe prolapse into the LA. MR is projected anteriorly around to back of LA.  5. Tricuspid valve regurgitation is mild-moderate.  6. Peak TR velocity approximately 4 m/sec.  7. The aortic root is normal in size and structure.  8. The left ventricle has normal systolic function, with an ejection fraction of 55-60%.  FINDINGS  Left Ventricle: The left ventricle has normal systolic function, with an ejection fraction of 55-60%.  Right Ventricle: The right ventricle has normal systolic function.  Left Atrium: LA appendage without masses.  Right Atrium: Right atrial size was normal in size. Right atrial pressure is estimated at 10 mmHg.  Interatrial Septum: No atrial level shunt detected by color flow Doppler. Agitated saline contrast was given intravenously to evaluate for intracardiac shunting.  Mitral Valve: The mitral valve is abnormal. Mild thickening of the mitral valve leaflet. Mitral valve regurgitation is severe by color flow Doppler. Mitral regurgitation is severe. The P2 secment appears partially flail with severe prolapse into the LA.  MR is projected anteriorly around to back of LA.  Tricuspid Valve: Tricuspid valve regurgitation is mild-moderate by color flow Doppler. Peak TR velocity approximately 4 m/sec.  Aortic Valve: The aortic valve is normal in structure. Aortic valve regurgitation was not visualized by color flow Doppler.  Pulmonic Valve: The pulmonic valve was grossly normal. Pulmonic valve regurgitation is  mild by color flow Doppler.  Aorta: The aortic root is normal in size and structure.  [name] MD  Electronically signed  by [name] MD  Signature Date/Time: 03/18/2019/7:53:09 PM  RIGHT/LEFT HEART CATH AND CORONARY ANGIOGRAPHY  Conclusion  Prox LAD lesion is 25% stenosed.  Prox Cx to Mid Cx lesion is 25% stenosed.  The left ventricular systolic function is normal.  LV end diastolic pressure is mildly elevated. LVEDP 17 mm Hg.  The left ventricular ejection fraction is 55-65% by visual estimate.  There is severe (4+) mitral regurgitation.  There is no aortic valve stenosis.  Ao sat 97%, PA sat 68%, CO 5.7 L/min; CI 3.4, mean PA 25 mm Hg, mean PCWP 20 mm Hg; prominent V waves noted on wedge tracing Nonobstructive CAD. Severe mitral regurgitation.  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   Recommendations  Discharge Date In the absence of any other complications or medical issues, we expect the patient to be ready for discharge from a cath perspective on 03/22/2019.  Surgeon Notes    03/18/2019 10:18 AM CV Procedure signed by Fay Records, MD  Indications  Severe mitral regurgitation [I34.0 (ICD-10-CM)]  Procedural Details  Technical Details The risks, benefits, and details of the procedure were explained to the patient. The patient verbalized understanding and wanted to proceed. Informed written consent was obtained.  PROCEDURE TECHNIQUE: After Xylocaine anesthesia, a 5 French sheath was placed in the right antecubital area in exchange for a peripheral IV. A 5 French balloontipped Swan-Ganz catheter was advanced to the pulmonary artery under fluoroscopic guidance. Hemodynamic pressures were obtained. Oxygen saturations were obtained. After Xylocaine anesthesia, a 53F sheath was placed in the right radial artery with a single anterior needle wall stick. Left coronary angiography was done using a Judkins L3.5 guide catheter. Right coronary angiography was done using a Judkins R4 guide catheter. Left heart cath was done using a JR4 catheter.   Abdominal aortogram was attempted. 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 Glide was was advanced to the descending aorta but we could not get a catheter to follow due to the angulation.     Contrast: 100 cc  Estimated blood loss <50 mL.   During this procedure medications were administered to achieve and maintain moderate conscious sedation while the patient's heart rate, blood pressure, and oxygen saturation were continuously monitored and I was present face-to-face 100% of this time.  Medications  (Filter: Administrations occurring from 03/22/19 1114 to 03/22/19 1245)          Medication Rate/Dose/Volume Action  Date Time   fentaNYL (SUBLIMAZE) injection (mcg) 25 mcg Given 03/22/19 1138   Total dose as of 03/25/19 1314 25 mcg Given 1208   50 mcg        midazolam (VERSED) injection (mg) 1 mg Given 03/22/19 1139   Total dose as of 03/25/19 1314        1 mg        lidocaine (PF) (XYLOCAINE) 1 % injection (mL) 2 mL Given 03/22/19 1139   Total dose as of 03/25/19 1314 5 mL Given 1140   7 mL        Radial Cocktail/Verapamil only (mL) 10 mL Given 03/22/19 1145   Total dose as of 03/25/19 1314        10 mL        heparin injection (Units) 3,500 Units Given 03/22/19 1153   Total dose as of 03/25/19 1314  3,500 Units        Radial Cocktail/Verapamil only (mL) 10 mL Given 03/22/19 1216   Total dose as of 03/25/19 1314        10 mL        Heparin (Porcine) in NaCl 1000-0.9 UT/500ML-% SOLN (mL) 500 mL Given 03/22/19 1236   Total dose as of 03/25/19 1314 500 mL Given 1236   1,000 mL        iohexol (OMNIPAQUE) 350 MG/ML injection (mL) 55 mL Given 03/22/19 1240   Total dose as of 03/25/19 1314        55 mL        aspirin chewable tablet 81 mg (mg) *81 mg Not Given 03/22/19 1231   Dosing weight: 64.9 kg        Total dose as of 03/25/19 1314        0 mg        *Administration not included in total        Sedation Time  Sedation Time Physician-1: 44 minutes 44 seconds  Complications  Complications documented before  study signed (03/22/2019 12:50 PM EDT)   No complications were associated with this study.  Documented by [name], MD - 03/22/2019 12:47 PM EDT  Coronary Findings  Diagnostic  Dominance: Right  Left Anterior Descending  Prox LAD lesion 25% stenosed  Prox LAD lesion is 25% stenosed.  Left Circumflex  Prox Cx to Mid Cx lesion 25% stenosed  Prox Cx to Mid Cx lesion is 25% stenosed.  Right Coronary Artery  There is mild diffuse disease throughout the vessel.  Intervention  No interventions have been documented.  Right Heart  Right Heart Pressures Ao sat 97%, PA sat 68%, CO 5.7 L/min; CI 3.4, mean PA 25 mm Hg, mean PCWP 20 mm Hg; prominent V waves noted on wedge tracing  Wall Motion        Resting              Left Heart  Left Ventricle The left ventricular size is normal. The left ventricular systolic function is normal. LV end diastolic pressure is mildly elevated. The left ventricular ejection fraction is 55-65% by visual estimate. No regional wall motion abnormalities. There is severe (4+) mitral regurgitation.  Aortic Valve There is no aortic valve stenosis.  Coronary Diagrams  Diagnostic  Dominance: Right   Intervention  Implants     No implant documentation for this case.  Syngo Images  Link to Procedure Log   Show images for CARDIAC CATHETERIZATION Procedure Log  Images on Long Term Storage    Show images for [place], [place]annin   [facility] Data   Most Recent Value  Fick Cardiac Output 5.7 L/min  Fick Cardiac Output Index 3.48 (L/min)/BSA  RA A Wave 9 mmHg  RA V Wave 8 mmHg  RA Mean 6 mmHg  RV Systolic Pressure 38 mmHg  RV Diastolic Pressure 3 mmHg  RV EDP 7 mmHg  PA Systolic Pressure 42 mmHg  PA Diastolic Pressure 13 mmHg  PA Mean 25 mmHg  PW A Wave 20 mmHg  PW V Wave 40 mmHg  PW Mean 20 mmHg  AO Systolic Pressure 87 mmHg  AO Diastolic Pressure 61 mmHg  AO Mean 73 mmHg  LV Systolic Pressure 84 mmHg  LV Diastolic Pressure 0 mmHg  LV EDP 17 mmHg  AOp  Systolic Pressure 78 mmHg  AOp Diastolic Pressure 53 mmHg  AOp Mean Pressure 65 mmHg  LVp Systolic Pressure 77  mmHg  LVp Diastolic Pressure 8 mmHg  LVp EDP Pressure 16 mmHg  QP/QS 1  TPVR Index 7.19 HRUI  TSVR Index 18.7 HRUI  PVR SVR Ratio 0.08  TPVR/TSVR Ratio 0.38     Pulmonary Function Tests  Baseline                                                                      Post-bronchodilator  FVC                 1.24 L  (47% predicted)          FVC                 1.51 L  (57% predicted) FEV1               0.65 L  (33% predicted)          FEV1               0.84 L  (42% predicted) FEF25-75        0.28 L  (16% predicted)          FEF25-75        0.51 L  (30% predicted)  TLC                 6.26 L  (135% predicted) RV                   4.77 L  (231% predicted) DLCO              N/A % predicted    CT ANGIOGRAPHY CHEST, ABDOMEN AND PELVIS  TECHNIQUE: Multidetector CT imaging through the chest, abdomen and pelvis was performed using the standard protocol during bolus administration of intravenous contrast. Multiplanar reconstructed images and MIPs were obtained and reviewed to evaluate the vascular anatomy.  CONTRAST: 163mL OMNIPAQUE IOHEXOL 350 MG/ML SOLN  COMPARISON: Chest CT from 05/24/2018 and chest radiograph from 03/14/2019  FINDINGS: CTA CHEST FINDINGS  Cardiovascular: Noncontrast images demonstrate atherosclerotic calcification aortic arch, branch vessels, and left anterior descending coronary artery but no findings of acute intramural hematoma. Low-density blood pool suggests anemia.  Postcontrast images demonstrate no findings of thoracic dissection. Mild cardiomegaly is present and there is a suggestion of some thickening of the mitral valve leaflets. We have a reasonably well opacified pulmonary arterial tree without findings of filling defect in the pulmonary arteries. No pericardial effusion.  Mediastinum/Nodes: A hypodense structure in  the periaortic region measuring 0.9 cm in short axis on image 94/7 probably represents a lymph node. There is some trace right paraesophageal fluid which could be in the pleural space but also could be in the mediastinum just above the hiatus.  Lungs/Pleura: Small bilateral pleural effusions with associated passive atelectasis. These effusions are nonspecific for transudative or exudative etiology.  Biapical scarring is essentially stable from 05/24/2018. There are some indistinctly marginated ground-glass opacities in both lungs suggesting mild edema. This is most confluent in the superior segment right lower lobe for example on image 62/9. Mild bilateral airway thickening. Suspected centrilobular emphysema.  Musculoskeletal: Unremarkable  Review of the MIP images confirms the above findings.  CTA ABDOMEN AND PELVIS FINDINGS  VASCULAR  Aorta: Fusiform infrarenal abdominal  aortic aneurysm 3.8 cm in diameter on image 158/7 with associated mural thrombus. Abdominal aortic ectasia extends down to the bifurcation.  Celiac: Patent celiac artery.  SMA: Patent. The common hepatic artery arises from the SMA, a vascular variant.  Renals: Patent single left renal artery. Right nephrectomy.  IMA: Patent.  Inflow: Atheromatous common iliac arteries but widely patent.  Veins: No appreciable abnormality.  Review of the MIP images confirms the above findings.  NON-VASCULAR  Hepatobiliary: 4.1 by 2.8 cm hypodense lesion in the dome of the right hepatic lobe on image 115/7, this previously measured 3.2 by 2.4 cm on 01/21/2010 and has a cystic appearance. A smaller cyst in the lateral segment left hepatic lobe measures 1.1 cm in diameter, essentially stable from 01/21/2010. A hypodense lesion inferiorly in the lateral segment left hepatic lobe measures 1.1 cm in diameter on image 53/5, formerly 1.0 cm in diameter back in 2011. Other small hypodense liver lesions  likewise are statistically likely to be small cysts or similar benign lesions, although technically nonspecific. A 1.1 by 1.0 cm hypodense lesion in the right hepatic lobe on image 152/7 is enlarged compared to the 2011 exam.  The gallbladder is absent. Mild prominence of the extrahepatic biliary tree is likely from physiologic response to cholecystectomy.  Pancreas: Slight lobularity of the posterior margin of the pancreatic tail not changed from 2011, considered benign. I suspect pancreas divisum.  Spleen: Unremarkable  Adrenals/Urinary Tract: Poor definition of the right adrenal gland. Right nephrectomy noted. The left adrenal gland appears normal. No left renal abnormality is identified.  Stomach/Bowel: Scattered descending colon diverticula. Upper normal diameter small bowel loops with scattered air-fluid levels. I do not see an obvious lead point for obstruction.  Lymphatic: Unremarkable  Reproductive: Unremarkable  Other: No supplemental non-categorized findings.  Musculoskeletal: 60% anterior wedge compression fracture at L3, age indeterminate.  Review of the MIP images confirms the above findings.  IMPRESSION: 1. No dissection or compelling findings of pulmonary embolus. 2. Infrarenal abdominal aortic aneurysm 3.8 cm in diameter. Recommend followup by ultrasound in 2 years. This recommendation follows ACR consensus guidelines: White Paper of the ACR Incidental Findings Committee II on Vascular Findings. J Am Coll Radiol 2013; 10:789-794. Aortic aneurysm NOS (ICD10-I71.9). 3. Mild cardiomegaly with a suggestion of thickening of the mitral valve leaflets. 4. Small bilateral pleural effusions with associated passive atelectasis. 5. Patchy ground-glass opacities in the lungs, indistinctly marginated, most confluent in the superior segment right lower lobe. Given the hazy background lung opacity these may reflect pulmonary edema. 6. Airway thickening is  present, suggesting bronchitis or reactive airways disease. 7. Suspected centrilobular emphysema. Emphysema (ICD10-J43.9). 8. Aortic Atherosclerosis (ICD10-I70.0). 9. Hypodense liver lesions are likely cysts and for the most part are stable from 2011. A 1.1 cm hypodense lesion in the right hepatic lobe has enlarged compared to the 2011 exam but is still most likely a cyst. 10. Right nephrectomy. 11. 60% anterior wedge compression fracture at L3, age indeterminate.   Electronically Signed By: Van Clines M.D. On: 03/29/2019 17:42    Impression:  Patient has mitral valve prolapse with stage D severe symptomatic primary mitral regurgitation. She presents with recent exacerbation of symptoms of exertional shortness of breath and occasional resting shortness of breath witnorthopnea consistent with acute on chronic diastolic congestive heart failure, [place] Heart Association functional class III-IV. She was treated recently for presumed acute exacerbation of chronic bronchitis. Although her cough has improved she has remained quite limited since hospital discharge with at least  class III symptoms of dyspnea.  Since her last office visit her cough has cleared up considerably.  She has no fevers and her white blood count is normal.  Follow-up chest x-ray is pending.  I have personally reviewed the patient's recent echocardiograms,diagnostic cardiac catheterization, PFT's and CT angiogram. The patient has mitral valve prolapse with severe prolapse involving a portion of the middle scallop of the posterior leaflet. There are ruptured primary chordae tendinaeand a flail segment of the posterior leaflet causing severe mitral regurgitation. Left ventricular systolic function appears normal. Diagnostic cardiac catheterization is notable for the absence of significant coronary artery disease and right heart pressures remain normal.PFTs reveal findings consistent with moderate  to severe COPD. There was hyperinflation and improvement in airway flow following bronchodilator administration. CT angiography revealed parenchymal changes in the lung consistent with emphysema, a small right pleural effusion,and no contraindications to peripheral cannulation for surgery,althoughthere is a small infrarenal abdominal aortic aneurysm without any complicating features.    Plan:  The patient wasagaincounseled at length regarding the indications, risks and potential benefits of mitral valve repair. The rationale for elective surgery has been explained, including a comparison between surgery and continued medical therapy with close follow-up. The likelihood of successful and durable mitral valve repair has been discussed with particular reference to the findings of their recent echocardiogram. Based upon these findings and previous experience, I have quoted them a greater than 95percent likelihood of successful valve repair with less than 1percent risk of mortality or major morbidity. She does understand that she will be at increased risk for the development of postoperative pneumonia and/or respiratory failure. Alternative surgical approaches have been discussed including a comparison between conventional sternotomy and minimally-invasive techniques. The relative risks and benefits of each have been reviewed as they pertain to the patient's specific circumstances, and expectations for the patient's postoperative convalescence has been discussed. We also discussed the presence of her abdominal aortic aneurysm and potential risks related to the use of peripheral cannulation for surgery. The patient desiresto proceed with minimally invasive mitral valve repair this week.   The patient understands and accepts all potential risks of surgery including but not limited to risk of death, stroke or other neurologic complication, myocardial infarction, congestive heart failure,  respiratory failure, renal failure, bleeding requiring transfusion and/or reexploration, arrhythmia, infection or other wound complications, pneumonia, pleural and/or pericardial effusion, pulmonary embolus, aortic dissection or other major vascular complication, or delayed complications related to valve repair or replacement including but not limited to structural valve deterioration and failure, thrombosis, embolization, endocarditis, or paravalvular leak. Specific risks potentially related to the minimally-invasive approach were discussed at length, including but not limited to risk of conversion to full or partial sternotomy, aortic dissection or other major vascular complication, unilateral acute lung injury or pulmonary edema, phrenic nerve dysfunction or paralysis, rib fracture, chronic pain, lung hernia, or lymphocele. All of herquestions have been answered.  Expectations for her postoperative convalescence have been discussed.     Valentina Gu. Roxy Manns, MD 04/18/2019 12:42 PM

## 2019-04-19 ENCOUNTER — Ambulatory Visit: Payer: Medicare Other | Admitting: Physician Assistant

## 2019-04-19 MED ORDER — SODIUM CHLORIDE 0.9 % IV SOLN
750.0000 mg | INTRAVENOUS | Status: DC
  Filled 2019-04-19: qty 750

## 2019-04-19 MED ORDER — PLASMA-LYTE 148 IV SOLN
INTRAVENOUS | Status: DC
  Filled 2019-04-19: qty 2.5

## 2019-04-19 MED ORDER — TRANEXAMIC ACID (OHS) PUMP PRIME SOLUTION
2.0000 mg/kg | INTRAVENOUS | Status: DC
  Filled 2019-04-19: qty 1.28

## 2019-04-19 MED ORDER — VANCOMYCIN HCL 1000 MG IV SOLR
INTRAVENOUS | Status: DC
  Filled 2019-04-19: qty 1000

## 2019-04-19 MED ORDER — NITROGLYCERIN IN D5W 200-5 MCG/ML-% IV SOLN
2.0000 ug/min | INTRAVENOUS | Status: DC
  Filled 2019-04-19: qty 250

## 2019-04-19 MED ORDER — SODIUM CHLORIDE 0.9 % IV SOLN
1.5000 g | INTRAVENOUS | Status: AC
  Administered 2019-04-20: 1.5 g via INTRAVENOUS
  Administered 2019-04-20: .75 g via INTRAVENOUS
  Filled 2019-04-19: qty 1.5

## 2019-04-19 MED ORDER — INSULIN REGULAR(HUMAN) IN NACL 100-0.9 UT/100ML-% IV SOLN
INTRAVENOUS | Status: AC
  Administered 2019-04-20: .6 [IU]/h via INTRAVENOUS
  Filled 2019-04-19: qty 100

## 2019-04-19 MED ORDER — PHENYLEPHRINE HCL-NACL 20-0.9 MG/250ML-% IV SOLN
30.0000 ug/min | INTRAVENOUS | Status: DC
  Filled 2019-04-19: qty 250

## 2019-04-19 MED ORDER — SODIUM CHLORIDE 0.9 % IV SOLN
INTRAVENOUS | Status: DC
  Filled 2019-04-19: qty 30

## 2019-04-19 MED ORDER — EPINEPHRINE PF 1 MG/ML IJ SOLN
0.0000 ug/min | INTRAVENOUS | Status: DC
  Filled 2019-04-19: qty 4

## 2019-04-19 MED ORDER — POTASSIUM CHLORIDE 2 MEQ/ML IV SOLN
80.0000 meq | INTRAVENOUS | Status: DC
  Filled 2019-04-19: qty 40

## 2019-04-19 MED ORDER — MANNITOL 20 % IV SOLN
Freq: Once | INTRAVENOUS | Status: DC
  Filled 2019-04-19: qty 13

## 2019-04-19 MED ORDER — TRANEXAMIC ACID 1000 MG/10ML IV SOLN
1.5000 mg/kg/h | INTRAVENOUS | Status: AC
  Administered 2019-04-20: 945 mg via INTRAVENOUS
  Filled 2019-04-19: qty 25

## 2019-04-19 MED ORDER — TRANEXAMIC ACID (OHS) BOLUS VIA INFUSION
15.0000 mg/kg | INTRAVENOUS | Status: DC
  Filled 2019-04-19: qty 960

## 2019-04-19 MED ORDER — DEXMEDETOMIDINE HCL IN NACL 400 MCG/100ML IV SOLN
0.1000 ug/kg/h | INTRAVENOUS | Status: AC
  Administered 2019-04-20: 12:00:00 0.7 ug/kg/h via INTRAVENOUS
  Filled 2019-04-19: qty 100

## 2019-04-19 MED ORDER — GLUTARALDEHYDE 0.625% SOAKING SOLUTION
TOPICAL | Status: DC
  Filled 2019-04-19: qty 50

## 2019-04-19 MED ORDER — DOPAMINE-DEXTROSE 3.2-5 MG/ML-% IV SOLN
0.0000 ug/kg/min | INTRAVENOUS | Status: DC
  Filled 2019-04-19: qty 250

## 2019-04-19 MED ORDER — NOREPINEPHRINE 4 MG/250ML-% IV SOLN
0.0000 ug/min | INTRAVENOUS | Status: DC
  Filled 2019-04-19: qty 250

## 2019-04-19 MED ORDER — VANCOMYCIN HCL 10 G IV SOLR
1250.0000 mg | INTRAVENOUS | Status: AC
  Administered 2019-04-20: 1250 mg via INTRAVENOUS
  Filled 2019-04-19: qty 1250

## 2019-04-19 MED ORDER — MAGNESIUM SULFATE 50 % IJ SOLN
40.0000 meq | INTRAMUSCULAR | Status: DC
  Filled 2019-04-19: qty 9.85

## 2019-04-19 MED ORDER — MILRINONE LACTATE IN DEXTROSE 20-5 MG/100ML-% IV SOLN
0.3000 ug/kg/min | INTRAVENOUS | Status: DC
  Filled 2019-04-19: qty 100

## 2019-04-20 ENCOUNTER — Encounter (HOSPITAL_COMMUNITY)
Admission: RE | Disposition: A | Discharge status: Home / Self Care | Payer: Self-pay | Source: Home / Self Care | Attending: Thoracic Surgery (Cardiothoracic Vascular Surgery)

## 2019-04-20 ENCOUNTER — Encounter (HOSPITAL_COMMUNITY): Payer: Self-pay | Admitting: Registered Nurse

## 2019-04-20 ENCOUNTER — Inpatient Hospital Stay (HOSPITAL_COMMUNITY): Payer: Medicare Other

## 2019-04-20 ENCOUNTER — Other Ambulatory Visit: Payer: Self-pay

## 2019-04-20 ENCOUNTER — Inpatient Hospital Stay (HOSPITAL_COMMUNITY): Payer: Medicare Other | Admitting: Physician Assistant

## 2019-04-20 ENCOUNTER — Inpatient Hospital Stay (HOSPITAL_COMMUNITY)
Admission: RE | Admit: 2019-04-20 | Discharge: 2019-04-26 | DRG: 220 | Disposition: A | Discharge status: Home / Self Care | Payer: Medicare Other | Attending: Thoracic Surgery (Cardiothoracic Vascular Surgery) | Admitting: Thoracic Surgery (Cardiothoracic Vascular Surgery)

## 2019-04-20 ENCOUNTER — Inpatient Hospital Stay (HOSPITAL_COMMUNITY): Payer: Medicare Other | Admitting: Certified Registered Nurse Anesthetist

## 2019-04-20 DIAGNOSIS — I2510 Atherosclerotic heart disease of native coronary artery without angina pectoris: Secondary | ICD-10-CM | POA: Diagnosis present

## 2019-04-20 DIAGNOSIS — J984 Other disorders of lung: Secondary | ICD-10-CM | POA: Diagnosis not present

## 2019-04-20 DIAGNOSIS — D62 Acute posthemorrhagic anemia: Secondary | ICD-10-CM | POA: Diagnosis not present

## 2019-04-20 DIAGNOSIS — N183 Chronic kidney disease, stage 3 unspecified: Secondary | ICD-10-CM | POA: Diagnosis present

## 2019-04-20 DIAGNOSIS — I340 Nonrheumatic mitral (valve) insufficiency: Secondary | ICD-10-CM | POA: Diagnosis not present

## 2019-04-20 DIAGNOSIS — Z85528 Personal history of other malignant neoplasm of kidney: Secondary | ICD-10-CM

## 2019-04-20 DIAGNOSIS — Z1159 Encounter for screening for other viral diseases: Secondary | ICD-10-CM | POA: Diagnosis not present

## 2019-04-20 DIAGNOSIS — Z79899 Other long term (current) drug therapy: Secondary | ICD-10-CM

## 2019-04-20 DIAGNOSIS — I10 Essential (primary) hypertension: Secondary | ICD-10-CM | POA: Diagnosis present

## 2019-04-20 DIAGNOSIS — R0902 Hypoxemia: Secondary | ICD-10-CM | POA: Diagnosis not present

## 2019-04-20 DIAGNOSIS — Z006 Encounter for examination for normal comparison and control in clinical research program: Secondary | ICD-10-CM | POA: Diagnosis not present

## 2019-04-20 DIAGNOSIS — J9811 Atelectasis: Secondary | ICD-10-CM | POA: Diagnosis not present

## 2019-04-20 DIAGNOSIS — J439 Emphysema, unspecified: Secondary | ICD-10-CM | POA: Diagnosis not present

## 2019-04-20 DIAGNOSIS — E7800 Pure hypercholesterolemia, unspecified: Secondary | ICD-10-CM | POA: Diagnosis present

## 2019-04-20 DIAGNOSIS — I4891 Unspecified atrial fibrillation: Secondary | ICD-10-CM | POA: Diagnosis not present

## 2019-04-20 DIAGNOSIS — E872 Acidosis: Secondary | ICD-10-CM | POA: Diagnosis not present

## 2019-04-20 DIAGNOSIS — N1831 Chronic kidney disease, stage 3a: Secondary | ICD-10-CM | POA: Diagnosis present

## 2019-04-20 DIAGNOSIS — I714 Abdominal aortic aneurysm, without rupture: Secondary | ICD-10-CM | POA: Diagnosis present

## 2019-04-20 DIAGNOSIS — F17200 Nicotine dependence, unspecified, uncomplicated: Secondary | ICD-10-CM | POA: Diagnosis present

## 2019-04-20 DIAGNOSIS — Z4889 Encounter for other specified surgical aftercare: Secondary | ICD-10-CM | POA: Diagnosis not present

## 2019-04-20 DIAGNOSIS — Z7982 Long term (current) use of aspirin: Secondary | ICD-10-CM | POA: Diagnosis not present

## 2019-04-20 DIAGNOSIS — J449 Chronic obstructive pulmonary disease, unspecified: Secondary | ICD-10-CM | POA: Diagnosis present

## 2019-04-20 DIAGNOSIS — E785 Hyperlipidemia, unspecified: Secondary | ICD-10-CM | POA: Diagnosis present

## 2019-04-20 DIAGNOSIS — K219 Gastro-esophageal reflux disease without esophagitis: Secondary | ICD-10-CM | POA: Diagnosis present

## 2019-04-20 DIAGNOSIS — Z7951 Long term (current) use of inhaled steroids: Secondary | ICD-10-CM

## 2019-04-20 DIAGNOSIS — Z905 Acquired absence of kidney: Secondary | ICD-10-CM

## 2019-04-20 DIAGNOSIS — I5033 Acute on chronic diastolic (congestive) heart failure: Secondary | ICD-10-CM | POA: Diagnosis present

## 2019-04-20 DIAGNOSIS — Z9889 Other specified postprocedural states: Secondary | ICD-10-CM

## 2019-04-20 DIAGNOSIS — I272 Pulmonary hypertension, unspecified: Secondary | ICD-10-CM | POA: Diagnosis present

## 2019-04-20 DIAGNOSIS — I5032 Chronic diastolic (congestive) heart failure: Secondary | ICD-10-CM | POA: Diagnosis present

## 2019-04-20 DIAGNOSIS — Z954 Presence of other heart-valve replacement: Secondary | ICD-10-CM | POA: Diagnosis not present

## 2019-04-20 DIAGNOSIS — R918 Other nonspecific abnormal finding of lung field: Secondary | ICD-10-CM | POA: Diagnosis not present

## 2019-04-20 DIAGNOSIS — I341 Nonrheumatic mitral (valve) prolapse: Secondary | ICD-10-CM | POA: Diagnosis present

## 2019-04-20 DIAGNOSIS — Z4682 Encounter for fitting and adjustment of non-vascular catheter: Secondary | ICD-10-CM | POA: Diagnosis not present

## 2019-04-20 DIAGNOSIS — I130 Hypertensive heart and chronic kidney disease with heart failure and stage 1 through stage 4 chronic kidney disease, or unspecified chronic kidney disease: Secondary | ICD-10-CM | POA: Diagnosis present

## 2019-04-20 DIAGNOSIS — F418 Other specified anxiety disorders: Secondary | ICD-10-CM | POA: Diagnosis present

## 2019-04-20 HISTORY — DX: Chronic diastolic (congestive) heart failure: I50.32

## 2019-04-20 HISTORY — DX: Other specified postprocedural states: Z98.890

## 2019-04-20 HISTORY — PX: MITRAL VALVE REPAIR: SHX2039

## 2019-04-20 HISTORY — PX: TEE WITHOUT CARDIOVERSION: SHX5443

## 2019-04-20 LAB — POCT I-STAT 4, (NA,K, GLUC, HGB,HCT)
Glucose, Bld: 92 mg/dL (ref 70–99)
Glucose, Bld: 117 mg/dL — ABNORMAL HIGH (ref 70–99)
Glucose, Bld: 159 mg/dL — ABNORMAL HIGH (ref 70–99)
Glucose, Bld: 138 mg/dL — ABNORMAL HIGH (ref 70–99)
Glucose, Bld: 145 mg/dL — ABNORMAL HIGH (ref 70–99)
Glucose, Bld: 132 mg/dL — ABNORMAL HIGH (ref 70–99)
Glucose, Bld: 125 mg/dL — ABNORMAL HIGH (ref 70–99)
HCT: 23 % — ABNORMAL LOW (ref 36.0–46.0)
HCT: 22 % — ABNORMAL LOW (ref 36.0–46.0)
HCT: 20 % — ABNORMAL LOW (ref 36.0–46.0)
HCT: 20 % — ABNORMAL LOW (ref 36.0–46.0)
HCT: 21 % — ABNORMAL LOW (ref 36.0–46.0)
HCT: 26 % — ABNORMAL LOW (ref 36.0–46.0)
HCT: 23 % — ABNORMAL LOW (ref 36.0–46.0)
Hemoglobin: 7.8 g/dL — ABNORMAL LOW (ref 12.0–15.0)
Hemoglobin: 7.5 g/dL — ABNORMAL LOW (ref 12.0–15.0)
Hemoglobin: 6.8 g/dL — CL (ref 12.0–15.0)
Hemoglobin: 6.8 g/dL — CL (ref 12.0–15.0)
Hemoglobin: 7.1 g/dL — ABNORMAL LOW (ref 12.0–15.0)
Hemoglobin: 8.8 g/dL — ABNORMAL LOW (ref 12.0–15.0)
Hemoglobin: 7.8 g/dL — ABNORMAL LOW (ref 12.0–15.0)
Potassium: 3.9 mmol/L (ref 3.5–5.1)
Potassium: 4.4 mmol/L (ref 3.5–5.1)
Potassium: 6.9 mmol/L (ref 3.5–5.1)
Potassium: 4.9 mmol/L (ref 3.5–5.1)
Potassium: 5.3 mmol/L — ABNORMAL HIGH (ref 3.5–5.1)
Potassium: 4.5 mmol/L (ref 3.5–5.1)
Potassium: 4.8 mmol/L (ref 3.5–5.1)
Sodium: 139 mmol/L (ref 135–145)
Sodium: 139 mmol/L (ref 135–145)
Sodium: 137 mmol/L (ref 135–145)
Sodium: 134 mmol/L — ABNORMAL LOW (ref 135–145)
Sodium: 136 mmol/L (ref 135–145)
Sodium: 138 mmol/L (ref 135–145)
Sodium: 142 mmol/L (ref 135–145)

## 2019-04-20 LAB — GLUCOSE, CAPILLARY
Glucose-Capillary: 87 mg/dL (ref 70–99)
Glucose-Capillary: 102 mg/dL — ABNORMAL HIGH (ref 70–99)
Glucose-Capillary: 96 mg/dL (ref 70–99)
Glucose-Capillary: 108 mg/dL — ABNORMAL HIGH (ref 70–99)
Glucose-Capillary: 103 mg/dL — ABNORMAL HIGH (ref 70–99)
Glucose-Capillary: 126 mg/dL — ABNORMAL HIGH (ref 70–99)

## 2019-04-20 LAB — BASIC METABOLIC PANEL
Anion gap: 6 (ref 5–15)
BUN: 8 mg/dL (ref 8–23)
CO2: 21 mmol/L — ABNORMAL LOW (ref 22–32)
Calcium: 7.3 mg/dL — ABNORMAL LOW (ref 8.9–10.3)
Chloride: 114 mmol/L — ABNORMAL HIGH (ref 98–111)
Creatinine, Ser: 1.2 mg/dL — ABNORMAL HIGH (ref 0.44–1.00)
GFR calc Af Amer: 53 mL/min — ABNORMAL LOW (ref >60)
GFR calc non Af Amer: 45 mL/min — ABNORMAL LOW (ref >60)
Glucose, Bld: 127 mg/dL — ABNORMAL HIGH (ref 70–99)
Potassium: 5 mmol/L (ref 3.5–5.1)
Sodium: 141 mmol/L (ref 135–145)

## 2019-04-20 LAB — CBC
HCT: 32.6 % — ABNORMAL LOW (ref 36.0–46.0)
HCT: 26.6 % — ABNORMAL LOW (ref 36.0–46.0)
Hemoglobin: 10.6 g/dL — ABNORMAL LOW (ref 12.0–15.0)
Hemoglobin: 8.7 g/dL — ABNORMAL LOW (ref 12.0–15.0)
MCH: 30 pg (ref 26.0–34.0)
MCH: 30.4 pg (ref 26.0–34.0)
MCHC: 32.5 g/dL (ref 30.0–36.0)
MCHC: 32.7 g/dL (ref 30.0–36.0)
MCV: 92.4 fL (ref 80.0–100.0)
MCV: 93 fL (ref 80.0–100.0)
Platelets: 173 10*3/uL (ref 150–400)
Platelets: 131 10*3/uL — ABNORMAL LOW (ref 150–400)
RBC: 3.53 MIL/uL — ABNORMAL LOW (ref 3.87–5.11)
RBC: 2.86 MIL/uL — ABNORMAL LOW (ref 3.87–5.11)
RDW: 13.5 % (ref 11.5–15.5)
RDW: 13.6 % (ref 11.5–15.5)
WBC: 9.8 10*3/uL (ref 4.0–10.5)
WBC: 6.4 10*3/uL (ref 4.0–10.5)
nRBC: 0 % (ref 0.0–0.2)
nRBC: 0 % (ref 0.0–0.2)

## 2019-04-20 LAB — POCT I-STAT 7, (LYTES, BLD GAS, ICA,H+H)
Acid-base deficit: 4 mmol/L — ABNORMAL HIGH (ref 0.0–2.0)
Acid-base deficit: 2 mmol/L (ref 0.0–2.0)
Bicarbonate: 21.3 mmol/L (ref 20.0–28.0)
Bicarbonate: 23.3 mmol/L (ref 20.0–28.0)
Calcium, Ion: 0.93 mmol/L — ABNORMAL LOW (ref 1.15–1.40)
Calcium, Ion: 1.08 mmol/L — ABNORMAL LOW (ref 1.15–1.40)
HCT: 21 % — ABNORMAL LOW (ref 36.0–46.0)
HCT: 24 % — ABNORMAL LOW (ref 36.0–46.0)
Hemoglobin: 7.1 g/dL — ABNORMAL LOW (ref 12.0–15.0)
Hemoglobin: 8.2 g/dL — ABNORMAL LOW (ref 12.0–15.0)
O2 Saturation: 100 %
O2 Saturation: 100 %
Potassium: 5.1 mmol/L (ref 3.5–5.1)
Potassium: 4.6 mmol/L (ref 3.5–5.1)
Sodium: 138 mmol/L (ref 135–145)
Sodium: 138 mmol/L (ref 135–145)
TCO2: 23 mmol/L (ref 22–32)
TCO2: 25 mmol/L (ref 22–32)
pCO2 arterial: 39.4 mmHg (ref 32.0–48.0)
pCO2 arterial: 42.1 mmHg (ref 32.0–48.0)
pH, Arterial: 7.341 — ABNORMAL LOW (ref 7.350–7.450)
pH, Arterial: 7.35 (ref 7.350–7.450)
pO2, Arterial: 364 mmHg — ABNORMAL HIGH (ref 83.0–108.0)
pO2, Arterial: 353 mmHg — ABNORMAL HIGH (ref 83.0–108.0)

## 2019-04-20 LAB — HEMOGLOBIN AND HEMATOCRIT, BLOOD
HCT: 22.8 % — ABNORMAL LOW (ref 36.0–46.0)
Hemoglobin: 7.4 g/dL — ABNORMAL LOW (ref 12.0–15.0)

## 2019-04-20 LAB — PROTIME-INR
INR: 1.4 — ABNORMAL HIGH (ref 0.8–1.2)
Prothrombin Time: 16.8 seconds — ABNORMAL HIGH (ref 11.4–15.2)

## 2019-04-20 LAB — APTT: aPTT: 38 seconds — ABNORMAL HIGH (ref 24–36)

## 2019-04-20 LAB — PLATELET COUNT: Platelets: 191 10*3/uL (ref 150–400)

## 2019-04-20 LAB — PREPARE RBC (CROSSMATCH)

## 2019-04-20 LAB — MAGNESIUM: Magnesium: 4 mg/dL — ABNORMAL HIGH (ref 1.7–2.4)

## 2019-04-20 SURGERY — REPAIR, MITRAL VALVE, MINIMALLY INVASIVE
Anesthesia: General | Site: Chest | Laterality: Right

## 2019-04-20 MED ORDER — INSULIN REGULAR BOLUS VIA INFUSION
0.0000 [IU] | Freq: Three times a day (TID) | INTRAVENOUS | Status: DC
  Filled 2019-04-20: qty 10

## 2019-04-20 MED ORDER — ASPIRIN 81 MG PO CHEW: 324.0000 mg | CHEWABLE_TABLET | Freq: Every day | ORAL | Status: DC

## 2019-04-20 MED ORDER — PROPOFOL 10 MG/ML IV BOLUS
INTRAVENOUS | Status: DC | PRN
  Administered 2019-04-20: 100 mg via INTRAVENOUS

## 2019-04-20 MED ORDER — PROTAMINE SULFATE 10 MG/ML IV SOLN
INTRAVENOUS | Status: DC | PRN
  Administered 2019-04-20: 50 mg via INTRAVENOUS
  Administered 2019-04-20: 20 mg via INTRAVENOUS
  Administered 2019-04-20 (×2): 50 mg via INTRAVENOUS
  Administered 2019-04-20: 40 mg via INTRAVENOUS

## 2019-04-20 MED ORDER — NITROGLYCERIN IN D5W 200-5 MCG/ML-% IV SOLN
0.0000 ug/min | INTRAVENOUS | Status: DC
  Administered 2019-04-20: 0 ug/min via INTRAVENOUS

## 2019-04-20 MED ORDER — METOPROLOL TARTRATE 5 MG/5ML IV SOLN
2.5000 mg | INTRAVENOUS | Status: DC | PRN
  Administered 2019-04-20 – 2019-04-22 (×2): 2.5 mg via INTRAVENOUS
  Administered 2019-04-23: 04:00:00 5 mg via INTRAVENOUS
  Administered 2019-04-23: 2.5 mg via INTRAVENOUS
  Filled 2019-04-20 (×3): qty 5

## 2019-04-20 MED ORDER — PHENYLEPHRINE HCL-NACL 20-0.9 MG/250ML-% IV SOLN
0.0000 ug/min | INTRAVENOUS | Status: DC
  Administered 2019-04-20: 0 ug/min via INTRAVENOUS

## 2019-04-20 MED ORDER — ROCURONIUM BROMIDE 10 MG/ML (PF) SYRINGE
PREFILLED_SYRINGE | INTRAVENOUS | Status: DC | PRN
  Administered 2019-04-20: 60 mg via INTRAVENOUS
  Administered 2019-04-20: 40 mg via INTRAVENOUS
  Administered 2019-04-20: 50 mg via INTRAVENOUS

## 2019-04-20 MED ORDER — BISACODYL 10 MG RE SUPP: 10.0000 mg | Freq: Every day | RECTAL | Status: DC

## 2019-04-20 MED ORDER — SODIUM CHLORIDE 0.9% FLUSH
3.0000 mL | Freq: Two times a day (BID) | INTRAVENOUS | Status: DC
  Administered 2019-04-21 – 2019-04-25 (×5): 3 mL via INTRAVENOUS

## 2019-04-20 MED ORDER — TRAMADOL HCL 50 MG PO TABS
50.0000 mg | ORAL_TABLET | ORAL | Status: DC | PRN
  Administered 2019-04-26: 100 mg via ORAL
  Filled 2019-04-20: qty 2

## 2019-04-20 MED ORDER — 0.9 % SODIUM CHLORIDE (POUR BTL) OPTIME
TOPICAL | Status: DC | PRN
  Administered 2019-04-20: 13:00:00 2000 mL

## 2019-04-20 MED ORDER — VANCOMYCIN HCL 1000 MG IV SOLR
INTRAVENOUS | Status: DC | PRN
  Administered 2019-04-20: 13:00:00 1000 mL

## 2019-04-20 MED ORDER — SODIUM CHLORIDE 0.9 % IV SOLN
INTRAVENOUS | Status: DC
  Administered 2019-04-20: 14:00:00 via INTRAVENOUS

## 2019-04-20 MED ORDER — MIDAZOLAM HCL 5 MG/5ML IJ SOLN
INTRAMUSCULAR | Status: DC | PRN
  Administered 2019-04-20 (×3): 1 mg via INTRAVENOUS
  Administered 2019-04-20: 2 mg via INTRAVENOUS

## 2019-04-20 MED ORDER — SODIUM CHLORIDE 0.9 % IV SOLN
1.5000 g | Freq: Two times a day (BID) | INTRAVENOUS | Status: AC
  Administered 2019-04-20 – 2019-04-22 (×4): 1.5 g via INTRAVENOUS
  Filled 2019-04-20 (×4): qty 1.5

## 2019-04-20 MED ORDER — ACETAMINOPHEN 650 MG RE SUPP
650.0000 mg | Freq: Once | RECTAL | Status: AC
  Administered 2019-04-20: 650 mg via RECTAL

## 2019-04-20 MED ORDER — IPRATROPIUM-ALBUTEROL 0.5-2.5 (3) MG/3ML IN SOLN
3.0000 mL | RESPIRATORY_TRACT | Status: DC
  Administered 2019-04-20 (×3): 3 mL via RESPIRATORY_TRACT
  Filled 2019-04-20 (×3): qty 3

## 2019-04-20 MED ORDER — CHLORHEXIDINE GLUCONATE 0.12 % MT SOLN
15.0000 mL | OROMUCOSAL | Status: AC
  Administered 2019-04-20: 15:00:00 15 mL via OROMUCOSAL

## 2019-04-20 MED ORDER — LACTATED RINGERS IV SOLN: 500.0000 mL | Freq: Once | INTRAVENOUS | Status: DC | PRN

## 2019-04-20 MED ORDER — DEXAMETHASONE SODIUM PHOSPHATE 10 MG/ML IJ SOLN
INTRAMUSCULAR | Status: DC | PRN
  Administered 2019-04-20: 5 mg via INTRAVENOUS

## 2019-04-20 MED ORDER — EPHEDRINE SULFATE 50 MG/ML IJ SOLN
INTRAMUSCULAR | Status: DC | PRN
  Administered 2019-04-20 (×4): 10 mg via INTRAVENOUS

## 2019-04-20 MED ORDER — INSULIN ASPART 100 UNIT/ML ~~LOC~~ SOLN
0.0000 [IU] | SUBCUTANEOUS | Status: DC
  Administered 2019-04-20 – 2019-04-21 (×3): 2 [IU] via SUBCUTANEOUS

## 2019-04-20 MED ORDER — CHLORHEXIDINE GLUCONATE CLOTH 2 % EX PADS
6.0000 | MEDICATED_PAD | Freq: Every day | CUTANEOUS | Status: DC
  Administered 2019-04-20 – 2019-04-26 (×3): 6 via TOPICAL

## 2019-04-20 MED ORDER — CHLORHEXIDINE GLUCONATE 4 % EX LIQD: 30.0000 mL | CUTANEOUS | Status: DC

## 2019-04-20 MED ORDER — ACETAMINOPHEN 160 MG/5ML PO SOLN: 650.0000 mg | Freq: Once | ORAL | Status: AC

## 2019-04-20 MED ORDER — VECURONIUM BROMIDE 10 MG IV SOLR: INTRAVENOUS | Status: DC | PRN

## 2019-04-20 MED ORDER — ONDANSETRON HCL 4 MG/2ML IJ SOLN
4.0000 mg | Freq: Four times a day (QID) | INTRAMUSCULAR | Status: DC | PRN
  Administered 2019-04-21 – 2019-04-22 (×2): 4 mg via INTRAVENOUS
  Filled 2019-04-20 (×2): qty 2

## 2019-04-20 MED ORDER — SODIUM CHLORIDE 0.45 % IV SOLN
INTRAVENOUS | Status: DC | PRN
  Administered 2019-04-20: 14:00:00 via INTRAVENOUS

## 2019-04-20 MED ORDER — ALBUTEROL SULFATE HFA 108 (90 BASE) MCG/ACT IN AERS
INHALATION_SPRAY | RESPIRATORY_TRACT | Status: DC | PRN
  Administered 2019-04-20: 6 via RESPIRATORY_TRACT

## 2019-04-20 MED ORDER — DOCUSATE SODIUM 100 MG PO CAPS
200.0000 mg | ORAL_CAPSULE | Freq: Every day | ORAL | Status: DC
  Administered 2019-04-21 – 2019-04-24 (×2): 200 mg via ORAL
  Filled 2019-04-20 (×6): qty 2

## 2019-04-20 MED ORDER — MORPHINE SULFATE (PF) 2 MG/ML IV SOLN
1.0000 mg | INTRAVENOUS | Status: DC | PRN
  Administered 2019-04-20: 2 mg via INTRAVENOUS
  Administered 2019-04-21: 1 mg via INTRAVENOUS
  Administered 2019-04-21: 2 mg via INTRAVENOUS
  Filled 2019-04-20 (×3): qty 1

## 2019-04-20 MED ORDER — MAGNESIUM SULFATE 4 GM/100ML IV SOLN
4.0000 g | Freq: Once | INTRAVENOUS | Status: AC
  Administered 2019-04-20: 4 g via INTRAVENOUS
  Filled 2019-04-20: qty 100

## 2019-04-20 MED ORDER — POTASSIUM CHLORIDE 10 MEQ/50ML IV SOLN: 10.0000 meq | INTRAVENOUS | Status: AC

## 2019-04-20 MED ORDER — PANTOPRAZOLE SODIUM 40 MG PO TBEC
40.0000 mg | DELAYED_RELEASE_TABLET | Freq: Every day | ORAL | Status: DC
  Administered 2019-04-22 – 2019-04-26 (×5): 40 mg via ORAL
  Filled 2019-04-20 (×5): qty 1

## 2019-04-20 MED ORDER — SODIUM CHLORIDE 0.9 % IV SOLN: 250.0000 mL | INTRAVENOUS | Status: DC

## 2019-04-20 MED ORDER — ORAL CARE MOUTH RINSE
15.0000 mL | OROMUCOSAL | Status: DC
  Administered 2019-04-20 – 2019-04-21 (×3): 15 mL via OROMUCOSAL

## 2019-04-20 MED ORDER — SODIUM CHLORIDE 0.9 % IV SOLN
INTRAVENOUS | Status: DC | PRN
  Administered 2019-04-20: 30 ug/min via INTRAVENOUS

## 2019-04-20 MED ORDER — SODIUM CHLORIDE 0.9% FLUSH: 3.0000 mL | INTRAVENOUS | Status: DC | PRN

## 2019-04-20 MED ORDER — BUPIVACAINE HCL (PF) 0.5 % IJ SOLN
INTRAMUSCULAR | Status: AC
  Filled 2019-04-20: qty 10

## 2019-04-20 MED ORDER — MIDAZOLAM HCL 2 MG/2ML IJ SOLN: 2.0000 mg | INTRAMUSCULAR | Status: DC | PRN

## 2019-04-20 MED ORDER — PHENYLEPHRINE HCL (PRESSORS) 10 MG/ML IV SOLN
INTRAVENOUS | Status: DC | PRN
  Administered 2019-04-20 (×7): 80 ug via INTRAVENOUS

## 2019-04-20 MED ORDER — SODIUM CHLORIDE 0.9 % IV SOLN
INTRAVENOUS | Status: DC | PRN
  Administered 2019-04-20 (×2): via INTRAVENOUS

## 2019-04-20 MED ORDER — CHLORHEXIDINE GLUCONATE 0.12% ORAL RINSE (MEDLINE KIT)
15.0000 mL | Freq: Two times a day (BID) | OROMUCOSAL | Status: DC
  Administered 2019-04-20: 15 mL via OROMUCOSAL

## 2019-04-20 MED ORDER — LACTATED RINGERS IV SOLN: INTRAVENOUS | Status: DC

## 2019-04-20 MED ORDER — VECURONIUM BROMIDE 10 MG IV SOLR
INTRAVENOUS | Status: DC | PRN
  Administered 2019-04-20 (×2): 5 mg via INTRAVENOUS

## 2019-04-20 MED ORDER — LACTATED RINGERS IV SOLN
INTRAVENOUS | Status: DC | PRN
  Administered 2019-04-20: 08:00:00 via INTRAVENOUS

## 2019-04-20 MED ORDER — INSULIN REGULAR(HUMAN) IN NACL 100-0.9 UT/100ML-% IV SOLN
INTRAVENOUS | Status: DC
  Administered 2019-04-20: 1.6 [IU]/h via INTRAVENOUS

## 2019-04-20 MED ORDER — BISACODYL 5 MG PO TBEC
10.0000 mg | DELAYED_RELEASE_TABLET | Freq: Every day | ORAL | Status: DC
  Administered 2019-04-21: 10 mg via ORAL
  Filled 2019-04-20 (×6): qty 2

## 2019-04-20 MED ORDER — OXYCODONE HCL 5 MG PO TABS
5.0000 mg | ORAL_TABLET | ORAL | Status: DC | PRN
  Administered 2019-04-21 (×3): 5 mg via ORAL
  Administered 2019-04-22: 10 mg via ORAL
  Administered 2019-04-22: 5 mg via ORAL
  Administered 2019-04-23 – 2019-04-25 (×5): 10 mg via ORAL
  Administered 2019-04-26: 5 mg via ORAL
  Filled 2019-04-20: qty 2
  Filled 2019-04-20 (×2): qty 1
  Filled 2019-04-20 (×2): qty 2
  Filled 2019-04-20: qty 1
  Filled 2019-04-20: qty 2
  Filled 2019-04-20 (×2): qty 1
  Filled 2019-04-20 (×2): qty 2

## 2019-04-20 MED ORDER — BUPIVACAINE 0.5 % ON-Q PUMP SINGLE CATH 400 ML
INJECTION | Status: AC | PRN
  Administered 2019-04-20: 400 mL

## 2019-04-20 MED ORDER — LACTATED RINGERS IV SOLN
INTRAVENOUS | Status: DC | PRN
  Administered 2019-04-20: 09:00:00 via INTRAVENOUS

## 2019-04-20 MED ORDER — PLASMA-LYTE 148 IV SOLN
INTRAVENOUS | Status: AC | PRN
  Administered 2019-04-20: 500 mL via INTRAVENOUS

## 2019-04-20 MED ORDER — ALBUMIN HUMAN 5 % IV SOLN
250.0000 mL | INTRAVENOUS | Status: AC | PRN
  Administered 2019-04-20 (×4): 12.5 g via INTRAVENOUS
  Filled 2019-04-20: qty 500

## 2019-04-20 MED ORDER — LIDOCAINE 2% (20 MG/ML) 5 ML SYRINGE
INTRAMUSCULAR | Status: DC | PRN
  Administered 2019-04-20: 60 mg via INTRAVENOUS

## 2019-04-20 MED ORDER — LACTATED RINGERS IV SOLN
INTRAVENOUS | Status: DC | PRN
  Administered 2019-04-20 (×3): via INTRAVENOUS

## 2019-04-20 MED ORDER — ACETAMINOPHEN 160 MG/5ML PO SOLN
1000.0000 mg | Freq: Four times a day (QID) | ORAL | Status: DC
  Administered 2019-04-21: 1000 mg
  Filled 2019-04-20: qty 40.6

## 2019-04-20 MED ORDER — ROCURONIUM BROMIDE 100 MG/10ML IV SOLN: INTRAVENOUS | Status: DC | PRN

## 2019-04-20 MED ORDER — BUPIVACAINE HCL (PF) 0.5 % IJ SOLN
INTRAMUSCULAR | Status: DC | PRN
  Administered 2019-04-20: 10 mL

## 2019-04-20 MED ORDER — ACETAMINOPHEN 500 MG PO TABS
1000.0000 mg | ORAL_TABLET | Freq: Four times a day (QID) | ORAL | Status: AC
  Administered 2019-04-21 – 2019-04-25 (×14): 1000 mg via ORAL
  Filled 2019-04-20 (×15): qty 2

## 2019-04-20 MED ORDER — ALBUMIN HUMAN 5 % IV SOLN
12.5000 g | INTRAVENOUS | Status: DC | PRN
  Administered 2019-04-20: 12.5 g via INTRAVENOUS

## 2019-04-20 MED ORDER — CHLORHEXIDINE GLUCONATE 0.12 % MT SOLN
15.0000 mL | Freq: Once | OROMUCOSAL | Status: AC
  Administered 2019-04-20: 15 mL via OROMUCOSAL
  Filled 2019-04-20: qty 15

## 2019-04-20 MED ORDER — FAMOTIDINE IN NACL 20-0.9 MG/50ML-% IV SOLN
20.0000 mg | Freq: Two times a day (BID) | INTRAVENOUS | Status: AC
  Administered 2019-04-20 (×2): 20 mg via INTRAVENOUS
  Filled 2019-04-20: qty 50

## 2019-04-20 MED ORDER — LABETALOL HCL 5 MG/ML IV SOLN
10.0000 mg | INTRAVENOUS | Status: DC | PRN
  Administered 2019-04-20 – 2019-04-24 (×11): 10 mg via INTRAVENOUS
  Filled 2019-04-20 (×11): qty 4

## 2019-04-20 MED ORDER — VANCOMYCIN HCL IN DEXTROSE 1-5 GM/200ML-% IV SOLN
1000.0000 mg | Freq: Once | INTRAVENOUS | Status: AC
  Administered 2019-04-20: 22:00:00 1000 mg via INTRAVENOUS
  Filled 2019-04-20: qty 200

## 2019-04-20 MED ORDER — DEXMEDETOMIDINE HCL IN NACL 200 MCG/50ML IV SOLN
0.0000 ug/kg/h | INTRAVENOUS | Status: DC
  Administered 2019-04-20: 0.7 ug/kg/h via INTRAVENOUS

## 2019-04-20 MED ORDER — SODIUM CHLORIDE 0.9% FLUSH: 10.0000 mL | INTRAVENOUS | Status: DC | PRN

## 2019-04-20 MED ORDER — BUPIVACAINE 0.5 % ON-Q PUMP SINGLE CATH 400 ML
400.0000 mL | INJECTION | Status: DC
  Filled 2019-04-20: qty 400

## 2019-04-20 MED ORDER — SODIUM CHLORIDE 0.9% FLUSH
10.0000 mL | Freq: Two times a day (BID) | INTRAVENOUS | Status: DC
  Administered 2019-04-21: 20 mL
  Administered 2019-04-22 – 2019-04-24 (×2): 10 mL

## 2019-04-20 MED ORDER — HEPARIN SODIUM (PORCINE) 1000 UNIT/ML IJ SOLN
INTRAMUSCULAR | Status: DC | PRN
  Administered 2019-04-20: 21000 [IU] via INTRAVENOUS

## 2019-04-20 MED ORDER — FENTANYL CITRATE (PF) 250 MCG/5ML IJ SOLN
INTRAMUSCULAR | Status: DC | PRN
  Administered 2019-04-20: 150 ug via INTRAVENOUS
  Administered 2019-04-20 (×2): 50 ug via INTRAVENOUS
  Administered 2019-04-20: 100 ug via INTRAVENOUS
  Administered 2019-04-20: 250 ug via INTRAVENOUS
  Administered 2019-04-20: 150 ug via INTRAVENOUS
  Administered 2019-04-20: 100 ug via INTRAVENOUS

## 2019-04-20 MED ORDER — ASPIRIN EC 325 MG PO TBEC: 325.0000 mg | DELAYED_RELEASE_TABLET | Freq: Every day | ORAL | Status: DC

## 2019-04-20 MED ORDER — ONDANSETRON HCL 4 MG/2ML IJ SOLN
INTRAMUSCULAR | Status: DC | PRN
  Administered 2019-04-20: 4 mg via INTRAVENOUS

## 2019-04-20 MED ORDER — METOPROLOL TARTRATE 12.5 MG HALF TABLET: 12.5000 mg | ORAL_TABLET | Freq: Once | ORAL | Status: DC

## 2019-04-20 SURGICAL SUPPLY — 103 items
ADAPTER CARDIO PERF ANTE/RETRO (ADAPTER) ×3 IMPLANT
ADH SKN CLS APL DERMABOND .7 (GAUZE/BANDAGES/DRESSINGS) ×2
ADPR PRFSN 84XANTGRD RTRGD (ADAPTER) ×2
BAG DECANTER FOR FLEXI CONT (MISCELLANEOUS) ×6 IMPLANT
BLADE CLIPPER SURG (BLADE) ×1 IMPLANT
BLADE SURG 11 STRL SS (BLADE) ×3 IMPLANT
BLOOD HAEMOCONCENTR 700 MIDI (MISCELLANEOUS) ×1 IMPLANT
CANISTER SUCT 3000ML PPV (MISCELLANEOUS) ×6 IMPLANT
CANNULA FEM VENOUS REMOTE 22FR (CANNULA) ×1 IMPLANT
CANNULA FEMORAL ART 14 SM (MISCELLANEOUS) ×3 IMPLANT
CANNULA GUNDRY RCSP 15FR (MISCELLANEOUS) ×3 IMPLANT
CANNULA OPTISITE PERFUSION 18F (CANNULA) ×1 IMPLANT
CANNULA SUMP PERICARDIAL (CANNULA) ×6 IMPLANT
CATH KIT ON-Q SILVERSOAK 5 (CATHETERS) IMPLANT
CATH KIT ON-Q SILVERSOAK 5IN (CATHETERS) ×3 IMPLANT
CELLS DAT CNTRL 66122 CELL SVR (MISCELLANEOUS) ×2 IMPLANT
CONN ST 1/4X3/8  BEN (MISCELLANEOUS) ×2
CONN ST 1/4X3/8 BEN (MISCELLANEOUS) ×4 IMPLANT
CONNECTOR 1/2X3/8X1/2 3 WAY (MISCELLANEOUS) ×1
CONNECTOR 1/2X3/8X1/2 3WAY (MISCELLANEOUS) ×2 IMPLANT
CONT SPEC 4OZ CLIKSEAL STRL BL (MISCELLANEOUS) ×3 IMPLANT
COVER BACK TABLE 24X17X13 BIG (DRAPES) ×3 IMPLANT
COVER WAND RF STERILE (DRAPES) ×3 IMPLANT
DERMABOND ADVANCED (GAUZE/BANDAGES/DRESSINGS) ×1
DERMABOND ADVANCED .7 DNX12 (GAUZE/BANDAGES/DRESSINGS) ×4 IMPLANT
DEVICE CLOSURE PERCLS PRGLD 6F (VASCULAR PRODUCTS) IMPLANT
DEVICE PMI PUNCTURE CLOSURE (MISCELLANEOUS) ×3 IMPLANT
DEVICE SUT CK QUICK LOAD MINI (Prosthesis & Implant Heart) ×1 IMPLANT
DEVICE TROCAR PUNCTURE CLOSURE (ENDOMECHANICALS) ×3 IMPLANT
DRAIN CHANNEL 28F RND 3/8 FF (WOUND CARE) ×6 IMPLANT
DRAPE BILATERAL SPLIT (DRAPES) ×3 IMPLANT
DRAPE C-ARM 42X72 X-RAY (DRAPES) ×3 IMPLANT
DRAPE CV SPLIT W-CLR ANES SCRN (DRAPES) ×3 IMPLANT
DRAPE INCISE IOBAN 66X45 STRL (DRAPES) ×9 IMPLANT
DRAPE SLUSH/WARMER DISC (DRAPES) ×3 IMPLANT
DRSG AQUACEL AG ADV 3.5X10 (GAUZE/BANDAGES/DRESSINGS) ×1 IMPLANT
DRSG COVADERM 4X8 (GAUZE/BANDAGES/DRESSINGS) ×3 IMPLANT
ELECT BLADE 6.5 EXT (BLADE) ×3 IMPLANT
ELECT REM PT RETURN 9FT ADLT (ELECTROSURGICAL) ×6
ELECTRODE REM PT RTRN 9FT ADLT (ELECTROSURGICAL) ×4 IMPLANT
FELT TEFLON 1X6 (MISCELLANEOUS) ×6 IMPLANT
FEMORAL VENOUS CANN RAP (CANNULA) IMPLANT
GAUZE SPONGE 4X4 12PLY STRL LF (GAUZE/BANDAGES/DRESSINGS) ×3 IMPLANT
GLOVE BIO SURGEON ST LM GN SZ9 (GLOVE) ×1 IMPLANT
GLOVE BIO SURGEON STRL SZ8 (GLOVE) ×1 IMPLANT
GLOVE BIO SURGEON STRL SZ8.5 (GLOVE) ×1 IMPLANT
GLOVE ORTHO TXT STRL SZ7.5 (GLOVE) ×9 IMPLANT
GOWN STRL REUS W/ TWL LRG LVL3 (GOWN DISPOSABLE) ×8 IMPLANT
GOWN STRL REUS W/TWL LRG LVL3 (GOWN DISPOSABLE) ×12
KIT BASIN OR (CUSTOM PROCEDURE TRAY) ×3 IMPLANT
KIT DILATOR VASC 18G NDL (KITS) ×4 IMPLANT
KIT DRAINAGE VACCUM ASSIST (KITS) ×1 IMPLANT
KIT SUCTION CATH 14FR (SUCTIONS) ×3 IMPLANT
KIT SUT CK MINI COMBO 4X17 (Prosthesis & Implant Heart) ×1 IMPLANT
KIT TURNOVER KIT B (KITS) ×3 IMPLANT
LEAD PACING MYOCARDI (MISCELLANEOUS) ×3 IMPLANT
LINE VENT (MISCELLANEOUS) ×1 IMPLANT
NDL AORTIC ROOT 14G 7F (CATHETERS) ×2 IMPLANT
NEEDLE AORTIC ROOT 14G 7F (CATHETERS) ×3 IMPLANT
NS IRRIG 1000ML POUR BTL (IV SOLUTION) ×15 IMPLANT
PACK E MIN INVASIVE VALVE (SUTURE) ×3 IMPLANT
PACK OPEN HEART (CUSTOM PROCEDURE TRAY) ×3 IMPLANT
PAD ARMBOARD 7.5X6 YLW CONV (MISCELLANEOUS) ×6 IMPLANT
PAD ELECT DEFIB RADIOL ZOLL (MISCELLANEOUS) ×3 IMPLANT
PERCLOSE PROGLIDE 6F (VASCULAR PRODUCTS) ×9
POSITIONER HEAD DONUT 9IN (MISCELLANEOUS) ×3 IMPLANT
RETRACTOR WND ALEXIS 18 MED (MISCELLANEOUS) ×2 IMPLANT
RING MITRAL MEMO 4D 30 (Prosthesis & Implant Heart) ×1 IMPLANT
RTRCTR WOUND ALEXIS 18CM MED (MISCELLANEOUS) ×3
SET CANNULATION TOURNIQUET (MISCELLANEOUS) ×3 IMPLANT
SET CARDIOPLEGIA MPS 5001102 (MISCELLANEOUS) ×1 IMPLANT
SET IRRIG TUBING LAPAROSCOPIC (IRRIGATION / IRRIGATOR) ×3 IMPLANT
SET MICROPUNCTURE 5F STIFF (MISCELLANEOUS) ×2 IMPLANT
SHEATH PINNACLE 8F 10CM (SHEATH) ×4 IMPLANT
SOLUTION ANTI FOG 6CC (MISCELLANEOUS) ×3 IMPLANT
SUT BONE WAX W31G (SUTURE) ×3 IMPLANT
SUT ETHIBOND (SUTURE) ×2 IMPLANT
SUT ETHIBOND 2 0 SH (SUTURE) ×1 IMPLANT
SUT ETHIBOND 2-0 RB-1 WHT (SUTURE) ×2 IMPLANT
SUT ETHIBOND X763 2 0 SH 1 (SUTURE) ×3 IMPLANT
SUT GORETEX CV 4 TH 22 36 (SUTURE) ×3 IMPLANT
SUT GORETEX CV-5THC-13 36IN (SUTURE) ×8 IMPLANT
SUT GORETEX CV4 TH-18 (SUTURE) ×6 IMPLANT
SUT PROLENE 3 0 SH1 36 (SUTURE) ×12 IMPLANT
SUT PROLENE 4 0 RB 1 (SUTURE) ×6
SUT PROLENE 4-0 RB1 .5 CRCL 36 (SUTURE) IMPLANT
SUT PTFE CHORD X 20MM (SUTURE) ×1 IMPLANT
SUT SILK  1 MH (SUTURE) ×1
SUT SILK 1 MH (SUTURE) IMPLANT
SYR 10ML LL (SYRINGE) ×3 IMPLANT
SYSTEM SAHARA CHEST DRAIN ATS (WOUND CARE) ×3 IMPLANT
TAPE CLOTH SOFT 2X10 (GAUZE/BANDAGES/DRESSINGS) ×1 IMPLANT
TAPE CLOTH SURG 4X10 WHT LF (GAUZE/BANDAGES/DRESSINGS) ×1 IMPLANT
TOWEL GREEN STERILE (TOWEL DISPOSABLE) ×3 IMPLANT
TOWEL GREEN STERILE FF (TOWEL DISPOSABLE) ×3 IMPLANT
TRAY FOLEY SLVR 16FR TEMP STAT (SET/KITS/TRAYS/PACK) ×3 IMPLANT
TROCAR XCEL BLADELESS 5X75MML (TROCAR) ×3 IMPLANT
TROCAR XCEL NON-BLD 11X100MML (ENDOMECHANICALS) ×6 IMPLANT
TUBE SUCT INTRACARD DLP 20F (MISCELLANEOUS) ×3 IMPLANT
TUNNELER SHEATH ON-Q 11GX8 DSP (PAIN MANAGEMENT) ×1 IMPLANT
UNDERPAD 30X30 (UNDERPADS AND DIAPERS) ×3 IMPLANT
WATER STERILE IRR 1000ML POUR (IV SOLUTION) ×6 IMPLANT
WIRE .035 3MM-J 145CM (WIRE) ×3 IMPLANT

## 2019-04-20 NOTE — Brief Op Note (Signed)
04/20/2019  1:49 PM  PATIENT:  Katie [name]  73 y.o. female  PRE-OPERATIVE DIAGNOSIS:  MR  POST-OPERATIVE DIAGNOSIS:  MR  PROCEDURE:  Procedure(s): MINIMALLY INVASIVE MITRAL VALVE REPAIR (MVR) USING MEMO 4D SIZE 30 (Right) TRANSESOPHAGEAL ECHOCARDIOGRAM (TEE) (N/A)  SURGEON:  Surgeon(s) and Role:    * Rexene Alberts, MD - Primary    * Wonda Olds, MD - Assisting  ANESTHESIA:   general  EBL:  600 mL   BLOOD ADMINISTERED:2 CC PRBC  DRAINS: 2 Chest Tube(s) in the right pleural space and pericardial sac   LOCAL MEDICATIONS USED:  BUPIVICAINE   SPECIMEN:  Source of Specimen:  portion of posterior leaflet  DISPOSITION OF SPECIMEN:  PATHOLOGY  COUNTS:  YES  TOURNIQUET:  * No tourniquets in log *  DICTATION: .Note written in EPIC  PLAN OF CARE: Admit to inpatient   PATIENT DISPOSITION:  ICU - intubated and hemodynamically stable.   Delay start of Pharmacological VTE agent (>24hrs) due to surgical blood loss or risk of bleeding: yes  Rexene Alberts, MD 04/20/2019 1:51 PM

## 2019-04-20 NOTE — Interval H&P Note (Signed)
History and Physical Interval Note:  04/20/2019 7:04 AM  Katie [name]  has presented today for surgery, with the diagnosis of MR.  The various methods of treatment have been discussed with the patient and family. After consideration of risks, benefits and other options for treatment, the patient has consented to  Procedure(s): MINIMALLY INVASIVE MITRAL VALVE REPAIR (MVR) (Right) TRANSESOPHAGEAL ECHOCARDIOGRAM (TEE) (N/A) as a surgical intervention.  The patient's history has been reviewed, patient examined, no change in status, stable for surgery.  I have reviewed the patient's chart and labs.  Questions were answered to the patient's satisfaction.     Rexene Alberts

## 2019-04-20 NOTE — Anesthesia Procedure Notes (Signed)
Arterial Line Insertion Start/End7/10/2018 7:10 AM, 04/20/2019 7:30 AM Performed by: Jearld Pies, CRNA, CRNA  Patient location: Pre-op. Preanesthetic checklist: patient identified, IV checked, site marked, risks and benefits discussed, surgical consent, monitors and equipment checked, pre-op evaluation, timeout performed and anesthesia consent Lidocaine 1% used for infiltration and patient sedated Right, radial was placed Catheter size: 20 G Hand hygiene performed , maximum sterile barriers used  and Seldinger technique used Allen's test indicative of satisfactory collateral circulation Attempts: 2 Procedure performed without using ultrasound guided technique. Following insertion, dressing applied and Biopatch. Post procedure assessment: normal  Patient tolerated the procedure well with no immediate complications.

## 2019-04-20 NOTE — Anesthesia Procedure Notes (Signed)
Procedure Name: Intubation Date/Time: 04/20/2019 2:11 PM Performed by: Jearld Pies, CRNA Pre-anesthesia Checklist: Patient identified, Emergency Drugs available, Suction available and Patient being monitored Patient Re-evaluated:Patient Re-evaluated prior to induction Oxygen Delivery Method: Circle System Utilized Preoxygenation: Pre-oxygenation with 100% oxygen Laryngoscope Size: Mac and 4 Grade View: Grade II Tube type: Oral Tube size: 7.5 mm Number of attempts: 1 Airway Equipment and Method: Stylet and Oral airway Placement Confirmation: ETT inserted through vocal cords under direct vision,  positive ETCO2 and breath sounds checked- equal and bilateral Secured at: 23 cm Tube secured with: Tape Dental Injury: Teeth and Oropharynx as per pre-operative assessment  Comments: Cook catheter utilized to exchange DLT with 7.5 ETT.
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
## 2019-04-20 NOTE — Progress Notes (Addendum)
  Echocardiogram Echocardiogram Transesophageal has been performed.  Katie [name] 04/20/2019, 9:20 AM

## 2019-04-20 NOTE — Anesthesia Procedure Notes (Signed)
Central Venous Catheter Insertion Performed by: Albertha Ghee, MD, anesthesiologist Start/End7/10/2018 7:58 AM, 04/20/2019 8:07 AM Patient location: Pre-op. Preanesthetic checklist: patient identified, IV checked, site marked, risks and benefits discussed, surgical consent, monitors and equipment checked, pre-op evaluation, timeout performed and anesthesia consent Position: Trendelenburg Lidocaine 1% used for infiltration and patient sedated Hand hygiene performed , maximum sterile barriers used  and Seldinger technique used Catheter size: 8.5 Fr Central line and PA cath was placed.Sheath introducer Swan type:thermodilation Procedure performed using ultrasound guided technique. Ultrasound Notes:anatomy identified, needle tip was noted to be adjacent to the nerve/plexus identified, no ultrasound evidence of intravascular and/or intraneural injection and image(s) printed for medical record Attempts: 1 Following insertion, line sutured, dressing applied and Biopatch. Post procedure assessment: blood return through all ports, free fluid flow and no air  Patient tolerated the procedure well with no immediate complications.

## 2019-04-20 NOTE — Transfer of Care (Signed)
Immediate Anesthesia Transfer of Care Note  Patient: Katie [name]  Procedure(s) Performed: MINIMALLY INVASIVE MITRAL VALVE REPAIR (MVR) USING MEMO 4D SIZE 30 (Right Chest) TRANSESOPHAGEAL ECHOCARDIOGRAM (TEE) (N/A )  Patient Location: ICU  Anesthesia Type:General  Level of Consciousness: Patient remains intubated per anesthesia plan  Airway & Oxygen Therapy: Patient remains intubated per anesthesia plan and Patient placed on Ventilator (see vital sign flow sheet for setting)  Post-op Assessment: Report given to RN and Post -op Vital signs reviewed and stable  Post vital signs: Reviewed and stable  Last Vitals:  Vitals Value Taken Time  BP 117/*76 ABP   Temp    Pulse 80 DDD paced   Resp 12   SpO2 100    Report to Erin RN in ICU; patient currently on precedex and insulin gtts, RT at bedside, placed on ventilator - 50% FiO2 SIMV 12/5. Breath sounds present bilaterally. VSS.  Last Pain:  Vitals:   04/20/19 0702  TempSrc:   PainSc: 0-No pain      Patients Stated Pain Goal: 3 (25/[id number]/63 [id number])  Complications: No apparent anesthesia complications

## 2019-04-20 NOTE — Progress Notes (Signed)
TCTS Evening Rounds Day of surgery s/p complex MVR via Rt Thorc Hemodynamically stable of nitrogly drip and having received 3 doses labetalol for incrsd MAP A-paced at 80 Minimal CT output CXR clear A/p: awake to extubate

## 2019-04-20 NOTE — Anesthesia Procedure Notes (Signed)
Procedure Name: Intubation Date/Time: 04/20/2019 8:53 AM Performed by: Jearld Pies, CRNA Pre-anesthesia Checklist: Patient identified, Emergency Drugs available, Suction available and Patient being monitored Patient Re-evaluated:Patient Re-evaluated prior to induction Oxygen Delivery Method: Circle System Utilized Preoxygenation: Pre-oxygenation with 100% oxygen Induction Type: IV induction Ventilation: Oral airway inserted - appropriate to patient size and Mask ventilation without difficulty Laryngoscope Size: Mac and 4 Grade View: Grade I Tube type: Oral Endobronchial tube: Left, EBT position confirmed by auscultation, EBT position confirmed by fiberoptic bronchoscope and Double lumen EBT and 37 Fr Number of attempts: 1 Airway Equipment and Method: Stylet and Oral airway Placement Confirmation: ETT inserted through vocal cords under direct vision,  positive ETCO2 and breath sounds checked- equal and bilateral Secured at: 29 cm Tube secured with: Tape Dental Injury: Teeth and Oropharynx as per pre-operative assessment

## 2019-04-21 ENCOUNTER — Encounter (HOSPITAL_COMMUNITY): Payer: Self-pay | Admitting: Thoracic Surgery (Cardiothoracic Vascular Surgery)

## 2019-04-21 ENCOUNTER — Inpatient Hospital Stay (HOSPITAL_COMMUNITY): Payer: Medicare Other

## 2019-04-21 LAB — POCT I-STAT 7, (LYTES, BLD GAS, ICA,H+H)
Acid-base deficit: 5 mmol/L — ABNORMAL HIGH (ref 0.0–2.0)
Acid-base deficit: 6 mmol/L — ABNORMAL HIGH (ref 0.0–2.0)
Acid-base deficit: 6 mmol/L — ABNORMAL HIGH (ref 0.0–2.0)
Acid-base deficit: 6 mmol/L — ABNORMAL HIGH (ref 0.0–2.0)
Acid-base deficit: 6 mmol/L — ABNORMAL HIGH (ref 0.0–2.0)
Bicarbonate: 19.1 mmol/L — ABNORMAL LOW (ref 20.0–28.0)
Bicarbonate: 19.4 mmol/L — ABNORMAL LOW (ref 20.0–28.0)
Bicarbonate: 20.5 mmol/L (ref 20.0–28.0)
Bicarbonate: 20.9 mmol/L (ref 20.0–28.0)
Bicarbonate: 22.3 mmol/L (ref 20.0–28.0)
Calcium, Ion: 1.13 mmol/L — ABNORMAL LOW (ref 1.15–1.40)
Calcium, Ion: 1.13 mmol/L — ABNORMAL LOW (ref 1.15–1.40)
Calcium, Ion: 1.14 mmol/L — ABNORMAL LOW (ref 1.15–1.40)
Calcium, Ion: 1.16 mmol/L (ref 1.15–1.40)
Calcium, Ion: 1.18 mmol/L (ref 1.15–1.40)
HCT: 27 % — ABNORMAL LOW (ref 36.0–46.0)
HCT: 27 % — ABNORMAL LOW (ref 36.0–46.0)
HCT: 27 % — ABNORMAL LOW (ref 36.0–46.0)
HCT: 27 % — ABNORMAL LOW (ref 36.0–46.0)
HCT: 29 % — ABNORMAL LOW (ref 36.0–46.0)
Hemoglobin: 9.2 g/dL — ABNORMAL LOW (ref 12.0–15.0)
Hemoglobin: 9.2 g/dL — ABNORMAL LOW (ref 12.0–15.0)
Hemoglobin: 9.2 g/dL — ABNORMAL LOW (ref 12.0–15.0)
Hemoglobin: 9.2 g/dL — ABNORMAL LOW (ref 12.0–15.0)
Hemoglobin: 9.9 g/dL — ABNORMAL LOW (ref 12.0–15.0)
O2 Saturation: 90 %
O2 Saturation: 93 %
O2 Saturation: 97 %
O2 Saturation: 99 %
O2 Saturation: 99 %
Patient temperature: 35
Patient temperature: 36.1
Patient temperature: 36.2
Patient temperature: 36.3
Patient temperature: 36.5
Potassium: 4.2 mmol/L (ref 3.5–5.1)
Potassium: 4.8 mmol/L (ref 3.5–5.1)
Potassium: 5 mmol/L (ref 3.5–5.1)
Potassium: 5 mmol/L (ref 3.5–5.1)
Potassium: 5.3 mmol/L — ABNORMAL HIGH (ref 3.5–5.1)
Sodium: 141 mmol/L (ref 135–145)
Sodium: 141 mmol/L (ref 135–145)
Sodium: 141 mmol/L (ref 135–145)
Sodium: 141 mmol/L (ref 135–145)
Sodium: 142 mmol/L (ref 135–145)
TCO2: 20 mmol/L — ABNORMAL LOW (ref 22–32)
TCO2: 20 mmol/L — ABNORMAL LOW (ref 22–32)
TCO2: 22 mmol/L (ref 22–32)
TCO2: 22 mmol/L (ref 22–32)
TCO2: 24 mmol/L (ref 22–32)
pCO2 arterial: 34.3 mmHg (ref 32.0–48.0)
pCO2 arterial: 36 mmHg (ref 32.0–48.0)
pCO2 arterial: 38.8 mmHg (ref 32.0–48.0)
pCO2 arterial: 39.8 mmHg (ref 32.0–48.0)
pCO2 arterial: 54.5 mmHg — ABNORMAL HIGH (ref 32.0–48.0)
pH, Arterial: 7.217 — ABNORMAL LOW (ref 7.350–7.450)
pH, Arterial: 7.32 — ABNORMAL LOW (ref 7.350–7.450)
pH, Arterial: 7.325 — ABNORMAL LOW (ref 7.350–7.450)
pH, Arterial: 7.337 — ABNORMAL LOW (ref 7.350–7.450)
pH, Arterial: 7.35 (ref 7.350–7.450)
pO2, Arterial: 122 mmHg — ABNORMAL HIGH (ref 83.0–108.0)
pO2, Arterial: 162 mmHg — ABNORMAL HIGH (ref 83.0–108.0)
pO2, Arterial: 57 mmHg — ABNORMAL LOW (ref 83.0–108.0)
pO2, Arterial: 78 mmHg — ABNORMAL LOW (ref 83.0–108.0)
pO2, Arterial: 86 mmHg (ref 83.0–108.0)

## 2019-04-21 LAB — CBC
HCT: 29.4 % — ABNORMAL LOW (ref 36.0–46.0)
HCT: 29.1 % — ABNORMAL LOW (ref 36.0–46.0)
Hemoglobin: 9.3 g/dL — ABNORMAL LOW (ref 12.0–15.0)
Hemoglobin: 9.4 g/dL — ABNORMAL LOW (ref 12.0–15.0)
MCH: 30.1 pg (ref 26.0–34.0)
MCH: 30.9 pg (ref 26.0–34.0)
MCHC: 31.6 g/dL (ref 30.0–36.0)
MCHC: 32.3 g/dL (ref 30.0–36.0)
MCV: 95.1 fL (ref 80.0–100.0)
MCV: 95.7 fL (ref 80.0–100.0)
Platelets: 166 10*3/uL (ref 150–400)
Platelets: 165 10*3/uL (ref 150–400)
RBC: 3.09 MIL/uL — ABNORMAL LOW (ref 3.87–5.11)
RBC: 3.04 MIL/uL — ABNORMAL LOW (ref 3.87–5.11)
RDW: 13.9 % (ref 11.5–15.5)
RDW: 14.2 % (ref 11.5–15.5)
WBC: 9.5 10*3/uL (ref 4.0–10.5)
WBC: 10.6 10*3/uL — ABNORMAL HIGH (ref 4.0–10.5)
nRBC: 0 % (ref 0.0–0.2)
nRBC: 0 % (ref 0.0–0.2)

## 2019-04-21 LAB — BASIC METABOLIC PANEL
Anion gap: 7 (ref 5–15)
Anion gap: 12 (ref 5–15)
BUN: 11 mg/dL (ref 8–23)
BUN: 12 mg/dL (ref 8–23)
CO2: 20 mmol/L — ABNORMAL LOW (ref 22–32)
CO2: 21 mmol/L — ABNORMAL LOW (ref 22–32)
Calcium: 7.7 mg/dL — ABNORMAL LOW (ref 8.9–10.3)
Calcium: 8.1 mg/dL — ABNORMAL LOW (ref 8.9–10.3)
Chloride: 113 mmol/L — ABNORMAL HIGH (ref 98–111)
Chloride: 105 mmol/L (ref 98–111)
Creatinine, Ser: 1.44 mg/dL — ABNORMAL HIGH (ref 0.44–1.00)
Creatinine, Ser: 1.64 mg/dL — ABNORMAL HIGH (ref 0.44–1.00)
GFR calc Af Amer: 42 mL/min — ABNORMAL LOW (ref >60)
GFR calc Af Amer: 36 mL/min — ABNORMAL LOW (ref >60)
GFR calc non Af Amer: 36 mL/min — ABNORMAL LOW (ref >60)
GFR calc non Af Amer: 31 mL/min — ABNORMAL LOW (ref >60)
Glucose, Bld: 170 mg/dL — ABNORMAL HIGH (ref 70–99)
Glucose, Bld: 151 mg/dL — ABNORMAL HIGH (ref 70–99)
Potassium: 5.3 mmol/L — ABNORMAL HIGH (ref 3.5–5.1)
Potassium: 4 mmol/L (ref 3.5–5.1)
Sodium: 140 mmol/L (ref 135–145)
Sodium: 138 mmol/L (ref 135–145)

## 2019-04-21 LAB — COOXEMETRY PANEL
Carboxyhemoglobin: 1.3 % (ref 0.5–1.5)
Methemoglobin: 1.4 % (ref 0.0–1.5)
O2 Saturation: 70.4 %
Total hemoglobin: 9.8 g/dL — ABNORMAL LOW (ref 12.0–16.0)

## 2019-04-21 LAB — POCT I-STAT, CHEM 8
BUN: 7 mg/dL — ABNORMAL LOW (ref 8–23)
Calcium, Ion: 1.1 mmol/L — ABNORMAL LOW (ref 1.15–1.40)
Chloride: 107 mmol/L (ref 98–111)
Creatinine, Ser: 1 mg/dL (ref 0.44–1.00)
Glucose, Bld: 104 mg/dL — ABNORMAL HIGH (ref 70–99)
HCT: 29 % — ABNORMAL LOW (ref 36.0–46.0)
Hemoglobin: 9.9 g/dL — ABNORMAL LOW (ref 12.0–15.0)
Potassium: 4.4 mmol/L (ref 3.5–5.1)
Sodium: 140 mmol/L (ref 135–145)
TCO2: 21 mmol/L — ABNORMAL LOW (ref 22–32)

## 2019-04-21 LAB — MAGNESIUM
Magnesium: 3.5 mg/dL — ABNORMAL HIGH (ref 1.7–2.4)
Magnesium: 3 mg/dL — ABNORMAL HIGH (ref 1.7–2.4)

## 2019-04-21 LAB — ECHO INTRAOPERATIVE TEE
Height: 61 in
Weight: 2224 oz

## 2019-04-21 LAB — GLUCOSE, CAPILLARY
Glucose-Capillary: 140 mg/dL — ABNORMAL HIGH (ref 70–99)
Glucose-Capillary: 157 mg/dL — ABNORMAL HIGH (ref 70–99)
Glucose-Capillary: 159 mg/dL — ABNORMAL HIGH (ref 70–99)

## 2019-04-21 LAB — PROTIME-INR
INR: 1.2 (ref 0.8–1.2)
Prothrombin Time: 15.2 seconds (ref 11.4–15.2)

## 2019-04-21 MED ORDER — WARFARIN - PHYSICIAN DOSING INPATIENT
Freq: Every day | Status: DC
  Administered 2019-04-21 – 2019-04-24 (×2)

## 2019-04-21 MED ORDER — AMIODARONE HCL 200 MG PO TABS
200.0000 mg | ORAL_TABLET | Freq: Two times a day (BID) | ORAL | Status: DC
  Administered 2019-04-21 (×2): 200 mg via ORAL
  Filled 2019-04-21 (×2): qty 1

## 2019-04-21 MED ORDER — COUMADIN BOOK
Freq: Once | Status: DC
  Filled 2019-04-21 (×2): qty 1

## 2019-04-21 MED ORDER — ENOXAPARIN SODIUM 30 MG/0.3ML ~~LOC~~ SOLN
30.0000 mg | Freq: Every day | SUBCUTANEOUS | Status: DC
  Administered 2019-04-22 – 2019-04-23 (×2): 30 mg via SUBCUTANEOUS
  Filled 2019-04-21 (×2): qty 0.3

## 2019-04-21 MED ORDER — FUROSEMIDE 10 MG/ML IJ SOLN
40.0000 mg | Freq: Three times a day (TID) | INTRAMUSCULAR | Status: AC
  Administered 2019-04-21 (×3): 40 mg via INTRAVENOUS
  Filled 2019-04-21 (×3): qty 4

## 2019-04-21 MED ORDER — ATORVASTATIN CALCIUM 40 MG PO TABS
40.0000 mg | ORAL_TABLET | Freq: Every day | ORAL | Status: DC
  Administered 2019-04-24 – 2019-04-25 (×2): 40 mg via ORAL
  Filled 2019-04-21 (×2): qty 1

## 2019-04-21 MED ORDER — ASPIRIN EC 325 MG PO TBEC
325.0000 mg | DELAYED_RELEASE_TABLET | Freq: Every day | ORAL | Status: DC
  Filled 2019-04-21: qty 1

## 2019-04-21 MED ORDER — METOPROLOL TARTRATE 12.5 MG HALF TABLET
12.5000 mg | ORAL_TABLET | Freq: Two times a day (BID) | ORAL | Status: DC
  Administered 2019-04-21 (×2): 12.5 mg via ORAL
  Filled 2019-04-21 (×2): qty 1

## 2019-04-21 MED ORDER — IPRATROPIUM-ALBUTEROL 0.5-2.5 (3) MG/3ML IN SOLN: 3.0000 mL | RESPIRATORY_TRACT | Status: DC | PRN

## 2019-04-21 MED ORDER — WARFARIN SODIUM 2.5 MG PO TABS
2.5000 mg | ORAL_TABLET | Freq: Every day | ORAL | Status: DC
  Administered 2019-04-21 – 2019-04-25 (×5): 2.5 mg via ORAL
  Filled 2019-04-21 (×5): qty 1

## 2019-04-21 MED ORDER — METOCLOPRAMIDE HCL 5 MG/ML IJ SOLN
10.0000 mg | Freq: Four times a day (QID) | INTRAMUSCULAR | Status: AC
  Administered 2019-04-21 (×3): 10 mg via INTRAVENOUS
  Filled 2019-04-21 (×2): qty 2

## 2019-04-21 MED ORDER — ASPIRIN EC 81 MG PO TBEC
81.0000 mg | DELAYED_RELEASE_TABLET | Freq: Every day | ORAL | Status: DC
  Administered 2019-04-22 – 2019-04-26 (×5): 81 mg via ORAL
  Filled 2019-04-21 (×5): qty 1

## 2019-04-21 MED ORDER — INSULIN ASPART 100 UNIT/ML ~~LOC~~ SOLN
0.0000 [IU] | SUBCUTANEOUS | Status: DC
  Administered 2019-04-21: 2 [IU] via SUBCUTANEOUS

## 2019-04-21 NOTE — Progress Notes (Addendum)
TCTS DAILY ICU PROGRESS NOTE                   [address]            [place] [address]          [phone]   1 Day Post-Op Procedure(s) (LRB): MINIMALLY INVASIVE MITRAL VALVE REPAIR (MVR) USING MEMO 4D SIZE 30 (Right) TRANSESOPHAGEAL ECHOCARDIOGRAM (TEE) (N/A)  Total Length of Stay:  LOS: 1 day   Subjective: Extubated ~2:30 am, initially tachypneic and acidotic. Improved with BiPAP. Now on [place] O2 and appears comfortable.  Awake and alert with appropriate MS, mildly anxious. Pain controlled.   Objective: Vital signs in last 24 hours: Temp:  [94.8 F (34.9 C)-98.1 F (36.7 C)] 97.5 F (36.4 C) (07/02 0700) Pulse Rate:  [72-86] 75 (07/02 0700) Cardiac Rhythm: Atrial paced (07/02 0400) Resp:  [14-41] 26 (07/02 0700) BP: (86-168)/(66-100) 124/82 (07/02 0700) SpO2:  [91 %-100 %] 100 % (07/02 0700) Arterial Line BP: (89-196)/(55-118) 107/98 (07/02 0700) FiO2 (%):  [40 %-100 %] 40 % (07/02 0422) Weight:  [74 kg] 74 kg (07/02 0449)  Filed Weights   04/20/19 0638 04/21/19 0449  Weight: 63 kg 74 kg    Weight change: 11 kg   Hemodynamic parameters for last 24 hours: PAP: (25-66)/(13-36) 49/23 CO:  [2.4 L/min-3.4 L/min] 3.1 L/min CI:  [1.5 L/min/m2-2.1 L/min/m2] 1.9 L/min/m2  Intake/Output from previous day: 07/01 0701 - 07/02 0700 In: 7463.6 [I.V.:5142.3; Blood:400; NG/GT:75; IV Piggyback:1846.4] Out: 2215 [Urine:1015; Emesis/NG output:50; Blood:600; Chest Tube:550]  Intake/Output this shift: No intake/output data recorded.  Current Meds: Scheduled Meds: . acetaminophen  1,000 mg Oral Q6H  . amiodarone  200 mg Oral BID  . aspirin EC  325 mg Oral Daily  . [START ON 04/22/2019] aspirin EC  81 mg Oral Daily  . [START ON 04/24/2019] atorvastatin  40 mg Oral q1800  . bisacodyl  10 mg Oral Daily   Or  . bisacodyl  10 mg Rectal Daily  . chlorhexidine gluconate (MEDLINE KIT)  15 mL Mouth Rinse BID  . Chlorhexidine Gluconate Cloth  6 each Topical Daily  .  docusate sodium  200 mg Oral Daily  . [START ON 04/22/2019] enoxaparin (LOVENOX) injection  30 mg Subcutaneous QHS  . furosemide  40 mg Intravenous Q8H  . insulin aspart  0-24 Units Subcutaneous Q4H  . metoprolol tartrate  12.5 mg Oral BID  . [START ON 04/22/2019] pantoprazole  40 mg Oral Daily  . sodium chloride flush  10-40 mL Intracatheter Q12H  . sodium chloride flush  3 mL Intravenous Q12H  . warfarin  2.5 mg Oral q1800  . Warfarin - Physician Dosing Inpatient   Does not apply q1800   Continuous Infusions: . sodium chloride    . cefUROXime (ZINACEF)  IV Stopped (04/21/19 7622)  . lactated ringers    . lactated ringers    . nitroGLYCERIN 15 mcg/min (04/21/19 0657)   PRN Meds:.ipratropium-albuterol, labetalol, metoprolol tartrate, midazolam, morphine injection, ondansetron (ZOFRAN) IV, oxyCODONE, sodium chloride flush, sodium chloride flush, traMADol  General appearance: alert, cooperative and mild distress Neurologic: No gross motor or sensory deficits. Heart: regular rate and rhythm, no murmur. Lungs: Clear on the left, clear but diminished on the right Abdomen: Soft and non-tender, absent bowel sounds. Extremities: minimal peripheral edema, palpable DP and PT pulses.  Wound: Right chest incisions covered withsurgical dressings and are dry. Having some bloody drainage around the chest tube.   Lab Results:  CBC: Recent Labs    04/20/19 2010  04/21/19 0409 04/21/19 0529  WBC 6.4  --  9.5  --   HGB 8.7*   < > 9.3* 9.2*  HCT 26.6*   < > 29.4* 27.0*  PLT 131*  --  166  --    < > = values in this interval not displayed.   BMET:  Recent Labs    04/20/19 2010  04/21/19 0409 04/21/19 0529  NA 141   < > 140 141  K 5.0   < > 5.3* 5.0  CL 114*  --  113*  --   CO2 21*  --  20*  --   GLUCOSE 127*  --  170*  --   BUN 8  --  11  --   CREATININE 1.20*  --  1.44*  --   CALCIUM 7.3*  --  7.7*  --    < > = values in this interval not displayed.    CMET: Lab Results  Component  Value Date   WBC 9.5 04/21/2019   HGB 9.2 (L) 04/21/2019   HCT 27.0 (L) 04/21/2019   PLT 166 04/21/2019   GLUCOSE 170 (H) 04/21/2019   CHOL 223 (H) 09/29/2018   TRIG 271 (H) 09/29/2018   HDL 54 09/29/2018   LDLDIRECT 121.0 04/29/2017   LDLCALC 115 (H) 09/29/2018   ALT 24 04/04/2019   AST 19 04/04/2019   NA 141 04/21/2019   K 5.0 04/21/2019   CL 113 (H) 04/21/2019   CREATININE 1.44 (H) 04/21/2019   BUN 11 04/21/2019   CO2 20 (L) 04/21/2019   TSH 0.749 03/13/2019   INR 1.2 04/21/2019   HGBA1C 5.2 04/04/2019      PT/INR:  Recent Labs    04/21/19 0653  LABPROT 15.2  INR 1.2   Radiology: Dg Chest Port 1 View  Result Date: 04/20/2019 CLINICAL DATA:  Endotracheal tube placement EXAM: PORTABLE CHEST 1 VIEW COMPARISON:  April 18, 2019 FINDINGS: The endotracheal tube terminates just above the carina by approximately 1.9 cm. The Swan-Ganz catheter tip projects over the main pulmonary artery. There is an enteric tube extends below the left hemidiaphragm. The tip projects over the gastric body/fundus. The side hole projects over the GE junction/distal esophagus. There is a right-sided chest tube in place. There is a secondary chest tube overlying the right lower lung zone. This may represent a mediastinal drain. No right-sided pneumothorax. There are new airspace opacities in the bilateral mid lung zones. The heart size is stable. The patient is now status post mitral valve replacement. IMPRESSION: 1. Lines and tubes as above. The endotracheal tube terminates just above the carina. 2. There are new bilateral mid and upper lung zone airspace opacities which may represent postoperative atelectasis. A developing infiltrate is not excluded. 3. Mild volume overload. The patient is now status post mitral valve replacement. Electronically Signed   By: Constance Holster M.D.   On: 04/20/2019 14:52     Assessment/Plan: S/P Procedure(s) (LRB): MINIMALLY INVASIVE MITRAL VALVE REPAIR (MVR) USING MEMO  4D SIZE 30 (Right) TRANSESOPHAGEAL ECHOCARDIOGRAM (TEE) (N/A)   -POD1 mini MV repair for severe MR and chronic diastolic HF with pulmonary HTN. Stable VS and hemodynamics since surgery, currently weaning nitroglycerine.  Initiate anticoagulation with coumadin. Suspect she is at increased risk for developing atrial fibrillation due to h/o pulmonary HTN, HF, and mitral valve surgery. Will start amiodarone 254m po BID.  -Chronic renal insufficiency / post -op volume excess- Baseline creat.  1.2 and currently 1.44 and K+5.3. Urine output adequate. Will initiate diuresis with IV bolus Lasix. Recheck K+ later today.   -COPD, history of tobacco use- Required BiPAP briefly after extubation. Good sats now on 4L [place] O2.  Encourage pulmonary hygiene, continue Duonebs.   -Endo-history of hyperglycemia, pre-op A1C 5.2. Adequate control with SSI.  -History of anxiety and depression- not on andy Rx anxiolytics prior to admission and no indication for Tx now.   -GERD--Continue PPI  -DVT PPX--SCD's, mobilize, add enoxaparin tomorrow.     Antony Odea, PA-C [phone] 04/21/2019 7:50 AM   I have seen and examined the patient and agree with the assessment and plan as outlined.  Doing well POD1.  Maintaining NSR w/ stable hemodynamics and breathing comfortably w/ O2 sats 97-99%.  Weight up 10 kg.  Needs diuresis and pulm toilet.  Mobilize. D/C lines.  Restart metoprolol at reduced dose and start prophylactic amiodarone.  Start Coumadin.  Rexene Alberts, MD 04/21/2019

## 2019-04-21 NOTE — Procedures (Signed)
Extubation Procedure Note  Patient Details:   Name: Katie [name] DOB: 03-13-1946 MRN: [id number]   Airway Documentation:    Vent end date: 04/21/19 Vent end time: 0243   Evaluation  O2 sats: stable throughout Complications: No apparent complications Patient did tolerate procedure well. Bilateral Breath Sounds: Diminished   Yes able to speak name  Pt NIF -21, VC .5LPM, cuff leak present, pt able to speak name, pt able to cough sufficiently,no stridor noted at this time. Pt placed on 4 LPM [place] w/humidity. Pt tolerating well. RT will continue to monitor.

## 2019-04-21 NOTE — Progress Notes (Signed)
     [address]       [place] [address]             [phone]      Doing well.  Resting comfortably Creat trending up  Vitals:   04/21/19 1700 04/21/19 1800  BP: 114/74 127/86  Pulse: 66 71  Resp:    Temp:    SpO2: 99% 93%    Intake/Output Summary (Last 24 hours) at 04/21/2019 1927 Last data filed at 04/21/2019 1600 Gross per 24 hour  Intake 1652.62 ml  Output 2950 ml  Net -1297.38 ml   CBC    Component Value Date/Time   WBC 10.6 (H) 04/21/2019 1607   RBC 3.04 (L) 04/21/2019 1607   HGB 9.4 (L) 04/21/2019 1607   HCT 29.1 (L) 04/21/2019 1607   PLT 165 04/21/2019 1607   MCV 95.7 04/21/2019 1607   MCH 30.9 04/21/2019 1607   MCHC 32.3 04/21/2019 1607   RDW 14.2 04/21/2019 1607   LYMPHSABS 1.7 03/12/2019 2242   MONOABS 0.7 03/12/2019 2242   EOSABS 0.2 03/12/2019 2242   BASOSABS 0.1 03/12/2019 2242   BMP Latest Ref Rng & Units 04/21/2019 04/21/2019 04/21/2019  Glucose 70 - 99 mg/dL 151(H) - 170(H)  BUN 8 - 23 mg/dL 12 - 11  Creatinine 0.44 - 1.00 mg/dL 1.64(H) - 1.44(H)  Sodium 135 - 145 mmol/L 138 141 140  Potassium 3.5 - 5.1 mmol/L 4.0 5.0 5.3(H)  Chloride 98 - 111 mmol/L 105 - 113(H)  CO2 22 - 32 mmol/L 21(L) - 20(L)  Calcium 8.9 - 10.3 mg/dL 8.1(L) - 7.7(L)

## 2019-04-21 NOTE — Progress Notes (Addendum)
RN obtained ABG on pt with the following results. Pt NIF-21    VC  .5 Lmp. Best out of three attempts on both. RT will extubate.  Results for JANNIS, ATKINS (MRN [id number]) as of 04/21/2019 02:09  Ref. Range 04/21/2019 02:05  Sample type Unknown ARTERIAL  pH, Arterial Latest Ref Range: 7.350 - 7.450  7.350  pCO2 arterial Latest Ref Range: 32.0 - 48.0 mmHg 34.3  pO2, Arterial Latest Ref Range: 83.0 - 108.0 mmHg 86.0  TCO2 Latest Ref Range: 22 - 32 mmol/L 20 (L)  Acid-base deficit Latest Ref Range: 0.0 - 2.0 mmol/L 6.0 (H)  Bicarbonate Latest Ref Range: 20.0 - 28.0 mmol/L 19.1 (L)  O2 Saturation Latest Units: % 97.0  Patient temperature Unknown 36.2 C

## 2019-04-21 NOTE — Progress Notes (Signed)
RT assessed pt for readiness to wean to next phase of heart wean protocol. Pt able to hold head off of bed for 20+ sec, stick out tongue, wiggle toes and squeezed hands bilat upon request. Pt vent change to PSV/CPAP 10/5 and 40%. Pt respiratory status stable at this time. RT will reassess next phase of wean process to establish readiness for extubation, after RN pulls ABG at 0202. RT will continue to monitor.

## 2019-04-21 NOTE — Progress Notes (Signed)
Patient was extubated at 0243 and placed on 4L of oxygen [place]. Pt became very tachypneic and was barely pulling on her Incentive spirometer. ABG's were obtained and the results are: pH (7.21), PC02 (54.5), PO2 (78), Bicarb (22). MD was notified. Verbal orders were to start pt on Bipap. Will continue to monitor for any changes.

## 2019-04-21 NOTE — Anesthesia Postprocedure Evaluation (Signed)
Anesthesia Post Note  Patient: Katie [name]  Procedure(s) Performed: MINIMALLY INVASIVE MITRAL VALVE REPAIR (MVR) USING MEMO 4D SIZE 30 (Right Chest) TRANSESOPHAGEAL ECHOCARDIOGRAM (TEE) (N/A )     Patient location during evaluation: SICU Anesthesia Type: General Level of consciousness: sedated Pain management: pain level controlled Vital Signs Assessment: post-procedure vital signs reviewed and stable Respiratory status: patient remains intubated per anesthesia plan Cardiovascular status: stable Postop Assessment: no apparent nausea or vomiting Anesthetic complications: no    Last Vitals:  Vitals:   04/21/19 1133 04/21/19 1512  BP:    Pulse:    Resp:    Temp: (!) 36.4 C (!) 36.4 C  SpO2:      Last Pain:  Vitals:   04/21/19 1512  TempSrc: Oral  PainSc:                  [place] S

## 2019-04-21 NOTE — Progress Notes (Signed)
RT assessed pt for readiness to begin heart wean protocol. Pt able to hold head off of bed for 20+ sec, pt able to squeeze hands bilat, wiggle toes bilat, and stick out tongue upon request. Pt wean initiated with vent settings of SIMV/PRVC/PSV  VT 380, RR 4, FIO2 40%, PEEP 5, PS 10. Pt respiratory status stable at this time. RT will reassess for next phase of protocol at 0140. RT will continue to monitor.

## 2019-04-22 ENCOUNTER — Inpatient Hospital Stay (HOSPITAL_COMMUNITY): Payer: Medicare Other

## 2019-04-22 LAB — BASIC METABOLIC PANEL
Anion gap: 9 (ref 5–15)
BUN: 14 mg/dL (ref 8–23)
CO2: 26 mmol/L (ref 22–32)
Calcium: 8.7 mg/dL — ABNORMAL LOW (ref 8.9–10.3)
Chloride: 103 mmol/L (ref 98–111)
Creatinine, Ser: 1.6 mg/dL — ABNORMAL HIGH (ref 0.44–1.00)
GFR calc Af Amer: 37 mL/min — ABNORMAL LOW (ref >60)
GFR calc non Af Amer: 32 mL/min — ABNORMAL LOW (ref >60)
Glucose, Bld: 104 mg/dL — ABNORMAL HIGH (ref 70–99)
Potassium: 3.8 mmol/L (ref 3.5–5.1)
Sodium: 138 mmol/L (ref 135–145)

## 2019-04-22 LAB — CBC
HCT: 30.6 % — ABNORMAL LOW (ref 36.0–46.0)
Hemoglobin: 9.7 g/dL — ABNORMAL LOW (ref 12.0–15.0)
MCH: 29.9 pg (ref 26.0–34.0)
MCHC: 31.7 g/dL (ref 30.0–36.0)
MCV: 94.4 fL (ref 80.0–100.0)
Platelets: 173 10*3/uL (ref 150–400)
RBC: 3.24 MIL/uL — ABNORMAL LOW (ref 3.87–5.11)
RDW: 14.1 % (ref 11.5–15.5)
WBC: 10.6 10*3/uL — ABNORMAL HIGH (ref 4.0–10.5)
nRBC: 0 % (ref 0.0–0.2)

## 2019-04-22 LAB — COOXEMETRY PANEL
Carboxyhemoglobin: 1 % (ref 0.5–1.5)
Methemoglobin: 0.7 % (ref 0.0–1.5)
O2 Saturation: 50.1 %
Total hemoglobin: 9.9 g/dL — ABNORMAL LOW (ref 12.0–16.0)

## 2019-04-22 LAB — PROTIME-INR
INR: 1.3 — ABNORMAL HIGH (ref 0.8–1.2)
Prothrombin Time: 16.2 seconds — ABNORMAL HIGH (ref 11.4–15.2)

## 2019-04-22 LAB — GLUCOSE, CAPILLARY
Glucose-Capillary: 95 mg/dL (ref 70–99)
Glucose-Capillary: 98 mg/dL (ref 70–99)

## 2019-04-22 MED ORDER — FUROSEMIDE 10 MG/ML IJ SOLN
40.0000 mg | Freq: Three times a day (TID) | INTRAMUSCULAR | Status: AC
  Administered 2019-04-22 (×3): 40 mg via INTRAVENOUS
  Filled 2019-04-22 (×2): qty 4

## 2019-04-22 MED ORDER — FUROSEMIDE 40 MG PO TABS
40.0000 mg | ORAL_TABLET | Freq: Two times a day (BID) | ORAL | Status: DC
  Administered 2019-04-23 – 2019-04-24 (×3): 40 mg via ORAL
  Filled 2019-04-22 (×3): qty 1

## 2019-04-22 MED ORDER — MOVING RIGHT ALONG BOOK
Freq: Once | Status: DC
  Filled 2019-04-22 (×2): qty 1

## 2019-04-22 MED ORDER — AMIODARONE HCL 200 MG PO TABS
200.0000 mg | ORAL_TABLET | Freq: Two times a day (BID) | ORAL | Status: DC
  Administered 2019-04-22 (×2): 200 mg via ORAL
  Filled 2019-04-22 (×2): qty 1

## 2019-04-22 MED ORDER — POTASSIUM CHLORIDE CRYS ER 20 MEQ PO TBCR
20.0000 meq | EXTENDED_RELEASE_TABLET | Freq: Two times a day (BID) | ORAL | Status: DC
  Administered 2019-04-22 – 2019-04-26 (×9): 20 meq via ORAL
  Filled 2019-04-22 (×9): qty 1

## 2019-04-22 MED ORDER — METOPROLOL TARTRATE 12.5 MG HALF TABLET
12.5000 mg | ORAL_TABLET | Freq: Two times a day (BID) | ORAL | Status: DC
  Administered 2019-04-22 – 2019-04-24 (×5): 12.5 mg via ORAL
  Filled 2019-04-22 (×5): qty 1

## 2019-04-22 MED ORDER — IPRATROPIUM-ALBUTEROL 0.5-2.5 (3) MG/3ML IN SOLN
3.0000 mL | Freq: Four times a day (QID) | RESPIRATORY_TRACT | Status: DC | PRN
  Administered 2019-04-22: 3 mL via RESPIRATORY_TRACT
  Filled 2019-04-22: qty 3

## 2019-04-22 MED ORDER — MOMETASONE FURO-FORMOTEROL FUM 200-5 MCG/ACT IN AERO
2.0000 | INHALATION_SPRAY | Freq: Two times a day (BID) | RESPIRATORY_TRACT | Status: DC
  Administered 2019-04-22 – 2019-04-26 (×9): 2 via RESPIRATORY_TRACT
  Filled 2019-04-22: qty 8.8

## 2019-04-22 MED ORDER — POTASSIUM CHLORIDE 10 MEQ/50ML IV SOLN
10.0000 meq | INTRAVENOUS | Status: AC
  Administered 2019-04-22 (×3): 10 meq via INTRAVENOUS
  Filled 2019-04-22 (×2): qty 50

## 2019-04-22 NOTE — Progress Notes (Addendum)
2 Days Post-Op Procedure(s) (LRB): MINIMALLY INVASIVE MITRAL VALVE REPAIR (MVR) USING MEMO 4D SIZE 30 (Right) TRANSESOPHAGEAL ECHOCARDIOGRAM (TEE) (N/A) Subjective: Much more comfortable today, nausea resolved. Walked a full lab in the unit this AS.  No new concerns.  Objective: Vital signs in last 24 hours: Temp:  [97.5 F (36.4 C)-98.2 F (36.8 C)] 98.2 F (36.8 C) (07/03 0400) Pulse Rate:  [65-80] 68 (07/03 0700) Cardiac Rhythm: Normal sinus rhythm (07/03 0400) Resp:  [15-27] 21 (07/02 1500) BP: (85-166)/(55-99) 125/68 (07/03 0700) SpO2:  [90 %-99 %] 99 % (07/03 0700) Arterial Line BP: (103)/(86) 103/86 (07/02 0800) Weight:  [67.8 kg] 67.8 kg (07/03 0700)   Intake/Output from previous day: 07/02 0701 - 07/03 0700 In: 347.9 [P.O.:170; I.V.:77.9; IV Piggyback:100] Out: 6213 [Urine:3135; Chest Tube:500] Intake/Output this shift: No intake/output data recorded.  General appearance: alert, cooperative and no distress Neurologic: No gross motor or sensory deficits. Heart: regular rate and rhythm, no murmur. Lungs: breath sounds full and clear.  Abdomen: Soft and non-tender, few bowel sounds Extremities: minimal peripheral edema, palpable DP and PT pulses.  Wound: Right chest incisions covered withsurgical dressings and are dry. CT drainage tapering off Lab Results: Recent Labs    04/21/19 1607 04/22/19 0317  WBC 10.6* 10.6*  HGB 9.4* 9.7*  HCT 29.1* 30.6*  PLT 165 173   BMET:  Recent Labs    04/21/19 1607 04/22/19 0317  NA 138 138  K 4.0 3.8  CL 105 103  CO2 21* 26  GLUCOSE 151* 104*  BUN 12 14  CREATININE 1.64* 1.60*  CALCIUM 8.1* 8.7*    PT/INR:  Recent Labs    04/22/19 0317  LABPROT 16.2*  INR 1.3*   ABG    Component Value Date/Time   PHART 7.325 (L) 04/21/2019 0529   HCO3 20.9 04/21/2019 0529   TCO2 22 04/21/2019 0529   ACIDBASEDEF 5.0 (H) 04/21/2019 0529   O2SAT 50.1 04/22/2019 0330   CBG (last 3)  Recent Labs    04/21/19 2053  04/22/19 0032 04/22/19 0326  GLUCAP 159* 98 95    Assessment/Plan: S/P Procedure(s) (LRB): MINIMALLY INVASIVE MITRAL VALVE REPAIR (MVR) USING MEMO 4D SIZE 30 (Right) TRANSESOPHAGEAL ECHOCARDIOGRAM (TEE) (N/A)  -POD2 mini MV repair for severe MR and chronic diastolic HF with pulmonary HTN. Stable VS , off NTG. Continue anticoagulation with coumadin.  Continue  amiodarone 200mg  po BID for high likelihood that she will develop atrial fibrillation.  -Chronic renal insufficiency / post -op volume excess- Baseline creat. 1.2 and currently 1.60 and K+3.8 today. Urine output adequate. Continue Lasix 40mg  IVq8h today and convert to PO tomorrow. Monitor renal function.   -COPD, history of tobacco use- Required BiPAP briefly after extubation. Good sats now on 3L[place].   Encourage pulmonary hygiene, continue Duonebs.   -Endo-history of hyperglycemia, pre-op A1C 5.2. Adequate control with SSI.  -History of anxiety and depression- not on andy Rx anxiolytics prior to admission and no indication for Tx now.   -GERD--Continue PPI  -DVT PPX--SCD's, mobilize, add enoxaparin today.    LOS: 2 days    Antony Odea, PA-C [phone] 04/22/2019  I have seen and examined the patient and agree with the assessment and plan as outlined.  Doing very well POD2.  Mobilize.  Diuresis.  Leave chest tubes in until output decreases further.  Consider resuming ARB in 1-2 days if renal function improves and BP remains elevated.  Continue propylactic amiodarone.  Transfer step down.  Rexene Alberts, MD 04/22/2019 8:52  AM    

## 2019-04-22 NOTE — Discharge Instructions (Signed)
Discharge Instructions:  1. You may shower, please wash incisions daily with soap and water and keep dry.  If you wish to cover wounds with dressing you may do so but please keep clean and change daily.  No tub baths or swimming until incisions have completely healed.  If your incisions become red or develop any drainage please call our office at [phone]  2. No Driving until cleared by Dr. Guy Sandifer office and you are no longer using narcotic pain medications  3. Monitor your weight daily.. Please use the same scale and weigh at same time... If you gain 5-10 lbs in 48 hours with associated lower extremity swelling, please contact our office at [phone]  4. Fever of 101.5 for at least 24 hours with no source, please contact our office at [phone]  5. Activity- up as tolerated, please walk at least 3 times per day.  Avoid strenuous activity, no lifting, pushing, or pulling with your arms over 8-10 lbs for a minimum of 6 weeks  6. If any questions or concerns arise, please do not hesitate to contact our office at [phone] .    Information on my medicine - Coumadin   (Warfarin)  Why was Coumadin prescribed for you? Coumadin was prescribed for you because you have a blood clot or a medical condition that can cause an increased risk of forming blood clots. Blood clots can cause serious health problems by blocking the flow of blood to the heart, lung, or brain. Coumadin can prevent harmful blood clots from forming. As a reminder your indication for Coumadin is:   Stroke Prevention Because Of Atrial Fibrillation  What test will check on my response to Coumadin? While on Coumadin (warfarin) you will need to have an INR test regularly to ensure that your dose is keeping you in the desired range. The INR (international normalized ratio) number is calculated from the result of the laboratory test called prothrombin time (PT).  If an INR APPOINTMENT HAS NOT ALREADY BEEN MADE FOR YOU  please schedule an appointment to have this lab work done by your health care provider within 7 days. Your INR goal is usually a number between:  2 to 3 or your provider may give you a more narrow range like 2-2.5.  Ask your health care provider during an office visit what your goal INR is.  What  do you need to  know  About  COUMADIN? Take Coumadin (warfarin) exactly as prescribed by your healthcare provider about the same time each day.  DO NOT stop taking without talking to the doctor who prescribed the medication.  Stopping without other blood clot prevention medication to take the place of Coumadin may increase your risk of developing a new clot or stroke.  Get refills before you run out.  What do you do if you miss a dose? If you miss a dose, take it as soon as you remember on the same day then continue your regularly scheduled regimen the next day.  Do not take two doses of Coumadin at the same time.  Important Safety Information A possible side effect of Coumadin (Warfarin) is an increased risk of bleeding. You should call your healthcare provider right away if you experience any of the following: ? Bleeding from an injury or your nose that does not stop. ? Unusual colored urine (red or dark brown) or unusual colored stools (red or black). ? Unusual bruising for unknown reasons. ? A serious fall or if you hit your head (  even if there is no bleeding).  Some foods or medicines interact with Coumadin (warfarin) and might alter your response to warfarin. To help avoid this: ? Eat a balanced diet, maintaining a consistent amount of Vitamin K. ? Notify your provider about major diet changes you plan to make. ? Avoid alcohol or limit your intake to 1 drink for women and 2 drinks for men per day. (1 drink is 5 oz. wine, 12 oz. beer, or 1.5 oz. liquor.)  Make sure that ANY health care provider who prescribes medication for you knows that you are taking Coumadin (warfarin).  Also make sure the  healthcare provider who is monitoring your Coumadin knows when you have started a new medication including herbals and non-prescription products.  Coumadin (Warfarin)  Major Drug Interactions  Increased Warfarin Effect Decreased Warfarin Effect  Alcohol (large quantities) Antibiotics (esp. Septra/Bactrim, Flagyl, Cipro) Amiodarone (Cordarone) Aspirin (ASA) Cimetidine (Tagamet) Megestrol (Megace) NSAIDs (ibuprofen, naproxen, etc.) Piroxicam (Feldene) Propafenone (Rythmol SR) Propranolol (Inderal) Isoniazid (INH) Posaconazole (Noxafil) Barbiturates (Phenobarbital) Carbamazepine (Tegretol) Chlordiazepoxide (Librium) Cholestyramine (Questran) Griseofulvin Oral Contraceptives Rifampin Sucralfate (Carafate) Vitamin K   Coumadin (Warfarin) Major Herbal Interactions  Increased Warfarin Effect Decreased Warfarin Effect  Garlic Ginseng Ginkgo biloba Coenzyme Q10 Green tea St. Johns wort    Coumadin (Warfarin) FOOD Interactions  Eat a consistent number of servings per week of foods HIGH in Vitamin K (1 serving =  cup)  Collards (cooked, or boiled & drained) Kale (cooked, or boiled & drained) Mustard greens (cooked, or boiled & drained) Parsley *serving size only =  cup Spinach (cooked, or boiled & drained) Swiss chard (cooked, or boiled & drained) Turnip greens (cooked, or boiled & drained)  Eat a consistent number of servings per week of foods MEDIUM-HIGH in Vitamin K (1 serving = 1 cup)  Asparagus (cooked, or boiled & drained) Broccoli (cooked, boiled & drained, or raw & chopped) Brussel sprouts (cooked, or boiled & drained) *serving size only =  cup Lettuce, raw (green leaf, endive, romaine) Spinach, raw Turnip greens, raw & chopped   These websites have more information on Coumadin (warfarin):  FailFactory.se; VeganReport.com.au;

## 2019-04-23 LAB — CBC
HCT: 32.3 % — ABNORMAL LOW (ref 36.0–46.0)
Hemoglobin: 10.5 g/dL — ABNORMAL LOW (ref 12.0–15.0)
MCH: 30 pg (ref 26.0–34.0)
MCHC: 32.5 g/dL (ref 30.0–36.0)
MCV: 92.3 fL (ref 80.0–100.0)
Platelets: 207 10*3/uL (ref 150–400)
RBC: 3.5 MIL/uL — ABNORMAL LOW (ref 3.87–5.11)
RDW: 13.8 % (ref 11.5–15.5)
WBC: 11.3 10*3/uL — ABNORMAL HIGH (ref 4.0–10.5)
nRBC: 0 % (ref 0.0–0.2)

## 2019-04-23 LAB — BASIC METABOLIC PANEL WITH GFR
Anion gap: 13 (ref 5–15)
BUN: 15 mg/dL (ref 8–23)
CO2: 26 mmol/L (ref 22–32)
Calcium: 9.2 mg/dL (ref 8.9–10.3)
Chloride: 98 mmol/L (ref 98–111)
Creatinine, Ser: 1.54 mg/dL — ABNORMAL HIGH (ref 0.44–1.00)
GFR calc Af Amer: 39 mL/min — ABNORMAL LOW
GFR calc non Af Amer: 34 mL/min — ABNORMAL LOW
Glucose, Bld: 110 mg/dL — ABNORMAL HIGH (ref 70–99)
Potassium: 4.2 mmol/L (ref 3.5–5.1)
Sodium: 137 mmol/L (ref 135–145)

## 2019-04-23 LAB — PROTIME-INR
INR: 1.5 — ABNORMAL HIGH (ref 0.8–1.2)
Prothrombin Time: 17.6 s — ABNORMAL HIGH (ref 11.4–15.2)

## 2019-04-23 LAB — MAGNESIUM: Magnesium: 2.2 mg/dL (ref 1.7–2.4)

## 2019-04-23 MED ORDER — AMIODARONE HCL IN DEXTROSE 360-4.14 MG/200ML-% IV SOLN
30.0000 mg/h | INTRAVENOUS | Status: DC
  Filled 2019-04-23: qty 200

## 2019-04-23 MED ORDER — AMIODARONE HCL IN DEXTROSE 360-4.14 MG/200ML-% IV SOLN
30.0000 mg/h | INTRAVENOUS | Status: DC
  Administered 2019-04-23: 30 mg/h via INTRAVENOUS
  Filled 2019-04-23 (×7): qty 200

## 2019-04-23 MED ORDER — AMIODARONE LOAD VIA INFUSION
150.0000 mg | Freq: Once | INTRAVENOUS | Status: AC
  Administered 2019-04-23: 08:00:00 150 mg via INTRAVENOUS
  Filled 2019-04-23: qty 83.34

## 2019-04-23 MED ORDER — AMIODARONE HCL IN DEXTROSE 360-4.14 MG/200ML-% IV SOLN
60.0000 mg/h | INTRAVENOUS | Status: AC
  Administered 2019-04-23: 60 mg/h via INTRAVENOUS
  Filled 2019-04-23 (×2): qty 200

## 2019-04-23 MED ORDER — AMIODARONE IV BOLUS ONLY 150 MG/100ML: 150.0000 mg | Freq: Once | INTRAVENOUS | Status: DC

## 2019-04-23 NOTE — Progress Notes (Signed)
Informed at shift change that amio protocol has been ordered, however, medication has not been received from pharmacy at the time of this note.  Will continue to monitor and begin amio when it arrives on the floor.

## 2019-04-23 NOTE — Progress Notes (Addendum)
[address]atie [name]       [place] [address]             [phone]      3 Days Post-Op Procedure(s) (LRB): MINIMALLY INVASIVE MITRAL VALVE REPAIR (MVR) USING MEMO 4D SIZE 30 (Right) TRANSESOPHAGEAL ECHOCARDIOGRAM (TEE) (N/A) Subjective: Feels okay this morning. Now on Amio gtt.   Objective: Vital signs in last 24 hours: Temp:  [98 F (36.7 C)-98.7 F (37.1 C)] 98.5 F (36.9 C) (07/04 0751) Pulse Rate:  [69-112] 112 (07/04 0751) Cardiac Rhythm: Atrial fibrillation (07/04 0606) Resp:  [17-28] 19 (07/04 0751) BP: (108-171)/(80-103) 109/94 (07/04 0751) SpO2:  [90 %-96 %] 94 % (07/04 0751)     Intake/Output from previous day: 07/03 0701 - 07/04 0700 In: 273.5 [IV Piggyback:273.5] Out: 3202 [Urine:3150; Stool:2; Chest Tube:50] Intake/Output this shift: No intake/output data recorded.  General appearance: alert, cooperative and no distress Heart: regular rate and rhythm, S1, S2 normal, no murmur, click, rub or gallop Lungs: clear to auscultation bilaterally Abdomen: soft, non-tender; bowel sounds normal; no masses,  no organomegaly Extremities: extremities normal, atraumatic, no cyanosis or edema Wound: clean and dry covered with sterile dressing  Lab Results: Recent Labs    04/22/19 0317 04/23/19 0159  WBC 10.6* 11.3*  HGB 9.7* 10.5*  HCT 30.6* 32.3*  PLT 173 207   BMET:  Recent Labs    04/22/19 0317 04/23/19 0159  NA 138 137  K 3.8 4.2  CL 103 98  CO2 26 26  GLUCOSE 104* 110*  BUN 14 15  CREATININE 1.60* 1.54*  CALCIUM 8.7* 9.2    PT/INR:  Recent Labs    04/23/19 0159  LABPROT 17.6*  INR 1.5*   ABG    Component Value Date/Time   PHART 7.325 (L) 04/21/2019 0529   HCO3 20.9 04/21/2019 0529   TCO2 22 04/21/2019 0529   ACIDBASEDEF 5.0 (H) 04/21/2019 0529   O2SAT 50.1 04/22/2019 0330   CBG (last 3)  Recent Labs    04/21/19 2053 04/22/19 0032 04/22/19 0326  GLUCAP 159* 98 95    Assessment/Plan: S/P Procedure(s) (LRB):  MINIMALLY INVASIVE MITRAL VALVE REPAIR (MVR) USING MEMO 4D SIZE 30 (Right) TRANSESOPHAGEAL ECHOCARDIOGRAM (TEE) (N/A)  -POD3 mini MV repair for severe MR and chronic diastolic HF with pulmonary HTN.   1. CV- Went into Afib this morning. Highest rate-140s. Continue Amio gtt. Now in the 70s NSR. Continue Metoprolol. Holding ACEI for now due to kidney function 2. Pulm-tolerating room air with good oxygen saturation. Continue to encourage IS.  3. Renal- chronic renal insufficiency/ post-op volume excess-baseline creatinine 1.20, today 1.54. On oral lasix 40mg  BID. Continue-good urine output. Remains above baseline. Continue daily weights.  4. H and H-10.5/32.2, expected acute blood loss anemia, improving 5. Endo-blood glucose well controlled. Continue current regimen. 6. Continue low dose coumadin for valve thrombosis prophylaxis- INR 1.5  Plan:  Continue to watch rhythm. Ambulate around the unit. Continue incentive spirometer. Continue diuretic regimen for fluid overload. Hopefully home in 1-2 days once rhythm is stable.     LOS: 3 days    Elgie Collard 04/23/2019 Patient seen and examined, agree with above Went into atrial fib this AM, now back in SR on amiodarone Only 50 ml recorded from CT yesterday but I think it was much more than that looking at how much fluid is in pleuravac and how much was recorded previously. Will leave tubes in place (1550 ml total in canister)  Ryne Mctigue C. Danira Nylander, MD [facility] [phone]  

## 2019-04-23 NOTE — Progress Notes (Signed)
Notified by central telemetry and noted patient rhythm change, now in afib, rate controlled in 90s, Patient was helped to bedside commode when rhythm change occurred. Will continue to monitor for rapid ventricular response.

## 2019-04-24 LAB — BPAM RBC
Blood Product Expiration Date: 202007262359
Blood Product Expiration Date: 202007262359
Blood Product Expiration Date: 202007262359
Blood Product Expiration Date: 202007272359
Blood Product Expiration Date: 202007312359
Blood Product Expiration Date: 202007312359
ISSUE DATE / TIME: 202007010750
ISSUE DATE / TIME: 202007010750
ISSUE DATE / TIME: 202007011046
ISSUE DATE / TIME: 202007011046
ISSUE DATE / TIME: 202007011046
ISSUE DATE / TIME: 202007011046
Unit Type and Rh: 6200
Unit Type and Rh: 6200
Unit Type and Rh: 6200
Unit Type and Rh: 6200
Unit Type and Rh: 6200
Unit Type and Rh: 6200

## 2019-04-24 LAB — TYPE AND SCREEN
ABO/RH(D): A POS
Antibody Screen: NEGATIVE
Unit division: 0
Unit division: 0
Unit division: 0
Unit division: 0
Unit division: 0
Unit division: 0

## 2019-04-24 LAB — PROTIME-INR
INR: 1.8 — ABNORMAL HIGH (ref 0.8–1.2)
Prothrombin Time: 20.8 seconds — ABNORMAL HIGH (ref 11.4–15.2)

## 2019-04-24 MED ORDER — FUROSEMIDE 40 MG PO TABS
40.0000 mg | ORAL_TABLET | Freq: Every day | ORAL | Status: DC
  Administered 2019-04-25 – 2019-04-26 (×2): 40 mg via ORAL
  Filled 2019-04-24 (×2): qty 1

## 2019-04-24 MED ORDER — METOPROLOL TARTRATE 25 MG PO TABS
25.0000 mg | ORAL_TABLET | Freq: Two times a day (BID) | ORAL | Status: DC
  Administered 2019-04-24 – 2019-04-26 (×4): 25 mg via ORAL
  Filled 2019-04-24 (×4): qty 1

## 2019-04-24 MED ORDER — METOPROLOL TARTRATE 12.5 MG HALF TABLET
12.5000 mg | ORAL_TABLET | Freq: Two times a day (BID) | ORAL | Status: AC
  Administered 2019-04-24: 12.5 mg via ORAL
  Filled 2019-04-24: qty 1

## 2019-04-24 MED ORDER — AMIODARONE HCL 200 MG PO TABS
400.0000 mg | ORAL_TABLET | Freq: Two times a day (BID) | ORAL | Status: DC
  Administered 2019-04-24 – 2019-04-26 (×5): 400 mg via ORAL
  Filled 2019-04-24 (×5): qty 2

## 2019-04-24 MED ORDER — AMLODIPINE BESYLATE 5 MG PO TABS
5.0000 mg | ORAL_TABLET | Freq: Every day | ORAL | Status: DC
  Administered 2019-04-24 – 2019-04-26 (×3): 5 mg via ORAL
  Filled 2019-04-24 (×3): qty 1

## 2019-04-24 NOTE — Progress Notes (Addendum)
      [address]       [place] [address]             [phone]      4 Days Post-Op Procedure(s) (LRB): MINIMALLY INVASIVE MITRAL VALVE REPAIR (MVR) USING MEMO 4D SIZE 30 (Right) TRANSESOPHAGEAL ECHOCARDIOGRAM (TEE) (N/A) Subjective: Feels okay this morning. Worried about her BP.   Objective: Vital signs in last 24 hours: Temp:  [97.7 F (36.5 C)-98.7 F (37.1 C)] 98.3 F (36.8 C) (07/05 0455) Pulse Rate:  [65-77] 65 (07/05 0827) Cardiac Rhythm: Normal sinus rhythm (07/05 0700) Resp:  [16-29] 16 (07/05 0455) BP: (147-164)/(91-112) 164/103 (07/05 0455) SpO2:  [94 %-98 %] 96 % (07/05 0455) Weight:  [63 kg] 63 kg (07/05 0455)     Intake/Output from previous day: 07/04 0701 - 07/05 0700 In: 1276.4 [P.O.:960; I.V.:316.4] Out: 1185 [Urine:1000; Chest Tube:185] Intake/Output this shift: No intake/output data recorded.  General appearance: alert, cooperative and no distress Heart: regular rate and rhythm, S1, S2 normal, no murmur, click, rub or gallop Lungs: clear to auscultation bilaterally Abdomen: soft, non-tender; bowel sounds normal; no masses,  no organomegaly Extremities: extremities normal, atraumatic, no cyanosis or edema Wound: clean and dry  Lab Results: Recent Labs    04/22/19 0317 04/23/19 0159  WBC 10.6* 11.3*  HGB 9.7* 10.5*  HCT 30.6* 32.3*  PLT 173 207   BMET:  Recent Labs    04/22/19 0317 04/23/19 0159  NA 138 137  K 3.8 4.2  CL 103 98  CO2 26 26  GLUCOSE 104* 110*  BUN 14 15  CREATININE 1.60* 1.54*  CALCIUM 8.7* 9.2    PT/INR:  Recent Labs    04/24/19 0301  LABPROT 20.8*  INR 1.8*   ABG    Component Value Date/Time   PHART 7.325 (L) 04/21/2019 0529   HCO3 20.9 04/21/2019 0529   TCO2 22 04/21/2019 0529   ACIDBASEDEF 5.0 (H) 04/21/2019 0529   O2SAT 50.1 04/22/2019 0330   CBG (last 3)  Recent Labs    04/21/19 2053 04/22/19 0032 04/22/19 0326  GLUCAP 159* 98 95    Assessment/Plan: S/P Procedure(s)  (LRB): MINIMALLY INVASIVE MITRAL VALVE REPAIR (MVR) USING MEMO 4D SIZE 30 (Right) TRANSESOPHAGEAL ECHOCARDIOGRAM (TEE) (N/A)  1. CV-Afib yesterday now NSR in the 60s. BP 810F and 751W systolic. Continue Metoprolol. Holding Losartan due to kidney function. Will order BMP for the morning.  2. Pulm-tolerating room air with good oxygen saturation. Continue to encourage IS. Chest tubes put out  185cc/24 hours. 1635ml total in the canister.  3. Renal- chronic renal insufficiency/ post-op volume excess-baseline creatinine 1.20, today 1.54. On oral lasix 40mg  BID. 4. H and H-10.5/32.2, expected acute blood loss anemia, improving 5. Endo-blood glucose well controlled. Continue current regimen. 6. Continue low dose coumadin for valve thrombosis prophylaxis- INR 1.8  Plan:  Will discontinue chest tubes and will need to discontinue EPW due to her INR climbing. The patient is worried about her BP- may need to start PRN hydralazine if BP continues to climb. Decrease Lasix to 40mg  daily.    LOS: 4 days    Elgie Collard 04/24/2019 Patient seen and examined, agree with above  Remo Lipps C. Roxan Hockey, MD [facility] [phone]

## 2019-04-24 NOTE — Progress Notes (Signed)
      [address]       [place] [address]             [phone]       Paged this afternoon regarding the patient going in and out of a rate-controlled asymptomatic atrial fibrillation. Rate around 100bpm. The patient was switched to PO Amio 400mg  BID this morning. She was on 12.5mg  of lopressor but I have increased this to 25mg  BID. She will receive an additional 12.5mg  of lopressor now. She has PM doses of both Amio and Lopressor tonight. She is also on Coumadin and she is almost therapeutic-INR is 1.8.   She does not have a regular Cardiologist therefore we will need to set her up with good follow-up. We may even need an inpatient consult if atrial fibrillation persists.     Nursing was asked to call me if rate increases. We can always re-bolus her however currently her rate is controlled.   Medication changes: increase PO metoprolol to 25mg  BID.   Nicholes Rough, PA-C

## 2019-04-24 NOTE — Plan of Care (Signed)

## 2019-04-24 NOTE — Progress Notes (Signed)
Pacer wires removed without difficulty.  No bleeding at site.  Patient tolerated well.

## 2019-04-25 ENCOUNTER — Inpatient Hospital Stay (HOSPITAL_COMMUNITY): Payer: Medicare Other

## 2019-04-25 LAB — BASIC METABOLIC PANEL
Anion gap: 9 (ref 5–15)
Anion gap: 9 (ref 5–15)
BUN: 18 mg/dL (ref 8–23)
BUN: 18 mg/dL (ref 8–23)
CO2: 26 mmol/L (ref 22–32)
CO2: 26 mmol/L (ref 22–32)
Calcium: 9.4 mg/dL (ref 8.9–10.3)
Calcium: 9.4 mg/dL (ref 8.9–10.3)
Chloride: 102 mmol/L (ref 98–111)
Chloride: 100 mmol/L (ref 98–111)
Creatinine, Ser: 1.39 mg/dL — ABNORMAL HIGH (ref 0.44–1.00)
Creatinine, Ser: 1.35 mg/dL — ABNORMAL HIGH (ref 0.44–1.00)
GFR calc Af Amer: 44 mL/min — ABNORMAL LOW (ref >60)
GFR calc Af Amer: 46 mL/min — ABNORMAL LOW (ref >60)
GFR calc non Af Amer: 38 mL/min — ABNORMAL LOW (ref >60)
GFR calc non Af Amer: 39 mL/min — ABNORMAL LOW (ref >60)
Glucose, Bld: 80 mg/dL (ref 70–99)
Glucose, Bld: 93 mg/dL (ref 70–99)
Potassium: 4.4 mmol/L (ref 3.5–5.1)
Potassium: 4.4 mmol/L (ref 3.5–5.1)
Sodium: 137 mmol/L (ref 135–145)
Sodium: 135 mmol/L (ref 135–145)

## 2019-04-25 LAB — PROTIME-INR
INR: 1.9 — ABNORMAL HIGH (ref 0.8–1.2)
Prothrombin Time: 21.8 seconds — ABNORMAL HIGH (ref 11.4–15.2)

## 2019-04-25 LAB — MAGNESIUM: Magnesium: 2 mg/dL (ref 1.7–2.4)

## 2019-04-25 NOTE — Progress Notes (Signed)
Pt ambulated in hallway standby assist with walker twice this shift. Tolerated well.  Clyde Canterbury, RN

## 2019-04-25 NOTE — Care Management Important Message (Signed)
Important Message  Patient Details  Name: Katie [name] MRN: [id number] Date of Birth: Mar 23, 1946   Medicare Important Message Given:  Yes     Shelda Altes 04/25/2019, 1:07 PM

## 2019-04-25 NOTE — Progress Notes (Signed)
Bipap has PRN order.  No distress noted at this time.  Will Continue to monitor.

## 2019-04-25 NOTE — Progress Notes (Signed)
CARDIAC REHAB PHASE I   PRE:  Rate/Rhythm: 76 SR    BP: sitting 109/79    SaO2: 95 RA  MODE:  Ambulation: 420 ft   POST:  Rate/Rhythm: 85 SR    BP: sitting 106/77     SaO2: 93 RA  Pt moving well. Able to get up independently and walk with RW. Steady. C/o leg fatigue with distance. Return to bed as she sts she is still sleepy. Encouraged her to sit up later and walk x2 more times today. She practiced IS and flutter with production of sputum. Will f/u tomorrow. She has RW at home. [phone]   [place], ACSM 04/25/2019 10:27 AM

## 2019-04-25 NOTE — Progress Notes (Addendum)
5 Days Post-Op Procedure(s) (LRB): MINIMALLY INVASIVE MITRAL VALVE REPAIR (MVR) USING MEMO 4D SIZE 30 (Right) TRANSESOPHAGEAL ECHOCARDIOGRAM (TEE) (N/A) Subjective: Says she feels good, she isdoing well with mobility and pais is well controlled.   Objective: Vital signs in last 24 hours: Temp:  [97.9 F (36.6 C)-98.6 F (37 C)] 98 F (36.7 C) (07/06 0359) Pulse Rate:  [43-95] 77 (07/06 0359) Cardiac Rhythm: Normal sinus rhythm (07/06 0700) Resp:  [16-29] 18 (07/06 0359) BP: (106-184)/(72-94) 137/87 (07/06 0359) SpO2:  [95 %-98 %] 95 % (07/06 0731) Weight:  [61.9 kg] 61.9 kg (07/06 0359)   Intake/Output from previous day: 07/05 0701 - 07/06 0700 In: 1192.8 [P.O.:960; I.V.:232.8] Out: 1501 [Urine:1350; Stool:1; Chest Tube:150] Intake/Output this shift: No intake/output data recorded.  General appearance: alert, cooperative and no distress Neurologic: intact Heart: regular rate and rhythm Lungs: Breath sounds are clear Extremities: No peripheral edema. Wound: The right chest incision is well approximated nd dry. The chest tube sites are covered with a dry dressing.   Lab Results: Recent Labs    04/23/19 0159  WBC 11.3*  HGB 10.5*  HCT 32.3*  PLT 207   BMET:  Recent Labs    04/23/19 0159 04/25/19 0353  NA 137 137  K 4.2 4.4  CL 98 102  CO2 26 26  GLUCOSE 110* 80  BUN 15 18  CREATININE 1.54* 1.39*  CALCIUM 9.2 9.4    PT/INR:  Recent Labs    04/25/19 0353  LABPROT 21.8*  INR 1.9*   ABG    Component Value Date/Time   PHART 7.325 (L) 04/21/2019 0529   HCO3 20.9 04/21/2019 0529   TCO2 22 04/21/2019 0529   ACIDBASEDEF 5.0 (H) 04/21/2019 0529   O2SAT 50.1 04/22/2019 0330   CBG (last 3)  No results for input(s): GLUCAP in the last 72 hours.  Assessment/Plan: S/P Procedure(s) (LRB): MINIMALLY INVASIVE MITRAL VALVE REPAIR (MVR) USING MEMO 4D SIZE 30 (Right) TRANSESOPHAGEAL ECHOCARDIOGRAM (TEE) (N/A)  -POD5 mini MV repair for severe MR and chronic  diastolic HF with pulmonary HTN. Anticoagulating with coumadin, INR 1.9. will continue dosing at 2.5mg /day.  -Post-op atrial fibrillation, back in SR.Marland Kitchen  Continue  amiodarone 400mg  po BID   -Chronic renal insufficiency / post -op volume excess- Baseline creat. 1.2 and currently 1.39 and K+4.4 today. Urine output adequate. Continue Lasix 40mg  daily.  Monitor renal function.   -Hypertension-MAP~100, amlodipine added yesterday. If creat continues to trend down will consider resuming Cozaar at discharge   -COPD, history of tobacco use- Required BiPAP briefly after extubation. Good sats now RA.  Encourage pulmonary hygiene, continue Duonebs.   -History of anxiety and depression- not on andy Rx anxiolytics prior to admission and no indication for Tx now.   -GERD--Continue PPI  -DVT PPX--continue enoxaparin   LOS: 5 days    Antony Odea, PA-C [phone] 04/25/2019 Patient seen and examined, agree with above Likely home in AM  [place] C. Roxan Hockey, MD [facility] [phone]

## 2019-04-26 DIAGNOSIS — I4891 Unspecified atrial fibrillation: Secondary | ICD-10-CM | POA: Diagnosis not present

## 2019-04-26 LAB — CBC
HCT: 37 % (ref 36.0–46.0)
Hemoglobin: 12 g/dL (ref 12.0–15.0)
MCH: 29.9 pg (ref 26.0–34.0)
MCHC: 32.4 g/dL (ref 30.0–36.0)
MCV: 92.3 fL (ref 80.0–100.0)
Platelets: 241 10*3/uL (ref 150–400)
RBC: 4.01 MIL/uL (ref 3.87–5.11)
RDW: 13.4 % (ref 11.5–15.5)
WBC: 7.3 10*3/uL (ref 4.0–10.5)
nRBC: 0 % (ref 0.0–0.2)

## 2019-04-26 LAB — BASIC METABOLIC PANEL
Anion gap: 11 (ref 5–15)
BUN: 22 mg/dL (ref 8–23)
CO2: 25 mmol/L (ref 22–32)
Calcium: 9.7 mg/dL (ref 8.9–10.3)
Chloride: 99 mmol/L (ref 98–111)
Creatinine, Ser: 1.48 mg/dL — ABNORMAL HIGH (ref 0.44–1.00)
GFR calc Af Amer: 41 mL/min — ABNORMAL LOW (ref >60)
GFR calc non Af Amer: 35 mL/min — ABNORMAL LOW (ref >60)
Glucose, Bld: 91 mg/dL (ref 70–99)
Potassium: 5 mmol/L (ref 3.5–5.1)
Sodium: 135 mmol/L (ref 135–145)

## 2019-04-26 LAB — PROTIME-INR
INR: 2.3 — ABNORMAL HIGH (ref 0.8–1.2)
Prothrombin Time: 24.7 seconds — ABNORMAL HIGH (ref 11.4–15.2)

## 2019-04-26 MED ORDER — AMIODARONE HCL 200 MG PO TABS: 200.0000 mg | ORAL_TABLET | Freq: Two times a day (BID) | ORAL | 2 refills | Status: DC

## 2019-04-26 MED ORDER — OXYCODONE-ACETAMINOPHEN 5-325 MG PO TABS: 1.0000 | ORAL_TABLET | ORAL | 0 refills | Status: DC | PRN

## 2019-04-26 MED ORDER — COUMADIN BOOK: 1.0000 | Freq: Once | 0 refills | Status: AC

## 2019-04-26 MED ORDER — MOVING RIGHT ALONG BOOK: 1.0000 | Freq: Once | 0 refills | Status: AC

## 2019-04-26 MED ORDER — FUROSEMIDE 40 MG PO TABS: 40.0000 mg | ORAL_TABLET | Freq: Every day | ORAL | 1 refills | Status: DC

## 2019-04-26 MED ORDER — AMLODIPINE BESYLATE 5 MG PO TABS: 5.0000 mg | ORAL_TABLET | Freq: Every day | ORAL | 2 refills | Status: DC

## 2019-04-26 MED ORDER — WARFARIN SODIUM 2.5 MG PO TABS: 2.5000 mg | ORAL_TABLET | Freq: Every day | ORAL | 2 refills | Status: DC

## 2019-04-26 MED FILL — Electrolyte-R (PH 7.4) Solution: INTRAVENOUS | Qty: 4000 | Status: AC

## 2019-04-26 MED FILL — Heparin Sodium (Porcine) Inj 1000 Unit/ML: INTRAMUSCULAR | Qty: 20 | Status: AC

## 2019-04-26 MED FILL — Heparin Sodium (Porcine) Inj 1000 Unit/ML: INTRAMUSCULAR | Qty: 2500 | Status: AC

## 2019-04-26 MED FILL — Mannitol IV Soln 20%: INTRAVENOUS | Qty: 500 | Status: AC

## 2019-04-26 MED FILL — Heparin Sodium (Porcine) Inj 1000 Unit/ML: INTRAMUSCULAR | Qty: 30 | Status: AC

## 2019-04-26 MED FILL — Sodium Chloride IV Soln 0.9%: INTRAVENOUS | Qty: 3000 | Status: AC

## 2019-04-26 MED FILL — Lidocaine HCl Local Preservative Free (PF) Inj 2%: INTRAMUSCULAR | Qty: 15 | Status: AC

## 2019-04-26 MED FILL — Sodium Bicarbonate IV Soln 8.4%: INTRAVENOUS | Qty: 100 | Status: AC

## 2019-04-26 MED FILL — Potassium Chloride Inj 2 mEq/ML: INTRAVENOUS | Qty: 40 | Status: AC

## 2019-04-26 NOTE — Progress Notes (Addendum)
6 Days Post-Op Procedure(s) (LRB): MINIMALLY INVASIVE MITRAL VALVE REPAIR (MVR) USING MEMO 4D SIZE 30 (Right) TRANSESOPHAGEAL ECHOCARDIOGRAM (TEE) (N/A) Subjective: Feels good, no problems past 24 hours. Anxious to go home.   Objective: Vital signs in last 24 hours: Temp:  [97.7 F (36.5 C)-98.4 F (36.9 C)] 98.3 F (36.8 C) (07/07 0421) Pulse Rate:  [70-80] 80 (07/07 0421) Cardiac Rhythm: Atrial fibrillation (07/06 1934) Resp:  [15-23] 20 (07/07 0421) BP: (103-143)/(69-95) 115/76 (07/07 0421) SpO2:  [92 %-97 %] 94 % (07/07 0744) Weight:  [62 kg] 62 kg (07/07 0600)      Intake/Output from previous day: 07/06 0701 - 07/07 0700 In: 710 [P.O.:710] Out: 1000 [Urine:1000] Intake/Output this shift: No intake/output data recorded.  General appearance: alert, cooperative and no distress Neurologic: intact Heart: regular rate and rhythm Lungs: Breath sounds are clear Extremities: No peripheral edema. Wound: The right chest incision is well approximated and dry.  Lab Results: Recent Labs    04/26/19 0302  WBC 7.3  HGB 12.0  HCT 37.0  PLT 241   BMET:  Recent Labs    04/25/19 0805 04/26/19 0302  NA 135 135  K 4.4 5.0  CL 100 99  CO2 26 25  GLUCOSE 93 91  BUN 18 22  CREATININE 1.35* 1.48*  CALCIUM 9.4 9.7    PT/INR:  Recent Labs    04/26/19 0302  LABPROT 24.7*  INR 2.3*   ABG    Component Value Date/Time   PHART 7.325 (L) 04/21/2019 0529   HCO3 20.9 04/21/2019 0529   TCO2 22 04/21/2019 0529   ACIDBASEDEF 5.0 (H) 04/21/2019 0529   O2SAT 50.1 04/22/2019 0330   CBG (last 3)  No results for input(s): GLUCAP in the last 72 hours.  Assessment/Plan: S/P Procedure(s) (LRB): MINIMALLY INVASIVE MITRAL VALVE REPAIR (MVR) USING MEMO 4D SIZE 30 (Right) TRANSESOPHAGEAL ECHOCARDIOGRAM (TEE) (N/A)  -POD40mini MV repair for severe MR and chronic diastolic HF with pulmonary HTN. Anticoagulating with coumadin, INR 2.3.will continue dosing at 2.5mg /day.  Follow up  woith Coumadin Clinic arranged.  -Post-op atrial fibrillation, maintaining SR.   Continueamiodarone 200mg  po BID at discharge  -Chronic renal insufficiency / post -op volume excess- Baseline creat. 1.2 and currently 1.48and K+5.0 today. Urine output adequate.Continue Lasix 40mg  daily.    -Hypertension- control better with amlodipine + metoprolol. Will discharge on this regimen and continue holding losartan for now due to marginal renal function.   -COPD, history of tobacco use- Required BiPAP briefly after extubation. Good sats now RA.Encourage pulmonary hygiene post discharge.   -discharge to home today. Instructions given. To f/u with Coumadin Clinic later this week.    LOS: 6 days    Malon Kindle [phone] 04/26/2019 Patient seen and examined, agree with above Home today  Revonda Standard. Roxan Hockey, MD [facility] [phone]

## 2019-04-26 NOTE — Progress Notes (Signed)
04/26/2019 1130 Chest tube sutures removed.  Pt tolerated well.  Did note some drainage from proximal site-steri-strips applied and a few 2x2's.  Pt was given additional gauze to take home with her and was instructed to monitor site. [place]ney [place]

## 2019-04-26 NOTE — Progress Notes (Signed)
Discussed restrictions, ambulation at home, diet, smoking cessation, IS, and CRPII. Good reception. Requests her referral be sent to [facility] CES, ACSM 8:58 AM 04/26/2019

## 2019-04-26 NOTE — Progress Notes (Signed)
04/26/2019 11:52 AM Discharge AVS meds taken today and those due this evening reviewed.  Follow-up appointments and when to call md reviewed.  D/C IV and TELE.  Questions and concerns addressed.   D/C home per orders. Carney Corners

## 2019-04-29 ENCOUNTER — Ambulatory Visit (INDEPENDENT_AMBULATORY_CARE_PROVIDER_SITE_OTHER): Payer: Medicare Other | Admitting: *Deleted

## 2019-04-29 ENCOUNTER — Other Ambulatory Visit: Payer: Self-pay

## 2019-04-29 DIAGNOSIS — I340 Nonrheumatic mitral (valve) insufficiency: Secondary | ICD-10-CM | POA: Diagnosis not present

## 2019-04-29 DIAGNOSIS — I4891 Unspecified atrial fibrillation: Secondary | ICD-10-CM

## 2019-04-29 DIAGNOSIS — Z9889 Other specified postprocedural states: Secondary | ICD-10-CM | POA: Diagnosis not present

## 2019-04-29 DIAGNOSIS — Z5181 Encounter for therapeutic drug level monitoring: Secondary | ICD-10-CM | POA: Diagnosis not present

## 2019-04-29 LAB — POCT INR: INR: 3.7 — AB (ref 2.0–3.0)

## 2019-04-29 NOTE — Patient Instructions (Signed)
Description   Skip today's dose, then start taking 1 tablet daily except 1/2 tablet on Sundays and Wednesdays. Recheck in one week. Call us with any questions, concerns or medication changes # [phone].     A full discussion of the nature of anticoagulants has been carried out.  A benefit risk analysis has been presented to the patient, so that they understand the justification for choosing anticoagulation at this time. The need for frequent and regular monitoring, precise dosage adjustment and compliance is stressed.  Side effects of potential bleeding are discussed.  The patient should avoid any OTC items containing aspirin or ibuprofen, and should avoid great swings in general diet.  Avoid alcohol consumption.  Call if any signs of abnormal bleeding.

## 2019-04-30 ENCOUNTER — Telehealth: Payer: Self-pay | Admitting: Physician Assistant

## 2019-04-30 NOTE — Telephone Encounter (Signed)
Katie [name] is a 73 y.o. female who was DC this week after min invasive MV repair.  She was doing ok until today.  She has been coughing with white sputum production.  She denies fever, shortness of breath, pleuritic chest pain, chest pain with lying supine, myalgias, sore throat, dysgeusia.  She notes pain in her chest around her incision.  She denies redness, drainage.   PLAN: I suggested she use her nebulizer on a scheduled basis and use Robitussin as needed for cough. If she develops fever, shortness of breath, purulent sputum or hemoptysis, she should call her PCP. I asked her to call Dr. Guy Sandifer office as well given her incisional pain.  I suspect the pain is related to her cough but I felt it would be best to apprise him of her symptoms. Richardson Dopp, PA-C    04/30/2019 8:10 AM

## 2019-05-02 ENCOUNTER — Telehealth (HOSPITAL_COMMUNITY): Payer: Self-pay

## 2019-05-02 NOTE — Telephone Encounter (Signed)
Pt insurance is active and benefits verified through [facility]. Co-pay $20.00, DED $0.00/$0.00 met, out of pocket $4,500.00/$3,586.96 met, co-insurance 0%. No pre-authorization required. Passport, 05/02/2019 @ 4:19PM, LTY#[id number]  Patient will need to complete follow up appt. Once completed, patient will be contacted for scheduling upon review by the RN Navigator.

## 2019-05-04 ENCOUNTER — Telehealth: Payer: Self-pay | Admitting: Physician Assistant

## 2019-05-04 DIAGNOSIS — S92351D Displaced fracture of fifth metatarsal bone, right foot, subsequent encounter for fracture with routine healing: Secondary | ICD-10-CM | POA: Diagnosis not present

## 2019-05-04 NOTE — Telephone Encounter (Signed)
VM, reminding pt of her appt with Almyra Deforest on 05-05-19.

## 2019-05-05 ENCOUNTER — Ambulatory Visit (INDEPENDENT_AMBULATORY_CARE_PROVIDER_SITE_OTHER): Payer: Medicare Other | Admitting: Pharmacist

## 2019-05-05 ENCOUNTER — Ambulatory Visit (INDEPENDENT_AMBULATORY_CARE_PROVIDER_SITE_OTHER): Payer: Medicare Other | Admitting: Physician Assistant

## 2019-05-05 ENCOUNTER — Encounter: Payer: Self-pay | Admitting: Physician Assistant

## 2019-05-05 ENCOUNTER — Other Ambulatory Visit: Payer: Self-pay

## 2019-05-05 VITALS — BP 110/70 | HR 65 | Temp 95.0°F | Ht 61.0 in | Wt 138.8 lb

## 2019-05-05 DIAGNOSIS — N183 Chronic kidney disease, stage 3 unspecified: Secondary | ICD-10-CM

## 2019-05-05 DIAGNOSIS — I480 Paroxysmal atrial fibrillation: Secondary | ICD-10-CM

## 2019-05-05 DIAGNOSIS — Z905 Acquired absence of kidney: Secondary | ICD-10-CM

## 2019-05-05 DIAGNOSIS — R053 Chronic cough: Secondary | ICD-10-CM

## 2019-05-05 DIAGNOSIS — I5032 Chronic diastolic (congestive) heart failure: Secondary | ICD-10-CM | POA: Diagnosis not present

## 2019-05-05 DIAGNOSIS — E785 Hyperlipidemia, unspecified: Secondary | ICD-10-CM

## 2019-05-05 DIAGNOSIS — I4891 Unspecified atrial fibrillation: Secondary | ICD-10-CM

## 2019-05-05 DIAGNOSIS — I2510 Atherosclerotic heart disease of native coronary artery without angina pectoris: Secondary | ICD-10-CM | POA: Diagnosis not present

## 2019-05-05 DIAGNOSIS — I340 Nonrheumatic mitral (valve) insufficiency: Secondary | ICD-10-CM

## 2019-05-05 DIAGNOSIS — Z9889 Other specified postprocedural states: Secondary | ICD-10-CM | POA: Diagnosis not present

## 2019-05-05 DIAGNOSIS — I10 Essential (primary) hypertension: Secondary | ICD-10-CM

## 2019-05-05 DIAGNOSIS — R05 Cough: Secondary | ICD-10-CM

## 2019-05-05 LAB — POCT INR: INR: 2 (ref 2.0–3.0)

## 2019-05-05 NOTE — Patient Instructions (Signed)
Medication Instructions:   FINISH CURRENT AMIODARONE Your physician recommends that you continue on your current medications as directed. Please refer to the Current Medication list given to you today.  If you need a refill on your cardiac medications before your next appointment, please call your pharmacy.   Lab work: YOU WILL NEED TO HAVE LABS (BLOOD WORK) DRAWN TODAY:  BMET If you have labs (blood work) drawn today and your tests are completely normal, you will receive your results only by: Marland Kitchen MyChart Message (if you have MyChart) OR . A paper copy in the mail If you have any lab test that is abnormal or we need to change your treatment, we will call you to review the results.  Testing/Procedures: NONE ordered at this time of appointment   Follow-Up: At [facility], you and your health needs are our priority.  As part of our continuing mission to provide you with exceptional heart care, we have created designated Provider Care Teams.  These Care Teams include your primary Cardiologist (physician) and Advanced Practice Providers (APPs -  Physician Assistants and Nurse Practitioners) who all work together to provide you with the care you need, when you need it. You will need a follow up appointment in 2-3 months.  Please call our office 2 months in advance to schedule this appointment.  You may see Skeet Latch, MD or one of the following Advanced Practice Providers on your designated Care Team:   Kerin Ransom, PA-C Roby Lofts, [place] . Sande Rives, PA-C  Any Other Special Instructions Will Be Listed Below (If Applicable).

## 2019-05-05 NOTE — Progress Notes (Signed)
Cardiology Office Note    Date:  05/07/2019   ID:  Katie [name], DOB 01-11-1946, MRN [id number]  PCP:  Brunetta Jeans, PA-C  Cardiologist:  Dr. Oval Linsey  Chief Complaint  Patient presents with  . Follow-up    History of Present Illness:  Katie [name] is a 73 y.o. female with past medical history of severe MVP and severe MR, nonobstructive CAD, chronic diastolic CHF, carotid artery disease, HTN, HLD, COPD, renal cell carcinoma s/p right nephrectomy 2011, CKD stage III, and tobacco abuse.  Patient was admitted in May 2020 with lower extremity edema and shortness of breath.  She was found to have severe MR during the admission the TTE and TEE.  Left and right heart cath revealed 25% stenosis in proximal to mid left circumflex artery and proximal LAD, severe MR.  Patient was seen by Dr. Roxy Manns who plans for minimally invasive mitral valve repair.  Patient underwent the planned procedure on 04/20/2019 by Dr. Roxy Manns using a Sorin memo for the ring annuloplasty, artificial Gore-Tex neo-cord placement x6, triangular resection of the flail segment P2 of the posterior leaflet.  Postprocedure, patient was initially tachypneic and hypoxic after extubation and was treated briefly on BiPAP therapy.  She was able to wean to 2 L nasal cannula on the following day.  Given her high risk of developing postop A. fib, she was placed on amiodarone 200 mg twice daily.  She did develop postop atrial fibrillation after being transferred to progressive floor, amiodarone was increased to 400 mg twice daily and she converted to sinus rhythm.  She is anticoagulated on Coumadin therapy.  Patient presents today for post hospital follow-up.  She continued to cough up some clear phlegm, however she says she has been coughing for the past year and is this is not new for her.  She does not appear to be volume overloaded on physical exam.  I do not hear any crackles in her lungs as well.  I do not think her cough is related to  volume overload.  I plan to obtain a basic metabolic panel, if renal function worsens, I likely will decrease her Lasix or change it to as needed basis.  I think her previous volume overload is likely related to the mitral valve leakage, which has been fixed.  She is still on the amiodarone therapy, instructed her to finish the current course of amiodarone therapy then stop after that.  Otherwise she denies any chest pain or exertional dyspnea.  She has been sleeping well, but does feel tired during the day.  She still does not have too much appetite.  There is a spot under her right breast that still drains a little, however I think this is starting to heal.  Past Medical History:  Diagnosis Date  . AAA (abdominal aortic aneurysm) ([place])   . Arthritis   . Cholelithiasis 2011   s/p cholescystectomy   . Chronic diastolic congestive heart failure ([place])   . COPD (chronic obstructive pulmonary disease) ([place])   . Emphysema   . GERD (gastroesophageal reflux disease)   . History of heartburn   . History of renal cell carcinoma   . Hypercholesteremia   . Hypertension   . Hypertension   . Mitral regurgitation   . Pneumonia 1980's  . Renal cell carcinoma 01/2010   s/p right radical nephrectomy 01/2010,  followed by [facility]  . S/P minimally invasive mitral valve repair 04/20/2019   Complex valvuloplasty including triangular resection of flail segment  of posterior leaflet, artificial Gore-tex neochord placement x6 and 30 mm Sorin Memo 4D ring annuloplasty via right mini thoracotomy approach  . Shortness of breath   . Tuberculosis    Tests positive for PPD. dad had h/o TB.    Past Surgical History:  Procedure Laterality Date  . APPENDECTOMY  1970's  . BUBBLE STUDY  03/18/2019   Procedure: BUBBLE STUDY;  Surgeon: Fay Records, MD;  Location: [facility];  Service: Cardiovascular;;  . CARDIAC CATHETERIZATION  09/2011  . CHOLECYSTECTOMY  01/2010  . DIAGNOSTIC LAPAROSCOPY    . KIDNEY  SURGERY    . LEFT AND RIGHT HEART CATHETERIZATION WITH CORONARY ANGIOGRAM N/A 09/30/2011   Procedure: LEFT AND RIGHT HEART CATHETERIZATION WITH CORONARY ANGIOGRAM;  Surgeon: Candee Furbish, MD;  Location: [facility] CATH LAB;  Service: Cardiovascular;  Laterality: N/A;  . MITRAL VALVE REPAIR Right 04/20/2019   Procedure: MINIMALLY INVASIVE MITRAL VALVE REPAIR (MVR) USING MEMO 4D SIZE 30;  Surgeon: Rexene Alberts, MD;  Location: [facility];  Service: Open Heart Surgery;  Laterality: Right;  . MULTIPLE EXTRACTIONS WITH ALVEOLOPLASTY  10/06/2011   Procedure: MULTIPLE EXTRACION WITH ALVEOLOPLASTY;  Surgeon: Lenn Cal, DDS;  Location: [facility];  Service: Oral Surgery;  Laterality: N/A;  Multiple extraction of tooth #'s 1, 6, 8, 10, 11, 22, 23, 26, 27, 28, 29 with alveoloplasty and Upper right buccal exostoses reductions.  . NEPHRECTOMY RADICAL  01/2010   right   . OVARIAN CYST REMOVAL  1970's   "went through belly button"  . RIGHT/LEFT HEART CATH AND CORONARY ANGIOGRAPHY N/A 03/22/2019   Procedure: RIGHT/LEFT HEART CATH AND CORONARY ANGIOGRAPHY;  Surgeon: [name], MD;  Location: [facility] CV LAB;  Service: Cardiovascular;  Laterality: N/A;  . TEE WITHOUT CARDIOVERSION  10/01/2011   Procedure: TRANSESOPHAGEAL ECHOCARDIOGRAM (TEE);  Surgeon: [name];  Location: [facility];  Service: Cardiovascular;  Laterality: N/A;  . TEE WITHOUT CARDIOVERSION N/A 03/18/2019   Procedure: TRANSESOPHAGEAL ECHOCARDIOGRAM (TEE);  Surgeon: Fay Records, MD;  Location: [facility];  Service: Cardiovascular;  Laterality: N/A;  . TEE WITHOUT CARDIOVERSION N/A 04/20/2019   Procedure: TRANSESOPHAGEAL ECHOCARDIOGRAM (TEE);  Surgeon: Rexene Alberts, MD;  Location: [facility];  Service: Open Heart Surgery;  Laterality: N/A;  . TUBAL LIGATION  1970's  . US ECHOCARDIOGRAPHY  09/2011    Current Medications: Outpatient Medications Prior to Visit  Medication Sig Dispense Refill  . albuterol (PROVENTIL) (2.5 MG/3ML)  0.083% nebulizer solution Take 3 mLs (2.5 mg total) by nebulization every 6 (six) hours as needed for wheezing or shortness of breath. 150 mL 3  . amiodarone (PACERONE) 200 MG tablet Take 1 tablet (200 mg total) by mouth 2 (two) times daily. 60 tablet 2  . amLODipine (NORVASC) 5 MG tablet Take 1 tablet (5 mg total) by mouth daily. 30 tablet 2  . aspirin EC 81 MG tablet Take 1 tablet (81 mg total) by mouth daily. 30 tablet 0  . atorvastatin (LIPITOR) 40 MG tablet Take 1 tablet (40 mg total) by mouth daily at 6 PM. 90 tablet 1  . furosemide (LASIX) 40 MG tablet Take 1 tablet (40 mg total) by mouth daily. 14 tablet 1  . metoprolol tartrate (LOPRESSOR) 25 MG tablet Take 1 tablet (25 mg total) by mouth 2 (two) times daily. 60 tablet 0  . Misc. Devices (PULSE OXIMETER FOR FINGER) MISC 1 Device by Does not apply route as needed. 1 each 0  . mometasone-formoterol (DULERA) 200-5 MCG/ACT AERO Inhale  2 puffs into the lungs 2 (two) times daily.    Marland Kitchen oxyCODONE-acetaminophen (PERCOCET) 5-325 MG tablet Take 1 tablet by mouth every 4 (four) hours as needed for severe pain. 20 tablet 0  . potassium chloride SA (K-DUR) 20 MEQ tablet Take 1 tablet (20 mEq total) by mouth daily. 30 tablet 3  . warfarin (COUMADIN) 2.5 MG tablet Take 1 tablet (2.5 mg total) by mouth daily at 6 PM. 30 tablet 2  . fenofibrate 160 MG tablet Take 1 tablet (160 mg total) by mouth daily. (Patient not taking: Reported on 05/05/2019) 90 tablet 1   No facility-administered medications prior to visit.      Allergies:   Ace inhibitors   Social History   Socioeconomic History  . Marital status: Married    Spouse name: Not on file  . Number of children: 3  . Years of education: 46  . Highest education level: Not on file  Occupational History  . 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 Financial resource strain: Not on file  . Food insecurity    Worry: Not on file     Inability: Not on file  . Transportation needs    Medical: Not on file    Non-medical: Not on file  Tobacco Use  . Smoking status: Former Smoker    Packs/day: 0.50    Years: 53.00    Pack years: 26.50    Types: Cigarettes    Quit date: 03/18/2019    Years since quitting: 0.1  . Smokeless tobacco: Never Used  Substance and Sexual Activity  . Alcohol use: Yes    Alcohol/week: 0.0 standard drinks    Comment:  "drink very rarely"  . Drug use: No    Types: Cocaine    Comment: hx of cocaine last in 2006  . Sexual activity: Never  Lifestyle  . Physical activity    Days per week: Not on file    Minutes per session: Not on file  . Stress: Not on file  Relationships  . Social Herbalist on phone: Not on file    Gets together: Not on file    Attends religious service: Not on file    Active member of club or organization: Not on file    Attends meetings of clubs or organizations: Not on file    Relationship status: Not on file  Other Topics Concern  . Not on file  Social History Narrative          Lives in [place] with her husband.    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    Patient has 3 grown children that do not live with her.    Patient does not have any medical insurance.     Family History:  The patient's family history includes COPD in her mother; Diabetes in her brother and brother.   ROS:   Please see the history of present illness.    ROS All other systems reviewed and are negative.   PHYSICAL EXAM:   VS:  BP 110/70   Pulse 65   Temp (!) 95 F (35 C)   Ht 5\' 1"  (1.549 m)   Wt 138 lb 12.8 oz (63 kg)   SpO2 98%   BMI 26.23 kg/m    GEN: Well nourished, well developed, in no acute distress  HEENT: normal  Neck: no  JVD, carotid bruits, or masses Cardiac: RRR; no murmurs, rubs, or gallops,no edema  Respiratory:  clear to auscultation bilaterally, normal work of breathing GI: soft, nontender, nondistended,  + BS MS: no deformity or atrophy  Skin: warm and dry, no rash Neuro:  Alert and Oriented x 3, Strength and sensation are intact Psych: euthymic mood, full affect  Wt Readings from Last 3 Encounters:  05/05/19 138 lb 12.8 oz (63 kg)  04/26/19 136 lb 9.6 oz (62 kg)  04/18/19 141 lb 9 oz (64.2 kg)      Studies/Labs Reviewed:   EKG:  EKG is ordered today.  The ekg ordered today demonstrates normal sinus rhythm with nonspecific T wave changes.  Borderline LVH noted.  Recent Labs: 03/12/2019: B Natriuretic Peptide 817.2 03/13/2019: TSH 0.749 04/04/2019: ALT 24 04/25/2019: Magnesium 2.0 04/26/2019: Hemoglobin 12.0; Platelets 241 05/05/2019: BUN 17; Creatinine, Ser 1.57; Potassium 5.8; Sodium 137   Lipid Panel    Component Value Date/Time   CHOL 223 (H) 09/29/2018 0230   TRIG 271 (H) 09/29/2018 0230   HDL 54 09/29/2018 0230   CHOLHDL 4.1 09/29/2018 0230   VLDL 54 (H) 09/29/2018 0230   LDLCALC 115 (H) 09/29/2018 0230   LDLDIRECT 121.0 04/29/2017 1122    Additional studies/ records that were reviewed today include:   MVR 04/20/2019  Minimally-Invasive Mitral Valve Repair             Complex valvuloplasty including triangular resection of flail segment (P2) of posterior leaflet             Folding leaflet plasty             Artificial Gore-tex neochord placement x6             Sorin Memo 4D Ring Annuloplasty (size 37mm, catalog # [id number], serial # [id number])     ASSESSMENT:    1. S/P mitral valve repair   2. Chronic diastolic congestive heart failure ([place])   3. Coronary artery disease involving native coronary artery of native heart without angina pectoris   4. Essential hypertension   5. Hyperlipidemia, unspecified hyperlipidemia type   6. H/O right nephrectomy   7. CKD (chronic kidney disease) stage 3, GFR 30-59 ml/min (HCC)   8. Paroxysmal atrial fibrillation (HCC)   9. Chronic cough      PLAN:  In order of problems listed above:  1. Severe MR status post mitral valve  repair: Doing well after the surgery.  No significant heart murmur noted on physical exam.  She does have a wound under her right breast that was draining a little, however I think this is healing.  2. Chronic diastolic heart failure: Obtain basic metabolic panel, I likely will scale back on her diuretic if her renal function is stable since her risk of acute heart failure is less with repaired mitral valve.  3. CAD: Minimal disease on previous cardiac catheterization  4. Hypertension: Blood pressure stable  5. Hyperlipidemia: Continue statin  6. CKD stage III: History of right nephrectomy.  Obtain basic metabolic panel  7. Postop atrial fibrillation: Occurred recently after mitral valve repair.  She is maintaining sinus rhythm.  She currently is on amiodarone 200 mg twice daily.  I recommended she finish the current course of amiodarone then stop after that.  If she does develop recurrent atrial fibrillation in the future, would then we can consider systemic anticoagulation therapy.  8. Chronic cough: Does not appears to be related to acute heart failure, her lungs is  clear, apparently she has been coughing for a long time now and is this is not new.    Medication Adjustments/Labs and Tests Ordered: Current medicines are reviewed at length with the patient today.  Concerns regarding medicines are outlined above.  Medication changes, Labs and Tests ordered today are listed in the Patient Instructions below. Patient Instructions  Medication Instructions:   FINISH CURRENT AMIODARONE Your physician recommends that you continue on your current medications as directed. Please refer to the Current Medication list given to you today.  If you need a refill on your cardiac medications before your next appointment, please call your pharmacy.   Lab work: YOU WILL NEED TO HAVE LABS (BLOOD WORK) DRAWN TODAY:  BMET If you have labs (blood work) drawn today and your tests are completely normal, you  will receive your results only by: Marland Kitchen MyChart Message (if you have MyChart) OR . A paper copy in the mail If you have any lab test that is abnormal or we need to change your treatment, we will call you to review the results.  Testing/Procedures: NONE ordered at this time of appointment   Follow-Up: At [facility], you and your health needs are our priority.  As part of our continuing mission to provide you with exceptional heart care, we have created designated Provider Care Teams.  These Care Teams include your primary Cardiologist (physician) and Advanced Practice Providers (APPs -  Physician Assistants and Nurse Practitioners) who all work together to provide you with the care you need, when you need it. You will need a follow up appointment in 2-3 months.  Please call our office 2 months in advance to schedule this appointment.  You may see Skeet Latch, MD or one of the following Advanced Practice Providers on your designated Care Team:   Kerin Ransom, PA-C Roby Lofts, [place] . Sande Rives, PA-C  Any Other Special Instructions Will Be Listed Below (If Applicable).       Hilbert Corrigan, [place]  05/07/2019 11:20 PM    [facility] [address], [place], [place]  [address] Phone: [phone]; Fax: [phone]

## 2019-05-06 ENCOUNTER — Telehealth: Payer: Self-pay

## 2019-05-06 ENCOUNTER — Telehealth: Payer: Self-pay | Admitting: Physician Assistant

## 2019-05-06 LAB — BASIC METABOLIC PANEL
BUN/Creatinine Ratio: 11 — ABNORMAL LOW (ref 12–28)
BUN: 17 mg/dL (ref 8–27)
CO2: 22 mmol/L (ref 20–29)
Calcium: 10.1 mg/dL (ref 8.7–10.3)
Chloride: 97 mmol/L (ref 96–106)
Creatinine, Ser: 1.57 mg/dL — ABNORMAL HIGH (ref 0.57–1.00)
GFR calc Af Amer: 38 mL/min/{1.73_m2} — ABNORMAL LOW (ref >59)
GFR calc non Af Amer: 33 mL/min/{1.73_m2} — ABNORMAL LOW (ref >59)
Glucose: 84 mg/dL (ref 65–99)
Potassium: 5.8 mmol/L (ref 3.5–5.2)
Sodium: 137 mmol/L (ref 134–144)

## 2019-05-06 NOTE — Telephone Encounter (Addendum)
Left a message for the patient to call the office back for lab resutls and information on a change to one of her medications.  ----- Message from Almyra Deforest, [place] sent at 05/06/2019  8:14 AM EDT ----- Potassium very high, stop potassium supplement, I think she is dry, change lasix to 20mg  as needed for swelling and shortness of breath

## 2019-05-06 NOTE — Telephone Encounter (Signed)
[facility] called for a critical potassium of 5.8. Patient attempted to be reached at [phone] but no answer. Will pass message on to AM team to reach out again to patient for repeat blood work and to d/c potassium supplementation.

## 2019-05-07 ENCOUNTER — Encounter: Payer: Self-pay | Admitting: Physician Assistant

## 2019-05-10 DIAGNOSIS — S32030A Wedge compression fracture of third lumbar vertebra, initial encounter for closed fracture: Secondary | ICD-10-CM | POA: Diagnosis not present

## 2019-05-10 DIAGNOSIS — S92309A Fracture of unspecified metatarsal bone(s), unspecified foot, initial encounter for closed fracture: Secondary | ICD-10-CM | POA: Diagnosis not present

## 2019-05-10 NOTE — Progress Notes (Signed)
The patient has been notified of the result and verbalized understanding.  All questions (if any) were answered. Katie [name], [place] 05/10/2019 9:04 AM

## 2019-05-10 NOTE — Progress Notes (Signed)
Dose change of Lasix by Almyra Deforest, PA-C

## 2019-05-13 ENCOUNTER — Other Ambulatory Visit (HOSPITAL_COMMUNITY): Payer: Medicare Other

## 2019-05-13 ENCOUNTER — Other Ambulatory Visit: Payer: Self-pay | Admitting: Thoracic Surgery (Cardiothoracic Vascular Surgery)

## 2019-05-13 ENCOUNTER — Telehealth: Payer: Self-pay

## 2019-05-13 DIAGNOSIS — Z9889 Other specified postprocedural states: Secondary | ICD-10-CM

## 2019-05-13 NOTE — Telephone Encounter (Signed)
Left a detailed message for the patient about calling her to go over the [place] prescreen questions for her upcoming appointment and to give our office a call back.

## 2019-05-16 ENCOUNTER — Other Ambulatory Visit: Payer: Self-pay

## 2019-05-16 ENCOUNTER — Ambulatory Visit
Admission: RE | Admit: 2019-05-16 | Discharge: 2019-05-16 | Disposition: A | Discharge status: Home / Self Care | Payer: Medicare Other | Source: Ambulatory Visit | Attending: Thoracic Surgery (Cardiothoracic Vascular Surgery) | Admitting: Thoracic Surgery (Cardiothoracic Vascular Surgery)

## 2019-05-16 ENCOUNTER — Encounter: Payer: Self-pay | Admitting: Thoracic Surgery (Cardiothoracic Vascular Surgery)

## 2019-05-16 ENCOUNTER — Other Ambulatory Visit: Payer: Self-pay | Admitting: *Deleted

## 2019-05-16 ENCOUNTER — Ambulatory Visit (INDEPENDENT_AMBULATORY_CARE_PROVIDER_SITE_OTHER): Payer: Self-pay | Admitting: Thoracic Surgery (Cardiothoracic Vascular Surgery)

## 2019-05-16 VITALS — BP 117/86 | HR 98 | Temp 96.3°F | Resp 16 | Ht 61.0 in | Wt 140.0 lb

## 2019-05-16 DIAGNOSIS — Z9889 Other specified postprocedural states: Secondary | ICD-10-CM

## 2019-05-16 DIAGNOSIS — I340 Nonrheumatic mitral (valve) insufficiency: Secondary | ICD-10-CM

## 2019-05-16 DIAGNOSIS — J9811 Atelectasis: Secondary | ICD-10-CM | POA: Diagnosis not present

## 2019-05-16 NOTE — Progress Notes (Signed)
[address]       [place] [address]             [phone]     CARDIOTHORACIC SURGERY OFFICE NOTE  Referring Provider is Skeet Latch, MD Primary Cardiologist is Skeet Latch, MD PCP is Delorse Limber   HPI:  Patient is a 73 year old female with history of mitral valve prolapse with mitral regurgitation, hypertension, abdominal aortic aneurysm, COPD, and longstanding tobacco abuse having just recently quit who returns to the office today for routine follow-up status post minimally invasive mitral valve repair on April 20, 2019.  The patient's early postoperative recovery was notable for brief episode of postoperative atrial fibrillation for which she was treated using amiodarone.  She was discharged from the hospital on the sixth postoperative day in sinus rhythm.  Since hospital discharge she has been seen on one occasion at [facility].  She was maintaining sinus rhythm at that time.  Amiodarone was stopped and Lasix was decreased.  She returns her office today and reports that she is doing exceptionally well.  She states that her breathing is dramatically better than it was prior to surgery.  She experiences no shortness of breath whatsoever.  She has mild residual soreness in her chest.  She otherwise has no complaints.  She is driving a car and quite active physically.  She is delighted with her progress.  She remains anticoagulated using warfarin.   Current Outpatient Medications  Medication Sig Dispense Refill  . albuterol (PROVENTIL) (2.5 MG/3ML) 0.083% nebulizer solution Take 3 mLs (2.5 mg total) by nebulization every 6 (six) hours as needed for wheezing or shortness of breath. 150 mL 3  . amLODipine (NORVASC) 5 MG tablet Take 1 tablet (5 mg total) by mouth daily. 30 tablet 2  . aspirin EC 81 MG tablet Take 1 tablet (81 mg total) by mouth daily. 30 tablet 0  . atorvastatin (LIPITOR) 40 MG tablet Take 1 tablet (40 mg total) by mouth daily  at 6 PM. 90 tablet 1  . fenofibrate 160 MG tablet Take 1 tablet (160 mg total) by mouth daily. 90 tablet 1  . furosemide (LASIX) 20 MG tablet Take 20 mg by mouth. Take 1 tablet as needed for swelling and/or Shortness of breath    . metoprolol tartrate (LOPRESSOR) 25 MG tablet Take 1 tablet (25 mg total) by mouth 2 (two) times daily. 60 tablet 0  . Misc. Devices (PULSE OXIMETER FOR FINGER) MISC 1 Device by Does not apply route as needed. 1 each 0  . mometasone-formoterol (DULERA) 200-5 MCG/ACT AERO Inhale 2 puffs into the lungs 2 (two) times daily.    Marland Kitchen warfarin (COUMADIN) 2.5 MG tablet Take 1 tablet (2.5 mg total) by mouth daily at 6 PM. 30 tablet 2   No current facility-administered medications for this visit.       Physical Exam:   BP 117/86 (BP Location: Right Arm, Patient Position: Sitting, Cuff Size: Normal)   Pulse 98   Temp (!) 96.3 F (35.7 C) Comment: THERMAL  Resp 16   Ht 5\' 1"  (1.549 m)   Wt 140 lb (63.5 kg)   SpO2 98% Comment: RA  BMI 26.45 kg/m   General:  Well-appearing  Chest:   Clear to auscultation  CV:   Regular rate and rhythm without murmur  Incisions:  Healing nicely  Abdomen:  Soft nontender  Extremities:  Warm and well-perfused  Diagnostic Tests:  CHEST - 2 VIEW  COMPARISON:  04/25/2019  FINDINGS: Upper normal heart size post MVR.  Mediastinal contours and pulmonary vascularity normal.  Atherosclerotic calcification aorta.  Decreased atelectasis RIGHT upper lobe.  Remaining lungs clear.  No acute infiltrate, pleural effusion, or pneumothorax.  Bones demineralized with levoconvex thoracolumbar scoliosis.  IMPRESSION: Post MVR with decreased atelectasis in RIGHT upper lobe since previous exam.   Electronically Signed   By: Lavonia Dana M.D.   On: 05/16/2019 11:45    Impression:  Patient is doing very well less than 1 month status post minimally invasive mitral valve repair  Plan:  I have encouraged the patient to  continue to gradually increase her physical activity as tolerated.  We have not recommended any changes to her current medications, but it might be reasonable to stop Lasix altogether.  We will plan routine follow-up echocardiogram in 4 to 6 weeks.  Patient will return to our office for routine follow-up in 2 months.  The patient has been reminded regarding the importance of dental hygiene and the lifelong need for antibiotic prophylaxis for all dental cleanings and other related invasive procedures.     Valentina Gu. Roxy Manns, MD 05/16/2019 11:59 AM

## 2019-05-16 NOTE — Patient Instructions (Addendum)
Continue all previous medications without any changes at this time.  It might be reasonable to stop lasix  You may return to driving an automobile as long as you are no longer requiring oral narcotic pain relievers during the daytime.  It would be wise to start driving only short distances during the daylight and gradually increase from there as you feel comfortable.  You may continue to gradually increase your physical activity as tolerated.  Refrain from any heavy lifting or strenuous use of your arms and shoulders until at least 8 weeks from the time of your surgery, and avoid activities that cause increased pain in your chest on the side of your surgical incision.  Otherwise you may continue to increase activities without any particular limitations.  Increase the intensity and duration of physical activity gradually.  Endocarditis is a potentially serious infection of heart valves or inside lining of the heart.  It occurs more commonly in patients with diseased heart valves (such as patient's with aortic or mitral valve disease) and in patients who have undergone heart valve repair or replacement.  Certain surgical and dental procedures may put you at risk, such as dental cleaning, other dental procedures, or any surgery involving the respiratory, urinary, gastrointestinal tract, gallbladder or prostate gland.   To minimize your chances for develooping endocarditis, maintain good oral health and seek prompt medical attention for any infections involving the mouth, teeth, gums, skin or urinary tract.    Always notify your doctor or dentist about your underlying heart valve condition before having any invasive procedures. You will need to take antibiotics before certain procedures, including all routine dental cleanings or other dental procedures.  Your cardiologist or dentist should prescribe these antibiotics for you to be taken ahead of time.

## 2019-05-17 ENCOUNTER — Ambulatory Visit (INDEPENDENT_AMBULATORY_CARE_PROVIDER_SITE_OTHER): Payer: Medicare Other | Admitting: Pharmacist Clinician (PhC)/ Clinical Pharmacy Specialist

## 2019-05-17 DIAGNOSIS — I340 Nonrheumatic mitral (valve) insufficiency: Secondary | ICD-10-CM

## 2019-05-17 DIAGNOSIS — Z7901 Long term (current) use of anticoagulants: Secondary | ICD-10-CM

## 2019-05-17 DIAGNOSIS — Z9889 Other specified postprocedural states: Secondary | ICD-10-CM

## 2019-05-17 DIAGNOSIS — I4891 Unspecified atrial fibrillation: Secondary | ICD-10-CM | POA: Diagnosis not present

## 2019-05-17 LAB — POCT INR: INR: 3.2 — AB (ref 2.0–3.0)

## 2019-05-17 NOTE — Telephone Encounter (Signed)
Katie [name], were you ever able to reach [name]?

## 2019-05-17 NOTE — Telephone Encounter (Signed)
Patient called me back on 05/10/2019 see below note  recorded by Jacqulynn Cadet, CMA on 05/10/2019 at 9:04 AM EDT  The patient has been notified of the result and verbalized understanding. All questions (if any) were answered.

## 2019-05-17 NOTE — Patient Instructions (Signed)
Decrease dose to 1 tablet daily except 1/2 tablet each Sunday, Tuesday and Thursday.  Repeat INR in 10-14 days

## 2019-05-18 ENCOUNTER — Telehealth (HOSPITAL_COMMUNITY): Payer: Self-pay

## 2019-06-01 ENCOUNTER — Telehealth: Payer: Self-pay

## 2019-06-01 NOTE — Telephone Encounter (Signed)
lmomed for overdue inr 

## 2019-06-10 DIAGNOSIS — S92309A Fracture of unspecified metatarsal bone(s), unspecified foot, initial encounter for closed fracture: Secondary | ICD-10-CM | POA: Diagnosis not present

## 2019-06-13 ENCOUNTER — Other Ambulatory Visit (HOSPITAL_COMMUNITY): Payer: Medicare Other

## 2019-06-15 ENCOUNTER — Telehealth: Payer: Self-pay

## 2019-06-15 NOTE — Telephone Encounter (Signed)
lmom for missed appt 

## 2019-07-01 ENCOUNTER — Encounter (HOSPITAL_COMMUNITY): Payer: Self-pay | Admitting: Thoracic Surgery (Cardiothoracic Vascular Surgery)

## 2019-07-08 ENCOUNTER — Other Ambulatory Visit (HOSPITAL_COMMUNITY): Payer: Medicare Other

## 2019-07-11 DIAGNOSIS — S92309A Fracture of unspecified metatarsal bone(s), unspecified foot, initial encounter for closed fracture: Secondary | ICD-10-CM | POA: Diagnosis not present

## 2019-07-13 ENCOUNTER — Telehealth (HOSPITAL_COMMUNITY): Payer: Self-pay

## 2019-07-13 NOTE — Telephone Encounter (Signed)
New message    Just an FYI. We have made several attempts to contact this patient including sending a letter to schedule or reschedule their echocardiogram. We will be removing the patient from the echo WQ.   9.18.20 no show  9.11.20 mail reminder letter Katie [name]  8.24.20  no show - Katie [name]

## 2019-07-17 ENCOUNTER — Other Ambulatory Visit: Payer: Self-pay | Admitting: Physician Assistant

## 2019-07-17 DIAGNOSIS — I10 Essential (primary) hypertension: Secondary | ICD-10-CM

## 2019-07-17 DIAGNOSIS — E782 Mixed hyperlipidemia: Secondary | ICD-10-CM

## 2019-07-18 ENCOUNTER — Encounter: Payer: Self-pay | Admitting: Thoracic Surgery (Cardiothoracic Vascular Surgery)

## 2019-07-18 NOTE — Progress Notes (Signed)
This encounter was created in error - please disregard.

## 2019-07-18 NOTE — Patient Instructions (Signed)
Continue all previous medications without any changes at this time  It would be reasonable to stop taking warfarin (Coumadin) if your cardiologist (Dr. Oval Linsey) agrees once your follow up echocardiogram has been done  You may resume unrestricted physical activity without any particular limitations at this time.  Endocarditis is a potentially serious infection of heart valves or inside lining of the heart.  It occurs more commonly in patients with diseased heart valves (such as patient's with aortic or mitral valve disease) and in patients who have undergone heart valve repair or replacement.  Certain surgical and dental procedures may put you at risk, such as dental cleaning, other dental procedures, or any surgery involving the respiratory, urinary, gastrointestinal tract, gallbladder or prostate gland.   To minimize your chances for develooping endocarditis, maintain good oral health and seek prompt medical attention for any infections involving the mouth, teeth, gums, skin or urinary tract.    Always notify your doctor or dentist about your underlying heart valve condition before having any invasive procedures. You will need to take antibiotics before certain procedures, including all routine dental cleanings or other dental procedures.  Your cardiologist or dentist should prescribe these antibiotics for you to be taken ahead of time.

## 2019-08-05 ENCOUNTER — Ambulatory Visit: Payer: Medicare Other | Admitting: Cardiovascular Disease

## 2019-08-10 DIAGNOSIS — S92309A Fracture of unspecified metatarsal bone(s), unspecified foot, initial encounter for closed fracture: Secondary | ICD-10-CM | POA: Diagnosis not present

## 2019-08-26 ENCOUNTER — Other Ambulatory Visit: Payer: Self-pay | Admitting: *Deleted

## 2019-08-26 MED ORDER — DULERA 200-5 MCG/ACT IN AERO: 2.0000 | INHALATION_SPRAY | Freq: Two times a day (BID) | RESPIRATORY_TRACT | 0 refills | Status: DC

## 2019-08-29 ENCOUNTER — Ambulatory Visit: Payer: Medicare Other | Admitting: Physician Assistant

## 2019-09-05 ENCOUNTER — Other Ambulatory Visit: Payer: Self-pay | Admitting: *Deleted

## 2019-09-05 DIAGNOSIS — Z122 Encounter for screening for malignant neoplasm of respiratory organs: Secondary | ICD-10-CM

## 2019-09-05 DIAGNOSIS — F17210 Nicotine dependence, cigarettes, uncomplicated: Secondary | ICD-10-CM

## 2019-09-05 DIAGNOSIS — Z87891 Personal history of nicotine dependence: Secondary | ICD-10-CM

## 2019-09-10 DIAGNOSIS — S92309A Fracture of unspecified metatarsal bone(s), unspecified foot, initial encounter for closed fracture: Secondary | ICD-10-CM | POA: Diagnosis not present

## 2019-10-10 DIAGNOSIS — S92309A Fracture of unspecified metatarsal bone(s), unspecified foot, initial encounter for closed fracture: Secondary | ICD-10-CM | POA: Diagnosis not present

## 2019-10-31 ENCOUNTER — Encounter: Payer: Self-pay | Admitting: Physician Assistant

## 2019-10-31 ENCOUNTER — Other Ambulatory Visit: Payer: Self-pay

## 2019-10-31 ENCOUNTER — Ambulatory Visit (INDEPENDENT_AMBULATORY_CARE_PROVIDER_SITE_OTHER): Payer: Medicare Other | Admitting: Physician Assistant

## 2019-10-31 VITALS — BP 138/98 | HR 107 | Temp 97.7°F | Resp 16 | Ht 61.0 in | Wt 136.0 lb

## 2019-10-31 DIAGNOSIS — E785 Hyperlipidemia, unspecified: Secondary | ICD-10-CM

## 2019-10-31 DIAGNOSIS — Z720 Tobacco use: Secondary | ICD-10-CM

## 2019-10-31 DIAGNOSIS — J449 Chronic obstructive pulmonary disease, unspecified: Secondary | ICD-10-CM

## 2019-10-31 DIAGNOSIS — I10 Essential (primary) hypertension: Secondary | ICD-10-CM | POA: Diagnosis not present

## 2019-10-31 DIAGNOSIS — M25561 Pain in right knee: Secondary | ICD-10-CM

## 2019-10-31 DIAGNOSIS — Z0000 Encounter for general adult medical examination without abnormal findings: Secondary | ICD-10-CM | POA: Diagnosis not present

## 2019-10-31 DIAGNOSIS — N183 Chronic kidney disease, stage 3 unspecified: Secondary | ICD-10-CM | POA: Diagnosis not present

## 2019-10-31 DIAGNOSIS — G8929 Other chronic pain: Secondary | ICD-10-CM

## 2019-10-31 DIAGNOSIS — Z23 Encounter for immunization: Secondary | ICD-10-CM | POA: Diagnosis not present

## 2019-10-31 MED ORDER — LOSARTAN POTASSIUM 100 MG PO TABS: 100.0000 mg | ORAL_TABLET | Freq: Every day | ORAL | 3 refills | Status: DC

## 2019-10-31 MED ORDER — DULERA 200-5 MCG/ACT IN AERO: 2.0000 | INHALATION_SPRAY | Freq: Two times a day (BID) | RESPIRATORY_TRACT | 0 refills | Status: DC

## 2019-10-31 MED ORDER — METOPROLOL TARTRATE 25 MG PO TABS: 25.0000 mg | ORAL_TABLET | Freq: Two times a day (BID) | ORAL | 3 refills | Status: DC

## 2019-10-31 NOTE — Progress Notes (Signed)
Patient presents to clinic today for annual exam.  Patient is fasting for labs.  Acute Concerns: Patient endorses ongoing issue with her right lower extremity after injury this past year.  Patient notes intermittent pain of knee, mostly concentrated medially with some associated soft tissue swelling.  Patient notes continued darkening of the skin in this area.  Patient notes some numbness in the area since the incident.  Has been followed by orthopedics and spine surgery after the incident.  States no one has taken a look at her knee and leg, only being concentrated on her lower back, foot and ankle.  Would like referral to specialist.  Health Maintenance: Immunizations -- Agrees to flu shot today.  Colonoscopy -- Patient refuses colorectal screening Mammogram -- Declines mammogram.  Past Medical History:  Diagnosis Date   AAA (abdominal aortic aneurysm) ([place])    Arthritis    Cholelithiasis 2011   s/p cholescystectomy    Chronic diastolic congestive heart failure (HCC)    COPD (chronic obstructive pulmonary disease) (HCC)    Emphysema    GERD (gastroesophageal reflux disease)    History of heartburn    History of renal cell carcinoma    Hypercholesteremia    Hypertension    Hypertension    Mitral regurgitation    Pneumonia 1980's   Renal cell carcinoma 01/2010   s/p right radical nephrectomy 01/2010,  followed [facility]   S/P minimally invasive mitral valve repair 04/20/2019   Complex valvuloplasty including triangular resection of flail segment of posterior leaflet, artificial Gore-tex neochord placement x6 and 30 mm Sorin Memo 4D ring annuloplasty via right mini thoracotomy approach   Shortness of breath    Tuberculosis    Tests positive for PPD. dad had h/o TB.    Past Surgical History:  Procedure Laterality Date   APPENDECTOMY  1970's   BUBBLE STUDY  03/18/2019   Procedure: BUBBLE STUDY;  Surgeon: Fay Records, MD;  Location: [facility];   Service: Cardiovascular;;   CARDIAC CATHETERIZATION  09/2011   CHOLECYSTECTOMY  01/2010   DIAGNOSTIC LAPAROSCOPY     KIDNEY SURGERY     LEFT AND RIGHT HEART CATHETERIZATION WITH CORONARY ANGIOGRAM N/A 09/30/2011   Procedure: LEFT AND RIGHT HEART CATHETERIZATION WITH CORONARY ANGIOGRAM;  Surgeon: Candee Furbish, MD;  Location: [facility] C[facility] LAB;  Service: Cardiovascular;  Laterality: N/A;   MITRAL VALVE REPAIR Right 04/20/2019   Procedure: MINIMALLY INVASIVE MITRAL VALVE REPAIR (MVR) USING MEMO 4D SIZE 30;  Surgeon: Rexene Alberts, MD;  Location: [place];  Service: Open Heart Surgery;  Laterality: Right;   MULTIPLE EXTRACTIONS WITH ALVEOLOPLASTY  10/06/2011   Procedure: MULTIPLE EXTRACION WITH ALVEOLOPLASTY;  Surgeon: Lenn Cal, DDS;  Location: [place];  Service: Oral Surgery;  Laterality: N/A;  Multiple extraction of tooth #'s 1, 6, 8, 10, 11, 22, 23, 26, 27, 28, 29 with alveoloplasty and Upper right buccal exostoses reductions.   NEPHRECTOMY RADICAL  01/2010   right    OVARIAN CYST REMOVAL  1970's   "went through belly button"   RIGHT/LEFT HEART CATH AND CORONARY ANGIOGRAPHY N/A 03/22/2019   Procedure: RIGHT/LEFT HEART CATH AND CORONARY ANGIOGRAPHY;  Surgeon: [name], MD;  Location: [facility] CV LAB;  Service: Cardiovascular;  Laterality: N/A;   TEE WITHOUT CARDIOVERSION  10/01/2011   Procedure: TRANSESOPHAGEAL ECHOCARDIOGRAM (TEE);  Surgeon: [name];  Location: [place];  Service: Cardiovascular;  Laterality: N/A;   TEE WITHOUT CARDIOVERSION N/A 03/18/2019   Procedure: TRANSESOPHAGEAL  ECHOCARDIOGRAM (TEE);  Surgeon: Fay Records, MD;  Location: [place];  Service: Cardiovascular;  Laterality: N/A;   TEE WITHOUT CARDIOVERSION N/A 04/20/2019   Procedure: TRANSESOPHAGEAL ECHOCARDIOGRAM (TEE);  Surgeon: Rexene Alberts, MD;  Location: [place];  Service: Open Heart Surgery;  Laterality: N/A;   TUBAL LIGATION  1970's   US ECHOCARDIOGRAPHY  09/2011     Current Outpatient Medications on File Prior to Visit  Medication Sig Dispense Refill   albuterol (PROVENTIL) (2.5 MG/3ML) 0.083% nebulizer solution Take 3 mLs (2.5 mg total) by nebulization every 6 (six) hours as needed for wheezing or shortness of breath. 150 mL 3   amLODipine (NORVASC) 5 MG tablet Take 1 tablet (5 mg total) by mouth daily. 30 tablet 2   aspirin EC 81 MG tablet Take 1 tablet (81 mg total) by mouth daily. 30 tablet 0   atorvastatin (LIPITOR) 40 MG tablet TAKE 1 TABLET BY MOUTH ONCE DAILY AT  6  PM 30 tablet 0   fenofibrate 160 MG tablet Take 1 tablet (160 mg total) by mouth daily. 90 tablet 1   furosemide (LASIX) 20 MG tablet Take 20 mg by mouth. Take 1 tablet as needed for swelling and/or Shortness of breath     losartan (COZAAR) 100 MG tablet Take 1 tablet by mouth once daily 30 tablet 0   metoprolol tartrate (LOPRESSOR) 25 MG tablet Take 1 tablet (25 mg total) by mouth 2 (two) times daily. 60 tablet 0   Misc. Devices (PULSE OXIMETER FOR FINGER) MISC 1 Device by Does not apply route as needed. 1 each 0   mometasone-formoterol (DULERA) 200-5 MCG/ACT AERO Inhale 2 puffs into the lungs 2 (two) times daily. 16 g 0   warfarin (COUMADIN) 2.5 MG tablet Take 1 tablet (2.5 mg total) by mouth daily at 6 PM. 30 tablet 2   No current facility-administered medications on file prior to visit.    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    Patient has 3 grown children that do not live with  her.    Patient does not have any medical insurance.   Social Determinants of Health   Financial Resource Strain:    Difficulty of Paying Living Expenses: Not on file  Food Insecurity:    Worried About Charity fundraiser in the Last Year: Not on file   [employer] of Food in the Last Year: Not on file  Transportation Needs:    Lack of Transportation (Medical): Not on file   Lack of Transportation (Non-Medical): Not on file  Physical Activity:    Days of Exercise per Week: Not on file   Minutes of Exercise per Session: Not on file  Stress:    Feeling of Stress : Not on file  Social Connections:    Frequency of Communication with Friends and Family: Not on file   Frequency of Social Gatherings with Friends and Family: Not on file   Attends Religious Services: Not on file   Active Member of Clubs or Organizations: Not on file   Attends Archivist Meetings: Not on file   Marital Status: Not on file  Intimate Partner Violence:    Fear of Current or Ex-Partner: Not on file   Emotionally Abused: Not on file   Physically Abused: Not on file   Sexually Abused: Not on file   Review of Systems  Constitutional: Negative for fever and weight loss.  HENT: Negative for ear discharge, ear pain, hearing loss and  tinnitus.   Eyes: Negative for blurred vision, double vision, photophobia and pain.  Respiratory: Negative for cough and shortness of breath.   Cardiovascular: Negative for chest pain and palpitations.  Gastrointestinal: Negative for abdominal pain, blood in stool, constipation, diarrhea, heartburn, melena, nausea and vomiting.  Genitourinary: Negative for dysuria, flank pain, frequency, hematuria and urgency.  Musculoskeletal: Positive for joint pain and myalgias. Negative for falls.  Neurological: Positive for sensory change. Negative for dizziness, loss of consciousness and headaches.  Endo/Heme/Allergies: Negative for environmental allergies.  Psychiatric/Behavioral: Negative for depression, hallucinations, substance abuse and suicidal ideas. The patient is not nervous/anxious and does not have insomnia.    Wt 136 lb (61.7 kg)    BMI 25.70 kg/m   Physical Exam Vitals reviewed.  Constitutional:      Appearance: Normal appearance.  HENT:     Head: Normocephalic and atraumatic.     Right Ear: Tympanic membrane normal.     Left Ear: Tympanic membrane normal.  Eyes:     Conjunctiva/sclera: Conjunctivae normal.     Pupils: Pupils are equal, round, and reactive to light.  Cardiovascular:     Rate and Rhythm: Normal rate and regular rhythm.     Pulses: Normal pulses.  Pulmonary:     Effort: Pulmonary effort is normal.     Breath sounds: Normal breath sounds.  Abdominal:     General: Bowel sounds are normal. There is no distension.     Palpations: Abdomen is soft.     Tenderness: There is no abdominal tenderness.  Musculoskeletal:     Cervical back: Neck supple.     Right knee: No deformity, effusion, erythema or ecchymosis. Normal range of motion. Tenderness present over the medial joint line.     Left knee: No deformity or effusion.  Neurological:     General: No focal deficit present.     Mental Status: She is alert and oriented to person, place, and time.  Psychiatric:         Mood and Affect: Mood normal.  Assessment/Plan: 1. Visit for preventive health examination Depression screen negative. Health Maintenance reviewed. Preventive schedule discussed and handout given in AVS. Will obtain fasting labs today.   2. Essential hypertension Mild elevation in diastolic BP. Has not taken medication yet today. Restart DASH diet. Take medications once home.  - losartan (COZAAR) 100 MG tablet; Take 1 tablet (100 mg total) by mouth daily.  Dispense: 30 tablet; Refill: 3  3. Stage 3 chronic kidney disease, unspecified whether stage 3a or 3b CKD Repeat labs today including Vitamin D. - CBC w/Diff - Comp Met (CMET) - Hemoglobin A1c - Vitamin D (25 hydroxy)  4. Hyperlipidemia, unspecified hyperlipidemia type Not fully compliant with statin medication. Repeat labs today to reassess. Dietary recommendations reviewed.  - Lipid Profile - Hemoglobin A1c  5. COPD Medications refilled. Stable today. Lungs CTAB which is a great finding for her as there is typically some degree of wheeze. She is overdue for Pulmonology follow-up and will need to schedule. Is also overdue for lung cancer screening -- previously scheduled but patient canceled. She is to call and reschedule this.  - CBC w/Diff  6. Tobacco abuse disorder Declines readiness or willingness to quit.  7. Need for immunization against influenza - Flu Vaccine QUAD High Dose(Fluad)  8. Chronic pain of right knee Referral to Orthopedics placed for a Knee specialist per patient request.  Supportive measures reviewed.   This visit occurred during the SARS-CoV-2 public health emergency.  Safety protocols were in place, including screening questions prior to the visit, additional usage of staff PPE, and extensive cleaning of exam room while observing appropriate contact time as indicated for disinfecting solutions.     [name], PA-C

## 2019-10-31 NOTE — Patient Instructions (Addendum)
Please go to the lab for blood work.   Our office will call you with your results unless you have chosen to receive results via MyChart.  If your blood work is normal we will follow-up each year for physicals and as scheduled for chronic medical problems.  If anything is abnormal we will treat accordingly and get you in for a follow-up.  You will be contacted by the Knee specialist for further assessment of ongoing knee pain since your accident.  Make sure to contact Dr. Oval Linsey and schedule follow-up ASAP.   Preventive Care 10 Years and Older, Female Preventive care refers to lifestyle choices and visits with your health care provider that can promote health and wellness. This includes:  A yearly physical exam. This is also called an annual well check.  Regular dental and eye exams.  Immunizations.  Screening for certain conditions.  Healthy lifestyle choices, such as diet and exercise. What can I expect for my preventive care visit? Physical exam Your health care provider will check:  Height and weight. These may be used to calculate body mass index (BMI), which is a measurement that tells if you are at a healthy weight.  Heart rate and blood pressure.  Your skin for abnormal spots. Counseling Your health care provider may ask you questions about:  Alcohol, tobacco, and drug use.  Emotional well-being.  Home and relationship well-being.  Sexual activity.  Eating habits.  History of falls.  Memory and ability to understand (cognition).  Work and work Statistician.  Pregnancy and menstrual history. What immunizations do I need?  Influenza (flu) vaccine  This is recommended every year. Tetanus, diphtheria, and pertussis (Tdap) vaccine  You may need a Td booster every 10 years. Varicella (chickenpox) vaccine  You may need this vaccine if you have not already been vaccinated. Zoster (shingles) vaccine  You may need this after age 5. Pneumococcal  conjugate (PCV13) vaccine  One dose is recommended after age 4. Pneumococcal polysaccharide (PPSV23) vaccine  One dose is recommended after age 73. Measles, mumps, and rubella (MMR) vaccine  You may need at least one dose of MMR if you were born in 1957 or later. You may also need a second dose. Meningococcal conjugate (MenACWY) vaccine  You may need this if you have certain conditions. Hepatitis A vaccine  You may need this if you have certain conditions or if you travel or work in places where you may be exposed to hepatitis A. Hepatitis B vaccine  You may need this if you have certain conditions or if you travel or work in places where you may be exposed to hepatitis B. Haemophilus influenzae type b (Hib) vaccine  You may need this if you have certain conditions. You may receive vaccines as individual doses or as more than one vaccine together in one shot (combination vaccines). Talk with your health care provider about the risks and benefits of combination vaccines. What tests do I need? Blood tests  Lipid and cholesterol levels. These may be checked every 5 years, or more frequently depending on your overall health.  Hepatitis C test.  Hepatitis B test. Screening  Lung cancer screening. You may have this screening every year starting at age 26 if you have a 30-pack-year history of smoking and currently smoke or have quit within the past 15 years.  Colorectal cancer screening. All adults should have this screening starting at age 67 and continuing until age 52. Your health care provider may recommend screening at age 26  if you are at increased risk. You will have tests every 1-10 years, depending on your results and the type of screening test.  Diabetes screening. This is done by checking your blood sugar (glucose) after you have not eaten for a while (fasting). You may have this done every 1-3 years.  Mammogram. This may be done every 1-2 years. Talk with your health care  provider about how often you should have regular mammograms.  BRCA-related cancer screening. This may be done if you have a family history of breast, ovarian, tubal, or peritoneal cancers. Other tests  Sexually transmitted disease (STD) testing.  Bone density scan. This is done to screen for osteoporosis. You may have this done starting at age 21. Follow these instructions at home: Eating and drinking  Eat a diet that includes fresh fruits and vegetables, whole grains, lean protein, and low-fat dairy products. Limit your intake of foods with high amounts of sugar, saturated fats, and salt.  Take vitamin and mineral supplements as recommended by your health care provider.  Do not drink alcohol if your health care provider tells you not to drink.  If you drink alcohol: ? Limit how much you have to 0-1 drink a day. ? Be aware of how much alcohol is in your drink. In the [place] one drink equals one 12 oz bottle of beer (355 mL), one 5 oz glass of wine (148 mL), or one 1 oz glass of hard liquor (44 mL). Lifestyle  Take daily care of your teeth and gums.  Stay active. Exercise for at least 30 minutes on 5 or more days each week.  Do not use any products that contain nicotine or tobacco, such as cigarettes, e-cigarettes, and chewing tobacco. If you need help quitting, ask your health care provider.  If you are sexually active, practice safe sex. Use a condom or other form of protection in order to prevent STIs (sexually transmitted infections).  Talk with your health care provider about taking a low-dose aspirin or statin. What's next?  Go to your health care provider once a year for a well check visit.  Ask your health care provider how often you should have your eyes and teeth checked.  Stay up to date on all vaccines. This information is not intended to replace advice given to you by your health care provider. Make sure you discuss any questions you have with your health care  provider. Document Revised: 09/30/2018 Document Reviewed: 09/30/2018 Elsevier Patient Education  2020 [employer].

## 2019-11-01 LAB — COMPREHENSIVE METABOLIC PANEL
ALT: 8 U/L (ref 0–35)
AST: 15 U/L (ref 0–37)
Albumin: 4.1 g/dL (ref 3.5–5.2)
Alkaline Phosphatase: 98 U/L (ref 39–117)
BUN: 19 mg/dL (ref 6–23)
CO2: 29 mEq/L (ref 19–32)
Calcium: 9.9 mg/dL (ref 8.4–10.5)
Chloride: 104 mEq/L (ref 96–112)
Creatinine, Ser: 1.28 mg/dL — ABNORMAL HIGH (ref 0.40–1.20)
GFR: 41.01 mL/min — ABNORMAL LOW (ref >60.00)
Glucose, Bld: 79 mg/dL (ref 70–99)
Potassium: 4.6 mEq/L (ref 3.5–5.1)
Sodium: 140 mEq/L (ref 135–145)
Total Bilirubin: 0.3 mg/dL (ref 0.2–1.2)
Total Protein: 7.2 g/dL (ref 6.0–8.3)

## 2019-11-01 LAB — CBC WITH DIFFERENTIAL/PLATELET
Basophils Absolute: 0.1 10*3/uL (ref 0.0–0.1)
Basophils Relative: 1.2 % (ref 0.0–3.0)
Eosinophils Absolute: 0.3 10*3/uL (ref 0.0–0.7)
Eosinophils Relative: 3.6 % (ref 0.0–5.0)
HCT: 43.4 % (ref 36.0–46.0)
Hemoglobin: 14.1 g/dL (ref 12.0–15.0)
Lymphocytes Relative: 31.2 % (ref 12.0–46.0)
Lymphs Abs: 2.3 10*3/uL (ref 0.7–4.0)
MCHC: 32.5 g/dL (ref 30.0–36.0)
MCV: 89.6 fl (ref 78.0–100.0)
Monocytes Absolute: 0.4 10*3/uL (ref 0.1–1.0)
Monocytes Relative: 5.6 % (ref 3.0–12.0)
Neutro Abs: 4.2 10*3/uL (ref 1.4–7.7)
Neutrophils Relative %: 58.4 % (ref 43.0–77.0)
Platelets: 335 10*3/uL (ref 150.0–400.0)
RBC: 4.84 Mil/uL (ref 3.87–5.11)
RDW: 14.5 % (ref 11.5–15.5)
WBC: 7.2 10*3/uL (ref 4.0–10.5)

## 2019-11-01 LAB — LIPID PANEL
Cholesterol: 307 mg/dL — ABNORMAL HIGH (ref 0–200)
HDL: 62.6 mg/dL (ref >39.00)
NonHDL: 244.27
Total CHOL/HDL Ratio: 5
Triglycerides: 342 mg/dL — ABNORMAL HIGH (ref 0.0–149.0)
VLDL: 68.4 mg/dL — ABNORMAL HIGH (ref 0.0–40.0)

## 2019-11-01 LAB — LDL CHOLESTEROL, DIRECT: Direct LDL: 151 mg/dL

## 2019-11-01 LAB — VITAMIN D 25 HYDROXY (VIT D DEFICIENCY, FRACTURES): VITD: 21.31 ng/mL — ABNORMAL LOW (ref 30.00–100.00)

## 2019-11-01 LAB — HEMOGLOBIN A1C: Hgb A1c MFr Bld: 5.5 % (ref 4.6–6.5)

## 2019-11-10 DIAGNOSIS — S92309A Fracture of unspecified metatarsal bone(s), unspecified foot, initial encounter for closed fracture: Secondary | ICD-10-CM | POA: Diagnosis not present

## 2019-11-11 ENCOUNTER — Encounter: Payer: Self-pay | Admitting: Emergency Medicine

## 2019-11-21 ENCOUNTER — Telehealth: Payer: Self-pay | Admitting: Emergency Medicine

## 2019-11-21 DIAGNOSIS — N183 Chronic kidney disease, stage 3 unspecified: Secondary | ICD-10-CM

## 2019-11-21 DIAGNOSIS — E559 Vitamin D deficiency, unspecified: Secondary | ICD-10-CM

## 2019-11-21 MED ORDER — VITAMIN D (ERGOCALCIFEROL) 1.25 MG (50000 UNIT) PO CAPS: ORAL_CAPSULE | ORAL | 0 refills | Status: DC

## 2019-11-21 NOTE — Telephone Encounter (Signed)
Patient called in today for results. Advised of PCP recommendations. She has not been taking any OTC Vitamin D. She is agreeable with rx Vitamin D sent to the pharmacy. She was not taking her Atorvastatin daily but has been since refilling her medications.  She does not have an appointment with Cardiology yet.  Rx sent to the pharmacy.       Per Einar Pheasant results: Lab results are in. Overall things look good. Kidney function is improved from previous checks which is great. Vitamin D is low from her chronic kidney disease. Want her to make sure she is taking OTC Vitamin D3 1000 units daily. Also want to start her on Ergocalciferol 50,000 units once weekly x 10 weeks. Ok to send in Rx with 0 RF. Cholesterol is well above goal giving her chronic medical issues. I know she had not been taking her atorvastatin. Recommend she restart and follow-up with Dr. Oval Linsey as discussed at visit. Ok to send in a refill for her if needed until she see Cardiology.

## 2019-12-05 ENCOUNTER — Other Ambulatory Visit: Payer: Self-pay

## 2019-12-05 NOTE — Patient Outreach (Signed)
[facility]) Care Management  12/05/2019  Anel Davidson 04/06/1946 [id number]  Medication Adherence call to Mrs. [place] Compliant Voice message left with a call back number. Mrs. Ehrich is showing past due on Losartan 100 mg under [place].  [place] Management Direct Dial [phone]  Fax [phone] [email or web address]

## 2019-12-11 DIAGNOSIS — S92309A Fracture of unspecified metatarsal bone(s), unspecified foot, initial encounter for closed fracture: Secondary | ICD-10-CM | POA: Diagnosis not present

## 2020-01-03 ENCOUNTER — Other Ambulatory Visit: Payer: Self-pay

## 2020-01-03 NOTE — Patient Outreach (Signed)
[facility]) Care Management  01/03/2020  Sabela Pflieger 08-18-1946 [id number]   Medication Adherence call to Mrs. Jearlene Smuck spoke with patient she did not want to engage,patient ask to be removed from the list. Mrs. Carmenate is showing past due on Losartan 100 mg under [place].   [facility] Management Direct Dial [phone]  Fax [phone] [email or web address]

## 2020-01-31 ENCOUNTER — Telehealth: Payer: Self-pay | Admitting: Physician Assistant

## 2020-01-31 NOTE — Progress Notes (Signed)
  Chronic Care Management   Note  01/31/2020 Name: Katie [name] MRN: [id number] DOB: 1946-01-09  Katie [name] is a 74 y.o. year old female who is a primary care patient of Delorse Limber. I reached out to Lorrin Mais by phone today in response to a referral sent by [name]'s PCP, Brunetta Jeans, PA-C.   [name] was given information about Chronic Care Management services today including:  1. CCM service includes personalized support from designated clinical staff supervised by her physician, including individualized plan of care and coordination with other care providers 2. 24/7 contact phone numbers for assistance for urgent and routine care needs. 3. Service will only be billed when office clinical staff spend 20 minutes or more in a month to coordinate care. 4. Only one practitioner may furnish and bill the service in a calendar month. 5. The patient may stop CCM services at any time (effective at the end of the month) by phone call to the office staff.   Patient agreed to services and verbal consent obtained.   Follow up plan:   Earney [place] [facility] Scheduler

## 2020-02-02 ENCOUNTER — Telehealth: Payer: Self-pay | Admitting: Physician Assistant

## 2020-02-02 NOTE — Telephone Encounter (Signed)
Huge history of non-compliance. Was there a reason she stopped? Recommend we get her on schedule for video visit to discuss and reassess BP.

## 2020-02-02 NOTE — Telephone Encounter (Signed)
Patient medication list shows Amlodipine, Metoprolol and Losartan.

## 2020-02-02 NOTE — Telephone Encounter (Signed)
Amy a nurse from [facility] called in wanting to make [facility] aware that pt is no longer taking the Losartan. She hasn't taking that medication in over 34mths. The only medication she is taking for her BP is Metoprolol. For question you can reach out to Amy at [phone] Ref # [id number]

## 2020-02-03 NOTE — Telephone Encounter (Signed)
Called patient but unable to leave a message due to mailbox being full.

## 2020-02-06 ENCOUNTER — Telehealth: Payer: Self-pay | Admitting: Physician Assistant

## 2020-02-06 NOTE — Telephone Encounter (Signed)
Pt called in asking for a refill on the [place], pt uses [facility] on [address]

## 2020-02-07 ENCOUNTER — Other Ambulatory Visit: Payer: Self-pay | Admitting: Emergency Medicine

## 2020-02-07 DIAGNOSIS — J449 Chronic obstructive pulmonary disease, unspecified: Secondary | ICD-10-CM

## 2020-02-07 MED ORDER — DULERA 200-5 MCG/ACT IN AERO: 2.0000 | INHALATION_SPRAY | Freq: Two times a day (BID) | RESPIRATORY_TRACT | 3 refills | Status: DC

## 2020-02-07 NOTE — Telephone Encounter (Signed)
Spoke with patient and she states she stopped taking the Losartan, stating the dosage was too high. She didn't need all the medication. Sounds like she is just taking the Metoprolol bid, unsure about Amlodipine and Losartan.  Appointment scheduled for 02/08/20 with PCP.

## 2020-02-07 NOTE — Telephone Encounter (Signed)
Refilled mediation to the pharmacy

## 2020-02-08 ENCOUNTER — Ambulatory Visit (INDEPENDENT_AMBULATORY_CARE_PROVIDER_SITE_OTHER): Payer: Medicare Other | Admitting: Physician Assistant

## 2020-02-08 ENCOUNTER — Other Ambulatory Visit: Payer: Self-pay

## 2020-02-08 ENCOUNTER — Encounter: Payer: Self-pay | Admitting: Physician Assistant

## 2020-02-08 VITALS — BP 138/88 | HR 85 | Temp 97.6°F | Resp 16 | Ht 61.0 in | Wt 146.0 lb

## 2020-02-08 DIAGNOSIS — J449 Chronic obstructive pulmonary disease, unspecified: Secondary | ICD-10-CM | POA: Diagnosis not present

## 2020-02-08 DIAGNOSIS — N183 Chronic kidney disease, stage 3 unspecified: Secondary | ICD-10-CM | POA: Diagnosis not present

## 2020-02-08 DIAGNOSIS — Z720 Tobacco use: Secondary | ICD-10-CM

## 2020-02-08 DIAGNOSIS — I10 Essential (primary) hypertension: Secondary | ICD-10-CM

## 2020-02-08 DIAGNOSIS — H6523 Chronic serous otitis media, bilateral: Secondary | ICD-10-CM

## 2020-02-08 MED ORDER — TRIAMCINOLONE ACETONIDE 55 MCG/ACT NA AERO: 2.0000 | INHALATION_SPRAY | Freq: Every day | NASAL | 0 refills | Status: DC

## 2020-02-08 MED ORDER — CHANTIX STARTING MONTH PAK 0.5 MG X 11 & 1 MG X 42 PO TABS: ORAL_TABLET | ORAL | 0 refills | Status: DC

## 2020-02-08 MED ORDER — ALBUTEROL SULFATE HFA 108 (90 BASE) MCG/ACT IN AERS: 2.0000 | INHALATION_SPRAY | Freq: Four times a day (QID) | RESPIRATORY_TRACT | 5 refills | Status: AC | PRN

## 2020-02-08 MED ORDER — LOSARTAN POTASSIUM 50 MG PO TABS: 50.0000 mg | ORAL_TABLET | Freq: Every day | ORAL | 3 refills | Status: DC

## 2020-02-08 NOTE — Progress Notes (Signed)
Patient presents to clinic today to follow-up regarding blood pressure and COPD/tobacco use disorder.  Patient also with acute concerns today.  Patient with history of solitary kidney, CKD III, Chronic Diastolic HF with longstanding history of hypertension, currently prescribed a regimen of losartan 100 mg daily, metoprolol 25 mg twice daily, and amlodipine 5 mg daily.  Notes she has only been taking her metoprolol.  States she was not aware she was started on amlodipine.  Has prescription for losartan but has not been taking it she felt like she did not need. Patient denies chest pain, palpitations, lightheadedness, dizziness, vision changes or frequent headaches.  BP Readings from Last 3 Encounters:  02/08/20 138/88  10/31/19 (!) 138/98  05/16/19 117/86   Patient also with history of COPD with continued tobacco use.  Patient smoking 1 pack/day.  Has daily issue from chronic bronchitis including increased mucus production, chest tightness, wheeze and cough.  Denies any acute worsening of her symptoms but states her symptoms are not well controlled.  Has prescription for [facility] as a maintenance inhaler which she takes a couple times per week.  This is prescribed to be taken as follows: 2 puffs twice daily.  Has not been using rescue inhaler as she states she ran out.  Patient without current pulmonology follow-up as she has previously been nonadherent with this.  Denies fever, chills, change in mucus coloration or consistency.  Denies any leg swelling.  Patient also endorses a sensation of fullness in her ears bilaterally with right greater than left.  Notes occasional itching.  Denies overt ear pain.  Denies drainage from the ear.  Denies tinnitus.  Has noted slight decrease in hearing bilaterally.  Denies any noted loud noise exposure or other trauma.  Past Medical History:  Diagnosis Date  . AAA (abdominal aortic aneurysm) ([place])   . Arthritis   . Cholelithiasis 2011   s/p cholescystectomy     . Chronic diastolic congestive heart failure ([place])   . COPD (chronic obstructive pulmonary disease) ([place])   . Emphysema   . GERD (gastroesophageal reflux disease)   . History of heartburn   . History of renal cell carcinoma   . Hypercholesteremia   . Hypertension   . Hypertension   . Mitral regurgitation   . Pneumonia 1980's  . Renal cell carcinoma 01/2010   s/p right radical nephrectomy 01/2010,  followed by [facility]  . S/P minimally invasive mitral valve repair 04/20/2019   Complex valvuloplasty including triangular resection of flail segment of posterior leaflet, artificial Gore-tex neochord placement x6 and 30 mm Sorin Memo 4D ring annuloplasty via right mini thoracotomy approach  . Shortness of breath   . Tuberculosis    Tests positive for PPD. dad had h/o TB.    Current Outpatient Medications on File Prior to Visit  Medication Sig Dispense Refill  . albuterol (PROVENTIL) (2.5 MG/3ML) 0.083% nebulizer solution Take 3 mLs (2.5 mg total) by nebulization every 6 (six) hours as needed for wheezing or shortness of breath. 150 mL 3  . atorvastatin (LIPITOR) 40 MG tablet TAKE 1 TABLET BY MOUTH ONCE DAILY AT  6  PM 30 tablet 0  . B Complex-C-Folic Acid (SM B SUPER VITAMIN COMPLEX) TABS Take 1 tablet by mouth daily.    Marland Kitchen losartan (COZAAR) 100 MG tablet Take 1 tablet (100 mg total) by mouth daily. 30 tablet 3  . metoprolol tartrate (LOPRESSOR) 25 MG tablet Take 1 tablet (25 mg total) by mouth 2 (two) times daily. [place]  tablet 3  . Misc. Devices (PULSE OXIMETER FOR FINGER) MISC 1 Device by Does not apply route as needed. 1 each 0  . mometasone-formoterol (DULERA) 200-5 MCG/ACT AERO Inhale 2 puffs into the lungs 2 (two) times daily. 16 g 3  . amLODipine (NORVASC) 5 MG tablet Take 1 tablet (5 mg total) by mouth daily. (Patient not taking: Reported on 10/31/2019) 30 tablet 2  . aspirin EC 81 MG tablet Take 1 tablet (81 mg total) by mouth daily. (Patient not taking: Reported on 10/31/2019) 30  tablet 0  . furosemide (LASIX) 20 MG tablet Take 20 mg by mouth. Take 1 tablet as needed for swelling and/or Shortness of breath     No current facility-administered medications on file prior to visit.    Allergies  Allergen Reactions  . Ace Inhibitors Other (See Comments)    Pseudoasthma/ renal failure     Family History  Problem Relation Age of Onset  . COPD Mother        DECEASED/SMOKED  . Diabetes Brother   . Diabetes Brother     Social History   Socioeconomic History  . Marital status: Married    Spouse name: Not on file  . Number of children: 3  . Years of education: 29  . Highest education level: Not on file  Occupational History  . 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 Smoking status: Former Smoker    Packs/day: 0.50    Years: 53.00    Pack years: 26.50    Types: Cigarettes    Quit date: 03/18/2019    Years since quitting: 0.8  . Smokeless tobacco: Never Used  Substance and Sexual Activity  . Alcohol use: Yes    Alcohol/week: 0.0 standard drinks    Comment:  "drink very rarely"  . Drug use: No    Types: Cocaine    Comment: hx of cocaine last in 2006  . Sexual activity: Never  Other Topics Concern  . Not on file  Social History Narrative          Lives in [place] with her husband.    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    Patient has 3 grown children that do not live with her.    Patient does not have any medical insurance.   Social Determinants of Health   Financial Resource Strain:   . Difficulty of Paying Living Expenses:   Food Insecurity:   . Worried About Charity fundraiser in the Last Year:   . Arboriculturist in the Last Year:   Transportation Needs:   . Film/video editor (Medical):   Marland Kitchen Lack of Transportation (Non-Medical):   Physical Activity:   . Days of Exercise per Week:   . Minutes of Exercise per Session:    Stress:   . Feeling of Stress :   Social Connections:   . Frequency of Communication with Friends and Family:   . Frequency of Social Gatherings with Friends and Family:   . Attends Religious Services:   . Active Member of Clubs or Organizations:   . Attends Archivist Meetings:   Marland Kitchen Marital Status:    Review of Systems - See HPI.  All other ROS are negative.  BP 138/88   Pulse 85   Temp 97.6 F (36.4 C) (Temporal)   Resp 16   Ht 5'  1" (1.549 m)   Wt 146 lb (66.2 kg)   SpO2 98%   BMI 27.59 kg/m   Physical Exam Vitals reviewed.  Constitutional:      Appearance: Normal appearance.  HENT:     Head: Normocephalic and atraumatic.     Right Ear: A middle ear effusion (serous) is present. There is no impacted cerumen. Tympanic membrane is retracted.     Left Ear: A middle ear effusion (serous) is present. There is no impacted cerumen.  Eyes:     Conjunctiva/sclera: Conjunctivae normal.     Pupils: Pupils are equal, round, and reactive to light.  Cardiovascular:     Rate and Rhythm: Normal rate and regular rhythm.     Heart sounds: Normal heart sounds.  Pulmonary:     Effort: No respiratory distress.     Breath sounds: No stridor. Wheezing present. No rhonchi or rales.  Chest:     Chest wall: No tenderness.  Musculoskeletal:     Cervical back: Neck supple.  Neurological:     General: No focal deficit present.     Mental Status: She is alert and oriented to person, place, and time.  Psychiatric:        Mood and Affect: Mood normal.     Assessment/Plan: 1. Essential hypertension Discussed importance of compliance with medication.  Very important giving her history that she continue beta-blocker and ARB.  Giving blood pressure only slightly elevated today, will decrease losartan dose to 50 mg.  She promises to take daily as directed.  We will have her continue to follow DASH diet.  She is to check blood pressure at home and record daily.  Follow-up in 2 weeks in  office. - losartan (COZAAR) 50 MG tablet; Take 1 tablet (50 mg total) by mouth daily.  Dispense: 90 tablet; Refill: 3  2. Stage 3 chronic kidney disease, unspecified whether stage 3a or 3b CKD Patient to follow-up with nephrology as scheduled.  Discussed importance of her medications.  3. COPD Significant nonadherence to treatment regimen.  I have multiple prior discussions with patient about her symptoms and need to use maintenance inhaler.  She has also been encouraged to let us help her quit smoking multiple times, declining at past visits.  States she is willing to do what she needs to at present.  Referral back to pulmonology placed.  She has been again instructed to use her Dulera twice daily as directed so we can get a better idea of symptom control.  Rx Chantix starting pack as she has tolerated in the past but did not complete course.  Refill rescue inhaler given.  Follow-up scheduled. - Ambulatory referral to Pulmonology - albuterol (VENTOLIN HFA) 108 (90 Base) MCG/ACT inhaler; Inhale 2 puffs into the lungs every 6 (six) hours as needed for wheezing or shortness of breath.  Dispense: 8 g; Refill: 5 - varenicline (CHANTIX STARTING MONTH PAK) 0.5 MG X 11 & 1 MG X 42 tablet; Take one 0.5 mg tablet by mouth once daily for 3 days, then increase to one 0.5 mg tablet twice daily for 4 days, then increase to one 1 mg tablet twice daily.  Dispense: 53 tablet; Refill: 0  4. Tobacco abuse disorder Patient endorses readiness and willingness to quit.  After discussion of options she has elected to try Chantix again.  Was on previously and tolerated well without side effects but did not complete her treatment course.  Rx Chantix starting pack sent in.  Follow-up scheduled. - varenicline (  CHANTIX STARTING MONTH PAK) 0.5 MG X 11 & 1 MG X 42 tablet; Take one 0.5 mg tablet by mouth once daily for 3 days, then increase to one 0.5 mg tablet twice daily for 4 days, then increase to one 1 mg tablet twice daily.   Dispense: 53 tablet; Refill: 0  5. Bilateral chronic serous otitis media Likely cause of the  " ear pressure".  Start nasal steroid.  Increase fluids.  OTC antihistamine as directed.  ENT if not improving.  This visit occurred during the SARS-CoV-2 public health emergency.  Safety protocols were in place, including screening questions prior to the visit, additional usage of staff PPE, and extensive cleaning of exam room while observing appropriate contact time as indicated for disinfecting solutions.     [name], PA-C

## 2020-02-08 NOTE — Patient Instructions (Signed)
As discussed numerous times before, you really need to be consistent with your medications.  For your COPD, use the Dulera twice daily as directed every day.  This is a maintenance inhaler and helps you to keep the COPD under control.  The albuterol inhaler is to be used as directed only if needed for breakthrough symptoms.  He should not be having to use this multiple times per week.  You need to try to keep well-hydrated.  I do recommend a daily plain Mucinex to thin out the congestion you have from the COPD.  I have placed a referral back to pulmonology as you do need specialist follow-up.  I do see where you are scheduled for your CT lung cancer screen last year but did not attend appointment.  They will help to make sure this is updated.  For your blood pressure, we are stopping the amlodipine since you have not been taking.  I am going to have you continue the metoprolol, taking as directed.  I also want you to take the losartan as directed.  I have sent in a lower dose of medication for you (50 mg).  Take these as directed and try to follow a low-salt diet (see below).  Check your blood pressure a few times a week and record. I want you to bring these readings to a follow-up visit in 2 weeks.  Start the Nasacort nasal spray as directed.  This will help regulate your eustachian tubes which are causing your ear symptoms.  Please let me know if things are not improving.  Lastly, start the Chantix taking as directed.  It is very important that we continue working towards total smoking cessation to help prevent the progression of your chronic medical issues.  Again follow-up with me in 2 weeks regarding your blood pressure.

## 2020-02-10 ENCOUNTER — Other Ambulatory Visit: Payer: Self-pay | Admitting: General Practice

## 2020-02-10 DIAGNOSIS — E785 Hyperlipidemia, unspecified: Secondary | ICD-10-CM

## 2020-02-10 DIAGNOSIS — J441 Chronic obstructive pulmonary disease with (acute) exacerbation: Secondary | ICD-10-CM

## 2020-02-10 DIAGNOSIS — F418 Other specified anxiety disorders: Secondary | ICD-10-CM

## 2020-02-10 DIAGNOSIS — R739 Hyperglycemia, unspecified: Secondary | ICD-10-CM

## 2020-02-10 DIAGNOSIS — I10 Essential (primary) hypertension: Secondary | ICD-10-CM

## 2020-02-22 ENCOUNTER — Ambulatory Visit: Payer: Medicare Other | Admitting: Physician Assistant

## 2020-02-27 ENCOUNTER — Ambulatory Visit: Payer: Medicare Other | Admitting: Physician Assistant

## 2020-02-27 NOTE — Progress Notes (Signed)
error 

## 2020-02-27 NOTE — Patient Instructions (Signed)
error 

## 2020-02-28 ENCOUNTER — Institutional Professional Consult (permissible substitution): Payer: Medicare Other | Admitting: Internal Medicine

## 2020-02-28 ENCOUNTER — Ambulatory Visit: Payer: Medicare Other

## 2020-02-28 ENCOUNTER — Telehealth: Payer: Self-pay

## 2020-02-28 NOTE — Telephone Encounter (Signed)
  Chronic Care Management   Outreach Note  02/28/2020 Name: Mariluz Shkolnik MRN: [id number] DOB: [birth year]  Referred by: Brunetta Jeans, PA-C Reason for referral: Telephone Appointment with [facility] Pharmacist, Madelin Rear.    An unsuccessful telephone outreach was attempted today. The patient was referred to the pharmacist for assistance with care management and care coordination. If patient returns call, please provide number below to reschedule visit.   Madelin Rear, Pharm.D., BCGP Clinical Pharmacist [facility] [phone]

## 2020-02-29 ENCOUNTER — Telehealth: Payer: Self-pay | Admitting: Physician Assistant

## 2020-02-29 NOTE — Progress Notes (Signed)
  Chronic Care Management   Note  02/29/2020 Name: Simaya Kramer MRN: [id number] DOB: [birth year]  Flarence Exantus is a 74 y.o. year old female who is a primary care patient of Delorse Limber. I reached out to Lorrin Mais by phone today in response to a referral sent by Ms. Bennett Sunderland's PCP, Brunetta Jeans, PA-C.   Ms. Jacobowitz was given information about Chronic Care Management services today including:  1. CCM service includes personalized support from designated clinical staff supervised by her physician, including individualized plan of care and coordination with other care providers 2. 24/7 contact phone numbers for assistance for urgent and routine care needs. 3. Service will only be billed when office clinical staff spend 20 minutes or more in a month to coordinate care. 4. Only one practitioner may furnish and bill the service in a calendar month. 5. The patient may stop CCM services at any time (effective at the end of the month) by phone call to the office staff.   Patient agreed to services and verbal consent obtained.  This note is not being shared with the patient for the following reason: To respect privacy (The patient or proxy has requested that the information not be shared). Follow up plan:   Earney [place] Upstream Scheduler

## 2020-03-01 ENCOUNTER — Ambulatory Visit: Payer: Medicare Other | Admitting: Physician Assistant

## 2020-03-01 NOTE — Progress Notes (Signed)
°  Chronic Care Management   Note  03/01/2020 Name: Katie [name] MRN: [id number] DOB: 1946/04/30  Katie [name] is a 74 y.o. year old female who is a primary care patient of Delorse Limber. I reached out to Lorrin Mais by phone today in response to a referral sent by Katie [name]'s PCP, Brunetta Jeans, PA-C.   Katie [name] was given information about Chronic Care Management services today including:  1. CCM service includes personalized support from designated clinical staff supervised by her physician, including individualized plan of care and coordination with other care providers 2. 24/7 contact phone numbers for assistance for urgent and routine care needs. 3. Service will only be billed when office clinical staff spend 20 minutes or more in a month to coordinate care. 4. Only one practitioner may furnish and bill the service in a calendar month. 5. The patient may stop CCM services at any time (effective at the end of the month) by phone call to the office staff.   Patient agreed to services and verbal consent obtained.   This note is not being shared with the patient for the following reason: To respect privacy (The patient or proxy has requested that the information not be shared).  Follow up plan:   Earney Hamburg Upstream Scheduler

## 2020-03-01 NOTE — Progress Notes (Signed)
  Chronic Care Management   Note  03/01/2020 Name: Katie [name] MRN: [id number] DOB: 1946/01/15  [name] is a 74 y.o. year old female who is a primary care patient of Delorse Limber. I reached out to Lorrin Mais by phone today in response to a referral sent by [name]'s PCP, Brunetta Jeans, PA-C.   [name] was given information about Chronic Care Management services today including:  1. CCM service includes personalized support from designated clinical staff supervised by her physician, including individualized plan of care and coordination with other care providers 2. 24/7 contact phone numbers for assistance for urgent and routine care needs. 3. Service will only be billed when office clinical staff spend 20 minutes or more in a month to coordinate care. 4. Only one practitioner may furnish and bill the service in a calendar month. 5. The patient may stop CCM services at any time (effective at the end of the month) by phone call to the office staff.   Patient agreed to services and verbal consent obtained.   This note is not being shared with the patient for the following reason: To respect privacy (The patient or proxy has requested that the information not be shared).  Follow up plan:   Earney Hamburg Upstream Scheduler

## 2020-03-15 ENCOUNTER — Ambulatory Visit: Payer: Self-pay | Admitting: Pharmacist

## 2020-03-21 ENCOUNTER — Telehealth: Payer: Self-pay | Admitting: Physician Assistant

## 2020-03-21 NOTE — Telephone Encounter (Signed)
Left message for patient to schedule Annual Wellness Visit.  Please schedule with Nurse Health Advisor Martha Stanley, RN at [facility]  

## 2020-04-05 ENCOUNTER — Other Ambulatory Visit: Payer: Self-pay

## 2020-04-05 ENCOUNTER — Telehealth: Payer: Medicare Other

## 2020-04-23 ENCOUNTER — Emergency Department (HOSPITAL_COMMUNITY): Payer: Medicare Other

## 2020-04-23 ENCOUNTER — Inpatient Hospital Stay (HOSPITAL_COMMUNITY)
Admission: EM | Admit: 2020-04-23 | Discharge: 2020-04-26 | DRG: 190 | Disposition: A | Discharge status: Home / Self Care | Payer: Medicare Other | Attending: Internal Medicine | Admitting: Internal Medicine

## 2020-04-23 ENCOUNTER — Encounter (HOSPITAL_COMMUNITY): Payer: Self-pay

## 2020-04-23 DIAGNOSIS — N1832 Chronic kidney disease, stage 3b: Secondary | ICD-10-CM | POA: Diagnosis not present

## 2020-04-23 DIAGNOSIS — I10 Essential (primary) hypertension: Secondary | ICD-10-CM | POA: Diagnosis not present

## 2020-04-23 DIAGNOSIS — I5033 Acute on chronic diastolic (congestive) heart failure: Secondary | ICD-10-CM | POA: Diagnosis present

## 2020-04-23 DIAGNOSIS — Z825 Family history of asthma and other chronic lower respiratory diseases: Secondary | ICD-10-CM

## 2020-04-23 DIAGNOSIS — Z888 Allergy status to other drugs, medicaments and biological substances status: Secondary | ICD-10-CM

## 2020-04-23 DIAGNOSIS — I5032 Chronic diastolic (congestive) heart failure: Secondary | ICD-10-CM | POA: Diagnosis not present

## 2020-04-23 DIAGNOSIS — Z7982 Long term (current) use of aspirin: Secondary | ICD-10-CM | POA: Diagnosis not present

## 2020-04-23 DIAGNOSIS — Z9889 Other specified postprocedural states: Secondary | ICD-10-CM

## 2020-04-23 DIAGNOSIS — N1831 Chronic kidney disease, stage 3a: Secondary | ICD-10-CM | POA: Diagnosis not present

## 2020-04-23 DIAGNOSIS — J441 Chronic obstructive pulmonary disease with (acute) exacerbation: Secondary | ICD-10-CM | POA: Diagnosis not present

## 2020-04-23 DIAGNOSIS — M1990 Unspecified osteoarthritis, unspecified site: Secondary | ICD-10-CM | POA: Diagnosis present

## 2020-04-23 DIAGNOSIS — Z79899 Other long term (current) drug therapy: Secondary | ICD-10-CM

## 2020-04-23 DIAGNOSIS — I342 Nonrheumatic mitral (valve) stenosis: Secondary | ICD-10-CM | POA: Diagnosis not present

## 2020-04-23 DIAGNOSIS — B342 Coronavirus infection, unspecified: Secondary | ICD-10-CM | POA: Diagnosis present

## 2020-04-23 DIAGNOSIS — J9601 Acute respiratory failure with hypoxia: Secondary | ICD-10-CM | POA: Diagnosis present

## 2020-04-23 DIAGNOSIS — Z20822 Contact with and (suspected) exposure to covid-19: Secondary | ICD-10-CM | POA: Diagnosis not present

## 2020-04-23 DIAGNOSIS — J9811 Atelectasis: Secondary | ICD-10-CM | POA: Diagnosis not present

## 2020-04-23 DIAGNOSIS — I340 Nonrheumatic mitral (valve) insufficiency: Secondary | ICD-10-CM | POA: Diagnosis not present

## 2020-04-23 DIAGNOSIS — I714 Abdominal aortic aneurysm, without rupture: Secondary | ICD-10-CM | POA: Diagnosis not present

## 2020-04-23 DIAGNOSIS — E7800 Pure hypercholesterolemia, unspecified: Secondary | ICD-10-CM | POA: Diagnosis present

## 2020-04-23 DIAGNOSIS — Z952 Presence of prosthetic heart valve: Secondary | ICD-10-CM

## 2020-04-23 DIAGNOSIS — R0602 Shortness of breath: Secondary | ICD-10-CM | POA: Diagnosis not present

## 2020-04-23 DIAGNOSIS — K219 Gastro-esophageal reflux disease without esophagitis: Secondary | ICD-10-CM | POA: Diagnosis not present

## 2020-04-23 DIAGNOSIS — Z85528 Personal history of other malignant neoplasm of kidney: Secondary | ICD-10-CM

## 2020-04-23 DIAGNOSIS — Z905 Acquired absence of kidney: Secondary | ICD-10-CM | POA: Diagnosis not present

## 2020-04-23 DIAGNOSIS — I130 Hypertensive heart and chronic kidney disease with heart failure and stage 1 through stage 4 chronic kidney disease, or unspecified chronic kidney disease: Secondary | ICD-10-CM | POA: Diagnosis not present

## 2020-04-23 DIAGNOSIS — F17210 Nicotine dependence, cigarettes, uncomplicated: Secondary | ICD-10-CM | POA: Diagnosis present

## 2020-04-23 DIAGNOSIS — N183 Chronic kidney disease, stage 3 unspecified: Secondary | ICD-10-CM | POA: Diagnosis present

## 2020-04-23 DIAGNOSIS — J432 Centrilobular emphysema: Secondary | ICD-10-CM | POA: Diagnosis not present

## 2020-04-23 DIAGNOSIS — Z8701 Personal history of pneumonia (recurrent): Secondary | ICD-10-CM

## 2020-04-23 DIAGNOSIS — M810 Age-related osteoporosis without current pathological fracture: Secondary | ICD-10-CM | POA: Diagnosis not present

## 2020-04-23 LAB — BASIC METABOLIC PANEL
Anion gap: 10 (ref 5–15)
BUN: 14 mg/dL (ref 8–23)
CO2: 28 mmol/L (ref 22–32)
Calcium: 9.3 mg/dL (ref 8.9–10.3)
Chloride: 102 mmol/L (ref 98–111)
Creatinine, Ser: 1.55 mg/dL — ABNORMAL HIGH (ref 0.44–1.00)
GFR calc Af Amer: 38 mL/min — ABNORMAL LOW (ref >60)
GFR calc non Af Amer: 33 mL/min — ABNORMAL LOW (ref >60)
Glucose, Bld: 122 mg/dL — ABNORMAL HIGH (ref 70–99)
Potassium: 4 mmol/L (ref 3.5–5.1)
Sodium: 140 mmol/L (ref 135–145)

## 2020-04-23 LAB — I-STAT VENOUS BLOOD GAS, ED
Acid-Base Excess: 5 mmol/L — ABNORMAL HIGH (ref 0.0–2.0)
Bicarbonate: 31.2 mmol/L — ABNORMAL HIGH (ref 20.0–28.0)
Calcium, Ion: 1.23 mmol/L (ref 1.15–1.40)
HCT: 38 % (ref 36.0–46.0)
Hemoglobin: 12.9 g/dL (ref 12.0–15.0)
O2 Saturation: 72 %
Potassium: 4.1 mmol/L (ref 3.5–5.1)
Sodium: 142 mmol/L (ref 135–145)
TCO2: 33 mmol/L — ABNORMAL HIGH (ref 22–32)
pCO2, Ven: 51.8 mmHg (ref 44.0–60.0)
pH, Ven: 7.388 (ref 7.250–7.430)
pO2, Ven: 39 mmHg (ref 32.0–45.0)

## 2020-04-23 LAB — CBC
HCT: 38.5 % (ref 36.0–46.0)
Hemoglobin: 12.1 g/dL (ref 12.0–15.0)
MCH: 29.3 pg (ref 26.0–34.0)
MCHC: 31.4 g/dL (ref 30.0–36.0)
MCV: 93.2 fL (ref 80.0–100.0)
Platelets: 250 10*3/uL (ref 150–400)
RBC: 4.13 MIL/uL (ref 3.87–5.11)
RDW: 12.3 % (ref 11.5–15.5)
WBC: 5.4 10*3/uL (ref 4.0–10.5)
nRBC: 0 % (ref 0.0–0.2)

## 2020-04-23 LAB — SARS CORONAVIRUS 2 BY RT PCR (HOSPITAL ORDER, PERFORMED IN ~~LOC~~ HOSPITAL LAB): SARS Coronavirus 2: NEGATIVE

## 2020-04-23 LAB — TROPONIN I (HIGH SENSITIVITY)
Troponin I (High Sensitivity): 13 ng/L (ref <18)
Troponin I (High Sensitivity): 11 ng/L (ref <18)

## 2020-04-23 LAB — BRAIN NATRIURETIC PEPTIDE: B Natriuretic Peptide: 432.2 pg/mL — ABNORMAL HIGH (ref 0.0–100.0)

## 2020-04-23 MED ORDER — ALBUTEROL (5 MG/ML) CONTINUOUS INHALATION SOLN
10.0000 mg/h | INHALATION_SOLUTION | Freq: Once | RESPIRATORY_TRACT | Status: AC
  Administered 2020-04-23: 10 mg/h via RESPIRATORY_TRACT

## 2020-04-23 MED ORDER — GUAIFENESIN ER 600 MG PO TB12
600.0000 mg | ORAL_TABLET | Freq: Two times a day (BID) | ORAL | Status: DC
  Administered 2020-04-23 – 2020-04-25 (×4): 600 mg via ORAL
  Filled 2020-04-23 (×4): qty 1

## 2020-04-23 MED ORDER — METHYLPREDNISOLONE SODIUM SUCC 125 MG IJ SOLR
125.0000 mg | Freq: Once | INTRAMUSCULAR | Status: AC
  Administered 2020-04-23: 125 mg via INTRAVENOUS
  Filled 2020-04-23: qty 2

## 2020-04-23 MED ORDER — IPRATROPIUM BROMIDE 0.02 % IN SOLN
0.5000 mg | RESPIRATORY_TRACT | Status: DC
  Administered 2020-04-23: 0.5 mg via RESPIRATORY_TRACT
  Filled 2020-04-23: qty 2.5

## 2020-04-23 MED ORDER — SODIUM CHLORIDE 0.9 % IV SOLN
500.0000 mg | INTRAVENOUS | Status: DC
  Administered 2020-04-24 – 2020-04-25 (×3): 500 mg via INTRAVENOUS
  Filled 2020-04-23 (×4): qty 500

## 2020-04-23 MED ORDER — ATORVASTATIN CALCIUM 40 MG PO TABS
40.0000 mg | ORAL_TABLET | Freq: Every day | ORAL | Status: DC
  Administered 2020-04-24 – 2020-04-26 (×3): 40 mg via ORAL
  Filled 2020-04-23 (×3): qty 1

## 2020-04-23 MED ORDER — ALBUTEROL SULFATE (2.5 MG/3ML) 0.083% IN NEBU
5.0000 mg | INHALATION_SOLUTION | Freq: Once | RESPIRATORY_TRACT | Status: AC
  Administered 2020-04-23: 5 mg via RESPIRATORY_TRACT
  Filled 2020-04-23: qty 6

## 2020-04-23 MED ORDER — LOSARTAN POTASSIUM 50 MG PO TABS
50.0000 mg | ORAL_TABLET | Freq: Every day | ORAL | Status: DC
  Administered 2020-04-24 – 2020-04-26 (×3): 50 mg via ORAL
  Filled 2020-04-23 (×3): qty 1

## 2020-04-23 MED ORDER — ENOXAPARIN SODIUM 40 MG/0.4ML ~~LOC~~ SOLN
40.0000 mg | SUBCUTANEOUS | Status: DC
  Administered 2020-04-23 – 2020-04-25 (×3): 40 mg via SUBCUTANEOUS
  Filled 2020-04-23 (×3): qty 0.4

## 2020-04-23 MED ORDER — BUDESONIDE 0.25 MG/2ML IN SUSP
0.2500 mg | Freq: Two times a day (BID) | RESPIRATORY_TRACT | Status: DC
  Administered 2020-04-24 – 2020-04-26 (×5): 0.25 mg via RESPIRATORY_TRACT
  Filled 2020-04-23 (×5): qty 2

## 2020-04-23 MED ORDER — ONDANSETRON HCL 4 MG PO TABS: 4.0000 mg | ORAL_TABLET | Freq: Four times a day (QID) | ORAL | Status: DC | PRN

## 2020-04-23 MED ORDER — ONDANSETRON HCL 4 MG/2ML IJ SOLN: 4.0000 mg | Freq: Four times a day (QID) | INTRAMUSCULAR | Status: DC | PRN

## 2020-04-23 MED ORDER — HYDRALAZINE HCL 20 MG/ML IJ SOLN: 10.0000 mg | Freq: Once | INTRAMUSCULAR | Status: DC

## 2020-04-23 MED ORDER — METOPROLOL TARTRATE 25 MG PO TABS
25.0000 mg | ORAL_TABLET | Freq: Two times a day (BID) | ORAL | Status: DC
  Administered 2020-04-23 – 2020-04-26 (×6): 25 mg via ORAL
  Filled 2020-04-23 (×6): qty 1

## 2020-04-23 MED ORDER — HYDRALAZINE HCL 20 MG/ML IJ SOLN
10.0000 mg | INTRAMUSCULAR | Status: DC | PRN
  Administered 2020-04-26: 10 mg via INTRAVENOUS
  Filled 2020-04-23: qty 1

## 2020-04-23 MED ORDER — ALBUTEROL SULFATE (2.5 MG/3ML) 0.083% IN NEBU
2.5000 mg | INHALATION_SOLUTION | RESPIRATORY_TRACT | Status: DC
  Administered 2020-04-23: 2.5 mg via RESPIRATORY_TRACT
  Filled 2020-04-23: qty 3

## 2020-04-23 MED ORDER — IPRATROPIUM-ALBUTEROL 0.5-2.5 (3) MG/3ML IN SOLN
3.0000 mL | Freq: Once | RESPIRATORY_TRACT | Status: AC
  Administered 2020-04-23: 3 mL via RESPIRATORY_TRACT
  Filled 2020-04-23: qty 3

## 2020-04-23 MED ORDER — IPRATROPIUM-ALBUTEROL 0.5-2.5 (3) MG/3ML IN SOLN
3.0000 mL | Freq: Four times a day (QID) | RESPIRATORY_TRACT | Status: DC
  Administered 2020-04-24 – 2020-04-26 (×9): 3 mL via RESPIRATORY_TRACT
  Filled 2020-04-23 (×10): qty 3

## 2020-04-23 MED ORDER — METHYLPREDNISOLONE SODIUM SUCC 40 MG IJ SOLR
40.0000 mg | Freq: Two times a day (BID) | INTRAMUSCULAR | Status: DC
  Administered 2020-04-24 – 2020-04-26 (×5): 40 mg via INTRAVENOUS
  Filled 2020-04-23 (×6): qty 1

## 2020-04-23 MED ORDER — ALBUTEROL SULFATE (2.5 MG/3ML) 0.083% IN NEBU
2.5000 mg | INHALATION_SOLUTION | RESPIRATORY_TRACT | Status: DC | PRN
  Administered 2020-04-26: 2.5 mg via RESPIRATORY_TRACT
  Filled 2020-04-23: qty 3

## 2020-04-23 MED ORDER — ALBUTEROL SULFATE (2.5 MG/3ML) 0.083% IN NEBU
INHALATION_SOLUTION | RESPIRATORY_TRACT | Status: AC
  Filled 2020-04-23: qty 3

## 2020-04-23 NOTE — ED Provider Notes (Signed)
Katie [name]   CSN: [id number] Arrival date & time: 04/23/20  1353     History Chief Complaint  Patient presents with  . COPD    Katie [name] is a 74 y.o. female.  The history is provided by the patient.  COPD This is a recurrent problem. The current episode started more than 2 days ago. The problem occurs constantly. The problem has been gradually worsening. Associated symptoms include chest pain, headaches and shortness of breath. Pertinent negatives include no abdominal pain. The symptoms are aggravated by coughing. Relieved by: Intermittint improvement w inhaler/nebulizer. The treatment provided mild relief.  Shortness of Breath Associated symptoms: chest pain, cough, headaches and wheezing   Associated symptoms: no abdominal pain, no fever, no rash, no sore throat and no vomiting   Associated symptoms comment:  Symptoms began Friday. Rhinorrhea/Congestion       Past Medical History:  Diagnosis Date  . AAA (abdominal aortic aneurysm) ([place])   . Arthritis   . Cholelithiasis 2011   s/p cholescystectomy   . Chronic diastolic congestive heart failure ([place])   . COPD (chronic obstructive pulmonary disease) ([place])   . Emphysema   . GERD (gastroesophageal reflux disease)   . History of heartburn   . History of renal cell carcinoma   . Hypercholesteremia   . Hypertension   . Hypertension   . Mitral regurgitation   . Pneumonia 1980's  . Renal cell carcinoma 01/2010   s/p right radical nephrectomy 01/2010,  followed by [facility]  . S/P minimally invasive mitral valve repair 04/20/2019   Complex valvuloplasty including triangular resection of flail segment of posterior leaflet, artificial Gore-tex neochord placement x6 and 30 mm Sorin Memo 4D ring annuloplasty via right mini thoracotomy approach  . Shortness of breath   . Tuberculosis    Tests positive for PPD. dad had h/o TB.    Patient Active Problem List    Diagnosis Date Noted  . Acute respiratory failure with hypoxia ([place]) 04/23/2020  . Atrial fibrillation ([place]) 04/26/2019  . S/P minimally invasive mitral valve repair 04/20/2019  . Chronic diastolic congestive heart failure ([place])   . Pneumonia 04/04/2019  . Tobacco abuse 03/13/2019  . COPD with acute exacerbation ([place]) 03/13/2019  . Acute respiratory failure with hypoxia and hypercapnia ([place]) 03/13/2019  . COPD exacerbation ([place]) 03/13/2019  . Fx metatarsal 03/07/2019  . CKD (chronic kidney disease) stage 3, GFR 30-59 ml/min 09/29/2018  . Urinary urgency 06/11/2018  . Visit for preventive health examination 04/30/2017  . Osteoporosis 12/31/2016  . Arthralgia of both hands 11/11/2016  . Breast cancer screening 11/11/2016  . Chest pain 04/17/2016  . Hyperglycemia 08/03/2015  . Esophageal reflux   . Depression with anxiety   . Tobacco abuse disorder 06/27/2014  . Insomnia 07/15/2013  . Severe mitral regurgitation 09/29/2011  . COPD 09/26/2011  . Essential hypertension 09/26/2011  . HLD (hyperlipidemia) 09/26/2011  . S/P cholecystectomy 09/26/2011    Past Surgical History:  Procedure Laterality Date  . APPENDECTOMY  1970's  . BUBBLE STUDY  03/18/2019   Procedure: BUBBLE STUDY;  Surgeon: Fay Records, MD;  Location: [facility];  Service: Cardiovascular;;  . CARDIAC CATHETERIZATION  09/2011  . CHOLECYSTECTOMY  01/2010  . DIAGNOSTIC LAPAROSCOPY    . KIDNEY SURGERY    . LEFT AND RIGHT HEART CATHETERIZATION WITH CORONARY ANGIOGRAM N/A 09/30/2011   Procedure: LEFT AND RIGHT HEART CATHETERIZATION WITH CORONARY ANGIOGRAM;  Surgeon: Candee Furbish, MD;  Location: [facility] CATH  LAB;  Service: Cardiovascular;  Laterality: N/A;  . MITRAL VALVE REPAIR Right 04/20/2019   Procedure: MINIMALLY INVASIVE MITRAL VALVE REPAIR (MVR) USING MEMO 4D SIZE 30;  Surgeon: Rexene Alberts, MD;  Location: [facility];  Service: Open Heart Surgery;  Laterality: Right;  . MULTIPLE EXTRACTIONS WITH ALVEOLOPLASTY  10/06/2011    Procedure: MULTIPLE EXTRACION WITH ALVEOLOPLASTY;  Surgeon: Lenn Cal, DDS;  Location: [facility];  Service: Oral Surgery;  Laterality: N/A;  Multiple extraction of tooth #'s 1, 6, 8, 10, 11, 22, 23, 26, 27, 28, 29 with alveoloplasty and Upper right buccal exostoses reductions.  . NEPHRECTOMY RADICAL  01/2010   right   . OVARIAN CYST REMOVAL  1970's   "went through belly button"  . RIGHT/LEFT HEART CATH AND CORONARY ANGIOGRAPHY N/A 03/22/2019   Procedure: RIGHT/LEFT HEART CATH AND CORONARY ANGIOGRAPHY;  Surgeon: [name], MD;  Location: [facility] CV LAB;  Service: Cardiovascular;  Laterality: N/A;  . TEE WITHOUT CARDIOVERSION  10/01/2011   Procedure: TRANSESOPHAGEAL ECHOCARDIOGRAM (TEE);  Surgeon: [name];  Location: [facility];  Service: Cardiovascular;  Laterality: N/A;  . TEE WITHOUT CARDIOVERSION N/A 03/18/2019   Procedure: TRANSESOPHAGEAL ECHOCARDIOGRAM (TEE);  Surgeon: Fay Records, MD;  Location: [facility];  Service: Cardiovascular;  Laterality: N/A;  . TEE WITHOUT CARDIOVERSION N/A 04/20/2019   Procedure: TRANSESOPHAGEAL ECHOCARDIOGRAM (TEE);  Surgeon: Rexene Alberts, MD;  Location: [facility];  Service: Open Heart Surgery;  Laterality: N/A;  . TUBAL LIGATION  1970's  . US ECHOCARDIOGRAPHY  09/2011     OB History   No obstetric history on file.     Family History  Problem Relation Age of Onset  . COPD Mother        DECEASED/SMOKED  . Diabetes Brother   . Diabetes Brother     Social History   Tobacco Use  . Smoking status: Current Every Day Smoker    Packs/day: 1.00    Years: 53.00    Pack years: 53.00    Types: Cigarettes    Last attempt to quit: 03/18/2019    Years since quitting: 1.1  . Smokeless tobacco: Never Used  Vaping Use  . Vaping Use: Never used  Substance Use Topics  . Alcohol use: Yes    Alcohol/week: 0.0 standard drinks    Comment:  "drink very rarely"  . Drug use: Not Currently    Types: Cocaine    Comment: hx of  cocaine last in 2006    Home Medications Prior to Admission medications   Medication Sig Start Date End Date Taking? Authorizing Provider  acetaminophen (TYLENOL) 500 MG tablet Take 1,000 mg by mouth every 6 (six) hours as needed for headache (pain).   Yes [provider]  albuterol (PROVENTIL) (2.5 MG/3ML) 0.083% nebulizer solution Take 3 mLs (2.5 mg total) by nebulization every 6 (six) hours as needed for wheezing or shortness of breath. 03/30/19  Yes Brunetta Jeans, PA-C  albuterol (VENTOLIN HFA) 108 (90 Base) MCG/ACT inhaler Inhale 2 puffs into the lungs every 6 (six) hours as needed for wheezing or shortness of breath. 02/08/20  Yes Brunetta Jeans, PA-C  atorvastatin (LIPITOR) 40 MG tablet TAKE 1 TABLET BY MOUTH ONCE DAILY AT  6  PM Patient taking differently: Take 40 mg by mouth daily.  07/18/19  Yes Brunetta Jeans, PA-C  losartan (COZAAR) 50 MG tablet Take 1 tablet (50 mg total) by mouth daily. 02/08/20  Yes Brunetta Jeans, PA-C  metoprolol tartrate (  LOPRESSOR) 25 MG tablet Take 1 tablet (25 mg total) by mouth 2 (two) times daily. 10/31/19  Yes Brunetta Jeans, PA-C  mometasone-formoterol (DULERA) 200-5 MCG/ACT AERO Inhale 2 puffs into the lungs 2 (two) times daily. 02/07/20  Yes Brunetta Jeans, PA-C  Multiple Vitamin (MULTIVITAMIN WITH MINERALS) TABS tablet Take 1 tablet by mouth daily.   Yes [provider]  triamcinolone (NASACORT) 55 MCG/ACT AERO nasal inhaler Place 2 sprays into the nose daily. Patient taking differently: Place 2 sprays into the nose daily as needed (congestion).  02/08/20  Yes Brunetta Jeans, PA-C  aspirin EC 81 MG tablet Take 1 tablet (81 mg total) by mouth daily. Patient not taking: Reported on 10/31/2019 09/29/18   Bonnielee Haff, MD  Misc. Devices (PULSE OXIMETER FOR FINGER) MISC 1 Device by Does not apply route as needed. 03/11/19   Brunetta Jeans, PA-C    Allergies    Ace inhibitors  Review of Systems   Review of Systems   Constitutional: Negative for chills and fever.  HENT: Positive for congestion and rhinorrhea. Negative for sore throat.   Eyes: Positive for visual disturbance.  Respiratory: Positive for cough, shortness of breath and wheezing.   Cardiovascular: Positive for chest pain.  Gastrointestinal: Negative for abdominal pain, blood in stool and vomiting.  Genitourinary: Negative for dysuria and hematuria.  Skin: Negative for rash and wound.  Neurological: Positive for headaches.  Psychiatric/Behavioral: Negative.     Physical Exam Updated Vital Signs BP (!) 143/83 (BP Location: Right Arm)   Pulse (!) 107   Temp 98.7 F (37.1 C) (Oral)   Resp (!) 24   Ht 5\' 1"  (1.549 m)   Wt 65.6 kg   SpO2 99%   BMI 27.33 kg/m   Physical Exam Vitals and nursing [name] reviewed.  Constitutional:      Appearance: She is normal weight. She is not ill-appearing, toxic-appearing or diaphoretic.  HENT:     Head: Normocephalic and atraumatic.     Mouth/Throat:     Mouth: Mucous membranes are moist.  Eyes:     Extraocular Movements: Extraocular movements intact.     Conjunctiva/sclera: Conjunctivae normal.  Cardiovascular:     Rate and Rhythm: Normal rate and regular rhythm.  Pulmonary:     Effort: Respiratory distress present.     Breath sounds: Wheezing present. No rhonchi.  Abdominal:     General: There is no distension.     Palpations: Abdomen is soft.     Tenderness: There is no abdominal tenderness. There is no guarding or rebound.  Musculoskeletal:        General: No tenderness.     Right lower leg: No edema.     Left lower leg: No edema.  Skin:    General: Skin is warm.     Findings: No rash.  Neurological:     Mental Status: She is alert and oriented to person, place, and time. Mental status is at baseline.  Psychiatric:        Mood and Affect: Mood normal.        Behavior: Behavior normal.     ED Results / Procedures / Treatments   Labs (all labs ordered are listed, but only  abnormal results are displayed) Labs Reviewed  BASIC METABOLIC PANEL - Abnormal; Notable for the following components:      Result Value   Glucose, Bld 122 (*)    Creatinine, Ser 1.55 (*)    GFR calc non Af Wyvonnia Lora  33 (*)    GFR calc Af Amer 38 (*)    All other components within normal limits  BRAIN NATRIURETIC PEPTIDE - Abnormal; Notable for the following components:   B Natriuretic Peptide 432.2 (*)    All other components within normal limits  I-STAT VENOUS BLOOD GAS, ED - Abnormal; Notable for the following components:   Bicarbonate 31.2 (*)    TCO2 33 (*)    Acid-Base Excess 5.0 (*)    All other components within normal limits  SARS CORONAVIRUS 2 BY RT PCR (HOSPITAL ORDER, [facility])  CBC  BASIC METABOLIC PANEL  CBC  TROPONIN I (HIGH SENSITIVITY)  TROPONIN I (HIGH SENSITIVITY)    EKG None  Radiology DG Chest Portable 1 View  Result Date: 04/23/2020 CLINICAL DATA:  Shortness of breath. EXAM: PORTABLE CHEST 1 VIEW COMPARISON:  None. FINDINGS: Very mild atelectasis is seen within the bilateral lung bases. There is no evidence of a pleural effusion or pneumothorax. The heart size and mediastinal contours are within normal limits. An artificial cardiac valve is noted. The visualized skeletal structures are unremarkable. Radiopaque surgical clips are seen in the right upper quadrant. IMPRESSION: Very mild bibasilar atelectasis. Electronically Signed   By: Virgina Norfolk M.D.   On: 04/23/2020 18:02    Procedures Procedures (including critical care time)  Medications Ordered in ED Medications  albuterol (PROVENTIL) (2.5 MG/3ML) 0.083% nebulizer solution (  Not Given 04/23/20 1815)  atorvastatin (LIPITOR) tablet 40 mg (has no administration in time range)  losartan (COZAAR) tablet 50 mg (has no administration in time range)  metoprolol tartrate (LOPRESSOR) tablet 25 mg (25 mg Oral Given 04/23/20 2257)  ondansetron (ZOFRAN) tablet 4 mg (has no  administration in time range)    Or  ondansetron (ZOFRAN) injection 4 mg (has no administration in time range)  enoxaparin (LOVENOX) injection 40 mg (40 mg Subcutaneous Given 04/23/20 2256)  albuterol (PROVENTIL) (2.5 MG/3ML) 0.083% nebulizer solution 2.5 mg (has no administration in time range)  guaiFENesin (MUCINEX) 12 hr tablet 600 mg (600 mg Oral Given 04/23/20 2257)  budesonide (PULMICORT) nebulizer solution 0.25 mg (0.25 mg Nebulization Not Given 04/23/20 2253)  methylPREDNISolone sodium succinate (SOLU-MEDROL) 40 mg/mL injection 40 mg (has no administration in time range)  hydrALAZINE (APRESOLINE) injection 10 mg (has no administration in time range)  azithromycin (ZITHROMAX) 500 mg in sodium chloride 0.9 % 250 mL IVPB (500 mg Intravenous New Bag/Given 04/24/20 0009)  ipratropium-albuterol (DUONEB) 0.5-2.5 (3) MG/3ML nebulizer solution 3 mL (has no administration in time range)  albuterol (PROVENTIL) (2.5 MG/3ML) 0.083% nebulizer solution 5 mg (5 mg Nebulization Given 04/23/20 1726)  albuterol (PROVENTIL,VENTOLIN) solution continuous neb (10 mg/hr Nebulization Given 04/23/20 1814)  methylPREDNISolone sodium succinate (SOLU-MEDROL) 125 mg/2 mL injection 125 mg (125 mg Intravenous Given 04/23/20 1950)  ipratropium-albuterol (DUONEB) 0.5-2.5 (3) MG/3ML nebulizer solution 3 mL (3 mLs Nebulization Given 04/23/20 2140)    ED Course  I have reviewed the triage vital signs and the nursing notes.  Pertinent labs & imaging results that were available during my care of the patient were reviewed by me and considered in my medical decision making (see chart for details).    MDM Rules/Calculators/A&P                           Medical Decision Making: Sophira Rumler is a 74 y.o. female who presented to the ED today with SOB and wheezing worsening since Friday. Associated  with rhinorrhea, congestion, low grade fever that began Friday. Afebrile today. On arrival pt endroses HA, blurry vision, sternal chest pain.  Patient states she tried taking inhalers/nebulizers with only mild improvement in symptoms.   Past medical history includes COPD, GERD, HLD, HTN, RCC s/p nephrectomy. Shortness of breath is present at rest. On arrival pt afebrile, hypertensive to SBP 200s, SpO2 100% on rm air. On exam pt with decreased air movement and expiratory wheezing in all lung fields. Abdomen soft, non-distended, non-tender. No LEE bilaterally. Albuterol administered with RT. VBG significant for pH 7.388, PCO2 51.8, pO2 39, Bicarb 31.2. Obstructive lung disease exacerbation seems most likely w expiratory wheezing and decreased air movement on exam. ACS considered however no acute ischemic changes on EKG, no chest pain verbalized in history that sounds typical for ACS. Serial trop 13,11, CXR wo acute cardiopulmonary findings. Infectious pneumonia secondary to bacterial versus viral URI could be etiology of symptoms in setting of reported rhinorrhea, congestion, subjective fever. CXR wo consolidation concerning for pna. No leukocytosis on CBC. COVID neg. BNP 432 however does not appear elevated from prior, CXR wo evidence of pulmonary edema/volume overload.   Upon reassessing patient, patient with persistent wheezing on exam, will place patient on continuous and give IV solumedrol 125 mg.  On further reassessment patient still with persistent wheezing and decreased air movement in all lung fields. Based on the above findings, I believe patient requires admission for continued management of COPD exacerbation. Patient discussed with hospitalist on call, patient admitted.    Final Clinical Impression(s) / ED Diagnoses Final diagnoses:  COPD exacerbation [facility])    Rx / DC Orders ED Discharge Orders    None       Kennyth Lose, MD 04/24/20 0015    Margette Fast, MD 04/24/20 1152    Long, Wonda [facility], MD 04/24/20 1153

## 2020-04-23 NOTE — H&P (Signed)
History and Physical    Katie [name] JJO:[id number] DOB: [birth year] DOA: 04/23/2020  PCP: Brunetta Jeans, PA-C  Patient coming from: Home.  Chief Complaint: Shortness of breath.  HPI: Katie [name] is a 74 y.o. female with known history of COPD with ongoing tobacco abuse, severe mitral regurgitation status post mitral valve repair last year in May 2020, chronic kidney disease stage III with history of right-sided nephrectomy for renal cell carcinoma, hypertension presents to the ER because of worsening shortness of breath over the last 4 days with some productive cough denies any fever chills or chest pain.  ED Course: In the ER patient was found to be wheezing despite nebulizer treatment.  Chest x-ray does not show any acute EKG is unremarkable.  Labs are remarkable for creatinine of 1.5 which is at baseline BNP of 432 high sensitive troponins were negative Covid test was negative.  Patient admitted for COPD exacerbation.  Review of Systems: As per HPI, rest all negative.   Past Medical History:  Diagnosis Date  . AAA (abdominal aortic aneurysm) ([place])   . Arthritis   . Cholelithiasis 2011   s/p cholescystectomy   . Chronic diastolic congestive heart failure ([place])   . COPD (chronic obstructive pulmonary disease) ([place])   . Emphysema   . GERD (gastroesophageal reflux disease)   . History of heartburn   . History of renal cell carcinoma   . Hypercholesteremia   . Hypertension   . Hypertension   . Mitral regurgitation   . Pneumonia 1980's  . Renal cell carcinoma 01/2010   s/p right radical nephrectomy 01/2010,  followed by [facility]  . S/P minimally invasive mitral valve repair 04/20/2019   Complex valvuloplasty including triangular resection of flail segment of posterior leaflet, artificial Gore-tex neochord placement x6 and 30 mm Sorin Memo 4D ring annuloplasty via right mini thoracotomy approach  . Shortness of breath   . Tuberculosis    Tests positive for PPD. dad  had h/o TB.    Past Surgical History:  Procedure Laterality Date  . APPENDECTOMY  1970's  . BUBBLE STUDY  03/18/2019   Procedure: BUBBLE STUDY;  Surgeon: Fay Records, MD;  Location: [facility];  Service: Cardiovascular;;  . CARDIAC CATHETERIZATION  09/2011  . CHOLECYSTECTOMY  01/2010  . DIAGNOSTIC LAPAROSCOPY    . KIDNEY SURGERY    . LEFT AND RIGHT HEART CATHETERIZATION WITH CORONARY ANGIOGRAM N/A 09/30/2011   Procedure: LEFT AND RIGHT HEART CATHETERIZATION WITH CORONARY ANGIOGRAM;  Surgeon: Candee Furbish, MD;  Location: [facility] CATH LAB;  Service: Cardiovascular;  Laterality: N/A;  . MITRAL VALVE REPAIR Right 04/20/2019   Procedure: MINIMALLY INVASIVE MITRAL VALVE REPAIR (MVR) USING MEMO 4D SIZE 30;  Surgeon: Rexene Alberts, MD;  Location: [place];  Service: Open Heart Surgery;  Laterality: Right;  . MULTIPLE EXTRACTIONS WITH ALVEOLOPLASTY  10/06/2011   Procedure: MULTIPLE EXTRACION WITH ALVEOLOPLASTY;  Surgeon: Lenn Cal, DDS;  Location: [place];  Service: Oral Surgery;  Laterality: N/A;  Multiple extraction of tooth #'s 1, 6, 8, 10, 11, 22, 23, 26, 27, 28, 29 with alveoloplasty and Upper right buccal exostoses reductions.  . NEPHRECTOMY RADICAL  01/2010   right   . OVARIAN CYST REMOVAL  1970's   "went through belly button"  . RIGHT/LEFT HEART CATH AND CORONARY ANGIOGRAPHY N/A 03/22/2019   Procedure: RIGHT/LEFT HEART CATH AND CORONARY ANGIOGRAPHY;  Surgeon: [name], MD;  Location: [facility] CV LAB;  Service: Cardiovascular;  Laterality: N/A;  .  TEE WITHOUT CARDIOVERSION  10/01/2011   Procedure: TRANSESOPHAGEAL ECHOCARDIOGRAM (TEE);  Surgeon: [name];  Location: [facility];  Service: Cardiovascular;  Laterality: N/A;  . TEE WITHOUT CARDIOVERSION N/A 03/18/2019   Procedure: TRANSESOPHAGEAL ECHOCARDIOGRAM (TEE);  Surgeon: Fay Records, MD;  Location: [place];  Service: Cardiovascular;  Laterality: N/A;  . TEE WITHOUT CARDIOVERSION N/A 04/20/2019   Procedure:  TRANSESOPHAGEAL ECHOCARDIOGRAM (TEE);  Surgeon: Rexene Alberts, MD;  Location: [place];  Service: Open Heart Surgery;  Laterality: N/A;  . TUBAL LIGATION  1970's  . US ECHOCARDIOGRAPHY  09/2011     reports that she quit smoking about 13 months ago. Her smoking use included cigarettes. She has a 26.50 pack-year smoking history. She has never used smokeless tobacco. She reports current alcohol use. She reports that she does not use drugs.  Allergies  Allergen Reactions  . Ace Inhibitors Other (See Comments)    Pseudoasthma/ renal failure     Family History  Problem Relation Age of Onset  . COPD Mother        DECEASED/SMOKED  . Diabetes Brother   . Diabetes Brother     Prior to Admission medications   Medication Sig Start Date End Date Taking? Authorizing Provider  acetaminophen (TYLENOL) 500 MG tablet Take 1,000 mg by mouth every 6 (six) hours as needed for headache (pain).   Yes [provider]  albuterol (PROVENTIL) (2.5 MG/3ML) 0.083% nebulizer solution Take 3 mLs (2.5 mg total) by nebulization every 6 (six) hours as needed for wheezing or shortness of breath. 03/30/19  Yes Brunetta Jeans, PA-C  albuterol (VENTOLIN HFA) 108 (90 Base) MCG/ACT inhaler Inhale 2 puffs into the lungs every 6 (six) hours as needed for wheezing or shortness of breath. 02/08/20  Yes Brunetta Jeans, PA-C  atorvastatin (LIPITOR) 40 MG tablet TAKE 1 TABLET BY MOUTH ONCE DAILY AT  6  PM Patient taking differently: Take 40 mg by mouth daily.  07/18/19  Yes Brunetta Jeans, PA-C  losartan (COZAAR) 50 MG tablet Take 1 tablet (50 mg total) by mouth daily. 02/08/20  Yes Brunetta Jeans, PA-C  metoprolol tartrate (LOPRESSOR) 25 MG tablet Take 1 tablet (25 mg total) by mouth 2 (two) times daily. 10/31/19  Yes Brunetta Jeans, PA-C  mometasone-formoterol (DULERA) 200-5 MCG/ACT AERO Inhale 2 puffs into the lungs 2 (two) times daily. 02/07/20  Yes Brunetta Jeans, PA-C  Multiple Vitamin (MULTIVITAMIN WITH  MINERALS) TABS tablet Take 1 tablet by mouth daily.   Yes [provider]  triamcinolone (NASACORT) 55 MCG/ACT AERO nasal inhaler Place 2 sprays into the nose daily. Patient taking differently: Place 2 sprays into the nose daily as needed (congestion).  02/08/20  Yes Brunetta Jeans, PA-C  aspirin EC 81 MG tablet Take 1 tablet (81 mg total) by mouth daily. Patient not taking: Reported on 10/31/2019 09/29/18   Bonnielee Haff, MD  Misc. Devices (PULSE OXIMETER FOR FINGER) MISC 1 Device by Does not apply route as needed. 03/11/19   Brunetta Jeans, PA-C    Physical Exam: Constitutional: Moderately built and nourished. Vitals:   04/23/20 1849 04/23/20 1900 04/23/20 1959 04/23/20 2100  BP:  (!) 149/100 (!) 148/93 (!) 175/98  Pulse: 99 (!) 110 (!) 121 (!) 104  Resp: 16 17 (!) 26 (!) 25  Temp:      TempSrc:      SpO2: 100% 100% 97% 95%   Eyes: Anicteric no pallor. ENMT: No discharge from the ears eyes  nose or mouth. Neck: No mass felt.  No neck rigidity.  No JVD appreciated. Respiratory: Bilateral expiratory wheeze and no crepitations. Cardiovascular: S1-S2 heard. Abdomen: Soft nontender bowel sounds present. Musculoskeletal: No edema. Skin: No rash. Neurologic: Alert awake oriented to time place and person.  Moves all extremities. Psychiatric: Appears normal.  Normal affect.   Labs on Admission: I have personally reviewed following labs and imaging studies  CBC: Recent Labs  Lab 04/23/20 1423 04/23/20 1753  WBC 5.4  --   HGB 12.1 12.9  HCT 38.5 38.0  MCV 93.2  --   PLT 250  --    Basic Metabolic Panel: Recent Labs  Lab 04/23/20 1423 04/23/20 1753  NA 140 142  K 4.0 4.1  CL 102  --   CO2 28  --   GLUCOSE 122*  --   BUN 14  --   CREATININE 1.55*  --   CALCIUM 9.3  --    GFR: CrCl cannot be calculated (Unknown ideal weight.). Liver Function Tests: No results for input(s): AST, ALT, ALKPHOS, BILITOT, PROT, ALBUMIN in the last 168 hours. No results for  input(s): LIPASE, AMYLASE in the last 168 hours. No results for input(s): AMMONIA in the last 168 hours. Coagulation Profile: No results for input(s): INR, PROTIME in the last 168 hours. Cardiac Enzymes: No results for input(s): CKTOTAL, CKMB, CKMBINDEX, TROPONINI in the last 168 hours. BNP (last 3 results) No results for input(s): PROBNP in the last 8760 hours. HbA1C: No results for input(s): HGBA1C in the last 72 hours. CBG: No results for input(s): GLUCAP in the last 168 hours. Lipid Profile: No results for input(s): CHOL, HDL, LDLCALC, TRIG, CHOLHDL, LDLDIRECT in the last 72 hours. Thyroid Function Tests: No results for input(s): TSH, T4TOTAL, FREET4, T3FREE, THYROIDAB in the last 72 hours. Anemia Panel: No results for input(s): VITAMINB12, FOLATE, FERRITIN, TIBC, IRON, RETICCTPCT in the last 72 hours. Urine analysis:    Component Value Date/Time   COLORURINE STRAW (A) 04/04/2019 1053   APPEARANCEUR CLEAR 04/04/2019 1053   LABSPEC 1.005 04/04/2019 1053   PHURINE 6.0 04/04/2019 1053   GLUCOSEU NEGATIVE 04/04/2019 1053   GLUCOSEU NEGATIVE 04/29/2017 1122   HGBUR NEGATIVE 04/04/2019 1053   BILIRUBINUR NEGATIVE 04/04/2019 1053   BILIRUBINUR negative 06/11/2018 0911   KETONESUR NEGATIVE 04/04/2019 1053   PROTEINUR NEGATIVE 04/04/2019 1053   UROBILINOGEN 0.2 06/11/2018 0911   UROBILINOGEN 0.2 04/29/2017 1122   NITRITE NEGATIVE 04/04/2019 1053   LEUKOCYTESUR NEGATIVE 04/04/2019 1053   Sepsis Labs: @LABRCNTIP (procalcitonin:4,lacticidven:4) ) Recent Results (from the past 240 hour(s))  SARS Coronavirus 2 by RT PCR (hospital order, performed in [place] hospital lab) Nasopharyngeal Nasopharyngeal Swab     Status: None   Collection Time: 04/23/20  7:57 PM   Specimen: Nasopharyngeal Swab  Result Value Ref Range Status   SARS Coronavirus 2 NEGATIVE NEGATIVE Final    Comment: (NOTE) SARS-CoV-2 target nucleic acids are NOT DETECTED.  The SARS-CoV-2 RNA is generally detectable  in upper and lower respiratory specimens during the acute phase of infection. The lowest concentration of SARS-CoV-2 viral copies this assay can detect is 250 copies / mL. A negative result does not preclude SARS-CoV-2 infection and should not be used as the sole basis for treatment or other patient management decisions.  A negative result may occur with improper specimen collection / handling, submission of specimen other than nasopharyngeal swab, presence of viral mutation(s) within the areas targeted by this assay, and inadequate number of viral copies (<250  copies / mL). A negative result must be combined with clinical observations, patient history, and epidemiological information.  Fact Sheet for Patients:   StrictlyIdeas.no  Fact Sheet for Healthcare Providers: BankingDealers.co.za  This test is not yet approved or  cleared by the [place] FDA and has been authorized for detection and/or diagnosis of SARS-CoV-2 by FDA under an Emergency Use Authorization (EUA).  This EUA will remain in effect (meaning this test can be used) for the duration of the COVID-19 declaration under Section 564(b)(1) of the Act, 21 U.S.C. section 360bbb-3(b)(1), unless the authorization is terminated or revoked sooner.  Performed at [facility], [place] [address], [place], [place] [address]      Radiological Exams on Admission: DG Chest Portable 1 View  Result Date: 04/23/2020 CLINICAL DATA:  Shortness of breath. EXAM: PORTABLE CHEST 1 VIEW COMPARISON:  None. FINDINGS: Very mild atelectasis is seen within the bilateral lung bases. There is no evidence of a pleural effusion or pneumothorax. The heart size and mediastinal contours are within normal limits. An artificial cardiac valve is noted. The visualized skeletal structures are unremarkable. Radiopaque surgical clips are seen in the right upper quadrant. IMPRESSION: Very mild bibasilar  atelectasis. Electronically Signed   By: Virgina Norfolk M.D.   On: 04/23/2020 18:02    EKG: Independently reviewed.  Normal sinus rhythm.  Assessment/Plan Principal Problem:   Acute respiratory failure with hypoxia (HCC) Active Problems:   Essential hypertension   CKD (chronic kidney disease) stage 3, GFR 30-59 ml/min   COPD exacerbation (HCC)   Chronic diastolic congestive heart failure (HCC)   S/P minimally invasive mitral valve repair    1. Acute respiratory failure with hypoxia secondary to COPD exacerbation for which I have placed patient on IV Solu-Medrol nebulizer therapy Pulmicort and Zithromax. 2. History of mitral valve repair for severe mitral regurgitation last year.  BNP is mildly elevated but I think patient's symptoms are most likely from COPD exacerbation.  Will check 2D echo. 3. Chronic kidney disease stage III with creatinine at baseline with history of right-sided nephrectomy for renal cell carcinoma.  Follow metabolic panel closely.  Note that patient is on ARB. 4. Hypertension on ARB and metoprolol. 5. Tobacco abuse advised about quitting.   DVT prophylaxis: Lovenox. Code Status: Full code. Family Communication: Discussed with patient. Disposition Plan: Home. Consults called: None. Admission status: Observation.   Rise Patience MD [facility] Pager [phone].  If 7PM-7AM, please contact night-coverage [email or web address] Password [facility]  04/23/2020, 9:51 PM

## 2020-04-23 NOTE — Progress Notes (Signed)
Patient received from the ED, ambulated from stretcher to the bed. Alert and oriented X4. Oriented to unit and procedures. VSS. No acute needs voiced, call bell in reach. Will continue to monitor.

## 2020-04-23 NOTE — ED Triage Notes (Addendum)
Pt arrives to ED w/ c/o COPD. Pt states she has had increased SOB over the last couple of days. Pt endorses cough, wheezing. Pt states she has been taking here breathing treatments and inhalers however they have not have been working. Resp e/u, wheezing in triage.

## 2020-04-24 ENCOUNTER — Other Ambulatory Visit: Payer: Self-pay

## 2020-04-24 ENCOUNTER — Observation Stay (HOSPITAL_COMMUNITY): Payer: Medicare Other

## 2020-04-24 DIAGNOSIS — Z825 Family history of asthma and other chronic lower respiratory diseases: Secondary | ICD-10-CM | POA: Diagnosis not present

## 2020-04-24 DIAGNOSIS — Z7982 Long term (current) use of aspirin: Secondary | ICD-10-CM | POA: Diagnosis not present

## 2020-04-24 DIAGNOSIS — M810 Age-related osteoporosis without current pathological fracture: Secondary | ICD-10-CM | POA: Diagnosis present

## 2020-04-24 DIAGNOSIS — E7800 Pure hypercholesterolemia, unspecified: Secondary | ICD-10-CM | POA: Diagnosis present

## 2020-04-24 DIAGNOSIS — I10 Essential (primary) hypertension: Secondary | ICD-10-CM | POA: Diagnosis not present

## 2020-04-24 DIAGNOSIS — K219 Gastro-esophageal reflux disease without esophagitis: Secondary | ICD-10-CM | POA: Diagnosis present

## 2020-04-24 DIAGNOSIS — R0602 Shortness of breath: Secondary | ICD-10-CM | POA: Diagnosis not present

## 2020-04-24 DIAGNOSIS — J441 Chronic obstructive pulmonary disease with (acute) exacerbation: Secondary | ICD-10-CM | POA: Diagnosis not present

## 2020-04-24 DIAGNOSIS — Z20822 Contact with and (suspected) exposure to covid-19: Secondary | ICD-10-CM | POA: Diagnosis present

## 2020-04-24 DIAGNOSIS — N1832 Chronic kidney disease, stage 3b: Secondary | ICD-10-CM | POA: Diagnosis not present

## 2020-04-24 DIAGNOSIS — F17210 Nicotine dependence, cigarettes, uncomplicated: Secondary | ICD-10-CM | POA: Diagnosis present

## 2020-04-24 DIAGNOSIS — Z952 Presence of prosthetic heart valve: Secondary | ICD-10-CM | POA: Diagnosis not present

## 2020-04-24 DIAGNOSIS — I130 Hypertensive heart and chronic kidney disease with heart failure and stage 1 through stage 4 chronic kidney disease, or unspecified chronic kidney disease: Secondary | ICD-10-CM | POA: Diagnosis present

## 2020-04-24 DIAGNOSIS — N1831 Chronic kidney disease, stage 3a: Secondary | ICD-10-CM

## 2020-04-24 DIAGNOSIS — J432 Centrilobular emphysema: Secondary | ICD-10-CM | POA: Diagnosis present

## 2020-04-24 DIAGNOSIS — B342 Coronavirus infection, unspecified: Secondary | ICD-10-CM | POA: Diagnosis present

## 2020-04-24 DIAGNOSIS — I714 Abdominal aortic aneurysm, without rupture: Secondary | ICD-10-CM | POA: Diagnosis present

## 2020-04-24 DIAGNOSIS — Z9889 Other specified postprocedural states: Secondary | ICD-10-CM

## 2020-04-24 DIAGNOSIS — I340 Nonrheumatic mitral (valve) insufficiency: Secondary | ICD-10-CM

## 2020-04-24 DIAGNOSIS — I5032 Chronic diastolic (congestive) heart failure: Secondary | ICD-10-CM | POA: Diagnosis not present

## 2020-04-24 DIAGNOSIS — Z905 Acquired absence of kidney: Secondary | ICD-10-CM | POA: Diagnosis not present

## 2020-04-24 DIAGNOSIS — Z8701 Personal history of pneumonia (recurrent): Secondary | ICD-10-CM | POA: Diagnosis not present

## 2020-04-24 DIAGNOSIS — M1990 Unspecified osteoarthritis, unspecified site: Secondary | ICD-10-CM | POA: Diagnosis present

## 2020-04-24 DIAGNOSIS — I342 Nonrheumatic mitral (valve) stenosis: Secondary | ICD-10-CM

## 2020-04-24 DIAGNOSIS — J9601 Acute respiratory failure with hypoxia: Secondary | ICD-10-CM | POA: Diagnosis not present

## 2020-04-24 DIAGNOSIS — Z79899 Other long term (current) drug therapy: Secondary | ICD-10-CM | POA: Diagnosis not present

## 2020-04-24 DIAGNOSIS — Z85528 Personal history of other malignant neoplasm of kidney: Secondary | ICD-10-CM | POA: Diagnosis not present

## 2020-04-24 DIAGNOSIS — Z888 Allergy status to other drugs, medicaments and biological substances status: Secondary | ICD-10-CM | POA: Diagnosis not present

## 2020-04-24 LAB — BASIC METABOLIC PANEL
Anion gap: 11 (ref 5–15)
BUN: 16 mg/dL (ref 8–23)
CO2: 25 mmol/L (ref 22–32)
Calcium: 9.6 mg/dL (ref 8.9–10.3)
Chloride: 105 mmol/L (ref 98–111)
Creatinine, Ser: 1.43 mg/dL — ABNORMAL HIGH (ref 0.44–1.00)
GFR calc Af Amer: 42 mL/min — ABNORMAL LOW (ref >60)
GFR calc non Af Amer: 36 mL/min — ABNORMAL LOW (ref >60)
Glucose, Bld: 163 mg/dL — ABNORMAL HIGH (ref 70–99)
Potassium: 4.4 mmol/L (ref 3.5–5.1)
Sodium: 141 mmol/L (ref 135–145)

## 2020-04-24 LAB — CBC
HCT: 36.8 % (ref 36.0–46.0)
Hemoglobin: 11.7 g/dL — ABNORMAL LOW (ref 12.0–15.0)
MCH: 29.4 pg (ref 26.0–34.0)
MCHC: 31.8 g/dL (ref 30.0–36.0)
MCV: 92.5 fL (ref 80.0–100.0)
Platelets: 235 10*3/uL (ref 150–400)
RBC: 3.98 MIL/uL (ref 3.87–5.11)
RDW: 12.1 % (ref 11.5–15.5)
WBC: 3.7 10*3/uL — ABNORMAL LOW (ref 4.0–10.5)
nRBC: 0 % (ref 0.0–0.2)

## 2020-04-24 LAB — ECHOCARDIOGRAM COMPLETE
Height: 61 in
Weight: 2313.95 oz

## 2020-04-24 MED ORDER — NICOTINE 14 MG/24HR TD PT24
14.0000 mg | MEDICATED_PATCH | Freq: Every day | TRANSDERMAL | Status: DC
  Administered 2020-04-24 – 2020-04-26 (×3): 14 mg via TRANSDERMAL
  Filled 2020-04-24 (×3): qty 1

## 2020-04-24 MED ORDER — ACETAMINOPHEN 325 MG PO TABS
650.0000 mg | ORAL_TABLET | Freq: Four times a day (QID) | ORAL | Status: DC | PRN
  Administered 2020-04-24 – 2020-04-26 (×4): 650 mg via ORAL
  Filled 2020-04-24 (×4): qty 2

## 2020-04-24 NOTE — Progress Notes (Addendum)
PROGRESS NOTE    Katie [name]  BJY:[id number] DOB: 1946/03/20 DOA: 04/23/2020 PCP: Brunetta Jeans, PA-C     Chief Complaint  Patient presents with  . COPD    Brief Narrative:   74 year old lady prior history of COPD, GERD, abdominal aortic aneurysm, history of renal cell carcinoma, moderate history s/p right-sided nephrectomy, hypertension, s/p mitral valve repair in May 2020, stage IIIa CKD, presents to ED for worsening shortness of breath associated with cough for the last 4 to 5 days. On admission chest x-ray does not show any pneumonia.  EKG does not show any ischemic changes.  BNP was elevated at 432 and troponin was negative.  COVID-19 screening test was negative.  Patient was admitted for acute COPD exacerbation.  Assessment & Plan:   Principal Problem:   Acute respiratory failure with hypoxia (HCC) Active Problems:   Essential hypertension   CKD (chronic kidney disease) stage 3, GFR 30-59 ml/min   COPD exacerbation (HCC)   Chronic diastolic congestive heart failure (HCC)   S/P minimally invasive mitral valve repair   Acute respiratory failure with hypoxia secondary to acute COPD exacerbation Patient still wheezing, short of breath and has cough on exam today.  Recommend to continue IV Solu-Medrol, Pulmicort and duo nebs and azithromycin for acute bronchitis. Nasal cannula oxygen to keep sats greater than 90%. Flutter valve if needed. CT chest without contrast ordered for further evaluation.   Elevated BNP with history of mitral valve regurgitation S/p mitral valve repair Echocardiogram ordered for evaluation of CHF. No pedal edema on exam today   Stage III CKD probably secondary to right-sided nephrectomy for renal cell carcinoma.  Creatinine appears to be at baseline at this time.   Essential hypertension Blood pressure parameters are well controlled at this time.   Tobacco abuse Counseled for cessation Nicotine patch ordered.  Abdominal aortic  aneurysm measuring up to 4.0 x 4.0 cm, Recommend outpatient follow-up with annual ultrasound.   DVT prophylaxis: Lovenox Code Status: Full code Family Communication: none at bedside.  Disposition:   Status is: Observation  The patient will require care spanning > 2 midnights and should be moved to inpatient because: IV treatments appropriate due to intensity of illness or inability to take PO  Dispo: The patient is from: Home              Anticipated d/c is to: Home              Anticipated d/c date is: 2 days              Patient currently is not medically stable to d/c.       Consultants:   None.    Procedures: echocadiogram.   Antimicrobials:  Anti-infectives (From admission, onward)   Start     Dose/Rate Route Frequency Ordered Stop   04/23/20 2200  azithromycin (ZITHROMAX) 500 mg in sodium chloride 0.9 % 250 mL IVPB     Discontinue     500 mg 250 mL/hr over 60 Minutes Intravenous Every 24 hours 04/23/20 2152         Subjective: She reports still short of breath and cough.  Objective: Vitals:   04/24/20 0454 04/24/20 0728 04/24/20 0730 04/24/20 0802  BP:    129/90  Pulse:    97  Resp: (!) 23  20 18   Temp:    98.2 F (36.8 C)  TempSrc:      SpO2:  98%  97%  Weight:  Height:        Intake/Output Summary (Last 24 hours) at 04/24/2020 1134 Last data filed at 04/24/2020 0730 Gross per 24 hour  Intake 610 ml  Output --  Net 610 ml   Filed Weights   04/23/20 2241  Weight: 65.6 kg    Examination:  General exam: Appears calm and comfortable  Respiratory system: Bilateral expiratory and inspiratory wheezing associated with cough and tachypnea, air entry diminished at bases Cardiovascular system: S1 & S2 heard, RRR. No JVD. No pedal edema. Gastrointestinal system: Abdomen is nondistended, soft and nontender. Normal bowel sounds heard. Central nervous system: Alert and oriented. No focal neurological deficits. Extremities: Symmetric 5 x 5  power. Skin: No rashes, lesions or ulcers Psychiatry: Mood & affect appropriate.     Data Reviewed: I have personally reviewed following labs and imaging studies  CBC: Recent Labs  Lab 04/23/20 1423 04/23/20 1753 04/24/20 0432  WBC 5.4  --  3.7*  HGB 12.1 12.9 11.7*  HCT 38.5 38.0 36.8  MCV 93.2  --  92.5  PLT 250  --  431    Basic Metabolic Panel: Recent Labs  Lab 04/23/20 1423 04/23/20 1753 04/24/20 0432  NA 140 142 141  K 4.0 4.1 4.4  CL 102  --  105  CO2 28  --  25  GLUCOSE 122*  --  163*  BUN 14  --  16  CREATININE 1.55*  --  1.43*  CALCIUM 9.3  --  9.6    GFR: Estimated Creatinine Clearance: 30.8 mL/min (A) (by C-G formula based on SCr of 1.43 mg/dL (H)).  Liver Function Tests: No results for input(s): AST, ALT, ALKPHOS, BILITOT, PROT, ALBUMIN in the last 168 hours.  CBG: No results for input(s): GLUCAP in the last 168 hours.   Recent Results (from the past 240 hour(s))  SARS Coronavirus 2 by RT PCR (hospital order, performed in [facility] hospital lab) Nasopharyngeal Nasopharyngeal Swab     Status: None   Collection Time: 04/23/20  7:57 PM   Specimen: Nasopharyngeal Swab  Result Value Ref Range Status   SARS Coronavirus 2 NEGATIVE NEGATIVE Final    Comment: (NOTE) SARS-CoV-2 target nucleic acids are NOT DETECTED.  The SARS-CoV-2 RNA is generally detectable in upper and lower respiratory specimens during the acute phase of infection. The lowest concentration of SARS-CoV-2 viral copies this assay can detect is 250 copies / mL. A negative result does not preclude SARS-CoV-2 infection and should not be used as the sole basis for treatment or other patient management decisions.  A negative result may occur with improper specimen collection / handling, submission of specimen other than nasopharyngeal swab, presence of viral mutation(s) within the areas targeted by this assay, and inadequate number of viral copies (<250 copies / mL). A negative result  must be combined with clinical observations, patient history, and epidemiological information.  Fact Sheet for Patients:   StrictlyIdeas.no  Fact Sheet for Healthcare Providers: BankingDealers.co.za  This test is not yet approved or  cleared by the [place] FDA and has been authorized for detection and/or diagnosis of SARS-CoV-2 by FDA under an Emergency Use Authorization (EUA).  This EUA will remain in effect (meaning this test can be used) for the duration of the COVID-19 declaration under Section 564(b)(1) of the Act, 21 U.S.C. section 360bbb-3(b)(1), unless the authorization is terminated or revoked sooner.  Performed at [facility], [place] [address], [place], [place] [address]  Radiology Studies: CT CHEST WO CONTRAST  Result Date: 04/24/2020 CLINICAL DATA:  Chest pain, shortness of breath EXAM: CT CHEST WITHOUT CONTRAST TECHNIQUE: Multidetector CT imaging of the chest was performed following the standard protocol without IV contrast. COMPARISON:  Chest radiographs, 04/23/2020, CT angiogram abdomen and pelvis, 03/29/2019 FINDINGS: Cardiovascular: Aortic atherosclerosis. Normal heart size. Scattered coronary artery calcifications. Mitral valve prosthesis. Enlargement of the main pulmonary artery measuring up to 3.7 cm. No pericardial effusion. Mediastinum/Nodes: No enlarged mediastinal, hilar, or axillary lymph nodes. Thyroid gland, trachea, and esophagus demonstrate no significant findings. Lungs/Pleura: Mild centrilobular and paraseptal emphysema. Scattered nonspecific ground-glass opacities of the dependent lungs bilaterally, more conspicuous on the left (series 4, image 61, 111). Mild bronchial plugging throughout. No pleural effusion or pneumothorax. Upper Abdomen: No acute abnormality. Status post right nephrectomy and adrenalectomy, partially imaged. Abdominal aortic aneurysm measuring up to 4.0 x 4.0 cm. Benign  cyst or hemangioma of the liver dome. Musculoskeletal: No chest wall mass or suspicious bone lesions identified. IMPRESSION: 1. Scattered nonspecific infectious or inflammatory ground-glass opacities of the dependent lungs bilaterally, more conspicuous on the left. 2. Mild bronchial plugging throughout, consistent with nonspecific infectious or inflammatory bronchitis. 3. Emphysema (ICD10-J43.9). 4. Coronary artery disease. 5. Enlargement of the main pulmonary artery, as can be seen in pulmonary hypertension. 6. Aortic Atherosclerosis (ICD10-I70.0). 7. Abdominal aortic aneurysm measuring up to 4.0 x 4.0 cm, better characterized by prior CT angiogram of the abdomen and pelvis. Electronically Signed   By: Eddie Candle M.D.   On: 04/24/2020 10:42   DG Chest Portable 1 View  Result Date: 04/23/2020 CLINICAL DATA:  Shortness of breath. EXAM: PORTABLE CHEST 1 VIEW COMPARISON:  None. FINDINGS: Very mild atelectasis is seen within the bilateral lung bases. There is no evidence of a pleural effusion or pneumothorax. The heart size and mediastinal contours are within normal limits. An artificial cardiac valve is noted. The visualized skeletal structures are unremarkable. Radiopaque surgical clips are seen in the right upper quadrant. IMPRESSION: Very mild bibasilar atelectasis. Electronically Signed   By: Virgina Norfolk M.D.   On: 04/23/2020 18:02        Scheduled Meds: . atorvastatin  40 mg Oral Daily  . budesonide (PULMICORT) nebulizer solution  0.25 mg Nebulization BID  . enoxaparin (LOVENOX) injection  40 mg Subcutaneous Q24H  . guaiFENesin  600 mg Oral BID  . ipratropium-albuterol  3 mL Nebulization Q6H  . losartan  50 mg Oral Daily  . methylPREDNISolone (SOLU-MEDROL) injection  40 mg Intravenous Q12H  . metoprolol tartrate  25 mg Oral BID  . nicotine  14 mg Transdermal Daily   Continuous Infusions: . azithromycin 500 mg (04/24/20 0009)     LOS: 0 days        Hosie Poisson, MD [facility]   To contact the attending provider between 7A-7P or the covering provider during after hours 7P-7A, please log into the web site [email or web address] and access using universal [place] password for that web site. If you do not have the password, please call the hospital operator.  04/24/2020, 11:34 AM

## 2020-04-24 NOTE — Progress Notes (Signed)
  Echocardiogram 2D Echocardiogram has been performed.  Katie [name] 04/24/2020, 11:22 AM

## 2020-04-25 DIAGNOSIS — I5032 Chronic diastolic (congestive) heart failure: Secondary | ICD-10-CM

## 2020-04-25 DIAGNOSIS — N1832 Chronic kidney disease, stage 3b: Secondary | ICD-10-CM

## 2020-04-25 LAB — RESPIRATORY PANEL BY PCR

## 2020-04-25 MED ORDER — DM-GUAIFENESIN ER 30-600 MG PO TB12
1.0000 | ORAL_TABLET | Freq: Two times a day (BID) | ORAL | Status: DC
  Administered 2020-04-25 – 2020-04-26 (×2): 1 via ORAL
  Filled 2020-04-25 (×2): qty 1

## 2020-04-25 MED ORDER — SALINE SPRAY 0.65 % NA SOLN
1.0000 | NASAL | Status: DC | PRN
  Administered 2020-04-25: 1 via NASAL
  Filled 2020-04-25 (×2): qty 44

## 2020-04-25 MED ORDER — FLUTICASONE PROPIONATE 50 MCG/ACT NA SUSP
1.0000 | Freq: Every day | NASAL | Status: DC
  Administered 2020-04-25 – 2020-04-26 (×2): 1 via NASAL
  Filled 2020-04-25: qty 16

## 2020-04-25 NOTE — Progress Notes (Signed)
PROGRESS NOTE    Katie [name]  GHW:[id number] DOB: 12/15/1945 DOA: 04/23/2020 PCP: Brunetta Jeans, PA-C     Chief Complaint  Patient presents with  . COPD    Brief Narrative:  74 year old lady prior history of COPD, GERD, abdominal aortic aneurysm, history of renal cell carcinoma, moderate history s/p right-sided nephrectomy, hypertension, s/p mitral valve repair in May 2020, stage IIIa CKD, presents to ED for worsening shortness of breath associated with cough for the last 4 to 5 days. On admission chest x-ray does not show any pneumonia.  EKG does not show any ischemic changes.  BNP was elevated at 432 and troponin was negative.  COVID-19 screening test was negative.  Patient was admitted for acute COPD exacerbation.   Assessment & Plan:   Principal Problem:   Acute respiratory failure with hypoxia (HCC) Active Problems:   Essential hypertension   CKD (chronic kidney disease) stage 3, GFR 30-59 ml/min   COPD exacerbation (HCC)   Chronic diastolic congestive heart failure (HCC)   S/P minimally invasive mitral valve repair  Acute respiratory failure with hypoxia 2/2 acute COPD exacerbation 2/2 coronovirus infection (not the novel covid) Patient currently on room air, still with bilateral wheezing noted CT chest without contrast showed scattered nonspecific infectious or inflammatory groundglass opacity of the dependent lungs bilaterally, mild bronchial plugging throughout, emphysema Continue Pulmicort, DuoNeb Continue IV Solu-Medrol, azithromycin Flonase nasal spray, cough suppressant Supplemental oxygen as needed, flutter valve as needed  Elevated BNP with history of mitral valve regurgitation Possible diastolic HF S/p mitral valve repair Troponin negative Echocardiogram showed EF of 60 to 65%, no regional wall motion abnormality, left ventricular diastolic parameters were indeterminate Monitor closely  CKD stage IIIb/right-sided nephrectomy status post renal cell  carcinoma Creatinine at baseline Daily BMP  Essential hypertension Continue losartan, metoprolol  Abdominal aortic aneurysm measuring up to 4.0 x 4.0 cm Recommend outpatient follow-up with annual ultrasound  Tobacco abuse Advised to quit Nicotine patch ordered     DVT prophylaxis: Lovenox Code Status: Full code Family Communication: Discussed with patient  Disposition:   Status is: Inpatient  The patient will require care spanning > 2 midnights and should be moved to inpatient because: IV treatments appropriate due to intensity of illness or inability to take PO and Inpatient level of care appropriate due to severity of illness  Dispo: The patient is from: Home              Anticipated d/c is to: Home              Anticipated d/c date is: 2 days              Patient currently is not medically stable to d/c.       Consultants:   None.    Procedures: None  Antimicrobials:  Anti-infectives (From admission, onward)   Start     Dose/Rate Route Frequency Ordered Stop   04/23/20 2200  azithromycin (ZITHROMAX) 500 mg in sodium chloride 0.9 % 250 mL IVPB     Discontinue     500 mg 250 mL/hr over 60 Minutes Intravenous Every 24 hours 04/23/20 2152         Subjective: Patient still noted to be wheezing, with some increased work of breathing, cough.  Patient denies any chest pain, abdominal pain, nausea/vomiting, fever/chills.    Objective: Vitals:   04/25/20 0100 04/25/20 0718 04/25/20 1415 04/25/20 1628  BP:    (!) 141/84  Pulse:    77  Resp:    16  Temp:    98.3 F (36.8 C)  TempSrc:    Oral  SpO2: 96% 95% 96% 98%  Weight:      Height:        Intake/Output Summary (Last 24 hours) at 04/25/2020 1718 Last data filed at 04/25/2020 1610 Gross per 24 hour  Intake 120 ml  Output --  Net 120 ml   Filed Weights   04/23/20 2241  Weight: 65.6 kg    Examination:  General: NAD   Cardiovascular: S1, S2 present  Respiratory:  Bilateral wheezing  noted  Abdomen: Soft, nontender, nondistended, bowel sounds present  Musculoskeletal: No bilateral pedal edema noted  Skin: Normal  Psychiatry: Normal mood   Data Reviewed: I have personally reviewed following labs and imaging studies  CBC: Recent Labs  Lab 04/23/20 1423 04/23/20 1753 04/24/20 0432  WBC 5.4  --  3.7*  HGB 12.1 12.9 11.7*  HCT 38.5 38.0 36.8  MCV 93.2  --  92.5  PLT 250  --  960    Basic Metabolic Panel: Recent Labs  Lab 04/23/20 1423 04/23/20 1753 04/24/20 0432  NA 140 142 141  K 4.0 4.1 4.4  CL 102  --  105  CO2 28  --  25  GLUCOSE 122*  --  163*  BUN 14  --  16  CREATININE 1.55*  --  1.43*  CALCIUM 9.3  --  9.6    GFR: Estimated Creatinine Clearance: 30.8 mL/min (A) (by C-G formula based on SCr of 1.43 mg/dL (H)).  Liver Function Tests: No results for input(s): AST, ALT, ALKPHOS, BILITOT, PROT, ALBUMIN in the last 168 hours.  CBG: No results for input(s): GLUCAP in the last 168 hours.   Recent Results (from the past 240 hour(s))  SARS Coronavirus 2 by RT PCR (hospital order, performed in [facility] hospital lab) Nasopharyngeal Nasopharyngeal Swab     Status: None   Collection Time: 04/23/20  7:57 PM   Specimen: Nasopharyngeal Swab  Result Value Ref Range Status   SARS Coronavirus 2 NEGATIVE NEGATIVE Final    Comment: (NOTE) SARS-CoV-2 target nucleic acids are NOT DETECTED.  The SARS-CoV-2 RNA is generally detectable in upper and lower respiratory specimens during the acute phase of infection. The lowest concentration of SARS-CoV-2 viral copies this assay can detect is 250 copies / mL. A negative result does not preclude SARS-CoV-2 infection and should not be used as the sole basis for treatment or other patient management decisions.  A negative result may occur with improper specimen collection / handling, submission of specimen other than nasopharyngeal swab, presence of viral mutation(s) within the areas targeted by this  assay, and inadequate number of viral copies (<250 copies / mL). A negative result must be combined with clinical observations, patient history, and epidemiological information.  Fact Sheet for Patients:   StrictlyIdeas.no  Fact Sheet for Healthcare Providers: BankingDealers.co.za  This test is not yet approved or  cleared by the [place] FDA and has been authorized for detection and/or diagnosis of SARS-CoV-2 by FDA under an Emergency Use Authorization (EUA).  This EUA will remain in effect (meaning this test can be used) for the duration of the COVID-19 declaration under Section 564(b)(1) of the Act, 21 U.S.C. section 360bbb-3(b)(1), unless the authorization is terminated or revoked sooner.  Performed at [facility], [place] [address], [place], [place] [address]   Respiratory Panel by PCR     Status: Abnormal   Collection  Time: 04/25/20 10:08 AM   Specimen: Nasopharyngeal Swab; Respiratory  Result Value Ref Range Status   Adenovirus NOT DETECTED NOT DETECTED Final   Coronavirus 229E NOT DETECTED NOT DETECTED Final    Comment: (NOTE) The Coronavirus on the Respiratory Panel, DOES NOT test for the novel  Coronavirus (2019 nCoV)    Coronavirus HKU1 NOT DETECTED NOT DETECTED Final   Coronavirus NL63 NOT DETECTED NOT DETECTED Final   Coronavirus OC43 DETECTED (A) NOT DETECTED Final   Metapneumovirus NOT DETECTED NOT DETECTED Final   Rhinovirus / Enterovirus NOT DETECTED NOT DETECTED Final   Influenza A NOT DETECTED NOT DETECTED Final   Influenza B NOT DETECTED NOT DETECTED Final   Parainfluenza Virus 1 NOT DETECTED NOT DETECTED Final   Parainfluenza Virus 2 NOT DETECTED NOT DETECTED Final   Parainfluenza Virus 3 NOT DETECTED NOT DETECTED Final   Parainfluenza Virus 4 NOT DETECTED NOT DETECTED Final   Respiratory Syncytial Virus NOT DETECTED NOT DETECTED Final   Bordetella pertussis NOT DETECTED NOT DETECTED Final    Chlamydophila pneumoniae NOT DETECTED NOT DETECTED Final   Mycoplasma pneumoniae NOT DETECTED NOT DETECTED Final    Comment: Performed at [facility], [place] [address], [place],  [address]         Radiology Studies: CT CHEST WO CONTRAST  Result Date: 04/24/2020 CLINICAL DATA:  Chest pain, shortness of breath EXAM: CT CHEST WITHOUT CONTRAST TECHNIQUE: Multidetector CT imaging of the chest was performed following the standard protocol without IV contrast. COMPARISON:  Chest radiographs, 04/23/2020, CT angiogram abdomen and pelvis, 03/29/2019 FINDINGS: Cardiovascular: Aortic atherosclerosis. Normal heart size. Scattered coronary artery calcifications. Mitral valve prosthesis. Enlargement of the main pulmonary artery measuring up to 3.7 cm. No pericardial effusion. Mediastinum/Nodes: No enlarged mediastinal, hilar, or axillary lymph nodes. Thyroid gland, trachea, and esophagus demonstrate no significant findings. Lungs/Pleura: Mild centrilobular and paraseptal emphysema. Scattered nonspecific ground-glass opacities of the dependent lungs bilaterally, more conspicuous on the left (series 4, image 61, 111). Mild bronchial plugging throughout. No pleural effusion or pneumothorax. Upper Abdomen: No acute abnormality. Status post right nephrectomy and adrenalectomy, partially imaged. Abdominal aortic aneurysm measuring up to 4.0 x 4.0 cm. Benign cyst or hemangioma of the liver dome. Musculoskeletal: No chest wall mass or suspicious bone lesions identified. IMPRESSION: 1. Scattered nonspecific infectious or inflammatory ground-glass opacities of the dependent lungs bilaterally, more conspicuous on the left. 2. Mild bronchial plugging throughout, consistent with nonspecific infectious or inflammatory bronchitis. 3. Emphysema (ICD10-J43.9). 4. Coronary artery disease. 5. Enlargement of the main pulmonary artery, as can be seen in pulmonary hypertension. 6. Aortic Atherosclerosis (ICD10-I70.0). 7.  Abdominal aortic aneurysm measuring up to 4.0 x 4.0 cm, better characterized by prior CT angiogram of the abdomen and pelvis. Electronically Signed   By: Eddie Candle M.D.   On: 04/24/2020 10:42   DG Chest Portable 1 View  Result Date: 04/23/2020 CLINICAL DATA:  Shortness of breath. EXAM: PORTABLE CHEST 1 VIEW COMPARISON:  None. FINDINGS: Very mild atelectasis is seen within the bilateral lung bases. There is no evidence of a pleural effusion or pneumothorax. The heart size and mediastinal contours are within normal limits. An artificial cardiac valve is noted. The visualized skeletal structures are unremarkable. Radiopaque surgical clips are seen in the right upper quadrant. IMPRESSION: Very mild bibasilar atelectasis. Electronically Signed   By: Virgina Norfolk M.D.   On: 04/23/2020 18:02   ECHOCARDIOGRAM COMPLETE  Result Date: 04/24/2020    ECHOCARDIOGRAM REPORT   Patient Name:  Lorrin Mais Date of Exam: 04/24/2020 Medical Rec #:  [id number]       Height:       61.0 in Accession #:    [id number]      Weight:       144.6 lb Date of Birth:  02-28-1946       BSA:          1.646 m Patient Age:    52 years        BP:           129/90 mmHg Patient Gender: F               HR:           87 bpm. Exam Location:  Inpatient Procedure: 2D Echo Indications:    dyspnea  History:        Patient has prior history of Echocardiogram examinations, most                 recent 04/21/2019. CHF, COPD; Risk Factors:Hypertension and                 Current Smoker. Mitral valve repair.                  Mitral Valve: prosthetic annuloplasty ring valve is present in                 the mitral position.  Sonographer:    Jannett Celestine RDCS (AE) Referring Phys: [place]  1. Technically difficult; normal LV systolic function; elevated LV filling pressure; s/p MV repair with mild to moderate MS (MVA 1.9 cm2; mean gradient 10 mmHg) and mild MR; probable elevated LVOT gradient (not well interrogated but possibly  3 m/s; suggest TEE to further assess if clinically indicated).  2. Left ventricular ejection fraction, by estimation, is 60 to 65%. The left ventricle has normal function. The left ventricle has no regional wall motion abnormalities. There is mild left ventricular hypertrophy. Left ventricular diastolic parameters are indeterminate. Elevated left atrial pressure.  3. Right ventricular systolic function is normal. The right ventricular size is normal.  4. The mitral valve has been repaired/replaced. Mild mitral valve regurgitation. Mild to moderate mitral stenosis. There is a prosthetic annuloplasty ring present in the mitral position.  5. The aortic valve has an indeterminant number of cusps. Aortic valve regurgitation is not visualized. No aortic stenosis is present.  6. The inferior vena cava is normal in size with greater than 50% respiratory variability, suggesting right atrial pressure of 3 mmHg. FINDINGS  Left Ventricle: Left ventricular ejection fraction, by estimation, is 60 to 65%. The left ventricle has normal function. The left ventricle has no regional wall motion abnormalities. The left ventricular internal cavity size was normal in size. There is  mild left ventricular hypertrophy. Left ventricular diastolic parameters are indeterminate. Elevated left atrial pressure. Right Ventricle: The right ventricular size is normal.Right ventricular systolic function is normal. Left Atrium: Left atrial size was normal in size. Right Atrium: Right atrial size was normal in size. Pericardium: There is no evidence of pericardial effusion. Mitral Valve: The mitral valve has been repaired/replaced. Normal mobility of the mitral valve leaflets. Mild mitral valve regurgitation. There is a prosthetic annuloplasty ring present in the mitral position. Mild to moderate mitral valve stenosis. MV peak gradient, 17.5 mmHg. The mean mitral valve gradient is 10.0 mmHg. Tricuspid Valve: The tricuspid valve is normal in structure.  Tricuspid valve regurgitation is trivial. No  evidence of tricuspid stenosis. Aortic Valve: The aortic valve has an indeterminant number of cusps. Aortic valve regurgitation is not visualized. No aortic stenosis is present. Pulmonic Valve: The pulmonic valve was not well visualized. Pulmonic valve regurgitation is not visualized. No evidence of pulmonic stenosis. Aorta: The aortic root is normal in size and structure. Venous: The inferior vena cava is normal in size with greater than 50% respiratory variability, suggesting right atrial pressure of 3 mmHg. IAS/Shunts: No atrial level shunt detected by color flow Doppler. Additional Comments: Technically difficult; normal LV systolic function; elevated LV filling pressure; s/p MV repair with mild to moderate MS (MVA 1.9 cm2; mean gradient 10 mmHg) and mild MR; probable elevated LVOT gradient (not well interrogated but possibly 3 m/s; suggest TEE to further assess if clinically indicated).  LEFT VENTRICLE PLAX 2D LVIDd:         3.20 cm  Diastology LVIDs:         2.30 cm  LV e' lateral:   6.64 cm/s LV PW:         0.90 cm  LV E/e' lateral: 28.8 LV IVS:        1.20 cm  LV e' medial:    5.77 cm/s LVOT diam:     2.20 cm  LV E/e' medial:  33.1 LV SV:         72 LV SV Index:   44 LVOT Area:     3.80 cm  LEFT ATRIUM             Index LA diam:        3.50 cm 2.13 cm/m LA Vol (A2C):   19.9 ml 12.09 ml/m LA Vol (A4C):   26.4 ml 16.04 ml/m LA Biplane Vol: 24.8 ml 15.07 ml/m  AORTIC VALVE LVOT Vmax:   114.00 cm/s LVOT Vmean:  82.100 cm/s LVOT VTI:    0.190 m  AORTA Ao Root diam: 3.10 cm MITRAL VALVE MV Area (PHT): 1.94 cm     SHUNTS MV Peak grad:  17.5 mmHg    Systemic VTI:  0.19 m MV Mean grad:  10.0 mmHg    Systemic Diam: 2.20 cm MV Vmax:       2.09 m/s MV Vmean:      149.0 cm/s MV Decel Time: 391 msec MV E velocity: 191.00 cm/s MV A velocity: 190.00 cm/s MV E/A ratio:  1.01 Kirk Ruths MD Electronically signed by Kirk Ruths MD Signature Date/Time: 04/24/2020/1:39:22 PM     Final         Scheduled Meds: . atorvastatin  40 mg Oral Daily  . budesonide (PULMICORT) nebulizer solution  0.25 mg Nebulization BID  . dextromethorphan-guaiFENesin  1 tablet Oral BID  . enoxaparin (LOVENOX) injection  40 mg Subcutaneous Q24H  . fluticasone  1 spray Each Nare Daily  . ipratropium-albuterol  3 mL Nebulization Q6H  . losartan  50 mg Oral Daily  . methylPREDNISolone (SOLU-MEDROL) injection  40 mg Intravenous Q12H  . metoprolol tartrate  25 mg Oral BID  . nicotine  14 mg Transdermal Daily   Continuous Infusions: . azithromycin 500 mg (04/24/20 2122)     LOS: 1 day        Alma Friendly, MD [facility]  04/25/2020, 5:18 PM

## 2020-04-25 NOTE — Evaluation (Signed)
Physical Therapy Evaluation - One-time Patient Details Name: Katie [name] MRN: [id number] DOB: 05/09/1946 Today's Date: 04/25/2020   History of Present Illness  74 y.o. female with known history of COPD with ongoing tobacco abuse, severe mitral regurgitation status post mitral valve repair last year in May 2020, chronic kidney disease stage III with history of right-sided nephrectomy for renal cell carcinoma, hypertension, AAA, TB presents to the ER on 7/5 for acute respiratory failure secondary to COPD exacerbation.  Clinical Impression   Pt presents with [facility] strength, balance, and mobility, pt's only mobility issue present is dyspnea on exertion. Pt ambulated great hallway distance, requiring standing rest breaks cued by PT to recover dyspnea. Pt scored a 23/24 on DGI, demonstrating low fall risk, and pt with no evidence of unsteadiness at any point during evaluation. PT educated pt on the importance of pursed lip breathing technique and energy conservation strategies at home and at work. Pt understands, all education completed and no further PT services acutely or post-acutely needed. PT to sign off at this time.    SpO2 90% and greater on RA during ambulation.     Follow Up Recommendations No PT follow up    Equipment Recommendations  None recommended by PT    Recommendations for Other Services       Precautions / Restrictions Precautions Precautions: None Restrictions Weight Bearing Restrictions: No      Mobility  Bed Mobility Overal bed mobility: Modified Independent             General bed mobility comments: increased time to get to EOB with use of HOB elevation.  Transfers Overall transfer level: Modified independent               General transfer comment: Mod I for increased time to rise, no physical assist  Ambulation/Gait Ambulation/Gait assistance: Modified independent (Device/Increase time) Gait Distance (Feet): 600 Feet Assistive device:  None Gait Pattern/deviations: WFL(Within Functional Limits);Step-through pattern Gait velocity: WFL   General Gait Details: mod I for requiring cuing for standing rest breaks to recover DOE 3/4, no evidence of imbalance during gait with SpO2 90% and greater on RA.  Stairs Stairs: Yes Stairs assistance: Supervision Stair Management: One rail Right;Alternating pattern;Forwards Number of Stairs: 5 General stair comments: for safety, use of handrails to self-steady.  Wheelchair Mobility    Modified Rankin (Stroke Patients Only)       Balance Overall balance assessment: Modified Independent                               Standardized Balance Assessment Standardized Balance Assessment : Dynamic Gait Index   Dynamic Gait Index Level Surface: Normal Change in Gait Speed: Normal Gait with Horizontal Head Turns: Normal Gait with Vertical Head Turns: Normal Gait and Pivot Turn: Normal Step Over Obstacle: Normal Step Around Obstacles: Normal Steps: Mild Impairment Total Score: 23       Pertinent Vitals/Pain Pain Assessment: No/denies pain    Home Living Family/patient expects to be discharged to:: Private residence Living Arrangements: Children (son) Available Help at Discharge: Family;Available 24 hours/day Type of Home: House Home Access: Stairs to enter   [employer] of Steps: 1 Home Layout: One level Home Equipment: Walker - 2 wheels      Prior Function Level of Independence: Independent         Comments: Pt works at [employer], [employer], making biscuits     Hand Dominance   Dominant  Hand: Right    Extremity/Trunk Assessment   Upper Extremity Assessment Upper Extremity Assessment: Overall WFL for tasks assessed    Lower Extremity Assessment Lower Extremity Assessment: Overall WFL for tasks assessed    Cervical / Trunk Assessment Cervical / Trunk Assessment: Normal  Communication   Communication: No difficulties   Cognition Arousal/Alertness: Awake/alert Behavior During Therapy: WFL for tasks assessed/performed Overall Cognitive Status: Within Functional Limits for tasks assessed                                        General Comments General comments (skin integrity, edema, etc.): SpO2 90-98% on RA, DOE 3/4 during gait requiring PT cuing for standing rest breaks and breathing technique. HRmax observed 121 bpm    Exercises Other Exercises Other Exercises: Pursed lip breathing technique: in through nose, out through mouth with pursed lips on exhalation to provide back pressure to lungs and expell CO2 Other Exercises: Energy conservation: standing or seated rest breaks during ADLs and ambulation with dyspnea on exertion, before point of anxiety about shortness of breath   Assessment/Plan    PT Assessment Patent does not need any further PT services  PT Problem List         PT Treatment Interventions      PT Goals (Current goals can be found in the Care Plan section)  Acute Rehab PT Goals Patient Stated Goal: go home, return to work ASAP PT Goal Formulation: With patient Time For Goal Achievement: 04/25/20 Potential to Achieve Goals: Good    Frequency     Barriers to discharge        Co-evaluation               AM-PAC PT "6 Clicks" Mobility  Outcome Measure Help needed turning from your back to your side while in a flat bed without using bedrails?: None Help needed moving from lying on your back to sitting on the side of a flat bed without using bedrails?: None Help needed moving to and from a bed to a chair (including a wheelchair)?: None Help needed standing up from a chair using your arms (e.g., wheelchair or bedside chair)?: None Help needed to walk in hospital room?: None Help needed climbing 3-5 steps with a railing? : None 6 Click Score: 24    End of Session   Activity Tolerance: Patient tolerated treatment well Patient left: with call bell/phone  within reach;in chair Nurse Communication: Mobility status PT Visit Diagnosis: Other abnormalities of gait and mobility (R26.89)    Time: 8592-9244 PT Time Calculation (min) (ACUTE ONLY): 18 min   Charges:   PT Evaluation $PT Eval Low Complexity: 1 Low          Felipa Laroche E, PT Acute Rehabilitation Services Pager [phone]  Office [phone]   Quaid Yeakle D Lemya Greenwell 04/25/2020, 11:29 AM

## 2020-04-26 ENCOUNTER — Encounter (HOSPITAL_COMMUNITY): Payer: Self-pay

## 2020-04-26 DIAGNOSIS — B342 Coronavirus infection, unspecified: Secondary | ICD-10-CM

## 2020-04-26 LAB — BASIC METABOLIC PANEL
Anion gap: 9 (ref 5–15)
BUN: 29 mg/dL — ABNORMAL HIGH (ref 8–23)
CO2: 24 mmol/L (ref 22–32)
Calcium: 9.7 mg/dL (ref 8.9–10.3)
Chloride: 106 mmol/L (ref 98–111)
Creatinine, Ser: 1.59 mg/dL — ABNORMAL HIGH (ref 0.44–1.00)
GFR calc Af Amer: 37 mL/min — ABNORMAL LOW (ref >60)
GFR calc non Af Amer: 32 mL/min — ABNORMAL LOW (ref >60)
Glucose, Bld: 143 mg/dL — ABNORMAL HIGH (ref 70–99)
Potassium: 4.2 mmol/L (ref 3.5–5.1)
Sodium: 139 mmol/L (ref 135–145)

## 2020-04-26 LAB — CBC WITH DIFFERENTIAL/PLATELET
Abs Immature Granulocytes: 0.08 10*3/uL — ABNORMAL HIGH (ref 0.00–0.07)
Basophils Absolute: 0 10*3/uL (ref 0.0–0.1)
Basophils Relative: 0 %
Eosinophils Absolute: 0 10*3/uL (ref 0.0–0.5)
Eosinophils Relative: 0 %
HCT: 34.3 % — ABNORMAL LOW (ref 36.0–46.0)
Hemoglobin: 11.2 g/dL — ABNORMAL LOW (ref 12.0–15.0)
Immature Granulocytes: 1 %
Lymphocytes Relative: 8 %
Lymphs Abs: 0.7 10*3/uL (ref 0.7–4.0)
MCH: 30.4 pg (ref 26.0–34.0)
MCHC: 32.7 g/dL (ref 30.0–36.0)
MCV: 93 fL (ref 80.0–100.0)
Monocytes Absolute: 0.3 10*3/uL (ref 0.1–1.0)
Monocytes Relative: 4 %
Neutro Abs: 8.3 10*3/uL — ABNORMAL HIGH (ref 1.7–7.7)
Neutrophils Relative %: 87 %
Platelets: 271 10*3/uL (ref 150–400)
RBC: 3.69 MIL/uL — ABNORMAL LOW (ref 3.87–5.11)
RDW: 12.1 % (ref 11.5–15.5)
WBC: 9.4 10*3/uL (ref 4.0–10.5)
nRBC: 0 % (ref 0.0–0.2)

## 2020-04-26 MED ORDER — AZITHROMYCIN 500 MG PO TABS
500.0000 mg | ORAL_TABLET | Freq: Once | ORAL | 0 refills | Status: AC
Start: 2020-04-27 — End: 2020-04-27

## 2020-04-26 MED ORDER — PREDNISONE 10 MG PO TABS: ORAL_TABLET | ORAL | 0 refills | Status: AC

## 2020-04-26 MED ORDER — DM-GUAIFENESIN ER 30-600 MG PO TB12: 1.0000 | ORAL_TABLET | Freq: Two times a day (BID) | ORAL | 0 refills | Status: AC

## 2020-04-26 MED ORDER — ENOXAPARIN SODIUM 30 MG/0.3ML ~~LOC~~ SOLN: 30.0000 mg | SUBCUTANEOUS | Status: DC

## 2020-04-26 MED ORDER — IPRATROPIUM-ALBUTEROL 0.5-2.5 (3) MG/3ML IN SOLN
3.0000 mL | Freq: Three times a day (TID) | RESPIRATORY_TRACT | Status: DC
  Administered 2020-04-26: 3 mL via RESPIRATORY_TRACT
  Filled 2020-04-26 (×2): qty 3

## 2020-04-26 MED ORDER — PREDNISONE 20 MG PO TABS: 60.0000 mg | ORAL_TABLET | Freq: Every day | ORAL | Status: DC

## 2020-04-26 MED ORDER — IPRATROPIUM-ALBUTEROL 0.5-2.5 (3) MG/3ML IN SOLN: 3.0000 mL | Freq: Three times a day (TID) | RESPIRATORY_TRACT | 0 refills | Status: DC

## 2020-04-26 NOTE — Discharge Summary (Signed)
Discharge Summary  Katie [name] ZOX:[id number] DOB: 06/23/1946  PCP: Brunetta Jeans, PA-C  Admit date: 04/23/2020 Discharge date: 04/26/2020  Time spent: 40 mins   Recommendations for Outpatient Follow-up:  1. PCP in 1 week  Discharge Diagnoses:  Active Hospital Problems   Diagnosis Date Noted  . Acute respiratory failure with hypoxia ([facility]) 04/23/2020  . S/P minimally invasive mitral valve repair 04/20/2019  . Chronic diastolic congestive heart failure ([facility])   . COPD exacerbation ([facility]) 03/13/2019  . CKD (chronic kidney disease) stage 3, GFR 30-59 ml/min 09/29/2018  . Essential hypertension 09/26/2011    Resolved Hospital Problems  No resolved problems to display.    Discharge Condition: Stable  Diet recommendation: Heart healthy  Vitals:   04/26/20 1342 04/26/20 1400  BP: (!) 178/105 (!) 144/88  Pulse:    Resp:    Temp:    SpO2: 98%     History of present illness:  74 year old lady prior history of COPD, GERD, abdominal aortic aneurysm, history of renal cell carcinoma, moderate history s/p right-sided nephrectomy, hypertension, s/p mitral valve repair in May 2020, stage IIIa CKD, presents to ED for worsening shortness of breath associated with cough for the last 4 to 5 days. On admission chest x-ray does not show any pneumonia.  EKG does not show any ischemic changes.  BNP was elevated at 432 and troponin was negative.  COVID-19 screening test was negative.  Patient was admitted for acute COPD exacerbation.    Today, patient reports feeling much better, still with coughing, but denies any worsening shortness of breath, chest pain, abdominal pain, nausea/vomiting, fever/chills.  Patient has been ambulating the hallway, without any worsening shortness of breath, desaturation, dizziness.  Very eager to be discharged.  Advised patient to quit smoking, take her medications religiously and follow-up with her doctors.    Hospital Course:  Principal Problem:   Acute  respiratory failure with hypoxia (HCC) Active Problems:   Essential hypertension   CKD (chronic kidney disease) stage 3, GFR 30-59 ml/min   COPD exacerbation (HCC)   Chronic diastolic congestive heart failure (HCC)   S/P minimally invasive mitral valve repair   Acute respiratory failure with hypoxia 2/2 acute COPD exacerbation 2/2 coronovirus infection (not the novel covid-19) Patient currently on room air CT chest without contrast showed scattered nonspecific infectious or inflammatory groundglass opacity of the dependent lungs bilaterally, mild bronchial plugging throughout, emphysema Continue DuoNeb, inhalers S/P IV Solu-Medrol--> switch to tapered dose of prednisone over 12 days continue Continue azithromycin to complete 5 days of antibiotic Continue cough suppressant, flutter valve as needed Follow-up with PCP in 1 week  Elevated BNP with history of mitral valve regurgitation Possible chronic diastolic HF S/p mitral valve repair Troponin negative Echocardiogram showed EF of 60 to 65%, no regional wall motion abnormality, left ventricular diastolic parameters were indeterminate Follow-up with cardiology/PCP  CKD stage IIIb/right-sided nephrectomy status post renal cell carcinoma Creatinine at baseline Follow-up with PCP/nephrology  Essential hypertension Continue losartan, metoprolol  Abdominal aortic aneurysm measuring up to 4.0 x 4.0 cm Recommend outpatient follow-up with annual ultrasound Advised extensively to stop smoking cigarettes Follow-up closely with PCP/cardiology  Tobacco abuse Advised to quit Nicotine patch recommended       Malnutrition Type:      Malnutrition Characteristics:      Nutrition Interventions:      Estimated body mass index is 27.33 kg/m as calculated from the following:   Height as of this encounter: 5\' 1"  (1.549 m).   Weight  as of this encounter: 65.6 kg.    Procedures:  None  Consultations:  None  Discharge  Exam: BP (!) 144/88 (BP Location: Left Arm)   Pulse 74   Temp 98 F (36.7 C) (Oral)   Resp 16   Ht 5\' 1"  (1.549 m)   Wt 65.6 kg   SpO2 98%   BMI 27.33 kg/m   General: NAD Cardiovascular: S1, S2 present Respiratory: Diminished breath sounds bilaterally no wheezing noted    Discharge Instructions You were cared for by a hospitalist during your hospital stay. If you have any questions about your discharge medications or the care you received while you were in the hospital after you are discharged, you can call the unit and asked to speak with the hospitalist on call if the hospitalist that took care of you is not available. Once you are discharged, your primary care physician will handle any further medical issues. Please note that NO REFILLS for any discharge medications will be authorized once you are discharged, as it is imperative that you return to your primary care physician (or establish a relationship with a primary care physician if you do not have one) for your aftercare needs so that they can reassess your need for medications and monitor your lab values.  Discharge Instructions    Diet - low sodium heart healthy   Complete by: As directed    Increase activity slowly   Complete by: As directed      Allergies as of 04/26/2020      Reactions   Ace Inhibitors Other (See Comments)   Pseudoasthma/ renal failure       Medication List    TAKE these medications   acetaminophen 500 MG tablet Commonly known as: TYLENOL Take 1,000 mg by mouth every 6 (six) hours as needed for headache (pain).   albuterol (2.5 MG/3ML) 0.083% nebulizer solution Commonly known as: PROVENTIL Take 3 mLs (2.5 mg total) by nebulization every 6 (six) hours as needed for wheezing or shortness of breath.   albuterol 108 (90 Base) MCG/ACT inhaler Commonly known as: VENTOLIN HFA Inhale 2 puffs into the lungs every 6 (six) hours as needed for wheezing or shortness of breath.   aspirin EC 81 MG  tablet Take 1 tablet (81 mg total) by mouth daily.   atorvastatin 40 MG tablet Commonly known as: LIPITOR TAKE 1 TABLET BY MOUTH ONCE DAILY AT  6  PM What changed: See the new instructions.   azithromycin 500 MG tablet Commonly known as: Zithromax Take 1 tablet (500 mg total) by mouth once for 1 dose. Start taking on: April 27, 2020   dextromethorphan-guaiFENesin 30-600 MG 12hr tablet Commonly known as: MUCINEX DM Take 1 tablet by mouth 2 (two) times daily for 7 days.   Dulera 200-5 MCG/ACT Aero Generic drug: mometasone-formoterol Inhale 2 puffs into the lungs 2 (two) times daily.   ipratropium-albuterol 0.5-2.5 (3) MG/3ML Soln Commonly known as: DUONEB Take 3 mLs by nebulization 3 (three) times daily for 7 days.   losartan 50 MG tablet Commonly known as: COZAAR Take 1 tablet (50 mg total) by mouth daily.   metoprolol tartrate 25 MG tablet Commonly known as: LOPRESSOR Take 1 tablet (25 mg total) by mouth 2 (two) times daily.   multivitamin with minerals Tabs tablet Take 1 tablet by mouth daily.   predniSONE 10 MG tablet Commonly known as: DELTASONE Take 6 tablets (60 mg total) by mouth daily with breakfast for 2 days, THEN 5 tablets (50  mg total) daily with breakfast for 2 days, THEN 4 tablets (40 mg total) daily with breakfast for 2 days, THEN 3 tablets (30 mg total) daily with breakfast for 2 days, THEN 2 tablets (20 mg total) daily with breakfast for 2 days, THEN 1 tablet (10 mg total) daily with breakfast for 2 days. Start taking on: April 26, 2020   Pulse Oximeter For Finger Misc 1 Device by Does not apply route as needed.   triamcinolone 55 MCG/ACT Aero nasal inhaler Commonly known as: NASACORT Place 2 sprays into the nose daily. What changed:   when to take this  reasons to take this      Allergies  Allergen Reactions  . Ace Inhibitors Other (See Comments)    Pseudoasthma/ renal failure     Follow-up Information    Brunetta Jeans, PA-C. Schedule an  appointment as soon as possible for a visit in 1 week(s).   Specialty: Family Medicine Contact information: [address] [place] [address] [phone]        Skeet Latch, MD .   Specialty: Cardiology Contact information: [address] [place] [address] [phone]                The results of significant diagnostics from this hospitalization (including imaging, microbiology, ancillary and laboratory) are listed below for reference.    Significant Diagnostic Studies: CT CHEST WO CONTRAST  Result Date: 04/24/2020 CLINICAL DATA:  Chest pain, shortness of breath EXAM: CT CHEST WITHOUT CONTRAST TECHNIQUE: Multidetector CT imaging of the chest was performed following the standard protocol without IV contrast. COMPARISON:  Chest radiographs, 04/23/2020, CT angiogram abdomen and pelvis, 03/29/2019 FINDINGS: Cardiovascular: Aortic atherosclerosis. Normal heart size. Scattered coronary artery calcifications. Mitral valve prosthesis. Enlargement of the main pulmonary artery measuring up to 3.7 cm. No pericardial effusion. Mediastinum/Nodes: No enlarged mediastinal, hilar, or axillary lymph nodes. Thyroid gland, trachea, and esophagus demonstrate no significant findings. Lungs/Pleura: Mild centrilobular and paraseptal emphysema. Scattered nonspecific ground-glass opacities of the dependent lungs bilaterally, more conspicuous on the left (series 4, image 61, 111). Mild bronchial plugging throughout. No pleural effusion or pneumothorax. Upper Abdomen: No acute abnormality. Status post right nephrectomy and adrenalectomy, partially imaged. Abdominal aortic aneurysm measuring up to 4.0 x 4.0 cm. Benign cyst or hemangioma of the liver dome. Musculoskeletal: No chest wall mass or suspicious bone lesions identified. IMPRESSION: 1. Scattered nonspecific infectious or inflammatory ground-glass opacities of the dependent lungs bilaterally, more conspicuous on the left. 2.  Mild bronchial plugging throughout, consistent with nonspecific infectious or inflammatory bronchitis. 3. Emphysema (ICD10-J43.9). 4. Coronary artery disease. 5. Enlargement of the main pulmonary artery, as can be seen in pulmonary hypertension. 6. Aortic Atherosclerosis (ICD10-I70.0). 7. Abdominal aortic aneurysm measuring up to 4.0 x 4.0 cm, better characterized by prior CT angiogram of the abdomen and pelvis. Electronically Signed   By: Eddie Candle M.D.   On: 04/24/2020 10:42   DG Chest Portable 1 View  Result Date: 04/23/2020 CLINICAL DATA:  Shortness of breath. EXAM: PORTABLE CHEST 1 VIEW COMPARISON:  None. FINDINGS: Very mild atelectasis is seen within the bilateral lung bases. There is no evidence of a pleural effusion or pneumothorax. The heart size and mediastinal contours are within normal limits. An artificial cardiac valve is noted. The visualized skeletal structures are unremarkable. Radiopaque surgical clips are seen in the right upper quadrant. IMPRESSION: Very mild bibasilar atelectasis. Electronically Signed   By: Virgina Norfolk M.D.   On: 04/23/2020  18:02   ECHOCARDIOGRAM COMPLETE  Result Date: 04/24/2020    ECHOCARDIOGRAM REPORT   Patient Name:   Katie [name] Date of Exam: 04/24/2020 Medical Rec #:  [id number]       Height:       61.0 in Accession #:    [id number]      Weight:       144.6 lb Date of Birth:  1946/08/08       BSA:          1.646 m Patient Age:    65 years        BP:           129/90 mmHg Patient Gender: F               HR:           87 bpm. Exam Location:  Inpatient Procedure: 2D Echo Indications:    dyspnea  History:        Patient has prior history of Echocardiogram examinations, most                 recent 04/21/2019. CHF, COPD; Risk Factors:Hypertension and                 Current Smoker. Mitral valve repair.                  Mitral Valve: prosthetic annuloplasty ring valve is present in                 the mitral position.  Sonographer:    Jannett Celestine RDCS (AE)  Referring Phys: [place]  1. Technically difficult; normal LV systolic function; elevated LV filling pressure; s/p MV repair with mild to moderate MS (MVA 1.9 cm2; mean gradient 10 mmHg) and mild MR; probable elevated LVOT gradient (not well interrogated but possibly 3 m/s; suggest TEE to further assess if clinically indicated).  2. Left ventricular ejection fraction, by estimation, is 60 to 65%. The left ventricle has normal function. The left ventricle has no regional wall motion abnormalities. There is mild left ventricular hypertrophy. Left ventricular diastolic parameters are indeterminate. Elevated left atrial pressure.  3. Right ventricular systolic function is normal. The right ventricular size is normal.  4. The mitral valve has been repaired/replaced. Mild mitral valve regurgitation. Mild to moderate mitral stenosis. There is a prosthetic annuloplasty ring present in the mitral position.  5. The aortic valve has an indeterminant number of cusps. Aortic valve regurgitation is not visualized. No aortic stenosis is present.  6. The inferior vena cava is normal in size with greater than 50% respiratory variability, suggesting right atrial pressure of 3 mmHg. FINDINGS  Left Ventricle: Left ventricular ejection fraction, by estimation, is 60 to 65%. The left ventricle has normal function. The left ventricle has no regional wall motion abnormalities. The left ventricular internal cavity size was normal in size. There is  mild left ventricular hypertrophy. Left ventricular diastolic parameters are indeterminate. Elevated left atrial pressure. Right Ventricle: The right ventricular size is normal.Right ventricular systolic function is normal. Left Atrium: Left atrial size was normal in size. Right Atrium: Right atrial size was normal in size. Pericardium: There is no evidence of pericardial effusion. Mitral Valve: The mitral valve has been repaired/replaced. Normal mobility of the mitral  valve leaflets. Mild mitral valve regurgitation. There is a prosthetic annuloplasty ring present in the mitral position. Mild to moderate mitral valve stenosis. MV peak gradient, 17.5 mmHg. The mean mitral  valve gradient is 10.0 mmHg. Tricuspid Valve: The tricuspid valve is normal in structure. Tricuspid valve regurgitation is trivial. No evidence of tricuspid stenosis. Aortic Valve: The aortic valve has an indeterminant number of cusps. Aortic valve regurgitation is not visualized. No aortic stenosis is present. Pulmonic Valve: The pulmonic valve was not well visualized. Pulmonic valve regurgitation is not visualized. No evidence of pulmonic stenosis. Aorta: The aortic root is normal in size and structure. Venous: The inferior vena cava is normal in size with greater than 50% respiratory variability, suggesting right atrial pressure of 3 mmHg. IAS/Shunts: No atrial level shunt detected by color flow Doppler. Additional Comments: Technically difficult; normal LV systolic function; elevated LV filling pressure; s/p MV repair with mild to moderate MS (MVA 1.9 cm2; mean gradient 10 mmHg) and mild MR; probable elevated LVOT gradient (not well interrogated but possibly 3 m/s; suggest TEE to further assess if clinically indicated).  LEFT VENTRICLE PLAX 2D LVIDd:         3.20 cm  Diastology LVIDs:         2.30 cm  LV e' lateral:   6.64 cm/s LV PW:         0.90 cm  LV E/e' lateral: 28.8 LV IVS:        1.20 cm  LV e' medial:    5.77 cm/s LVOT diam:     2.20 cm  LV E/e' medial:  33.1 LV SV:         72 LV SV Index:   44 LVOT Area:     3.80 cm  LEFT ATRIUM             Index LA diam:        3.50 cm 2.13 cm/m LA Vol (A2C):   19.9 ml 12.09 ml/m LA Vol (A4C):   26.4 ml 16.04 ml/m LA Biplane Vol: 24.8 ml 15.07 ml/m  AORTIC VALVE LVOT Vmax:   114.00 cm/s LVOT Vmean:  82.100 cm/s LVOT VTI:    0.190 m  AORTA Ao Root diam: 3.10 cm MITRAL VALVE MV Area (PHT): 1.94 cm     SHUNTS MV Peak grad:  17.5 mmHg    Systemic VTI:  0.19 m MV  Mean grad:  10.0 mmHg    Systemic Diam: 2.20 cm MV Vmax:       2.09 m/s MV Vmean:      149.0 cm/s MV Decel Time: 391 msec MV E velocity: 191.00 cm/s MV A velocity: 190.00 cm/s MV E/A ratio:  1.01 Kirk Ruths MD Electronically signed by Kirk Ruths MD Signature Date/Time: 04/24/2020/1:39:22 PM    Final     Microbiology: Recent Results (from the past 240 hour(s))  SARS Coronavirus 2 by RT PCR (hospital order, performed in [facility] hospital lab) Nasopharyngeal Nasopharyngeal Swab     Status: None   Collection Time: 04/23/20  7:57 PM   Specimen: Nasopharyngeal Swab  Result Value Ref Range Status   SARS Coronavirus 2 NEGATIVE NEGATIVE Final    Comment: (NOTE) SARS-CoV-2 target nucleic acids are NOT DETECTED.  The SARS-CoV-2 RNA is generally detectable in upper and lower respiratory specimens during the acute phase of infection. The lowest concentration of SARS-CoV-2 viral copies this assay can detect is 250 copies / mL. A negative result does not preclude SARS-CoV-2 infection and should not be used as the sole basis for treatment or other patient management decisions.  A negative result may occur with improper specimen collection / handling, submission of specimen other than nasopharyngeal  swab, presence of viral mutation(s) within the areas targeted by this assay, and inadequate number of viral copies (<250 copies / mL). A negative result must be combined with clinical observations, patient history, and epidemiological information.  Fact Sheet for Patients:   StrictlyIdeas.no  Fact Sheet for Healthcare Providers: BankingDealers.co.za  This test is not yet approved or  cleared by the [place] FDA and has been authorized for detection and/or diagnosis of SARS-CoV-2 by FDA under an Emergency Use Authorization (EUA).  This EUA will remain in effect (meaning this test can be used) for the duration of the COVID-19 declaration under  Section 564(b)(1) of the Act, 21 U.S.C. section 360bbb-3(b)(1), unless the authorization is terminated or revoked sooner.  Performed at [facility], [place] [address] [place], [place] [address]   Respiratory Panel by PCR     Status: Abnormal   Collection Time: 04/25/20 10:08 AM   Specimen: Nasopharyngeal Swab; Respiratory  Result Value Ref Range Status   Adenovirus NOT DETECTED NOT DETECTED Final   Coronavirus 229E NOT DETECTED NOT DETECTED Final    Comment: (NOTE) The Coronavirus on the Respiratory Panel, DOES NOT test for the novel  Coronavirus (2019 nCoV)    Coronavirus HKU1 NOT DETECTED NOT DETECTED Final   Coronavirus NL63 NOT DETECTED NOT DETECTED Final   Coronavirus OC43 DETECTED (A) NOT DETECTED Final   Metapneumovirus NOT DETECTED NOT DETECTED Final   Rhinovirus / Enterovirus NOT DETECTED NOT DETECTED Final   Influenza A NOT DETECTED NOT DETECTED Final   Influenza B NOT DETECTED NOT DETECTED Final   Parainfluenza Virus 1 NOT DETECTED NOT DETECTED Final   Parainfluenza Virus 2 NOT DETECTED NOT DETECTED Final   Parainfluenza Virus 3 NOT DETECTED NOT DETECTED Final   Parainfluenza Virus 4 NOT DETECTED NOT DETECTED Final   Respiratory Syncytial Virus NOT DETECTED NOT DETECTED Final   Bordetella pertussis NOT DETECTED NOT DETECTED Final   Chlamydophila pneumoniae NOT DETECTED NOT DETECTED Final   Mycoplasma pneumoniae NOT DETECTED NOT DETECTED Final    Comment: Performed at [facility], [place]. [address] [place], [place] [address]     Labs: Basic Metabolic Panel: Recent Labs  Lab 04/23/20 1423 04/23/20 1753 04/24/20 0432 04/26/20 0246  NA 140 142 141 139  K 4.0 4.1 4.4 4.2  CL 102  --  105 106  CO2 28  --  25 24  GLUCOSE 122*  --  163* 143*  BUN 14  --  16 29*  CREATININE 1.55*  --  1.43* 1.59*  CALCIUM 9.3  --  9.6 9.7   Liver Function Tests: No results for input(s): AST, ALT, ALKPHOS, BILITOT, PROT, ALBUMIN in the last 168 hours. No results for  input(s): LIPASE, AMYLASE in the last 168 hours. No results for input(s): AMMONIA in the last 168 hours. CBC: Recent Labs  Lab 04/23/20 1423 04/23/20 1753 04/24/20 0432 04/26/20 0246  WBC 5.4  --  3.7* 9.4  NEUTROABS  --   --   --  8.3*  HGB 12.1 12.9 11.7* 11.2*  HCT 38.5 38.0 36.8 34.3*  MCV 93.2  --  92.5 93.0  PLT 250  --  235 271   Cardiac Enzymes: No results for input(s): CKTOTAL, CKMB, CKMBINDEX, TROPONINI in the last 168 hours. BNP: BNP (last 3 results) Recent Labs    04/23/20 1740  BNP 432.2*    ProBNP (last 3 results) No results for input(s): PROBNP in the last 8760 hours.  CBG: No results  for input(s): GLUCAP in the last 168 hours.     Signed:  Alma Friendly, MD [facility] 04/26/2020, 3:46 PM

## 2020-04-27 ENCOUNTER — Telehealth: Payer: Self-pay | Admitting: Physician Assistant

## 2020-04-27 NOTE — Telephone Encounter (Signed)
Patient was seen in ED  Nurse Assessment Nurse: Clovis Riley, RN, Georgina Peer Date/Time Eilene Ghazi Time): 04/22/2020 10:04:54 AM Confirm and document reason for call. If symptomatic, describe symptoms. ---Caller states her breathing has gotten bad, coughing, chest tightness and she has COPD that started on friday. Caller states she is wheezing and she needs Prednisone being she has taking her inhalers and alka-seltzer plus. States when she coughs she gets lightheaded. States chest feels tight. Clear mucous. Temp is 100 this morning. Has the patient had close contact with a person known or suspected to have the novel coronavirus illness OR traveled / lives in area with major community spread (including international travel) in the last 14 days from the onset of symptoms? * If Asymptomatic, screen for exposure and travel within the last 14 days. ---No Does the patient have any new or worsening symptoms? ---Yes Will a triage be completed? ---Yes Related visit to physician within the last 2 weeks? ---No Does the PT have any chronic conditions? (i.e. diabetes, asthma, this includes High risk factors for pregnancy, etc.) ---Yes List chronic conditions. ---copd, heart disease Is this a behavioral health or substance abuse call? ---NoPLEASE NOTE: All timestamps contained within this report are represented as [place] Standard Time. CONFIDENTIALTY NOTICE: This fax transmission is intended only for the addressee. It contains information that is legally privileged, confidential or otherwise protected from use or disclosure. If you are not the intended recipient, you are strictly prohibited from reviewing, disclosing, copying using or disseminating any of this information or taking any action in reliance on or regarding this information. If you have received this fax in error, please notify us immediately by telephone so that we can arrange for its return to [place]. Phone: [phone], Toll-Free: [phone], Fax:  [phone] Page: 2 of 2 Call Id: [id number] Guidelines Guideline Title Affirmed Question Affirmed Notes Nurse Date/Time Eilene Ghazi Time) COPD Oxygen Monitoring and Hypoxia [1] MODERATE difficulty breathing (e.g., speaks in phrases, SOB even at rest) AND [2] worse than normal [place], RN, Georgina Peer 04/22/2020 10:07:00 AM Disp. Time Eilene Ghazi Time) Disposition Final User 04/22/2020 10:03:29 AM Send to Urgent Queue Josephine Cables 04/22/2020 10:08:16 AM Go to ED Now (or PCP triage) Yes Clovis Riley, RN, Leilani Merl Disagree/Comply Comply Caller Understands Yes PreDisposition [place] Advice Given Per Guideline GO TO ED NOW (OR PCP TRIAGE): BRING MEDICINES: * Please bring a list of your current medicines when you go to the Emergency Department (ER). * It is also a good idea to bring the pill bottles too. This will help the doctor to make certain you are taking the right medicines and the right dose. CARE ADVICE given per COPD Oxygen Monitoring and Hypoxia (Adult) guideline. Referrals GO TO FACILITY OTHER - SPECIFY

## 2020-04-27 NOTE — Telephone Encounter (Signed)
This was a message from Sunday 04/22/20. Patient did go to the ED and evaluated. Patient has follow up appointment on 05/01/20

## 2020-04-30 ENCOUNTER — Telehealth: Payer: Self-pay

## 2020-04-30 NOTE — Telephone Encounter (Signed)
Transition Care Management Follow-up Telephone Call  Date of discharge and from where: [facility]  How have you been since you were released from the hospital? Btter but still feeling weak & coughing.  Any questions or concerns? No   Items Reviewed:  Did the pt receive and understand the discharge instructions provided? Yes   Medications obtained and verified? Yes   Any new allergies since your discharge? No   Dietary orders reviewed? No  Do you have support at home? Yes   Functional Questionnaire: (I = Independent and D = Dependent) ADLs: I  Bathing/Dressing- I  Meal Prep- I  Eating- I  Maintaining continence- I  Transferring/Ambulation- I  Managing Meds- I  Follow up appointments reviewed:   PCP Hospital f/u appt confirmed? Yes  Scheduled to see [name] on 05/04/2020 @ [facility] f/u appt confirmed? N/A  Are transportation arrangements needed? No   If their condition worsens, is the pt aware to call PCP or go to the Emergency Dept.? Yes  Was the patient provided with contact information for the PCP's office or ED? Yes  Was to pt encouraged to call back with questions or concerns? Yes

## 2020-05-01 ENCOUNTER — Other Ambulatory Visit: Payer: Self-pay

## 2020-05-01 ENCOUNTER — Ambulatory Visit: Payer: Medicare Other

## 2020-05-01 DIAGNOSIS — M810 Age-related osteoporosis without current pathological fracture: Secondary | ICD-10-CM

## 2020-05-01 DIAGNOSIS — E785 Hyperlipidemia, unspecified: Secondary | ICD-10-CM

## 2020-05-01 DIAGNOSIS — F418 Other specified anxiety disorders: Secondary | ICD-10-CM

## 2020-05-01 DIAGNOSIS — E559 Vitamin D deficiency, unspecified: Secondary | ICD-10-CM

## 2020-05-01 DIAGNOSIS — I10 Essential (primary) hypertension: Secondary | ICD-10-CM

## 2020-05-01 DIAGNOSIS — J449 Chronic obstructive pulmonary disease, unspecified: Secondary | ICD-10-CM

## 2020-05-01 NOTE — Patient Instructions (Addendum)
Please call me at [phone] (direct line) with any questions - thank you!  - Edyth Gunnels., Clinical Pharmacist  Goals Addressed            This Visit's Progress   . PharmD Care Plan       CARE PLAN ENTRY (see longitudinal plan of care for additional care plan information)  Current Barriers:  . Chronic Disease Management support, education, and care coordination needs related to Hypertension, Hyperlipidemia, COPD, Depression, and Osteoporosis   COPD . Pharmacist Clinical Goal(s) o Over the next 180 days, patient will work with PharmD and providers to ensure safe and effective medication management of COPD . Current regimen:   Dulera 2 puffs twice daily  Albuterol rescue inhaler 2 puffs every 6 hours as needed  Albuterol (PROVENTIL) (2.5 MG/3ML) 0.083% nebulizer solution 3 mLs every 6 hours as needed . Interventions: o Referral to pulmonology recommended o With help with any needed patient assistance . Patient self care activities - Over the next 180 days, patient will: o Follow-up on pulmonology referral  o Call with any questions   Hypertension  BP Readings from Last 3 Encounters:  04/26/20 (!) 144/88  02/08/20 138/88  10/31/19 (!) 138/98   . Pharmacist Clinical Goal(s): o Over the next 180 days, patient will work with PharmD and providers to maintain BP goal <140/90 . Current regimen:   Metoprolol 25 mg twice daily  Losartan 50 mg daily  . Interventions: o Home monitoring plan . Patient self care activities - Over the next 7 days, patient will: o Check BP 3 times daily, document, and provide at future appointments o Ensure daily salt intake < 2300 mg/day  Hyperlipidemia Lab Results  Component Value Date/Time   LDLCALC 115 (H) 09/29/2018 02:30 AM   LDLDIRECT 151.0 10/31/2019 02:32 PM   . Pharmacist Clinical Goal(s): o Over the next 180 days, patient will work with PharmD and providers to achieve LDL goal < 100 . Current regimen:  o Atorvastatin 40 mg daily   . Interventions: o Dietary handout provided . Patient self care activities - Over the next 180 days, patient will: o Continue current management o Please let me know if you are having any issues taking atorvastatin every day  Osteoporosis . Pharmacist Clinical Goal(s) o Over the next 180 days, patient will work with PharmD and providers to ensure safe and effective therapy to loss progression of osteoporosis and reduce fracture risk . Current regimen:  o N/a . Interventions: o Ensure optimal intake of calcium/vitamin d -  1200 mg of calcium (total of diet and supplement) and 800 international units of vitamin D daily for most postmenopausal women o Patient assistance applications as appropriate  . Patient self care activities - Over the next 180 days, patient will: o Call me with any questions   Depression . Pharmacist Clinical Goal(s) o Over the next 90 days, patient will work with PharmD and providers to minimize symptoms of depression . Current regimen:  o No current therapy . Interventions: o Recommend starting on antidepressant - will be discussed at follow-up visit with PCP . Patient self care activities - Over the next 180 days, patient will: o Call with any questions  Medication management . Pharmacist Clinical Goal(s): o Over the next 180 days, patient will work with PharmD and providers to achieve optimal medication adherence . Current pharmacy: [facility] . Interventions o Comprehensive medication review performed. o Utilize [facility] pharmacy for medication synchronization, packaging and delivery . Patient self care activities -  Over the next 180 days, patient will: o Take medications as prescribed o Report any questions or concerns to PharmD and/or provider(s)  Initial goal documentation.      Ms. Bottger was given information about Chronic Care Management services today including:  1. CCM service includes personalized support from designated clinical staff  supervised by her physician, including individualized plan of care and coordination with other care providers 2. 24/7 contact phone numbers for assistance for urgent and routine care needs. 3. Standard insurance, coinsurance, copays and deductibles apply for chronic care management only during months in which we provide at least 20 minutes of these services. Most insurances cover these services at 100%, however patients may be responsible for any copay, coinsurance and/or deductible if applicable. This service may help you avoid the need for more expensive face-to-face services. 4. Only one practitioner may furnish and bill the service in a calendar month. 5. The patient may stop CCM services at any time (effective at the end of the month) by phone call to the office staff.  Patient agreed to services and verbal consent obtained.   The patient verbalized understanding of instructions provided today and agreed to receive a mailed copy of patient instruction and/or educational materials. Telephone follow up appointment with pharmacy team member scheduled for: 1 month  Thank you!  Madelin Rear, Pharm.D., BCGP Clinical Pharmacist [facility] [phone]  High Cholesterol  High cholesterol is a condition in which the blood has high levels of a white, waxy, fat-like substance (cholesterol). The human body needs small amounts of cholesterol. The liver makes all the cholesterol that the body needs. Extra (excess) cholesterol comes from the food that we eat. Cholesterol is carried from the liver by the blood through the blood vessels. If you have high cholesterol, deposits (plaques) may build up on the walls of your blood vessels (arteries). Plaques make the arteries narrower and stiffer. Cholesterol plaques increase your risk for heart attack and stroke. Work with your health care provider to keep your cholesterol levels in a healthy range. What increases the  risk? This condition is more likely to develop in people who:  Eat foods that are high in animal fat (saturated fat) or cholesterol.  Are overweight.  Are not getting enough exercise.  Have a family history of high cholesterol. What are the signs or symptoms? There are no symptoms of this condition. How is this diagnosed? This condition may be diagnosed from the results of a blood test.  If you are older than age 17, your health care provider may check your cholesterol every 4-6 years.  You may be checked more often if you already have high cholesterol or other risk factors for heart disease. The blood test for cholesterol measures:  "Bad" cholesterol (LDL cholesterol). This is the main type of cholesterol that causes heart disease. The desired level for LDL is less than 100.  "Good" cholesterol (HDL cholesterol). This type helps to protect against heart disease by cleaning the arteries and carrying the LDL away. The desired level for HDL is 60 or higher.  Triglycerides. These are fats that the body can store or burn for energy. The desired number for triglycerides is lower than 150.  Total cholesterol. This is a measure of the total amount of cholesterol in your blood, including LDL cholesterol, HDL cholesterol, and triglycerides. A healthy number is less than 200. How is this treated? This condition is treated with diet changes, lifestyle changes, and medicines. Diet  changes  This may include eating more whole grains, fruits, vegetables, nuts, and fish.  This may also include cutting back on red meat and foods that have a lot of added sugar. Lifestyle changes  Changes may include getting at least 40 minutes of aerobic exercise 3 times a week. Aerobic exercises include walking, biking, and swimming. Aerobic exercise along with a healthy diet can help you maintain a healthy weight.  Changes may also include quitting smoking. Medicines  Medicines are usually given if diet and  lifestyle changes have failed to reduce your cholesterol to healthy levels.  Your health care provider may prescribe a statin medicine. Statin medicines have been shown to reduce cholesterol, which can reduce the risk of heart disease. Follow these instructions at home: Eating and drinking If told by your health care provider:  Eat chicken (without skin), fish, veal, shellfish, ground [place] breast, and round or loin cuts of red meat.  Do not eat fried foods or fatty meats, such as hot dogs and salami.  Eat plenty of fruits, such as apples.  Eat plenty of vegetables, such as broccoli, potatoes, and carrots.  Eat beans, peas, and lentils.  Eat grains such as barley, rice, couscous, and bulgur wheat.  Eat pasta without cream sauces.  Use skim or nonfat milk, and eat low-fat or nonfat yogurt and cheeses.  Do not eat or drink whole milk, cream, ice cream, egg yolks, or hard cheeses.  Do not eat stick margarine or tub margarines that contain trans fats (also called partially hydrogenated oils).  Do not eat saturated tropical oils, such as coconut oil and palm oil.  Do not eat cakes, cookies, crackers, or other baked goods that contain trans fats.  General instructions  Exercise as directed by your health care provider. Increase your activity level with activities such as gardening, walking, and taking the stairs.  Take over-the-counter and prescription medicines only as told by your health care provider.  Do not use any products that contain nicotine or tobacco, such as cigarettes and e-cigarettes. If you need help quitting, ask your health care provider.  Keep all follow-up visits as told by your health care provider. This is important. Contact a health care provider if:  You are struggling to maintain a healthy diet or weight.  You need help to start on an exercise program.  You need help to stop smoking. Get help right away if:  You have chest pain.  You have trouble  breathing. This information is not intended to replace advice given to you by your health care provider. Make sure you discuss any questions you have with your health care provider. Document Revised: 10/09/2017 Document Reviewed: 04/05/2016 Elsevier Patient Education  [place].

## 2020-05-01 NOTE — Progress Notes (Signed)
Chronic Care Management Pharmacy  Name: Katie [name]  MRN: [id number] DOB: 08/05/1946  Chief Complaint/ HPI  Katie [name],  74 y.o. , female presents for their Initial CCM visit with the clinical pharmacist via telephone due to COVID-19 Pandemic.  Recent hospitalization for COPD exacerbation from 04/23/2020 to 04/26/2020. Currently on prednisone taper. Reports ongoing cough and shortness of breath. Reports daily use of albuterol inhaler (1-2 puff(s) per day), dulera, and albuterol neb. States PFTs have previously caused her discomfort causing patient to miss consultation with pulmonology. After recent hospitalization expresses willingness to be seen by pulmonology.   Reports recent fluctuations in BP and intermittent headaches. Today home reading is 142/92. Denies chest pain or dizziness.   Did not tolerate maintenance dose of chantix which was restarted 01/2020. Has cut down smoking from 1 pack per day to 1 cigarette or less/day.   Reports difficulty leaving the house as often as she wishes due to COPD/SOB.    PCP : Brunetta Jeans, PA-C  Their chronic conditions include: Encounter Diagnoses  Name Primary?  Marland Kitchen COPD Yes  . Age-related osteoporosis without current pathological fracture   . Essential hypertension   . Depression with anxiety   . Hyperlipidemia, unspecified hyperlipidemia type   . Vitamin D deficiency    Office Visits: 04/29/2020 ([facility]): TCM call 04/23/2020 ([facility]): COPD exacerbation - acute respiratory failure with hypoxia.  02/08/2020 (PCP): Given rescue inhaler, decided to stop amlodipine, start back on losartan at lower dose - losartan 25 mg daily. Continued with metoprolol tartrate 25mg  twice daily. Taking Dulera twice weekly, asked to take 2 puffs twice daily. Referred to pulmonology.  11/22/2019 (PCP): advised D3 1000 units daily and ergo 50,000 units weekly x 10. Atorvastatin restarted.  07/18/2019 (Dr. Roxy Manns, Cardiothoracic Surgery): Would be reasonable to stop  Warfarin, if agreed by Dr. Oval Linsey following echocariogram.   Patient Active Problem List   Diagnosis Date Noted  . Acute respiratory failure with hypoxia ([place]) 04/23/2020  . Atrial fibrillation ([place]) 04/26/2019  . S/P minimally invasive mitral valve repair 04/20/2019  . Chronic diastolic congestive heart failure ([place])   . Pneumonia 04/04/2019  . Tobacco abuse 03/13/2019  . COPD with acute exacerbation ([place]) 03/13/2019  . Acute respiratory failure with hypoxia and hypercapnia ([place]) 03/13/2019  . COPD exacerbation ([place]) 03/13/2019  . Fx metatarsal 03/07/2019  . CKD (chronic kidney disease) stage 3, GFR 30-59 ml/min 09/29/2018  . Urinary urgency 06/11/2018  . Visit for preventive health examination 04/30/2017  . Osteoporosis 12/31/2016  . Arthralgia of both hands 11/11/2016  . Breast cancer screening 11/11/2016  . Chest pain 04/17/2016  . Hyperglycemia 08/03/2015  . Esophageal reflux   . Depression with anxiety   . Tobacco abuse disorder 06/27/2014  . Insomnia 07/15/2013  . Severe mitral regurgitation 09/29/2011  . COPD 09/26/2011  . Essential hypertension 09/26/2011  . HLD (hyperlipidemia) 09/26/2011  . S/P cholecystectomy 09/26/2011   Past Surgical History:  Procedure Laterality Date  . APPENDECTOMY  1970's  . BUBBLE STUDY  03/18/2019   Procedure: BUBBLE STUDY;  Surgeon: Fay Records, MD;  Location: [facility];  Service: Cardiovascular;;  . CARDIAC CATHETERIZATION  09/2011  . CHOLECYSTECTOMY  01/2010  . DIAGNOSTIC LAPAROSCOPY    . KIDNEY SURGERY    . LEFT AND RIGHT HEART CATHETERIZATION WITH CORONARY ANGIOGRAM N/A 09/30/2011   Procedure: LEFT AND RIGHT HEART CATHETERIZATION WITH CORONARY ANGIOGRAM;  Surgeon: Candee Furbish, MD;  Location[facility] CATH LAB;  Service: Cardiovascular;  Laterality: N/A;  . MITRAL  VALVE REPAIR Right 04/20/2019   Procedure: MINIMALLY INVASIVE MITRAL VALVE REPAIR (MVR) USING MEMO 4D SIZE 30;  Surgeon: Rexene Alberts, MD;  Location: [facility];  Service:  Open Heart Surgery;  Laterality: Right;  . MULTIPLE EXTRACTIONS WITH ALVEOLOPLASTY  10/06/2011   Procedure: MULTIPLE EXTRACION WITH ALVEOLOPLASTY;  Surgeon: Lenn Cal, DDS;  Location: [facility];  Service: Oral Surgery;  Laterality: N/A;  Multiple extraction of tooth #'s 1, 6, 8, 10, 11, 22, 23, 26, 27, 28, 29 with alveoloplasty and Upper right buccal exostoses reductions.  . NEPHRECTOMY RADICAL  01/2010   right   . OVARIAN CYST REMOVAL  1970's   "went through belly button"  . RIGHT/LEFT HEART CATH AND CORONARY ANGIOGRAPHY N/A 03/22/2019   Procedure: RIGHT/LEFT HEART CATH AND CORONARY ANGIOGRAPHY;  Surgeon: [name], MD;  Location: [facility] CV LAB;  Service: Cardiovascular;  Laterality: N/A;  . TEE WITHOUT CARDIOVERSION  10/01/2011   Procedure: TRANSESOPHAGEAL ECHOCARDIOGRAM (TEE);  Surgeon: [name];  Location: [facility];  Service: Cardiovascular;  Laterality: N/A;  . TEE WITHOUT CARDIOVERSION N/A 03/18/2019   Procedure: TRANSESOPHAGEAL ECHOCARDIOGRAM (TEE);  Surgeon: Fay Records, MD;  Location: [facility];  Service: Cardiovascular;  Laterality: N/A;  . TEE WITHOUT CARDIOVERSION N/A 04/20/2019   Procedure: TRANSESOPHAGEAL ECHOCARDIOGRAM (TEE);  Surgeon: Rexene Alberts, MD;  Location: [facility];  Service: Open Heart Surgery;  Laterality: N/A;  . TUBAL LIGATION  1970's  . US ECHOCARDIOGRAPHY  09/2011   Social History   Socioeconomic History  . Marital status: Married    Spouse name: Not on file  . Number of children: 3  . Years of education: 33  . Highest education level: Not on file  Occupational History  . 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 Smoking status: Current Some Day Smoker    Packs/day: 1.00    Years: 53.00    Pack years: 53.00    Types: Cigarettes    Last attempt to quit: 03/18/2019    Years since quitting: 1.1  . Smokeless tobacco: Never Used  Vaping Use  .  Vaping Use: Never used  Substance and Sexual Activity  . Alcohol use: Yes    Alcohol/week: 0.0 standard drinks    Comment:  "drink very rarely"  . Drug use: Not Currently    Types: Cocaine    Comment: hx of cocaine last in 2006  . Sexual activity: Never  Other Topics Concern  . Not on file  Social History Narrative          Lives in [place] with her husband.    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    Patient has 3 grown children that do not live with her.    Patient does not have any medical insurance.   Social Determinants of Health   Financial Resource Strain:   . Difficulty of Paying Living Expenses:   Food Insecurity:   . Worried About Charity fundraiser in the Last Year:   . Arboriculturist in the Last Year:   Transportation Needs:   . Film/video editor (Medical):   Marland Kitchen Lack of Transportation (Non-Medical):   Physical Activity:   . Days of Exercise per Week:   . Minutes of Exercise per Session:   Stress:   . Feeling of Stress :   Social Connections:   . Frequency  of Communication with Friends and Family:   . Frequency of Social Gatherings with Friends and Family:   . Attends Religious Services:   . Active Member of Clubs or Organizations:   . Attends Archivist Meetings:   Marland Kitchen Marital Status:    Family History  Problem Relation Age of Onset  . COPD Mother        DECEASED/SMOKED  . Diabetes Brother   . Diabetes Brother    Allergies  Allergen Reactions  . Ace Inhibitors Other (See Comments)    Pseudoasthma/ renal failure    Outpatient Encounter Medications as of 05/01/2020  Medication Sig Note  . acetaminophen (TYLENOL) 500 MG tablet Take 1,000 mg by mouth every 6 (six) hours as needed for headache (pain).   Marland Kitchen albuterol (PROVENTIL) (2.5 MG/3ML) 0.083% nebulizer solution Take 3 mLs (2.5 mg total) by nebulization every 6 (six) hours as needed for wheezing or shortness of breath.   Marland Kitchen albuterol (VENTOLIN  HFA) 108 (90 Base) MCG/ACT inhaler Inhale 2 puffs into the lungs every 6 (six) hours as needed for wheezing or shortness of breath.   Marland Kitchen aspirin EC 81 MG tablet Take 1 tablet (81 mg total) by mouth daily.   Marland Kitchen atorvastatin (LIPITOR) 40 MG tablet TAKE 1 TABLET BY MOUTH ONCE DAILY AT  6  PM (Patient taking differently: Take 40 mg by mouth daily. )   . dextromethorphan-guaiFENesin (MUCINEX DM) 30-600 MG 12hr tablet Take 1 tablet by mouth 2 (two) times daily for 7 days.   Marland Kitchen losartan (COZAAR) 50 MG tablet Take 1 tablet (50 mg total) by mouth daily.   . metoprolol tartrate (LOPRESSOR) 25 MG tablet Take 1 tablet (25 mg total) by mouth 2 (two) times daily.   . mometasone-formoterol (DULERA) 200-5 MCG/ACT AERO Inhale 2 puffs into the lungs 2 (two) times daily. 04/23/2020: Ran out yesterday - needs to call pharmacy for refill  . Multiple Vitamin (MULTIVITAMIN WITH MINERALS) TABS tablet Take 1 tablet by mouth daily.   . predniSONE (DELTASONE) 10 MG tablet Take 6 tablets (60 mg total) by mouth daily with breakfast for 2 days, THEN 5 tablets (50 mg total) daily with breakfast for 2 days, THEN 4 tablets (40 mg total) daily with breakfast for 2 days, THEN 3 tablets (30 mg total) daily with breakfast for 2 days, THEN 2 tablets (20 mg total) daily with breakfast for 2 days, THEN 1 tablet (10 mg total) daily with breakfast for 2 days.   Marland Kitchen triamcinolone (NASACORT) 55 MCG/ACT AERO nasal inhaler Place 2 sprays into the nose daily. (Patient taking differently: Place 2 sprays into the nose daily as needed (congestion). )   . ipratropium-albuterol (DUONEB) 0.5-2.5 (3) MG/3ML SOLN Take 3 mLs by nebulization 3 (three) times daily for 7 days. (Patient not taking: Reported on 05/01/2020)   . Misc. Devices (PULSE OXIMETER FOR FINGER) MISC 1 Device by Does not apply route as needed. (Patient not taking: Reported on 05/01/2020)    No facility-administered encounter medications on file as of 05/01/2020.   Patient Care Team    Relationship  Specialty Notifications Start End  Brunetta Jeans, [place] PCP - General Family Medicine  03/06/19    Comment: Catalina Pizza, MD PCP - Cardiology Cardiology Admissions 03/15/19   Comer, Okey Regal, MD  Internal Medicine  11/24/11   Skeet Latch, MD Attending Physician Cardiology  06/04/16   Elmarie Shiley, MD Consulting Physician Nephrology  04/29/17   Brunetta Jeans, PA-C  Physician Assistant  03/06/19    Comment: Merged (Merged)  Madelin Rear, [facility] Pharmacist Pharmacist Admissions 01/31/20    Comment: PHONE NUMBER [phone]  Madelin Rear, [facility] Pharmacist Pharmacist  02/29/20    Comment: PHONE NUMBER [phone]   Current Diagnosis/Assessment:  Hypertension   BP goal < 140/90   BP Readings from Last 3 Encounters:  04/26/20 (!) 144/88  02/08/20 138/88  10/31/19 (!) 138/98   No longer taking amlodipine. Reports intermittent headaches. Denies chest pain or dizziness. Is unsure of any swelling in legs. Checking bp at home infrequently. Today home reading is 142/92. Patient is currently near goal on the following medications:   Metoprolol 25 mg twice daily  Losartan 50 mg daily   We discussed: home BP monitoring plan. Pt to bring readings at upcoming OV.    Plan  Continue current medications.    Tobacco Abuse   Social History   Tobacco Use  Smoking Status Current Some Day Smoker  . Packs/day: 1.00  . Years: 53.00  . Pack years: 53.00  . Types: Cigarettes  . Last attempt to quit: 03/18/2019  . Years since quitting: 1.1  Smokeless Tobacco Never Used   Reports reduction from 1 pack per day to 1 cigarette or less per day now. Patient smokes After 30 minutes of waking. Patient triggers include: drinking coffee. Patient has failed these meds in past: Started back on Chantix starter pack 01/2020 OV - not tolerated at maintenance dose. Patient is currently controlled on the following medications: n/a.  We discussed. Smoking cessation - avoidance of pattern triggers.    Plan  Continue current management.   COPD   Lab Results  Component Value Date/Time   EOSPCT 0 04/26/2020 02:46 AM                           Eos (Absolute):  Lab Results  Component Value Date/Time   EOSABS 0.0 04/26/2020 02:46 AM   Has referral to pulmonology. Last PFTs 03/2019.  Patient has tried the following medications previosuly: Symbicort, Duoneb, Combivent, Advair  Patient is currently taking the following medications:   Dulera 2 puffs twice daily  Albuterol rescue inhaler 2 puffs every 6 hours as needed  Albuterol (PROVENTIL) (2.5 MG/3ML) 0.083% nebulizer solution 3 mLs every 6 hours as needed  Using maintenance inhaler regularly? Yes Frequency of rescue inhaler use:  daily  Plan  Continue current medications.  Recommend referral to pulmonology.  Depression    PHQ 2/9 = 10. Patient reports ongoing symptoms. Declines counseling, states she is open receiving prescription to possibly help with depression. Patient has failed these meds in past: Buproprion hx on file (end date of 09/2018). Patient is currently controlled on the following medications:  . N/a   Plan  Recommend start sertraline 25 mg once daily.   AFIB   Pulse Readings from Last 3 Encounters:  04/26/20 74  02/08/20 85  10/31/19 (!) 107   RFs: female, HTN, age, CHF. CHA2DS2-VASc Score = 4   04/23/2020 EKG. Normal sinus rhythm. Home pulse is 70 today. Not on anticoagulation today. Previous AC with warfarin however discontinued by Dr. Gaetano Hawthorne after series of normal EKGs. Patient is currently rate controlled on the following medications:  Marland Kitchen Metoprolol tartrate 25 mg twice daily   Plan  Continue current medications.   Osteopenia / Osteoporosis   Last DEXA Scan: 2018.   T-Score femoral neck: .-3.0  T-Score lumbar spine: -1.9   VITD  Date Value Ref Range  Status  10/31/2019 21.31 (L) 30.00 - 100.00 ng/mL Final    Patient is a candidate for pharmacologic treatment due to T-Score < -2.5 in  femoral neck. Patient has failed these meds in past: Fosamax. Patient is currently uncontrolled on the following medications: n/a  We discussed: Counseled on fracture risk related to osteoporosis.   Willing to start on injectable if PAP approved.   Plan  Last vitamin D 21.31 on 10/2019. Recommend vitamin D level at 7/17 follow-up visit.  Recommend Prolia or alternative injectable PAP. Vitamin-D/Ca recommendations at follow-up.   Hyperlipidemia   Lipid Panel     Component Value Date/Time   CHOL 307 (H) 10/31/2019 1432   TRIG 342.0 (H) 10/31/2019 1432   HDL 62.60 10/31/2019 1432   CHOLHDL 5 10/31/2019 1432   VLDL 68.4 (H) 10/31/2019 1432   LDLCALC 115 (H) 09/29/2018 0230   LDLDIRECT 151.0 10/31/2019 1432    Lab Results  Component Value Date   TSH 0.749 03/13/2019   TSH 1.59 04/29/2017   The 10-year ASCVD risk score Mikey Bussing DC Jr., et al., 2013) is: 30.6%   Values used to calculate the score:     Age: 12 years     Sex: Female     Is Non-Hispanic African American: No     Diabetic: No     Tobacco smoker: Yes     Systolic Blood Pressure: 270 mmHg     Is BP treated: Yes     HDL Cholesterol: 62.6 mg/dL     Total Cholesterol: 307 mg/dL   Last lab 10/2019 LDL uncontrolled at 151. Atorvastatin restarted 11/2019. Patient is currently taking:  Atorvastatin 20 mg daily  We discussed:  Counseled on atorvastatin use and LDL.   Plan  Continue current medications.    Vaccines   Immunization History  Administered Date(s) Administered  . Fluad Quad(high Dose 65+) 10/31/2019  . Influenza Split 09/27/2011  . Influenza, High Dose Seasonal PF 08/29/2017, 10/04/2018  . Influenza,inj,Quad PF,6+ Mos 07/14/2013, 01/02/2015, 09/10/2016, 08/06/2017  . PFIZER SARS-COV-2 Vaccination 12/03/2019, 12/31/2019  . Pneumococcal Conjugate-13 04/29/2017  . Pneumococcal Polysaccharide-23 09/27/2011, 08/15/2013  . Tdap 10/09/2011   Reviewed vaccination history.  Due for Shingrix.    Plan  Recommended patient receive Shingrix vaccine in pharmacy.   Medication Management   Receives prescription medications from:  [facility] ([place]), [place] - [address] [place] ([place]) [place] [address] Phone: [phone] Fax: [place], [place] [address] Phone: [phone] Fax: [phone]  Is wanting coordinated pharmacy services. Will be following-up with PCP for visit end of week. Requesting med management follow-up after this visit.   Plan  Utilize [facility] for medication synchronization, packaging and delivery.  Follow up: 1 month phone visit. Telephone call by next week for coordinated pharmacy serves.  ______________ Visit Information SDOH (Social Determinants of Health) assessments performed: Yes.  Verbal consent obtained for [facility] enhanced pharmacy services (medication synchronization, adherence packaging, delivery coordination). A medication sync plan was created to allow patient to get all medications delivered once every 30 to 90 days per patient preference. Patient understands they have freedom to choose pharmacy and clinical pharmacist will coordinate care between all prescribers and [facility].  Ms. Boulay was given information about Chronic Care Management services today including:  1. CCM service includes personalized support from designated clinical staff supervised by her physician, including individualized  plan of care and coordination with other care providers 2. 24/7 contact phone numbers for assistance for urgent and routine care needs. 3. Standard insurance, coinsurance, copays and deductibles apply for chronic care management only during months in which we provide at least 20 minutes of these services. Most insurances cover these services at 100%, however patients may be  responsible for any copay, coinsurance and/or deductible if applicable. This service may help you avoid the need for more expensive face-to-face services. 4. Only one practitioner may furnish and bill the service in a calendar month. 5. The patient may stop CCM services at any time (effective at the end of the month) by phone call to the office staff.  Patient agreed to services and verbal consent obtained.   Madelin Rear, Pharm.D., BCGP Clinical Pharmacist [facility] [phone]

## 2020-05-04 ENCOUNTER — Other Ambulatory Visit: Payer: Self-pay

## 2020-05-04 ENCOUNTER — Encounter: Payer: Self-pay | Admitting: Physician Assistant

## 2020-05-04 ENCOUNTER — Ambulatory Visit (INDEPENDENT_AMBULATORY_CARE_PROVIDER_SITE_OTHER): Payer: Medicare Other | Admitting: Physician Assistant

## 2020-05-04 VITALS — BP 110/70 | HR 77 | Temp 97.6°F | Resp 18 | Ht 61.0 in | Wt 141.0 lb

## 2020-05-04 DIAGNOSIS — I10 Essential (primary) hypertension: Secondary | ICD-10-CM

## 2020-05-04 DIAGNOSIS — F329 Major depressive disorder, single episode, unspecified: Secondary | ICD-10-CM

## 2020-05-04 DIAGNOSIS — I5032 Chronic diastolic (congestive) heart failure: Secondary | ICD-10-CM | POA: Diagnosis not present

## 2020-05-04 DIAGNOSIS — Z720 Tobacco use: Secondary | ICD-10-CM | POA: Diagnosis not present

## 2020-05-04 DIAGNOSIS — F32A Depression, unspecified: Secondary | ICD-10-CM

## 2020-05-04 DIAGNOSIS — F419 Anxiety disorder, unspecified: Secondary | ICD-10-CM

## 2020-05-04 DIAGNOSIS — E559 Vitamin D deficiency, unspecified: Secondary | ICD-10-CM

## 2020-05-04 DIAGNOSIS — I714 Abdominal aortic aneurysm, without rupture, unspecified: Secondary | ICD-10-CM | POA: Insufficient documentation

## 2020-05-04 DIAGNOSIS — J441 Chronic obstructive pulmonary disease with (acute) exacerbation: Secondary | ICD-10-CM

## 2020-05-04 DIAGNOSIS — N1832 Chronic kidney disease, stage 3b: Secondary | ICD-10-CM

## 2020-05-04 DIAGNOSIS — I739 Peripheral vascular disease, unspecified: Secondary | ICD-10-CM

## 2020-05-04 DIAGNOSIS — I480 Paroxysmal atrial fibrillation: Secondary | ICD-10-CM | POA: Diagnosis not present

## 2020-05-04 LAB — CBC WITH DIFFERENTIAL/PLATELET
Basophils Absolute: 0 10*3/uL (ref 0.0–0.1)
Basophils Relative: 0.1 % (ref 0.0–3.0)
Eosinophils Absolute: 0 10*3/uL (ref 0.0–0.7)
Eosinophils Relative: 0.1 % (ref 0.0–5.0)
HCT: 41.3 % (ref 36.0–46.0)
Hemoglobin: 13.8 g/dL (ref 12.0–15.0)
Lymphocytes Relative: 8.1 % — ABNORMAL LOW (ref 12.0–46.0)
Lymphs Abs: 1.3 10*3/uL (ref 0.7–4.0)
MCHC: 33.5 g/dL (ref 30.0–36.0)
MCV: 90.4 fl (ref 78.0–100.0)
Monocytes Absolute: 0.3 10*3/uL (ref 0.1–1.0)
Monocytes Relative: 2.2 % — ABNORMAL LOW (ref 3.0–12.0)
Neutro Abs: 14.1 10*3/uL — ABNORMAL HIGH (ref 1.4–7.7)
Neutrophils Relative %: 89.5 % — ABNORMAL HIGH (ref 43.0–77.0)
Platelets: 351 10*3/uL (ref 150.0–400.0)
RBC: 4.57 Mil/uL (ref 3.87–5.11)
RDW: 13.4 % (ref 11.5–15.5)
WBC: 15.7 10*3/uL — ABNORMAL HIGH (ref 4.0–10.5)

## 2020-05-04 LAB — COMPREHENSIVE METABOLIC PANEL
ALT: 16 U/L (ref 0–35)
AST: 15 U/L (ref 0–37)
Albumin: 4 g/dL (ref 3.5–5.2)
Alkaline Phosphatase: 73 U/L (ref 39–117)
BUN: 29 mg/dL — ABNORMAL HIGH (ref 6–23)
CO2: 28 mEq/L (ref 19–32)
Calcium: 9.6 mg/dL (ref 8.4–10.5)
Chloride: 100 mEq/L (ref 96–112)
Creatinine, Ser: 1.81 mg/dL — ABNORMAL HIGH (ref 0.40–1.20)
GFR: 27.46 mL/min — ABNORMAL LOW (ref >60.00)
Glucose, Bld: 104 mg/dL — ABNORMAL HIGH (ref 70–99)
Potassium: 4.7 mEq/L (ref 3.5–5.1)
Sodium: 136 mEq/L (ref 135–145)
Total Bilirubin: 0.4 mg/dL (ref 0.2–1.2)
Total Protein: 6.6 g/dL (ref 6.0–8.3)

## 2020-05-04 LAB — VITAMIN D 25 HYDROXY (VIT D DEFICIENCY, FRACTURES): VITD: 27.03 ng/mL — ABNORMAL LOW (ref 30.00–100.00)

## 2020-05-04 MED ORDER — TRELEGY ELLIPTA 100-62.5-25 MCG/INH IN AEPB
INHALATION_SPRAY | RESPIRATORY_TRACT | 3 refills | Status: DC
Start: 2020-05-04 — End: 2020-12-21

## 2020-05-04 MED ORDER — SERTRALINE HCL 25 MG PO TABS: 25.0000 mg | ORAL_TABLET | Freq: Every day | ORAL | 1 refills | Status: DC

## 2020-05-04 NOTE — Progress Notes (Signed)
Patient presents to clinic today for TCM visit/Hospitalization follow-up. Patient presented to the ER on 04/23/2020 with c/o 4-5 days of worsening cough and SOB.  ER workup included EKG (negative for ischemic changes), CXR (negative for focal pneumonia), BNP elevated at 432 and negative troponin and COVID test. Patient was admitted for acute COPD exacerbation.    During admission CT chest performed showing scattered nonspecific infectious or inflammatory opacity of bilateral lungs with mild bronchial plugging throughout and emphysema. Echo revealed EF of 60-65%. Patient stabilized on duoneb, IV solumedrol transitioned to oral prednisone. Discharged on azithromycin to complete a 5-day course.   Since discharge, patient endorses doing better overall. Has been trying to keep hydrated. Has cur back on cigarette use. Is using her inhalers daily as directed. Has completed steroid taper and azithromycin. Patient notes still having fatigue and some chest tightness. Is wondering if her maintenance medication need to be adjusted. Has not scheduled with Pulmonology. Has not scheduled follow-up with Cardiology as directed by hospitalist. Also notes no scheduled Nephrology follow-up on file.   Patient has been referred to Pulmonology several times in the past, most recently April of this year. Was scheduled with Pulmonology on 02/28/2020 but did not show up to her appointment and did not call back to schedule.   Past Medical History:  Diagnosis Date  . AAA (abdominal aortic aneurysm) ([place])   . Arthritis   . Cholelithiasis 2011   s/p cholescystectomy   . Chronic diastolic congestive heart failure ([place])   . COPD (chronic obstructive pulmonary disease) ([place])   . Emphysema   . GERD (gastroesophageal reflux disease)   . History of heartburn   . History of renal cell carcinoma   . Hypercholesteremia   . Hypertension   . Hypertension   . Mitral regurgitation   . Pneumonia 1980's  . Renal cell carcinoma 01/2010    s/p right radical nephrectomy 01/2010,  followed by [facility]  . S/P minimally invasive mitral valve repair 04/20/2019   Complex valvuloplasty including triangular resection of flail segment of posterior leaflet, artificial Gore-tex neochord placement x6 and 30 mm Sorin Memo 4D ring annuloplasty via right mini thoracotomy approach  . Shortness of breath   . Tuberculosis    Tests positive for PPD. dad had h/o TB.    Current Outpatient Medications on File Prior to Visit  Medication Sig Dispense Refill  . acetaminophen (TYLENOL) 500 MG tablet Take 1,000 mg by mouth every 6 (six) hours as needed for headache (pain).    Marland Kitchen albuterol (PROVENTIL) (2.5 MG/3ML) 0.083% nebulizer solution Take 3 mLs (2.5 mg total) by nebulization every 6 (six) hours as needed for wheezing or shortness of breath. 150 mL 3  . albuterol (VENTOLIN HFA) 108 (90 Base) MCG/ACT inhaler Inhale 2 puffs into the lungs every 6 (six) hours as needed for wheezing or shortness of breath. 8 g 5  . aspirin EC 81 MG tablet Take 1 tablet (81 mg total) by mouth daily. 30 tablet 0  . atorvastatin (LIPITOR) 40 MG tablet TAKE 1 TABLET BY MOUTH ONCE DAILY AT  6  PM (Patient taking differently: Take 40 mg by mouth daily. ) 30 tablet 0  . losartan (COZAAR) 50 MG tablet Take 1 tablet (50 mg total) by mouth daily. 90 tablet 3  . metoprolol tartrate (LOPRESSOR) 25 MG tablet Take 1 tablet (25 mg total) by mouth 2 (two) times daily. 60 tablet 3  . Misc. Devices (PULSE OXIMETER FOR FINGER) MISC 1 Device by  Does not apply route as needed. 1 each 0  . mometasone-formoterol (DULERA) 200-5 MCG/ACT AERO Inhale 2 puffs into the lungs 2 (two) times daily. 16 g 3  . Multiple Vitamin (MULTIVITAMIN WITH MINERALS) TABS tablet Take 1 tablet by mouth daily.    . predniSONE (DELTASONE) 10 MG tablet Take 6 tablets (60 mg total) by mouth daily with breakfast for 2 days, THEN 5 tablets (50 mg total) daily with breakfast for 2 days, THEN 4 tablets (40 mg total)  daily with breakfast for 2 days, THEN 3 tablets (30 mg total) daily with breakfast for 2 days, THEN 2 tablets (20 mg total) daily with breakfast for 2 days, THEN 1 tablet (10 mg total) daily with breakfast for 2 days. 42 tablet 0  . triamcinolone (NASACORT) 55 MCG/ACT AERO nasal inhaler Place 2 sprays into the nose daily. (Patient taking differently: Place 2 sprays into the nose daily as needed (congestion). ) 1 Inhaler 0   No current facility-administered medications on file prior to visit.    Allergies  Allergen Reactions  . Ace Inhibitors Other (See Comments)    Pseudoasthma/ renal failure     Family History  Problem Relation Age of Onset  . COPD Mother        DECEASED/SMOKED  . Diabetes Brother   . Diabetes Brother     Social History   Socioeconomic History  . Marital status: Married    Spouse name: Not on file  . Number of children: 3  . Years of education: 84  . Highest education level: Not on file  Occupational History  . 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 Smoking status: Current Some Day Smoker    Packs/day: 1.00    Years: 53.00    Pack years: 53.00    Types: Cigarettes    Last attempt to quit: 03/18/2019    Years since quitting: 1.1  . Smokeless tobacco: Never Used  Vaping Use  . Vaping Use: Never used  Substance and Sexual Activity  . Alcohol use: Yes    Alcohol/week: 0.0 standard drinks    Comment:  "drink very rarely"  . Drug use: Not Currently    Types: Cocaine    Comment: hx of cocaine last in 2006  . Sexual activity: Never  Other Topics Concern  . Not on file  Social History Narrative          Lives in [place] with her husband.    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    Patient has 3 grown children that do not live with her.    Patient does not have any medical insurance.   Social Determinants of Health   Financial  Resource Strain:   . Difficulty of Paying Living Expenses:   Food Insecurity:   . Worried About Charity fundraiser in the Last Year:   . Arboriculturist in the Last Year:   Transportation Needs:   . Film/video editor (Medical):   Marland Kitchen Lack of Transportation (Non-Medical):   Physical Activity:   . Days of Exercise per Week:   . Minutes of Exercise per Session:   Stress:   . Feeling of Stress :   Social Connections:   . Frequency of Communication with Friends and Family:   . Frequency of Social Gatherings with Friends and Family:   . Attends Religious Services:   .  Active Member of Clubs or Organizations:   . Attends Archivist Meetings:   Marland Kitchen Marital Status:    Review of Systems - See HPI.  All other ROS are negative.  Wt 141 lb (64 kg)   BMI 26.64 kg/m   Physical Exam Vitals reviewed.  Constitutional:      Appearance: Normal appearance.  HENT:     Head: Normocephalic and atraumatic.     Right Ear: Tympanic membrane normal.     Left Ear: Tympanic membrane normal.  Eyes:     Conjunctiva/sclera: Conjunctivae normal.     Pupils: Pupils are equal, round, and reactive to light.  Cardiovascular:     Rate and Rhythm: Normal rate and regular rhythm.     Pulses: Normal pulses.     Heart sounds: Normal heart sounds.  Pulmonary:     Breath sounds: Wheezing present.  Musculoskeletal:     Cervical back: Neck supple.  Neurological:     General: No focal deficit present.     Mental Status: She is alert and oriented to person, place, and time.  Psychiatric:        Mood and Affect: Mood normal.     Recent Results (from the past 2160 hour(s))  Basic metabolic panel     Status: Abnormal   Collection Time: 04/23/20  2:23 PM  Result Value Ref Range   Sodium 140 135 - 145 mmol/L   Potassium 4.0 3.5 - 5.1 mmol/L   Chloride 102 98 - 111 mmol/L   CO2 28 22 - 32 mmol/L   Glucose, Bld 122 (H) 70 - 99 mg/dL    Comment: Glucose reference range applies only to samples taken  after fasting for at least 8 hours.   BUN 14 8 - 23 mg/dL   Creatinine, Ser 1.55 (H) 0.44 - 1.00 mg/dL   Calcium 9.3 8.9 - 10.3 mg/dL   GFR calc non Af Amer 33 (L) >60 mL/min   GFR calc Af Amer 38 (L) >60 mL/min   Anion gap 10 5 - 15    Comment: Performed at [facility] [address], [place], [place] [address]  CBC     Status: None   Collection Time: 04/23/20  2:23 PM  Result Value Ref Range   WBC 5.4 4.0 - 10.5 K/uL   RBC 4.13 3.87 - 5.11 MIL/uL   Hemoglobin 12.1 12.0 - 15.0 g/dL   HCT 38.5 36 - 46 %   MCV 93.2 80.0 - 100.0 fL   MCH 29.3 26.0 - 34.0 pg   MCHC 31.4 30.0 - 36.0 g/dL   RDW 12.3 11.5 - 15.5 %   Platelets 250 150 - 400 K/uL   nRBC 0.0 0.0 - 0.2 %    Comment: Performed at [facility], [place] [address], [place], [place] [address]  Troponin I (High Sensitivity)     Status: None   Collection Time: 04/23/20  5:40 PM  Result Value Ref Range   Troponin I (High Sensitivity) 13 <18 ng/L    Comment: (NOTE) Elevated high sensitivity troponin I (hsTnI) values and significant  changes across serial measurements may suggest ACS but many other  chronic and acute conditions are known to elevate hsTnI results.  Refer to the "Links" section for chest pain algorithms and additional  guidance. Performed at [facility], [place] [address] [place],  [address]   Brain natriuretic peptide     Status: Abnormal   Collection Time: 04/23/20  5:40 PM  Result Value Ref Range   B Natriuretic Peptide 432.2 (H) 0.0 - 100.0 pg/mL    Comment: Performed at [facility] [address], [place], [place] [address]  I-Stat venous blood gas, [facility] ED)     Status: Abnormal   Collection Time: 04/23/20  5:53 PM  Result Value Ref Range   pH, Ven 7.388 7.25 - 7.43   pCO2, Ven 51.8 44 - 60 mmHg   pO2, Ven 39.0 32 - 45 mmHg   Bicarbonate 31.2 (H) 20.0 - 28.0 mmol/L   TCO2 33 (H) 22 - 32 mmol/L   O2 Saturation 72.0 %   Acid-Base Excess 5.0 (H) 0.0 - 2.0 mmol/L   Sodium  142 135 - 145 mmol/L   Potassium 4.1 3.5 - 5.1 mmol/L   Calcium, Ion 1.23 1.15 - 1.40 mmol/L   HCT 38.0 36 - 46 %   Hemoglobin 12.9 12.0 - 15.0 g/dL   Sample type VENOUS   Troponin I (High Sensitivity)     Status: None   Collection Time: 04/23/20  7:53 PM  Result Value Ref Range   Troponin I (High Sensitivity) 11 <18 ng/L    Comment: (NOTE) Elevated high sensitivity troponin I (hsTnI) values and significant  changes across serial measurements may suggest ACS but many other  chronic and acute conditions are known to elevate hsTnI results.  Refer to the "Links" section for chest pain algorithms and additional  guidance. Performed at [facility], [place] [address] [place], [place] [address]   SARS Coronavirus 2 by RT PCR (hospital order, performed in [facility] hospital lab) Nasopharyngeal Nasopharyngeal Swab     Status: None   Collection Time: 04/23/20  7:57 PM   Specimen: Nasopharyngeal Swab  Result Value Ref Range   SARS Coronavirus 2 NEGATIVE NEGATIVE    Comment: (NOTE) SARS-CoV-2 target nucleic acids are NOT DETECTED.  The SARS-CoV-2 RNA is generally detectable in upper and lower respiratory specimens during the acute phase of infection. The lowest concentration of SARS-CoV-2 viral copies this assay can detect is 250 copies / mL. A negative result does not preclude SARS-CoV-2 infection and should not be used as the sole basis for treatment or other patient management decisions.  A negative result may occur with improper specimen collection / handling, submission of specimen other than nasopharyngeal swab, presence of viral mutation(s) within the areas targeted by this assay, and inadequate number of viral copies (<250 copies / mL). A negative result must be combined with clinical observations, patient history, and epidemiological information.  Fact Sheet for Patients:   StrictlyIdeas.no  Fact Sheet for Healthcare  Providers: BankingDealers.co.za  This test is not yet approved or  cleared by the [place] FDA and has been authorized for detection and/or diagnosis of SARS-CoV-2 by FDA under an Emergency Use Authorization (EUA).  This EUA will remain in effect (meaning this test can be used) for the duration of the COVID-19 declaration under Section 564(b)(1) of the Act, 21 U.S.C. section 360bbb-3(b)(1), unless the authorization is terminated or revoked sooner.  Performed at [facility], [place] [address] [place], [place] [address]   Basic metabolic panel     Status: Abnormal   Collection Time: 04/24/20  4:32 AM  Result Value Ref Range   Sodium 141 135 - 145 mmol/L   Potassium 4.4 3.5 - 5.1 mmol/L   Chloride 105 98 - 111 mmol/L   CO2 25 22 - 32 mmol/L   Glucose,  Bld 163 (H) 70 - 99 mg/dL    Comment: Glucose reference range applies only to samples taken after fasting for at least 8 hours.   BUN 16 8 - 23 mg/dL   Creatinine, Ser 1.43 (H) 0.44 - 1.00 mg/dL   Calcium 9.6 8.9 - 10.3 mg/dL   GFR calc non Af Amer 36 (L) >60 mL/min   GFR calc Af Amer 42 (L) >60 mL/min   Anion gap 11 5 - 15    Comment: Performed at [facility] [address] [place], [place] [address]  CBC     Status: Abnormal   Collection Time: 04/24/20  4:32 AM  Result Value Ref Range   WBC 3.7 (L) 4.0 - 10.5 K/uL   RBC 3.98 3.87 - 5.11 MIL/uL   Hemoglobin 11.7 (L) 12.0 - 15.0 g/dL   HCT 36.8 36 - 46 %   MCV 92.5 80.0 - 100.0 fL   MCH 29.4 26.0 - 34.0 pg   MCHC 31.8 30.0 - 36.0 g/dL   RDW 12.1 11.5 - 15.5 %   Platelets 235 150 - 400 K/uL   nRBC 0.0 0.0 - 0.2 %    Comment: Performed at [facility], [place] [address] [place], [place] [address]  ECHOCARDIOGRAM COMPLETE     Status: None   Collection Time: 04/24/20 11:22 AM  Result Value Ref Range   Weight 2,313.95 oz   Height 61 in   BP 129/90 mmHg  Respiratory Panel by PCR     Status: Abnormal   Collection Time: 04/25/20 10:08  AM   Specimen: Nasopharyngeal Swab; Respiratory  Result Value Ref Range   Adenovirus NOT DETECTED NOT DETECTED   Coronavirus 229E NOT DETECTED NOT DETECTED    Comment: (NOTE) The Coronavirus on the Respiratory Panel, DOES NOT test for the novel  Coronavirus (2019 nCoV)    Coronavirus HKU1 NOT DETECTED NOT DETECTED   Coronavirus NL63 NOT DETECTED NOT DETECTED   Coronavirus OC43 DETECTED (A) NOT DETECTED   Metapneumovirus NOT DETECTED NOT DETECTED   Rhinovirus / Enterovirus NOT DETECTED NOT DETECTED   Influenza A NOT DETECTED NOT DETECTED   Influenza B NOT DETECTED NOT DETECTED   Parainfluenza Virus 1 NOT DETECTED NOT DETECTED   Parainfluenza Virus 2 NOT DETECTED NOT DETECTED   Parainfluenza Virus 3 NOT DETECTED NOT DETECTED   Parainfluenza Virus 4 NOT DETECTED NOT DETECTED   Respiratory Syncytial Virus NOT DETECTED NOT DETECTED   Bordetella pertussis NOT DETECTED NOT DETECTED   Chlamydophila pneumoniae NOT DETECTED NOT DETECTED   Mycoplasma pneumoniae NOT DETECTED NOT DETECTED    Comment: Performed at [facility], [place] [address], [place], [place] [address]  CBC with Differential/Platelet     Status: Abnormal   Collection Time: 04/26/20  2:46 AM  Result Value Ref Range   WBC 9.4 4.0 - 10.5 K/uL   RBC 3.69 (L) 3.87 - 5.11 MIL/uL   Hemoglobin 11.2 (L) 12.0 - 15.0 g/dL   HCT 34.3 (L) 36 - 46 %   MCV 93.0 80.0 - 100.0 fL   MCH 30.4 26.0 - 34.0 pg   MCHC 32.7 30.0 - 36.0 g/dL   RDW 12.1 11.5 - 15.5 %   Platelets 271 150 - 400 K/uL   nRBC 0.0 0.0 - 0.2 %   Neutrophils Relative % 87 %   Neutro Abs 8.3 (H) 1.7 - 7.7 K/uL   Lymphocytes Relative 8 %   Lymphs Abs 0.7 0.7 - 4.0  K/uL   Monocytes Relative 4 %   Monocytes Absolute 0.3 0 - 1 K/uL   Eosinophils Relative 0 %   Eosinophils Absolute 0.0 0 - 0 K/uL   Basophils Relative 0 %   Basophils Absolute 0.0 0 - 0 K/uL   Immature Granulocytes 1 %   Abs Immature Granulocytes 0.08 (H) 0.00 - 0.07 K/uL    Comment: Performed at  [facility] [address], [place], [place] [address]  Basic metabolic panel     Status: Abnormal   Collection Time: 04/26/20  2:46 AM  Result Value Ref Range   Sodium 139 135 - 145 mmol/L   Potassium 4.2 3.5 - 5.1 mmol/L   Chloride 106 98 - 111 mmol/L   CO2 24 22 - 32 mmol/L   Glucose, Bld 143 (H) 70 - 99 mg/dL    Comment: Glucose reference range applies only to samples taken after fasting for at least 8 hours.   BUN 29 (H) 8 - 23 mg/dL   Creatinine, Ser 1.59 (H) 0.44 - 1.00 mg/dL   Calcium 9.7 8.9 - 10.3 mg/dL   GFR calc non Af Amer 32 (L) >60 mL/min   GFR calc Af Amer 37 (L) >60 mL/min   Anion gap 9 5 - 15    Comment: Performed at [facility] [address] [place], [place] [address]    Assessment/Plan: 1. COPD exacerbation ([place]) Resolving. Patient given number to schedule Pulm appointment as her referral is still active. She is to continue working on her smoking cessation. Declines further intervention presently. Repeat CBC today to ensure stability. Will switch her maintenance inhaler to Trelegy. Continue Albuterol as needed. Continue plain Mucinex. Strict ER precautions reviewed with patient.  - CBC with Differential/Platelet  2. Chronic diastolic congestive heart failure ([place]) Euvolemic on examination today. BP above goal, will increae Losartan to 50 mg AM and 25 mg PM. Continue other medications as directed. She is to call Cardiology to schedule hospital follow-up.   3. Paroxysmal atrial fibrillation (HCC) NSR today. Continue current regimen. Follow-up to be scheduled with Cardiology.   4. Essential hypertension BP above goal. Renal function in hospital was stable. Increase losartan to 50 mg AM and 25 mg PM. Close follow-up scheduled. Repeat labs today. - Comprehensive metabolic panel - CBC with Differential/Platelet  5. Tobacco abuse disorder Continue smoking cessation efforts.   6. AAA (abdominal aortic aneurysm) without rupture ([place]) Due for repeat  screen. Order placed.  - US AORTA; Future  7. Stage 3b chronic kidney disease Repeat labs today to ensure stability. Follow-up with Nephrology as scheduled.  - Comprehensive metabolic panel - CBC with Differential/Platelet  8. Anxiety and depression Will start 25 mg Sertraline daily. Follow-up scheduled.  - sertraline (ZOLOFT) 25 MG tablet; Take 1 tablet (25 mg total) by mouth daily.  Dispense: 30 tablet; Refill: 1  9. Vitamin D deficiency Repeat levels today. - Vitamin D (25 hydroxy)  10. Intermittent claudication (HCC) Mentioned at end of visit -- pain in upper legs bilaterally with walking that quickly resolves with rest. Will check arterial US of lower extremities to further assess.  - [place] Lower Ext Art Bilat; Future  This visit occurred during the SARS-CoV-2 public health emergency.  Safety protocols were in place, including screening questions prior to the visit, additional usage of staff PPE, and extensive cleaning of exam room while observing appropriate contact time as indicated for disinfecting solutions.     [name], PA-C

## 2020-05-04 NOTE — Patient Instructions (Signed)
Please go to the lab today for blood work.  I will call you with your results. We will alter treatment regimen(s) if indicated by your results.   Please stop the [facility] and start the Trelegy once daily. Ok to continue use of your Albuterol (rescue inhaler).  Please call [facility] at [phone] to schedule your appointment.  Also make sure that you contact your Pulmonologist and Cardiologist to schedule follow-ups.   You will be contacted to schedule your US of the abdomen to reassess your aortic aneurysm.   You will also be contacted for US of the legs due to pain with walking.   Start the Sertraline once daily. Use as directed.  Follow-up with me in 4 weeks for reassessment.   For the Losartan, continue the 50 mg in the morning. Can add on 1/2 tablet (25 mg) in the evening since BP in the evenings have been quite high. Follow-up as scheduled.

## 2020-05-08 ENCOUNTER — Telehealth: Payer: Self-pay

## 2020-05-08 ENCOUNTER — Ambulatory Visit: Payer: Self-pay

## 2020-05-08 NOTE — Progress Notes (Signed)
Unable to reach patient, left multiple VMs since 7/20.  Was needing to confirm fill hx and remaining QTYs due to pt wanting to use med synch and [facility] delivery [facility].  Atorvastatin 40 mg 01/27/2020 30 ds  Trelegy 100-62.5-25 AE 1 puff daily  05/04/2020 14 ds   Metoprolol 25 mg tablet twice daily  04/26/2020 30 ds  Sertraline 25 mg once daily  05/04/2020 30 ds   Losartan 50 mg  04/26/2020 30 ds   Pharmacy coordination incomplete.   Needing f/u with:  [facility] appt [phone]  Cardiologist appt  [place] [facility] - will call pt; attempted 05/04/2020   Patient Care Team    Relationship Specialty Notifications Start End  Brunetta Jeans, PA-C PCP - General Family Medicine  03/06/19    Comment: Catalina Pizza, MD PCP - Cardiology Cardiology Admissions 03/15/19   Comer, Okey Regal, MD  Internal Medicine  11/24/11   Skeet Latch, MD Attending Physician Cardiology  06/04/16   Elmarie Shiley, MD Consulting Physician Nephrology  04/29/17   Brunetta Jeans, PA-C  Physician Assistant  03/06/19    Comment: Merged (Iona Hansen)  Madelin Rear, [facility] Pharmacist Pharmacist Admissions 01/31/20    Comment: PHONE NUMBER [phone]  Madelin Rear, [facility] Pharmacist Pharmacist  02/29/20    Comment: PHONE NUMBER [phone]   Goals    . PharmD Care Plan     CARE PLAN ENTRY (see longitudinal plan of care for additional care plan information)  Current Barriers:  . Chronic Disease Management support, education, and care coordination needs related to Hypertension, Hyperlipidemia, COPD, Depression, and Osteoporosis   COPD . Pharmacist Clinical Goal(s) o Over the next 180 days, patient will work with PharmD and providers to ensure safe and effective medication management of COPD . Current regimen:   Dulera 2 puffs twice daily  Albuterol rescue inhaler 2 puffs every 6 hours as needed  Albuterol (PROVENTIL) (2.5 MG/3ML) 0.083% nebulizer solution 3 mLs every 6 hours as  needed . Interventions: o Referral to pulmonology recommended o With help with any needed patient assistance . Patient self care activities - Over the next 180 days, patient will: o Follow-up on pulmonology referral  o Call with any questions   Hypertension  BP Readings from Last 3 Encounters:  04/26/20 (!) 144/88  02/08/20 138/88  10/31/19 (!) 138/98   . Pharmacist Clinical Goal(s): o Over the next 180 days, patient will work with PharmD and providers to maintain BP goal <140/90 . Current regimen:   Metoprolol 25 mg twice daily  Losartan 50 mg daily  . Interventions: o Home monitoring plan . Patient self care activities - Over the next 7 days, patient will: o Check BP 3 times daily, document, and provide at future appointments o Ensure daily salt intake < 2300 mg/day  Hyperlipidemia Lab Results  Component Value Date/Time   LDLCALC 115 (H) 09/29/2018 02:30 AM   LDLDIRECT 151.0 10/31/2019 02:32 PM   . Pharmacist Clinical Goal(s): o Over the next 180 days, patient will work with PharmD and providers to achieve LDL goal < 100 . Current regimen:  o Atorvastatin 40 mg daily  . Interventions: o Dietary handout provided . Patient self care activities - Over the next 180 days, patient will: o Continue current management o Please let me know if you are having any issues taking atorvastatin every day  Osteoporosis . Pharmacist Clinical Goal(s) o Over the next 180 days, patient will work with PharmD and  providers to ensure safe and effective therapy to loss progression of osteoporosis and reduce fracture risk . Current regimen:  o N/a . Interventions: o Ensure optimal intake of calcium/vitamin d -  1200 mg of calcium (total of diet and supplement) and 800 international units of vitamin D daily for most postmenopausal women o Patient assistance applications as appropriate  . Patient self care activities - Over the next 180 days, patient will: o Call me with any questions    Depression . Pharmacist Clinical Goal(s) o Over the next 90 days, patient will work with PharmD and providers to minimize symptoms of depression . Current regimen:  o No current therapy . Interventions: o Recommend starting on antidepressant - will be discussed at follow-up visit with PCP . Patient self care activities - Over the next 180 days, patient will: o Call with any questions  Medication management . Pharmacist Clinical Goal(s): o Over the next 180 days, patient will work with PharmD and providers to achieve optimal medication adherence . Current pharmacy: [facility] . Interventions o Comprehensive medication review performed. o Utilize [facility] for medication synchronization, packaging and delivery . Patient self care activities - Over the next 180 days, patient will: o Take medications as prescribed o Report any questions or concerns to PharmD and/or provider(s)  Initial goal documentation.    . Quit smoking / using tobacco    . Quit smoking / using tobacco     Stop smoking by taking Chantix.         Current Outpatient Medications:  .  acetaminophen (TYLENOL) 500 MG tablet, Take 1,000 mg by mouth every 6 (six) hours as needed for headache (pain)., Disp: , Rfl:  .  albuterol (PROVENTIL) (2.5 MG/3ML) 0.083% nebulizer solution, Take 3 mLs (2.5 mg total) by nebulization every 6 (six) hours as needed for wheezing or shortness of breath., Disp: 150 mL, Rfl: 3 .  albuterol (VENTOLIN HFA) 108 (90 Base) MCG/ACT inhaler, Inhale 2 puffs into the lungs every 6 (six) hours as needed for wheezing or shortness of breath., Disp: 8 g, Rfl: 5 .  aspirin EC 81 MG tablet, Take 1 tablet (81 mg total) by mouth daily., Disp: 30 tablet, Rfl: 0 .  atorvastatin (LIPITOR) 40 MG tablet, TAKE 1 TABLET BY MOUTH ONCE DAILY AT  6  PM (Patient taking differently: Take 40 mg by mouth daily. ), Disp: 30 tablet, Rfl: 0 .  Ergocalciferol (VITAMIN D2) 50 MCG (2000 UT) TABS, Take 1 capsule by mouth  daily., Disp: 90 tablet, Rfl: 11 .  Fluticasone-Umeclidin-Vilant (TRELEGY ELLIPTA) 100-62.5-25 MCG/INH AEPB, 1 inhalation daily., Disp: 28 each, Rfl: 3 .  losartan (COZAAR) 50 MG tablet, Take 1 tablet (50 mg total) by mouth daily., Disp: 90 tablet, Rfl: 3 .  metoprolol tartrate (LOPRESSOR) 25 MG tablet, Take 1 tablet (25 mg total) by mouth 2 (two) times daily., Disp: 60 tablet, Rfl: 3 .  Misc. Devices (PULSE OXIMETER FOR FINGER) MISC, 1 Device by Does not apply route as needed., Disp: 1 each, Rfl: 0 .  Multiple Vitamin (MULTIVITAMIN WITH MINERALS) TABS tablet, Take 1 tablet by mouth daily., Disp: , Rfl:  .  sertraline (ZOLOFT) 25 MG tablet, Take 1 tablet (25 mg total) by mouth daily., Disp: 30 tablet, Rfl: 1 .  triamcinolone (NASACORT) 55 MCG/ACT AERO nasal inhaler, Place 2 sprays into the nose daily. (Patient taking differently: Place 2 sprays into the nose daily as needed (congestion). ), Disp: 1 Inhaler, Rfl: 0  Madelin Rear, Pharm.D., BCGP Clinical  Horticulturist, [employer] [place] [phone]

## 2020-05-08 NOTE — Telephone Encounter (Signed)
Patient wants to set up medication synchronization this week per 05/01/2020 ccm visit.  Attempted to call patient regarding [facility] coordination.  Unable to leave VM. Need to confirm date of first delivery and days supply preference.   Atorvastatin 40 mg 01/27/2020 30 ds  Trelegy 100-62.5-25 AE 1 puff daily  05/04/2020 14 ds   Metoprolol 25 mg tablet twice daily  04/26/2020 30 ds  Sertraline 25 mg once daily  05/04/2020 30 ds   Losartan 50 mg  04/26/2020 30 ds

## 2020-05-16 ENCOUNTER — Encounter: Payer: Self-pay | Admitting: Emergency Medicine

## 2020-05-16 ENCOUNTER — Other Ambulatory Visit: Payer: Self-pay | Admitting: Emergency Medicine

## 2020-05-16 DIAGNOSIS — N1832 Chronic kidney disease, stage 3b: Secondary | ICD-10-CM

## 2020-05-16 DIAGNOSIS — I5032 Chronic diastolic (congestive) heart failure: Secondary | ICD-10-CM

## 2020-05-16 DIAGNOSIS — E559 Vitamin D deficiency, unspecified: Secondary | ICD-10-CM

## 2020-05-16 MED ORDER — VITAMIN D2 50 MCG (2000 UT) PO TABS: 1.0000 | ORAL_TABLET | Freq: Every day | ORAL | 11 refills | Status: DC

## 2020-05-17 ENCOUNTER — Ambulatory Visit
Admission: RE | Admit: 2020-05-17 | Discharge: 2020-05-17 | Disposition: A | Discharge status: Home / Self Care | Payer: Medicare Other | Source: Ambulatory Visit | Attending: Physician Assistant | Admitting: Physician Assistant

## 2020-05-17 DIAGNOSIS — I714 Abdominal aortic aneurysm, without rupture, unspecified: Secondary | ICD-10-CM

## 2020-05-17 DIAGNOSIS — M79605 Pain in left leg: Secondary | ICD-10-CM | POA: Diagnosis not present

## 2020-05-17 DIAGNOSIS — I739 Peripheral vascular disease, unspecified: Secondary | ICD-10-CM

## 2020-05-17 DIAGNOSIS — M79604 Pain in right leg: Secondary | ICD-10-CM | POA: Diagnosis not present

## 2020-05-17 DIAGNOSIS — M79661 Pain in right lower leg: Secondary | ICD-10-CM | POA: Diagnosis not present

## 2020-05-17 DIAGNOSIS — M79662 Pain in left lower leg: Secondary | ICD-10-CM | POA: Diagnosis not present

## 2020-05-29 ENCOUNTER — Encounter: Payer: Self-pay | Admitting: Emergency Medicine

## 2020-05-30 ENCOUNTER — Other Ambulatory Visit: Payer: Self-pay | Admitting: Emergency Medicine

## 2020-05-30 DIAGNOSIS — E782 Mixed hyperlipidemia: Secondary | ICD-10-CM

## 2020-05-30 DIAGNOSIS — E559 Vitamin D deficiency, unspecified: Secondary | ICD-10-CM

## 2020-05-30 MED ORDER — VITAMIN D (ERGOCALCIFEROL) 1.25 MG (50000 UNIT) PO CAPS: ORAL_CAPSULE | ORAL | 0 refills | Status: DC

## 2020-05-30 MED ORDER — ATORVASTATIN CALCIUM 40 MG PO TABS: 40.0000 mg | ORAL_TABLET | Freq: Every day | ORAL | 1 refills | Status: DC

## 2020-06-01 ENCOUNTER — Other Ambulatory Visit: Payer: Self-pay

## 2020-06-01 ENCOUNTER — Ambulatory Visit (INDEPENDENT_AMBULATORY_CARE_PROVIDER_SITE_OTHER): Payer: Medicare Other

## 2020-06-01 DIAGNOSIS — N1832 Chronic kidney disease, stage 3b: Secondary | ICD-10-CM | POA: Diagnosis not present

## 2020-06-01 DIAGNOSIS — I5032 Chronic diastolic (congestive) heart failure: Secondary | ICD-10-CM | POA: Diagnosis not present

## 2020-06-01 LAB — COMPREHENSIVE METABOLIC PANEL
ALT: 13 U/L (ref 0–35)
AST: 18 U/L (ref 0–37)
Albumin: 4 g/dL (ref 3.5–5.2)
Alkaline Phosphatase: 74 U/L (ref 39–117)
BUN: 26 mg/dL — ABNORMAL HIGH (ref 6–23)
CO2: 26 mEq/L (ref 19–32)
Calcium: 9.6 mg/dL (ref 8.4–10.5)
Chloride: 105 mEq/L (ref 96–112)
Creatinine, Ser: 1.5 mg/dL — ABNORMAL HIGH (ref 0.40–1.20)
GFR: 34.1 mL/min — ABNORMAL LOW (ref >60.00)
Glucose, Bld: 61 mg/dL — ABNORMAL LOW (ref 70–99)
Potassium: 4.1 mEq/L (ref 3.5–5.1)
Sodium: 141 mEq/L (ref 135–145)
Total Bilirubin: 0.3 mg/dL (ref 0.2–1.2)
Total Protein: 6.4 g/dL (ref 6.0–8.3)

## 2020-06-01 LAB — CBC WITH DIFFERENTIAL/PLATELET
Basophils Absolute: 0.1 10*3/uL (ref 0.0–0.1)
Basophils Relative: 0.8 % (ref 0.0–3.0)
Eosinophils Absolute: 0.1 10*3/uL (ref 0.0–0.7)
Eosinophils Relative: 1.4 % (ref 0.0–5.0)
HCT: 37.5 % (ref 36.0–46.0)
Hemoglobin: 12.5 g/dL (ref 12.0–15.0)
Lymphocytes Relative: 21.6 % (ref 12.0–46.0)
Lymphs Abs: 1.7 10*3/uL (ref 0.7–4.0)
MCHC: 33.2 g/dL (ref 30.0–36.0)
MCV: 90.5 fl (ref 78.0–100.0)
Monocytes Absolute: 0.4 10*3/uL (ref 0.1–1.0)
Monocytes Relative: 5.5 % (ref 3.0–12.0)
Neutro Abs: 5.6 10*3/uL (ref 1.4–7.7)
Neutrophils Relative %: 70.7 % (ref 43.0–77.0)
Platelets: 301 10*3/uL (ref 150.0–400.0)
RBC: 4.15 Mil/uL (ref 3.87–5.11)
RDW: 14 % (ref 11.5–15.5)
WBC: 7.8 10*3/uL (ref 4.0–10.5)

## 2020-06-05 ENCOUNTER — Other Ambulatory Visit: Payer: Self-pay | Admitting: Emergency Medicine

## 2020-06-05 DIAGNOSIS — N1832 Chronic kidney disease, stage 3b: Secondary | ICD-10-CM

## 2020-07-12 ENCOUNTER — Telehealth: Payer: Self-pay | Admitting: Physician Assistant

## 2020-07-12 NOTE — Telephone Encounter (Signed)
Left message for patient to schedule Annual Wellness Visit.  Please schedule with Nurse Health Advisor Martha Stanley, RN at [facility]  

## 2020-08-07 ENCOUNTER — Ambulatory Visit: Payer: Medicare Other | Attending: Internal Medicine

## 2020-08-07 DIAGNOSIS — Z23 Encounter for immunization: Secondary | ICD-10-CM

## 2020-08-07 NOTE — Progress Notes (Signed)
   Covid-19 Vaccination Clinic  Name:  Katie [name]    MRN: [id number] DOB: 1946-05-21  08/07/2020  [name] was observed post Covid-19 immunization for 15 minutes without incident. She was provided with Vaccine Information Sheet and instruction to access the V-Safe system.   [name] was instructed to call 911 with any severe reactions post vaccine: Marland Kitchen Difficulty breathing  . Swelling of face and throat  . A fast heartbeat  . A bad rash all over body  . Dizziness and weakness

## 2020-09-04 ENCOUNTER — Telehealth: Payer: Self-pay

## 2020-09-04 NOTE — Telephone Encounter (Signed)
Please call patient back to assess. Cannot see her in office so would recommend at least UC evaluation today, ER if severe SOB.

## 2020-09-04 NOTE — Telephone Encounter (Signed)
Patient called in stating she has had a productive cough for two weeks and increased breathing difficulty.   Patient was transferred to [facility].    Nurse line called back stating that patient has a history of COPD.  States patient was advised to go to ED.   States patient refused requesting visit with [facility].  Please advise.

## 2020-09-06 NOTE — Telephone Encounter (Signed)
Spoke with patient and she says her cough and breathing has improved some but still occurring. She has been doing her breathing treatments and inhalers. She is scheduled for my chart video on Friday 09/07/20

## 2020-09-06 NOTE — Telephone Encounter (Signed)
Called patient left a detailed message on VM for patient to call back regarding her symptoms. If she is having any sob, will need to go to the ER. For the cough and uri symptoms would need to be seen at urgent care for evaluation and treatment

## 2020-09-07 ENCOUNTER — Encounter: Payer: Self-pay | Admitting: Physician Assistant

## 2020-09-07 ENCOUNTER — Telehealth: Payer: Medicare Other | Admitting: Physician Assistant

## 2020-11-06 ENCOUNTER — Telehealth: Payer: Self-pay

## 2020-11-06 NOTE — Progress Notes (Signed)
Chronic Care Management Pharmacy Assistant   Name: Katie [name]  MRN: [id number] DOB: 08-Jun-1946  Reason for Encounter: Disease State  PCP : Brunetta Jeans, PA-C  Allergies:   Allergies  Allergen Reactions  . Ace Inhibitors Other (See Comments)    Pseudoasthma/ renal failure     Medications: Outpatient Encounter Medications as of 11/06/2020  Medication Sig  . acetaminophen (TYLENOL) 500 MG tablet Take 1,000 mg by mouth every 6 (six) hours as needed for headache (pain).  Marland Kitchen albuterol (PROVENTIL) (2.5 MG/3ML) 0.083% nebulizer solution Take 3 mLs (2.5 mg total) by nebulization every 6 (six) hours as needed for wheezing or shortness of breath.  Marland Kitchen albuterol (VENTOLIN HFA) 108 (90 Base) MCG/ACT inhaler Inhale 2 puffs into the lungs every 6 (six) hours as needed for wheezing or shortness of breath.  Marland Kitchen aspirin EC 81 MG tablet Take 1 tablet (81 mg total) by mouth daily.  Marland Kitchen atorvastatin (LIPITOR) 40 MG tablet Take 1 tablet (40 mg total) by mouth daily.  . Ergocalciferol (VITAMIN D2) 50 MCG (2000 UT) TABS Take 1 capsule by mouth daily.  . Fluticasone-Umeclidin-Vilant (TRELEGY ELLIPTA) 100-62.5-25 MCG/INH AEPB 1 inhalation daily.  Marland Kitchen losartan (COZAAR) 50 MG tablet Take 1 tablet (50 mg total) by mouth daily.  . metoprolol tartrate (LOPRESSOR) 25 MG tablet Take 1 tablet (25 mg total) by mouth 2 (two) times daily.  . Misc. Devices (PULSE OXIMETER FOR FINGER) MISC 1 Device by Does not apply route as needed.  . Multiple Vitamin (MULTIVITAMIN WITH MINERALS) TABS tablet Take 1 tablet by mouth daily.  . sertraline (ZOLOFT) 25 MG tablet Take 1 tablet (25 mg total) by mouth daily.  Marland Kitchen triamcinolone (NASACORT) 55 MCG/ACT AERO nasal inhaler Place 2 sprays into the nose daily. (Patient taking differently: Place 2 sprays into the nose daily as needed (congestion). )  . Vitamin D, Ergocalciferol, (DRISDOL) 1.25 MG (50000 UNIT) CAPS capsule Take 1 capsule by mouth once week x 10 weeks   No  facility-administered encounter medications on file as of 11/06/2020.    Current Diagnosis: Patient Active Problem List   Diagnosis Date Noted  . AAA (abdominal aortic aneurysm) without rupture ([place]) 05/04/2020  . Acute respiratory failure with hypoxia ([place]) 04/23/2020  . Atrial fibrillation ([place]) 04/26/2019  . S/P minimally invasive mitral valve repair 04/20/2019  . Chronic diastolic congestive heart failure ([place])   . Pneumonia 04/04/2019  . Tobacco abuse 03/13/2019  . COPD with acute exacerbation ([place]) 03/13/2019  . Acute respiratory failure with hypoxia and hypercapnia ([place]) 03/13/2019  . COPD exacerbation ([place]) 03/13/2019  . Fx metatarsal 03/07/2019  . CKD (chronic kidney disease) stage 3, GFR 30-59 ml/min 09/29/2018  . Urinary urgency 06/11/2018  . Visit for preventive health examination 04/30/2017  . Osteoporosis 12/31/2016  . Arthralgia of both hands 11/11/2016  . Breast cancer screening 11/11/2016  . Chest pain 04/17/2016  . Hyperglycemia 08/03/2015  . Esophageal reflux   . Depression with anxiety   . Tobacco abuse disorder 06/27/2014  . Insomnia 07/15/2013  . Severe mitral regurgitation 09/29/2011  . COPD 09/26/2011  . Essential hypertension 09/26/2011  . HLD (hyperlipidemia) 09/26/2011  . S/P cholecystectomy 09/26/2011    Reviewed chart prior to disease state call. Spoke with patient regarding BP  Recent Office Vitals: BP Readings from Last 3 Encounters:  05/04/20 110/70  04/26/20 (!) 144/88  02/08/20 138/88   Pulse Readings from Last 3 Encounters:  05/04/20 77  04/26/20 74  02/08/20 85    Wt  Readings from Last 3 Encounters:  05/04/20 141 lb (64 kg)  04/23/20 144 lb 10 oz (65.6 kg)  02/08/20 146 lb (66.2 kg)     Kidney Function Lab Results  Component Value Date/Time   CREATININE 1.50 (H) 06/01/2020 09:36 AM   CREATININE 1.81 (H) 05/04/2020 11:39 AM   CREATININE 1.81 (H) 06/03/2016 04:31 PM   CREATININE 1.42 (H) 08/08/2013 08:43 AM   GFR 34.10 (L)  06/01/2020 09:36 AM   GFRNONAA 32 (L) 04/26/2020 02:46 AM   GFRAA 37 (L) 04/26/2020 02:46 AM    BMP Latest Ref Rng & Units 06/01/2020 05/04/2020 04/26/2020  Glucose 70 - 99 mg/dL 61(L) 104(H) 143(H)  BUN 6 - 23 mg/dL 26(H) 29(H) 29(H)  Creatinine 0.40 - 1.20 mg/dL 1.50(H) 1.81(H) 1.59(H)  BUN/Creat Ratio 12 - 28 - - -  Sodium 135 - 145 mEq/L 141 136 139  Potassium 3.5 - 5.1 mEq/L 4.1 4.7 4.2  Chloride 96 - 112 mEq/L 105 100 106  CO2 19 - 32 mEq/L 26 28 24   Calcium 8.4 - 10.5 mg/dL 9.6 9.6 9.7     . Current antihypertensive regimen:                    - losartan (COZAAR) 50 MG tablet                   -  metoprolol tartrate (LOPRESSOR) 25 MG tablet  . How often are you checking your Blood Pressure? infrequently   . Current home BP readings: Patient stated she has not been checking her blood pressure.  . What recent interventions/DTPs have been made by any provider to improve Blood Pressure control since last CPP Visit: None Noted  .  Marland Kitchen Any recent hospitalizations or ED visits since last visit with CPP? No  .  . What diet changes have been made to improve Blood Pressure Control?  - Patient stated no changes in Diet . What exercise is being done to improve your Blood Pressure Control?  o Patient stated no changes in exercise.  Adherence Review: Is the patient currently on ACE/ARB medication? Yes Does the patient have >5 day gap between last estimated fill dates? CPP to review   Patient scheduled a follow up visit with CPP on February 3rd at 9:00am .    Would like to speak to CPP regarding Trellegy-Ellipta medication. Also stated her insurance changed to [facility].   Patient would also like to discuss [facility] .   Georgiana Shore ,[place] Clinical Pharmacist Assistant [facility]   Follow-Up:  Pharmacist Review

## 2020-11-21 NOTE — Progress Notes (Signed)
Chronic Care Management Pharmacy  Name: Katie [name]  MRN: [id number] DOB: 1946/07/19  Chief Complaint/ HPI  Katie [name],  75 y.o. , female presents for their Follow-Up CCM visit with the clinical pharmacist via telephone due to COVID-19 Pandemic.  PCP : Brunetta Jeans, PA-C  Their chronic conditions include: Encounter Diagnoses  Name Primary?  Marland Kitchen COPD Yes  . Essential hypertension   . Hyperlipidemia, unspecified hyperlipidemia type   . Chronic diastolic congestive heart failure ([place])   . Atrial fibrillation, unspecified type ([place])   . Tobacco abuse disorder    Office Visits: 05/04/2020 (PCP): AAA is stable at 3.9 cm. Repeat US in 2 years to ensure stability. Really needs to completely cease smoking to hopefully help prevent worsening in this. Patient agreeable to refill atorvastatin  04/29/2020 ([facility]): TCM call 04/23/2020 (ED): COPD exacerbation - acute respiratory failure with hypoxia. EKG negative for ischemic changes, EF of 60-65% 02/08/2020 (PCP): Given rescue inhaler, decided to stop amlodipine, start back on losartan at lower dose - losartan 25 mg daily. Continued with metoprolol tartrate 78m twice daily. Taking Dulera twice weekly, asked to take 2 puffs twice daily. Referred to pulmonology.  11/22/2019 (PCP): advised D3 1000 units daily and ergo 50,000 units weekly x 10. Atorvastatin restarted.  07/18/2019 (Dr. ORoxy Manns Cardiothoracic Surgery): Would be reasonable to stop Warfarin, if agreed by Dr. ROval Linseyfollowing echocariogram.   Patient Active Problem List   Diagnosis Date Noted  . AAA (abdominal aortic aneurysm) without rupture ([place] 05/04/2020  . Acute respiratory failure with hypoxia ([place] 04/23/2020  . Atrial fibrillation ([place] 04/26/2019  . S/P minimally invasive mitral valve repair 04/20/2019  . Chronic diastolic congestive heart failure ([facility]   . Pneumonia 04/04/2019  . Tobacco abuse 03/13/2019  . COPD with acute exacerbation ([place] 03/13/2019  . Acute  respiratory failure with hypoxia and hypercapnia ([facility] 03/13/2019  . COPD exacerbation ([facility] 03/13/2019  . Fx metatarsal 03/07/2019  . CKD (chronic kidney disease) stage 3, GFR 30-59 ml/min 09/29/2018  . Urinary urgency 06/11/2018  . Visit for preventive health examination 04/30/2017  . Osteoporosis 12/31/2016  . Arthralgia of both hands 11/11/2016  . Breast cancer screening 11/11/2016  . Chest pain 04/17/2016  . Hyperglycemia 08/03/2015  . Esophageal reflux   . Depression with anxiety   . Tobacco abuse disorder 06/27/2014  . Insomnia 07/15/2013  . Severe mitral regurgitation 09/29/2011  . COPD 09/26/2011  . Essential hypertension 09/26/2011  . HLD (hyperlipidemia) 09/26/2011  . S/P cholecystectomy 09/26/2011   Past Surgical History:  Procedure Laterality Date  . APPENDECTOMY  1970's  . BUBBLE STUDY  03/18/2019   Procedure: BUBBLE STUDY;  Surgeon: RFay Records MD;  [facility] [facility]  Service: Cardiovascular;;  . CARDIAC CATHETERIZATION  09/2011  . CHOLECYSTECTOMY  01/2010  . DIAGNOSTIC LAPAROSCOPY    . KIDNEY SURGERY    . LEFT AND RIGHT HEART CATHETERIZATION WITH CORONARY ANGIOGRAM N/A 09/30/2011   Procedure: LEFT AND RIGHT HEART CATHETERIZATION WITH CORONARY ANGIOGRAM;  Surgeon: MCandee Furbish MD;  [facility];  Service: Cardiovascular;  Laterality: N/A;  . MITRAL VALVE REPAIR Right 04/20/2019   Procedure: MINIMALLY INVASIVE MITRAL VALVE REPAIR (MVR) USING MEMO 4D SIZE 30;  Surgeon: ORexene Alberts MD;  [place] [facility]  Service: Open Heart Surgery;  Laterality: Right;  . MULTIPLE EXTRACTIONS WITH ALVEOLOPLASTY  10/06/2011   Procedure: MULTIPLE EXTRACION WITH ALVEOLOPLASTY;  Surgeon: RLenn Cal DDS;  [place] [facility]  Service: Oral Surgery;  Laterality: N/A;  Multiple extraction of tooth #'s 1, 6, 8, 10, 11, 22, 23, 26, 27, 28, 29 with alveoloplasty and Upper right buccal exostoses reductions.  . NEPHRECTOMY RADICAL  01/2010   right   . OVARIAN CYST  REMOVAL  1970's   "went through belly button"  . RIGHT/LEFT HEART CATH AND CORONARY ANGIOGRAPHY N/A 03/22/2019   Procedure: RIGHT/LEFT HEART CATH AND CORONARY ANGIOGRAPHY;  Surgeon: [name], MD;  Location: [place] CV LAB;  Service: Cardiovascular;  Laterality: N/A;  . TEE WITHOUT CARDIOVERSION  10/01/2011   Procedure: TRANSESOPHAGEAL ECHOCARDIOGRAM (TEE);  Surgeon: [name];  Location: [place];  Service: Cardiovascular;  Laterality: N/A;  . TEE WITHOUT CARDIOVERSION N/A 03/18/2019   Procedure: TRANSESOPHAGEAL ECHOCARDIOGRAM (TEE);  Surgeon: Fay Records, MD;  Location: [place];  Service: Cardiovascular;  Laterality: N/A;  . TEE WITHOUT CARDIOVERSION N/A 04/20/2019   Procedure: TRANSESOPHAGEAL ECHOCARDIOGRAM (TEE);  Surgeon: Rexene Alberts, MD;  Location: [place];  Service: Open Heart Surgery;  Laterality: N/A;  . TUBAL LIGATION  1970's  . US ECHOCARDIOGRAPHY  09/2011   Social History   Socioeconomic History  . Marital status: Married    Spouse name: Not on file  . Number of children: 3  . Years of education: 3  . Highest education level: Not on file  Occupational History  . 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 Smoking status: Current Some Day Smoker    Packs/day: 0.25    Years: 53.00    Pack years: 13.25    Types: Cigarettes    Last attempt to quit: 03/18/2019    Years since quitting: 1.6  . Smokeless tobacco: Never Used  Vaping Use  . Vaping Use: Never used  Substance and Sexual Activity  . Alcohol use: Yes    Alcohol/week: 0.0 standard drinks    Comment:  "drink very rarely"  . Drug use: Not Currently    Types: Cocaine    Comment: hx of cocaine last in 2006  . Sexual activity: Never  Other Topics Concern  . Not on file  Social History Narrative          Lives in [place] with her husband.    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    Patient has 3 grown children that do not live with her.    Patient does not have any medical insurance.   Social Determinants of Health   Financial Resource Strain: Not on file  Food Insecurity: No Food Insecurity  . Worried About Charity fundraiser in the Last Year: Never true  . Ran Out of Food in the Last Year: Never true  Transportation Needs: No Transportation Needs  . Lack of Transportation (Medical): No  . Lack of Transportation (Non-Medical): No  Physical Activity: Not on file  Stress: Not on file  Social Connections: Not on file   Family History  Problem Relation Age of Onset  . COPD Mother        DECEASED/SMOKED  . Diabetes Brother   . Diabetes Brother    Allergies  Allergen Reactions  . Ace Inhibitors Other (See Comments)    Pseudoasthma/ renal failure    Outpatient Encounter Medications as of 11/22/2020  Medication Sig  . acetaminophen (TYLENOL) 500 MG tablet Take 1,000 mg by mouth every 6 (six) hours as needed for headache (pain).  Marland Kitchen albuterol (PROVENTIL) (2.5  MG/3ML) 0.083% nebulizer solution Take 3 mLs (2.5 mg total) by nebulization every 6 (six) hours as needed for wheezing or shortness of breath.  Marland Kitchen albuterol (VENTOLIN HFA) 108 (90 Base) MCG/ACT inhaler Inhale 2 puffs into the lungs every 6 (six) hours as needed for wheezing or shortness of breath.  Marland Kitchen aspirin EC 81 MG tablet Take 1 tablet (81 mg total) by mouth daily.  Marland Kitchen atorvastatin (LIPITOR) 40 MG tablet Take 1 tablet (40 mg total) by mouth daily.  . Ergocalciferol (VITAMIN D2) 50 MCG (2000 UT) TABS Take 1 capsule by mouth daily.  . Fluticasone-Umeclidin-Vilant (TRELEGY ELLIPTA) 100-62.5-25 MCG/INH AEPB 1 inhalation daily.  Marland Kitchen losartan (COZAAR) 50 MG tablet Take 1 tablet (50 mg total) by mouth daily.  . metoprolol tartrate (LOPRESSOR) 25 MG tablet Take 1 tablet (25 mg total) by mouth 2 (two) times daily.  . Misc. Devices (PULSE OXIMETER FOR FINGER) MISC 1 Device by Does not apply route as  needed.  . Multiple Vitamin (MULTIVITAMIN WITH MINERALS) TABS tablet Take 1 tablet by mouth daily.  . sertraline (ZOLOFT) 25 MG tablet Take 1 tablet (25 mg total) by mouth daily.  Marland Kitchen triamcinolone (NASACORT) 55 MCG/ACT AERO nasal inhaler Place 2 sprays into the nose daily. (Patient taking differently: Place 2 sprays into the nose daily as needed (congestion). )  . Vitamin D, Ergocalciferol, (DRISDOL) 1.25 MG (50000 UNIT) CAPS capsule Take 1 capsule by mouth once week x 10 weeks   No facility-administered encounter medications on file as of 11/22/2020.   Patient Care Team    Relationship Specialty Notifications Start End  Brunetta Jeans, [place] PCP - General Family Medicine  03/06/19    Comment: Bernerd Limbo, Jonelle Sidle, MD PCP - Cardiology Cardiology Admissions 03/15/19   Comer, Okey Regal, MD  Internal Medicine  11/24/11   Skeet Latch, MD Attending Physician Cardiology  06/04/16   Elmarie Shiley, MD Consulting Physician Nephrology  04/29/17   Brunetta Jeans, PA-C  Physician Assistant  03/06/19    Comment: Merged (Iona [facility])  Madelin Rear, [facility] Pharmacist Pharmacist Admissions 01/31/20    Comment: PHONE NUMBER [phone]  Madelin Rear, [facility] Pharmacist Pharmacist  02/29/20    Comment: PHONE NUMBER [phone]   Current Diagnosis/Assessment:  Hypertension   BP goal < 140/90   BP Readings from Last 3 Encounters:  05/04/20 110/70  04/26/20 (!) 144/88  02/08/20 138/88   Lab Results  Component Value Date   CREATININE 1.50 (H) 06/01/2020   BUN 26 (H) 06/01/2020   GFR 34.10 (L) 06/01/2020   GFRNONAA 32 (L) 04/26/2020   GFRAA 37 (L) 04/26/2020   NA 141 06/01/2020   K 4.1 06/01/2020   CALCIUM 9.6 06/01/2020   CO2 26 06/01/2020   BMP Latest Ref Rng & Units 06/01/2020 05/04/2020 04/26/2020  Glucose 70 - 99 mg/dL 61(L) 104(H) 143(H)  BUN 6 - 23 mg/dL 26(H) 29(H) 29(H)  Creatinine 0.40 - 1.20 mg/dL 1.50(H) 1.81(H) 1.59(H)  BUN/Creat Ratio 12 - 28 - - -  Sodium 135 - 145 mEq/L 141 136 139   Potassium 3.5 - 5.1 mEq/L 4.1 4.7 4.2  Chloride 96 - 112 mEq/L 105 100 106  CO2 19 - 32 mEq/L 26 28 24   Calcium 8.4 - 10.5 mg/dL 9.6 9.6 9.7   Recent home readings: has not been taking, states she would have headaches if BP was to be elevated.  Previously referred to nephrology 06/05/2020 but consultation refused. Denies dizziness. Due for routine labs. Patient is currently near goal on  the following medications:   Metoprolol tartrate  25 mg twice daily  Losartan 50 mg in the morning   Discussed need for TOC at length due to patient's current PCP no longer being at practice. Information on several [place] practices has been provided.   Plan  Continue current medications.  Pt to f/u on PCP TOC. Pt to f/u on nephrology referral.    Tobacco Abuse   Social History   Tobacco Use  Smoking Status Current Some Day Smoker  . Packs/day: 0.25  . Years: 53.00  . Pack years: 13.25  . Types: Cigarettes  . Last attempt to quit: 03/18/2019  . Years since quitting: 1.6  Smokeless Tobacco Never Used  Previous medications: chantix starter dose to maintenance which wasn't tolerated (made patient sick), bupropion.   04/2020 - reported reduction from 1 pack per day to 1 cigarette or less per day now.  11/22/2020 - smoking half pack per day.   Patient smokes After 30 minutes of waking. Patient triggers include: drinking coffee.  Patient is currently not on any medication therapy.  Reviewed previous medication therapies and potential future NRT options. If patient is willing to consider, would encourage  Participation in [place] smoking cessation classes where NRT would be provided - open to all community members - [email or web address] or call [phone] ([phone]).   Plan  Patient to consider contacting [email or web address] or call [phone] ([phone]).  COPD   Previous medications: Symbicort, Duoneb, Combivent, Advair, Dulera.  Denies worsening symptoms, COPD assessment  not completed today. No show to previous referral to pulmonology. Patient is currently taking the following medications:   Trelegy Ellipta (100-62.5-25) 1 inhalation daily   Albuterol rescue inhaler 2 puffs every 6 hours as needed  Albuterol (PROVENTIL) (2.5 MG/3ML) 0.083% nebulizer solution 3 mLs every 6 hours as needed  Reviewed inhaler therapy and answered pt questions.   Plan  Continue current medications.  Recommend referral to pulmonology if wanting to reconsider. Recommend smoking cessation class.   Hyperlipidemia   LDL goal < 70  Last lipids Lab Results  Component Value Date   CHOL 307 (H) 10/31/2019   HDL 62.60 10/31/2019   LDLCALC 115 (H) 09/29/2018   LDLDIRECT 151.0 10/31/2019   TRIG 342.0 (H) 10/31/2019   CHOLHDL 5 10/31/2019   Hepatic Function Latest Ref Rng & Units 06/01/2020 05/04/2020 10/31/2019  Total Protein 6.0 - 8.3 g/dL 6.4 6.6 7.2  Albumin 3.5 - 5.2 g/dL 4.0 4.0 4.1  AST 0 - 37 U/L 18 15 15   ALT 0 - 35 U/L 13 16 8   Alk Phosphatase 39 - 117 U/L 74 73 98  Total Bilirubin 0.2 - 1.2 mg/dL 0.3 0.4 0.3    AAA. HTN, active smoker. Previously not at goal, unable to assess current control due to patient needing follow-up labs. currently taking:  . Atorvastatin 40 mg once daily  CVD prevention. On asa. Denies any abnormal bruising, bleeding from nose or gums or blood in urine or stool. Patient is currently controlled on the following medications:  . Aspirin 81 mg once daily  Reviewed side effects/tolerability - no problems noted.  Plan  Continue current medications. Pt to schedule TOC with new PCP.  Depression    PHQ9 SCORE ONLY 11/22/2020 05/01/2020 02/08/2020  PHQ-9 Total Score 0 10 0  Previous medications: bupropion.  Reports ongoing benefit of antidepressant therapy. Denies side effects. Patient is currently controlled on the following medications:  . Sertraline 25 mg once daily (04/2020)  Counseled on sertraline therapy. Reviewed potential  for  future titration should this be needed.  Plan  Continue current management.  AFIB   Pulse Readings from Last 3 Encounters:  05/04/20 77  04/26/20 74  02/08/20 85   Patient is currently rate controlled on the following medications:  Marland Kitchen Metoprolol tartrate 25 mg twice daily   Stroke prevention. CHA2DS2-VASc Score = 4. Previously anticoagulated with warfarin however discontinued by Dr. Gaetano Hawthorne after series of normal EKGs. Patient is currently controlled on the following medications:  . N/a.  Plan  Continue current medications.   Osteopenia / Osteoporosis   Last DEXA Scan: 2018.   T-Score femoral neck: .-3.0  T-Score lumbar spine: -1.9   VITD  Date Value Ref Range Status  05/04/2020 27.03 (L) 30.00 - 100.00 ng/mL Final   Lab Results  Component Value Date   CREATININE 1.50 (H) 06/01/2020   BUN 26 (H) 06/01/2020   GFR 34.10 (L) 06/01/2020   GFRNONAA 32 (L) 04/26/2020   GFRAA 37 (L) 04/26/2020   NA 141 06/01/2020   K 4.1 06/01/2020   CALCIUM 9.6 06/01/2020   CO2 26 06/01/2020    Patient has failed these meds in past: Fosamax.  CKD 3b, current smoker. Patient is currently uncontrolled on the following medications: n/a Willing to start on injectable, last labs 8 months ago and may need consultation if borderline kidney function.  Has extra help for copays.   We discussed: Counseled on fracture risk related to osteoporosis.    Plan  Recommend pt to follow-up on kidney referral.   Vaccines   Immunization History  Administered Date(s) Administered  . Fluad Quad(high Dose 65+) 10/31/2019  . Influenza Split 09/27/2011  . Influenza, High Dose Seasonal PF 08/29/2017, 10/04/2018  . Influenza,inj,Quad PF,6+ Mos 07/14/2013, 01/02/2015, 09/10/2016, 08/06/2017  . PFIZER(Purple Top)SARS-COV-2 Vaccination 12/03/2019, 12/31/2019, 08/07/2020  . Pneumococcal Conjugate-13 04/29/2017  . Pneumococcal Polysaccharide-23 09/27/2011, 08/15/2013  . Tdap 10/09/2011   Reviewed  vaccination history.  Up to date with exception of shingrix.   Plan  Recommended patient receive Shingrix vaccine in pharmacy.   Medication Management   Patient's preferred pharmacy is:  [facility] ([place] [place] - [address] [place] ([place]) [place] [address] Phone: [phone] Fax: [phone]  Receiving extra help through ins., upstream pharmacy would be preferred. Medication management reviewed, will await outcome of PCP TOC.   Plan  Continue current medication management strategy  Follow up:  CPA. 1 month phone call. Ensure PCP TOC.  ______________ Visit Information SDOH (Social Determinants of Health) assessments performed: Yes.  No future appointments.  Madelin Rear, Pharm.D., BCGP Clinical Pharmacist [facility] [phone]

## 2020-11-22 ENCOUNTER — Ambulatory Visit: Payer: Medicare HMO

## 2020-11-22 DIAGNOSIS — I4891 Unspecified atrial fibrillation: Secondary | ICD-10-CM

## 2020-11-22 DIAGNOSIS — I10 Essential (primary) hypertension: Secondary | ICD-10-CM

## 2020-11-22 DIAGNOSIS — Z720 Tobacco use: Secondary | ICD-10-CM

## 2020-11-22 DIAGNOSIS — I5032 Chronic diastolic (congestive) heart failure: Secondary | ICD-10-CM

## 2020-11-22 DIAGNOSIS — J449 Chronic obstructive pulmonary disease, unspecified: Secondary | ICD-10-CM

## 2020-11-22 DIAGNOSIS — E785 Hyperlipidemia, unspecified: Secondary | ICD-10-CM

## 2020-11-26 ENCOUNTER — Other Ambulatory Visit: Payer: Self-pay | Admitting: *Deleted

## 2020-11-26 DIAGNOSIS — F17210 Nicotine dependence, cigarettes, uncomplicated: Secondary | ICD-10-CM

## 2020-11-26 DIAGNOSIS — Z87891 Personal history of nicotine dependence: Secondary | ICD-10-CM

## 2020-12-07 ENCOUNTER — Inpatient Hospital Stay: Admission: RE | Admit: 2020-12-07 | Payer: Medicare HMO | Source: Ambulatory Visit

## 2020-12-18 ENCOUNTER — Telehealth: Payer: Self-pay

## 2020-12-18 NOTE — Chronic Care Management (AMB) (Signed)
Chronic Care Management Pharmacy Assistant   Name: Katie [name]  MRN: [id number] DOB: 10-16-1946  Reason for Encounter: Disease State/ General Adherence Call  PCP : Brunetta Jeans, PA-C  Allergies:   Allergies  Allergen Reactions  . Ace Inhibitors Other (See Comments)    Pseudoasthma/ renal failure     Medications: Outpatient Encounter Medications as of 12/18/2020  Medication Sig  . acetaminophen (TYLENOL) 500 MG tablet Take 1,000 mg by mouth every 6 (six) hours as needed for headache (pain).  Marland Kitchen albuterol (PROVENTIL) (2.5 MG/3ML) 0.083% nebulizer solution Take 3 mLs (2.5 mg total) by nebulization every 6 (six) hours as needed for wheezing or shortness of breath.  Marland Kitchen albuterol (VENTOLIN HFA) 108 (90 Base) MCG/ACT inhaler Inhale 2 puffs into the lungs every 6 (six) hours as needed for wheezing or shortness of breath.  Marland Kitchen aspirin EC 81 MG tablet Take 1 tablet (81 mg total) by mouth daily.  Marland Kitchen atorvastatin (LIPITOR) 40 MG tablet Take 1 tablet (40 mg total) by mouth daily.  . Ergocalciferol (VITAMIN D2) 50 MCG (2000 UT) TABS Take 1 capsule by mouth daily.  . Fluticasone-Umeclidin-Vilant (TRELEGY ELLIPTA) 100-62.5-25 MCG/INH AEPB 1 inhalation daily.  Marland Kitchen losartan (COZAAR) 50 MG tablet Take 1 tablet (50 mg total) by mouth daily.  . metoprolol tartrate (LOPRESSOR) 25 MG tablet Take 1 tablet (25 mg total) by mouth 2 (two) times daily.  . Misc. Devices (PULSE OXIMETER FOR FINGER) MISC 1 Device by Does not apply route as needed.  . Multiple Vitamin (MULTIVITAMIN WITH MINERALS) TABS tablet Take 1 tablet by mouth daily.  . sertraline (ZOLOFT) 25 MG tablet Take 1 tablet (25 mg total) by mouth daily.  Marland Kitchen triamcinolone (NASACORT) 55 MCG/ACT AERO nasal inhaler Place 2 sprays into the nose daily. (Patient taking differently: Place 2 sprays into the nose daily as needed (congestion). )  . Vitamin D, Ergocalciferol, (DRISDOL) 1.25 MG (50000 UNIT) CAPS capsule Take 1 capsule by mouth once week x 10  weeks   No facility-administered encounter medications on file as of 12/18/2020.    Current Diagnosis: Patient Active Problem List   Diagnosis Date Noted  . AAA (abdominal aortic aneurysm) without rupture ([place]) 05/04/2020  . Acute respiratory failure with hypoxia ([place]) 04/23/2020  . Atrial fibrillation ([place]) 04/26/2019  . S/P minimally invasive mitral valve repair 04/20/2019  . Chronic diastolic congestive heart failure ([place])   . Pneumonia 04/04/2019  . Tobacco abuse 03/13/2019  . COPD with acute exacerbation ([place]) 03/13/2019  . Acute respiratory failure with hypoxia and hypercapnia ([place]) 03/13/2019  . COPD exacerbation ([place]) 03/13/2019  . Fx metatarsal 03/07/2019  . CKD (chronic kidney disease) stage 3, GFR 30-59 ml/min 09/29/2018  . Urinary urgency 06/11/2018  . Visit for preventive health examination 04/30/2017  . Osteoporosis 12/31/2016  . Arthralgia of both hands 11/11/2016  . Breast cancer screening 11/11/2016  . Chest pain 04/17/2016  . Hyperglycemia 08/03/2015  . Esophageal reflux   . Depression with anxiety   . Tobacco abuse disorder 06/27/2014  . Insomnia 07/15/2013  . Severe mitral regurgitation 09/29/2011  . COPD 09/26/2011  . Essential hypertension 09/26/2011  . HLD (hyperlipidemia) 09/26/2011  . S/P cholecystectomy 09/26/2011    Reviewed chart for medication changes ahead of adherence call.  No OVs, Consults, or hospital visits since last care coordination call/Pharmacist visit.  No medication changes indicated.  Patient Questions: Have you had any problems recently with your health? Patient states she is having some trouble with her breathing. Patient  states she recently saw Dr. Sharlet [place]ina and was prescribed Prednisone, she states she is going to pick this today.  Have you had any problems with your pharmacy? Patient states she recently had an issue with the price of her inhaler which has since been resolved.  What issues or side effects are you having  with your medications? Patient states she is not currently having any issues or side effects from any of her medications.  What would you like me to pass along to Madelin Rear, CPP for him to help you with?  Patient states she does not have anything to pass along at this time.  What can we do to take care of you better? Patient states she is doing well at this time.  April D Calhoun, [place] Pharmacist Assistant [phone]    Follow-Up:  Pharmacist Review

## 2020-12-20 ENCOUNTER — Telehealth: Payer: Self-pay | Admitting: Physician Assistant

## 2020-12-20 DIAGNOSIS — J449 Chronic obstructive pulmonary disease, unspecified: Secondary | ICD-10-CM

## 2020-12-20 NOTE — Telephone Encounter (Signed)
..  Medication Refills  Last OV:  Medication:  [facility]:  [facility]  Let patient know to contact pharmacy at the end of the day to make sure medication is ready.   Please notify patient to allow 48-72 hours to process.  Encourage patient to contact the pharmacy for refills or they can request refills through [place] out below:   Last refill:  QTY:  Refill Date:    Other Comments:   Okay for refill?  Please advise.

## 2020-12-21 MED ORDER — TRELEGY ELLIPTA 100-62.5-25 MCG/INH IN AEPB: 1.0000 | INHALATION_SPRAY | Freq: Every day | RESPIRATORY_TRACT | 3 refills | Status: DC

## 2020-12-21 NOTE — Telephone Encounter (Signed)
Rx sent to the preferred patient pharmacy  

## 2021-01-04 ENCOUNTER — Telehealth: Payer: Self-pay

## 2021-01-04 ENCOUNTER — Other Ambulatory Visit: Payer: Self-pay

## 2021-01-04 ENCOUNTER — Ambulatory Visit (INDEPENDENT_AMBULATORY_CARE_PROVIDER_SITE_OTHER): Payer: Medicare HMO | Admitting: Internal Medicine

## 2021-01-04 ENCOUNTER — Encounter: Payer: Self-pay | Admitting: Internal Medicine

## 2021-01-04 DIAGNOSIS — R0782 Intercostal pain: Secondary | ICD-10-CM | POA: Diagnosis not present

## 2021-01-04 DIAGNOSIS — J410 Simple chronic bronchitis: Secondary | ICD-10-CM

## 2021-01-04 DIAGNOSIS — J441 Chronic obstructive pulmonary disease with (acute) exacerbation: Secondary | ICD-10-CM

## 2021-01-04 MED ORDER — PREDNISONE 20 MG PO TABS
40.0000 mg | ORAL_TABLET | Freq: Every day | ORAL | 0 refills | Status: DC
Start: 2021-01-04 — End: 2021-05-15

## 2021-01-04 MED ORDER — BENZONATATE 200 MG PO CAPS: 200.0000 mg | ORAL_CAPSULE | Freq: Three times a day (TID) | ORAL | 0 refills | Status: DC | PRN

## 2021-01-04 MED ORDER — CYCLOBENZAPRINE HCL 10 MG PO TABS
10.0000 mg | ORAL_TABLET | Freq: Three times a day (TID) | ORAL | 0 refills | Status: DC | PRN
Start: 2021-01-04 — End: 2021-07-24

## 2021-01-04 NOTE — Progress Notes (Signed)
   Subjective:   Patient ID: Katie [name], female    DOB: 05-27-1946, 75 y.o.   MRN: [id number]  HPI The patient is a 75 YO female coming in for concerns of right chest wall pain (fell on Tuesday over some broken furniture which was in the way and she could not catch herself, denies hitting head or LOC, pain in the right chest wall which did not start until the next day, felt okay immediately afterwards, pain with coughing, no pain with deep breathing, has tried tylenol without relief) and cough (this is chronic but worse than usual lately, smoking about the same as normal, is taking trelegy but has concerns about cost she went to fill this recently and they tried to charge her $700, this cough is causing severe pain in the chest wall from her fall) and smoking (she is smoking some, has quit in the past, decided she will die of something and wants to smoke still, is having some SOB and cough chronically, watched mom with bad COPD on oxygen and she did pass away abruptly and she would be okay with similar).   Review of Systems  Constitutional: Negative.   HENT: Negative.   Eyes: Negative.   Respiratory: Positive for cough and shortness of breath. Negative for chest tightness.   Cardiovascular: Positive for chest pain. Negative for palpitations and leg swelling.  Gastrointestinal: Negative for abdominal distention, abdominal pain, constipation, diarrhea, nausea and vomiting.  Musculoskeletal: Negative.   Skin: Negative.   Neurological: Negative.   Psychiatric/Behavioral: Negative.     Objective:  Physical Exam Constitutional:      Appearance: She is well-developed.  HENT:     Head: Normocephalic and atraumatic.  Cardiovascular:     Rate and Rhythm: Normal rate and regular rhythm.  Pulmonary:     Effort: Pulmonary effort is normal. No respiratory distress.     Breath sounds: Wheezing present. No rales.     Comments: Moderate expiratory wheezing bilaterally, worse on the left. Chest wall  tenderness right sided  Chest:     Chest wall: Tenderness present.  Abdominal:     General: Bowel sounds are normal. There is no distension.     Palpations: Abdomen is soft.     Tenderness: There is no abdominal tenderness. There is no rebound.  Musculoskeletal:     Cervical back: Normal range of motion.  Skin:    General: Skin is warm and dry.  Neurological:     Mental Status: She is alert and oriented to person, place, and time.     Coordination: Coordination normal.     Vitals:   01/04/21 1036  BP: 128/88  Pulse: 71  Resp: 18  Temp: 98 F (36.7 C)  TempSrc: Oral  SpO2: 97%  Weight: 146 lb 9.6 oz (66.5 kg)  Height: 5\' 1"  (1.549 m)    This visit occurred during the SARS-CoV-2 public health emergency.  Safety protocols were in place, including screening questions prior to the visit, additional usage of staff PPE, and extensive cleaning of exam room while observing appropriate contact time as indicated for disinfecting solutions.   Assessment & Plan:

## 2021-01-04 NOTE — Assessment & Plan Note (Signed)
Rx prednisone to treat potential flare of COPD causing worsening cough. She is asked to stop smoking and explained timeline for lung recovery. Explained also that most people with COPD do not drop dead and typically spend many years struggling with the sensation of breathlessness which she currently has mild but not severe. She will think about this but cannot commit to stop date at this time. Several behavioral suggestions offered.

## 2021-01-04 NOTE — Patient Instructions (Signed)
We have sent in prednisone to take 2 pills daily for 5 days.  We have sent in tessalon perles to use up to 3 times a day to reduce the cough.

## 2021-01-04 NOTE — Telephone Encounter (Cosign Needed)
I called and left a message for the patient to return call.   April D Calhoun, [place] Pharmacist Assistant [phone]

## 2021-01-04 NOTE — Telephone Encounter (Signed)
Spoke with pharmacy. Medication had been on hold per pharmacy technician. They reprocessed trelegy Rx and it went through for $9.95. They infomred me they could have filled for patient within next half hour.

## 2021-01-04 NOTE — Assessment & Plan Note (Signed)
Rx flexeril for likely muscular strain. She does not have evidence for rib fracture and through shared decision making getting x-ray to rule out fracture would not change management and we elected not to do this.

## 2021-01-04 NOTE — Telephone Encounter (Signed)
-----   Message from Hoyt Koch, MD sent at 01/04/2021 12:40 PM EDT ----- This lady is seeing you and went to fill trelegy and they told her it would be $700. Can you look into this and inform PCP of results and patient. Thanks

## 2021-01-04 NOTE — Assessment & Plan Note (Signed)
Will treat with prednisone for possible exacerbation. Counseled about need to quit smoking. Will forward message to her pharmacist at her regular office to check in about trelegy coverage and if PA is needed.
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
## 2021-01-31 DIAGNOSIS — E559 Vitamin D deficiency, unspecified: Secondary | ICD-10-CM | POA: Diagnosis not present

## 2021-01-31 DIAGNOSIS — E785 Hyperlipidemia, unspecified: Secondary | ICD-10-CM | POA: Diagnosis not present

## 2021-01-31 DIAGNOSIS — I110 Hypertensive heart disease with heart failure: Secondary | ICD-10-CM | POA: Diagnosis not present

## 2021-01-31 DIAGNOSIS — I272 Pulmonary hypertension, unspecified: Secondary | ICD-10-CM | POA: Diagnosis not present

## 2021-01-31 DIAGNOSIS — Z79899 Other long term (current) drug therapy: Secondary | ICD-10-CM | POA: Diagnosis not present

## 2021-01-31 DIAGNOSIS — E663 Overweight: Secondary | ICD-10-CM | POA: Diagnosis not present

## 2021-01-31 DIAGNOSIS — I714 Abdominal aortic aneurysm, without rupture: Secondary | ICD-10-CM | POA: Diagnosis not present

## 2021-01-31 DIAGNOSIS — F17210 Nicotine dependence, cigarettes, uncomplicated: Secondary | ICD-10-CM | POA: Diagnosis not present

## 2021-01-31 DIAGNOSIS — D6869 Other thrombophilia: Secondary | ICD-10-CM | POA: Diagnosis not present

## 2021-01-31 DIAGNOSIS — I130 Hypertensive heart and chronic kidney disease with heart failure and stage 1 through stage 4 chronic kidney disease, or unspecified chronic kidney disease: Secondary | ICD-10-CM | POA: Diagnosis not present

## 2021-03-01 ENCOUNTER — Other Ambulatory Visit: Payer: Self-pay

## 2021-03-01 NOTE — Patient Outreach (Signed)
[facility]) Care Management  03/01/2021  Autum Benfer 1946-10-01 [id number]   Telephone Screen  Referral Date: 03/01/2021 Referral Source:High Risk Report Insurance: [facility]   Outreach attempt #  1 to patient. No answer at present and voicemail box full.     Plan: RN CM will make outreach attempt to patient within 3-4 business days. RN CM will send unsuccessful outreach letter to patient.   Enzo Montgomery, RN,BSN,CCM [place] Management Telephonic Care Management Coordinator Direct Phone: [phone] Toll Free: [phone] Fax: [phone]

## 2021-03-04 ENCOUNTER — Other Ambulatory Visit: Payer: Self-pay

## 2021-03-04 NOTE — Patient Outreach (Signed)
[place] [facility]) Care Management  03/04/2021  Katie [name] 01/22/1946 [id number]   Telephone Screen  Referral Date: 03/01/2021 Referral Source:High Risk Report Insurance: [facility]    Outreach attempt # 2 to patient. Female answered the phone and reported patient was not available.     Plan: RN CM will make outreach attempt to patient within 3-4 business days.   Enzo Montgomery, RN,BSN,CCM [place] Management Telephonic Care Management Coordinator Direct Phone: [phone] Toll Free: [phone] Fax: [phone]

## 2021-03-05 ENCOUNTER — Other Ambulatory Visit: Payer: Self-pay

## 2021-03-05 NOTE — Patient Outreach (Signed)
[facility] [facility]) Care Management  03/05/2021  Katie [name] [birth year] [id number]   Telephone Screen  Referral Date:03/01/2021 Referral Source:High Risk Report Insurance:[employer]   Unsuccessful outreach attempt to patient.   Plan: RN CM will make outreach attempt to patient within 3-4 wks if no response from letter mailed to patient.  Enzo Montgomery, RN,BSN,CCM [place] Management Telephonic Care Management Coordinator Direct Phone: [phone] Toll Free: [phone] Fax: [phone]

## 2021-03-06 ENCOUNTER — Ambulatory Visit: Payer: Self-pay

## 2021-03-27 ENCOUNTER — Other Ambulatory Visit: Payer: Self-pay

## 2021-03-27 NOTE — Patient Outreach (Signed)
[facility]) Care Management  03/27/2021  Solana Coggin [birth year] [id number]   Telephone Screen  Referral Date:03/01/2021 Referral Source:High Risk Report Insurance:[employer]   Multiple attempts to establish contact with patient without success. No response from letter mailed to patient. Case is being closed at this time.    Plan: RN CM will close case at this time.   Enzo Montgomery, RN,BSN,CCM [place] Management Telephonic Care Management Coordinator Direct Phone: [phone] Toll Free: [phone] Fax: [phone]

## 2021-04-10 ENCOUNTER — Telehealth: Payer: Self-pay

## 2021-04-10 NOTE — Chronic Care Management (AMB) (Signed)
Chronic Care Management Pharmacy Assistant   Name: Katie [name]  MRN: [id number] DOB: 07-21-1946  Reason for Encounter: Hypertension Disease State Call  Recent office visits:  01/04/21- Pricilla Holm, MD ([name])- seen for right chest wall pain, started benzonatate 200 mg tid prn, started cyclobenzaprine 10 mg tid prn, short course prednisone 40 mg x 5 days, no follow up documented   Recent consult visits:  No visits noted   Hospital visits:  None in previous 6 months  Medications: Outpatient Encounter Medications as of 04/10/2021  Medication Sig   acetaminophen (TYLENOL) 500 MG tablet Take 1,000 mg by mouth every 6 (six) hours as needed for headache (pain).   albuterol (PROVENTIL) (2.5 MG/3ML) 0.083% nebulizer solution Take 3 mLs (2.5 mg total) by nebulization every 6 (six) hours as needed for wheezing or shortness of breath.   albuterol (VENTOLIN HFA) 108 (90 Base) MCG/ACT inhaler Inhale 2 puffs into the lungs every 6 (six) hours as needed for wheezing or shortness of breath.   aspirin EC 81 MG tablet Take 1 tablet (81 mg total) by mouth daily.   atorvastatin (LIPITOR) 40 MG tablet Take 1 tablet (40 mg total) by mouth daily.   benzonatate (TESSALON) 200 MG capsule Take 1 capsule (200 mg total) by mouth 3 (three) times daily as needed.   cyclobenzaprine (FLEXERIL) 10 MG tablet Take 1 tablet (10 mg total) by mouth 3 (three) times daily as needed for muscle spasms.   Ergocalciferol (VITAMIN D2) 50 MCG (2000 UT) TABS Take 1 capsule by mouth daily.   Fluticasone-Umeclidin-Vilant (TRELEGY ELLIPTA) 100-62.5-25 MCG/INH AEPB Inhale 1 puff into the lungs daily. Further refills will need to come from new primary provider   losartan (COZAAR) 50 MG tablet Take 1 tablet (50 mg total) by mouth daily.   metoprolol tartrate (LOPRESSOR) 25 MG tablet Take 1 tablet (25 mg total) by mouth 2 (two) times daily.   Misc. Devices (PULSE OXIMETER FOR FINGER) MISC 1 Device by Does not apply route as  needed.   Multiple Vitamin (MULTIVITAMIN WITH MINERALS) TABS tablet Take 1 tablet by mouth daily.   predniSONE (DELTASONE) 20 MG tablet Take 2 tablets (40 mg total) by mouth daily with breakfast.   sertraline (ZOLOFT) 25 MG tablet Take 1 tablet (25 mg total) by mouth daily.   triamcinolone (NASACORT) 55 MCG/ACT AERO nasal inhaler Place 2 sprays into the nose daily. (Patient taking differently: Place 2 sprays into the nose daily as needed (congestion).)   Vitamin D, Ergocalciferol, (DRISDOL) 1.25 MG (50000 UNIT) CAPS capsule Take 1 capsule by mouth once week x 10 weeks   No facility-administered encounter medications on file as of 04/10/2021.    Reviewed chart prior to disease state call. Spoke with patient regarding BP  Recent Office Vitals: BP Readings from Last 3 Encounters:  01/04/21 128/88  05/04/20 110/70  04/26/20 (!) 144/88   Pulse Readings from Last 3 Encounters:  01/04/21 71  05/04/20 77  04/26/20 74    Wt Readings from Last 3 Encounters:  01/04/21 146 lb 9.6 oz (66.5 kg)  05/04/20 141 lb (64 kg)  04/23/20 144 lb 10 oz (65.6 kg)     Kidney Function Lab Results  Component Value Date/Time   CREATININE 1.50 (H) 06/01/2020 09:36 AM   CREATININE 1.81 (H) 05/04/2020 11:39 AM   CREATININE 1.81 (H) 06/03/2016 04:31 PM   CREATININE 1.42 (H) 08/08/2013 08:43 AM   GFR 34.10 (L) 06/01/2020 09:36 AM   GFRNONAA 32 (L) 04/26/2020 02:46  AM   GFRAA 37 (L) 04/26/2020 02:46 AM    BMP Latest Ref Rng & Units 06/01/2020 05/04/2020 04/26/2020  Glucose 70 - 99 mg/dL 61(L) 104(H) 143(H)  BUN 6 - 23 mg/dL 26(H) 29(H) 29(H)  Creatinine 0.40 - 1.20 mg/dL 1.50(H) 1.81(H) 1.59(H)  BUN/Creat Ratio 12 - 28 - - -  Sodium 135 - 145 mEq/L 141 136 139  Potassium 3.5 - 5.1 mEq/L 4.1 4.7 4.2  Chloride 96 - 112 mEq/L 105 100 106  CO2 19 - 32 mEq/L 26 28 24   Calcium 8.4 - 10.5 mg/dL 9.6 9.6 9.7   Several unsuccessful attempts to contact patient for hypertension call  Current antihypertensive  regimen:  Losartan 50 mg - take one tab daily  Metoprolol tartrate 25 mg - take one tab twice daily   How often are you checking your Blood Pressure?   Current home BP readings:   What recent interventions/DTPs have been made by any provider to improve Blood Pressure control since last CPP Visit:   Any recent hospitalizations or ED visits since last visit with CPP?   What diet changes have been made to improve Blood Pressure Control?   What exercise is being done to improve your Blood Pressure Control?   Adherence Review: Is the patient currently on ACE/ARB medication? Yes Does the patient have >5 day gap between last estimated fill dates? Yes Metoprolol tartrate 25 mg - 30 DS last filled 06/24/20  Star Rating Drugs: Losartan 50 mg- 90 DS last filled 04/26/20  Atorvastatin 40 mg- 90 DS last filled 01/31/21  [facility] CPA, CMA

## 2021-04-23 ENCOUNTER — Other Ambulatory Visit: Payer: Self-pay

## 2021-04-23 DIAGNOSIS — I10 Essential (primary) hypertension: Secondary | ICD-10-CM

## 2021-04-23 DIAGNOSIS — N183 Chronic kidney disease, stage 3 unspecified: Secondary | ICD-10-CM

## 2021-04-23 MED ORDER — LOSARTAN POTASSIUM 50 MG PO TABS: 50.0000 mg | ORAL_TABLET | Freq: Every day | ORAL | 0 refills | Status: DC

## 2021-04-23 MED ORDER — METOPROLOL TARTRATE 25 MG PO TABS: 25.0000 mg | ORAL_TABLET | Freq: Two times a day (BID) | ORAL | 2 refills | Status: DC

## 2021-05-07 ENCOUNTER — Inpatient Hospital Stay: Admission: RE | Admit: 2021-05-07 | Payer: Medicare HMO | Source: Ambulatory Visit

## 2021-05-14 ENCOUNTER — Ambulatory Visit (INDEPENDENT_AMBULATORY_CARE_PROVIDER_SITE_OTHER)
Admission: RE | Admit: 2021-05-14 | Discharge: 2021-05-14 | Disposition: A | Discharge status: Home / Self Care | Payer: Medicare HMO | Source: Ambulatory Visit | Attending: Acute Care | Admitting: Acute Care

## 2021-05-14 ENCOUNTER — Other Ambulatory Visit: Payer: Self-pay

## 2021-05-14 DIAGNOSIS — Z87891 Personal history of nicotine dependence: Secondary | ICD-10-CM | POA: Diagnosis not present

## 2021-05-14 DIAGNOSIS — F17210 Nicotine dependence, cigarettes, uncomplicated: Secondary | ICD-10-CM

## 2021-05-15 ENCOUNTER — Encounter (HOSPITAL_BASED_OUTPATIENT_CLINIC_OR_DEPARTMENT_OTHER): Payer: Self-pay | Admitting: Family

## 2021-05-15 ENCOUNTER — Ambulatory Visit (HOSPITAL_BASED_OUTPATIENT_CLINIC_OR_DEPARTMENT_OTHER): Payer: Medicare HMO

## 2021-05-15 ENCOUNTER — Ambulatory Visit (INDEPENDENT_AMBULATORY_CARE_PROVIDER_SITE_OTHER): Payer: Medicare HMO | Admitting: Family

## 2021-05-15 ENCOUNTER — Other Ambulatory Visit (HOSPITAL_BASED_OUTPATIENT_CLINIC_OR_DEPARTMENT_OTHER): Payer: Self-pay | Admitting: Family

## 2021-05-15 VITALS — BP 160/102 | HR 67 | Ht 61.0 in | Wt 153.0 lb

## 2021-05-15 DIAGNOSIS — I10 Essential (primary) hypertension: Secondary | ICD-10-CM

## 2021-05-15 DIAGNOSIS — R55 Syncope and collapse: Secondary | ICD-10-CM

## 2021-05-15 DIAGNOSIS — Z9889 Other specified postprocedural states: Secondary | ICD-10-CM

## 2021-05-15 DIAGNOSIS — E782 Mixed hyperlipidemia: Secondary | ICD-10-CM

## 2021-05-15 MED ORDER — LOSARTAN POTASSIUM 50 MG PO TABS: 75.0000 mg | ORAL_TABLET | Freq: Every day | ORAL | 2 refills | Status: DC

## 2021-05-15 MED ORDER — FUROSEMIDE 20 MG PO TABS
20.0000 mg | ORAL_TABLET | Freq: Every day | ORAL | 2 refills | Status: DC
Start: 2021-05-15 — End: 2021-07-24

## 2021-05-15 NOTE — Progress Notes (Signed)
Office Visit    Patient Name: Katie [name] Date of Encounter: 05/15/2021  PCP:  No primary care provider on file.   [facility]  Cardiologist:  Skeet Latch, MD  Advanced Practice Provider:  No care team member to display Electrophysiologist:  None    Chief Complaint    Katie [name] is a 75 y.o. female with a hx of MVP and severe MR s/p minimally invasive MVrepair 2020, nonobstructive CAD, HFpEF, carotid artery disease, HTN, HLD, COPD, renal cell carcinoma s/p right nephrectomy 2011, CKDIII, tobacco use presents today for syncope   Past Medical History    Past Medical History:  Diagnosis Date   AAA (abdominal aortic aneurysm) ([place])    Arthritis    Cholelithiasis 2011   s/p cholescystectomy    Chronic diastolic congestive heart failure ([place])    COPD (chronic obstructive pulmonary disease) ([place])    Emphysema    GERD (gastroesophageal reflux disease)    History of heartburn    History of renal cell carcinoma    Hypercholesteremia    Hypertension    Hypertension    Mitral regurgitation    Pneumonia 1980's   Renal cell carcinoma 01/2010   s/p right radical nephrectomy 01/2010,  followed by [facility]   S/P minimally invasive mitral valve repair 04/20/2019   Complex valvuloplasty including triangular resection of flail segment of posterior leaflet, artificial Gore-tex neochord placement x6 and 30 mm Sorin Memo 4D ring annuloplasty via right mini thoracotomy approach   Shortness of breath    Tuberculosis    Tests positive for PPD. dad had h/o TB.   Past Surgical History:  Procedure Laterality Date   APPENDECTOMY  1970's   BUBBLE STUDY  03/18/2019   Procedure: BUBBLE STUDY;  Surgeon: Fay Records, MD;  Location: [place];  Service: Cardiovascular;;   CARDIAC CATHETERIZATION  09/2011   CHOLECYSTECTOMY  01/2010   DIAGNOSTIC LAPAROSCOPY     KIDNEY SURGERY     LEFT AND RIGHT HEART CATHETERIZATION WITH CORONARY ANGIOGRAM N/A  09/30/2011   Procedure: LEFT AND RIGHT HEART CATHETERIZATION WITH CORONARY ANGIOGRAM;  Surgeon: Candee Furbish, MD;  Location: [facility] CATH LAB;  Service: Cardiovascular;  Laterality: N/A;   MITRAL VALVE REPAIR Right 04/20/2019   Procedure: MINIMALLY INVASIVE MITRAL VALVE REPAIR (MVR) USING MEMO 4D SIZE 30;  Surgeon: Rexene Alberts, MD;  Location: [place];  Service: Open Heart Surgery;  Laterality: Right;   MULTIPLE EXTRACTIONS WITH ALVEOLOPLASTY  10/06/2011   Procedure: MULTIPLE EXTRACION WITH ALVEOLOPLASTY;  Surgeon: Lenn Cal, DDS;  Location: [place];  Service: Oral Surgery;  Laterality: N/A;  Multiple extraction of tooth #'s 1, 6, 8, 10, 11, 22, 23, 26, 27, 28, 29 with alveoloplasty and Upper right buccal exostoses reductions.   NEPHRECTOMY RADICAL  01/2010   right    OVARIAN CYST REMOVAL  1970's   "went through belly button"   RIGHT/LEFT HEART CATH AND CORONARY ANGIOGRAPHY N/A 03/22/2019   Procedure: RIGHT/LEFT HEART CATH AND CORONARY ANGIOGRAPHY;  Surgeon: [name], MD;  Location: [place] CV LAB;  Service: Cardiovascular;  Laterality: N/A;   TEE WITHOUT CARDIOVERSION  10/01/2011   Procedure: TRANSESOPHAGEAL ECHOCARDIOGRAM (TEE);  Surgeon: [name];  Location: [place];  Service: Cardiovascular;  Laterality: N/A;   TEE WITHOUT CARDIOVERSION N/A 03/18/2019   Procedure: TRANSESOPHAGEAL ECHOCARDIOGRAM (TEE);  Surgeon: Fay Records, MD;  Location: [place];  Service: Cardiovascular;  Laterality: N/A;   TEE WITHOUT CARDIOVERSION N/A 04/20/2019  Procedure: TRANSESOPHAGEAL ECHOCARDIOGRAM (TEE);  Surgeon: Rexene Alberts, MD;  Location: [place];  Service: Open Heart Surgery;  Laterality: N/A;   TUBAL LIGATION  1970's   US ECHOCARDIOGRAPHY  09/2011    Allergies  Allergies  Allergen Reactions   Ace Inhibitors Other (See Comments)    Pseudoasthma/ renal failure     History of Present Illness    Katie [name] is a 75 y.o. female with a hx of MVP and severe MR s/p  minimally invasive MVrepair 2020, nonobstructive CAD, HFpEF, carotid artery disease, HTN, HLD, COPD, renal cell carcinoma s/p right nephrectomy 2011, CKDIII, tobacco use last seen 05/05/19.  [place] thing she had that morning was a cup of coffee. She wonders if this was contributory to her episode. She lost consciousness and woke up in an ambulance. Her daughter was present and siad she turned a "blue gray color" and he head was drooping. She did not stay at the hospital as it was "overloaded".   05/11/21 labs HS-troponin 7, wbc 10.4, Hb 14.8, K 4.4, Na 135, creatinine 1.55, GFR 35  Presents today for follow up. Notes chest pain intermittently at night when laying down to sleep. No exertional chest pain. She has not taken Lasix for some time - discontinued for unknown reason. Concern for volume overload as contributory. Does note LE edema. BP at home routinely 150s-160s. Notes lightheadedness. She will have to hold onto something when she stands up. She is concerned about weight gain but has no formal exercise routine.   EKGs/Labs/Other Studies Reviewed:   The following studies were reviewed today:  EKG:  EKG is  ordered today.  The ekg ordered today demonstrates SR 67 bpm with no acute ST/T wave changes.   Recent Labs: 06/01/2020: ALT 13; BUN 26; Creatinine, Ser 1.50; Hemoglobin 12.5; Platelets 301.0; Potassium 4.1; Sodium 141  Recent Lipid Panel    Component Value Date/Time   CHOL 307 (H) 10/31/2019 1432   TRIG 342.0 (H) 10/31/2019 1432   HDL 62.60 10/31/2019 1432   CHOLHDL 5 10/31/2019 1432   VLDL 68.4 (H) 10/31/2019 1432   LDLCALC 115 (H) 09/29/2018 0230   LDLDIRECT 151.0 10/31/2019 1432    Home Medications   Current Meds  Medication Sig   acetaminophen (TYLENOL) 500 MG tablet Take 1,000 mg by mouth every 6 (six) hours as needed for headache (pain).   albuterol (PROVENTIL) (2.5 MG/3ML) 0.083% nebulizer solution Take 3 mLs (2.5  mg total) by nebulization every 6 (six) hours as needed for wheezing or shortness of breath.   albuterol (VENTOLIN HFA) 108 (90 Base) MCG/ACT inhaler Inhale 2 puffs into the lungs every 6 (six) hours as needed for wheezing or shortness of breath.   aspirin EC 81 MG tablet Take 1 tablet (81 mg total) by mouth daily.   atorvastatin (LIPITOR) 40 MG tablet Take 1 tablet (40 mg total) by mouth daily.   benzonatate (TESSALON) 200 MG capsule Take 1 capsule (200 mg total) by mouth 3 (three) times daily as needed.   cyclobenzaprine (FLEXERIL) 10 MG tablet Take 1 tablet (10 mg total) by mouth 3 (three) times daily as needed for muscle spasms.   Ergocalciferol (VITAMIN D2) 50 MCG (2000 UT) TABS Take 1 capsule by mouth daily.   Fluticasone-Umeclidin-Vilant (TRELEGY ELLIPTA) 100-62.5-25 MCG/INH AEPB Inhale 1 puff into the lungs daily. Further refills will need to come from new primary provider   losartan (COZAAR) 50 MG tablet  Take 1 tablet (50 mg total) by mouth daily.   metoprolol tartrate (LOPRESSOR) 25 MG tablet Take 1 tablet (25 mg total) by mouth 2 (two) times daily.   Misc. Devices (PULSE OXIMETER FOR FINGER) MISC 1 Device by Does not apply route as needed.   Multiple Vitamin (MULTIVITAMIN WITH MINERALS) TABS tablet Take 1 tablet by mouth daily.   predniSONE (DELTASONE) 20 MG tablet Take 2 tablets (40 mg total) by mouth daily with breakfast.   sertraline (ZOLOFT) 25 MG tablet Take 1 tablet (25 mg total) by mouth daily.   triamcinolone (NASACORT) 55 MCG/ACT AERO nasal inhaler Place 2 sprays into the nose daily. (Patient taking differently: Place 2 sprays into the nose daily as needed (congestion).)   Vitamin D, Ergocalciferol, (DRISDOL) 1.25 MG (50000 UNIT) CAPS capsule Take 1 capsule by mouth once week x 10 weeks     Review of Systems      All other systems reviewed and are otherwise negative except as noted above.  Physical Exam    VS:  BP (!) 160/102   Pulse 67   Ht '5\' 1"'$  (1.549 m)   Wt 153 lb  (69.4 kg)   SpO2 97%   BMI 28.91 kg/m  , BMI Body mass index is 28.91 kg/m.  Wt Readings from Last 3 Encounters:  05/15/21 153 lb (69.4 kg)  01/04/21 146 lb 9.6 oz (66.5 kg)  05/04/20 141 lb (64 kg)     GEN: Well nourished, well developed, in no acute distress. HEENT: normal. Neck: Supple, no JVD, carotid bruits, or masses. Cardiac: RRR, no murmurs, rubs, or gallops. No clubbing, cyanosis, edema.  Radials/PT 2+ and equal bilaterally.  Respiratory:  Respirations regular and unlabored, right lung clear to auscultation, left upper and lower lobe with inspiratory wheeze GI: Soft, nontender, nondistended. MS: No deformity or atrophy. Skin: Warm and dry, no rash. Neuro:  Strength and sensation are intact. Psych: Normal affect.  Assessment & Plan    Syncope - No noted prodromal symptoms. Concern for arrhythmia. 14 day live telemetry monitor placed today. Echocardiogram ordered. CBC and BMP in ED unremarkable.   MVP and MR s/p MV repair - update echo, as above. Resume Lasix due to volume overload. Unclear why or when it was discontinued. BMP, BNP in 1 week.   Tobacco use - Smoking cessation encouraged. Recommend utilization of 1800QUITNOW.   Postoperative atrial fib - Noted post MV repair. No palpitations. EKG today NSR. Not on long term anticoagulation as isolated episode. If recurrent atrial fib on monitor, will need anticoagulation.   HTN - Not controlled. Home monitoring encouraged. Start Furosemide '20mg'$  daily. Increase Losartan to 75mdaily. If BP remains uncontrolled, consider further up-titration of Losartan vs transition from Metoprolol to carvedilol.   Disposition: Follow up in 5 week(s) with Dr. ROval Linseyor APP.  Signed, CLoel Dubonnet NP 05/15/2021, 2:03 PM [place]

## 2021-05-15 NOTE — Patient Instructions (Addendum)
Medication Instructions:  Your physician has recommended you make the following change in your medication:   CHANGE Losartan to 1.5 tablets ('75mg'$ ) daily  START Furosemide (Lasix) one '20mg'$  daily  *If you need a refill on your cardiac medications before your next appointment, please call your pharmacy*   Lab Work: Your physician recommends that you return for lab work in 1 week for BMP, BNP at [facility]  Testing/Procedures: Your physician has recommended that you wear a Zio monitor.   This monitor is a medical device that records the heart's electrical activity. Doctors most often use these monitors to diagnose arrhythmias. Arrhythmias are problems with the speed or rhythm of the heartbeat. The monitor is a small device applied to your chest. You can wear one while you do your normal daily activities. While wearing this monitor if you have any symptoms to push the button and record what you felt. Once you have worn this monitor for the period of time provider prescribed (Usually 14 days), you will return the monitor device in the postage paid box. Once it is returned they will download the data collected and provide [place] with a report which the provider will then review and we will call you with those results. Important tips:  Avoid showering during the first 24 hours of wearing the monitor. Avoid excessive sweating to help maximize wear time. Do not submerge the device, no hot tubs, and no swimming pools. Keep any lotions or oils away from the patch. After 24 hours you may shower with the patch on. Take brief showers with your back facing the shower head.  Do not remove patch once it has been placed because that will interrupt data and decrease adhesive wear time. Push the button when you have any symptoms and write down what you were feeling. Once you have completed wearing your monitor, remove and place into box which has postage paid and place in your outgoing mailbox.  If for some reason you  have misplaced your box then call our office and we can provide another box and/or mail it off for you.   Your physician has requested that you have an echocardiogram. Echocardiography is a painless test that uses sound waves to create images of your heart. It provides your doctor with information about the size and shape of your heart and how well your heart's chambers and valves are working. This procedure takes approximately one hour. There are no restrictions for this procedure.    Follow-Up: At [facility], you and your health needs are our priority.  As part of our continuing mission to provide you with exceptional heart care, we have created designated Provider Care Teams.  These Care Teams include your primary Cardiologist (physician) and Advanced Practice Providers (APPs -  Physician Assistants and Nurse Practitioners) who all work together to provide you with the care you need, when you need it.  We recommend signing up for the patient portal called "MyChart".  Sign up information is provided on this After Visit Summary.  MyChart is used to connect with patients for Virtual Visits (Telemedicine).  Patients are able to view lab/test results, encounter notes, upcoming appointments, etc.  Non-urgent messages can be sent to your provider as well.   To learn more about what you can do with MyChart, go to [email or web address].    Your next appointment:   5 week(s)  The format for your next appointment:   In Person  Provider:   Skeet Latch, MD   Other Instructions  Tips to Measure your Blood Pressure Correctly  To determine whether you have hypertension, a medical professional will take a blood pressure reading. How you prepare for the test, the position of your arm, and other factors can change a blood pressure reading by 10% or more. That could be enough to hide high blood pressure, start you on a drug you don't really need, or lead your doctor to incorrectly adjust your  medications.  National and international guidelines offer specific instructions for measuring blood pressure. If a doctor, nurse, or medical assistant isn't doing it right, don't hesitate to ask him or her to get with the guidelines.  Here's what you can do to ensure a correct reading:  Don't drink a caffeinated beverage or smoke during the 30 minutes before the test.  Sit quietly for five minutes before the test begins.  During the measurement, sit in a chair with your feet on the floor and your arm supported so your elbow is at about heart level.  The inflatable part of the cuff should completely cover at least 80% of your upper arm, and the cuff should be placed on bare skin, not over a shirt.  Don't talk during the measurement.  Have your blood pressure measured twice, with a brief break in between. If the readings are different by 5 points or more, have it done a third time.  In 2017, new guidelines from the [place], the [employer] of Cardiology, and nine other health organizations lowered the diagnosis of high blood pressure to 130/80 mm Hg or higher for all adults. The guidelines also redefined the various blood pressure categories to now include normal, elevated, Stage 1 hypertension, Stage 2 hypertension, and hypertensive crisis (see "Blood pressure categories").  Blood pressure categories  Blood pressure category SYSTOLIC (upper number)  DIASTOLIC (lower number)  Normal Less than 120 mm Hg and Less than 80 mm Hg  Elevated 120-129 mm Hg and Less than 80 mm Hg  High blood pressure: Stage 1 hypertension 130-139 mm Hg or 80-89 mm Hg  High blood pressure: Stage 2 hypertension 140 mm Hg or higher or 90 mm Hg or higher  Hypertensive crisis (consult your doctor immediately) Higher than 180 mm Hg and/or Higher than 120 mm Hg  Source: American Heart Association and American Stroke Association. For more on getting your blood pressure under control, buy Controlling Your  Blood Pressure, a Special Health Report from [facility].   Blood Pressure Log   Date   Time  Blood Pressure  Position  Example: Nov 1 9 AM 124/78 sitting                                                     Heart Healthy Diet Recommendations: A low-salt diet is recommended. Meats should be grilled, baked, or boiled. Avoid fried foods. Focus on lean protein sources like fish or chicken with vegetables and fruits. The American Heart Association is a Microbiologist!  American Heart Association Diet and Lifeystyle Recommendations    Exercise recommendations: The American Heart Association recommends 150 minutes of moderate intensity exercise weekly. Try 30 minutes of moderate intensity exercise 4-5 times per week. This could include walking, jogging, or swimming.

## 2021-05-16 ENCOUNTER — Telehealth: Payer: Self-pay | Admitting: Acute Care

## 2021-05-16 NOTE — Progress Notes (Signed)
I have called the patient with the results of her scan. I explained that there is a nodule that is a bit larger than we like to see. I reviewed her scan with Dr. Valeta Harms and he recommends a 3 month follow up. Additionally there was notation of an abdominal aortic aneurysm, which I will notify her PCP about for vascular surgery follow up. She also had an enlarged pulmonic trunk on CT. She does have shortness of breath. We will refer her to Dr. Silas Flood. Katie [name], please fax results to PCP. Order 3 month follow up Low Dose CT, Also place referral to Dr. Silas Flood for evaluation of Enlarged pulmonic trunk on CT imaging.  I will call the PCP and have them follow up on the aortic aneurysm. It is the  [facility] where she gets her care. Katie [name]. Phone number is [phone], and fax is [phone].  I have called the PCP office. I told them we will fax results to them . Thanks

## 2021-05-16 NOTE — Telephone Encounter (Signed)
Received call report from [facility] with [facility] Radiology on patient's Low dose CT done on 05/14/21. Sarah, please review the result/impression copied below:  Narrative & Impression  CLINICAL DATA:  75 year old female current smoker with 53 pack-year history of smoking. Lung cancer screening examination.   EXAM: CT CHEST WITHOUT CONTRAST LOW-DOSE FOR LUNG CANCER SCREENING   TECHNIQUE: Multidetector CT imaging of the chest was performed following the standard protocol without IV contrast.   COMPARISON:  Chest CT 04/24/2020. Low-dose lung cancer screening chest CT 05/24/2018.   FINDINGS: Cardiovascular: Heart size is normal. There is no significant pericardial fluid, thickening or pericardial calcification. There is aortic atherosclerosis, as well as atherosclerosis of the great vessels of the mediastinum and the coronary arteries, including calcified atherosclerotic plaque in the left anterior descending and right coronary arteries. Status post mitral annuloplasty. Dilatation of the pulmonic trunk (3.7 cm in diameter), concerning for pulmonary arterial hypertension.   Mediastinum/Nodes: No pathologically enlarged mediastinal or hilar lymph nodes. Please note that accurate exclusion of hilar adenopathy is limited on noncontrast CT scans. Esophagus is unremarkable in appearance. No axillary lymphadenopathy.   Lungs/Pleura: There are some new nodular architectural distortion in the periphery of the right lung at the junction of the major and minor fissures (axial image 112 of series 3), with a volume derived mean diameter of 11.3 mm. No acute consolidative airspace disease. No pleural effusions. Diffuse bronchial wall thickening with mild centrilobular and paraseptal emphysema. Calcified pleural plaque in the base of the right hemithorax, similar to the prior study.   Upper Abdomen: 3.5 x 3.2 cm low-attenuation lesion in segment 8 of the liver, and smaller lesions elsewhere in the right  lobe of the liver incompletely characterized on today's non-contrast CT examination, but statistically likely to represent cysts. Status post cholecystectomy. Status post right nephrectomy. Enlargement of previously noted abdominal aortic aneurysm which is at the level of the renal arteries and measures approximately 4.6 x 4.5 cm on today's study (previously 3.9 x 4.0 cm on 04/24/2020).   Musculoskeletal: There are no aggressive appearing lytic or blastic lesions noted in the visualized portions of the skeleton.   IMPRESSION: 1. Lung-RADS 4BS, suspicious. Additional imaging evaluation or consultation with Pulmonology or Thoracic Surgery recommended. While this may simply represent an area of of all vein scarring, neoplasm is not excluded. 2. The "S" modifier above refers to potentially clinically significant non lung cancer related findings. Specifically, there is aortic atherosclerosis, in addition to 2 vessel coronary artery disease. As well, there is an enlarging abdominal aortic aneurysm at the level of the renal arteries measuring 4.6 x 4.5 cm on today's study (previously 3.9 x 4.0 cm on 04/24/2020). Recommend follow-up CT/MR every 6 months and vascular consultation. This recommendation follows ACR consensus guidelines: White Paper of the ACR Incidental Findings Committee II on Vascular Findings. J Am Coll Radiol 2013; 10:789-794. 3. Dilatation of the pulmonic trunk (3.7 cm in diameter), concerning for pulmonary arterial hypertension. 4. Mild diffuse bronchial wall thickening with mild centrilobular and paraseptal emphysema; imaging findings suggestive of underlying COPD.   These results will be called to the ordering clinician or representative by the Radiologist Assistant, and communication documented in the PACS or [employer].   Aortic Atherosclerosis (ICD10-I70.0) and Emphysema (ICD10-J43.9).     Electronically Signed   By: Vinnie Langton M.D.   On:  05/16/2021 07:35

## 2021-05-16 NOTE — Progress Notes (Signed)
I called her PCP. They know to look for a FAX.

## 2021-05-17 ENCOUNTER — Telehealth: Payer: Self-pay | Admitting: Family

## 2021-05-17 NOTE — Telephone Encounter (Signed)
Attempted to call patient. Unable to leave voicemail.  

## 2021-05-17 NOTE — Telephone Encounter (Signed)
    Pt said when she got her heart monitor, she was not given a box and return label to send back the heart monitor. She wanted to know where she needs to send the heart monitor when she's done

## 2021-05-20 ENCOUNTER — Other Ambulatory Visit: Payer: Self-pay | Admitting: *Deleted

## 2021-05-20 DIAGNOSIS — I272 Pulmonary hypertension, unspecified: Secondary | ICD-10-CM

## 2021-05-20 DIAGNOSIS — Z87891 Personal history of nicotine dependence: Secondary | ICD-10-CM

## 2021-05-20 DIAGNOSIS — F17210 Nicotine dependence, cigarettes, uncomplicated: Secondary | ICD-10-CM

## 2021-05-20 DIAGNOSIS — R911 Solitary pulmonary nodule: Secondary | ICD-10-CM

## 2021-05-20 NOTE — Telephone Encounter (Signed)
After review of message Appears monitor was done at [place] location and spoke with pt and if not an inconvenience may drop off at office on said date Per pt will drop off on August 10 ./cy

## 2021-06-07 ENCOUNTER — Other Ambulatory Visit: Payer: Self-pay

## 2021-06-07 ENCOUNTER — Ambulatory Visit (HOSPITAL_COMMUNITY): Payer: Medicare HMO | Attending: Internal Medicine

## 2021-06-07 DIAGNOSIS — I10 Essential (primary) hypertension: Secondary | ICD-10-CM | POA: Insufficient documentation

## 2021-06-07 DIAGNOSIS — Z954 Presence of other heart-valve replacement: Secondary | ICD-10-CM

## 2021-06-07 DIAGNOSIS — R55 Syncope and collapse: Secondary | ICD-10-CM | POA: Insufficient documentation

## 2021-06-07 DIAGNOSIS — Z9889 Other specified postprocedural states: Secondary | ICD-10-CM | POA: Diagnosis not present

## 2021-06-07 LAB — ECHOCARDIOGRAM COMPLETE
Area-P 1/2: 1.78 cm2
Calc EF: 51.9 %
MV VTI: 2.36 cm2
S' Lateral: 2.5 cm
Single Plane A2C EF: 51.2 %
Single Plane A4C EF: 52.8 %

## 2021-06-08 LAB — BRAIN NATRIURETIC PEPTIDE: BNP: 79.4 pg/mL (ref 0.0–100.0)

## 2021-06-08 LAB — BASIC METABOLIC PANEL
BUN/Creatinine Ratio: 14 (ref 12–28)
BUN: 21 mg/dL (ref 8–27)
CO2: 20 mmol/L (ref 20–29)
Calcium: 9.4 mg/dL (ref 8.7–10.3)
Chloride: 105 mmol/L (ref 96–106)
Creatinine, Ser: 1.46 mg/dL — ABNORMAL HIGH (ref 0.57–1.00)
Glucose: 105 mg/dL — ABNORMAL HIGH (ref 65–99)
Potassium: 4.4 mmol/L (ref 3.5–5.2)
Sodium: 140 mmol/L (ref 134–144)
eGFR: 38 mL/min/{1.73_m2} — ABNORMAL LOW (ref >59)

## 2021-06-11 ENCOUNTER — Telehealth: Payer: Self-pay | Admitting: *Deleted

## 2021-06-11 NOTE — Telephone Encounter (Signed)
Left message to call back about results. Also information release to [employer]

## 2021-06-11 NOTE — Telephone Encounter (Signed)
-----   Message from Loel Dubonnet, NP sent at 06/10/2021 10:48 AM EDT ----- Stable kidney function. Normal electrolytes. BNP with no significant volume overload. Good result! Continue current medications.

## 2021-06-11 NOTE — Telephone Encounter (Signed)
-----   Message from Loel Dubonnet, NP sent at 06/10/2021 10:49 AM EDT ----- Echocardiogram with normal heart pumping function.  Some change in the heart muscle movement and may need to consider stress test but will discuss at follow-up.  Mitral valve with mild leakiness which is overall stable compared to previous.  Follow-up as scheduled 06/21/2021

## 2021-06-12 ENCOUNTER — Ambulatory Visit: Payer: Medicare HMO | Admitting: Vascular Surgery

## 2021-06-12 ENCOUNTER — Other Ambulatory Visit: Payer: Self-pay | Admitting: *Deleted

## 2021-06-12 ENCOUNTER — Encounter: Payer: Self-pay | Admitting: Vascular Surgery

## 2021-06-12 ENCOUNTER — Ambulatory Visit (HOSPITAL_COMMUNITY)
Admission: RE | Admit: 2021-06-12 | Discharge: 2021-06-12 | Disposition: A | Discharge status: Home / Self Care | Payer: Medicare HMO | Source: Ambulatory Visit | Attending: Vascular Surgery | Admitting: Vascular Surgery

## 2021-06-12 ENCOUNTER — Other Ambulatory Visit: Payer: Self-pay

## 2021-06-12 VITALS — BP 139/92 | HR 71 | Temp 97.9°F | Resp 20 | Ht 61.0 in | Wt 150.0 lb

## 2021-06-12 DIAGNOSIS — I714 Abdominal aortic aneurysm, without rupture, unspecified: Secondary | ICD-10-CM

## 2021-06-12 NOTE — Telephone Encounter (Signed)
The patient has been notified of the result and verbalized understanding.  All questions (if any) were answered. Nuala Alpha, LPN [id number] AM

## 2021-06-12 NOTE — Telephone Encounter (Signed)
Patient returning call to go over test results.

## 2021-06-12 NOTE — Progress Notes (Signed)
ASSESSMENT & PLAN   ABDOMINAL AORTIC ANEURYSM: This patient has a 4.6 cm infrarenal abdominal aortic aneurysm.  This is based on the CT of the chest and also a duplex of the abdominal aorta today. I have explained that we would normally consider elective repair of an aneurysm at 5.5 cm.  Based on her previous CT scan she is probably not a candidate for a standard endovascular repair.  If not she would require either open repair or a fenestrated graft.  Both would be associated with significant risk given her multiple medical comorbidities including CKD 3, chronic diastolic congestive heart failure, atrial fibrillation, coronary artery disease, history of renal cell CA, and status post previous minimally invasive mitral valve replacement.  I have explained that the 2 things that increase the risk of aneurysm expansion and rupture are continued tobacco use and poorly controlled blood pressure.  I have explained that the reason we would not consider repair sooner than 5.5 cm is that there is significant risk associated with repair and the risk of rupture is smaller at this point.  The risk of rupture for angina this size is less than 5 %/year.  I have ordered a follow-up duplex of the abdominal aortic aneurysm in 6 months and I will see her back at that time.  If this enlarge ideally we would need a CT angiogram of the chest abdomen and pelvis to evaluate her for possible fenestrated graft versus open abdominal repair.  This is complicated by her chronic kidney disease.  This may have to be done with limited contrast.  REASON FOR CONSULT:    Abdominal aortic aneurysm.  The consult is requested by Arthur Holms, NP  HPI:   Katie [name] is a 75 y.o. female who was referred with an abdominal aortic aneurysm.  The patient had a previous CT of the abdomen in June 2020 which showed that the aneurysm was 3.8 cm in maximum diameter.  The patient had a chest CT for cancer screening and an incidental finding  was that the aneurysm had increased in size to 4.6 cm.  This was a CT of the chest and the entire abdominal aorta was not completely evaluated.  This reason the patient was sent for vascular consultation.  I have reviewed the records from the referring office.  The patient was seen on 05/14/2021.  The patient is followed with multiple medical issues including hypertension, coronary artery disease, congestive heart failure, A. fib, hyperlipidemia, COPD, and a history of cancer of the kidney status post nephrectomy.  The patient has stage III chronic kidney disease.  On my history the patient has not had any significant abdominal pain or back pain.  She has no family history of aneurysmal disease.  She has multiple medical issues as noted above including CKD 3.  She is had a previous right nephrectomy for cancer.  She is had a mini mitral valve.  Risk factors for peripheral vascular disease include hypertension, hypercholesterolemia, and continued tobacco use.  She smokes 15 cigarettes a day.  She denies any history of diabetes, or family history of premature cardiovascular disease.  Past Medical History:  Diagnosis Date   AAA (abdominal aortic aneurysm) ([place])    Arthritis    Cholelithiasis 2011   s/p cholescystectomy    Chronic diastolic congestive heart failure ([facility])    COPD (chronic obstructive pulmonary disease) ([facility])    Emphysema    GERD (gastroesophageal reflux disease)    History of heartburn    History  of renal cell carcinoma    Hypercholesteremia    Hypertension    Hypertension    Mitral regurgitation    Pneumonia 1980's   Renal cell carcinoma 01/2010   s/p right radical nephrectomy 01/2010,  follow[facility]   S/P minimally invasive mitral valve repair 04/20/2019   Complex valvuloplasty including triangular resection of flail segment of posterior leaflet, artificial Gore-tex neochord placement x6 and 30 mm Sorin Memo 4D ring annuloplasty via right mini thoracotomy  approach   Shortness of breath    Tuberculosis    Tests positive for PPD. dad had h/o TB.    Family History  Problem Relation Age of Onset   COPD Mother        DECEASED/SMOKED   Diabetes Brother    Diabetes Brother     SOCIAL HISTORY: Social History   Tobacco Use   Smoking status: Some Days    Packs/day: 1.00    Years: 53.00    Pack years: 53.00    Types: Cigarettes    Last attempt to quit: 03/18/2019    Years since quitting: 2.2   Smokeless tobacco: Never  Substance Use Topics   Alcohol use: Yes    Alcohol/week: 0.0 standard drinks    Comment:  "drink very rarely"    Allergies  Allergen Reactions   Ace Inhibitors Other (See Comments)    Pseudoasthma/ renal failure     Current Outpatient Medications  Medication Sig Dispense Refill   acetaminophen (TYLENOL) 500 MG tablet Take 1,000 mg by mouth every 6 (six) hours as needed for headache (pain).     albuterol (PROVENTIL) (2.5 MG/3ML) 0.083% nebulizer solution Take 3 mLs (2.5 mg total) by nebulization every 6 (six) hours as needed for wheezing or shortness of breath. 150 mL 3   albuterol (VENTOLIN HFA) 108 (90 Base) MCG/ACT inhaler Inhale 2 puffs into the lungs every 6 (six) hours as needed for wheezing or shortness of breath. 8 g 5   aspirin EC 81 MG tablet Take 1 tablet (81 mg total) by mouth daily. 30 tablet 0   atorvastatin (LIPITOR) 40 MG tablet Take 1 tablet (40 mg total) by mouth daily. 90 tablet 1   cyclobenzaprine (FLEXERIL) 10 MG tablet Take 1 tablet (10 mg total) by mouth 3 (three) times daily as needed for muscle spasms. 30 tablet 0   Fluticasone-Umeclidin-Vilant (TRELEGY ELLIPTA) 100-62.5-25 MCG/INH AEPB Inhale 1 puff into the lungs daily. Further refills will need to come from new primary provider 28 each 3   furosemide (LASIX) 20 MG tablet Take 1 tablet (20 mg total) by mouth daily. 30 tablet 2   losartan (COZAAR) 50 MG tablet Take 1.5 tablets (75 mg total) by mouth daily. 45 tablet 2   metoprolol tartrate  (LOPRESSOR) 25 MG tablet Take 1 tablet (25 mg total) by mouth 2 (two) times daily. 60 tablet 2   Misc. Devices (PULSE OXIMETER FOR FINGER) MISC 1 Device by Does not apply route as needed. 1 each 0   Multiple Vitamin (MULTIVITAMIN WITH MINERALS) TABS tablet Take 1 tablet by mouth daily.     sertraline (ZOLOFT) 25 MG tablet Take 1 tablet (25 mg total) by mouth daily. 30 tablet 1   triamcinolone (NASACORT) 55 MCG/ACT AERO nasal inhaler Place 2 sprays into the nose daily. (Patient taking differently: Place 2 sprays into the nose daily as needed (congestion).) 1 Inhaler 0   Vitamin D, Ergocalciferol, (DRISDOL) 1.25 MG (50000 UNIT) CAPS capsule Take 1 capsule by mouth once  week x 10 weeks 10 capsule 0   benzonatate (TESSALON) 200 MG capsule Take 1 capsule (200 mg total) by mouth 3 (three) times daily as needed. (Patient not taking: Reported on 06/12/2021) 60 capsule 0   Ergocalciferol (VITAMIN D2) 50 MCG (2000 UT) TABS Take 1 capsule by mouth daily. (Patient not taking: No sig reported) 90 tablet 11   No current facility-administered medications for this visit.    REVIEW OF SYSTEMS:  '[X]'$  denotes positive finding, '[ ]'$  denotes negative finding Cardiac  Comments:  Chest pain or chest pressure:    Shortness of breath upon exertion: x   Short of breath when lying flat: x   Irregular heart rhythm:        Vascular    Pain in calf, thigh, or hip brought on by ambulation:    Pain in feet at night that wakes you up from your sleep:     Blood clot in your veins:    Leg swelling:         Pulmonary    Oxygen at home:    Productive cough:  x   Wheezing:  x       Neurologic    Sudden weakness in arms or legs:     Sudden numbness in arms or legs:     Sudden onset of difficulty speaking or slurred speech:    Temporary loss of vision in one eye:     Problems with dizziness:         Gastrointestinal    Blood in stool:     Vomited blood:         Genitourinary    Burning when urinating:     Blood in  urine:        Psychiatric    Major depression:         Hematologic    Bleeding problems:    Problems with blood clotting too easily:        Skin    Rashes or ulcers:        Constitutional    Fever or chills:    -  PHYSICAL EXAM:   Vitals:   06/12/21 0819  BP: (!) 139/92  Pulse: 71  Resp: 20  Temp: 97.9 F (36.6 C)  SpO2: 96%  Weight: 150 lb (68 kg)  Height: '5\' 1"'$  (1.549 m)   Body mass index is 28.34 kg/m. GENERAL: The patient is a well-nourished female, in no acute distress. The vital signs are documented above. CARDIAC: There is a regular rate and rhythm.  VASCULAR: I do not detect carotid bruits. She has palpable femoral pulses and palpable dorsalis pedis pulses bilaterally. She has no significant lower extremity swelling. PULMONARY: There is good air exchange bilaterally without wheezing or rales. ABDOMEN: Soft and non-tender with normal pitched bowel sounds.  It is difficult to palpate her aneurysm.  It is nontender however. MUSCULOSKELETAL: There are no major deformities. NEUROLOGIC: No focal weakness or paresthesias are detected. SKIN: There are no ulcers or rashes noted. PSYCHIATRIC: The patient has a normal affect.  DATA:    CT CHEST: I have reviewed the images of the CT of the chest that was done on 05/14/2021.  This was done for lung cancer screening.  Radiology noted " Lung-RADS 4BS, suspicious. Additional imaging evaluation or consultation with Pulmonology or Thoracic Surgery recommended. While this may simply represent an area of of all vein scarring, neoplasm is not excluded.  An incidental finding was that the abdominal aortic aneurysm  now measured 4.6 cm in maximum diameter.  Of note, I did review the CT scan of the abdomen and pelvis that was done over 2 years ago on 03/29/2019.  At that time the aneurysm was 3.8 cm in maximum diameter.  The patient had a conical neck  LABS: I reviewed the labs from 06/07/2021.  Creatinine was 1.46.  GFR was 38.  DUPLEX  ABDOMINAL AORTA: Given that the CT scan did not fully evaluate the infrarenal aorta I did obtain a duplex of her abdominal aorta today.  I have independently interpreted this study.  This shows that the maximum diameter of the aneurysm is 4.6 cm.   Deitra [facility]

## 2021-06-13 ENCOUNTER — Other Ambulatory Visit: Payer: Self-pay

## 2021-06-13 DIAGNOSIS — I714 Abdominal aortic aneurysm, without rupture, unspecified: Secondary | ICD-10-CM

## 2021-06-20 NOTE — Progress Notes (Deleted)
Office Visit    Patient Name: Katie [name] Date of Encounter: 06/20/2021  PCP:  Arthur Holms, NP   [place]  Cardiologist:  Skeet Latch, MD  Advanced Practice Provider:  No care team member to display Electrophysiologist:  None    Chief Complaint    Katie [name] is a 75 y.o. female with a hx of MVP and severe MR s/p minimally invasive MVrepair 2020, nonobstructive CAD, HFpEF, carotid artery disease, HTN, HLD, COPD, renal cell carcinoma s/p right nephrectomy 2011, CKDIII, tobacco use presents today for syncope   Past Medical History    Past Medical History:  Diagnosis Date   AAA (abdominal aortic aneurysm) ([place])    Arthritis    Cholelithiasis 2011   s/p cholescystectomy    Chronic diastolic congestive heart failure ([place])    COPD (chronic obstructive pulmonary disease) ([place])    Emphysema    GERD (gastroesophageal reflux disease)    History of heartburn    History of renal cell carcinoma    Hypercholesteremia    Hypertension    Hypertension    Mitral regurgitation    Pneumonia 1980's   Renal cell carcinoma 01/2010   s/p right radical nephrectomy 01/2010,  [facility]   S/P minimally invasive mitral valve repair 04/20/2019   Complex valvuloplasty including triangular resection of flail segment of posterior leaflet, artificial Gore-tex neochord placement x6 and 30 mm Sorin Memo 4D ring annuloplasty via right mini thoracotomy approach   Shortness of breath    Tuberculosis    Tests positive for PPD. dad had h/o TB.   Past Surgical History:  Procedure Laterality Date   APPENDECTOMY  1970's   BUBBLE STUDY  03/18/2019   Procedure: BUBBLE STUDY;  Surgeon: Fay Records, MD;  Location: [place];  Service: Cardiovascular;;   CARDIAC CATHETERIZATION  09/2011   CHOLECYSTECTOMY  01/2010   DIAGNOSTIC LAPAROSCOPY     KIDNEY SURGERY     LEFT AND RIGHT HEART CATHETERIZATION WITH CORONARY ANGIOGRAM N/A 09/30/2011   Procedure:  LEFT AND RIGHT HEART CATHETERIZATION WITH CORONARY ANGIOGRAM;  Surgeon: Candee Furbish, MD;  Location: [facility] CATH LAB;  Service: Cardiovascular;  Laterality: N/A;   MITRAL VALVE REPAIR Right 04/20/2019   Procedure: MINIMALLY INVASIVE MITRAL VALVE REPAIR (MVR) USING MEMO 4D SIZE 30;  Surgeon: Rexene Alberts, MD;  Location: [place];  Service: Open Heart Surgery;  Laterality: Right;   MULTIPLE EXTRACTIONS WITH ALVEOLOPLASTY  10/06/2011   Procedure: MULTIPLE EXTRACION WITH ALVEOLOPLASTY;  Surgeon: Lenn Cal, DDS;  Location: [place];  Service: Oral Surgery;  Laterality: N/A;  Multiple extraction of tooth #'s 1, 6, 8, 10, 11, 22, 23, 26, 27, 28, 29 with alveoloplasty and Upper right buccal exostoses reductions.   NEPHRECTOMY RADICAL  01/2010   right    OVARIAN CYST REMOVAL  1970's   "went through belly button"   RIGHT/LEFT HEART CATH AND CORONARY ANGIOGRAPHY N/A 03/22/2019   Procedure: RIGHT/LEFT HEART CATH AND CORONARY ANGIOGRAPHY;  Surgeon: [name], MD;  Location: [place] CV LAB;  Service: Cardiovascular;  Laterality: N/A;   TEE WITHOUT CARDIOVERSION  10/01/2011   Procedure: TRANSESOPHAGEAL ECHOCARDIOGRAM (TEE);  Surgeon: [name];  Location: [place];  Service: Cardiovascular;  Laterality: N/A;   TEE WITHOUT CARDIOVERSION N/A 03/18/2019   Procedure: TRANSESOPHAGEAL ECHOCARDIOGRAM (TEE);  Surgeon: Fay Records, MD;  Location: [place];  Service: Cardiovascular;  Laterality: N/A;   TEE WITHOUT CARDIOVERSION N/A 04/20/2019   Procedure: TRANSESOPHAGEAL  ECHOCARDIOGRAM (TEE);  Surgeon: Rexene Alberts, MD;  Location: [place];  Service: Open Heart Surgery;  Laterality: N/A;   TUBAL LIGATION  1970's   US ECHOCARDIOGRAPHY  09/2011    Allergies  Allergies  Allergen Reactions   Ace Inhibitors Other (See Comments)    Pseudoasthma/ renal failure     History of Present Illness    Katie [name] is a 75 y.o. female with a hx of MVP and severe MR s/p minimally invasive  MVrepair 2020, nonobstructive CAD, HFpEF, carotid artery disease, HTN, HLD, COPD, renal cell carcinoma s/p right nephrectomy 2011, CKDIII, tobacco use last seen 05/05/19.  [place] she had that morning was a cup of coffee. She wonders if this was contributory to her episode. She lost consciousness and woke up in an ambulance. Her daughter was present and siad she turned a "blue gray color" and he head was drooping. She did not stay at the hospital as it was "overloaded".   05/11/21 labs HS-troponin 7, wbc 10.4, Hb 14.8, K 4.4, Na 135, creatinine 1.55, GFR 35  Seen 05/15/21 in follow up. Noted intermittent chest pain when laying down and LE edema - not taking Lasix fur unknown reason. This was resumed. Losartan uptitrated due to hypertension. Echo and ZIO ordered due to syncope.   Preliminary ZIO report with predominantly NSR average rate 73 bpm, 2 runs VT with longest 5 beats, 10 SVT runs with fastest 5 beats 171 bpm and longest 12.4 sec avg rate 109 bpm. Episodes of SVT could be possible atrial tachycardia. No patient triggered events.   Echo 06/07/21 LVEF 50-55%, moderate hypokinesis of left ventricle basal-mid inferior wall, RVSF mildly reduced, trivial MR (mean gradient 3.65mhg, 374mprosthetic annuloplasty ring).  She presents today for follow up. ***  EKGs/Labs/Other Studies Reviewed:   The following studies were reviewed today:  ZIO monitor 06/05/21 Patch Wear Time:  13 days and 17 hours (2022-07-27T14:43:05-399 to 2022-08-10T08:17:43-0400)   Patient had a min HR of 46 bpm, max HR of 267 bpm, and avg HR of 73 bpm. Predominant underlying rhythm was Sinus Rhythm. 2 Ventricular Tachycardia runs occurred, the run with the fastest interval lasting 5 beats with a max rate of 267 bpm, the longest  lasting 5 beats with an avg rate of 204 bpm. 10 Supraventricular Tachycardia runs occurred, the run with the fastest interval lasting 5 beats  with a max rate of 171 bpm, the longest lasting 12.4 secs with an avg rate of 109 bpm. Some episodes of  Supraventricular Tachycardia may be possible Atrial Tachycardia with variable block. Isolated SVEs were rare (<1.0%), SVE Couplets were rare (<1.0%), and SVE Triplets were rare (<1.0%). Isolated VEs were rare (<1.0%), VE Couplets were rare (<1.0%), and  no VE Triplets were present. Ventricular Bigeminy was present.  Echo 06/07/21  1. Left ventricular ejection fraction, by estimation, is 50 to 55%. The  left ventricle has low normal function. The left ventricle has no regional  wall motion abnormalities. Left ventricular diastolic function could not  be evaluated. There is moderate  hypokinesis of the left ventricular, basal-mid inferior wall.   2. Right ventricular systolic function is mildly reduced. The right  ventricular size is normal.   3. The mitral valve has been repaired/replaced. Trivial mitral valve  regurgitation. No evidence of mitral stenosis. The mean mitral valve  gradient is 3.0 mmHg with average heart rate of 61 bpm. There is a 30  mm  prosthetic annuloplasty ring present in  the mitral position. Procedure Date: 04/20/2019.   4. The aortic valve is tricuspid. Aortic valve regurgitation is not  visualized.   5. The inferior vena cava is normal in size with greater than 50%  respiratory variability, suggesting right atrial pressure of 3 mmHg.   Comparison(s): Changes from prior study are noted. 04/24/2020: LVEF 60-65%,  mean MV gradient 10 mmHg, mild MR.   EKG:  No EKG is  ordered today.  The ekg independently reviewed form 05/15/21 demonstrated SR 67 bpm with no acute ST/T wave changes.   Recent Labs: 06/07/2021: BNP 79.4; BUN 21; Creatinine, Ser 1.46; Potassium 4.4; Sodium 140  Recent Lipid Panel    Component Value Date/Time   CHOL 307 (H) 10/31/2019 1432   TRIG 342.0 (H) 10/31/2019 1432   HDL 62.60 10/31/2019 1432   CHOLHDL 5 10/31/2019 1432   VLDL 68.4 (H) 10/31/2019  1432   LDLCALC 115 (H) 09/29/2018 0230   LDLDIRECT 151.0 10/31/2019 1432    Home Medications   No outpatient medications have been marked as taking for the 06/21/21 encounter (Appointment) with Loel Dubonnet, NP.     Review of Systems      All other systems reviewed and are otherwise negative except as noted above.  Physical Exam    VS:  There were no vitals taken for this visit. , BMI There is no height or weight on file to calculate BMI.  Wt Readings from Last 3 Encounters:  06/12/21 150 lb (68 kg)  05/15/21 153 lb (69.4 kg)  01/04/21 146 lb 9.6 oz (66.5 kg)   ***  GEN: Well nourished, well developed, in no acute distress. HEENT: normal. Neck: Supple, no JVD, carotid bruits, or masses. Cardiac: RRR, no murmurs, rubs, or gallops. No clubbing, cyanosis, edema.  Radials/PT 2+ and equal bilaterally.  Respiratory:  Respirations regular and unlabored, right lung clear to auscultation, left upper and lower lobe with inspiratory wheeze GI: Soft, nontender, nondistended. MS: No deformity or atrophy. Skin: Warm and dry, no rash. Neuro:  Strength and sensation are intact. Psych: Normal affect.  Assessment & Plan    Syncope - No noted prodromal symptoms. Monitor 04/2021 predominanlty NSR, 2 runs VT (longest 5 beats), 10 runs of SVT. No significant pause. Echo 05/2021 with no significant valvular abnormality. ***  MVP and MR s/p MV repair - Echo 06/07/21 trivial MR, mean MV gradient 70mHg, 365mprosthetic annuloplasty ring. ***  Tobacco use - Smoking cessation encouraged. Recommend utilization of [id number].   Postoperative atrial fib - Noted post MV repair. No palpitations. Not on long term anticoagulation as isolated episode. Monitor 06/05/21 with no atrial fib.  HTN - ***  AAA - Follows with vascular surgery.    Disposition: Follow up in 5 week(s) *** with Dr. RaOval Linseyr APP.  Signed, CaLoel DubonnetNP 06/20/2021, 9:01 PM [place]

## 2021-06-21 ENCOUNTER — Encounter: Payer: Self-pay | Admitting: *Deleted

## 2021-06-21 ENCOUNTER — Ambulatory Visit (HOSPITAL_BASED_OUTPATIENT_CLINIC_OR_DEPARTMENT_OTHER): Payer: Medicare HMO | Admitting: Family

## 2021-06-28 ENCOUNTER — Institutional Professional Consult (permissible substitution): Payer: Medicare HMO | Admitting: Pulmonary Disease

## 2021-07-10 ENCOUNTER — Other Ambulatory Visit: Payer: Self-pay | Admitting: Registered Nurse

## 2021-07-10 DIAGNOSIS — E2839 Other primary ovarian failure: Secondary | ICD-10-CM

## 2021-07-23 ENCOUNTER — Encounter (HOSPITAL_COMMUNITY): Payer: Self-pay | Admitting: Emergency Medicine

## 2021-07-23 ENCOUNTER — Emergency Department (HOSPITAL_COMMUNITY): Payer: Medicare HMO

## 2021-07-23 ENCOUNTER — Observation Stay (HOSPITAL_COMMUNITY)
Admission: EM | Admit: 2021-07-23 | Discharge: 2021-07-24 | Disposition: A | Discharge status: Home / Self Care | Payer: Medicare HMO | Attending: Internal Medicine | Admitting: Internal Medicine

## 2021-07-23 ENCOUNTER — Other Ambulatory Visit: Payer: Self-pay

## 2021-07-23 DIAGNOSIS — N183 Chronic kidney disease, stage 3 unspecified: Secondary | ICD-10-CM | POA: Diagnosis not present

## 2021-07-23 DIAGNOSIS — Z20822 Contact with and (suspected) exposure to covid-19: Secondary | ICD-10-CM | POA: Diagnosis not present

## 2021-07-23 DIAGNOSIS — R1013 Epigastric pain: Secondary | ICD-10-CM | POA: Diagnosis present

## 2021-07-23 DIAGNOSIS — R9431 Abnormal electrocardiogram [ECG] [EKG]: Secondary | ICD-10-CM | POA: Diagnosis present

## 2021-07-23 DIAGNOSIS — I5032 Chronic diastolic (congestive) heart failure: Secondary | ICD-10-CM | POA: Diagnosis present

## 2021-07-23 DIAGNOSIS — R112 Nausea with vomiting, unspecified: Secondary | ICD-10-CM | POA: Diagnosis not present

## 2021-07-23 DIAGNOSIS — I5033 Acute on chronic diastolic (congestive) heart failure: Secondary | ICD-10-CM | POA: Diagnosis present

## 2021-07-23 DIAGNOSIS — J449 Chronic obstructive pulmonary disease, unspecified: Secondary | ICD-10-CM | POA: Diagnosis not present

## 2021-07-23 DIAGNOSIS — R1084 Generalized abdominal pain: Secondary | ICD-10-CM | POA: Diagnosis present

## 2021-07-23 DIAGNOSIS — Z7982 Long term (current) use of aspirin: Secondary | ICD-10-CM | POA: Diagnosis not present

## 2021-07-23 DIAGNOSIS — F17210 Nicotine dependence, cigarettes, uncomplicated: Secondary | ICD-10-CM | POA: Insufficient documentation

## 2021-07-23 DIAGNOSIS — N1831 Chronic kidney disease, stage 3a: Secondary | ICD-10-CM | POA: Diagnosis present

## 2021-07-23 DIAGNOSIS — I130 Hypertensive heart and chronic kidney disease with heart failure and stage 1 through stage 4 chronic kidney disease, or unspecified chronic kidney disease: Secondary | ICD-10-CM | POA: Insufficient documentation

## 2021-07-23 DIAGNOSIS — R0789 Other chest pain: Secondary | ICD-10-CM | POA: Diagnosis not present

## 2021-07-23 DIAGNOSIS — Z79899 Other long term (current) drug therapy: Secondary | ICD-10-CM | POA: Diagnosis not present

## 2021-07-23 DIAGNOSIS — F418 Other specified anxiety disorders: Secondary | ICD-10-CM | POA: Diagnosis present

## 2021-07-23 DIAGNOSIS — E785 Hyperlipidemia, unspecified: Secondary | ICD-10-CM | POA: Diagnosis present

## 2021-07-23 DIAGNOSIS — I10 Essential (primary) hypertension: Secondary | ICD-10-CM | POA: Diagnosis present

## 2021-07-23 DIAGNOSIS — R197 Diarrhea, unspecified: Secondary | ICD-10-CM | POA: Insufficient documentation

## 2021-07-23 DIAGNOSIS — I714 Abdominal aortic aneurysm, without rupture, unspecified: Secondary | ICD-10-CM | POA: Diagnosis present

## 2021-07-23 DIAGNOSIS — Z9889 Other specified postprocedural states: Secondary | ICD-10-CM

## 2021-07-23 DIAGNOSIS — Z85528 Personal history of other malignant neoplasm of kidney: Secondary | ICD-10-CM | POA: Insufficient documentation

## 2021-07-23 LAB — RESP PANEL BY RT-PCR (FLU A&B, COVID) ARPGX2
Influenza A by PCR: NEGATIVE
Influenza B by PCR: NEGATIVE
SARS Coronavirus 2 by RT PCR: NEGATIVE

## 2021-07-23 LAB — COMPREHENSIVE METABOLIC PANEL
ALT: 14 U/L (ref 0–44)
AST: 21 U/L (ref 15–41)
Albumin: 3.9 g/dL (ref 3.5–5.0)
Alkaline Phosphatase: 67 U/L (ref 38–126)
Anion gap: 13 (ref 5–15)
BUN: 15 mg/dL (ref 8–23)
CO2: 18 mmol/L — ABNORMAL LOW (ref 22–32)
Calcium: 9.6 mg/dL (ref 8.9–10.3)
Chloride: 105 mmol/L (ref 98–111)
Creatinine, Ser: 1.39 mg/dL — ABNORMAL HIGH (ref 0.44–1.00)
GFR, Estimated: 40 mL/min — ABNORMAL LOW (ref >60)
Glucose, Bld: 144 mg/dL — ABNORMAL HIGH (ref 70–99)
Potassium: 4.4 mmol/L (ref 3.5–5.1)
Sodium: 136 mmol/L (ref 135–145)
Total Bilirubin: 0.9 mg/dL (ref 0.3–1.2)
Total Protein: 7.8 g/dL (ref 6.5–8.1)

## 2021-07-23 LAB — URINALYSIS, ROUTINE W REFLEX MICROSCOPIC
Bilirubin Urine: NEGATIVE
Glucose, UA: 50 mg/dL — AB
Hgb urine dipstick: NEGATIVE
Ketones, ur: 20 mg/dL — AB
Leukocytes,Ua: NEGATIVE
Nitrite: NEGATIVE
Protein, ur: NEGATIVE mg/dL
Specific Gravity, Urine: 1.02 (ref 1.005–1.030)
pH: 8 (ref 5.0–8.0)

## 2021-07-23 LAB — CBC
HCT: 45.1 % (ref 36.0–46.0)
Hemoglobin: 15 g/dL (ref 12.0–15.0)
MCH: 30.1 pg (ref 26.0–34.0)
MCHC: 33.3 g/dL (ref 30.0–36.0)
MCV: 90.6 fL (ref 80.0–100.0)
Platelets: 277 10*3/uL (ref 150–400)
RBC: 4.98 MIL/uL (ref 3.87–5.11)
RDW: 12.9 % (ref 11.5–15.5)
WBC: 7.5 10*3/uL (ref 4.0–10.5)
nRBC: 0 % (ref 0.0–0.2)

## 2021-07-23 LAB — TROPONIN I (HIGH SENSITIVITY)
Troponin I (High Sensitivity): 5 ng/L (ref <18)
Troponin I (High Sensitivity): 6 ng/L (ref <18)

## 2021-07-23 LAB — TYPE AND SCREEN
ABO/RH(D): A POS
Antibody Screen: NEGATIVE

## 2021-07-23 LAB — I-STAT CHEM 8, ED
BUN: 17 mg/dL (ref 8–23)
Calcium, Ion: 1.08 mmol/L — ABNORMAL LOW (ref 1.15–1.40)
Chloride: 106 mmol/L (ref 98–111)
Creatinine, Ser: 1.3 mg/dL — ABNORMAL HIGH (ref 0.44–1.00)
Glucose, Bld: 154 mg/dL — ABNORMAL HIGH (ref 70–99)
HCT: 42 % (ref 36.0–46.0)
Hemoglobin: 14.3 g/dL (ref 12.0–15.0)
Potassium: 4 mmol/L (ref 3.5–5.1)
Sodium: 139 mmol/L (ref 135–145)
TCO2: 22 mmol/L (ref 22–32)

## 2021-07-23 LAB — LIPASE, BLOOD: Lipase: 35 U/L (ref 11–51)

## 2021-07-23 LAB — MAGNESIUM: Magnesium: 1.9 mg/dL (ref 1.7–2.4)

## 2021-07-23 MED ORDER — ENOXAPARIN SODIUM 40 MG/0.4ML IJ SOSY
40.0000 mg | PREFILLED_SYRINGE | INTRAMUSCULAR | Status: DC
  Administered 2021-07-23: 40 mg via SUBCUTANEOUS
  Filled 2021-07-23: qty 0.4

## 2021-07-23 MED ORDER — LORAZEPAM 2 MG/ML IJ SOLN: 0.5000 mg | Freq: Three times a day (TID) | INTRAMUSCULAR | Status: DC | PRN

## 2021-07-23 MED ORDER — FLUTICASONE-UMECLIDIN-VILANT 200-62.5-25 MCG/INH IN AEPB: 1.0000 | INHALATION_SPRAY | Freq: Every day | RESPIRATORY_TRACT | Status: DC

## 2021-07-23 MED ORDER — MORPHINE SULFATE (PF) 2 MG/ML IV SOLN
2.0000 mg | Freq: Once | INTRAVENOUS | Status: AC
  Administered 2021-07-23: 2 mg via INTRAVENOUS
  Filled 2021-07-23: qty 1

## 2021-07-23 MED ORDER — PROCHLORPERAZINE EDISYLATE 10 MG/2ML IJ SOLN: 5.0000 mg | Freq: Four times a day (QID) | INTRAMUSCULAR | Status: DC | PRN

## 2021-07-23 MED ORDER — MORPHINE SULFATE (PF) 2 MG/ML IV SOLN
2.0000 mg | Freq: Once | INTRAVENOUS | Status: AC
Start: 2021-07-23 — End: 2021-07-23
  Administered 2021-07-23: 2 mg via INTRAVENOUS
  Filled 2021-07-23: qty 1

## 2021-07-23 MED ORDER — ACETAMINOPHEN 325 MG PO TABS
650.0000 mg | ORAL_TABLET | Freq: Four times a day (QID) | ORAL | Status: DC | PRN
  Administered 2021-07-24: 650 mg via ORAL
  Filled 2021-07-23: qty 2

## 2021-07-23 MED ORDER — LABETALOL HCL 5 MG/ML IV SOLN
5.0000 mg | Freq: Once | INTRAVENOUS | Status: AC
  Administered 2021-07-23: 5 mg via INTRAVENOUS
  Filled 2021-07-23: qty 4

## 2021-07-23 MED ORDER — NICOTINE 14 MG/24HR TD PT24
14.0000 mg | MEDICATED_PATCH | TRANSDERMAL | Status: DC
  Administered 2021-07-23: 14 mg via TRANSDERMAL
  Filled 2021-07-23: qty 1

## 2021-07-23 MED ORDER — FLUTICASONE FUROATE-VILANTEROL 200-25 MCG/INH IN AEPB
1.0000 | INHALATION_SPRAY | Freq: Every day | RESPIRATORY_TRACT | Status: DC
  Filled 2021-07-23: qty 28

## 2021-07-23 MED ORDER — IOHEXOL 350 MG/ML SOLN
75.0000 mL | Freq: Once | INTRAVENOUS | Status: AC | PRN
  Administered 2021-07-23: 75 mL via INTRAVENOUS

## 2021-07-23 MED ORDER — MORPHINE SULFATE (PF) 2 MG/ML IV SOLN
2.0000 mg | INTRAVENOUS | Status: DC | PRN
Start: 2021-07-23 — End: 2021-07-23

## 2021-07-23 MED ORDER — UMECLIDINIUM BROMIDE 62.5 MCG/INH IN AEPB
1.0000 | INHALATION_SPRAY | Freq: Every day | RESPIRATORY_TRACT | Status: DC
  Filled 2021-07-23: qty 7

## 2021-07-23 MED ORDER — SODIUM CHLORIDE 0.9 % IV SOLN: INTRAVENOUS | Status: DC

## 2021-07-23 MED ORDER — HYDRALAZINE HCL 20 MG/ML IJ SOLN: 5.0000 mg | Freq: Four times a day (QID) | INTRAMUSCULAR | Status: DC | PRN

## 2021-07-23 MED ORDER — SODIUM CHLORIDE 0.9 % IV SOLN
6.2500 mg | Freq: Four times a day (QID) | INTRAVENOUS | Status: DC | PRN
  Filled 2021-07-23: qty 0.25

## 2021-07-23 MED ORDER — ONDANSETRON HCL 4 MG/2ML IJ SOLN
4.0000 mg | Freq: Once | INTRAMUSCULAR | Status: AC
  Administered 2021-07-23: 4 mg via INTRAVENOUS
  Filled 2021-07-23: qty 2

## 2021-07-23 MED ORDER — MORPHINE SULFATE (PF) 2 MG/ML IV SOLN: 1.0000 mg | INTRAVENOUS | Status: DC | PRN

## 2021-07-23 MED ORDER — ACETAMINOPHEN 650 MG RE SUPP: 650.0000 mg | Freq: Four times a day (QID) | RECTAL | Status: DC | PRN

## 2021-07-23 MED ORDER — PANTOPRAZOLE SODIUM 40 MG IV SOLR
40.0000 mg | INTRAVENOUS | Status: DC
  Administered 2021-07-23: 40 mg via INTRAVENOUS
  Filled 2021-07-23: qty 40

## 2021-07-23 MED ORDER — SODIUM CHLORIDE 0.9 % IV SOLN: Freq: Once | INTRAVENOUS | Status: AC

## 2021-07-23 MED ORDER — ALBUTEROL SULFATE HFA 108 (90 BASE) MCG/ACT IN AERS: 2.0000 | INHALATION_SPRAY | Freq: Four times a day (QID) | RESPIRATORY_TRACT | Status: DC | PRN

## 2021-07-23 MED ORDER — SODIUM CHLORIDE 0.9 % IV BOLUS
1000.0000 mL | Freq: Once | INTRAVENOUS | Status: AC
  Administered 2021-07-23: 1000 mL via INTRAVENOUS

## 2021-07-23 NOTE — ED Notes (Signed)
Got patient undressed on the monitor patient is resting with call bell in reach 

## 2021-07-23 NOTE — H&P (Addendum)
History and Physical    Katie [name] TDD:[id number] DOB: May 16, 1946 DOA: 07/23/2021  PCP: Arthur Holms, NP Consultants:  cardiology: Dr. Oval Linsey, nephrology: Dr. Posey Pronto,  vascular surgery: Dr. Scot Dock Patient coming from:  Home - lives with her son   Chief Complaint: intractable N/V and abdominal pain   HPI: Katie [name] is a 75 y.o. female with medical history significant of HTN, HLD, COPD, diastolic CHF, GERD, hx of MVP and severe MR s/p minimallyh invasive MV repair in 2020, nonobstructive CAD, renal cell carcinoma s/p right nephrectomy 2011,  tobacco abuse who presented to ED with 2 day history of abdominal pain mainly in mid abdomen, nausea and vomiting. Pain described as dull and comes and goes. Pain rated as a 10/10. Does not radiate anywhere. Having a BM makes it feel better. She isn't eating so unsure if food makes it worse. She has had about 6 episodes of vomiting and numerous episodes of diarrhea yesterday. This has gotten better today. She denies any blood in her stool or vomit. She has been unable to keep anything down over the past 2 days and  has not taken any of her home medication.   She was started on zoloft yesterday for anxiety and depression. Symptoms started after taking this medication.    She denies any recent sick contacts, travel, cruise. She denies eating any raw fish or meat, rice. No other household member has similar symptoms.   No fever, but has had sweats and chills, no shortness of breath or cough from her baseline, chest pain or palpitations, leg swelling, dyuria, headaches, vision changes.   Has had cholecystectomy   ED Course: vitals: Temperature 97.2, blood pressure 192/124, heart rate 77, respiratory rate 18, oxygen 100% on room air. Pertinent labs: CO2 18, creatinine 1.39, lipase within normal limits, troponin x2 within normal limits, UA with 20 ketones.  Covid/flu negative. Chest x-ray within normal limits.  CT angio chest abdomen: Negative for aortic  dissection but positive for progressed chronic juxtarenal abdominal aortic aneurysm, now 4.7 cm diameter..  Recommend follow-up every 6 months and vascular consultation.  Given fluids, Zofran, morphine and TRH was asked to admit.   Review of Systems: As per HPI; otherwise review of systems reviewed and negative.   Ambulatory Status:  Ambulates without assistance   Past Medical History:  Diagnosis Date   AAA (abdominal aortic aneurysm)    Arthritis    Cholelithiasis 2011   s/p cholescystectomy    Chronic diastolic congestive heart failure (HCC)    COPD (chronic obstructive pulmonary disease) (HCC)    Emphysema    GERD (gastroesophageal reflux disease)    History of heartburn    History of renal cell carcinoma    Hypercholesteremia    Hypertension    Hypertension    Mitral regurgitation    Pneumonia 1980's   Renal cell carcinoma 01/2010   s/p right radical nephrectomy 01/2010,  followed by [facility]   S/P minimally invasive mitral valve repair 04/20/2019   Complex valvuloplasty including triangular resection of flail segment of posterior leaflet, artificial Gore-tex neochord placement x6 and 30 mm Sorin Memo 4D ring annuloplasty via right mini thoracotomy approach   Shortness of breath    Tuberculosis    Tests positive for PPD. dad had h/o TB.    Past Surgical History:  Procedure Laterality Date   APPENDECTOMY  1970's   BUBBLE STUDY  03/18/2019   Procedure: BUBBLE STUDY;  Surgeon: Fay Records, MD;  Location: [place];  Service:  Cardiovascular;;   CARDIAC CATHETERIZATION  09/2011   CHOLECYSTECTOMY  01/2010   DIAGNOSTIC LAPAROSCOPY     KIDNEY SURGERY     LEFT AND RIGHT HEART CATHETERIZATION WITH CORONARY ANGIOGRAM N/A 09/30/2011   Procedure: LEFT AND RIGHT HEART CATHETERIZATION WITH CORONARY ANGIOGRAM;  Surgeon: Candee Furbish, MD;  Location: [facility] CATH LAB;  Service: Cardiovascular;  Laterality: N/A;   MITRAL VALVE REPAIR Right 04/20/2019   Procedure: MINIMALLY INVASIVE  MITRAL VALVE REPAIR (MVR) USING MEMO 4D SIZE 30;  Surgeon: Rexene Alberts, MD;  Location: [place];  Service: Open Heart Surgery;  Laterality: Right;   MULTIPLE EXTRACTIONS WITH ALVEOLOPLASTY  10/06/2011   Procedure: MULTIPLE EXTRACION WITH ALVEOLOPLASTY;  Surgeon: Lenn Cal, DDS;  Location: [place];  Service: Oral Surgery;  Laterality: N/A;  Multiple extraction of tooth #'s 1, 6, 8, 10, 11, 22, 23, 26, 27, 28, 29 with alveoloplasty and Upper right buccal exostoses reductions.   NEPHRECTOMY RADICAL  01/2010   right    OVARIAN CYST REMOVAL  1970's   "went through belly button"   RIGHT/LEFT HEART CATH AND CORONARY ANGIOGRAPHY N/A 03/22/2019   Procedure: RIGHT/LEFT HEART CATH AND CORONARY ANGIOGRAPHY;  Surgeon: [name], MD;  Location: [place] [facility] LAB;  Service: Cardiovascular;  Laterality: N/A;   TEE WITHOUT CARDIOVERSION  10/01/2011   Procedure: TRANSESOPHAGEAL ECHOCARDIOGRAM (TEE);  Surgeon: [name];  Location: [place];  Service: Cardiovascular;  Laterality: N/A;   TEE WITHOUT CARDIOVERSION N/A 03/18/2019   Procedure: TRANSESOPHAGEAL ECHOCARDIOGRAM (TEE);  Surgeon: Fay Records, MD;  Location: [place];  Service: Cardiovascular;  Laterality: N/A;   TEE WITHOUT CARDIOVERSION N/A 04/20/2019   Procedure: TRANSESOPHAGEAL ECHOCARDIOGRAM (TEE);  Surgeon: Rexene Alberts, MD;  Location: [place];  Service: Open Heart Surgery;  Laterality: N/A;   TUBAL LIGATION  1970's   US ECHOCARDIOGRAPHY  09/2011    Social History   Socioeconomic History   Marital status: Married    Spouse name: Not on file   Number of children: 3   Years of education: 12   Highest education level: Not on file  Occupational History   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    Patient has 3 grown children that do not live with her.    Patient does not have any medical insurance.   Social Determinants of Health   Financial Resource Strain: Not on file  Food Insecurity: No Food Insecurity   Worried About [place] in the Last Year: Never true   Ran Out of  Food in the Last Year: Never true  Transportation Needs: No Transportation Needs   Lack of Transportation (Medical): No   Lack of Transportation (Non-Medical): No  Physical Activity: Not on file  Stress: Not on file  Social Connections: Not on file  Intimate Partner Violence: Not on file    Allergies  Allergen Reactions   Ace Inhibitors Other (See Comments)    Pseudoasthma/ renal failure     Family History  Problem Relation Age of Onset   COPD Mother        DECEASED/SMOKED   Diabetes Brother    Diabetes Brother     Prior to Admission medications   Medication Sig Start Date End Date Taking? Authorizing Provider  acetaminophen (TYLENOL) 500 MG tablet Take 1,000 mg by mouth every 6 (six) hours as needed for headache (pain).    [provider]  albuterol (PROVENTIL) (2.5 MG/3ML) 0.083% nebulizer solution Take 3 mLs (2.5 mg total) by nebulization every 6 (six) hours as needed for wheezing or shortness of breath. 03/30/19   Brunetta Jeans, PA-C  albuterol (VENTOLIN HFA) 108 (90 Base) MCG/ACT inhaler Inhale 2 puffs into the lungs every 6 (six) hours as needed for wheezing  or shortness of breath. 02/08/20   Brunetta Jeans, PA-C  aspirin EC 81 MG tablet Take 1 tablet (81 mg total) by mouth daily. 09/29/18   Bonnielee Haff, MD  atorvastatin (LIPITOR) 40 MG tablet Take 1 tablet (40 mg total) by mouth daily. 05/30/20   Brunetta Jeans, PA-C  benzonatate (TESSALON) 200 MG capsule Take 1 capsule (200 mg total) by mouth 3 (three) times daily as needed. Patient not taking: Reported on 06/12/2021 01/04/21   Hoyt Koch, MD  cyclobenzaprine (FLEXERIL) 10 MG tablet Take 1 tablet (10 mg total) by mouth 3 (three) times daily as needed for muscle spasms. 01/04/21   Hoyt Koch, MD  Ergocalciferol (VITAMIN D2) 50 MCG (2000 UT) TABS Take 1 capsule by mouth daily. Patient not taking: No sig reported 05/16/20   Brunetta Jeans, PA-C  Fluticasone-Umeclidin-Vilant (TRELEGY ELLIPTA) 100-62.5-25 MCG/INH AEPB Inhale 1 puff into the lungs daily. Further refills will need to come from new primary provider 12/21/20   Dutch Quint B, FNP  furosemide (LASIX) 20 MG tablet Take 1 tablet (20 mg total) by mouth daily. 05/15/21 08/13/21  Loel Dubonnet, NP  losartan (COZAAR) 50 MG tablet Take 1.5 tablets (75 mg total) by mouth daily. 05/15/21 08/13/21  Loel Dubonnet, NP  metoprolol tartrate (LOPRESSOR) 25 MG tablet Take 1 tablet (25 mg total) by mouth 2 (two) times daily. 04/23/21   Kennyth Arnold, FNP  Misc. Devices (PULSE OXIMETER FOR FINGER) MISC 1 Device by Does not apply route as needed. 03/11/19   Brunetta Jeans, PA-C  Multiple Vitamin (MULTIVITAMIN WITH MINERALS) TABS tablet Take 1 tablet by mouth daily.    [provider]  sertraline (ZOLOFT) 25 MG tablet Take 1 tablet (25 mg total) by mouth daily. 05/04/20   Brunetta Jeans, PA-C  triamcinolone (NASACORT) 55 MCG/ACT AERO nasal inhaler Place 2 sprays into the nose daily. Patient taking differently: Place 2 sprays into the nose daily as needed (congestion). 02/08/20   Brunetta Jeans, PA-C  Vitamin D,  Ergocalciferol, (DRISDOL) 1.25 MG (50000 UNIT) CAPS capsule Take 1 capsule by mouth once week x 10 weeks 05/30/20   Brunetta Jeans, [place]    Physical Exam: Vitals:   07/23/21 1100 07/23/21 1115 07/23/21 1200 07/23/21 1215  BP: (!) 182/115 (!) 186/116 (!) 162/108 (!) 168/94  Pulse: 84 78 73 68  Resp: 18 (!) 25 18 17   Temp:      SpO2: 97% 98% 96% 97%  Weight:      Height:         General:  Appears calm and in mild distress when abdominal pain hits Eyes:  PERRL, EOMI, normal lids, iris ENT:  grossly normal hearing, lips & tongue, mmm; appropriate dentition Neck:  no LAD, masses or thyromegaly; no carotid bruits Cardiovascular:  RRR, no m/r/g. No LE edema.  Respiratory:   CTA bilaterally with no wheezes/rales/rhonchi.  Normal respiratory effort. Abdomen:  soft, TTP over epigastric, umbilical, LUQ and mildly in LLQ. No rebound or guarding. BS decreased, but present.  Back:   normal alignment, no CVAT Skin:  no rash or induration seen on limited exam. Skin tenting  Musculoskeletal:  grossly normal tone BUE/BLE, good ROM, no bony abnormality Lower extremity:  No LE edema.  Limited foot exam with no ulcerations.  2+ distal pulses. Psychiatric:  grossly normal mood and affect, speech fluent and appropriate, AOx3 Neurologic:  CN 2-12 grossly intact, moves all extremities in coordinated fashion, sensation intact    Radiological Exams on Admission: Independently reviewed - see discussion in A/P where applicable  DG Chest 2 View  Result Date: 07/23/2021 CLINICAL DATA:  Chest pain, vomiting and diarrhea for 2 days. EXAM: CHEST - 2 VIEW COMPARISON:  04/23/2020 and older studies.  CT, 05/14/2021. FINDINGS: Cardiac silhouette is normal in size. Stable prosthetic mitral valve. No mediastinal or hilar masses. No evidence of adenopathy. Lungs are clear.  No pleural effusion or pneumothorax. Skeletal structures are intact. IMPRESSION: No active cardiopulmonary disease. Electronically Signed   By:  Lajean Manes M.D.   On: 07/23/2021 08:29   CT Angio Chest/Abd/Pel for Dissection W and/or Wo Contrast  Result Date: 07/23/2021 CLINICAL DATA:  75 year old female with chest pain, abdominal pain, and vomiting. EXAM: CT ANGIOGRAPHY CHEST, ABDOMEN AND PELVIS TECHNIQUE: Non-contrast CT of the chest was initially obtained. Multidetector CT imaging through the chest, abdomen and pelvis was performed using the standard protocol during bolus administration of intravenous contrast. Multiplanar reconstructed images and MIPs were obtained and reviewed to evaluate the vascular anatomy. CONTRAST:  52mL OMNIPAQUE IOHEXOL 350 MG/ML SOLN COMPARISON:  Chest radiographs earlier today. Chest CT 05/14/2021. CTA chest, Abdomen, and Pelvis today are reported separately. 03/29/2019. FINDINGS: CTA CHEST FINDINGS Cardiovascular: Calcified coronary artery and thoracic aortic atherosclerosis. Negative for thoracic aortic aneurysm or dissection. Tortuous proximal great vessels appear to be patent. Decreased cardiac size since 2020. Prosthetic aortic valve appears to be new since that time. No pericardial effusion. Mediastinum/Nodes: Negative. Lungs/Pleura: Central airways are patent. There is right lower lobe bronchial wall thickening and partial airway opacification which has progressed since July (series 6, images 83 and 88). Similar but less pronounced findings in the left lower lobe segmental airways (image 90). Chronic pleural calcification along the surface of the right diaphragm. No pleural effusion. No other acute pulmonary opacity. Upper lobe emphysema and apical scarring appears stable since July. Musculoskeletal: No acute osseous abnormality identified. Review of the MIP images confirms the above findings. CTA ABDOMEN AND PELVIS FINDINGS VASCULAR Chronic juxtarenal abdominal aortic aneurysm with bulky mural plaque or thrombus. Aneurysm diameter now up to 4.7 cm on series 5, image 155, and coronal series 8, image 76 previously  38 mm in 2020. No periaortic hematoma. No superimposed abdominal aortic dissection. The aneurysm tapers to the  aortoiliac bifurcation. Celiac, SMA, left renal artery and IMA remain patent. Iliac arteries remain patent with mild tortuosity and atherosclerosis. Review of the MIP images confirms the above findings. NON-VASCULAR Hepatobiliary: Chronically absent gallbladder with stable surgical clips at the gallbladder fossa and along the posterior right lobe of the liver. Chronic benign cyst at the liver dome has been present since 2011. And smaller cysts in the lower right hepatic lobe appear stable since 2020. Pancreas: Negative. Spleen: Negative. Adrenals/Urinary Tract: Chronic right nephrectomy. Left adrenal gland and left kidney are stable. Decompressed left ureter. Diminutive and unremarkable bladder. Stomach/Bowel: Negative, with diminutive or absent appendix. No dilated large or small bowel. Stomach and duodenum are within normal limits, although proximal small bowel loops might be in the right upper quadrant. The duodenum does appear to cross midline on series 5, image 158. No free air or free fluid. Lymphatic: No lymphadenopathy. Reproductive: Negative. Other: No pelvic free fluid. Musculoskeletal: Chronic L3 compression fracture is stable including mild retropulsion. No acute osseous abnormality identified. Review of the MIP images confirms the above findings. IMPRESSION: 1. Negative for aortic dissection but positive for progressed chronic juxtarenal abdominal aortic aneurysm, now 4.7 cm diameter. Bulky associated mural plaque or thrombus. Recommend follow-up CT/MR every 6 months and vascular consultation. This recommendation follows ACR consensus guidelines: White Paper of the ACR Incidental Findings Committee II on Vascular Findings. J Am Coll Radiol 2013; 10:789-794. Aortic Atherosclerosis (ICD10-I70.0) and Aortic aneurysm NOS (ICD10-I71.9). 2. Chronic lung disease with Emphysema (ICD10-J43.9). Lower  lobe airway thickening and/or opacification is new since July and appears inflammatory, but there is no focal pneumonia. No pleural effusion. 3. No other acute or inflammatory process identified in the chest, abdomen, or pelvis. Chronic right nephrectomy, L3 compression fracture, hepatic cysts. Electronically Signed   By: Genevie Ann M.D.   On: 07/23/2021 09:48    EKG: Independently reviewed.  NSR with rate 79 with sinus arrhythmia; nonspecific ST changes with no evidence of acute ischemia. Qt prolongation new, but unsure if accurate in setting of sinus arrhythmia. Repeat ekg.    Labs on Admission: I have personally reviewed the available labs and imaging studies at the time of the admission.  Pertinent labs:  CO2 18-->22  creatinine 1.39, (1.43-1.50) lipase within normal limits, troponin x2 within normal limits,  UA with 20 ketones.    Assessment/Plan Principal Problem:   Intractable nausea and vomiting -75 year old female with 2-day history of acute onset of nausea vomiting diarrhea and inability to maintain p.o. intake with ketonuria -Admit to telemetry -Gentle IV fluid hydration -Antiemetics as needed -Liquid diet advance as tolerated -Suspicious for gastroenteritis vs. AE from high dose zoloft.   Active Problems: Generalized abdominal pain -patient in quite intense pain that seems to come and go -CTA chest/abdo/pelvis shows patent SMA and no acute findings -Will continue with supportive therapy for gastroenteritis with IVF, pain meds. - -She would like to try to eat. Will try liquids/brat diet and advance as tolerated.  -protonix IV -may benefit from bentyl when tolerating PO   Diarrhea -has improved a lot from yesterday ? GI viral gastroenteritis vs. AE from zoloft  -GI stool panel pending, do not restart zoloft  -enteric precautions     QT prolongation -unsure of accuracy with sinus arrhythmia of true prolonged qt Checking magnesium and repeating ekg -will limit  antiemetic if prolonged. Will do short term compazine prn x 24 hours  -avoid other qt prolonging drugs  Metabolic acidosis -likely in setting of dehydration, kidney disease  and hyperchloremic acidosis from diarrhea. Repeat improved to 22 -diarrhea improving and will continue IVF -repeat labs in AM  -concern for SMA, but patent on CTA imaging     AAA (abdominal aortic aneurysm) without rupture -Followed by vascular surgery, Dr. Scot Dock and last seen 05/2021.  -infrarenal abdominal aortic aneurysm was 4.6cm>4.7 on CT today -repeat CT/MR q 6 months with vascular.     COPD -Stable no signs of exacerbation -Home inhalers/SABA as needed    Essential hypertension -Quite hypertensive on arrival likely due to an inability to take p.o. meds as well as intense pain given IV metoprolol with some response -have trended down and are soft.  -will continue IV parameters and hold home metoprolol and cozaar.     CKD (chronic kidney disease) stage 3, GFR 30-59 ml/min (HCC) -At her baseline despite intractable nausea and vomiting and diarrhea -Continue home medication at this time -Take and output and monitor renal function daily    Chronic diastolic congestive heart failure (HCC) -No signs of volume overload and euvolemic if not volume down on exam -Intake and output -Last echo August 2022: Normal EF at 50 to 55%, with left ventricular normal low function.  No wall motion abnormalities.  Moderate hypokinesis of the left ventricular, basal mid inferior wall.  Right ventricular systolic function mildly reduced.  Nonobstructive CAD Continue statin and ASA. SMA patent on imaging     HLD (hyperlipidemia) -Continue home statin    Depression with anxiety -started on 100mg  zoloft yesterday, could be contributing and very high dose for her age.  -would start her on much lower dose and possibly change SSRI agents if this is contributing.  -talking to daughter she does have panic attacks and anxiety, will  write for prn anxiety medication (IV until tolerating PO)     S/P minimally invasive mitral valve repair Stable on echo in August 2022.  Tobacco abuse Nicotine patch Encouraged cessation  Renal cell carcinoma s/p right nephrectomy in 2011 Stable    Body mass index is 28.34 kg/m.   Level of care: Telemetry Medical DVT prophylaxis:  Lovenox Code Status:  DNR- confirmed with patient Family Communication: None present; I spoke with the patient's daughter by telephone at the time of admission. Lalla Brothers, [phone] Disposition Plan:  The patient is from: home  Anticipated d/c is to: home Requires inpatient hospitalization and is at significant risk of worsening, requires constant monitoring, IV fluids and medication and recurrent assessment.  Patient is currently: ill  Consults called: none  Admission status:  observation   Dragon dictation used in completing this note.    Orma Flaming MD [facility]   How to contact the [facility] Attending or Consulting provider [place] or covering provider during after hours [place], for this patient?  Check the care team in [facility] and look for a) attending/consulting [facility] provider listed and b) the [facility] team listed Log into [email or web address] and use [place]'s universal password to access. If you do not have the password, please contact the hospital operator. Locate the [facility] provider you are looking for under [facility] and page to a number that you can be directly reached. If you still have difficulty reaching the provider, please page the [facility] (Director on Call) for the Hospitalists listed on amion for assistance.   07/23/2021, 1:39 PM

## 2021-07-23 NOTE — ED Notes (Signed)
Report given to Martyn Ehrich, RN

## 2021-07-23 NOTE — ED Triage Notes (Signed)
Patient BIB [facility] for evaluation of abdominal pain, nausea, and vomiting that started yesterday. Received 4mg  zofran PTA. History of AAA at 4.6cm but was told no intervention until it was larger than 5cm.

## 2021-07-23 NOTE — ED Provider Notes (Signed)
[facility] EMERGENCY DEPARTMENT Provider Note   CSN: [id number] Arrival date & time: 07/23/21  0750     History Chief Complaint  Patient presents with   Abdominal Pain    Katie [name] is a 75 y.o. female.  75 year old female with prior medical history as detailed below presents for evaluation.  Patient reports onset of nausea, vomiting, and loose watery diarrhea yesterday.  Patient complains of epigastric abdominal discomfort associated with the symptoms.  She denies fever.  She denies bloody emesis or blood in her stool.  She denies melena.  She denies associated chest pain or shortness of breath.  Prior history is pertinent for history of AAA -measuring approximate 4.6 cm.  Patient admits to history of hypertension.  She reports that she has been unable to take her antihypertensives over the last 24 hours secondary to her nausea and vomiting.  The history is provided by the patient.  Abdominal Pain Pain location:  Epigastric Pain quality: aching and cramping   Pain radiates to:  Does not radiate Pain severity:  Mild Onset quality:  Gradual Duration:  1 day Timing:  Constant Progression:  Waxing and waning Chronicity:  New     Past Medical History:  Diagnosis Date   AAA (abdominal aortic aneurysm)    Arthritis    Cholelithiasis 2011   s/p cholescystectomy    Chronic diastolic congestive heart failure (HCC)    COPD (chronic obstructive pulmonary disease) (HCC)    Emphysema    GERD (gastroesophageal reflux disease)    History of heartburn    History of renal cell carcinoma    Hypercholesteremia    Hypertension    Hypertension    Mitral regurgitation    Pneumonia 1980's   Renal cell carcinoma 01/2010   s/p right radical nephrectomy 01/2010,  followed by [facility]   S/P minimally invasive mitral valve repair 04/20/2019   Complex valvuloplasty including triangular resection of flail segment of posterior leaflet, artificial Gore-tex  neochord placement x6 and 30 mm Sorin Memo 4D ring annuloplasty via right mini thoracotomy approach   Shortness of breath    Tuberculosis    Tests positive for PPD. dad had h/o TB.    Patient Active Problem List   Diagnosis Date Noted   AAA (abdominal aortic aneurysm) without rupture 05/04/2020   Acute respiratory failure with hypoxia ([place]) 04/23/2020   Atrial fibrillation ([place]) 04/26/2019   S/P minimally invasive mitral valve repair 04/20/2019   Chronic diastolic congestive heart failure ([place])    Pneumonia 04/04/2019   Smokers' cough ([place]) 03/13/2019   COPD with acute exacerbation ([place]) 03/13/2019   Acute respiratory failure with hypoxia and hypercapnia ([place]) 03/13/2019   COPD exacerbation ([place]) 03/13/2019   Fx metatarsal 03/07/2019   CKD (chronic kidney disease) stage 3, GFR 30-59 ml/min 09/29/2018   Urinary urgency 06/11/2018   Visit for preventive health examination 04/30/2017   Osteoporosis 12/31/2016   Arthralgia of both hands 11/11/2016   Breast cancer screening 11/11/2016   Chest pain 04/17/2016   Hyperglycemia 08/03/2015   Esophageal reflux    Depression with anxiety    Tobacco abuse disorder 06/27/2014   Insomnia 07/15/2013   Severe mitral regurgitation 09/29/2011   COPD 09/26/2011   Essential hypertension 09/26/2011   HLD (hyperlipidemia) 09/26/2011   S/P cholecystectomy 09/26/2011    Past Surgical History:  Procedure Laterality Date   APPENDECTOMY  1970's   BUBBLE STUDY  03/18/2019   Procedure: BUBBLE STUDY;  Surgeon: Fay Records, MD;  Location: [facility];  Service: Cardiovascular;;   CARDIAC CATHETERIZATION  09/2011   CHOLECYSTECTOMY  01/2010   DIAGNOSTIC LAPAROSCOPY     KIDNEY SURGERY     LEFT AND RIGHT HEART CATHETERIZATION WITH CORONARY ANGIOGRAM N/A 09/30/2011   Procedure: LEFT AND RIGHT HEART CATHETERIZATION WITH CORONARY ANGIOGRAM;  Surgeon: Candee Furbish, MD;  Location: [facility] CATH LAB;  Service: Cardiovascular;  Laterality: N/A;   MITRAL VALVE  REPAIR Right 04/20/2019   Procedure: MINIMALLY INVASIVE MITRAL VALVE REPAIR (MVR) USING MEMO 4D SIZE 30;  Surgeon: Rexene Alberts, MD;  Location: [place];  Service: Open Heart Surgery;  Laterality: Right;   MULTIPLE EXTRACTIONS WITH ALVEOLOPLASTY  10/06/2011   Procedure: MULTIPLE EXTRACION WITH ALVEOLOPLASTY;  Surgeon: Lenn Cal, DDS;  Location: [place];  Service: Oral Surgery;  Laterality: N/A;  Multiple extraction of tooth #'s 1, 6, 8, 10, 11, 22, 23, 26, 27, 28, 29 with alveoloplasty and Upper right buccal exostoses reductions.   NEPHRECTOMY RADICAL  01/2010   right    OVARIAN CYST REMOVAL  1970's   "went through belly button"   RIGHT/LEFT HEART CATH AND CORONARY ANGIOGRAPHY N/A 03/22/2019   Procedure: RIGHT/LEFT HEART CATH AND CORONARY ANGIOGRAPHY;  Surgeon: [name], MD;  Location: [facility];  Service: Cardiovascular;  Laterality: N/A;   TEE WITHOUT CARDIOVERSION  10/01/2011   Procedure: TRANSESOPHAGEAL ECHOCARDIOGRAM (TEE);  Surgeon: [name];  Location: [place];  Service: Cardiovascular;  Laterality: N/A;   TEE WITHOUT CARDIOVERSION N/A 03/18/2019   Procedure: TRANSESOPHAGEAL ECHOCARDIOGRAM (TEE);  Surgeon: Fay Records, MD;  Location: [facility];  Service: Cardiovascular;  Laterality: N/A;   TEE WITHOUT CARDIOVERSION N/A 04/20/2019   Procedure: TRANSESOPHAGEAL ECHOCARDIOGRAM (TEE);  Surgeon: Rexene Alberts, MD;  Location: [place];  Service: Open Heart Surgery;  Laterality: N/A;   TUBAL LIGATION  1970's   US ECHOCARDIOGRAPHY  09/2011     OB History   No obstetric history on file.     Family History  Problem Relation Age of Onset   COPD Mother        DECEASED/SMOKED   Diabetes Brother    Diabetes Brother     Social History   Tobacco Use   Smoking status: Some Days    Packs/day: 1.00    Years: 53.00    Pack years: 53.00    Types: Cigarettes    Last attempt to quit: 03/18/2019    Years since quitting: 2.3   Smokeless tobacco: Never   Vaping Use   Vaping Use: Never used  Substance Use Topics   Alcohol use: Yes    Alcohol/week: 0.0 standard drinks    Comment:  "drink very rarely"   Drug use: Not Currently    Types: Cocaine    Comment: hx of cocaine last in 2006    Home Medications Prior to Admission medications   Medication Sig Start Date End Date Taking? Authorizing Provider  acetaminophen (TYLENOL) 500 MG tablet Take 1,000 mg by mouth every 6 (six) hours as needed for headache (pain).    [provider]  albuterol (PROVENTIL) (2.5 MG/3ML) 0.083% nebulizer solution Take 3 mLs (2.5 mg total) by nebulization every 6 (six) hours as needed for wheezing or shortness of breath. 03/30/19   Brunetta Jeans, PA-C  albuterol (VENTOLIN HFA) 108 (90 Base) MCG/ACT inhaler Inhale 2 puffs into the lungs every 6 (six) hours as needed for wheezing or shortness of breath. 02/08/20   Brunetta Jeans, PA-C  aspirin  EC 81 MG tablet Take 1 tablet (81 mg total) by mouth daily. 09/29/18   Bonnielee Haff, MD  atorvastatin (LIPITOR) 40 MG tablet Take 1 tablet (40 mg total) by mouth daily. 05/30/20   Brunetta Jeans, PA-C  benzonatate (TESSALON) 200 MG capsule Take 1 capsule (200 mg total) by mouth 3 (three) times daily as needed. Patient not taking: Reported on 06/12/2021 01/04/21   Hoyt Koch, MD  cyclobenzaprine (FLEXERIL) 10 MG tablet Take 1 tablet (10 mg total) by mouth 3 (three) times daily as needed for muscle spasms. 01/04/21   Hoyt Koch, MD  Ergocalciferol (VITAMIN D2) 50 MCG (2000 UT) TABS Take 1 capsule by mouth daily. Patient not taking: No sig reported 05/16/20   Brunetta Jeans, PA-C  Fluticasone-Umeclidin-Vilant (TRELEGY ELLIPTA) 100-62.5-25 MCG/INH AEPB Inhale 1 puff into the lungs daily. Further refills will need to come from new primary provider 12/21/20   Dutch Quint B, FNP  furosemide (LASIX) 20 MG tablet Take 1 tablet (20 mg total) by mouth daily. 05/15/21 08/13/21  Loel Dubonnet, NP   losartan (COZAAR) 50 MG tablet Take 1.5 tablets (75 mg total) by mouth daily. 05/15/21 08/13/21  Loel Dubonnet, NP  metoprolol tartrate (LOPRESSOR) 25 MG tablet Take 1 tablet (25 mg total) by mouth 2 (two) times daily. 04/23/21   Kennyth Arnold, FNP  Misc. Devices (PULSE OXIMETER FOR FINGER) MISC 1 Device by Does not apply route as needed. 03/11/19   Brunetta Jeans, PA-C  Multiple Vitamin (MULTIVITAMIN WITH MINERALS) TABS tablet Take 1 tablet by mouth daily.    [provider]  sertraline (ZOLOFT) 25 MG tablet Take 1 tablet (25 mg total) by mouth daily. 05/04/20   Brunetta Jeans, PA-C  triamcinolone (NASACORT) 55 MCG/ACT AERO nasal inhaler Place 2 sprays into the nose daily. Patient taking differently: Place 2 sprays into the nose daily as needed (congestion). 02/08/20   Brunetta Jeans, PA-C  Vitamin D, Ergocalciferol, (DRISDOL) 1.25 MG (50000 UNIT) CAPS capsule Take 1 capsule by mouth once week x 10 weeks 05/30/20   Brunetta Jeans, PA-C    Allergies    Ace inhibitors  Review of Systems   Review of Systems  Gastrointestinal:  Positive for abdominal pain.  All other systems reviewed and are negative.  Physical Exam Updated Vital Signs BP (!) 210/115 (BP Location: Right Arm)   Pulse 74   Temp (!) 97.2 F (36.2 C)   Resp 18   SpO2 97%   Physical Exam Vitals and nursing note reviewed.  Constitutional:      General: She is not in acute distress.    Appearance: Normal appearance. She is well-developed.  HENT:     Head: Normocephalic and atraumatic.  Eyes:     Conjunctiva/sclera: Conjunctivae normal.     Pupils: Pupils are equal, round, and reactive to light.  Cardiovascular:     Rate and Rhythm: Normal rate and regular rhythm.     Heart sounds: Normal heart sounds.  Pulmonary:     Effort: Pulmonary effort is normal. No respiratory distress.     Breath sounds: Normal breath sounds.  Abdominal:     General: There is no distension.     Palpations: Abdomen is  soft.     Tenderness: There is abdominal tenderness in the epigastric area.  Musculoskeletal:        General: No deformity. Normal range of motion.     Cervical back: Normal range of motion and neck  supple.  Skin:    General: Skin is warm and dry.  Neurological:     General: No focal deficit present.     Mental Status: She is alert and oriented to person, place, and time.    ED Results / Procedures / Treatments   Labs (all labs ordered are listed, but only abnormal results are displayed) Labs Reviewed  RESP PANEL BY RT-PCR (FLU A&B, COVID) ARPGX2  LIPASE, BLOOD  COMPREHENSIVE METABOLIC PANEL  CBC  URINALYSIS, ROUTINE W REFLEX MICROSCOPIC  I-STAT CHEM 8, ED  TYPE AND SCREEN  TROPONIN I (HIGH SENSITIVITY)    EKG EKG Interpretation  Date/Time:  Tuesday July 23 2021 07:52:34 EDT Ventricular Rate:  79 PR Interval:  144 QRS Duration: 84 QT Interval:  428 QTC Calculation: 490 R Axis:   22 Text Interpretation: Sinus rhythm with marked sinus arrhythmia Minimal voltage criteria for LVH, may be normal variant ( Cornell product ) Prolonged QT Abnormal ECG Confirmed by Dene Gentry [phone]) on 07/23/2021 8:37:24 AM  Radiology DG Chest 2 View  Result Date: 07/23/2021 CLINICAL DATA:  Chest pain, vomiting and diarrhea for 2 days. EXAM: CHEST - 2 VIEW COMPARISON:  04/23/2020 and older studies.  CT, 05/14/2021. FINDINGS: Cardiac silhouette is normal in size. Stable prosthetic mitral valve. No mediastinal or hilar masses. No evidence of adenopathy. Lungs are clear.  No pleural effusion or pneumothorax. Skeletal structures are intact. IMPRESSION: No active cardiopulmonary disease. Electronically Signed   By: Lajean Manes M.D.   On: 07/23/2021 08:29    Procedures Procedures   Medications Ordered in ED Medications  sodium chloride 0.9 % bolus 1,000 mL (has no administration in time range)  morphine 2 MG/ML injection 2 mg (has no administration in time range)  ondansetron (ZOFRAN)  injection 4 mg (has no administration in time range)  labetalol (NORMODYNE) injection 5 mg (has no administration in time range)    ED Course  I have reviewed the triage vital signs and the nursing notes.  Pertinent labs & imaging results that were available during my care of the patient were reviewed by me and considered in my medical decision making (see chart for details).    MDM Rules/Calculators/A&P                           MDM  MSE complete  Katie [name] was evaluated in Emergency Department on 07/23/2021 for the symptoms described in the history of present illness. She was evaluated in the context of the global COVID-19 pandemic, which necessitated consideration that the patient might be at risk for infection with the SARS-CoV-2 virus that causes COVID-19. Institutional protocols and algorithms that pertain to the evaluation of patients at risk for COVID-19 are in a state of rapid change based on information released by regulatory bodies including the CDC and federal and state organizations. These policies and algorithms were followed during the patient's care in the ED.   Patient presented with complaint of nausea, vomiting, and diarrhea.  This was associated with abdominal cramping.  Patient was noted to be hypertensive on arrival.  Patient has not been able to take her p.o. antihypertensives for 24 hours.  Patient with history of AAA.  Imaging obtained is without evidence of significant pathology related to known history of AAA.  Patient is minimally improved with IV fluids, antiemetics, and pain medications.  She would likely benefit from continued treatment in the hospital.  Hospitalist service is aware of case and  will evaluate for admission.  Final Clinical Impression(s) / ED Diagnoses Final diagnoses:  Nausea and vomiting, unspecified vomiting type  Diarrhea, unspecified type    Rx / DC Orders ED Discharge Orders     None        Valarie Merino,  MD 07/23/21 1312

## 2021-07-23 NOTE — ED Notes (Signed)
Called lab to have magnesium added onto previous collection. Per lab they will add on magnesium

## 2021-07-23 NOTE — ED Provider Notes (Signed)
Emergency Medicine Provider Triage Evaluation Note  Katie [name] , a 75 y.o. female  was evaluated in triage.  Pt complains of abdominal pain nausea vomiting onset yesterday, pain is severe constant aching epigastric nonradiating no clear aggravating or alleviating factors.  Denies similar in the past.  Review of Systems  Positive: Abdominal pain, nausea, vomiting Negative: Fever, chills, chest pain, shortness of breath, extremity pain/weakness, extremity swelling  Physical Exam  BP (!) 206/114 (BP Location: Right Arm)   Pulse 78   Temp (!) 97.2 F (36.2 C)   Resp 18   SpO2 100%  Gen:   Awake, no acute distress, uncomfortable. Resp:  Normal effort  MSK:   Moves extremities without difficulty, pulses intact x4 Other:  Abdomen mildly tender  Medical Decision Making  Medically screening exam initiated at 8:12 AM.  Appropriate orders placed.  Ludia Gartland was informed that the remainder of the evaluation will be completed by another provider, this initial triage assessment does not replace that evaluation, and the importance of remaining in the ED until their evaluation is complete.  Brief chart review shows patient with known abdominal aortic aneurysm of 4.6 cm.  Given onset of symptoms nausea vomiting there is concern and we will order a CT angio dissection study to further evaluate.  Pain medication and fluids ordered.  Patient has received Zofran PTA.  Patient noted to be hypertensive 206/114.  I discussed the case with attending physician Dr. Deanna Artis, due to prolonged wait time patient will be in the lobby and we are unable to start patient on an IV drip for blood pressure control.  We will order CT scan ASAP, have added i-STAT Chem-8 for expedited creatinine for scan.  I have asked nursing staff to place patient in next available room.   Note: Portions of this report may have been transcribed using voice recognition software. Every effort was made to ensure accuracy; however,  inadvertent computerized transcription errors may still be present.    Deliah Boston, PA-C 07/23/21 [id number]    Valarie Merino, MD 07/23/21 1314

## 2021-07-23 NOTE — ED Notes (Signed)
Tray ordered.

## 2021-07-24 LAB — BASIC METABOLIC PANEL
Anion gap: 8 (ref 5–15)
BUN: 12 mg/dL (ref 8–23)
CO2: 21 mmol/L — ABNORMAL LOW (ref 22–32)
Calcium: 7.5 mg/dL — ABNORMAL LOW (ref 8.9–10.3)
Chloride: 105 mmol/L (ref 98–111)
Creatinine, Ser: 1.24 mg/dL — ABNORMAL HIGH (ref 0.44–1.00)
GFR, Estimated: 46 mL/min — ABNORMAL LOW (ref >60)
Glucose, Bld: 85 mg/dL (ref 70–99)
Potassium: 3.2 mmol/L — ABNORMAL LOW (ref 3.5–5.1)
Sodium: 134 mmol/L — ABNORMAL LOW (ref 135–145)

## 2021-07-24 LAB — CBC
HCT: 37 % (ref 36.0–46.0)
Hemoglobin: 11.9 g/dL — ABNORMAL LOW (ref 12.0–15.0)
MCH: 29.8 pg (ref 26.0–34.0)
MCHC: 32.2 g/dL (ref 30.0–36.0)
MCV: 92.7 fL (ref 80.0–100.0)
Platelets: 225 10*3/uL (ref 150–400)
RBC: 3.99 MIL/uL (ref 3.87–5.11)
RDW: 13.1 % (ref 11.5–15.5)
WBC: 8.3 10*3/uL (ref 4.0–10.5)
nRBC: 0 % (ref 0.0–0.2)

## 2021-07-24 LAB — LACTIC ACID, PLASMA: Lactic Acid, Venous: 0.9 mmol/L (ref 0.5–1.9)

## 2021-07-24 NOTE — ED Notes (Signed)
Admitting paged to RN per her request 

## 2021-07-24 NOTE — ED Notes (Signed)
Pt tolerated breakfast. Pt was given crackers, milk and ginger ale for snack. Pt tolerated snack.

## 2021-07-24 NOTE — ED Notes (Signed)
Placed Breakfast orders 

## 2021-07-24 NOTE — ED Notes (Signed)
Lunch ordered 

## 2021-07-24 NOTE — ED Notes (Signed)
Pt has complained of no pain or nausea since 1500 on 10/4 and states she gets to go home today b/c she feels much better

## 2021-08-02 ENCOUNTER — Institutional Professional Consult (permissible substitution): Payer: Medicare HMO | Admitting: Pulmonary Disease

## 2021-08-14 ENCOUNTER — Institutional Professional Consult (permissible substitution): Payer: Medicare HMO | Admitting: Pulmonary Disease

## 2021-11-11 ENCOUNTER — Inpatient Hospital Stay: Admission: RE | Admit: 2021-11-11 | Payer: Medicare HMO | Source: Ambulatory Visit

## 2021-11-11 ENCOUNTER — Telehealth: Payer: Self-pay

## 2021-11-11 NOTE — Telephone Encounter (Signed)
Pt called with c/o R groin pain x 1 month. She is due for 6 month AAA f/u with APP next month. Per APP, pt has been advised to contact her PCP and look into getting a pelvic xray, as he has had a remote history of osteoarthritis of the R hip. Pt verbalized understanding.

## 2021-11-14 ENCOUNTER — Encounter (HOSPITAL_COMMUNITY): Payer: Medicare HMO

## 2021-11-15 ENCOUNTER — Encounter (HOSPITAL_COMMUNITY): Payer: Medicare HMO

## 2021-11-18 ENCOUNTER — Ambulatory Visit (INDEPENDENT_AMBULATORY_CARE_PROVIDER_SITE_OTHER)
Admission: RE | Admit: 2021-11-18 | Discharge: 2021-11-18 | Disposition: A | Discharge status: Home / Self Care | Payer: Medicare HMO | Source: Ambulatory Visit | Attending: Acute Care | Admitting: Acute Care

## 2021-11-18 ENCOUNTER — Other Ambulatory Visit: Payer: Self-pay

## 2021-11-18 DIAGNOSIS — R911 Solitary pulmonary nodule: Secondary | ICD-10-CM

## 2021-11-18 DIAGNOSIS — F17210 Nicotine dependence, cigarettes, uncomplicated: Secondary | ICD-10-CM

## 2021-11-18 DIAGNOSIS — Z87891 Personal history of nicotine dependence: Secondary | ICD-10-CM | POA: Diagnosis not present

## 2021-11-21 ENCOUNTER — Other Ambulatory Visit: Payer: Self-pay | Admitting: Acute Care

## 2021-11-21 DIAGNOSIS — F17200 Nicotine dependence, unspecified, uncomplicated: Secondary | ICD-10-CM

## 2021-11-21 DIAGNOSIS — F17210 Nicotine dependence, cigarettes, uncomplicated: Secondary | ICD-10-CM

## 2021-11-21 DIAGNOSIS — Z87891 Personal history of nicotine dependence: Secondary | ICD-10-CM

## 2021-12-11 ENCOUNTER — Telehealth: Payer: Self-pay | Admitting: Acute Care

## 2021-12-11 NOTE — Telephone Encounter (Signed)
Left voicemail and call back number to review LDCT results.

## 2021-12-12 ENCOUNTER — Other Ambulatory Visit (HOSPITAL_COMMUNITY): Payer: Medicare HMO

## 2021-12-12 ENCOUNTER — Encounter: Payer: Self-pay | Admitting: Physician Assistant

## 2021-12-12 ENCOUNTER — Other Ambulatory Visit: Payer: Self-pay

## 2021-12-12 ENCOUNTER — Ambulatory Visit (HOSPITAL_COMMUNITY)
Admission: RE | Admit: 2021-12-12 | Discharge: 2021-12-12 | Disposition: A | Discharge status: Home / Self Care | Payer: Medicare Other | Source: Ambulatory Visit | Attending: Vascular Surgery | Admitting: Vascular Surgery

## 2021-12-12 ENCOUNTER — Ambulatory Visit (INDEPENDENT_AMBULATORY_CARE_PROVIDER_SITE_OTHER): Payer: Medicare Other | Admitting: Physician Assistant

## 2021-12-12 VITALS — BP 131/89 | HR 62 | Temp 97.6°F | Resp 20 | Ht 61.0 in | Wt 145.7 lb

## 2021-12-12 DIAGNOSIS — I714 Abdominal aortic aneurysm, without rupture, unspecified: Secondary | ICD-10-CM

## 2021-12-12 NOTE — Progress Notes (Signed)
[facility] [place] HISTORY AND PHYSICAL   History of Present Illness:  Patient is a 76 y.o. year old female who presents for evaluation of abdominal aortic aneurysm.  She was last seen 11/18/21 with the AAA measuring 4.7 cm on a CT scan.  She is here today for repeat surveillance with AAA duplex.  She denies sever abdominal or lumbar pain.  She does have right hip joint pain.  When she lays down 2 times a week she has central upper abdominal pain that reflects acid reflux and subsides within 5 min.    Dr. Scot Dock is following her and on her last visit he discussed a plan that we would not consider repair sooner than 5.5 cm unless she became symptomatic with sever abd/lumbar pain with impending rupture.  The risk of rupture for angina this size is less than 5 %/year.  The fixation would be based on her previous CT scan she is probably not a candidate for a standard endovascular repair.  If not she would require either open repair or a fenestrated graft per Dr. Nicole Cella note.      She is medically maintained on ASA and Statin daily.    Past Medical History:  Diagnosis Date   AAA (abdominal aortic aneurysm)    Arthritis    Cholelithiasis 2011   s/p cholescystectomy    Chronic diastolic congestive heart failure (HCC)    COPD (chronic obstructive pulmonary disease) (HCC)    Emphysema    GERD (gastroesophageal reflux disease)    History of heartburn    History of renal cell carcinoma    Hypercholesteremia    Hypertension    Hypertension    Mitral regurgitation    Pneumonia 1980's   Renal cell carcinoma 01/2010   s/p right radical nephrectomy 01/2010,  [facility]   S/P minimally invasive mitral valve repair 04/20/2019   Complex valvuloplasty including triangular resection of flail segment of posterior leaflet, artificial Gore-tex neochord placement x6 and 30 mm Sorin Memo 4D ring annuloplasty via right mini thoracotomy approach   Shortness of breath     Tuberculosis    Tests positive for PPD. dad had h/o TB.    Past Surgical History:  Procedure Laterality Date   APPENDECTOMY  1970's   BUBBLE STUDY  03/18/2019   Procedure: BUBBLE STUDY;  Surgeon: Fay Records, MD;  Location: [facility];  Service: Cardiovascular;;   CARDIAC CATHETERIZATION  09/2011   CHOLECYSTECTOMY  01/2010   DIAGNOSTIC LAPAROSCOPY     KIDNEY SURGERY     LEFT AND RIGHT HEART CATHETERIZATION WITH CORONARY ANGIOGRAM N/A 09/30/2011   Procedure: LEFT AND RIGHT HEART CATHETERIZATION WITH CORONARY ANGIOGRAM;  Surgeon: Candee Furbish, MD;  Location: [facility] CATH LAB;  Service: Cardiovascular;  Laterality: N/A;   MITRAL VALVE REPAIR Right 04/20/2019   Procedure: MINIMALLY INVASIVE MITRAL VALVE REPAIR (MVR) USING MEMO 4D SIZE 30;  Surgeon: Rexene Alberts, MD;  Location: [facility];  Service: Open Heart Surgery;  Laterality: Right;   MULTIPLE EXTRACTIONS WITH ALVEOLOPLASTY  10/06/2011   Procedure: MULTIPLE EXTRACION WITH ALVEOLOPLASTY;  Surgeon: Lenn Cal, DDS;  Location: [facility];  Service: Oral Surgery;  Laterality: N/A;  Multiple extraction of tooth #'s 1, 6, 8, 10, 11, 22, 23, 26, 27, 28, 29 with alveoloplasty and Upper right buccal exostoses reductions.   NEPHRECTOMY RADICAL  01/2010   right    OVARIAN CYST REMOVAL  1970's   "went through belly button"   RIGHT/LEFT HEART  CATH AND CORONARY ANGIOGRAPHY N/A 03/22/2019   Procedure: RIGHT/LEFT HEART CATH AND CORONARY ANGIOGRAPHY;  Surgeon: [name], MD;  Location: [facility] CV LAB;  Service: Cardiovascular;  Laterality: N/A;   TEE WITHOUT CARDIOVERSION  10/01/2011   Procedure: TRANSESOPHAGEAL ECHOCARDIOGRAM (TEE);  Surgeon: [name];  Location: [facility];  Service: Cardiovascular;  Laterality: N/A;   TEE WITHOUT CARDIOVERSION N/A 03/18/2019   Procedure: TRANSESOPHAGEAL ECHOCARDIOGRAM (TEE);  Surgeon: Fay Records, MD;  Location: [facility];  Service: Cardiovascular;  Laterality: N/A;   TEE WITHOUT  CARDIOVERSION N/A 04/20/2019   Procedure: TRANSESOPHAGEAL ECHOCARDIOGRAM (TEE);  Surgeon: Rexene Alberts, MD;  Location: [facility];  Service: Open Heart Surgery;  Laterality: N/A;   TUBAL LIGATION  1970's   US ECHOCARDIOGRAPHY  09/2011     Social History Social History   Tobacco Use   Smoking status: Some Days    Packs/day: 1.00    Years: 53.00    Pack years: 53.00    Types: Cigarettes    Last attempt to quit: 03/18/2019    Years since quitting: 2.7    Passive exposure: Current (Son who lives with her Smokes)   Smokeless tobacco: Never  Vaping Use   Vaping Use: Never used  Substance Use Topics   Alcohol use: Yes    Alcohol/week: 0.0 standard drinks    Comment:  "drink very rarely"   Drug use: Not Currently    Types: Cocaine    Comment: hx of cocaine last in 2006    Family History Family History  Problem Relation Age of Onset   COPD Mother        DECEASED/SMOKED   Diabetes Brother    Diabetes Brother     Allergies  Allergies  Allergen Reactions   Ace Inhibitors Other (See Comments)    Pseudoasthma/ renal failure      Current Outpatient Medications  Medication Sig Dispense Refill   acetaminophen (TYLENOL) 500 MG tablet Take 1,000 mg by mouth every 6 (six) hours as needed for headache (pain).     albuterol (PROVENTIL) (2.5 MG/3ML) 0.083% nebulizer solution Take 3 mLs (2.5 mg total) by nebulization every 6 (six) hours as needed for wheezing or shortness of breath. 150 mL 3   albuterol (VENTOLIN HFA) 108 (90 Base) MCG/ACT inhaler Inhale 2 puffs into the lungs every 6 (six) hours as needed for wheezing or shortness of breath. 8 g 5   aspirin EC 81 MG tablet Take 1 tablet (81 mg total) by mouth daily. 30 tablet 0   atorvastatin (LIPITOR) 80 MG tablet Take 80 mg by mouth at bedtime.     Fluticasone-Umeclidin-Vilant (TRELEGY ELLIPTA) 100-62.5-25 MCG/INH AEPB Inhale 1 puff into the lungs daily. Further refills will need to come from new primary provider 28 each 3   losartan  (COZAAR) 50 MG tablet Take 50 mg by mouth daily.     metoprolol tartrate (LOPRESSOR) 25 MG tablet Take 1 tablet (25 mg total) by mouth 2 (two) times daily. 60 tablet 2   Misc. Devices (PULSE OXIMETER FOR FINGER) MISC 1 Device by Does not apply route as needed. 1 each 0   PROLIA 60 MG/ML SOSY injection Inject 60 mg into the skin every 6 (six) months.     TRELEGY ELLIPTA 200-62.5-25 MCG/INH AEPB Inhale 1 puff into the lungs daily.     No current facility-administered medications for this visit.    ROS:   General:  No weight loss, Fever, chills  HEENT: No recent headaches,  no nasal bleeding, no visual changes, no sore throat  Neurologic: No dizziness, blackouts, seizures. No recent symptoms of stroke or mini- stroke. No recent episodes of slurred speech, or temporary blindness.  Cardiac: No recent episodes of chest pain/pressure, no shortness of breath at rest.  No shortness of breath with exertion.  Denies history of atrial fibrillation or irregular heartbeat  Vascular: No history of rest pain in feet.  No history of claudication.  No history of non-healing ulcer, No history of DVT   Pulmonary: No home oxygen, no productive cough, no hemoptysis,  No asthma or wheezing  Musculoskeletal:  [ ]  Arthritis, [ ]  Low back pain,  [ x] Joint pain  Hematologic:No history of hypercoagulable state.  No history of easy bleeding.  No history of anemia  Gastrointestinal: No hematochezia or melena,  No gastroesophageal reflux, no trouble swallowing  Urinary: [ ]  chronic Kidney disease, [ ]  on HD - [ ]  MWF or [ ]  TTHS, [ ]  Burning with urination, [ ]  Frequent urination, [ ]  Difficulty urinating;   Skin: No rashes  Psychological: No history of anxiety,  No history of depression   Physical Examination  Vitals:   12/12/21 0941  BP: 131/89  Pulse: 62  Resp: 20  Temp: 97.6 F (36.4 C)  TempSrc: Temporal  SpO2: 98%  Weight: 145 lb 11.2 oz (66.1 kg)  Height: 5\' 1"  (1.549 m)    Body mass index  is 27.53 kg/m.  General:  Alert and oriented, no acute distress HEENT: Normal Neck: No bruit or JVD Pulmonary: Clear to auscultation bilaterally Cardiac: Regular Rate and Rhythm without murmur Gastrointestinal: Soft, non-tender, non-distended, no mass, no scars Skin: No rash Extremity Pulses:  2+ radial, brachial, femoral, dorsalis pedis, posterior tibial pulses bilaterally Musculoskeletal: No deformity or edema  Neurologic: Upper and lower extremity motor 5/5 and symmetric  DATA:     Abdominal Aorta Findings:  +-----------+-------+----------+----------+--------+--------+--------+   Location    AP (cm) Trans (cm) PSV (cm/s) Waveform Thrombus Comments   +-----------+-------+----------+----------+--------+--------+--------+   Proximal    2.43    2.54       49                                      +-----------+-------+----------+----------+--------+--------+--------+   Mid         4.88    4.79       54                                      +-----------+-------+----------+----------+--------+--------+--------+   Distal      2.53    2.25       39                                      +-----------+-------+----------+----------+--------+--------+--------+   RT CIA Prox 1.2     1.1        108                                     +-----------+-------+----------+----------+--------+--------+--------+   LT CIA Prox 1.1     1.3        59                                      +-----------+-------+----------+----------+--------+--------+--------+  Summary:  Abdominal Aorta: There is evidence of abnormal dilatation of the mid  Abdominal aorta. The largest aortic diameter remains essentially unchanged  compared to prior exam. Previous diameter measurement was 4.4 cm obtained  on 4.88.   ASSESSMENT:  Asymptomatic AAA She recently had a CTA which showed 4.7 cm AAA and the duplex today shows mid AP diameter of 4.88 cm.  She remains asymptomatic.   PLAN: Continue activity as tolerates.  Plan for  smoking cessation.  She may try and start a walking program.  I have her follow up for repeat AAA duplex in 6 months.  If she develops sever abdominal or lumbar pain she will call 911.    Roxy Horseman PA-C [facility] [place] Office: [phone]  MD in clinic [place]

## 2021-12-17 ENCOUNTER — Other Ambulatory Visit: Payer: Self-pay | Admitting: *Deleted

## 2021-12-17 DIAGNOSIS — I714 Abdominal aortic aneurysm, without rupture, unspecified: Secondary | ICD-10-CM

## 2021-12-26 ENCOUNTER — Telehealth: Payer: Self-pay | Admitting: Acute Care

## 2021-12-26 NOTE — Telephone Encounter (Signed)
Letter has been mailed to pt with CT results.  ?

## 2021-12-26 NOTE — Telephone Encounter (Signed)
I have called the patient.There was no answer. I did leave a VM letting the patient know her CT scan was normal from a lung cancer perspective,, and that he will need her annual scan 11/2022. I explained that we will call and schedule her closer to the time. Additionally I told her to follow up with Dr. Nicole Cella office to schedule her 6 month follow up.  ? ?Of note, her abdominal aortic aneurysm at 4.7 cm diameter on 11/18/2021. She saw Dr. Scot Dock 06/12/2022 and he did duplex of the abdominal aortic aneurysm which showed the size to be 4.6 cm. He wanted to see the patient back in 6 months , which would have been 11/2021. I do not see an appointment made in [place].  ?Langley Gauss, please send patient a letter telling her from a lung cancer perspective her scan was fine, 12 month follow up in 11/2022. Also let her know it is time to have her follow up appointment with Dr. Scot Dock the vascular surgeon to have her abdominal aortic aneurysm monitored. Thanks so much ?

## 2022-01-09 ENCOUNTER — Encounter (HOSPITAL_COMMUNITY): Payer: Self-pay

## 2022-01-09 ENCOUNTER — Emergency Department (HOSPITAL_COMMUNITY): Payer: Medicare Other

## 2022-01-09 ENCOUNTER — Inpatient Hospital Stay (HOSPITAL_COMMUNITY)
Admission: EM | Admit: 2022-01-09 | Discharge: 2022-01-11 | DRG: 269 | Disposition: A | Discharge status: Home / Self Care | Payer: Medicare Other | Attending: Family Medicine | Admitting: Family Medicine

## 2022-01-09 ENCOUNTER — Other Ambulatory Visit: Payer: Self-pay

## 2022-01-09 DIAGNOSIS — Z85528 Personal history of other malignant neoplasm of kidney: Secondary | ICD-10-CM

## 2022-01-09 DIAGNOSIS — Z905 Acquired absence of kidney: Secondary | ICD-10-CM

## 2022-01-09 DIAGNOSIS — I110 Hypertensive heart disease with heart failure: Secondary | ICD-10-CM | POA: Diagnosis not present

## 2022-01-09 DIAGNOSIS — I9789 Other postprocedural complications and disorders of the circulatory system, not elsewhere classified: Secondary | ICD-10-CM | POA: Diagnosis not present

## 2022-01-09 DIAGNOSIS — I130 Hypertensive heart and chronic kidney disease with heart failure and stage 1 through stage 4 chronic kidney disease, or unspecified chronic kidney disease: Secondary | ICD-10-CM | POA: Diagnosis present

## 2022-01-09 DIAGNOSIS — I2510 Atherosclerotic heart disease of native coronary artery without angina pectoris: Secondary | ICD-10-CM | POA: Diagnosis present

## 2022-01-09 DIAGNOSIS — R011 Cardiac murmur, unspecified: Secondary | ICD-10-CM

## 2022-01-09 DIAGNOSIS — J439 Emphysema, unspecified: Secondary | ICD-10-CM | POA: Diagnosis present

## 2022-01-09 DIAGNOSIS — Z7951 Long term (current) use of inhaled steroids: Secondary | ICD-10-CM | POA: Diagnosis not present

## 2022-01-09 DIAGNOSIS — K529 Noninfective gastroenteritis and colitis, unspecified: Secondary | ICD-10-CM

## 2022-01-09 DIAGNOSIS — Z91138 Patient's unintentional underdosing of medication regimen for other reason: Secondary | ICD-10-CM

## 2022-01-09 DIAGNOSIS — Z79899 Other long term (current) drug therapy: Secondary | ICD-10-CM | POA: Diagnosis not present

## 2022-01-09 DIAGNOSIS — E7800 Pure hypercholesterolemia, unspecified: Secondary | ICD-10-CM | POA: Diagnosis present

## 2022-01-09 DIAGNOSIS — Z8701 Personal history of pneumonia (recurrent): Secondary | ICD-10-CM

## 2022-01-09 DIAGNOSIS — Z825 Family history of asthma and other chronic lower respiratory diseases: Secondary | ICD-10-CM

## 2022-01-09 DIAGNOSIS — M1990 Unspecified osteoarthritis, unspecified site: Secondary | ICD-10-CM | POA: Diagnosis present

## 2022-01-09 DIAGNOSIS — I714 Abdominal aortic aneurysm, without rupture, unspecified: Secondary | ICD-10-CM | POA: Diagnosis present

## 2022-01-09 DIAGNOSIS — Z9889 Other specified postprocedural states: Secondary | ICD-10-CM | POA: Diagnosis not present

## 2022-01-09 DIAGNOSIS — T465X6A Underdosing of other antihypertensive drugs, initial encounter: Secondary | ICD-10-CM | POA: Diagnosis present

## 2022-01-09 DIAGNOSIS — Z7982 Long term (current) use of aspirin: Secondary | ICD-10-CM | POA: Diagnosis not present

## 2022-01-09 DIAGNOSIS — I10 Essential (primary) hypertension: Secondary | ICD-10-CM | POA: Diagnosis present

## 2022-01-09 DIAGNOSIS — I4891 Unspecified atrial fibrillation: Secondary | ICD-10-CM | POA: Diagnosis not present

## 2022-01-09 DIAGNOSIS — E041 Nontoxic single thyroid nodule: Secondary | ICD-10-CM | POA: Diagnosis present

## 2022-01-09 DIAGNOSIS — J449 Chronic obstructive pulmonary disease, unspecified: Secondary | ICD-10-CM | POA: Diagnosis not present

## 2022-01-09 DIAGNOSIS — F17210 Nicotine dependence, cigarettes, uncomplicated: Secondary | ICD-10-CM | POA: Diagnosis present

## 2022-01-09 DIAGNOSIS — I471 Supraventricular tachycardia: Secondary | ICD-10-CM | POA: Diagnosis not present

## 2022-01-09 DIAGNOSIS — K219 Gastro-esophageal reflux disease without esophagitis: Secondary | ICD-10-CM | POA: Diagnosis present

## 2022-01-09 DIAGNOSIS — M810 Age-related osteoporosis without current pathological fracture: Secondary | ICD-10-CM | POA: Diagnosis present

## 2022-01-09 DIAGNOSIS — T447X6A Underdosing of beta-adrenoreceptor antagonists, initial encounter: Secondary | ICD-10-CM | POA: Diagnosis present

## 2022-01-09 DIAGNOSIS — R1013 Epigastric pain: Secondary | ICD-10-CM | POA: Diagnosis present

## 2022-01-09 DIAGNOSIS — N179 Acute kidney failure, unspecified: Secondary | ICD-10-CM | POA: Diagnosis present

## 2022-01-09 DIAGNOSIS — Z833 Family history of diabetes mellitus: Secondary | ICD-10-CM

## 2022-01-09 DIAGNOSIS — K7689 Other specified diseases of liver: Secondary | ICD-10-CM | POA: Diagnosis present

## 2022-01-09 DIAGNOSIS — A084 Viral intestinal infection, unspecified: Secondary | ICD-10-CM | POA: Diagnosis present

## 2022-01-09 DIAGNOSIS — R8290 Unspecified abnormal findings in urine: Secondary | ICD-10-CM | POA: Diagnosis present

## 2022-01-09 DIAGNOSIS — I7143 Infrarenal abdominal aortic aneurysm, without rupture: Principal | ICD-10-CM | POA: Diagnosis present

## 2022-01-09 DIAGNOSIS — I5032 Chronic diastolic (congestive) heart failure: Secondary | ICD-10-CM | POA: Diagnosis present

## 2022-01-09 DIAGNOSIS — N1831 Chronic kidney disease, stage 3a: Secondary | ICD-10-CM | POA: Diagnosis present

## 2022-01-09 DIAGNOSIS — R931 Abnormal findings on diagnostic imaging of heart and coronary circulation: Secondary | ICD-10-CM

## 2022-01-09 DIAGNOSIS — Z9049 Acquired absence of other specified parts of digestive tract: Secondary | ICD-10-CM

## 2022-01-09 DIAGNOSIS — Z888 Allergy status to other drugs, medicaments and biological substances status: Secondary | ICD-10-CM

## 2022-01-09 LAB — CBC WITH DIFFERENTIAL/PLATELET
Abs Immature Granulocytes: 0.02 K/uL (ref 0.00–0.07)
Basophils Absolute: 0 K/uL (ref 0.0–0.1)
Basophils Relative: 0 %
Eosinophils Absolute: 0 K/uL (ref 0.0–0.5)
Eosinophils Relative: 0 %
HCT: 44.9 % (ref 36.0–46.0)
Hemoglobin: 15.2 g/dL — ABNORMAL HIGH (ref 12.0–15.0)
Immature Granulocytes: 0 %
Lymphocytes Relative: 12 %
Lymphs Abs: 1 K/uL (ref 0.7–4.0)
MCH: 30.2 pg (ref 26.0–34.0)
MCHC: 33.9 g/dL (ref 30.0–36.0)
MCV: 89.1 fL (ref 80.0–100.0)
Monocytes Absolute: 0.4 K/uL (ref 0.1–1.0)
Monocytes Relative: 5 %
Neutro Abs: 7.1 K/uL (ref 1.7–7.7)
Neutrophils Relative %: 83 %
Platelets: 308 K/uL (ref 150–400)
RBC: 5.04 MIL/uL (ref 3.87–5.11)
RDW: 12.3 % (ref 11.5–15.5)
WBC: 8.6 K/uL (ref 4.0–10.5)
nRBC: 0 % (ref 0.0–0.2)

## 2022-01-09 LAB — URINALYSIS, ROUTINE W REFLEX MICROSCOPIC
Bilirubin Urine: NEGATIVE
Glucose, UA: NEGATIVE mg/dL
Hgb urine dipstick: NEGATIVE
Ketones, ur: 20 mg/dL — AB
Nitrite: NEGATIVE
Protein, ur: 100 mg/dL — AB
Specific Gravity, Urine: 1.035 — ABNORMAL HIGH (ref 1.005–1.030)
pH: 5 (ref 5.0–8.0)

## 2022-01-09 LAB — COMPREHENSIVE METABOLIC PANEL
ALT: 35 U/L (ref 0–44)
AST: 58 U/L — ABNORMAL HIGH (ref 15–41)
Albumin: 4.9 g/dL (ref 3.5–5.0)
Alkaline Phosphatase: 83 U/L (ref 38–126)
Anion gap: 19 — ABNORMAL HIGH (ref 5–15)
BUN: 36 mg/dL — ABNORMAL HIGH (ref 8–23)
CO2: 19 mmol/L — ABNORMAL LOW (ref 22–32)
Calcium: 10.6 mg/dL — ABNORMAL HIGH (ref 8.9–10.3)
Chloride: 95 mmol/L — ABNORMAL LOW (ref 98–111)
Creatinine, Ser: 1.9 mg/dL — ABNORMAL HIGH (ref 0.44–1.00)
GFR, Estimated: 27 mL/min — ABNORMAL LOW (ref >60)
Glucose, Bld: 124 mg/dL — ABNORMAL HIGH (ref 70–99)
Potassium: 5 mmol/L (ref 3.5–5.1)
Sodium: 133 mmol/L — ABNORMAL LOW (ref 135–145)
Total Bilirubin: 2 mg/dL — ABNORMAL HIGH (ref 0.3–1.2)
Total Protein: 9.3 g/dL — ABNORMAL HIGH (ref 6.5–8.1)

## 2022-01-09 LAB — LIPASE, BLOOD: Lipase: 39 U/L (ref 11–51)

## 2022-01-09 LAB — TSH: TSH: 0.836 u[IU]/mL (ref 0.350–4.500)

## 2022-01-09 LAB — MRSA NEXT GEN BY PCR, NASAL: MRSA by PCR Next Gen: NOT DETECTED

## 2022-01-09 MED ORDER — ASPIRIN EC 81 MG PO TBEC
81.0000 mg | DELAYED_RELEASE_TABLET | Freq: Every day | ORAL | Status: DC
  Administered 2022-01-09 – 2022-01-11 (×2): 81 mg via ORAL
  Filled 2022-01-09 (×2): qty 1

## 2022-01-09 MED ORDER — FLUTICASONE FUROATE-VILANTEROL 200-25 MCG/ACT IN AEPB
1.0000 | INHALATION_SPRAY | Freq: Every day | RESPIRATORY_TRACT | Status: DC
  Filled 2022-01-09: qty 28

## 2022-01-09 MED ORDER — OXYCODONE HCL 5 MG PO TABS
5.0000 mg | ORAL_TABLET | ORAL | Status: DC | PRN
  Administered 2022-01-11: 5 mg via ORAL
  Filled 2022-01-09: qty 1

## 2022-01-09 MED ORDER — BISACODYL 5 MG PO TBEC: 5.0000 mg | DELAYED_RELEASE_TABLET | Freq: Every day | ORAL | Status: DC | PRN

## 2022-01-09 MED ORDER — LABETALOL HCL 5 MG/ML IV SOLN
10.0000 mg | Freq: Once | INTRAVENOUS | Status: AC
  Administered 2022-01-09: 10 mg via INTRAVENOUS
  Filled 2022-01-09: qty 4

## 2022-01-09 MED ORDER — HEPARIN SODIUM (PORCINE) 5000 UNIT/ML IJ SOLN
5000.0000 [IU] | Freq: Two times a day (BID) | INTRAMUSCULAR | Status: DC
  Administered 2022-01-09 – 2022-01-11 (×3): 5000 [IU] via SUBCUTANEOUS
  Filled 2022-01-09 (×3): qty 1

## 2022-01-09 MED ORDER — IOHEXOL 350 MG/ML SOLN
75.0000 mL | Freq: Once | INTRAVENOUS | Status: AC | PRN
  Administered 2022-01-09: 75 mL via INTRAVENOUS

## 2022-01-09 MED ORDER — ACETAMINOPHEN 500 MG PO TABS
500.0000 mg | ORAL_TABLET | Freq: Three times a day (TID) | ORAL | Status: DC | PRN
  Administered 2022-01-11: 500 mg via ORAL
  Filled 2022-01-09: qty 1

## 2022-01-09 MED ORDER — ONDANSETRON HCL 4 MG/2ML IJ SOLN: 4.0000 mg | Freq: Four times a day (QID) | INTRAMUSCULAR | Status: DC | PRN

## 2022-01-09 MED ORDER — LACTATED RINGERS IV SOLN: INTRAVENOUS | Status: DC

## 2022-01-09 MED ORDER — MECLIZINE HCL 25 MG PO TABS
50.0000 mg | ORAL_TABLET | Freq: Four times a day (QID) | ORAL | Status: DC | PRN
  Filled 2022-01-09: qty 2

## 2022-01-09 MED ORDER — HYDROMORPHONE HCL 1 MG/ML IJ SOLN
0.5000 mg | INTRAMUSCULAR | Status: DC | PRN
  Administered 2022-01-10: 1 mg via INTRAVENOUS
  Filled 2022-01-09: qty 1

## 2022-01-09 MED ORDER — MORPHINE SULFATE (PF) 4 MG/ML IV SOLN: 4.0000 mg | Freq: Once | INTRAVENOUS | Status: DC

## 2022-01-09 MED ORDER — MELATONIN 3 MG PO TABS
3.0000 mg | ORAL_TABLET | Freq: Every day | ORAL | Status: DC
  Administered 2022-01-09 – 2022-01-10 (×2): 3 mg via ORAL
  Filled 2022-01-09 (×2): qty 1

## 2022-01-09 MED ORDER — UMECLIDINIUM BROMIDE 62.5 MCG/ACT IN AEPB
1.0000 | INHALATION_SPRAY | Freq: Every day | RESPIRATORY_TRACT | Status: DC
  Filled 2022-01-09: qty 7

## 2022-01-09 MED ORDER — METOPROLOL TARTRATE 25 MG PO TABS
25.0000 mg | ORAL_TABLET | Freq: Two times a day (BID) | ORAL | Status: DC
  Administered 2022-01-10 – 2022-01-11 (×2): 25 mg via ORAL
  Filled 2022-01-09 (×3): qty 1

## 2022-01-09 MED ORDER — LOSARTAN POTASSIUM 50 MG PO TABS
50.0000 mg | ORAL_TABLET | Freq: Every day | ORAL | Status: DC
  Administered 2022-01-09: 50 mg via ORAL
  Filled 2022-01-09 (×2): qty 1

## 2022-01-09 MED ORDER — FLUTICASONE FUROATE-VILANTEROL 100-25 MCG/ACT IN AEPB: 1.0000 | INHALATION_SPRAY | Freq: Every day | RESPIRATORY_TRACT | Status: DC

## 2022-01-09 MED ORDER — MORPHINE SULFATE (PF) 2 MG/ML IV SOLN
2.0000 mg | Freq: Once | INTRAVENOUS | Status: AC
  Administered 2022-01-09: 2 mg via INTRAVENOUS
  Filled 2022-01-09: qty 1

## 2022-01-09 MED ORDER — ALBUTEROL SULFATE (2.5 MG/3ML) 0.083% IN NEBU: 3.0000 mL | INHALATION_SOLUTION | Freq: Four times a day (QID) | RESPIRATORY_TRACT | Status: DC | PRN

## 2022-01-09 MED ORDER — SODIUM CHLORIDE 0.9 % IV SOLN: INTRAVENOUS | Status: AC

## 2022-01-09 MED ORDER — SENNOSIDES-DOCUSATE SODIUM 8.6-50 MG PO TABS: 1.0000 | ORAL_TABLET | Freq: Every evening | ORAL | Status: DC | PRN

## 2022-01-09 MED ORDER — ONDANSETRON HCL 4 MG PO TABS: 4.0000 mg | ORAL_TABLET | Freq: Four times a day (QID) | ORAL | Status: DC | PRN

## 2022-01-09 MED ORDER — SODIUM CHLORIDE 0.9 % IV BOLUS
500.0000 mL | Freq: Once | INTRAVENOUS | Status: AC
  Administered 2022-01-09: 500 mL via INTRAVENOUS

## 2022-01-09 NOTE — ED Triage Notes (Signed)
Pt BIB [facility] from home c/o abd pain and N/V. Pt diagnosed with stomach flu on Monday. Six months ago pt stated she had an aortic aneurysm that since then grew. A&Ox4  ?

## 2022-01-09 NOTE — Op Note (Signed)
[facility] [place] ? ?ASSESSMENT / PLAN: ?76 y.o. female with possible symptomatic abdominal aortic aneurysm. It has grown 2-3 mm over a month. Her symptoms are concerning, but her CT scan is reassuring as there are no signs of imminent rupture.  We will plan to admit her to the hospital and fix this on elective schedule tomorrow.   ? ?CHIEF COMPLAINT: Abdominal pain radiating to the back ? ?HISTORY OF PRESENT ILLNESS: ?Katie [name] is a 76 y.o. female who presents to the emergency room for evaluation of severe abdominal pain.  Patient reports earlier this week, she developed abdominal pain associated with nausea vomiting.  She called her primary care physician who suggested she likely had gastroenteritis and encouraged her to stay hydrated.  The pain started to improve yesterday.  Her nausea abated.  Earlier this morning, the patient had severe abdominal pain return.  The pain is epigastric and radiates to the back.  She has never had pain like this before.  A CT scan was performed of her abdomen pelvis and noted interval growth of a abdominal aortic aneurysm, although this growth was comparing ultrasound measurement to CT scan. ? ?Past Medical History:  ?Diagnosis Date  ? AAA (abdominal aortic aneurysm)   ? Arthritis   ? Cholelithiasis 2011  ? s/p cholescystectomy   ? Chronic diastolic congestive heart failure ([place])   ? COPD (chronic obstructive pulmonary disease) ([place])   ? Emphysema   ? GERD (gastroesophageal reflux disease)   ? History of heartburn   ? History of renal cell carcinoma   ? Hypercholesteremia   ? Hypertension   ? Hypertension   ? Mitral regurgitation   ? Pneumonia 1980's  ? Renal cell carcinoma 01/2010  ? s/p right radical nephrectomy 01/2010,  [facility]  ? S/P minimally invasive mitral valve repair 04/20/2019  ? Complex valvuloplasty including triangular resection of flail segment of posterior leaflet, artificial Gore-tex neochord placement x6 and 30 mm  Sorin Memo 4D ring annuloplasty via right mini thoracotomy approach  ? Shortness of breath   ? Tuberculosis   ? Tests positive for PPD. dad had h/o TB.  ? ? ?Past Surgical History:  ?Procedure Laterality Date  ? APPENDECTOMY  1970's  ? BUBBLE STUDY  03/18/2019  ? Procedure: BUBBLE STUDY;  Surgeon: Fay Records, MD;  Location: [facility];  Service: Cardiovascular;;  ? CARDIAC CATHETERIZATION  09/2011  ? CHOLECYSTECTOMY  01/2010  ? DIAGNOSTIC LAPAROSCOPY    ? KIDNEY SURGERY    ? LEFT AND RIGHT HEART CATHETERIZATION WITH CORONARY ANGIOGRAM N/A 09/30/2011  ? Procedure: LEFT AND RIGHT HEART CATHETERIZATION WITH CORONARY ANGIOGRAM;  Surgeon: Candee Furbish, MD;  Location: [facility] CATH LAB;  Service: Cardiovascular;  Laterality: N/A;  ? MITRAL VALVE REPAIR Right 04/20/2019  ? Procedure: MINIMALLY INVASIVE MITRAL VALVE REPAIR (MVR) USING MEMO 4D SIZE 30;  Surgeon: Rexene Alberts, MD;  Location: [facility];  Service: Open Heart Surgery;  Laterality: Right;  ? MULTIPLE EXTRACTIONS WITH ALVEOLOPLASTY  10/06/2011  ? Procedure: MULTIPLE EXTRACION WITH ALVEOLOPLASTY;  Surgeon: Lenn Cal, DDS;  Location: [facility];  Service: Oral Surgery;  Laterality: N/A;  Multiple extraction of tooth #'s 1, 6, 8, 10, 11, 22, 23, 26, 27, 28, 29 with alveoloplasty and Upper right buccal exostoses reductions.  ? NEPHRECTOMY RADICAL  01/2010  ? right   ? OVARIAN CYST REMOVAL  1970's  ? "went through belly button"  ? RIGHT/LEFT HEART CATH AND CORONARY ANGIOGRAPHY N/A 03/22/2019  ?  Procedure: RIGHT/LEFT HEART CATH AND CORONARY ANGIOGRAPHY;  Surgeon: [name], MD;  Location: [facility] CV LAB;  Service: Cardiovascular;  Laterality: N/A;  ? TEE WITHOUT CARDIOVERSION  10/01/2011  ? Procedure: TRANSESOPHAGEAL ECHOCARDIOGRAM (TEE);  Surgeon: [name];  Location: [place];  Service: Cardiovascular;  Laterality: N/A;  ? TEE WITHOUT CARDIOVERSION N/A 03/18/2019  ? Procedure: TRANSESOPHAGEAL ECHOCARDIOGRAM (TEE);  Surgeon: Fay Records, MD;   Location: [facility];  Service: Cardiovascular;  Laterality: N/A;  ? TEE WITHOUT CARDIOVERSION N/A 04/20/2019  ? Procedure: TRANSESOPHAGEAL ECHOCARDIOGRAM (TEE);  Surgeon: Rexene Alberts, MD;  Location: [facility];  Service: Open Heart Surgery;  Laterality: N/A;  ? TUBAL LIGATION  1970's  ? US ECHOCARDIOGRAPHY  09/2011  ? ? ?Family History  ?Problem Relation Age of Onset  ? COPD Mother   ?     DECEASED/SMOKED  ? Diabetes Brother   ? Diabetes Brother   ? ? ?Social History  ? ?Socioeconomic History  ? Marital status: Married  ?  Spouse name: Not on file  ? Number of children: 3  ? Years of education: 82  ? Highest education level: Not on file  ?Occupational History  ? Occupation: Freight forwarder  ?  Employer: IT trainer  ?  Comment: manages cleaning business  ?Tobacco Use  ? Smoking status: Some Days  ?  Packs/day: 1.00  ?  Years: 53.00  ?  Pack years: 53.00  ?  Types: Cigarettes  ?  Last attempt to quit: 03/18/2019  ?  Years since quitting: 2.8  ?  Passive exposure: Current (Son who lives with her Smokes)  ? Smokeless tobacco: Never  ?Vaping Use  ? Vaping Use: Never used  ?Substance and Sexual Activity  ? Alcohol use: Yes  ?  Alcohol/week: 0.0 standard drinks  ?  Comment:  "drink very rarely"  ? Drug use: Not Currently  ?  Types: Cocaine  ?  Comment: hx of cocaine last in 2006  ? Sexual activity: Never  ?Other Topics Concern  ? Not on file  ?Social History Narrative  ?   ?    ? Lives in [place] with her husband.   ? 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   ? Patient has 3 grown children that do not live with her.   ? Patient does not have any medical insurance.  ? ?Social Determinants of Health  ? ?Financial Resource Strain: Not on file  ?Food Insecurity: Not on file  ?Transportation Needs: Not on file  ?Physical Activity: Not on file  ?Stress: Not on file  ?Social Connections: Not on file  ?Intimate Partner Violence: Not on file  ? ? ?Allergies  ?Allergen Reactions  ? Ace  Inhibitors Other (See Comments)  ?  Pseudoasthma/ renal failure   ? ? ?Current Facility-Administered Medications  ?Medication Dose Route Frequency Provider Last Rate Last Admin  ? lactated ringers infusion   Intravenous Continuous Cherre Robins, MD 125 mL/hr at 01/09/22 1445 New Bag at 01/09/22 1445  ? ?Current Outpatient Medications  ?Medication Sig Dispense Refill  ? acetaminophen (TYLENOL) 500 MG tablet Take 500 mg by mouth every 8 (eight) hours as needed for mild pain.    ? albuterol (VENTOLIN HFA) 108 (90 Base) MCG/ACT inhaler Inhale 2 puffs into the lungs every 6 (six) hours as needed for wheezing or shortness of breath. 8 g 5  ? aspirin EC 81 MG tablet Take 1 tablet (81 mg total) by  mouth daily. 30 tablet 0  ? aspirin-sod bicarb-citric acid (ALKA-SELTZER) 325 MG TBEF tablet Take 325 mg by mouth daily as needed.    ? losartan (COZAAR) 50 MG tablet Take 50 mg by mouth daily.    ? meclizine (ANTIVERT) 25 MG tablet Take 50 mg by mouth 4 (four) times daily as needed for dizziness or nausea.    ? metoprolol tartrate (LOPRESSOR) 25 MG tablet Take 1 tablet (25 mg total) by mouth 2 (two) times daily. (Patient taking differently: Take 25 mg by mouth daily.) 60 tablet 2  ? OVER THE COUNTER MEDICATION Take 1 tablet by mouth at bedtime. Blue tablet. [employer] or [facility] sleeping aid    ? PROLIA 60 MG/ML SOSY injection Inject 60 mg into the skin every 6 (six) months.    ? TRELEGY ELLIPTA 200-62.5-25 MCG/INH AEPB Inhale 1 puff into the lungs daily.    ? Fluticasone-Umeclidin-Vilant (TRELEGY ELLIPTA) 100-62.5-25 MCG/INH AEPB Inhale 1 puff into the lungs daily. Further refills will need to come from new primary provider (Patient not taking: Reported on 01/09/2022) 28 each 3  ? Misc. Devices (PULSE OXIMETER FOR FINGER) MISC 1 Device by Does not apply route as needed. 1 each 0  ? ? ?PHYSICAL EXAM ?Vitals:  ? 01/09/22 1215 01/09/22 1315 01/09/22 1400 01/09/22 1445  ?BP: (!) 122/91 118/89 123/90 104/88  ?Pulse: 77 74 74 78   ?Resp: 18 (!) 21 17 (!) 26  ?Temp:      ?TempSrc:      ?SpO2: 94% 100% 100% 100%  ?Weight:      ?Height:      ? ? ?Constitutional: well appearing. no distress. Appears well nourished.  ?Neurologic: CN i

## 2022-01-09 NOTE — Consult Note (Addendum)
?Cardiology Consultation:  ? ?Patient ID: Katie [name] ?MRN: [id number]; DOB: [birth year] ? ?Admit date: 01/09/2022 ?Date of Consult: 01/09/2022 ? ?PCP:  Roselee Nova, MD ?  ?[facility] ?Cardiologist:  Skeet Latch, MD      ? ? ?Patient Profile:  ? ?Katie [name] is a 76 y.o. female with a hx of mitral valve repair, nonobstructive CAD, chronic diastolic heart failure, carotid artery disease, hypertension, hyperlipidemia, AAA, COPD, renal cell carcinoma s/p right nephrectomy 2011, CKD stage III and tobacco abuse who is being seen 01/09/2022 for the evaluation of preoperative clearance at the request of Dr. Roosevelt Locks. ? ?History of Present Illness:  ? ?[name] is a 76 year old female with past medical history of mitral valve repair, nonobstructive CAD, chronic diastolic heart failure, carotid artery disease, hypertension, hyperlipidemia, AAA, COPD, renal cell carcinoma s/p right nephrectomy 2011, CKD stage III and tobacco abuse.  Patient was previously admitted in May 2020 with lower extremity edema and shortness of breath.  She was found to have severe MR on TTE and a TEE.  Left and right heart cath revealed 25% lesion in proximal to mid left circumflex artery and proximal LAD, severe MR.  Patient was seen by Dr. Roxy Manns and underwent minimally invasive mitral valve repair on 04/20/2019 using a Sorin memo for the ring annuloplasty, artificial Gore-Tex neo-cord placement x6, triangular resection of the flail segment P2 of the posterior leaflet.  Postprocedure, patient's hospital course was complicated by postop atrial fibrillation.  She was placed on amiodarone therapy.  She was also anticoagulated with Coumadin therapy.  I last saw her in July 2020 at which time she was doing well.  She has a chronic dry cough related to COPD.  Unfortunately she was lost to follow-up for 2 years until July 2022 when she came in with an episode of syncope.  Troponin, CBC and basic metabolic panel obtained in the ED  was unremarkable.  Heart monitor showed predominantly sinus rhythm, 2 episodes of nonsustained VT lasting 5 beats each, 10 episodes of SVT, longest 12.4 seconds.  Subsequent echocardiogram obtained on 06/07/2021 demonstrated EF 50 to 55%, trivial MR, mitral valve has been repaired with prosthetic annuloplasty ring present in the mitral valve position, report also mention hypokinesis of the LV basal mid inferior wall.  Patient has been followed by Dr. Doren Custard due to infrarenal AAA.  Previous AAA Doppler obtained in August 2022 demonstrated abdominal aorta measuring at 4.43 cm.  Repeat AAA Doppler obtained on 12/12/2021 demonstrated abdominal aorta increased to 4.88 cm.  Since Monday, she has been having quite significant lower abdominal pain.  Prior to that, she is very independent.  She does not drive but has been able to climb up a hill to go to the supermarket on a weekly basis without exertional chest pain or worsening dyspnea.  Her previous dry cough has also improved after she cut back on smoking. ? ?Due to worsening abdominal pain, she presented to [facility] for further evaluation.  Patient was seen by vascular surgery who plan to proceed with elective surgery tomorrow.  Cardiologist service consulted for preoperative clearance. ? ? ?Past Medical History:  ?Diagnosis Date  ? AAA (abdominal aortic aneurysm)   ? Arthritis   ? Cholelithiasis 2011  ? s/p cholescystectomy   ? Chronic diastolic congestive heart failure ([place])   ? COPD (chronic obstructive pulmonary disease) ([place])   ? Emphysema   ? GERD (gastroesophageal reflux disease)   ? History of heartburn   ? History  of renal cell carcinoma   ? Hypercholesteremia   ? Hypertension   ? Hypertension   ? Mitral regurgitation   ? Pneumonia 1980's  ? Renal cell carcinoma 01/2010  ? s/p right radical nephrectomy 01/2010,  [facility]  ? S/P minimally invasive mitral valve repair 04/20/2019  ? Complex valvuloplasty including triangular resection of  flail segment of posterior leaflet, artificial Gore-tex neochord placement x6 and 30 mm Sorin Memo 4D ring annuloplasty via right mini thoracotomy approach  ? Shortness of breath   ? Tuberculosis   ? Tests positive for PPD. dad had h/o TB.  ? ? ?Past Surgical History:  ?Procedure Laterality Date  ? APPENDECTOMY  1970's  ? BUBBLE STUDY  03/18/2019  ? Procedure: BUBBLE STUDY;  Surgeon: Fay Records, MD;  Location: [place];  Service: Cardiovascular;;  ? CARDIAC CATHETERIZATION  09/2011  ? CHOLECYSTECTOMY  01/2010  ? DIAGNOSTIC LAPAROSCOPY    ? KIDNEY SURGERY    ? LEFT AND RIGHT HEART CATHETERIZATION WITH CORONARY ANGIOGRAM N/A 09/30/2011  ? Procedure: LEFT AND RIGHT HEART CATHETERIZATION WITH CORONARY ANGIOGRAM;  Surgeon: Candee Furbish, MD;  Location: [facility] CATH LAB;  Service: Cardiovascular;  Laterality: N/A;  ? MITRAL VALVE REPAIR Right 04/20/2019  ? Procedure: MINIMALLY INVASIVE MITRAL VALVE REPAIR (MVR) USING MEMO 4D SIZE 30;  Surgeon: Rexene Alberts, MD;  Location: [place];  Service: Open Heart Surgery;  Laterality: Right;  ? MULTIPLE EXTRACTIONS WITH ALVEOLOPLASTY  10/06/2011  ? Procedure: MULTIPLE EXTRACION WITH ALVEOLOPLASTY;  Surgeon: Lenn Cal, DDS;  Location: [place];  Service: Oral Surgery;  Laterality: N/A;  Multiple extraction of tooth #'s 1, 6, 8, 10, 11, 22, 23, 26, 27, 28, 29 with alveoloplasty and Upper right buccal exostoses reductions.  ? NEPHRECTOMY RADICAL  01/2010  ? right   ? OVARIAN CYST REMOVAL  1970's  ? "went through belly button"  ? RIGHT/LEFT HEART CATH AND CORONARY ANGIOGRAPHY N/A 03/22/2019  ? Procedure: RIGHT/LEFT HEART CATH AND CORONARY ANGIOGRAPHY;  Surgeon: [name], MD;  Location: [place] CV LAB;  Service: Cardiovascular;  Laterality: N/A;  ? TEE WITHOUT CARDIOVERSION  10/01/2011  ? Procedure: TRANSESOPHAGEAL ECHOCARDIOGRAM (TEE);  Surgeon: [name];  Location: [place];  Service: Cardiovascular;  Laterality: N/A;  ? TEE WITHOUT CARDIOVERSION N/A  03/18/2019  ? Procedure: TRANSESOPHAGEAL ECHOCARDIOGRAM (TEE);  Surgeon: Fay Records, MD;  Location: [place];  Service: Cardiovascular;  Laterality: N/A;  ? TEE WITHOUT CARDIOVERSION N/A 04/20/2019  ? Procedure: TRANSESOPHAGEAL ECHOCARDIOGRAM (TEE);  Surgeon: Rexene Alberts, MD;  Location: [place];  Service: Open Heart Surgery;  Laterality: N/A;  ? TUBAL LIGATION  1970's  ? US ECHOCARDIOGRAPHY  09/2011  ?  ? ?Home Medications:  ?Prior to Admission medications   ?Medication Sig Start Date End Date Taking? Authorizing Provider  ?acetaminophen (TYLENOL) 500 MG tablet Take 500 mg by mouth every 8 (eight) hours as needed for mild pain.   Yes [provider]  ?albuterol (VENTOLIN HFA) 108 (90 Base) MCG/ACT inhaler Inhale 2 puffs into the lungs every 6 (six) hours as needed for wheezing or shortness of breath. 02/08/20  Yes Brunetta Jeans, PA-C  ?aspirin EC 81 MG tablet Take 1 tablet (81 mg total) by mouth daily. 09/29/18  Yes Bonnielee Haff, MD  ?aspirin-sod bicarb-citric acid (ALKA-SELTZER) 325 MG TBEF tablet Take 325 mg by mouth daily as needed.   Yes [provider]  ?losartan (COZAAR) 50 MG tablet Take 50 mg by mouth  daily. 11/25/21  Yes [provider]  ?meclizine (ANTIVERT) 25 MG tablet Take 50 mg by mouth 4 (four) times daily as needed for dizziness or nausea.   Yes [provider]  ?metoprolol tartrate (LOPRESSOR) 25 MG tablet Take 1 tablet (25 mg total) by mouth 2 (two) times daily. ?Patient taking differently: Take 25 mg by mouth daily. 04/23/21  Yes Kennyth Arnold, FNP  ?OVER THE COUNTER MEDICATION Take 1 tablet by mouth at bedtime. Blue tablet. [employer] or [facility] sleeping aid   Yes [provider]  ?PROLIA 60 MG/ML SOSY injection Inject 60 mg into the skin every 6 (six) months. 05/16/21  Yes [provider]  ?Donnal Debar 200-62.5-25 MCG/INH AEPB Inhale 1 puff into the lungs daily. 07/17/21  Yes [provider]  ?Fluticasone-Umeclidin-Vilant  (TRELEGY ELLIPTA) 100-62.5-25 MCG/INH AEPB Inhale 1 puff into the lungs daily. Further refills will need to come from new primary provider ?Patient not taking: Reported on 01/09/2022 12/21/20   Newell Coral

## 2022-01-09 NOTE — ED Notes (Signed)
Dr Stanford Breed rounded on pt, states they will do surgery in the AM, verbal order for LR @ 125 at this time ?

## 2022-01-09 NOTE — ED Notes (Signed)
Pt sitting up in stretcher c/o 10/10 pain in abdomen/back. RN admin '2mg'$  IV morphine per order. Pt states pain improves to 5/10 after morphine admin. Breathing and HR improve with pain control. Pt hypertensive, reports she hasn't taken her BP meds since Monday d/t this pain/nausea. EDP updated on same. ?

## 2022-01-09 NOTE — ED Provider Notes (Signed)
Informed consent obtained from patient regarded contrasted CT given her decreased GFR <30. Discussed the risks and benefits of obtaining contrasted CT study. She has given this provider verbal consent to proceed with imaging. Dissection study CT was requested by Dr. Standley Dakins with vascular surgery to evaluate aorta for anticipated operative intervention of AAA.  ?  ?Mickie Hillier, PA-C ?01/09/22 1246 ? ?  ?Tegeler, Gwenyth Allegra, MD ?01/09/22 1518 ? ?

## 2022-01-09 NOTE — H&P (Addendum)
?History and Physical  ? ? ?Katie [name] XTK:[id number] DOB: 21-Jan-1946 DOA: 01/09/2022 ? ?PCP: Roselee Nova, MD (Confirm with patient/family/NH records and if not entered, this has to be entered at [facility] point of entry) ?Patient coming from: Home ? ?I have personally briefly reviewed patient's old medical records in [place] ? ?Chief Complaint: Feeling better ? ?HPI: Katie [name] is a 76 y.o. female with medical history significant of AAA, COPD, HTN, CKD stage IIIa, mitral regurgitation status post clipping 2020, renal cell carcinoma status post nephrectomy, osteoporosis, presented with persistent symptoms of nauseous vomiting diarrhea and abdominal pain. ? ?Symptoms started 4 days ago, started with sharp-like epigastric pain constant, 7-8/10, along with repeated nauseous vomiting of stomach content, nonbilious nonbloody, and loose bowel movement 5-6 times a day.  Denies any fever or chills.  Has been eating and drinking very little since Monday for the GI symptoms.  Called PCP and was told likely viral gastroenteritis.  Symptoms persisted today, called PCP again who referred her to come to ED. ? ?She has a history of AAA, was followed up with vascular surgery last month and was told size has been stable and recommended next follow-up in 6 months. ? ?ED Course: Blood pressure significantly elevated, CT angiogram showed increasing of size of the AAA from 4.7 cm to 4.9 cm.  Vascular surgery consulted and recommend AAA repair tomorrow. ? ?After given multiple doses of Zofran and pain medications, abdominal pain significant improved and blood pressure also became controlled. ? ?Review of Systems: As per HPI otherwise 14 point review of systems negative.  ? ? ?Past Medical History:  ?Diagnosis Date  ? AAA (abdominal aortic aneurysm)   ? Arthritis   ? Cholelithiasis 2011  ? s/p cholescystectomy   ? Chronic diastolic congestive heart failure ([place])   ? COPD (chronic obstructive pulmonary disease) ([place])   ?  Emphysema   ? GERD (gastroesophageal reflux disease)   ? History of heartburn   ? History of renal cell carcinoma   ? Hypercholesteremia   ? Hypertension   ? Hypertension   ? Mitral regurgitation   ? Pneumonia 1980's  ? Renal cell carcinoma 01/2010  ? s/p right radical nephrectomy 01/2010,  [facility]  ? S/P minimally invasive mitral valve repair 04/20/2019  ? Complex valvuloplasty including triangular resection of flail segment of posterior leaflet, artificial Gore-tex neochord placement x6 and 30 mm Sorin Memo 4D ring annuloplasty via right mini thoracotomy approach  ? Shortness of breath   ? Tuberculosis   ? Tests positive for PPD. dad had h/o TB.  ? ? ?Past Surgical History:  ?Procedure Laterality Date  ? APPENDECTOMY  1970's  ? BUBBLE STUDY  03/18/2019  ? Procedure: BUBBLE STUDY;  Surgeon: Fay Records, MD;  Location: [place];  Service: Cardiovascular;;  ? CARDIAC CATHETERIZATION  09/2011  ? CHOLECYSTECTOMY  01/2010  ? DIAGNOSTIC LAPAROSCOPY    ? KIDNEY SURGERY    ? LEFT AND RIGHT HEART CATHETERIZATION WITH CORONARY ANGIOGRAM N/A 09/30/2011  ? Procedure: LEFT AND RIGHT HEART CATHETERIZATION WITH CORONARY ANGIOGRAM;  Surgeon: Candee Furbish, MD;  Location: [facility] CATH LAB;  Service: Cardiovascular;  Laterality: N/A;  ? MITRAL VALVE REPAIR Right 04/20/2019  ? Procedure: MINIMALLY INVASIVE MITRAL VALVE REPAIR (MVR) USING MEMO 4D SIZE 30;  Surgeon: Rexene Alberts, MD;  Location: [place];  Service: Open Heart Surgery;  Laterality: Right;  ? MULTIPLE EXTRACTIONS WITH ALVEOLOPLASTY  10/06/2011  ? Procedure: MULTIPLE EXTRACION WITH ALVEOLOPLASTY;  Surgeon: Lenn Cal, DDS;  Location: [place];  Service: Oral Surgery;  Laterality: N/A;  Multiple extraction of tooth #'s 1, 6, 8, 10, 11, 22, 23, 26, 27, 28, 29 with alveoloplasty and Upper right buccal exostoses reductions.  ? NEPHRECTOMY RADICAL  01/2010  ? right   ? OVARIAN CYST REMOVAL  1970's  ? "went through belly button"  ? RIGHT/LEFT HEART CATH AND  CORONARY ANGIOGRAPHY N/A 03/22/2019  ? Procedure: RIGHT/LEFT HEART CATH AND CORONARY ANGIOGRAPHY;  Surgeon: [name], MD;  Location: [place] CV LAB;  Service: Cardiovascular;  Laterality: N/A;  ? TEE WITHOUT CARDIOVERSION  10/01/2011  ? Procedure: TRANSESOPHAGEAL ECHOCARDIOGRAM (TEE);  Surgeon: [name];  Location: [place];  Service: Cardiovascular;  Laterality: N/A;  ? TEE WITHOUT CARDIOVERSION N/A 03/18/2019  ? Procedure: TRANSESOPHAGEAL ECHOCARDIOGRAM (TEE);  Surgeon: Fay Records, MD;  Location: [place];  Service: Cardiovascular;  Laterality: N/A;  ? TEE WITHOUT CARDIOVERSION N/A 04/20/2019  ? Procedure: TRANSESOPHAGEAL ECHOCARDIOGRAM (TEE);  Surgeon: Rexene Alberts, MD;  Location: [place];  Service: Open Heart Surgery;  Laterality: N/A;  ? TUBAL LIGATION  1970's  ? US ECHOCARDIOGRAPHY  09/2011  ? ? ? reports that she has been smoking cigarettes. She has a 53.00 pack-year smoking history. She has been exposed to tobacco smoke. She has never used smokeless tobacco. She reports current alcohol use. She reports that she does not currently use drugs after having used the following drugs: Cocaine. ? ?Allergies  ?Allergen Reactions  ? Ace Inhibitors Other (See Comments)  ?  Pseudoasthma/ renal failure   ? ? ?Family History  ?Problem Relation Age of Onset  ? COPD Mother   ?     DECEASED/SMOKED  ? Diabetes Brother   ? Diabetes Brother   ? ? ? ?Prior to Admission medications   ?Medication Sig Start Date End Date Taking? Authorizing Provider  ?acetaminophen (TYLENOL) 500 MG tablet Take 500 mg by mouth every 8 (eight) hours as needed for mild pain.   Yes [provider]  ?albuterol (VENTOLIN HFA) 108 (90 Base) MCG/ACT inhaler Inhale 2 puffs into the lungs every 6 (six) hours as needed for wheezing or shortness of breath. 02/08/20  Yes Brunetta Jeans, PA-C  ?aspirin EC 81 MG tablet Take 1 tablet (81 mg total) by mouth daily. 09/29/18  Yes Bonnielee Haff, MD  ?aspirin-sod  bicarb-citric acid (ALKA-SELTZER) 325 MG TBEF tablet Take 325 mg by mouth daily as needed.   Yes [provider]  ?losartan (COZAAR) 50 MG tablet Take 50 mg by mouth daily. 11/25/21  Yes [provider]  ?meclizine (ANTIVERT) 25 MG tablet Take 50 mg by mouth 4 (four) times daily as needed for dizziness or nausea.   Yes [provider]  ?metoprolol tartrate (LOPRESSOR) 25 MG tablet Take 1 tablet (25 mg total) by mouth 2 (two) times daily. ?Patient taking differently: Take 25 mg by mouth daily. 04/23/21  Yes Kennyth Arnold, FNP  ?OVER THE COUNTER MEDICATION Take 1 tablet by mouth at bedtime. Blue tablet. [employer] or [facility] sleeping aid   Yes [provider]  ?PROLIA 60 MG/ML SOSY injection Inject 60 mg into the skin every 6 (six) months. 05/16/21  Yes [provider]  ?Donnal Debar 200-62.5-25 MCG/INH AEPB Inhale 1 puff into the lungs daily. 07/17/21  Yes [provider]  ?Fluticasone-Umeclidin-Vilant (TRELEGY ELLIPTA) 100-62.5-25 MCG/INH AEPB Inhale 1 puff into the lungs daily. Further refills will need to come from new primary provider ?  Patient not taking: Reported on 01/09/2022 12/21/20   Kennyth Arnold, FNP  ?Misc. Devices (PULSE OXIMETER FOR FINGER) MISC 1 Device by Does not apply route as needed. 03/11/19   Brunetta Jeans, PA-C  ? ? ?Physical Exam: ?Vitals:  ? 01/09/22 1315 01/09/22 1400 01/09/22 1445 01/09/22 1515  ?BP: 118/89 123/90 104/88 128/86  ?Pulse: 74 74 78 82  ?Resp: (!) 21 17 (!) 26 19  ?Temp:      ?TempSrc:      ?SpO2: 100% 100% 100% 100%  ?Weight:      ?Height:      ? ? ?Constitutional: NAD, calm, comfortable ?Vitals:  ? 01/09/22 1315 01/09/22 1400 01/09/22 1445 01/09/22 1515  ?BP: 118/89 123/90 104/88 128/86  ?Pulse: 74 74 78 82  ?Resp: (!) 21 17 (!) 26 19  ?Temp:      ?TempSrc:      ?SpO2: 100% 100% 100% 100%  ?Weight:      ?Height:      ? ?Eyes: PERRL, lids and conjunctivae normal ?ENMT: Mucous membranes are moist. Posterior pharynx clear of  any exudate or lesions.Normal dentition.  ?Neck: normal, supple, no masses, no thyromegaly ?Respiratory: clear to auscultation bilaterally, no wheezing, no crackles. Normal respiratory effort. No accessory muscl

## 2022-01-09 NOTE — Progress Notes (Signed)
D/W cardiology, regarding abdominal echocardiogram August 2022, cardiology will see the patient for preop evaluation in AM. ? ?Updated vascular surgery Dr. Stanford Breed, who agreed to hold off AAA repair until after cardiology evaluation. ?

## 2022-01-09 NOTE — ED Provider Notes (Signed)
?[facility] ?Provider Note ? ? ?CSN: [id number] ?Arrival date & time: 01/09/22  0630 ? ?  ? ?History ? ?Chief Complaint  ?Patient presents with  ? Abdominal Pain  ? ? ?Katie [name] is a 76 y.o. female.  With past medical history of hypertension, COPD, diastolic heart failure, CKD, AAA who presents to the emergency department with abdominal pain. ? ?States that on Monday night she began having abdominal pain.  She states that she called her PCP who told her it was likely a virus and should last about 48 hours.  She states that yesterday she woke up feeling okay.  By the evening she states that she began having nausea and diarrhea.  She endorses severe epigastric abdominal pain that radiates to the left upper quadrant.  Does not radiate to the back. She states that it is sharp and intermittent but progressively worsening.  She endorses associated hot and cold chills.  She denies back pain, chest pain or shortness of breath, numbness or tingling in her extremities.  Denies urinary symptoms.  ? ? ?Abdominal Pain ?Associated symptoms: chills, diarrhea and nausea   ?Associated symptoms: no chest pain, no dysuria, no fever, no shortness of breath and no vomiting   ? ?  ? ?Home Medications ?Prior to Admission medications   ?Medication Sig Start Date End Date Taking? Authorizing Provider  ?acetaminophen (TYLENOL) 500 MG tablet Take 1,000 mg by mouth every 6 (six) hours as needed for headache (pain).    [provider]  ?albuterol (PROVENTIL) (2.5 MG/3ML) 0.083% nebulizer solution Take 3 mLs (2.5 mg total) by nebulization every 6 (six) hours as needed for wheezing or shortness of breath. 03/30/19   Brunetta Jeans, PA-C  ?albuterol (VENTOLIN HFA) 108 (90 Base) MCG/ACT inhaler Inhale 2 puffs into the lungs every 6 (six) hours as needed for wheezing or shortness of breath. 02/08/20   Brunetta Jeans, PA-C  ?aspirin EC 81 MG tablet Take 1 tablet (81 mg total) by mouth daily.  09/29/18   Bonnielee Haff, MD  ?atorvastatin (LIPITOR) 80 MG tablet Take 80 mg by mouth at bedtime. 05/15/21   [provider]  ?Fluticasone-Umeclidin-Vilant (TRELEGY ELLIPTA) 100-62.5-25 MCG/INH AEPB Inhale 1 puff into the lungs daily. Further refills will need to come from new primary provider 12/21/20   Kennyth Arnold, FNP  ?losartan (COZAAR) 50 MG tablet Take 50 mg by mouth daily. 11/25/21   [provider]  ?metoprolol tartrate (LOPRESSOR) 25 MG tablet Take 1 tablet (25 mg total) by mouth 2 (two) times daily. 04/23/21   Kennyth Arnold, FNP  ?Misc. Devices (PULSE OXIMETER FOR FINGER) MISC 1 Device by Does not apply route as needed. 03/11/19   Brunetta Jeans, PA-C  ?PROLIA 60 MG/ML SOSY injection Inject 60 mg into the skin every 6 (six) months. 05/16/21   [provider]  ?Donnal Debar 200-62.5-25 MCG/INH AEPB Inhale 1 puff into the lungs daily. 07/17/21   [provider]  ?   ? ?Allergies    ?Ace inhibitors   ? ?Review of Systems   ?Review of Systems  ?Constitutional:  Positive for chills. Negative for fever.  ?Respiratory:  Negative for shortness of breath.   ?Cardiovascular:  Negative for chest pain.  ?Gastrointestinal:  Positive for abdominal pain, diarrhea and nausea. Negative for vomiting.  ?Genitourinary:  Negative for dysuria.  ?Musculoskeletal:  Negative for back pain.  ?Neurological:  Negative for numbness.  ? ?Physical Exam ?Updated Vital Signs ?BP Marland Kitchen)  151/117 (BP Location: Left Arm)   Pulse 94   Temp 97.7 ?F (36.5 ?C) (Oral)   Resp (!) 24   Ht '5\' 1"'$  (1.549 m)   Wt 66.2 kg   SpO2 100%   BMI 27.59 kg/m?  ?Physical Exam ?Vitals and nursing note reviewed.  ?Constitutional:   ?   General: She is in acute distress.  ?   Appearance: She is well-developed and normal weight. She is ill-appearing. She is not toxic-appearing.  ?HENT:  ?   Head: Normocephalic and atraumatic.  ?   Mouth/Throat:  ?   Mouth: Mucous membranes are moist.  ?Eyes:  ?   Extraocular Movements:  Extraocular movements intact.  ?Cardiovascular:  ?   Rate and Rhythm: Regular rhythm. Tachycardia present.  ?   Heart sounds: Murmur heard.  ?Pulmonary:  ?   Effort: Tachypnea present. No respiratory distress.  ?   Breath sounds: Normal breath sounds.  ?Abdominal:  ?   General: Bowel sounds are normal. There is no distension.  ?   Palpations: Abdomen is soft.  ?   Tenderness: There is generalized abdominal tenderness and tenderness in the epigastric area, periumbilical area and left lower quadrant. There is no guarding or rebound.  ?   Hernia: No hernia is present.  ?Skin: ?   General: Skin is warm and dry.  ?   Capillary Refill: Capillary refill takes less than 2 seconds.  ?Neurological:  ?   General: No focal deficit present.  ?   Mental Status: She is alert and oriented to person, place, and time.  ?   GCS: GCS eye subscore is 4. GCS verbal subscore is 5. GCS motor subscore is 6.  ?   Cranial Nerves: No cranial nerve deficit.  ?   Sensory: No sensory deficit.  ?   Motor: No weakness.  ?Psychiatric:     ?   Mood and Affect: Mood is anxious.  ? ? ?ED Results / Procedures / Treatments   ?Labs ?(all labs ordered are listed, but only abnormal results are displayed) ?Labs Reviewed  ?COMPREHENSIVE METABOLIC PANEL - Abnormal; Notable for the following components:  ?    Result Value  ? Sodium 133 (*)   ? Chloride 95 (*)   ? CO2 19 (*)   ? Glucose, Bld 124 (*)   ? BUN 36 (*)   ? Creatinine, Ser 1.90 (*)   ? Calcium 10.6 (*)   ? Total Protein 9.3 (*)   ? AST 58 (*)   ? Total Bilirubin 2.0 (*)   ? GFR, Estimated 27 (*)   ? Anion gap 19 (*)   ? All other components within normal limits  ?URINALYSIS, ROUTINE W REFLEX MICROSCOPIC - Abnormal; Notable for the following components:  ? Color, Urine AMBER (*)   ? APPearance HAZY (*)   ? Specific Gravity, Urine 1.035 (*)   ? Ketones, ur 20 (*)   ? Protein, ur 100 (*)   ? Leukocytes,Ua SMALL (*)   ? Bacteria, UA RARE (*)   ? Non Squamous Epithelial 0-5 (*)   ? All other components  within normal limits  ?CBC WITH DIFFERENTIAL/PLATELET - Abnormal; Notable for the following components:  ? Hemoglobin 15.2 (*)   ? All other components within normal limits  ?LIPASE, BLOOD  ?CBC WITH DIFFERENTIAL/PLATELET  ? ?EKG ?EKG Interpretation ? ?Date/Time:  Thursday January 09 2022 06:35:56 EDT ?Ventricular Rate:  98 ?PR Interval:  145 ?QRS Duration: 88 ?QT Interval:  395 ?QTC  Calculation: 505 ?R Axis:   0 ?Text Interpretation: Sinus rhythm LAE, consider biatrial enlargement Probable left ventricular hypertrophy Prolonged QT interval when compared to prior, similar longer QTc. No STEMI Confirmed by Antony Blackbird [phone]) on 01/09/2022 7:09:36 AM ? ?Radiology ?CT ABDOMEN PELVIS WO CONTRAST ? ?Result Date: 01/09/2022 ?CLINICAL DATA:  Abdominal pain EXAM: CT ABDOMEN AND PELVIS WITHOUT CONTRAST TECHNIQUE: Multidetector CT imaging of the abdomen and pelvis was performed following the standard protocol without IV contrast. RADIATION DOSE REDUCTION: This exam was performed according to the departmental dose-optimization program which includes automated exposure control, adjustment of the mA and/or kV according to patient size and/or use of iterative reconstruction technique. COMPARISON:  CT done on 07/23/2021 FINDINGS: Lower chest: Small pleural plaque seen in the lateral aspect of right lower lung fields has not changed significantly. There is calcification in the pleura near the dome of right hemidiaphragm with no significant change. Dense calcifications are seen in the mitral annulus. Hepatobiliary: There are few low-density lesions in the liver largest measuring 3.6 cm in size in the right lobe with no significant interval change. Surgical clips are seen in gallbladder fossa. Pancreas: No focal abnormality is seen. Spleen: Unremarkable. Adrenals/Urinary Tract: There is evidence of right nephrectomy. Left kidney shows no hydronephrosis. Mild nodularity in the left adrenal has not changed. There are no demonstrable  renal or ureteral stones on the left side. Urinary bladder is unremarkable. Stomach/Bowel: Stomach is unremarkable. Small bowel loops are not dilated. Appendix is not seen. There is no significant wall thickening in c

## 2022-01-10 ENCOUNTER — Encounter (HOSPITAL_COMMUNITY)
Admission: EM | Disposition: A | Discharge status: Home / Self Care | Payer: Self-pay | Source: Home / Self Care | Attending: Family Medicine

## 2022-01-10 ENCOUNTER — Inpatient Hospital Stay (HOSPITAL_COMMUNITY): Payer: Medicare Other

## 2022-01-10 ENCOUNTER — Inpatient Hospital Stay (HOSPITAL_COMMUNITY): Payer: Medicare Other | Admitting: Anesthesiology

## 2022-01-10 ENCOUNTER — Other Ambulatory Visit: Payer: Self-pay

## 2022-01-10 DIAGNOSIS — I714 Abdominal aortic aneurysm, without rupture, unspecified: Secondary | ICD-10-CM

## 2022-01-10 DIAGNOSIS — E041 Nontoxic single thyroid nodule: Secondary | ICD-10-CM

## 2022-01-10 DIAGNOSIS — I5032 Chronic diastolic (congestive) heart failure: Secondary | ICD-10-CM

## 2022-01-10 DIAGNOSIS — K7689 Other specified diseases of liver: Secondary | ICD-10-CM

## 2022-01-10 DIAGNOSIS — I110 Hypertensive heart disease with heart failure: Secondary | ICD-10-CM

## 2022-01-10 DIAGNOSIS — K529 Noninfective gastroenteritis and colitis, unspecified: Secondary | ICD-10-CM

## 2022-01-10 DIAGNOSIS — E7800 Pure hypercholesterolemia, unspecified: Secondary | ICD-10-CM

## 2022-01-10 HISTORY — PX: ABDOMINAL AORTIC ENDOVASCULAR STENT GRAFT: SHX5707

## 2022-01-10 LAB — CBC
HCT: 37.4 % (ref 36.0–46.0)
Hemoglobin: 12.8 g/dL (ref 12.0–15.0)
MCH: 30.5 pg (ref 26.0–34.0)
MCHC: 34.2 g/dL (ref 30.0–36.0)
MCV: 89.3 fL (ref 80.0–100.0)
Platelets: 237 10*3/uL (ref 150–400)
RBC: 4.19 MIL/uL (ref 3.87–5.11)
RDW: 12.7 % (ref 11.5–15.5)
WBC: 8.8 10*3/uL (ref 4.0–10.5)
nRBC: 0 % (ref 0.0–0.2)

## 2022-01-10 LAB — BASIC METABOLIC PANEL
Anion gap: 9 (ref 5–15)
BUN: 36 mg/dL — ABNORMAL HIGH (ref 8–23)
CO2: 23 mmol/L (ref 22–32)
Calcium: 8.8 mg/dL — ABNORMAL LOW (ref 8.9–10.3)
Chloride: 103 mmol/L (ref 98–111)
Creatinine, Ser: 1.65 mg/dL — ABNORMAL HIGH (ref 0.44–1.00)
GFR, Estimated: 32 mL/min — ABNORMAL LOW (ref >60)
Glucose, Bld: 101 mg/dL — ABNORMAL HIGH (ref 70–99)
Potassium: 3 mmol/L — ABNORMAL LOW (ref 3.5–5.1)
Sodium: 135 mmol/L (ref 135–145)

## 2022-01-10 LAB — TYPE AND SCREEN
ABO/RH(D): A POS
Antibody Screen: NEGATIVE

## 2022-01-10 SURGERY — INSERTION, ENDOVASCULAR STENT GRAFT, AORTA, ABDOMINAL
Anesthesia: General | Site: Groin

## 2022-01-10 MED ORDER — FENTANYL CITRATE (PF) 250 MCG/5ML IJ SOLN
INTRAMUSCULAR | Status: AC
  Filled 2022-01-10: qty 5

## 2022-01-10 MED ORDER — PHENYLEPHRINE 40 MCG/ML (10ML) SYRINGE FOR IV PUSH (FOR BLOOD PRESSURE SUPPORT)
PREFILLED_SYRINGE | INTRAVENOUS | Status: AC
  Filled 2022-01-10: qty 10

## 2022-01-10 MED ORDER — HEPARIN SODIUM (PORCINE) 1000 UNIT/ML IJ SOLN
INTRAMUSCULAR | Status: DC | PRN
  Administered 2022-01-10: 2000 [IU] via INTRAVENOUS
  Administered 2022-01-10: 7000 [IU] via INTRAVENOUS

## 2022-01-10 MED ORDER — DEXAMETHASONE SODIUM PHOSPHATE 10 MG/ML IJ SOLN
INTRAMUSCULAR | Status: DC | PRN
  Administered 2022-01-10: 5 mg via INTRAVENOUS

## 2022-01-10 MED ORDER — HEPARIN 6000 UNIT IRRIGATION SOLUTION
Status: AC
  Filled 2022-01-10: qty 500

## 2022-01-10 MED ORDER — PROPOFOL 10 MG/ML IV BOLUS
INTRAVENOUS | Status: DC | PRN
  Administered 2022-01-10: 100 mg via INTRAVENOUS

## 2022-01-10 MED ORDER — PROPOFOL 10 MG/ML IV BOLUS
INTRAVENOUS | Status: AC
  Filled 2022-01-10: qty 20

## 2022-01-10 MED ORDER — FENTANYL CITRATE (PF) 100 MCG/2ML IJ SOLN
INTRAMUSCULAR | Status: AC
  Filled 2022-01-10: qty 2

## 2022-01-10 MED ORDER — LIDOCAINE 2% (20 MG/ML) 5 ML SYRINGE
INTRAMUSCULAR | Status: AC
  Filled 2022-01-10: qty 5

## 2022-01-10 MED ORDER — ONDANSETRON HCL 4 MG/2ML IJ SOLN
INTRAMUSCULAR | Status: DC | PRN
  Administered 2022-01-10: 4 mg via INTRAVENOUS

## 2022-01-10 MED ORDER — FENTANYL CITRATE (PF) 250 MCG/5ML IJ SOLN
INTRAMUSCULAR | Status: DC | PRN
  Administered 2022-01-10 (×3): 50 ug via INTRAVENOUS

## 2022-01-10 MED ORDER — IODIXANOL 320 MG/ML IV SOLN
INTRAVENOUS | Status: DC | PRN
  Administered 2022-01-10: 150 mL

## 2022-01-10 MED ORDER — FENTANYL CITRATE (PF) 100 MCG/2ML IJ SOLN
25.0000 ug | INTRAMUSCULAR | Status: AC | PRN
  Administered 2022-01-10 (×6): 25 ug via INTRAVENOUS

## 2022-01-10 MED ORDER — DEXAMETHASONE SODIUM PHOSPHATE 10 MG/ML IJ SOLN
INTRAMUSCULAR | Status: AC
  Filled 2022-01-10: qty 1

## 2022-01-10 MED ORDER — CEFAZOLIN SODIUM-DEXTROSE 2-3 GM-%(50ML) IV SOLR
INTRAVENOUS | Status: DC | PRN
  Administered 2022-01-10: 2 g via INTRAVENOUS

## 2022-01-10 MED ORDER — HEPARIN SODIUM (PORCINE) 1000 UNIT/ML IJ SOLN
INTRAMUSCULAR | Status: AC
  Filled 2022-01-10: qty 10

## 2022-01-10 MED ORDER — HEPARIN 6000 UNIT IRRIGATION SOLUTION
Status: DC | PRN
  Administered 2022-01-10: 1

## 2022-01-10 MED ORDER — PROTAMINE SULFATE 10 MG/ML IV SOLN
INTRAVENOUS | Status: DC | PRN
  Administered 2022-01-10: 50 mg via INTRAVENOUS

## 2022-01-10 MED ORDER — PHENYLEPHRINE HCL-NACL 20-0.9 MG/250ML-% IV SOLN
INTRAVENOUS | Status: DC | PRN
  Administered 2022-01-10: 40 ug/min via INTRAVENOUS

## 2022-01-10 MED ORDER — PHENYLEPHRINE 40 MCG/ML (10ML) SYRINGE FOR IV PUSH (FOR BLOOD PRESSURE SUPPORT)
PREFILLED_SYRINGE | INTRAVENOUS | Status: DC | PRN
Start: 2022-01-10 — End: 2022-01-10
  Administered 2022-01-10: 80 ug via INTRAVENOUS
  Administered 2022-01-10: 40 ug via INTRAVENOUS
  Administered 2022-01-10 (×2): 80 ug via INTRAVENOUS

## 2022-01-10 MED ORDER — ROCURONIUM BROMIDE 10 MG/ML (PF) SYRINGE
PREFILLED_SYRINGE | INTRAVENOUS | Status: DC | PRN
  Administered 2022-01-10: 20 mg via INTRAVENOUS
  Administered 2022-01-10: 60 mg via INTRAVENOUS

## 2022-01-10 MED ORDER — CEFAZOLIN SODIUM 1 G IJ SOLR
INTRAMUSCULAR | Status: AC
  Filled 2022-01-10: qty 20

## 2022-01-10 MED ORDER — ACETAMINOPHEN 10 MG/ML IV SOLN
INTRAVENOUS | Status: AC
  Filled 2022-01-10: qty 100

## 2022-01-10 MED ORDER — ROCURONIUM BROMIDE 10 MG/ML (PF) SYRINGE
PREFILLED_SYRINGE | INTRAVENOUS | Status: AC
  Filled 2022-01-10: qty 10

## 2022-01-10 MED ORDER — 0.9 % SODIUM CHLORIDE (POUR BTL) OPTIME
TOPICAL | Status: DC | PRN
  Administered 2022-01-10: 1000 mL

## 2022-01-10 MED ORDER — LIDOCAINE 2% (20 MG/ML) 5 ML SYRINGE
INTRAMUSCULAR | Status: DC | PRN
  Administered 2022-01-10: 60 mg via INTRAVENOUS

## 2022-01-10 MED ORDER — SUGAMMADEX SODIUM 200 MG/2ML IV SOLN
INTRAVENOUS | Status: DC | PRN
  Administered 2022-01-10: 200 mg via INTRAVENOUS

## 2022-01-10 MED ORDER — ACETAMINOPHEN 10 MG/ML IV SOLN
1000.0000 mg | Freq: Once | INTRAVENOUS | Status: DC | PRN
  Administered 2022-01-10: 1000 mg via INTRAVENOUS

## 2022-01-10 MED ORDER — POTASSIUM CHLORIDE CRYS ER 20 MEQ PO TBCR
40.0000 meq | EXTENDED_RELEASE_TABLET | Freq: Once | ORAL | Status: AC
  Administered 2022-01-10: 40 meq via ORAL
  Filled 2022-01-10: qty 2

## 2022-01-10 MED ORDER — LACTATED RINGERS IV SOLN: INTRAVENOUS | Status: DC | PRN

## 2022-01-10 MED ORDER — PROTAMINE SULFATE 10 MG/ML IV SOLN
INTRAVENOUS | Status: AC
  Filled 2022-01-10: qty 5

## 2022-01-10 SURGICAL SUPPLY — 57 items
ADH SKN CLS APL DERMABOND .7 (GAUZE/BANDAGES/DRESSINGS) ×2
BAG COUNTER SPONGE SURGICOUNT (BAG) ×2 IMPLANT
BAG SPNG CNTER NS LX DISP (BAG) ×1
CANISTER SUCT 3000ML PPV (MISCELLANEOUS) ×2 IMPLANT
CATH BEACON 5 .035 65 KMP TIP (CATHETERS) ×1 IMPLANT
CATH BEACON 5.038 65CM KMP-01 (CATHETERS) ×2 IMPLANT
CATH OMNI FLUSH .035X70CM (CATHETERS) ×2 IMPLANT
DERMABOND ADVANCED (GAUZE/BANDAGES/DRESSINGS) ×2
DERMABOND ADVANCED .7 DNX12 (GAUZE/BANDAGES/DRESSINGS) ×1 IMPLANT
DEVICE CLOSURE PERCLS PRGLD 6F (VASCULAR PRODUCTS) ×4 IMPLANT
DEVICE TORQUE KENDALL .025-038 (MISCELLANEOUS) ×1 IMPLANT
DRSG TEGADERM 2-3/8X2-3/4 SM (GAUZE/BANDAGES/DRESSINGS) ×4 IMPLANT
DRYSEAL FLEXSHEATH 12FR 33CM (SHEATH) ×1
DRYSEAL FLEXSHEATH 16FR 33CM (SHEATH) ×1
ELECT REM PT RETURN 9FT ADLT (ELECTROSURGICAL) ×4
ELECTRODE REM PT RTRN 9FT ADLT (ELECTROSURGICAL) ×2 IMPLANT
EXCLDR TRNK ENDO 23X14.5X12 15 (Endovascular Graft) ×2 IMPLANT
EXCLUDER TNK END 23X14.5X12 15 (Endovascular Graft) IMPLANT
EXTENDER ENDOPROSTHESIS 12X7 (Endovascular Graft) ×1 IMPLANT
GAUZE SPONGE 2X2 8PLY STRL LF (GAUZE/BANDAGES/DRESSINGS) ×1 IMPLANT
GLIDEWIRE ADV .035X180CM (WIRE) ×1 IMPLANT
GLOVE SRG 8 PF TXTR STRL LF DI (GLOVE) ×1 IMPLANT
GLOVE SURG ENC MOIS LTX SZ7.5 (GLOVE) ×2 IMPLANT
GLOVE SURG UNDER POLY LF SZ8 (GLOVE) ×2
GOWN STRL REUS W/ TWL LRG LVL3 (GOWN DISPOSABLE) ×3 IMPLANT
GOWN STRL REUS W/ TWL XL LVL3 (GOWN DISPOSABLE) ×1 IMPLANT
GOWN STRL REUS W/TWL LRG LVL3 (GOWN DISPOSABLE) ×6
GOWN STRL REUS W/TWL XL LVL3 (GOWN DISPOSABLE) ×2
GRAFT BALLN CATH 65CM (STENTS) ×1 IMPLANT
GUIDEWIRE ANGLED .035X150CM (WIRE) ×1 IMPLANT
KIT BASIN OR (CUSTOM PROCEDURE TRAY) ×2 IMPLANT
KIT DRAIN CSF ACCUDRAIN (MISCELLANEOUS) IMPLANT
KIT TURNOVER KIT B (KITS) ×2 IMPLANT
LEG CONTRALATERAL 16X12X12 (Vascular Products) ×1 IMPLANT
NS IRRIG 1000ML POUR BTL (IV SOLUTION) ×2 IMPLANT
PACK ENDOVASCULAR (PACKS) ×2 IMPLANT
PAD ARMBOARD 7.5X6 YLW CONV (MISCELLANEOUS) ×4 IMPLANT
PERCLOSE PROGLIDE 6F (VASCULAR PRODUCTS) ×8
SET MICROPUNCTURE 5F STIFF (MISCELLANEOUS) ×2 IMPLANT
SHEATH BRITE TIP 8FR 23CM (SHEATH) ×2 IMPLANT
SHEATH DRYSEAL FLEX 12FR 33CM (SHEATH) IMPLANT
SHEATH DRYSEAL FLEX 16FR 33CM (SHEATH) IMPLANT
SHEATH PINNACLE 8F 10CM (SHEATH) ×2 IMPLANT
SPONGE GAUZE 2X2 STER 10/PKG (GAUZE/BANDAGES/DRESSINGS) ×1
STENT GRAFT BALLN CATH 65CM (STENTS) ×4
STOPCOCK MORSE 400PSI 3WAY (MISCELLANEOUS) ×2 IMPLANT
SUT MNCRL AB 4-0 PS2 18 (SUTURE) ×4 IMPLANT
SUT PROLENE 5 0 C 1 24 (SUTURE) IMPLANT
SUT VIC AB 2-0 CTX 36 (SUTURE) IMPLANT
SUT VIC AB 3-0 SH 27 (SUTURE)
SUT VIC AB 3-0 SH 27X BRD (SUTURE) IMPLANT
SYR 20ML LL LF (SYRINGE) ×2 IMPLANT
TOWEL GREEN STERILE (TOWEL DISPOSABLE) ×2 IMPLANT
TRAY FOLEY MTR SLVR 16FR STAT (SET/KITS/TRAYS/PACK) ×2 IMPLANT
TUBING HIGH PRESSURE 120CM (CONNECTOR) ×2 IMPLANT
WIRE AMPLATZ SS-J .035X180CM (WIRE) ×4 IMPLANT
WIRE BENTSON .035X145CM (WIRE) ×4 IMPLANT

## 2022-01-10 NOTE — Assessment & Plan Note (Signed)
prolia q6 months ?

## 2022-01-10 NOTE — Assessment & Plan Note (Addendum)
Baseline creatinine 1.24 ?Presented with creatinine 1.9 ?Improved, 1.35 on day of discharge ?UA with RBC's, protein -> will need to be followed outpatient ?S/p right nephrectomy, L kidney without hydro ?

## 2022-01-10 NOTE — Op Note (Signed)
Date: January 10, 2022 ? ?Preoperative diagnosis: 4.9 cm symptomatic abdominal aortic aneurysm ? ?Postoperative diagnosis: Same ? ?Procedure: ?1.  Ultrasound-guided access of bilateral common femoral arteries for delivery of endograft with percutaneous closure (cpt 34742) ?2.  Endovascular repair of abdominal aortic aneurysm using aortobiiliac stent graft, GORE Excluder (cpt 59563) ? ?Surgeon: Dr. Marty Heck, MD ? ?Assistant: OR staff ? ?Indications: Patient is a 76 year old female who was seen in the ED yesterday by my partner Dr. Stanford Breed with a 4.9 cm abdominal aortic aneurysm that was felt to be symptomatic.  She presents today for stent graft repair after risk benefits discussed. ? ?Findings: Ultrasound-guided access bilateral common femoral arteries with delivery of Perclose devices at 10:00 and 2:00.  16 French Gore dry seal sheath was placed in the left common femoral artery and 12 French Gore dry seal sheath was placed in the right common femoral artery.  Main body device was taken up the left side and was a 23 mm x 14 mm x 12 cm and the left iliac extension was a 12 mm x 7 cm into the left common iliac with preservation of the hypogastric.  The right iliac extension was a 12 mm x 12 cm into the right common iliac with preservation of the right hypogastric.  The aneurysm neck was fairly angulated and only measured about 14 to 15 mm but we did get good seal by deploying this just below the left renal (only one kidney).  Type II endoleak at completion. ? ?Anesthesia: General ? ?Details: Patient was taken to the operating room after informed consent was obtained.  Placed on the operative table in supine position.  General endotracheal anesthesia was induced.  Ultimately the bilateral groins and abdominal wall were then prepped and draped in standard sterile fashion.  Antibiotics were given.  Timeout was performed.  Initially evaluated the right common femoral artery with ultrasound, it was patent, an  image was saved.  It was accessed with a micro access needle and placed a microwire.  I then made a stab incision with 11 blade scalpel and dissected down with hemostat placed a micro sheath and then a Bentson wire and then deployed a Perclose at 10:00 and 2:00 in the right common femoral artery after dilating with an 8 [place] dilator.  We then did the exact same steps in the left common femoral artery again evaluating this with ultrasound to ensure it was patent and using ultrasound to access the artery and then placing Perclose devices at 10:00 and 2:00 after putting a Bentson wire and an 8 [place] dilator.  I then used a KMP and exchanged for stiff Amplatz wires in both groins and then put a 12 [place] Gore dry seal sheath in the right common femoral artery and a 16 French Gore dry seal sheath in the left common femoral artery.  Patient was given 100 units/kg IV heparin.  We then put the main body device up the left side given a fairly angulated neck and this was a 23 mm x 14 mm x 12 cm GORE that was deployed just below the left renal which was the lowest and only renal artery.  Ultimately initial imaging showed that we had put the graft above the renal which was on purpose given we needed to get as much length given angulated neck so we encroached on the renal.  I went ahead and cannulated the gate given the main body of the graft was opened all the way down to  the gate coming from the right groin sheath with a buddy wire using a KMP and a Bentson.  Once we cannulated the gate, we spun a pigtail catheter in the main body of the graft and confirmed that we were indeed inside the graft.  I then put an Amplatz wire up.  At that point time we shot another aortogram and reconstrained the device and pulled it down slightly to just below the left renal and then reopened the device including using the angulation to ensure the graft hugged the angulated neck.  That point in time I finished deploying the rest of the main  body down the left side.  I then came from the right and got a retrograde image from the sheath identifying the hypogastric with a marker pigtail catheter.  I then deployed from the gate of the main body all the way down to the right common iliac just before the hypogastric with a 12 mm x 12 cm.  I then pulled the sheath back on the left groin identifying the left hypogastric and used a 12 mm x 7 cm extension on the left into the left common iliac while preserving the hypogastric.  My MOB balloon was then used to angioplasty the proximal and distal landing zones and all overlapping components.  Final aortogram showed good seal proximally and distally with what look like a type II endoleak.  We did shoot a second aortogram slightly different angulation just to confirm that there was no evidence of a type Ia.  It looked like excellent seal proximally and distally and this was a type II.  The graft was widely patent and both limbs filled with no stenosis.  Satisfied the results wires and catheters were removed.  Initially the right groin sheath was removed and we tied down her Perclose devices at 10:00 and 2:00 with good hemostasis and did the same in the left groin after removing the 16 French sheath and tied down those Perclose devices at 10:00 and 2:00 with good hemostasis..  Patient had good signals in both feet.  Protamine was given for reversal.  Both groins were closed with 4-0 Monocryl and Dermabond.  Taken to recovery in stable condition. ? ?Complication: None ? ?Condition: Stable ? ?Marty Heck, MD ?[facility] ?Office: [phone] ? ? ?Marty Heck ? ?                                                                              ?

## 2022-01-10 NOTE — Anesthesia Procedure Notes (Signed)
Procedure Name: Intubation ?Date/Time: 01/10/2022 7:53 AM ?Performed by: Amadeo Garnet, CRNA ?Pre-anesthesia Checklist: Patient identified, Emergency Drugs available, Suction available and Patient being monitored ?Patient Re-evaluated:Patient Re-evaluated prior to induction ?Oxygen Delivery Method: Circle system utilized ?Preoxygenation: Pre-oxygenation with 100% oxygen ?Induction Type: IV induction ?Ventilation: Mask ventilation without difficulty ?Laryngoscope Size: Mac and 4 ?Grade View: Grade I ?Tube type: Oral ?Tube size: 7.0 mm ?Number of attempts: 1 ?Airway Equipment and Method: Stylet and Oral airway ?Placement Confirmation: ETT inserted through vocal cords under direct vision, positive ETCO2 and breath sounds checked- equal and bilateral ?Secured at: 22 cm ?Tube secured with: Tape ?Dental Injury: Teeth and Oropharynx as per pre-operative assessment  ? ? ? ? ?

## 2022-01-10 NOTE — Assessment & Plan Note (Signed)
Follow outpatient  

## 2022-01-10 NOTE — Progress Notes (Signed)
Pt admitted to rm 7 from PACU. CHG wipe given. Initiated tele. Oriented pt to the unit. VSS. Call bell within reach.  ? ?Lavenia Atlas, RN ? ?

## 2022-01-10 NOTE — Hospital Course (Addendum)
Katie [name] is Katie [name] 76 y.o. female with medical history significant of AAA, COPD, HTN, CKD stage IIIa, mitral regurgitation status post clipping 2020, renal cell carcinoma status post nephrectomy, osteoporosis, presented with persistent symptoms of nauseous vomiting diarrhea and abdominal pain.  She was noted to have an enlarging AAA and is now s/p repair by vascular. ? ?Stable for discharge on 3/25 per vascular ? ?See below for additional details ?

## 2022-01-10 NOTE — Assessment & Plan Note (Addendum)
Metoprolol ?losartan ?

## 2022-01-10 NOTE — Assessment & Plan Note (Signed)
Noted on CT CAP, needs outpatient follow up ?

## 2022-01-10 NOTE — Assessment & Plan Note (Addendum)
Per cards, will pursue echo post op - as below, follow with cards outpatient ?

## 2022-01-10 NOTE — Anesthesia Postprocedure Evaluation (Signed)
Anesthesia Post Note ? ?Patient: Katie [name] ? ?Procedure(s) Performed: ABDOMINAL AORTIC ENDOVASCULAR STENT GRAFT (Groin) ? ?  ? ?Patient location during evaluation: PACU ?Anesthesia Type: General ?Level of consciousness: awake and alert ?Pain management: pain level controlled ?Vital Signs Assessment: post-procedure vital signs reviewed and stable ?Respiratory status: spontaneous breathing, nonlabored ventilation, respiratory function stable and patient connected to nasal cannula oxygen ?Cardiovascular status: blood pressure returned to baseline and stable ?Postop Assessment: no apparent nausea or vomiting ?Anesthetic complications: no ? ? ?No notable events documented. ? ?Last Vitals:  ?Vitals:  ? 01/10/22 1100 01/10/22 1115  ?BP: 129/73 130/77  ?Pulse: 77 73  ?Resp: 19 20  ?Temp:  36.6 ?C  ?SpO2: 96% 96%  ?  ?Last Pain:  ?Vitals:  ? 01/10/22 1115  ?TempSrc:   ?PainSc: 5   ? ? ?  ?  ?  ?  ?  ?  ? ?Shakia Sebastiano S ? ? ? ? ?

## 2022-01-10 NOTE — Anesthesia Procedure Notes (Signed)
Anesthesia Procedure Image    

## 2022-01-10 NOTE — Anesthesia Procedure Notes (Signed)
Arterial Line Insertion ?Start/End3/24/2023 7:15 AM, 01/10/2022 7:29 AM ?Performed by: Myrtie Soman, MD ? Patient location: Pre-op. ?Preanesthetic checklist: patient identified, IV checked, site marked, risks and benefits discussed, surgical consent, monitors and equipment checked, pre-op evaluation, timeout performed and anesthesia consent ?Lidocaine 1% used for infiltration ?radial was placed ?Catheter size: 20 G ?Hand hygiene performed  and maximum sterile barriers used  ? ?Attempts: 3 ?Procedure performed using ultrasound guided technique. ?Ultrasound Notes:anatomy identified, needle tip was noted to be adjacent to the nerve/plexus identified and no ultrasound evidence of intravascular and/or intraneural injection ?Following insertion, dressing applied and Biopatch. ?Post procedure assessment: normal and unchanged ? ?Patient tolerated the procedure well with no immediate complications. ? ? ?

## 2022-01-10 NOTE — Transfer of Care (Signed)
Immediate Anesthesia Transfer of Care Note ? ?Patient: Katie [name] ? ?Procedure(s) Performed: ABDOMINAL AORTIC ENDOVASCULAR STENT GRAFT (Groin) ? ?Patient Location: PACU ? ?Anesthesia Type:General ? ?Level of Consciousness: awake, alert  and oriented ? ?Airway & Oxygen Therapy: Patient Spontanous Breathing and Patient connected to face mask oxygen ? ?Post-op Assessment: Report given to RN, Post -op Vital signs reviewed and stable and Patient moving all extremities ? ?Post vital signs: Reviewed and stable ? ?Last Vitals:  ?Vitals Value Taken Time  ?BP 151/82 01/10/22 1015  ?Temp    ?Pulse 67 01/10/22 1019  ?Resp 12 01/10/22 1019  ?SpO2 97 % 01/10/22 1019  ?Vitals shown include unvalidated device data. ? ?Last Pain:  ?Vitals:  ? 01/10/22 0300  ?TempSrc:   ?PainSc: 0-No pain  ?   ? ?  ? ?Complications: No notable events documented. ?

## 2022-01-10 NOTE — Assessment & Plan Note (Addendum)
CT with 4.9 cm infrarenal aortic aneurysm ?Now s/p US guided access of bilateral common femoral arteries for delivery of endograft with percutaneous closure and endovacular repair of abdominal aortic aneurysm using aortobiiliac stent graft 3/24 ?Post op care per vascular -> ok for discharge per vascular, follow outpatient ?

## 2022-01-10 NOTE — Assessment & Plan Note (Signed)
Seems improved ?

## 2022-01-10 NOTE — Progress Notes (Signed)
[facility] ? ?Subjective  -no complaints this morning. ? ? ?Objective ?98/72 ?74 ?97.9 ?F (36.6 ?C) (Oral) ?19 ?97% ? ?Intake/Output Summary (Last 24 hours) at 01/10/2022 0730 ?Last data filed at 01/10/2022 0455 ?Gross per 24 hour  ?Intake 2165.99 ml  ?Output 0 ml  ?Net 2165.99 ml  ? ? ?Palpable femoral pulses bilaterally ?Palpable DP pulses bilaterally ? ?Laboratory ?Lab Results: ?Recent Labs  ?  01/09/22 ?0905 01/10/22 ?9407  ?WBC 8.6 8.8  ?HGB 15.2* 12.8  ?HCT 44.9 37.4  ?PLT 308 237  ? ?BMET ?Recent Labs  ?  01/09/22 ?0657 01/10/22 ?6808  ?NA 133* 135  ?K 5.0 3.0*  ?CL 95* 103  ?CO2 19* 23  ?GLUCOSE 124* 101*  ?BUN 36* 36*  ?CREATININE 1.90* 1.65*  ?CALCIUM 10.6* 8.8*  ? ? ?COAG ?Lab Results  ?Component Value Date  ? INR 3.2 (A) 05/17/2019  ? INR 2.0 05/05/2019  ? INR 3.7 (A) 04/29/2019  ? ?No results found for: PTT ? ?Assessment/Planning: ? ?76 year old female presents with possible 4.9 cm symptomatic abdominal aortic aneurysm.  Plan stent graft repair today.  Risk benefits discussed including risk of bleeding, infection, renal failure, vessel injury, possible conversion open etc. ? ?Marty Heck ?01/10/2022 ?7:30 AM ?-- ? ? ?

## 2022-01-10 NOTE — Progress Notes (Signed)
?PROGRESS NOTE ? ? ? ?Katie [name]  TML:[id number] DOB: 1946-04-11 DOA: 01/09/2022 ?PCP: Katie Nova, MD  ?Chief Complaint  ?Patient presents with  ? Abdominal Pain  ? ? ?Brief Narrative:  ?Katie [name] is Katie [name] 76 y.o. female with medical history significant of AAA, COPD, HTN, CKD stage IIIa, mitral regurgitation status post clipping 2020, renal cell carcinoma status post nephrectomy, osteoporosis, presented with persistent symptoms of nauseous vomiting diarrhea and abdominal pain.  She was noted to have an enlarging AAA and is now s/p repair by vascular.  ? ? ?Assessment & Plan: ?  ?Principal Problem: ?  Abdominal aortic aneurysm (AAA) 3.0 cm to 5.0 cm in diameter in female ?Active Problems: ?  AAA (abdominal aortic aneurysm) without rupture ?  Gastroenteritis ?  AKI (acute kidney injury) ([place]) ?  Essential hypertension ?  Holosystolic murmur ?  COPD ?  Osteoporosis ?  Thyroid nodule ?  Hepatic cyst ?  CKD stage G3a/A1, GFR 45-59 and albumin creatinine ratio <30 mg/g (HCC) ? ? ?Assessment and Plan: ?AAA (abdominal aortic aneurysm) without rupture ?CT with 4.9 cm infrarenal aortic aneurysm ?Now s/p US guided access of bilateral common femoral arteries for delivery of endograft with percutaneous closure and endovacular repair of abdominal aortic aneurysm using aortobiiliac stent graft 3/24 ?Post op care per vascular ? ?Gastroenteritis ?Seems improved ? ?AKI (acute kidney injury) ([place]) ?Baseline creatinine 1.24 ?Presented with creatinine 1.9 ?Improving today, follow (losartan was continued, will continue with improvement, may need to hold if not continuing to improve) ?UA with RBC's, protein -> will need to be followed  ?S/p right nephrectomy, L kidney without hydro ? ?Holosystolic murmur ?Per cards, will pursue echo post op ? ?Essential hypertension ?Metoprolol ?losartan ? ?COPD ?Noted, will follow ? ?Osteoporosis ?prolia q6 months ? ?Hepatic cyst ?Follow outpatient ? ?Thyroid nodule ?Noted on CT CAP, needs  outpatient follow up ? ? ?DVT prophylaxis: heparin ?Code Status: full ?Family Communication: none ?Disposition:  ? ?Status is: Inpatient ?Remains inpatient appropriate because: vascular following s/p AAA repair ?  ?Consultants:  ?Vascular ?cards ? ?Procedures:  ?Procedure: ?1.  Ultrasound-guided access of bilateral common femoral arteries for delivery of endograft with percutaneous closure (cpt 68127) ?2.  Endovascular repair of abdominal aortic aneurysm using aortobiiliac stent graft, GORE Excluder (cpt 51700) ? ?Antimicrobials:  ?Anti-infectives (From admission, onward)  ? ? None  ? ?  ? ? ?Subjective: ?Seen in pacu post up ?C/o post op pain ? ?Objective: ?Vitals:  ? 01/10/22 1515 01/10/22 1545 01/10/22 1615 01/10/22 1701  ?BP: 106/74 103/72 117/78 103/64  ?Pulse: 76 76 79 83  ?Resp: '13 15 18 20  '$ ?Temp:    98.5 ?F (36.9 ?C)  ?TempSrc:    Oral  ?SpO2: 100% 99% 98% 91%  ?Weight:      ?Height:      ? ? ?Intake/Output Summary (Last 24 hours) at 01/10/2022 1842 ?Last data filed at 01/10/2022 1615 ?Gross per 24 hour  ?Intake 3048.44 ml  ?Output 1100 ml  ?Net 1948.44 ml  ? ?Filed Weights  ? 01/09/22 0640  ?Weight: 66.2 kg  ? ? ?Examination: ? ?General exam: Appears calm and comfortable  ?Respiratory system: unlabored ?Cardiovascular system: RRR ?Gastrointestinal system: Abdomen is nondistended, groin incisions clean, dry, and intact ?Central nervous system: Alert and oriented. No focal neurological deficits. ?Extremities: no LEE ?Skin: No rashes, lesions or ulcers ?Psychiatry: Judgement and insight appear normal. Mood & affect appropriate.  ? ? ? ?Data Reviewed: I have personally reviewed following labs and  imaging studies ? ?CBC: ?Recent Labs  ?Lab 01/09/22 ?0905 01/10/22 ?6948  ?WBC 8.6 8.8  ?NEUTROABS 7.1  --   ?HGB 15.2* 12.8  ?HCT 44.9 37.4  ?MCV 89.1 89.3  ?PLT 308 237  ? ? ?Basic Metabolic Panel: ?Recent Labs  ?Lab 01/09/22 ?0657 01/10/22 ?5462  ?NA 133* 135  ?K 5.0 3.0*  ?CL 95* 103  ?CO2 19* 23  ?GLUCOSE 124* 101*   ?BUN 36* 36*  ?CREATININE 1.90* 1.65*  ?CALCIUM 10.6* 8.8*  ? ? ?GFR: ?Estimated Creatinine Clearance: 26.1 mL/min ([name]) (by C-G formula based on SCr of 1.65 mg/dL (H)). ? ?Liver Function Tests: ?Recent Labs  ?Lab 01/09/22 ?7035  ?AST 58*  ?ALT 35  ?ALKPHOS 83  ?BILITOT 2.0*  ?PROT 9.3*  ?ALBUMIN 4.9  ? ? ?CBG: ?No results for input(s): GLUCAP in the last 168 hours. ? ? ?Recent Results (from the past 240 hour(s))  ?MRSA Next Gen by PCR, Nasal     Status: None  ? Collection Time: 01/09/22  6:30 AM  ? Specimen: Nasal Mucosa; Nasal Swab  ?Result Value Ref Range Status  ? MRSA by PCR Next Gen NOT DETECTED NOT DETECTED Final  ?  Comment: (NOTE) ?The GeneXpert MRSA Assay (FDA approved for NASAL specimens only), ?is one component of [name] comprehensive MRSA colonization surveillance ?program. It is not intended to diagnose MRSA infection nor to guide ?or monitor treatment for MRSA infections. ?Test performance is not FDA approved in patients less than 2 years ?old. ?Performed at [facility], [place] [address], [place], [place] ?[address] ?  ?  ? ? ? ? ? ?Radiology Studies: ?CT ABDOMEN PELVIS WO CONTRAST ? ?Result Date: 01/09/2022 ?CLINICAL DATA:  Abdominal pain EXAM: CT ABDOMEN AND PELVIS WITHOUT CONTRAST TECHNIQUE: Multidetector CT imaging of the abdomen and pelvis was performed following the standard protocol without IV contrast. RADIATION DOSE REDUCTION: This exam was performed according to the departmental dose-optimization program which includes automated exposure control, adjustment of the mA and/or kV according to patient size and/or use of iterative reconstruction technique. COMPARISON:  CT done on 07/23/2021 FINDINGS: Lower chest: Small pleural plaque seen in the lateral aspect of right lower lung fields has not changed significantly. There is calcification in the pleura near the dome of right hemidiaphragm with no significant change. Dense calcifications are seen in the mitral annulus. Hepatobiliary: There are few  low-density lesions in the liver largest measuring 3.6 cm in size in the right lobe with no significant interval change. Surgical clips are seen in gallbladder fossa. Pancreas: No focal abnormality is seen. Spleen: Unremarkable. Adrenals/Urinary Tract: There is evidence of right nephrectomy. Left kidney shows no hydronephrosis. Mild nodularity in the left adrenal has not changed. There are no demonstrable renal or ureteral stones on the left side. Urinary bladder is unremarkable. Stomach/Bowel: Stomach is unremarkable. Small bowel loops are not dilated. Appendix is not seen. There is no significant wall thickening in colon. Scattered diverticula are seen in colon without signs of focal acute diverticulitis. Vascular/Lymphatic: There is aneurysmal dilation of abdominal aorta immediately below the origin of left renal artery measuring 4.9 x 4.8 cm. This aneurysm measured 4.7 cm in the previous study. There is no retroperitoneal hematoma. Reproductive: Unremarkable. Other: There is no ascites or pneumoperitoneum. Musculoskeletal: Compression fracture of body of L3 vertebra has not changed. IMPRESSION: There is no evidence of intestinal obstruction or pneumoperitoneum. There is no hydronephrosis. There is 4.8 x 4.9 cm aneurysm in the abdominal aorta immediately inferior to the  origin of left renal artery. There is no retroperitoneal hematoma. Diverticulosis of colon without signs of focal diverticulitis. Possible hepatic cysts. Status post right nephrectomy. Other findings as described in the body of the report. Electronically Signed   By: Elmer Picker M.D.   On: 01/09/2022 09:02  ? ?DG Chest Portable 1 View ? ?Result Date: 01/09/2022 ?CLINICAL DATA:  Abdominal pain, shortness of breath, nausea, vomiting common diarrhea for 3 days. History of AAA EXAM: PORTABLE CHEST 1 VIEW COMPARISON:  Chest radiograph 07/23/2021 CT chest 11/18/2021 FINDINGS: The cardiomediastinal silhouette is stable. There is no focal  consolidation or pulmonary edema. There is no pleural effusion or pneumothorax There is no acute osseous abnormality. IMPRESSION: No radiographic evidence of acute cardiopulmonary process. Electronically Signed

## 2022-01-10 NOTE — Anesthesia Preprocedure Evaluation (Addendum)
Anesthesia Evaluation  ?Patient identified by MRN, date of birth, ID band ?Patient awake ? ? ? ?Reviewed: ?Allergy & Precautions, NPO status , Patient's Chart, lab work & pertinent test results ? ?Airway ?Mallampati: II ? ?TM Distance: >3 FB ?Neck ROM: Full ? ? ? Dental ?no notable dental hx. ? ?  ?Pulmonary ?COPD, Current Smoker,  ?  ?Pulmonary exam normal ?breath sounds clear to auscultation ? ? ? ? ? ? Cardiovascular ?hypertension, + Peripheral Vascular Disease  ? ?Rhythm:Regular Rate:Normal ?+ Systolic murmurs ? ?  ?Neuro/Psych ?negative neurological ROS ? negative psych ROS  ? GI/Hepatic ?Neg liver ROS, GERD  ,  ?Endo/Other  ?negative endocrine ROS ? Renal/GU ?Renal InsufficiencyRenal disease  ?negative genitourinary ?  ?Musculoskeletal ?negative musculoskeletal ROS ?(+)  ? Abdominal ?  ?Peds ?negative pediatric ROS ?(+)  Hematology ?negative hematology ROS ?(+)   ?Anesthesia Other Findings ? ? Reproductive/Obstetrics ?negative OB ROS ? ?  ? ? ? ? ? ? ? ? ? ? ? ? ? ?  ?  ? ? ? ? ? ? ? ?Anesthesia Physical ?Anesthesia Plan ? ?ASA: 3 ? ?Anesthesia Plan: General  ? ?Post-op Pain Management: Minimal or no pain anticipated  ? ?Induction: Intravenous ? ?PONV Risk Score and Plan: 2 and Ondansetron, Dexamethasone and Treatment may vary due to age or medical condition ? ?Airway Management Planned: Oral ETT ? ?Additional Equipment: Arterial line ? ?Intra-op Plan:  ? ?Post-operative Plan: Extubation in OR ? ?Informed Consent: I have reviewed the patients History and Physical, chart, labs and discussed the procedure including the risks, benefits and alternatives for the proposed anesthesia with the patient or authorized representative who has indicated his/her understanding and acceptance.  ? ? ? ?Dental advisory given ? ?Plan Discussed with: CRNA and Surgeon ? ?Anesthesia Plan Comments:   ? ? ? ? ? ? ?Anesthesia Quick Evaluation ? ?

## 2022-01-10 NOTE — Assessment & Plan Note (Signed)
Noted, will follow.

## 2022-01-10 NOTE — Plan of Care (Signed)

## 2022-01-11 ENCOUNTER — Inpatient Hospital Stay (HOSPITAL_COMMUNITY): Payer: Medicare Other

## 2022-01-11 DIAGNOSIS — R011 Cardiac murmur, unspecified: Secondary | ICD-10-CM

## 2022-01-11 DIAGNOSIS — R8290 Unspecified abnormal findings in urine: Secondary | ICD-10-CM

## 2022-01-11 DIAGNOSIS — R931 Abnormal findings on diagnostic imaging of heart and coronary circulation: Secondary | ICD-10-CM

## 2022-01-11 LAB — CBC
HCT: 32.1 % — ABNORMAL LOW (ref 36.0–46.0)
Hemoglobin: 10.8 g/dL — ABNORMAL LOW (ref 12.0–15.0)
MCH: 30.6 pg (ref 26.0–34.0)
MCHC: 33.6 g/dL (ref 30.0–36.0)
MCV: 90.9 fL (ref 80.0–100.0)
Platelets: 209 10*3/uL (ref 150–400)
RBC: 3.53 MIL/uL — ABNORMAL LOW (ref 3.87–5.11)
RDW: 12.7 % (ref 11.5–15.5)
WBC: 10.7 10*3/uL — ABNORMAL HIGH (ref 4.0–10.5)
nRBC: 0 % (ref 0.0–0.2)

## 2022-01-11 LAB — ECHOCARDIOGRAM COMPLETE
Area-P 1/2: 2.37 cm2
Height: 61 in
MV VTI: 1.5 cm2
S' Lateral: 2.6 cm
Weight: 2543.23 oz

## 2022-01-11 LAB — BASIC METABOLIC PANEL
Anion gap: 5 (ref 5–15)
BUN: 28 mg/dL — ABNORMAL HIGH (ref 8–23)
CO2: 24 mmol/L (ref 22–32)
Calcium: 8.9 mg/dL (ref 8.9–10.3)
Chloride: 106 mmol/L (ref 98–111)
Creatinine, Ser: 1.35 mg/dL — ABNORMAL HIGH (ref 0.44–1.00)
GFR, Estimated: 41 mL/min — ABNORMAL LOW (ref >60)
Glucose, Bld: 100 mg/dL — ABNORMAL HIGH (ref 70–99)
Potassium: 4.2 mmol/L (ref 3.5–5.1)
Sodium: 135 mmol/L (ref 135–145)

## 2022-01-11 MED ORDER — OXYCODONE HCL 5 MG PO TABS
5.0000 mg | ORAL_TABLET | Freq: Four times a day (QID) | ORAL | 0 refills | Status: DC | PRN
Start: 2022-01-11 — End: 2022-01-17

## 2022-01-11 MED ORDER — ALBUTEROL SULFATE (2.5 MG/3ML) 0.083% IN NEBU
2.5000 mg | INHALATION_SOLUTION | Freq: Once | RESPIRATORY_TRACT | Status: AC
  Administered 2022-01-11: 2.5 mg via RESPIRATORY_TRACT
  Filled 2022-01-11: qty 3

## 2022-01-11 NOTE — Progress Notes (Signed)
? ? ?  Perioperative echo has been ordered, but remains pending to evaluate murmur. Appears to have had no immediate perioperative complications. Cardiology will follow-up once echo results are available. ? ?Pixie Casino, MD, [facility], FACP  ?[place]  ?Medical Director of the [facility] &  ?Cardiovascular [facility] Reduction Clinic ?Diplomate of the [employer] of Clinical Lipidology ?Attending Cardiologist  ?Direct Dial: [phone]  Fax: [phone]  ?Website:  [email or web address] ? ?

## 2022-01-11 NOTE — Assessment & Plan Note (Signed)
Follow up outpatient ?

## 2022-01-11 NOTE — Plan of Care (Signed)

## 2022-01-11 NOTE — Progress Notes (Addendum)
[facility] [place] ? ?Subjective  -  Doing well wants to go home. ? ? ?Objective ?130/87 ?66 ?98.7 ?F (37.1 ?C) (Oral) ?18 ?100% ? ?Intake/Output Summary (Last 24 hours) at 01/11/2022 0856 ?Last data filed at 01/11/2022 3300 ?Gross per 24 hour  ?Intake 2220 ml  ?Output 2025 ml  ?Net 195 ml  ? ? ?Abdomin soft.  Bilateral groins soft without hematoma ?Palpable DP pulses bilaterally ?Lungs non labored breathing ?Heart RRR ? ?Assessment/Planning: ?POD # 1 EVAR ? ?Good inflow with palpable pedal pulses B ?Abdomin soft, tolerating PO's without N/V ?CR trending down.  Urin OP > 1900, will D/C foley and Aline.  Ambulate.   ?OK for discharge from a vascular point of view F/U placed for VVS ? ? ? ?Roxy Horseman ?01/11/2022 ?8:56 AM ?-- ? ?VASCULAR STAFF ADDENDUM: ?I have independently interviewed and examined the patient. ?I agree with the above.  ?Pain-free this morning, groins soft, pulses in the feet. ?Okay for d/c from vascular perspective ? ?J. Melene Muller, MD ?[facility] ?Office Phone Number: [phone] ?01/11/2022 11:06 AM ? ? ? ?Laboratory ?Lab Results: ?Recent Labs  ?  01/10/22 ?5625 01/11/22 ?6389  ?WBC 8.8 10.7*  ?HGB 12.8 10.8*  ?HCT 37.4 32.1*  ?PLT 237 209  ? ?BMET ?Recent Labs  ?  01/10/22 ?3734 01/11/22 ?2876  ?NA 135 135  ?K 3.0* 4.2  ?CL 103 106  ?CO2 23 24  ?GLUCOSE 101* 100*  ?BUN 36* 28*  ?CREATININE 1.65* 1.35*  ?CALCIUM 8.8* 8.9  ? ? ?COAG ?Lab Results  ?Component Value Date  ? INR 3.2 (A) 05/17/2019  ? INR 2.0 05/05/2019  ? INR 3.7 (A) 04/29/2019  ? ?No results found for: PTT ? ? ? ?

## 2022-01-11 NOTE — Discharge Summary (Signed)
Physician Discharge Summary  ?Katie [name] SFK:[id number] DOB: 1945/12/16 DOA: 01/09/2022 ? ?PCP: Katie Nova, MD ? ?Admit date: 01/09/2022 ?Discharge date: 01/11/2022 ? ?Time spent: 40 minutes ? ?Recommendations for Outpatient Follow-up:  ?Follow outpatient CBC/CMP  ?Follow with vascular outpatient ?Follow echo with cards outpatient ?Follow hepatic cyst and thyroid nodule with pcp ?Abnormal UA at presentation, repeat outpatient with PCP (hematuria, proteinuria)  ? ?Discharge Diagnoses:  ?Principal Problem: ?  Abdominal aortic aneurysm (AAA) 3.0 cm to 5.0 cm in diameter in [name] ?Active Problems: ?  AAA (abdominal aortic aneurysm) without rupture ?  Gastroenteritis ?  AKI (acute kidney injury) ([place]) ?  Essential hypertension ?  Holosystolic murmur ?  COPD ?  Osteoporosis ?  Thyroid nodule ?  Hepatic cyst ?  Abnormal urinalysis ?  CKD stage G3a/A1, GFR 45-59 and albumin creatinine ratio <30 mg/g (HCC) ? ? ?Discharge Condition: stable ? ?Diet recommendation: heart healthy ? ?Filed Weights  ? 01/09/22 0640 01/11/22 0405  ?Weight: 66.2 kg 72.1 kg  ? ? ?History of present illness:  ?Katie [name] is Katie [name] 76 y.o. [name] with medical history significant of AAA, COPD, HTN, CKD stage IIIa, mitral regurgitation status post clipping 2020, renal cell carcinoma status post nephrectomy, osteoporosis, presented with persistent symptoms of nauseous vomiting diarrhea and abdominal pain.  She was noted to have an enlarging AAA and is now s/p repair by vascular. ? ?Stable for discharge on 3/25 per vascular ? ?See below for additional details ? ?Hospital Course:  ?Assessment and Plan: ?AAA (abdominal aortic aneurysm) without rupture ?CT with 4.9 cm infrarenal aortic aneurysm ?Now s/p US guided access of bilateral common femoral arteries for delivery of endograft with percutaneous closure and endovacular repair of abdominal aortic aneurysm using aortobiiliac stent graft 3/24 ?Post op care per vascular -> ok for discharge per  vascular, follow outpatient ? ?Gastroenteritis ?Seems improved ? ?AKI (acute kidney injury) ([place]) ?Baseline creatinine 1.24 ?Presented with creatinine 1.9 ?Improved, 1.35 on day of discharge ?UA with RBC's, protein -> will need to be followed outpatient ?S/p right nephrectomy, L kidney without hydro ? ?Holosystolic murmur ?Per cards, will pursue echo post op ? ?Essential hypertension ?Metoprolol ?losartan ? ?COPD ?Noted, will follow ? ?Osteoporosis ?prolia q6 months ? ?Hepatic cyst ?Follow outpatient ? ?Thyroid nodule ?Noted on CT CAP, needs outpatient follow up ? ?Abnormal urinalysis ?Follow up outpatient  ? ? ?Procedures: ?Procedure: ?1.  Ultrasound-guided access of bilateral common femoral arteries for delivery of endograft with percutaneous closure (cpt 17494) ?2.  Endovascular repair of abdominal aortic aneurysm using aortobiiliac stent graft, GORE Excluder (cpt 49675) ? ?Echo ?IMPRESSIONS  ? ? ? 1. Left ventricular ejection fraction, by estimation, is 65 to 70%. The  ?left ventricle has normal function. The left ventricle has no regional  ?wall motion abnormalities. Left ventricular diastolic parameters are  ?indeterminate. LVOT peak gradient 15 mmHg  ? 2. Right ventricular systolic function is mildly reduced. The right  ?ventricular size is normal. There is normal pulmonary artery systolic  ?pressure. The estimated right ventricular systolic pressure is 91.6 mmHg.  ? 3. Left atrial size was moderately dilated.  ? 4. Katie [name] small pericardial effusion is present.  ? 5. There is Katie [name] present in the mitral position  ?    Trivial mitral valve regurgitation. Moderate mitral stenosis. MG 34mHg  ?at 74 bpm, MVA 1.5 cm^2 by continuity equation  ? 6. The aortic valve was not well visualized. Aortic valve regurgitation  ?is not visualized. No aortic  stenosis is present.  ? 7. The inferior vena cava is normal in size with greater than 50%  ?respiratory variability, suggesting right atrial pressure of  3 mmHg.   ? ?Consultations: ?Vascular ?cardiology ? ?Discharge Exam: ?Vitals:  ? 01/11/22 0838 01/11/22 1849  ?BP: 130/87 (!) 150/85  ?Pulse: 66   ?Resp: 18 17  ?Temp: 98.7 ?F (37.1 ?C) 98.2 ?F (36.8 ?C)  ?SpO2: 100%   ? ?Eager for discharge ? ?General: No acute distress. ?Cardiovascular: RRR ?Lungs: unlabored ?Abdomen: Soft, nontender, nondistended  ?Surgical incisions well appearing, some bruising to groins bilaterally ?Neurological: Alert and oriented ?3. Moves all extremities ?4 . Cranial nerves II through XII grossly intact. ?Skin: Warm and dry. No rashes or lesions. ?Extremities: No clubbing or cyanosis. No edema ?Discharge Instructions ? ? ?Discharge Instructions   ? ? Call MD for:  difficulty breathing, headache or visual disturbances   Complete by: As directed ?  ? Call MD for:  extreme fatigue   Complete by: As directed ?  ? Call MD for:  hives   Complete by: As directed ?  ? Call MD for:  persistant dizziness or light-headedness   Complete by: As directed ?  ? Call MD for:  persistant nausea and vomiting   Complete by: As directed ?  ? Call MD for:  redness, tenderness, or signs of infection (pain, swelling, bleeding, redness, odor or green/yellow discharge around incision site)   Complete by: As directed ?  ? Call MD for:  redness, tenderness, or signs of infection (pain, swelling, redness, odor or green/yellow discharge around incision site)   Complete by: As directed ?  ? Call MD for:  severe or increased pain, loss or decreased feeling  in affected limb(s)   Complete by: As directed ?  ? Call MD for:  severe uncontrolled pain   Complete by: As directed ?  ? Call MD for:  temperature >100.4   Complete by: As directed ?  ? Call MD for:  temperature >100.5   Complete by: As directed ?  ? Diet - low sodium heart healthy   Complete by: As directed ?  ? Discharge instructions   Complete by: As directed ?  ? You were seen for aortic aneurysm repair. ? ?Follow up with vascular as recommended. ? ?We got an  echo (ultrasound of your heart) which shows mildly reduced right ventricular systolic function and Katie [name] small pericardial effusion.  You also have moderate mitral stenosis.  Please follow these results with your cardiologist and PCP outpatient. ? ?You had hepatic cysts which should be followed outpatient and Katie [name] 11 mm nodule of the thyroid that should be followed with your PCP. ? ?Return for new, recurrent, or worsening symptoms. ? ?Please ask your PCP to request records from this hospitalization so they know what was done and what the next steps will be.  ? Discharge wound care:   Complete by: As directed ?  ? Per vascular  ? Increase activity slowly   Complete by: As directed ?  ? Resume previous diet   Complete by: As directed ?  ? ?  ? ?Allergies as of 01/11/2022   ? ?   Reactions  ? Ace Inhibitors Other (See Comments)  ? Pseudoasthma/ renal failure   ? ?  ? ?  ?Medication List  ?  ? ?TAKE these medications   ? ?acetaminophen 500 MG tablet ?Commonly known as: TYLENOL ?Take 500 mg by mouth every 8 (eight) hours as needed for  mild pain. ?  ?albuterol 108 (90 Base) MCG/ACT inhaler ?Commonly known as: VENTOLIN HFA ?Inhale 2 puffs into the lungs every 6 (six) hours as needed for wheezing or shortness of breath. ?  ?aspirin EC 81 MG tablet ?Take 1 tablet (81 mg total) by mouth daily. ?  ?aspirin-sod bicarb-citric acid 325 MG Tbef tablet ?Commonly known as: ALKA-SELTZER ?Take 325 mg by mouth daily as needed. ?  ?losartan 50 MG tablet ?Commonly known as: COZAAR ?Take 50 mg by mouth daily. ?  ?meclizine 25 MG tablet ?Commonly known as: ANTIVERT ?Take 50 mg by mouth 4 (four) times daily as needed for dizziness or nausea. ?  ?metoprolol tartrate 25 MG tablet ?Commonly known as: LOPRESSOR ?Take 1 tablet (25 mg total) by mouth 2 (two) times daily. ?What changed: when to take this ?  ?OVER THE COUNTER MEDICATION ?Take 1 tablet by mouth at bedtime. Blue tablet. [employer] or [employer] sleeping aid ?  ?oxyCODONE 5 MG immediate release  tablet ?Commonly known as: Oxy IR/ROXICODONE ?Take 1 tablet (5 mg total) by mouth every 6 (six) hours as needed for moderate pain. ?  ?Prolia 60 MG/ML Sosy injection ?Generic drug: denosumab ?Inject 60 mg into

## 2022-01-11 NOTE — Discharge Instructions (Signed)
   [facility] [place]   Discharge Instructions  Endovascular Aortic Aneurysm Repair  Please refer to the following instructions for your post-procedure care. Your surgeon or Physician Assistant will discuss any changes with you.  Activity  You are encouraged to walk as much as you can. You can slowly return to normal activities but must avoid strenuous activity and heavy lifting until your doctor tells you it's OK. Avoid activities such as vacuuming or swinging a gold club. It is normal to feel tired for several weeks after your surgery. Do not drive until your doctor gives the OK and you are no longer taking prescription pain medications. It is also normal to have difficulty with sleep habits, eating, and bowel movements after surgery. These will go away with time.  Bathing/Showering  You may shower after you go home. If you have an incision, do not soak in a bathtub, hot tub, or swim until the incision heals completely.  Incision Care  Shower every day. Clean your incision with mild soap and water. Pat the area dry with a clean towel. You do not need a bandage unless otherwise instructed. Do not apply any ointments or creams to your incision. If you clothing is irritating, you may cover your incision with a dry gauze pad.  Diet  Resume your normal diet. There are no special food restrictions following this procedure. A low fat/low cholesterol diet is recommended for all patients with vascular disease. In order to heal from your surgery, it is CRITICAL to get adequate nutrition. Your body requires vitamins, minerals, and protein. Vegetables are the best source of vitamins and minerals. Vegetables also provide the perfect balance of protein. Processed food has little nutritional value, so try to avoid this.  Medications  Resume taking all of your medications unless your doctor or nurse practitioner tells you not to. If your incision is causing pain, you may take  over-the-counter pain relievers such as acetaminophen (Tylenol). If you were prescribed a stronger pain medication, please be aware these medications can cause nausea and constipation. Prevent nausea by taking the medication with a snack or meal. Avoid constipation by drinking plenty of fluids and eating foods with a high amount of fiber, such as fruits, vegetables, and grains. Do not take Tylenol if you are taking prescription pain medications.   Follow up  Our office will schedule a follow-up appointment with a C.T. scan 3-4 weeks after your surgery.  Please call us immediately for any of the following conditions  Severe or worsening pain in your legs or feet or in your abdomen back or chest. Increased pain, redness, drainage (pus) from your incision sit. Increased abdominal pain, bloating, nausea, vomiting or persistent diarrhea. Fever of 101 degrees or higher. Swelling in your leg (s),  Reduce your risk of vascular disease  Stop smoking. If you would like help call [facility] at 1-800-QUIT-NOW (1-800-784-8669) or [facility] at [phone]. Manage your cholesterol Maintain a desired weight Control your diabetes Keep your blood pressure down  If you have questions, please call the office at [phone].   

## 2022-01-11 NOTE — Progress Notes (Signed)
?  Echocardiogram ?2D Echocardiogram has been performed. ? ?Katie [name] ?01/11/2022, 2:51 PM ?

## 2022-01-11 NOTE — Assessment & Plan Note (Signed)
RVSF mildly reduced, small pericardial effusion, moderate mitral stenosis, needs outpatient follow up with cards ?

## 2022-01-13 ENCOUNTER — Encounter (HOSPITAL_COMMUNITY): Payer: Self-pay | Admitting: Vascular Surgery

## 2022-01-13 LAB — POCT ACTIVATED CLOTTING TIME: Activated Clotting Time: 251 seconds

## 2022-01-14 ENCOUNTER — Emergency Department (HOSPITAL_COMMUNITY): Payer: Medicare Other

## 2022-01-14 ENCOUNTER — Other Ambulatory Visit: Payer: Self-pay

## 2022-01-14 ENCOUNTER — Encounter (HOSPITAL_COMMUNITY): Payer: Self-pay | Admitting: Pharmacy Technician

## 2022-01-14 ENCOUNTER — Observation Stay (HOSPITAL_COMMUNITY)
Admission: EM | Admit: 2022-01-14 | Discharge: 2022-01-17 | Disposition: A | Discharge status: Home / Self Care | Payer: Medicare Other | Attending: Internal Medicine | Admitting: Internal Medicine

## 2022-01-14 DIAGNOSIS — Z79899 Other long term (current) drug therapy: Secondary | ICD-10-CM | POA: Diagnosis not present

## 2022-01-14 DIAGNOSIS — I130 Hypertensive heart and chronic kidney disease with heart failure and stage 1 through stage 4 chronic kidney disease, or unspecified chronic kidney disease: Secondary | ICD-10-CM | POA: Diagnosis not present

## 2022-01-14 DIAGNOSIS — J449 Chronic obstructive pulmonary disease, unspecified: Secondary | ICD-10-CM | POA: Insufficient documentation

## 2022-01-14 DIAGNOSIS — Z7982 Long term (current) use of aspirin: Secondary | ICD-10-CM | POA: Insufficient documentation

## 2022-01-14 DIAGNOSIS — I5032 Chronic diastolic (congestive) heart failure: Secondary | ICD-10-CM | POA: Insufficient documentation

## 2022-01-14 DIAGNOSIS — Z85528 Personal history of other malignant neoplasm of kidney: Secondary | ICD-10-CM | POA: Diagnosis not present

## 2022-01-14 DIAGNOSIS — K567 Ileus, unspecified: Secondary | ICD-10-CM | POA: Diagnosis not present

## 2022-01-14 DIAGNOSIS — N1831 Chronic kidney disease, stage 3a: Secondary | ICD-10-CM | POA: Diagnosis not present

## 2022-01-14 DIAGNOSIS — F17210 Nicotine dependence, cigarettes, uncomplicated: Secondary | ICD-10-CM | POA: Diagnosis not present

## 2022-01-14 DIAGNOSIS — R109 Unspecified abdominal pain: Secondary | ICD-10-CM | POA: Diagnosis present

## 2022-01-14 DIAGNOSIS — R1084 Generalized abdominal pain: Secondary | ICD-10-CM

## 2022-01-14 DIAGNOSIS — I160 Hypertensive urgency: Secondary | ICD-10-CM | POA: Insufficient documentation

## 2022-01-14 LAB — CBC
HCT: 36.9 % (ref 36.0–46.0)
Hemoglobin: 12 g/dL (ref 12.0–15.0)
MCH: 29.9 pg (ref 26.0–34.0)
MCHC: 32.5 g/dL (ref 30.0–36.0)
MCV: 92 fL (ref 80.0–100.0)
Platelets: 247 10*3/uL (ref 150–400)
RBC: 4.01 MIL/uL (ref 3.87–5.11)
RDW: 12.3 % (ref 11.5–15.5)
WBC: 10.7 10*3/uL — ABNORMAL HIGH (ref 4.0–10.5)
nRBC: 0 % (ref 0.0–0.2)

## 2022-01-14 LAB — LIPASE, BLOOD: Lipase: 40 U/L (ref 11–51)

## 2022-01-14 LAB — URINALYSIS, ROUTINE W REFLEX MICROSCOPIC
Bilirubin Urine: NEGATIVE
Glucose, UA: NEGATIVE mg/dL
Hgb urine dipstick: NEGATIVE
Ketones, ur: NEGATIVE mg/dL
Leukocytes,Ua: NEGATIVE
Nitrite: NEGATIVE
Protein, ur: NEGATIVE mg/dL
Specific Gravity, Urine: 1.018 (ref 1.005–1.030)
pH: 8 (ref 5.0–8.0)

## 2022-01-14 LAB — COMPREHENSIVE METABOLIC PANEL
ALT: 20 U/L (ref 0–44)
AST: 26 U/L (ref 15–41)
Albumin: 3.2 g/dL — ABNORMAL LOW (ref 3.5–5.0)
Alkaline Phosphatase: 57 U/L (ref 38–126)
Anion gap: 11 (ref 5–15)
BUN: 13 mg/dL (ref 8–23)
CO2: 23 mmol/L (ref 22–32)
Calcium: 9.5 mg/dL (ref 8.9–10.3)
Chloride: 103 mmol/L (ref 98–111)
Creatinine, Ser: 1.32 mg/dL — ABNORMAL HIGH (ref 0.44–1.00)
GFR, Estimated: 42 mL/min — ABNORMAL LOW (ref >60)
Glucose, Bld: 109 mg/dL — ABNORMAL HIGH (ref 70–99)
Potassium: 3.8 mmol/L (ref 3.5–5.1)
Sodium: 137 mmol/L (ref 135–145)
Total Bilirubin: 0.8 mg/dL (ref 0.3–1.2)
Total Protein: 6.8 g/dL (ref 6.5–8.1)

## 2022-01-14 MED ORDER — METOCLOPRAMIDE HCL 5 MG/ML IJ SOLN
10.0000 mg | Freq: Once | INTRAMUSCULAR | Status: AC
  Administered 2022-01-14: 10 mg via INTRAVENOUS
  Filled 2022-01-14: qty 2

## 2022-01-14 MED ORDER — ONDANSETRON 4 MG PO TBDP
4.0000 mg | ORAL_TABLET | Freq: Once | ORAL | Status: AC
  Administered 2022-01-14: 4 mg via ORAL
  Filled 2022-01-14: qty 1

## 2022-01-14 MED ORDER — METOPROLOL TARTRATE 5 MG/5ML IV SOLN
5.0000 mg | Freq: Once | INTRAVENOUS | Status: AC
  Administered 2022-01-14: 5 mg via INTRAVENOUS
  Filled 2022-01-14: qty 5

## 2022-01-14 MED ORDER — OXYCODONE HCL 5 MG PO TABS
5.0000 mg | ORAL_TABLET | Freq: Four times a day (QID) | ORAL | Status: DC | PRN
  Administered 2022-01-15 – 2022-01-16 (×3): 5 mg via ORAL
  Filled 2022-01-14 (×3): qty 1

## 2022-01-14 MED ORDER — LORAZEPAM 2 MG/ML IJ SOLN
0.5000 mg | Freq: Once | INTRAMUSCULAR | Status: AC
  Administered 2022-01-14: 0.5 mg via INTRAVENOUS
  Filled 2022-01-14: qty 1

## 2022-01-14 MED ORDER — UMECLIDINIUM BROMIDE 62.5 MCG/ACT IN AEPB
1.0000 | INHALATION_SPRAY | Freq: Every day | RESPIRATORY_TRACT | Status: DC
  Filled 2022-01-14 (×2): qty 7

## 2022-01-14 MED ORDER — METOCLOPRAMIDE HCL 5 MG/ML IJ SOLN
10.0000 mg | Freq: Four times a day (QID) | INTRAMUSCULAR | Status: DC | PRN
  Administered 2022-01-14: 10 mg via INTRAVENOUS
  Filled 2022-01-14: qty 2

## 2022-01-14 MED ORDER — LABETALOL HCL 5 MG/ML IV SOLN
20.0000 mg | Freq: Once | INTRAVENOUS | Status: AC
  Administered 2022-01-14: 20 mg via INTRAVENOUS
  Filled 2022-01-14: qty 4

## 2022-01-14 MED ORDER — ALBUTEROL SULFATE HFA 108 (90 BASE) MCG/ACT IN AERS: 2.0000 | INHALATION_SPRAY | Freq: Four times a day (QID) | RESPIRATORY_TRACT | Status: DC | PRN

## 2022-01-14 MED ORDER — HYDROMORPHONE HCL 1 MG/ML IJ SOLN
0.5000 mg | INTRAMUSCULAR | Status: DC | PRN
  Administered 2022-01-14 – 2022-01-15 (×3): 1 mg via INTRAVENOUS
  Filled 2022-01-14 (×3): qty 1

## 2022-01-14 MED ORDER — IOHEXOL 350 MG/ML SOLN
80.0000 mL | Freq: Once | INTRAVENOUS | Status: AC | PRN
  Administered 2022-01-14: 80 mL via INTRAVENOUS

## 2022-01-14 MED ORDER — HYDRALAZINE HCL 20 MG/ML IJ SOLN
5.0000 mg | Freq: Four times a day (QID) | INTRAMUSCULAR | Status: DC | PRN
  Administered 2022-01-14: 5 mg via INTRAVENOUS
  Filled 2022-01-14 (×2): qty 1

## 2022-01-14 MED ORDER — ALBUTEROL SULFATE (2.5 MG/3ML) 0.083% IN NEBU
2.5000 mg | INHALATION_SOLUTION | Freq: Four times a day (QID) | RESPIRATORY_TRACT | Status: DC | PRN
  Administered 2022-01-17: 2.5 mg via RESPIRATORY_TRACT
  Filled 2022-01-14: qty 3

## 2022-01-14 MED ORDER — ASPIRIN EFFERVESCENT 325 MG PO TBEF: 325.0000 mg | EFFERVESCENT_TABLET | Freq: Every day | ORAL | Status: DC | PRN

## 2022-01-14 MED ORDER — ASPIRIN EC 81 MG PO TBEC
81.0000 mg | DELAYED_RELEASE_TABLET | Freq: Every day | ORAL | Status: DC
  Administered 2022-01-14 – 2022-01-17 (×4): 81 mg via ORAL
  Filled 2022-01-14 (×4): qty 1

## 2022-01-14 MED ORDER — ONDANSETRON HCL 4 MG/2ML IJ SOLN
4.0000 mg | INTRAMUSCULAR | Status: AC
  Administered 2022-01-14: 4 mg via INTRAVENOUS
  Filled 2022-01-14: qty 2

## 2022-01-14 MED ORDER — ACETAMINOPHEN 500 MG PO TABS
500.0000 mg | ORAL_TABLET | Freq: Three times a day (TID) | ORAL | Status: DC | PRN
  Administered 2022-01-15 – 2022-01-16 (×3): 500 mg via ORAL
  Filled 2022-01-14 (×3): qty 1

## 2022-01-14 MED ORDER — SODIUM CHLORIDE 0.9 % IV SOLN: INTRAVENOUS | Status: AC

## 2022-01-14 MED ORDER — CARVEDILOL 3.125 MG PO TABS
3.1250 mg | ORAL_TABLET | Freq: Two times a day (BID) | ORAL | Status: DC
  Administered 2022-01-14 – 2022-01-15 (×2): 3.125 mg via ORAL
  Filled 2022-01-14 (×2): qty 1

## 2022-01-14 MED ORDER — HEPARIN SODIUM (PORCINE) 5000 UNIT/ML IJ SOLN
5000.0000 [IU] | Freq: Two times a day (BID) | INTRAMUSCULAR | Status: DC
  Administered 2022-01-15 – 2022-01-17 (×5): 5000 [IU] via SUBCUTANEOUS
  Filled 2022-01-14 (×5): qty 1

## 2022-01-14 MED ORDER — FENTANYL CITRATE PF 50 MCG/ML IJ SOSY
25.0000 ug | PREFILLED_SYRINGE | Freq: Once | INTRAMUSCULAR | Status: AC
  Administered 2022-01-14: 25 ug via INTRAVENOUS
  Filled 2022-01-14: qty 1

## 2022-01-14 MED ORDER — FLUTICASONE FUROATE-VILANTEROL 200-25 MCG/ACT IN AEPB
1.0000 | INHALATION_SPRAY | Freq: Every day | RESPIRATORY_TRACT | Status: DC
  Filled 2022-01-14: qty 28

## 2022-01-14 NOTE — H&P (Signed)
?History and Physical  ? ? ?Katie [name] QAS:[id number] DOB: June 19, 1946 DOA: 01/14/2022 ? ?PCP: Roselee Nova, MD (Confirm with patient/family/NH records and if not entered, this has to be entered at [facility] point of entry) ?Patient coming from: Home ? ?I have personally briefly reviewed patient's old medical records in [place] ? ?Chief Complaint: Belly hurts, nauseous vomiting. ? ?HPI: Katie [name] is a 76 y.o. female with medical history significant of AAA status post endovascular repair March 2023, HTN, CKD stage IIIa, COPD, osteoporosis, thyroid nodule, presented with new onset of abdominal pain and nauseous vomiting. ? ?Patient with admitted recently for gastroenteritis last week, symptoms involve cramping-like abdominal pain, nauseous vomiting and diarrhea.  Incidentally on same admission, it was found patient had significant AAA enlargement, decision was made to undergo intraventricular AAA repair.  And patient's gastroenteritis symptoms resolved during the admission and discharged home 3 days ago.  Initially patient was symptom-free, but this morning, patient woke up with severe cramping-like abdominal pain, periumbilical, episodic, associated with severe nauseous and frequent vomiting 5-6 times this morning, with stomach content nonbilious nonbloody.  No diarrhea.  No fever or chills. ? ?ED Course: Blood pressure significantly elevated, no hypoxia. ? ?Blood work showed normal WBC, kidney function baseline. ? ?CT angiogram showed no significant finding including no significant endoleak from the sent AAA repair.  Abdominal x-ray suspicious for ileus. ? ?Review of Systems: As per HPI otherwise 14 point review of systems negative.  ? ? ?Past Medical History:  ?Diagnosis Date  ? AAA (abdominal aortic aneurysm)   ? Arthritis   ? Cholelithiasis 2011  ? s/p cholescystectomy   ? Chronic diastolic congestive heart failure ([place])   ? COPD (chronic obstructive pulmonary disease) ([place])   ? Emphysema   ? GERD  (gastroesophageal reflux disease)   ? History of heartburn   ? History of renal cell carcinoma   ? Hypercholesteremia   ? Hypertension   ? Hypertension   ? Mitral regurgitation   ? Pneumonia 1980's  ? Renal cell carcinoma 01/2010  ? s/p right radical nephrectomy 01/2010,  [facility]  ? S/P minimally invasive mitral valve repair 04/20/2019  ? Complex valvuloplasty including triangular resection of flail segment of posterior leaflet, artificial Gore-tex neochord placement x6 and 30 mm Sorin Memo 4D ring annuloplasty via right mini thoracotomy approach  ? Shortness of breath   ? Tuberculosis   ? Tests positive for PPD. dad had h/o TB.  ? ? ?Past Surgical History:  ?Procedure Laterality Date  ? ABDOMINAL AORTIC ENDOVASCULAR STENT GRAFT N/A 01/10/2022  ? Procedure: ABDOMINAL AORTIC ENDOVASCULAR STENT GRAFT;  Surgeon: Marty Heck, MD;  Location: [place];  Service: Vascular;  Laterality: N/A;  ? APPENDECTOMY  1970's  ? BUBBLE STUDY  03/18/2019  ? Procedure: BUBBLE STUDY;  Surgeon: Fay Records, MD;  Location: [place];  Service: Cardiovascular;;  ? CARDIAC CATHETERIZATION  09/2011  ? CHOLECYSTECTOMY  01/2010  ? DIAGNOSTIC LAPAROSCOPY    ? KIDNEY SURGERY    ? LEFT AND RIGHT HEART CATHETERIZATION WITH CORONARY ANGIOGRAM N/A 09/30/2011  ? Procedure: LEFT AND RIGHT HEART CATHETERIZATION WITH CORONARY ANGIOGRAM;  Surgeon: Candee Furbish, MD;  Location: [facility] CATH LAB;  Service: Cardiovascular;  Laterality: N/A;  ? MITRAL VALVE REPAIR Right 04/20/2019  ? Procedure: MINIMALLY INVASIVE MITRAL VALVE REPAIR (MVR) USING MEMO 4D SIZE 30;  Surgeon: Rexene Alberts, MD;  Location: [place];  Service: Open Heart Surgery;  Laterality: Right;  ? MULTIPLE  EXTRACTIONS WITH ALVEOLOPLASTY  10/06/2011  ? Procedure: MULTIPLE EXTRACION WITH ALVEOLOPLASTY;  Surgeon: Lenn Cal, DDS;  Location: [place];  Service: Oral Surgery;  Laterality: N/A;  Multiple extraction of tooth #'s 1, 6, 8, 10, 11, 22, 23, 26, 27, 28, 29 with  alveoloplasty and Upper right buccal exostoses reductions.  ? NEPHRECTOMY RADICAL  01/2010  ? right   ? OVARIAN CYST REMOVAL  1970's  ? "went through belly button"  ? RIGHT/LEFT HEART CATH AND CORONARY ANGIOGRAPHY N/A 03/22/2019  ? Procedure: RIGHT/LEFT HEART CATH AND CORONARY ANGIOGRAPHY;  Surgeon: [name], MD;  Location: [place] CV LAB;  Service: Cardiovascular;  Laterality: N/A;  ? TEE WITHOUT CARDIOVERSION  10/01/2011  ? Procedure: TRANSESOPHAGEAL ECHOCARDIOGRAM (TEE);  Surgeon: [name];  Location: [place];  Service: Cardiovascular;  Laterality: N/A;  ? TEE WITHOUT CARDIOVERSION N/A 03/18/2019  ? Procedure: TRANSESOPHAGEAL ECHOCARDIOGRAM (TEE);  Surgeon: Fay Records, MD;  Location: [place];  Service: Cardiovascular;  Laterality: N/A;  ? TEE WITHOUT CARDIOVERSION N/A 04/20/2019  ? Procedure: TRANSESOPHAGEAL ECHOCARDIOGRAM (TEE);  Surgeon: Rexene Alberts, MD;  Location: [place];  Service: Open Heart Surgery;  Laterality: N/A;  ? TUBAL LIGATION  1970's  ? US ECHOCARDIOGRAPHY  09/2011  ? ? ? reports that she has been smoking cigarettes. She has a 53.00 pack-year smoking history. She has been exposed to tobacco smoke. She has never used smokeless tobacco. She reports current alcohol use. She reports that she does not currently use drugs after having used the following drugs: Cocaine. ? ?Allergies  ?Allergen Reactions  ? Ace Inhibitors Other (See Comments)  ?  Pseudoasthma/ renal failure   ? ? ?Family History  ?Problem Relation Age of Onset  ? COPD Mother   ?     DECEASED/SMOKED  ? Diabetes Brother   ? Diabetes Brother   ? ? ? ?Prior to Admission medications   ?Medication Sig Start Date End Date Taking? Authorizing Provider  ?acetaminophen (TYLENOL) 500 MG tablet Take 500 mg by mouth every 8 (eight) hours as needed for mild pain.   Yes [provider]  ?albuterol (VENTOLIN HFA) 108 (90 Base) MCG/ACT inhaler Inhale 2 puffs into the lungs every 6 (six) hours as needed for  wheezing or shortness of breath. 02/08/20  Yes Brunetta Jeans, PA-C  ?aspirin EC 81 MG tablet Take 1 tablet (81 mg total) by mouth daily. 09/29/18  Yes Bonnielee Haff, MD  ?aspirin-sod bicarb-citric acid (ALKA-SELTZER) 325 MG TBEF tablet Take 325 mg by mouth daily as needed.   Yes [provider]  ?losartan (COZAAR) 50 MG tablet Take 50 mg by mouth daily. 11/25/21  Yes [provider]  ?metoprolol tartrate (LOPRESSOR) 25 MG tablet Take 1 tablet (25 mg total) by mouth 2 (two) times daily. 04/23/21  Yes Kennyth Arnold, FNP  ?OVER THE COUNTER MEDICATION Take 1 tablet by mouth at bedtime. Blue tablet. [employer] or [facility] sleeping aid   Yes [provider]  ?oxyCODONE (OXY IR/ROXICODONE) 5 MG immediate release tablet Take 1 tablet (5 mg total) by mouth every 6 (six) hours as needed for moderate pain. 01/11/22  Yes Ulyses Amor, PA-C  ?TRELEGY ELLIPTA 200-62.5-25 MCG/INH AEPB Inhale 1 puff into the lungs daily. 07/17/21  Yes [provider]  ?[place]. Devices (PULSE OXIMETER FOR FINGER) MISC 1 Device by Does not apply route as needed. 03/11/19   Brunetta Jeans, PA-C  ?PROLIA 60 MG/ML SOSY injection Inject 60 mg into the skin  every 6 (six) months. 05/16/21   [provider]  ? ? ?Physical Exam: ?Vitals:  ? 01/14/22 1430 01/14/22 1445 01/14/22 1515 01/14/22 1545  ?BP: (!) 172/112 (!) 176/86 (!) 184/94 (!) 171/103  ?Pulse: 92 77 73 78  ?Resp: (!) 26 (!) 21 (!) 24 20  ?Temp:      ?TempSrc:      ?SpO2: 99% 98% 98% 98%  ? ? ?Constitutional: NAD, calm, comfortable ?Vitals:  ? 01/14/22 1430 01/14/22 1445 01/14/22 1515 01/14/22 1545  ?BP: (!) 172/112 (!) 176/86 (!) 184/94 (!) 171/103  ?Pulse: 92 77 73 78  ?Resp: (!) 26 (!) 21 (!) 24 20  ?Temp:      ?TempSrc:      ?SpO2: 99% 98% 98% 98%  ? ?Eyes: PERRL, lids and conjunctivae normal ?ENMT: Mucous membranes are dry. Posterior pharynx clear of any exudate or lesions.Normal dentition.  ?Neck: normal, supple, no masses, no  thyromegaly ?Respiratory: clear to auscultation bilaterally, no wheezing, no crackles. Normal respiratory effort. No accessory muscle use.  ?Cardiovascular: Regular rate and rhythm, no murmurs / rubs / gallops. No extremity ed

## 2022-01-14 NOTE — Consult Note (Addendum)
?Hospital Consult ? ?VASCULAR SURGERY ASSESSMENT & PLAN:  ? ?POD 4  S/P ENDOVASCULAR REPAIR OF ABDOMINAL AORTIC ANEURYSM: The etiology of her abdominal pain is not clear.  Her symptoms have improved.  I did review the CT of her abdomen pelvis which shows that her stent graft is in good position with no evidence of endoleak.  No other intra abdominal pathology was noted.  There were no complicating features of the repair which would put her at increased risk for bowel ischemia.  She tells me that the pain is similar to the pain she had preoperatively.  I do not think that her pain is related to her aneurysm repair.  If her pain persist she will need a more extensive work-up.  Vascular surgery will follow peripherally. ? ?Gae Gallop, MD ?1:09 PM ? ? ? ?Reason for Consult: Abdominal pain status post endovascular repair of AAA ?Requesting Physician: ER ?MRN #:  [id number] ? ?History of Present Illness: This is a 76 y.o. female who presents to the emergency department with severe abdominal pain over the past 24 hours.  She is status post endovascular repair of abdominal aortic aneurysm by Dr. Carlis Abbott on 01/10/2022.  Prior to the onset of her symptoms patient states she was feeling well and denied any abdominal pain.  Concurrent symptoms include nausea and vomiting and inability to keep any food or liquid down.  She denies any rest pain of bilateral lower extremities.  Work-up included CTA abdomen pelvis which demonstrates a successful exclusion of the AAA sac and no evidence of endoleak.  Patient states her symptoms feel the same as they did prior to surgery. ? ?Past Medical History:  ?Diagnosis Date  ? AAA (abdominal aortic aneurysm)   ? Arthritis   ? Cholelithiasis 2011  ? s/p cholescystectomy   ? Chronic diastolic congestive heart failure ([place])   ? COPD (chronic obstructive pulmonary disease) ([place])   ? Emphysema   ? GERD (gastroesophageal reflux disease)   ? History of heartburn   ? History of renal cell carcinoma   ?  Hypercholesteremia   ? Hypertension   ? Hypertension   ? Mitral regurgitation   ? Pneumonia 1980's  ? Renal cell carcinoma 01/2010  ? s/p right radical nephrectomy 01/2010,  [facility]  ? S/P minimally invasive mitral valve repair 04/20/2019  ? Complex valvuloplasty including triangular resection of flail segment of posterior leaflet, artificial Gore-tex neochord placement x6 and 30 mm Sorin Memo 4D ring annuloplasty via right mini thoracotomy approach  ? Shortness of breath   ? Tuberculosis   ? Tests positive for PPD. dad had h/o TB.  ? ? ?Past Surgical History:  ?Procedure Laterality Date  ? ABDOMINAL AORTIC ENDOVASCULAR STENT GRAFT N/A 01/10/2022  ? Procedure: ABDOMINAL AORTIC ENDOVASCULAR STENT GRAFT;  Surgeon: Marty Heck, MD;  Location: [place];  Service: Vascular;  Laterality: N/A;  ? APPENDECTOMY  1970's  ? BUBBLE STUDY  03/18/2019  ? Procedure: BUBBLE STUDY;  Surgeon: Fay Records, MD;  Location: [place];  Service: Cardiovascular;;  ? CARDIAC CATHETERIZATION  09/2011  ? CHOLECYSTECTOMY  01/2010  ? DIAGNOSTIC LAPAROSCOPY    ? KIDNEY SURGERY    ? LEFT AND RIGHT HEART CATHETERIZATION WITH CORONARY ANGIOGRAM N/A 09/30/2011  ? Procedure: LEFT AND RIGHT HEART CATHETERIZATION WITH CORONARY ANGIOGRAM;  Surgeon: Candee Furbish, MD;  Location[facility] CATH LAB;  Service: Cardiovascular;  Laterality: N/A;  ? MITRAL VALVE REPAIR Right 04/20/2019  ? Procedure: MINIMALLY INVASIVE MITRAL VALVE REPAIR (  MVR) USING MEMO 4D SIZE 30;  Surgeon: Rexene Alberts, MD;  Location: [place];  Service: Open Heart Surgery;  Laterality: Right;  ? MULTIPLE EXTRACTIONS WITH ALVEOLOPLASTY  10/06/2011  ? Procedure: MULTIPLE EXTRACION WITH ALVEOLOPLASTY;  Surgeon: Lenn Cal, DDS;  Location: [place];  Service: Oral Surgery;  Laterality: N/A;  Multiple extraction of tooth #'s 1, 6, 8, 10, 11, 22, 23, 26, 27, 28, 29 with alveoloplasty and Upper right buccal exostoses reductions.  ? NEPHRECTOMY RADICAL  01/2010  ? right   ?  OVARIAN CYST REMOVAL  1970's  ? "went through belly button"  ? RIGHT/LEFT HEART CATH AND CORONARY ANGIOGRAPHY N/A 03/22/2019  ? Procedure: RIGHT/LEFT HEART CATH AND CORONARY ANGIOGRAPHY;  Surgeon: [name], MD;  Location: [place] CV LAB;  Service: Cardiovascular;  Laterality: N/A;  ? TEE WITHOUT CARDIOVERSION  10/01/2011  ? Procedure: TRANSESOPHAGEAL ECHOCARDIOGRAM (TEE);  Surgeon: [name];  Location: [place];  Service: Cardiovascular;  Laterality: N/A;  ? TEE WITHOUT CARDIOVERSION N/A 03/18/2019  ? Procedure: TRANSESOPHAGEAL ECHOCARDIOGRAM (TEE);  Surgeon: Fay Records, MD;  Location: [place];  Service: Cardiovascular;  Laterality: N/A;  ? TEE WITHOUT CARDIOVERSION N/A 04/20/2019  ? Procedure: TRANSESOPHAGEAL ECHOCARDIOGRAM (TEE);  Surgeon: Rexene Alberts, MD;  Location: [place];  Service: Open Heart Surgery;  Laterality: N/A;  ? TUBAL LIGATION  1970's  ? US ECHOCARDIOGRAPHY  09/2011  ? ? ?Allergies  ?Allergen Reactions  ? Ace Inhibitors Other (See Comments)  ?  Pseudoasthma/ renal failure   ? ? ?Prior to Admission medications   ?Medication Sig Start Date End Date Taking? Authorizing Provider  ?acetaminophen (TYLENOL) 500 MG tablet Take 500 mg by mouth every 8 (eight) hours as needed for mild pain.    [provider]  ?albuterol (VENTOLIN HFA) 108 (90 Base) MCG/ACT inhaler Inhale 2 puffs into the lungs every 6 (six) hours as needed for wheezing or shortness of breath. 02/08/20   Brunetta Jeans, PA-C  ?aspirin EC 81 MG tablet Take 1 tablet (81 mg total) by mouth daily. 09/29/18   Bonnielee Haff, MD  ?aspirin-sod bicarb-citric acid (ALKA-SELTZER) 325 MG TBEF tablet Take 325 mg by mouth daily as needed.    [provider]  ?Fluticasone-Umeclidin-Vilant (TRELEGY ELLIPTA) 100-62.5-25 MCG/INH AEPB Inhale 1 puff into the lungs daily. Further refills will need to come from new primary provider ?Patient not taking: Reported on 01/09/2022 12/21/20   Kennyth Arnold, FNP   ?losartan (COZAAR) 50 MG tablet Take 50 mg by mouth daily. 11/25/21   [provider]  ?meclizine (ANTIVERT) 25 MG tablet Take 50 mg by mouth 4 (four) times daily as needed for dizziness or nausea.    [provider]  ?metoprolol tartrate (LOPRESSOR) 25 MG tablet Take 1 tablet (25 mg total) by mouth 2 (two) times daily. ?Patient taking differently: Take 25 mg by mouth daily. 04/23/21   Kennyth Arnold, FNP  ?Misc. Devices (PULSE OXIMETER FOR FINGER) MISC 1 Device by Does not apply route as needed. 03/11/19   Brunetta Jeans, PA-C  ?OVER THE COUNTER MEDICATION Take 1 tablet by mouth at bedtime. Blue tablet. [employer] or [facility] sleeping aid    [provider]  ?oxyCODONE (OXY IR/ROXICODONE) 5 MG immediate release tablet Take 1 tablet (5 mg total) by mouth every 6 (six) hours as needed for moderate pain. 01/11/22   Ulyses Amor, PA-C  ?PROLIA 60 MG/ML SOSY injection Inject 60 mg into the skin every 6 (six)  months. 05/16/21   [provider]  ?Donnal Debar 200-62.5-25 MCG/INH AEPB Inhale 1 puff into the lungs daily. 07/17/21   [provider]  ? ? ?Social History  ? ?Socioeconomic History  ? Marital status: Married  ?  Spouse name: Not on file  ? Number of children: 3  ? Years of education: 66  ? Highest education level: Not on file  ?Occupational History  ? Occupation: Freight forwarder  ?  Employer: [employer]  ?  Comment: manages cleaning business  ?Tobacco Use  ? Smoking status: Some Days  ?  Packs/day: 1.00  ?  Years: 53.00  ?  Pack years: 53.00  ?  Types: Cigarettes  ?  Last attempt to quit: 03/18/2019  ?  Years since quitting: 2.8  ?  Passive exposure: Current (Son who lives with her Smokes)  ? Smokeless tobacco: Never  ?Vaping Use  ? Vaping Use: Never used  ?Substance and Sexual Activity  ? Alcohol use: Yes  ?  Alcohol/week: 0.0 standard drinks  ?  Comment:  "drink very rarely"  ? Drug use: Not Currently  ?  Types: Cocaine  ?  Comment: hx of cocaine last in 2006   ? Sexual activity: Never  ?Other Topics Concern  ? Not on file  ?Social History Narrative  ?   ?    ? Lives in [place] with her husband.   ? 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

## 2022-01-14 NOTE — ED Notes (Signed)
Pt assisted to bed commode, pt able to get out of bed and back into bed with standby assist.  ? ?

## 2022-01-14 NOTE — ED Triage Notes (Signed)
Pt. Was actively vomiting in triage. ?

## 2022-01-14 NOTE — ED Notes (Addendum)
Daughter Lalla Brothers called and updates on pt status and plan of care.  ?

## 2022-01-14 NOTE — ED Notes (Signed)
Pt provided a cup of ice per MD  ? ?

## 2022-01-14 NOTE — ED Triage Notes (Signed)
Need new CBC not enough blood. Redraw. ?

## 2022-01-14 NOTE — ED Triage Notes (Signed)
Pt here with reports of R sided abdominal pain with nausea onset yesterday. Pt with hx AAA with recent procedure to same. Pt states pain feels the same as when she was seen for that last week.  ?

## 2022-01-14 NOTE — ED Notes (Signed)
Dr. Nevada Crane notified of hypotensive trend that is notably different from previous BP's. Pt is not pale or diaphoretic and is mentating well.   She is reaching out to vascular surgery to determine their thoughts.  ?

## 2022-01-14 NOTE — ED Provider Notes (Signed)
?[facility] ?Provider Note ? ? ?CSN: [id number] ?Arrival date & time: 01/14/22  4854 ? ?  ? ?History ? ?Chief Complaint  ?Patient presents with  ? Abdominal Pain  ? ? ?Katie [name] is a 76 y.o. female. ? ?HPI ?Patient presents from home with 4 days after abdominal aortic aneurysm repair now with concern for abdominal pain that is" the same" as that she was experiencing prior to the repair.  She also has nausea, vomiting, no weakness in her extremities.  No relief with anything, though she notes she has been unable to take medication, including her antihypertensive secondary to pain. ?  ? ?Home Medications ?Prior to Admission medications   ?Medication Sig Start Date End Date Taking? Authorizing Provider  ?acetaminophen (TYLENOL) 500 MG tablet Take 500 mg by mouth every 8 (eight) hours as needed for mild pain.    [provider]  ?albuterol (VENTOLIN HFA) 108 (90 Base) MCG/ACT inhaler Inhale 2 puffs into the lungs every 6 (six) hours as needed for wheezing or shortness of breath. 02/08/20   Brunetta Jeans, PA-C  ?aspirin EC 81 MG tablet Take 1 tablet (81 mg total) by mouth daily. 09/29/18   Bonnielee Haff, MD  ?aspirin-sod bicarb-citric acid (ALKA-SELTZER) 325 MG TBEF tablet Take 325 mg by mouth daily as needed.    [provider]  ?Fluticasone-Umeclidin-Vilant (TRELEGY ELLIPTA) 100-62.5-25 MCG/INH AEPB Inhale 1 puff into the lungs daily. Further refills will need to come from new primary provider ?Patient not taking: Reported on 01/09/2022 12/21/20   Kennyth Arnold, FNP  ?losartan (COZAAR) 50 MG tablet Take 50 mg by mouth daily. 11/25/21   [provider]  ?meclizine (ANTIVERT) 25 MG tablet Take 50 mg by mouth 4 (four) times daily as needed for dizziness or nausea.    [provider]  ?metoprolol tartrate (LOPRESSOR) 25 MG tablet Take 1 tablet (25 mg total) by mouth 2 (two) times daily. ?Patient taking differently: Take 25 mg by mouth daily.  04/23/21   Kennyth Arnold, FNP  ?Misc. Devices (PULSE OXIMETER FOR FINGER) MISC 1 Device by Does not apply route as needed. 03/11/19   Brunetta Jeans, PA-C  ?OVER THE COUNTER MEDICATION Take 1 tablet by mouth at bedtime. Blue tablet. [employer] or [facility] sleeping aid    [provider]  ?oxyCODONE (OXY IR/ROXICODONE) 5 MG immediate release tablet Take 1 tablet (5 mg total) by mouth every 6 (six) hours as needed for moderate pain. 01/11/22   Ulyses Amor, PA-C  ?PROLIA 60 MG/ML SOSY injection Inject 60 mg into the skin every 6 (six) months. 05/16/21   [provider]  ?Donnal Debar 200-62.5-25 MCG/INH AEPB Inhale 1 puff into the lungs daily. 07/17/21   [provider]  ?   ? ?Allergies    ?Ace inhibitors   ? ?Review of Systems   ?Review of Systems  ?Constitutional:   ?     Per HPI, otherwise negative  ?HENT:    ?     Per HPI, otherwise negative  ?Respiratory:    ?     Per HPI, otherwise negative  ?Cardiovascular:   ?     Per HPI, otherwise negative  ?Gastrointestinal:  Positive for abdominal pain, nausea and vomiting.  ?Endocrine:  ?     Negative aside from HPI  ?Genitourinary:   ?     Neg aside from HPI   ?Musculoskeletal:   ?     Per HPI, otherwise negative  ?  Skin: Negative.   ?Neurological:  Negative for syncope.  ? ?Physical Exam ?Updated Vital Signs ?BP (!) 153/110   Pulse 73   Temp 98 ?F (36.7 ?C) (Oral)   Resp (!) 24   SpO2 100%  ?Physical Exam ?Vitals and nursing note reviewed.  ?Constitutional:   ?   General: She is not in acute distress. ?   Appearance: She is well-developed.  ?HENT:  ?   Head: Normocephalic and atraumatic.  ?Eyes:  ?   Conjunctiva/sclera: Conjunctivae normal.  ?Cardiovascular:  ?   Rate and Rhythm: Normal rate and regular rhythm.  ?Pulmonary:  ?   Effort: Pulmonary effort is normal. No respiratory distress.  ?   Breath sounds: Normal breath sounds. No stridor.  ?Abdominal:  ?   General: There is no distension.  ?   Tenderness: There is generalized  abdominal tenderness. There is guarding.  ?Skin: ?   General: Skin is warm and dry.  ?Neurological:  ?   Mental Status: She is alert and oriented to person, place, and time.  ?   Cranial Nerves: No cranial nerve deficit.  ?Psychiatric:     ?   Mood and Affect: Mood normal.  ? ? ?ED Results / Procedures / Treatments   ?Labs ?(all labs ordered are listed, but only abnormal results are displayed) ?Labs Reviewed  ?COMPREHENSIVE METABOLIC PANEL - Abnormal; Notable for the following components:  ?    Result Value  ? Glucose, Bld 109 (*)   ? Creatinine, Ser 1.32 (*)   ? Albumin 3.2 (*)   ? GFR, Estimated 42 (*)   ? All other components within normal limits  ?URINALYSIS, ROUTINE W REFLEX MICROSCOPIC - Abnormal; Notable for the following components:  ? APPearance HAZY (*)   ? All other components within normal limits  ?LIPASE, BLOOD  ?CBC  ? ? ?EKG ?EKG Interpretation ? ?Date/Time:  Tuesday January 14 2022 10:06:52 EDT ?Ventricular Rate:  69 ?PR Interval:  151 ?QRS Duration: 94 ?QT Interval:  405 ?QTC Calculation: 434 ?R Axis:   30 ?Text Interpretation: Sinus arrhythmia Consider left ventricular hypertrophy Artifact Abnormal ECG Confirmed by Carmin Muskrat [phone]) on 01/14/2022 10:16:04 AM ? ?Radiology ?No results found. ? ?Procedures ?Procedures  ? ? ?Medications Ordered in ED ?Medications  ?ondansetron (ZOFRAN-ODT) disintegrating tablet 4 mg (4 mg Oral Given 01/14/22 0853)  ?metoprolol tartrate (LOPRESSOR) injection 5 mg (5 mg Intravenous Given 01/14/22 1017)  ?LORazepam (ATIVAN) injection 0.5 mg (0.5 mg Intravenous Given 01/14/22 1018)  ? ? ?ED Course/ Medical Decision Making/ A&P ?This patient with a Hx of recent endovascular abdominal aortic aneurysm repair, hypertension presents to the ED for concern of abdominal pain, nausea, vomiting, this involves an extensive number of treatment options, and is a complaint that carries with it a high risk of complications and morbidity.   ? ?The differential diagnosis includes  complication of endovascular aneurysm repair, hypertensive crisis, bowel obstruction ? ? ?Social Determinants of Health: ? ?Age ? ?Additional history obtained: ? ?Additional history and/or information obtained from vascular surgery notes including op note, notable for procedure, seemingly without complication 4 days ago, endovascular aortobifem graft ? ? ?After the initial evaluation, orders, including: D, x-ray, labs, antihypertensives, antiemetics were initiated. ? ? ?Patient placed on Cardiac and Pulse-Oximetry Monitors. ?The patient was maintained on a cardiac monitor.  The cardiac monitored showed an rhythm of 75 sinus normal ?The patient was also maintained on pulse oximetry. The readings were typically 100% room air normal ? ? ?On repeat  evaluation of the patient improved ? ?Lab Tests: ? ?I personally interpreted labs.  The pertinent results include: Leukocytosis, creatinine 1.3 ? ?Imaging Studies ordered: ? ?I independently visualized and interpreted imaging which showed ileus versus early obstruction on x-ray, CT angiography with preserved stent flow, no evidence for complication ?I agree with the radiologist interpretation ? ?Consultations Obtained: ? ?I requested consultation with the vascular surgery, Dr. Scot Dock, and discussed lab and imaging findings as well as pertinent plan - they recommend: At bedside we discussed patient's presentation, recommendation for imaging, symptom control. ? ? ?Subsequently discussed the CT imaging results with Dr. Scot Dock again.  No indication for vascular surgery intervention currently. ? ?Dispostion / Final MDM: ? ?After consideration of the diagnostic results and the patient's response to treatment, patient will be admitted for nausea, vomiting, hypertensive urgency in the context of recent intravascular abdominal aortic repair, now suspicion for postop ileus, less likely complete obstruction, though with inability to take her medication.  No early evidence for  concurrent endorgan damage secondary to her hypertension, and blood pressure improved after IV medications provided here. ? ?Final Clinical Impression(s) / ED Diagnoses ?Final diagnoses:  ?Hypertensive urgency  ?Generalized abdo

## 2022-01-15 ENCOUNTER — Observation Stay (HOSPITAL_COMMUNITY): Payer: Medicare Other

## 2022-01-15 DIAGNOSIS — K567 Ileus, unspecified: Secondary | ICD-10-CM | POA: Diagnosis not present

## 2022-01-15 LAB — BASIC METABOLIC PANEL
Anion gap: 7 (ref 5–15)
BUN: 11 mg/dL (ref 8–23)
CO2: 26 mmol/L (ref 22–32)
Calcium: 8.4 mg/dL — ABNORMAL LOW (ref 8.9–10.3)
Chloride: 105 mmol/L (ref 98–111)
Creatinine, Ser: 1.28 mg/dL — ABNORMAL HIGH (ref 0.44–1.00)
GFR, Estimated: 44 mL/min — ABNORMAL LOW (ref >60)
Glucose, Bld: 96 mg/dL (ref 70–99)
Potassium: 3.6 mmol/L (ref 3.5–5.1)
Sodium: 138 mmol/L (ref 135–145)

## 2022-01-15 LAB — CBC
HCT: 32.1 % — ABNORMAL LOW (ref 36.0–46.0)
Hemoglobin: 10.4 g/dL — ABNORMAL LOW (ref 12.0–15.0)
MCH: 30.5 pg (ref 26.0–34.0)
MCHC: 32.4 g/dL (ref 30.0–36.0)
MCV: 94.1 fL (ref 80.0–100.0)
Platelets: 251 10*3/uL (ref 150–400)
RBC: 3.41 MIL/uL — ABNORMAL LOW (ref 3.87–5.11)
RDW: 12.3 % (ref 11.5–15.5)
WBC: 7.9 10*3/uL (ref 4.0–10.5)
nRBC: 0 % (ref 0.0–0.2)

## 2022-01-15 MED ORDER — METOPROLOL TARTRATE 25 MG PO TABS
25.0000 mg | ORAL_TABLET | Freq: Two times a day (BID) | ORAL | Status: DC
  Administered 2022-01-15 – 2022-01-17 (×4): 25 mg via ORAL
  Filled 2022-01-15 (×4): qty 1

## 2022-01-15 NOTE — Progress Notes (Signed)
?PROGRESS NOTE ? ?Katie [name]  ?DOB: 1946/05/08  ?PCP: Roselee Nova, MD ?UUV:[id number]  ?DOA: 01/14/2022 ? LOS: 0 days  ?Hospital Day: 2 ? ?Brief narrative: ?Katie [name] is a 76 y.o. female with PMH significant for HTN, HLD, AAA sp recent endovascular repair, chronic diastolic CHF, CKD 3 COPD, GERD, RCC status post prior right nephrectomy, complex mitral valvuloplasty. ?Patient presented to the ED with complaint of abdominal pain, nausea and vomiting. ?Patient was recently hospitalized 3/23 to 3/25 for gastroenteritis.  She was incidentally found to have a 4.9 cm infrarenal aortic aneurysm for which, she underwent endovascular repair of AAA by vascular surgery. ?Her gastroenteritis symptoms resolved and she was discharged to home. ?At home, she improved back to her usual state but woke up on the morning of 3/28 with severe cramping abdominal pain, severe nausea, and nonbilious vomiting. ? ?In the ED, she was afebrile, blood pressure was elevated over 200, not hypoxic ?Labs mostly unremarkable, kidney function at baseline ?CT angiogram showed no significant finding including no significant endoleak from the sent AAA repair.   ?Abdominal x-ray showed mildly prominent small bowel loops suggestive of ileus versus early obstruction. ? ?Subjective: ?Patient was seen and examined this morning.  Pleasant elderly female.  Lying on bed.  Feels better than at presentation.  No vomiting since yesterday.  Had a liquid bowel move this morning. ?She tolerated liquid diet and wants to try real food. ?Repeat x-ray this morning showed interval decrease in gaseous small bowel distention. ? ?Chart reviewed ?Blood pressure improved to normal range ?Labs this morning with creatinine stable ? ?Principal Problem: ?  Ileus ([place]) ?  ? ?Assessment and Plan: ?Small bowel obstruction versus ileus ?-Present with nausea vomiting, crampy abdominal pain ?-Initial abdominal x-ray with distended bowel loops ?-Patient was kept on  conservative management for bowel obstruction with n.p.o. status, IV fluid, ?-No vomiting since admission.  Having loose bowel movement. ?-Abdominal x-ray this morning shows interval improvement in the gaseous distention. ?-Patient probably had viral gastroenteritis.  No other evidence of bacterial infection.  Symptomatically improving.  Wants to try real food. ? ?Recent AAA repair ?-No acute issue, vascular surgeon examined the patient and reviewed CT scan ?-PTA, aspirin 81 mg daily ?  ?Hypertensive urgency ?-Blood pressure in the ED was elevated over 200. ?-Probably due to pain, stress and missing her meds on the morning of admission ?-Blood pressure gradually improved and is in low normal range this morning. ?-PTA, Lopressor 25 mg twice daily, losartan 50 mg daily. ?-Resume Lopressor.  Keep losartan on hold. ?-As needed IV hydralazine. ?  ?CKD stage IIIa ?-Creatinine at baseline, but urea level was elevated probably because of dehydration from vomiting.  Started on IV fluid.  BUN improved. ?Recent Labs  ?  06/07/21 ?1438 07/23/21 ?4742 07/23/21 ?5956 07/24/21 ?3875 01/09/22 ?6433 01/10/22 ?2951 01/11/22 ?8841 01/14/22 ?0800 01/15/22 ?6606  ?BUN '21 15 17 12 '$ 36* 36* 28* 13 11  ?CREATININE 1.46* 1.39* 1.30* 1.24* 1.90* 1.65* 1.35* 1.32* 1.28*  ?  ?COPD ?-Continue bronchodilators ?  ?Thyroid nodule ?-Incidental finding from last admission, outpatient follow-up with general surgery and endocrinology ? ?Goals of care ?  Code Status: Full Code  ? ? ?Mobility: Encourage ambulation ? ?Nutritional status:  ?Body mass index is 30.03 kg/m?.  ?  ?  ? ? ? ? ?Diet:  ?Diet Order   ? ?       ?  Diet regular Room service appropriate? Yes; Fluid consistency: Thin  Diet effective now       ?  ? ?  ?  ? ?  ? ? ?  DVT prophylaxis:  ?heparin injection 5,000 Units Start: 01/14/22 2200 ?  ?Antimicrobials: None ?Fluid: NS at 100 mill per hour ?Consultants: None ?Family Communication: None at bedside ? ?Status is: Observation ? ?Continue  in-hospital care because: Improving bowel obstruction.  Need to monitor ?Level of care: Telemetry Medical  ? ?Dispo: The patient is from: Home ?             Anticipated d/c is to: Hopefully home tomorrow if able to tolerate diet ?             Patient currently is not medically stable to d/c. ?  Difficult to place patient No ? ? ? ? ?Infusions:  ? sodium chloride 100 mL/hr at 01/15/22 1153  ? ? ?Scheduled Meds: ? aspirin EC  81 mg Oral Daily  ? fluticasone furoate-vilanterol  1 puff Inhalation Daily  ? And  ? umeclidinium bromide  1 puff Inhalation Daily  ? heparin  5,000 Units Subcutaneous Q12H  ? metoprolol tartrate  25 mg Oral BID  ? ? ?PRN meds: ?acetaminophen, albuterol, hydrALAZINE, HYDROmorphone (DILAUDID) injection, metoCLOPramide (REGLAN) injection, oxyCODONE  ? ?Antimicrobials: ?Anti-infectives (From admission, onward)  ? ? None  ? ?  ? ? ?Objective: ?Vitals:  ? 01/15/22 1030 01/15/22 1104  ?BP: 109/63 121/77  ?Pulse: 64 69  ?Resp: 20 20  ?Temp: 98.1 ?F (36.7 ?C) 98.2 ?F (36.8 ?C)  ?SpO2: 98% 99%  ? ?No intake or output data in the 24 hours ending 01/15/22 1403 ?Filed Weights  ? 01/15/22 1104  ?Weight: 72.1 kg  ? ?Weight change:  ?Body mass index is 30.03 kg/m?.  ? ?Physical Exam: ?General exam: Pleasant, elderly female.  Not in physical distress ?Skin: No rashes, lesions or ulcers. ?HEENT: Atraumatic, normocephalic, no obvious bleeding ?Lungs: Clear to auscultation bilaterally ?CVS: Regular rate and rhythm, no murmur ?GI/Abd soft, nontender, nondistended, bowel sound present ?CNS: Alert, awake, oriented x3 ?Psychiatry: Mood appropriate ?Extremities: No pedal edema, no calf tenderness ? ?Data Review: I have personally reviewed the laboratory data and studies available. ? ?F/u labs ordered ?Unresulted Labs (From admission, onward)  ? ?  Start     Ordered  ? 01/16/22 0500  CBC with Differential/Platelet  Tomorrow morning,   R       ? 01/15/22 1402  ? 01/16/22 1916  Basic metabolic panel  Tomorrow morning,   R        ? 01/15/22 1402  ? ?  ?  ? ?  ? ? ?Signed, ?Terrilee Croak, MD ?[facility] ?01/15/2022 ? ? ? ? ? ? ? ? ? ? ? ? ?

## 2022-01-15 NOTE — Progress Notes (Signed)
[facility] ? ?Subjective  - abdominal pain better today.  Tolerating liquids.  No n/v. ? ? ?Objective ?121/77 ?69 ?98.2 ?F (36.8 ?C) (Oral) ?20 ?99% ?No intake or output data in the 24 hours ending 01/15/22 1359 ? ?Bilateral groins c/d/I ?Bilateral DP pulses palpable ?Abdomen soft, no rebound or guarding ? ?Laboratory ?Lab Results: ?Recent Labs  ?  01/14/22 ?0900 01/15/22 ?7902  ?WBC 10.7* 7.9  ?HGB 12.0 10.4*  ?HCT 36.9 32.1*  ?PLT 247 251  ? ?BMET ?Recent Labs  ?  01/14/22 ?0800 01/15/22 ?4097  ?NA 137 138  ?K 3.8 3.6  ?CL 103 105  ?CO2 23 26  ?GLUCOSE 109* 96  ?BUN 13 11  ?CREATININE 1.32* 1.28*  ?CALCIUM 9.5 8.4*  ? ? ?COAG ?Lab Results  ?Component Value Date  ? INR 3.2 (A) 05/17/2019  ? INR 2.0 05/05/2019  ? INR 3.7 (A) 04/29/2019  ? ?No results found for: PTT ? ?Assessment/Planning: ? ?76 year old female status post endovascular stent graft of abdominal aortic aneurysm on Friday with concern for symptomatic 4.9 cm AAA.  Readmitted last night to the ED with abdominal pain.  CTA looks good with excellent seal of the endograft proximally distally and no endoleak.  I do not feel her abdominal pain is related to her aneurysm repair.  She had this pain prior to admission and likely other etiology.  Looks good from my standpoint.  We will ensure she has follow-up in one month in our office for surveillance with repeat scan. ? ?Marty Heck ?01/15/2022 ?1:59 PM ?-- ? ? ?

## 2022-01-15 NOTE — ED Notes (Signed)
Pt complaining of a 5/10 left sided headache. Pt given PRN tylenol dose.  ?

## 2022-01-15 NOTE — Progress Notes (Signed)
Mobility Specialist Progress Note: ? ? 01/15/22 1420  ?Mobility  ?Activity Ambulated with assistance in hallway  ?Level of Assistance Independent after set-up  ?Assistive Device None  ?Distance Ambulated (ft) 1120 ft  ?Activity Response Tolerated well  ?$Mobility charge 1 Mobility  ? ?Pt received in bed willing to participate in mobility. No complaints of pain. Pt left EOB with call bell in reach and all needs met.   ? ?Katie [name] ?Mobility Specialist ?Primary Phone [phone] ? ?

## 2022-01-16 ENCOUNTER — Encounter (HOSPITAL_COMMUNITY): Payer: Self-pay | Admitting: Internal Medicine

## 2022-01-16 DIAGNOSIS — K567 Ileus, unspecified: Secondary | ICD-10-CM | POA: Diagnosis not present

## 2022-01-16 LAB — BASIC METABOLIC PANEL
Anion gap: 6 (ref 5–15)
BUN: 10 mg/dL (ref 8–23)
CO2: 22 mmol/L (ref 22–32)
Calcium: 8.2 mg/dL — ABNORMAL LOW (ref 8.9–10.3)
Chloride: 111 mmol/L (ref 98–111)
Creatinine, Ser: 1.16 mg/dL — ABNORMAL HIGH (ref 0.44–1.00)
GFR, Estimated: 49 mL/min — ABNORMAL LOW (ref >60)
Glucose, Bld: 93 mg/dL (ref 70–99)
Potassium: 4.1 mmol/L (ref 3.5–5.1)
Sodium: 139 mmol/L (ref 135–145)

## 2022-01-16 LAB — CBC WITH DIFFERENTIAL/PLATELET
Abs Immature Granulocytes: 0.05 10*3/uL (ref 0.00–0.07)
Basophils Absolute: 0.1 10*3/uL (ref 0.0–0.1)
Basophils Relative: 1 %
Eosinophils Absolute: 0.2 10*3/uL (ref 0.0–0.5)
Eosinophils Relative: 2 %
HCT: 32 % — ABNORMAL LOW (ref 36.0–46.0)
Hemoglobin: 10.3 g/dL — ABNORMAL LOW (ref 12.0–15.0)
Immature Granulocytes: 1 %
Lymphocytes Relative: 20 %
Lymphs Abs: 1.6 10*3/uL (ref 0.7–4.0)
MCH: 30.4 pg (ref 26.0–34.0)
MCHC: 32.2 g/dL (ref 30.0–36.0)
MCV: 94.4 fL (ref 80.0–100.0)
Monocytes Absolute: 0.7 10*3/uL (ref 0.1–1.0)
Monocytes Relative: 9 %
Neutro Abs: 5.5 10*3/uL (ref 1.7–7.7)
Neutrophils Relative %: 67 %
Platelets: 261 10*3/uL (ref 150–400)
RBC: 3.39 MIL/uL — ABNORMAL LOW (ref 3.87–5.11)
RDW: 12.4 % (ref 11.5–15.5)
WBC: 8 10*3/uL (ref 4.0–10.5)
nRBC: 0 % (ref 0.0–0.2)

## 2022-01-16 MED ORDER — LOPERAMIDE HCL 2 MG PO CAPS
2.0000 mg | ORAL_CAPSULE | Freq: Two times a day (BID) | ORAL | Status: DC | PRN
  Administered 2022-01-16: 2 mg via ORAL
  Filled 2022-01-16: qty 1

## 2022-01-16 MED ORDER — MELATONIN 3 MG PO TABS
3.0000 mg | ORAL_TABLET | Freq: Once | ORAL | Status: AC
  Administered 2022-01-16: 3 mg via ORAL
  Filled 2022-01-16: qty 1

## 2022-01-16 MED ORDER — SODIUM CHLORIDE 0.9 % IV SOLN: INTRAVENOUS | Status: DC

## 2022-01-16 NOTE — Progress Notes (Signed)
?PROGRESS NOTE ? ?Katie [name]  ?DOB: [birth year]  ?PCP: Roselee Nova, MD ?KCL:[id number]  ?DOA: 01/14/2022 ? LOS: 0 days  ?Hospital Day: 3 ? ?Brief narrative: ?Katie [name] is a 76 y.o. female with PMH significant for HTN, HLD, AAA sp recent endovascular repair, chronic diastolic CHF, CKD 3 COPD, GERD, RCC status post prior right nephrectomy, complex mitral valvuloplasty. ?Patient presented to the ED with complaint of abdominal pain, nausea and vomiting. ?Patient was recently hospitalized 3/23 to 3/25 for gastroenteritis.  She was incidentally found to have a 4.9 cm infrarenal aortic aneurysm for which, she underwent endovascular repair of AAA by vascular surgery. ?Her gastroenteritis symptoms resolved and she was discharged to home. ?At home, she improved back to her usual state but woke up on the morning of 3/28 with severe cramping abdominal pain, severe nausea, and nonbilious vomiting. ? ?In the ED, she was afebrile, blood pressure was elevated over 200, not hypoxic ?Labs mostly unremarkable, kidney function at baseline ?CT angiogram showed no significant finding including no significant endoleak from the sent AAA repair.   ?Abdominal x-ray showed mildly prominent small bowel loops suggestive of ileus versus early obstruction. ? ?Subjective: ?Patient was seen and examined this morning.  Pleasant elderly female.  Lying on bed.   ?Patient states that since yesterday evening, patient started having diarrhea again.  Overnight she went up to 20 times. ?Vomiting has stopped.  No fever. ? ?Principal Problem: ?  Ileus ([place]) ?  ? ?Assessment and Plan: ?Acute gastroenteritis ?Small bowel obstruction versus ileus ?-Patient seems to be having some kind of of viral gastroenteritis versus food poisoning.  She states she ate tacos on Sunday night and started having nausea vomiting abdominal pain from Monday morning.   ?-Initial abdominal x-ray showed distended bowel loops ?-Patient was kept on conservative management  for bowel obstruction with n.p.o. status, IV fluid. ?-Repeat abdominal x-ray on 3/29 showed interval improvement in gaseous distention. ?-She stopped having vomiting but is now having multiple loose bowel movement since last night. ?-No fever, WBC count normal.  Does not look toxic.  No evidence of bacterial infection. ?-Continue IV hydration.  Start on Imodium twice daily as needed. ?-She is currently on regular diet and wants to continue the same. ?Recent Labs  ?Lab 01/10/22 ?4944 01/11/22 ?9675 01/14/22 ?0900 01/15/22 ?9163 01/16/22 ?0106  ?WBC 8.8 10.7* 10.7* 7.9 8.0  ? ?Recent AAA repair ?-No acute issue, vascular surgeon examined the patient and reviewed CT scan ?-PTA, aspirin 81 mg daily ?  ?Hypertensive urgency ?-Blood pressure in the ED was elevated over 200. ?-Probably due to pain, stress and missing her meds on the morning of admission ?-Blood pressure gradually improved. ?-PTA, Lopressor 25 mg twice daily, losartan 50 mg daily. ?-Resume Lopressor.  Continue to hold losartan.   ?-As needed IV hydralazine. ?  ?CKD stage IIIa ?-Creatinine at baseline, but urea level was elevated probably because of dehydration from vomiting.  Started on IV fluid.  BUN improved. ?Recent Labs  ?  06/07/21 ?1438 07/23/21 ?8466 07/23/21 ?5993 07/24/21 ?5701 01/09/22 ?7793 01/10/22 ?9030 01/11/22 ?0923 01/14/22 ?0800 01/15/22 ?3007 01/16/22 ?0106  ?BUN '21 15 17 12 '$ 36* 36* 28* '13 11 10  '$ ?CREATININE 1.46* 1.39* 1.30* 1.24* 1.90* 1.65* 1.35* 1.32* 1.28* 1.16*  ? ?  ?COPD ?-Continue bronchodilators ?  ?Thyroid nodule ?-Incidental finding from last admission, outpatient follow-up with general surgery and endocrinology ? ?Goals of care ?  Code Status: Full Code  ? ? ?Mobility: Encourage ambulation ? ?Nutritional  status:  ?Body mass index is 30.03 kg/m?.  ?  ?  ? ? ? ? ?Diet:  ?Diet Order   ? ?       ?  Diet regular Room service appropriate? Yes; Fluid consistency: Thin  Diet effective now       ?  ? ?  ?  ? ?  ? ? ?DVT prophylaxis:   ?heparin injection 5,000 Units Start: 01/14/22 2200 ?  ?Antimicrobials: None ?Fluid: NS at 75 mill per hour ?Consultants: None ?Family Communication: None at bedside ? ?Status is: Observation ? ?Continue in-hospital care because: Continues to have diarrhea. ?Level of care: Telemetry Medical  ? ?Dispo: The patient is from: Home ?             Anticipated d/c is to: Hopefully home in 1 to 2 days once diarrhea resolves ?             Patient currently is not medically stable to d/c. ?  Difficult to place patient No ? ? ? ? ?Infusions:  ? sodium chloride    ? ? ?Scheduled Meds: ? aspirin EC  81 mg Oral Daily  ? fluticasone furoate-vilanterol  1 puff Inhalation Daily  ? And  ? umeclidinium bromide  1 puff Inhalation Daily  ? heparin  5,000 Units Subcutaneous Q12H  ? metoprolol tartrate  25 mg Oral BID  ? ? ?PRN meds: ?acetaminophen, albuterol, hydrALAZINE, HYDROmorphone (DILAUDID) injection, loperamide, metoCLOPramide (REGLAN) injection, oxyCODONE  ? ?Antimicrobials: ?Anti-infectives (From admission, onward)  ? ? None  ? ?  ? ? ?Objective: ?Vitals:  ? 01/15/22 2342 01/16/22 0446  ?BP: (!) 142/69 118/75  ?Pulse: 74 64  ?Resp: 17 18  ?Temp: 98.1 ?F (36.7 ?C) 98.2 ?F (36.8 ?C)  ?SpO2: 94% 100%  ? ? ?Intake/Output Summary (Last 24 hours) at 01/16/2022 1153 ?Last data filed at 01/16/2022 0446 ?Gross per 24 hour  ?Intake 2720 ml  ?Output --  ?Net 2720 ml  ? ?Filed Weights  ? 01/15/22 1104  ?Weight: 72.1 kg  ? ?Weight change:  ?Body mass index is 30.03 kg/m?.  ? ?Physical Exam: ?General exam: Pleasant, elderly female.  Not in physical distress ?Skin: No rashes, lesions or ulcers. ?HEENT: Atraumatic, normocephalic, no obvious bleeding ?Lungs: Clear to auscultation bilaterally ?CVS: Regular rate and rhythm, no murmur ?GI/Abd soft, nontender, nondistended, bowel sound present ?CNS: Alert, awake, oriented x3 ?Psychiatry: Mood appropriate ?Extremities: No pedal edema, no calf tenderness ? ?Data Review: I have personally reviewed the  laboratory data and studies available. ? ?F/u labs ordered ?Unresulted Labs (From admission, onward)  ? ? None  ? ?  ? ? ?Signed, ?Terrilee Croak, MD ?[facility] ?01/16/2022 ? ? ? ? ? ? ? ? ? ? ? ? ?

## 2022-01-16 NOTE — Progress Notes (Signed)
Mobility Specialist: Progress Note ? ? 01/16/22 1209  ?Mobility  ?Activity Ambulated independently in hallway  ?Level of Assistance Independent  ?Assistive Device None  ?Distance Ambulated (ft) 800 ft  ?Activity Response Tolerated well  ?$Mobility charge 1 Mobility  ? ?Pt received in bed and agreeable to ambulation. C/o feeling dizzy upon sitting EOB, resolved quickly. No c/o throughout session. Pt back to bed with call bell at her side.  ? ?Katie [name] ?Mobility Specialist ?Mobility Specialist [place]: [phone] ?Mobility Specialist [place]: [phone] ? ?

## 2022-01-17 DIAGNOSIS — K567 Ileus, unspecified: Secondary | ICD-10-CM | POA: Diagnosis not present

## 2022-01-17 MED ORDER — POLYETHYLENE GLYCOL 3350 17 G PO PACK: 17.0000 g | PACK | Freq: Every day | ORAL | 0 refills | Status: DC

## 2022-01-17 MED ORDER — OXYCODONE HCL 5 MG PO TABS: 5.0000 mg | ORAL_TABLET | Freq: Four times a day (QID) | ORAL | 0 refills | Status: AC | PRN

## 2022-01-17 MED ORDER — LOPERAMIDE HCL 2 MG PO CAPS: 2.0000 mg | ORAL_CAPSULE | Freq: Two times a day (BID) | ORAL | 0 refills | Status: DC | PRN

## 2022-01-17 MED ORDER — SIMETHICONE 80 MG PO CHEW: 80.0000 mg | CHEWABLE_TABLET | Freq: Four times a day (QID) | ORAL | 0 refills | Status: DC | PRN

## 2022-01-17 NOTE — Progress Notes (Signed)
Pt ready for DC but wants to eat her bfast in her room first, on the way. DC instructions printed and reviewed with pt and copy given. ?

## 2022-01-17 NOTE — Discharge Summary (Addendum)
?Discharge Summary ? ?Katie [name] IBB:[id number] DOB: October 23, 1945 ? ?PCP: Roselee Nova, MD ? ?Admit date: 01/14/2022 ?Discharge date: 01/17/2022 ? ?Time spent: 35 minutes. ? ?Recommendations for Outpatient Follow-up:  ?Pulmonary vascular surgery in 1 to 2 weeks. ?Follow-up with your primary care provider within a week for blood pressure check. ?Take your medications as prescribed. ?Recommend GI referral from your primary care provider if acid reflux persists. ? ?Discharge Diagnoses:  ?Active Hospital Problems  ? Diagnosis Date Noted  ? Ileus ([place]) 01/14/2022  ?  ?Resolved Hospital Problems  ?No resolved problems to display.  ? ? ?Discharge Condition: Stable  ? ?Diet recommendation: Continue previous diet  ? ?Vitals:  ? 01/17/22 0535 01/17/22 0815  ?BP: 136/77 (!) 160/87  ?Pulse: (!) 57 66  ?Resp: 18 18  ?Temp: 98.2 ?F (36.8 ?C) 98.1 ?F (36.7 ?C)  ?SpO2: 97% 100%  ? ? ?History of present illness:  ?Katie [name] is a 76 y.o. female with PMH significant for HTN, HLD, AAA sp recent endovascular repair, chronic diastolic CHF, CKD 3 COPD, GERD, RCC status post prior right nephrectomy, complex mitral valvuloplasty. ?Patient presented to the ED with complaint of abdominal pain, nausea and vomiting. ?Patient was recently hospitalized 3/23 to 3/25 for gastroenteritis.  She was incidentally found to have a 4.9 cm infrarenal aortic aneurysm for which, she underwent endovascular repair of AAA by vascular surgery. ?Her gastroenteritis symptoms resolved and she was discharged to home. ?At home, she improved back to her usual state but woke up on the morning of 3/28 with severe cramping abdominal pain, severe nausea, and nonbilious vomiting. ?  ?In the ED, she was afebrile, blood pressure was elevated over 200, not hypoxic ?Labs mostly unremarkable, kidney function at baseline ?CT angiogram showed no significant finding including no significant endoleak from the sent AAA repair.   ?Abdominal x-ray showed mildly prominent  small bowel loops suggestive of ileus versus early obstruction. ? ?01/07/2022: Patient was seen and examined at bedside.  She states she feels a lot better.  She denies any nausea or abdominal pain at this time.  She is concerned that the abdominal pain will recur.  She has no new complaints and is eager to go home. ? ?Hospital Course:  ?Principal Problem: ?  Ileus ([place]) ? ?Resolved acute gastroenteritis ?Resolved small bowel obstruction versus ileus ?-Patient seems to be having some kind of of viral gastroenteritis versus food poisoning.  She states she ate tacos on Sunday night and started having nausea vomiting abdominal pain from Monday morning.   ?-Initial abdominal x-ray showed distended bowel loops ?-Patient was kept on conservative management for bowel obstruction with n.p.o. status, IV fluid. ?-Repeat abdominal x-ray on 3/29 showed interval improvement in gaseous distention. ?-She stopped having vomiting, she had bowel movements. ?Tolerating a diet.  No abdominal pain at the time of this visit.  She is concerned that the abdominal pain will recur and request for medication at discharge.  Oxycodone as needed for severe pain along with bowel regimen MiraLAX as needed for opiate-induced constipation. ? ?Recent AAA repair ?-No acute issue, vascular surgeon examined the patient and reviewed CT scan ?-PTA, aspirin 81 mg daily ?No acute findings, Dr. Carlis Abbott, vascular surgery, recommended outpatient follow-up with ?  ?Resolved hypertensive urgency ?-Blood pressure in the ED was elevated over 200. ?-Probably due to pain, stress and missing her meds on the morning of admission ?-Blood pressure gradually improved. ?-PTA, Lopressor 25 mg twice daily, losartan 50 mg daily. ?-Continue home Lopressor.  Continue  to hold losartan.   ?Follow-up with your primary care provider within a week. ?  ?CKD stage IIIa ?-Creatinine at baseline, but urea level was elevated probably because of dehydration from vomiting.  Started on IV  fluid.  BUN improved. ?Recent Labs (within last 365 days)  ?Creatinine 1.16 from 1.28, GFR 49. ?Continue to avoid nephrotoxic agents, dehydration and hypotension. ? ?COPD ?-Continue bronchodilators ?  ?Thyroid nodule ?-Incidental finding from last admission, outpatient follow-up with general surgery and endocrinology ?TSH 0.836 on 01/09/22 ?Follow-up with your primary care provider, obtain referral to endocrinology from your primary care provider. ? ?Excessive flatulence ?Simethicone as needed ? ? ?  ?Goals of care ?  Code Status: Full Code  ?  ?  ?Mobility: Encourage ambulation ?  ?Nutritional status:  ?Body mass index is 30.03 kg/m?.  ?  ? ? ? ?Procedures: ?None. ? ?Consultations: ?Vascular surgery.  ? ?Discharge Exam: ?BP (!) 160/87 (BP Location: Right Arm)   Pulse 66   Temp 98.1 ?F (36.7 ?C) (Oral)   Resp 18   Ht '5\' 1"'$  (1.549 m)   Wt 72.1 kg   SpO2 100%   BMI 30.03 kg/m?  ?General: 76 y.o. year-old female well developed well nourished in no acute distress.  Alert and oriented x3. ?Cardiovascular: Regular rate and rhythm with no rubs or gallops.  No thyromegaly or JVD noted.   ?Respiratory: Clear to auscultation with no wheezes or rales. Good inspiratory effort. ?Abdomen: Soft nontender nondistended with normal bowel sounds x4 quadrants. ?Musculoskeletal: No lower extremity edema. 2/4 pulses in all 4 extremities. ?Skin: No ulcerative lesions noted or rashes, ?Psychiatry: Mood is appropriate for condition and setting ? ?Discharge Instructions ?You were cared for by a hospitalist during your hospital stay. If you have any questions about your discharge medications or the care you received while you were in the hospital after you are discharged, you can call the unit and asked to speak with the hospitalist on call if the hospitalist that took care of you is not available. Once you are discharged, your primary care physician will handle any further medical issues. Please note that NO REFILLS for any discharge  medications will be authorized once you are discharged, as it is imperative that you return to your primary care physician (or establish a relationship with a primary care physician if you do not have one) for your aftercare needs so that they can reassess your need for medications and monitor your lab values. ? ? ?Allergies as of 01/17/2022   ? ?   Reactions  ? Ace Inhibitors Other (See Comments)  ? Pseudoasthma/ renal failure   ? ?  ? ?  ?Medication List  ?  ? ?STOP taking these medications   ? ?aspirin-sod bicarb-citric acid 325 MG Tbef tablet ?Commonly known as: ALKA-SELTZER ?  ?losartan 50 MG tablet ?Commonly known as: COZAAR ?  ? ?  ? ?TAKE these medications   ? ?acetaminophen 500 MG tablet ?Commonly known as: TYLENOL ?Take 500 mg by mouth every 8 (eight) hours as needed for mild pain. ?  ?albuterol 108 (90 Base) MCG/ACT inhaler ?Commonly known as: VENTOLIN HFA ?Inhale 2 puffs into the lungs every 6 (six) hours as needed for wheezing or shortness of breath. ?  ?aspirin EC 81 MG tablet ?Take 1 tablet (81 mg total) by mouth daily. ?  ?loperamide 2 MG capsule ?Commonly known as: IMODIUM ?Take 1 capsule (2 mg total) by mouth every 12 (twelve) hours as needed for diarrhea or loose  stools. ?  ?metoprolol tartrate 25 MG tablet ?Commonly known as: LOPRESSOR ?Take 1 tablet (25 mg total) by mouth 2 (two) times daily. ?  ?OVER THE COUNTER MEDICATION ?Take 1 tablet by mouth at bedtime. Blue tablet. [employer] or [facility] sleeping aid ?  ?oxyCODONE 5 MG immediate release tablet ?Commonly known as: Oxy IR/ROXICODONE ?Take 1 tablet (5 mg total) by mouth every 6 (six) hours as needed for up to 3 days for moderate pain. ?  ?polyethylene glycol 17 g packet ?Commonly known as: MiraLax ?Take 17 g by mouth daily. ?  ?Prolia 60 MG/ML Sosy injection ?Generic drug: denosumab ?Inject 60 mg into the skin every 6 (six) months. ?  ?Pulse Oximeter For Finger Misc ?1 Device by Does not apply route as needed. ?  ?simethicone 80 MG chewable  tablet ?Commonly known as: Gas-X ?Chew 1 tablet (80 mg total) by mouth 4 (four) times daily as needed for up to 5 days for flatulence. ?  ?Trelegy Ellipta 200-62.5-25 MCG/ACT Aepb ?Generic drug: Fluticasone-Umeclidin

## 2022-01-22 ENCOUNTER — Ambulatory Visit (HOSPITAL_COMMUNITY)
Admission: EM | Admit: 2022-01-22 | Discharge: 2022-01-22 | Disposition: A | Discharge status: Home / Self Care | Payer: Medicare Other | Attending: Internal Medicine | Admitting: Internal Medicine

## 2022-01-22 ENCOUNTER — Other Ambulatory Visit: Payer: Self-pay

## 2022-01-22 ENCOUNTER — Emergency Department (HOSPITAL_COMMUNITY): Payer: Medicare Other

## 2022-01-22 ENCOUNTER — Emergency Department (HOSPITAL_COMMUNITY)
Admission: EM | Admit: 2022-01-22 | Discharge: 2022-01-22 | Disposition: A | Discharge status: Home / Self Care | Payer: Medicare Other | Attending: Emergency Medicine | Admitting: Emergency Medicine

## 2022-01-22 ENCOUNTER — Encounter (HOSPITAL_COMMUNITY): Payer: Self-pay

## 2022-01-22 DIAGNOSIS — Z79899 Other long term (current) drug therapy: Secondary | ICD-10-CM | POA: Insufficient documentation

## 2022-01-22 DIAGNOSIS — R112 Nausea with vomiting, unspecified: Secondary | ICD-10-CM | POA: Insufficient documentation

## 2022-01-22 DIAGNOSIS — R0602 Shortness of breath: Secondary | ICD-10-CM

## 2022-01-22 DIAGNOSIS — R1031 Right lower quadrant pain: Secondary | ICD-10-CM | POA: Insufficient documentation

## 2022-01-22 DIAGNOSIS — I5032 Chronic diastolic (congestive) heart failure: Secondary | ICD-10-CM | POA: Insufficient documentation

## 2022-01-22 DIAGNOSIS — J989 Respiratory disorder, unspecified: Secondary | ICD-10-CM | POA: Diagnosis not present

## 2022-01-22 DIAGNOSIS — N183 Chronic kidney disease, stage 3 unspecified: Secondary | ICD-10-CM | POA: Diagnosis not present

## 2022-01-22 DIAGNOSIS — I130 Hypertensive heart and chronic kidney disease with heart failure and stage 1 through stage 4 chronic kidney disease, or unspecified chronic kidney disease: Secondary | ICD-10-CM | POA: Diagnosis not present

## 2022-01-22 DIAGNOSIS — Z7982 Long term (current) use of aspirin: Secondary | ICD-10-CM | POA: Insufficient documentation

## 2022-01-22 DIAGNOSIS — J449 Chronic obstructive pulmonary disease, unspecified: Secondary | ICD-10-CM | POA: Diagnosis not present

## 2022-01-22 DIAGNOSIS — R109 Unspecified abdominal pain: Secondary | ICD-10-CM | POA: Diagnosis present

## 2022-01-22 DIAGNOSIS — F17210 Nicotine dependence, cigarettes, uncomplicated: Secondary | ICD-10-CM

## 2022-01-22 LAB — COMPREHENSIVE METABOLIC PANEL
ALT: 20 U/L (ref 0–44)
AST: 24 U/L (ref 15–41)
Albumin: 4 g/dL (ref 3.5–5.0)
Alkaline Phosphatase: 75 U/L (ref 38–126)
Anion gap: 18 — ABNORMAL HIGH (ref 5–15)
BUN: 15 mg/dL (ref 8–23)
CO2: 18 mmol/L — ABNORMAL LOW (ref 22–32)
Calcium: 11.2 mg/dL — ABNORMAL HIGH (ref 8.9–10.3)
Chloride: 101 mmol/L (ref 98–111)
Creatinine, Ser: 1.42 mg/dL — ABNORMAL HIGH (ref 0.44–1.00)
GFR, Estimated: 39 mL/min — ABNORMAL LOW (ref >60)
Glucose, Bld: 135 mg/dL — ABNORMAL HIGH (ref 70–99)
Potassium: 3.4 mmol/L — ABNORMAL LOW (ref 3.5–5.1)
Sodium: 137 mmol/L (ref 135–145)
Total Bilirubin: 0.3 mg/dL (ref 0.3–1.2)
Total Protein: 7.9 g/dL (ref 6.5–8.1)

## 2022-01-22 LAB — LACTIC ACID, PLASMA
Lactic Acid, Venous: 2.7 mmol/L (ref 0.5–1.9)
Lactic Acid, Venous: 1.5 mmol/L (ref 0.5–1.9)

## 2022-01-22 LAB — CBC WITH DIFFERENTIAL/PLATELET
Abs Immature Granulocytes: 0.03 10*3/uL (ref 0.00–0.07)
Basophils Absolute: 0 10*3/uL (ref 0.0–0.1)
Basophils Relative: 0 %
Eosinophils Absolute: 0 10*3/uL (ref 0.0–0.5)
Eosinophils Relative: 0 %
HCT: 41.8 % (ref 36.0–46.0)
Hemoglobin: 13.6 g/dL (ref 12.0–15.0)
Immature Granulocytes: 0 %
Lymphocytes Relative: 13 %
Lymphs Abs: 0.9 10*3/uL (ref 0.7–4.0)
MCH: 29.8 pg (ref 26.0–34.0)
MCHC: 32.5 g/dL (ref 30.0–36.0)
MCV: 91.7 fL (ref 80.0–100.0)
Monocytes Absolute: 0.2 10*3/uL (ref 0.1–1.0)
Monocytes Relative: 3 %
Neutro Abs: 6.2 10*3/uL (ref 1.7–7.7)
Neutrophils Relative %: 84 %
Platelets: 419 10*3/uL — ABNORMAL HIGH (ref 150–400)
RBC: 4.56 MIL/uL (ref 3.87–5.11)
RDW: 12 % (ref 11.5–15.5)
WBC: 7.4 10*3/uL (ref 4.0–10.5)
nRBC: 0 % (ref 0.0–0.2)

## 2022-01-22 LAB — MAGNESIUM: Magnesium: 1.8 mg/dL (ref 1.7–2.4)

## 2022-01-22 LAB — TROPONIN I (HIGH SENSITIVITY)
Troponin I (High Sensitivity): 8 ng/L (ref <18)
Troponin I (High Sensitivity): 11 ng/L (ref <18)

## 2022-01-22 LAB — LIPASE, BLOOD: Lipase: 31 U/L (ref 11–51)

## 2022-01-22 LAB — CBG MONITORING, ED: Glucose-Capillary: 119 mg/dL — ABNORMAL HIGH (ref 70–99)

## 2022-01-22 MED ORDER — IOHEXOL 350 MG/ML SOLN
100.0000 mL | Freq: Once | INTRAVENOUS | Status: AC | PRN
  Administered 2022-01-22: 100 mL via INTRAVENOUS

## 2022-01-22 MED ORDER — METOCLOPRAMIDE HCL 10 MG PO TABS: 10.0000 mg | ORAL_TABLET | Freq: Three times a day (TID) | ORAL | 0 refills | Status: DC | PRN

## 2022-01-22 MED ORDER — LACTATED RINGERS IV BOLUS
1000.0000 mL | Freq: Once | INTRAVENOUS | Status: AC
  Administered 2022-01-22: 1000 mL via INTRAVENOUS

## 2022-01-22 MED ORDER — ALBUTEROL SULFATE (2.5 MG/3ML) 0.083% IN NEBU
INHALATION_SOLUTION | RESPIRATORY_TRACT | Status: AC
  Filled 2022-01-22: qty 3

## 2022-01-22 MED ORDER — HYDROMORPHONE HCL 1 MG/ML IJ SOLN
0.5000 mg | Freq: Once | INTRAMUSCULAR | Status: AC
  Administered 2022-01-22: 0.5 mg via INTRAVENOUS
  Filled 2022-01-22: qty 1

## 2022-01-22 MED ORDER — ONDANSETRON HCL 4 MG/2ML IJ SOLN
4.0000 mg | Freq: Once | INTRAMUSCULAR | Status: AC
  Administered 2022-01-22: 4 mg via INTRAMUSCULAR

## 2022-01-22 MED ORDER — METOCLOPRAMIDE HCL 5 MG/ML IJ SOLN
10.0000 mg | Freq: Once | INTRAMUSCULAR | Status: AC
  Administered 2022-01-22: 10 mg via INTRAVENOUS
  Filled 2022-01-22: qty 2

## 2022-01-22 MED ORDER — ALBUTEROL SULFATE (2.5 MG/3ML) 0.083% IN NEBU
2.5000 mg | INHALATION_SOLUTION | Freq: Once | RESPIRATORY_TRACT | Status: AC
  Administered 2022-01-22: 2.5 mg via RESPIRATORY_TRACT

## 2022-01-22 MED ORDER — FAMOTIDINE 20 MG PO TABS: 20.0000 mg | ORAL_TABLET | Freq: Every day | ORAL | 0 refills | Status: DC

## 2022-01-22 MED ORDER — ONDANSETRON HCL 4 MG/2ML IJ SOLN
INTRAMUSCULAR | Status: AC
  Filled 2022-01-22: qty 2

## 2022-01-22 NOTE — ED Provider Triage Note (Signed)
Emergency Medicine Provider Triage Evaluation Note ? ?Deepa Barthel , a 76 y.o. female  was evaluated in triage.  Pt complains of left hip pain with radiation down her left side exacerbated with any ambulation.  Did have AAA repair approximately 2 weeks ago, reports no complications from the surgery.  Was prescribed oxycodone for pain control, last taken this morning in addition to Tylenol.  Evaluated urgent care this morning. ? ?Review of Systems  ?Positive: Left hip pain ?Negative: Chest pain, shortness of breath, fever ? ?Physical Exam  ?There were no vitals taken for this visit. ?Gen:   Awake, no distress   ?Resp:  Normal effort  ?MSK:   Moves extremities without difficulty  ?Other:  Appears uncomfortable in wheelchair, surgical sites BL without any drainage but bruising noted.  ? ?Medical Decision Making  ?Medically screening exam initiated at 12:37 PM.  Appropriate orders placed.  Jaelyn Bourgoin was informed that the remainder of the evaluation will be completed by another provider, this initial triage assessment does not replace that evaluation, and the importance of remaining in the ED until their evaluation is complete. ? ? ?  ?Janeece Fitting, PA-C ?01/22/22 1242 ? ?

## 2022-01-22 NOTE — ED Triage Notes (Signed)
Pt presents today with vomiting. Pt states she has not been eating right since she can't taste food. Pt states she took oxycodone this morning. Pt states left pain on her hip, waist, and upper left leg.. Pt states she was released from the hospital last Friday.  ?

## 2022-01-22 NOTE — Discharge Instructions (Addendum)
After evaluating you in urgent care today it is recommended that you go to the emergency department for further evaluation of your symptoms.  We gave you an albuterol treatment to help with your breathing while you were here as well as 4 mg of Zofran into your muscle to help with your vomiting.  Your blood sugar is normal and your EKG looks normal, but we are still very concerned given your 1 week postop history from your aortic valve replacement.  ? ?We have limited resources at the urgent care to rule out for emergencies and therefore please report to the ER immediately for further work-up. ? ? ?

## 2022-01-22 NOTE — ED Provider Notes (Signed)
?[facility] ? ? ? ?CSN: [id number] ?Arrival date & time: 01/22/22  1017 ? ? ?  ? ?History   ?Chief Complaint ?Chief Complaint  ?Patient presents with  ? Emesis  ? ? ?HPI ?Katie [name] is a 76 y.o. female.  ? ?Patient presents to urgent care today for evaluation of abdominal pain emesis that started yesterday.  She reports having 5-6 episodes of emesis throughout the night last night significant left upper and lower lateral abdominal pain. Reports she had an aortic valve replacement last week and pain/vomiting suddenly began yesterday evening.  She attempted to take Tylenol last night for her pain, however she was unable to keep it down.  Abdominal pain radiates to anterior aspect of left thigh that comes and goes.  Rates pain as a 9 on a 0 out of 10 pain scale.  Denies constipation, diarrhea, shortness of breath, headache, and chest pain.  Denies urinary symptoms. ? ?She is having a very difficult time treating currently and states that she did not use her albuterol inhaler this morning.  Blood pressure is significantly elevated in clinic and she reports that she did not take her metoprolol or her losartan this morning.  ? ? ?Emesis ?Associated symptoms: abdominal pain   ?Associated symptoms: no arthralgias, no chills, no cough, no diarrhea, no fever and no sore throat   ? ?Past Medical History:  ?Diagnosis Date  ? AAA (abdominal aortic aneurysm) ([place])   ? Arthritis   ? Cholelithiasis 2011  ? s/p cholescystectomy   ? Chronic diastolic congestive heart failure ([place])   ? COPD (chronic obstructive pulmonary disease) ([place])   ? Emphysema   ? GERD (gastroesophageal reflux disease)   ? History of heartburn   ? History of renal cell carcinoma   ? Hypercholesteremia   ? Hypertension   ? Hypertension   ? Mitral regurgitation   ? Pneumonia 1980's  ? Renal cell carcinoma 01/2010  ? s/p right radical nephrectomy 01/2010,  followed by [facility]  ? S/P minimally invasive mitral valve repair 04/20/2019  ? Complex  valvuloplasty including triangular resection of flail segment of posterior leaflet, artificial Gore-tex neochord placement x6 and 30 mm Sorin Memo 4D ring annuloplasty via right mini thoracotomy approach  ? Shortness of breath   ? Tuberculosis   ? Tests positive for PPD. dad had h/o TB.  ? ? ?Patient Active Problem List  ? Diagnosis Date Noted  ? Ileus ([place]) 01/14/2022  ? Abnormal urinalysis 01/11/2022  ? Abnormal echocardiogram 01/11/2022  ? Gastroenteritis 01/10/2022  ? Thyroid nodule 01/10/2022  ? Hepatic cyst 01/10/2022  ? Abdominal aortic aneurysm (AAA) 3.0 cm to 5.0 cm in diameter in female [facility]) 01/09/2022  ? Intractable nausea and vomiting 07/23/2021  ? Generalized abdominal pain 07/23/2021  ? QT prolongation 07/23/2021  ? AAA (abdominal aortic aneurysm) without rupture ([place]) 05/04/2020  ? Atrial fibrillation ([place]) 04/26/2019  ? S/P minimally invasive mitral valve repair 04/20/2019  ? Chronic diastolic congestive heart failure ([place])   ? Pneumonia 04/04/2019  ? Smokers' cough ([place]) 03/13/2019  ? COPD with acute exacerbation ([place]) 03/13/2019  ? Fx metatarsal 03/07/2019  ? CKD stage G3a/A1, GFR 45-59 and albumin creatinine ratio <30 mg/g ([facility]) 09/29/2018  ? Osteoporosis 12/31/2016  ? Arthralgia of both hands 11/11/2016  ? Chest pain 04/17/2016  ? Hyperglycemia 08/03/2015  ? Esophageal reflux   ? Depression with anxiety   ? AKI (acute kidney injury) ([place])   ? Tobacco abuse disorder 06/27/2014  ? Insomnia  07/15/2013  ? Severe mitral regurgitation 09/29/2011  ? COPD 09/26/2011  ? Essential hypertension 09/26/2011  ? HLD (hyperlipidemia) 09/26/2011  ? S/P cholecystectomy 09/26/2011  ? Holosystolic murmur 83/15/1761  ? ? ?Past Surgical History:  ?Procedure Laterality Date  ? ABDOMINAL AORTIC ENDOVASCULAR STENT GRAFT N/A 01/10/2022  ? Procedure: ABDOMINAL AORTIC ENDOVASCULAR STENT GRAFT;  Surgeon: Marty Heck, MD;  Location: [place];  Service: Vascular;  Laterality: N/A;  ? APPENDECTOMY  1970's  ? BUBBLE STUDY   03/18/2019  ? Procedure: BUBBLE STUDY;  Surgeon: Fay Records, MD;  Location: [facility];  Service: Cardiovascular;;  ? CARDIAC CATHETERIZATION  09/2011  ? CHOLECYSTECTOMY  01/2010  ? DIAGNOSTIC LAPAROSCOPY    ? KIDNEY SURGERY    ? LEFT AND RIGHT HEART CATHETERIZATION WITH CORONARY ANGIOGRAM N/A 09/30/2011  ? Procedure: LEFT AND RIGHT HEART CATHETERIZATION WITH CORONARY ANGIOGRAM;  Surgeon: Candee Furbish, MD;  Location: [facility] CATH LAB;  Service: Cardiovascular;  Laterality: N/A;  ? MITRAL VALVE REPAIR Right 04/20/2019  ? Procedure: MINIMALLY INVASIVE MITRAL VALVE REPAIR (MVR) USING MEMO 4D SIZE 30;  Surgeon: Rexene Alberts, MD;  Location: [place];  Service: Open Heart Surgery;  Laterality: Right;  ? MULTIPLE EXTRACTIONS WITH ALVEOLOPLASTY  10/06/2011  ? Procedure: MULTIPLE EXTRACION WITH ALVEOLOPLASTY;  Surgeon: Lenn Cal, DDS;  Location: [place];  Service: Oral Surgery;  Laterality: N/A;  Multiple extraction of tooth #'s 1, 6, 8, 10, 11, 22, 23, 26, 27, 28, 29 with alveoloplasty and Upper right buccal exostoses reductions.  ? NEPHRECTOMY RADICAL  01/2010  ? right   ? OVARIAN CYST REMOVAL  1970's  ? "went through belly button"  ? RIGHT/LEFT HEART CATH AND CORONARY ANGIOGRAPHY N/A 03/22/2019  ? Procedure: RIGHT/LEFT HEART CATH AND CORONARY ANGIOGRAPHY;  Surgeon: [name], MD;  Location: [place] CV LAB;  Service: Cardiovascular;  Laterality: N/A;  ? TEE WITHOUT CARDIOVERSION  10/01/2011  ? Procedure: TRANSESOPHAGEAL ECHOCARDIOGRAM (TEE);  Surgeon: [name];  Location: [place];  Service: Cardiovascular;  Laterality: N/A;  ? TEE WITHOUT CARDIOVERSION N/A 03/18/2019  ? Procedure: TRANSESOPHAGEAL ECHOCARDIOGRAM (TEE);  Surgeon: Fay Records, MD;  Location: [place];  Service: Cardiovascular;  Laterality: N/A;  ? TEE WITHOUT CARDIOVERSION N/A 04/20/2019  ? Procedure: TRANSESOPHAGEAL ECHOCARDIOGRAM (TEE);  Surgeon: Rexene Alberts, MD;  Location: [place];  Service: Open Heart Surgery;   Laterality: N/A;  ? TUBAL LIGATION  1970's  ? US ECHOCARDIOGRAPHY  09/2011  ? ? ?OB History   ?No obstetric history on file. ?  ? ? ? ?Home Medications   ? ?Prior to Admission medications   ?Medication Sig Start Date End Date Taking? Authorizing Provider  ?acetaminophen (TYLENOL) 500 MG tablet Take 500 mg by mouth every 8 (eight) hours as needed for mild pain.    [provider]  ?albuterol (VENTOLIN HFA) 108 (90 Base) MCG/ACT inhaler Inhale 2 puffs into the lungs every 6 (six) hours as needed for wheezing or shortness of breath. 02/08/20   Brunetta Jeans, PA-C  ?aspirin EC 81 MG tablet Take 1 tablet (81 mg total) by mouth daily. 09/29/18   Bonnielee Haff, MD  ?loperamide (IMODIUM) 2 MG capsule Take 1 capsule (2 mg total) by mouth every 12 (twelve) hours as needed for diarrhea or loose stools. 01/17/22   Kayleen Memos, DO  ?metoprolol tartrate (LOPRESSOR) 25 MG tablet Take 1 tablet (25 mg total) by mouth 2 (two) times daily. 04/23/21   Kennyth Arnold, FNP  ?  Misc. Devices (PULSE OXIMETER FOR FINGER) MISC 1 Device by Does not apply route as needed. 03/11/19   Brunetta Jeans, PA-C  ?OVER THE COUNTER MEDICATION Take 1 tablet by mouth at bedtime. Blue tablet. [employer] or [facility] sleeping aid    [provider]  ?polyethylene glycol (MIRALAX) 17 g packet Take 17 g by mouth daily. 01/17/22   Kayleen Memos, DO  ?PROLIA 60 MG/ML SOSY injection Inject 60 mg into the skin every 6 (six) months. 05/16/21   [provider]  ?simethicone (GAS-X) 80 MG chewable tablet Chew 1 tablet (80 mg total) by mouth 4 (four) times daily as needed for up to 5 days for flatulence. 01/17/22 01/22/22  Kayleen Memos, DO  ?TRELEGY ELLIPTA 200-62.5-25 MCG/INH AEPB Inhale 1 puff into the lungs daily. 07/17/21   [provider]  ? ? ?Family History ?Family History  ?Problem Relation Age of Onset  ? COPD Mother   ?     DECEASED/SMOKED  ? Diabetes Brother   ? Diabetes Brother   ? ? ?Social History ?Social History   ? ?Tobacco Use  ? Smoking status: Some Days  ?  Packs/day: 1.00  ?  Years: 53.00  ?  Pack years: 53.00  ?  Types: Cigarettes  ?  Last attempt to quit: 03/18/2019  ?  Years since quitting: 2.8  ?  Passive exposu

## 2022-01-22 NOTE — ED Provider Notes (Signed)
?[facility] ?Provider Note ? ? ?CSN: [id number] ?Arrival date & time: 01/22/22  1234 ? ?  ? ?History ? ?Chief Complaint  ?Patient presents with  ? Leg Pain  ? Hip Pain  ? ? ?Katie [name] is a 76 y.o. female. ? ? ?Leg Pain ?Hip Pain ?Associated symptoms include abdominal pain. Patient presents to the ED from urgent care following an initial evaluation for abdominal pain, nausea, and vomiting.  Abdominal pain started yesterday and vomiting occurred overnight.  Abdominal pain is located in left side of abdomen.  At urgent care, she was also noted to be short of breath.  She had endovascular repair of a AAA 2 weeks ago.  She was subsequently hospitalized 1 week ago for abdominal pain, nausea, and vomiting.  She was discharged 5 days ago.  Since her visit urgent care, she now endorses left hip and leg pain.  Additional medical history includes HTN, HLD, AAA sp recent endovascular repair, chronic diastolic CHF, CKD 3 COPD, GERD, RCC status post prior right nephrectomy, complex mitral valvuloplasty. ? ?  ? ?Home Medications ?Prior to Admission medications   ?Medication Sig Start Date End Date Taking? Authorizing Provider  ?famotidine (PEPCID) 20 MG tablet Take 1 tablet (20 mg total) by mouth daily. 01/22/22  Yes Godfrey Pick, MD  ?metoCLOPramide (REGLAN) 10 MG tablet Take 1 tablet (10 mg total) by mouth every 8 (eight) hours as needed for up to 20 doses for nausea. 01/22/22  Yes Godfrey Pick, MD  ?acetaminophen (TYLENOL) 500 MG tablet Take 500 mg by mouth every 8 (eight) hours as needed for mild pain.    [provider]  ?albuterol (VENTOLIN HFA) 108 (90 Base) MCG/ACT inhaler Inhale 2 puffs into the lungs every 6 (six) hours as needed for wheezing or shortness of breath. 02/08/20   Brunetta Jeans, PA-C  ?aspirin EC 81 MG tablet Take 1 tablet (81 mg total) by mouth daily. 09/29/18   Bonnielee Haff, MD  ?loperamide (IMODIUM) 2 MG capsule Take 1 capsule (2 mg total) by mouth every  12 (twelve) hours as needed for diarrhea or loose stools. 01/17/22   Kayleen Memos, DO  ?metoprolol tartrate (LOPRESSOR) 25 MG tablet Take 1 tablet (25 mg total) by mouth 2 (two) times daily. 04/23/21   Kennyth Arnold, FNP  ?Misc. Devices (PULSE OXIMETER FOR FINGER) MISC 1 Device by Does not apply route as needed. 03/11/19   Brunetta Jeans, PA-C  ?OVER THE COUNTER MEDICATION Take 1 tablet by mouth at bedtime. Blue tablet. [facility] or [facility] sleeping aid    [provider]  ?polyethylene glycol (MIRALAX) 17 g packet Take 17 g by mouth daily. 01/17/22   Kayleen Memos, DO  ?PROLIA 60 MG/ML SOSY injection Inject 60 mg into the skin every 6 (six) months. 05/16/21   [provider]  ?simethicone (GAS-X) 80 MG chewable tablet Chew 1 tablet (80 mg total) by mouth 4 (four) times daily as needed for up to 5 days for flatulence. 01/17/22 01/22/22  Kayleen Memos, DO  ?TRELEGY ELLIPTA 200-62.5-25 MCG/INH AEPB Inhale 1 puff into the lungs daily. 07/17/21   [provider]  ?   ? ?Allergies    ?Ace inhibitors   ? ?Review of Systems   ?Review of Systems  ?Gastrointestinal:  Positive for abdominal pain, nausea and vomiting.  ?Musculoskeletal:  Positive for myalgias.  ?All other systems reviewed and are negative. ? ?Physical Exam ?Updated Vital Signs ?BP 140/86 (BP Location:  Left Arm)   Pulse 76   Temp 98.2 ?F (36.8 ?C) (Oral)   Resp 16   Ht '5\' 1"'$  (1.549 m)   Wt 72.1 kg   SpO2 98%   BMI 30.03 kg/m?  ?Physical Exam ?Vitals and nursing note reviewed.  ?Constitutional:   ?   General: She is not in acute distress. ?   Appearance: Normal appearance. She is well-developed. She is not ill-appearing, toxic-appearing or diaphoretic.  ?HENT:  ?   Head: Normocephalic and atraumatic.  ?   Right Ear: External ear normal.  ?   Left Ear: External ear normal.  ?   Nose: Nose normal.  ?Eyes:  ?   General: No scleral icterus. ?   Extraocular Movements: Extraocular movements intact.  ?   Conjunctiva/sclera: Conjunctivae  normal.  ?Cardiovascular:  ?   Rate and Rhythm: Normal rate and regular rhythm.  ?   Heart sounds: No murmur heard. ?Pulmonary:  ?   Effort: Pulmonary effort is normal. No respiratory distress.  ?   Breath sounds: Normal breath sounds. No wheezing or rales.  ?Abdominal:  ?   Palpations: Abdomen is soft.  ?   Tenderness: There is abdominal tenderness (Left side). There is no guarding or rebound.  ?Musculoskeletal:     ?   General: No swelling or deformity. Normal range of motion.  ?   Cervical back: Normal range of motion and neck supple.  ?   Right lower leg: No edema.  ?   Left lower leg: No edema.  ?Skin: ?   General: Skin is warm and dry.  ?   Capillary Refill: Capillary refill takes less than 2 seconds.  ?   Coloration: Skin is not jaundiced or pale.  ?Neurological:  ?   General: No focal deficit present.  ?   Mental Status: She is alert and oriented to person, place, and time.  ?   Cranial Nerves: No cranial nerve deficit.  ?   Sensory: No sensory deficit.  ?   Motor: No weakness.  ?   Coordination: Coordination normal.  ?Psychiatric:     ?   Mood and Affect: Affect normal. Mood is anxious.     ?   Speech: Speech normal.     ?   Behavior: Behavior normal. Behavior is cooperative.  ? ? ?ED Results / Procedures / Treatments   ?Labs ?(all labs ordered are listed, but only abnormal results are displayed) ?Labs Reviewed  ?CBC WITH DIFFERENTIAL/PLATELET - Abnormal; Notable for the following components:  ?    Result Value  ? Platelets 419 (*)   ? All other components within normal limits  ?COMPREHENSIVE METABOLIC PANEL - Abnormal; Notable for the following components:  ? Potassium 3.4 (*)   ? CO2 18 (*)   ? Glucose, Bld 135 (*)   ? Creatinine, Ser 1.42 (*)   ? Calcium 11.2 (*)   ? GFR, Estimated 39 (*)   ? Anion gap 18 (*)   ? All other components within normal limits  ?LACTIC ACID, PLASMA - Abnormal; Notable for the following components:  ? Lactic Acid, Venous 2.7 (*)   ? All other components within normal limits   ?LIPASE, BLOOD  ?LACTIC ACID, PLASMA  ?MAGNESIUM  ?URINALYSIS, ROUTINE W REFLEX MICROSCOPIC  ?TROPONIN I (HIGH SENSITIVITY)  ?TROPONIN I (HIGH SENSITIVITY)  ? ? ?EKG ?None ? ?Radiology ?CT Angio Aortobifemoral W and/or Wo Contrast ? ?Result Date: 01/22/2022 ?CLINICAL DATA:  Left hip pain radiating to the left  abdomen, recent AAA stent graft repair 01/10/2022 EXAM: CT ANGIOGRAPHY OF ABDOMINAL AORTA WITH ILIOFEMORAL RUNOFF TECHNIQUE: Multidetector CT imaging of the abdomen, pelvis and lower extremities was performed using the standard protocol during bolus administration of intravenous contrast. Multiplanar CT image reconstructions and MIPs were obtained to evaluate the vascular anatomy. RADIATION DOSE REDUCTION: This exam was performed according to the departmental dose-optimization program which includes automated exposure control, adjustment of the mA and/or kV according to patient size and/or use of iterative reconstruction technique. CONTRAST:  124m OMNIPAQUE IOHEXOL 350 MG/ML SOLN COMPARISON:  01/14/2022 FINDINGS: VASCULAR Aorta: Patient is status post bifurcated stent graft repair of the infrarenal AAA. Stent graft extends from just inferior to the left renal artery and into the common iliac arteries bilaterally. Stent graft lumen is patent. Small amount of thin intragraft hypodense thrombus along the proximal graft wall but without significant luminal narrowing or occlusion. By single-phase imaging, no significant endoleak appreciated. Measuring at similar levels, native aneurysm sac diameter is stable at 4.5 cm. No surrounding periaortic inflammatory process. No retroperitoneal hemorrhage or hematoma. Celiac: Remains patent including its branches. SMA: Remains patent including a replaced right hepatic artery. Renals: Left renal artery remains patent. Patient is status post remote right nephrectomy. IMA: Occluded origin off the distal aneurysm. IMA is reconstituted via SMA collateral pathways. RIGHT Lower  Extremity Inflow: Right iliac vasculature remain patent. No inflow disease or occlusion. Outflow: Right common femoral, profunda femoral, and SFA all remain patent. No significant SFA disease. Popliteal artery pa

## 2022-01-22 NOTE — ED Notes (Signed)
Patient is being discharged from the Urgent Care and sent to the Emergency Department via wheelchair with staff . Per Talbot Grumbling FNP, patient is in need of higher level of care due to high blood pressure,SOB, and vomiting . Patient is aware and verbalizes understanding of plan of care.  ?Vitals:  ? 01/22/22 1148 01/22/22 1158  ?BP:  (!) 196/106  ?Pulse:  66  ?Resp:  (!) 24  ?Temp:    ?SpO2: 97% 99%  ?  ?

## 2022-01-22 NOTE — Discharge Instructions (Addendum)
Your work-up here in the ED was reassuring.  We did do imaging of your abdomen and the vessels in your abdomen and legs, including the area of your recent surgery.  All of these areas look fine. ? ?There are medications sent to your pharmacy.  Pepcid is a medication that treats stomach upset.  You can take this every day as prescribed.  If you feel that it helps, talk to your primary care doctor about continued use of it.  The second medication is called Reglan.  This is to treat nausea.  Take this only as needed.  Schedule a follow-up appointment with your primary care doctor to discuss these recurrent symptoms.  ? ?Return to the emergency department if you have worsening symptoms ?

## 2022-01-22 NOTE — ED Triage Notes (Signed)
Pt arrived POV from home c/o left hip and leg pain. Pt sates she cannot talk anymore and answer questions due to pain.  ?

## 2022-01-24 ENCOUNTER — Other Ambulatory Visit: Payer: Self-pay | Admitting: Family Medicine

## 2022-01-24 ENCOUNTER — Ambulatory Visit
Admission: RE | Admit: 2022-01-24 | Discharge: 2022-01-24 | Disposition: A | Discharge status: Home / Self Care | Payer: Medicare Other | Source: Ambulatory Visit | Attending: Family Medicine | Admitting: Family Medicine

## 2022-01-24 DIAGNOSIS — R1032 Left lower quadrant pain: Secondary | ICD-10-CM

## 2022-01-30 ENCOUNTER — Other Ambulatory Visit: Payer: Self-pay

## 2022-01-30 DIAGNOSIS — I714 Abdominal aortic aneurysm, without rupture, unspecified: Secondary | ICD-10-CM

## 2022-02-04 ENCOUNTER — Inpatient Hospital Stay: Admission: RE | Admit: 2022-02-04 | Payer: Medicare Other | Source: Ambulatory Visit

## 2022-02-07 ENCOUNTER — Other Ambulatory Visit: Payer: Self-pay

## 2022-02-07 ENCOUNTER — Encounter (HOSPITAL_COMMUNITY): Payer: Self-pay | Admitting: Emergency Medicine

## 2022-02-07 ENCOUNTER — Emergency Department (HOSPITAL_COMMUNITY)
Admission: EM | Admit: 2022-02-07 | Discharge: 2022-02-07 | Disposition: A | Discharge status: Home / Self Care | Payer: Medicare Other | Attending: Emergency Medicine | Admitting: Emergency Medicine

## 2022-02-07 DIAGNOSIS — R443 Hallucinations, unspecified: Secondary | ICD-10-CM

## 2022-02-07 DIAGNOSIS — R000 Tachycardia, unspecified: Secondary | ICD-10-CM | POA: Insufficient documentation

## 2022-02-07 DIAGNOSIS — N189 Chronic kidney disease, unspecified: Secondary | ICD-10-CM | POA: Diagnosis not present

## 2022-02-07 DIAGNOSIS — F1410 Cocaine abuse, uncomplicated: Secondary | ICD-10-CM | POA: Diagnosis not present

## 2022-02-07 DIAGNOSIS — Z79899 Other long term (current) drug therapy: Secondary | ICD-10-CM | POA: Diagnosis not present

## 2022-02-07 DIAGNOSIS — F1910 Other psychoactive substance abuse, uncomplicated: Secondary | ICD-10-CM

## 2022-02-07 DIAGNOSIS — R441 Visual hallucinations: Secondary | ICD-10-CM | POA: Diagnosis present

## 2022-02-07 DIAGNOSIS — Z7982 Long term (current) use of aspirin: Secondary | ICD-10-CM | POA: Insufficient documentation

## 2022-02-07 DIAGNOSIS — I129 Hypertensive chronic kidney disease with stage 1 through stage 4 chronic kidney disease, or unspecified chronic kidney disease: Secondary | ICD-10-CM | POA: Insufficient documentation

## 2022-02-07 LAB — SALICYLATE LEVEL: Salicylate Lvl: <7 mg/dL — ABNORMAL LOW (ref 7.0–30.0)

## 2022-02-07 LAB — COMPREHENSIVE METABOLIC PANEL
ALT: 14 U/L (ref 0–44)
AST: 21 U/L (ref 15–41)
Albumin: 3.6 g/dL (ref 3.5–5.0)
Alkaline Phosphatase: 63 U/L (ref 38–126)
Anion gap: 12 (ref 5–15)
BUN: 21 mg/dL (ref 8–23)
CO2: 19 mmol/L — ABNORMAL LOW (ref 22–32)
Calcium: 9.6 mg/dL (ref 8.9–10.3)
Chloride: 101 mmol/L (ref 98–111)
Creatinine, Ser: 1.78 mg/dL — ABNORMAL HIGH (ref 0.44–1.00)
GFR, Estimated: 30 mL/min — ABNORMAL LOW (ref >60)
Glucose, Bld: 121 mg/dL — ABNORMAL HIGH (ref 70–99)
Potassium: 3.7 mmol/L (ref 3.5–5.1)
Sodium: 132 mmol/L — ABNORMAL LOW (ref 135–145)
Total Bilirubin: 1 mg/dL (ref 0.3–1.2)
Total Protein: 7.1 g/dL (ref 6.5–8.1)

## 2022-02-07 LAB — RAPID URINE DRUG SCREEN, HOSP PERFORMED
Amphetamines: NOT DETECTED
Barbiturates: NOT DETECTED
Benzodiazepines: NOT DETECTED
Cocaine: POSITIVE — AB
Opiates: NOT DETECTED
Tetrahydrocannabinol: NOT DETECTED

## 2022-02-07 LAB — URINALYSIS, ROUTINE W REFLEX MICROSCOPIC
Bilirubin Urine: NEGATIVE
Glucose, UA: NEGATIVE mg/dL
Hgb urine dipstick: NEGATIVE
Ketones, ur: 5 mg/dL — AB
Nitrite: NEGATIVE
Protein, ur: 30 mg/dL — AB
Specific Gravity, Urine: 1.017 (ref 1.005–1.030)
pH: 5 (ref 5.0–8.0)

## 2022-02-07 LAB — CBC
HCT: 39.8 % (ref 36.0–46.0)
Hemoglobin: 13.6 g/dL (ref 12.0–15.0)
MCH: 30.2 pg (ref 26.0–34.0)
MCHC: 34.2 g/dL (ref 30.0–36.0)
MCV: 88.4 fL (ref 80.0–100.0)
Platelets: 341 10*3/uL (ref 150–400)
RBC: 4.5 MIL/uL (ref 3.87–5.11)
RDW: 12.1 % (ref 11.5–15.5)
WBC: 7.4 10*3/uL (ref 4.0–10.5)
nRBC: 0 % (ref 0.0–0.2)

## 2022-02-07 LAB — ETHANOL: Alcohol, Ethyl (B): <10 mg/dL (ref <10)

## 2022-02-07 LAB — ACETAMINOPHEN LEVEL: Acetaminophen (Tylenol), Serum: <10 ug/mL — ABNORMAL LOW (ref 10–30)

## 2022-02-07 MED ORDER — LORAZEPAM 2 MG/ML IJ SOLN
1.0000 mg | Freq: Once | INTRAMUSCULAR | Status: AC
  Administered 2022-02-07: 1 mg via INTRAVENOUS
  Filled 2022-02-07: qty 1

## 2022-02-07 MED ORDER — HALOPERIDOL LACTATE 5 MG/ML IJ SOLN
2.0000 mg | Freq: Once | INTRAMUSCULAR | Status: AC
  Administered 2022-02-07: 2 mg via INTRAMUSCULAR
  Filled 2022-02-07: qty 1

## 2022-02-07 NOTE — ED Provider Notes (Signed)
Care assumed from Dr. Ralene Bathe this morning at time of transfer care, patient is waiting for metabolism and reassessment.. ? ?Patient was feeling much better and was able to eat and drink and ambulate without difficulty.  She has appeared to safely metabolize the center system.  She would like to go home and feel she is safe for discharge home.  Patient discharged in good condition to follow-up with PCP. ? ?Clinical Impression: ?1. Polysubstance abuse ([place])   ?2. Hallucinations   ? ? ?Disposition: Discharge ? ?Condition: Good ? ?I have discussed the results, Dx and Tx plan with the pt(& family if present). He/she/they expressed understanding and agree(s) with the plan. Discharge instructions discussed at great length. Strict return precautions discussed and pt &/or family have verbalized understanding of the instructions. No further questions at time of discharge.  ? ? ?New Prescriptions  ? No medications on file  ? ? ?Follow Up: ?Roselee Nova, MD ?[address] ?[place] [address] ?[phone] ? ? ? ? ?[facility] ?[address] ?[id number] mc ?[place] ?[phone] ? ? ? ? ? ? ?  ?Marla Pouliot, Gwenyth Allegra, MD ?02/07/22 1406 ? ?

## 2022-02-07 NOTE — ED Notes (Signed)
Pt sister to pick up from lobby upon d/c.  ?

## 2022-02-07 NOTE — ED Notes (Signed)
Pt A+Ox4. Given sandwich and drink. Pt able to ambulate to bathroom unassisted.  ?

## 2022-02-07 NOTE — ED Notes (Addendum)
Pt agitated, trying to exit bed. Pt removed all monitoring equipment. Security called to bedside. Pt able to be coaxed back into room, but refuses monitoring equipment at this time. MD Ralene Bathe aware. See new orders.  ?

## 2022-02-07 NOTE — Discharge Instructions (Signed)
Please follow-up with your PCP for further management.  Please rest and stay hydrated. ?

## 2022-02-07 NOTE — ED Triage Notes (Addendum)
Pt BIB EMS after using cocaine and having hallucinations tonight. Pt was initially hypotensive with EMS. After 573m bolus, bp increased 130/86. Has not slept in 4 days. Denies SI/HI ?

## 2022-02-07 NOTE — ED Provider Notes (Signed)
?[facility] ?Provider Note ? ? ?CSN: [id number] ?Arrival date & time: 02/07/22  0350 ? ?  ? ?History ? ?Chief Complaint  ?Patient presents with  ? Drug Problem  ? ? ?Katie [name] is a 76 y.o. female. ? ?The history is provided by the patient.  ?Drug Problem ?Katie [name] is a 76 y.o. female who presents to the Emergency Department complaining of drug problem.  She presents to the ED for evaluation of hallucinations after snorting cocaine around 6pm.  She has visual hallucinations.  No prior similar episodes.  No reports of CP, HA, SOB, AP.  No SI/HI.  EMS reports hypotensive episode.  Pt uses cocaine weekly.  Never had hallucinations in the past.   ? ?  ? ?Home Medications ?Prior to Admission medications   ?Medication Sig Start Date End Date Taking? Authorizing Provider  ?acetaminophen (TYLENOL) 500 MG tablet Take 500 mg by mouth every 8 (eight) hours as needed for mild pain.    [provider]  ?albuterol (VENTOLIN HFA) 108 (90 Base) MCG/ACT inhaler Inhale 2 puffs into the lungs every 6 (six) hours as needed for wheezing or shortness of breath. 02/08/20   Brunetta Jeans, PA-C  ?aspirin EC 81 MG tablet Take 1 tablet (81 mg total) by mouth daily. 09/29/18   Bonnielee Haff, MD  ?famotidine (PEPCID) 20 MG tablet Take 1 tablet (20 mg total) by mouth daily. 01/22/22   Godfrey Pick, MD  ?loperamide (IMODIUM) 2 MG capsule Take 1 capsule (2 mg total) by mouth every 12 (twelve) hours as needed for diarrhea or loose stools. 01/17/22   Kayleen Memos, DO  ?metoCLOPramide (REGLAN) 10 MG tablet Take 1 tablet (10 mg total) by mouth every 8 (eight) hours as needed for up to 20 doses for nausea. 01/22/22   Godfrey Pick, MD  ?metoprolol tartrate (LOPRESSOR) 25 MG tablet Take 1 tablet (25 mg total) by mouth 2 (two) times daily. 04/23/21   Kennyth Arnold, FNP  ?Misc. Devices (PULSE OXIMETER FOR FINGER) MISC 1 Device by Does not apply route as needed. 03/11/19   Brunetta Jeans, PA-C   ?OVER THE COUNTER MEDICATION Take 1 tablet by mouth at bedtime. Blue tablet. [employer] or [facility] sleeping aid    [provider]  ?polyethylene glycol (MIRALAX) 17 g packet Take 17 g by mouth daily. 01/17/22   Kayleen Memos, DO  ?PROLIA 60 MG/ML SOSY injection Inject 60 mg into the skin every 6 (six) months. 05/16/21   [provider]  ?simethicone (GAS-X) 80 MG chewable tablet Chew 1 tablet (80 mg total) by mouth 4 (four) times daily as needed for up to 5 days for flatulence. 01/17/22 01/22/22  Kayleen Memos, DO  ?TRELEGY ELLIPTA 200-62.5-25 MCG/INH AEPB Inhale 1 puff into the lungs daily. 07/17/21   [provider]  ?   ? ?Allergies    ?Ace inhibitors   ? ?Review of Systems   ?Review of Systems  ?All other systems reviewed and are negative. ? ?Physical Exam ?Updated Vital Signs ?BP 109/78   Pulse 97   Temp (!) 97.5 ?F (36.4 ?C) (Oral)   Resp 12   Ht '5\' 1"'$  (1.549 m)   Wt 73 kg   SpO2 96%   BMI 30.41 kg/m?  ?Physical Exam ?Vitals and nursing note reviewed.  ?Constitutional:   ?   Appearance: She is well-developed.  ?HENT:  ?   Head: Normocephalic and atraumatic.  ?Cardiovascular:  ?   Rate  and Rhythm: Regular rhythm. Tachycardia present.  ?   Heart sounds: No murmur heard. ?Pulmonary:  ?   Effort: Pulmonary effort is normal. No respiratory distress.  ?   Breath sounds: Normal breath sounds.  ?Abdominal:  ?   Palpations: Abdomen is soft.  ?   Tenderness: There is no abdominal tenderness. There is no guarding or rebound.  ?Musculoskeletal:     ?   General: No tenderness.  ?Skin: ?   General: Skin is warm and dry.  ?Neurological:  ?   Mental Status: She is alert and oriented to person, place, and time.  ?   Comments: 5/5 strength in all four extremities with sensation to light touch intact in all four extremities.  Visual fields grossly intact.   ?Psychiatric:  ?   Comments: Anxious, actively hallucinating.  No SI/HI.    ? ? ?ED Results / Procedures / Treatments   ?Labs ?(all labs  ordered are listed, but only abnormal results are displayed) ?Labs Reviewed  ?COMPREHENSIVE METABOLIC PANEL - Abnormal; Notable for the following components:  ?    Result Value  ? Sodium 132 (*)   ? CO2 19 (*)   ? Glucose, Bld 121 (*)   ? Creatinine, Ser 1.78 (*)   ? GFR, Estimated 30 (*)   ? All other components within normal limits  ?SALICYLATE LEVEL - Abnormal; Notable for the following components:  ? Salicylate Lvl <5.8 (*)   ? All other components within normal limits  ?ACETAMINOPHEN LEVEL - Abnormal; Notable for the following components:  ? Acetaminophen (Tylenol), Serum <10 (*)   ? All other components within normal limits  ?RAPID URINE DRUG SCREEN, HOSP PERFORMED - Abnormal; Notable for the following components:  ? Cocaine POSITIVE (*)   ? All other components within normal limits  ?URINALYSIS, ROUTINE W REFLEX MICROSCOPIC - Abnormal; Notable for the following components:  ? APPearance HAZY (*)   ? Ketones, ur 5 (*)   ? Protein, ur 30 (*)   ? Leukocytes,Ua SMALL (*)   ? Bacteria, UA RARE (*)   ? All other components within normal limits  ?ETHANOL  ?CBC  ? ? ?EKG ?EKG Interpretation ? ?Date/Time:  Friday February 07 2022 05:20:54 EDT ?Ventricular Rate:  104 ?PR Interval:  119 ?QRS Duration: 88 ?QT Interval:  361 ?QTC Calculation: 475 ?R Axis:   1 ?Text Interpretation: Sinus tachycardia Probable left atrial enlargement Confirmed by Quintella Reichert [id number]) on 02/07/2022 5:24:23 AM ? ?Radiology ?No results found. ? ?Procedures ?Procedures  ? ? ?Medications Ordered in ED ?Medications  ?haloperidol lactate (HALDOL) injection 2 mg (2 mg Intramuscular Given 02/07/22 0530)  ?LORazepam (ATIVAN) injection 1 mg (1 mg Intravenous Given 02/07/22 0544)  ? ? ?ED Course/ Medical Decision Making/ A&P ?  ?                        ?Medical Decision Making ?Amount and/or Complexity of Data Reviewed ?Labs: ordered. ? ?Risk ?Prescription drug management. ? ? ?Patient with history of CKD, hypertension, aortic aneurysm repair here for  evaluation of hallucinations in setting of using cocaine.  No prior similar symptoms in the past.  Labs with mild worsening in her creatinine, otherwise no additional significant abnormalities.  UA is not consistent with UTI.  She has no focal neurologic deficits concerning for acute CVA.  Given patient's severity of symptoms she did require medications for her hallucinations as they were very disturbing to her.  Patient care transferred  pending metabolization of her drugs.  Anticipate that she may be a candidate for discharge home if her hallucinations resolved when she awakens. ? ? ? ? ? ? ? ?Final Clinical Impression(s) / ED Diagnoses ?Final diagnoses:  ?None  ? ? ?Rx / DC Orders ?ED Discharge Orders   ? ? None  ? ?  ? ? ?  ?Quintella Reichert, MD ?02/07/22 [phone] ? ?

## 2022-02-12 ENCOUNTER — Other Ambulatory Visit: Payer: Self-pay

## 2022-02-12 ENCOUNTER — Emergency Department (HOSPITAL_COMMUNITY): Payer: Medicare Other

## 2022-02-12 ENCOUNTER — Encounter (HOSPITAL_COMMUNITY): Payer: Self-pay | Admitting: Emergency Medicine

## 2022-02-12 ENCOUNTER — Emergency Department (HOSPITAL_COMMUNITY)
Admission: EM | Admit: 2022-02-12 | Discharge: 2022-02-13 | Disposition: A | Discharge status: Home / Self Care | Payer: Medicare Other | Attending: Emergency Medicine | Admitting: Emergency Medicine

## 2022-02-12 DIAGNOSIS — I4891 Unspecified atrial fibrillation: Secondary | ICD-10-CM | POA: Insufficient documentation

## 2022-02-12 DIAGNOSIS — I130 Hypertensive heart and chronic kidney disease with heart failure and stage 1 through stage 4 chronic kidney disease, or unspecified chronic kidney disease: Secondary | ICD-10-CM | POA: Diagnosis not present

## 2022-02-12 DIAGNOSIS — Z79899 Other long term (current) drug therapy: Secondary | ICD-10-CM | POA: Insufficient documentation

## 2022-02-12 DIAGNOSIS — S52552A Other extraarticular fracture of lower end of left radius, initial encounter for closed fracture: Secondary | ICD-10-CM

## 2022-02-12 DIAGNOSIS — S52501A Unspecified fracture of the lower end of right radius, initial encounter for closed fracture: Secondary | ICD-10-CM | POA: Insufficient documentation

## 2022-02-12 DIAGNOSIS — N189 Chronic kidney disease, unspecified: Secondary | ICD-10-CM | POA: Diagnosis not present

## 2022-02-12 DIAGNOSIS — I509 Heart failure, unspecified: Secondary | ICD-10-CM | POA: Insufficient documentation

## 2022-02-12 DIAGNOSIS — J449 Chronic obstructive pulmonary disease, unspecified: Secondary | ICD-10-CM | POA: Diagnosis not present

## 2022-02-12 DIAGNOSIS — W010XXA Fall on same level from slipping, tripping and stumbling without subsequent striking against object, initial encounter: Secondary | ICD-10-CM | POA: Insufficient documentation

## 2022-02-12 DIAGNOSIS — S59911A Unspecified injury of right forearm, initial encounter: Secondary | ICD-10-CM | POA: Diagnosis present

## 2022-02-12 DIAGNOSIS — Z7982 Long term (current) use of aspirin: Secondary | ICD-10-CM | POA: Insufficient documentation

## 2022-02-12 MED ORDER — OXYCODONE-ACETAMINOPHEN 5-325 MG PO TABS
2.0000 | ORAL_TABLET | Freq: Once | ORAL | Status: AC
  Administered 2022-02-12: 2 via ORAL
  Filled 2022-02-12: qty 2

## 2022-02-12 NOTE — ED Provider Triage Note (Signed)
Emergency Medicine Provider Triage Evaluation Note ? ?Katie [name] , a 75 y.o. female  was evaluated in triage.  Pt complains of right wrist pain after suffering a fall.  Reports that she suffered a fall today at approximately 6 PM.  Patient states that she was walking on a hill when she lost her footing and slipped.  Patient denies hitting her head or any loss of consciousness.  Patient is not on any blood thinners.  Patient is right-hand dominant. ? ?Denies any headache, numbness, weakness, color change, pallor, neck pain, back pain. ? ?Review of Systems  ?Positive: Wrist pain ?Negative: See above ? ?Physical Exam  ?BP 125/85 (BP Location: Left Arm)   Pulse 83   Temp 98.4 ?F (36.9 ?C) (Oral)   Resp 18   SpO2 98%  ?Gen:   Awake, no distress   ?Resp:  Normal effort  ?MSK:   Moves extremities without difficulty  ?Other:  +2 radial pulse bilaterally.  Sensation intact to all digits of right hand, cap refill less than 2 seconds in all digits of right hand.  Patient has obvious deformity to right wrist.  Full range of motion to right elbow without tenderness or deformity. ? ?Medical Decision Making  ?Medically screening exam initiated at 9:31 PM.  Appropriate orders placed.  Katie [name] was informed that the remainder of the evaluation will be completed by another provider, this initial triage assessment does not replace that evaluation, and the importance of remaining in the ED until their evaluation is complete. ? ?Patient ordered Percocet pain medication, x-ray imaging ordered.  Will notify charge nurse that patient will need to be moved back to room urgently for reduction of fracture. ?  ?Loni Beckwith, PA-C ?02/12/22 2133 ? ?

## 2022-02-12 NOTE — ED Triage Notes (Signed)
Pt stated she had a mechanical fall today and has an obvious deformity to right wrist/arm.  Reports this is not the first time she has fallen. ?

## 2022-02-13 ENCOUNTER — Emergency Department (HOSPITAL_COMMUNITY): Payer: Medicare Other

## 2022-02-13 MED ORDER — MORPHINE SULFATE (PF) 4 MG/ML IV SOLN
4.0000 mg | Freq: Once | INTRAVENOUS | Status: AC
  Administered 2022-02-13: 4 mg via INTRAVENOUS
  Filled 2022-02-13: qty 1

## 2022-02-13 MED ORDER — ONDANSETRON HCL 4 MG/2ML IJ SOLN
4.0000 mg | Freq: Once | INTRAMUSCULAR | Status: AC
  Administered 2022-02-13: 4 mg via INTRAVENOUS
  Filled 2022-02-13: qty 2

## 2022-02-13 MED ORDER — OXYCODONE-ACETAMINOPHEN 5-325 MG PO TABS: 1.0000 | ORAL_TABLET | Freq: Four times a day (QID) | ORAL | 0 refills | Status: DC | PRN

## 2022-02-13 MED ORDER — LIDOCAINE HCL 2 % IJ SOLN
20.0000 mL | Freq: Once | INTRAMUSCULAR | Status: AC
  Administered 2022-02-13: 400 mg
  Filled 2022-02-13: qty 20

## 2022-02-13 NOTE — Progress Notes (Signed)
Orthopedic Tech Progress Note ?Patient Details:  ?Katie [name] ?[birth year] ?[id number] ? ?Ortho Devices ?Type of Ortho Device: Sugartong splint ?Ortho Device/Splint Location: RUE ?Ortho Device/Splint Interventions: Ordered, Adjustment, Application ?  ?Post Interventions ?Patient Tolerated: Fair ?Instructions Provided: Adjustment of device, Care of device ?Assisted provider in splint application by wrapping while they held traction and splint in place following reduction of the wrist. ? ?[place] ?02/13/2022, 1:39 AM ? ?

## 2022-02-13 NOTE — Discharge Instructions (Signed)
You were seen in the ER today for your wrist injury. You have multiple fractures within the wrist.  This was splinted and reduced in the emergency department.  Please follow-up with Dr. Apolonio Schneiders listed below next week.  Call his office first thing tomorrow to schedule a follow-up appointment for next week for reevaluation and definitive management of your pain.  Your pain medication has been prescribed to the pharmacy listed below.  You may use Tylenol as needed at home for your pain and may use the prescribed pain medication as needed for severe breakthrough pain.  Please keep the splint on at all times, you may shower like normal but please cover the splint with a trash bag so that it does not get wet.  Return to the ER if you develop any numbness, tingling, weakness in the hand, or any other new severe symptom.Marland Kitchen  ?

## 2022-02-13 NOTE — ED Notes (Signed)
Pt given graham cracker, saltines, and water ?

## 2022-02-13 NOTE — ED Provider Notes (Addendum)
?[facility] ?Provider Note ? ? ?CSN: [id number] ?Arrival date & time: 02/12/22  1905 ? ?  ? ?History ? ?Chief Complaint  ?Patient presents with  ? Wrist Pain  ? ? ?Katie [name] is a 76 y.o. female  who presents after mechanical fall today where she was trying to walk up a sloped hill in her flip-flops and slipped landing on her outstretched right wrist with deformity and significant pain.  History of multiple falls in the past.  Denies any numbness or tingling in the hand but does endorse severe pain that was temporarily improved with Percocet administered in triage. ? ?I personally reviewed this patient's medical records.  She is history of A-fib, AAA without rupture, CKD, COPD, hyperlipidemia, hypertension, CHF.  She is not anticoagulated.  Patient does also have history of polysubstance abuse primarily with cocaine use. ? ?HPI ? ?  ? ?Home Medications ?Prior to Admission medications   ?Medication Sig Start Date End Date Taking? Authorizing Provider  ?acetaminophen (TYLENOL) 500 MG tablet Take 500 mg by mouth every 8 (eight) hours as needed for mild pain.    [provider]  ?albuterol (VENTOLIN HFA) 108 (90 Base) MCG/ACT inhaler Inhale 2 puffs into the lungs every 6 (six) hours as needed for wheezing or shortness of breath. 02/08/20   Brunetta Jeans, PA-C  ?aspirin EC 81 MG tablet Take 1 tablet (81 mg total) by mouth daily. 09/29/18   Bonnielee Haff, MD  ?famotidine (PEPCID) 20 MG tablet Take 1 tablet (20 mg total) by mouth daily. 01/22/22   Godfrey Pick, MD  ?loperamide (IMODIUM) 2 MG capsule Take 1 capsule (2 mg total) by mouth every 12 (twelve) hours as needed for diarrhea or loose stools. 01/17/22   Kayleen Memos, DO  ?metoCLOPramide (REGLAN) 10 MG tablet Take 1 tablet (10 mg total) by mouth every 8 (eight) hours as needed for up to 20 doses for nausea. 01/22/22   Godfrey Pick, MD  ?metoprolol tartrate (LOPRESSOR) 25 MG tablet Take 1 tablet (25 mg total) by mouth  2 (two) times daily. 04/23/21   Kennyth Arnold, FNP  ?Misc. Devices (PULSE OXIMETER FOR FINGER) MISC 1 Device by Does not apply route as needed. 03/11/19   Brunetta Jeans, PA-C  ?ondansetron (ZOFRAN) 4 MG tablet Take 4 mg by mouth daily. 01/31/22   [provider]  ?OVER THE COUNTER MEDICATION Take 1 tablet by mouth at bedtime. Blue tablet. [facility] or [facility] sleeping aid    [provider]  ?polyethylene glycol (MIRALAX) 17 g packet Take 17 g by mouth daily. 01/17/22   Kayleen Memos, DO  ?PROLIA 60 MG/ML SOSY injection Inject 60 mg into the skin every 6 (six) months. 05/16/21   [provider]  ?simethicone (GAS-X) 80 MG chewable tablet Chew 1 tablet (80 mg total) by mouth 4 (four) times daily as needed for up to 5 days for flatulence. ?Patient not taking: Reported on 02/07/2022 01/17/22 03/29/22  Kayleen Memos, DO  ?TRELEGY ELLIPTA 200-62.5-25 MCG/INH AEPB Inhale 1 puff into the lungs daily. 07/17/21   [provider]  ?valACYclovir (VALTREX) 1000 MG tablet Take 1,000 mg by mouth 2 (two) times daily. 01/30/22   [provider]  ?   ? ?Allergies    ?Ace inhibitors   ? ?Review of Systems   ?Review of Systems  ?Musculoskeletal:   ?     Wrist deformity  ? ?Physical Exam ?Updated Vital Signs ?BP (!) 142/90  Pulse 75   Temp 98.4 ?F (36.9 ?C) (Oral)   Resp 17   Ht '5\' 1"'$  (1.549 m)   Wt 73 kg   SpO2 100%   BMI 30.41 kg/m?  ?Physical Exam ?Vitals and nursing note reviewed.  ?HENT:  ?   Head: Normocephalic and atraumatic.  ?   Mouth/Throat:  ?   Mouth: Mucous membranes are moist.  ?   Pharynx: No oropharyngeal exudate or posterior oropharyngeal erythema.  ?Eyes:  ?   General:     ?   Right eye: No discharge.     ?   Left eye: No discharge.  ?   Extraocular Movements: Extraocular movements intact.  ?   Conjunctiva/sclera: Conjunctivae normal.  ?   Pupils: Pupils are equal, round, and reactive to light.  ?Cardiovascular:  ?   Rate and Rhythm: Normal rate and regular rhythm.  ?    Pulses: Normal pulses.  ?Pulmonary:  ?   Effort: Pulmonary effort is normal.  ?   Breath sounds: Normal breath sounds.  ?Chest:  ?   Chest wall: No mass, lacerations, deformity, swelling, tenderness, crepitus or edema.  ?Abdominal:  ?   Palpations: Abdomen is soft.  ?   Tenderness: There is no abdominal tenderness.  ?Musculoskeletal:  ?   Right shoulder: Normal.  ?   Left shoulder: Normal.  ?   Right upper arm: Normal.  ?   Left upper arm: Normal.  ?   Right elbow: Normal.  ?   Left elbow: Normal.  ?   Right forearm: Tenderness present. No swelling, deformity, lacerations or bony tenderness.  ?   Left forearm: Normal.  ?   Right wrist: Swelling, deformity, tenderness and bony tenderness present.  ?   Left wrist: Normal.  ?   Right hand: Normal. Normal capillary refill.  ?   Left hand: Normal.  ?   Cervical back: Neck supple.  ?   Right lower leg: No edema.  ?   Left lower leg: No edema.  ?   Comments: Patient with 2+ radial pulse, normal capillary refill and sensation in all digits.  Please see photo below for evidence of wrist deformity on the right.  ?Skin: ?   General: Skin is warm and dry.  ?   Capillary Refill: Capillary refill takes less than 2 seconds.  ?Neurological:  ?   General: No focal deficit present.  ?   Mental Status: She is alert and oriented to person, place, and time. Mental status is at baseline.  ?   Sensory: Sensation is intact.  ?   Motor: Motor function is intact.  ?Psychiatric:     ?   Mood and Affect: Mood normal.  ? ? ? ?ED Results / Procedures / Treatments   ?Labs ?(all labs ordered are listed, but only abnormal results are displayed) ?Labs Reviewed - No data to display ? ?EKG ?None ? ?Radiology ?DG Wrist 2 Views Right ? ?Result Date: 02/13/2022 ?CLINICAL DATA:  Post reduction EXAM: RIGHT WRIST - 2 VIEW COMPARISON:  02/12/2022 FINDINGS: Interim casting. Comminuted intra-articular distal radius fracture with significant impaction. Decreased radial displacement of the distal fracture  fragment. Displaced ulnar styloid process fracture. Triquetral fracture fragment is not as well seen on these images. IMPRESSION: Comminuted intra-articular distal radius fracture with significant impaction but overall decreased angulation compared to prior. Displaced ulnar styloid process fracture. Electronically Signed   By: Donavan Foil M.D.   On: 02/13/2022 01:58  ? ?DG Wrist Complete  Right ? ?Result Date: 02/12/2022 ?CLINICAL DATA:  Fall, right wrist deformity EXAM: RIGHT WRIST - COMPLETE 3+ VIEW COMPARISON:  None. FINDINGS: There is an acute, comminuted, transverse fracture of the distal right radial metaphysis with impaction, 1/2 shaft with medial and dorsal displacement, and approximately 45 degrees radial angulation of the distal fracture fragment. Radiocarpal articulation appears preserved. Resultant ulnar positive variance. A sliver like ossific fragment is seen dorsal to the proximal carpal row which likely represents a triquetral avulsion fracture. A transverse fracture seen at the base of the ulnar styloid with 9 mm radial displacement of the ulnar styloid. Extensive surrounding soft tissue swelling noted. IMPRESSION: Comminuted, impacted, markedly angulated fracture of the distal right radius, triquetral avulsion fracture, and a fracture of the base of the ulnar styloid with radial displacement as described above. Electronically Signed   By: Fidela Salisbury M.D.   On: 02/12/2022 22:11  ? ?DG Hand Complete Right ? ?Result Date: 02/12/2022 ?CLINICAL DATA:  Fall, right wrist deformity EXAM: RIGHT HAND - COMPLETE 3+ VIEW COMPARISON:  None. FINDINGS: Fractures of the distal right radius and ulna are better described on accompanying radiographs of the right wrist. Sliver like fracture fragment again seen dorsal to the proximal carpal row most in keeping with a triquetral avulsion fracture. No other fracture or dislocation identified involving the right hand. Joint spaces of the right hand appear preserved.  IMPRESSION: No fracture or dislocation involving the right hand. Obvious fractures of the right distal radius, ulna, and triquetrum are better described on accompanying radiographs of the right wrist. Electronical

## 2022-02-14 ENCOUNTER — Encounter (HOSPITAL_COMMUNITY): Payer: Self-pay | Admitting: Orthopedic Surgery

## 2022-02-14 NOTE — Progress Notes (Signed)
Unable to reach patient via phone. VM box was full.  Will have Tye Maryland, RN or Manuela Schwartz, RN to follow up with patient over the weekend with information and instructions for DOS. ? ?PCP - [name], PA-C ?Cardiologist - Dr Skeet Latch ?Pulmonary - Eric Form, NP ? ?Chest x-ray - 01/09/22 (1V) ?EKG - 02/13/22 ?Stress Test - 04/18/16 ?ECHO - 01/11/22 ?Cardiac Cath - 03/22/19 ? ?ICD Pacemaker/Loop - n/a ? ?Sleep Study -  n/a ?CPAP - none ? ?Aspirin Instructions: Follow your surgeon's instructions on when to stop aspirin prior to surgery,  If no instructions were given by your surgeon then you will need to call the office for those instructions. ? ?ERAS: Clear liquids til 12 Noon DOS ? ?Anesthesia review: Yes, Reviewed patient history with Dr Suzette Battiest.  [facility] for surgery. ? ?STOP now taking any Aspirin (unless otherwise instructed by your surgeon), Aleve, Naproxen, Ibuprofen, Motrin, Advil, Goody's, BC's, all herbal medications, fish oil, and all vitamins.  ? ?

## 2022-02-14 NOTE — H&P (Signed)
? ?Katie [name] is an 76 y.o. female.   ?Chief Complaint: RIGHT WRIST PAIN  ? ?HPI: Katie [name] is a 76 year old right-hand dominant female who slipped and fell on 02/13/22 catching herself with the right arm.  She was seen in the emergency department where a reduction was attempted.  She was put into a sugar tong splint. ?She continues to have weakness, numbness, tingling, swelling, and pain.  She was seen in our office for further evaluation.  Discussed the reason and rationale for surgical intervention. ?She is here today for surgery. ?She denies chest pain, shortness of breath, fever, chills, nausea, vomiting, diarrhea. ? ? ?Past Medical History:  ?Diagnosis Date  ? AAA (abdominal aortic aneurysm) ([place])   ? Arthritis   ? Cholelithiasis 2011  ? s/p cholescystectomy   ? Chronic diastolic congestive heart failure ([place])   ? COPD (chronic obstructive pulmonary disease) ([place])   ? Emphysema   ? GERD (gastroesophageal reflux disease)   ? History of heartburn   ? History of renal cell carcinoma   ? Hypercholesteremia   ? Hypertension   ? Hypertension   ? Mitral regurgitation   ? Pneumonia 1980's  ? Renal cell carcinoma 01/2010  ? s/p right radical nephrectomy 01/2010,  followed by [facility]  ? S/P minimally invasive mitral valve repair 04/20/2019  ? Complex valvuloplasty including triangular resection of flail segment of posterior leaflet, artificial Gore-tex neochord placement x6 and 30 mm Sorin Memo 4D ring annuloplasty via right mini thoracotomy approach  ? Shortness of breath   ? Tuberculosis   ? Tests positive for PPD. dad had h/o TB.  ? ? ?Past Surgical History:  ?Procedure Laterality Date  ? ABDOMINAL AORTIC ENDOVASCULAR STENT GRAFT N/A 01/10/2022  ? Procedure: ABDOMINAL AORTIC ENDOVASCULAR STENT GRAFT;  Surgeon: Marty Heck, MD;  Location: [facility];  Service: Vascular;  Laterality: N/A;  ? APPENDECTOMY  1970's  ? BUBBLE STUDY  03/18/2019  ? Procedure: BUBBLE STUDY;  Surgeon: Fay Records, MD;  Location: [facility];  Service: Cardiovascular;;  ? CARDIAC CATHETERIZATION  09/2011  ? CHOLECYSTECTOMY  01/2010  ? DIAGNOSTIC LAPAROSCOPY    ? KIDNEY SURGERY    ? LEFT AND RIGHT HEART CATHETERIZATION WITH CORONARY ANGIOGRAM N/A 09/30/2011  ? Procedure: LEFT AND RIGHT HEART CATHETERIZATION WITH CORONARY ANGIOGRAM;  Surgeon: Candee Furbish, MD;  Location: [facility] C[facility];  Service: Cardiovascular;  Laterality: N/A;  ? MITRAL VALVE REPAIR Right 04/20/2019  ? Procedure: MINIMALLY INVASIVE MITRAL VALVE REPAIR (MVR) USING MEMO 4D SIZE 30;  Surgeon: Rexene Alberts, MD;  Location: [facility];  Service: Open Heart Surgery;  Laterality: Right;  ? MULTIPLE EXTRACTIONS WITH ALVEOLOPLASTY  10/06/2011  ? Procedure: MULTIPLE EXTRACION WITH ALVEOLOPLASTY;  Surgeon: Lenn Cal, DDS;  Location: [facility];  Service: Oral Surgery;  Laterality: N/A;  Multiple extraction of tooth #'s 1, 6, 8, 10, 11, 22, 23, 26, 27, 28, 29 with alveoloplasty and Upper right buccal exostoses reductions.  ? NEPHRECTOMY RADICAL  01/2010  ? right   ? OVARIAN CYST REMOVAL  1970's  ? "went through belly button"  ? RIGHT/LEFT HEART CATH AND CORONARY ANGIOGRAPHY N/A 03/22/2019  ? Procedure: RIGHT/LEFT HEART CATH AND CORONARY ANGIOGRAPHY;  Surgeon: [name], MD;  Location: [facility];  Service: Cardiovascular;  Laterality: N/A;  ? TEE WITHOUT CARDIOVERSION  10/01/2011  ? Procedure: TRANSESOPHAGEAL ECHOCARDIOGRAM (TEE);  Surgeon: [name];  Location: [facility];  Service: Cardiovascular;  Laterality: N/A;  ? TEE WITHOUT  CARDIOVERSION N/A 03/18/2019  ? Procedure: TRANSESOPHAGEAL ECHOCARDIOGRAM (TEE);  Surgeon: Fay Records, MD;  Location: [facility];  Service: Cardiovascular;  Laterality: N/A;  ? TEE WITHOUT CARDIOVERSION N/A 04/20/2019  ? Procedure: TRANSESOPHAGEAL ECHOCARDIOGRAM (TEE);  Surgeon: Rexene Alberts, MD;  Location: [facility];  Service: Open Heart Surgery;  Laterality: N/A;  ? TUBAL LIGATION  1970's  ? US ECHOCARDIOGRAPHY  09/2011  ? ? ?Family  History  ?Problem Relation Age of Onset  ? COPD Mother   ?     DECEASED/SMOKED  ? Diabetes Brother   ? Diabetes Brother   ? ?Social History:  reports that she has been smoking cigarettes. She has a 53.00 pack-year smoking history. She has been exposed to tobacco smoke. She has never used smokeless tobacco. She reports current alcohol use. She reports that she does not currently use drugs after having used the following drugs: Cocaine. ? ?Allergies: No Known Allergies ? ?No medications prior to admission.  ? ? ?No results found for this or any previous visit (from the past 48 hour(s)). ?DG Wrist 2 Views Right ? ?Result Date: 02/13/2022 ?CLINICAL DATA:  Post reduction EXAM: RIGHT WRIST - 2 VIEW COMPARISON:  02/12/2022 FINDINGS: Interim casting. Comminuted intra-articular distal radius fracture with significant impaction. Decreased radial displacement of the distal fracture fragment. Displaced ulnar styloid process fracture. Triquetral fracture fragment is not as well seen on these images. IMPRESSION: Comminuted intra-articular distal radius fracture with significant impaction but overall decreased angulation compared to prior. Displaced ulnar styloid process fracture. Electronically Signed   By: Donavan Foil M.D.   On: 02/13/2022 01:58  ? ?DG Wrist Complete Right ? ?Result Date: 02/12/2022 ?CLINICAL DATA:  Fall, right wrist deformity EXAM: RIGHT WRIST - COMPLETE 3+ VIEW COMPARISON:  None. FINDINGS: There is an acute, comminuted, transverse fracture of the distal right radial metaphysis with impaction, 1/2 shaft with medial and dorsal displacement, and approximately 45 degrees radial angulation of the distal fracture fragment. Radiocarpal articulation appears preserved. Resultant ulnar positive variance. A sliver like ossific fragment is seen dorsal to the proximal carpal row which likely represents a triquetral avulsion fracture. A transverse fracture seen at the base of the ulnar styloid with 9 mm radial displacement  of the ulnar styloid. Extensive surrounding soft tissue swelling noted. IMPRESSION: Comminuted, impacted, markedly angulated fracture of the distal right radius, triquetral avulsion fracture, and a fracture of the base of the ulnar styloid with radial displacement as described above. Electronically Signed   By: Fidela Salisbury M.D.   On: 02/12/2022 22:11  ? ?DG Hand Complete Right ? ?Result Date: 02/12/2022 ?CLINICAL DATA:  Fall, right wrist deformity EXAM: RIGHT HAND - COMPLETE 3+ VIEW COMPARISON:  None. FINDINGS: Fractures of the distal right radius and ulna are better described on accompanying radiographs of the right wrist. Sliver like fracture fragment again seen dorsal to the proximal carpal row most in keeping with a triquetral avulsion fracture. No other fracture or dislocation identified involving the right hand. Joint spaces of the right hand appear preserved. IMPRESSION: No fracture or dislocation involving the right hand. Obvious fractures of the right distal radius, ulna, and triquetrum are better described on accompanying radiographs of the right wrist. Electronically Signed   By: Fidela Salisbury M.D.   On: 02/12/2022 22:14   ? ?ROS NO RECENT ILLNESSES OR HOSPITALIZATIONS ? ?There were no vitals taken for this visit. ?Physical Exam  ?General Appearance:  Alert, cooperative, no distress, appears stated age  ?Head:  Normocephalic, without obvious  abnormality, atraumatic  ?Eyes:  Pupils equal, conjunctiva/corneas clear,   ?   ?   ?Throat: Lips, mucosa, and tongue normal; teeth and gums normal  ?Neck: No visible masses  ?   ?Lungs:   respirations unlabored  ?Chest Wall:  No tenderness or deformity  ?Heart:  Regular rate and rhythm,  ?Abdomen:   Soft, non-tender,   ?   ?   ?Extremities:   ?Pulses: 2+ and symmetric  ?Skin: Skin color, texture, turgor normal, no rashes or lesions  ?   ?Neurologic: Normal  ? ? ? ?Assessment/Plan ?RIGHT DISTAL RADIUS DISPLACED INTRAARTICULAR FRACTURE ?   - RIGHT DISTAL RADIUS OPEN  REDUCTION AND INTERNAL FIXATION WITH REPAIR AS INDICATED ? ?R/B/A DISCUSSED WITH PT IN OFFICE.  ?PT VOICED UNDERSTANDING OF PLAN ?CONSENT SIGNED DAY OF SURGERY ?PT SEEN AND EXAMINED PRIOR TO OPERAT

## 2022-02-15 ENCOUNTER — Encounter (HOSPITAL_COMMUNITY): Payer: Self-pay | Admitting: Orthopedic Surgery

## 2022-02-15 NOTE — Progress Notes (Signed)
Notified pt. Of surgical instructions. ?PCP: Dr. Allegra Grana @ [facility] in [place]. ?

## 2022-02-17 ENCOUNTER — Ambulatory Visit (HOSPITAL_BASED_OUTPATIENT_CLINIC_OR_DEPARTMENT_OTHER): Payer: Medicare Other | Admitting: Anesthesiology

## 2022-02-17 ENCOUNTER — Other Ambulatory Visit: Payer: Self-pay

## 2022-02-17 ENCOUNTER — Encounter (HOSPITAL_COMMUNITY)
Admission: RE | Disposition: A | Discharge status: Home / Self Care | Payer: Self-pay | Source: Home / Self Care | Attending: Orthopedic Surgery

## 2022-02-17 ENCOUNTER — Ambulatory Visit (HOSPITAL_COMMUNITY): Payer: Medicare Other | Admitting: Anesthesiology

## 2022-02-17 ENCOUNTER — Ambulatory Visit (HOSPITAL_COMMUNITY)
Admission: RE | Admit: 2022-02-17 | Discharge: 2022-02-17 | Disposition: A | Discharge status: Home / Self Care | Payer: Medicare Other | Attending: Orthopedic Surgery | Admitting: Orthopedic Surgery

## 2022-02-17 ENCOUNTER — Ambulatory Visit (HOSPITAL_COMMUNITY): Payer: Medicare Other

## 2022-02-17 DIAGNOSIS — F17210 Nicotine dependence, cigarettes, uncomplicated: Secondary | ICD-10-CM | POA: Diagnosis not present

## 2022-02-17 DIAGNOSIS — M1990 Unspecified osteoarthritis, unspecified site: Secondary | ICD-10-CM | POA: Insufficient documentation

## 2022-02-17 DIAGNOSIS — S52501A Unspecified fracture of the lower end of right radius, initial encounter for closed fracture: Secondary | ICD-10-CM

## 2022-02-17 DIAGNOSIS — S52571A Other intraarticular fracture of lower end of right radius, initial encounter for closed fracture: Secondary | ICD-10-CM | POA: Insufficient documentation

## 2022-02-17 DIAGNOSIS — I739 Peripheral vascular disease, unspecified: Secondary | ICD-10-CM | POA: Diagnosis not present

## 2022-02-17 DIAGNOSIS — I110 Hypertensive heart disease with heart failure: Secondary | ICD-10-CM | POA: Diagnosis not present

## 2022-02-17 DIAGNOSIS — J439 Emphysema, unspecified: Secondary | ICD-10-CM | POA: Insufficient documentation

## 2022-02-17 DIAGNOSIS — I509 Heart failure, unspecified: Secondary | ICD-10-CM

## 2022-02-17 DIAGNOSIS — I5032 Chronic diastolic (congestive) heart failure: Secondary | ICD-10-CM | POA: Insufficient documentation

## 2022-02-17 DIAGNOSIS — W010XXA Fall on same level from slipping, tripping and stumbling without subsequent striking against object, initial encounter: Secondary | ICD-10-CM | POA: Diagnosis not present

## 2022-02-17 HISTORY — PX: ORIF WRIST FRACTURE: SHX2133

## 2022-02-17 SURGERY — OPEN REDUCTION INTERNAL FIXATION (ORIF) WRIST FRACTURE
Anesthesia: Monitor Anesthesia Care | Site: Wrist | Laterality: Right

## 2022-02-17 MED ORDER — ACETAMINOPHEN 10 MG/ML IV SOLN: 1000.0000 mg | Freq: Once | INTRAVENOUS | Status: DC | PRN

## 2022-02-17 MED ORDER — LIDOCAINE 2% (20 MG/ML) 5 ML SYRINGE
INTRAMUSCULAR | Status: AC
  Filled 2022-02-17: qty 10

## 2022-02-17 MED ORDER — LACTATED RINGERS IV SOLN: INTRAVENOUS | Status: DC

## 2022-02-17 MED ORDER — ACETAMINOPHEN 160 MG/5ML PO SOLN: 1000.0000 mg | Freq: Once | ORAL | Status: DC | PRN

## 2022-02-17 MED ORDER — FENTANYL CITRATE (PF) 250 MCG/5ML IJ SOLN
INTRAMUSCULAR | Status: AC
  Filled 2022-02-17: qty 5

## 2022-02-17 MED ORDER — EPHEDRINE 5 MG/ML INJ
INTRAVENOUS | Status: AC
  Filled 2022-02-17: qty 5

## 2022-02-17 MED ORDER — 0.9 % SODIUM CHLORIDE (POUR BTL) OPTIME
TOPICAL | Status: DC | PRN
  Administered 2022-02-17: 1000 mL

## 2022-02-17 MED ORDER — ONDANSETRON HCL 4 MG/2ML IJ SOLN
INTRAMUSCULAR | Status: AC
  Filled 2022-02-17: qty 6

## 2022-02-17 MED ORDER — FENTANYL CITRATE (PF) 100 MCG/2ML IJ SOLN: 25.0000 ug | INTRAMUSCULAR | Status: DC | PRN

## 2022-02-17 MED ORDER — BUPIVACAINE-EPINEPHRINE (PF) 0.5% -1:200000 IJ SOLN
INTRAMUSCULAR | Status: DC | PRN
  Administered 2022-02-17: 30 mL via PERINEURAL

## 2022-02-17 MED ORDER — OXYCODONE HCL 5 MG PO TABS: 5.0000 mg | ORAL_TABLET | Freq: Once | ORAL | Status: DC | PRN

## 2022-02-17 MED ORDER — LIDOCAINE HCL (PF) 2 % IJ SOLN
INTRAMUSCULAR | Status: DC | PRN
  Administered 2022-02-17: 100 mg via PERINEURAL

## 2022-02-17 MED ORDER — CEFAZOLIN SODIUM-DEXTROSE 2-4 GM/100ML-% IV SOLN
2.0000 g | INTRAVENOUS | Status: AC
  Administered 2022-02-17: 2 g via INTRAVENOUS
  Filled 2022-02-17: qty 100

## 2022-02-17 MED ORDER — ONDANSETRON HCL 4 MG/2ML IJ SOLN
INTRAMUSCULAR | Status: DC | PRN
  Administered 2022-02-17: 4 mg via INTRAVENOUS

## 2022-02-17 MED ORDER — LIDOCAINE 2% (20 MG/ML) 5 ML SYRINGE
INTRAMUSCULAR | Status: DC | PRN
  Administered 2022-02-17: 60 mg via INTRAVENOUS

## 2022-02-17 MED ORDER — ACETAMINOPHEN 500 MG PO TABS: 1000.0000 mg | ORAL_TABLET | Freq: Once | ORAL | Status: DC | PRN

## 2022-02-17 MED ORDER — PROPOFOL 500 MG/50ML IV EMUL
INTRAVENOUS | Status: DC | PRN
  Administered 2022-02-17: 100 ug/kg/min via INTRAVENOUS

## 2022-02-17 MED ORDER — PHENYLEPHRINE 80 MCG/ML (10ML) SYRINGE FOR IV PUSH (FOR BLOOD PRESSURE SUPPORT)
PREFILLED_SYRINGE | INTRAVENOUS | Status: AC
  Filled 2022-02-17: qty 10

## 2022-02-17 MED ORDER — ORAL CARE MOUTH RINSE: 15.0000 mL | Freq: Once | OROMUCOSAL | Status: AC

## 2022-02-17 MED ORDER — HEPARIN SODIUM (PORCINE) 1000 UNIT/ML IJ SOLN
INTRAMUSCULAR | Status: AC
  Filled 2022-02-17: qty 20

## 2022-02-17 MED ORDER — CHLORHEXIDINE GLUCONATE 0.12 % MT SOLN
15.0000 mL | Freq: Once | OROMUCOSAL | Status: AC
  Administered 2022-02-17: 15 mL via OROMUCOSAL
  Filled 2022-02-17: qty 15

## 2022-02-17 MED ORDER — FENTANYL CITRATE (PF) 250 MCG/5ML IJ SOLN
INTRAMUSCULAR | Status: DC | PRN
  Administered 2022-02-17 (×2): 50 ug via INTRAVENOUS

## 2022-02-17 MED ORDER — PHENYLEPHRINE 80 MCG/ML (10ML) SYRINGE FOR IV PUSH (FOR BLOOD PRESSURE SUPPORT)
PREFILLED_SYRINGE | INTRAVENOUS | Status: DC | PRN
  Administered 2022-02-17: 120 ug via INTRAVENOUS

## 2022-02-17 MED ORDER — OXYCODONE HCL 5 MG/5ML PO SOLN: 5.0000 mg | Freq: Once | ORAL | Status: DC | PRN

## 2022-02-17 SURGICAL SUPPLY — 66 items
BAG COUNTER SPONGE SURGICOUNT (BAG) ×2 IMPLANT
BAG SPNG CNTER NS LX DISP (BAG) ×1
BIT DRILL 2.2 SS TIBIAL (BIT) ×1 IMPLANT
BNDG CMPR 9X4 STRL LF SNTH (GAUZE/BANDAGES/DRESSINGS) ×1
BNDG ELASTIC 3X5.8 VLCR STR LF (GAUZE/BANDAGES/DRESSINGS) ×2 IMPLANT
BNDG ELASTIC 4X5.8 VLCR STR LF (GAUZE/BANDAGES/DRESSINGS) ×2 IMPLANT
BNDG ESMARK 4X9 LF (GAUZE/BANDAGES/DRESSINGS) ×2 IMPLANT
BNDG GAUZE ELAST 4 BULKY (GAUZE/BANDAGES/DRESSINGS) ×2 IMPLANT
CORD BIPOLAR FORCEPS 12FT (ELECTRODE) ×2 IMPLANT
COVER SURGICAL LIGHT HANDLE (MISCELLANEOUS) ×2 IMPLANT
CUFF TOURN SGL QUICK 18X4 (TOURNIQUET CUFF) ×2 IMPLANT
CUFF TOURN SGL QUICK 24 (TOURNIQUET CUFF)
CUFF TRNQT CYL 24X4X16.5-23 (TOURNIQUET CUFF) IMPLANT
DRAPE OEC MINIVIEW 54X84 (DRAPES) ×2 IMPLANT
DRIVER BIT SQUARE 1.7/2.2 (TRAUMA) ×1 IMPLANT
DRSG EMULSION OIL 3X3 NADH (GAUZE/BANDAGES/DRESSINGS) ×1 IMPLANT
ELECT REM PT RETURN 9FT ADLT (ELECTROSURGICAL)
ELECTRODE REM PT RTRN 9FT ADLT (ELECTROSURGICAL) IMPLANT
GLOVE BIOGEL PI IND STRL 8.5 (GLOVE) ×1 IMPLANT
GLOVE BIOGEL PI INDICATOR 8.5 (GLOVE) ×1
GLOVE SURG ORTHO 8.0 STRL STRW (GLOVE) ×2 IMPLANT
GOWN STRL REUS W/ TWL LRG LVL3 (GOWN DISPOSABLE) ×3 IMPLANT
GOWN STRL REUS W/ TWL XL LVL3 (GOWN DISPOSABLE) ×1 IMPLANT
GOWN STRL REUS W/TWL LRG LVL3 (GOWN DISPOSABLE) ×6
GOWN STRL REUS W/TWL XL LVL3 (GOWN DISPOSABLE) ×2
K-WIRE 1.6 (WIRE) ×4
K-WIRE FX5X1.6XNS BN SS (WIRE) ×2
KIT BASIN OR (CUSTOM PROCEDURE TRAY) ×2 IMPLANT
KIT TURNOVER KIT B (KITS) ×2 IMPLANT
KWIRE FX5X1.6XNS BN SS (WIRE) IMPLANT
NS IRRIG 1000ML POUR BTL (IV SOLUTION) ×2 IMPLANT
PACK ORTHO EXTREMITY (CUSTOM PROCEDURE TRAY) ×2 IMPLANT
PAD ARMBOARD 7.5X6 YLW CONV (MISCELLANEOUS) ×4 IMPLANT
PAD CAST 3X4 CTTN HI CHSV (CAST SUPPLIES) IMPLANT
PAD CAST 4YDX4 CTTN HI CHSV (CAST SUPPLIES) ×1 IMPLANT
PADDING CAST COTTON 3X4 STRL (CAST SUPPLIES) ×2
PADDING CAST COTTON 4X4 STRL (CAST SUPPLIES) ×2
PEG LOCKING SMOOTH 2.2X18 (Peg) ×1 IMPLANT
PEG LOCKING SMOOTH 2.2X20 (Screw) ×2 IMPLANT
PEG LOCKING SMOOTH 2.2X22 (Screw) ×1 IMPLANT
PLATE DVR CROSSLOCK STD RT (Plate) ×1 IMPLANT
PUTTY DBM STAGRAFT PLUS 2CC (Putty) ×1 IMPLANT
SCREW LOCK 14X2.7X 3 LD TPR (Screw) IMPLANT
SCREW LOCK 16X2.7X 3 LD TPR (Screw) IMPLANT
SCREW LOCK 18X2.7X 3 LD TPR (Screw) IMPLANT
SCREW LOCK 22X2.7X 3 LD TPR (Screw) IMPLANT
SCREW LOCK 24X2.7X3 LD THRD (Screw) IMPLANT
SCREW LOCKING 2.7X14 (Screw) ×4 IMPLANT
SCREW LOCKING 2.7X15MM (Screw) ×2 IMPLANT
SCREW LOCKING 2.7X16 (Screw) ×2 IMPLANT
SCREW LOCKING 2.7X18 (Screw) ×2 IMPLANT
SCREW LOCKING 2.7X22MM (Screw) ×4 IMPLANT
SCREW LOCKING 2.7X24MM (Screw) ×2 IMPLANT
SCREW NLOCK 26X2.7X3 LD THRD (Screw) IMPLANT
SCREW NONLOCK 2.7X26MM (Screw) ×2 IMPLANT
SOAP 2 % CHG 4 OZ (WOUND CARE) ×2 IMPLANT
SPLINT FIBERGLASS 3X35 (CAST SUPPLIES) ×1 IMPLANT
SUT MNCRL AB 4-0 PS2 18 (SUTURE) ×1 IMPLANT
SUT PROLENE 4 0 PS 2 18 (SUTURE) ×1 IMPLANT
SUT VIC AB 2-0 FS1 27 (SUTURE) ×1 IMPLANT
SUT VICRYL 4-0 PS2 18IN ABS (SUTURE) IMPLANT
SYR CONTROL 10ML LL (SYRINGE) IMPLANT
TOWEL GREEN STERILE (TOWEL DISPOSABLE) ×2 IMPLANT
TOWEL GREEN STERILE FF (TOWEL DISPOSABLE) ×2 IMPLANT
TUBE CONNECTING 12X1/4 (SUCTIONS) ×2 IMPLANT
WATER STERILE IRR 1000ML POUR (IV SOLUTION) ×2 IMPLANT

## 2022-02-17 NOTE — Anesthesia Preprocedure Evaluation (Addendum)
Anesthesia Evaluation  ?Patient identified by MRN, date of birth, ID band ?Patient awake ? ? ? ?Reviewed: ?Allergy & Precautions, NPO status , Patient's Chart, lab work & pertinent test results ? ?Airway ?Mallampati: II ? ?TM Distance: >3 FB ?Neck ROM: Full ? ? ? Dental ? ?(+) Dental Advisory Given ?  ?Pulmonary ?COPD,  COPD inhaler, Current Smoker and Patient abstained from smoking.,  ?  ?breath sounds clear to auscultation ? ? ? ? ? ? Cardiovascular ?hypertension, Pt. on medications and Pt. on home beta blockers ?+ Peripheral Vascular Disease (AAA) and +CHF  ?+ dysrhythmias Atrial Fibrillation + Valvular Problems/Murmurs (MS, MG 85mHg, s/p MVR 07/20) MR  ?Rhythm:Regular  ? ?  ?Neuro/Psych ?Anxiety Depression   ? GI/Hepatic ?GERD  ,(+)  ?  ? substance abuse ? cocaine use,   ?Endo/Other  ?negative endocrine ROS ? Renal/GU ?negative Renal ROS  ?negative genitourinary ?  ?Musculoskeletal ? ?(+) Arthritis , Osteoarthritis,  S/p fall with wrist fx  ? Abdominal ?  ?Peds ? Hematology ?negative hematology ROS ?(+)   ?Anesthesia Other Findings ? ? Reproductive/Obstetrics ? ?  ? ? ? ? ? ? ? ? ? ? ? ? ? ?  ?  ? ? ? ? ? ? ?Anesthesia Physical ?Anesthesia Plan ? ?ASA: 3 ? ?Anesthesia Plan: MAC and Regional  ? ?Post-op Pain Management: Regional block*  ? ?Induction: Intravenous ? ?PONV Risk Score and Plan: 1 and Ondansetron, Dexamethasone and Treatment may vary due to age or medical condition ? ?Airway Management Planned: Simple Face Mask, Natural Airway and Nasal Cannula ? ?Additional Equipment: None ? ?Intra-op Plan:  ? ?Post-operative Plan:  ? ?Informed Consent:  ? ?Plan Discussed with:  ? ?Anesthesia Plan Comments: (Lab Results ?     Component                Value               Date                 ?     WBC                      7.4                 02/07/2022           ?     HGB                      13.6                02/07/2022           ?     HCT                      39.8                 02/07/2022           ?     MCV                      88.4                02/07/2022           ?     PLT                      341  02/07/2022           ?Lab Results ?     Component                Value               Date                 ?     NA                       132 (L)             02/07/2022           ?     K                        3.7                 02/07/2022           ?     CO2                      19 (L)              02/07/2022           ?     GLUCOSE                  121 (H)             02/07/2022           ?     BUN                      21                  02/07/2022           ?     CREATININE               1.78 (H)            02/07/2022           ?     CALCIUM                  9.6                 02/07/2022           ?     EGFR                     38 (L)              06/07/2021           ?     GFRNONAA                 30 (L)              02/07/2022           ?ECHO 03/23: ??1. Left ventricular ejection fraction, by estimation, is 65 to 70%. The  ?left ventricle has normal function. The left ventricle has no regional  ?wall motion abnormalities. Left ventricular diastolic parameters are  ?indeterminate. LVOT peak gradient 15 mmHg  ??2. Right ventricular systolic function is mildly reduced. The right  ?ventricular size is normal. There is normal pulmonary artery systolic  ?pressure. The estimated right ventricular systolic pressure  is 30.5 mmHg.  ??3. Left atrial size was moderately dilated.  ??4. A small pericardial effusion is present.  ??5. There is a prosthetic annuloplasty ring present in the mitral position  ??? ?Trivial mitral valve regurgitation. Moderate mitral stenosis. MG 25mHg  ?at 74 bpm, MVA 1.5 cm^2 by continuity equation  ??6. The aortic valve was not well visualized. Aortic valve regurgitation  ?is not visualized. No aortic stenosis is present.  ??7. The inferior vena cava is normal in size with greater than 50%  ?respiratory variability, suggesting right atrial pressure of 3  mmHg. )  ? ? ? ? ? ?Anesthesia Quick Evaluation ? ?

## 2022-02-17 NOTE — Anesthesia Procedure Notes (Signed)
Anesthesia Regional Block: Axillary brachial plexus block  ? ?Pre-Anesthetic Checklist: , timeout performed,  Correct Patient, Correct Site, Correct Laterality,  Correct Procedure, Correct Position, site marked,  Risks and benefits discussed,  Surgical consent,  Pre-op evaluation,  At surgeon's request and post-op pain management ? ?Laterality: Right and Upper ? ?Prep: chloraprep     ?  ?Needles:  ?Injection technique: Single-shot ? ?  ? ? ?Needle Length: 9cm  ?Needle Gauge: 22  ? ? ? ?Additional Needles: ?Arrow? StimuQuik? ECHO Echogenic Stimulating PNB Needle ? ?Procedures:,,,, ultrasound used (permanent image in chart),,    ?Narrative:  ?Start time: 02/17/2022 1:14 PM ?End time: 02/17/2022 1:22 PM ?Injection made incrementally with aspirations every 5 mL. ? ?Performed by: Personally  ?Anesthesiologist: Oleta Mouse, MD ? ? ? ? ?

## 2022-02-17 NOTE — Progress Notes (Signed)
Orthopedic Tech Progress Note ?Patient Details:  ?Katie [name] ?Sep 24, 1946 ?[id number] ? ?PACU RN called requesting an ARM SLING for patient  ? ?Ortho Devices ?Type of Ortho Device: Arm sling ?Ortho Device/Splint Location: RUE ?Ortho Device/Splint Interventions: Ordered, Application, Adjustment ?  ?Post Interventions ?Patient Tolerated: Well ?Instructions Provided: Care of device ? ?Janit Pagan ?02/17/2022, 4:32 PM ? ?

## 2022-02-17 NOTE — Discharge Instructions (Signed)
KEEP BANDAGE CLEAN AND DRY CALL OFFICE FOR F/U APPT [phone] in 15 days KEEP HAND ELEVATED ABOVE HEART OK TO APPLY ICE TO OPERATIVE AREA CONTACT OFFICE IF ANY WORSENING PAIN OR CONCERNS.  

## 2022-02-17 NOTE — Transfer of Care (Signed)
Immediate Anesthesia Transfer of Care Note ? ?Patient: Katie [name] ? ?Procedure(s) Performed: OPEN REDUCTION INTERNAL FIXATION (ORIF) WRIST FRACTURE (Right: Wrist) ? ?Patient Location: PACU ? ?Anesthesia Type:MAC and Regional ? ?Level of Consciousness: awake and alert  ? ?Airway & Oxygen Therapy: Patient Spontanous Breathing ? ?Post-op Assessment: Report given to RN and Post -op Vital signs reviewed and stable ? ?Post vital signs: Reviewed and stable ? ?Last Vitals:  ?Vitals Value Taken Time  ?BP 137/77 02/17/22 1458  ?Temp    ?Pulse 72 02/17/22 1500  ?Resp 17 02/17/22 1500  ?SpO2 100 % 02/17/22 1500  ?Vitals shown include unvalidated device data. ? ?Last Pain:  ?Vitals:  ? 02/17/22 1314  ?TempSrc:   ?PainSc: 0-No pain  ?   ? ?  ? ?Complications: No notable events documented. ?

## 2022-02-17 NOTE — Op Note (Signed)
PREOPERATIVE DIAGNOSIS: Right wrist intra-articular distal radius fracture 3 more fragments ? ?POSTOPERATIVE DIAGNOSIS: Same ? ?ATTENDING SURGEON: Dr. Iran Planas who scrubbed and present for the entire procedure ? ?ASSISTANT SURGEON: Gertie Fey, PA-C was scrubbed and necessary for open reduction internal fixation closure and splinting in a timely fashion ? ?ANESTHESIA: Regional with IV sedation ? ?OPERATIVE PROCEDURE: ?Right wrist open reduction internal fixation of displaced wrist distal radius fracture intra-articular fracture 3 more fragments ?Right wrist brachioradialis tendon release tendon tenotomy ?Radiographs 3 views right wrist ? ? ?IMPLANTS: Biomet DVR cross lock standard, 2 cc biomet sta-graft ? ?EBL: Minimal ? ?RADIOGRAPHIC INTERPRETATION: AP lateral oblique views of the wrist do show the volar plate fixation in place with good restoration of the radial height inclination and tilt ? ?SURGICAL INDICATIONS: Patient is a right-hand-dominant female who sustained a highly comminuted distal radius fracture.  Patient was seen evaluate in the office and recommend undergo the above procedure.  Risks of surgery include but not limited to bleeding infection damage nearby nerves arteries or tendons hardware failure loss of motion of the wrist and digits nonunion malunion need for further surgical invention.  Signed informed consent obtained on the day of surgery ? ?SURGICAL TECHNIQUE: The patient was palpated find in the preoperative holding area marked apart a marker made on the right wrist indicate correct operative site.  The patient then brought back operating placed supine on the anesthesia table where the regional anesthetic had been administered.  Patient tolerates well.  A well-padded tourniquet placed on the right brachium and stay with the appropriate drape.  The right upper extremities then prepped and draped normal sterile fashion.  Preoperative antibiotics were given prior to skin incision.  A  timeout was called the correct site identified procedure then begun.  Attention then turned to the right wrist longitudinal incision made directly over the FCR sheath.  Dissection carried down through the skin and subcutaneous tissue.  Deep dissection carried down to the through the level of the pronator quadratus carefully protecting the FPL tendon.  An L-shaped pronator quadratus flap was then elevated and the fracture site was then exposed.  In order to reduce the radial column separate intervention was done to release the brachial radialis.  Tendon tenotomy was then carried out to allow for exposure of the radial column and the intra-articular fracture.  This was an intra-articular fracture 3 more fragments.  2 cc sta-graft placed into metaphyseal defect. Following this open reduction was then performed and the volar plate was then applied.  Was held distally with a K wire.  Plate position was then confirmed using the mini C arm.  Following this screw fixation was then carried out distally from an ulnar to radial direction with a combination distal locking pegs and screws.  Shaft fixation was carried out with a combination of locking nonlocking screws.  The wound was then thoroughly irrigated.  Final radiographs were then obtained.  The pronator quadratus was then closed with 2-0 Vicryl suture the subcutaneous tissues closed with Monocryl and skin closed with simple Prolene sutures.  Adaptic dressing a sterile compressive bandage then applied.  This patient was placed in a well-padded sugar-tong splint taken recovery in good condition. ? ?POSTOPERATIVE PLAN: Patient be discharged home.  See him back in the office in 2 weeks for wound check suture removal application of a cast short arm cast for 2 weeks and begin a therapy regimen at the 4-week mark.  Placed the therapy order begin at the 4-week  mark at the initial postoperative visit.  Radiographs at each visit. ?

## 2022-02-18 ENCOUNTER — Ambulatory Visit (INDEPENDENT_AMBULATORY_CARE_PROVIDER_SITE_OTHER): Payer: Medicare Other | Admitting: Vascular Surgery

## 2022-02-18 ENCOUNTER — Encounter: Payer: Self-pay | Admitting: Vascular Surgery

## 2022-02-18 VITALS — BP 135/97 | HR 59 | Temp 97.0°F | Resp 16 | Ht 61.0 in | Wt 135.0 lb

## 2022-02-18 DIAGNOSIS — I7143 Infrarenal abdominal aortic aneurysm, without rupture: Secondary | ICD-10-CM

## 2022-02-18 NOTE — Anesthesia Postprocedure Evaluation (Signed)
Anesthesia Post Note ? ?Patient: Katie [name] ? ?Procedure(s) Performed: OPEN REDUCTION INTERNAL FIXATION (ORIF) WRIST FRACTURE (Right: Wrist) ? ?  ? ?Patient location during evaluation: PACU ?Anesthesia Type: Regional and MAC ?Level of consciousness: awake and alert ?Pain management: pain level controlled ?Vital Signs Assessment: post-procedure vital signs reviewed and stable ?Respiratory status: spontaneous breathing, nonlabored ventilation, respiratory function stable and patient connected to nasal cannula oxygen ?Cardiovascular status: stable and blood pressure returned to baseline ?Postop Assessment: no apparent nausea or vomiting ?Anesthetic complications: no ? ? ?No notable events documented. ? ?Last Vitals:  ?Vitals:  ? 02/17/22 1515 02/17/22 1526  ?BP: (!) 144/82 (!) 159/95  ?Pulse: 71 70  ?Resp: 16 16  ?Temp:  36.7 ?C  ?SpO2: 96% 100%  ?  ?Last Pain:  ?Vitals:  ? 02/17/22 1526  ?TempSrc:   ?PainSc: 0-No pain  ? ? ?  ?  ?  ?  ?  ?  ? ?Pecolia Marando ? ? ? ? ?

## 2022-02-18 NOTE — Progress Notes (Signed)
? ?Patient name: Katie [name] MRN: [id number] DOB: 06/25/1946 Sex: female ? ?REASON FOR VISIT: Postop check after EVAR ? ?HPI: ?Katie [name] is a 76 y.o. female presents for postop check after EVAR on 01/10/2022 for a 4.9 cm symptomatic abdominal aortic aneurysm.  Ultimately she presented back to the ED with ongoing abdominal pain and underwent CTA on 01/22/2022 showing no evidence of endoleak with stable stent graft position and no other complications from stent graft repair.  She does have a history of a remote right nephrectomy.  She reports she is having no additional abdominal pain at this time.  Was later diagnosed with shingles.  She has no lower extremity complaints.  Overall she is doing well.  She did fall and break her right arm recently. ? ?Past Medical History:  ?Diagnosis Date  ? AAA (abdominal aortic aneurysm) ([place])   ? Arthritis   ? Cholelithiasis 2011  ? s/p cholescystectomy   ? Chronic diastolic congestive heart failure ([place])   ? COPD (chronic obstructive pulmonary disease) ([place])   ? Emphysema   ? GERD (gastroesophageal reflux disease)   ? History of heartburn   ? History of renal cell carcinoma   ? Hypercholesteremia   ? Hypertension   ? Hypertension   ? Mitral regurgitation   ? Pneumonia 1980's  ? Renal cell carcinoma 01/2010  ? s/p right radical nephrectomy 01/2010,  [facility]  ? S/P minimally invasive mitral valve repair 04/20/2019  ? Complex valvuloplasty including triangular resection of flail segment of posterior leaflet, artificial Gore-tex neochord placement x6 and 30 mm Sorin Memo 4D ring annuloplasty via right mini thoracotomy approach  ? Shortness of breath   ? Tuberculosis   ? Tests positive for PPD. dad had h/o TB.  ? ? ?Past Surgical History:  ?Procedure Laterality Date  ? ABDOMINAL AORTIC ENDOVASCULAR STENT GRAFT N/A 01/10/2022  ? Procedure: ABDOMINAL AORTIC ENDOVASCULAR STENT GRAFT;  Surgeon: Marty Heck, MD;  Location: [facility];  Service: Vascular;   Laterality: N/A;  ? APPENDECTOMY  1970's  ? BUBBLE STUDY  03/18/2019  ? Procedure: BUBBLE STUDY;  Surgeon: Fay Records, MD;  Location: [facility];  Service: Cardiovascular;;  ? CARDIAC CATHETERIZATION  09/2011  ? CHOLECYSTECTOMY  01/2010  ? DIAGNOSTIC LAPAROSCOPY    ? KIDNEY SURGERY    ? LEFT AND RIGHT HEART CATHETERIZATION WITH CORONARY ANGIOGRAM N/A 09/30/2011  ? Procedure: LEFT AND RIGHT HEART CATHETERIZATION WITH CORONARY ANGIOGRAM;  Surgeon: Candee Furbish, MD;  Location[facility] CATH LAB;  Service: Cardiovascular;  Laterality: N/A;  ? MITRAL VALVE REPAIR Right 04/20/2019  ? Procedure: MINIMALLY INVASIVE MITRAL VALVE REPAIR (MVR) USING MEMO 4D SIZE 30;  Surgeon: Rexene Alberts, MD;  Location: [facility];  Service: Open Heart Surgery;  Laterality: Right;  ? MULTIPLE EXTRACTIONS WITH ALVEOLOPLASTY  10/06/2011  ? Procedure: MULTIPLE EXTRACION WITH ALVEOLOPLASTY;  Surgeon: Lenn Cal, DDS;  Location: [facility];  Service: Oral Surgery;  Laterality: N/A;  Multiple extraction of tooth #'s 1, 6, 8, 10, 11, 22, 23, 26, 27, 28, 29 with alveoloplasty and Upper right buccal exostoses reductions.  ? NEPHRECTOMY RADICAL  01/2010  ? right   ? OVARIAN CYST REMOVAL  1970's  ? "went through belly button"  ? RIGHT/LEFT HEART CATH AND CORONARY ANGIOGRAPHY N/A 03/22/2019  ? Procedure: RIGHT/LEFT HEART CATH AND CORONARY ANGIOGRAPHY;  Surgeon: [name], MD;  Location: [facility] CV LAB;  Service: Cardiovascular;  Laterality: N/A;  ? TEE WITHOUT CARDIOVERSION  10/01/2011  ?  Procedure: TRANSESOPHAGEAL ECHOCARDIOGRAM (TEE);  Surgeon: [name];  Location: [facility];  Service: Cardiovascular;  Laterality: N/A;  ? TEE WITHOUT CARDIOVERSION N/A 03/18/2019  ? Procedure: TRANSESOPHAGEAL ECHOCARDIOGRAM (TEE);  Surgeon: Fay Records, MD;  Location: [facility];  Service: Cardiovascular;  Laterality: N/A;  ? TEE WITHOUT CARDIOVERSION N/A 04/20/2019  ? Procedure: TRANSESOPHAGEAL ECHOCARDIOGRAM (TEE);  Surgeon: Rexene Alberts,  MD;  Location: [facility];  Service: Open Heart Surgery;  Laterality: N/A;  ? TUBAL LIGATION  1970's  ? US ECHOCARDIOGRAPHY  09/2011  ? ? ?Family History  ?Problem Relation Age of Onset  ? COPD Mother   ?     DECEASED/SMOKED  ? Diabetes Brother   ? Diabetes Brother   ? ? ?SOCIAL HISTORY: ?Social History  ? ?Tobacco Use  ? Smoking status: Every Day  ?  Packs/day: 0.50  ?  Years: 53.00  ?  Pack years: 26.50  ?  Types: Cigarettes  ?  Last attempt to quit: 03/18/2019  ?  Years since quitting: 2.9  ?  Passive exposure: Current (Son who lives with her Smokes)  ? Smokeless tobacco: Never  ?Substance Use Topics  ? Alcohol use: Not Currently  ? ? ?No Known Allergies ? ?Current Outpatient Medications  ?Medication Sig Dispense Refill  ? acetaminophen (TYLENOL) 500 MG tablet Take 1,000 mg by mouth every 8 (eight) hours as needed for mild pain.    ? albuterol (VENTOLIN HFA) 108 (90 Base) MCG/ACT inhaler Inhale 2 puffs into the lungs every 6 (six) hours as needed for wheezing or shortness of breath. 8 g 5  ? aspirin EC 81 MG tablet Take 1 tablet (81 mg total) by mouth daily. 30 tablet 0  ? atorvastatin (LIPITOR) 40 MG tablet Take 40 mg by mouth at bedtime.    ? famotidine (PEPCID) 20 MG tablet Take 1 tablet (20 mg total) by mouth daily. 30 tablet 0  ? loperamide (IMODIUM) 2 MG capsule Take 1 capsule (2 mg total) by mouth every 12 (twelve) hours as needed for diarrhea or loose stools. 30 capsule 0  ? losartan (COZAAR) 50 MG tablet Take 50 mg by mouth daily.    ? metoCLOPramide (REGLAN) 10 MG tablet Take 1 tablet (10 mg total) by mouth every 8 (eight) hours as needed for up to 20 doses for nausea. 20 tablet 0  ? metoprolol tartrate (LOPRESSOR) 25 MG tablet Take 1 tablet (25 mg total) by mouth 2 (two) times daily. 60 tablet 2  ? Misc. Devices (PULSE OXIMETER FOR FINGER) MISC 1 Device by Does not apply route as needed. 1 each 0  ? ondansetron (ZOFRAN) 4 MG tablet Take 4 mg by mouth daily.    ? OVER THE COUNTER MEDICATION Take 1 tablet by  mouth at bedtime. Blue tablet. [employer] or [facility] sleeping aid    ? oxyCODONE-acetaminophen (PERCOCET/ROXICET) 5-325 MG tablet Take 1 tablet by mouth every 6 (six) hours as needed for severe pain. 12 tablet 0  ? polyethylene glycol (MIRALAX) 17 g packet Take 17 g by mouth daily. 14 each 0  ? PROLIA 60 MG/ML SOSY injection Inject 60 mg into the skin every 6 (six) months.    ? simethicone (GAS-X) 80 MG chewable tablet Chew 1 tablet (80 mg total) by mouth 4 (four) times daily as needed for up to 5 days for flatulence. 20 tablet 0  ? TRELEGY ELLIPTA 200-62.5-25 MCG/INH AEPB Inhale 1 puff into the lungs daily.    ? valACYclovir (VALTREX) 1000 MG tablet  Take 1,000 mg by mouth 2 (two) times daily.    ? ?No current facility-administered medications for this visit.  ? ? ?REVIEW OF SYSTEMS:  ?'[X]'$  denotes positive finding, '[ ]'$  denotes negative finding ?Cardiac  Comments:  ?Chest pain or chest pressure:    ?Shortness of breath upon exertion:    ?Short of breath when lying flat:    ?Irregular heart rhythm:    ?    ?Vascular    ?Pain in calf, thigh, or hip brought on by ambulation:    ?Pain in feet at night that wakes you up from your sleep:     ?Blood clot in your veins:    ?Leg swelling:     ?    ?Pulmonary    ?Oxygen at home:    ?Productive cough:     ?Wheezing:     ?    ?Neurologic    ?Sudden weakness in arms or legs:     ?Sudden numbness in arms or legs:     ?Sudden onset of difficulty speaking or slurred speech:    ?Temporary loss of vision in one eye:     ?Problems with dizziness:     ?    ?Gastrointestinal    ?Blood in stool:     ?Vomited blood:     ?    ?Genitourinary    ?Burning when urinating:     ?Blood in urine:    ?    ?Psychiatric    ?Major depression:     ?    ?Hematologic    ?Bleeding problems:    ?Problems with blood clotting too easily:    ?    ?Skin    ?Rashes or ulcers:    ?    ?Constitutional    ?Fever or chills:    ? ? ?PHYSICAL EXAM: ?Vitals:  ? 02/18/22 1021  ?BP: (!) 135/97  ?Pulse: (!) 59  ?Resp: 16   ?Temp: (!) 97 ?F (36.1 ?C)  ?TempSrc: Temporal  ?SpO2: 98%  ?Weight: 135 lb (61.2 kg)  ?Height: '5\' 1"'$  (1.549 m)  ? ? ?GENERAL: The patient is a well-nourished female, in no acute distress. The vital signs are

## 2022-02-24 ENCOUNTER — Other Ambulatory Visit: Payer: Self-pay | Admitting: *Deleted

## 2022-02-24 DIAGNOSIS — Z9889 Other specified postprocedural states: Secondary | ICD-10-CM

## 2022-02-27 ENCOUNTER — Ambulatory Visit (HOSPITAL_COMMUNITY)
Admission: EM | Admit: 2022-02-27 | Discharge: 2022-02-27 | Disposition: A | Discharge status: Home / Self Care | Payer: Medicare Other | Attending: Family Medicine | Admitting: Family Medicine

## 2022-02-27 ENCOUNTER — Encounter (HOSPITAL_COMMUNITY): Payer: Self-pay | Admitting: Emergency Medicine

## 2022-02-27 ENCOUNTER — Ambulatory Visit (INDEPENDENT_AMBULATORY_CARE_PROVIDER_SITE_OTHER): Payer: Medicare Other

## 2022-02-27 ENCOUNTER — Other Ambulatory Visit: Payer: Self-pay

## 2022-02-27 ENCOUNTER — Emergency Department (HOSPITAL_COMMUNITY)
Admission: EM | Admit: 2022-02-27 | Discharge: 2022-02-28 | Disposition: A | Discharge status: Home / Self Care | Payer: Medicare Other | Attending: Emergency Medicine | Admitting: Emergency Medicine

## 2022-02-27 ENCOUNTER — Encounter: Payer: Self-pay | Admitting: Nurse Practitioner

## 2022-02-27 DIAGNOSIS — R1110 Vomiting, unspecified: Secondary | ICD-10-CM

## 2022-02-27 DIAGNOSIS — Z9049 Acquired absence of other specified parts of digestive tract: Secondary | ICD-10-CM | POA: Insufficient documentation

## 2022-02-27 DIAGNOSIS — I700 Atherosclerosis of aorta: Secondary | ICD-10-CM | POA: Diagnosis not present

## 2022-02-27 DIAGNOSIS — R1030 Lower abdominal pain, unspecified: Secondary | ICD-10-CM

## 2022-02-27 DIAGNOSIS — R1032 Left lower quadrant pain: Secondary | ICD-10-CM | POA: Diagnosis present

## 2022-02-27 DIAGNOSIS — Z7982 Long term (current) use of aspirin: Secondary | ICD-10-CM | POA: Diagnosis not present

## 2022-02-27 DIAGNOSIS — Z85528 Personal history of other malignant neoplasm of kidney: Secondary | ICD-10-CM | POA: Insufficient documentation

## 2022-02-27 DIAGNOSIS — K5730 Diverticulosis of large intestine without perforation or abscess without bleeding: Secondary | ICD-10-CM | POA: Insufficient documentation

## 2022-02-27 DIAGNOSIS — K5903 Drug induced constipation: Secondary | ICD-10-CM

## 2022-02-27 DIAGNOSIS — K7689 Other specified diseases of liver: Secondary | ICD-10-CM | POA: Insufficient documentation

## 2022-02-27 DIAGNOSIS — R7989 Other specified abnormal findings of blood chemistry: Secondary | ICD-10-CM | POA: Diagnosis not present

## 2022-02-27 LAB — CBC WITH DIFFERENTIAL/PLATELET
Abs Immature Granulocytes: 0.05 10*3/uL (ref 0.00–0.07)
Basophils Absolute: 0 10*3/uL (ref 0.0–0.1)
Basophils Relative: 0 %
Eosinophils Absolute: 0 10*3/uL (ref 0.0–0.5)
Eosinophils Relative: 0 %
HCT: 38.2 % (ref 36.0–46.0)
Hemoglobin: 13.3 g/dL (ref 12.0–15.0)
Immature Granulocytes: 1 %
Lymphocytes Relative: 21 %
Lymphs Abs: 1.9 10*3/uL (ref 0.7–4.0)
MCH: 31.1 pg (ref 26.0–34.0)
MCHC: 34.8 g/dL (ref 30.0–36.0)
MCV: 89.3 fL (ref 80.0–100.0)
Monocytes Absolute: 0.5 10*3/uL (ref 0.1–1.0)
Monocytes Relative: 6 %
Neutro Abs: 6.4 10*3/uL (ref 1.7–7.7)
Neutrophils Relative %: 72 %
Platelets: 419 10*3/uL — ABNORMAL HIGH (ref 150–400)
RBC: 4.28 MIL/uL (ref 3.87–5.11)
RDW: 13.3 % (ref 11.5–15.5)
WBC: 8.9 10*3/uL (ref 4.0–10.5)
nRBC: 0 % (ref 0.0–0.2)

## 2022-02-27 LAB — COMPREHENSIVE METABOLIC PANEL
ALT: 14 U/L (ref 0–44)
AST: 23 U/L (ref 15–41)
Albumin: 3.9 g/dL (ref 3.5–5.0)
Alkaline Phosphatase: 63 U/L (ref 38–126)
Anion gap: 13 (ref 5–15)
BUN: 16 mg/dL (ref 8–23)
CO2: 24 mmol/L (ref 22–32)
Calcium: 10.1 mg/dL (ref 8.9–10.3)
Chloride: 96 mmol/L — ABNORMAL LOW (ref 98–111)
Creatinine, Ser: 1.57 mg/dL — ABNORMAL HIGH (ref 0.44–1.00)
GFR, Estimated: 34 mL/min — ABNORMAL LOW (ref >60)
Glucose, Bld: 102 mg/dL — ABNORMAL HIGH (ref 70–99)
Potassium: 3.5 mmol/L (ref 3.5–5.1)
Sodium: 133 mmol/L — ABNORMAL LOW (ref 135–145)
Total Bilirubin: 1.1 mg/dL (ref 0.3–1.2)
Total Protein: 7.4 g/dL (ref 6.5–8.1)

## 2022-02-27 LAB — POCT URINALYSIS DIPSTICK, ED / UC
Glucose, UA: NEGATIVE mg/dL
Ketones, ur: 40 mg/dL — AB
Leukocytes,Ua: NEGATIVE
Nitrite: NEGATIVE
Protein, ur: 30 mg/dL — AB
Specific Gravity, Urine: 1.02 (ref 1.005–1.030)
Urobilinogen, UA: 0.2 mg/dL (ref 0.0–1.0)
pH: 6 (ref 5.0–8.0)

## 2022-02-27 LAB — LIPASE, BLOOD: Lipase: 29 U/L (ref 11–51)

## 2022-02-27 NOTE — ED Notes (Addendum)
Patient is being discharged from the Urgent Care and sent to the Emergency Department via private vehicle with daughter . Per B. Hagler, MD, patient is in need of higher level of care due to small bowel obstruction. Patient is aware and verbalizes understanding of plan of care.  ?Vitals:  ? 02/27/22 1846 02/27/22 1847  ?BP: (!) 124/94   ?Pulse: 74   ?Resp: 18   ?Temp:  98.2 ?F (36.8 ?C)  ?SpO2: 100%   ?  ?

## 2022-02-27 NOTE — ED Triage Notes (Signed)
Pt is present today with c/o vomit and lower abdominal pain. Pt sx started x3 days ago  ?

## 2022-02-27 NOTE — ED Triage Notes (Signed)
Abd pain x3 days but has been having episodes since march of this year. Has an appt with GI at end of the month. Endorses N/V (looks like food or clear), fever.  ? ?Presented to UC today and was told she had an obstruction in GI tract and to present to ER. ? ?Last BM 1 week ago. Taking opiates for recent surgery ?Passing gas. ?

## 2022-02-27 NOTE — ED Provider Notes (Signed)
?[facility] ? ? ?[id number] ?02/27/22 Arrival Time: 1505 ? ?ASSESSMENT & PLAN: ? ?1. Lower abdominal pain   ?2. Drug-induced constipation   ? ?Considerable lower abdominal pain here. ?I have personally viewed the imaging studies ordered this visit. ?Agree with radiology interpretation. Possible early SBO. ? ?To ED for further evaluation. Declines EMS transport. By private vehicle. Stable upon d/c. ? ? Follow-up Information   ? ? Go to  [facility].   ?Specialty: Emergency Medicine ?Contact information: ?[address] ?[phone] mc ?[place] ?[phone] ? ?  ?  ? ?  ?  ? ?  ? ? ?Reviewed expectations re: course of current medical issues. Questions answered. ?Outlined signs and symptoms indicating need for more acute intervention. ?Patient verbalized understanding. ?After Visit Summary given. ? ? ?SUBJECTIVE: ?History from: patient. ?Katie [name] is a 76 y.o. female who presents with complaint of fairly persistent lower abdominal pain; gradual onset; over past 2-3 days; much worse today. With assoc n/v today. Afebrile. ?Freq narcotic use. Decreased appetite today; virtually no PO intake today. No urinary symptoms. No bowel movement in 6-7 days. ? ?No LMP recorded. Patient is postmenopausal. ? ?Past Surgical History:  ?Procedure Laterality Date  ? ABDOMINAL AORTIC ENDOVASCULAR STENT GRAFT N/A 01/10/2022  ? Procedure: ABDOMINAL AORTIC ENDOVASCULAR STENT GRAFT;  Surgeon: Marty Heck, MD;  Location: [facility];  Service: Vascular;  Laterality: N/A;  ? APPENDECTOMY  1970's  ? BUBBLE STUDY  03/18/2019  ? Procedure: BUBBLE STUDY;  Surgeon: Fay Records, MD;  Location: [facility];  Service: Cardiovascular;;  ? CARDIAC CATHETERIZATION  09/2011  ? CHOLECYSTECTOMY  01/2010  ? DIAGNOSTIC LAPAROSCOPY    ? KIDNEY SURGERY    ? LEFT AND RIGHT HEART CATHETERIZATION WITH CORONARY ANGIOGRAM N/A 09/30/2011  ? Procedure: LEFT AND RIGHT HEART  CATHETERIZATION WITH CORONARY ANGIOGRAM;  Surgeon: Candee Furbish, MD;  Location: [facility] CATH LAB;  Service: Cardiovascular;  Laterality: N/A;  ? MITRAL VALVE REPAIR Right 04/20/2019  ? Procedure: MINIMALLY INVASIVE MITRAL VALVE REPAIR (MVR) USING MEMO 4D SIZE 30;  Surgeon: Rexene Alberts, MD;  Location: [place] [facility];  Service: Open Heart Surgery;  Laterality: Right;  ? MULTIPLE EXTRACTIONS WITH ALVEOLOPLASTY  10/06/2011  ? Procedure: MULTIPLE EXTRACION WITH ALVEOLOPLASTY;  Surgeon: Lenn Cal, DDS;  Location: [facility];  Service: Oral Surgery;  Laterality: N/A;  Multiple extraction of tooth #'s 1, 6, 8, 10, 11, 22, 23, 26, 27, 28, 29 with alveoloplasty and Upper right buccal exostoses reductions.  ? NEPHRECTOMY RADICAL  01/2010  ? right   ? ORIF WRIST FRACTURE Right 02/17/2022  ? Procedure: OPEN REDUCTION INTERNAL FIXATION (ORIF) WRIST FRACTURE;  Surgeon: Iran Planas, MD;  Location: [facility];  Service: Orthopedics;  Laterality: Right;  with IV sedation  ? OVARIAN CYST REMOVAL  1970's  ? "went through belly button"  ? RIGHT/LEFT HEART CATH AND CORONARY ANGIOGRAPHY N/A 03/22/2019  ? Procedure: RIGHT/LEFT HEART CATH AND CORONARY ANGIOGRAPHY;  Surgeon: [name], MD;  Location: [facility] CV LAB;  Service: Cardiovascular;  Laterality: N/A;  ? TEE WITHOUT CARDIOVERSION  10/01/2011  ? Procedure: TRANSESOPHAGEAL ECHOCARDIOGRAM (TEE);  Surgeon: [name];  Location: [facility];  Service: Cardiovascular;  Laterality: N/A;  ? TEE WITHOUT CARDIOVERSION N/A 03/18/2019  ? Procedure: TRANSESOPHAGEAL ECHOCARDIOGRAM (TEE);  Surgeon: Fay Records, MD;  Location: [facility];  Service: Cardiovascular;  Laterality: N/A;  ? TEE WITHOUT CARDIOVERSION N/A 04/20/2019  ? Procedure: TRANSESOPHAGEAL ECHOCARDIOGRAM (TEE);  Surgeon: Rexene Alberts, MD;  Location: [facility];  Service: Open Heart Surgery;  Laterality: N/A;  ? TUBAL LIGATION  1970's  ? US ECHOCARDIOGRAPHY  09/2011  ? ? ? ?OBJECTIVE: ? ?Vitals:  ? 02/27/22 1846 02/27/22 1847   ?BP: (!) 124/94   ?Pulse: 74   ?Resp: 18   ?Temp:  98.2 ?F (36.8 ?C)  ?SpO2: 100%   ?  ?General appearance: alert; oriented; appears to be in significant pain ?HEENT: [place]; AT; oropharynx moist ?Lungs: unlabored respirations ?Abdomen: soft; with mild distention; poorly localized tenderness to palpation over lower abdomen ; faint bowel sounds appreciated; without masses or organomegaly; without  frank guarding or rebound tenderness but does pull away with exam ?Back: without reported CVA tenderness; FROM at waist ?Extremities: without LE edema; symmetrical; without gross deformities ?Skin: warm and dry ?Psychological: alert and cooperative; normal mood and affect ? ?Labs: ?Results for orders placed or performed during the hospital encounter of 02/27/22  ?POC Urinalysis dipstick  ?Result Value Ref Range  ? Glucose, UA NEGATIVE NEGATIVE mg/dL  ? Bilirubin Urine SMALL (A) NEGATIVE  ? Ketones, ur 40 (A) NEGATIVE mg/dL  ? Specific Gravity, Urine 1.020 1.005 - 1.030  ? Hgb urine dipstick TRACE (A) NEGATIVE  ? pH 6.0 5.0 - 8.0  ? Protein, ur 30 (A) NEGATIVE mg/dL  ? Urobilinogen, UA 0.2 0.0 - 1.0 mg/dL  ? Nitrite NEGATIVE NEGATIVE  ? Leukocytes,Ua NEGATIVE NEGATIVE  ? ?Labs Reviewed  ?POCT URINALYSIS DIPSTICK, ED / UC - Abnormal; Notable for the following components:  ?    Result Value  ? Bilirubin Urine SMALL (*)   ? Ketones, ur 40 (*)   ? Hgb urine dipstick TRACE (*)   ? Protein, ur 30 (*)   ? All other components within normal limits  ? ? ?Imaging: ?DG Abd 2 Views ? ?Result Date: 02/27/2022 ?CLINICAL DATA:  Lower abdominal pain, vomiting EXAM: ABDOMEN - 2 VIEW COMPARISON:  01/24/2022 FINDINGS: Mildly distended small bowel in the mid abdomen. Gas and stool in the colon which is decompressed. No free air. Surgical clips right upper quadrant. Aortic stent graft in the aorta and iliac arteries unchanged. IMPRESSION: Mild small bowel distension which could represent early small bowel obstruction. Follow-up recommended. No free  air. Electronically Signed   By: Franchot Gallo M.D.   On: 02/27/2022 19:52    ? ?No Known Allergies ?                                            ?Past Medical History:  ?Diagnosis Date  ? AAA (abdominal aortic aneurysm) ([place])   ? Arthritis   ? Cholelithiasis 2011  ? s/p cholescystectomy   ? Chronic diastolic congestive heart failure ([place])   ? COPD (chronic obstructive pulmonary disease) ([place])   ? Emphysema   ? GERD (gastroesophageal reflux disease)   ? History of heartburn   ? History of renal cell carcinoma   ? Hypercholesteremia   ? Hypertension   ? Hypertension   ? Mitral regurgitation   ? Pneumonia 1980's  ? Renal cell carcinoma 01/2010  ? s/p right radical nephrectomy 01/2010,  [facility]  ? S/P minimally invasive mitral valve repair 04/20/2019  ? Complex valvuloplasty including triangular resection of flail segment of posterior leaflet, artificial Gore-tex neochord placement x6 and 30 mm Sorin Memo 4D ring annuloplasty via  right mini thoracotomy approach  ? Shortness of breath   ? Tuberculosis   ? Tests positive for PPD. dad had h/o TB.  ? ? ?Social History  ? ?Socioeconomic History  ? Marital status: Married  ?  Spouse name: Not on file  ? Number of children: 3  ? Years of education: 63  ? Highest education level: Not on file  ?Occupational History  ? Occupation: Freight forwarder  ?  Employer: IT trainer  ?  Comment: manages cleaning business  ?Tobacco Use  ? Smoking status: Every Day  ?  Packs/day: 0.50  ?  Years: 53.00  ?  Pack years: 26.50  ?  Types: Cigarettes  ?  Last attempt to quit: 03/18/2019  ?  Years since quitting: 2.9  ?  Passive exposure: Current (Son who lives with her Smokes)  ? Smokeless tobacco: Never  ?Vaping Use  ? Vaping Use: Never used  ?Substance and Sexual Activity  ? Alcohol use: Not Currently  ? Drug use: Not Currently  ?  Types: Cocaine  ?  Comment: pos drug test in epic on 02/07/22  ? Sexual activity: Not Currently  ?Other Topics Concern  ? Not on file  ?Social  History Narrative  ?   ?    ? Lives in [place] with her husband.   ? 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   ? Patient has 3 grown children that do not live with her

## 2022-02-27 NOTE — ED Provider Triage Note (Addendum)
Emergency Medicine Provider Triage Evaluation Note ? ?Katie [name] , a 76 y.o. female  was evaluated in triage.  Pt complains of abdominal pain.  She states she has had this ongoing since March and has had multiple ER visits.  Today she was evaluated at urgent care and was told she needs to present to the emergency room for further evaluation as there was concern for bowel obstruction.  She denies fever.  States pain is worse today than when she was previously evaluated. ? ?Review of Systems  ?Positive: As above ?Negative: As above ? ?Physical Exam  ?BP (!) 170/95 (BP Location: Left Arm)   Pulse 75   Temp 97.9 ?F (36.6 ?C) (Oral)   Resp 18   Wt 62.1 kg   SpO2 95%   BMI 25.89 kg/m?  ?Gen:   Awake, no distress   ?Resp:  Normal effort  ?MSK:   Moves extremities without difficulty  ?Other:  Abdomen mildly distended with significant generalized tenderness present.  Without flank tenderness ? ?Medical Decision Making  ?Medically screening exam initiated at 8:30 PM.  Appropriate orders placed.  Katie [name] was informed that the remainder of the evaluation will be completed by another provider, this initial triage assessment does not replace that evaluation, and the importance of remaining in the ED until their evaluation is complete. ? ? ?  ?Evlyn Courier, PA-C ?02/27/22 2032 ? ?  ?Evlyn Courier, PA-C ?02/27/22 2039 ? ?

## 2022-02-28 ENCOUNTER — Emergency Department (HOSPITAL_COMMUNITY): Payer: Medicare Other

## 2022-02-28 ENCOUNTER — Encounter (HOSPITAL_COMMUNITY): Payer: Self-pay | Admitting: Emergency Medicine

## 2022-02-28 LAB — URINALYSIS, ROUTINE W REFLEX MICROSCOPIC
Bilirubin Urine: NEGATIVE
Glucose, UA: NEGATIVE mg/dL
Hgb urine dipstick: NEGATIVE
Ketones, ur: 15 mg/dL — AB
Leukocytes,Ua: NEGATIVE
Nitrite: NEGATIVE
Protein, ur: NEGATIVE mg/dL
Specific Gravity, Urine: <1.005 — ABNORMAL LOW (ref 1.005–1.030)
pH: 6.5 (ref 5.0–8.0)

## 2022-02-28 MED ORDER — SODIUM CHLORIDE 0.9 % IV BOLUS
500.0000 mL | Freq: Once | INTRAVENOUS | Status: AC
  Administered 2022-02-28: 500 mL via INTRAVENOUS

## 2022-02-28 MED ORDER — ONDANSETRON HCL 4 MG/2ML IJ SOLN
4.0000 mg | Freq: Once | INTRAMUSCULAR | Status: AC
  Administered 2022-02-28: 4 mg via INTRAVENOUS
  Filled 2022-02-28: qty 2

## 2022-02-28 MED ORDER — ALUM & MAG HYDROXIDE-SIMETH 200-200-20 MG/5ML PO SUSP
30.0000 mL | Freq: Once | ORAL | Status: AC
  Administered 2022-02-28: 30 mL via ORAL
  Filled 2022-02-28: qty 30

## 2022-02-28 MED ORDER — KETOROLAC TROMETHAMINE 30 MG/ML IJ SOLN
30.0000 mg | Freq: Once | INTRAMUSCULAR | Status: AC
  Administered 2022-02-28: 30 mg via INTRAVENOUS
  Filled 2022-02-28: qty 1

## 2022-02-28 MED ORDER — HYDRALAZINE HCL 20 MG/ML IJ SOLN
2.0000 mg | Freq: Once | INTRAMUSCULAR | Status: AC
  Administered 2022-02-28: 2 mg via INTRAVENOUS
  Filled 2022-02-28: qty 1

## 2022-02-28 MED ORDER — IOHEXOL 300 MG/ML  SOLN
80.0000 mL | Freq: Once | INTRAMUSCULAR | Status: AC | PRN
  Administered 2022-02-28: 80 mL via INTRAVENOUS

## 2022-02-28 NOTE — ED Provider Notes (Signed)
?[facility] ?Provider Note ? ? ?CSN: [id number] ?Arrival date & time: 02/27/22  2015 ? ?  ? ?History ? ?Chief Complaint  ?Patient presents with  ? Abdominal Pain  ? ? ?Katie [name] is a 76 y.o. female. ? ?The history is provided by the patient.  ?Abdominal Pain ?Pain location:  LLQ and RLQ ?Pain quality: cramping   ?Pain radiates to:  Does not radiate ?Pain severity:  Moderate ?Onset quality:  Gradual ?Duration: weeks. ?Timing:  Constant ?Progression:  Waxing and waning ?Chronicity:  New ?Context: not alcohol use and not retching   ?Relieved by:  Nothing ?Worsened by:  Nothing ?Ineffective treatments:  None tried ?Associated symptoms: anorexia and nausea   ?Associated symptoms: no chest pain, no dysuria and no fever   ?Risk factors: not pregnant and no recent hospitalization   ? ?  ? ?Home Medications ?Prior to Admission medications   ?Medication Sig Start Date End Date Taking? Authorizing Provider  ?acetaminophen (TYLENOL) 500 MG tablet Take 1,000 mg by mouth every 8 (eight) hours as needed for mild pain.    [provider]  ?albuterol (VENTOLIN HFA) 108 (90 Base) MCG/ACT inhaler Inhale 2 puffs into the lungs every 6 (six) hours as needed for wheezing or shortness of breath. 02/08/20   Brunetta Jeans, PA-C  ?aspirin EC 81 MG tablet Take 1 tablet (81 mg total) by mouth daily. 09/29/18   Bonnielee Haff, MD  ?atorvastatin (LIPITOR) 40 MG tablet Take 40 mg by mouth at bedtime.    [provider]  ?famotidine (PEPCID) 20 MG tablet Take 1 tablet (20 mg total) by mouth daily. 01/22/22   Godfrey Pick, MD  ?loperamide (IMODIUM) 2 MG capsule Take 1 capsule (2 mg total) by mouth every 12 (twelve) hours as needed for diarrhea or loose stools. 01/17/22   Kayleen Memos, DO  ?losartan (COZAAR) 50 MG tablet Take 50 mg by mouth daily.    [provider]  ?metoCLOPramide (REGLAN) 10 MG tablet Take 1 tablet (10 mg total) by mouth every 8 (eight) hours as needed for  up to 20 doses for nausea. 01/22/22   Godfrey Pick, MD  ?metoprolol tartrate (LOPRESSOR) 25 MG tablet Take 1 tablet (25 mg total) by mouth 2 (two) times daily. 04/23/21   Kennyth Arnold, FNP  ?Misc. Devices (PULSE OXIMETER FOR FINGER) MISC 1 Device by Does not apply route as needed. 03/11/19   Brunetta Jeans, PA-C  ?ondansetron (ZOFRAN) 4 MG tablet Take 4 mg by mouth daily. 01/31/22   [provider]  ?OVER THE COUNTER MEDICATION Take 1 tablet by mouth at bedtime. Blue tablet. [employer] or [facility] sleeping aid    [provider]  ?oxyCODONE-acetaminophen (PERCOCET/ROXICET) 5-325 MG tablet Take 1 tablet by mouth every 6 (six) hours as needed for severe pain. 02/13/22   Sponseller, Eugene Garnet R, PA-C  ?polyethylene glycol (MIRALAX) 17 g packet Take 17 g by mouth daily. 01/17/22   Kayleen Memos, DO  ?PROLIA 60 MG/ML SOSY injection Inject 60 mg into the skin every 6 (six) months. 05/16/21   [provider]  ?simethicone (GAS-X) 80 MG chewable tablet Chew 1 tablet (80 mg total) by mouth 4 (four) times daily as needed for up to 5 days for flatulence. 01/17/22 03/29/22  Kayleen Memos, DO  ?TRELEGY ELLIPTA 200-62.5-25 MCG/INH AEPB Inhale 1 puff into the lungs daily. 07/17/21   [provider]  ?valACYclovir (VALTREX) 1000 MG tablet Take 1,000 mg by mouth 2 (  two) times daily. 01/30/22   [provider]  ?   ? ?Allergies    ?Patient has no known allergies.   ? ?Review of Systems   ?Review of Systems  ?Constitutional:  Negative for fever.  ?HENT:  Negative for facial swelling.   ?Eyes:  Negative for redness.  ?Respiratory:  Negative for wheezing and stridor.   ?Cardiovascular:  Negative for chest pain.  ?Gastrointestinal:  Positive for abdominal pain, anorexia and nausea.  ?Genitourinary:  Negative for dysuria.  ?Neurological:  Negative for facial asymmetry.  ?All other systems reviewed and are negative. ? ?Physical Exam ?Updated Vital Signs ?BP (!) 152/92 (BP Location: Right Arm)   Pulse 73    Temp 98.3 ?F (36.8 ?C) (Oral)   Resp 18   Ht '5\' 1"'$  (1.549 m)   Wt 62.1 kg   SpO2 97%   BMI 25.89 kg/m?  ?Physical Exam ?Vitals and nursing note reviewed.  ?Constitutional:   ?   General: She is not in acute distress. ?   Appearance: Normal appearance.  ?HENT:  ?   Head: Normocephalic and atraumatic.  ?   Nose: Nose normal.  ?Eyes:  ?   Conjunctiva/sclera: Conjunctivae normal.  ?   Pupils: Pupils are equal, round, and reactive to light.  ?Cardiovascular:  ?   Rate and Rhythm: Normal rate and regular rhythm.  ?   Pulses: Normal pulses.  ?   Heart sounds: Normal heart sounds.  ?Pulmonary:  ?   Effort: Pulmonary effort is normal.  ?   Breath sounds: Normal breath sounds.  ?Abdominal:  ?   General: Abdomen is flat.  ?   Palpations: Abdomen is soft. There is no mass.  ?   Tenderness: There is no abdominal tenderness. There is no guarding or rebound.  ?   Hernia: No hernia is present.  ?   Comments: Gassy throughout   ?Musculoskeletal:     ?   General: Normal range of motion.  ?   Cervical back: Normal range of motion and neck supple.  ?Skin: ?   General: Skin is warm and dry.  ?   Capillary Refill: Capillary refill takes less than 2 seconds.  ?Neurological:  ?   General: No focal deficit present.  ?   Mental Status: She is alert and oriented to person, place, and time.  ?   Deep Tendon Reflexes: Reflexes normal.  ?Psychiatric:     ?   Mood and Affect: Mood normal.     ?   Behavior: Behavior normal.  ? ? ?ED Results / Procedures / Treatments   ?Labs ?(all labs ordered are listed, but only abnormal results are displayed) ?Results for orders placed or performed during the hospital encounter of 02/27/22  ?CBC with Differential  ?Result Value Ref Range  ? WBC 8.9 4.0 - 10.5 K/uL  ? RBC 4.28 3.87 - 5.11 MIL/uL  ? Hemoglobin 13.3 12.0 - 15.0 g/dL  ? HCT 38.2 36.0 - 46.0 %  ? MCV 89.3 80.0 - 100.0 fL  ? MCH 31.1 26.0 - 34.0 pg  ? MCHC 34.8 30.0 - 36.0 g/dL  ? RDW 13.3 11.5 - 15.5 %  ? Platelets 419 (H) 150 - 400 K/uL  ?  nRBC 0.0 0.0 - 0.2 %  ? Neutrophils Relative % 72 %  ? Neutro Abs 6.4 1.7 - 7.7 K/uL  ? Lymphocytes Relative 21 %  ? Lymphs Abs 1.9 0.7 - 4.0 K/uL  ? Monocytes Relative 6 %  ?  Monocytes Absolute 0.5 0.1 - 1.0 K/uL  ? Eosinophils Relative 0 %  ? Eosinophils Absolute 0.0 0.0 - 0.5 K/uL  ? Basophils Relative 0 %  ? Basophils Absolute 0.0 0.0 - 0.1 K/uL  ? Immature Granulocytes 1 %  ? Abs Immature Granulocytes 0.05 0.00 - 0.07 K/uL  ?Comprehensive metabolic panel  ?Result Value Ref Range  ? Sodium 133 (L) 135 - 145 mmol/L  ? Potassium 3.5 3.5 - 5.1 mmol/L  ? Chloride 96 (L) 98 - 111 mmol/L  ? CO2 24 22 - 32 mmol/L  ? Glucose, Bld 102 (H) 70 - 99 mg/dL  ? BUN 16 8 - 23 mg/dL  ? Creatinine, Ser 1.57 (H) 0.44 - 1.00 mg/dL  ? Calcium 10.1 8.9 - 10.3 mg/dL  ? Total Protein 7.4 6.5 - 8.1 g/dL  ? Albumin 3.9 3.5 - 5.0 g/dL  ? AST 23 15 - 41 U/L  ? ALT 14 0 - 44 U/L  ? Alkaline Phosphatase 63 38 - 126 U/L  ? Total Bilirubin 1.1 0.3 - 1.2 mg/dL  ? GFR, Estimated 34 (L) >60 mL/min  ? Anion gap 13 5 - 15  ?Lipase, blood  ?Result Value Ref Range  ? Lipase 29 11 - 51 U/L  ?Urinalysis, Routine w reflex microscopic Urine, Clean Catch  ?Result Value Ref Range  ? Color, Urine YELLOW YELLOW  ? APPearance CLEAR CLEAR  ? Specific Gravity, Urine <1.005 (L) 1.005 - 1.030  ? pH 6.5 5.0 - 8.0  ? Glucose, UA NEGATIVE NEGATIVE mg/dL  ? Hgb urine dipstick NEGATIVE NEGATIVE  ? Bilirubin Urine NEGATIVE NEGATIVE  ? Ketones, ur 15 (A) NEGATIVE mg/dL  ? Protein, ur NEGATIVE NEGATIVE mg/dL  ? Nitrite NEGATIVE NEGATIVE  ? Leukocytes,Ua NEGATIVE NEGATIVE  ? ?DG Wrist 2 Views Right ? ?Result Date: 02/13/2022 ?CLINICAL DATA:  Post reduction EXAM: RIGHT WRIST - 2 VIEW COMPARISON:  02/12/2022 FINDINGS: Interim casting. Comminuted intra-articular distal radius fracture with significant impaction. Decreased radial displacement of the distal fracture fragment. Displaced ulnar styloid process fracture. Triquetral fracture fragment is not as well seen on these  images. IMPRESSION: Comminuted intra-articular distal radius fracture with significant impaction but overall decreased angulation compared to prior. Displaced ulnar styloid process fracture. Electronically Signed

## 2022-03-12 ENCOUNTER — Emergency Department (HOSPITAL_COMMUNITY)
Admission: EM | Admit: 2022-03-12 | Discharge: 2022-03-13 | Disposition: A | Discharge status: Home / Self Care | Payer: Medicare Other | Attending: Emergency Medicine | Admitting: Emergency Medicine

## 2022-03-12 DIAGNOSIS — Z79899 Other long term (current) drug therapy: Secondary | ICD-10-CM | POA: Insufficient documentation

## 2022-03-12 DIAGNOSIS — I110 Hypertensive heart disease with heart failure: Secondary | ICD-10-CM | POA: Diagnosis not present

## 2022-03-12 DIAGNOSIS — Z7982 Long term (current) use of aspirin: Secondary | ICD-10-CM | POA: Insufficient documentation

## 2022-03-12 DIAGNOSIS — I5032 Chronic diastolic (congestive) heart failure: Secondary | ICD-10-CM | POA: Insufficient documentation

## 2022-03-12 DIAGNOSIS — R112 Nausea with vomiting, unspecified: Secondary | ICD-10-CM | POA: Diagnosis present

## 2022-03-12 DIAGNOSIS — J439 Emphysema, unspecified: Secondary | ICD-10-CM | POA: Insufficient documentation

## 2022-03-12 DIAGNOSIS — Z85528 Personal history of other malignant neoplasm of kidney: Secondary | ICD-10-CM | POA: Diagnosis not present

## 2022-03-12 NOTE — ED Triage Notes (Signed)
Patient c/o n/v x 3 days.  Seen at [facility] and other hospitals recently for same.  Patient reports zofran isn't working and she's been unable to eat/drink. Patient denies abd pain.

## 2022-03-13 ENCOUNTER — Encounter (HOSPITAL_COMMUNITY): Payer: Self-pay | Admitting: Emergency Medicine

## 2022-03-13 ENCOUNTER — Emergency Department (HOSPITAL_COMMUNITY): Payer: Medicare Other

## 2022-03-13 LAB — COMPREHENSIVE METABOLIC PANEL
ALT: 24 U/L (ref 0–44)
AST: 25 U/L (ref 15–41)
Albumin: 4 g/dL (ref 3.5–5.0)
Alkaline Phosphatase: 71 U/L (ref 38–126)
Anion gap: 13 (ref 5–15)
BUN: 18 mg/dL (ref 8–23)
CO2: 24 mmol/L (ref 22–32)
Calcium: 10.4 mg/dL — ABNORMAL HIGH (ref 8.9–10.3)
Chloride: 96 mmol/L — ABNORMAL LOW (ref 98–111)
Creatinine, Ser: 1.55 mg/dL — ABNORMAL HIGH (ref 0.44–1.00)
GFR, Estimated: 35 mL/min — ABNORMAL LOW (ref >60)
Glucose, Bld: 117 mg/dL — ABNORMAL HIGH (ref 70–99)
Potassium: 3.5 mmol/L (ref 3.5–5.1)
Sodium: 133 mmol/L — ABNORMAL LOW (ref 135–145)
Total Bilirubin: 1.3 mg/dL — ABNORMAL HIGH (ref 0.3–1.2)
Total Protein: 7.6 g/dL (ref 6.5–8.1)

## 2022-03-13 LAB — URINALYSIS, ROUTINE W REFLEX MICROSCOPIC
Bilirubin Urine: NEGATIVE
Glucose, UA: NEGATIVE mg/dL
Hgb urine dipstick: NEGATIVE
Ketones, ur: 20 mg/dL — AB
Nitrite: NEGATIVE
Protein, ur: 30 mg/dL — AB
Specific Gravity, Urine: 1.015 (ref 1.005–1.030)
pH: 6 (ref 5.0–8.0)

## 2022-03-13 LAB — CBC
HCT: 42.8 % (ref 36.0–46.0)
Hemoglobin: 14.3 g/dL (ref 12.0–15.0)
MCH: 30 pg (ref 26.0–34.0)
MCHC: 33.4 g/dL (ref 30.0–36.0)
MCV: 89.9 fL (ref 80.0–100.0)
Platelets: 383 10*3/uL (ref 150–400)
RBC: 4.76 MIL/uL (ref 3.87–5.11)
RDW: 13 % (ref 11.5–15.5)
WBC: 9.6 10*3/uL (ref 4.0–10.5)
nRBC: 0 % (ref 0.0–0.2)

## 2022-03-13 LAB — LIPASE, BLOOD: Lipase: 29 U/L (ref 11–51)

## 2022-03-13 MED ORDER — IOHEXOL 300 MG/ML  SOLN
80.0000 mL | Freq: Once | INTRAMUSCULAR | Status: AC | PRN
  Administered 2022-03-13: 80 mL via INTRAVENOUS

## 2022-03-13 MED ORDER — ONDANSETRON HCL 4 MG/2ML IJ SOLN
4.0000 mg | Freq: Once | INTRAMUSCULAR | Status: AC
  Administered 2022-03-13: 4 mg via INTRAVENOUS
  Filled 2022-03-13: qty 2

## 2022-03-13 MED ORDER — ONDANSETRON HCL 4 MG/2ML IJ SOLN: 4.0000 mg | Freq: Once | INTRAMUSCULAR | Status: DC | PRN

## 2022-03-13 MED ORDER — ONDANSETRON HCL 4 MG PO TABS: 4.0000 mg | ORAL_TABLET | Freq: Every day | ORAL | 0 refills | Status: DC

## 2022-03-13 MED ORDER — SODIUM CHLORIDE 0.9 % IV BOLUS
1000.0000 mL | Freq: Once | INTRAVENOUS | Status: AC
  Administered 2022-03-13: 1000 mL via INTRAVENOUS

## 2022-03-13 NOTE — ED Notes (Signed)
This Probation officer gave the patient a pack of graham crackers and two packs of saltine crackers. I also gave the patent ginger ale as requested for the challenge.

## 2022-03-13 NOTE — Discharge Instructions (Signed)
You were seen today for nausea and vomiting.  Your work-up was reassuring.  Take Zofran as needed for nausea.  Make sure that you are staying hydrated.

## 2022-03-13 NOTE — ED Notes (Signed)
Patient transported to CT 

## 2022-03-13 NOTE — ED Provider Notes (Signed)
[facility] EMERGENCY DEPARTMENT Provider Note   CSN: [id number] Arrival date & time: 03/12/22  2358     History  Chief Complaint  Patient presents with   Vomiting    Teneil Shiller is a 76 y.o. female.  HPI     This is a 76 year old female with a history of hypertension, COPD, diastolic heart failure who presents with nausea and vomiting.  Patient reports 1 week history of nausea and vomiting.  She states she has been unable to keep anything down.  She reports crampy mid abdominal pain that does not radiate.  She reports normal bowel movements.  No known sick contacts.  Denies fever or urinary symptoms.  Home Medications Prior to Admission medications   Medication Sig Start Date End Date Taking? Authorizing Provider  acetaminophen (TYLENOL) 500 MG tablet Take 1,000 mg by mouth every 8 (eight) hours as needed for mild pain.    [provider]  albuterol (VENTOLIN HFA) 108 (90 Base) MCG/ACT inhaler Inhale 2 puffs into the lungs every 6 (six) hours as needed for wheezing or shortness of breath. 02/08/20   Brunetta Jeans, PA-C  aspirin EC 81 MG tablet Take 1 tablet (81 mg total) by mouth daily. 09/29/18   Bonnielee Haff, MD  atorvastatin (LIPITOR) 40 MG tablet Take 40 mg by mouth at bedtime.    [provider]  famotidine (PEPCID) 20 MG tablet Take 1 tablet (20 mg total) by mouth daily. 01/22/22   Godfrey Pick, MD  loperamide (IMODIUM) 2 MG capsule Take 1 capsule (2 mg total) by mouth every 12 (twelve) hours as needed for diarrhea or loose stools. 01/17/22   Kayleen Memos, DO  losartan (COZAAR) 50 MG tablet Take 50 mg by mouth daily.    [provider]  metoCLOPramide (REGLAN) 10 MG tablet Take 1 tablet (10 mg total) by mouth every 8 (eight) hours as needed for up to 20 doses for nausea. 01/22/22   Godfrey Pick, MD  metoprolol tartrate (LOPRESSOR) 25 MG tablet Take 1 tablet (25 mg total) by mouth 2 (two) times daily. 04/23/21   Kennyth Arnold,  FNP  Misc. Devices (PULSE OXIMETER FOR FINGER) MISC 1 Device by Does not apply route as needed. 03/11/19   Brunetta Jeans, PA-C  ondansetron (ZOFRAN) 4 MG tablet Take 1 tablet (4 mg total) by mouth daily. 03/13/22   Georgie Eduardo, Barbette Hair, MD  OVER THE COUNTER MEDICATION Take 1 tablet by mouth at bedtime. Blue tablet. [employer] or [facility] sleeping aid    [provider]  oxyCODONE-acetaminophen (PERCOCET/ROXICET) 5-325 MG tablet Take 1 tablet by mouth every 6 (six) hours as needed for severe pain. 02/13/22   Sponseller, Eugene Garnet R, PA-C  polyethylene glycol (MIRALAX) 17 g packet Take 17 g by mouth daily. 01/17/22   Kayleen Memos, DO  PROLIA 60 MG/ML SOSY injection Inject 60 mg into the skin every 6 (six) months. 05/16/21   [provider]  simethicone (GAS-X) 80 MG chewable tablet Chew 1 tablet (80 mg total) by mouth 4 (four) times daily as needed for up to 5 days for flatulence. 01/17/22 03/29/22  Kayleen Memos, DO  TRELEGY ELLIPTA 200-62.5-25 MCG/INH AEPB Inhale 1 puff into the lungs daily. 07/17/21   [provider]  valACYclovir (VALTREX) 1000 MG tablet Take 1,000 mg by mouth 2 (two) times daily. 01/30/22   [provider]      Allergies    Patient has no known allergies.  Review of Systems   Review of Systems  Constitutional:  Negative for fever.  Gastrointestinal:  Positive for abdominal pain, nausea and vomiting.  All other systems reviewed and are negative.  Physical Exam Updated Vital Signs BP (!) 157/98   Pulse 91   Temp 98.1 F (36.7 C) (Oral)   Resp 15   Ht 1.549 m ('5\' 1"'$ )   Wt 54.4 kg   SpO2 100%   BMI 22.67 kg/m  Physical Exam Vitals and nursing note reviewed.  Constitutional:      Appearance: She is well-developed. She is not ill-appearing.  HENT:     Head: Normocephalic and atraumatic.     Mouth/Throat:     Mouth: Mucous membranes are dry.  Eyes:     Pupils: Pupils are equal, round, and reactive to light.  Cardiovascular:      Rate and Rhythm: Normal rate and regular rhythm.     Heart sounds: Normal heart sounds.  Pulmonary:     Effort: Pulmonary effort is normal. No respiratory distress.     Breath sounds: No wheezing.  Abdominal:     General: Bowel sounds are normal.     Palpations: Abdomen is soft.     Tenderness: There is abdominal tenderness. There is no guarding or rebound.     Comments: Mid abdominal tenderness to palpation, no rebound or guarding  Musculoskeletal:     Cervical back: Neck supple.  Skin:    General: Skin is warm and dry.  Neurological:     Mental Status: She is alert and oriented to person, place, and time.  Psychiatric:        Mood and Affect: Mood normal.    ED Results / Procedures / Treatments   Labs (all labs ordered are listed, but only abnormal results are displayed) Labs Reviewed  COMPREHENSIVE METABOLIC PANEL - Abnormal; Notable for the following components:      Result Value   Sodium 133 (*)    Chloride 96 (*)    Glucose, Bld 117 (*)    Creatinine, Ser 1.55 (*)    Calcium 10.4 (*)    Total Bilirubin 1.3 (*)    GFR, Estimated 35 (*)    All other components within normal limits  URINALYSIS, ROUTINE W REFLEX MICROSCOPIC - Abnormal; Notable for the following components:   APPearance HAZY (*)    Ketones, ur 20 (*)    Protein, ur 30 (*)    Leukocytes,Ua MODERATE (*)    Bacteria, UA RARE (*)    All other components within normal limits  URINE CULTURE  LIPASE, BLOOD  CBC    EKG EKG Interpretation  Date/Time:  Thursday Mar 13 2022 03:51:11 EDT Ventricular Rate:  88 PR Interval:  146 QRS Duration: 95 QT Interval:  402 QTC Calculation: 487 R Axis:   7 Text Interpretation: Sinus rhythm Left atrial enlargement Left ventricular hypertrophy No significant change since last tracing Confirmed by Thayer Jew [phone]) on 03/13/2022 4:05:41 AM  Radiology CT ABDOMEN PELVIS W CONTRAST  Result Date: 03/13/2022 CLINICAL DATA:  Abdominal pain with nausea and vomiting.  EXAM: CT ABDOMEN AND PELVIS WITH CONTRAST TECHNIQUE: Multidetector CT imaging of the abdomen and pelvis was performed using the standard protocol following bolus administration of intravenous contrast. RADIATION DOSE REDUCTION: This exam was performed according to the departmental dose-optimization program which includes automated exposure control, adjustment of the mA and/or kV according to patient size and/or use of iterative reconstruction technique. CONTRAST:  76m OMNIPAQUE IOHEXOL 300 MG/ML  SOLN COMPARISON:  Feb 28, 2022 FINDINGS: Lower chest: No acute abnormality. Hepatobiliary: Multiple stable hepatic cysts are seen. Status post cholecystectomy. No biliary dilatation. Pancreas: Unremarkable. No pancreatic ductal dilatation or surrounding inflammatory changes. Spleen: Normal in size without focal abnormality. Adrenals/Urinary Tract: The right adrenal gland is not visualized. The left adrenal gland is unremarkable. The right kidney is surgically absent. The left kidney is normal, without renal calculi, focal lesion, or hydronephrosis. Bladder is unremarkable. Stomach/Bowel: Stomach is within normal limits. The appendix is surgically absent. No evidence of bowel wall thickening, distention, or inflammatory changes. Noninflamed diverticula are seen within the large bowel. This is most prominent within the descending and sigmoid colon. Vascular/Lymphatic: Aortic atherosclerosis with evidence of prior abdominal aortic endovascular stent graft placement. No enlarged abdominal or pelvic lymph nodes. Reproductive: Uterus and bilateral adnexa are unremarkable. Other: No abdominal wall hernia or abnormality. No abdominopelvic ascites. Musculoskeletal: A chronic compression fracture deformity of the L3 vertebral body is seen. Moderate severity degenerative changes are noted at the level of L5-S1. IMPRESSION: 1. Evidence of prior abdominal aortic endovascular stent graft placement. 2. Colonic diverticulosis. 3. Chronic  compression fracture deformity of the L3 vertebral body. 4. Evidence of prior cholecystectomy. 5. Aortic atherosclerosis. Aortic Atherosclerosis (ICD10-I70.0). Electronically Signed   By: Virgina Norfolk M.D.   On: 03/13/2022 03:16    Procedures Procedures    Medications Ordered in ED Medications  ondansetron (ZOFRAN) injection 4 mg (has no administration in time range)  ondansetron (ZOFRAN) injection 4 mg (4 mg Intravenous Given 03/13/22 0149)  sodium chloride 0.9 % bolus 1,000 mL (0 mLs Intravenous Stopped 03/13/22 0254)  iohexol (OMNIPAQUE) 300 MG/ML solution 80 mL (80 mLs Intravenous Contrast Given 03/13/22 0254)    ED Course/ Medical Decision Making/ A&P                           Medical Decision Making Amount and/or Complexity of Data Reviewed Labs: ordered. Radiology: ordered.  Risk Prescription drug management.   This patient presents to the ED for concern of nausea/vomiting this involves an extensive number of treatment options, and is a complaint that carries with it a high risk of complications and morbidity.  I considered the following differential and admission for this acute, potentially life threatening condition.  The differential diagnosis includes Gastritis, gastroenteritis, obstruction, less likely appendicitis  MDM:    This is a 76 year old female who presents with 1 week history of nausea, vomiting, abdominal pain.  She is nontoxic.  Vital signs are largely reassuring.  She has a history of the same.  Reports taking Zofran at home with minimal relief.  Also has had Imodium and Pepcid in the past.  Labs obtained.  No significant lab abnormalities.  No LFT or lipase derangement.  Creatinine is at baseline.  No significant leukocytosis.  CT abdomen pelvis obtained and does not show any significant intra-abdominal process including SBO.  Patient states she feels better after fluids and Zofran.  She is tolerating fluids.  Question whether she may have gastroparesis or  ongoing gastritis.  Recommend that she continue her Pepcid at home.  She needs to stay hydrated.  (Labs, imaging, consults)  Labs: I Ordered, and personally interpreted labs.  The pertinent results include: CBC, CMP, lipase  Imaging Studies ordered: I ordered imaging studies including CT I independently visualized and interpreted imaging. I agree with the radiologist interpretation  Additional history obtained from chart review.  External records from outside source  obtained and reviewed including prior evaluations  Cardiac Monitoring: The patient was maintained on a cardiac monitor.  I personally viewed and interpreted the cardiac monitored which showed an underlying rhythm of: Normal sinus rhythm  Reevaluation: After the interventions noted above, I reevaluated the patient and found that they have :improved  Social Determinants of Health: Lives independently  Disposition: Discharge  Co morbidities that complicate the patient evaluation  Past Medical History:  Diagnosis Date   AAA (abdominal aortic aneurysm) ([place])    Arthritis    Cholelithiasis 2011   s/p cholescystectomy    Chronic diastolic congestive heart failure ([facility])    COPD (chronic obstructive pulmonary disease) ([facility])    Emphysema    GERD (gastroesophageal reflux disease)    History of heartburn    History of renal cell carcinoma    Hypercholesteremia    Hypertension    Hypertension    Mitral regurgitation    Pneumonia 1980's   Renal cell carcinoma 01/2010   s/p right radical nephrectomy 01/2010,  followed by [facility]   S/P minimally invasive mitral valve repair 04/20/2019   Complex valvuloplasty including triangular resection of flail segment of posterior leaflet, artificial Gore-tex neochord placement x6 and 30 mm Sorin Memo 4D ring annuloplasty via right mini thoracotomy approach   Shortness of breath    Tuberculosis    Tests positive for PPD. dad had h/o TB.     Medicines Meds ordered this  encounter  Medications   ondansetron (ZOFRAN) injection 4 mg   ondansetron (ZOFRAN) injection 4 mg   sodium chloride 0.9 % bolus 1,000 mL   iohexol (OMNIPAQUE) 300 MG/ML solution 80 mL   ondansetron (ZOFRAN) 4 MG tablet    Sig: Take 1 tablet (4 mg total) by mouth daily.    Dispense:  20 tablet    Refill:  0    I have reviewed the patients home medicines and have made adjustments as needed  Problem List / ED Course: Problem List Items Addressed This Visit   None Visit Diagnoses     Nausea and vomiting in adult    -  Primary                   Final Clinical Impression(s) / ED Diagnoses Final diagnoses:  Nausea and vomiting in adult    Rx / DC Orders ED Discharge Orders          Ordered    ondansetron (ZOFRAN) 4 MG tablet  Daily        03/13/22 0406              Merryl Hacker, MD 03/13/22 [phone]

## 2022-03-14 ENCOUNTER — Encounter: Payer: Self-pay | Admitting: Gastroenterology

## 2022-03-14 ENCOUNTER — Ambulatory Visit: Payer: Medicare Other | Admitting: Gastroenterology

## 2022-03-14 VITALS — BP 138/86 | HR 95 | Ht 61.0 in | Wt 128.0 lb

## 2022-03-14 DIAGNOSIS — R112 Nausea with vomiting, unspecified: Secondary | ICD-10-CM

## 2022-03-14 LAB — URINE CULTURE

## 2022-03-14 MED ORDER — PANTOPRAZOLE SODIUM 40 MG PO TBEC: 40.0000 mg | DELAYED_RELEASE_TABLET | Freq: Every day | ORAL | 3 refills | Status: DC

## 2022-03-14 MED ORDER — PROMETHAZINE HCL 12.5 MG PO TABS: 12.5000 mg | ORAL_TABLET | Freq: Four times a day (QID) | ORAL | 0 refills | Status: DC | PRN

## 2022-03-14 MED ORDER — METOCLOPRAMIDE HCL 10 MG PO TABS: 10.0000 mg | ORAL_TABLET | Freq: Three times a day (TID) | ORAL | 0 refills | Status: DC | PRN

## 2022-03-14 NOTE — Patient Instructions (Addendum)
If you are age 74 or older, your body mass index should be between 23-30. Your Body mass index is 24.19 kg/m. If this is out of the aforementioned range listed, please consider follow up with your Primary Care Provider.  If you are age 64 or younger, your body mass index should be between 19-25. Your Body mass index is 24.19 kg/m. If this is out of the aformentioned range listed, please consider follow up with your Primary Care Provider.   ________________________________________________________  The [facility] providers would like to encourage you to use [facility] to communicate with providers for non-urgent requests or questions.  Due to long hold times on the telephone, sending your provider a message by [facility] may be a faster and more efficient way to get a response.  Please allow 48 business hours for a response.  Please remember that this is for non-urgent requests.  _______________________________________________________  We have sent the following medications to your pharmacy for you to pick up at your convenience: Protonix Phenergan and reglan  You have been scheduled for an endoscopy. Please follow written instructions given to you at your visit today. If you use inhalers (even only as needed), please bring them with you on the day of your procedure.  Stop all pain meds Stop smoking  Can do boost/Ensure 2 times a day  Make pulmonologist appointment.  Thank you,  Dr. Jackquline Denmark

## 2022-03-14 NOTE — Progress Notes (Signed)
Chief Complaint: N/V  Referring Provider:  Roselee Nova, MD      ASSESSMENT AND PLAN;   #1. Intractable N/V with neg CT AP x 2  #2. Weight loss with failure to thrive.   Plan: -Protonix '40mg'$  po QD #90 -Phenergan 12.5 mg po Q6hrs prn (sedation precautions) #45 -OK with reglan '10mg'$  po Q8 hrs x 1 week -EGD as soon as possible. -Stop all pain meds -Stop smoking -Boost/ensure BID -Sister to make appt with pulm -Discussed extensively with pt and pt's sister.  Certainly, if she is not able to keep anything down, she needs to go back to ED/urgent care. -If continued problems, repeat urine tox screen.   HPI:    Katie [name] is a 76 y.o. female  COPD with continued smoking (not on O2, followed by pulm), HTN, dCHF (Nl EF), PVD, HLD, AAA s/p graft, RCC s/p R nephrectomy, CKD3 (only one remaining kidney, GFR 37 ml/min), osteoporosis with L3 #, R wrist fracture  Seen as emergency workin  C/O intractable N/V since 12/2021 after her Rudene Anda (when she had [place] food) Progressively worsening. Cannot handle solids.  Has been on a liquid diet. Has significant heartburn.  No odynophagia or dysphagia.  Has some regurgitation. Wt loss on over 10lb in last month No hematemesis Multiple visits to ED. Most recently yesterday.  ED called-we worked her into GI clinic.  She had CT Abdo/pelvis with contrast twice most recently yesterday which did not show any acute abnormalities.  No small bowel obstruction.  All her labs are at baseline including creatinine 1.5, normal lipase.  Normal CBC with hemoglobin 14.3, WBC count 9.6.  Continues to smoke despite medical advice.  Most of the nausea/vomiting starts with coughing episodes.  No fever or chills.  No sputum.  She recently had fracture of her wrist R, has been on narcotics like oxycodone.  These did make her have constipation.  She is currently off all narcotics, per patient, over the last 10 days.  Continues to have problems.   Constipation is better.  Never had EGD/colonoscopy.  Due to nausea/vomiting, I do not believe she can handle colonoscopy preparation.  No sodas, chocolates, chewing gums, artificial sweeteners and candy. No NSAIDs  Denies use of marijuana or cocaine.  Surprisingly her cocaine urine test was positive on 01/2022.    Past GI work-up: CT AP with contrast.  03/13/2022 1. Evidence of prior abdominal aortic endovascular stent graft placement.  No acute abnormalities.  No small bowel obstruction. 2. Colonic diverticulosis. 3. Chronic compression fracture deformity of the L3 vertebral body. 4. Evidence of prior cholecystectomy. 5. Aortic atherosclerosis.  CT Abdo pelvis with contrast 02/28/2022 1. Evidence of prior cholecystectomy and right nephrectomy. 2. Colonic diverticulosis. 3. Multiple stable hepatic cysts. 4. Aortic atherosclerosis with interval abdominal aortic aneurysm repair since the prior exam. 5. Chronic compression fracture deformity of the L3 vertebral body.   Wt Readings from Last 3 Encounters:  03/14/22 128 lb (58.1 kg)  03/13/22 120 lb (54.4 kg)  02/28/22 137 lb (62.1 kg)     Past Medical History:  Diagnosis Date   AAA (abdominal aortic aneurysm) ([place])    Arthritis    Cholelithiasis 2011   s/p cholescystectomy    Chronic diastolic congestive heart failure (HCC)    COPD (chronic obstructive pulmonary disease) (HCC)    Emphysema    GERD (gastroesophageal reflux disease)    History of heartburn    History of renal cell carcinoma  Hypercholesteremia    Hypertension    Hypertension    Mitral regurgitation    Pneumonia 1980's   Renal cell carcinoma 01/2010   s/p right radical nephrectomy 01/2010,  followed by [facility]   S/P minimally invasive mitral valve repair 04/20/2019   Complex valvuloplasty including triangular resection of flail segment of posterior leaflet, artificial Gore-tex neochord placement x6 and 30 mm Sorin Memo 4D ring annuloplasty via  right mini thoracotomy approach   Shortness of breath    Tuberculosis    Tests positive for PPD. dad had h/o TB.    Past Surgical History:  Procedure Laterality Date   ABDOMINAL AORTIC ENDOVASCULAR STENT GRAFT N/A 01/10/2022   Procedure: ABDOMINAL AORTIC ENDOVASCULAR STENT GRAFT;  Surgeon: Marty Heck, MD;  Location: [place];  Service: Vascular;  Laterality: N/A;   APPENDECTOMY  1970's   BUBBLE STUDY  03/18/2019   Procedure: BUBBLE STUDY;  Surgeon: Fay Records, MD;  Location: [facility];  Service: Cardiovascular;;   CARDIAC CATHETERIZATION  09/2011   CHOLECYSTECTOMY  01/2010   DIAGNOSTIC LAPAROSCOPY     KIDNEY SURGERY     LEFT AND RIGHT HEART CATHETERIZATION WITH CORONARY ANGIOGRAM N/A 09/30/2011   Procedure: LEFT AND RIGHT HEART CATHETERIZATION WITH CORONARY ANGIOGRAM;  Surgeon: Candee Furbish, MD;  Location[facility] CATH LAB;  Service: Cardiovascular;  Laterality: N/A;   MITRAL VALVE REPAIR Right 04/20/2019   Procedure: MINIMALLY INVASIVE MITRAL VALVE REPAIR (MVR) USING MEMO 4D SIZE 30;  Surgeon: Rexene Alberts, MD;  Location: [place];  Service: Open Heart Surgery;  Laterality: Right;   MULTIPLE EXTRACTIONS WITH ALVEOLOPLASTY  10/06/2011   Procedure: MULTIPLE EXTRACION WITH ALVEOLOPLASTY;  Surgeon: Lenn Cal, DDS;  Location: [place];  Service: Oral Surgery;  Laterality: N/A;  Multiple extraction of tooth #'s 1, 6, 8, 10, 11, 22, 23, 26, 27, 28, 29 with alveoloplasty and Upper right buccal exostoses reductions.   NEPHRECTOMY RADICAL  01/2010   right    ORIF WRIST FRACTURE Right 02/17/2022   Procedure: OPEN REDUCTION INTERNAL FIXATION (ORIF) WRIST FRACTURE;  Surgeon: Iran Planas, MD;  Location: [place];  Service: Orthopedics;  Laterality: Right;  with IV sedation   OVARIAN CYST REMOVAL  1970's   "went through belly button"   RIGHT/LEFT HEART CATH AND CORONARY ANGIOGRAPHY N/A 03/22/2019   Procedure: RIGHT/LEFT HEART CATH AND CORONARY ANGIOGRAPHY;  Surgeon: [name], MD;   Location: [facility] CV LAB;  Service: Cardiovascular;  Laterality: N/A;   TEE WITHOUT CARDIOVERSION  10/01/2011   Procedure: TRANSESOPHAGEAL ECHOCARDIOGRAM (TEE);  Surgeon: [name];  Location: [facility];  Service: Cardiovascular;  Laterality: N/A;   TEE WITHOUT CARDIOVERSION N/A 03/18/2019   Procedure: TRANSESOPHAGEAL ECHOCARDIOGRAM (TEE);  Surgeon: Fay Records, MD;  Location: [place];  Service: Cardiovascular;  Laterality: N/A;   TEE WITHOUT CARDIOVERSION N/A 04/20/2019   Procedure: TRANSESOPHAGEAL ECHOCARDIOGRAM (TEE);  Surgeon: Rexene Alberts, MD;  Location: [facility];  Service: Open Heart Surgery;  Laterality: N/A;   TUBAL LIGATION  1970's   US ECHOCARDIOGRAPHY  09/2011    Family History  Problem Relation Age of Onset   COPD Mother        DECEASED/SMOKED   Diabetes Brother    Diabetes Brother    Colon cancer Neg Hx    Rectal cancer Neg Hx    Esophageal cancer Neg Hx    Stomach cancer Neg Hx     Social History   Tobacco Use   Smoking status: Every  Day    Packs/day: 0.50    Years: 53.00    Pack years: 26.50    Types: Cigarettes    Last attempt to quit: 03/18/2019    Years since quitting: 2.9    Passive exposure: Current (Son who lives with her Smokes)   Smokeless tobacco: Never  Vaping Use   Vaping Use: Never used  Substance Use Topics   Alcohol use: Not Currently   Drug use: Not Currently    Types: Cocaine    Comment: pos drug test in epic on 02/07/22    Current Outpatient Medications  Medication Sig Dispense Refill   acetaminophen (TYLENOL) 500 MG tablet Take 1,000 mg by mouth every 8 (eight) hours as needed for mild pain.     albuterol (VENTOLIN HFA) 108 (90 Base) MCG/ACT inhaler Inhale 2 puffs into the lungs every 6 (six) hours as needed for wheezing or shortness of breath. 8 g 5   aspirin EC 81 MG tablet Take 1 tablet (81 mg total) by mouth daily. 30 tablet 0   atorvastatin (LIPITOR) 40 MG tablet Take 40 mg by mouth at bedtime.     famotidine  (PEPCID) 20 MG tablet Take 1 tablet (20 mg total) by mouth daily. 30 tablet 0   loperamide (IMODIUM) 2 MG capsule Take 1 capsule (2 mg total) by mouth every 12 (twelve) hours as needed for diarrhea or loose stools. 30 capsule 0   losartan (COZAAR) 50 MG tablet Take 50 mg by mouth daily.     metoCLOPramide (REGLAN) 10 MG tablet Take 1 tablet (10 mg total) by mouth every 8 (eight) hours as needed for up to 20 doses for nausea. 20 tablet 0   metoprolol tartrate (LOPRESSOR) 25 MG tablet Take 1 tablet (25 mg total) by mouth 2 (two) times daily. 60 tablet 2   Misc. Devices (PULSE OXIMETER FOR FINGER) MISC 1 Device by Does not apply route as needed. 1 each 0   ondansetron (ZOFRAN) 4 MG tablet Take 1 tablet (4 mg total) by mouth daily. 20 tablet 0   oxyCODONE-acetaminophen (PERCOCET/ROXICET) 5-325 MG tablet Take 1 tablet by mouth every 6 (six) hours as needed for severe pain. 12 tablet 0   polyethylene glycol (MIRALAX) 17 g packet Take 17 g by mouth daily. 14 each 0   PROLIA 60 MG/ML SOSY injection Inject 60 mg into the skin every 6 (six) months.     simethicone (GAS-X) 80 MG chewable tablet Chew 1 tablet (80 mg total) by mouth 4 (four) times daily as needed for up to 5 days for flatulence. 20 tablet 0   TRELEGY ELLIPTA 200-62.5-25 MCG/INH AEPB Inhale 1 puff into the lungs daily.     valACYclovir (VALTREX) 1000 MG tablet Take 1,000 mg by mouth 2 (two) times daily.     No current facility-administered medications for this visit.    No Known Allergies  Review of Systems:  Constitutional: Denies fever, chills, diaphoresis, appetite change and fatigue.  HEENT: Denies photophobia, eye pain, redness, hearing loss, ear pain, congestion, sore throat, rhinorrhea, sneezing, mouth sores, neck pain, neck stiffness and tinnitus.   Respiratory: Denies SOB, DOE, has cough, No chest tightness,  and wheezing.   Cardiovascular: Denies chest pain, palpitations and leg swelling.  Genitourinary: Denies dysuria, urgency,  frequency, hematuria, flank pain and difficulty urinating.  Musculoskeletal: Has myalgias, back pain, joint swelling, arthralgias and gait problem.  Skin: No rash.  Neurological: Denies dizziness, seizures, syncope, weakness, light-headedness, numbness and headaches.  Hematological: Denies adenopathy.  Easy bruising, personal or family bleeding history  Psychiatric/Behavioral: Has anxiety or depression     Physical Exam:    BP 138/86   Pulse 95   Ht '5\' 1"'$  (1.549 m)   Wt 128 lb (58.1 kg)   SpO2 99%   BMI 24.19 kg/m  Wt Readings from Last 3 Encounters:  03/14/22 128 lb (58.1 kg)  03/13/22 120 lb (54.4 kg)  02/28/22 137 lb (62.1 kg)   Constitutional:  Well-developed, in no acute distress. Psychiatric: Normal mood and affect. Behavior is normal. HEENT: Pupils normal.  Conjunctivae are normal. No scleral icterus. Neck supple.  Cardiovascular: Normal rate, regular rhythm. No edema Pulmonary/chest: Bilateral decreased breath sounds with prolonged expiratory phase.  No wheezing, rales or rhonchi. Abdominal: Soft, nondistended.  Mild epigastric tenderness.  Bowel sounds active throughout. There are no masses palpable. No hepatomegaly. Rectal: Deferred Neurological: Alert and oriented to person place and time. Skin: Skin is warm and dry. No rashes noted.  Data Reviewed: I have personally reviewed following labs and imaging studies  CBC:    Latest Ref Rng & Units 03/13/2022   12:10 AM 02/27/2022    8:38 PM 02/07/2022    4:22 AM  CBC  WBC 4.0 - 10.5 K/uL 9.6   8.9   7.4    Hemoglobin 12.0 - 15.0 g/dL 14.3   13.3   13.6    Hematocrit 36.0 - 46.0 % 42.8   38.2   39.8    Platelets 150 - 400 K/uL 383   419   341      CMP:    Latest Ref Rng & Units 03/13/2022   12:10 AM 02/27/2022    8:38 PM 02/07/2022    4:22 AM  CMP  Glucose 70 - 99 mg/dL 117   102   121    BUN 8 - 23 mg/dL '18   16   21    '$ Creatinine 0.44 - 1.00 mg/dL 1.55   1.57   1.78    Sodium 135 - 145 mmol/L 133   133   132     Potassium 3.5 - 5.1 mmol/L 3.5   3.5   3.7    Chloride 98 - 111 mmol/L 96   96   101    CO2 22 - 32 mmol/L '24   24   19    '$ Calcium 8.9 - 10.3 mg/dL 10.4   10.1   9.6    Total Protein 6.5 - 8.1 g/dL 7.6   7.4   7.1    Total Bilirubin 0.3 - 1.2 mg/dL 1.3   1.1   1.0    Alkaline Phos 38 - 126 U/L 71   63   63    AST 15 - 41 U/L '25   23   21    '$ ALT 0 - 44 U/L '24   14   14      '$ GFR: Estimated Creatinine Clearance: 26.1 mL/min (A) (by C-G formula based on SCr of 1.55 mg/dL (H)). Liver Function Tests: Recent Labs  Lab 03/13/22 0010  AST 25  ALT 24  ALKPHOS 71  BILITOT 1.3*  PROT 7.6  ALBUMIN 4.0   Recent Labs  Lab 03/13/22 0010  LIPASE 29       Carmell Austria, MD 03/14/2022, 9:25 AM  Cc: Roselee Nova, MD

## 2022-03-19 ENCOUNTER — Ambulatory Visit: Payer: Medicare Other | Admitting: Nurse Practitioner

## 2022-04-02 ENCOUNTER — Encounter: Payer: Self-pay | Admitting: Gastroenterology

## 2022-04-02 ENCOUNTER — Ambulatory Visit (AMBULATORY_SURGERY_CENTER): Payer: Medicare Other | Admitting: Gastroenterology

## 2022-04-02 VITALS — BP 176/93 | HR 83 | Temp 96.0°F | Resp 18 | Ht 61.0 in | Wt 128.0 lb

## 2022-04-02 DIAGNOSIS — K2970 Gastritis, unspecified, without bleeding: Secondary | ICD-10-CM

## 2022-04-02 DIAGNOSIS — K2100 Gastro-esophageal reflux disease with esophagitis, without bleeding: Secondary | ICD-10-CM

## 2022-04-02 DIAGNOSIS — R112 Nausea with vomiting, unspecified: Secondary | ICD-10-CM

## 2022-04-02 DIAGNOSIS — K2210 Ulcer of esophagus without bleeding: Secondary | ICD-10-CM | POA: Diagnosis not present

## 2022-04-02 DIAGNOSIS — K209 Esophagitis, unspecified without bleeding: Secondary | ICD-10-CM

## 2022-04-02 MED ORDER — SODIUM CHLORIDE 0.9 % IV SOLN: 500.0000 mL | Freq: Once | INTRAVENOUS | Status: DC

## 2022-04-02 NOTE — Op Note (Signed)
[place] [place] [facility] Patient Name: Katie [name] Procedure Date: 04/02/2022 9:51 AM MRN: [id number] Endoscopist: Jackquline Denmark , MD Age: 76 Referring MD:  Date of Birth: Sep 10, 1946 Gender: Female Account #: [id number] Procedure:                Upper GI endoscopy Indications:              H/O N/V Medicines:                Monitored Anesthesia Care Procedure:                Pre-Anesthesia Assessment:                           - Prior to the procedure, a History and Physical                            was performed, and patient medications and                            allergies were reviewed. The patient's tolerance of                            previous anesthesia was also reviewed. The risks                            and benefits of the procedure and the sedation                            options and risks were discussed with the patient.                            All questions were answered, and informed consent                            was obtained. Prior Anticoagulants: The patient has                            taken no previous anticoagulant or antiplatelet                            agents. ASA Grade Assessment: II - A patient with                            mild systemic disease. After reviewing the risks                            and benefits, the patient was deemed in                            satisfactory condition to undergo the procedure.                           After obtaining informed consent, the endoscope was  passed under direct vision. Throughout the                            procedure, the patient's blood pressure, pulse, and                            oxygen saturations were monitored continuously. The                            Endoscope was introduced through the mouth, and                            advanced to the second part of duodenum. The upper                            GI endoscopy was accomplished without  difficulty.                            The patient tolerated the procedure well. Scope In: Scope Out: Findings:                 LA Grade A (one or more mucosal breaks less than 5                            mm, not extending between tops of 2 mucosal folds)                            esophagitis with no bleeding was found 35 cm from                            the incisors. Biopsies were taken with a cold                            forceps for histology.                           Localized mild inflammation characterized by                            erythema was found in the gastric body and in the                            gastric antrum. Biopsies were taken with a cold                            forceps for histology.                           The examined duodenum was normal. Biopsies for                            histology were taken with a cold forceps for  evaluation of celiac disease. Complications:            No immediate complications. Her preoperative BP was                            elevated 187/113. After propofol came back to                            normal. Estimated Blood Loss:     Estimated blood loss: none. Impression:               - LA Grade A reflux esophagitis with no bleeding.                            Biopsied.                           - Mild gastritis. Biopsied.                           - Normal examined duodenum. Biopsied. Recommendation:           - Patient has a contact number available for                            emergencies. The signs and symptoms of potential                            delayed complications were discussed with the                            patient. Return to normal activities tomorrow.                            Written discharge instructions were provided to the                            patient.                           - Resume previous diet.                           - Continue present medications  including Protonix                            40 mg p.o. once a day.                           - Advised to restart BP medicines and FU with Dr                            Manuella Ghazi                           - Await pathology results.                           -  The findings and recommendations were discussed                            with the patient's family. Jackquline Denmark, MD 04/02/2022 10:12:54 AM This report has been signed electronically.

## 2022-04-02 NOTE — Patient Instructions (Signed)
YOU HAD AN ENDOSCOPIC PROCEDURE TODAY AT [facility]:   Refer to the procedure report that was given to you for any specific questions about what was found during the examination.  If the procedure report does not answer your questions, please call your gastroenterologist to clarify.  If you requested that your care partner not be given the details of your procedure findings, then the procedure report has been included in a sealed envelope for you to review at your convenience later.  YOU SHOULD EXPECT: Some feelings of bloating in the abdomen. Passage of more gas than usual.  Walking can help get rid of the air that was put into your GI tract during the procedure and reduce the bloating. If you had a lower endoscopy (such as a colonoscopy or flexible sigmoidoscopy) you may notice spotting of blood in your stool or on the toilet paper. If you underwent a bowel prep for your procedure, you may not have a normal bowel movement for a few days.  Please Note:  You might notice some irritation and congestion in your nose or some drainage.  This is from the oxygen used during your procedure.  There is no need for concern and it should clear up in a day or so.  SYMPTOMS TO REPORT IMMEDIATELY:  Following upper endoscopy (EGD)  Vomiting of blood or coffee ground material  New chest pain or pain under the shoulder blades  Painful or persistently difficult swallowing  New shortness of breath  Fever of 100F or higher  Black, tarry-looking stools  For urgent or emergent issues, a gastroenterologist can be reached at any hour by calling [phone]. Do not use MyChart messaging for urgent concerns.    DIET:  We do recommend a small meal at first, but then you may proceed to your regular diet.  Drink plenty of fluids but you should avoid alcoholic beverages for 24 hours.  ACTIVITY:  You should plan to take it easy for the rest of today and you should NOT DRIVE or use heavy machinery until  tomorrow (because of the sedation medicines used during the test).    FOLLOW UP: Our staff will call the number listed on your records 24-72 hours following your procedure to check on you and address any questions or concerns that you may have regarding the information given to you following your procedure. If we do not reach you, we will leave a message.  We will attempt to reach you two times.  During this call, we will ask if you have developed any symptoms of COVID 19. If you develop any symptoms (ie: fever, flu-like symptoms, shortness of breath, cough etc.) before then, please call [phone].  If you test positive for Covid 19 in the 2 weeks post procedure, please call and report this information to [place].    If any biopsies were taken you will be contacted by phone or by letter within the next 1-3 weeks.  Please call us at [phone] if you have not heard about the biopsies in 3 weeks.    SIGNATURES/CONFIDENTIALITY: You and/or your care partner have signed paperwork which will be entered into your electronic medical record.  These signatures attest to the fact that that the information above on your After Visit Summary has been reviewed and is understood.  Full responsibility of the confidentiality of this discharge information lies with you and/or your care-partner.

## 2022-04-02 NOTE — Progress Notes (Signed)
Pt's states no medical or surgical changes since previsit or office visit. 

## 2022-04-02 NOTE — Progress Notes (Signed)
Vss nad trans to pacu °

## 2022-04-02 NOTE — Progress Notes (Signed)
Chief Complaint: N/V  Referring Provider:  Roselee Nova, MD      ASSESSMENT AND PLAN;   #1. Intractable N/V with neg CT AP x 2  #2. Weight loss with failure to thrive.   Plan: -Protonix '40mg'$  po QD #90 -Phenergan 12.5 mg po Q6hrs prn (sedation precautions) #45 -OK with reglan '10mg'$  po Q8 hrs x 1 week -EGD as soon as possible. -Stop all pain meds -Stop smoking -Boost/ensure BID -Sister to make appt with pulm -Discussed extensively with pt and pt's sister.  Certainly, if she is not able to keep anything down, she needs to go back to ED/urgent care. -If continued problems, repeat urine tox screen.   HPI:    Katie [name] is a 76 y.o. female  COPD with continued smoking (not on O2, followed by pulm), HTN, dCHF (Nl EF), PVD, HLD, AAA s/p graft, RCC s/p R nephrectomy, CKD3 (only one remaining kidney, GFR 37 ml/min), osteoporosis with L3 #, R wrist fracture  Seen as emergency workin  C/O intractable N/V since 12/2021 after her Rudene Anda (when she had [place] food) Progressively worsening. Cannot handle solids.  Has been on a liquid diet. Has significant heartburn.  No odynophagia or dysphagia.  Has some regurgitation. Wt loss on over 10lb in last month No hematemesis Multiple visits to ED. Most recently yesterday.  ED called-we worked her into GI clinic.  She had CT Abdo/pelvis with contrast twice most recently yesterday which did not show any acute abnormalities.  No small bowel obstruction.  All her labs are at baseline including creatinine 1.5, normal lipase.  Normal CBC with hemoglobin 14.3, WBC count 9.6.  Continues to smoke despite medical advice.  Most of the nausea/vomiting starts with coughing episodes.  No fever or chills.  No sputum.  She recently had fracture of her wrist R, has been on narcotics like oxycodone.  These did make her have constipation.  She is currently off all narcotics, per patient, over the last 10 days.  Continues to have problems.   Constipation is better.  Never had EGD/colonoscopy.  Due to nausea/vomiting, I do not believe she can handle colonoscopy preparation.  No sodas, chocolates, chewing gums, artificial sweeteners and candy. No NSAIDs  Denies use of marijuana or cocaine.  Surprisingly her cocaine urine test was positive on 01/2022.    Past GI work-up: CT AP with contrast.  03/13/2022 1. Evidence of prior abdominal aortic endovascular stent graft placement.  No acute abnormalities.  No small bowel obstruction. 2. Colonic diverticulosis. 3. Chronic compression fracture deformity of the L3 vertebral body. 4. Evidence of prior cholecystectomy. 5. Aortic atherosclerosis.  CT Abdo pelvis with contrast 02/28/2022 1. Evidence of prior cholecystectomy and right nephrectomy. 2. Colonic diverticulosis. 3. Multiple stable hepatic cysts. 4. Aortic atherosclerosis with interval abdominal aortic aneurysm repair since the prior exam. 5. Chronic compression fracture deformity of the L3 vertebral body.   Wt Readings from Last 3 Encounters:  04/02/22 128 lb (58.1 kg)  03/14/22 128 lb (58.1 kg)  03/13/22 120 lb (54.4 kg)     Past Medical History:  Diagnosis Date   AAA (abdominal aortic aneurysm) ([place])    Arthritis    Cholelithiasis 2011   s/p cholescystectomy    Chronic diastolic congestive heart failure (HCC)    COPD (chronic obstructive pulmonary disease) (HCC)    Emphysema    GERD (gastroesophageal reflux disease)    History of heartburn    History of renal cell carcinoma  Hypercholesteremia    Hypertension    Hypertension    Mitral regurgitation    Pneumonia 1980's   Renal cell carcinoma 01/2010   s/p right radical nephrectomy 01/2010,  followed by [facility]   S/P minimally invasive mitral valve repair 04/20/2019   Complex valvuloplasty including triangular resection of flail segment of posterior leaflet, artificial Gore-tex neochord placement x6 and 30 mm Sorin Memo 4D ring annuloplasty via  right mini thoracotomy approach   Shortness of breath    Tuberculosis    Tests positive for PPD. dad had h/o TB.    Past Surgical History:  Procedure Laterality Date   ABDOMINAL AORTIC ENDOVASCULAR STENT GRAFT N/A 01/10/2022   Procedure: ABDOMINAL AORTIC ENDOVASCULAR STENT GRAFT;  Surgeon: Marty Heck, MD;  Location: [place];  Service: Vascular;  Laterality: N/A;   APPENDECTOMY  1970's   BUBBLE STUDY  03/18/2019   Procedure: BUBBLE STUDY;  Surgeon: Fay Records, MD;  Location: [place];  Service: Cardiovascular;;   CARDIAC CATHETERIZATION  09/2011   CHOLECYSTECTOMY  01/2010   DIAGNOSTIC LAPAROSCOPY     KIDNEY SURGERY     LEFT AND RIGHT HEART CATHETERIZATION WITH CORONARY ANGIOGRAM N/A 09/30/2011   Procedure: LEFT AND RIGHT HEART CATHETERIZATION WITH CORONARY ANGIOGRAM;  Surgeon: Candee Furbish, MD;  Location: [facility] CATH LAB;  Service: Cardiovascular;  Laterality: N/A;   MITRAL VALVE REPAIR Right 04/20/2019   Procedure: MINIMALLY INVASIVE MITRAL VALVE REPAIR (MVR) USING MEMO 4D SIZE 30;  Surgeon: Rexene Alberts, MD;  Location: [place];  Service: Open Heart Surgery;  Laterality: Right;   MULTIPLE EXTRACTIONS WITH ALVEOLOPLASTY  10/06/2011   Procedure: MULTIPLE EXTRACION WITH ALVEOLOPLASTY;  Surgeon: Lenn Cal, DDS;  Location: [place];  Service: Oral Surgery;  Laterality: N/A;  Multiple extraction of tooth #'s 1, 6, 8, 10, 11, 22, 23, 26, 27, 28, 29 with alveoloplasty and Upper right buccal exostoses reductions.   NEPHRECTOMY RADICAL  01/2010   right    ORIF WRIST FRACTURE Right 02/17/2022   Procedure: OPEN REDUCTION INTERNAL FIXATION (ORIF) WRIST FRACTURE;  Surgeon: Iran Planas, MD;  Location: [place];  Service: Orthopedics;  Laterality: Right;  with IV sedation   OVARIAN CYST REMOVAL  1970's   "went through belly button"   RIGHT/LEFT HEART CATH AND CORONARY ANGIOGRAPHY N/A 03/22/2019   Procedure: RIGHT/LEFT HEART CATH AND CORONARY ANGIOGRAPHY;  Surgeon: [name], MD;   Location: [place] CV LAB;  Service: Cardiovascular;  Laterality: N/A;   TEE WITHOUT CARDIOVERSION  10/01/2011   Procedure: TRANSESOPHAGEAL ECHOCARDIOGRAM (TEE);  Surgeon: [name];  Location: [place];  Service: Cardiovascular;  Laterality: N/A;   TEE WITHOUT CARDIOVERSION N/A 03/18/2019   Procedure: TRANSESOPHAGEAL ECHOCARDIOGRAM (TEE);  Surgeon: Fay Records, MD;  Location: [place];  Service: Cardiovascular;  Laterality: N/A;   TEE WITHOUT CARDIOVERSION N/A 04/20/2019   Procedure: TRANSESOPHAGEAL ECHOCARDIOGRAM (TEE);  Surgeon: Rexene Alberts, MD;  Location: [place];  Service: Open Heart Surgery;  Laterality: N/A;   TUBAL LIGATION  1970's   US ECHOCARDIOGRAPHY  09/2011    Family History  Problem Relation Age of Onset   COPD Mother        DECEASED/SMOKED   Diabetes Brother    Diabetes Brother    Colon cancer Neg Hx    Rectal cancer Neg Hx    Esophageal cancer Neg Hx    Stomach cancer Neg Hx     Social History   Tobacco Use   Smoking status: Every  Day    Packs/day: 0.50    Years: 53.00    Total pack years: 26.50    Types: Cigarettes    Last attempt to quit: 03/18/2019    Years since quitting: 3.0    Passive exposure: Current (Son who lives with her Smokes)   Smokeless tobacco: Never  Vaping Use   Vaping Use: Never used  Substance Use Topics   Alcohol use: Not Currently   Drug use: Not Currently    Types: Cocaine    Comment: pos drug test in epic on 02/07/22    Current Outpatient Medications  Medication Sig Dispense Refill   albuterol (VENTOLIN HFA) 108 (90 Base) MCG/ACT inhaler Inhale 2 puffs into the lungs every 6 (six) hours as needed for wheezing or shortness of breath. 8 g 5   aspirin EC 81 MG tablet Take 1 tablet (81 mg total) by mouth daily. 30 tablet 0   acetaminophen (TYLENOL) 500 MG tablet Take 1,000 mg by mouth every 8 (eight) hours as needed for mild pain.     atorvastatin (LIPITOR) 40 MG tablet Take 40 mg by mouth at bedtime.      famotidine (PEPCID) 20 MG tablet Take 1 tablet (20 mg total) by mouth daily. 30 tablet 0   loperamide (IMODIUM) 2 MG capsule Take 1 capsule (2 mg total) by mouth every 12 (twelve) hours as needed for diarrhea or loose stools. 30 capsule 0   losartan (COZAAR) 50 MG tablet Take 50 mg by mouth daily.     metoCLOPramide (REGLAN) 10 MG tablet Take 1 tablet (10 mg total) by mouth every 8 (eight) hours as needed for up to 20 doses for nausea. For 1 week 20 tablet 0   metoprolol tartrate (LOPRESSOR) 25 MG tablet Take 1 tablet (25 mg total) by mouth 2 (two) times daily. 60 tablet 2   Misc. Devices (PULSE OXIMETER FOR FINGER) MISC 1 Device by Does not apply route as needed. 1 each 0   ondansetron (ZOFRAN) 4 MG tablet Take 1 tablet (4 mg total) by mouth daily. 20 tablet 0   oxyCODONE-acetaminophen (PERCOCET/ROXICET) 5-325 MG tablet Take 1 tablet by mouth every 6 (six) hours as needed for severe pain. 12 tablet 0   pantoprazole (PROTONIX) 40 MG tablet Take 1 tablet (40 mg total) by mouth daily. 90 tablet 3   polyethylene glycol (MIRALAX) 17 g packet Take 17 g by mouth daily. 14 each 0   PROLIA 60 MG/ML SOSY injection Inject 60 mg into the skin every 6 (six) months.     promethazine (PHENERGAN) 12.5 MG tablet Take 1 tablet (12.5 mg total) by mouth every 6 (six) hours as needed for nausea or vomiting. 45 tablet 0   simethicone (GAS-X) 80 MG chewable tablet Chew 1 tablet (80 mg total) by mouth 4 (four) times daily as needed for up to 5 days for flatulence. 20 tablet 0   TRELEGY ELLIPTA 200-62.5-25 MCG/INH AEPB Inhale 1 puff into the lungs daily.     valACYclovir (VALTREX) 1000 MG tablet Take 1,000 mg by mouth 2 (two) times daily.     Current Facility-Administered Medications  Medication Dose Route Frequency Provider Last Rate Last Admin   0.9 %  sodium chloride infusion  500 mL Intravenous Once Jackquline Denmark, MD        No Known Allergies  Review of Systems:  Constitutional: Denies fever, chills,  diaphoresis, appetite change and fatigue.  HEENT: Denies photophobia, eye pain, redness, hearing loss, ear pain, congestion, sore throat,  rhinorrhea, sneezing, mouth sores, neck pain, neck stiffness and tinnitus.   Respiratory: Denies SOB, DOE, has cough, No chest tightness,  and wheezing.   Cardiovascular: Denies chest pain, palpitations and leg swelling.  Genitourinary: Denies dysuria, urgency, frequency, hematuria, flank pain and difficulty urinating.  Musculoskeletal: Has myalgias, back pain, joint swelling, arthralgias and gait problem.  Skin: No rash.  Neurological: Denies dizziness, seizures, syncope, weakness, light-headedness, numbness and headaches.  Hematological: Denies adenopathy. Easy bruising, personal or family bleeding history  Psychiatric/Behavioral: Has anxiety or depression     Physical Exam:    BP (!) 151/129   Pulse 98   Temp (!) 96 F (35.6 C)   Ht '5\' 1"'$  (1.549 m)   Wt 128 lb (58.1 kg)   SpO2 98%   BMI 24.19 kg/m  Wt Readings from Last 3 Encounters:  04/02/22 128 lb (58.1 kg)  03/14/22 128 lb (58.1 kg)  03/13/22 120 lb (54.4 kg)   Constitutional:  Well-developed, in no acute distress. Psychiatric: Normal mood and affect. Behavior is normal. HEENT: Pupils normal.  Conjunctivae are normal. No scleral icterus. Neck supple.  Cardiovascular: Normal rate, regular rhythm. No edema Pulmonary/chest: Bilateral decreased breath sounds with prolonged expiratory phase.  No wheezing, rales or rhonchi. Abdominal: Soft, nondistended.  Mild epigastric tenderness.  Bowel sounds active throughout. There are no masses palpable. No hepatomegaly. Rectal: Deferred Neurological: Alert and oriented to person place and time. Skin: Skin is warm and dry. No rashes noted.  Data Reviewed: I have personally reviewed following labs and imaging studies  CBC:    Latest Ref Rng & Units 03/13/2022   12:10 AM 02/27/2022    8:38 PM 02/07/2022    4:22 AM  CBC  WBC 4.0 - 10.5 K/uL 9.6   8.9  7.4   Hemoglobin 12.0 - 15.0 g/dL 14.3  13.3  13.6   Hematocrit 36.0 - 46.0 % 42.8  38.2  39.8   Platelets 150 - 400 K/uL 383  419  341     CMP:    Latest Ref Rng & Units 03/13/2022   12:10 AM 02/27/2022    8:38 PM 02/07/2022    4:22 AM  CMP  Glucose 70 - 99 mg/dL 117  102  121   BUN 8 - 23 mg/dL '18  16  21   '$ Creatinine 0.44 - 1.00 mg/dL 1.55  1.57  1.78   Sodium 135 - 145 mmol/L 133  133  132   Potassium 3.5 - 5.1 mmol/L 3.5  3.5  3.7   Chloride 98 - 111 mmol/L 96  96  101   CO2 22 - 32 mmol/L '24  24  19   '$ Calcium 8.9 - 10.3 mg/dL 10.4  10.1  9.6   Total Protein 6.5 - 8.1 g/dL 7.6  7.4  7.1   Total Bilirubin 0.3 - 1.2 mg/dL 1.3  1.1  1.0   Alkaline Phos 38 - 126 U/L 71  63  63   AST 15 - 41 U/L '25  23  21   '$ ALT 0 - 44 U/L '24  14  14     '$ GFR: Estimated Creatinine Clearance: 26.1 mL/min (A) (by C-G formula based on SCr of 1.55 mg/dL (H)). Liver Function Tests: No results for input(s): "AST", "ALT", "ALKPHOS", "BILITOT", "PROT", "ALBUMIN" in the last 168 hours.  No results for input(s): "LIPASE", "AMYLASE" in the last 168 hours.      Carmell Austria, MD 04/02/2022, 9:52 AM  Cc: Roselee Nova,  MD

## 2022-04-02 NOTE — Progress Notes (Signed)
Called to room to assist during endoscopic procedure.  Patient ID and intended procedure confirmed with present staff. Received instructions for my participation in the procedure from the performing physician.  

## 2022-04-03 ENCOUNTER — Telehealth: Payer: Self-pay | Admitting: *Deleted

## 2022-04-03 ENCOUNTER — Telehealth: Payer: Self-pay

## 2022-04-03 NOTE — Telephone Encounter (Signed)
No answer, unable to leave a message, B.Ervine Witucki RN. 

## 2022-04-03 NOTE — Telephone Encounter (Signed)
No answer on second attempt follow up call.

## 2022-04-06 ENCOUNTER — Encounter: Payer: Self-pay | Admitting: Gastroenterology

## 2022-05-05 ENCOUNTER — Emergency Department (HOSPITAL_COMMUNITY)
Admission: EM | Admit: 2022-05-05 | Discharge: 2022-05-05 | Discharge status: Against Medical Advice | Payer: Medicare Other

## 2022-05-05 ENCOUNTER — Other Ambulatory Visit: Payer: Self-pay

## 2022-05-05 NOTE — ED Notes (Signed)
Pt called for triage, no answer

## 2022-05-05 NOTE — ED Notes (Signed)
X1 no response for triage vitals

## 2022-05-06 ENCOUNTER — Emergency Department (HOSPITAL_COMMUNITY): Payer: Medicare Other

## 2022-05-06 ENCOUNTER — Encounter (HOSPITAL_COMMUNITY): Payer: Self-pay | Admitting: Emergency Medicine

## 2022-05-06 ENCOUNTER — Encounter (HOSPITAL_COMMUNITY): Payer: Self-pay

## 2022-05-06 ENCOUNTER — Ambulatory Visit (HOSPITAL_COMMUNITY)
Admission: EM | Admit: 2022-05-06 | Discharge: 2022-05-06 | Disposition: A | Discharge status: Home / Self Care | Payer: Medicare Other | Attending: Internal Medicine | Admitting: Internal Medicine

## 2022-05-06 ENCOUNTER — Emergency Department (HOSPITAL_COMMUNITY)
Admission: EM | Admit: 2022-05-06 | Discharge: 2022-05-07 | Disposition: A | Discharge status: Home / Self Care | Payer: Medicare Other | Attending: Emergency Medicine | Admitting: Emergency Medicine

## 2022-05-06 DIAGNOSIS — R1013 Epigastric pain: Secondary | ICD-10-CM | POA: Diagnosis present

## 2022-05-06 DIAGNOSIS — Z7951 Long term (current) use of inhaled steroids: Secondary | ICD-10-CM | POA: Diagnosis not present

## 2022-05-06 DIAGNOSIS — Z85118 Personal history of other malignant neoplasm of bronchus and lung: Secondary | ICD-10-CM | POA: Insufficient documentation

## 2022-05-06 DIAGNOSIS — R531 Weakness: Secondary | ICD-10-CM | POA: Diagnosis not present

## 2022-05-06 DIAGNOSIS — R112 Nausea with vomiting, unspecified: Secondary | ICD-10-CM

## 2022-05-06 DIAGNOSIS — Z79899 Other long term (current) drug therapy: Secondary | ICD-10-CM | POA: Insufficient documentation

## 2022-05-06 DIAGNOSIS — R197 Diarrhea, unspecified: Secondary | ICD-10-CM

## 2022-05-06 DIAGNOSIS — R1012 Left upper quadrant pain: Secondary | ICD-10-CM

## 2022-05-06 DIAGNOSIS — J449 Chronic obstructive pulmonary disease, unspecified: Secondary | ICD-10-CM | POA: Diagnosis not present

## 2022-05-06 DIAGNOSIS — I5032 Chronic diastolic (congestive) heart failure: Secondary | ICD-10-CM | POA: Insufficient documentation

## 2022-05-06 DIAGNOSIS — R0602 Shortness of breath: Secondary | ICD-10-CM | POA: Diagnosis not present

## 2022-05-06 DIAGNOSIS — Z7982 Long term (current) use of aspirin: Secondary | ICD-10-CM | POA: Insufficient documentation

## 2022-05-06 DIAGNOSIS — R062 Wheezing: Secondary | ICD-10-CM | POA: Diagnosis not present

## 2022-05-06 DIAGNOSIS — R000 Tachycardia, unspecified: Secondary | ICD-10-CM | POA: Insufficient documentation

## 2022-05-06 DIAGNOSIS — I110 Hypertensive heart disease with heart failure: Secondary | ICD-10-CM | POA: Insufficient documentation

## 2022-05-06 LAB — COMPREHENSIVE METABOLIC PANEL
ALT: 12 U/L (ref 0–44)
AST: 21 U/L (ref 15–41)
Albumin: 4.2 g/dL (ref 3.5–5.0)
Alkaline Phosphatase: 76 U/L (ref 38–126)
Anion gap: 16 — ABNORMAL HIGH (ref 5–15)
BUN: 40 mg/dL — ABNORMAL HIGH (ref 8–23)
CO2: 26 mmol/L (ref 22–32)
Calcium: 11 mg/dL — ABNORMAL HIGH (ref 8.9–10.3)
Chloride: 92 mmol/L — ABNORMAL LOW (ref 98–111)
Creatinine, Ser: 1.8 mg/dL — ABNORMAL HIGH (ref 0.44–1.00)
GFR, Estimated: 29 mL/min — ABNORMAL LOW (ref >60)
Glucose, Bld: 123 mg/dL — ABNORMAL HIGH (ref 70–99)
Potassium: 3.2 mmol/L — ABNORMAL LOW (ref 3.5–5.1)
Sodium: 134 mmol/L — ABNORMAL LOW (ref 135–145)
Total Bilirubin: 0.9 mg/dL (ref 0.3–1.2)
Total Protein: 7.7 g/dL (ref 6.5–8.1)

## 2022-05-06 LAB — CBC
HCT: 45.9 % (ref 36.0–46.0)
Hemoglobin: 15.9 g/dL — ABNORMAL HIGH (ref 12.0–15.0)
MCH: 30.2 pg (ref 26.0–34.0)
MCHC: 34.6 g/dL (ref 30.0–36.0)
MCV: 87.3 fL (ref 80.0–100.0)
Platelets: 372 10*3/uL (ref 150–400)
RBC: 5.26 MIL/uL — ABNORMAL HIGH (ref 3.87–5.11)
RDW: 12.3 % (ref 11.5–15.5)
WBC: 12.1 10*3/uL — ABNORMAL HIGH (ref 4.0–10.5)
nRBC: 0 % (ref 0.0–0.2)

## 2022-05-06 LAB — LIPASE, BLOOD: Lipase: 33 U/L (ref 11–51)

## 2022-05-06 LAB — CBG MONITORING, ED: Glucose-Capillary: 115 mg/dL — ABNORMAL HIGH (ref 70–99)

## 2022-05-06 MED ORDER — ALBUTEROL SULFATE (2.5 MG/3ML) 0.083% IN NEBU
2.5000 mg | INHALATION_SOLUTION | Freq: Once | RESPIRATORY_TRACT | Status: AC
  Administered 2022-05-06: 2.5 mg via RESPIRATORY_TRACT

## 2022-05-06 MED ORDER — ONDANSETRON HCL 4 MG/2ML IJ SOLN
4.0000 mg | Freq: Once | INTRAMUSCULAR | Status: AC
  Administered 2022-05-06: 4 mg via INTRAVENOUS
  Filled 2022-05-06: qty 2

## 2022-05-06 MED ORDER — SODIUM CHLORIDE 0.9 % IV BOLUS
1000.0000 mL | Freq: Once | INTRAVENOUS | Status: AC
  Administered 2022-05-06: 1000 mL via INTRAVENOUS

## 2022-05-06 MED ORDER — ALBUTEROL SULFATE (2.5 MG/3ML) 0.083% IN NEBU
INHALATION_SOLUTION | RESPIRATORY_TRACT | Status: AC
  Filled 2022-05-06: qty 3

## 2022-05-06 NOTE — ED Notes (Signed)
[facility] RN made aware of patient's arrival via [facility].

## 2022-05-06 NOTE — ED Triage Notes (Signed)
Pt BIB [facility] from [facility] for eval of abd pain, N/V. [facility] reports pt was here yesterday in lobby, left d/t wait time. Went to UC this AM d/t still feeling poorly. Reports fatigue. Did have endo done several months ago.

## 2022-05-06 NOTE — ED Provider Notes (Incomplete)
[facility] EMERGENCY DEPARTMENT Provider Note   CSN: [id number] Arrival date & time: 05/06/22  1304     History {Add pertinent medical, surgical, social history, OB history to HPI:1} Chief Complaint  Patient presents with   Abdominal Pain    Katie [name] is a 75 y.o. female.  HPI     This is a 76 year old female who presents with nausea, vomiting.  Patient reports 3 to 4-day history of ongoing nausea and vomiting.  She states she is unable to keep anything down.  She had a similar episode in May and she followed up with gastroenterology.  She had an endoscopy which showed grade a esophagitis and gastritis.  She is on Protonix and Phenergan.  She states that Phenergan is not helping.  She reports crampy epigastric pain.  Last bowel movement was 3 days ago.  She reports generalized weakness.  Denies any recent fevers, cough, shortness of breath.  Was seen and evaluated at urgent care.  At that time she was referred to the emergency room.  She did receive an albuterol treatment while at urgent care.  Chart reviewed.  Endoscopy results as above.  She is on Protonix and Phenergan.  Home Medications Prior to Admission medications   Medication Sig Start Date End Date Taking? Authorizing Provider  acetaminophen (TYLENOL) 500 MG tablet Take 1,000 mg by mouth every 8 (eight) hours as needed for mild pain.    [provider]  albuterol (VENTOLIN HFA) 108 (90 Base) MCG/ACT inhaler Inhale 2 puffs into the lungs every 6 (six) hours as needed for wheezing or shortness of breath. 02/08/20   Brunetta Jeans, PA-C  aspirin EC 81 MG tablet Take 1 tablet (81 mg total) by mouth daily. 09/29/18   Bonnielee Haff, MD  atorvastatin (LIPITOR) 40 MG tablet Take 40 mg by mouth at bedtime.    [provider]  famotidine (PEPCID) 20 MG tablet Take 1 tablet (20 mg total) by mouth daily. 01/22/22   Godfrey Pick, MD  loperamide (IMODIUM) 2 MG capsule Take 1 capsule (2 mg total)  by mouth every 12 (twelve) hours as needed for diarrhea or loose stools. 01/17/22   Kayleen Memos, DO  losartan (COZAAR) 50 MG tablet Take 50 mg by mouth daily.    [provider]  metoCLOPramide (REGLAN) 10 MG tablet Take 1 tablet (10 mg total) by mouth every 8 (eight) hours as needed for up to 20 doses for nausea. For 1 week 03/14/22   Jackquline Denmark, MD  metoprolol tartrate (LOPRESSOR) 25 MG tablet Take 1 tablet (25 mg total) by mouth 2 (two) times daily. 04/23/21   Kennyth Arnold, FNP  Misc. Devices (PULSE OXIMETER FOR FINGER) MISC 1 Device by Does not apply route as needed. 03/11/19   Brunetta Jeans, PA-C  ondansetron (ZOFRAN) 4 MG tablet Take 1 tablet (4 mg total) by mouth daily. 03/13/22   Aloysuis Ribaudo, Barbette Hair, MD  oxyCODONE-acetaminophen (PERCOCET/ROXICET) 5-325 MG tablet Take 1 tablet by mouth every 6 (six) hours as needed for severe pain. 02/13/22   Sponseller, Eugene Garnet R, PA-C  pantoprazole (PROTONIX) 40 MG tablet Take 1 tablet (40 mg total) by mouth daily. 03/14/22   Jackquline Denmark, MD  polyethylene glycol (MIRALAX) 17 g packet Take 17 g by mouth daily. 01/17/22   Kayleen Memos, DO  PROLIA 60 MG/ML SOSY injection Inject 60 mg into the skin every 6 (six) months. 05/16/21   [provider]  promethazine (PHENERGAN) 12.5 MG tablet  Take 1 tablet (12.5 mg total) by mouth every 6 (six) hours as needed for nausea or vomiting. 03/14/22   Jackquline Denmark, MD  simethicone (GAS-X) 80 MG chewable tablet Chew 1 tablet (80 mg total) by mouth 4 (four) times daily as needed for up to 5 days for flatulence. 01/17/22 03/29/22  Kayleen Memos, DO  TRELEGY ELLIPTA 200-62.5-25 MCG/INH AEPB Inhale 1 puff into the lungs daily. 07/17/21   [provider]  valACYclovir (VALTREX) 1000 MG tablet Take 1,000 mg by mouth 2 (two) times daily. 01/30/22   [provider]      Allergies    Patient has no known allergies.    Review of Systems   Review of Systems  Constitutional:  Negative for  fever.  Gastrointestinal:  Positive for abdominal pain, constipation, nausea and vomiting.  All other systems reviewed and are negative.   Physical Exam Updated Vital Signs BP 108/89   Pulse 100   Temp 98 F (36.7 C)   Resp 18   Ht 1.549 m ('5\' 1"'$ )   Wt 58 kg   SpO2 99%   BMI 24.16 kg/m  Physical Exam Vitals and nursing note reviewed.  Constitutional:      Appearance: She is well-developed. She is not ill-appearing.  HENT:     Head: Normocephalic and atraumatic.     Mouth/Throat:     Mouth: Mucous membranes are dry.  Eyes:     Pupils: Pupils are equal, round, and reactive to light.  Cardiovascular:     Rate and Rhythm: Regular rhythm. Tachycardia present.     Heart sounds: Normal heart sounds.  Pulmonary:     Effort: Pulmonary effort is normal. No respiratory distress.     Breath sounds: No wheezing.  Abdominal:     General: Bowel sounds are normal.     Palpations: Abdomen is soft.     Tenderness: There is abdominal tenderness.     Comments: Mild epigastric tenderness to palpation, no rebound or guarding  Musculoskeletal:     Cervical back: Neck supple.  Skin:    General: Skin is warm and dry.  Neurological:     Mental Status: She is alert and oriented to person, place, and time.  Psychiatric:        Mood and Affect: Mood normal.     ED Results / Procedures / Treatments   Labs (all labs ordered are listed, but only abnormal results are displayed) Labs Reviewed  COMPREHENSIVE METABOLIC PANEL - Abnormal; Notable for the following components:      Result Value   Sodium 134 (*)    Potassium 3.2 (*)    Chloride 92 (*)    Glucose, Bld 123 (*)    BUN 40 (*)    Creatinine, Ser 1.80 (*)    Calcium 11.0 (*)    GFR, Estimated 29 (*)    Anion gap 16 (*)    All other components within normal limits  CBC - Abnormal; Notable for the following components:   WBC 12.1 (*)    RBC 5.26 (*)    Hemoglobin 15.9 (*)    All other components within normal limits  LIPASE,  BLOOD  URINALYSIS, ROUTINE W REFLEX MICROSCOPIC    EKG None  Radiology CT ABDOMEN PELVIS WO CONTRAST  Result Date: 05/06/2022 CLINICAL DATA:  Abdominal pain. Abdominal pain with nausea/vomiting/diarrhea since Saturday. EXAM: CT ABDOMEN AND PELVIS WITHOUT CONTRAST TECHNIQUE: Multidetector CT imaging of the abdomen and pelvis was performed following the standard protocol  without IV contrast. RADIATION DOSE REDUCTION: This exam was performed according to the departmental dose-optimization program which includes automated exposure control, adjustment of the mA and/or kV according to patient size and/or use of iterative reconstruction technique. COMPARISON:  CT examination dated Mar 13, 2022 FINDINGS: Lower chest: Calcified granuloma in the right lung base. Hepatobiliary: Multiple hepatic cysts, unchanged. Status post cholecystectomy. No biliary ductal dilatation. Pancreas: Unremarkable. No pancreatic ductal dilatation or surrounding inflammatory changes. Spleen: Normal in size without focal abnormality. Adrenals/Urinary Tract: Status post right nephrectomy. Right adrenal is not seen. Left adrenal is unremarkable. Left kidney is unremarkable without evidence of hydronephrosis or ureteral calculus. Urinary bladder is unremarkable. Stomach/Bowel: Stomach is within normal limits. Appendix appears normal. No evidence of bowel wall thickening, distention, or inflammatory changes. Vascular/Lymphatic: Atherosclerotic disease of abdominal aorta. Prior endovascular repair for aortic aneurysm with aorto bi iliac graft. Evaluation of graft is limited on this noncontrast enhanced examination. Reproductive: Uterus and bilateral adnexa are unremarkable. Other: No abdominal wall hernia or abnormality. No abdominopelvic ascites. Musculoskeletal: Chronic compression deformity of L3 vertebral body, unchanged. Degenerate disc disease of the lumbar spine. No acute osseous abnormality. IMPRESSION: 1. Bowel loops are normal in  caliber. No evidence of colitis or diverticulitis. Normal appendix. 2.  No CT evidence of acute abdominal/pelvic process. 3. Chronic findings including right nephrectomy, cholecystectomy, aorto bi iliac stent graft are unchanged. 4. Chronic compression deformity of the L3 vertebral body and degenerate disc disease of the lumbar spine. Electronically Signed   By: Keane Police D.O.   On: 05/06/2022 17:57    Procedures Procedures  {Document cardiac monitor, telemetry assessment procedure when appropriate:1}  Medications Ordered in ED Medications  sodium chloride 0.9 % bolus 1,000 mL (has no administration in time range)  ondansetron (ZOFRAN) injection 4 mg (has no administration in time range)    ED Course/ Medical Decision Making/ A&P                           Medical Decision Making Amount and/or Complexity of Data Reviewed Labs: ordered.  Risk Prescription drug management.   ***  {Document critical care time when appropriate:1} {Document review of labs and clinical decision tools ie heart score, Chads2Vasc2 etc:1}  {Document your independent review of radiology images, and any outside records:1} {Document your discussion with family members, caretakers, and with consultants:1} {Document social determinants of health affecting pt's care:1} {Document your decision making why or why not admission, treatments were needed:1} Final Clinical Impression(s) / ED Diagnoses Final diagnoses:  None    Rx / DC Orders ED Discharge Orders     None

## 2022-05-06 NOTE — ED Notes (Signed)
Patient is being discharged from the Urgent Care and sent to the Emergency Department via [facility] . Per Sharyn Lull NP, patient is in need of higher level of care due to extreme weakness, exertional SOB, and persistent n/v/d x 4 days. Patient is aware and verbalizes understanding of plan of care.  Vitals:   05/06/22 1155  BP: 120/87  Pulse: 94  Resp: 17  Temp: 98 F (36.7 C)  SpO2: 95%  t

## 2022-05-06 NOTE — ED Provider Triage Note (Signed)
Emergency Medicine Provider Triage Evaluation Note  Katie [name] , a 76 y.o. female  was evaluated in triage.  Pt complains of abdominal pain, nausea, vomiting, and diarrhea. She states that same has been ongoing since Saturday and she has been unable to tolerate po since then. Also endorses periumbilical abdominal pain. No hx of abdominal surgeries  Review of Systems  Positive:  Negative:   Physical Exam  BP 100/73 (BP Location: Right Arm)   Pulse 96   Temp 98.2 F (36.8 C) (Oral)   Resp 16   Ht '5\' 1"'$  (1.549 m)   Wt 58 kg   SpO2 98%   BMI 24.16 kg/m  Gen:   Awake, no distress   Resp:  Normal effort  MSK:   Moves extremities without difficulty  Other:  Periumbilical abdominal tenderness  Medical Decision Making  Medically screening exam initiated at 3:12 PM.  Appropriate orders placed.  Katie [name] was informed that the remainder of the evaluation will be completed by another provider, this initial triage assessment does not replace that evaluation, and the importance of remaining in the ED until their evaluation is complete.     Bud Face, PA-C 05/06/22 1514

## 2022-05-06 NOTE — ED Triage Notes (Signed)
Pt reports abd pains with n/v/d since Saturday. Went to ED last night but left due to long wait. Called ED this morning but still having 10 hour waits so came here in stead. Reports very fatigue. Reports had these symptoms several months ago went to Gi and had endoscopy done. Reports no one could find anything wrong.  When patient walking from lobby was weak so put in rolling chair to take to room.

## 2022-05-06 NOTE — Discharge Instructions (Signed)
Please go to the nearest emergency department for further evaluation and management of your symptoms.

## 2022-05-06 NOTE — ED Provider Notes (Signed)
[facility]    CSN: [id number] Arrival date & time: 05/06/22  1121      History   Chief Complaint Chief Complaint  Patient presents with   Abdominal Pain   Emesis   Diarrhea    HPI Katie [name] is a 76 y.o. female.   Patient presents to urgent care for evaluation of persistent nausea and vomiting over the last 4 days as well as 1 episode of diarrhea this morning.  Patient states that her emesis appears to be "mucus" in consistency.  She reports that she has lost a lot of weight over the last few days and has had a significantly decreased appetite.  She is reporting significant left upper quadrant abdominal pain and epigastric pain that she currently rates it a 9 on a scale of 0-10.  She has not attempted use of any medications prior to arrival urgent care for her pain.  She has a significant medical history of AAA endovascular repair in March 2023, GERD, COPD, and CHF.  Last normal bowel movement was 3 days ago and patient states that she was constipated at that time.  Patient was unable to walk from the waiting room back to the exam room due to shortness of breath, fatigue, and weakness.  Patient was placed in a chair and wheeled back to the exam room due to weakness.  Patient denies recent falls, passing out, dizziness, and lightheadedness.  She does report increasing fatigue, cold chills, and feels as though her skin appears pale.  Patient denies blood to the stools and emesis.  She denies urinary symptoms like urinary frequency, urgency, dysuria, and hesitancy.  She denies fever at home but reports cold chills intermittently.  She has not had to use her albuterol inhaler in "a while" and is reporting some shortness of breath at this time but does not feel as though it is bad enough to use an albuterol nebulizer treatment.  She was recently seen by gastroenterology a couple of months ago where she underwent an endoscopy procedure.  She believes that an ulcer may have been found  in her stomach during the endoscopy procedure.  She has attempted use of nausea medicine that she was given at a previous visit but does not know the name of this medication.  Last dose of nausea medicine was yesterday.  She has not been able to keep any food or water down since Saturday, May 03, 2022.  She attempted to eat some eggs this morning and states she had 1 cup of water without vomiting.  No known sick contacts.  Denies alcohol use.  She is a current smoker and denies use of other drugs/illicit drugs.  Patient attempted to go to the emergency department yesterday and was in the waiting room for 2 hours and left without being seen or triage.    Abdominal Pain Associated symptoms: diarrhea and vomiting   Emesis Associated symptoms: abdominal pain and diarrhea   Diarrhea Associated symptoms: abdominal pain and vomiting     Past Medical History:  Diagnosis Date   AAA (abdominal aortic aneurysm) ([place])    Arthritis    Cholelithiasis 2011   s/p cholescystectomy    Chronic diastolic congestive heart failure (HCC)    COPD (chronic obstructive pulmonary disease) (HCC)    Emphysema    GERD (gastroesophageal reflux disease)    History of heartburn    History of renal cell carcinoma    Hypercholesteremia    Hypertension    Hypertension  Mitral regurgitation    Pneumonia 1980's   Renal cell carcinoma 01/2010   s/p right radical nephrectomy 01/2010,  followed by [facility]   S/P minimally invasive mitral valve repair 04/20/2019   Complex valvuloplasty including triangular resection of flail segment of posterior leaflet, artificial Gore-tex neochord placement x6 and 30 mm Sorin Memo 4D ring annuloplasty via right mini thoracotomy approach   Shortness of breath    Tuberculosis    Tests positive for PPD. dad had h/o TB.    Patient Active Problem List   Diagnosis Date Noted   Ileus ([facility]) 01/14/2022   Abnormal urinalysis 01/11/2022   Abnormal echocardiogram 01/11/2022    Gastroenteritis 01/10/2022   Thyroid nodule 01/10/2022   Hepatic cyst 01/10/2022   Abdominal aortic aneurysm (AAA) 3.0 cm to 5.0 cm in diameter in female [facility]) 01/09/2022   Intractable nausea and vomiting 07/23/2021   Generalized abdominal pain 07/23/2021   QT prolongation 07/23/2021   AAA (abdominal aortic aneurysm) without rupture ([place]) 05/04/2020   Atrial fibrillation ([place]) 04/26/2019   S/P minimally invasive mitral valve repair 04/20/2019   Chronic diastolic congestive heart failure ([place])    Pneumonia 04/04/2019   Smokers' cough ([place]) 03/13/2019   COPD with acute exacerbation ([place]) 03/13/2019   Fx metatarsal 03/07/2019   CKD stage G3a/A1, GFR 45-59 and albumin creatinine ratio <30 mg/g (HCC) 09/29/2018   Osteoporosis 12/31/2016   Arthralgia of both hands 11/11/2016   Chest pain 04/17/2016   Hyperglycemia 08/03/2015   Esophageal reflux    Depression with anxiety    AKI (acute kidney injury) ([place])    Tobacco abuse disorder 06/27/2014   Insomnia 07/15/2013   Severe mitral regurgitation 09/29/2011   COPD 09/26/2011   Essential hypertension 09/26/2011   HLD (hyperlipidemia) 09/26/2011   S/P cholecystectomy 16/07/9603   Holosystolic murmur 54/06/8118    Past Surgical History:  Procedure Laterality Date   ABDOMINAL AORTIC ENDOVASCULAR STENT GRAFT N/A 01/10/2022   Procedure: ABDOMINAL AORTIC ENDOVASCULAR STENT GRAFT;  Surgeon: Marty Heck, MD;  Location: [facility];  Service: Vascular;  Laterality: N/A;   APPENDECTOMY  1970's   BUBBLE STUDY  03/18/2019   Procedure: BUBBLE STUDY;  Surgeon: Fay Records, MD;  Location: [facility];  Service: Cardiovascular;;   CARDIAC CATHETERIZATION  09/2011   CHOLECYSTECTOMY  01/2010   DIAGNOSTIC LAPAROSCOPY     KIDNEY SURGERY     LEFT AND RIGHT HEART CATHETERIZATION WITH CORONARY ANGIOGRAM N/A 09/30/2011   Procedure: LEFT AND RIGHT HEART CATHETERIZATION WITH CORONARY ANGIOGRAM;  Surgeon: Candee Furbish, MD;  Location: [facility] CATH LAB;  Service:  Cardiovascular;  Laterality: N/A;   MITRAL VALVE REPAIR Right 04/20/2019   Procedure: MINIMALLY INVASIVE MITRAL VALVE REPAIR (MVR) USING MEMO 4D SIZE 30;  Surgeon: Rexene Alberts, MD;  Location: [facility];  Service: Open Heart Surgery;  Laterality: Right;   MULTIPLE EXTRACTIONS WITH ALVEOLOPLASTY  10/06/2011   Procedure: MULTIPLE EXTRACION WITH ALVEOLOPLASTY;  Surgeon: Lenn Cal, DDS;  Location: [facility];  Service: Oral Surgery;  Laterality: N/A;  Multiple extraction of tooth #'s 1, 6, 8, 10, 11, 22, 23, 26, 27, 28, 29 with alveoloplasty and Upper right buccal exostoses reductions.   NEPHRECTOMY RADICAL  01/2010   right    ORIF WRIST FRACTURE Right 02/17/2022   Procedure: OPEN REDUCTION INTERNAL FIXATION (ORIF) WRIST FRACTURE;  Surgeon: Iran Planas, MD;  Location: [facility];  Service: Orthopedics;  Laterality: Right;  with IV sedation   OVARIAN CYST REMOVAL  1970's   "went  through belly button"   RIGHT/LEFT HEART CATH AND CORONARY ANGIOGRAPHY N/A 03/22/2019   Procedure: RIGHT/LEFT HEART CATH AND CORONARY ANGIOGRAPHY;  Surgeon: [name], MD;  Location: [facility] CV LAB;  Service: Cardiovascular;  Laterality: N/A;   TEE WITHOUT CARDIOVERSION  10/01/2011   Procedure: TRANSESOPHAGEAL ECHOCARDIOGRAM (TEE);  Surgeon: [name];  Location: [facility];  Service: Cardiovascular;  Laterality: N/A;   TEE WITHOUT CARDIOVERSION N/A 03/18/2019   Procedure: TRANSESOPHAGEAL ECHOCARDIOGRAM (TEE);  Surgeon: Fay Records, MD;  Location: [place];  Service: Cardiovascular;  Laterality: N/A;   TEE WITHOUT CARDIOVERSION N/A 04/20/2019   Procedure: TRANSESOPHAGEAL ECHOCARDIOGRAM (TEE);  Surgeon: Rexene Alberts, MD;  Location: [facility] [place];  Service: Open Heart Surgery;  Laterality: N/A;   TUBAL LIGATION  1970's   US ECHOCARDIOGRAPHY  09/2011    OB History   No obstetric history on file.      Home Medications    Prior to Admission medications   Medication Sig Start Date End Date Taking?  Authorizing Provider  acetaminophen (TYLENOL) 500 MG tablet Take 1,000 mg by mouth every 8 (eight) hours as needed for mild pain.    [provider]  albuterol (VENTOLIN HFA) 108 (90 Base) MCG/ACT inhaler Inhale 2 puffs into the lungs every 6 (six) hours as needed for wheezing or shortness of breath. 02/08/20   Brunetta Jeans, PA-C  aspirin EC 81 MG tablet Take 1 tablet (81 mg total) by mouth daily. 09/29/18   Bonnielee Haff, MD  atorvastatin (LIPITOR) 40 MG tablet Take 40 mg by mouth at bedtime.    [provider]  famotidine (PEPCID) 20 MG tablet Take 1 tablet (20 mg total) by mouth daily. 01/22/22   Godfrey Pick, MD  loperamide (IMODIUM) 2 MG capsule Take 1 capsule (2 mg total) by mouth every 12 (twelve) hours as needed for diarrhea or loose stools. 01/17/22   Kayleen Memos, DO  losartan (COZAAR) 50 MG tablet Take 50 mg by mouth daily.    [provider]  metoCLOPramide (REGLAN) 10 MG tablet Take 1 tablet (10 mg total) by mouth every 8 (eight) hours as needed for up to 20 doses for nausea. For 1 week 03/14/22   Jackquline Denmark, MD  metoprolol tartrate (LOPRESSOR) 25 MG tablet Take 1 tablet (25 mg total) by mouth 2 (two) times daily. 04/23/21   Kennyth Arnold, FNP  Misc. Devices (PULSE OXIMETER FOR FINGER) MISC 1 Device by Does not apply route as needed. 03/11/19   Brunetta Jeans, PA-C  ondansetron (ZOFRAN) 4 MG tablet Take 1 tablet (4 mg total) by mouth daily. 03/13/22   Horton, Barbette Hair, MD  oxyCODONE-acetaminophen (PERCOCET/ROXICET) 5-325 MG tablet Take 1 tablet by mouth every 6 (six) hours as needed for severe pain. 02/13/22   Sponseller, Eugene Garnet R, PA-C  pantoprazole (PROTONIX) 40 MG tablet Take 1 tablet (40 mg total) by mouth daily. 03/14/22   Jackquline Denmark, MD  polyethylene glycol (MIRALAX) 17 g packet Take 17 g by mouth daily. 01/17/22   Kayleen Memos, DO  PROLIA 60 MG/ML SOSY injection Inject 60 mg into the skin every 6 (six) months. 05/16/21   [provider]  promethazine (PHENERGAN) 12.5 MG tablet Take 1 tablet (12.5 mg total) by mouth every 6 (six) hours as needed for nausea or vomiting. 03/14/22   Jackquline Denmark, MD  simethicone (GAS-X) 80 MG chewable tablet Chew 1 tablet (80 mg total) by mouth 4 (four) times daily as needed for  up to 5 days for flatulence. 01/17/22 03/29/22  Kayleen Memos, DO  TRELEGY ELLIPTA 200-62.5-25 MCG/INH AEPB Inhale 1 puff into the lungs daily. 07/17/21   [provider]  valACYclovir (VALTREX) 1000 MG tablet Take 1,000 mg by mouth 2 (two) times daily. 01/30/22   [provider]    Family History Family History  Problem Relation Age of Onset   COPD Mother        DECEASED/SMOKED   Diabetes Brother    Diabetes Brother    Colon cancer Neg Hx    Rectal cancer Neg Hx    Esophageal cancer Neg Hx    Stomach cancer Neg Hx     Social History Social History   Tobacco Use   Smoking status: Every Day    Packs/day: 0.50    Years: 53.00    Total pack years: 26.50    Types: Cigarettes    Last attempt to quit: 03/18/2019    Years since quitting: 3.1    Passive exposure: Current (Son who lives with her Smokes)   Smokeless tobacco: Never  Vaping Use   Vaping Use: Never used  Substance Use Topics   Alcohol use: Not Currently   Drug use: Not Currently    Types: Cocaine    Comment: pos drug test in epic on 02/07/22     Allergies   Patient has no known allergies.   Review of Systems Review of Systems  Gastrointestinal:  Positive for abdominal pain, diarrhea and vomiting.  Per HPI   Physical Exam Triage Vital Signs ED Triage Vitals  Enc Vitals Group     BP 05/06/22 1155 120/87     Pulse Rate 05/06/22 1155 94     Resp 05/06/22 1155 17     Temp 05/06/22 1155 98 F (36.7 C)     Temp Source 05/06/22 1155 Oral     SpO2 05/06/22 1155 95 %     Weight --      Height --      Head Circumference --      Peak Flow --      Pain Score 05/06/22 1154 9     Pain Loc --      Pain Edu? --      Excl.  in [place]? --    No data found.  Updated Vital Signs BP 120/87 (BP Location: Right Arm)   Pulse 94   Temp 98 F (36.7 C) (Oral)   Resp 17   SpO2 95%   Visual Acuity Right Eye Distance:   Left Eye Distance:   Bilateral Distance:    Right Eye Near:   Left Eye Near:    Bilateral Near:     Physical Exam Vitals and nursing note reviewed.  Constitutional:      Appearance: Normal appearance. She is ill-appearing. She is not toxic-appearing.  HENT:     Head: Normocephalic and atraumatic.     Right Ear: Hearing and external ear normal.     Left Ear: Hearing and external ear normal.     Nose: Nose normal.     Mouth/Throat:     Lips: Pink.     Mouth: Mucous membranes are dry.     Pharynx: No posterior oropharyngeal erythema.  Eyes:     General: Lids are normal. Vision grossly intact. Gaze aligned appropriately.     Extraocular Movements: Extraocular movements intact.     Conjunctiva/sclera: Conjunctivae normal.     Pupils: Pupils are equal, round, and reactive  to light.  Cardiovascular:     Rate and Rhythm: Normal rate and regular rhythm.     Heart sounds: Normal heart sounds, S1 normal and S2 normal.  Pulmonary:     Effort: Pulmonary effort is normal. No respiratory distress.     Breath sounds: Normal air entry. Wheezing and rhonchi present.  Abdominal:     General: Abdomen is flat. Bowel sounds are normal.     Palpations: Abdomen is soft.     Tenderness: There is abdominal tenderness in the epigastric area and left upper quadrant. There is guarding. There is no right CVA tenderness or left CVA tenderness. Negative signs include McBurney's sign.     Comments: There is guarding to the epigastric area of the stomach and the left upper quadrant with palpation of the abdomen.  Musculoskeletal:     Cervical back: Neck supple.  Skin:    General: Skin is warm and dry.     Capillary Refill: Capillary refill takes less than 2 seconds.     Coloration: Skin is pale.     Findings: No  rash.  Neurological:     General: No focal deficit present.     Mental Status: She is alert and oriented to person, place, and time. Mental status is at baseline.     Cranial Nerves: No dysarthria or facial asymmetry.     Motor: Weakness present.     Comments: 4/5 grip strength to bilateral upper extremities.  5/5 strength to bilateral lower extremities against resistance with abduction and abduction.  Psychiatric:        Mood and Affect: Mood normal.        Speech: Speech normal.        Behavior: Behavior normal.        Thought Content: Thought content normal.        Judgment: Judgment normal.      UC Treatments / Results  Labs (all labs ordered are listed, but only abnormal results are displayed) Labs Reviewed  CBG MONITORING, ED - Abnormal; Notable for the following components:      Result Value   Glucose-Capillary 115 (*)    All other components within normal limits    EKG   Radiology No results found.  Procedures Procedures (including critical care time)  Medications Ordered in UC Medications  albuterol (PROVENTIL) (2.5 MG/3ML) 0.083% nebulizer solution 2.5 mg (2.5 mg Nebulization Given 05/06/22 1255)    Initial Impression / Assessment and Plan / UC Course  I have reviewed the triage vital signs and the nursing notes.  Pertinent labs & imaging results that were available during my care of the patient were reviewed by me and considered in my medical decision making (see chart for details).  1.  Weakness Patient weak with ambulation and reporting mild shortness of breath.  No focal neurologic deficit identified at this time.  Wheezing and rhonchi heard to auscultation of lungs initially.  Patient given albuterol breathing treatment in clinic.  She is ill-appearing and appears pale.  There is concern for possible symptomatic anemia and intra-abdominal abnormality due to significant history of AAA endovascular stenting and significant epigastric and left upper quadrant  pain with palpation.  Vital signs are hemodynamically stable at this time.  CBG is 115 in clinic.  Patient would benefit from urgent lab wok and evaluation in the emergency department due to significant and persistent nausea, vomiting, and weakness.  Discussed physical exam findings, available lab findings, and recommendations with patient who verbalizes hesitancy  to go to the emergency department due to long wait times.  Patient voices concern and states that she also feels as though there may be something going on inside of her abdomen that needs to be evaluated further with resources that are not available at urgent care.  Patient does not have anyone urgent care with her who could drive her over to the emergency department at this time.  Since patient is barely able to walk from the waiting room to the exam room, I advised against patient walking to the emergency department for evaluation by herself due to safety concerns and risk for falling due to weakness. Patient agrees to [facility] transport to the emergency department for further evaluation and management of her symptoms.  Patient discharged from urgent care with [facility] in stable condition after albuterol breathing treatment.   Final Clinical Impressions(s) / UC Diagnoses   Final diagnoses:  Nausea vomiting and diarrhea  Left upper quadrant abdominal pain  Weakness  Shortness of breath     Discharge Instructions      Please go to the nearest emergency department for further evaluation and management of your symptoms.      ED Prescriptions   None    PDMP not reviewed this encounter.   Talbot Grumbling, [place] 05/06/22 1326

## 2022-05-07 LAB — URINALYSIS, ROUTINE W REFLEX MICROSCOPIC
Bilirubin Urine: NEGATIVE
Glucose, UA: NEGATIVE mg/dL
Hgb urine dipstick: NEGATIVE
Ketones, ur: NEGATIVE mg/dL
Nitrite: NEGATIVE
Protein, ur: 30 mg/dL — AB
Specific Gravity, Urine: 1.021 (ref 1.005–1.030)
pH: 5 (ref 5.0–8.0)

## 2022-05-07 MED ORDER — ONDANSETRON HCL 4 MG/2ML IJ SOLN
4.0000 mg | Freq: Once | INTRAMUSCULAR | Status: AC
Start: 2022-05-07 — End: 2022-05-07
  Administered 2022-05-07: 4 mg via INTRAVENOUS
  Filled 2022-05-07: qty 2

## 2022-05-07 MED ORDER — SUCRALFATE 1 G PO TABS: 1.0000 g | ORAL_TABLET | Freq: Three times a day (TID) | ORAL | 0 refills | Status: DC

## 2022-05-07 MED ORDER — SUCRALFATE 1 G PO TABS
1.0000 g | ORAL_TABLET | Freq: Once | ORAL | Status: AC
  Administered 2022-05-07: 1 g via ORAL
  Filled 2022-05-07: qty 1

## 2022-05-07 MED ORDER — DICYCLOMINE HCL 10 MG PO CAPS
10.0000 mg | ORAL_CAPSULE | Freq: Once | ORAL | Status: AC
  Administered 2022-05-07: 10 mg via ORAL
  Filled 2022-05-07: qty 1

## 2022-05-07 NOTE — Discharge Instructions (Signed)
You were seen today for abdominal pain.  This may be related to your known gastritis.  Continue Protonix at home.  You may add Carafate.  Take Phenergan as needed for nausea.  Follow-up closely with your GI doctor.

## 2022-05-07 NOTE — ED Notes (Signed)
Pt provided water for PO challenge

## 2022-05-12 ENCOUNTER — Telehealth: Payer: Self-pay | Admitting: Gastroenterology

## 2022-05-13 NOTE — Telephone Encounter (Unsigned)
Left message for pt to call back  °

## 2022-05-14 NOTE — Telephone Encounter (Signed)
Pt states that she is still having vomiting, abdominal pain, weight loss: Pt stated that she went to the Hospital for this last week and was given Sucralfate which is not helping any: Pt was scheduled for an office visit with Earnie Larsson PA 05/22/2022 at 1:30 PM.  Pt made aware Pt verbalized understanding with all questions answered.

## 2022-05-22 ENCOUNTER — Ambulatory Visit: Payer: Medicare Other | Admitting: Physician Assistant

## 2022-06-18 ENCOUNTER — Ambulatory Visit: Payer: Medicare Other | Admitting: Physician Assistant

## 2022-06-19 ENCOUNTER — Encounter (HOSPITAL_COMMUNITY): Payer: Self-pay | Admitting: Emergency Medicine

## 2022-06-19 ENCOUNTER — Other Ambulatory Visit: Payer: Self-pay

## 2022-06-19 ENCOUNTER — Emergency Department (HOSPITAL_COMMUNITY): Payer: Medicare Other

## 2022-06-19 ENCOUNTER — Observation Stay (HOSPITAL_COMMUNITY)
Admission: EM | Admit: 2022-06-19 | Discharge: 2022-06-20 | Disposition: A | Discharge status: Home / Self Care | Payer: Medicare Other | Attending: Internal Medicine | Admitting: Internal Medicine

## 2022-06-19 DIAGNOSIS — R112 Nausea with vomiting, unspecified: Principal | ICD-10-CM | POA: Diagnosis present

## 2022-06-19 DIAGNOSIS — J449 Chronic obstructive pulmonary disease, unspecified: Secondary | ICD-10-CM | POA: Diagnosis not present

## 2022-06-19 DIAGNOSIS — Z79899 Other long term (current) drug therapy: Secondary | ICD-10-CM | POA: Insufficient documentation

## 2022-06-19 DIAGNOSIS — Z20822 Contact with and (suspected) exposure to covid-19: Secondary | ICD-10-CM | POA: Insufficient documentation

## 2022-06-19 DIAGNOSIS — E860 Dehydration: Secondary | ICD-10-CM | POA: Diagnosis not present

## 2022-06-19 DIAGNOSIS — I2510 Atherosclerotic heart disease of native coronary artery without angina pectoris: Secondary | ICD-10-CM | POA: Insufficient documentation

## 2022-06-19 DIAGNOSIS — N179 Acute kidney failure, unspecified: Secondary | ICD-10-CM | POA: Insufficient documentation

## 2022-06-19 DIAGNOSIS — R634 Abnormal weight loss: Secondary | ICD-10-CM

## 2022-06-19 DIAGNOSIS — R1010 Upper abdominal pain, unspecified: Secondary | ICD-10-CM

## 2022-06-19 DIAGNOSIS — I130 Hypertensive heart and chronic kidney disease with heart failure and stage 1 through stage 4 chronic kidney disease, or unspecified chronic kidney disease: Secondary | ICD-10-CM | POA: Diagnosis not present

## 2022-06-19 DIAGNOSIS — R1013 Epigastric pain: Secondary | ICD-10-CM | POA: Diagnosis present

## 2022-06-19 DIAGNOSIS — I5032 Chronic diastolic (congestive) heart failure: Secondary | ICD-10-CM | POA: Diagnosis not present

## 2022-06-19 DIAGNOSIS — F17210 Nicotine dependence, cigarettes, uncomplicated: Secondary | ICD-10-CM | POA: Diagnosis not present

## 2022-06-19 DIAGNOSIS — N189 Chronic kidney disease, unspecified: Secondary | ICD-10-CM | POA: Diagnosis not present

## 2022-06-19 DIAGNOSIS — Z7982 Long term (current) use of aspirin: Secondary | ICD-10-CM | POA: Insufficient documentation

## 2022-06-19 DIAGNOSIS — Z85528 Personal history of other malignant neoplasm of kidney: Secondary | ICD-10-CM | POA: Insufficient documentation

## 2022-06-19 HISTORY — DX: Nausea with vomiting, unspecified: R11.2

## 2022-06-19 LAB — URINALYSIS, ROUTINE W REFLEX MICROSCOPIC
Bilirubin Urine: NEGATIVE
Glucose, UA: NEGATIVE mg/dL
Hgb urine dipstick: NEGATIVE
Ketones, ur: NEGATIVE mg/dL
Nitrite: NEGATIVE
Protein, ur: 30 mg/dL — AB
Specific Gravity, Urine: 1.018 (ref 1.005–1.030)
pH: 5 (ref 5.0–8.0)

## 2022-06-19 LAB — COMPREHENSIVE METABOLIC PANEL
ALT: 13 U/L (ref 0–44)
AST: 19 U/L (ref 15–41)
Albumin: 4 g/dL (ref 3.5–5.0)
Alkaline Phosphatase: 85 U/L (ref 38–126)
Anion gap: 17 — ABNORMAL HIGH (ref 5–15)
BUN: 36 mg/dL — ABNORMAL HIGH (ref 8–23)
CO2: 23 mmol/L (ref 22–32)
Calcium: 10.6 mg/dL — ABNORMAL HIGH (ref 8.9–10.3)
Chloride: 94 mmol/L — ABNORMAL LOW (ref 98–111)
Creatinine, Ser: 2.18 mg/dL — ABNORMAL HIGH (ref 0.44–1.00)
GFR, Estimated: 23 mL/min — ABNORMAL LOW (ref >60)
Glucose, Bld: 140 mg/dL — ABNORMAL HIGH (ref 70–99)
Potassium: 3.8 mmol/L (ref 3.5–5.1)
Sodium: 134 mmol/L — ABNORMAL LOW (ref 135–145)
Total Bilirubin: 1 mg/dL (ref 0.3–1.2)
Total Protein: 7.6 g/dL (ref 6.5–8.1)

## 2022-06-19 LAB — LACTIC ACID, PLASMA
Lactic Acid, Venous: 2.7 mmol/L (ref 0.5–1.9)
Lactic Acid, Venous: 2.3 mmol/L (ref 0.5–1.9)
Lactic Acid, Venous: 1.8 mmol/L (ref 0.5–1.9)

## 2022-06-19 LAB — SARS CORONAVIRUS 2 BY RT PCR: SARS Coronavirus 2 by RT PCR: NEGATIVE

## 2022-06-19 LAB — CBC
HCT: 42.8 % (ref 36.0–46.0)
Hemoglobin: 14.9 g/dL (ref 12.0–15.0)
MCH: 30.1 pg (ref 26.0–34.0)
MCHC: 34.8 g/dL (ref 30.0–36.0)
MCV: 86.5 fL (ref 80.0–100.0)
Platelets: 384 10*3/uL (ref 150–400)
RBC: 4.95 MIL/uL (ref 3.87–5.11)
RDW: 12.5 % (ref 11.5–15.5)
WBC: 12.5 10*3/uL — ABNORMAL HIGH (ref 4.0–10.5)
nRBC: 0 % (ref 0.0–0.2)

## 2022-06-19 LAB — HEMOGLOBIN A1C
Hgb A1c MFr Bld: 5.3 % (ref 4.8–5.6)
Mean Plasma Glucose: 105.41 mg/dL

## 2022-06-19 LAB — C-REACTIVE PROTEIN: CRP: 0.7 mg/dL (ref <1.0)

## 2022-06-19 LAB — LIPASE, BLOOD: Lipase: 41 U/L (ref 11–51)

## 2022-06-19 LAB — TSH: TSH: 1.739 u[IU]/mL (ref 0.350–4.500)

## 2022-06-19 MED ORDER — FLUTICASONE FUROATE-VILANTEROL 200-25 MCG/ACT IN AEPB
1.0000 | INHALATION_SPRAY | Freq: Every day | RESPIRATORY_TRACT | Status: DC
  Administered 2022-06-20: 1 via RESPIRATORY_TRACT
  Filled 2022-06-19: qty 28

## 2022-06-19 MED ORDER — PANTOPRAZOLE SODIUM 40 MG IV SOLR
40.0000 mg | Freq: Two times a day (BID) | INTRAVENOUS | Status: DC
  Administered 2022-06-19 – 2022-06-20 (×2): 40 mg via INTRAVENOUS
  Filled 2022-06-19 (×2): qty 10

## 2022-06-19 MED ORDER — ACETAMINOPHEN 650 MG RE SUPP: 650.0000 mg | Freq: Four times a day (QID) | RECTAL | Status: DC | PRN

## 2022-06-19 MED ORDER — ALBUTEROL SULFATE (2.5 MG/3ML) 0.083% IN NEBU: 2.5000 mg | INHALATION_SOLUTION | RESPIRATORY_TRACT | Status: DC | PRN

## 2022-06-19 MED ORDER — HEPARIN SODIUM (PORCINE) 5000 UNIT/ML IJ SOLN
5000.0000 [IU] | Freq: Three times a day (TID) | INTRAMUSCULAR | Status: DC
  Administered 2022-06-19 – 2022-06-20 (×3): 5000 [IU] via SUBCUTANEOUS
  Filled 2022-06-19 (×3): qty 1

## 2022-06-19 MED ORDER — ONDANSETRON HCL 4 MG PO TABS: 4.0000 mg | ORAL_TABLET | Freq: Four times a day (QID) | ORAL | Status: DC | PRN

## 2022-06-19 MED ORDER — UMECLIDINIUM BROMIDE 62.5 MCG/ACT IN AEPB
1.0000 | INHALATION_SPRAY | Freq: Every day | RESPIRATORY_TRACT | Status: DC
  Administered 2022-06-20: 1 via RESPIRATORY_TRACT
  Filled 2022-06-19: qty 7

## 2022-06-19 MED ORDER — METOCLOPRAMIDE HCL 5 MG/ML IJ SOLN
10.0000 mg | Freq: Once | INTRAMUSCULAR | Status: AC
  Administered 2022-06-19: 10 mg via INTRAVENOUS
  Filled 2022-06-19: qty 2

## 2022-06-19 MED ORDER — LACTATED RINGERS IV SOLN: INTRAVENOUS | Status: DC

## 2022-06-19 MED ORDER — LACTATED RINGERS IV BOLUS
1000.0000 mL | Freq: Once | INTRAVENOUS | Status: AC
  Administered 2022-06-19: 1000 mL via INTRAVENOUS

## 2022-06-19 MED ORDER — NICOTINE 14 MG/24HR TD PT24
14.0000 mg | MEDICATED_PATCH | Freq: Every day | TRANSDERMAL | Status: DC
  Administered 2022-06-19 – 2022-06-20 (×2): 14 mg via TRANSDERMAL
  Filled 2022-06-19 (×2): qty 1

## 2022-06-19 MED ORDER — ONDANSETRON HCL 4 MG/2ML IJ SOLN: 4.0000 mg | Freq: Four times a day (QID) | INTRAMUSCULAR | Status: DC | PRN

## 2022-06-19 MED ORDER — ACETAMINOPHEN 325 MG PO TABS: 650.0000 mg | ORAL_TABLET | Freq: Four times a day (QID) | ORAL | Status: DC | PRN

## 2022-06-19 MED ORDER — PANTOPRAZOLE SODIUM 40 MG IV SOLR
40.0000 mg | Freq: Once | INTRAVENOUS | Status: AC
  Administered 2022-06-19: 40 mg via INTRAVENOUS
  Filled 2022-06-19: qty 10

## 2022-06-19 MED ORDER — BENZONATATE 100 MG PO CAPS: 100.0000 mg | ORAL_CAPSULE | Freq: Three times a day (TID) | ORAL | Status: DC | PRN

## 2022-06-19 MED ORDER — SUCRALFATE 1 GM/10ML PO SUSP
1.0000 g | Freq: Three times a day (TID) | ORAL | Status: DC
  Administered 2022-06-19 – 2022-06-20 (×5): 1 g via ORAL
  Filled 2022-06-19 (×6): qty 10

## 2022-06-19 MED ORDER — METOCLOPRAMIDE HCL 10 MG PO TABS
10.0000 mg | ORAL_TABLET | Freq: Three times a day (TID) | ORAL | Status: DC
  Administered 2022-06-19 – 2022-06-20 (×4): 10 mg via ORAL
  Filled 2022-06-19 (×4): qty 1

## 2022-06-19 MED ORDER — FENTANYL CITRATE PF 50 MCG/ML IJ SOSY
50.0000 ug | PREFILLED_SYRINGE | Freq: Once | INTRAMUSCULAR | Status: AC
  Administered 2022-06-19: 50 ug via INTRAVENOUS
  Filled 2022-06-19: qty 1

## 2022-06-19 NOTE — Consult Note (Addendum)
Referring Provider:  Dr. Maryan Rued, EDP Primary Care Physician:  Roselee Nova, MD Primary Gastroenterologist:  Dr. Lyndel Safe  Reason for Consultation:  Epigastric abdominal pain  HPI: Analayah Brooke is a 76 y.o. female who has past medical history of AAA that was just repaired in March 2023, history of renal cell carcinoma status post nephrectomy, cholecystectomy, COPD due to longstanding smoker, hypertension, hypercholesterolemia, and GERD.  She has been evaluated the past several months for abdominal pain for which no definite source has been identified.  She presented here to [facility] on this occasion for the same.  She says that the pain is always present, but definitely worsens at times.  Does not necessarily worsen with eating although this particular occasion she had a taco on Monday and this more severe pain started on Tuesday.  Pain is mid to upper abdomen.  Its associated with nausea and episodes of vomiting.  It began in March of this year.  She also had a AAA repair in March, but says that the symptoms were present prior to that.  No diarrhea.  No black or bloody stools.  Labs here show acute kidney injury and her lactic acid is slightly elevated at 2.7 with a mild leukocytosis at 12.5 K.  Most recent CT scan on 05/06/2022 was without contrast and results are as below but she has had 7 CT scans of her abdominal region since March.   IMPRESSION: 1. Bowel loops are normal in caliber. No evidence of colitis or diverticulitis. Normal appendix.   2.  No CT evidence of acute abdominal/pelvic process.   3. Chronic findings including right nephrectomy, cholecystectomy, aorto bi iliac stent graft are unchanged.   4. Chronic compression deformity of the L3 vertebral body and degenerate disc disease of the lumbar spine.    EGD 03/2022:  - LA Grade A reflux esophagitis with no bleeding. Biopsied. - Mild gastritis. Biopsied. - Normal examined duodenum. Biopsied.  1. Surgical  [P], duodenum - DUODENAL MUCOSA WITH NO SPECIFIC PATHOLOGIC DIAGNOSIS. - NORMAL LONG AND SLENDER VILLI, NEGATIVE FOR PATHOLOGIC INTRAEPITHELIAL LYMPHOCYTOSIS. 2. Surgical [P], gastric antrum and gastric body minimal gastritis - GASTRIC OXYNTOMUCINOUS AND OXYNTIC MUCOSA WITH NO SPECIFIC PATHOLOGIC DIAGNOSIS. - NEGATIVE FOR AN INFLAMMATORY PATTERN PREDICTIVE OF HELICOBACTER PYLORI INFECTION. - NEGATIVE FOR INTESTINAL METAPLASIA AND MALIGNANCY. 3. Surgical [P], distal esophagus-esophagitis - REACTIVE SQUAMOCOLUMNAR MUCOSA WITH FEATURES CONSISTENT WITH REFLUX EFFECT (BASAL HYPERPLASIA, ELONGATED PAPILLAE, PARAKERATOSIS). - NEGATIVE FOR INTESTINAL METAPLASIA. - ACUTE INFLAMMATORY CELL ULCER EXUDATE. - NEGATIVE FOR MALIGNANCY. - NO VIRAL CYTOPATHIC CHANGE IDENTIFIED. - GMS STAIN FOR FUNGI PENDING, TO BE REPORTED IN AN ADDENDUM.  She denies NSAID use.  Had been on pantoprazole 40 mg daily and Carafate at home.  Past Medical History:  Diagnosis Date   AAA (abdominal aortic aneurysm) ([place])    Arthritis    Cholelithiasis 2011   s/p cholescystectomy    Chronic diastolic congestive heart failure (HCC)    COPD (chronic obstructive pulmonary disease) (HCC)    GERD (gastroesophageal reflux disease)    History of renal cell carcinoma    Hypercholesteremia    Hypertension    Hypertension    Mitral regurgitation    Pneumonia 1980's   Renal cell carcinoma 01/2010   s/p right radical nephrectomy 01/2010,  followed by [facility]   S/P minimally invasive mitral valve repair 04/20/2019   Complex valvuloplasty including triangular resection of flail segment of posterior leaflet, artificial Gore-tex neochord placement x6 and 30 mm Sorin  Memo 4D ring annuloplasty via right mini thoracotomy approach   Shortness of breath    Tuberculosis    Tests positive for PPD. dad had h/o TB.    Past Surgical History:  Procedure Laterality Date   ABDOMINAL AORTIC ENDOVASCULAR STENT GRAFT N/A 01/10/2022    Procedure: ABDOMINAL AORTIC ENDOVASCULAR STENT GRAFT;  Surgeon: Marty Heck, MD;  Location: [place];  Service: Vascular;  Laterality: N/A;   APPENDECTOMY  1970's   BUBBLE STUDY  03/18/2019   Procedure: BUBBLE STUDY;  Surgeon: Fay Records, MD;  Location: [place];  Service: Cardiovascular;;   CARDIAC CATHETERIZATION  09/2011   CHOLECYSTECTOMY  01/2010   DIAGNOSTIC LAPAROSCOPY     KIDNEY SURGERY     LEFT AND RIGHT HEART CATHETERIZATION WITH CORONARY ANGIOGRAM N/A 09/30/2011   Procedure: LEFT AND RIGHT HEART CATHETERIZATION WITH CORONARY ANGIOGRAM;  Surgeon: Candee Furbish, MD;  Location: [facility] CATH LAB;  Service: Cardiovascular;  Laterality: N/A;   MITRAL VALVE REPAIR Right 04/20/2019   Procedure: MINIMALLY INVASIVE MITRAL VALVE REPAIR (MVR) USING MEMO 4D SIZE 30;  Surgeon: Rexene Alberts, MD;  Location: [place];  Service: Open Heart Surgery;  Laterality: Right;   MULTIPLE EXTRACTIONS WITH ALVEOLOPLASTY  10/06/2011   Procedure: MULTIPLE EXTRACION WITH ALVEOLOPLASTY;  Surgeon: Lenn Cal, DDS;  Location: [place];  Service: Oral Surgery;  Laterality: N/A;  Multiple extraction of tooth #'s 1, 6, 8, 10, 11, 22, 23, 26, 27, 28, 29 with alveoloplasty and Upper right buccal exostoses reductions.   NEPHRECTOMY RADICAL  01/2010   right    ORIF WRIST FRACTURE Right 02/17/2022   Procedure: OPEN REDUCTION INTERNAL FIXATION (ORIF) WRIST FRACTURE;  Surgeon: Iran Planas, MD;  Location: [place];  Service: Orthopedics;  Laterality: Right;  with IV sedation   OVARIAN CYST REMOVAL  1970's   "went through belly button"   RIGHT/LEFT HEART CATH AND CORONARY ANGIOGRAPHY N/A 03/22/2019   Procedure: RIGHT/LEFT HEART CATH AND CORONARY ANGIOGRAPHY;  Surgeon: [name], MD;  Location: [place] CV LAB;  Service: Cardiovascular;  Laterality: N/A;   TEE WITHOUT CARDIOVERSION  10/01/2011   Procedure: TRANSESOPHAGEAL ECHOCARDIOGRAM (TEE);  Surgeon: [name];  Location: [place];  Service:  Cardiovascular;  Laterality: N/A;   TEE WITHOUT CARDIOVERSION N/A 03/18/2019   Procedure: TRANSESOPHAGEAL ECHOCARDIOGRAM (TEE);  Surgeon: Fay Records, MD;  Location: [place];  Service: Cardiovascular;  Laterality: N/A;   TEE WITHOUT CARDIOVERSION N/A 04/20/2019   Procedure: TRANSESOPHAGEAL ECHOCARDIOGRAM (TEE);  Surgeon: Rexene Alberts, MD;  Location: [place];  Service: Open Heart Surgery;  Laterality: N/A;   TUBAL LIGATION  1970's   US ECHOCARDIOGRAPHY  09/2011    Prior to Admission medications   Medication Sig Start Date End Date Taking? Authorizing Provider  acetaminophen (TYLENOL) 500 MG tablet Take 1,000 mg by mouth every 8 (eight) hours as needed for mild pain.    [provider]  albuterol (VENTOLIN HFA) 108 (90 Base) MCG/ACT inhaler Inhale 2 puffs into the lungs every 6 (six) hours as needed for wheezing or shortness of breath. 02/08/20   Brunetta Jeans, PA-C  aspirin EC 81 MG tablet Take 1 tablet (81 mg total) by mouth daily. 09/29/18   Bonnielee Haff, MD  atorvastatin (LIPITOR) 40 MG tablet Take 40 mg by mouth at bedtime.    [provider]  Dexlansoprazole (DEXILANT) 30 MG capsule DR Take 30 mg by mouth daily.    [provider]  ezetimibe (ZETIA) 10 MG tablet  Take 10 mg by mouth daily. 06/11/22   [provider]  famotidine (PEPCID) 20 MG tablet Take 1 tablet (20 mg total) by mouth daily. 01/22/22   Godfrey Pick, MD  loperamide (IMODIUM) 2 MG capsule Take 1 capsule (2 mg total) by mouth every 12 (twelve) hours as needed for diarrhea or loose stools. 01/17/22   Kayleen Memos, DO  losartan (COZAAR) 50 MG tablet Take 50 mg by mouth daily.    [provider]  metoCLOPramide (REGLAN) 10 MG tablet Take 1 tablet (10 mg total) by mouth every 8 (eight) hours as needed for up to 20 doses for nausea. For 1 week 03/14/22   Jackquline Denmark, MD  metoprolol tartrate (LOPRESSOR) 25 MG tablet Take 1 tablet (25 mg total) by mouth 2 (two) times daily. 04/23/21    Kennyth Arnold, FNP  ondansetron (ZOFRAN) 4 MG tablet Take 1 tablet (4 mg total) by mouth daily. 03/13/22   Horton, Barbette Hair, MD  oxyCODONE-acetaminophen (PERCOCET/ROXICET) 5-325 MG tablet Take 1 tablet by mouth every 6 (six) hours as needed for severe pain. 02/13/22   Sponseller, Eugene Garnet R, PA-C  pantoprazole (PROTONIX) 40 MG tablet Take 1 tablet (40 mg total) by mouth daily. 03/14/22   Jackquline Denmark, MD  polyethylene glycol (MIRALAX) 17 g packet Take 17 g by mouth daily. 01/17/22   Kayleen Memos, DO  PROLIA 60 MG/ML SOSY injection Inject 60 mg into the skin every 6 (six) months. 05/16/21   [provider]  promethazine (PHENERGAN) 12.5 MG tablet Take 1 tablet (12.5 mg total) by mouth every 6 (six) hours as needed for nausea or vomiting. 03/14/22   Jackquline Denmark, MD  rosuvastatin (CRESTOR) 40 MG tablet Take 40 mg by mouth daily. 06/11/22   [provider]  sertraline (ZOLOFT) 50 MG tablet Take 50 mg by mouth daily. 05/26/22   [provider]  simethicone (GAS-X) 80 MG chewable tablet Chew 1 tablet (80 mg total) by mouth 4 (four) times daily as needed for up to 5 days for flatulence. 01/17/22 07/26/22  Kayleen Memos, DO  sucralfate (CARAFATE) 1 g tablet Take 1 tablet (1 g total) by mouth 4 (four) times daily -  with meals and at bedtime. 05/07/22   Horton, Barbette Hair, MD  TRELEGY ELLIPTA 200-62.5-25 MCG/INH AEPB Inhale 1 puff into the lungs daily. 07/17/21   [provider]  valACYclovir (VALTREX) 1000 MG tablet Take 1,000 mg by mouth 2 (two) times daily. 01/30/22   [provider]    Current Facility-Administered Medications  Medication Dose Route Frequency Provider Last Rate Last Admin   acetaminophen (TYLENOL) tablet 650 mg  650 mg Oral Q6H PRN Rick Duff, MD       Or   acetaminophen (TYLENOL) suppository 650 mg  650 mg Rectal Q6H PRN Rick Duff, MD       albuterol (PROVENTIL) (2.5 MG/3ML) 0.083% nebulizer solution 2.5 mg  2.5 mg Nebulization  Q4H PRN Rick Duff, MD       heparin injection 5,000 Units  5,000 Units Subcutaneous Q8H Rick Duff, MD       lactated ringers infusion   Intravenous Continuous Blanchie Dessert, MD 75 mL/hr at 06/19/22 0915 New Bag at 06/19/22 0915   ondansetron (ZOFRAN) tablet 4 mg  4 mg Oral Q6H PRN Rick Duff, MD       Or   ondansetron [facility]) injection 4 mg  4 mg Intravenous Q6H PRN Rick Duff, MD  pantoprazole (PROTONIX) injection 40 mg  40 mg Intravenous Q12H Rick Duff, MD       sucralfate (CARAFATE) 1 GM/10ML suspension 1 g  1 g Oral TID WC & HS Rick Duff, MD       Current Outpatient Medications  Medication Sig Dispense Refill   acetaminophen (TYLENOL) 500 MG tablet Take 1,000 mg by mouth every 8 (eight) hours as needed for mild pain.     albuterol (VENTOLIN HFA) 108 (90 Base) MCG/ACT inhaler Inhale 2 puffs into the lungs every 6 (six) hours as needed for wheezing or shortness of breath. 8 g 5   aspirin EC 81 MG tablet Take 1 tablet (81 mg total) by mouth daily. 30 tablet 0   atorvastatin (LIPITOR) 40 MG tablet Take 40 mg by mouth at bedtime.     Dexlansoprazole (DEXILANT) 30 MG capsule DR Take 30 mg by mouth daily.     ezetimibe (ZETIA) 10 MG tablet Take 10 mg by mouth daily.     famotidine (PEPCID) 20 MG tablet Take 1 tablet (20 mg total) by mouth daily. 30 tablet 0   loperamide (IMODIUM) 2 MG capsule Take 1 capsule (2 mg total) by mouth every 12 (twelve) hours as needed for diarrhea or loose stools. 30 capsule 0   losartan (COZAAR) 50 MG tablet Take 50 mg by mouth daily.     metoCLOPramide (REGLAN) 10 MG tablet Take 1 tablet (10 mg total) by mouth every 8 (eight) hours as needed for up to 20 doses for nausea. For 1 week 20 tablet 0   metoprolol tartrate (LOPRESSOR) 25 MG tablet Take 1 tablet (25 mg total) by mouth 2 (two) times daily. 60 tablet 2   ondansetron (ZOFRAN) 4 MG tablet Take 1 tablet (4 mg total) by mouth daily. 20 tablet 0    oxyCODONE-acetaminophen (PERCOCET/ROXICET) 5-325 MG tablet Take 1 tablet by mouth every 6 (six) hours as needed for severe pain. 12 tablet 0   pantoprazole (PROTONIX) 40 MG tablet Take 1 tablet (40 mg total) by mouth daily. 90 tablet 3   polyethylene glycol (MIRALAX) 17 g packet Take 17 g by mouth daily. 14 each 0   PROLIA 60 MG/ML SOSY injection Inject 60 mg into the skin every 6 (six) months.     promethazine (PHENERGAN) 12.5 MG tablet Take 1 tablet (12.5 mg total) by mouth every 6 (six) hours as needed for nausea or vomiting. 45 tablet 0   rosuvastatin (CRESTOR) 40 MG tablet Take 40 mg by mouth daily.     sertraline (ZOLOFT) 50 MG tablet Take 50 mg by mouth daily.     simethicone (GAS-X) 80 MG chewable tablet Chew 1 tablet (80 mg total) by mouth 4 (four) times daily as needed for up to 5 days for flatulence. 20 tablet 0   sucralfate (CARAFATE) 1 g tablet Take 1 tablet (1 g total) by mouth 4 (four) times daily -  with meals and at bedtime. 30 tablet 0   TRELEGY ELLIPTA 200-62.5-25 MCG/INH AEPB Inhale 1 puff into the lungs daily.     valACYclovir (VALTREX) 1000 MG tablet Take 1,000 mg by mouth 2 (two) times daily.      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  Tobacco Use   Smoking status: Every Day    Packs/day: 0.50    Years: 53.00    Total pack years: 26.50    Types: Cigarettes    Last attempt to quit: 03/18/2019    Years since  quitting: 3.2    Passive exposure: Current (Son who lives with her Smokes)   Smokeless tobacco: Never  Vaping Use   Vaping Use: Never used  Substance and Sexual Activity   Alcohol use: Not Currently   Drug use: Not Currently    Types: Cocaine    Comment: pos drug test in epic on 02/07/22   Sexual activity: Not Currently  Other Topics Concern   Not on file  Social History Narrative          Lives in [place] with her husband.    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    Patient has 3 grown children that do not live with her.    Patient does not have any medical insurance.   Social Determinants of Health   Financial Resource Strain: Not on file  Food Insecurity: No Food Insecurity (11/22/2020)   Hunger Vital Sign    Worried About Running Out of Food in the Last Year: Never true    Ran Out of Food in the Last Year: Never true  Transportation Needs: No Transportation Needs (11/22/2020)   PRAPARE - Hydrologist (Medical): No    Lack of Transportation (Non-Medical): No  Physical Activity: Not on file  Stress: Not on file  Social Connections: Not on file  Intimate Partner Violence: Not on file    Review of Systems: ROS is O/W negative except as mentioned in HPI.  Physical Exam: Vital signs in last 24 hours: Temp:  [97.6 F (36.4 C)-98.4 F (36.9 C)] 98.4 F (36.9 C) (08/31 0929) Pulse Rate:  [68-107] 75 (08/31 1015) Resp:  [16-20] 16 (08/31 1015) BP: (90-168)/(61-116) 140/90 (08/31 1015) SpO2:  [95 %-100 %] 100 % (08/31 1015) Weight:  [53.3 kg] 53.3 kg (08/31 0815)   General:  Alert, Well-developed, well-nourished, pleasant and cooperative in NAD Head:  Normocephalic and atraumatic. Eyes:  Sclera clear, no icterus.  Conjunctiva pink. Ears:  Normal auditory acuity. Mouth:  No deformity or lesions.   Lungs:  Clear throughout to auscultation.  No wheezes, crackles, or rhonchi.  Heart:  Regular rate and  rhythm; no murmurs, clicks, rubs, or gallops. Abdomen:  Soft, non-distended.  BS present.  Mid-abdominal to upper abdominal TTP.   Msk:  Symmetrical without gross deformities. Pulses:  Normal pulses noted. Extremities:  Without clubbing or edema. Neurologic:  Alert and oriented x 4;  grossly normal neurologically. Skin:  Intact without significant lesions or rashes. Psych:  Alert and cooperative. Normal mood and affect.  Intake/Output this shift: Total I/O In: 999 [IV Piggyback:999] Out: -   Lab Results: Recent Labs    06/19/22 0530  WBC 12.5*  HGB 14.9  HCT 42.8  PLT 384  BMET Recent Labs    06/19/22 0530  NA 134*  K 3.8  CL 94*  CO2 23  GLUCOSE 140*  BUN 36*  CREATININE 2.18*  CALCIUM 10.6*   LFT Recent Labs    06/19/22 0530  PROT 7.6  ALBUMIN 4.0  AST 19  ALT 13  ALKPHOS 85  BILITOT 1.0   Studies/Results: DG Chest Port 1 View  Result Date: 06/19/2022 CLINICAL DATA:  Upper abdominal pain with nausea and vomiting for 2 days. Generalized weakness. EXAM: PORTABLE CHEST 1 VIEW COMPARISON:  Chest radiograph 01/07/2019 FINDINGS: The cardiomediastinal silhouette is normal. A mitral valve prosthesis is again seen. There is no focal consolidation or pulmonary edema. There is no pleural effusion or pneumothorax There is no acute osseous abnormality. Right axillary surgical clips are again noted. IMPRESSION: Stable chest with no radiographic evidence of acute cardiopulmonary process. Electronically Signed   By: Valetta Mole M.D.   On: 06/19/2022 08:01   DG Abdomen 1 View  Result Date: 06/19/2022 CLINICAL DATA:  Vomiting, nausea, upper abdominal pain, generalized weakness, and lightheadedness for 2 days EXAM: ABDOMEN - 1 VIEW COMPARISON:  Portable exam 0753 hours compared to 02/27/2022 FINDINGS: Prior aortic stenting. Surgical clips RIGHT upper quadrant and mid abdomen. Nonobstructive bowel gas pattern. No bowel dilatation or bowel wall thickening lung bases clear. No  urinary tract calcification. Bones demineralized. IMPRESSION: No acute abnormalities. Electronically Signed   By: Lavonia Dana M.D.   On: 06/19/2022 08:00    IMPRESSION:  *76 year old female with constant, but intermittently worsening mid to upper abdominal pain since March.  This has been associated with about a 25 pound weight loss and nausea with episodes of vomiting.  She does not have a gallbladder.  She has had 7 CT scans since March that have not shown any cause of her pain.  She also had an EGD which just showed some rather mild esophagitis.  She does have some vascular issues, but celiac and SMA were patent on previous imaging studies.  Does not seem that this is functional as she has never had this in the past, does not have any history of anxiety, etc.  She just had a AAA repair in March, but says that her symptoms were present prior to that. *Acute kidney injury: Due to recent poor p.o. intake.  She has a solitary kidney due to history of nephrectomy for cancer.  PLAN: -Continue pantoprazole 40 mg IV twice daily and Carafate suspension 4 times daily for now.  Continue antiemetics, IV fluids. -We will discuss with Dr. Candis Schatz.   Janett Billow D. Zehr  06/19/2022, 10:55 AM  I have taken a history, reviewed the chart and examined the patient. I performed a substantive portion of this encounter, including complete performance of at least one of the key components, in conjunction with the APP. I agree with the APP's note, impression and recommendations  Ms. Gregg is a very pleasant 76 year old female with chronic tobacco use/COPD, AAA s/p endovascular repair, renal cell CA s/p nephrectomy, s/p remote cholecystectomy who has been experiencing recurrent and persistent abdominal pain with nausea, vomiting and weight loss since March of this year.   Her symptoms have been episodic, but when present, can last for a week or two.  She has been able to go over a month without symptoms. Her symptoms  can occur on an empty stomach and are not necessarily worsened with eating, but sometimes they are.  She has lost 20lbs which she thinks is due  to decreased PO intake secondary to pain and nausea. Work up as included numerous CT scans, including a CT-A which demonstrated normal appearing celiac artery and SMA take-offs without stenosis, as well as a normal EGD.  No biliary dilation or elevated liver enzymes during her pain episodes, making SOD unlikely. She has no history of chronic GI symptoms and is an atypical patient for a functional GI disorder.  She is not taking any new medications that would typically cause severe nausea/vomiting.  Etiology of her symptoms remains unclear.  I think a gastric emptying study would be helpful at some point to rule out gastroparesis. In the meantime, would recommend a trial of scheduled Reglan to see if this helps her nausea (would also be suggestive of gastroparesis). Will get fecal elastase to evaluate for chronic pancreatitis. Would also get inspiratory/expiratory [place] with dopplers if possible to further rule out celiac artery compression.  Abdominal pain, nausea/vomiting - Reglan 10 mg PO TID - Fecal elastase - Inspiratory/expiratory [place] w/ dopplers - Gastric emptying study   Winda Summerall E. Candis Schatz, MD [facility]

## 2022-06-19 NOTE — ED Notes (Signed)
ED TO INPATIENT HANDOFF REPORT  ED Nurse Name and Phone #: Jaziyah Gradel [phone]  S Name/Age/Gender Katie [name] 76 y.o. female Room/Bed: 043C/043C  Code Status   Code Status: Full Code  Home/SNF/Other Home Patient oriented to: self, place, time, and situation Is this baseline? Yes   Triage Complete: Triage complete  Chief Complaint Intractable vomiting with nausea [R11.2]  Triage Note Pt c/o upper abdominal pain, nausea and vomiting x 2 days. Also c/o generalized weakness and feeling lightheaded.    Allergies No Known Allergies  Level of Care/Admitting Diagnosis ED Disposition     ED Disposition  Admit   Condition  --   Comment  Hospital Area: [place] [[id number]]  Level of Care: Med-Surg [16]  May place patient in observation at [facility] or [place] if equivalent level of care is available:: Yes  Covid Evaluation: Symptomatic Person Under Investigation (PUI) or recent exposure (last 10 days) *Testing Required*  Diagnosis: Intractable vomiting with nausea [1017510]  Admitting Physician: Sid Falcon [[id number]]  Attending Physician: Cresenciano Lick          B Medical/Surgery History Past Medical History:  Diagnosis Date   AAA (abdominal aortic aneurysm) ([place])    Arthritis    Cholelithiasis 2011   s/p cholescystectomy    Chronic diastolic congestive heart failure (HCC)    COPD (chronic obstructive pulmonary disease) ([place])    GERD (gastroesophageal reflux disease)    History of renal cell carcinoma    Hypercholesteremia    Hypertension    Hypertension    Mitral regurgitation    Pneumonia 1980's   Renal cell carcinoma 01/2010   s/p right radical nephrectomy 01/2010,  followed by [facility]   S/P minimally invasive mitral valve repair 04/20/2019   Complex valvuloplasty including triangular resection of flail segment of posterior leaflet, artificial Gore-tex neochord placement x6 and 30 mm Sorin Memo 4D ring annuloplasty  via right mini thoracotomy approach   Shortness of breath    Tuberculosis    Tests positive for PPD. dad had h/o TB.   Past Surgical History:  Procedure Laterality Date   ABDOMINAL AORTIC ENDOVASCULAR STENT GRAFT N/A 01/10/2022   Procedure: ABDOMINAL AORTIC ENDOVASCULAR STENT GRAFT;  Surgeon: Marty Heck, MD;  Location: [place];  Service: Vascular;  Laterality: N/A;   APPENDECTOMY  1970's   BUBBLE STUDY  03/18/2019   Procedure: BUBBLE STUDY;  Surgeon: Fay Records, MD;  Location: [place];  Service: Cardiovascular;;   CARDIAC CATHETERIZATION  09/2011   CHOLECYSTECTOMY  01/2010   DIAGNOSTIC LAPAROSCOPY     KIDNEY SURGERY     LEFT AND RIGHT HEART CATHETERIZATION WITH CORONARY ANGIOGRAM N/A 09/30/2011   Procedure: LEFT AND RIGHT HEART CATHETERIZATION WITH CORONARY ANGIOGRAM;  Surgeon: Candee Furbish, MD;  Location: [facility] CATH LAB;  Service: Cardiovascular;  Laterality: N/A;   MITRAL VALVE REPAIR Right 04/20/2019   Procedure: MINIMALLY INVASIVE MITRAL VALVE REPAIR (MVR) USING MEMO 4D SIZE 30;  Surgeon: Rexene Alberts, MD;  Location: [place];  Service: Open Heart Surgery;  Laterality: Right;   MULTIPLE EXTRACTIONS WITH ALVEOLOPLASTY  10/06/2011   Procedure: MULTIPLE EXTRACION WITH ALVEOLOPLASTY;  Surgeon: Lenn Cal, DDS;  Location: [place];  Service: Oral Surgery;  Laterality: N/A;  Multiple extraction of tooth #'s 1, 6, 8, 10, 11, 22, 23, 26, 27, 28, 29 with alveoloplasty and Upper right buccal exostoses reductions.   NEPHRECTOMY RADICAL  01/2010   right    ORIF WRIST FRACTURE  Right 02/17/2022   Procedure: OPEN REDUCTION INTERNAL FIXATION (ORIF) WRIST FRACTURE;  Surgeon: Iran Planas, MD;  Location: [place];  Service: Orthopedics;  Laterality: Right;  with IV sedation   OVARIAN CYST REMOVAL  1970's   "went through belly button"   RIGHT/LEFT HEART CATH AND CORONARY ANGIOGRAPHY N/A 03/22/2019   Procedure: RIGHT/LEFT HEART CATH AND CORONARY ANGIOGRAPHY;  Surgeon: [name], MD;   Location: [facility] CV LAB;  Service: Cardiovascular;  Laterality: N/A;   TEE WITHOUT CARDIOVERSION  10/01/2011   Procedure: TRANSESOPHAGEAL ECHOCARDIOGRAM (TEE);  Surgeon: [name];  Location: [place];  Service: Cardiovascular;  Laterality: N/A;   TEE WITHOUT CARDIOVERSION N/A 03/18/2019   Procedure: TRANSESOPHAGEAL ECHOCARDIOGRAM (TEE);  Surgeon: Fay Records, MD;  Location: [place];  Service: Cardiovascular;  Laterality: N/A;   TEE WITHOUT CARDIOVERSION N/A 04/20/2019   Procedure: TRANSESOPHAGEAL ECHOCARDIOGRAM (TEE);  Surgeon: Rexene Alberts, MD;  Location: [place];  Service: Open Heart Surgery;  Laterality: N/A;   TUBAL LIGATION  1970's   US ECHOCARDIOGRAPHY  09/2011     A IV Location/Drains/Wounds Patient Lines/Drains/Airways Status     Active Line/Drains/Airways     Name Placement date Placement time Site Days   Peripheral IV 06/19/22 20 G Anterior;Left;Proximal Forearm 06/19/22  1338  Forearm  less than 1   Incision (Closed) 01/10/22 Groin Right 01/10/22  0844  -- 160   Incision (Closed) 01/10/22 Groin Left 01/10/22  0844  -- 160   Incision (Closed) 02/17/22 Arm 02/17/22  1411  -- 122            Intake/Output Last 24 hours  Intake/Output Summary (Last 24 hours) at 06/19/2022 1607 Last data filed at 06/19/2022 5784 Gross per 24 hour  Intake 999 ml  Output --  Net 999 ml    Labs/Imaging Results for orders placed or performed during the hospital encounter of 06/19/22 (from the past 48 hour(s))  Urinalysis, Routine w reflex microscopic     Status: Abnormal   Collection Time: 06/19/22  5:24 AM  Result Value Ref Range   Color, Urine YELLOW YELLOW   APPearance CLOUDY (A) CLEAR   Specific Gravity, Urine 1.018 1.005 - 1.030   pH 5.0 5.0 - 8.0   Glucose, UA NEGATIVE NEGATIVE mg/dL   Hgb urine dipstick NEGATIVE NEGATIVE   Bilirubin Urine NEGATIVE NEGATIVE   Ketones, ur NEGATIVE NEGATIVE mg/dL   Protein, ur 30 (A) NEGATIVE mg/dL   Nitrite NEGATIVE  NEGATIVE   Leukocytes,Ua TRACE (A) NEGATIVE   RBC / HPF 0-5 0 - 5 RBC/hpf   WBC, UA 0-5 0 - 5 WBC/hpf   Bacteria, UA RARE (A) NONE SEEN   Squamous Epithelial / LPF 0-5 0 - 5   Mucus PRESENT    Hyaline Casts, UA PRESENT    Granular Casts, UA PRESENT    Non Squamous Epithelial 0-5 (A) NONE SEEN    Comment: Performed at [facility], [address] [place], [place] [address]  Lipase, blood     Status: None   Collection Time: 06/19/22  5:30 AM  Result Value Ref Range   Lipase 41 11 - 51 U/L    Comment: Performed at [facility] [address] [place], [place] [address]  Comprehensive metabolic panel     Status: Abnormal   Collection Time: 06/19/22  5:30 AM  Result Value Ref Range   Sodium 134 (L) 135 - 145 mmol/L   Potassium 3.8 3.5 - 5.1 mmol/L  Chloride 94 (L) 98 - 111 mmol/L   CO2 23 22 - 32 mmol/L   Glucose, Bld 140 (H) 70 - 99 mg/dL    Comment: Glucose reference range applies only to samples taken after fasting for at least 8 hours.   BUN 36 (H) 8 - 23 mg/dL   Creatinine, Ser 2.18 (H) 0.44 - 1.00 mg/dL   Calcium 10.6 (H) 8.9 - 10.3 mg/dL   Total Protein 7.6 6.5 - 8.1 g/dL   Albumin 4.0 3.5 - 5.0 g/dL   AST 19 15 - 41 U/L   ALT 13 0 - 44 U/L   Alkaline Phosphatase 85 38 - 126 U/L   Total Bilirubin 1.0 0.3 - 1.2 mg/dL   GFR, Estimated 23 (L) >60 mL/min    Comment: (NOTE) Calculated using the CKD-EPI Creatinine Equation (2021)    Anion gap 17 (H) 5 - 15    Comment: Performed at [facility], [place] [address] [place], [place] [address]  CBC     Status: Abnormal   Collection Time: 06/19/22  5:30 AM  Result Value Ref Range   WBC 12.5 (H) 4.0 - 10.5 K/uL   RBC 4.95 3.87 - 5.11 MIL/uL   Hemoglobin 14.9 12.0 - 15.0 g/dL   HCT 42.8 36.0 - 46.0 %   MCV 86.5 80.0 - 100.0 fL   MCH 30.1 26.0 - 34.0 pg   MCHC 34.8 30.0 - 36.0 g/dL   RDW 12.5 11.5 - 15.5 %   Platelets 384 150 - 400 K/uL   nRBC 0.0 0.0 - 0.2 %    Comment: Performed at [facility],  [place] [address] [place], [place] [address]  Lactic acid, plasma     Status: Abnormal   Collection Time: 06/19/22  7:44 AM  Result Value Ref Range   Lactic Acid, Venous 2.7 (HH) 0.5 - 1.9 mmol/L    Comment: CRITICAL RESULT CALLED TO, READ BACK BY AND VERIFIED WITH D.GUILBEAULT RN [phone] 06/19/22 MCCORMICK K Performed at [facility], [place] [address] [place], [place] [address]   Lactic acid, plasma     Status: Abnormal   Collection Time: 06/19/22 11:20 AM  Result Value Ref Range   Lactic Acid, Venous 2.3 (HH) 0.5 - 1.9 mmol/L    Comment: CRITICAL VALUE NOTED. VALUE IS CONSISTENT WITH PREVIOUSLY REPORTED/CALLED VALUE Performed at [facility], [place] [address] [place], [place] [address]   Hemoglobin A1c     Status: None   Collection Time: 06/19/22 11:21 AM  Result Value Ref Range   Hgb A1c MFr Bld 5.3 4.8 - 5.6 %    Comment: (NOTE) Pre diabetes:          5.7%-6.4%  Diabetes:              >6.4%  Glycemic control for   <7.0% adults with diabetes    Mean Plasma Glucose 105.41 mg/dL    Comment: Performed at [facility] [address], [place], [place] [address]  TSH     Status: None   Collection Time: 06/19/22 11:21 AM  Result Value Ref Range   TSH 1.739 0.350 - 4.500 uIU/mL    Comment: Performed by a 3rd Generation assay with a functional sensitivity of <=0.01 uIU/mL. Performed at [facility], [place] [address] [place], [place] [address]    DG Chest Port 1 View  Result Date: 06/19/2022 CLINICAL DATA:  Upper abdominal pain with nausea and vomiting for  2 days. Generalized weakness. EXAM: PORTABLE CHEST 1 VIEW COMPARISON:  Chest radiograph 01/07/2019 FINDINGS: The cardiomediastinal silhouette is normal. A mitral valve prosthesis is again seen. There is no focal consolidation or pulmonary edema. There is no pleural effusion or pneumothorax There is no acute osseous abnormality. Right axillary surgical clips are again noted. IMPRESSION: Stable chest with no radiographic  evidence of acute cardiopulmonary process. Electronically Signed   By: Valetta Mole M.D.   On: 06/19/2022 08:01   DG Abdomen 1 View  Result Date: 06/19/2022 CLINICAL DATA:  Vomiting, nausea, upper abdominal pain, generalized weakness, and lightheadedness for 2 days EXAM: ABDOMEN - 1 VIEW COMPARISON:  Portable exam 0753 hours compared to 02/27/2022 FINDINGS: Prior aortic stenting. Surgical clips RIGHT upper quadrant and mid abdomen. Nonobstructive bowel gas pattern. No bowel dilatation or bowel wall thickening lung bases clear. No urinary tract calcification. Bones demineralized. IMPRESSION: No acute abnormalities. Electronically Signed   By: Lavonia Dana M.D.   On: 06/19/2022 08:00    Pending Labs Unresulted Labs (From admission, onward)     Start     Ordered   06/20/22 0500  Comprehensive metabolic panel  Tomorrow morning,   R        06/19/22 1043   06/20/22 0500  CBC  Tomorrow morning,   R        06/19/22 1043   06/19/22 1440  Pancreatic elastase, fecal  Once,   R        06/19/22 1441   06/19/22 1440  C-reactive protein  Once,   R        06/19/22 1441   06/19/22 1344  SARS Coronavirus 2 by RT PCR (hospital order, performed in [facility] hospital lab) *cepheid single result test* Anterior Nasal Swab  (Tier 2 - Symptomatic/Asymptomatic)  Once,   R        06/19/22 1344   06/19/22 1052  VITAMIN D 25 Hydroxy (Vit-D Deficiency, Fractures)  Once,   R        06/19/22 1051   06/19/22 1049  Lactic acid, plasma  STAT Now then every 3 hours,   R      06/19/22 1048   06/19/22 1047  Parathyroid hormone, intact (no Ca)  Once,   R        06/19/22 1046            Vitals/Pain Today's Vitals   06/19/22 1330 06/19/22 1545 06/19/22 1606 06/19/22 1606  BP:  120/74    Pulse:  70    Resp:  19    Temp: 98.8 F (37.1 C)   97.9 F (36.6 C)  TempSrc: Oral   Oral  SpO2:  97%    Weight:      PainSc:   0-No pain     Isolation Precautions Airborne and Contact  precautions  Medications Medications  lactated ringers infusion ( Intravenous New Bag/Given 06/19/22 1602)  heparin injection 5,000 Units (5,000 Units Subcutaneous Given 06/19/22 1558)  acetaminophen (TYLENOL) tablet 650 mg (has no administration in time range)    Or  acetaminophen (TYLENOL) suppository 650 mg (has no administration in time range)  ondansetron (ZOFRAN) tablet 4 mg (has no administration in time range)    Or  ondansetron (ZOFRAN) injection 4 mg (has no administration in time range)  pantoprazole (PROTONIX) injection 40 mg (has no administration in time range)  sucralfate (CARAFATE) 1 GM/10ML suspension 1 g (1 g Oral Given 06/19/22 1600)  albuterol (PROVENTIL) (2.5 MG/3ML) 0.083% nebulizer solution  2.5 mg (has no administration in time range)  fluticasone furoate-vilanterol (BREO ELLIPTA) 200-25 MCG/ACT 1 puff (has no administration in time range)    And  umeclidinium bromide (INCRUSE ELLIPTA) 62.5 MCG/ACT 1 puff (has no administration in time range)  benzonatate (TESSALON) capsule 100 mg (has no administration in time range)  nicotine (NICODERM CQ - dosed in mg/24 hours) patch 14 mg (14 mg Transdermal Patch Applied 06/19/22 1559)  metoCLOPramide (REGLAN) tablet 10 mg (10 mg Oral Given 06/19/22 1600)  lactated ringers bolus 1,000 mL (0 mLs Intravenous Stopped 06/19/22 0914)  pantoprazole (PROTONIX) injection 40 mg (40 mg Intravenous Given 06/19/22 0748)  metoCLOPramide (REGLAN) injection 10 mg (10 mg Intravenous Given 06/19/22 0746)  fentaNYL (SUBLIMAZE) injection 50 mcg (50 mcg Intravenous Given 06/19/22 0748)    Mobility walks Low fall risk    R Recommendations: See Admitting Provider Note  Report given to:   Additional Notes:

## 2022-06-19 NOTE — Hospital Course (Addendum)
9/1 - feeling much better today. Eating and drinking. No nausea or vomiting. Still coughing but relates to COPD. No complaints. Feels great about DC today. Sees Dr. Reuel [facility] as PCP. She can call him today to schedule appt.   [name] is a 76 yo M with a PMH of RCC s/p R radical nephrectomy, non obstructive CAD, COPD, severe mitral regurgitation s/p mitral valve repair 04/2019, AAA s/p EVAR 01/2022, PAD s/p HLD, and HTN who presents to the ED with acute on chronic abdominal pain with associated N/V.    Patient reports that she has had intermittent episodes of abdominal pain with associated nausea and vomiting since 12/2021. She states when her symptoms first began she felt that they were precipitated by something that she ate. She had several episodes of non-bloody non-bilious clear emesis and pain in the epigastrium and RUQ at that time. She had no diarrhea, fever, or constipation. She underwent evaluation at [facility] at that time and has undergone several other evaluations since then. Multiple Cts of the abdomen have showed no clear source of her abdominal pain. She underwent Cta of the abdomen/pelvis which showed patient vasculature of the abdomen. She has also undergone a recent EGD (03/2022) which showed mild gastritis and esophagitis with no other concerning findings. She also underwent AAA repair as it was thought this could be contributing to her symptoms. She has since undergone re-evaluation by vascular surgery (02/2022) who noted that her persistent abdominal pain is likely not related to her repair.   Patient notes that she has had intervals where she is able to tolerate solid foods. She states that her current symptoms began four days from the day of admission. Prior to this she was eating bland foods with no N/V. She states that four days ago however she ate tacos that she feels were spicy. She began having ~10 episodes of NB/NB emesis that were clear in appearance every day  since that time. She also continues to have some associated epigastric pain. Patient also states that she sometimes has emesis when after a coughing spell but states this does not seem to be predominant precipitating factor for her emesis. She has had about a 40lb weight loss since 01/2022.   She again has had no fevers but does note some chills, no diarrhea or constipation, no recent sick contacts., no hematemesis, hematochezia, or melena.    Of note, patient continues to use cocaine but does not feel her symptoms are precipitated by cocaine use.

## 2022-06-19 NOTE — H&P (Signed)
Date: 06/19/2022               Patient Name:  Katie [name] MRN: [id number]  DOB: 20-Apr-1946 Age / Sex: 76 y.o., female   PCP: Katie Nova, MD         Medical Service: Internal Medicine Teaching Service         Attending Physician: Dr. Sid Falcon, MD    First Contact: Dr. Starlyn Skeans, MD  Pager: [phone]  Second Contact: Dr. Iona Beard, MD  Pager: [phone]       After Hours (After 5p/  First Contact Pager: [phone]  weekends / holidays): Second Contact Pager: [phone]   Chief Complaint: intractable N/V   History of Present Illness:   [name] is a 76 yo M with a PMH of RCC s/p R radical nephrectomy, non obstructive CAD, COPD, severe mitral regurgitation s/p mitral valve repair 04/2019, AAA s/p EVAR 01/2022, PAD, HLD, and HTN who presents to the ED with acute on chronic abdominal pain with associated N/V.    Patient reports that she has had intermittent episodes of abdominal pain with associated nausea and vomiting since 12/2021. She states when her symptoms first began she felt that they were precipitated by something that she ate. She had several episodes of non-bloody non-bilious clear emesis and pain in the epigastrium and RUQ at that time. She had no diarrhea, fever, or constipation. She underwent evaluation at [facility] at that time and has undergone several other evaluations since then. Multiple Cts of the abdomen have showed no clear source of her abdominal pain. She underwent Cta of the abdomen/pelvis which showed patient vasculature of the abdomen. She has also undergone a recent EGD (03/2022) which showed mild gastritis and esophagitis with no other concerning findings. She also underwent AAA repair as it was thought this could be contributing to her symptoms. She has since undergone re-evaluation by vascular surgery (02/2022) who noted that her persistent abdominal pain is likely not related to her repair.   Patient notes that she has had intervals  where she is able to tolerate solid foods. She states that her current symptoms began four days from the day of admission. Prior to this she was eating bland foods with no N/V. She states that four days ago however she ate tacos that she feels were spicy. She began having ~10 episodes of NB/NB emesis that were clear in appearance every day since that time. She also continues to have some associated epigastric pain. Patient also states that she sometimes has emesis when after a coughing spell but states this does not seem to be predominant precipitating factor for her emesis. She has had about a 40lb weight loss since 01/2022.   She again has had no fevers but does note some chills, no diarrhea or constipation, no recent sick contacts., no hematemesis, hematochezia, or melena.    Of note, patient continues to use cocaine but does not feel her symptoms are precipitated by cocaine use.  Meds:  No current facility-administered medications on file prior to encounter.   Current Outpatient Medications on File Prior to Encounter  Medication Sig Dispense Refill   acetaminophen (TYLENOL) 500 MG tablet Take 1,000 mg by mouth every 8 (eight) hours as needed for mild pain.     albuterol (VENTOLIN HFA) 108 (90 Base) MCG/ACT inhaler Inhale 2 puffs into the lungs every 6 (six) hours as needed for wheezing or shortness of breath. 8 g 5  aspirin EC 81 MG tablet Take 1 tablet (81 mg total) by mouth daily. 30 tablet 0   Dexlansoprazole (DEXILANT) 30 MG capsule DR Take 30 mg by mouth daily.     ezetimibe (ZETIA) 10 MG tablet Take 10 mg by mouth daily.     losartan (COZAAR) 50 MG tablet Take 50 mg by mouth daily.     metoCLOPramide (REGLAN) 10 MG tablet Take 1 tablet (10 mg total) by mouth every 8 (eight) hours as needed for up to 20 doses for nausea. For 1 week 20 tablet 0   metoprolol tartrate (LOPRESSOR) 25 MG tablet Take 1 tablet (25 mg total) by mouth 2 (two) times daily. 60 tablet 2   ondansetron (ZOFRAN) 4 MG  tablet Take 1 tablet (4 mg total) by mouth daily. 20 tablet 0   pantoprazole (PROTONIX) 40 MG tablet Take 1 tablet (40 mg total) by mouth daily. 90 tablet 3   polyethylene glycol (MIRALAX) 17 g packet Take 17 g by mouth daily. 14 each 0   PROLIA 60 MG/ML SOSY injection Inject 60 mg into the skin every 6 (six) months.     promethazine (PHENERGAN) 12.5 MG tablet Take 1 tablet (12.5 mg total) by mouth every 6 (six) hours as needed for nausea or vomiting. 45 tablet 0   rosuvastatin (CRESTOR) 40 MG tablet Take 40 mg by mouth daily.     sertraline (ZOLOFT) 50 MG tablet Take 50 mg by mouth daily.     simethicone (GAS-X) 80 MG chewable tablet Chew 1 tablet (80 mg total) by mouth 4 (four) times daily as needed for up to 5 days for flatulence. 20 tablet 0   sucralfate (CARAFATE) 1 g tablet Take 1 tablet (1 g total) by mouth 4 (four) times daily -  with meals and at bedtime. 30 tablet 0   TRELEGY ELLIPTA 200-62.5-25 MCG/INH AEPB Inhale 1 puff into the lungs daily.        Allergies: Allergies as of 06/19/2022   (No Known Allergies)   Past Medical History:  Diagnosis Date   AAA (abdominal aortic aneurysm) ([place])    Arthritis    Cholelithiasis 2011   s/p cholescystectomy    Chronic diastolic congestive heart failure (H[facility])    COPD (chronic obstructive pulmonary disease) (H[facility])    GERD (gastroesophageal reflux disease)    History of renal cell carcinoma    Hypercholesteremia    Hypertension    Hypertension    Mitral regurgitation    Pneumonia 1980's   Renal cell carcinoma 01/2010   s/p right radical nephrectomy 01/2010,  followed by [facility]   S/P minimally invasive mitral valve repair 04/20/2019   Complex valvuloplasty including triangular resection of flail segment of posterior leaflet, artificial Gore-tex neochord placement x6 and 30 mm Sorin Memo 4D ring annuloplasty via right mini thoracotomy approach   Shortness of breath    Tuberculosis    Tests positive for PPD. dad had h/o TB.     Family History:  Family History  Problem Relation Age of Onset   COPD Mother        DECEASED/SMOKED   Diabetes Brother    Diabetes Brother    Colon cancer Neg Hx    Rectal cancer Neg Hx    Esophageal cancer Neg Hx    Stomach cancer Neg Hx      Social History:  Current ppd smoker for the past 58 years. She is a once a week cocaine user (snorting) since her 30s. No other  illicit drugs. No alcohol use.  Her son lives with her and she is able to perform all of her iadls/adls without difficulty.   Review of Systems: A complete ROS was negative except as per HPI.   Physical Exam: Blood pressure 139/79, pulse 76, temperature 98.8 F (37.1 C), temperature source Oral, resp. rate 20, weight (S) 53.3 kg, SpO2 98 %.  Constitutional: Well-developed, well-nourished, and in no distress.  HENT:  Head: Normocephalic and atraumatic.  Eyes: EOM are normal.  Neck: Normal range of motion.  Cardiovascular: Normal rate, regular rhythm, intact distal pulses. No gallop and no friction rub.  No murmur heard. No lower extremity edema  Pulmonary: Non labored breathing on room air, no wheezing or rales. Rhonchi breath sounds with expiration diffusely  Abdominal: Soft. Normal bowel sounds. Non distended, TTP in RUQ and epigastrium with mild palpation  Musculoskeletal: Normal range of motion.        General: No tenderness or edema.  Neurological: Alert and oriented to person, place, and time. Non focal  Skin: Skin is warm and dry.    EKG: personally reviewed my interpretation is NSR, LVH  CXR: personally reviewed my interpretation is no acute cardiopulmonary process.   KUB: Normal   Assessment & Plan by Problem: Principal Problem:   Intractable vomiting with nausea  [name] is a 76 yo M with a PMH of RCC s/p R radical nephrectomy, non obstructive CAD, COPD, severe mitral regurgitation s/p mitral valve repair 04/2019, AAA s/p EVAR 01/2022, PAD,  HLD, and HTN who presents to the ED  with acute on chronic abdominal pain with associated N/V with unclear etiology despite being worked up several times.   #Intractable N/V #Epigastric pain  Unclear etiology of patient's current presenting symptoms. She has had multiple recent CT scans of the abdomen and pelvis which have been unrevealing. She had a recent EGD which showed mild gastritis and esophagitis. Patient is s/p cholecystectomy and has had negative lipase levels. Patient could have acutely worsening gastritis resulting in her presentation from her persistent cocaine use which has been corroborated by recent urine tox screens. This could have also resulted in ulcer disease. Patient's weight loss of 40lbs since 01/2022, h/o RCC, and hypercalcemia does raise the suspicion for malignancy but her recent CT abdomen pelvis scans have been negative for masses. She also had a CT of her chest in 12/2021 a pleural based nodule in the RML which has been unchanged since dedicated imaging for this and was noted to be be, and calcification of the R hemidiaphragm. She also has not gamma gap. Patient's symptoms could also have a functional component.  -Will continue IVFs and antiemetics -PPI BID  -Order gastric emptying study   #AKI on CKD #H/o RCC s/p R radical nephrectomy Unclear patient's baseline serum creatinine. Appears to be 1.2-1.4. Discussed with patient the importance of cocaine cessation.  -IVFs per above -TOC consult for resources regarding cocaine cessation  #COPD  #Tobacco use disorder Continue patient's home inhalers and PRN albuterol. On RA.   #Hypercalcemia  Noted intermittently on lab work over the years. Will check PTH and vitamin D level. Unclear if patient is taking calcium supplementation as she does not recall her medications.  -F/u PTH, vit D    #HTN Normotensive Hold home antihypertensives   #HLD #Non obs CAD Chronic medical problems:   #HLD Resume statin when able to take PO.    Dispo: Admit patient to  Observation with expected length of stay less than  2 midnights.  Signed: Rick Duff, MD 06/19/2022, 1:48 PM  After 5pm on weekdays and 1pm on weekends: On Call pager: [phone]

## 2022-06-19 NOTE — ED Triage Notes (Signed)
Pt c/o upper abdominal pain, nausea and vomiting x 2 days. Also c/o generalized weakness and feeling lightheaded.

## 2022-06-19 NOTE — ED Provider Notes (Addendum)
[facility] EMERGENCY DEPARTMENT Provider Note   CSN: [id number] Arrival date & time: 06/19/22  8546     History  Chief Complaint  Patient presents with   Abdominal Pain    Katie [name] is a 76 y.o. female.  Pt is a 76y/o female with hx of COPD with continued smoking (not on O2, followed by pulm), HTN, CHF, PVD, HLD, AAA s/p graft, s/p R nephrectomy, CKD(baseline Cr 1.5) who has been following with [facility] over the last few months due to persistent epigastric abd pain, recurrent n/v, weight loss with EGD in June showing Grade A esophagitis and gastritis on ppi and dietary restrictions who has appt tomorrow with GI but presents to the ER today due to worsening abd pain since tues after she ate tacos with n/v and upper abd pain.  She reports that the pain is just becoming more severe.  She usually can deal with the pain but now it is unbearable.  She has had >10 episodes of vomiting daily but is able to hold some stuff down.  She has vomitted her medications and not sure how much of that she is holding down.  No diarrhea or documented fever.  She also feels like she has been wheezing more but is using her inhaler.  No SOB at this time.  No change in her chronic cough.  She is not on steroids at this time and denies any recent medication changes.  She continues to lose weight.  The history is provided by the patient and medical records.  Abdominal Pain      Home Medications Prior to Admission medications   Medication Sig Start Date End Date Taking? Authorizing Provider  acetaminophen (TYLENOL) 500 MG tablet Take 1,000 mg by mouth every 8 (eight) hours as needed for mild pain.    [provider]  albuterol (VENTOLIN HFA) 108 (90 Base) MCG/ACT inhaler Inhale 2 puffs into the lungs every 6 (six) hours as needed for wheezing or shortness of breath. 02/08/20   Brunetta Jeans, PA-C  aspirin EC 81 MG tablet Take 1 tablet (81 mg total) by mouth daily. 09/29/18    Bonnielee Haff, MD  atorvastatin (LIPITOR) 40 MG tablet Take 40 mg by mouth at bedtime.    [provider]  famotidine (PEPCID) 20 MG tablet Take 1 tablet (20 mg total) by mouth daily. 01/22/22   Godfrey Pick, MD  loperamide (IMODIUM) 2 MG capsule Take 1 capsule (2 mg total) by mouth every 12 (twelve) hours as needed for diarrhea or loose stools. 01/17/22   Kayleen Memos, DO  losartan (COZAAR) 50 MG tablet Take 50 mg by mouth daily.    [provider]  metoCLOPramide (REGLAN) 10 MG tablet Take 1 tablet (10 mg total) by mouth every 8 (eight) hours as needed for up to 20 doses for nausea. For 1 week 03/14/22   Jackquline Denmark, MD  metoprolol tartrate (LOPRESSOR) 25 MG tablet Take 1 tablet (25 mg total) by mouth 2 (two) times daily. 04/23/21   Kennyth Arnold, FNP  Misc. Devices (PULSE OXIMETER FOR FINGER) MISC 1 Device by Does not apply route as needed. 03/11/19   Brunetta Jeans, PA-C  ondansetron (ZOFRAN) 4 MG tablet Take 1 tablet (4 mg total) by mouth daily. 03/13/22   Horton, Barbette Hair, MD  oxyCODONE-acetaminophen (PERCOCET/ROXICET) 5-325 MG tablet Take 1 tablet by mouth every 6 (six) hours as needed for severe pain. 02/13/22   Sponseller, Gypsy Balsam, PA-C  pantoprazole (PROTONIX) 40 MG tablet Take 1 tablet (40 mg total) by mouth daily. 03/14/22   Jackquline Denmark, MD  polyethylene glycol (MIRALAX) 17 g packet Take 17 g by mouth daily. 01/17/22   Kayleen Memos, DO  PROLIA 60 MG/ML SOSY injection Inject 60 mg into the skin every 6 (six) months. 05/16/21   [provider]  promethazine (PHENERGAN) 12.5 MG tablet Take 1 tablet (12.5 mg total) by mouth every 6 (six) hours as needed for nausea or vomiting. 03/14/22   Jackquline Denmark, MD  simethicone (GAS-X) 80 MG chewable tablet Chew 1 tablet (80 mg total) by mouth 4 (four) times daily as needed for up to 5 days for flatulence. 01/17/22 03/29/22  Kayleen Memos, DO  sucralfate (CARAFATE) 1 g tablet Take 1 tablet (1 g total) by mouth 4 (four)  times daily -  with meals and at bedtime. 05/07/22   Horton, Barbette Hair, MD  TRELEGY ELLIPTA 200-62.5-25 MCG/INH AEPB Inhale 1 puff into the lungs daily. 07/17/21   [provider]  valACYclovir (VALTREX) 1000 MG tablet Take 1,000 mg by mouth 2 (two) times daily. 01/30/22   [provider]      Allergies    Patient has no known allergies.    Review of Systems   Review of Systems  Gastrointestinal:  Positive for abdominal pain.    Physical Exam Updated Vital Signs BP 100/72   Pulse 92   Temp 97.6 F (36.4 C) (Oral)   Resp 17   Wt (S) 53.3 kg   SpO2 99%   BMI 22.20 kg/m  Physical Exam Vitals and nursing note reviewed.  Constitutional:      General: She is in acute distress.     Appearance: She is well-developed.     Comments: Pt appears uncomfortable rocking in the bed  HENT:     Head: Normocephalic and atraumatic.     Mouth/Throat:     Mouth: Mucous membranes are dry.  Eyes:     Pupils: Pupils are equal, round, and reactive to light.  Cardiovascular:     Rate and Rhythm: Regular rhythm. Tachycardia present.     Heart sounds: Normal heart sounds. No murmur heard.    No friction rub.  Pulmonary:     Effort: Pulmonary effort is normal.     Breath sounds: Examination of the right-lower field reveals decreased breath sounds. Examination of the left-lower field reveals decreased breath sounds. Decreased breath sounds present. No wheezing or rales.  Abdominal:     General: Abdomen is flat. Bowel sounds are normal. There is no distension.     Palpations: Abdomen is soft.     Tenderness: There is abdominal tenderness in the epigastric area, periumbilical area and left upper quadrant. There is no right CVA tenderness, left CVA tenderness, guarding or rebound.     Hernia: No hernia is present.  Musculoskeletal:        General: No tenderness. Normal range of motion.     Right lower leg: No edema.     Left lower leg: No edema.     Comments: No edema  Skin:     General: Skin is warm and dry.     Findings: No rash.  Neurological:     Mental Status: She is alert and oriented to person, place, and time. Mental status is at baseline.     Cranial Nerves: No cranial nerve deficit.  Psychiatric:        Behavior: Behavior normal.  ED Results / Procedures / Treatments   Labs (all labs ordered are listed, but only abnormal results are displayed) Labs Reviewed  COMPREHENSIVE METABOLIC PANEL - Abnormal; Notable for the following components:      Result Value   Sodium 134 (*)    Chloride 94 (*)    Glucose, Bld 140 (*)    BUN 36 (*)    Creatinine, Ser 2.18 (*)    Calcium 10.6 (*)    GFR, Estimated 23 (*)    Anion gap 17 (*)    All other components within normal limits  CBC - Abnormal; Notable for the following components:   WBC 12.5 (*)    All other components within normal limits  LACTIC ACID, PLASMA - Abnormal; Notable for the following components:   Lactic Acid, Venous 2.7 (*)    All other components within normal limits  LIPASE, BLOOD  URINALYSIS, ROUTINE W REFLEX MICROSCOPIC    EKG EKG Interpretation  Date/Time:  Thursday June 19 2022 05:26:34 EDT Ventricular Rate:  99 PR Interval:  130 QRS Duration: 90 QT Interval:  386 QTC Calculation: 495 R Axis:   14 Text Interpretation: Normal sinus rhythm Possible Left atrial enlargement Left ventricular hypertrophy with repolarization abnormality ( Sokolow-Lyon , Cornell product ) Prolonged QT Abnormal ECG  Spacebar Confirmed by Ripley Fraise [phone]) on 06/19/2022 6:54:53 AM  Radiology DG Chest Port 1 View  Result Date: 06/19/2022 CLINICAL DATA:  Upper abdominal pain with nausea and vomiting for 2 days. Generalized weakness. EXAM: PORTABLE CHEST 1 VIEW COMPARISON:  Chest radiograph 01/07/2019 FINDINGS: The cardiomediastinal silhouette is normal. A mitral valve prosthesis is again seen. There is no focal consolidation or pulmonary edema. There is no pleural effusion or pneumothorax  There is no acute osseous abnormality. Right axillary surgical clips are again noted. IMPRESSION: Stable chest with no radiographic evidence of acute cardiopulmonary process. Electronically Signed   By: Valetta Mole M.D.   On: 06/19/2022 08:01   DG Abdomen 1 View  Result Date: 06/19/2022 CLINICAL DATA:  Vomiting, nausea, upper abdominal pain, generalized weakness, and lightheadedness for 2 days EXAM: ABDOMEN - 1 VIEW COMPARISON:  Portable exam 0753 hours compared to 02/27/2022 FINDINGS: Prior aortic stenting. Surgical clips RIGHT upper quadrant and mid abdomen. Nonobstructive bowel gas pattern. No bowel dilatation or bowel wall thickening lung bases clear. No urinary tract calcification. Bones demineralized. IMPRESSION: No acute abnormalities. Electronically Signed   By: Lavonia Dana M.D.   On: 06/19/2022 08:00    Procedures Procedures    Medications Ordered in ED Medications  lactated ringers infusion (has no administration in time range)  lactated ringers bolus 1,000 mL (1,000 mLs Intravenous New Bag/Given 06/19/22 0746)  pantoprazole (PROTONIX) injection 40 mg (40 mg Intravenous Given 06/19/22 0748)  metoCLOPramide (REGLAN) injection 10 mg (10 mg Intravenous Given 06/19/22 0746)  fentaNYL (SUBLIMAZE) injection 50 mcg (50 mcg Intravenous Given 06/19/22 0102)    ED Course/ Medical Decision Making/ A&P                           Medical Decision Making Amount and/or Complexity of Data Reviewed External Data Reviewed: notes.    Details: Gi notes Labs: ordered. Decision-making details documented in ED Course. Radiology: ordered and independent interpretation performed. Decision-making details documented in ED Course. ECG/medicine tests: ordered and independent interpretation performed. Decision-making details documented in ED Course.  Risk Prescription drug management. Decision regarding hospitalization.   Pt with multiple medical problems and  comorbidities and presenting today with a  complaint that caries a high risk for morbidity and mortality.  Patient is a 76 year old female returning today with recurrent abdominal pain and vomiting.  Patient has been dealing with this since March when she developed abdominal pain which has not had a specific etiology.  Patient has had weight loss, ongoing nausea vomiting.  She has had CTA of her abdomen as she is a vasculopath and has a AAA status post graft in March which shows patent vessels, CT multiple times without acute findings, most recently an EGD in June showing grade a esophagitis and gastritis currently on PPI and avoiding NSAIDs and certain foods but is returning today for persistent symptoms.  She did report she had some tacos on Tuesday and that has worsened her pain and symptoms and now the pain is unbearable which she feels is a little different than her typical issues.  She reports normally she can deal with the pain but the pain has become severe this time.  She has more than 10 episodes of vomiting per day but is able to keep some stuff down.  She has not had any diarrhea or fevers.  She has no other medication changes.  Here today patient has upper abdominal pain.  She has only 1 kidney due to cancer where 1 was taken out and typical creatinine is 1.5.  I have independently interpreted patient's EKG and labs today.  EKG shows a new prolonged QT and tachycardia but otherwise no other changes.  Her CBC shows a mild leukocytosis of 12.5, hemoconcentration with a hemoglobin of 14.9 and normal platelet count, CMP with new AKI with creatinine of 2.18, chloride of 94 and normal sodium and potassium.  Patient is noted to have an anion gap of 17 and a lactate was ordered.  Patient also has a normal lipase.  We will do a KUB to ensure no evidence of obstruction however suspect this is exacerbation of the ongoing process that has been present since March that is still unknown.  She was supposed to see Anderson Malta lemons in the office with [facility]  tomorrow but because she was feeling so poorly she came to the ER.  She is given IV fluids, antiemetic and pain control.  She was also given IV Protonix that she was not sure she had been holding it down.  Patient is noted to have soft blood pressures at 95/75 but is mentating normally and sats are 97% on room air.   I have independently visualized and interpreted pt's images today.  CXR and KUB without acute findings today.  Lactic acid is elevated at 2.7.  pt is now 53kg from 58kg 1 month ago.   We will consult [facility] for further care.  Suspect patient will need admission to the hospital due to AKI, dehydration, persistent symptoms with unclear etiology.  Blood pressure did improve with IV fluids now in the low 100s. Spoke with Janett Billow with [facility] who will come see the pt.  Will admit for further care for above sx.          Final Clinical Impression(s) / ED Diagnoses Final diagnoses:  Pain of upper abdomen  Dehydration  AKI (acute kidney injury) [facility])    Rx / DC Orders ED Discharge Orders     None         Blanchie Dessert, MD 06/19/22 3790    Blanchie Dessert, MD 06/19/22 [phone]

## 2022-06-20 DIAGNOSIS — N179 Acute kidney failure, unspecified: Secondary | ICD-10-CM | POA: Diagnosis not present

## 2022-06-20 DIAGNOSIS — J449 Chronic obstructive pulmonary disease, unspecified: Secondary | ICD-10-CM | POA: Diagnosis not present

## 2022-06-20 DIAGNOSIS — R112 Nausea with vomiting, unspecified: Secondary | ICD-10-CM | POA: Diagnosis not present

## 2022-06-20 DIAGNOSIS — N189 Chronic kidney disease, unspecified: Secondary | ICD-10-CM

## 2022-06-20 DIAGNOSIS — Z20822 Contact with and (suspected) exposure to covid-19: Secondary | ICD-10-CM | POA: Diagnosis not present

## 2022-06-20 DIAGNOSIS — I130 Hypertensive heart and chronic kidney disease with heart failure and stage 1 through stage 4 chronic kidney disease, or unspecified chronic kidney disease: Secondary | ICD-10-CM | POA: Diagnosis not present

## 2022-06-20 LAB — CBC
HCT: 34.7 % — ABNORMAL LOW (ref 36.0–46.0)
Hemoglobin: 11.8 g/dL — ABNORMAL LOW (ref 12.0–15.0)
MCH: 30.2 pg (ref 26.0–34.0)
MCHC: 34 g/dL (ref 30.0–36.0)
MCV: 88.7 fL (ref 80.0–100.0)
Platelets: 215 10*3/uL (ref 150–400)
RBC: 3.91 MIL/uL (ref 3.87–5.11)
RDW: 12.6 % (ref 11.5–15.5)
WBC: 6.2 10*3/uL (ref 4.0–10.5)
nRBC: 0 % (ref 0.0–0.2)

## 2022-06-20 LAB — COMPREHENSIVE METABOLIC PANEL
ALT: 10 U/L (ref 0–44)
AST: 14 U/L — ABNORMAL LOW (ref 15–41)
Albumin: 2.9 g/dL — ABNORMAL LOW (ref 3.5–5.0)
Alkaline Phosphatase: 60 U/L (ref 38–126)
Anion gap: 7 (ref 5–15)
BUN: 28 mg/dL — ABNORMAL HIGH (ref 8–23)
CO2: 27 mmol/L (ref 22–32)
Calcium: 9.2 mg/dL (ref 8.9–10.3)
Chloride: 103 mmol/L (ref 98–111)
Creatinine, Ser: 1.65 mg/dL — ABNORMAL HIGH (ref 0.44–1.00)
GFR, Estimated: 32 mL/min — ABNORMAL LOW (ref >60)
Glucose, Bld: 86 mg/dL (ref 70–99)
Potassium: 3.5 mmol/L (ref 3.5–5.1)
Sodium: 137 mmol/L (ref 135–145)
Total Bilirubin: 1.1 mg/dL (ref 0.3–1.2)
Total Protein: 5.6 g/dL — ABNORMAL LOW (ref 6.5–8.1)

## 2022-06-20 LAB — PARATHYROID HORMONE, INTACT (NO CA): PTH: 60 pg/mL (ref 15–65)

## 2022-06-20 MED ORDER — SUCRALFATE 1 GM/10ML PO SUSP: 1.0000 g | Freq: Three times a day (TID) | ORAL | 0 refills | Status: DC

## 2022-06-20 MED ORDER — METOCLOPRAMIDE HCL 10 MG PO TABS: 10.0000 mg | ORAL_TABLET | Freq: Three times a day (TID) | ORAL | 0 refills | Status: DC

## 2022-06-20 NOTE — Progress Notes (Cosign Needed)
[facility] Progress Note  CC:  Epigastric abdominal pain  Subjective:  Feeling better with Reglan.  Going to be discharged shortly.  Objective:  Vital signs in last 24 hours: Temp:  [96.9 F (36.1 C)-99.1 F (37.3 C)] 98.1 F (36.7 C) (09/01 0400) Pulse Rate:  [62-98] 62 (09/01 0400) Resp:  [13-22] 17 (09/01 0400) BP: (97-139)/(61-82) 101/61 (09/01 0400) SpO2:  [96 %-100 %] 97 % (09/01 0848) Last BM Date : 06/18/22 General:  Alert, Well-developed, in NAD Heart:  Regular rate and rhythm; no murmurs Pulm:  CTAB.  No W/R/R. Abdomen:  Soft, non-distended.  BS present.  Minimal TTP. Extremities:  Without edema. Neurologic:  Alert and oriented x 4;  grossly normal neurologically. Psych:  Alert and cooperative. Normal mood and affect.  Intake/Output from previous day: 08/31 0701 - 09/01 0700 In: 999 [IV Piggyback:999] Out: -   Lab Results: Recent Labs    06/19/22 0530 06/20/22 0751  WBC 12.5* 6.2  HGB 14.9 11.8*  HCT 42.8 34.7*  PLT 384 215   BMET Recent Labs    06/19/22 0530 06/20/22 0751  NA 134* 137  K 3.8 3.5  CL 94* 103  CO2 23 27  GLUCOSE 140* 86  BUN 36* 28*  CREATININE 2.18* 1.65*  CALCIUM 10.6* 9.2   LFT Recent Labs    06/20/22 0751  PROT 5.6*  ALBUMIN 2.9*  AST 14*  ALT 10  ALKPHOS 60  BILITOT 1.1   DG Chest Port 1 View  Result Date: 06/19/2022 CLINICAL DATA:  Upper abdominal pain with nausea and vomiting for 2 days. Generalized weakness. EXAM: PORTABLE CHEST 1 VIEW COMPARISON:  Chest radiograph 01/07/2019 FINDINGS: The cardiomediastinal silhouette is normal. A mitral valve prosthesis is again seen. There is no focal consolidation or pulmonary edema. There is no pleural effusion or pneumothorax There is no acute osseous abnormality. Right axillary surgical clips are again noted. IMPRESSION: Stable chest with no radiographic evidence of acute cardiopulmonary process. Electronically Signed   By: Valetta Mole M.D.   On:  06/19/2022 08:01   DG Abdomen 1 View  Result Date: 06/19/2022 CLINICAL DATA:  Vomiting, nausea, upper abdominal pain, generalized weakness, and lightheadedness for 2 days EXAM: ABDOMEN - 1 VIEW COMPARISON:  Portable exam 0753 hours compared to 02/27/2022 FINDINGS: Prior aortic stenting. Surgical clips RIGHT upper quadrant and mid abdomen. Nonobstructive bowel gas pattern. No bowel dilatation or bowel wall thickening lung bases clear. No urinary tract calcification. Bones demineralized. IMPRESSION: No acute abnormalities. Electronically Signed   By: Lavonia Dana M.D.   On: 06/19/2022 08:00    Assessment / Plan: *76 year old female with constant, but intermittently worsening mid to upper abdominal pain since March.  This has been associated with about a 25 pound weight loss and nausea with episodes of vomiting.  She does not have a gallbladder.  She has had 7 CT scans since March that have not shown any cause of her pain.  She also had an EGD which just showed some rather mild esophagitis.  She does have some vascular issues, but celiac and SMA were patent on previous imaging studies.  Does not seem that this is functional as she has never had this in the past, does not have any history of anxiety, etc.  She just had a AAA repair in March, but says that her symptoms were present prior to that. *Acute kidney injury: Due to recent poor p.o. intake.  She has a solitary kidney due to history  of nephrectomy for cancer.  - Reglan 10 mg PO TID. - Fecal elastase can be collected as an outpatient. - Inspiratory/expiratory [place] w/ dopplers as an outpatient if we can try to find somewhere that performs this close by and if we think still needed. - Gastric emptying study can be considered as an outpatient. -Has a follow-up appt with me on 9/26 at 1:30 PM.     LOS: 0 days   Laban Emperor. Nicoles Sedlacek  06/20/2022, 11:24 AM   I have reviewed the chart.  I agree with the APP's note, impression and recommendations.  The patient  was discharged home before I was able to see the patient  Scott E. Candis Schatz, MD [facility]

## 2022-06-20 NOTE — TOC Transition Note (Signed)
Transition of Care [facility]) - CM/SW Discharge Note   Patient Details  Name: Katie [name] MRN: [id number] Date of Birth: [birth year]  [facility] CM/SW Contact:  Benard Halsted, LCSW Phone Number: 06/20/2022, 11:38 AM   Clinical Narrative:    CSW received consult regarding substance use. CSW met with patient. She stated that she has been using cocaine sometimes out of boredom of being in the house. She accepted resources provided by CSW and CSW encouraged her to also utilize resources that offer online counseling as well if she is unable to leave the house to attend treatment.   She confirmed PCP and stated that her family will hopefully be able to pick her up for discharge. No other concerns noted at this time.    Final next level of care: Home/Self Care Barriers to Discharge: No Barriers Identified   Patient Goals and CMS Choice Patient states their goals for this hospitalization and ongoing recovery are:: Return home      Discharge Placement                       Discharge Plan and Services                                     Social Determinants of Health (SDOH) Interventions     Readmission Risk Interventions     No data to display

## 2022-06-20 NOTE — Discharge Summary (Signed)
Name: Katie [name] MRN: [id number] DOB: [birth year] 75 y.o. PCP: Roselee Nova, MD  Date of Admission: 06/19/2022  5:18 AM Date of Discharge: 06/20/2022 Attending Physician: Sid Falcon, MD  Discharge Diagnosis: 1. Principal Problem:   Intractable vomiting with nausea AKI on CKD Hypercalcemia  Discharge Medications: Allergies as of 06/20/2022   No Known Allergies      Medication List     STOP taking these medications    Dexilant 30 MG capsule DR Generic drug: Dexlansoprazole       TAKE these medications    acetaminophen 500 MG tablet Commonly known as: TYLENOL Take 1,000 mg by mouth every 8 (eight) hours as needed for mild pain.   albuterol 108 (90 Base) MCG/ACT inhaler Commonly known as: VENTOLIN HFA Inhale 2 puffs into the lungs every 6 (six) hours as needed for wheezing or shortness of breath.   aspirin EC 81 MG tablet Take 1 tablet (81 mg total) by mouth daily.   ezetimibe 10 MG tablet Commonly known as: ZETIA Take 10 mg by mouth daily.   losartan 50 MG tablet Commonly known as: COZAAR Take 50 mg by mouth daily.   metoCLOPramide 10 MG tablet Commonly known as: REGLAN Take 1 tablet (10 mg total) by mouth 4 (four) times daily -  before meals and at bedtime.   metoprolol tartrate 25 MG tablet Commonly known as: LOPRESSOR Take 1 tablet (25 mg total) by mouth 2 (two) times daily.   pantoprazole 40 MG tablet Commonly known as: PROTONIX Take 1 tablet (40 mg total) by mouth daily.   rosuvastatin 40 MG tablet Commonly known as: CRESTOR Take 40 mg by mouth daily.   sertraline 50 MG tablet Commonly known as: ZOLOFT Take 50 mg by mouth daily.   sucralfate 1 GM/10ML suspension Commonly known as: CARAFATE Take 10 mLs (1 g total) by mouth 4 (four) times daily -  with meals and at bedtime.   Trelegy Ellipta 200-62.5-25 MCG/ACT Aepb Generic drug: Fluticasone-Umeclidin-Vilant Inhale 1 puff into the lungs daily.        Disposition and  follow-up:   Ms.Katie [name] was discharged from [facility] in Good condition.  At the hospital follow up visit please address:  1.  Nausea, vomiting, and abdominal pain  2.  Labs / imaging needed at time of follow-up: Gastric emptying study, inspiratory/expiratory [place] w/ doppler, BMP  3.  Pending labs/ test needing follow-up: fecal elastase (r/o chronic pancreatitis), DG bone density, PTH, Vitamin D, CT chest lung cancer screening  Follow-up Appointments: - Will follow-up with primary care provider Dr. Manuella Ghazi within the next 1-2 weeks. - Will follow-up with [facility] within the next 1-2 weeks to discuss gastroenterology recommendations (gastric emptying study and inspiratory/expiratory ultrasound with dopplers).              - To be arranged by Dr. Christeen Douglas Course by problem list:  Katie [name] is a 76 y/o female with past medical history of RCC s/p R radical nephrectomy, non obstructive CAD, COPD, severe mitral regurgitation s/p mitral valve repair 04/2019, AAA s/p EVAR 01/2022, PAD,  HLD, and HTN that presents to the ED with acute on chronic abdominal pain and N/V with unclear etiology despite several previous workups.    # Intractable N/V # Epigastric pain  Patient has been experiencing intermittent episodes of similar symptoms since 12/2021.  Etiology of her symptoms was unclear. Received multiple recent CT scans of the abdomen and pelvis prior  to hospitalization which are unrevealing. Recent EGD demonstrated mild gastritis and esophagitis. Patient is s/p cholecystectomy and lipase WNL on admission. Symptoms were thought to be caused by worsening gastritis due to persistent cocaine use vs functional component. Patient also reported weight loss of 40lbs since 01/2022. RCC history and hypercalcemia raises suspicion for malignancy but recent CT scans negative for masses. CT chest in 12/2021 demonstrated a pleural based nodule in the RML and  calcification of the R hemidiaphragm, unchanged since initial dedicated imaging. No gamma gap present. Lactic acid and CRP WNL on admission.  Symptoms improved with Reglan and sucralfate.  Patient also given IV Protonix. GI recommends inspiratory/expiratory ultrasound with Dopplers as well as gastric emptying study. GI also recommended following up on fecal elastase. Patient will follow up with GI as an outpatient to have the studies done.  Patient discharged with prescription for Reglan and sucralfate.   2. # AKI on CKD # H/o RCC s/p R radical nephrectomy Baseline creatinine appears to be 1.2-1.4. Creatinine 2.18 on admission. IVF given. Creatinine improved to 1.65 on day of discharge. Discussed with patient the importance of maintaining hydration and cocaine cessation.    3. # COPD  4. # Tobacco use disorder No respiratory distress on admission.  SPO2 remained normal on RA. Gave PRN albuterol and home breo ellipta/incruse ellipta.   5. # Hypercalcemia  Present intermittently on lab work over the last couple of years. Unclear if patient is taking excessive calcium supplementation as she is unsure of her medications. Calcium 10.6 on admission. Corrected calcium 10.1 on day of discharge. Still pending PTH and Vitamin D on day of discharge.    6. # HTN Normotensive throughout her stay here. Held home Losartan 50 mg and metoprolol 25 mg. Medications continued upon discharge.   7. # HLD # Non obs CAD Chronic conditions.Home rosuvastatin 40 mg held due to patient not tolerating PO. Medication continued upon discharge.   Discharge Exam:   BP 101/61 (BP Location: Right Arm)   Pulse 62   Temp 98.1 F (36.7 C) (Oral)   Resp 17   Wt (S) 53.3 kg   SpO2 97%   BMI 22.20 kg/m  Constitutional: Well-developed, well-nourished, and in no distress.  HENT:  Head: Normocephalic and atraumatic.  Eyes: EOM are normal.  Neck: Normal range of motion.  Cardiovascular: Normal rate, regular rhythm, intact  distal pulses. No gallop and no friction rub.  No murmur heard. No lower extremity edema  Pulmonary: Non labored breathing on room air, no wheezing, rales, or rhonchi. Abdominal: Soft. Normal bowel sounds. Non distended. No tenderness. Musculoskeletal: Normal range of motion.     Neurological: Alert and oriented to person, place, and time. No focal deficits.  Skin: Skin is warm and dry   Pertinent Labs, Studies, and Procedures:     Latest Ref Rng & Units 06/20/2022    7:51 AM 06/19/2022    5:30 AM 05/06/2022    1:08 PM  CBC  WBC 4.0 - 10.5 K/uL 6.2  12.5  12.1   Hemoglobin 12.0 - 15.0 g/dL 11.8  14.9  15.9   Hematocrit 36.0 - 46.0 % 34.7  42.8  45.9   Platelets 150 - 400 K/uL 215  384  372        Latest Ref Rng & Units 06/20/2022    7:51 AM 06/19/2022    5:30 AM 05/06/2022    1:08 PM  BMP  Glucose 70 - 99 mg/dL 86  140  123  BUN 8 - 23 mg/dL 28  36  40   Creatinine 0.44 - 1.00 mg/dL 1.65  2.18  1.80   Sodium 135 - 145 mmol/L 137  134  134   Potassium 3.5 - 5.1 mmol/L 3.5  3.8  3.2   Chloride 98 - 111 mmol/L 103  94  92   CO2 22 - 32 mmol/L '27  23  26   '$ Calcium 8.9 - 10.3 mg/dL 9.2  10.6  11.0     DG Abdomen IMPRESSION: No acute abnormalities.  Discharge Instructions: Discharge Instructions     Call MD for:  persistant dizziness or light-headedness   Complete by: As directed    Call MD for:  persistant nausea and vomiting   Complete by: As directed    Call MD for:  severe uncontrolled pain   Complete by: As directed    Diet - low sodium heart healthy   Complete by: As directed    Increase activity slowly   Complete by: As directed       You were hospitalized for nausea, vomiting, and abdominal pain.   Hospital course: -We treated your nausea, vomiting, and abdominal pain with medications. We planned to get studies assessing the function of your GI tract but gastroenterology is comfortable having these tests done in the clinic after discharge.   Medications:    Please start taking: -Metoclopramide (Reglan) 10 mg tablet (once by mouth 4 times daily before meals and at bedtime) for nausea and vomiting -Sucralfate (Carafate) 1 GM/10 mL suspension (take 10 mL by mouth 4 times daily with meals and at bedtime) for nausea and vomiting   Please stop taking: -Dexlansoprazole (Dexilant) 30 mg capsule (take 1 tablet by mouth) for heartburn; no need for two proton pump inhibitors (other is Pantoprazole)   Please continue taking: -Acetaminophen (Tylenol) 500 mg tablet (take 2 tablets by mouth every 8 hours as needed) for pain -Albuterol (Ventolin HFA) MCG/ACT inhaler (inhale 2 puffs into the lungs every 6 hours as needed) for wheezing or shortness of breath -Aspirin 81 mg tablet (take 1 tablet by mouth daily) for your coronary artery disease -Ezetimibe (Zetia) 10 mg tablet (1 tablet by mouth daily) for high cholesterol -Losartan (Cozaar) 50 mg tablet (take 1 tablet by mouth daily) for high blood pressure -Metoprolol tartrate (Lopressor) 25 mg tablet (1 tablet by mouth twice daily) for high blood pressure -Pantoprazole (Protonix) 40 mg tablet (1 tablet by mouth daily) for heartburn -Rosuvastatin (Crestor) 40 mg tablet (1 tablet by mouth daily) for high cholesterol -Sertraline (Zoloft) 50 mg tablet (1 tablet by mouth daily) for anxiety -Trelegy Ellipta MCG/INH (inhale 1 puff into the lungs daily) for your COPD   Follow-up: -Please follow-up with your primary care provider Dr. Manuella Ghazi within the next 1-2 weeks. -Please follow-up with [facility] within the next 1-2 weeks to discuss gastroenterology recommendations (gastric emptying study and inspiratory/expiratory ultrasound with dopplers).              - Dr. Candis Schatz will arrange this  Signed: Starlyn Skeans, MD 06/20/2022, 11:21 AM   Pager: [phone]

## 2022-06-20 NOTE — Progress Notes (Signed)
Nsg Discharge Note  Admit Date:  06/19/2022 Discharge date: 06/20/2022   Apolonia Ellwood to be D/C'd Home per MD order.  AVS completed. Patient/caregiver able to verbalize understanding.  Discharge Medication: Allergies as of 06/20/2022   No Known Allergies      Medication List     STOP taking these medications    Dexilant 30 MG capsule DR Generic drug: Dexlansoprazole       TAKE these medications    acetaminophen 500 MG tablet Commonly known as: TYLENOL Take 1,000 mg by mouth every 8 (eight) hours as needed for mild pain.   albuterol 108 (90 Base) MCG/ACT inhaler Commonly known as: VENTOLIN HFA Inhale 2 puffs into the lungs every 6 (six) hours as needed for wheezing or shortness of breath.   aspirin EC 81 MG tablet Take 1 tablet (81 mg total) by mouth daily.   ezetimibe 10 MG tablet Commonly known as: ZETIA Take 10 mg by mouth daily.   losartan 50 MG tablet Commonly known as: COZAAR Take 50 mg by mouth daily.   metoCLOPramide 10 MG tablet Commonly known as: REGLAN Take 1 tablet (10 mg total) by mouth 4 (four) times daily -  before meals and at bedtime.   metoprolol tartrate 25 MG tablet Commonly known as: LOPRESSOR Take 1 tablet (25 mg total) by mouth 2 (two) times daily.   pantoprazole 40 MG tablet Commonly known as: PROTONIX Take 1 tablet (40 mg total) by mouth daily.   rosuvastatin 40 MG tablet Commonly known as: CRESTOR Take 40 mg by mouth daily.   sertraline 50 MG tablet Commonly known as: ZOLOFT Take 50 mg by mouth daily.   sucralfate 1 GM/10ML suspension Commonly known as: CARAFATE Take 10 mLs (1 g total) by mouth 4 (four) times daily -  with meals and at bedtime.   Trelegy Ellipta 200-62.5-25 MCG/ACT Aepb Generic drug: Fluticasone-Umeclidin-Vilant Inhale 1 puff into the lungs daily.        Discharge Assessment: Vitals:   06/20/22 0400 06/20/22 0848  BP: 101/61   Pulse: 62   Resp: 17   Temp: 98.1 F (36.7 C)   SpO2: 96% 97%    Skin clean, dry and intact without evidence of skin break down, no evidence of skin tears noted. IV catheter discontinued intact. Site without signs and symptoms of complications - no redness or edema noted at insertion site, patient denies c/o pain - only slight tenderness at site.  Dressing with slight pressure applied.  D/c Instructions-Education: Discharge instructions given to patient/family with verbalized understanding. D/c education completed with patient/family including follow up instructions, medication list, d/c activities limitations if indicated, with other d/c instructions as indicated by MD - patient able to verbalize understanding, all questions fully answered. Patient instructed to return to ED, call 911, or call MD for any changes in condition.  Patient escorted via [place], and D/C home via private auto.  Atilano Ina, RN 06/20/2022 12:05 PM

## 2022-06-20 NOTE — Care Management Obs Status (Signed)
[place] NOTIFICATION   Patient Details  Name: Katie [name] MRN: [id number] Date of Birth: 04/20/1946   Medicare Observation Status Notification Given:  Yes    Carles Collet, RN 06/20/2022, 11:31 AM

## 2022-06-20 NOTE — Discharge Instructions (Addendum)
You were hospitalized for nausea, vomiting, and abdominal pain.  Hospital course: -We treated your nausea, vomiting, and abdominal pain with medications. We planned to get studies assessing the function of your GI tract but gastroenterology is comfortable having these tests done in the clinic after discharge.  Medications:  Please start taking: -Metoclopramide (Reglan) 10 mg tablet (once by mouth 4 times daily before meals and at bedtime) for nausea and vomiting -Sucralfate (Carafate) 1 GM/10 mL suspension (take 10 mL by mouth 4 times daily with meals and at bedtime) for nausea and vomiting  Please stop taking: -Dexlansoprazole (Dexilant) 30 mg capsule (take 1 tablet by mouth) for heartburn; no need for two proton pump inhibitors (other is Pantoprazole)  Please continue taking: -Acetaminophen (Tylenol) 500 mg tablet (take 2 tablets by mouth every 8 hours as needed) for pain -Albuterol (Ventolin HFA) MCG/ACT inhaler (inhale 2 puffs into the lungs every 6 hours as needed) for wheezing or shortness of breath -Aspirin 81 mg tablet (take 1 tablet by mouth daily) for your coronary artery disease -Ezetimibe (Zetia) 10 mg tablet (1 tablet by mouth daily) for high cholesterol -Losartan (Cozaar) 50 mg tablet (take 1 tablet by mouth daily) for high blood pressure -Metoprolol tartrate (Lopressor) 25 mg tablet (1 tablet by mouth twice daily) for high blood pressure -Pantoprazole (Protonix) 40 mg tablet (1 tablet by mouth daily) for heartburn -Rosuvastatin (Crestor) 40 mg tablet (1 tablet by mouth daily) for high cholesterol -Sertraline (Zoloft) 50 mg tablet (1 tablet by mouth daily) for anxiety -Trelegy Ellipta MCG/INH (inhale 1 puff into the lungs daily) for your COPD  Follow-up: -Please follow-up with your primary care provider Dr. Manuella Ghazi within the next 1-2 weeks. -Please follow-up with [facility] within the next 1-2 weeks to discuss gastroenterology recommendations (gastric emptying  study and inspiratory/expiratory ultrasound with dopplers).   - Dr. Candis Schatz will arrange this

## 2022-07-15 ENCOUNTER — Ambulatory Visit: Payer: Medicare Other | Admitting: Gastroenterology

## 2022-07-17 ENCOUNTER — Emergency Department (HOSPITAL_COMMUNITY): Payer: Medicare Other

## 2022-07-17 ENCOUNTER — Other Ambulatory Visit: Payer: Self-pay

## 2022-07-17 ENCOUNTER — Emergency Department (HOSPITAL_COMMUNITY)
Admission: EM | Admit: 2022-07-17 | Discharge: 2022-07-18 | Disposition: A | Discharge status: Home / Self Care | Payer: Medicare Other | Attending: Emergency Medicine | Admitting: Emergency Medicine

## 2022-07-17 ENCOUNTER — Encounter (HOSPITAL_COMMUNITY): Payer: Self-pay | Admitting: Emergency Medicine

## 2022-07-17 DIAGNOSIS — R1010 Upper abdominal pain, unspecified: Secondary | ICD-10-CM | POA: Insufficient documentation

## 2022-07-17 DIAGNOSIS — R1110 Vomiting, unspecified: Secondary | ICD-10-CM | POA: Diagnosis present

## 2022-07-17 DIAGNOSIS — Z5321 Procedure and treatment not carried out due to patient leaving prior to being seen by health care provider: Secondary | ICD-10-CM | POA: Insufficient documentation

## 2022-07-17 DIAGNOSIS — Z20822 Contact with and (suspected) exposure to covid-19: Secondary | ICD-10-CM | POA: Insufficient documentation

## 2022-07-17 LAB — URINALYSIS, ROUTINE W REFLEX MICROSCOPIC
Bilirubin Urine: NEGATIVE
Glucose, UA: NEGATIVE mg/dL
Hgb urine dipstick: NEGATIVE
Ketones, ur: 5 mg/dL — AB
Nitrite: NEGATIVE
Protein, ur: 100 mg/dL — AB
Specific Gravity, Urine: 1.025 (ref 1.005–1.030)
pH: 5 (ref 5.0–8.0)

## 2022-07-17 LAB — COMPREHENSIVE METABOLIC PANEL
ALT: 17 U/L (ref 0–44)
AST: 24 U/L (ref 15–41)
Albumin: 4.3 g/dL (ref 3.5–5.0)
Alkaline Phosphatase: 82 U/L (ref 38–126)
Anion gap: 17 — ABNORMAL HIGH (ref 5–15)
BUN: 43 mg/dL — ABNORMAL HIGH (ref 8–23)
CO2: 25 mmol/L (ref 22–32)
Calcium: 11 mg/dL — ABNORMAL HIGH (ref 8.9–10.3)
Chloride: 93 mmol/L — ABNORMAL LOW (ref 98–111)
Creatinine, Ser: 2.09 mg/dL — ABNORMAL HIGH (ref 0.44–1.00)
GFR, Estimated: 24 mL/min — ABNORMAL LOW (ref >60)
Glucose, Bld: 124 mg/dL — ABNORMAL HIGH (ref 70–99)
Potassium: 3.8 mmol/L (ref 3.5–5.1)
Sodium: 135 mmol/L (ref 135–145)
Total Bilirubin: 1.1 mg/dL (ref 0.3–1.2)
Total Protein: 8.4 g/dL — ABNORMAL HIGH (ref 6.5–8.1)

## 2022-07-17 LAB — CBC WITH DIFFERENTIAL/PLATELET
Abs Immature Granulocytes: 0.05 10*3/uL (ref 0.00–0.07)
Basophils Absolute: 0 10*3/uL (ref 0.0–0.1)
Basophils Relative: 0 %
Eosinophils Absolute: 0 10*3/uL (ref 0.0–0.5)
Eosinophils Relative: 0 %
HCT: 47.9 % — ABNORMAL HIGH (ref 36.0–46.0)
Hemoglobin: 16.2 g/dL — ABNORMAL HIGH (ref 12.0–15.0)
Immature Granulocytes: 1 %
Lymphocytes Relative: 15 %
Lymphs Abs: 1.6 10*3/uL (ref 0.7–4.0)
MCH: 29.9 pg (ref 26.0–34.0)
MCHC: 33.8 g/dL (ref 30.0–36.0)
MCV: 88.4 fL (ref 80.0–100.0)
Monocytes Absolute: 0.6 10*3/uL (ref 0.1–1.0)
Monocytes Relative: 5 %
Neutro Abs: 8.5 10*3/uL — ABNORMAL HIGH (ref 1.7–7.7)
Neutrophils Relative %: 79 %
Platelets: 362 10*3/uL (ref 150–400)
RBC: 5.42 MIL/uL — ABNORMAL HIGH (ref 3.87–5.11)
RDW: 12.6 % (ref 11.5–15.5)
WBC: 10.7 10*3/uL — ABNORMAL HIGH (ref 4.0–10.5)
nRBC: 0 % (ref 0.0–0.2)

## 2022-07-17 LAB — RESP PANEL BY RT-PCR (FLU A&B, COVID) ARPGX2
Influenza A by PCR: NEGATIVE
Influenza B by PCR: NEGATIVE
SARS Coronavirus 2 by RT PCR: NEGATIVE

## 2022-07-17 LAB — TROPONIN I (HIGH SENSITIVITY)
Troponin I (High Sensitivity): 15 ng/L (ref <18)
Troponin I (High Sensitivity): 17 ng/L (ref <18)

## 2022-07-17 LAB — LIPASE, BLOOD: Lipase: 42 U/L (ref 11–51)

## 2022-07-17 NOTE — ED Triage Notes (Signed)
Patient reports intermittent emesis for 3 days , no diarrhea or fever , denies abdominal pain .

## 2022-07-17 NOTE — ED Provider Triage Note (Signed)
Emergency Medicine Provider Triage Evaluation Note  Katie [name] , a 76 y.o. female  was evaluated in triage.  Pt complains of nausea and vomiting for three days. Associated upper abdominal pain. She states she feels so tired and  like she is going to die. She is getting winded very easily. Last BM was two days ago. Denies diarrhea. No chest pain  Review of Systems  Positive:  Negative:   Physical Exam  BP (!) 144/107 (BP Location: Right Arm)   Pulse (!) 113   Temp 98.2 F (36.8 C) (Oral)   Resp 18   SpO2 99%  Gen:   Awake, no distress   Resp:  Normal effort  MSK:   Moves extremities without difficulty  Other:  Abdomen nondistended. Upper abdominal pain reproducible.   Medical Decision Making  Medically screening exam initiated at 7:24 PM.  Appropriate orders placed.  Seth Friedlander was informed that the remainder of the evaluation will be completed by another provider, this initial triage assessment does not replace that evaluation, and the importance of remaining in the ED until their evaluation is complete.     Adolphus Birchwood, [place] 07/17/22 1925

## 2022-07-18 ENCOUNTER — Emergency Department (HOSPITAL_COMMUNITY)
Admission: EM | Admit: 2022-07-18 | Discharge: 2022-07-19 | Disposition: A | Discharge status: Home / Self Care | Payer: Medicare Other | Source: Home / Self Care | Attending: Emergency Medicine | Admitting: Emergency Medicine

## 2022-07-18 DIAGNOSIS — D72829 Elevated white blood cell count, unspecified: Secondary | ICD-10-CM | POA: Insufficient documentation

## 2022-07-18 DIAGNOSIS — Z79899 Other long term (current) drug therapy: Secondary | ICD-10-CM | POA: Insufficient documentation

## 2022-07-18 DIAGNOSIS — I509 Heart failure, unspecified: Secondary | ICD-10-CM | POA: Insufficient documentation

## 2022-07-18 DIAGNOSIS — R000 Tachycardia, unspecified: Secondary | ICD-10-CM | POA: Insufficient documentation

## 2022-07-18 DIAGNOSIS — R112 Nausea with vomiting, unspecified: Secondary | ICD-10-CM | POA: Insufficient documentation

## 2022-07-18 DIAGNOSIS — I110 Hypertensive heart disease with heart failure: Secondary | ICD-10-CM | POA: Insufficient documentation

## 2022-07-18 DIAGNOSIS — Z7982 Long term (current) use of aspirin: Secondary | ICD-10-CM | POA: Insufficient documentation

## 2022-07-18 DIAGNOSIS — J449 Chronic obstructive pulmonary disease, unspecified: Secondary | ICD-10-CM | POA: Insufficient documentation

## 2022-07-18 DIAGNOSIS — N3000 Acute cystitis without hematuria: Secondary | ICD-10-CM

## 2022-07-18 LAB — CBC WITH DIFFERENTIAL/PLATELET
Abs Immature Granulocytes: 0.05 10*3/uL (ref 0.00–0.07)
Basophils Absolute: 0 10*3/uL (ref 0.0–0.1)
Basophils Relative: 0 %
Eosinophils Absolute: 0 10*3/uL (ref 0.0–0.5)
Eosinophils Relative: 0 %
HCT: 46.4 % — ABNORMAL HIGH (ref 36.0–46.0)
Hemoglobin: 15.8 g/dL — ABNORMAL HIGH (ref 12.0–15.0)
Immature Granulocytes: 0 %
Lymphocytes Relative: 19 %
Lymphs Abs: 2.1 10*3/uL (ref 0.7–4.0)
MCH: 29.7 pg (ref 26.0–34.0)
MCHC: 34.1 g/dL (ref 30.0–36.0)
MCV: 87.2 fL (ref 80.0–100.0)
Monocytes Absolute: 1 10*3/uL (ref 0.1–1.0)
Monocytes Relative: 9 %
Neutro Abs: 8.1 10*3/uL — ABNORMAL HIGH (ref 1.7–7.7)
Neutrophils Relative %: 72 %
Platelets: 334 10*3/uL (ref 150–400)
RBC: 5.32 MIL/uL — ABNORMAL HIGH (ref 3.87–5.11)
RDW: 12.5 % (ref 11.5–15.5)
WBC: 11.3 10*3/uL — ABNORMAL HIGH (ref 4.0–10.5)
nRBC: 0 % (ref 0.0–0.2)

## 2022-07-18 LAB — COMPREHENSIVE METABOLIC PANEL
ALT: 25 U/L (ref 0–44)
AST: 29 U/L (ref 15–41)
Albumin: 4.2 g/dL (ref 3.5–5.0)
Alkaline Phosphatase: 73 U/L (ref 38–126)
Anion gap: 15 (ref 5–15)
BUN: 47 mg/dL — ABNORMAL HIGH (ref 8–23)
CO2: 26 mmol/L (ref 22–32)
Calcium: 10.4 mg/dL — ABNORMAL HIGH (ref 8.9–10.3)
Chloride: 91 mmol/L — ABNORMAL LOW (ref 98–111)
Creatinine, Ser: 1.78 mg/dL — ABNORMAL HIGH (ref 0.44–1.00)
GFR, Estimated: 30 mL/min — ABNORMAL LOW (ref >60)
Glucose, Bld: 116 mg/dL — ABNORMAL HIGH (ref 70–99)
Potassium: 3.6 mmol/L (ref 3.5–5.1)
Sodium: 132 mmol/L — ABNORMAL LOW (ref 135–145)
Total Bilirubin: 1.3 mg/dL — ABNORMAL HIGH (ref 0.3–1.2)
Total Protein: 7.6 g/dL (ref 6.5–8.1)

## 2022-07-18 LAB — URINALYSIS, ROUTINE W REFLEX MICROSCOPIC
Bilirubin Urine: NEGATIVE
Glucose, UA: NEGATIVE mg/dL
Ketones, ur: 5 mg/dL — AB
Nitrite: NEGATIVE
Protein, ur: 30 mg/dL — AB
Specific Gravity, Urine: 1.021 (ref 1.005–1.030)
pH: 5 (ref 5.0–8.0)

## 2022-07-18 LAB — LIPASE, BLOOD: Lipase: 51 U/L (ref 11–51)

## 2022-07-18 NOTE — ED Notes (Signed)
Patient states the wait is long and she is leaving 

## 2022-07-18 NOTE — ED Triage Notes (Addendum)
Pt here via [facility] from doctors office for N/V since Tuesday. Pt was here yesterday and then left AMA. Per primary care, they are concerned about renal failure from labs drawn yesterday. 130/80, 120HR, 100% RA, cbg 138. Pt reports hx of renal failure and only has 1 kidney

## 2022-07-18 NOTE — ED Provider Triage Note (Signed)
Emergency Medicine Provider Triage Evaluation Note  Katie [name] , a 76 y.o. female  was evaluated in triage.  Pt complains of nausea and vomiting.  Seen yesterday in evaluation, left due to wait time.  Continues to have nausea and vomiting, follow-up with PCP today, noted to have acute kidney injury and sent back. Review of Systems  Positive: Vomiting Negative: diarrhea  Physical Exam  There were no vitals taken for this visit. Gen:   Awake, no distress   Resp:  Normal effort  MSK:   Moves extremities without difficulty  Other:  Epigastric pain  Medical Decision Making  Medically screening exam initiated at 4:21 PM.  Appropriate orders placed.  Katie [name] was informed that the remainder of the evaluation will be completed by another provider, this initial triage assessment does not replace that evaluation, and the importance of remaining in the ED until their evaluation is complete.  Work up initiated   Margarita Mail, PA-C 07/19/22 1437

## 2022-07-19 ENCOUNTER — Encounter (HOSPITAL_COMMUNITY): Payer: Self-pay

## 2022-07-19 MED ORDER — CEPHALEXIN 500 MG PO CAPS: 500.0000 mg | ORAL_CAPSULE | Freq: Three times a day (TID) | ORAL | 0 refills | Status: AC

## 2022-07-19 MED ORDER — SODIUM CHLORIDE 0.9 % IV BOLUS
1000.0000 mL | Freq: Once | INTRAVENOUS | Status: AC
  Administered 2022-07-19: 1000 mL via INTRAVENOUS

## 2022-07-19 MED ORDER — SODIUM CHLORIDE 0.9 % IV SOLN
1.0000 g | Freq: Once | INTRAVENOUS | Status: AC
  Administered 2022-07-19: 1 g via INTRAVENOUS
  Filled 2022-07-19: qty 10

## 2022-07-19 MED ORDER — ONDANSETRON HCL 4 MG/2ML IJ SOLN
4.0000 mg | Freq: Once | INTRAMUSCULAR | Status: DC
  Filled 2022-07-19: qty 2

## 2022-07-19 MED ORDER — METOCLOPRAMIDE HCL 5 MG/ML IJ SOLN
10.0000 mg | Freq: Once | INTRAMUSCULAR | Status: AC
  Administered 2022-07-19: 10 mg via INTRAVENOUS
  Filled 2022-07-19: qty 2

## 2022-07-19 NOTE — Discharge Instructions (Addendum)
Continue your antacid medications and your Reglan for nausea.  Follow-up with your primary care and GI doctor for further evaluation.  Return as needed for worsening symptoms.  Take the abx as prescribed for possible UTI

## 2022-07-19 NOTE — ED Provider Notes (Signed)
[facility] EMERGENCY DEPARTMENT Provider Note   CSN: [id number] Arrival date & time: 07/18/22  1618     History  Chief Complaint  Patient presents with   Abdominal Pain   Nausea    Katie [name] is a 76 y.o. female.   Abdominal Pain    Patient has history of COPD, hypertension, CHF, chronic tobacco use, esophageal reflux, status post cholecystectomy and nephrectomy, atrial fibrillation, abdominal aortic aneurysm, intractable nausea and vomiting who presents to the ED for evaluation of recurrent nausea and vomiting.  Patient states she has had the symptoms now for several months.  She has persistent nausea vomiting that occurs at any time not just with eating or drinking.  This most recent exacerbation started again in Tuesday.  She came to the ED for evaluation but left because of the weight.  Patient states she has been losing weight.  She is not having any pain.  She is not having any fevers.  She was referred to GI but is still waiting for an appointment.  She followed up with her doctor this week who pulled her and she needed IV fluids.  She came back to the ED last evening.  She denies any diarrhea.  No dysuria  Home Medications Prior to Admission medications   Medication Sig Start Date End Date Taking? Authorizing Provider  acetaminophen (TYLENOL) 500 MG tablet Take 1,000 mg by mouth every 8 (eight) hours as needed for mild pain.    [provider]  albuterol (VENTOLIN HFA) 108 (90 Base) MCG/ACT inhaler Inhale 2 puffs into the lungs every 6 (six) hours as needed for wheezing or shortness of breath. 02/08/20   Brunetta Jeans, PA-C  aspirin EC 81 MG tablet Take 1 tablet (81 mg total) by mouth daily. 09/29/18   Bonnielee Haff, MD  ezetimibe (ZETIA) 10 MG tablet Take 10 mg by mouth daily. 06/11/22   [provider]  losartan (COZAAR) 50 MG tablet Take 50 mg by mouth daily.    [provider]  metoCLOPramide (REGLAN) 10 MG tablet Take  1 tablet (10 mg total) by mouth 4 (four) times daily -  before meals and at bedtime. 06/20/22   Mapp, Claudia Desanctis, MD  metoprolol tartrate (LOPRESSOR) 25 MG tablet Take 1 tablet (25 mg total) by mouth 2 (two) times daily. 04/23/21   Kennyth Arnold, FNP  pantoprazole (PROTONIX) 40 MG tablet Take 1 tablet (40 mg total) by mouth daily. 03/14/22   Jackquline Denmark, MD  rosuvastatin (CRESTOR) 40 MG tablet Take 40 mg by mouth daily. 06/11/22   [provider]  sertraline (ZOLOFT) 50 MG tablet Take 50 mg by mouth daily. 05/26/22   [provider]  sucralfate (CARAFATE) 1 GM/10ML suspension Take 10 mLs (1 g total) by mouth 4 (four) times daily -  with meals and at bedtime. 06/20/22   Mapp, Claudia Desanctis, MD  TRELEGY ELLIPTA 200-62.5-25 MCG/INH AEPB Inhale 1 puff into the lungs daily. 07/17/21   [provider]      Allergies    Patient has no known allergies.    Review of Systems   Review of Systems  Gastrointestinal:  Positive for abdominal pain.    Physical Exam Updated Vital Signs BP 106/76   Pulse 81   Temp (!) 97.5 F (36.4 C) (Oral)   Resp 18   Ht 1.549 m ('5\' 1"'$ )   Wt 46.3 kg   SpO2 98%   BMI 19.27 kg/m  Physical Exam Vitals and nursing  note reviewed.  Constitutional:      Appearance: She is well-developed. She is not diaphoretic.  HENT:     Head: Normocephalic and atraumatic.     Right Ear: External ear normal.     Left Ear: External ear normal.  Eyes:     General: No scleral icterus.       Right eye: No discharge.        Left eye: No discharge.     Conjunctiva/sclera: Conjunctivae normal.  Neck:     Trachea: No tracheal deviation.  Cardiovascular:     Rate and Rhythm: Regular rhythm. Tachycardia present.  Pulmonary:     Effort: Pulmonary effort is normal. No respiratory distress.     Breath sounds: Normal breath sounds. No stridor. No wheezing or rales.  Abdominal:     General: Bowel sounds are normal. There is no distension.     Palpations: Abdomen is soft.      Tenderness: There is no abdominal tenderness. There is no guarding or rebound.  Musculoskeletal:        General: No tenderness or deformity.     Cervical back: Neck supple.  Skin:    General: Skin is warm and dry.     Findings: No rash.  Neurological:     General: No focal deficit present.     Mental Status: She is alert.     Cranial Nerves: No cranial nerve deficit (no facial droop, extraocular movements intact, no slurred speech).     Sensory: No sensory deficit.     Motor: No abnormal muscle tone or seizure activity.     Coordination: Coordination normal.  Psychiatric:        Mood and Affect: Mood normal.     ED Results / Procedures / Treatments   Labs (all labs ordered are listed, but only abnormal results are displayed) Labs Reviewed  CBC WITH DIFFERENTIAL/PLATELET - Abnormal; Notable for the following components:      Result Value   WBC 11.3 (*)    RBC 5.32 (*)    Hemoglobin 15.8 (*)    HCT 46.4 (*)    Neutro Abs 8.1 (*)    All other components within normal limits  COMPREHENSIVE METABOLIC PANEL - Abnormal; Notable for the following components:   Sodium 132 (*)    Chloride 91 (*)    Glucose, Bld 116 (*)    BUN 47 (*)    Creatinine, Ser 1.78 (*)    Calcium 10.4 (*)    Total Bilirubin 1.3 (*)    GFR, Estimated 30 (*)    All other components within normal limits  URINALYSIS, ROUTINE W REFLEX MICROSCOPIC - Abnormal; Notable for the following components:   APPearance HAZY (*)    Hgb urine dipstick SMALL (*)    Ketones, ur 5 (*)    Protein, ur 30 (*)    Leukocytes,Ua LARGE (*)    Bacteria, UA RARE (*)    All other components within normal limits  LIPASE, BLOOD  RAPID URINE DRUG SCREEN, HOSP PERFORMED    EKG EKG Interpretation  Date/Time:  Saturday July 19 2022 09:19:41 EDT Ventricular Rate:  85 PR Interval:  138 QRS Duration: 86 QT Interval:  418 QTC Calculation: 497 R Axis:   -5 Text Interpretation: Sinus rhythm with Premature atrial complexes  Minimal voltage criteria for LVH, may be normal variant ( Cornell product ) Prolonged QT Abnormal ECG When compared with ECG of 17-Jul-2022 19:24, Since last tracing rate slower Confirmed by Tomi Bamberger,  Wille Glaser [phone]) on 07/19/2022 9:28:20 AM  Radiology CT ABDOMEN PELVIS WO CONTRAST  Result Date: 07/17/2022 CLINICAL DATA:  Generalized upper abdominal pain with nausea and vomiting EXAM: CT ABDOMEN AND PELVIS WITHOUT CONTRAST TECHNIQUE: Multidetector CT imaging of the abdomen and pelvis was performed following the standard protocol without IV contrast. RADIATION DOSE REDUCTION: This exam was performed according to the departmental dose-optimization program which includes automated exposure control, adjustment of the mA and/or kV according to patient size and/or use of iterative reconstruction technique. COMPARISON:  CT abdomen and pelvis 05/06/2022 FINDINGS: Lower chest: No acute abnormality. Hepatobiliary: Stable hepatic cysts not requiring follow-up. Cholecystectomy. No biliary ductal dilation. Pancreas: Unremarkable. No pancreatic ductal dilatation or surrounding inflammatory changes. Spleen: Normal in size without focal abnormality. Adrenals/Urinary Tract: Status post right nephrectomy and adrenalectomy. Unremarkable noncontrast appearance of the left kidney and bladder. Stomach/Bowel: Stomach is within normal limits. No evidence of bowel wall thickening, distention, or inflammatory changes. The appendix is not visualized. Colonic diverticulosis without diverticulitis. Vascular/Lymphatic: Prior endovascular repair for aortic aneurysm with aorto bi-iliac graft. Aortic atherosclerotic calcification. No new or enlarging abdominopelvic lymph nodes. Stable prominence of a few retrocrural lymph nodes. Reproductive: Uterus and bilateral adnexa are unremarkable. Other: No free intraperitoneal fluid or air. Musculoskeletal: Chronic compression deformity of L3. No acute osseous abnormality. IMPRESSION: No acute abnormality in  the abdomen or pelvis. Electronically Signed   By: Placido Sou M.D.   On: 07/17/2022 22:22   DG Chest 2 View  Result Date: 07/17/2022 CLINICAL DATA:  Chest pain EXAM: CHEST - 2 VIEW COMPARISON:  06/19/2022 FINDINGS: Heart and mediastinal contours are within normal limits. No focal opacities or effusions. No acute bony abnormality. IMPRESSION: No active cardiopulmonary disease. Electronically Signed   By: Rolm Baptise M.D.   On: 07/17/2022 20:40    Procedures Procedures    Medications Ordered in ED Medications  sodium chloride 0.9 % bolus 1,000 mL (0 mLs Intravenous Stopped 07/19/22 0923)  cefTRIAXone (ROCEPHIN) 1 g in sodium chloride 0.9 % 100 mL IVPB (0 g Intravenous Stopped 07/19/22 0859)  metoCLOPramide (REGLAN) injection 10 mg (10 mg Intravenous Given 07/19/22 0816)    ED Course/ Medical Decision Making/ A&P Clinical Course as of 07/19/22 0943  Sat Jul 19, 2022  0720 CBC with Differential(!) White blood cell count elevated 11.3 [JK]  0725 Urinalysis, Routine w reflex microscopic Urine, Clean Catch(!) Urinalysis shows large leukocyte esterase white blood cells and rare bacteria suggest possible infection [JK]  0726 Lipase, blood Normal [JK]  0749 Comprehensive metabolic panel(!) Creatinine elevated but decreased from 2 days ago, similar to baseline from a couple months ago [JK]  0750 Reviewed CT scan report from Septer 28th.  No acute findings noted [JK]  0751 Reviewed GI notes from June 14.  Recommendation was for an EGD.  Patient was started on Protonix.  They also recommended Reglan and repeat UDS of her symptoms persisted [JK]  0752 Outpatient notes indicate a telephone encounter on July 24.  Patient was.  Follow-up with GI on August 3.  I do not see any indication of a visit in the outpatient record [JK]    Clinical Course User Index [JK] Dorie Rank, MD                           Medical Decision Making Problems Addressed: Nausea and vomiting, unspecified vomiting type:  chronic illness or injury with exacerbation, progression, or side effects of treatment  Amount and/or Complexity  of Data Reviewed External Data Reviewed: radiology and notes.    Details: Reviewed GI notes.  Recent CT scan and laboratory tests Labs: ordered. Decision-making details documented in ED Course.  Risk Prescription drug management.   Patient presented with persistent nausea and vomiting.  Patient has had recurrent symptoms for several months now.  She has been referred to GI.  Patient has been started on medications unfortunately symptoms persist.  Patient states she is waiting for follow-up appointment.  No recent GI visits in the EMR although there are telephone encounter notes indicating a follow-up appointment scheduled for August 3.  I do not see any notes in the medical record on that day.  Patient's ED work-up fortunately shows improvement of her electrolyte panel since her visit to the ED earlier this week.  Patient is not having abdominal pain.  She had previous outpatient CT scanning.  I do not feel that additional imaging is necessary.  No signs of acute infection.  Patient was treated with a liter of IV fluids.  She was given antinausea medications.  Recommend continuation of her outpatient regimen and close follow-up with GI and PCP.  Evaluation and diagnostic testing in the emergency department does not suggest an emergent condition requiring admission or immediate intervention beyond what has been performed at this time.  The patient is safe for discharge and has been instructed to return immediately for worsening symptoms, change in symptoms or any other concerns.       Final Clinical Impression(s) / ED Diagnoses Final diagnoses:  Nausea and vomiting, unspecified vomiting type    Rx / DC Orders ED Discharge Orders     None         Dorie Rank, MD 07/19/22 [phone]

## 2022-08-18 ENCOUNTER — Other Ambulatory Visit: Payer: Medicare Other

## 2022-08-18 ENCOUNTER — Encounter: Payer: Self-pay | Admitting: Gastroenterology

## 2022-08-18 ENCOUNTER — Ambulatory Visit: Payer: Medicare Other | Admitting: Gastroenterology

## 2022-08-18 VITALS — BP 118/86 | HR 65 | Ht 61.0 in | Wt 118.4 lb

## 2022-08-18 DIAGNOSIS — K219 Gastro-esophageal reflux disease without esophagitis: Secondary | ICD-10-CM | POA: Diagnosis not present

## 2022-08-18 DIAGNOSIS — R1013 Epigastric pain: Secondary | ICD-10-CM

## 2022-08-18 DIAGNOSIS — R112 Nausea with vomiting, unspecified: Secondary | ICD-10-CM

## 2022-08-18 MED ORDER — PANTOPRAZOLE SODIUM 40 MG PO TBEC: 40.0000 mg | DELAYED_RELEASE_TABLET | Freq: Every day | ORAL | 5 refills | Status: DC

## 2022-08-18 NOTE — Progress Notes (Signed)
08/18/2022 Katie [name] [id number] 1946/10/11   HISTORY OF PRESENT ILLNESS: This is a 76 year old female who is a patient of Dr. Steve Rattler.  She is being evaluated for complaints of epigastric abdominal pain and persistent episodes of nausea and vomiting over the past several months.  This has resulted in about a 25 pound weight loss.  She has past medical history of a AAA that was repaired in March, history of renal cell carcinoma status post nephrectomy, cholecystectomy, COPD due to longstanding smoking, hypertension, hyperlipidemia, GERD.  She has had 8 CT scans of the abdomen and pelvis since March that have shown no source of her symptoms.  She was seen by our service in the hospital in early September and was placed on Reglan and responded to that.  She is here today for follow-up.  She is currently not on any of her medications right now except for taking her blood pressure medicine as needed when she feels dizzy.  Still has epigastric abdominal pain and nausea with episodes of vomiting.  EGD 03/2022:  - LA Grade A reflux esophagitis with no bleeding. Biopsied. - Mild gastritis. Biopsied. - Normal examined duodenum. Biopsied.  1. Surgical [P], duodenum - DUODENAL MUCOSA WITH NO SPECIFIC PATHOLOGIC DIAGNOSIS. - NORMAL LONG AND SLENDER VILLI, NEGATIVE FOR PATHOLOGIC INTRAEPITHELIAL LYMPHOCYTOSIS. 2. Surgical [P], gastric antrum and gastric body minimal gastritis - GASTRIC OXYNTOMUCINOUS AND OXYNTIC MUCOSA WITH NO SPECIFIC PATHOLOGIC DIAGNOSIS. - NEGATIVE FOR AN INFLAMMATORY PATTERN PREDICTIVE OF HELICOBACTER PYLORI INFECTION. - NEGATIVE FOR INTESTINAL METAPLASIA AND MALIGNANCY. 3. Surgical [P], distal esophagus-esophagitis - REACTIVE SQUAMOCOLUMNAR MUCOSA WITH FEATURES CONSISTENT WITH REFLUX EFFECT (BASAL HYPERPLASIA, ELONGATED PAPILLAE, PARAKERATOSIS). - NEGATIVE FOR INTESTINAL METAPLASIA. - ACUTE INFLAMMATORY CELL ULCER EXUDATE. - NEGATIVE FOR MALIGNANCY. - NO VIRAL CYTOPATHIC  CHANGE IDENTIFIED. - GMS STAIN FOR FUNGI PENDING, TO BE REPORTED IN AN ADDENDUM.    Past Medical History:  Diagnosis Date   AAA (abdominal aortic aneurysm) ([place])    Arthritis    Cholelithiasis 2011   s/p cholescystectomy    Chronic diastolic congestive heart failure ([facility])    COPD (chronic obstructive pulmonary disease) ([facility])    GERD (gastroesophageal reflux disease)    History of renal cell carcinoma    Hypercholesteremia    Hypertension    Hypertension    Mitral regurgitation    Pneumonia 1980's   Renal cell carcinoma 01/2010   s/p right radical nephrectomy 01/2010,  followed by [facility]   S/P minimally invasive mitral valve repair 04/20/2019   Complex valvuloplasty including triangular resection of flail segment of posterior leaflet, artificial Gore-tex neochord placement x6 and 30 mm Sorin Memo 4D ring annuloplasty via right mini thoracotomy approach   Shortness of breath    Tuberculosis    Tests positive for PPD. dad had h/o TB.   Past Surgical History:  Procedure Laterality Date   ABDOMINAL AORTIC ENDOVASCULAR STENT GRAFT N/A 01/10/2022   Procedure: ABDOMINAL AORTIC ENDOVASCULAR STENT GRAFT;  Surgeon: Marty Heck, MD;  Location: [place];  Service: Vascular;  Laterality: N/A;   APPENDECTOMY  1970's   BUBBLE STUDY  03/18/2019   Procedure: BUBBLE STUDY;  Surgeon: Fay Records, MD;  Location: [place];  Service: Cardiovascular;;   CARDIAC CATHETERIZATION  09/2011   CHOLECYSTECTOMY  01/2010   DIAGNOSTIC LAPAROSCOPY     KIDNEY SURGERY     LEFT AND RIGHT HEART CATHETERIZATION WITH CORONARY ANGIOGRAM N/A 09/30/2011   Procedure: LEFT AND RIGHT HEART CATHETERIZATION WITH CORONARY ANGIOGRAM;  Surgeon:  Candee Furbish, MD;  Location: [facility] CATH LAB;  Service: Cardiovascular;  Laterality: N/A;   MITRAL VALVE REPAIR Right 04/20/2019   Procedure: MINIMALLY INVASIVE MITRAL VALVE REPAIR (MVR) USING MEMO 4D SIZE 30;  Surgeon: Rexene Alberts, MD;  Location: [place];  Service:  Open Heart Surgery;  Laterality: Right;   MULTIPLE EXTRACTIONS WITH ALVEOLOPLASTY  10/06/2011   Procedure: MULTIPLE EXTRACION WITH ALVEOLOPLASTY;  Surgeon: Lenn Cal, DDS;  Location: [place];  Service: Oral Surgery;  Laterality: N/A;  Multiple extraction of tooth #'s 1, 6, 8, 10, 11, 22, 23, 26, 27, 28, 29 with alveoloplasty and Upper right buccal exostoses reductions.   NEPHRECTOMY RADICAL  01/2010   right    ORIF WRIST FRACTURE Right 02/17/2022   Procedure: OPEN REDUCTION INTERNAL FIXATION (ORIF) WRIST FRACTURE;  Surgeon: Iran Planas, MD;  Location: [place];  Service: Orthopedics;  Laterality: Right;  with IV sedation   OVARIAN CYST REMOVAL  1970's   "went through belly button"   RIGHT/LEFT HEART CATH AND CORONARY ANGIOGRAPHY N/A 03/22/2019   Procedure: RIGHT/LEFT HEART CATH AND CORONARY ANGIOGRAPHY;  Surgeon: [name], MD;  Location: [place] CV LAB;  Service: Cardiovascular;  Laterality: N/A;   TEE WITHOUT CARDIOVERSION  10/01/2011   Procedure: TRANSESOPHAGEAL ECHOCARDIOGRAM (TEE);  Surgeon: [name];  Location: [place];  Service: Cardiovascular;  Laterality: N/A;   TEE WITHOUT CARDIOVERSION N/A 03/18/2019   Procedure: TRANSESOPHAGEAL ECHOCARDIOGRAM (TEE);  Surgeon: Fay Records, MD;  Location: [place];  Service: Cardiovascular;  Laterality: N/A;   TEE WITHOUT CARDIOVERSION N/A 04/20/2019   Procedure: TRANSESOPHAGEAL ECHOCARDIOGRAM (TEE);  Surgeon: Rexene Alberts, MD;  Location: [place];  Service: Open Heart Surgery;  Laterality: N/A;   TUBAL LIGATION  1970's   US ECHOCARDIOGRAPHY  09/2011    reports that she has been smoking cigarettes. She has a 26.50 pack-year smoking history. She has been exposed to tobacco smoke. She has never used smokeless tobacco. She reports that she does not currently use alcohol. She reports that she does not currently use drugs after having used the following drugs: Cocaine. family history includes COPD in her mother; Diabetes  in her brother and brother. No Known Allergies    Outpatient Encounter Medications as of 08/18/2022  Medication Sig   acetaminophen (TYLENOL) 500 MG tablet Take 1,000 mg by mouth every 8 (eight) hours as needed for mild pain. (Patient not taking: Reported on 08/18/2022)   albuterol (VENTOLIN HFA) 108 (90 Base) MCG/ACT inhaler Inhale 2 puffs into the lungs every 6 (six) hours as needed for wheezing or shortness of breath. (Patient not taking: Reported on 08/18/2022)   aspirin EC 81 MG tablet Take 1 tablet (81 mg total) by mouth daily. (Patient not taking: Reported on 08/18/2022)   ezetimibe (ZETIA) 10 MG tablet Take 10 mg by mouth daily. (Patient not taking: Reported on 08/18/2022)   losartan (COZAAR) 50 MG tablet Take 50 mg by mouth daily. (Patient not taking: Reported on 08/18/2022)   metoCLOPramide (REGLAN) 10 MG tablet Take 1 tablet (10 mg total) by mouth 4 (four) times daily -  before meals and at bedtime. (Patient not taking: Reported on 08/18/2022)   metoprolol tartrate (LOPRESSOR) 25 MG tablet Take 1 tablet (25 mg total) by mouth 2 (two) times daily. (Patient not taking: Reported on 08/18/2022)   pantoprazole (PROTONIX) 40 MG tablet Take 1 tablet (40 mg total) by mouth daily.   rosuvastatin (CRESTOR) 40 MG tablet Take 40 mg by mouth daily. (Patient  not taking: Reported on 08/18/2022)   sertraline (ZOLOFT) 50 MG tablet Take 50 mg by mouth daily. (Patient not taking: Reported on 08/18/2022)   sucralfate (CARAFATE) 1 GM/10ML suspension Take 10 mLs (1 g total) by mouth 4 (four) times daily -  with meals and at bedtime. (Patient not taking: Reported on 08/18/2022)   TRELEGY ELLIPTA 200-62.5-25 MCG/INH AEPB Inhale 1 puff into the lungs daily. (Patient not taking: Reported on 08/18/2022)   [DISCONTINUED] pantoprazole (PROTONIX) 40 MG tablet Take 1 tablet (40 mg total) by mouth daily. (Patient not taking: Reported on 08/18/2022)   No facility-administered encounter medications on file as of  08/18/2022.     REVIEW OF SYSTEMS  : All other systems reviewed and negative except where noted in the History of Present Illness.   PHYSICAL EXAM: BP 118/86   Pulse 65   Ht '5\' 1"'$  (1.549 m)   Wt 118 lb 6 oz (53.7 kg)   BMI 22.37 kg/m  General: Well developed female in no acute distress Head: Normocephalic and atraumatic Eyes:  Sclerae anicteric, conjunctiva pink. Ears: Normal auditory acuity Lungs: Clear throughout to auscultation; some wheezing noted. Heart: Regular rate and rhythm; no M/R/G. Abdomen: Soft, non-distended.  BS present.  Epigastric TTP. Musculoskeletal: Symmetrical with no gross deformities  Skin: No lesions on visible extremities Extremities: No edema  Neurological: Alert oriented x 4, grossly non-focal Psychological:  Alert and cooperative. Normal mood and affect  ASSESSMENT AND PLAN: *76 year old female with constant, but intermittently worsening mid to upper abdominal pain since March.  This has been associated with about a 25 pound weight loss and nausea with episodes of vomiting.  She does not have a gallbladder.  She has had 8 CT scans since March that have not shown any cause of her pain.  She also had an EGD which just showed some rather mild esophagitis.  She does have some vascular issues, but celiac and SMA were patent on previous imaging studies.  Does not seem that this is functional as she has never had this in the past, does not have any history of anxiety, etc.  She just had a AAA repair in March, but says that her symptoms were present prior to that.  Responded to Reglan in the hospital.  Is no longer on that or any of her medications.   -Resume pantoprazole 40 mg daily.  Prescription sent to pharmacy. -We will check gastric emptying scan.  Will need to hold the pantoprazole for 48 hours prior to this. -We will check a pancreatic fecal elastase. -We will check an a.m. fasting cortisol level. -Consider CT of the head specifically for the blood  complaints of nausea and vomiting.   CC:  Roselee Nova, MD

## 2022-08-18 NOTE — Patient Instructions (Addendum)
Your provider has requested that you go to the basement level for lab work before leaving today. Press "B" on the elevator. The lab is located at the first door on the left as you exit the elevator.  Please have fasting Cortisol lab when you bring stool sample back.   Please restart your Pantoprazole 40 mg once daily.    You have been scheduled for a gastric emptying scan at [facility] on 09/03/22 at 7:30 am . Please arrive at least  37mnutes prior to your appointment for registration. Please make certain not to have anything to eat or drink after midnight the night before your test. Hold all stomach medications (ex: Zofran, phenergan, Reglan) 48 hours prior to your test. If you need to reschedule your appointment, please contact radiology scheduling at [phone] _____________________________________________________________________ A gastric-emptying study measures how long it takes for food to move through your stomach. There are several ways to measure stomach emptying. In the most common test, you eat food that contains a small amount of radioactive material. A scanner that detects the movement of the radioactive material is placed over your abdomen to monitor the rate at which food leaves your stomach. This test normally takes about 4 hours to complete. _____________________________________________________________________  _______________________________________________________  If you are age [age]or older, your body mass index should be between 23-30. Your Body mass index is 22.37 kg/m. If this is out of the aforementioned range listed, please consider follow up with your Primary Care Provider.  If you are age 8965or younger, your body mass index should be between 19-25. Your Body mass index is 22.37 kg/m. If this is out of the aformentioned range listed, please consider follow up with your Primary Care Provider.   ________________________________________________________  The  [place] [facility] providers would like to encourage you to use [facility]to communicate with providers for non-urgent requests or questions.  Due to long hold times on the telephone, sending your provider a message by [facility] be a faster and more efficient way to get a response.  Please allow 48 business hours for a response.  Please remember that this is for non-urgent requests.  _______________________________________________________  Thank you for choosing me and [facility].  JAlonza BogusPA

## 2022-08-24 NOTE — Progress Notes (Signed)
Agree with assessment/plan.  Raj Aidric Endicott, MD [facility] [phone]  

## 2022-09-03 ENCOUNTER — Encounter (HOSPITAL_COMMUNITY)
Admission: RE | Admit: 2022-09-03 | Discharge: 2022-09-03 | Disposition: A | Discharge status: Home / Self Care | Payer: Medicare Other | Source: Ambulatory Visit | Attending: Gastroenterology | Admitting: Gastroenterology

## 2022-09-03 ENCOUNTER — Other Ambulatory Visit: Payer: Medicare Other

## 2022-09-03 DIAGNOSIS — R1013 Epigastric pain: Secondary | ICD-10-CM

## 2022-09-03 DIAGNOSIS — K219 Gastro-esophageal reflux disease without esophagitis: Secondary | ICD-10-CM

## 2022-09-03 DIAGNOSIS — R112 Nausea with vomiting, unspecified: Secondary | ICD-10-CM | POA: Insufficient documentation

## 2022-09-03 MED ORDER — TECHNETIUM TC 99M SULFUR COLLOID
2.2000 | Freq: Once | INTRAVENOUS | Status: AC
  Administered 2022-09-03: 2.2 via ORAL

## 2022-09-09 LAB — PANCREATIC ELASTASE, FECAL: Pancreatic Elastase-1, Stool: >500 mcg/g

## 2022-09-23 ENCOUNTER — Other Ambulatory Visit: Payer: Self-pay | Admitting: Family Medicine

## 2022-09-23 DIAGNOSIS — E2839 Other primary ovarian failure: Secondary | ICD-10-CM

## 2022-11-10 ENCOUNTER — Other Ambulatory Visit: Payer: Self-pay | Admitting: Family Medicine

## 2022-11-10 ENCOUNTER — Encounter: Payer: Self-pay | Admitting: Family Medicine

## 2022-11-10 DIAGNOSIS — R109 Unspecified abdominal pain: Secondary | ICD-10-CM

## 2022-11-18 ENCOUNTER — Ambulatory Visit (HOSPITAL_COMMUNITY): Payer: Medicare Other

## 2022-11-19 ENCOUNTER — Ambulatory Visit (HOSPITAL_COMMUNITY)
Admission: RE | Admit: 2022-11-19 | Discharge: 2022-11-19 | Disposition: A | Discharge status: Home / Self Care | Payer: Medicare Other | Source: Ambulatory Visit | Attending: Acute Care | Admitting: Acute Care

## 2022-11-19 DIAGNOSIS — F17200 Nicotine dependence, unspecified, uncomplicated: Secondary | ICD-10-CM | POA: Diagnosis present

## 2022-11-19 DIAGNOSIS — Z87891 Personal history of nicotine dependence: Secondary | ICD-10-CM | POA: Insufficient documentation

## 2022-11-19 DIAGNOSIS — F17210 Nicotine dependence, cigarettes, uncomplicated: Secondary | ICD-10-CM | POA: Diagnosis present

## 2022-11-20 ENCOUNTER — Other Ambulatory Visit: Payer: Self-pay | Admitting: Acute Care

## 2022-11-20 DIAGNOSIS — Z87891 Personal history of nicotine dependence: Secondary | ICD-10-CM

## 2022-11-20 DIAGNOSIS — F17210 Nicotine dependence, cigarettes, uncomplicated: Secondary | ICD-10-CM

## 2022-11-20 DIAGNOSIS — Z122 Encounter for screening for malignant neoplasm of respiratory organs: Secondary | ICD-10-CM

## 2022-11-23 ENCOUNTER — Emergency Department (HOSPITAL_BASED_OUTPATIENT_CLINIC_OR_DEPARTMENT_OTHER): Payer: Medicare Other

## 2022-11-23 ENCOUNTER — Other Ambulatory Visit: Payer: Self-pay

## 2022-11-23 ENCOUNTER — Encounter (HOSPITAL_BASED_OUTPATIENT_CLINIC_OR_DEPARTMENT_OTHER): Payer: Self-pay | Admitting: *Deleted

## 2022-11-23 ENCOUNTER — Emergency Department (HOSPITAL_BASED_OUTPATIENT_CLINIC_OR_DEPARTMENT_OTHER)
Admission: EM | Admit: 2022-11-23 | Discharge: 2022-11-23 | Disposition: A | Discharge status: Home / Self Care | Payer: Medicare Other | Attending: Emergency Medicine | Admitting: Emergency Medicine

## 2022-11-23 DIAGNOSIS — I5032 Chronic diastolic (congestive) heart failure: Secondary | ICD-10-CM | POA: Diagnosis not present

## 2022-11-23 DIAGNOSIS — F17200 Nicotine dependence, unspecified, uncomplicated: Secondary | ICD-10-CM | POA: Insufficient documentation

## 2022-11-23 DIAGNOSIS — Z7982 Long term (current) use of aspirin: Secondary | ICD-10-CM | POA: Insufficient documentation

## 2022-11-23 DIAGNOSIS — R1031 Right lower quadrant pain: Secondary | ICD-10-CM | POA: Diagnosis present

## 2022-11-23 DIAGNOSIS — R109 Unspecified abdominal pain: Secondary | ICD-10-CM

## 2022-11-23 DIAGNOSIS — Z79899 Other long term (current) drug therapy: Secondary | ICD-10-CM | POA: Insufficient documentation

## 2022-11-23 DIAGNOSIS — I130 Hypertensive heart and chronic kidney disease with heart failure and stage 1 through stage 4 chronic kidney disease, or unspecified chronic kidney disease: Secondary | ICD-10-CM | POA: Diagnosis not present

## 2022-11-23 DIAGNOSIS — N3000 Acute cystitis without hematuria: Secondary | ICD-10-CM | POA: Diagnosis not present

## 2022-11-23 DIAGNOSIS — J449 Chronic obstructive pulmonary disease, unspecified: Secondary | ICD-10-CM | POA: Insufficient documentation

## 2022-11-23 DIAGNOSIS — M545 Low back pain, unspecified: Secondary | ICD-10-CM

## 2022-11-23 DIAGNOSIS — N189 Chronic kidney disease, unspecified: Secondary | ICD-10-CM | POA: Insufficient documentation

## 2022-11-23 LAB — COMPREHENSIVE METABOLIC PANEL WITH GFR
ALT: 12 U/L (ref 0–44)
AST: 17 U/L (ref 15–41)
Albumin: 4.2 g/dL (ref 3.5–5.0)
Alkaline Phosphatase: 82 U/L (ref 38–126)
Anion gap: 13 (ref 5–15)
BUN: 32 mg/dL — ABNORMAL HIGH (ref 8–23)
CO2: 23 mmol/L (ref 22–32)
Calcium: 10.3 mg/dL (ref 8.9–10.3)
Chloride: 103 mmol/L (ref 98–111)
Creatinine, Ser: 1.67 mg/dL — ABNORMAL HIGH (ref 0.44–1.00)
GFR, Estimated: 32 mL/min — ABNORMAL LOW
Glucose, Bld: 100 mg/dL — ABNORMAL HIGH (ref 70–99)
Potassium: 4.4 mmol/L (ref 3.5–5.1)
Sodium: 139 mmol/L (ref 135–145)
Total Bilirubin: 0.3 mg/dL (ref 0.3–1.2)
Total Protein: 7.7 g/dL (ref 6.5–8.1)

## 2022-11-23 LAB — URINALYSIS, ROUTINE W REFLEX MICROSCOPIC
Bacteria, UA: NONE SEEN
Bilirubin Urine: NEGATIVE
Glucose, UA: NEGATIVE mg/dL
Hgb urine dipstick: NEGATIVE
Nitrite: NEGATIVE
Protein, ur: 30 mg/dL — AB
Specific Gravity, Urine: 1.035 — ABNORMAL HIGH (ref 1.005–1.030)
pH: 5.5 (ref 5.0–8.0)

## 2022-11-23 LAB — LIPASE, BLOOD: Lipase: 68 U/L — ABNORMAL HIGH (ref 11–51)

## 2022-11-23 LAB — CBC WITH DIFFERENTIAL/PLATELET
Abs Immature Granulocytes: 0.04 10*3/uL (ref 0.00–0.07)
Basophils Absolute: 0.1 10*3/uL (ref 0.0–0.1)
Basophils Relative: 1 %
Eosinophils Absolute: 0.1 10*3/uL (ref 0.0–0.5)
Eosinophils Relative: 1 %
HCT: 41.7 % (ref 36.0–46.0)
Hemoglobin: 14.1 g/dL (ref 12.0–15.0)
Immature Granulocytes: 0 %
Lymphocytes Relative: 17 %
Lymphs Abs: 1.7 10*3/uL (ref 0.7–4.0)
MCH: 29.9 pg (ref 26.0–34.0)
MCHC: 33.8 g/dL (ref 30.0–36.0)
MCV: 88.5 fL (ref 80.0–100.0)
Monocytes Absolute: 0.6 10*3/uL (ref 0.1–1.0)
Monocytes Relative: 6 %
Neutro Abs: 7.8 10*3/uL — ABNORMAL HIGH (ref 1.7–7.7)
Neutrophils Relative %: 75 %
Platelets: 333 10*3/uL (ref 150–400)
RBC: 4.71 MIL/uL (ref 3.87–5.11)
RDW: 12.8 % (ref 11.5–15.5)
WBC: 10.4 10*3/uL (ref 4.0–10.5)
nRBC: 0 % (ref 0.0–0.2)

## 2022-11-23 LAB — TROPONIN I (HIGH SENSITIVITY)
Troponin I (High Sensitivity): 7 ng/L (ref <18)
Troponin I (High Sensitivity): 7 ng/L

## 2022-11-23 MED ORDER — CEPHALEXIN 250 MG PO CAPS
500.0000 mg | ORAL_CAPSULE | Freq: Once | ORAL | Status: AC
  Administered 2022-11-23: 500 mg via ORAL
  Filled 2022-11-23: qty 2

## 2022-11-23 MED ORDER — SODIUM CHLORIDE 0.9 % IV BOLUS
500.0000 mL | Freq: Once | INTRAVENOUS | Status: AC
  Administered 2022-11-23: 500 mL via INTRAVENOUS

## 2022-11-23 MED ORDER — FENTANYL CITRATE PF 50 MCG/ML IJ SOSY
50.0000 ug | PREFILLED_SYRINGE | Freq: Once | INTRAMUSCULAR | Status: AC
  Administered 2022-11-23: 50 ug via INTRAVENOUS
  Filled 2022-11-23: qty 1

## 2022-11-23 MED ORDER — SODIUM CHLORIDE 0.9 % IV SOLN: INTRAVENOUS | Status: DC

## 2022-11-23 MED ORDER — CEPHALEXIN 500 MG PO CAPS: 500.0000 mg | ORAL_CAPSULE | Freq: Four times a day (QID) | ORAL | 0 refills | Status: DC

## 2022-11-23 MED ORDER — HYDROMORPHONE HCL 1 MG/ML IJ SOLN
1.0000 mg | Freq: Once | INTRAMUSCULAR | Status: AC
  Administered 2022-11-23: 1 mg via INTRAVENOUS
  Filled 2022-11-23: qty 1

## 2022-11-23 MED ORDER — ONDANSETRON HCL 4 MG/2ML IJ SOLN
4.0000 mg | Freq: Once | INTRAMUSCULAR | Status: AC
  Administered 2022-11-23: 4 mg via INTRAVENOUS
  Filled 2022-11-23: qty 2

## 2022-11-23 MED ORDER — OXYCODONE-ACETAMINOPHEN 5-325 MG PO TABS: 1.0000 | ORAL_TABLET | Freq: Four times a day (QID) | ORAL | 0 refills | Status: DC | PRN

## 2022-11-23 MED ORDER — MORPHINE SULFATE (PF) 2 MG/ML IV SOLN
2.0000 mg | Freq: Once | INTRAVENOUS | Status: AC
  Administered 2022-11-23: 2 mg via INTRAVENOUS
  Filled 2022-11-23: qty 1

## 2022-11-23 NOTE — ED Triage Notes (Addendum)
BIB sister from home for R flank pain, onset today, recurrent, chills, fatigue, general weakness, and R flank pain. Denies urinary sx, fever, NVD. No relief with tylenol and muscle relaxant. Writhing/ restless in pain.

## 2022-11-23 NOTE — ED Provider Notes (Addendum)
[facility] Provider Note   CSN: [id number] Arrival date & time: 11/23/22  1551     History  Chief Complaint  Patient presents with   Flank Pain    Katie [name] is a 77 y.o. female.  Patient with acute onset of right CVA right flank pain at 12 noon.  Patient was feeling before and the day as if her COPD was acting up.  Patient talks about chills fatigue generalized weakness the right flank pain no urinary tract symptoms no nausea vomiting or diarrhea.  Historically it sounds as if patient's had pain like this before with negative workups.  Patient denies any anterior chest pain.  Past medical history sniffer arthritis hypertension high cholesterol cholelithiasis renal cell carcinoma in 2011 had a right radical nephrectomy and followed by [facility].  Gastroesophageal reflux disease aorta abdominal aortic aneurysm COPD hypertension mitral control regurgitation chronic diastolic congestive heart failure.  Status post minimal invasive mitral valve repair.  Past surgical history significant for the nephrectomy in 2011 cholecystectomy in 2011 appendectomy in the 1970s.  Cardiac catheterization in 2012.  Repeat right heart cath in 2020 and abdominal aortic endovascular stent in 2023.  Patient is an everyday smoker.  Last attempt to quit was 2020.       Home Medications Prior to Admission medications   Medication Sig Start Date End Date Taking? Authorizing Provider  acetaminophen (TYLENOL) 500 MG tablet Take 1,000 mg by mouth every 8 (eight) hours as needed for mild pain. Patient not taking: Reported on 08/18/2022    [provider]  albuterol (VENTOLIN HFA) 108 (90 Base) MCG/ACT inhaler Inhale 2 puffs into the lungs every 6 (six) hours as needed for wheezing or shortness of breath. Patient not taking: Reported on 08/18/2022 02/08/20   Brunetta Jeans, PA-C  aspirin EC 81 MG tablet Take 1 tablet (81 mg total) by mouth  daily. Patient not taking: Reported on 08/18/2022 09/29/18   Bonnielee Haff, MD  ezetimibe (ZETIA) 10 MG tablet Take 10 mg by mouth daily. Patient not taking: Reported on 08/18/2022 06/11/22   [provider]  losartan (COZAAR) 50 MG tablet Take 50 mg by mouth daily. Patient not taking: Reported on 08/18/2022    [provider]  metoCLOPramide (REGLAN) 10 MG tablet Take 1 tablet (10 mg total) by mouth 4 (four) times daily -  before meals and at bedtime. Patient not taking: Reported on 08/18/2022 06/20/22   Starlyn Skeans, MD  metoprolol tartrate (LOPRESSOR) 25 MG tablet Take 1 tablet (25 mg total) by mouth 2 (two) times daily. Patient not taking: Reported on 08/18/2022 04/23/21   Kennyth Arnold, FNP  pantoprazole (PROTONIX) 40 MG tablet Take 1 tablet (40 mg total) by mouth daily. 08/18/22   Zehr, Laban Emperor, PA-C  rosuvastatin (CRESTOR) 40 MG tablet Take 40 mg by mouth daily. Patient not taking: Reported on 08/18/2022 06/11/22   [provider]  sertraline (ZOLOFT) 50 MG tablet Take 50 mg by mouth daily. Patient not taking: Reported on 08/18/2022 05/26/22   [provider]  sucralfate (CARAFATE) 1 GM/10ML suspension Take 10 mLs (1 g total) by mouth 4 (four) times daily -  with meals and at bedtime. Patient not taking: Reported on 08/18/2022 06/20/22   Starlyn Skeans, MD  TRELEGY ELLIPTA 200-62.5-25 MCG/INH AEPB Inhale 1 puff into the lungs daily. Patient not taking: Reported on 08/18/2022 07/17/21   [provider]      Allergies    Patient  has no known allergies.    Review of Systems   Review of Systems  Constitutional:  Negative for chills and fever.  HENT:  Negative for ear pain and sore throat.   Eyes:  Negative for pain and visual disturbance.  Respiratory:  Negative for cough and shortness of breath.   Cardiovascular:  Negative for chest pain and palpitations.  Gastrointestinal:  Positive for abdominal pain. Negative for vomiting.  Genitourinary:   Positive for flank pain. Negative for dysuria and hematuria.  Musculoskeletal:  Positive for back pain. Negative for arthralgias.  Skin:  Negative for color change and rash.  Neurological:  Negative for seizures and syncope.  All other systems reviewed and are negative.   Physical Exam Updated Vital Signs BP (!) 158/97   Pulse 75   Temp 98.6 F (37 C) (Oral)   Resp 15   Ht 1.549 m ('5\' 1"'$ )   Wt 53.1 kg   SpO2 98%   BMI 22.11 kg/m  Physical Exam Vitals and nursing note reviewed.  Constitutional:      General: She is in acute distress.     Appearance: Normal appearance. She is well-developed.  HENT:     Head: Normocephalic and atraumatic.  Eyes:     Extraocular Movements: Extraocular movements intact.     Conjunctiva/sclera: Conjunctivae normal.     Pupils: Pupils are equal, round, and reactive to light.  Cardiovascular:     Rate and Rhythm: Normal rate and regular rhythm.     Heart sounds: No murmur heard. Pulmonary:     Effort: Pulmonary effort is normal. No respiratory distress.     Breath sounds: Normal breath sounds. No wheezing or rales.     Comments: Tenderness to palpation lower anterior ribs.  On the right Chest:     Chest wall: Tenderness present.  Abdominal:     Palpations: Abdomen is soft.     Tenderness: There is abdominal tenderness.     Comments: Tenderness to palpation right upper quadrant  Musculoskeletal:        General: No swelling.     Cervical back: Normal range of motion and neck supple.  Skin:    General: Skin is warm and dry.     Capillary Refill: Capillary refill takes less than 2 seconds.  Neurological:     General: No focal deficit present.     Mental Status: She is alert.  Psychiatric:        Mood and Affect: Mood normal.     ED Results / Procedures / Treatments   Labs (all labs ordered are listed, but only abnormal results are displayed) Labs Reviewed  URINALYSIS, ROUTINE W REFLEX MICROSCOPIC - Abnormal; Notable for the following  components:      Result Value   Specific Gravity, Urine 1.035 (*)    Ketones, ur TRACE (*)    Protein, ur 30 (*)    Leukocytes,Ua LARGE (*)    All other components within normal limits  CBC WITH DIFFERENTIAL/PLATELET - Abnormal; Notable for the following components:   Neutro Abs 7.8 (*)    All other components within normal limits  COMPREHENSIVE METABOLIC PANEL - Abnormal; Notable for the following components:   Glucose, Bld 100 (*)    BUN 32 (*)    Creatinine, Ser 1.67 (*)    GFR, Estimated 32 (*)    All other components within normal limits  LIPASE, BLOOD - Abnormal; Notable for the following components:   Lipase 68 (*)    All  other components within normal limits  URINALYSIS, W/ REFLEX TO CULTURE (INFECTION SUSPECTED)  TROPONIN I (HIGH SENSITIVITY)  TROPONIN I (HIGH SENSITIVITY)    EKG EKG Interpretation  Date/Time:  Sunday November 23 2022 17:30:20 EST Ventricular Rate:  91 PR Interval:  134 QRS Duration: 88 QT Interval:  391 QTC Calculation: 482 R Axis:   -2 Text Interpretation: Sinus rhythm Probable left atrial enlargement Left ventricular hypertrophy Anterior infarct, acute (LAD) >>> Acute MI <<< Confirmed by Fredia Sorrow [phone]) on 11/23/2022 5:44:32 PM  Radiology CT ABDOMEN PELVIS WO CONTRAST  Result Date: 11/23/2022 CLINICAL DATA:  Abdominal pain, acute, nonlocalized EXAM: CT ABDOMEN AND PELVIS WITHOUT CONTRAST TECHNIQUE: Multidetector CT imaging of the abdomen and pelvis was performed following the standard protocol without IV contrast. RADIATION DOSE REDUCTION: This exam was performed according to the departmental dose-optimization program which includes automated exposure control, adjustment of the mA and/or kV according to patient size and/or use of iterative reconstruction technique. COMPARISON:  CT examination dated July 17, 2022 FINDINGS: Lower chest: No acute abnormality. Hepatobiliary: Multiple hypodense hepatic cysts, the largest in the right hepatic  lobe measuring at least 3.3 x 3.4 cm, unchanged. Status post cholecystectomy. No biliary dilatation. Pancreas: Unremarkable. No pancreatic ductal dilatation or surrounding inflammatory changes. Spleen: Normal in size without focal abnormality. Adrenals/Urinary Tract: Postsurgical changes for prior right nephrectomy left kidney is normal in size. No evidence of nephrolithiasis or hydronephrosis Stomach/Bowel: Stomach is within normal limits. Appendix not identified. Retained colonic stool suggesting constipation. No evidence of bowel wall thickening, distention, or inflammatory changes. Vascular/Lymphatic: Aorto bi iliac vascular stent for prior abdominal aortic aneurysm. Evaluation is limited on this noncontrast enhanced examination. Reproductive: Uterus and bilateral adnexa are unremarkable. Other: No abdominal wall hernia or abnormality. No abdominopelvic ascites. Musculoskeletal: Chronic compression deformity of L3 vertebral body with a proximally 50% vertebral body height loss. Multilevel degenerate disc disease of the lumbar spine. No acute osseous abnormality. Paraspinal soft tissues are unremarkable IMPRESSION: 1. No CT evidence of acute abdominal/pelvic process. 2. Retained colonic stool suggesting constipation. 3. Status post right nephrectomy. 4. Aorto bi iliac vascular stent for prior abdominal aortic aneurysm. Evaluation is limited on this noncontrast enhanced examination. 5. Chronic compression deformity of L3 vertebral body with a proximally 50% vertebral body height loss. 6. Multilevel degenerate disc disease of the lumbar spine. Electronically Signed   By: Keane Police D.O.   On: 11/23/2022 19:44   DG Chest Port 1 View  Result Date: 11/23/2022 CLINICAL DATA:  Flank pain, COPD right flank pain. History of right nephrectomy. EXAM: PORTABLE CHEST 1 VIEW COMPARISON:  None Available. FINDINGS: The heart size and mediastinal contours are within normal limits. Both lungs are clear. The visualized  skeletal structures are unremarkable. IMPRESSION: No active disease. Electronically Signed   By: Keane Police D.O.   On: 11/23/2022 17:49    Procedures Procedures    Medications Ordered in ED Medications  0.9 %  sodium chloride infusion ( Intravenous New Bag/Given 11/23/22 1808)  ondansetron (ZOFRAN) injection 4 mg (4 mg Intravenous Given 11/23/22 1714)  fentaNYL (SUBLIMAZE) injection 50 mcg (50 mcg Intravenous Given 11/23/22 1718)  sodium chloride 0.9 % bolus 500 mL (0 mLs Intravenous Stopped 11/23/22 1808)  HYDROmorphone (DILAUDID) injection 1 mg (1 mg Intravenous Given 11/23/22 1811)  morphine (PF) 2 MG/ML injection 2 mg (2 mg Intravenous Given 11/23/22 2140)    ED Course/ Medical Decision Making/ A&P  Medical Decision Making Amount and/or Complexity of Data Reviewed Labs: ordered. Radiology: ordered.  Risk Prescription drug management.   Patient's oxygen saturation is 100%.  Patient with tenderness to the right flank right CVA right upper quadrant a lot of this seems like it could be musculoskeletal.  But also tender to press on her right anterior and lateral lower ribs.  Historically patient's had this happen before and they have not found any acute findings ongoing.  She is followed by gastroenterology as well.  EKG raised concerns for an anterior MI.  Discussed with Dr. Claiborne Billings STEMI cardiologist on-call he reviewed its that he has possibility but story is very atypical and also not classic as far as an anterior STEMI.  Recommend getting troponins.  Patient's labs otherwise lipase 68 complete metabolic panel GFR 32 patient clearly has a history of chronic kidney disease.  This is somewhat baseline for her.  Electrolytes are normal.  LFTs are normal.  Anion gap is normal.  The CBC no leukocytosis hemoglobin 14.1.  Urinalysis suggestive of possible urinary tract infection.  RBCs 11-20 white blood cells 11-20.  She just has 1 kidney we will go ahead and send urine  culture and treat.  But there was not a significant mount of bacteria.  Troponins x 2 are normal.  Chest x-ray no active disease.  CT scan abdomen and pelvis is pending.  Had to do it without IV contrast because of her renal function.   Symptoms seem to be musculoskeletal in nature.  Patient treated with pain medicine here improvement.  Patient treated with oxycodone in the past.  Will put a prescription and have her follow back up with her primary care doctor will have her take Keflex for the next few days.  Will give first dose here tonight.  Patient understands importance of follow-up to make sure that the urinalysis goes back to normal.  And the urine culture is followed up.  Directly patient's had a history of renal cell carcinoma in the past and only has 1 kidney    Final Clinical Impression(s) / ED Diagnoses Final diagnoses:  Flank pain  Acute right-sided low back pain without sciatica  Acute cystitis without hematuria    Rx / DC Orders ED Discharge Orders     None         Fredia Sorrow, MD 11/23/22 Drema Halon    Fredia Sorrow, MD 11/23/22 [phone]

## 2022-11-23 NOTE — ED Notes (Signed)
Pt aware of the need for a urine... Unable to currently... 

## 2022-11-23 NOTE — Discharge Instructions (Signed)
Make an appointment follow-up with your doctor to have urinalysis rechecked to make sure the blood clears in the urine.  Urine culture sent and pending.  The blood in the urine may or may not represent a urinary tract infection.  Take the antibiotic Keflex though as directed.  Take the oxycodone as needed for pain.  Today's workup without any acute findings or explanation for the back pain other than the fact that you do have a chronic L3 compression fracture.

## 2022-11-27 ENCOUNTER — Encounter (HOSPITAL_COMMUNITY): Payer: Self-pay

## 2022-11-27 ENCOUNTER — Emergency Department (HOSPITAL_COMMUNITY): Payer: Medicare Other

## 2022-11-27 ENCOUNTER — Other Ambulatory Visit: Payer: Self-pay

## 2022-11-27 ENCOUNTER — Emergency Department (HOSPITAL_COMMUNITY)
Admission: EM | Admit: 2022-11-27 | Discharge: 2022-11-27 | Disposition: A | Discharge status: Home / Self Care | Payer: Medicare Other | Attending: Emergency Medicine | Admitting: Emergency Medicine

## 2022-11-27 DIAGNOSIS — I509 Heart failure, unspecified: Secondary | ICD-10-CM | POA: Insufficient documentation

## 2022-11-27 DIAGNOSIS — D72829 Elevated white blood cell count, unspecified: Secondary | ICD-10-CM | POA: Diagnosis not present

## 2022-11-27 DIAGNOSIS — R112 Nausea with vomiting, unspecified: Secondary | ICD-10-CM | POA: Insufficient documentation

## 2022-11-27 DIAGNOSIS — Z79899 Other long term (current) drug therapy: Secondary | ICD-10-CM | POA: Diagnosis not present

## 2022-11-27 DIAGNOSIS — R55 Syncope and collapse: Secondary | ICD-10-CM | POA: Insufficient documentation

## 2022-11-27 DIAGNOSIS — I110 Hypertensive heart disease with heart failure: Secondary | ICD-10-CM | POA: Diagnosis not present

## 2022-11-27 DIAGNOSIS — Z7982 Long term (current) use of aspirin: Secondary | ICD-10-CM | POA: Diagnosis not present

## 2022-11-27 DIAGNOSIS — R7989 Other specified abnormal findings of blood chemistry: Secondary | ICD-10-CM | POA: Diagnosis not present

## 2022-11-27 DIAGNOSIS — Z20822 Contact with and (suspected) exposure to covid-19: Secondary | ICD-10-CM | POA: Insufficient documentation

## 2022-11-27 DIAGNOSIS — E876 Hypokalemia: Secondary | ICD-10-CM | POA: Insufficient documentation

## 2022-11-27 LAB — URINALYSIS, ROUTINE W REFLEX MICROSCOPIC
Bacteria, UA: NONE SEEN
Bilirubin Urine: NEGATIVE
Glucose, UA: NEGATIVE mg/dL
Hgb urine dipstick: NEGATIVE
Ketones, ur: NEGATIVE mg/dL
Leukocytes,Ua: NEGATIVE
Nitrite: NEGATIVE
Protein, ur: 30 mg/dL — AB
Specific Gravity, Urine: 1.024 (ref 1.005–1.030)
pH: 6 (ref 5.0–8.0)

## 2022-11-27 LAB — CBC WITH DIFFERENTIAL/PLATELET
Abs Immature Granulocytes: 0.07 10*3/uL (ref 0.00–0.07)
Basophils Absolute: 0 10*3/uL (ref 0.0–0.1)
Basophils Relative: 0 %
Eosinophils Absolute: 0 10*3/uL (ref 0.0–0.5)
Eosinophils Relative: 0 %
HCT: 43.1 % (ref 36.0–46.0)
Hemoglobin: 14.1 g/dL (ref 12.0–15.0)
Immature Granulocytes: 1 %
Lymphocytes Relative: 16 %
Lymphs Abs: 1.7 10*3/uL (ref 0.7–4.0)
MCH: 29.6 pg (ref 26.0–34.0)
MCHC: 32.7 g/dL (ref 30.0–36.0)
MCV: 90.4 fL (ref 80.0–100.0)
Monocytes Absolute: 1 10*3/uL (ref 0.1–1.0)
Monocytes Relative: 9 %
Neutro Abs: 8.2 10*3/uL — ABNORMAL HIGH (ref 1.7–7.7)
Neutrophils Relative %: 74 %
Platelets: 353 10*3/uL (ref 150–400)
RBC: 4.77 MIL/uL (ref 3.87–5.11)
RDW: 12.6 % (ref 11.5–15.5)
WBC: 11.1 10*3/uL — ABNORMAL HIGH (ref 4.0–10.5)
nRBC: 0 % (ref 0.0–0.2)

## 2022-11-27 LAB — BASIC METABOLIC PANEL
Anion gap: 9 (ref 5–15)
BUN: 40 mg/dL — ABNORMAL HIGH (ref 8–23)
CO2: 22 mmol/L (ref 22–32)
Calcium: 6.7 mg/dL — ABNORMAL LOW (ref 8.9–10.3)
Chloride: 106 mmol/L (ref 98–111)
Creatinine, Ser: 1.07 mg/dL — ABNORMAL HIGH (ref 0.44–1.00)
GFR, Estimated: 55 mL/min — ABNORMAL LOW (ref >60)
Glucose, Bld: 73 mg/dL (ref 70–99)
Potassium: 2.8 mmol/L — ABNORMAL LOW (ref 3.5–5.1)
Sodium: 137 mmol/L (ref 135–145)

## 2022-11-27 LAB — TROPONIN I (HIGH SENSITIVITY)
Troponin I (High Sensitivity): 12 ng/L (ref <18)
Troponin I (High Sensitivity): 10 ng/L (ref <18)

## 2022-11-27 LAB — RESP PANEL BY RT-PCR (RSV, FLU A&B, COVID)  RVPGX2
Influenza A by PCR: NEGATIVE
Influenza B by PCR: NEGATIVE
Resp Syncytial Virus by PCR: NEGATIVE
SARS Coronavirus 2 by RT PCR: NEGATIVE

## 2022-11-27 LAB — BRAIN NATRIURETIC PEPTIDE: B Natriuretic Peptide: 301.8 pg/mL — ABNORMAL HIGH (ref 0.0–100.0)

## 2022-11-27 MED ORDER — HYDROCODONE-ACETAMINOPHEN 5-325 MG PO TABS
1.0000 | ORAL_TABLET | Freq: Once | ORAL | Status: AC
  Administered 2022-11-27: 1 via ORAL
  Filled 2022-11-27: qty 1

## 2022-11-27 MED ORDER — METOCLOPRAMIDE HCL 5 MG/ML IJ SOLN
10.0000 mg | Freq: Once | INTRAMUSCULAR | Status: AC
  Administered 2022-11-27: 10 mg via INTRAVENOUS
  Filled 2022-11-27: qty 2

## 2022-11-27 MED ORDER — IOHEXOL 350 MG/ML SOLN
100.0000 mL | Freq: Once | INTRAVENOUS | Status: AC | PRN
  Administered 2022-11-27: 100 mL via INTRAVENOUS

## 2022-11-27 MED ORDER — SODIUM CHLORIDE (PF) 0.9 % IJ SOLN
INTRAMUSCULAR | Status: AC
  Filled 2022-11-27: qty 50

## 2022-11-27 MED ORDER — LACTATED RINGERS IV BOLUS
1000.0000 mL | Freq: Once | INTRAVENOUS | Status: AC
  Administered 2022-11-27: 1000 mL via INTRAVENOUS

## 2022-11-27 MED ORDER — POTASSIUM CHLORIDE CRYS ER 20 MEQ PO TBCR: 40.0000 meq | EXTENDED_RELEASE_TABLET | Freq: Every day | ORAL | 0 refills | Status: DC

## 2022-11-27 MED ORDER — POTASSIUM CHLORIDE CRYS ER 20 MEQ PO TBCR
40.0000 meq | EXTENDED_RELEASE_TABLET | Freq: Once | ORAL | Status: AC
  Administered 2022-11-27: 40 meq via ORAL
  Filled 2022-11-27: qty 2

## 2022-11-27 MED ORDER — POTASSIUM CHLORIDE 10 MEQ/100ML IV SOLN
10.0000 meq | Freq: Once | INTRAVENOUS | Status: AC
  Administered 2022-11-27: 10 meq via INTRAVENOUS
  Filled 2022-11-27: qty 100

## 2022-11-27 NOTE — ED Provider Notes (Signed)
[place] EMERGENCY DEPARTMENT AT [facility] Provider Note   CSN: [id number] Arrival date & time: 11/27/22  0757     History  Chief Complaint  Patient presents with   Loss of Consciousness    Katie [name] is a 77 y.o. female with medical history of nausea and vomiting, AAA, arthritis, CHF, COPD, GERD, hypertension, TB.  Patient presents to ED for evaluation syncope.  Patient reports that this morning she went from sitting to a standing position took a couple steps and then fell to the floor.  Patient denies striking her head, losing consciousness.  Patient reports that ever since Sunday, when she was diagnosed with a UTI, she has been having nausea and vomiting.  Patient reports that she has been taking antibiotics as prescribed, once a day.  On chart review, the patient was placed on antibiotic that you take 4 times daily.  Patient reports she was on the ground for maybe 30 seconds before being helped up by her son.  Patient denies any preceding chest pain or shortness of breath prior to the fall.  Patient denies any dysuria however complains of right-sided flank pain however patient shown to have nephrectomy on recent CT scan.  Patient denies fevers, URI symptoms.  Patient denies one-sided weakness or numbness, slurred speech.   Loss of Consciousness Associated symptoms: nausea and vomiting   Associated symptoms: no chest pain, no fever, no shortness of breath and no weakness        Home Medications Prior to Admission medications   Medication Sig Start Date End Date Taking? Authorizing Provider  potassium chloride SA (KLOR-CON M) 20 MEQ tablet Take 2 tablets (40 mEq total) by mouth daily. 11/27/22  Yes Azucena Cecil, PA-C  acetaminophen (TYLENOL) 500 MG tablet Take 1,000 mg by mouth every 8 (eight) hours as needed for mild pain. Patient not taking: Reported on 08/18/2022    [provider]  albuterol (VENTOLIN HFA) 108 (90 Base) MCG/ACT inhaler Inhale 2  puffs into the lungs every 6 (six) hours as needed for wheezing or shortness of breath. Patient not taking: Reported on 08/18/2022 02/08/20   Brunetta Jeans, PA-C  aspirin EC 81 MG tablet Take 1 tablet (81 mg total) by mouth daily. Patient not taking: Reported on 08/18/2022 09/29/18   Bonnielee Haff, MD  cephALEXin (KEFLEX) 500 MG capsule Take 1 capsule (500 mg total) by mouth 4 (four) times daily. 11/23/22   Fredia Sorrow, MD  ezetimibe (ZETIA) 10 MG tablet Take 10 mg by mouth daily. Patient not taking: Reported on 08/18/2022 06/11/22   [provider]  losartan (COZAAR) 50 MG tablet Take 50 mg by mouth daily. Patient not taking: Reported on 08/18/2022    [provider]  metoCLOPramide (REGLAN) 10 MG tablet Take 1 tablet (10 mg total) by mouth 4 (four) times daily -  before meals and at bedtime. Patient not taking: Reported on 08/18/2022 06/20/22   Starlyn Skeans, MD  metoprolol tartrate (LOPRESSOR) 25 MG tablet Take 1 tablet (25 mg total) by mouth 2 (two) times daily. Patient not taking: Reported on 08/18/2022 04/23/21   Dutch Quint B, FNP  oxyCODONE-acetaminophen (PERCOCET/ROXICET) 5-325 MG tablet Take 1 tablet by mouth every 6 (six) hours as needed for severe pain. 11/23/22   Fredia Sorrow, MD  pantoprazole (PROTONIX) 40 MG tablet Take 1 tablet (40 mg total) by mouth daily. 08/18/22   Zehr, Laban Emperor, PA-C  rosuvastatin (CRESTOR) 40 MG tablet Take 40 mg by mouth daily. Patient  not taking: Reported on 08/18/2022 06/11/22   [provider]  sertraline (ZOLOFT) 50 MG tablet Take 50 mg by mouth daily. Patient not taking: Reported on 08/18/2022 05/26/22   [provider]  sucralfate (CARAFATE) 1 GM/10ML suspension Take 10 mLs (1 g total) by mouth 4 (four) times daily -  with meals and at bedtime. Patient not taking: Reported on 08/18/2022 06/20/22   Starlyn Skeans, MD  TRELEGY ELLIPTA 200-62.5-25 MCG/INH AEPB Inhale 1 puff into the lungs daily. Patient not taking:  Reported on 08/18/2022 07/17/21   [provider]      Allergies    Patient has no known allergies.    Review of Systems   Review of Systems  Constitutional:  Negative for fever.  Respiratory:  Negative for shortness of breath.   Cardiovascular:  Positive for syncope. Negative for chest pain.  Gastrointestinal:  Positive for nausea and vomiting. Negative for abdominal pain and diarrhea.  Genitourinary:  Positive for flank pain. Negative for dysuria.  Neurological:  Negative for speech difficulty, weakness and numbness.  All other systems reviewed and are negative.   Physical Exam Updated Vital Signs BP 100/80   Pulse (!) 102   Temp 98.2 F (36.8 C) (Oral)   Resp 20   Ht 5' 1"$  (1.549 m)   Wt 53 kg   SpO2 98%   BMI 22.08 kg/m  Physical Exam Vitals and nursing note reviewed.  Constitutional:      General: She is not in acute distress.    Appearance: Normal appearance. She is not ill-appearing, toxic-appearing or diaphoretic.  HENT:     Head: Normocephalic and atraumatic.     Nose: Nose normal. No congestion.     Mouth/Throat:     Mouth: Mucous membranes are moist.     Pharynx: Oropharynx is clear.  Eyes:     Extraocular Movements: Extraocular movements intact.     Conjunctiva/sclera: Conjunctivae normal.     Pupils: Pupils are equal, round, and reactive to light.  Cardiovascular:     Rate and Rhythm: Normal rate and regular rhythm.  Pulmonary:     Effort: Pulmonary effort is normal.     Breath sounds: Decreased breath sounds present.  Abdominal:     General: Abdomen is flat. Bowel sounds are normal.     Palpations: Abdomen is soft.     Tenderness: There is right CVA tenderness. There is no left CVA tenderness.  Neurological:     General: No focal deficit present.     Mental Status: She is alert.     GCS: GCS eye subscore is 4. GCS verbal subscore is 5. GCS motor subscore is 6.     Cranial Nerves: Cranial nerves 2-12 are intact. No cranial nerve deficit.      Sensory: Sensation is intact. No sensory deficit.     Motor: Motor function is intact. No weakness.     ED Results / Procedures / Treatments   Labs (all labs ordered are listed, but only abnormal results are displayed) Labs Reviewed  CBC WITH DIFFERENTIAL/PLATELET - Abnormal; Notable for the following components:      Result Value   WBC 11.1 (*)    Neutro Abs 8.2 (*)    All other components within normal limits  BASIC METABOLIC PANEL - Abnormal; Notable for the following components:   Potassium 2.8 (*)    BUN 40 (*)    Creatinine, Ser 1.07 (*)    Calcium 6.7 (*)    GFR, Estimated  55 (*)    All other components within normal limits  URINALYSIS, ROUTINE W REFLEX MICROSCOPIC - Abnormal; Notable for the following components:   Protein, ur 30 (*)    All other components within normal limits  BRAIN NATRIURETIC PEPTIDE - Abnormal; Notable for the following components:   B Natriuretic Peptide 301.8 (*)    All other components within normal limits  RESP PANEL BY RT-PCR (RSV, FLU A&B, COVID)  RVPGX2  TROPONIN I (HIGH SENSITIVITY)  TROPONIN I (HIGH SENSITIVITY)    EKG EKG Interpretation  Date/Time:  Thursday November 27 2022 08:03:23 EST Ventricular Rate:  80 PR Interval:  137 QRS Duration: 88 QT Interval:  428 QTC Calculation: 494 R Axis:   23 Text Interpretation: Sinus rhythm Probable left atrial enlargement Probable left ventricular hypertrophy Anterior ST elevation, probably due to LVH Borderline prolonged QT interval No significant change was found Confirmed by Ezequiel Essex [phone]) on 11/27/2022 8:37:47 AM  Radiology CT Angio Chest/Abd/Pel for Dissection W and/or Wo Contrast  Result Date: 11/27/2022 CLINICAL DATA:  Unwitnessed syncopal episode. Low back pain. Weakness, dizziness, nausea and vomiting. History of endovascular stent graft repair of AAA 01/10/2022. EXAM: CT ANGIOGRAPHY CHEST, ABDOMEN AND PELVIS TECHNIQUE: Non-contrast CT of the chest was initially obtained.  Multidetector CT imaging through the chest, abdomen and pelvis was performed using the standard protocol during bolus administration of intravenous contrast. Multiplanar reconstructed images and MIPs were obtained and reviewed to evaluate the vascular anatomy. RADIATION DOSE REDUCTION: This exam was performed according to the departmental dose-optimization program which includes automated exposure control, adjustment of the mA and/or kV according to patient size and/or use of iterative reconstruction technique. CONTRAST:  162m OMNIPAQUE IOHEXOL 350 MG/ML SOLN COMPARISON:  11/23/2022 CT abdomen/pelvis. 11/19/2022 screening chest CT FINDINGS: CTA CHEST FINDINGS Cardiovascular: Normal heart size. No significant pericardial effusion/thickening. Mitral valve prosthesis in place. Three-vessel coronary atherosclerosis. Atherosclerotic nonaneurysmal thoracic aorta. No evidence of acute intramural hematoma, dissection or penetrating atherosclerotic ulcer in the thoracic aorta. Aortic arch branch vessels are patent. Dilated main pulmonary artery (3.5 cm diameter). No central pulmonary emboli. Mediastinum/Nodes: No significant thyroid nodules. Unremarkable esophagus. Surgical clips in the right axilla. No axillary adenopathy. Stable 2.0 cm right pericardiophrenic cyst. No pathologically enlarged mediastinal or hilar nodes. Lungs/Pleura: No pneumothorax. No pleural effusion. Mild-to-moderate centrilobular emphysema with diffuse bronchial wall thickening. Stable calcified 1.5 cm subpleural plaque-like nodule along the right hemidiaphragm (series 6/image 113). No acute consolidative airspace disease, lung masses or significant pulmonary nodules. Musculoskeletal: No aggressive appearing focal osseous lesions. Mild lower thoracic spondylosis. Review of the MIP images confirms the above findings. CTA ABDOMEN AND PELVIS FINDINGS VASCULAR Aorta: Atherosclerotic abdominal aorta with infrarenal 4.8 cm abdominal aortic aneurysm status  post aorto bi-iliac stent graft repair without appreciable endoleak. No acute periaortic fat stranding. No evidence of acute intramural hematoma, dissection or significant stenosis in the abdominal aorta. Celiac: Patent without evidence of aneurysm, dissection, vasculitis or significant stenosis. SMA: Patent without evidence of aneurysm, dissection, vasculitis or significant stenosis. Renals: Patent left renal artery without significant stenosis, aneurysm, dissection, vasculitis or fibromuscular dysplasia. IMA: Occluded proximally. Inflow: Patent without evidence of aneurysm, dissection, vasculitis or significant stenosis. Veins: No obvious venous abnormality within the limitations of this arterial phase study. Review of the MIP images confirms the above findings. NON-VASCULAR Hepatobiliary: Normal liver size. Scattered benign liver cysts, largest 4.3 cm the right liver dome. No suspicious liver masses. Cholecystectomy. Bile ducts are stable and within normal post cholecystectomy limits with CBD diameter  7 mm. Pancreas: Normal, with no mass or duct dilation. Spleen: Normal size. No mass. Adrenals/Urinary Tract: Status post right nephrectomy with no right nephrectomy bed mass or fluid collection. No adrenal nodules. No left hydronephrosis. No contour deforming left renal masses. Normal bladder. Stomach/Bowel: Normal non-distended stomach. Normal caliber small bowel with no small bowel wall thickening. Appendectomy. Scattered mild left colonic diverticulosis, with no large bowel wall thickening or significant pericolonic fat stranding. Vascular/Lymphatic: No pathologically enlarged lymph nodes in the abdomen or pelvis. Reproductive: Grossly normal uterus.  No adnexal mass. Other: No pneumoperitoneum, ascites or focal fluid collection. Musculoskeletal: No aggressive appearing focal osseous lesions. Chronic severe L3 vertebral compression fracture. Moderate multilevel lumbar degenerative disc disease. No acute fracture.  Review of the MIP images confirms the above findings. IMPRESSION: 1. No acute aortic syndrome. 2. Infrarenal 4.8 cm abdominal aortic aneurysm status post aortobi-iliac stent graft repair without appreciable endoleak. 3. Dilated main pulmonary artery, suggesting pulmonary arterial hypertension. 4. Three-vessel coronary atherosclerosis. 5. Mild-to-moderate centrilobular emphysema with diffuse bronchial wall thickening, suggesting COPD. 6. Mild left colonic diverticulosis. 7. Chronic severe L3 vertebral compression fracture. 8.  Emphysema (ICD10-J43.9). Electronically Signed   By: Ilona Sorrel M.D.   On: 11/27/2022 10:05   DG Chest 2 View  Result Date: 11/27/2022 CLINICAL DATA:  Chest pain, syncope EXAM: CHEST - 2 VIEW COMPARISON:  11/23/2022 and FINDINGS: The heart size and mediastinal contours are within normal limits stable. Cardiac valve replacement. Aortic atherosclerosis. No focal airspace consolidation, pleural effusion, or pneumothorax. The visualized skeletal structures are unremarkable. IMPRESSION: No active cardiopulmonary disease. Electronically Signed   By: Davina Poke D.O.   On: 11/27/2022 08:34    Procedures Procedures   Medications Ordered in ED Medications  lactated ringers bolus 1,000 mL (0 mLs Intravenous Stopped 11/27/22 0936)  metoCLOPramide (REGLAN) injection 10 mg (10 mg Intravenous Given 11/27/22 0914)  potassium chloride SA (KLOR-CON M) CR tablet 40 mEq (40 mEq Oral Given 11/27/22 0914)  sodium chloride (PF) 0.9 % injection (  Given by Other 11/27/22 0946)  iohexol (OMNIPAQUE) 350 MG/ML injection 100 mL (100 mLs Intravenous Contrast Given 11/27/22 0922)  HYDROcodone-acetaminophen (NORCO/VICODIN) 5-325 MG per tablet 1 tablet (1 tablet Oral Given 11/27/22 1146)  potassium chloride 10 mEq in 100 mL IVPB (0 mEq Intravenous Stopped 11/27/22 1305)    ED Course/ Medical Decision Making/ A&P  Medical Decision Making  77 year old female presents to the ED for evaluation.  Please see HPI  for further details.  On exam patient afebrile, nontachycardic.  Lung sounds clear bilaterally, not hypoxic on room air, does have decreased breath sounds secondary to COPD.  Abdomen soft and compressible throughout, patient does have right-sided CVA tenderness.  Normal neurological examination.  Patient workup will include CBC, BMP, troponin x 2, viral panel, urinalysis, BNP, chest x-ray, EKG.    CBC is unremarkable besides elevated leukocytosis to 11.1.  Patient BMP shows decrease potassium to 2.8 repleted with 10 mEq of IV potassium, 40 mEq oral potassium.  Patient will be sent home on 40 mEq potassium for the next 3 days.  Patient creatinine stable.  Urinalysis unremarkable.  Patient troponin 12 and 10 respectively.  Viral panel negative for all.  BNP elevated to 301 however decreased from patient baseline.  Patient denies feeling volume overloaded.  Patient denies shortness of breath.  Chest x-ray unremarkable.  EKG nonischemic.  After discussion with attending, Dr. Wyvonnia Dusky feels as though this patient requires CT chest abdomen pelvis to rule out dissection.  CT  angio chest abdomen pelvis unremarkable for all.  Patient provided 10 mg Reglan for nausea due to QT prolongation, 1 L LR for fluid rehydration.  Patient orthostatic vital signs were positive.  Patient provided 5 mg hydrocodone for back pain.  Patient has had no nausea or vomiting while she has been here.  At this time, patient vital signs orthostatic.  Patient provided 1 L fluid, reports he feels better at this time.  Prior to discharge, patient reports that she does not have anyone to help take care of her at home.  On further questioning, patient reports that her son takes care of her at home however he leaves during the day for work.  Patient requesting a home health aide resources.  Social work consult is been placed at this time.  Update: Hulan Amato, RN case manager, has come and seen the patient and provided her with resources to  establish home health.  Patient safer discharge at this time.  Patient discharged in stable condition.   Final Clinical Impression(s) / ED Diagnoses Final diagnoses:  Near syncope    Rx / DC Orders ED Discharge Orders          Ordered    potassium chloride SA (KLOR-CON M) 20 MEQ tablet  Daily        11/27/22 1445              Azucena Cecil, PA-C 11/27/22 1446    Ezequiel Essex, MD 11/30/22 1047

## 2022-11-27 NOTE — ED Notes (Signed)
Patient transported to CT 

## 2022-11-27 NOTE — ED Notes (Signed)
While discharging patient. Pt states she does not feel safe going home as she's scared she is going to pass out and no one will be around. Her son works most days and is unable to be home with her. Md rancour made aware

## 2022-11-27 NOTE — ED Notes (Signed)
ED Provider at bedside. 

## 2022-11-27 NOTE — Care Management (Addendum)
Transition of Care [facility]) - Emergency Department Mini Assessment   Patient Details  Name: Katie [name] MRN: [id number] Date of Birth: [birth year]  [facility]) CM/SW Contact:    Roseanne Kaufman, RN Phone Number: 11/27/2022, 1:55 PM   Clinical Narrative: This RNCM received TOC consult for home health services. This RNCM spoke with patient at bedside to discuss need for [facility] services, private duty nursing and a referral for a "[facility]". Patient is in agreement with home health services HHPT/OT. Patient reports she is unable to afford private duty nursing. Patient reports her daughter wants to speak with me. This RNCM spoke with patient's daughter Katie [name] who reports, " my brother lives with my mother but he is an addict. My mother continues to fall. We are not coming to pick her up until you all find her somewhere to go." This RNCM advised a referral can be sent to [facility] for assistance with long term care. Due to the patient not having the insurance for long term care the hospital is unable to assist however [facility] can assist with this in the community. Patient's daughter Katie [name] then states " We are not coming to pick her up, I know this is the not the right way to do this. I will start her Medicaid application today."  The patient reports her neighbor can assist her and will pick her up at discharge. This RNCM coordinated HHPT/OT with [facility]. Per Malachy Mood the services will not start until next week. Referral made to [facility] with [facility]. Patient is the best contact.   This RNCM spoke with patient again to advise HHPT/OT has been set up. Patient should receive a call from [facility] to assist with long term care placement. Patient very tearful that her family is refusing to pick her up from the [facility] ED.   EDP notified.  No additional TOC needs at this time   ED Mini Assessment:  Patient from home, has had multiple falls within the last six  months. Patient has support from her neighbor, who will pick patient up at discharge.           Interventions which prevented an admission or readmission: Home Health Consult or Services    Patient Contact and Communications        ,                 Admission diagnosis:  syncope Patient Active Problem List   Diagnosis Date Noted   Nausea and vomiting 06/19/2022   Abdominal pain, epigastric    Loss of weight    Ileus ([place]) 01/14/2022   Abnormal urinalysis 01/11/2022   Abnormal echocardiogram 01/11/2022   Gastroenteritis 01/10/2022   Thyroid nodule 01/10/2022   Hepatic cyst 01/10/2022   Abdominal aortic aneurysm (AAA) 3.0 cm to 5.0 cm in diameter in female ([place]) 01/09/2022   Intractable nausea and vomiting 07/23/2021   Generalized abdominal pain 07/23/2021   QT prolongation 07/23/2021   AAA (abdominal aortic aneurysm) without rupture ([place]) 05/04/2020   Atrial fibrillation ([place]) 04/26/2019   S/P minimally invasive mitral valve repair 04/20/2019   Chronic diastolic congestive heart failure ([place])    Pneumonia 04/04/2019   Smokers' cough ([place]) 03/13/2019   COPD with acute exacerbation ([place]) 03/13/2019   Fx metatarsal 03/07/2019   CKD stage G3a/A1, GFR 45-59 and albumin creatinine ratio <30 mg/g ([facility]) 09/29/2018   Osteoporosis 12/31/2016   Arthralgia of both  hands 11/11/2016   Chest pain 04/17/2016   Hyperglycemia 08/03/2015   Esophageal reflux    Depression with anxiety    AKI (acute kidney injury) ([place])    Tobacco abuse disorder 06/27/2014   Insomnia 07/15/2013   Severe mitral regurgitation 09/29/2011   COPD 09/26/2011   Essential hypertension 09/26/2011   HLD (hyperlipidemia) 09/26/2011   S/P cholecystectomy 84/09/8207   Holosystolic murmur 13/88/7195   PCP:  Roselee Nova, MD Pharmacy:   [facility] [place], [place] [address] [place] [address] Phone:  [phone] Fax: [place], [place] [address] Phone: [phone] Fax: [phone]

## 2022-11-27 NOTE — ED Notes (Signed)
Patient transported to X-ray 

## 2022-11-27 NOTE — ED Notes (Signed)
PA Gerald Stabs made aware of positive orthostatic vs

## 2022-11-27 NOTE — ED Notes (Signed)
Pt ambulated with no assistance UAL steady gait. Pt getting dressed.

## 2022-11-27 NOTE — ED Triage Notes (Signed)
Was standing in the kitchen and had unwit syncope episode. Son found her on the ground. C/o lower back pain, no thinners. C/o weakness, dizzy, n/v, low po intake since Sunday.

## 2022-11-27 NOTE — ED Notes (Signed)
Pt back from ct scan.

## 2022-11-27 NOTE — Discharge Instructions (Addendum)
Return to the ED with any new or worsening signs or symptoms Please await the call from home health aide as indicated by social worker Please pick up prescription for potassium that I have sent to your pharmacy on file Please read the attached guide concerning syncope Please continue hydrating yourself as a patient today You may also attempt to establish care with [facility]. Their number is [phone]

## 2023-01-02 ENCOUNTER — Emergency Department (HOSPITAL_COMMUNITY)
Admission: EM | Admit: 2023-01-02 | Discharge: 2023-01-02 | Disposition: A | Discharge status: Home / Self Care | Payer: Medicare Other | Attending: Emergency Medicine | Admitting: Emergency Medicine

## 2023-01-02 DIAGNOSIS — Z7982 Long term (current) use of aspirin: Secondary | ICD-10-CM | POA: Insufficient documentation

## 2023-01-02 DIAGNOSIS — R112 Nausea with vomiting, unspecified: Secondary | ICD-10-CM

## 2023-01-02 DIAGNOSIS — I509 Heart failure, unspecified: Secondary | ICD-10-CM | POA: Insufficient documentation

## 2023-01-02 DIAGNOSIS — J449 Chronic obstructive pulmonary disease, unspecified: Secondary | ICD-10-CM | POA: Diagnosis not present

## 2023-01-02 DIAGNOSIS — Z1152 Encounter for screening for COVID-19: Secondary | ICD-10-CM | POA: Diagnosis not present

## 2023-01-02 LAB — CBC WITH DIFFERENTIAL/PLATELET
Abs Immature Granulocytes: 0.03 10*3/uL (ref 0.00–0.07)
Basophils Absolute: 0 10*3/uL (ref 0.0–0.1)
Basophils Relative: 0 %
Eosinophils Absolute: 0 10*3/uL (ref 0.0–0.5)
Eosinophils Relative: 0 %
HCT: 43 % (ref 36.0–46.0)
Hemoglobin: 14.7 g/dL (ref 12.0–15.0)
Immature Granulocytes: 0 %
Lymphocytes Relative: 11 %
Lymphs Abs: 1.3 10*3/uL (ref 0.7–4.0)
MCH: 29.4 pg (ref 26.0–34.0)
MCHC: 34.2 g/dL (ref 30.0–36.0)
MCV: 86 fL (ref 80.0–100.0)
Monocytes Absolute: 0.6 10*3/uL (ref 0.1–1.0)
Monocytes Relative: 5 %
Neutro Abs: 10.3 10*3/uL — ABNORMAL HIGH (ref 1.7–7.7)
Neutrophils Relative %: 84 %
Platelets: 272 10*3/uL (ref 150–400)
RBC: 5 MIL/uL (ref 3.87–5.11)
RDW: 12.1 % (ref 11.5–15.5)
WBC: 12.3 10*3/uL — ABNORMAL HIGH (ref 4.0–10.5)
nRBC: 0 % (ref 0.0–0.2)

## 2023-01-02 LAB — COMPREHENSIVE METABOLIC PANEL
ALT: 10 U/L (ref 0–44)
AST: 23 U/L (ref 15–41)
Albumin: 3.7 g/dL (ref 3.5–5.0)
Alkaline Phosphatase: 72 U/L (ref 38–126)
Anion gap: 15 (ref 5–15)
BUN: 54 mg/dL — ABNORMAL HIGH (ref 8–23)
CO2: 30 mmol/L (ref 22–32)
Calcium: 9.7 mg/dL (ref 8.9–10.3)
Chloride: 88 mmol/L — ABNORMAL LOW (ref 98–111)
Creatinine, Ser: 1.72 mg/dL — ABNORMAL HIGH (ref 0.44–1.00)
GFR, Estimated: 31 mL/min — ABNORMAL LOW (ref >60)
Glucose, Bld: 110 mg/dL — ABNORMAL HIGH (ref 70–99)
Potassium: 3.7 mmol/L (ref 3.5–5.1)
Sodium: 133 mmol/L — ABNORMAL LOW (ref 135–145)
Total Bilirubin: 1 mg/dL (ref 0.3–1.2)
Total Protein: 6.5 g/dL (ref 6.5–8.1)

## 2023-01-02 LAB — MAGNESIUM: Magnesium: 2.2 mg/dL (ref 1.7–2.4)

## 2023-01-02 LAB — RESP PANEL BY RT-PCR (RSV, FLU A&B, COVID)  RVPGX2
Influenza A by PCR: NEGATIVE
Influenza B by PCR: NEGATIVE
Resp Syncytial Virus by PCR: NEGATIVE
SARS Coronavirus 2 by RT PCR: NEGATIVE

## 2023-01-02 LAB — LIPASE, BLOOD: Lipase: 52 U/L — ABNORMAL HIGH (ref 11–51)

## 2023-01-02 MED ORDER — METOPROLOL TARTRATE 25 MG PO TABS
25.0000 mg | ORAL_TABLET | ORAL | Status: AC
  Administered 2023-01-02: 25 mg via ORAL
  Filled 2023-01-02: qty 1

## 2023-01-02 MED ORDER — LACTATED RINGERS IV BOLUS
1000.0000 mL | Freq: Once | INTRAVENOUS | Status: AC
  Administered 2023-01-02: 1000 mL via INTRAVENOUS

## 2023-01-02 MED ORDER — METOCLOPRAMIDE HCL 5 MG/ML IJ SOLN
10.0000 mg | Freq: Once | INTRAMUSCULAR | Status: AC
  Administered 2023-01-02: 10 mg via INTRAVENOUS
  Filled 2023-01-02: qty 2

## 2023-01-02 MED ORDER — ONDANSETRON HCL 4 MG/2ML IJ SOLN: 4.0000 mg | Freq: Once | INTRAMUSCULAR | Status: DC

## 2023-01-02 NOTE — ED Notes (Addendum)
Patient states "feeling better"

## 2023-01-02 NOTE — ED Notes (Addendum)
No verbal c/o n/v/d

## 2023-01-02 NOTE — ED Notes (Addendum)
Patient using bathroom 

## 2023-01-02 NOTE — ED Provider Notes (Signed)
[facility] Provider Note   CSN: [id number] Arrival date & time: 01/02/23  1423     History {Add pertinent medical, surgical, social history, OB history to HPI:1} No chief complaint on file.   Katie [name] is a 77 y.o. female.  77 year old female with history of AAA, CHF, COPD, and chronic nausea and vomiting presents emergency department nausea and vomiting.  Patient reports that on Monday she started to experience nonbloody nonbilious emesis.  Has been happening in the evening since then with the exception of yesterday when she thought she was getting better.  Then had it recurred today.  Has had some mild abdominal pain associated with it.  Says that this typically happens once a month.  Says that this is similar to prior episodes including her abdominal pain.  Has had multiple CT scans in the past and is seeing GI who is unsure of exactly what is causing her symptoms.  Denies any marijuana use.  Has not been diagnosed with gastroparesis.  Last bowel movement was today and is still passing gas.  Says she tried home antiemetics but is unsure of which ones she has there.       Home Medications Prior to Admission medications   Medication Sig Start Date End Date Taking? Authorizing Provider  acetaminophen (TYLENOL) 500 MG tablet Take 1,000 mg by mouth every 8 (eight) hours as needed for mild pain. Patient not taking: Reported on 08/18/2022    [provider]  albuterol (VENTOLIN HFA) 108 (90 Base) MCG/ACT inhaler Inhale 2 puffs into the lungs every 6 (six) hours as needed for wheezing or shortness of breath. Patient not taking: Reported on 08/18/2022 02/08/20   Brunetta Jeans, PA-C  aspirin EC 81 MG tablet Take 1 tablet (81 mg total) by mouth daily. Patient not taking: Reported on 08/18/2022 09/29/18   Bonnielee Haff, MD  cephALEXin (KEFLEX) 500 MG capsule Take 1 capsule (500 mg total) by mouth 4 (four) times daily. 11/23/22    Fredia Sorrow, MD  ezetimibe (ZETIA) 10 MG tablet Take 10 mg by mouth daily. Patient not taking: Reported on 08/18/2022 06/11/22   [provider]  losartan (COZAAR) 50 MG tablet Take 50 mg by mouth daily. Patient not taking: Reported on 08/18/2022    [provider]  metoCLOPramide (REGLAN) 10 MG tablet Take 1 tablet (10 mg total) by mouth 4 (four) times daily -  before meals and at bedtime. Patient not taking: Reported on 08/18/2022 06/20/22   Starlyn Skeans, MD  metoprolol tartrate (LOPRESSOR) 25 MG tablet Take 1 tablet (25 mg total) by mouth 2 (two) times daily. Patient not taking: Reported on 08/18/2022 04/23/21   Dutch Quint B, FNP  oxyCODONE-acetaminophen (PERCOCET/ROXICET) 5-325 MG tablet Take 1 tablet by mouth every 6 (six) hours as needed for severe pain. 11/23/22   Fredia Sorrow, MD  pantoprazole (PROTONIX) 40 MG tablet Take 1 tablet (40 mg total) by mouth daily. 08/18/22   Zehr, Janett Billow D, PA-C  potassium chloride SA (KLOR-CON M) 20 MEQ tablet Take 2 tablets (40 mEq total) by mouth daily. 11/27/22   Azucena Cecil, PA-C  rosuvastatin (CRESTOR) 40 MG tablet Take 40 mg by mouth daily. Patient not taking: Reported on 08/18/2022 06/11/22   [provider]  sertraline (ZOLOFT) 50 MG tablet Take 50 mg by mouth daily. Patient not taking: Reported on 08/18/2022 05/26/22   [provider]  sucralfate (CARAFATE) 1 GM/10ML suspension Take 10 mLs (1 g total)  by mouth 4 (four) times daily -  with meals and at bedtime. Patient not taking: Reported on 08/18/2022 06/20/22   Starlyn Skeans, MD  TRELEGY ELLIPTA 200-62.5-25 MCG/INH AEPB Inhale 1 puff into the lungs daily. Patient not taking: Reported on 08/18/2022 07/17/21   [provider]      Allergies    Patient has no known allergies.    Review of Systems   Review of Systems  Physical Exam Updated Vital Signs BP (!) 158/109 (BP Location: Right Arm)   Pulse 100   Temp 97.9 F (36.6 C) (Oral)    Resp 18   SpO2 100%  Physical Exam Vitals and nursing note reviewed.  Constitutional:      General: She is not in acute distress.    Appearance: She is well-developed.  HENT:     Head: Normocephalic and atraumatic.     Right Ear: External ear normal.     Left Ear: External ear normal.     Nose: Nose normal.  Eyes:     Extraocular Movements: Extraocular movements intact.     Conjunctiva/sclera: Conjunctivae normal.     Pupils: Pupils are equal, round, and reactive to light.  Cardiovascular:     Rate and Rhythm: Normal rate and regular rhythm.  Pulmonary:     Effort: Pulmonary effort is normal. No respiratory distress.  Abdominal:     General: Abdomen is flat. There is no distension.     Palpations: Abdomen is soft. There is no mass.     Tenderness: There is abdominal tenderness (Diffuse). There is no guarding.  Musculoskeletal:     Cervical back: Normal range of motion and neck supple.     Right lower leg: No edema.     Left lower leg: No edema.  Skin:    General: Skin is warm and dry.  Neurological:     Mental Status: She is alert and oriented to person, place, and time. Mental status is at baseline.  Psychiatric:        Mood and Affect: Mood normal.     ED Results / Procedures / Treatments   Labs (all labs ordered are listed, but only abnormal results are displayed) Labs Reviewed - No data to display  EKG None  Radiology No results found.  Procedures Procedures  {Document cardiac monitor, telemetry assessment procedure when appropriate:1}  Medications Ordered in ED Medications - No data to display  ED Course/ Medical Decision Making/ A&P   {   Click here for ABCD2, HEART and other calculatorsREFRESH Note before signing :1}                          Medical Decision Making Amount and/or Complexity of Data Reviewed Labs: ordered.  Risk Prescription drug management.   ***  {Document critical care time when appropriate:1} {Document review of labs and  clinical decision tools ie heart score, Chads2Vasc2 etc:1}  {Document your independent review of radiology images, and any outside records:1} {Document your discussion with family members, caretakers, and with consultants:1} {Document social determinants of health affecting pt's care:1} {Document your decision making why or why not admission, treatments were needed:1} Final Clinical Impression(s) / ED Diagnoses Final diagnoses:  None    Rx / DC Orders ED Discharge Orders     None

## 2023-01-02 NOTE — Discharge Instructions (Signed)
You were seen for your nausea and vomiting in the emergency department.   At home, please take your nausea medications as prescribed and stay well-hydrated.    Follow-up with your primary doctor in 2-3 days regarding your visit.  Follow-up with GI as well.  Return immediately to the emergency department if you experience any of the following: Vomiting despite your medications, or any other concerning symptoms.    Thank you for visiting our Emergency Department. It was a pleasure taking care of you today.

## 2023-01-02 NOTE — ED Triage Notes (Signed)
Patient BIB home c/o n/v since Monday, recurrent once a month for a year. Chills and body aches. CBG 93. 20gLAC 4mg  Zofran, 234ml IVF.

## 2023-03-05 ENCOUNTER — Encounter: Payer: Self-pay | Admitting: Pulmonary Disease

## 2023-03-05 ENCOUNTER — Ambulatory Visit (INDEPENDENT_AMBULATORY_CARE_PROVIDER_SITE_OTHER): Payer: Medicare Other

## 2023-03-05 ENCOUNTER — Ambulatory Visit: Payer: Medicare Other | Admitting: Pulmonary Disease

## 2023-03-05 VITALS — BP 126/72 | HR 73 | Ht 61.0 in | Wt 119.0 lb

## 2023-03-05 DIAGNOSIS — R051 Acute cough: Secondary | ICD-10-CM

## 2023-03-05 MED ORDER — FLUTICASONE PROPIONATE 50 MCG/ACT NA SUSP
1.0000 | Freq: Every day | NASAL | 3 refills | Status: DC
Start: 2023-03-05 — End: 2024-03-10

## 2023-03-05 MED ORDER — PREDNISONE 10 MG PO TABS: ORAL_TABLET | ORAL | 0 refills | Status: AC

## 2023-03-05 MED ORDER — TRELEGY ELLIPTA 200-62.5-25 MCG/ACT IN AEPB: 1.0000 | INHALATION_SPRAY | Freq: Every day | RESPIRATORY_TRACT | 0 refills | Status: AC

## 2023-03-05 MED ORDER — CETIRIZINE HCL 10 MG PO TABS: 10.0000 mg | ORAL_TABLET | Freq: Every day | ORAL | 11 refills | Status: DC

## 2023-03-05 NOTE — Progress Notes (Signed)
@Patient  ID: Katie [name], female    DOB: 1946/04/11, 77 y.o.   MRN: [id number]  Chief Complaint  Patient presents with   Consult    Referred by PCP for increased SOB and productive cough for the past month.     Referring provider: Ellyn Hack, MD  HPI:   77 y.o. with a history of COPD confirmed on spirometry in 2020 history of pulmonary hypertension confirmed on right heart cath in 2020 presents to clinic with subacute cough and bronchitis symptoms.  Most recent pulmonary note reviewed.  Multiple cardiology notes reviewed.  Complains of cough congestion and wheezing.  Present for the last couple of weeks.  On review though she states she is gotten a course of steroids and antibiotics twice over the last couple of months.  Most recently about a month ago.  Feels short of breath some chest tightness.  Productive cough.  Worse than baseline.  Previously was on Trelegy.  Seem to control symptoms well.  Was off this for some period of time.  Recently represcribed but she has not picked it up or started using yet.  She states no chest imaging with her recent symptoms.  Reviewed prior chest imaging including CT chest which shows emphysema, chest x-ray 11/2022 demonstrates hyperinflation on my review interpretation.    Questionaires / Pulmonary Flowsheets:   ACT:      No data to display          MMRC:     No data to display          Epworth:      No data to display          Tests:   FENO:  No results found for: "NITRICOXIDE"  PFT:    Latest Ref Rng & Units 03/29/2019    2:40 PM 04/20/2015    1:01 PM  PFT Results  FVC-Pre L 1.24  2.36   FVC-Predicted Pre % 47  86   FVC-Post L 1.51  2.48   FVC-Predicted Post % 57  90   Pre FEV1/FVC % % 52  74   Post FEV1/FCV % % 56  73   FEV1-Pre L 0.65  1.75   FEV1-Predicted Pre % 33  84   FEV1-Post L 0.84  1.81   DLCO uncorrected ml/min/mmHg 0.03  19.62   DLCO UNC% % 0  97   DLVA Predicted % 1  100   TLC L 6.26     TLC % Predicted % 135    RV % Predicted % 231    Personally reviewed and interpreted as spirometry suggestive of severe restriction versus air trapping, no bronchodilator response, severe obstruction present, lung volumes confirm hyperinflation and air trapping, DLCO unable to be interpreted  WALK:     04/20/2015    2:29 PM  SIX MIN WALK  Supplimental Oxygen during Test? (L/min) No  Tech Comments: Pt able to complete 3 laps at a brisk and steady pace. No c/o SOB during or after exam     Imaging: No results found.  Lab Results:  CBC    Component Value Date/Time   WBC 12.3 (H) 01/02/2023 1629   RBC 5.00 01/02/2023 1629   HGB 14.7 01/02/2023 1629   HCT 43.0 01/02/2023 1629   PLT 272 01/02/2023 1629   MCV 86.0 01/02/2023 1629   MCH 29.4 01/02/2023 1629   MCHC 34.2 01/02/2023 1629   RDW 12.1 01/02/2023 1629   LYMPHSABS 1.3 01/02/2023 1629  MONOABS 0.6 01/02/2023 1629   EOSABS 0.0 01/02/2023 1629   BASOSABS 0.0 01/02/2023 1629    BMET    Component Value Date/Time   NA 133 (L) 01/02/2023 1629   NA 140 06/07/2021 1438   K 3.7 01/02/2023 1629   CL 88 (L) 01/02/2023 1629   CO2 30 01/02/2023 1629   GLUCOSE 110 (H) 01/02/2023 1629   BUN 54 (H) 01/02/2023 1629   BUN 21 06/07/2021 1438   CREATININE 1.72 (H) 01/02/2023 1629   CREATININE 1.81 (H) 06/03/2016 1631   CALCIUM 9.7 01/02/2023 1629   GFRNONAA 31 (L) 01/02/2023 1629   GFRAA 37 (L) 04/26/2020 0246    BNP    Component Value Date/Time   BNP 301.8 (H) 11/27/2022 0824    ProBNP No results found for: "PROBNP"  Specialty Problems       Pulmonary Problems   COPD    Followed in Pulmonary clinic/ [facility]/ [facility] - PFT's 04/20/2015 wnl except dlco 52% - 04/20/2015  Walked RA x 3 laps @ 185 ft each stopped due to  End study sats ok brisk pace/ no desat  - 04/20/2015 p extensive coaching HFA effectiveness =    75%       COPD with acute exacerbation (HCC)   Smokers' cough (HCC)   Pneumonia    No Known  Allergies  Immunization History  Administered Date(s) Administered   Fluad Quad(high Dose 65+) 10/31/2019   Influenza Split 09/27/2011   Influenza, High Dose Seasonal PF 08/29/2017, 10/04/2018   Influenza,inj,Quad PF,6+ Mos 07/14/2013, 01/02/2015, 09/10/2016, 08/06/2017   PFIZER(Purple Top)SARS-COV-2 Vaccination 12/03/2019, 12/31/2019, 08/07/2020   Pneumococcal Conjugate-13 04/29/2017   Pneumococcal Polysaccharide-23 09/27/2011, 08/15/2013   Tdap 10/09/2011    Past Medical History:  Diagnosis Date   AAA (abdominal aortic aneurysm) (HCC)    Arthritis    Chest pain 04/17/2016   Cholelithiasis 2011   s/p cholescystectomy    Chronic diastolic congestive heart failure (HCC)    COPD (chronic obstructive pulmonary disease) (HCC)    GERD (gastroesophageal reflux disease)    History of renal cell carcinoma    Hypercholesteremia    Hypertension    Hypertension    Mitral regurgitation    Nausea and vomiting 06/19/2022   Pneumonia 1980's   Renal cell carcinoma 01/2010   s/p right radical nephrectomy 01/2010,  followed by [facility]   S/P minimally invasive mitral valve repair 04/20/2019   Complex valvuloplasty including triangular resection of flail segment of posterior leaflet, artificial Gore-tex neochord placement x6 and 30 mm Sorin Memo 4D ring annuloplasty via right mini thoracotomy approach   Shortness of breath    Tuberculosis    Tests positive for PPD. dad had h/o TB.    Tobacco History: Social History   Tobacco Use  Smoking Status Every Day   Packs/day: 0.50   Years: 53.00   Additional pack years: 0.00   Total pack years: 26.50   Types: Cigarettes   Last attempt to quit: 03/18/2019   Years since quitting: 3.9   Passive exposure: Current (Son who lives with her Smokes)  Smokeless Tobacco Never   Ready to quit: Not Answered Counseling given: Not Answered   Continue to not smoke  Outpatient Encounter Medications as of 03/05/2023  Medication Sig   albuterol  (VENTOLIN HFA) 108 (90 Base) MCG/ACT inhaler Inhale 2 puffs into the lungs every 6 (six) hours as needed for wheezing or shortness of breath.   cetirizine (ZYRTEC ALLERGY) 10 MG tablet Take 1 tablet (  10 mg total) by mouth at bedtime.   ezetimibe (ZETIA) 10 MG tablet Take 10 mg by mouth daily.   fluticasone (FLONASE) 50 MCG/ACT nasal spray Place 1 spray into both nostrils daily.   losartan (COZAAR) 50 MG tablet Take 50 mg by mouth daily.   metoCLOPramide (REGLAN) 10 MG tablet Take 1 tablet (10 mg total) by mouth 4 (four) times daily -  before meals and at bedtime.   metoprolol tartrate (LOPRESSOR) 25 MG tablet Take 1 tablet (25 mg total) by mouth 2 (two) times daily.   oxyCODONE-acetaminophen (PERCOCET/ROXICET) 5-325 MG tablet Take 1 tablet by mouth every 6 (six) hours as needed for severe pain.   pantoprazole (PROTONIX) 40 MG tablet Take 1 tablet (40 mg total) by mouth daily.   predniSONE (DELTASONE) 10 MG tablet Take 4 tablets (40 mg total) by mouth daily with breakfast for 5 days, THEN 2 tablets (20 mg total) daily with breakfast for 5 days, THEN 1 tablet (10 mg total) daily with breakfast for 5 days.   rosuvastatin (CRESTOR) 40 MG tablet Take 40 mg by mouth daily.   sucralfate (CARAFATE) 1 GM/10ML suspension Take 10 mLs (1 g total) by mouth 4 (four) times daily -  with meals and at bedtime.   TRELEGY ELLIPTA 200-62.5-25 MCG/INH AEPB Inhale 1 puff into the lungs daily.   [DISCONTINUED] sertraline (ZOLOFT) 50 MG tablet Take 50 mg by mouth daily.   [DISCONTINUED] acetaminophen (TYLENOL) 500 MG tablet Take 1,000 mg by mouth every 8 (eight) hours as needed for mild pain. (Patient not taking: Reported on 08/18/2022)   [DISCONTINUED] aspirin EC 81 MG tablet Take 1 tablet (81 mg total) by mouth daily. (Patient not taking: Reported on 08/18/2022)   [DISCONTINUED] cephALEXin (KEFLEX) 500 MG capsule Take 1 capsule (500 mg total) by mouth 4 (four) times daily.   [DISCONTINUED] potassium chloride SA (KLOR-CON  M) 20 MEQ tablet Take 2 tablets (40 mEq total) by mouth daily.   No facility-administered encounter medications on file as of 03/05/2023.     Review of Systems  Review of Systems  No chest pain with exertion.  No orthopnea or PND.  Comprehensive review of systems otherwise negative. Physical Exam  BP 126/72   Pulse 73   Ht 5\' 1"  (1.549 m)   Wt 119 lb (54 kg)   SpO2 100% Comment: on RA  BMI 22.48 kg/m   Wt Readings from Last 5 Encounters:  03/05/23 119 lb (54 kg)  11/27/22 116 lb 13.5 oz (53 kg)  11/23/22 117 lb (53.1 kg)  08/18/22 118 lb 6 oz (53.7 kg)  07/19/22 102 lb (46.3 kg)    BMI Readings from Last 5 Encounters:  03/05/23 22.48 kg/m  11/27/22 22.08 kg/m  11/23/22 22.11 kg/m  08/18/22 22.37 kg/m  07/19/22 19.27 kg/m     Physical Exam General: Thin, sitting in chair Eyes: EOMI, no icterus Neck: Supple, no JVP Abdomen: Nondistended, bowel sounds present MSK: No synovitis, no joint effusion Cardiovascular: Warm, no edema Pulmonary: Diffuse and expiratory wheezing throughout, normal work of breathing Neuro: Normal gait, no weakness Psych: Normal mood, full affect   Assessment & Plan:   Acute to subacute bronchitis: Ongoing wheezing, cough, shortness of breath.  Worse than baseline.  Likely worsened by pollens.  Concern for possible COPD/asthma overlap given prolonged exacerbation and wheezing.  Chest x-ray today.  Prolonged prednisone course.  Encouraged to resume inhalers as below.  COPD: Severe in 2020 with hyperinflation and air trapping on lung volumes.  Not on maintenance inhalers currently.  In the past Trelegy has helped.  Recently prescribed a PCP but she had to pick this up.  Samples today.  Encouraged her to resume Trelegy high-dose 1 puff once daily.  Continue albuterol as needed.  Consider phenotyping in the past to evaluate for possible COPD/asthma overlap and targets for biologic therapy.  Pulmonary hypertension: Diagnosed June 2020 with a  mean PA pressure of 25, wedge of 20, preserved cardiac output and index with a PVR less than 1.  Likely commendation of large group 2 disease given elevated wedge and dilated left atrium.  Possible group 3 disease but she is not hypoxemic so this feels less likely.  No role for pulmonary vasodilators.  Return in about 3 months (around 06/05/2023).   Karren Burly, MD 03/05/2023   This appointment required 61 minutes of patient care (this includes precharting, chart review, review of results, face-to-face care, etc.).

## 2023-03-05 NOTE — Patient Instructions (Signed)
It is nice to meet you  Will get a chest x-ray today  You are very wheezy  I am worried about development of asthma given worsening with the pollens recently  Please use Trelegy 1 puff once a day.  Rinse your mouth out after every use.  To help with the wheezing that is worse now, take prednisone as prescribed, 40 mg for 5 days then 20 mg for 5 days, then 10 mg for 5 days then stop  To help with the nasal congestion take Zyrtec 1 pill before bed.  I prescribed this but it may be more cheaper over-the-counter.  Also use Flonase 1 spray each nostril twice a day for 1 week then decrease to 1 spray each nostril once daily.  This is to help with the nasal congestion as well.  Return to clinic in 3 months or sooner as needed with Dr. Judeth Horn

## 2023-03-05 NOTE — Progress Notes (Signed)
COVID and flu was ordered to see if a URI or other cause of her nausea and vomiting which sometimes precedes the respiratory symptoms of URI

## 2023-04-20 ENCOUNTER — Emergency Department (HOSPITAL_COMMUNITY): Payer: Medicare Other

## 2023-04-20 ENCOUNTER — Inpatient Hospital Stay (HOSPITAL_COMMUNITY)
Admission: EM | Admit: 2023-04-20 | Discharge: 2023-04-22 | DRG: 392 | Disposition: A | Discharge status: Home / Self Care | Payer: Medicare Other | Attending: Internal Medicine | Admitting: Internal Medicine

## 2023-04-20 DIAGNOSIS — A084 Viral intestinal infection, unspecified: Principal | ICD-10-CM | POA: Diagnosis present

## 2023-04-20 DIAGNOSIS — I340 Nonrheumatic mitral (valve) insufficiency: Secondary | ICD-10-CM | POA: Diagnosis present

## 2023-04-20 DIAGNOSIS — Z85528 Personal history of other malignant neoplasm of kidney: Secondary | ICD-10-CM

## 2023-04-20 DIAGNOSIS — N1832 Chronic kidney disease, stage 3b: Secondary | ICD-10-CM | POA: Diagnosis present

## 2023-04-20 DIAGNOSIS — Z7951 Long term (current) use of inhaled steroids: Secondary | ICD-10-CM

## 2023-04-20 DIAGNOSIS — Z833 Family history of diabetes mellitus: Secondary | ICD-10-CM

## 2023-04-20 DIAGNOSIS — E860 Dehydration: Secondary | ICD-10-CM | POA: Diagnosis not present

## 2023-04-20 DIAGNOSIS — B880 Other acariasis: Secondary | ICD-10-CM | POA: Diagnosis present

## 2023-04-20 DIAGNOSIS — I5032 Chronic diastolic (congestive) heart failure: Secondary | ICD-10-CM | POA: Diagnosis present

## 2023-04-20 DIAGNOSIS — R112 Nausea with vomiting, unspecified: Secondary | ICD-10-CM | POA: Diagnosis present

## 2023-04-20 DIAGNOSIS — Z1152 Encounter for screening for COVID-19: Secondary | ICD-10-CM

## 2023-04-20 DIAGNOSIS — E7800 Pure hypercholesterolemia, unspecified: Secondary | ICD-10-CM | POA: Diagnosis present

## 2023-04-20 DIAGNOSIS — F17210 Nicotine dependence, cigarettes, uncomplicated: Secondary | ICD-10-CM | POA: Diagnosis present

## 2023-04-20 DIAGNOSIS — I2489 Other forms of acute ischemic heart disease: Secondary | ICD-10-CM | POA: Diagnosis present

## 2023-04-20 DIAGNOSIS — I130 Hypertensive heart and chronic kidney disease with heart failure and stage 1 through stage 4 chronic kidney disease, or unspecified chronic kidney disease: Secondary | ICD-10-CM | POA: Diagnosis present

## 2023-04-20 DIAGNOSIS — Z825 Family history of asthma and other chronic lower respiratory diseases: Secondary | ICD-10-CM

## 2023-04-20 DIAGNOSIS — N179 Acute kidney failure, unspecified: Secondary | ICD-10-CM | POA: Diagnosis present

## 2023-04-20 DIAGNOSIS — K219 Gastro-esophageal reflux disease without esophagitis: Secondary | ICD-10-CM | POA: Diagnosis present

## 2023-04-20 DIAGNOSIS — Z905 Acquired absence of kidney: Secondary | ICD-10-CM

## 2023-04-20 DIAGNOSIS — R9431 Abnormal electrocardiogram [ECG] [EKG]: Secondary | ICD-10-CM | POA: Diagnosis present

## 2023-04-20 DIAGNOSIS — J449 Chronic obstructive pulmonary disease, unspecified: Secondary | ICD-10-CM | POA: Diagnosis present

## 2023-04-20 DIAGNOSIS — Z79899 Other long term (current) drug therapy: Secondary | ICD-10-CM

## 2023-04-20 DIAGNOSIS — I959 Hypotension, unspecified: Secondary | ICD-10-CM | POA: Diagnosis present

## 2023-04-20 LAB — LIPASE, BLOOD: Lipase: 50 U/L (ref 11–51)

## 2023-04-20 LAB — BASIC METABOLIC PANEL
Anion gap: 13 (ref 5–15)
BUN: 63 mg/dL — ABNORMAL HIGH (ref 8–23)
CO2: 23 mmol/L (ref 22–32)
Calcium: 10 mg/dL (ref 8.9–10.3)
Chloride: 99 mmol/L (ref 98–111)
Creatinine, Ser: 1.93 mg/dL — ABNORMAL HIGH (ref 0.44–1.00)
GFR, Estimated: 27 mL/min — ABNORMAL LOW (ref >60)
Glucose, Bld: 153 mg/dL — ABNORMAL HIGH (ref 70–99)
Potassium: 4.4 mmol/L (ref 3.5–5.1)
Sodium: 135 mmol/L (ref 135–145)

## 2023-04-20 LAB — CBG MONITORING, ED: Glucose-Capillary: 141 mg/dL — ABNORMAL HIGH (ref 70–99)

## 2023-04-20 LAB — CBC WITH DIFFERENTIAL/PLATELET
Abs Immature Granulocytes: 0.02 10*3/uL (ref 0.00–0.07)
Basophils Absolute: 0 10*3/uL (ref 0.0–0.1)
Basophils Relative: 0 %
Eosinophils Absolute: 0 10*3/uL (ref 0.0–0.5)
Eosinophils Relative: 0 %
HCT: 45.6 % (ref 36.0–46.0)
Hemoglobin: 15.1 g/dL — ABNORMAL HIGH (ref 12.0–15.0)
Immature Granulocytes: 0 %
Lymphocytes Relative: 9 %
Lymphs Abs: 0.8 10*3/uL (ref 0.7–4.0)
MCH: 29.1 pg (ref 26.0–34.0)
MCHC: 33.1 g/dL (ref 30.0–36.0)
MCV: 87.9 fL (ref 80.0–100.0)
Monocytes Absolute: 0.1 10*3/uL (ref 0.1–1.0)
Monocytes Relative: 2 %
Neutro Abs: 7.8 10*3/uL — ABNORMAL HIGH (ref 1.7–7.7)
Neutrophils Relative %: 89 %
Platelets: 270 10*3/uL (ref 150–400)
RBC: 5.19 MIL/uL — ABNORMAL HIGH (ref 3.87–5.11)
RDW: 12.7 % (ref 11.5–15.5)
WBC: 8.8 10*3/uL (ref 4.0–10.5)
nRBC: 0 % (ref 0.0–0.2)

## 2023-04-20 LAB — URINALYSIS, ROUTINE W REFLEX MICROSCOPIC
Bilirubin Urine: NEGATIVE
Glucose, UA: NEGATIVE mg/dL
Hgb urine dipstick: NEGATIVE
Ketones, ur: NEGATIVE mg/dL
Leukocytes,Ua: NEGATIVE
Nitrite: NEGATIVE
Protein, ur: 30 mg/dL — AB
Specific Gravity, Urine: 1.012 (ref 1.005–1.030)
pH: 6 (ref 5.0–8.0)

## 2023-04-20 LAB — HEPATIC FUNCTION PANEL
ALT: 21 U/L (ref 0–44)
AST: 27 U/L (ref 15–41)
Albumin: 4.4 g/dL (ref 3.5–5.0)
Alkaline Phosphatase: 81 U/L (ref 38–126)
Bilirubin, Direct: 0.1 mg/dL (ref 0.0–0.2)
Indirect Bilirubin: 0.7 mg/dL (ref 0.3–0.9)
Total Bilirubin: 0.8 mg/dL (ref 0.3–1.2)
Total Protein: 8.4 g/dL — ABNORMAL HIGH (ref 6.5–8.1)

## 2023-04-20 LAB — SARS CORONAVIRUS 2 BY RT PCR: SARS Coronavirus 2 by RT PCR: NEGATIVE

## 2023-04-20 LAB — TROPONIN I (HIGH SENSITIVITY)
Troponin I (High Sensitivity): 19 ng/L — ABNORMAL HIGH (ref <18)
Troponin I (High Sensitivity): 20 ng/L — ABNORMAL HIGH (ref <18)

## 2023-04-20 MED ORDER — OXYCODONE HCL 5 MG PO TABS: 5.0000 mg | ORAL_TABLET | ORAL | Status: DC | PRN

## 2023-04-20 MED ORDER — ONDANSETRON HCL 4 MG/2ML IJ SOLN
4.0000 mg | Freq: Once | INTRAMUSCULAR | Status: AC
  Administered 2023-04-20: 4 mg via INTRAVENOUS
  Filled 2023-04-20: qty 2

## 2023-04-20 MED ORDER — NOREPINEPHRINE 4 MG/250ML-% IV SOLN
0.0000 ug/min | INTRAVENOUS | Status: DC
  Filled 2023-04-20: qty 250

## 2023-04-20 MED ORDER — METOPROLOL TARTRATE 25 MG PO TABS
25.0000 mg | ORAL_TABLET | Freq: Once | ORAL | Status: AC
  Administered 2023-04-20: 25 mg via ORAL
  Filled 2023-04-20: qty 1

## 2023-04-20 MED ORDER — ACETAMINOPHEN 500 MG PO TABS
1000.0000 mg | ORAL_TABLET | Freq: Once | ORAL | Status: AC
  Administered 2023-04-20: 1000 mg via ORAL
  Filled 2023-04-20: qty 2

## 2023-04-20 MED ORDER — GLUCAGON HCL RDNA (DIAGNOSTIC) 1 MG IJ SOLR: 5.0000 mg | Freq: Once | INTRAMUSCULAR | Status: AC

## 2023-04-20 MED ORDER — GLUCAGON HCL RDNA (DIAGNOSTIC) 1 MG IJ SOLR
INTRAMUSCULAR | Status: AC
  Administered 2023-04-20: 5 mg via INTRAVENOUS
  Filled 2023-04-20: qty 1

## 2023-04-20 MED ORDER — PANTOPRAZOLE SODIUM 40 MG IV SOLR
40.0000 mg | Freq: Once | INTRAVENOUS | Status: AC
  Administered 2023-04-20: 40 mg via INTRAVENOUS
  Filled 2023-04-20: qty 10

## 2023-04-20 MED ORDER — CLONIDINE HCL 0.2 MG PO TABS
0.2000 mg | ORAL_TABLET | Freq: Once | ORAL | Status: AC
  Administered 2023-04-20: 0.2 mg via ORAL
  Filled 2023-04-20: qty 1

## 2023-04-20 MED ORDER — SODIUM CHLORIDE 0.9 % IV SOLN: Freq: Once | INTRAVENOUS | Status: AC

## 2023-04-20 MED ORDER — LOSARTAN POTASSIUM 50 MG PO TABS
50.0000 mg | ORAL_TABLET | Freq: Once | ORAL | Status: AC
  Administered 2023-04-20: 50 mg via ORAL
  Filled 2023-04-20: qty 1

## 2023-04-20 MED ORDER — SODIUM CHLORIDE 0.9 % IV BOLUS
1000.0000 mL | Freq: Once | INTRAVENOUS | Status: AC
  Administered 2023-04-20: 1000 mL via INTRAVENOUS

## 2023-04-20 MED ORDER — LACTATED RINGERS IV BOLUS
1000.0000 mL | Freq: Once | INTRAVENOUS | Status: AC
  Administered 2023-04-20: 1000 mL via INTRAVENOUS

## 2023-04-20 MED ORDER — SODIUM CHLORIDE 0.9 % IV SOLN
Freq: Once | INTRAVENOUS | Status: AC
  Administered 2023-04-20: 1000 mL via INTRAVENOUS

## 2023-04-20 NOTE — ED Provider Notes (Cosign Needed Addendum)
[place] [facility]ERGENCY DEPARTMENT AT [facility] Provider Note   CSN: [id number] Arrival date & time: 04/20/23  1342     History  Chief Complaint  Patient presents with   Emesis   Chest Pain   Shortness of Breath   Weakness    Katie [name] is a 77 y.o. female with past medical history hypertension, mitral regurgitation, hyperlipidemia, renal cell carcinoma, chronic diastolic heart failure who presents to the ED complaining of nausea and vomiting.  She states that earlier this morning she woke up and felt nauseated so she went to take a medication and has been uncontrollably vomiting since that time.  States that she has had approximately 20 episodes of emesis.  No hematemesis.  Mild associated generalized abdominal pain.  States that she feels slightly short of breath but typically has this at baseline secondary to her history of COPD.  She complained of chest pain the nursing staff but denied this to me.  1 episode of diarrhea this morning.  Again no blood in this either.  No recent fever.  No urinary symptoms.  Seen by GI in the fall 2023 for issues with chronic nausea and vomiting.  At that time, they wanted her to follow-up for additional testing but it does not appear as if she was seen in the clinic again.      Home Medications Prior to Admission medications   Medication Sig Start Date End Date Taking? Authorizing Provider  albuterol (VENTOLIN HFA) 108 (90 Base) MCG/ACT inhaler Inhale 2 puffs into the lungs every 6 (six) hours as needed for wheezing or shortness of breath. 02/08/20   Waldon Merl, PA-C  cetirizine (ZYRTEC ALLERGY) 10 MG tablet Take 1 tablet (10 mg total) by mouth at bedtime. 03/05/23   Hunsucker, Lesia Sago, MD  ezetimibe (ZETIA) 10 MG tablet Take 10 mg by mouth daily. 06/11/22   [provider]  fluticasone (FLONASE) 50 MCG/ACT nasal spray Place 1 spray into both nostrils daily. 03/05/23   Hunsucker, Lesia Sago, MD  Fluticasone-Umeclidin-Vilant  (TRELEGY ELLIPTA) 200-62.5-25 MCG/ACT AEPB Inhale 1 puff into the lungs daily. 03/05/23   Hunsucker, Lesia Sago, MD  losartan (COZAAR) 50 MG tablet Take 50 mg by mouth daily.    [provider]  metoCLOPramide (REGLAN) 10 MG tablet Take 1 tablet (10 mg total) by mouth 4 (four) times daily -  before meals and at bedtime. 06/20/22   Mapp, Gaylyn Cheers, MD  metoprolol tartrate (LOPRESSOR) 25 MG tablet Take 1 tablet (25 mg total) by mouth 2 (two) times daily. 04/23/21   Worthy Rancher B, FNP  oxyCODONE-acetaminophen (PERCOCET/ROXICET) 5-325 MG tablet Take 1 tablet by mouth every 6 (six) hours as needed for severe pain. 11/23/22   Vanetta Mulders, MD  pantoprazole (PROTONIX) 40 MG tablet Take 1 tablet (40 mg total) by mouth daily. 08/18/22   Zehr, Princella Pellegrini, PA-C  rosuvastatin (CRESTOR) 40 MG tablet Take 40 mg by mouth daily. 06/11/22   [provider]  sucralfate (CARAFATE) 1 GM/10ML suspension Take 10 mLs (1 g total) by mouth 4 (four) times daily -  with meals and at bedtime. 06/20/22   Mapp, Gaylyn Cheers, MD  TRELEGY ELLIPTA 200-62.5-25 MCG/INH AEPB Inhale 1 puff into the lungs daily. 07/17/21   [provider]      Allergies    Patient has no known allergies.    Review of Systems   Review of Systems  All other systems reviewed and are negative.   Physical Exam Updated Vital  Signs BP (!) 199/113   Pulse 79   Temp 97.7 F (36.5 C) (Oral)   Resp (!) 21   Ht 5\' 1"  (1.549 m)   Wt 52.2 kg   SpO2 99%   BMI 21.73 kg/m  Physical Exam Vitals and nursing note reviewed.  Constitutional:      General: She is in acute distress (mild secondary to nausea).     Appearance: Normal appearance. She is not ill-appearing or toxic-appearing.  HENT:     Head: Normocephalic and atraumatic.     Mouth/Throat:     Mouth: Mucous membranes are dry.  Eyes:     Extraocular Movements: Extraocular movements intact.     Conjunctiva/sclera: Conjunctivae normal.  Neck:     Vascular: No JVD.   Cardiovascular:     Rate and Rhythm: Normal rate and regular rhythm.     Heart sounds: No murmur heard.    No S3 or S4 sounds.  Pulmonary:     Effort: Pulmonary effort is normal. No tachypnea or respiratory distress.     Breath sounds: Normal breath sounds. No stridor. No decreased breath sounds, wheezing, rhonchi or rales.  Abdominal:     General: Abdomen is flat.     Palpations: Abdomen is soft. There is no mass.     Tenderness: There is abdominal tenderness. There is no guarding or rebound.  Musculoskeletal:        General: Normal range of motion.     Cervical back: Normal range of motion and neck supple.     Right lower leg: No tenderness. No edema.     Left lower leg: No tenderness. No edema.  Skin:    General: Skin is warm and dry.     Capillary Refill: Capillary refill takes less than 2 seconds.  Neurological:     General: No focal deficit present.     Mental Status: She is alert and oriented to person, place, and time. Mental status is at baseline.  Psychiatric:        Mood and Affect: Mood normal.        Behavior: Behavior normal.     ED Results / Procedures / Treatments   Labs (all labs ordered are listed, but only abnormal results are displayed) Labs Reviewed  BASIC METABOLIC PANEL - Abnormal; Notable for the following components:      Result Value   Glucose, Bld 153 (*)    BUN 63 (*)    Creatinine, Ser 1.93 (*)    GFR, Estimated 27 (*)    All other components within normal limits  URINALYSIS, ROUTINE W REFLEX MICROSCOPIC - Abnormal; Notable for the following components:   Protein, ur 30 (*)    Bacteria, UA RARE (*)    All other components within normal limits  CBC WITH DIFFERENTIAL/PLATELET - Abnormal; Notable for the following components:   RBC 5.19 (*)    Hemoglobin 15.1 (*)    Neutro Abs 7.8 (*)    All other components within normal limits  HEPATIC FUNCTION PANEL - Abnormal; Notable for the following components:   Total Protein 8.4 (*)    All other  components within normal limits  CBG MONITORING, ED - Abnormal; Notable for the following components:   Glucose-Capillary 141 (*)    All other components within normal limits  TROPONIN I (HIGH SENSITIVITY) - Abnormal; Notable for the following components:   Troponin I (High Sensitivity) 19 (*)    All other components within normal limits  TROPONIN I (  HIGH SENSITIVITY) - Abnormal; Notable for the following components:   Troponin I (High Sensitivity) 20 (*)    All other components within normal limits  SARS CORONAVIRUS 2 BY RT PCR  LIPASE, BLOOD    EKG EKG Interpretation Date/Time:  Monday April 20 2023 15:03:04 EDT Ventricular Rate:  75 PR Interval:  134 QRS Duration:  86 QT Interval:  590 QTC Calculation: 658 R Axis:   40  Text Interpretation: Normal sinus rhythm Low voltage QRS Nonspecific T wave abnormality Abnormal ECG When compared with ECG of 27-Nov-2022 08:03,  QRS smaller Confirmed by Benjiman Core [phone]) on 04/20/2023 3:47:02 PM  Radiology DG Chest 2 View  Result Date: 04/20/2023 CLINICAL DATA:  Chest pain EXAM: CHEST - 2 VIEW COMPARISON:  X-ray 03/05/2019 and older.  CT angiogram 11/27/2018 FINDINGS: Prosthetic valve. Curvature of the spine. Normal cardiopericardial silhouette. No pneumothorax, effusion or edema. There is some focal opacity along the middle lobe. Surgical changes in the upper abdomen. IMPRESSION: Focal opacity along the middle lobe. Possible infiltrate versus atelectasis. Recommend follow-up. Electronically Signed   By: Karen Kays M.D.   On: 04/20/2023 15:35    Procedures Procedures    Medications Ordered in ED Medications  ondansetron (ZOFRAN) injection 4 mg (4 mg Intravenous Given 04/20/23 1553)  pantoprazole (PROTONIX) injection 40 mg (40 mg Intravenous Given 04/20/23 1553)  lactated ringers bolus 1,000 mL (0 mLs Intravenous Stopped 04/20/23 1655)  cloNIDine (CATAPRES) tablet 0.2 mg (0.2 mg Oral Given 04/20/23 1700)    ED Course/ Medical Decision  Making/ A&P Clinical Course as of 04/20/23 2153  Mon Apr 20, 2023  1947 Patient was given clonidine initially for BP and subsequently her home meds as she stated that she vomited them this AM. Her BP overcorrected into the 70s systolic. Gave bolus of fluid which improved her pressures into 80s but will need further increase again. D/w pharmacy who recommended glucagon to help reverse beta blocker. Will give zofran to pre-treat w/ glucagon. EKG reading QTC prolonged but measured with calipers appears to be 350 ms. If unsuccessful will need to start levo. [HN]  2021 Pharmacy administered glucagon given at 1958, BP was 107/69. No emesis. Patient felt improved. BP now 86/57 again. Will give additional fluid bolus and reevaluate.  [HN]  2042 Gave another 1L fluid resuscitation. BP improved to 107/60s, but now is 77 systolic again. Will start levophed and consult ICU. [HN]  2100 D/w ICU who recommended a 3rd liter of fluid for a total of 3.5L and then to start levo if still hypotensive. [HN]  2145 After 3.5 L fluid resuscitation, BPs now holding steady. Pt taking PO. D/w pharmacy, clonidine/losartan will last 6-8 hours, but metoprolol will last >12 hours. Will need to be observed inpatient. Will consult to hospitalist. [HN]    Clinical Course User Index [HN] Loetta Rough, MD                             Medical Decision Making Amount and/or Complexity of Data Reviewed Labs: ordered. Decision-making details documented in ED Course. Radiology: ordered. Decision-making details documented in ED Course. ECG/medicine tests: ordered. Decision-making details documented in ED Course.  Risk OTC drugs. Prescription drug management. Decision regarding hospitalization.   Medical Decision Making:   Aiyah Maggert is a 77 y.o. female who presented to the ED today with vomiting / abdominal pain detailed above.    Patient's presentation is complicated by their history of COPD,  renal cell carcinoma,  hypertension, hyperlipidemia, chronic nausea and vomiting.  Patient placed on continuous vitals and telemetry monitoring while in ED which was reviewed periodically.  Complete initial physical exam performed, notably the patient  was in mild stress secondary to nausea.  She had diffuse mild abdominal tenderness but abdomen soft and nondistended.  No CVA tenderness.  Appeared slightly dehydrated.  No edema to the abdomen or lower extremities.    Reviewed and confirmed nursing documentation for past medical history, family history, social history.    Initial Assessment:   With the patient's presentation of vomiting / abdominal pain, differential diagnosis includes but is not limited to AAA, mesenteric ischemia, appendicitis, diverticulitis, DKA, gastritis, gastroenteritis, AMI, nephrolithiasis, pancreatitis, peritonitis, adrenal insufficiency, intestinal ischemia, constipation, UTI, SBO/LBO, splenic rupture, biliary disease, IBD, IBS, PUD, hepatitis, STD, ovarian/testicular torsion, electrolyte disturbance, DKA, dehydration, acute kidney injury, renal failure, cholecystitis, cholelithiasis, choledocholithiasis, abdominal pain of  unknown etiology.   Initial Plan:  Screening labs including CBC and Metabolic panel to evaluate for infectious or metabolic etiology of disease.  Lipase to evaluate for pancreatitis Urinalysis with reflex culture ordered to evaluate for UTI or relevant urologic/nephrologic pathology.  CT abd/pelvis to evaluate for intra-abdominal pathology EKG and troponin to evaluate for cardiac pathology COVID to assess for viral etiology Symptomatic management Objective evaluation as reviewed   Initial Study Results:   Laboratory  All laboratory results reviewed without evidence of clinically relevant pathology.   Exceptions include: Hgb 15.1, Cr 1.93, troponin 19-20  EKG EKG was reviewed independently. NSR at 75 bpm.   Radiology:  All images reviewed independently. Agree with  radiology report at this time.   CT ABDOMEN PELVIS WO CONTRAST  Result Date: 04/20/2023 CLINICAL DATA:  Abdominal pain. EXAM: CT ABDOMEN AND PELVIS WITHOUT CONTRAST TECHNIQUE: Multidetector CT imaging of the abdomen and pelvis was performed following the standard protocol without IV contrast. RADIATION DOSE REDUCTION: This exam was performed according to the departmental dose-optimization program which includes automated exposure control, adjustment of the mA and/or kV according to patient size and/or use of iterative reconstruction technique. COMPARISON:  CT of the chest abdomen pelvis dated 11/27/2022. FINDINGS: Evaluation of this exam is limited in the absence of intravenous contrast. Lower chest: Pleural base calcification at the right lung base. The visualized lung bases are otherwise clear. There is coronary vascular calcification. Partially visualized indeterminate 2 cm structure at the right cardiophrenic angle, seen on the prior CT and demonstrates fluid attenuation, likely a cyst. No intra-abdominal free air or free fluid. Hepatobiliary: Several small liver cyst and additional subcentimeter hypodense lesions which are too small to characterize. No biliary dilatation. Cholecystectomy. No retained calcified stone noted in the central CBD. Pancreas: Unremarkable. No pancreatic ductal dilatation or surrounding inflammatory changes. Spleen: Normal in size without focal abnormality. Adrenals/Urinary Tract: Status post prior right nephrectomy. No suspicious lesion noted in the nephrectomy bed. The right adrenal gland is not visualized. The left adrenal gland is grossly unremarkable. There is no hydronephrosis or nephrolithiasis on the left. The left ureter and urinary bladder appear unremarkable. Stomach/Bowel: There is no bowel obstruction or active inflammation. Scattered distal colonic diverticula without active inflammatory changes. The appendix is not visualized with certainty. No inflammatory changes  identified in the right lower quadrant. Vascular/Lymphatic: Moderate aortoiliac atherosclerotic disease. There is a 4.3 cm infrarenal abdominal aortic aneurysm status post aorto bi iliac endovascular stent graft repair. The IVC is unremarkable. No portal venous gas. There is no adenopathy. Reproductive: The uterus is grossly unremarkable.  No adnexal masses. Other: None Musculoskeletal: Osteopenia with degenerative changes of the spine. Old L3 compression fracture. No acute osseous pathology. IMPRESSION: 1. No acute intra-abdominal or pelvic pathology. 2. Status post prior right nephrectomy. 3. Colonic diverticulosis. No bowel obstruction. 4.  Aortic Atherosclerosis (ICD10-I70.0). Electronically Signed   By: Elgie Collard M.D.   On: 04/20/2023 18:21   DG Chest 2 View  Result Date: 04/20/2023 CLINICAL DATA:  Chest pain EXAM: CHEST - 2 VIEW COMPARISON:  X-ray 03/05/2019 and older.  CT angiogram 11/27/2018 FINDINGS: Prosthetic valve. Curvature of the spine. Normal cardiopericardial silhouette. No pneumothorax, effusion or edema. There is some focal opacity along the middle lobe. Surgical changes in the upper abdomen. IMPRESSION: Focal opacity along the middle lobe. Possible infiltrate versus atelectasis. Recommend follow-up. Electronically Signed   By: Karen Kays M.D.   On: 04/20/2023 15:35      Consults: Case discussed with hospitalist who accepts admission.   Final Assessment and Plan:   77 year old female presenting to the ED for nausea and vomiting onset this morning.  Initially with good nausea control.  No active emesis in the ED.  Does have an extensive medical history so imaging was obtained.  No acute findings on CT abdomen pelvis.  Slight bump in creatinine though suspect this is secondary to dehydration.  Does have a history of heart failure though last EF is 65 to 70%.  Does not appear to be volume overloaded on exam.  Lungs clear to auscultation.  Some subjective shortness of breath but states  that this is secondary to her history of COPD.  No wheezing.  No signs of respiratory distress.  Initially patient with good nausea control, abdomen soft and nontender on reexam, but does remain persistently hypertensive.  She noted that she believes that she vomited up her blood pressure medications when she attempted to take them this morning.  She was given a dose of clonidine but blood pressure remained persistently elevated.  With this, opted to give patient her home medications as well for further blood pressure control.  Initially slow with IV fluids due to history of heart failure.  Approximately 45 minutes following dose of home blood pressure medications, patient became hypotensive.  She complains of some minimal lightheadedness and dizziness but was alert and oriented.  Nausea remains improved.  Attempted to improve blood pressure with fluid boluses but only had minimal success in doing so. Attending physician discussed with pharmacy who recommended glucagon to reverse beta-blocker and improved blood pressure.  Some minimal improvement following this but patient did become hypotensive again. Care was signed out to attending physician for remainder of care during ED stay he did the interventions as above including consulting intensivist.  After fluid resuscitation, patient able to maintain blood pressure and did not require Levophed.  Patient admitted to hospitalist.   Clinical Impression:  1. Dehydration   2. Nausea and vomiting, unspecified vomiting type   3. Hypotension, unspecified hypotension type      Admit           Final Clinical Impression(s) / ED Diagnoses Final diagnoses:  Dehydration  Nausea and vomiting, unspecified vomiting type  Hypotension, unspecified hypotension type    Rx / DC Orders ED Discharge Orders     None         Tonette Lederer, PA-C 04/20/23 2240    Rupa Lagan, Lawrence [place]se[place], PA-C 04/20/23 2242    Loetta Rough, MD 04/21/23 [phone]

## 2023-04-20 NOTE — ED Triage Notes (Signed)
Pt to ED c/o n/v that started today around 3 am. Pt also c/o generalized body pain , abdominal pain. Pt also c/o chest pain and SHOB, reports she has this all the time d/t COPD

## 2023-04-21 ENCOUNTER — Other Ambulatory Visit: Payer: Self-pay

## 2023-04-21 ENCOUNTER — Encounter (HOSPITAL_COMMUNITY): Payer: Self-pay | Admitting: Internal Medicine

## 2023-04-21 DIAGNOSIS — Z833 Family history of diabetes mellitus: Secondary | ICD-10-CM | POA: Diagnosis not present

## 2023-04-21 DIAGNOSIS — R9431 Abnormal electrocardiogram [ECG] [EKG]: Secondary | ICD-10-CM | POA: Diagnosis present

## 2023-04-21 DIAGNOSIS — I959 Hypotension, unspecified: Secondary | ICD-10-CM | POA: Diagnosis present

## 2023-04-21 DIAGNOSIS — N1832 Chronic kidney disease, stage 3b: Secondary | ICD-10-CM | POA: Diagnosis present

## 2023-04-21 DIAGNOSIS — Z905 Acquired absence of kidney: Secondary | ICD-10-CM | POA: Diagnosis not present

## 2023-04-21 DIAGNOSIS — I340 Nonrheumatic mitral (valve) insufficiency: Secondary | ICD-10-CM | POA: Diagnosis present

## 2023-04-21 DIAGNOSIS — K219 Gastro-esophageal reflux disease without esophagitis: Secondary | ICD-10-CM | POA: Diagnosis present

## 2023-04-21 DIAGNOSIS — B880 Other acariasis: Secondary | ICD-10-CM | POA: Diagnosis present

## 2023-04-21 DIAGNOSIS — Z1152 Encounter for screening for COVID-19: Secondary | ICD-10-CM | POA: Diagnosis not present

## 2023-04-21 DIAGNOSIS — A084 Viral intestinal infection, unspecified: Secondary | ICD-10-CM | POA: Diagnosis present

## 2023-04-21 DIAGNOSIS — F17210 Nicotine dependence, cigarettes, uncomplicated: Secondary | ICD-10-CM | POA: Diagnosis present

## 2023-04-21 DIAGNOSIS — I5032 Chronic diastolic (congestive) heart failure: Secondary | ICD-10-CM | POA: Diagnosis present

## 2023-04-21 DIAGNOSIS — F502 Bulimia nervosa: Secondary | ICD-10-CM | POA: Diagnosis not present

## 2023-04-21 DIAGNOSIS — J449 Chronic obstructive pulmonary disease, unspecified: Secondary | ICD-10-CM | POA: Diagnosis present

## 2023-04-21 DIAGNOSIS — I2489 Other forms of acute ischemic heart disease: Secondary | ICD-10-CM | POA: Diagnosis present

## 2023-04-21 DIAGNOSIS — E860 Dehydration: Secondary | ICD-10-CM | POA: Diagnosis present

## 2023-04-21 DIAGNOSIS — E7800 Pure hypercholesterolemia, unspecified: Secondary | ICD-10-CM | POA: Diagnosis present

## 2023-04-21 DIAGNOSIS — I130 Hypertensive heart and chronic kidney disease with heart failure and stage 1 through stage 4 chronic kidney disease, or unspecified chronic kidney disease: Secondary | ICD-10-CM | POA: Diagnosis present

## 2023-04-21 DIAGNOSIS — Z7951 Long term (current) use of inhaled steroids: Secondary | ICD-10-CM | POA: Diagnosis not present

## 2023-04-21 DIAGNOSIS — R112 Nausea with vomiting, unspecified: Secondary | ICD-10-CM | POA: Diagnosis present

## 2023-04-21 DIAGNOSIS — Z825 Family history of asthma and other chronic lower respiratory diseases: Secondary | ICD-10-CM | POA: Diagnosis not present

## 2023-04-21 DIAGNOSIS — Z79899 Other long term (current) drug therapy: Secondary | ICD-10-CM | POA: Diagnosis not present

## 2023-04-21 DIAGNOSIS — N179 Acute kidney failure, unspecified: Secondary | ICD-10-CM | POA: Diagnosis present

## 2023-04-21 DIAGNOSIS — Z85528 Personal history of other malignant neoplasm of kidney: Secondary | ICD-10-CM | POA: Diagnosis not present

## 2023-04-21 LAB — CBC
HCT: 34.3 % — ABNORMAL LOW (ref 36.0–46.0)
Hemoglobin: 11.1 g/dL — ABNORMAL LOW (ref 12.0–15.0)
MCH: 28.8 pg (ref 26.0–34.0)
MCHC: 32.4 g/dL (ref 30.0–36.0)
MCV: 88.9 fL (ref 80.0–100.0)
Platelets: 178 10*3/uL (ref 150–400)
RBC: 3.86 MIL/uL — ABNORMAL LOW (ref 3.87–5.11)
RDW: 13 % (ref 11.5–15.5)
WBC: 6.5 10*3/uL (ref 4.0–10.5)
nRBC: 0 % (ref 0.0–0.2)

## 2023-04-21 LAB — CREATININE, SERUM
Creatinine, Ser: 1.42 mg/dL — ABNORMAL HIGH (ref 0.44–1.00)
GFR, Estimated: 39 mL/min — ABNORMAL LOW (ref >60)

## 2023-04-21 LAB — BASIC METABOLIC PANEL
Anion gap: 7 (ref 5–15)
BUN: 37 mg/dL — ABNORMAL HIGH (ref 8–23)
CO2: 23 mmol/L (ref 22–32)
Calcium: 8.4 mg/dL — ABNORMAL LOW (ref 8.9–10.3)
Chloride: 108 mmol/L (ref 98–111)
Creatinine, Ser: 1.44 mg/dL — ABNORMAL HIGH (ref 0.44–1.00)
GFR, Estimated: 38 mL/min — ABNORMAL LOW (ref >60)
Glucose, Bld: 88 mg/dL (ref 70–99)
Potassium: 4.1 mmol/L (ref 3.5–5.1)
Sodium: 138 mmol/L (ref 135–145)

## 2023-04-21 LAB — MAGNESIUM: Magnesium: 1.9 mg/dL (ref 1.7–2.4)

## 2023-04-21 MED ORDER — CETAPHIL MOISTURIZING EX LOTN
TOPICAL_LOTION | Freq: Every day | CUTANEOUS | Status: DC
  Filled 2023-04-21: qty 473

## 2023-04-21 MED ORDER — ALBUTEROL SULFATE (2.5 MG/3ML) 0.083% IN NEBU: 3.0000 mL | INHALATION_SOLUTION | Freq: Four times a day (QID) | RESPIRATORY_TRACT | Status: DC | PRN

## 2023-04-21 MED ORDER — FLUTICASONE FUROATE-VILANTEROL 200-25 MCG/ACT IN AEPB
1.0000 | INHALATION_SPRAY | Freq: Every day | RESPIRATORY_TRACT | Status: DC
  Administered 2023-04-21 – 2023-04-22 (×2): 1 via RESPIRATORY_TRACT
  Filled 2023-04-21: qty 28

## 2023-04-21 MED ORDER — ACETAMINOPHEN 325 MG PO TABS
650.0000 mg | ORAL_TABLET | Freq: Four times a day (QID) | ORAL | Status: DC | PRN
  Administered 2023-04-21: 650 mg via ORAL
  Filled 2023-04-21: qty 2

## 2023-04-21 MED ORDER — SODIUM CHLORIDE 0.9 % IV SOLN: INTRAVENOUS | Status: AC

## 2023-04-21 MED ORDER — PANTOPRAZOLE SODIUM 40 MG PO TBEC
40.0000 mg | DELAYED_RELEASE_TABLET | Freq: Every day | ORAL | Status: DC
  Administered 2023-04-21 – 2023-04-22 (×2): 40 mg via ORAL
  Filled 2023-04-21 (×2): qty 1

## 2023-04-21 MED ORDER — CALAMINE EX LOTN
1.0000 | TOPICAL_LOTION | CUTANEOUS | Status: DC | PRN
  Administered 2023-04-21: 1 via TOPICAL
  Filled 2023-04-21: qty 177

## 2023-04-21 MED ORDER — ROSUVASTATIN CALCIUM 20 MG PO TABS
40.0000 mg | ORAL_TABLET | Freq: Every day | ORAL | Status: DC
  Administered 2023-04-21 – 2023-04-22 (×2): 40 mg via ORAL
  Filled 2023-04-21 (×2): qty 2

## 2023-04-21 MED ORDER — POLYETHYLENE GLYCOL 3350 17 G PO PACK: 17.0000 g | PACK | Freq: Every day | ORAL | Status: DC | PRN

## 2023-04-21 MED ORDER — PROCHLORPERAZINE EDISYLATE 10 MG/2ML IJ SOLN: 5.0000 mg | Freq: Four times a day (QID) | INTRAMUSCULAR | Status: DC | PRN

## 2023-04-21 MED ORDER — HYDROXYZINE HCL 25 MG PO TABS
25.0000 mg | ORAL_TABLET | Freq: Three times a day (TID) | ORAL | Status: DC | PRN
  Administered 2023-04-21 – 2023-04-22 (×2): 25 mg via ORAL
  Filled 2023-04-21 (×2): qty 1

## 2023-04-21 MED ORDER — ENOXAPARIN SODIUM 30 MG/0.3ML IJ SOSY
30.0000 mg | PREFILLED_SYRINGE | INTRAMUSCULAR | Status: DC
  Filled 2023-04-21: qty 0.3

## 2023-04-21 MED ORDER — UMECLIDINIUM BROMIDE 62.5 MCG/ACT IN AEPB
1.0000 | INHALATION_SPRAY | Freq: Every day | RESPIRATORY_TRACT | Status: DC
  Administered 2023-04-21 – 2023-04-22 (×2): 1 via RESPIRATORY_TRACT
  Filled 2023-04-21: qty 7

## 2023-04-21 MED ORDER — EZETIMIBE 10 MG PO TABS
10.0000 mg | ORAL_TABLET | Freq: Every day | ORAL | Status: DC
  Administered 2023-04-21 – 2023-04-22 (×2): 10 mg via ORAL
  Filled 2023-04-21 (×2): qty 1

## 2023-04-21 MED ORDER — MELATONIN 5 MG PO TABS: 5.0000 mg | ORAL_TABLET | Freq: Every evening | ORAL | Status: DC | PRN

## 2023-04-21 NOTE — ED Notes (Signed)
Assumed care of patient relocated due to nursing staffing issues. Patient pending observation for n/v and hypotension. Patient ambulates with steady gait. Patient a/o x 4 respirations even and non labored emesis has resolved and pt no longer hypotensive.

## 2023-04-21 NOTE — Progress Notes (Addendum)
Same day note  Patient seen and examined at bedside.  Patient was admitted to the hospital for nausea and vomiting  At the time of my evaluation, patient complains of feeling little better after this morning.  Was able to tolerate some p.o.  Physical examination reveals elderly female, not in obvious distress.  No abdominal tenderness.  Laboratory data and imaging was reviewed  Assessment and Plan.  Intractable nausea and vomiting possibly from viral gastroenteritis. CT scan of the abdomen negative, lipase was negative.  Continue supportive care IV fluids antiemetics.  Slight improvement.  Advance diet as tolerated.  Hypotension, likely iatrogenic Initially severely hypertensive.  Became hypotensive with MAP in the 50s after resuming her oral blood pressure medications Will continue to monitor.   Elevated troponin, suspect demand ischemia in the setting of hypotension. High-sensitivity troponin flat 19, repeat 20 No chest pain.  Continue telemetry   Prolonged Qtc QTc on admission 12 lead EKG 658..  Monitor electrolytes closely.   AKI on CKD 3b Likely secondary to nausea vomiting.  Baseline creatinine around 1.6.  Creatinine on presentation at 1.9.  Creatinine has improved to 1.4 today.   COPD Resume home regimen  Pruritus.  Generalized.  Dry skin.  Will add Cetaphil cream, calamine lotion and as needed Atarax.  No Charge  Signed,  Tenny Craw, MD [facility]

## 2023-04-21 NOTE — H&P (Addendum)
History and Physical  Katie [name] ZOX:[id number] DOB: March 21, 1946 DOA: 04/20/2023  Referring physician: Dr. Jearld Fenton, EDP  PCP: Ellyn Hack, MD  Outpatient Specialists: GI, Pulmonology Patient coming from: Home.  Chief Complaint: Nausea and vomiting.  HPI: Katie [name] is a 77 y.o. female with medical history significant for hypertension, mitral regurgitation, hyperlipidemia, renal cell carcinoma, chronic HFpEF, who presented with nausea and vomiting that started this morning.  She had about 20 episodes of vomiting today.  Associated with mild generalized abdominal pain.  No hematemesis.  No reported subjective fevers or chills.  Upon presentation to the ED, she was noted to be severely hypertensive with SBP's of 215 and DBP of 119.  The patient received a dose of clonidine with her home doses Lopressor and losartan.  Her blood pressure dropped significantly to 84/54 with MAP of 64 then again to 76/47 with a MAP of 57.  The patient received IV glucagon to reverse her metoprolol.  She also received 3 L IV fluid bolus with improvement.  Vasopressors were not started.  IV antiemetics were administered with improvement of her nausea.  TRH, hospitalist service, was asked to admit for blood pressure monitoring.  Admitted to telemetry medical unit as observation status.  ED Course: Tmax 97.8.  BP 108/79, pulse 70, respiratory 18, saturation 98% on room air.  Remarkable for serum glucose 153, BUN 63, creatinine 1.93, GFR 27.  High-sensitivity troponin 19 repeat 20.  UA was negative for pyuria.  Review of Systems: Review of systems as noted in the HPI. All other systems reviewed and are negative.   Past Medical History:  Diagnosis Date   AAA (abdominal aortic aneurysm) (HCC)    Arthritis    Chest pain 04/17/2016   Cholelithiasis 2011   s/p cholescystectomy    Chronic diastolic congestive heart failure (HCC)    COPD (chronic obstructive pulmonary disease) (HCC)    GERD (gastroesophageal  reflux disease)    History of renal cell carcinoma    Hypercholesteremia    Hypertension    Hypertension    Mitral regurgitation    Nausea and vomiting 06/19/2022   Pneumonia 1980's   Renal cell carcinoma 01/2010   s/p right radical nephrectomy 01/2010,  followed by [facility]   S/P minimally invasive mitral valve repair 04/20/2019   Complex valvuloplasty including triangular resection of flail segment of posterior leaflet, artificial Gore-tex neochord placement x6 and 30 mm Sorin Memo 4D ring annuloplasty via right mini thoracotomy approach   Shortness of breath    Tuberculosis    Tests positive for PPD. dad had h/o TB.   Past Surgical History:  Procedure Laterality Date   ABDOMINAL AORTIC ENDOVASCULAR STENT GRAFT N/A 01/10/2022   Procedure: ABDOMINAL AORTIC ENDOVASCULAR STENT GRAFT;  Surgeon: Cephus Shelling, MD;  Location: [facility] OR;  Service: Vascular;  Laterality: N/A;   APPENDECTOMY  1970's   BUBBLE STUDY  03/18/2019   Procedure: BUBBLE STUDY;  Surgeon: Pricilla Riffle, MD;  Location: [facility] ENDOSCOPY;  Service: Cardiovascular;;   CARDIAC CATHETERIZATION  09/2011   CHOLECYSTECTOMY  01/2010   DIAGNOSTIC LAPAROSCOPY     KIDNEY SURGERY     LEFT AND RIGHT HEART CATHETERIZATION WITH CORONARY ANGIOGRAM N/A 09/30/2011   Procedure: LEFT AND RIGHT HEART CATHETERIZATION WITH CORONARY ANGIOGRAM;  Surgeon: Donato Schultz, MD;  Location: [facility] CATH LAB;  Service: Cardiovascular;  Laterality: N/A;   MITRAL VALVE REPAIR Right 04/20/2019   Procedure: MINIMALLY INVASIVE MITRAL VALVE REPAIR (MVR) USING MEMO 4D SIZE 30;  Surgeon: Purcell Nails, MD;  Location: [facility] OR;  Service: Open Heart Surgery;  Laterality: Right;   MULTIPLE EXTRACTIONS WITH ALVEOLOPLASTY  10/06/2011   Procedure: MULTIPLE EXTRACION WITH ALVEOLOPLASTY;  Surgeon: Charlynne Pander, DDS;  Location: [facility] OR;  Service: Oral Surgery;  Laterality: N/A;  Multiple extraction of tooth #'s 1, 6, 8, 10, 11, 22, 23, 26, 27, 28, 29 with alveoloplasty  and Upper right buccal exostoses reductions.   NEPHRECTOMY RADICAL  01/2010   right    ORIF WRIST FRACTURE Right 02/17/2022   Procedure: OPEN REDUCTION INTERNAL FIXATION (ORIF) WRIST FRACTURE;  Surgeon: Bradly Bienenstock, MD;  Location: [facility] OR;  Service: Orthopedics;  Laterality: Right;  with IV sedation   OVARIAN CYST REMOVAL  1970's   "went through belly button"   RIGHT/LEFT HEART CATH AND CORONARY ANGIOGRAPHY N/A 03/22/2019   Procedure: RIGHT/LEFT HEART CATH AND CORONARY ANGIOGRAPHY;  Surgeon: Corky Crafts, MD;  Location: [facility] INVASIVE CV LAB;  Service: Cardiovascular;  Laterality: N/A;   TEE WITHOUT CARDIOVERSION  10/01/2011   Procedure: TRANSESOPHAGEAL ECHOCARDIOGRAM (TEE);  Surgeon: Corky Crafts;  Location: [facility] ENDOSCOPY;  Service: Cardiovascular;  Laterality: N/A;   TEE WITHOUT CARDIOVERSION N/A 03/18/2019   Procedure: TRANSESOPHAGEAL ECHOCARDIOGRAM (TEE);  Surgeon: Pricilla Riffle, MD;  Location: [facility] ENDOSCOPY;  Service: Cardiovascular;  Laterality: N/A;   TEE WITHOUT CARDIOVERSION N/A 04/20/2019   Procedure: TRANSESOPHAGEAL ECHOCARDIOGRAM (TEE);  Surgeon: Purcell Nails, MD;  Location: [facility] OR;  Service: Open Heart Surgery;  Laterality: N/A;   TUBAL LIGATION  1970's   US ECHOCARDIOGRAPHY  09/2011    Social History:  reports that she has been smoking cigarettes. She has a 26.50 pack-year smoking history. She has been exposed to tobacco smoke. She has never used smokeless tobacco. She reports that she does not currently use alcohol. She reports that she does not currently use drugs after having used the following drugs: Cocaine.   No Known Allergies  Family History  Problem Relation Age of Onset   COPD Mother        DECEASED/SMOKED   Diabetes Brother    Diabetes Brother    Colon cancer Neg Hx    Rectal cancer Neg Hx    Esophageal cancer Neg Hx    Stomach cancer Neg Hx       Prior to Admission medications   Medication Sig Start Date End Date Taking? Authorizing Provider   albuterol (VENTOLIN HFA) 108 (90 Base) MCG/ACT inhaler Inhale 2 puffs into the lungs every 6 (six) hours as needed for wheezing or shortness of breath. 02/08/20   Waldon Merl, PA-C  cetirizine (ZYRTEC ALLERGY) 10 MG tablet Take 1 tablet (10 mg total) by mouth at bedtime. 03/05/23   Hunsucker, Lesia Sago, MD  ezetimibe (ZETIA) 10 MG tablet Take 10 mg by mouth daily. 06/11/22   [provider]  fluticasone (FLONASE) 50 MCG/ACT nasal spray Place 1 spray into both nostrils daily. 03/05/23   Hunsucker, Lesia Sago, MD  Fluticasone-Umeclidin-Vilant (TRELEGY ELLIPTA) 200-62.5-25 MCG/ACT AEPB Inhale 1 puff into the lungs daily. 03/05/23   Hunsucker, Lesia Sago, MD  losartan (COZAAR) 50 MG tablet Take 50 mg by mouth daily.    [provider]  metoCLOPramide (REGLAN) 10 MG tablet Take 1 tablet (10 mg total) by mouth 4 (four) times daily -  before meals and at bedtime. 06/20/22   Mapp, Gaylyn Cheers, MD  metoprolol tartrate (LOPRESSOR) 25 MG tablet Take 1 tablet (25 mg total) by mouth 2 (two) times daily.  04/23/21   Worthy Rancher B, FNP  oxyCODONE-acetaminophen (PERCOCET/ROXICET) 5-325 MG tablet Take 1 tablet by mouth every 6 (six) hours as needed for severe pain. 11/23/22   Vanetta Mulders, MD  pantoprazole (PROTONIX) 40 MG tablet Take 1 tablet (40 mg total) by mouth daily. 08/18/22   Zehr, Princella Pellegrini, PA-C  rosuvastatin (CRESTOR) 40 MG tablet Take 40 mg by mouth daily. 06/11/22   [provider]  sucralfate (CARAFATE) 1 GM/10ML suspension Take 10 mLs (1 g total) by mouth 4 (four) times daily -  with meals and at bedtime. 06/20/22   Mapp, Gaylyn Cheers, MD  TRELEGY ELLIPTA 200-62.5-25 MCG/INH AEPB Inhale 1 puff into the lungs daily. 07/17/21   [provider]    Physical Exam: BP (!) 144/82   Pulse 71   Temp 97.8 F (36.6 C) (Oral)   Resp 14   Ht 5\' 1"  (1.549 m)   Wt 52.2 kg   SpO2 100%   BMI 21.73 kg/m   General: 77 y.o. year-old female well developed well nourished in no acute distress.   Alert and oriented x3. Cardiovascular: Regular rate and rhythm with no rubs or gallops.  No thyromegaly or JVD noted.  No lower extremity edema. 2/4 pulses in all 4 extremities. Respiratory: Clear to auscultation with no wheezes or rales. Good inspiratory effort. Abdomen: Soft nontender nondistended with normal bowel sounds x4 quadrants. Muskuloskeletal: No cyanosis, clubbing or edema noted bilaterally Neuro: CN II-XII intact, strength, sensation, reflexes Skin: No ulcerative lesions noted or rashes Psychiatry: Judgement and insight appear normal. Mood is appropriate for condition and setting          Labs on Admission:  Basic Metabolic Panel: Recent Labs  Lab 04/20/23 1450  NA 135  K 4.4  CL 99  CO2 23  GLUCOSE 153*  BUN 63*  CREATININE 1.93*  CALCIUM 10.0   Liver Function Tests: Recent Labs  Lab 04/20/23 1545  AST 27  ALT 21  ALKPHOS 81  BILITOT 0.8  PROT 8.4*  ALBUMIN 4.4   Recent Labs  Lab 04/20/23 1450  LIPASE 50   No results for input(s): "AMMONIA" in the last 168 hours. CBC: Recent Labs  Lab 04/20/23 1415  WBC 8.8  NEUTROABS 7.8*  HGB 15.1*  HCT 45.6  MCV 87.9  PLT 270   Cardiac Enzymes: No results for input(s): "CKTOTAL", "CKMB", "CKMBINDEX", "TROPONINI" in the last 168 hours.  BNP (last 3 results) Recent Labs    11/27/22 0824  BNP 301.8*    ProBNP (last 3 results) No results for input(s): "PROBNP" in the last 8760 hours.  CBG: Recent Labs  Lab 04/20/23 1428  GLUCAP 141*    Radiological Exams on Admission: CT ABDOMEN PELVIS WO CONTRAST  Result Date: 04/20/2023 CLINICAL DATA:  Abdominal pain. EXAM: CT ABDOMEN AND PELVIS WITHOUT CONTRAST TECHNIQUE: Multidetector CT imaging of the abdomen and pelvis was performed following the standard protocol without IV contrast. RADIATION DOSE REDUCTION: This exam was performed according to the departmental dose-optimization program which includes automated exposure control, adjustment of the mA  and/or kV according to patient size and/or use of iterative reconstruction technique. COMPARISON:  CT of the chest abdomen pelvis dated 11/27/2022. FINDINGS: Evaluation of this exam is limited in the absence of intravenous contrast. Lower chest: Pleural base calcification at the right lung base. The visualized lung bases are otherwise clear. There is coronary vascular calcification. Partially visualized indeterminate 2 cm structure at the right cardiophrenic angle, seen on the prior CT and demonstrates  fluid attenuation, likely a cyst. No intra-abdominal free air or free fluid. Hepatobiliary: Several small liver cyst and additional subcentimeter hypodense lesions which are too small to characterize. No biliary dilatation. Cholecystectomy. No retained calcified stone noted in the central CBD. Pancreas: Unremarkable. No pancreatic ductal dilatation or surrounding inflammatory changes. Spleen: Normal in size without focal abnormality. Adrenals/Urinary Tract: Status post prior right nephrectomy. No suspicious lesion noted in the nephrectomy bed. The right adrenal gland is not visualized. The left adrenal gland is grossly unremarkable. There is no hydronephrosis or nephrolithiasis on the left. The left ureter and urinary bladder appear unremarkable. Stomach/Bowel: There is no bowel obstruction or active inflammation. Scattered distal colonic diverticula without active inflammatory changes. The appendix is not visualized with certainty. No inflammatory changes identified in the right lower quadrant. Vascular/Lymphatic: Moderate aortoiliac atherosclerotic disease. There is a 4.3 cm infrarenal abdominal aortic aneurysm status post aorto bi iliac endovascular stent graft repair. The IVC is unremarkable. No portal venous gas. There is no adenopathy. Reproductive: The uterus is grossly unremarkable. No adnexal masses. Other: None Musculoskeletal: Osteopenia with degenerative changes of the spine. Old L3 compression fracture.  No acute osseous pathology. IMPRESSION: 1. No acute intra-abdominal or pelvic pathology. 2. Status post prior right nephrectomy. 3. Colonic diverticulosis. No bowel obstruction. 4.  Aortic Atherosclerosis (ICD10-I70.0). Electronically Signed   By: Elgie Collard M.D.   On: 04/20/2023 18:21   DG Chest 2 View  Result Date: 04/20/2023 CLINICAL DATA:  Chest pain EXAM: CHEST - 2 VIEW COMPARISON:  X-ray 03/05/2019 and older.  CT angiogram 11/27/2018 FINDINGS: Prosthetic valve. Curvature of the spine. Normal cardiopericardial silhouette. No pneumothorax, effusion or edema. There is some focal opacity along the middle lobe. Surgical changes in the upper abdomen. IMPRESSION: Focal opacity along the middle lobe. Possible infiltrate versus atelectasis. Recommend follow-up. Electronically Signed   By: Karen Kays M.D.   On: 04/20/2023 15:35    EKG: I independently viewed the EKG done and my findings are as followed: Normal sinus rhythm rate of 75.  Nonspecific ST-T changes.  QTc 658.  Assessment/Plan Present on Admission:  Nausea and vomiting  Principal Problem:   Nausea and vomiting  Intractable nausea and vomiting, unclear etiology Possibly from viral gastroenteritis. Improved with IV antiemetics Non contrast CT abdomen pelvis shows no pathology in the intra-abdominal cavity. Lipase level negative No transaminitis. Continue supportive care  Hypotension, likely iatrogenic Closely to monitor on telemetry Initially severely hypertensive.  Became hypotensive with MAP in the 50s after resuming her oral blood pressure medications Continue to closely monitor vital signs Maintain MAP greater than 65.  Elevated troponin, suspect demand ischemia in the setting of hypotension. High-sensitivity troponin flat 19, repeat 20 No reported anginal symptoms at the time of this visit. Monitor on telemetry  Prolonged Qtc QTc on admission 12 lead EKG 658 Avoid QTc prolonging agents Optimize magnesium level  greater than 2.0 Optimize potassium level greater than 4.0  AKI on CKD 3B Likely prerenal in the setting of dehydration from poor oral intake Avoid nephrotoxic agents and dehydration Gentle IV fluid hydration NS at 50 cc/h x 1 day Repeat BMP  COPD Resume home regimen    DVT prophylaxis: Subcu Lovenox daily  Code Status: Full code  Family Communication: None at bedside.  Disposition Plan: Admitted to telemetry medical unit  Consults called: None.  Admission status: Observation status   Status is: Observation    Darlin Drop MD [facility] Pager [phone]  If 7PM-7AM, please contact night-coverage [email or web address]  Password [email or web address]  04/21/2023, 2:41 AM

## 2023-04-21 NOTE — ED Notes (Signed)
Report called to floor patient ready to go up

## 2023-04-21 NOTE — Hospital Course (Signed)
Same day note  Patient seen and examined at bedside.  Patient was admitted to the hospital for nausea and vomiting  At the time of my evaluation, patient complains of  Physical examination reveals  Laboratory data and imaging was reviewed  Assessment and Plan.  Intractable nausea and vomiting possibly from viral gastroenteritis. CT scan of the abdomen negative lipase negative.  Continue supportive care IV fluids antiemetics.  Hypotension, likely iatrogenic Initially severely hypertensive.  Became hypotensive with MAP in the 50s after resuming her oral blood pressure medications Will continue to monitor.   Elevated troponin, suspect demand ischemia in the setting of hypotension. High-sensitivity troponin flat 19, repeat 20 No chest pain.  Continue telemetry   Prolonged Qtc QTc on admission 12 lead EKG 658..  Monitor electrolytes closely.    AKI on CKD 3b Likely secondary to nausea vomiting.  Baseline creatinine around 1.6.  Creatinine on presentation at 1.9.  Creatinine has improved to 1.4 today.   COPD Resume home regimen  No Charge  Signed,  Tenny Craw, MD [facility]

## 2023-04-21 NOTE — ED Notes (Signed)
ED TO INPATIENT HANDOFF REPORT  ED Nurse Name and Phone #: [phone] Jesenya Bowditch  S Name/Age/Gender Katie [name] 77 y.o. female Room/Bed: 026C/026C  Code Status   Code Status: Full Code  Home/SNF/Other Home Patient oriented to: self, place, time, and situation Is this baseline? Yes   Triage Complete: Triage complete  Chief Complaint Nausea and vomiting [R11.2] Hypotension [I95.9]  Triage Note Pt to ED c/o n/v that started today around 3 am. Pt also c/o generalized body pain , abdominal pain. Pt also c/o chest pain and SHOB, reports she has this all the time d/t COPD   Allergies No Known Allergies  Level of Care/Admitting Diagnosis ED Disposition     ED Disposition  Admit   Condition  --   Comment  Hospital Area: [facility] [[id number]]  Level of Care: Telemetry Medical [104]  May place patient in observation at [facility] or [facility] if equivalent level of care is available:: Yes  Covid Evaluation: Asymptomatic - no recent exposure (last 10 days) testing not required  Diagnosis: Hypotension [454098]  Admitting Physician: Darlin Drop [[id number]]  Attending Physician: Darlin Drop [[id number]]          B Medical/Surgery History Past Medical History:  Diagnosis Date   AAA (abdominal aortic aneurysm) (HCC)    Arthritis    Chest pain 04/17/2016   Cholelithiasis 2011   s/p cholescystectomy    Chronic diastolic congestive heart failure (HCC)    COPD (chronic obstructive pulmonary disease) (HCC)    GERD (gastroesophageal reflux disease)    History of renal cell carcinoma    Hypercholesteremia    Hypertension    Hypertension    Mitral regurgitation    Nausea and vomiting 06/19/2022   Pneumonia 1980's   Renal cell carcinoma 01/2010   s/p right radical nephrectomy 01/2010,  followed by [facility]   S/P minimally invasive mitral valve repair 04/20/2019   Complex valvuloplasty including triangular resection of flail segment of posterior  leaflet, artificial Gore-tex neochord placement x6 and 30 mm Sorin Memo 4D ring annuloplasty via right mini thoracotomy approach   Shortness of breath    Tuberculosis    Tests positive for PPD. dad had h/o TB.   Past Surgical History:  Procedure Laterality Date   ABDOMINAL AORTIC ENDOVASCULAR STENT GRAFT N/A 01/10/2022   Procedure: ABDOMINAL AORTIC ENDOVASCULAR STENT GRAFT;  Surgeon: Cephus Shelling, MD;  Location: [facility] OR;  Service: Vascular;  Laterality: N/A;   APPENDECTOMY  1970's   BUBBLE STUDY  03/18/2019   Procedure: BUBBLE STUDY;  Surgeon: Pricilla Riffle, MD;  Location: [facility] ENDOSCOPY;  Service: Cardiovascular;;   CARDIAC CATHETERIZATION  09/2011   CHOLECYSTECTOMY  01/2010   DIAGNOSTIC LAPAROSCOPY     KIDNEY SURGERY     LEFT AND RIGHT HEART CATHETERIZATION WITH CORONARY ANGIOGRAM N/A 09/30/2011   Procedure: LEFT AND RIGHT HEART CATHETERIZATION WITH CORONARY ANGIOGRAM;  Surgeon: Donato Schultz, MD;  Location: [facility] CATH LAB;  Service: Cardiovascular;  Laterality: N/A;   MITRAL VALVE REPAIR Right 04/20/2019   Procedure: MINIMALLY INVASIVE MITRAL VALVE REPAIR (MVR) USING MEMO 4D SIZE 30;  Surgeon: Purcell Nails, MD;  Location: [facility] OR;  Service: Open Heart Surgery;  Laterality: Right;   MULTIPLE EXTRACTIONS WITH ALVEOLOPLASTY  10/06/2011   Procedure: MULTIPLE EXTRACION WITH ALVEOLOPLASTY;  Surgeon: Charlynne Pander, DDS;  Location: [facility] OR;  Service: Oral Surgery;  Laterality: N/A;  Multiple extraction of tooth #'s 1, 6, 8, 10, 11, 22, 23, 26, 27,  28, 29 with alveoloplasty and Upper right buccal exostoses reductions.   NEPHRECTOMY RADICAL  01/2010   right    ORIF WRIST FRACTURE Right 02/17/2022   Procedure: OPEN REDUCTION INTERNAL FIXATION (ORIF) WRIST FRACTURE;  Surgeon: Bradly Bienenstock, MD;  Location: [facility] OR;  Service: Orthopedics;  Laterality: Right;  with IV sedation   OVARIAN CYST REMOVAL  1970's   "went through belly button"   RIGHT/LEFT HEART CATH AND CORONARY ANGIOGRAPHY N/A 03/22/2019    Procedure: RIGHT/LEFT HEART CATH AND CORONARY ANGIOGRAPHY;  Surgeon: Corky Crafts, MD;  Location: [facility] INVASIVE CV LAB;  Service: Cardiovascular;  Laterality: N/A;   TEE WITHOUT CARDIOVERSION  10/01/2011   Procedure: TRANSESOPHAGEAL ECHOCARDIOGRAM (TEE);  Surgeon: Corky Crafts;  Location: [facility] ENDOSCOPY;  Service: Cardiovascular;  Laterality: N/A;   TEE WITHOUT CARDIOVERSION N/A 03/18/2019   Procedure: TRANSESOPHAGEAL ECHOCARDIOGRAM (TEE);  Surgeon: Pricilla Riffle, MD;  Location: [facility] ENDOSCOPY;  Service: Cardiovascular;  Laterality: N/A;   TEE WITHOUT CARDIOVERSION N/A 04/20/2019   Procedure: TRANSESOPHAGEAL ECHOCARDIOGRAM (TEE);  Surgeon: Purcell Nails, MD;  Location: [facility] OR;  Service: Open Heart Surgery;  Laterality: N/A;   TUBAL LIGATION  1970's   US ECHOCARDIOGRAPHY  09/2011     A IV Location/Drains/Wounds Patient Lines/Drains/Airways Status     Active Line/Drains/Airways     Name Placement date Placement time Site Days   Peripheral IV 04/20/23 20 G Left Antecubital 04/20/23  1550  Antecubital  1            Intake/Output Last 24 hours No intake or output data in the 24 hours ending 04/21/23 0432  Labs/Imaging Results for orders placed or performed during the hospital encounter of 04/20/23 (from the past 48 hour(s))  CBC with Differential     Status: Abnormal   Collection Time: 04/20/23  2:15 PM  Result Value Ref Range   WBC 8.8 4.0 - 10.5 K/uL   RBC 5.19 (H) 3.87 - 5.11 MIL/uL   Hemoglobin 15.1 (H) 12.0 - 15.0 g/dL   HCT 16.1 09.6 - 04.5 %   MCV 87.9 80.0 - 100.0 fL   MCH 29.1 26.0 - 34.0 pg   MCHC 33.1 30.0 - 36.0 g/dL   RDW 40.9 81.1 - 91.4 %   Platelets 270 150 - 400 K/uL   nRBC 0.0 0.0 - 0.2 %   Neutrophils Relative % 89 %   Neutro Abs 7.8 (H) 1.7 - 7.7 K/uL   Lymphocytes Relative 9 %   Lymphs Abs 0.8 0.7 - 4.0 K/uL   Monocytes Relative 2 %   Monocytes Absolute 0.1 0.1 - 1.0 K/uL   Eosinophils Relative 0 %   Eosinophils Absolute 0.0 0.0 - 0.5 K/uL    Basophils Relative 0 %   Basophils Absolute 0.0 0.0 - 0.1 K/uL   Immature Granulocytes 0 %   Abs Immature Granulocytes 0.02 0.00 - 0.07 K/uL    Comment: Performed at [facility], [address], [place], [place] [address]  POC CBG, ED     Status: Abnormal   Collection Time: 04/20/23  2:28 PM  Result Value Ref Range   Glucose-Capillary 141 (H) 70 - 99 mg/dL    Comment: Glucose reference range applies only to samples taken after fasting for at least 8 hours.   Comment 1 Notify RN    Comment 2 Document in Chart   Basic metabolic panel     Status: Abnormal   Collection Time: 04/20/23  2:50 PM  Result Value Ref  Range   Sodium 135 135 - 145 mmol/L   Potassium 4.4 3.5 - 5.1 mmol/L   Chloride 99 98 - 111 mmol/L   CO2 23 22 - 32 mmol/L   Glucose, Bld 153 (H) 70 - 99 mg/dL    Comment: Glucose reference range applies only to samples taken after fasting for at least 8 hours.   BUN 63 (H) 8 - 23 mg/dL   Creatinine, Ser 1.61 (H) 0.44 - 1.00 mg/dL   Calcium 09.6 8.9 - 04.5 mg/dL   GFR, Estimated 27 (L) >60 mL/min    Comment: (NOTE) Calculated using the CKD-EPI Creatinine Equation (2021)    Anion gap 13 5 - 15    Comment: Performed at [facility], [address], [place], [place] [address]  Troponin I (High Sensitivity)     Status: Abnormal   Collection Time: 04/20/23  2:50 PM  Result Value Ref Range   Troponin I (High Sensitivity) 19 (H) <18 ng/L    Comment: (NOTE) Elevated high sensitivity troponin I (hsTnI) values and significant  changes across serial measurements may suggest ACS but many other  chronic and acute conditions are known to elevate hsTnI results.  Refer to the "Links" section for chest pain algorithms and additional  guidance. Performed at [facility], [address] [place], [place] [address]   Lipase, blood     Status: None   Collection Time: 04/20/23  2:50 PM  Result Value Ref Range   Lipase 50 11 - 51 U/L    Comment: Performed at [facility], [address] [place], [place] [address]  SARS Coronavirus 2 by RT PCR (hospital order, performed in [facility] hospital lab) *cepheid single result test* Anterior Nasal Swab     Status: None   Collection Time: 04/20/23  3:45 PM   Specimen: Anterior Nasal Swab  Result Value Ref Range   SARS Coronavirus 2 by RT PCR NEGATIVE NEGATIVE    Comment: Performed at [facility], [address] [place], [place] [address]  Troponin I (High Sensitivity)     Status: Abnormal   Collection Time: 04/20/23  3:45 PM  Result Value Ref Range   Troponin I (High Sensitivity) 20 (H) <18 ng/L    Comment: (NOTE) Elevated high sensitivity troponin I (hsTnI) values and significant  changes across serial measurements may suggest ACS but many other  chronic and acute conditions are known to elevate hsTnI results.  Refer to the "Links" section for chest pain algorithms and additional  guidance. Performed at [facility], [address] [place], [place] [address]   Hepatic function panel     Status: Abnormal   Collection Time: 04/20/23  3:45 PM  Result Value Ref Range   Total Protein 8.4 (H) 6.5 - 8.1 g/dL   Albumin 4.4 3.5 - 5.0 g/dL   AST 27 15 - 41 U/L   ALT 21 0 - 44 U/L   Alkaline Phosphatase 81 38 - 126 U/L   Total Bilirubin 0.8 0.3 - 1.2 mg/dL   Bilirubin, Direct 0.1 0.0 - 0.2 mg/dL   Indirect Bilirubin 0.7 0.3 - 0.9 mg/dL    Comment: Performed at [facility], [address], [place], [place] [address]  Urinalysis, Routine w reflex microscopic -Urine, Clean Catch     Status: Abnormal   Collection Time: 04/20/23  5:01 PM  Result Value Ref Range   Color, Urine YELLOW YELLOW   APPearance  CLEAR CLEAR   Specific Gravity, Urine 1.012 1.005 - 1.030   pH 6.0 5.0 - 8.0   Glucose, UA NEGATIVE NEGATIVE mg/dL   Hgb urine dipstick NEGATIVE NEGATIVE   Bilirubin Urine NEGATIVE NEGATIVE   Ketones, ur NEGATIVE NEGATIVE mg/dL   Protein, ur 30 (A) NEGATIVE mg/dL   Nitrite NEGATIVE  NEGATIVE   Leukocytes,Ua NEGATIVE NEGATIVE   RBC / HPF 0-5 0 - 5 RBC/hpf   WBC, UA 0-5 0 - 5 WBC/hpf   Bacteria, UA RARE (A) NONE SEEN   Squamous Epithelial / HPF 0-5 0 - 5 /HPF   Mucus PRESENT     Comment: Performed at [facility], [address] [place], [place] [address]   CT ABDOMEN PELVIS WO CONTRAST  Result Date: 04/20/2023 CLINICAL DATA:  Abdominal pain. EXAM: CT ABDOMEN AND PELVIS WITHOUT CONTRAST TECHNIQUE: Multidetector CT imaging of the abdomen and pelvis was performed following the standard protocol without IV contrast. RADIATION DOSE REDUCTION: This exam was performed according to the departmental dose-optimization program which includes automated exposure control, adjustment of the mA and/or kV according to patient size and/or use of iterative reconstruction technique. COMPARISON:  CT of the chest abdomen pelvis dated 11/27/2022. FINDINGS: Evaluation of this exam is limited in the absence of intravenous contrast. Lower chest: Pleural base calcification at the right lung base. The visualized lung bases are otherwise clear. There is coronary vascular calcification. Partially visualized indeterminate 2 cm structure at the right cardiophrenic angle, seen on the prior CT and demonstrates fluid attenuation, likely a cyst. No intra-abdominal free air or free fluid. Hepatobiliary: Several small liver cyst and additional subcentimeter hypodense lesions which are too small to characterize. No biliary dilatation. Cholecystectomy. No retained calcified stone noted in the central CBD. Pancreas: Unremarkable. No pancreatic ductal dilatation or surrounding inflammatory changes. Spleen: Normal in size without focal abnormality. Adrenals/Urinary Tract: Status post prior right nephrectomy. No suspicious lesion noted in the nephrectomy bed. The right adrenal gland is not visualized. The left adrenal gland is grossly unremarkable. There is no hydronephrosis or nephrolithiasis on the left. The left ureter  and urinary bladder appear unremarkable. Stomach/Bowel: There is no bowel obstruction or active inflammation. Scattered distal colonic diverticula without active inflammatory changes. The appendix is not visualized with certainty. No inflammatory changes identified in the right lower quadrant. Vascular/Lymphatic: Moderate aortoiliac atherosclerotic disease. There is a 4.3 cm infrarenal abdominal aortic aneurysm status post aorto bi iliac endovascular stent graft repair. The IVC is unremarkable. No portal venous gas. There is no adenopathy. Reproductive: The uterus is grossly unremarkable. No adnexal masses. Other: None Musculoskeletal: Osteopenia with degenerative changes of the spine. Old L3 compression fracture. No acute osseous pathology. IMPRESSION: 1. No acute intra-abdominal or pelvic pathology. 2. Status post prior right nephrectomy. 3. Colonic diverticulosis. No bowel obstruction. 4.  Aortic Atherosclerosis (ICD10-I70.0). Electronically Signed   By: Elgie Collard M.D.   On: 04/20/2023 18:21   DG Chest 2 View  Result Date: 04/20/2023 CLINICAL DATA:  Chest pain EXAM: CHEST - 2 VIEW COMPARISON:  X-ray 03/05/2019 and older.  CT angiogram 11/27/2018 FINDINGS: Prosthetic valve. Curvature of the spine. Normal cardiopericardial silhouette. No pneumothorax, effusion or edema. There is some focal opacity along the middle lobe. Surgical changes in the upper abdomen. IMPRESSION: Focal opacity along the middle lobe. Possible infiltrate versus atelectasis. Recommend follow-up. Electronically Signed   By: Karen Kays M.D.   On: 04/20/2023 15:35    Pending Labs Unresulted Labs (From admission, onward)  Start     Ordered   04/21/23 0430  Basic metabolic panel  Add-on,   AD        04/21/23 0429   04/21/23 0430  Magnesium  Add-on,   AD        04/21/23 0429   04/21/23 0404  CBC  (enoxaparin (LOVENOX)    CrCl < 30 ml/min)  Once,   R       Comments: Baseline for enoxaparin therapy IF NOT ALREADY DRAWN.   Notify MD if PLT < 100 K.    04/21/23 0403   04/21/23 0404  Creatinine, serum  (enoxaparin (LOVENOX)    CrCl < 30 ml/min)  Once,   R       Comments: Baseline for enoxaparin therapy IF NOT ALREADY DRAWN.    04/21/23 0403            Vitals/Pain Today's Vitals   04/21/23 0330 04/21/23 0345 04/21/23 0400 04/21/23 0415  BP: 125/65 132/78 133/68 113/68  Pulse: 64 63 62 61  Resp: (!) 23 17 18  (!) 26  Temp:      TempSrc:      SpO2: 100% 100% 100% 98%  Weight:      Height:      PainSc:        Isolation Precautions No active isolations  Medications Medications  oxyCODONE (Oxy IR/ROXICODONE) immediate release tablet 5 mg (has no administration in time range)  0.9 %  sodium chloride infusion ( Intravenous New Bag/Given 04/21/23 0319)  acetaminophen (TYLENOL) tablet 650 mg (650 mg Oral Given 04/21/23 0415)  prochlorperazine (COMPAZINE) injection 5 mg (has no administration in time range)  melatonin tablet 5 mg (has no administration in time range)  polyethylene glycol (MIRALAX / GLYCOLAX) packet 17 g (has no administration in time range)  enoxaparin (LOVENOX) injection 30 mg (30 mg Subcutaneous Not Given 04/21/23 0405)  albuterol (VENTOLIN HFA) 108 (90 Base) MCG/ACT inhaler 2 puff (has no administration in time range)  ezetimibe (ZETIA) tablet 10 mg (has no administration in time range)  fluticasone furoate-vilanterol (BREO ELLIPTA) 200-25 MCG/ACT 1 puff (has no administration in time range)    And  umeclidinium bromide (INCRUSE ELLIPTA) 62.5 MCG/ACT 1 puff (has no administration in time range)  pantoprazole (PROTONIX) EC tablet 40 mg (has no administration in time range)  rosuvastatin (CRESTOR) tablet 40 mg (has no administration in time range)  ondansetron (ZOFRAN) injection 4 mg (4 mg Intravenous Given 04/20/23 1553)  pantoprazole (PROTONIX) injection 40 mg (40 mg Intravenous Given 04/20/23 1553)  lactated ringers bolus 1,000 mL (0 mLs Intravenous Stopped 04/20/23 1655)  cloNIDine  (CATAPRES) tablet 0.2 mg (0.2 mg Oral Given 04/20/23 1700)  acetaminophen (TYLENOL) tablet 1,000 mg (1,000 mg Oral Given 04/20/23 1834)  metoprolol tartrate (LOPRESSOR) tablet 25 mg (25 mg Oral Given 04/20/23 1838)  losartan (COZAAR) tablet 50 mg (50 mg Oral Given 04/20/23 1838)  sodium chloride 0.9 % bolus 1,000 mL (0 mLs Intravenous Stopped 04/20/23 2018)  glucagon (human recombinant) (GLUCAGEN) injection 5 mg (5 mg Intravenous Given 04/20/23 1958)  0.9 %  sodium chloride infusion (0 mLs Intravenous Stopped 04/20/23 2128)  0.9 %  sodium chloride infusion (0 mLs Intravenous Stopped 04/20/23 2237)    Mobility walks     Focused Assessments Cardiac Assessment Handoff:    Lab Results  Component Value Date   TROPONINI <0.03 03/13/2019   No results found for: "DDIMER" Does the Patient currently have chest pain? No   ,    R  Recommendations: See Admitting Provider Note  Report given to:   Additional Notes:  Pt a/o x 4  ambulates with steady gait 20g LAC vs wnl just gave tylenol for headache hx COPD has had no sob or emesis since I received her

## 2023-04-22 DIAGNOSIS — F502 Bulimia nervosa: Secondary | ICD-10-CM

## 2023-04-22 LAB — CBC
HCT: 36.1 % (ref 36.0–46.0)
Hemoglobin: 11.6 g/dL — ABNORMAL LOW (ref 12.0–15.0)
MCH: 28.4 pg (ref 26.0–34.0)
MCHC: 32.1 g/dL (ref 30.0–36.0)
MCV: 88.5 fL (ref 80.0–100.0)
Platelets: 197 10*3/uL (ref 150–400)
RBC: 4.08 MIL/uL (ref 3.87–5.11)
RDW: 13 % (ref 11.5–15.5)
WBC: 5.9 10*3/uL (ref 4.0–10.5)
nRBC: 0 % (ref 0.0–0.2)

## 2023-04-22 LAB — BASIC METABOLIC PANEL
Anion gap: 6 (ref 5–15)
BUN: 26 mg/dL — ABNORMAL HIGH (ref 8–23)
CO2: 24 mmol/L (ref 22–32)
Calcium: 8.7 mg/dL — ABNORMAL LOW (ref 8.9–10.3)
Chloride: 108 mmol/L (ref 98–111)
Creatinine, Ser: 1.4 mg/dL — ABNORMAL HIGH (ref 0.44–1.00)
GFR, Estimated: 39 mL/min — ABNORMAL LOW (ref >60)
Glucose, Bld: 85 mg/dL (ref 70–99)
Potassium: 3.8 mmol/L (ref 3.5–5.1)
Sodium: 138 mmol/L (ref 135–145)

## 2023-04-22 LAB — MAGNESIUM: Magnesium: 1.9 mg/dL (ref 1.7–2.4)

## 2023-04-22 LAB — VITAMIN B12: Vitamin B-12: 257 pg/mL (ref 180–914)

## 2023-04-22 MED ORDER — HYDROCORTISONE 1 % EX CREA
TOPICAL_CREAM | Freq: Three times a day (TID) | CUTANEOUS | Status: DC
  Filled 2023-04-22: qty 28

## 2023-04-22 MED ORDER — METOPROLOL TARTRATE 5 MG/5ML IV SOLN: 5.0000 mg | INTRAVENOUS | Status: DC | PRN

## 2023-04-22 MED ORDER — IPRATROPIUM-ALBUTEROL 0.5-2.5 (3) MG/3ML IN SOLN: 3.0000 mL | RESPIRATORY_TRACT | Status: DC | PRN

## 2023-04-22 MED ORDER — TRAZODONE HCL 50 MG PO TABS: 50.0000 mg | ORAL_TABLET | Freq: Every evening | ORAL | Status: DC | PRN

## 2023-04-22 MED ORDER — GUAIFENESIN 100 MG/5ML PO LIQD: 5.0000 mL | ORAL | Status: DC | PRN

## 2023-04-22 MED ORDER — SENNOSIDES-DOCUSATE SODIUM 8.6-50 MG PO TABS: 1.0000 | ORAL_TABLET | Freq: Every evening | ORAL | Status: DC | PRN

## 2023-04-22 MED ORDER — SODIUM CHLORIDE 0.9 % IV SOLN: INTRAVENOUS | Status: DC

## 2023-04-22 MED ORDER — HYDRALAZINE HCL 20 MG/ML IJ SOLN: 10.0000 mg | INTRAMUSCULAR | Status: DC | PRN

## 2023-04-22 NOTE — Discharge Summary (Signed)
Physician Discharge Summary  Katie [name] ZOX:[id number] DOB: September 03, 1946 DOA: 04/20/2023  PCP: Ellyn Hack, MD  Admit date: 04/20/2023 Discharge date: 04/22/2023  Admitted From: Home Disposition: Home  Recommendations for Outpatient Follow-up:  Follow up with PCP in 1-2 weeks Please obtain BMP/CBC in one week your next doctors visit.    Home Health: None Equipment/Devices: None Discharge Condition: Stable CODE STATUS: Full code Diet recommendation: Heart team  Brief/Interim Summary: 77 year old with history of HTN, MR, HLD, renal cell carcinoma, CHF with preserved EF admitted for nausea and vomiting.  CT abdomen pelvis did not show any acute abnormality.  Chest x-ray showed right middle lobe focal opacity.  Initially severely hypotensive responded to fluids and acute kidney injury improved with IV fluids as well. Today patient is doing significantly well, tolerating orals without any issues.  She wishes to go home.   Assessment & Plan:  Principal Problem:   Nausea and vomiting Active Problems:   AKI (acute kidney injury) (HCC)   Hypotension     Intractable nausea and vomiting possibly from viral gastroenteritis.  Resolved Recurrent issue for him.  Follows [facility] CT scan of the abdomen negative, lipase was negative.  Continue supportive care, antiemetics.  Advance diet as tolerated.  Not a regular without any issues.   EGD June 2023-esophagitis, gastritis Gastric emptying study using November 2023-normal   Hypotension, likely iatrogenic Resolved, tolerating orals   Elevated troponin, suspect demand ischemia in the setting of hypotension. High-sensitivity troponin flat 19, repeat 20.  Remains flat No chest pain.  Continue telemetry   Prolonged Qtc QTc on admission 12 lead EKG 658.Marland Kitchen  Repeat EKG shows normal QTc    AKI on CKD 3b Likely secondary to nausea vomiting.  Baseline creatinine around 1.6.  Creatinine on presentation at 1.9.  Creatinine improving.  Discharge  creatinine 1.4   COPD Resume home regimen   Pruritus.   Generalized.  Possibly appears to be chigger bites..  Added hydrocortisone cream       Discharge Diagnoses:  Principal Problem:   Nausea and vomiting Active Problems:   AKI (acute kidney injury) (HCC)   Hypotension      Consultations: None  Subjective: Feels well, wishes to go home.  Discharge Exam: Vitals:   04/22/23 0906 04/22/23 0940  BP:  (!) 148/76  Pulse:    Resp:    Temp:    SpO2: 100%    Vitals:   04/22/23 0545 04/22/23 0743 04/22/23 0906 04/22/23 0940  BP: (!) 141/100 (!) 164/91  (!) 148/76  Pulse:  65    Resp:      Temp:  98.3 F (36.8 C)    TempSrc:      SpO2:  100% 100%   Weight:      Height:        General: Pt is alert, awake, not in acute distress Cardiovascular: RRR, S1/S2 +, no rubs, no gallops Respiratory: CTA bilaterally, no wheezing, no rhonchi Abdominal: Soft, NT, ND, bowel sounds + Extremities: no edema, no cyanosis  Discharge Instructions   Allergies as of 04/22/2023   No Known Allergies      Medication List     STOP taking these medications    oxyCODONE-acetaminophen 5-325 MG tablet Commonly known as: PERCOCET/ROXICET       TAKE these medications    albuterol 108 (90 Base) MCG/ACT inhaler Commonly known as: VENTOLIN HFA Inhale 2 puffs into the lungs every 6 (six) hours as needed for wheezing or shortness of breath.  cetirizine 10 MG tablet Commonly known as: ZyrTEC Allergy Take 1 tablet (10 mg total) by mouth at bedtime.   ezetimibe 10 MG tablet Commonly known as: ZETIA Take 10 mg by mouth daily.   fluticasone 50 MCG/ACT nasal spray Commonly known as: FLONASE Place 1 spray into both nostrils daily.   losartan 50 MG tablet Commonly known as: COZAAR Take 50 mg by mouth daily.   metoCLOPramide 10 MG tablet Commonly known as: REGLAN Take 1 tablet (10 mg total) by mouth 4 (four) times daily -  before meals and at bedtime.   metoprolol tartrate  50 MG tablet Commonly known as: LOPRESSOR Take 50 mg by mouth 2 (two) times daily. What changed: Another medication with the same name was removed. Continue taking this medication, and follow the directions you see here.   pantoprazole 40 MG tablet Commonly known as: PROTONIX Take 1 tablet (40 mg total) by mouth daily.   promethazine 25 MG tablet Commonly known as: PHENERGAN Take 25 mg by mouth every 6 (six) hours as needed for nausea or vomiting.   rosuvastatin 40 MG tablet Commonly known as: CRESTOR Take 40 mg by mouth daily.   sucralfate 1 GM/10ML suspension Commonly known as: CARAFATE Take 10 mLs (1 g total) by mouth 4 (four) times daily -  with meals and at bedtime.   Trelegy Ellipta 200-62.5-25 MCG/ACT Aepb Generic drug: Fluticasone-Umeclidin-Vilant Inhale 1 puff into the lungs daily.        Follow-up Information     [facility]. Schedule an appointment as soon as possible for a visit in 1 week.   Why: for follow up with GI               No Known Allergies  You were cared for by a hospitalist during your hospital stay. If you have any questions about your discharge medications or the care you received while you were in the hospital after you are discharged, you can call the unit and asked to speak with the hospitalist on call if the hospitalist that took care of you is not available. Once you are discharged, your primary care physician will handle any further medical issues. Please note that no refills for any discharge medications will be authorized once you are discharged, as it is imperative that you return to your primary care physician (or establish a relationship with a primary care physician if you do not have one) for your aftercare needs so that they can reassess your need for medications and monitor your lab values.  You were cared for by a hospitalist during your hospital stay. If you have any questions about your discharge medications or the  care you received while you were in the hospital after you are discharged, you can call the unit and asked to speak with the hospitalist on call if the hospitalist that took care of you is not available. Once you are discharged, your primary care physician will handle any further medical issues. Please note that NO REFILLS for any discharge medications will be authorized once you are discharged, as it is imperative that you return to your primary care physician (or establish a relationship with a primary care physician if you do not have one) for your aftercare needs so that they can reassess your need for medications and monitor your lab values.  Please request your Prim.MD to go over all Hospital Tests and Procedure/Radiological results at the follow up, please get all Hospital records sent to your Prim MD  by signing hospital release before you go home.  Get CBC, CMP, 2 view Chest X ray checked  by Primary MD during your next visit or SNF MD in 5-7 days ( we routinely change or add medications that can affect your baseline labs and fluid status, therefore we recommend that you get the mentioned basic workup next visit with your PCP, your PCP may decide not to get them or add new tests based on their clinical decision)  On your next visit with your primary care physician please Get Medicines reviewed and adjusted.  If you experience worsening of your admission symptoms, develop shortness of breath, life threatening emergency, suicidal or homicidal thoughts you must seek medical attention immediately by calling 911 or calling your MD immediately  if symptoms less severe.  You Must read complete instructions/literature along with all the possible adverse reactions/side effects for all the Medicines you take and that have been prescribed to you. Take any new Medicines after you have completely understood and accpet all the possible adverse reactions/side effects.   Do not drive, operate heavy machinery,  perform activities at heights, swimming or participation in water activities or provide baby sitting services if your were admitted for syncope or siezures until you have seen by Primary MD or a Neurologist and advised to do so again.  Do not drive when taking Pain medications.   Procedures/Studies: CT ABDOMEN PELVIS WO CONTRAST  Result Date: 04/20/2023 CLINICAL DATA:  Abdominal pain. EXAM: CT ABDOMEN AND PELVIS WITHOUT CONTRAST TECHNIQUE: Multidetector CT imaging of the abdomen and pelvis was performed following the standard protocol without IV contrast. RADIATION DOSE REDUCTION: This exam was performed according to the departmental dose-optimization program which includes automated exposure control, adjustment of the mA and/or kV according to patient size and/or use of iterative reconstruction technique. COMPARISON:  CT of the chest abdomen pelvis dated 11/27/2022. FINDINGS: Evaluation of this exam is limited in the absence of intravenous contrast. Lower chest: Pleural base calcification at the right lung base. The visualized lung bases are otherwise clear. There is coronary vascular calcification. Partially visualized indeterminate 2 cm structure at the right cardiophrenic angle, seen on the prior CT and demonstrates fluid attenuation, likely a cyst. No intra-abdominal free air or free fluid. Hepatobiliary: Several small liver cyst and additional subcentimeter hypodense lesions which are too small to characterize. No biliary dilatation. Cholecystectomy. No retained calcified stone noted in the central CBD. Pancreas: Unremarkable. No pancreatic ductal dilatation or surrounding inflammatory changes. Spleen: Normal in size without focal abnormality. Adrenals/Urinary Tract: Status post prior right nephrectomy. No suspicious lesion noted in the nephrectomy bed. The right adrenal gland is not visualized. The left adrenal gland is grossly unremarkable. There is no hydronephrosis or nephrolithiasis on the left.  The left ureter and urinary bladder appear unremarkable. Stomach/Bowel: There is no bowel obstruction or active inflammation. Scattered distal colonic diverticula without active inflammatory changes. The appendix is not visualized with certainty. No inflammatory changes identified in the right lower quadrant. Vascular/Lymphatic: Moderate aortoiliac atherosclerotic disease. There is a 4.3 cm infrarenal abdominal aortic aneurysm status post aorto bi iliac endovascular stent graft repair. The IVC is unremarkable. No portal venous gas. There is no adenopathy. Reproductive: The uterus is grossly unremarkable. No adnexal masses. Other: None Musculoskeletal: Osteopenia with degenerative changes of the spine. Old L3 compression fracture. No acute osseous pathology. IMPRESSION: 1. No acute intra-abdominal or pelvic pathology. 2. Status post prior right nephrectomy. 3. Colonic diverticulosis. No bowel obstruction. 4.  Aortic Atherosclerosis (ICD10-I70.0). Electronically  Signed   By: Elgie Collard M.D.   On: 04/20/2023 18:21   DG Chest 2 View  Result Date: 04/20/2023 CLINICAL DATA:  Chest pain EXAM: CHEST - 2 VIEW COMPARISON:  X-ray 03/05/2019 and older.  CT angiogram 11/27/2018 FINDINGS: Prosthetic valve. Curvature of the spine. Normal cardiopericardial silhouette. No pneumothorax, effusion or edema. There is some focal opacity along the middle lobe. Surgical changes in the upper abdomen. IMPRESSION: Focal opacity along the middle lobe. Possible infiltrate versus atelectasis. Recommend follow-up. Electronically Signed   By: Karen Kays M.D.   On: 04/20/2023 15:35     The results of significant diagnostics from this hospitalization (including imaging, microbiology, ancillary and laboratory) are listed below for reference.     Microbiology: Recent Results (from the past 240 hour(s))  SARS Coronavirus 2 by RT PCR (hospital order, performed in [facility] hospital lab) *cepheid single result test* Anterior Nasal  Swab     Status: None   Collection Time: 04/20/23  3:45 PM   Specimen: Anterior Nasal Swab  Result Value Ref Range Status   SARS Coronavirus 2 by RT PCR NEGATIVE NEGATIVE Final    Comment: Performed at [facility], [address] [place], [place] [address]     Labs: BNP (last 3 results) Recent Labs    11/27/22 0824  BNP 301.8*   Basic Metabolic Panel: Recent Labs  Lab 04/20/23 1450 04/21/23 0410 04/22/23 0438  NA 135 138 138  K 4.4 4.1 3.8  CL 99 108 108  CO2 23 23 24   GLUCOSE 153* 88 85  BUN 63* 37* 26*  CREATININE 1.93* 1.44*  1.42* 1.40*  CALCIUM 10.0 8.4* 8.7*  MG  --  1.9 1.9   Liver Function Tests: Recent Labs  Lab 04/20/23 1545  AST 27  ALT 21  ALKPHOS 81  BILITOT 0.8  PROT 8.4*  ALBUMIN 4.4   Recent Labs  Lab 04/20/23 1450  LIPASE 50   No results for input(s): "AMMONIA" in the last 168 hours. CBC: Recent Labs  Lab 04/20/23 1415 04/21/23 0410 04/22/23 0438  WBC 8.8 6.5 5.9  NEUTROABS 7.8*  --   --   HGB 15.1* 11.1* 11.6*  HCT 45.6 34.3* 36.1  MCV 87.9 88.9 88.5  PLT 270 178 197   Cardiac Enzymes: No results for input(s): "CKTOTAL", "CKMB", "CKMBINDEX", "TROPONINI" in the last 168 hours. BNP: Invalid input(s): "POCBNP" CBG: Recent Labs  Lab 04/20/23 1428  GLUCAP 141*   D-Dimer No results for input(s): "DDIMER" in the last 72 hours. Hgb A1c No results for input(s): "HGBA1C" in the last 72 hours. Lipid Profile No results for input(s): "CHOL", "HDL", "LDLCALC", "TRIG", "CHOLHDL", "LDLDIRECT" in the last 72 hours. Thyroid function studies No results for input(s): "TSH", "T4TOTAL", "T3FREE", "THYROIDAB" in the last 72 hours.  Invalid input(s): "FREET3" Anemia work up Recent Labs    04/22/23 0848  VITAMINB12 257   Urinalysis    Component Value Date/Time   COLORURINE YELLOW 04/20/2023 1701   APPEARANCEUR CLEAR 04/20/2023 1701   LABSPEC 1.012 04/20/2023 1701   PHURINE 6.0 04/20/2023 1701   GLUCOSEU NEGATIVE  04/20/2023 1701   GLUCOSEU NEGATIVE 04/29/2017 1122   HGBUR NEGATIVE 04/20/2023 1701   BILIRUBINUR NEGATIVE 04/20/2023 1701   BILIRUBINUR negative 06/11/2018 0911   KETONESUR NEGATIVE 04/20/2023 1701   PROTEINUR 30 (A) 04/20/2023 1701   UROBILINOGEN 0.2 02/27/2022 1912   NITRITE NEGATIVE 04/20/2023 1701   LEUKOCYTESUR NEGATIVE 04/20/2023 1701   Sepsis Labs Recent Labs  Lab 04/20/23 1415 04/21/23 0410 04/22/23 0438  WBC 8.8 6.5 5.9   Microbiology Recent Results (from the past 240 hour(s))  SARS Coronavirus 2 by RT PCR (hospital order, performed in [facility] hospital lab) *cepheid single result test* Anterior Nasal Swab     Status: None   Collection Time: 04/20/23  3:45 PM   Specimen: Anterior Nasal Swab  Result Value Ref Range Status   SARS Coronavirus 2 by RT PCR NEGATIVE NEGATIVE Final    Comment: Performed at [facility], [address] [place], [place] [address]     Time coordinating discharge:  I have spent 35 minutes face to face with the patient and on the ward discussing the patients care, assessment, plan and disposition with other care givers. >50% of the time was devoted counseling the patient about the risks and benefits of treatment/Discharge disposition and coordinating care.   SIGNED:   Dimple Nanas, MD  [facility] 04/22/2023, 11:41 AM   If 7PM-7AM, please contact night-coverage

## 2023-04-22 NOTE — TOC Transition Note (Addendum)
Transition of Care [facility]) - CM/SW Discharge Note   Patient Details  Name: Katie [name] MRN: [id number] Date of Birth: December 05, 1945  [facility] CM/SW Contact:  Janae Bridgeman, RN Phone Number: 04/22/2023, 11:57 AM   Clinical Narrative:    CM met with the patient at the bedside prior to discharge to home with family.  The patient states that she needs assistance with transportation.  I called the patient's insurance provider and patient is no longer covered for transportation benefits.  Patient was provided with Senior transportation resources.  The patient has available neighbors that can assist with transportation as needed. Patient's neighbor will assist with transportation to home if patient is discharged before 2 pm today - RN is aware to expedite discharge to home.  Resources provided in the AVS.  Bedside nursing will discharge the patient home this morning.   Final next level of care: Home/Self Care Barriers to Discharge: Continued Medical Work up   Patient Goals and CMS Choice CMS Medicare.gov Compare Post Acute Care list provided to:: Patient Choice offered to / list presented to : Patient  Discharge Placement                         Discharge Plan and Services Additional resources added to the After Visit Summary for     Discharge Planning Services: CM Consult Post Acute Care Choice: Resumption of Svcs/PTA Provider                               Social Determinants of Health (SDOH) Interventions SDOH Screenings   Food Insecurity: No Food Insecurity (04/21/2023)  Housing: Low Risk  (04/21/2023)  Transportation Needs: Unmet Transportation Needs (04/21/2023)  Utilities: Not At Risk (04/21/2023)  Depression (PHQ2-9): Low Risk  (11/22/2020)  Tobacco Use: High Risk (04/21/2023)     Readmission Risk Interventions    04/22/2023   11:57 AM  Readmission Risk Prevention Plan  Transportation Screening Complete  PCP or Specialist Appt within 3-5  Days Complete  HRI or Home Care Consult Complete  Social Work Consult for Recovery Care Planning/Counseling Complete  Palliative Care Screening Complete  Medication Review Oceanographer) Complete

## 2023-10-07 ENCOUNTER — Other Ambulatory Visit: Payer: Self-pay | Admitting: Acute Care

## 2023-10-07 DIAGNOSIS — Z87891 Personal history of nicotine dependence: Secondary | ICD-10-CM

## 2023-10-07 DIAGNOSIS — Z122 Encounter for screening for malignant neoplasm of respiratory organs: Secondary | ICD-10-CM

## 2023-10-07 DIAGNOSIS — F17210 Nicotine dependence, cigarettes, uncomplicated: Secondary | ICD-10-CM

## 2023-11-23 ENCOUNTER — Ambulatory Visit (HOSPITAL_COMMUNITY)
Admission: RE | Admit: 2023-11-23 | Discharge: 2023-11-23 | Disposition: A | Discharge status: Home / Self Care | Payer: Medicare Other | Source: Ambulatory Visit | Attending: Family Medicine | Admitting: Family Medicine

## 2023-11-23 DIAGNOSIS — Z87891 Personal history of nicotine dependence: Secondary | ICD-10-CM | POA: Insufficient documentation

## 2023-11-23 DIAGNOSIS — Z122 Encounter for screening for malignant neoplasm of respiratory organs: Secondary | ICD-10-CM | POA: Diagnosis present

## 2023-11-23 DIAGNOSIS — F17210 Nicotine dependence, cigarettes, uncomplicated: Secondary | ICD-10-CM | POA: Insufficient documentation

## 2023-12-07 ENCOUNTER — Other Ambulatory Visit: Payer: Self-pay | Admitting: Acute Care

## 2023-12-07 DIAGNOSIS — Z122 Encounter for screening for malignant neoplasm of respiratory organs: Secondary | ICD-10-CM

## 2023-12-07 DIAGNOSIS — Z87891 Personal history of nicotine dependence: Secondary | ICD-10-CM

## 2024-02-02 ENCOUNTER — Encounter: Payer: Self-pay | Admitting: Cardiovascular Disease

## 2024-03-10 ENCOUNTER — Other Ambulatory Visit: Payer: Self-pay

## 2024-03-10 ENCOUNTER — Emergency Department (HOSPITAL_COMMUNITY): Payer: Medicare (Managed Care)

## 2024-03-10 ENCOUNTER — Observation Stay (HOSPITAL_COMMUNITY)
Admission: EM | Admit: 2024-03-10 | Discharge: 2024-03-12 | Disposition: A | Discharge status: Home / Self Care | Payer: Medicare (Managed Care) | Attending: Family Medicine | Admitting: Family Medicine

## 2024-03-10 ENCOUNTER — Encounter (HOSPITAL_COMMUNITY): Payer: Self-pay | Admitting: Internal Medicine

## 2024-03-10 DIAGNOSIS — I4581 Long QT syndrome: Secondary | ICD-10-CM | POA: Insufficient documentation

## 2024-03-10 DIAGNOSIS — Z79899 Other long term (current) drug therapy: Secondary | ICD-10-CM | POA: Insufficient documentation

## 2024-03-10 DIAGNOSIS — J449 Chronic obstructive pulmonary disease, unspecified: Secondary | ICD-10-CM | POA: Diagnosis not present

## 2024-03-10 DIAGNOSIS — I340 Nonrheumatic mitral (valve) insufficiency: Secondary | ICD-10-CM | POA: Diagnosis present

## 2024-03-10 DIAGNOSIS — R748 Abnormal levels of other serum enzymes: Secondary | ICD-10-CM | POA: Diagnosis not present

## 2024-03-10 DIAGNOSIS — Z87891 Personal history of nicotine dependence: Secondary | ICD-10-CM | POA: Insufficient documentation

## 2024-03-10 DIAGNOSIS — N179 Acute kidney failure, unspecified: Principal | ICD-10-CM | POA: Diagnosis present

## 2024-03-10 DIAGNOSIS — R112 Nausea with vomiting, unspecified: Secondary | ICD-10-CM

## 2024-03-10 DIAGNOSIS — A0472 Enterocolitis due to Clostridium difficile, not specified as recurrent: Secondary | ICD-10-CM | POA: Diagnosis not present

## 2024-03-10 DIAGNOSIS — Z952 Presence of prosthetic heart valve: Secondary | ICD-10-CM | POA: Insufficient documentation

## 2024-03-10 DIAGNOSIS — F418 Other specified anxiety disorders: Secondary | ICD-10-CM | POA: Diagnosis present

## 2024-03-10 DIAGNOSIS — E785 Hyperlipidemia, unspecified: Secondary | ICD-10-CM | POA: Diagnosis present

## 2024-03-10 DIAGNOSIS — I130 Hypertensive heart and chronic kidney disease with heart failure and stage 1 through stage 4 chronic kidney disease, or unspecified chronic kidney disease: Secondary | ICD-10-CM | POA: Diagnosis not present

## 2024-03-10 DIAGNOSIS — I5033 Acute on chronic diastolic (congestive) heart failure: Secondary | ICD-10-CM | POA: Diagnosis present

## 2024-03-10 DIAGNOSIS — I5032 Chronic diastolic (congestive) heart failure: Secondary | ICD-10-CM | POA: Diagnosis not present

## 2024-03-10 DIAGNOSIS — R9431 Abnormal electrocardiogram [ECG] [EKG]: Secondary | ICD-10-CM | POA: Diagnosis present

## 2024-03-10 DIAGNOSIS — I7143 Infrarenal abdominal aortic aneurysm, without rupture: Secondary | ICD-10-CM | POA: Diagnosis not present

## 2024-03-10 DIAGNOSIS — I4891 Unspecified atrial fibrillation: Secondary | ICD-10-CM | POA: Diagnosis present

## 2024-03-10 DIAGNOSIS — D750 Familial erythrocytosis: Secondary | ICD-10-CM | POA: Insufficient documentation

## 2024-03-10 DIAGNOSIS — I10 Essential (primary) hypertension: Secondary | ICD-10-CM | POA: Diagnosis present

## 2024-03-10 DIAGNOSIS — D72829 Elevated white blood cell count, unspecified: Secondary | ICD-10-CM | POA: Diagnosis not present

## 2024-03-10 DIAGNOSIS — I714 Abdominal aortic aneurysm, without rupture, unspecified: Secondary | ICD-10-CM | POA: Diagnosis not present

## 2024-03-10 DIAGNOSIS — R109 Unspecified abdominal pain: Secondary | ICD-10-CM | POA: Diagnosis present

## 2024-03-10 DIAGNOSIS — N1831 Chronic kidney disease, stage 3a: Secondary | ICD-10-CM | POA: Diagnosis not present

## 2024-03-10 DIAGNOSIS — Z9889 Other specified postprocedural states: Secondary | ICD-10-CM

## 2024-03-10 HISTORY — DX: Acute kidney failure, unspecified: N17.9

## 2024-03-10 LAB — COMPREHENSIVE METABOLIC PANEL WITH GFR
ALT: 34 U/L (ref 0–44)
AST: 44 U/L — ABNORMAL HIGH (ref 15–41)
Albumin: 4.4 g/dL (ref 3.5–5.0)
Alkaline Phosphatase: 92 U/L (ref 38–126)
Anion gap: 17 — ABNORMAL HIGH (ref 5–15)
BUN: 34 mg/dL — ABNORMAL HIGH (ref 8–23)
CO2: 23 mmol/L (ref 22–32)
Calcium: 10.6 mg/dL — ABNORMAL HIGH (ref 8.9–10.3)
Chloride: 95 mmol/L — ABNORMAL LOW (ref 98–111)
Creatinine, Ser: 2.04 mg/dL — ABNORMAL HIGH (ref 0.44–1.00)
GFR, Estimated: 25 mL/min — ABNORMAL LOW (ref >60)
Glucose, Bld: 134 mg/dL — ABNORMAL HIGH (ref 70–99)
Potassium: 4.2 mmol/L (ref 3.5–5.1)
Sodium: 135 mmol/L (ref 135–145)
Total Bilirubin: 1 mg/dL (ref 0.0–1.2)
Total Protein: 8.9 g/dL — ABNORMAL HIGH (ref 6.5–8.1)

## 2024-03-10 LAB — LACTIC ACID, PLASMA
Lactic Acid, Venous: 1.3 mmol/L (ref 0.5–1.9)
Lactic Acid, Venous: 2.7 mmol/L (ref 0.5–1.9)

## 2024-03-10 LAB — RAPID URINE DRUG SCREEN, HOSP PERFORMED
Amphetamines: NOT DETECTED
Barbiturates: NOT DETECTED
Benzodiazepines: NOT DETECTED
Cocaine: POSITIVE — AB
Opiates: NOT DETECTED
Tetrahydrocannabinol: NOT DETECTED

## 2024-03-10 LAB — URINALYSIS, ROUTINE W REFLEX MICROSCOPIC
Bacteria, UA: NONE SEEN
Bilirubin Urine: NEGATIVE
Glucose, UA: NEGATIVE mg/dL
Ketones, ur: NEGATIVE mg/dL
Nitrite: NEGATIVE
Protein, ur: 100 mg/dL — AB
Specific Gravity, Urine: 1.027 (ref 1.005–1.030)
pH: 5 (ref 5.0–8.0)

## 2024-03-10 LAB — CBC
HCT: 52.8 % — ABNORMAL HIGH (ref 36.0–46.0)
HCT: 40 % (ref 36.0–46.0)
Hemoglobin: 17.5 g/dL — ABNORMAL HIGH (ref 12.0–15.0)
Hemoglobin: 13.5 g/dL (ref 12.0–15.0)
MCH: 29.1 pg (ref 26.0–34.0)
MCH: 29.4 pg (ref 26.0–34.0)
MCHC: 33.1 g/dL (ref 30.0–36.0)
MCHC: 33.8 g/dL (ref 30.0–36.0)
MCV: 87.9 fL (ref 80.0–100.0)
MCV: 87.1 fL (ref 80.0–100.0)
Platelets: 378 10*3/uL (ref 150–400)
Platelets: 219 10*3/uL (ref 150–400)
RBC: 6.01 MIL/uL — ABNORMAL HIGH (ref 3.87–5.11)
RBC: 4.59 MIL/uL (ref 3.87–5.11)
RDW: 12.1 % (ref 11.5–15.5)
RDW: 12.1 % (ref 11.5–15.5)
WBC: 11 10*3/uL — ABNORMAL HIGH (ref 4.0–10.5)
WBC: 9.4 10*3/uL (ref 4.0–10.5)
nRBC: 0 % (ref 0.0–0.2)
nRBC: 0 % (ref 0.0–0.2)

## 2024-03-10 LAB — LIPASE, BLOOD: Lipase: 37 U/L (ref 11–51)

## 2024-03-10 MED ORDER — PROMETHAZINE (PHENERGAN) 6.25MG IN NS 50ML IVPB
6.2500 mg | Freq: Three times a day (TID) | INTRAVENOUS | Status: DC | PRN
  Administered 2024-03-11: 6.25 mg via INTRAVENOUS
  Filled 2024-03-10: qty 50

## 2024-03-10 MED ORDER — HEPARIN SODIUM (PORCINE) 5000 UNIT/ML IJ SOLN
5000.0000 [IU] | Freq: Three times a day (TID) | INTRAMUSCULAR | Status: DC
Start: 2024-03-10 — End: 2024-03-12
  Administered 2024-03-10 – 2024-03-12 (×5): 5000 [IU] via SUBCUTANEOUS
  Filled 2024-03-10 (×5): qty 1

## 2024-03-10 MED ORDER — EZETIMIBE 10 MG PO TABS
10.0000 mg | ORAL_TABLET | Freq: Every day | ORAL | Status: DC
  Administered 2024-03-11: 10 mg via ORAL
  Filled 2024-03-10: qty 1

## 2024-03-10 MED ORDER — FENTANYL CITRATE PF 50 MCG/ML IJ SOSY
25.0000 ug | PREFILLED_SYRINGE | Freq: Once | INTRAMUSCULAR | Status: AC
  Administered 2024-03-10: 25 ug via INTRAVENOUS
  Filled 2024-03-10: qty 1

## 2024-03-10 MED ORDER — ROSUVASTATIN CALCIUM 20 MG PO TABS
40.0000 mg | ORAL_TABLET | Freq: Every day | ORAL | Status: DC
  Administered 2024-03-11: 40 mg via ORAL
  Filled 2024-03-10: qty 2

## 2024-03-10 MED ORDER — POLYETHYLENE GLYCOL 3350 17 G PO PACK: 17.0000 g | PACK | Freq: Every day | ORAL | Status: DC | PRN

## 2024-03-10 MED ORDER — LACTATED RINGERS IV BOLUS
1000.0000 mL | Freq: Once | INTRAVENOUS | Status: AC
  Administered 2024-03-10: 1000 mL via INTRAVENOUS

## 2024-03-10 MED ORDER — ACETAMINOPHEN 650 MG RE SUPP: 650.0000 mg | Freq: Four times a day (QID) | RECTAL | Status: DC | PRN

## 2024-03-10 MED ORDER — SODIUM CHLORIDE 0.9 % IV BOLUS
1000.0000 mL | Freq: Once | INTRAVENOUS | Status: AC
  Administered 2024-03-10: 1000 mL via INTRAVENOUS

## 2024-03-10 MED ORDER — MORPHINE SULFATE (PF) 4 MG/ML IV SOLN
4.0000 mg | Freq: Once | INTRAVENOUS | Status: AC
  Administered 2024-03-10: 4 mg via INTRAVENOUS
  Filled 2024-03-10: qty 1

## 2024-03-10 MED ORDER — ACETAMINOPHEN 325 MG PO TABS
650.0000 mg | ORAL_TABLET | Freq: Four times a day (QID) | ORAL | Status: DC | PRN
  Administered 2024-03-10 – 2024-03-11 (×2): 650 mg via ORAL
  Filled 2024-03-10 (×2): qty 2

## 2024-03-10 MED ORDER — SODIUM CHLORIDE 0.9% FLUSH
3.0000 mL | Freq: Two times a day (BID) | INTRAVENOUS | Status: DC
  Administered 2024-03-10 – 2024-03-11 (×3): 3 mL via INTRAVENOUS

## 2024-03-10 MED ORDER — ONDANSETRON HCL 4 MG/2ML IJ SOLN
4.0000 mg | Freq: Once | INTRAMUSCULAR | Status: AC
  Administered 2024-03-10: 4 mg via INTRAVENOUS
  Filled 2024-03-10: qty 2

## 2024-03-10 MED ORDER — ONDANSETRON HCL 4 MG/2ML IJ SOLN
4.0000 mg | Freq: Three times a day (TID) | INTRAMUSCULAR | Status: DC | PRN
  Administered 2024-03-11 (×2): 4 mg via INTRAVENOUS
  Filled 2024-03-10 (×2): qty 2

## 2024-03-10 MED ORDER — SODIUM CHLORIDE 0.9 % IV SOLN: INTRAVENOUS | Status: AC

## 2024-03-10 MED ORDER — BUDESON-GLYCOPYRROL-FORMOTEROL 160-9-4.8 MCG/ACT IN AERO
2.0000 | INHALATION_SPRAY | Freq: Two times a day (BID) | RESPIRATORY_TRACT | Status: DC
  Administered 2024-03-11 – 2024-03-12 (×3): 2 via RESPIRATORY_TRACT
  Filled 2024-03-10: qty 5.9

## 2024-03-10 MED ORDER — ALBUTEROL SULFATE (2.5 MG/3ML) 0.083% IN NEBU: 2.5000 mg | INHALATION_SOLUTION | Freq: Four times a day (QID) | RESPIRATORY_TRACT | Status: DC | PRN

## 2024-03-10 NOTE — ED Provider Notes (Signed)
 Signout received on this 78 year old female.  See previous note for full details.  Pending CT scan and then call for admission. Physical Exam  BP (!) 144/88   Pulse 83   Temp 98.2 F (36.8 C)   Resp 16   Ht 5\' 2"  (1.575 m)   Wt 49.9 kg   SpO2 94%   BMI 20.12 kg/m     Procedures  Procedures  ED Course / MDM    Medical Decision Making Amount and/or Complexity of Data Reviewed Labs: ordered. Radiology: ordered.  Risk Prescription drug management. Decision regarding hospitalization.   CT scan without acute concerns. Will discuss with hospitalist due to AKI and concern for dehydration. Discussed with hospitalist will evaluate patient for admission.       Lucina Sabal, PA-C 03/10/24 1740    Wynetta Heckle, MD 03/11/24 [phone]

## 2024-03-10 NOTE — ED Notes (Signed)
 Patient transported to CT

## 2024-03-10 NOTE — Progress Notes (Signed)
 New Admission Note:    Arrival Method: ED Stretcher Mental Orientation: a/ox4 Telemetry: box 6  Assessment:completed Skin: intact IV: left Forearm Pain:n/a Tubes: n/a Safety Measures: nonskid socks Admission: completed Orientation: Patient has been oriented to the room, unit and staff.  Family: n/a Belongings: at bedside  Orders have been reviewed and implemented. Will continue to monitor the patient. Call light has been placed within reach and bed alarm has been activated.   Winferd Hatter BSN, RN-BC Phone number: [phone]

## 2024-03-10 NOTE — ED Provider Notes (Signed)
 [facility] Provider Note   CSN: [id number] Arrival date & time: 03/10/24  1040     History  Chief Complaint  Patient presents with   Emesis   Nausea   Abdominal Pain    Katie [name] is a 78 y.o. female.  The history is provided by the patient and medical records. No language interpreter was used.  Emesis Associated symptoms: abdominal pain   Abdominal Pain Associated symptoms: vomiting      78 year old female history of tobacco use, CHF, hypertension, AAA, kidney cancer, A-fib, intractable nausea and vomiting presenting with complaint abdominal pain.  Patient states for the past 4 days she has had diffuse abdominal cramping with persistent nausea and vomiting.  She feels dehydrated she feels hungry.  She tries drinking fluid and sometimes she can keep it down but other times she has vomited up.  She has normal bowel movement without any diarrhea constipation she denies having fever or chills no urinary symptoms she denies any chest pain or shortness of breath.  She denies any recent sick contact.  She admits to tobacco use as well as use of cocaine.  Last use was 4 days ago.  No report of marijuana use.  Home Medications Prior to Admission medications   Medication Sig Start Date End Date Taking? Authorizing Provider  albuterol  (VENTOLIN  HFA) 108 (90 Base) MCG/ACT inhaler Inhale 2 puffs into the lungs every 6 (six) hours as needed for wheezing or shortness of breath. 02/08/20   Farris Hong, PA-C  cetirizine  (ZYRTEC  ALLERGY) 10 MG tablet Take 1 tablet (10 mg total) by mouth at bedtime. Patient not taking: Reported on 04/21/2023 03/05/23   Hunsucker, Archer Kobs, MD  ezetimibe  (ZETIA ) 10 MG tablet Take 10 mg by mouth daily. 06/11/22   [provider]  fluticasone  (FLONASE ) 50 MCG/ACT nasal spray Place 1 spray into both nostrils daily. Patient not taking: Reported on 04/21/2023 03/05/23   Hunsucker, Archer Kobs, MD   Fluticasone -Umeclidin-Vilant (TRELEGY ELLIPTA ) 200-62.5-25 MCG/ACT AEPB Inhale 1 puff into the lungs daily. 03/05/23   Hunsucker, Archer Kobs, MD  losartan  (COZAAR ) 50 MG tablet Take 50 mg by mouth daily.    [provider]  metoCLOPramide  (REGLAN ) 10 MG tablet Take 1 tablet (10 mg total) by mouth 4 (four) times daily -  before meals and at bedtime. Patient not taking: Reported on 04/21/2023 06/20/22   Mapp, Tavien, MD  metoprolol  tartrate (LOPRESSOR ) 50 MG tablet Take 50 mg by mouth 2 (two) times daily. 03/02/23   [provider]  pantoprazole  (PROTONIX ) 40 MG tablet Take 1 tablet (40 mg total) by mouth daily. Patient not taking: Reported on 04/21/2023 08/18/22   Zehr, Jessica D, PA-C  promethazine  (PHENERGAN ) 25 MG tablet Take 25 mg by mouth every 6 (six) hours as needed for nausea or vomiting.    [provider]  rosuvastatin  (CRESTOR ) 40 MG tablet Take 40 mg by mouth daily. 06/11/22   [provider]  sucralfate  (CARAFATE ) 1 GM/10ML suspension Take 10 mLs (1 g total) by mouth 4 (four) times daily -  with meals and at bedtime. Patient not taking: Reported on 04/21/2023 06/20/22   Mapp, Tavien, MD      Allergies    Patient has no known allergies.    Review of Systems   Review of Systems  Gastrointestinal:  Positive for abdominal pain and vomiting.  All other systems reviewed and are negative.   Physical Exam Updated Vital Signs BP Aaron Aas)  133/98 (BP Location: Right Arm)   Pulse (!) 108   Temp 98.5 F (36.9 C)   Resp 16   Ht 5\' 2"  (1.575 m)   Wt 49.9 kg   SpO2 96%   BMI 20.12 kg/m  Physical Exam Vitals and nursing note reviewed.  Constitutional:      General: She is not in acute distress.    Appearance: She is well-developed.     Comments: Appears uncomfortable  HENT:     Head: Atraumatic.  Eyes:     Conjunctiva/sclera: Conjunctivae normal.  Cardiovascular:     Rate and Rhythm: Tachycardia present.  Pulmonary:     Effort: Pulmonary effort is normal.   Abdominal:     General: Bowel sounds are normal.     Palpations: Abdomen is soft.     Tenderness: There is generalized abdominal tenderness. There is no guarding or rebound. Negative signs include Murphy's sign and Rovsing's sign.  Musculoskeletal:     Cervical back: Neck supple.  Skin:    Findings: No rash.  Neurological:     Mental Status: She is alert.  Psychiatric:        Mood and Affect: Mood normal.     ED Results / Procedures / Treatments   Labs (all labs ordered are listed, but only abnormal results are displayed) Labs Reviewed  COMPREHENSIVE METABOLIC PANEL WITH GFR - Abnormal; Notable for the following components:      Result Value   Chloride 95 (*)    Glucose, Bld 134 (*)    BUN 34 (*)    Creatinine, Ser 2.04 (*)    Calcium  10.6 (*)    Total Protein 8.9 (*)    AST 44 (*)    GFR, Estimated 25 (*)    Anion gap 17 (*)    All other components within normal limits  CBC - Abnormal; Notable for the following components:   WBC 11.0 (*)    RBC 6.01 (*)    Hemoglobin 17.5 (*)    HCT 52.8 (*)    All other components within normal limits  LIPASE, BLOOD  URINALYSIS, ROUTINE W REFLEX MICROSCOPIC  RAPID URINE DRUG SCREEN, HOSP PERFORMED    EKG None  Radiology No results found.  Procedures Procedures    Medications Ordered in ED Medications - No data to display  ED Course/ Medical Decision Making/ A&P                                 Medical Decision Making Amount and/or Complexity of Data Reviewed Labs: ordered.   BP (!) 133/98 (BP Location: Right Arm)   Pulse (!) 108   Temp 98.5 F (36.9 C)   Resp 16   Ht 5\' 2"  (1.575 m)   Wt 49.9 kg   SpO2 96%   BMI 20.12 kg/m   39:28 PM 78 year old female history of tobacco use, CHF, hypertension, AAA, kidney cancer, A-fib, intractable nausea and vomiting presenting with complaint abdominal pain.  Patient states for the past 4 days she has had diffuse abdominal cramping with persistent nausea and vomiting.   She feels dehydrated she feels hungry.  She tries drinking fluid and sometimes she can keep it down but other times she has vomited up.  She has normal bowel movement without any diarrhea constipation she denies having fever or chills no urinary symptoms she denies any chest pain or shortness of breath.  She denies any recent  sick contact.  She admits to tobacco use as well as use of cocaine.  Last use was 4 days ago.  No report of marijuana use.  On exam patient appears slightly uncomfortable.  She has some mild diffuse abdominal tenderness on palpation.  She is tachycardic.  Vital signs shows heart rate of 108.  She is afebrile no hypoxia.  Workup was started  -Labs ordered, independently viewed and interpreted by me.  Labs remarkable for creatinine of 2.04 markedly worsened since previous.  Evidence of an anion gap likely from dehydration.  Blood is hemoconcentrated.  Urinalysis without signs of UTI. -The patient was maintained on a cardiac monitor.  I personally viewed and interpreted the cardiac monitored which showed an underlying rhythm of: Sinus tachycardia -Imaging including abdominal pelvis CT scan obtained -This patient presents to the ED for concern of abdominal pain, this involves an extensive number of treatment options, and is a complaint that carries with it a high risk of complications and morbidity.  The differential diagnosis includes gastroparesis, viral GI, colitis, diverticulitis, pancreatitis, appendicitis, cholecystitis, drug-induced, UTI -Co morbidities that complicate the patient evaluation includes tobacco use, CHF, hypertension, kidney pain -Treatment includes IV fluid, antinausea medication -Reevaluation of the patient after these medicines showed that the patient improved -PCP office notes or outside notes reviewed -Discussion with oncoming provider who will follow-up on CT results and likely hospital admission for dehydration -Escalation to admission/observation  considered: dispo pending.          Final Clinical Impression(s) / ED Diagnoses Final diagnoses:  None    Rx / DC Orders ED Discharge Orders     None         Debbra Fairy, PA-C 03/10/24 1515    Long, Shereen Dike, MD 03/11/24 [phone]

## 2024-03-10 NOTE — ED Triage Notes (Addendum)
 Pt. Stated, Katie [name] had N/V with stomach burning for 4 days. I have been able to drink and had some broth. But most of time it will come back up. I did cocaine on [facility]

## 2024-03-10 NOTE — H&P (Signed)
 History and Physical   Katie [name] ZOX:[id number] DOB: 05-19-1946 DOA: 03/10/2024  PCP: Ulysees Gander, MD   Patient coming from: Home  Chief Complaint: Abdominal pain, nausea, vomiting  HPI: Katie [name] is a 78 y.o. female with medical history significant of hypertension, hyperlipidemia, CKD 3A, atrial fibrillation, mitral regurgitation status post mitral valve repair, chronic diastolic CHF, depression, anxiety, QTc prolongation, AAA status post repair, COPD presenting with worsening abdominal pain with nausea and vomiting.  Patient reports 4 days of cramping abdominal pain.  Reports associated nausea vomiting at that time with minimal p.o. intake.  She is intermittently been able to keep down fluids.  She does report feeling dehydrated and hungry.  She denies fevers, chills, urinary symptoms, constipation, diarrhea, sick contacts.  She further denies chest pain, shortness of breath.  ED Course: Vital signs in the ED notable for heart rate in the 80s-100s, blood pressure in the 130s-140s systolic.  Lab workup included CMP with chloride 93, BUN 34, creatinine elevated to 2.0 from baseline at 1.4, glucose 134, normal bicarb, anion gap 17, calcium  10.6, protein 8.9, albumin  4.4, AST 44.  CBC with mild leukocytosis to 11, hemoglobin 17.5, platelets 378.  Lipase normal.  UDS positive for cocaine.  Urinalysis with hemoglobin, protein, trace leukocytes.  CT of the abdomen and pelvis showed stable status post nephrectomy changes without any evidence of tumor recurrence.  Also showed stable aorto by iliac stent with infrarenal AAA which is stable at 4.4 cm, diverticulosis, stable pleural plaques.  Patient received fentanyl , morphine , Zofran , 1 L IV fluids in the ED.  Review of Systems: As per HPI otherwise all other systems reviewed and are negative.  Past Medical History:  Diagnosis Date   AAA (abdominal aortic aneurysm) (HCC)    AKI (acute kidney injury) (HCC)    Arthritis    Chest pain  04/17/2016   Cholelithiasis 2011   s/p cholescystectomy    Chronic diastolic congestive heart failure (HCC)    COPD (chronic obstructive pulmonary disease) (HCC)    GERD (gastroesophageal reflux disease)    History of renal cell carcinoma    Hypercholesteremia    Hypertension    Hypertension    Mitral regurgitation    Nausea and vomiting 06/19/2022   Pneumonia 1980's   Renal cell carcinoma 01/2010   s/p right radical nephrectomy 01/2010,  followed by [facility]   S/P minimally invasive mitral valve repair 04/20/2019   Complex valvuloplasty including triangular resection of flail segment of posterior leaflet, artificial Gore-tex neochord placement x6 and 30 mm Sorin Memo 4D ring annuloplasty via right mini thoracotomy approach   Shortness of breath    Tuberculosis    Tests positive for PPD. dad had h/o TB.    Past Surgical History:  Procedure Laterality Date   ABDOMINAL AORTIC ENDOVASCULAR STENT GRAFT N/A 01/10/2022   Procedure: ABDOMINAL AORTIC ENDOVASCULAR STENT GRAFT;  Surgeon: Young Hensen, MD;  Location: [facility] OR;  Service: Vascular;  Laterality: N/A;   APPENDECTOMY  1970's   BUBBLE STUDY  03/18/2019   Procedure: BUBBLE STUDY;  Surgeon: Elmyra Haggard, MD;  Location: [facility] ENDOSCOPY;  Service: Cardiovascular;;   CARDIAC CATHETERIZATION  09/2011   CHOLECYSTECTOMY  01/2010   DIAGNOSTIC LAPAROSCOPY     KIDNEY SURGERY     LEFT AND RIGHT HEART CATHETERIZATION WITH CORONARY ANGIOGRAM N/A 09/30/2011   Procedure: LEFT AND RIGHT HEART CATHETERIZATION WITH CORONARY ANGIOGRAM;  Surgeon: Dorothye Gathers, MD;  Location: [facility] CATH LAB;  Service: Cardiovascular;  Laterality:  N/A;   MITRAL VALVE REPAIR Right 04/20/2019   Procedure: MINIMALLY INVASIVE MITRAL VALVE REPAIR (MVR) USING MEMO 4D SIZE 30;  Surgeon: Gardenia Jump, MD;  Location: [facility] OR;  Service: Open Heart Surgery;  Laterality: Right;   MULTIPLE EXTRACTIONS WITH ALVEOLOPLASTY  10/06/2011   Procedure: MULTIPLE EXTRACION WITH  ALVEOLOPLASTY;  Surgeon: Carol Chroman, DDS;  Location: [facility] OR;  Service: Oral Surgery;  Laterality: N/A;  Multiple extraction of tooth #'s 1, 6, 8, 10, 11, 22, 23, 26, 27, 28, 29 with alveoloplasty and Upper right buccal exostoses reductions.   NEPHRECTOMY RADICAL  01/2010   right    ORIF WRIST FRACTURE Right 02/17/2022   Procedure: OPEN REDUCTION INTERNAL FIXATION (ORIF) WRIST FRACTURE;  Surgeon: Arvil Birks, MD;  Location: [facility] OR;  Service: Orthopedics;  Laterality: Right;  with IV sedation   OVARIAN CYST REMOVAL  1970's   "went through belly button"   RIGHT/LEFT HEART CATH AND CORONARY ANGIOGRAPHY N/A 03/22/2019   Procedure: RIGHT/LEFT HEART CATH AND CORONARY ANGIOGRAPHY;  Surgeon: Lucendia Rusk, MD;  Location: [facility] INVASIVE CV LAB;  Service: Cardiovascular;  Laterality: N/A;   TEE WITHOUT CARDIOVERSION  10/01/2011   Procedure: TRANSESOPHAGEAL ECHOCARDIOGRAM (TEE);  Surgeon: Lucendia Rusk;  Location: [facility] ENDOSCOPY;  Service: Cardiovascular;  Laterality: N/A;   TEE WITHOUT CARDIOVERSION N/A 03/18/2019   Procedure: TRANSESOPHAGEAL ECHOCARDIOGRAM (TEE);  Surgeon: Elmyra Haggard, MD;  Location: [facility] ENDOSCOPY;  Service: Cardiovascular;  Laterality: N/A;   TEE WITHOUT CARDIOVERSION N/A 04/20/2019   Procedure: TRANSESOPHAGEAL ECHOCARDIOGRAM (TEE);  Surgeon: Gardenia Jump, MD;  Location: [facility] OR;  Service: Open Heart Surgery;  Laterality: N/A;   TUBAL LIGATION  1970's   US  ECHOCARDIOGRAPHY  09/2011    Social History  reports that she has been smoking cigarettes. She started smoking about 58 years ago. She has a 26.5 pack-year smoking history. She has been exposed to tobacco smoke. She has never used smokeless tobacco. She reports that she does not currently use alcohol. She reports that she does not currently use drugs after having used the following drugs: Cocaine.  No Known Allergies  Family History  Problem Relation Age of Onset   COPD Mother        DECEASED/SMOKED   Diabetes Brother     Diabetes Brother    Colon cancer Neg Hx    Rectal cancer Neg Hx    Esophageal cancer Neg Hx    Stomach cancer Neg Hx   Reviewed on admission  Prior to Admission medications   Medication Sig Start Date End Date Taking? Authorizing Provider  albuterol  (VENTOLIN  HFA) 108 (90 Base) MCG/ACT inhaler Inhale 2 puffs into the lungs every 6 (six) hours as needed for wheezing or shortness of breath. 02/08/20   Farris Hong, PA-C  cetirizine  (ZYRTEC  ALLERGY) 10 MG tablet Take 1 tablet (10 mg total) by mouth at bedtime. Patient not taking: Reported on 04/21/2023 03/05/23   Hunsucker, Archer Kobs, MD  ezetimibe  (ZETIA ) 10 MG tablet Take 10 mg by mouth daily. 06/11/22   [provider]  fluticasone  (FLONASE ) 50 MCG/ACT nasal spray Place 1 spray into both nostrils daily. Patient not taking: Reported on 04/21/2023 03/05/23   Hunsucker, Archer Kobs, MD  Fluticasone -Umeclidin-Vilant (TRELEGY ELLIPTA ) 200-62.5-25 MCG/ACT AEPB Inhale 1 puff into the lungs daily. 03/05/23   Hunsucker, Archer Kobs, MD  losartan  (COZAAR ) 50 MG tablet Take 50 mg by mouth daily.    [provider]  metoCLOPramide  (REGLAN ) 10 MG tablet Take 1 tablet (  10 mg total) by mouth 4 (four) times daily -  before meals and at bedtime. Patient not taking: Reported on 04/21/2023 06/20/22   Mapp, Tavien, MD  metoprolol  tartrate (LOPRESSOR ) 50 MG tablet Take 50 mg by mouth 2 (two) times daily. 03/02/23   [provider]  pantoprazole  (PROTONIX ) 40 MG tablet Take 1 tablet (40 mg total) by mouth daily. Patient not taking: Reported on 04/21/2023 08/18/22   Zehr, Jessica D, PA-C  promethazine  (PHENERGAN ) 25 MG tablet Take 25 mg by mouth every 6 (six) hours as needed for nausea or vomiting.    [provider]  rosuvastatin  (CRESTOR ) 40 MG tablet Take 40 mg by mouth daily. 06/11/22   [provider]  sucralfate  (CARAFATE ) 1 GM/10ML suspension Take 10 mLs (1 g total) by mouth 4 (four) times daily -  with meals and at  bedtime. Patient not taking: Reported on 04/21/2023 06/20/22   Mapp, Tavien, MD    Physical Exam: Vitals:   03/10/24 1043 03/10/24 1058 03/10/24 1506  BP: (!) 133/98  (!) 144/88  Pulse: (!) 108  83  Resp: 16  16  Temp: 98.5 F (36.9 C)  98.2 F (36.8 C)  SpO2: 96%  94%  Weight:  49.9 kg   Height:  5\' 2"  (1.575 m)     Physical Exam Constitutional:      General: She is not in acute distress.    Appearance: Normal appearance.  HENT:     Head: Normocephalic and atraumatic.     Mouth/Throat:     Mouth: Mucous membranes are moist.     Pharynx: Oropharynx is clear.  Eyes:     Extraocular Movements: Extraocular movements intact.     Pupils: Pupils are equal, round, and reactive to light.  Cardiovascular:     Rate and Rhythm: Normal rate and regular rhythm.     Pulses: Normal pulses.     Heart sounds: Normal heart sounds.  Pulmonary:     Effort: Pulmonary effort is normal. No respiratory distress.     Breath sounds: Normal breath sounds.  Abdominal:     General: Bowel sounds are normal. There is no distension.     Palpations: Abdomen is soft.     Tenderness: There is no abdominal tenderness.  Musculoskeletal:        General: No swelling or deformity.  Skin:    General: Skin is warm and dry.  Neurological:     General: No focal deficit present.     Mental Status: Mental status is at baseline.    Labs on Admission: I have personally reviewed following labs and imaging studies  CBC: Recent Labs  Lab 03/10/24 1110  WBC 11.0*  HGB 17.5*  HCT 52.8*  MCV 87.9  PLT 378    Basic Metabolic Panel: Recent Labs  Lab 03/10/24 1110  NA 135  K 4.2  CL 95*  CO2 23  GLUCOSE 134*  BUN 34*  CREATININE 2.04*  CALCIUM  10.6*    GFR: Estimated Creatinine Clearance: 18.5 mL/min (A) (by C-G formula based on SCr of 2.04 mg/dL (H)).  Liver Function Tests: Recent Labs  Lab 03/10/24 1110  AST 44*  ALT 34  ALKPHOS 92  BILITOT 1.0  PROT 8.9*  ALBUMIN  4.4    Urine  analysis:    Component Value Date/Time   COLORURINE YELLOW 03/10/2024 1110   APPEARANCEUR HAZY (A) 03/10/2024 1110   LABSPEC 1.027 03/10/2024 1110   PHURINE 5.0 03/10/2024 1110   GLUCOSEU NEGATIVE 03/10/2024  1110   GLUCOSEU NEGATIVE 04/29/2017 1122   HGBUR SMALL (A) 03/10/2024 1110   BILIRUBINUR NEGATIVE 03/10/2024 1110   BILIRUBINUR negative 06/11/2018 0911   KETONESUR NEGATIVE 03/10/2024 1110   PROTEINUR 100 (A) 03/10/2024 1110   UROBILINOGEN 0.2 02/27/2022 1912   NITRITE NEGATIVE 03/10/2024 1110   LEUKOCYTESUR TRACE (A) 03/10/2024 1110    Radiological Exams on Admission: CT ABDOMEN PELVIS WO CONTRAST Result Date: 03/10/2024 CLINICAL DATA:  Acute, non localized abdominal pain with nausea and vomiting. EXAM: CT ABDOMEN AND PELVIS WITHOUT CONTRAST TECHNIQUE: Multidetector CT imaging of the abdomen and pelvis was performed following the standard protocol without IV contrast. RADIATION DOSE REDUCTION: This exam was performed according to the departmental dose-optimization program which includes automated exposure control, adjustment of the mA and/or kV according to patient size and/or use of iterative reconstruction technique. COMPARISON:  04/20/2023 FINDINGS: Lower chest: Stable calcified pleural plaques at the right lung base. Hepatobiliary: Stable liver cysts. These do not need imaging follow-up. Cholecystectomy clips. Pancreas: Unremarkable. No pancreatic ductal dilatation or surrounding inflammatory changes. Spleen: Normal in size without focal abnormality. Adrenals/Urinary Tract: Stable post nephrectomy changes on the right. Stable surgically absent right adrenal gland. No evidence of tumor recurrence. Stable mild left adrenal hyperplasia with no mass. Unremarkable left kidney, ureter and urinary bladder. Stomach/Bowel: Mild colonic diverticulosis without evidence of diverticulitis. Surgically absent appendix. Unremarkable stomach and small bowel. Vascular/Lymphatic: Stable aorto bi-iliac  stents within an infrarenal abdominal aortic aneurysm measuring 4.4 cm in maximum diameter, previously 4.5 cm. No enlarged lymph nodes. Reproductive: Uterus and bilateral adnexa are unremarkable. Other: No abdominal wall hernia or abnormality. No abdominopelvic ascites. Musculoskeletal: Stable lumbar and lower thoracic spine degenerative changes. Stable old L3 compression fracture. No pars defects, acute fractures or subluxations. IMPRESSION: 1. No acute abnormality. 2. Stable post nephrectomy changes on the right with no evidence of tumor recurrence. 3. Stable aorto bi-iliac stents within an infrarenal abdominal aortic aneurysm measuring 4.4 cm in maximum diameter, previously 4.5 cm. 4. Mild colonic diverticulosis without evidence of diverticulitis. 5. Stable calcified pleural plaques at the right lung base. These could be due to previous trauma or previous asbestos exposure. Electronically Signed   By: Catherin Closs M.D.   On: 03/10/2024 15:49   EKG: Not yet performed  Assessment/Plan Principal Problem:   Acute renal failure superimposed on stage 3a chronic kidney disease (HCC) Active Problems:   AAA (abdominal aortic aneurysm) without rupture (HCC)   Essential hypertension   COPD   HLD (hyperlipidemia)   Severe mitral regurgitation   Depression with anxiety   CKD stage G3a/A1, GFR 45-59 and albumin  creatinine ratio <30 mg/g (HCC)   Chronic diastolic congestive heart failure (HCC)   S/P minimally invasive mitral valve repair   Atrial fibrillation (HCC)   QT prolongation   AKI on CKD 3A Nausea, vomiting, abdominal pain  > Patient with 4 days of nausea, vomiting, abdominal pain.  Found to have AKI in the ED with creatinine elevated to 2.0 from baseline of 1.4. > No clear etiology for her GI symptoms.  CT abdomen pelvis was unrevealing, did show stable status post nephrectomy changes, stable status post AAA stent changes. > Received Zofran , fentanyl , morphine , 1 L IV fluids in the ED with  improvement.  Still unable to tolerate p.o. - Monitor on telemetry overnight - Continue gentle IV fluids considering diastolic CHF history - Check lactic acid given abdominal pain and elevated anion gap - Can add back as needed pain medications - Hold off  on as needed antiemetic pending EKG with history of QTc prolongation - Trend renal function at electrolytes ADDENDUM > QTc okay - Zofran  PRN, Phenergan  PRN for breakthrough x2 doses - AM EKG  Erythrocytosis Leukocytosis > Suspect likely secondary to hemoconcentration in the setting of dehydration.  Do not suspect true leukocytosis and anticipate this will improve with IV fluids alone.  No evidence of any infection on CT abdomen pelvis. -Trend CBC  Hypertension - Hold losartan  in the setting of above - Continue home metoprolol   Hyperlipidemia - Continue home rosuvastatin  and Zetia   Atrial fibrillation - Continue metoprolol  - Not on anticoagulation  Mitral regurgitation > Status post mitral valve repair - Noted  Chronic diastolic CHF > Last echo in 2023 with EF 65-70%, indeterminate diastolic function, mildly reduced RV function, status post mitral valve repair with moderate stenosis. - Not on a diuretic - Continue metoprolol   QTc prolongation > History of this rechecking EKG here. - Trend EKG  AAA > Status post repair - Repair stable on CT - Noted  COPD - Replace home Trelegy with formulary Breztri - Continue home albuterol   DVT prophylaxis: Heparin  Code Status:   Full Family Communication:  None on admission  Disposition Plan:   Patient is from:  Home  Anticipated DC to:  Home  Anticipated DC date:  1 to 3 days  Anticipated DC barriers: None  Consults called:  None Admission status:  Observation, telemetry  Severity of Illness: The appropriate patient status for this patient is OBSERVATION. Observation status is judged to be reasonable and necessary in order to provide the required intensity of service  to ensure the patient's safety. The patient's presenting symptoms, physical exam findings, and initial radiographic and laboratory data in the context of their medical condition is felt to place them at decreased risk for further clinical deterioration. Furthermore, it is anticipated that the patient will be medically stable for discharge from the hospital within 2 midnights of admission.    Johnetta Nab MD [facility]  How to contact the [facility] Attending or Consulting provider 7A - 7P or covering provider during after hours 7P -7A, for this patient?   Check the care team in [facility] and look for a) attending/consulting [facility] provider listed and b) the [facility] team listed Log into [email or web address] and use [place]'s universal password to access. If you do not have the password, please contact the hospital operator. Locate the [facility] provider you are looking for under [facility] and page to a number that you can be directly reached. If you still have difficulty reaching the provider, please page the [facility] (Director on Call) for the Hospitalists listed on amion for assistance.  03/10/2024, 5:29 PM

## 2024-03-11 ENCOUNTER — Encounter (HOSPITAL_COMMUNITY): Payer: Self-pay | Admitting: Internal Medicine

## 2024-03-11 DIAGNOSIS — R748 Abnormal levels of other serum enzymes: Secondary | ICD-10-CM | POA: Diagnosis not present

## 2024-03-11 DIAGNOSIS — A0472 Enterocolitis due to Clostridium difficile, not specified as recurrent: Secondary | ICD-10-CM | POA: Diagnosis not present

## 2024-03-11 DIAGNOSIS — N1831 Chronic kidney disease, stage 3a: Secondary | ICD-10-CM | POA: Diagnosis not present

## 2024-03-11 DIAGNOSIS — N179 Acute kidney failure, unspecified: Secondary | ICD-10-CM | POA: Diagnosis not present

## 2024-03-11 DIAGNOSIS — I714 Abdominal aortic aneurysm, without rupture, unspecified: Secondary | ICD-10-CM | POA: Diagnosis not present

## 2024-03-11 LAB — LIPASE, BLOOD: Lipase: 83 U/L — ABNORMAL HIGH (ref 11–51)

## 2024-03-11 LAB — RESPIRATORY PANEL BY PCR

## 2024-03-11 LAB — COMPREHENSIVE METABOLIC PANEL WITH GFR
ALT: 135 U/L — ABNORMAL HIGH (ref 0–44)
ALT: 111 U/L — ABNORMAL HIGH (ref 0–44)
AST: 220 U/L — ABNORMAL HIGH (ref 15–41)
AST: 100 U/L — ABNORMAL HIGH (ref 15–41)
Albumin: 2.9 g/dL — ABNORMAL LOW (ref 3.5–5.0)
Albumin: 2.9 g/dL — ABNORMAL LOW (ref 3.5–5.0)
Alkaline Phosphatase: 72 U/L (ref 38–126)
Alkaline Phosphatase: 67 U/L (ref 38–126)
Anion gap: 8 (ref 5–15)
Anion gap: 6 (ref 5–15)
BUN: 32 mg/dL — ABNORMAL HIGH (ref 8–23)
BUN: 28 mg/dL — ABNORMAL HIGH (ref 8–23)
CO2: 24 mmol/L (ref 22–32)
CO2: 25 mmol/L (ref 22–32)
Calcium: 8.9 mg/dL (ref 8.9–10.3)
Calcium: 8.5 mg/dL — ABNORMAL LOW (ref 8.9–10.3)
Chloride: 102 mmol/L (ref 98–111)
Chloride: 105 mmol/L (ref 98–111)
Creatinine, Ser: 1.61 mg/dL — ABNORMAL HIGH (ref 0.44–1.00)
Creatinine, Ser: 1.58 mg/dL — ABNORMAL HIGH (ref 0.44–1.00)
GFR, Estimated: 33 mL/min — ABNORMAL LOW (ref >60)
GFR, Estimated: 34 mL/min — ABNORMAL LOW (ref >60)
Glucose, Bld: 100 mg/dL — ABNORMAL HIGH (ref 70–99)
Glucose, Bld: 88 mg/dL (ref 70–99)
Potassium: 3.3 mmol/L — ABNORMAL LOW (ref 3.5–5.1)
Potassium: 3.4 mmol/L — ABNORMAL LOW (ref 3.5–5.1)
Sodium: 134 mmol/L — ABNORMAL LOW (ref 135–145)
Sodium: 136 mmol/L (ref 135–145)
Total Bilirubin: 1.5 mg/dL — ABNORMAL HIGH (ref 0.0–1.2)
Total Bilirubin: 0.7 mg/dL (ref 0.0–1.2)
Total Protein: 5.9 g/dL — ABNORMAL LOW (ref 6.5–8.1)
Total Protein: 5.6 g/dL — ABNORMAL LOW (ref 6.5–8.1)

## 2024-03-11 LAB — LACTIC ACID, PLASMA: Lactic Acid, Venous: 1.3 mmol/L (ref 0.5–1.9)

## 2024-03-11 MED ORDER — TRAZODONE HCL 50 MG PO TABS
50.0000 mg | ORAL_TABLET | Freq: Every evening | ORAL | Status: DC | PRN
  Administered 2024-03-11: 50 mg via ORAL
  Filled 2024-03-11: qty 1

## 2024-03-11 MED ORDER — SODIUM CHLORIDE 0.9 % IV SOLN: INTRAVENOUS | Status: DC

## 2024-03-11 NOTE — Care Management Obs Status (Signed)
 MEDICARE OBSERVATION STATUS NOTIFICATION   Patient Details  Name: Katie [name] MRN: [id number] Date of Birth: [birth year]   Medicare Observation Status Notification Given:  Yes    Tom-Johnson, Angelique Ken, RN 03/11/2024, 2:04 PM

## 2024-03-11 NOTE — TOC CM/SW Note (Signed)
 Transition of Care [facility]) - Inpatient Brief Assessment   Patient Details  Name: Katie [name] MRN: [id number] Date of Birth: 1946/01/04  Transition of Care [facility]) CM/SW Contact:    Tom-Johnson, Angelique Ken, RN Phone Number: 03/11/2024, 11:02 AM   Clinical Narrative:  Patient presented to the ED with Abdominal Pains, Cramping with N/V. Found to have AKI and Dehydration. Creatinine on admit was 2.0 from baseline of 1.4. UDS positive for Cocaine.   From home with son, has three children. Independent with her care and drive self to and from her appointments. Has a cane at home.  PCP is Ulysees Gander, MD and uses [facility] on [address].   Patient not Medically ready for discharge.  CM will continue to follow as patient progresses with care towards discharge.      Transition of Care Asessment: Insurance and Status: Insurance coverage has been reviewed Patient has primary care physician: Yes Home environment has been reviewed: Yes Prior level of function:: Modified Independent Prior/Current Home Services: No current home services Social Drivers of Health Review: SDOH reviewed no interventions necessary Readmission risk has been reviewed: Yes Transition of care needs: no transition of care needs at this time

## 2024-03-11 NOTE — Plan of Care (Signed)
°  Problem: Clinical Measurements: Goal: Will remain free from infection Outcome: Progressing   Problem: Nutrition: Goal: Adequate nutrition will be maintained Outcome: Progressing   Problem: Coping: Goal: Level of anxiety will decrease Outcome: Progressing

## 2024-03-11 NOTE — Progress Notes (Signed)
 [facility] ROUNDING NOTE Katie [name] ZOX:[id number]  DOB: [birth year]  DOA: 03/10/2024  PCP: Ulysees Gander, MD  03/11/2024,7:10 AM  LOS: 0 days    Code Status: Full code F   from: home current Dispo: Home   78 year old female HTN HLD CKD 3B in the setting of right sided nephrectomy for renal cell CA Severe mitral valve insufficiency mitral valve repair performed 2020 abdominal aortic aneurysm status postrepair 2023 thyroid  nodule and hepatic cyst noted in 2023  5/22 came to hospital with nausea vomiting stomach upset in the setting of cocaine use on 5/20-BUN/creatinine 34/2.0 LFTs relatively normal on arrival WBC 11 hemoglobin 17 platelet 378 trace leukocytes negative nitrates CT abdomen/pelvis stable aortobiiliac stenting with infrarenal aortic aneurysm maximum diameter 4.4 stable post nephrectomy changes calcified pleural plaque right lung base Respiratory viral panel negative  Plan  GI illness ?  Diarrheal illness Rule out C. difficile/enterocolitis--hold antibiotics get C. difficile GI pathogen panel Saline 55 cc/H, hold antibiotics hold nonessential meds Vomiting is resolved-so continue the same and graduate to full regular diet if able to eat  Elevated lipase transaminitis Possibly secondary to diarrheal illness however need to rule out in the next 24 hours gallstone disease CT abdomen/pelvis however did not show any findings If no better in a.m. will obtain ultrasound abdomen/pelvis  Right-sided nephrectomy CKD 3B with AKI on admission Kidney function slightly improved avoid nephrotoxins  Mitral valve repair 2020 Aortic aneurysm status post repair 2023 Holding Toprol  50 twice daily losartan  50 other meds at this time--can resume when tolerating a regular diet  Cocaine use Counseled to quit  Incidental thyroid  nodule hepatic cyst Needs outpatient attention if not already addressed  DVT prophylaxis: Heparin   Status is: Observation The patient will require care spanning > 2  midnights and should be moved to inpatient because:   Will require further workup   Subjective: Awake alert hungry wants to eat Chest pain no fever no menance-several bouts of diarrhea No significant abdominal pain but see below Overall seems better than prior  Objective + exam Vitals:   03/10/24 1506 03/10/24 1911 03/10/24 2010 03/11/24 0450  BP: (!) 144/88 (!) 164/98 (!) 156/96 (!) 154/98  Pulse: 83 93 83 75  Resp: 16 18 18 18   Temp: 98.2 F (36.8 C) 98.2 F (36.8 C)  98.3 F (36.8 C)  TempSrc:  Oral    SpO2: 94% 100% 94% 100%  Weight:      Height:       Filed Weights   03/10/24 1058  Weight: 49.9 kg    Examination: EOMI NCAT no focal deficit no icterus no pallor no wheeze no rales no rhonchi Chest clear Slight tenderness in epigastrium no rebound Abdominal slightly distended No lower extremity edema ROM intact  Data Reviewed: reviewed   CBC    Component Value Date/Time   WBC 9.4 03/10/2024 2329   RBC 4.59 03/10/2024 2329   HGB 13.5 03/10/2024 2329   HCT 40.0 03/10/2024 2329   PLT 219 03/10/2024 2329   MCV 87.1 03/10/2024 2329   MCH 29.4 03/10/2024 2329   MCHC 33.8 03/10/2024 2329   RDW 12.1 03/10/2024 2329   LYMPHSABS 0.8 04/20/2023 1415   MONOABS 0.1 04/20/2023 1415   EOSABS 0.0 04/20/2023 1415   BASOSABS 0.0 04/20/2023 1415      Latest Ref Rng & Units 03/10/2024   11:29 PM 03/10/2024   11:10 AM 04/22/2023    4:38 AM  CMP  Glucose 70 - 99 mg/dL 811  134  85   BUN 8 - 23 mg/dL 32  34  26   Creatinine 0.44 - 1.00 mg/dL 1.61  0.96  0.45   Sodium 135 - 145 mmol/L 134  135  138   Potassium 3.5 - 5.1 mmol/L 3.3  4.2  3.8   Chloride 98 - 111 mmol/L 102  95  108   CO2 22 - 32 mmol/L 24  23  24    Calcium  8.9 - 10.3 mg/dL 8.9  40.9  8.7   Total Protein 6.5 - 8.1 g/dL 5.9  8.9    Total Bilirubin 0.0 - 1.2 mg/dL 1.5  1.0    Alkaline Phos 38 - 126 U/L 72  92    AST 15 - 41 U/L 220  44    ALT 0 - 44 U/L 135  34      Scheduled Meds:   budesonide -glycopyrrolate -formoterol   2 puff Inhalation BID   ezetimibe   10 mg Oral Daily   heparin   5,000 Units Subcutaneous Q8H   rosuvastatin   40 mg Oral Daily   sodium chloride  flush  3 mL Intravenous Q12H   Continuous Infusions:  sodium chloride  75 mL/hr at 03/10/24 1759   promethazine  (PHENERGAN ) injection (IM or IVPB)      Time  50  Verlie Glisson, MD  [facility]

## 2024-03-12 ENCOUNTER — Other Ambulatory Visit: Payer: Self-pay

## 2024-03-12 ENCOUNTER — Encounter (HOSPITAL_COMMUNITY): Payer: Self-pay | Admitting: Emergency Medicine

## 2024-03-12 ENCOUNTER — Emergency Department (HOSPITAL_COMMUNITY)
Admission: EM | Admit: 2024-03-12 | Discharge: 2024-03-13 | Disposition: A | Discharge status: Home / Self Care | Payer: Medicare (Managed Care) | Source: Home / Self Care | Attending: Emergency Medicine | Admitting: Emergency Medicine

## 2024-03-12 DIAGNOSIS — R509 Fever, unspecified: Secondary | ICD-10-CM | POA: Insufficient documentation

## 2024-03-12 DIAGNOSIS — R519 Headache, unspecified: Secondary | ICD-10-CM | POA: Insufficient documentation

## 2024-03-12 DIAGNOSIS — R42 Dizziness and giddiness: Secondary | ICD-10-CM | POA: Insufficient documentation

## 2024-03-12 DIAGNOSIS — R112 Nausea with vomiting, unspecified: Secondary | ICD-10-CM | POA: Insufficient documentation

## 2024-03-12 DIAGNOSIS — R350 Frequency of micturition: Secondary | ICD-10-CM | POA: Insufficient documentation

## 2024-03-12 DIAGNOSIS — N179 Acute kidney failure, unspecified: Secondary | ICD-10-CM | POA: Diagnosis not present

## 2024-03-12 DIAGNOSIS — N1831 Chronic kidney disease, stage 3a: Secondary | ICD-10-CM | POA: Diagnosis not present

## 2024-03-12 LAB — CBC
HCT: 38.9 % (ref 36.0–46.0)
HCT: 43.7 % (ref 36.0–46.0)
Hemoglobin: 13 g/dL (ref 12.0–15.0)
Hemoglobin: 14.3 g/dL (ref 12.0–15.0)
MCH: 29.8 pg (ref 26.0–34.0)
MCH: 29 pg (ref 26.0–34.0)
MCHC: 33.4 g/dL (ref 30.0–36.0)
MCHC: 32.7 g/dL (ref 30.0–36.0)
MCV: 89.2 fL (ref 80.0–100.0)
MCV: 88.6 fL (ref 80.0–100.0)
Platelets: 201 10*3/uL (ref 150–400)
Platelets: 244 10*3/uL (ref 150–400)
RBC: 4.36 MIL/uL (ref 3.87–5.11)
RBC: 4.93 MIL/uL (ref 3.87–5.11)
RDW: 12.1 % (ref 11.5–15.5)
RDW: 12 % (ref 11.5–15.5)
WBC: 5.8 10*3/uL (ref 4.0–10.5)
WBC: 8.2 10*3/uL (ref 4.0–10.5)
nRBC: 0 % (ref 0.0–0.2)
nRBC: 0 % (ref 0.0–0.2)

## 2024-03-12 LAB — URINALYSIS, ROUTINE W REFLEX MICROSCOPIC
Bacteria, UA: NONE SEEN
Bilirubin Urine: NEGATIVE
Glucose, UA: 50 mg/dL — AB
Ketones, ur: NEGATIVE mg/dL
Leukocytes,Ua: NEGATIVE
Nitrite: NEGATIVE
Protein, ur: NEGATIVE mg/dL
Specific Gravity, Urine: 1.008 (ref 1.005–1.030)
pH: 7 (ref 5.0–8.0)

## 2024-03-12 LAB — COMPREHENSIVE METABOLIC PANEL WITH GFR
ALT: 69 U/L — ABNORMAL HIGH (ref 0–44)
ALT: 70 U/L — ABNORMAL HIGH (ref 0–44)
AST: 42 U/L — ABNORMAL HIGH (ref 15–41)
AST: 38 U/L (ref 15–41)
Albumin: 2.9 g/dL — ABNORMAL LOW (ref 3.5–5.0)
Albumin: 3.8 g/dL (ref 3.5–5.0)
Alkaline Phosphatase: 60 U/L (ref 38–126)
Alkaline Phosphatase: 77 U/L (ref 38–126)
Anion gap: 8 (ref 5–15)
Anion gap: 10 (ref 5–15)
BUN: 23 mg/dL (ref 8–23)
BUN: 20 mg/dL (ref 8–23)
CO2: 26 mmol/L (ref 22–32)
CO2: 25 mmol/L (ref 22–32)
Calcium: 8.9 mg/dL (ref 8.9–10.3)
Calcium: 9.6 mg/dL (ref 8.9–10.3)
Chloride: 108 mmol/L (ref 98–111)
Chloride: 102 mmol/L (ref 98–111)
Creatinine, Ser: 1.7 mg/dL — ABNORMAL HIGH (ref 0.44–1.00)
Creatinine, Ser: 1.48 mg/dL — ABNORMAL HIGH (ref 0.44–1.00)
GFR, Estimated: 31 mL/min — ABNORMAL LOW (ref >60)
GFR, Estimated: 36 mL/min — ABNORMAL LOW (ref >60)
Glucose, Bld: 104 mg/dL — ABNORMAL HIGH (ref 70–99)
Glucose, Bld: 138 mg/dL — ABNORMAL HIGH (ref 70–99)
Potassium: 3.2 mmol/L — ABNORMAL LOW (ref 3.5–5.1)
Potassium: 3.5 mmol/L (ref 3.5–5.1)
Sodium: 142 mmol/L (ref 135–145)
Sodium: 137 mmol/L (ref 135–145)
Total Bilirubin: 0.4 mg/dL (ref 0.0–1.2)
Total Bilirubin: 1 mg/dL (ref 0.0–1.2)
Total Protein: 5.6 g/dL — ABNORMAL LOW (ref 6.5–8.1)
Total Protein: 7.6 g/dL (ref 6.5–8.1)

## 2024-03-12 LAB — LIPASE, BLOOD: Lipase: 51 U/L (ref 11–51)

## 2024-03-12 MED ORDER — LACTATED RINGERS IV BOLUS
1000.0000 mL | Freq: Once | INTRAVENOUS | Status: AC
  Administered 2024-03-12: 999 mL via INTRAVENOUS

## 2024-03-12 MED ORDER — FAMOTIDINE IN NACL 20-0.9 MG/50ML-% IV SOLN
20.0000 mg | Freq: Once | INTRAVENOUS | Status: AC
  Administered 2024-03-12: 20 mg via INTRAVENOUS
  Filled 2024-03-12: qty 50

## 2024-03-12 MED ORDER — ALUM & MAG HYDROXIDE-SIMETH 200-200-20 MG/5ML PO SUSP
30.0000 mL | Freq: Once | ORAL | Status: AC
  Administered 2024-03-12: 30 mL via ORAL
  Filled 2024-03-12: qty 30

## 2024-03-12 MED ORDER — LABETALOL HCL 5 MG/ML IV SOLN
20.0000 mg | Freq: Once | INTRAVENOUS | Status: AC
  Administered 2024-03-12: 20 mg via INTRAVENOUS
  Filled 2024-03-12: qty 4

## 2024-03-12 NOTE — ED Triage Notes (Signed)
 Pt presents to the ED via POV with complaints of abdominal pain and increased urinary frequency that started around 1300. Pt states that she was D/C from [facility] yesterday for AKI - she states the pain returned after consuming her first meal of the day around lunch time. A&Ox4 at this time. Denies CP or SOB.

## 2024-03-12 NOTE — Discharge Summary (Signed)
 Physician Discharge Summary  Katie [name] AVW:[id number] DOB: [birth year] DOA: 03/10/2024  PCP: Ulysees Gander, MD  Admit date: 03/10/2024 Discharge date: 03/12/2024  Time spent: 33 minutes  Recommendations for Outpatient Follow-up:  Requires outpatient Chem-12 CBC Strict return precautions prescribed for patient-unlikely to be pancreatitis given significant resolution and tolerating diet but may need outpatient MRCP if has any recurrent Consider imaging of liver and thyroid  in the outpatient setting as had incidental findings previously  Discharge Diagnoses:  MAIN problem for hospitalization   Enterocolitis  Please see below for itemized issues addressed in HOpsital- refer to other progress notes for clarity if needed  Discharge Condition: Improved  Diet recommendation: Heart healthy  Filed Weights   03/10/24 1058  Weight: 49.9 kg    History of present illness:  78 year old female HTN HLD CKD 3B in the setting of right sided nephrectomy for renal cell CA Severe mitral valve insufficiency mitral valve repair performed 2020 abdominal aortic aneurysm status postrepair 2023 thyroid  nodule and hepatic cyst noted in 2023   5/22 came to hospital with nausea vomiting stomach upset in the setting of cocaine use on 5/20-BUN/creatinine 34/2.0 LFTs relatively normal on arrival WBC 11 hemoglobin 17 platelet 378 trace leukocytes negative nitrates CT abdomen/pelvis stable aortobiiliac stenting with infrarenal aortic aneurysm maximum diameter 4.4 stable post nephrectomy changes calcified pleural plaque right lung base Respiratory viral panel negative   Plan   GI illness ?  Diarrheal illness Rule out C. difficile/enterocolitis--hold antibiotics--we did order C. difficile and GI pathogen panel but fortunately diarrhea resolved before all this raising concern that this was a self-limiting enteritis She was tolerating diet fully on discharge   Elevated lipase transaminitis Possibly  secondary to diarrheal illness however need to rule out in the next 24 hours gallstone disease CT abdomen/pelvis however did not show any findings Given improvement given I am less convinced that this is a pancreatitis as her LFTs have trended down and although this was on my differential and she had some mild tenderness I did not feel that this needed any further workup is just disease in the-- have warned her that if she has recurrent symptoms to seek attention and get imaging   Right-sided nephrectomy CKD 3B with AKI on admission Kidney function slightly improved avoid nephrotoxins   Mitral valve repair 2020 Aortic aneurysm status post repair 2023 Resume Toprol  and losartan  at this   Cocaine use Counseled to quit   Incidental thyroid  nodule hepatic cyst Needs outpatient attention if not already addressed  Discharge Exam: Vitals:   03/12/24 0458 03/12/24 1003  BP: (!) 158/86 96/63  Pulse: 70 78  Resp: 16   Temp:  98.3 F (36.8 C)  SpO2: 98% 100%    Subj on day of d/c   Awake coherent no distress EOMI NCAT no focal deficit no icterus no pallor no wheeze Abdo menance soft no rebound no guarding with a little bit of epigastric tenderness no rebound Bowel sounds are heard No lower extremity edema  Discharge Instructions   Discharge Instructions     Diet - low sodium heart healthy   Complete by: As directed    Discharge instructions   Complete by: As directed    You were hospitalized and we diagnosed you with probable colitis or food poisoning which might have caused your nausea vomiting and diarrhea which seems now to have resolved I have not really changed your meds but you do need labs in about a week as I am resuming your  blood pressure meds which can sometimes worsen dehydration you will need labs in a week It was less likely felt that you had any type of pancreas issue or liver issue it looks like you have had a gallbladder surgery in the past sometimes however you  can get stones in your pancreas causing discomfort so if you experience recurrent pain please come back to the emergency room From RN you are stable to go however use Tylenol  over-the-counter as needed short-term for pain and you can follow-up in the outpatient setting   Increase activity slowly   Complete by: As directed       Allergies as of 03/12/2024   No Known Allergies      Medication List     TAKE these medications    albuterol  108 (90 Base) MCG/ACT inhaler Commonly known as: VENTOLIN  HFA Inhale 2 puffs into the lungs every 6 (six) hours as needed for wheezing or shortness of breath.   losartan  50 MG tablet Commonly known as: COZAAR  Take 50 mg by mouth daily.   metoprolol  tartrate 50 MG tablet Commonly known as: LOPRESSOR  Take 50 mg by mouth 2 (two) times daily.   pantoprazole  40 MG tablet Commonly known as: PROTONIX  Take 1 tablet (40 mg total) by mouth daily.   promethazine  25 MG tablet Commonly known as: PHENERGAN  Take 25 mg by mouth every 6 (six) hours as needed for nausea or vomiting.   rosuvastatin  40 MG tablet Commonly known as: CRESTOR  Take 40 mg by mouth daily.   SLEEP-AID PO Take 1 tablet by mouth daily as needed.   Trelegy Ellipta  200-62.5-25 MCG/ACT Aepb Generic drug: Fluticasone -Umeclidin-Vilant Inhale 1 puff into the lungs daily.       No Known Allergies    The results of significant diagnostics from this hospitalization (including imaging, microbiology, ancillary and laboratory) are listed below for reference.    Significant Diagnostic Studies: CT ABDOMEN PELVIS WO CONTRAST Result Date: 03/10/2024 CLINICAL DATA:  Acute, non localized abdominal pain with nausea and vomiting. EXAM: CT ABDOMEN AND PELVIS WITHOUT CONTRAST TECHNIQUE: Multidetector CT imaging of the abdomen and pelvis was performed following the standard protocol without IV contrast. RADIATION DOSE REDUCTION: This exam was performed according to the departmental  dose-optimization program which includes automated exposure control, adjustment of the mA and/or kV according to patient size and/or use of iterative reconstruction technique. COMPARISON:  04/20/2023 FINDINGS: Lower chest: Stable calcified pleural plaques at the right lung base. Hepatobiliary: Stable liver cysts. These do not need imaging follow-up. Cholecystectomy clips. Pancreas: Unremarkable. No pancreatic ductal dilatation or surrounding inflammatory changes. Spleen: Normal in size without focal abnormality. Adrenals/Urinary Tract: Stable post nephrectomy changes on the right. Stable surgically absent right adrenal gland. No evidence of tumor recurrence. Stable mild left adrenal hyperplasia with no mass. Unremarkable left kidney, ureter and urinary bladder. Stomach/Bowel: Mild colonic diverticulosis without evidence of diverticulitis. Surgically absent appendix. Unremarkable stomach and small bowel. Vascular/Lymphatic: Stable aorto bi-iliac stents within an infrarenal abdominal aortic aneurysm measuring 4.4 cm in maximum diameter, previously 4.5 cm. No enlarged lymph nodes. Reproductive: Uterus and bilateral adnexa are unremarkable. Other: No abdominal wall hernia or abnormality. No abdominopelvic ascites. Musculoskeletal: Stable lumbar and lower thoracic spine degenerative changes. Stable old L3 compression fracture. No pars defects, acute fractures or subluxations. IMPRESSION: 1. No acute abnormality. 2. Stable post nephrectomy changes on the right with no evidence of tumor recurrence. 3. Stable aorto bi-iliac stents within an infrarenal abdominal aortic aneurysm measuring 4.4 cm in maximum diameter, previously  4.5 cm. 4. Mild colonic diverticulosis without evidence of diverticulitis. 5. Stable calcified pleural plaques at the right lung base. These could be due to previous trauma or previous asbestos exposure. Electronically Signed   By: Catherin Closs M.D.   On: 03/10/2024 15:49    Microbiology: Recent  Results (from the past 240 hours)  Respiratory (~20 pathogens) panel by PCR     Status: None   Collection Time: 03/10/24  7:02 PM   Specimen: Nasopharyngeal Swab; Respiratory  Result Value Ref Range Status   Adenovirus NOT DETECTED NOT DETECTED Final   Coronavirus 229E NOT DETECTED NOT DETECTED Final    Comment: (NOTE) The Coronavirus on the Respiratory Panel, DOES NOT test for the novel  Coronavirus (2019 nCoV)    Coronavirus HKU1 NOT DETECTED NOT DETECTED Final   Coronavirus NL63 NOT DETECTED NOT DETECTED Final   Coronavirus OC43 NOT DETECTED NOT DETECTED Final   Metapneumovirus NOT DETECTED NOT DETECTED Final   Rhinovirus / Enterovirus NOT DETECTED NOT DETECTED Final   Influenza A NOT DETECTED NOT DETECTED Final   Influenza B NOT DETECTED NOT DETECTED Final   Parainfluenza Virus 1 NOT DETECTED NOT DETECTED Final   Parainfluenza Virus 2 NOT DETECTED NOT DETECTED Final   Parainfluenza Virus 3 NOT DETECTED NOT DETECTED Final   Parainfluenza Virus 4 NOT DETECTED NOT DETECTED Final   Respiratory Syncytial Virus NOT DETECTED NOT DETECTED Final   Bordetella pertussis NOT DETECTED NOT DETECTED Final   Bordetella Parapertussis NOT DETECTED NOT DETECTED Final   Chlamydophila pneumoniae NOT DETECTED NOT DETECTED Final   Mycoplasma pneumoniae NOT DETECTED NOT DETECTED Final    Comment: Performed at [facility], [address] [place], [place] [address]     Labs: Basic Metabolic Panel: Recent Labs  Lab 03/10/24 1110 03/10/24 2329 03/11/24 0931 03/12/24 0914  NA 135 134* 136 142  K 4.2 3.3* 3.4* 3.2*  CL 95* 102 105 108  CO2 23 24 25 26   GLUCOSE 134* 100* 88 104*  BUN 34* 32* 28* 23  CREATININE 2.04* 1.61* 1.58* 1.70*  CALCIUM  10.6* 8.9 8.5* 8.9   Liver Function Tests: Recent Labs  Lab 03/10/24 1110 03/10/24 2329 03/11/24 0931 03/12/24 0914  AST 44* 220* 100* 42*  ALT 34 135* 111* 69*  ALKPHOS 92 72 67 60  BILITOT 1.0 1.5* 0.7 0.4  PROT 8.9* 5.9* 5.6* 5.6*   ALBUMIN  4.4 2.9* 2.9* 2.9*   Recent Labs  Lab 03/10/24 1110 03/11/24 0931  LIPASE 37 83*   No results for input(s): "AMMONIA" in the last 168 hours. CBC: Recent Labs  Lab 03/10/24 1110 03/10/24 2329 03/12/24 0914  WBC 11.0* 9.4 5.8  HGB 17.5* 13.5 13.0  HCT 52.8* 40.0 38.9  MCV 87.9 87.1 89.2  PLT 378 219 201   Cardiac Enzymes: No results for input(s): "CKTOTAL", "CKMB", "CKMBINDEX", "TROPONINI" in the last 168 hours. BNP: BNP (last 3 results) No results for input(s): "BNP" in the last 8760 hours.  ProBNP (last 3 results) No results for input(s): "PROBNP" in the last 8760 hours.  CBG: No results for input(s): "GLUCAP" in the last 168 hours.  Signed:  Verlie Glisson MD   [facility] 03/12/2024, 11:04 AM

## 2024-03-12 NOTE — ED Provider Notes (Signed)
 [facility]EMERGENCY DEPT [facility] Emergency Department Provider Note MRN:  [id number]  Arrival date & time: 03/13/24     Chief Complaint   Urinary Frequency and Abdominal Pain   History of Present Illness   Katie [name] is a 78 y.o. year-old female presents to the ED with chief complaint of nausea and vomiting.  Reports associated urinary frequency.  Reports associated dizziness, headache, subjective fevers and chills.  States that she was just released from the hospital earlier today after having been admitted for AKI.  There was some concern for pancreatitis, but this was thought to be ruled out.  She states that today her blood pressure has been running high as well.  History provided by patient.   Review of Systems  Pertinent positive and negative review of systems noted in HPI.    Physical Exam   Vitals:   03/13/24 0100 03/13/24 0125  BP: (!) 203/104 (!) 188/97  Pulse: 76 81  Resp: 16 18  Temp:    SpO2: 99% 97%    CONSTITUTIONAL:  non toxic-appearing, NAD NEURO:  Alert and oriented x 3, CN 3-12 grossly intact EYES:  eyes equal and reactive ENT/NECK:  Supple, no stridor  CARDIO:  normal rate, regular rhythm, appears well-perfused  PULM:  No respiratory distress, CTAB GI/GU:  non-distended, generalized abdominal discomfort MSK/SPINE:  No gross deformities, no edema, moves all extremities  SKIN:  no rash, atraumatic   *Additional and/or pertinent findings included in MDM below  Diagnostic and Interventional Summary    EKG Interpretation Date/Time:    Ventricular Rate:    PR Interval:    QRS Duration:    QT Interval:    QTC Calculation:   R Axis:      Text Interpretation:         Labs Reviewed  COMPREHENSIVE METABOLIC PANEL WITH GFR - Abnormal; Notable for the following components:      Result Value   Glucose, Bld 138 (*)    Creatinine, Ser 1.48 (*)    ALT 70 (*)    GFR, Estimated 36 (*)    All other components within normal limits   URINALYSIS, ROUTINE W REFLEX MICROSCOPIC - Abnormal; Notable for the following components:   Color, Urine COLORLESS (*)    Glucose, UA 50 (*)    Hgb urine dipstick SMALL (*)    All other components within normal limits  LIPASE, BLOOD  CBC    CT ABDOMEN PELVIS WO CONTRAST  Final Result      Medications  lactated ringers  bolus 1,000 mL (0 mLs Intravenous Stopped 03/13/24 0123)  famotidine  (PEPCID ) IVPB 20 mg premix (0 mg Intravenous Stopped 03/12/24 2359)  labetalol  (NORMODYNE ) injection 20 mg (20 mg Intravenous Given 03/12/24 2324)  alum & mag hydroxide-simeth (MAALOX/MYLANTA) 200-200-20 MG/5ML suspension 30 mL (30 mLs Oral Given 03/12/24 2329)     Procedures  /  Critical Care Procedures  ED Course and Medical Decision Making  I have reviewed the triage vital signs, the nursing notes, and pertinent available records from the EMR.  Social Determinants Affecting Complexity of Care: Patient has no clinically significant social determinants affecting this chief complaint..   ED Course: Clinical Course as of 03/13/24 0500  Sun Mar 13, 2024  0500 Comprehensive metabolic panel(!) Improved creatinine [RB]  0500 CBC No leukocytosis or anemia. [RB]    Clinical Course User Index [RB] Sherel Dikes, PA-C    Medical Decision Making Patient returns to the ER tonight for nausea and vomiting and urinary frequency.  Was recently seen and admitted for AKI.  Was just discharged.  States that she had been feeling well, but the vomiting returned and she thought she had a UTI due to urinary frequency.  Labs and imaging are reassuring.  BP was elevated, but patient hasn't had her BP meds today.  Give a dose of labetalol  with improvement.  Patient reassessed and states that she is feeling better.  I'll try adding some pepcid  and carafate  in case she has some GERD or PUD upsetting her stomach.    She looks well.  I don't think that she needs to be readmitted.  Amount and/or Complexity of  Data Reviewed Labs: ordered. Radiology: ordered.  Risk OTC drugs. Prescription drug management.         Consultants: No consultations were needed in caring for this patient.   Treatment and Plan: I considered admission due to patient's initial presentation, but after considering the examination and diagnostic results, patient will not require admission and can be discharged with outpatient follow-up.    Final Clinical Impressions(s) / ED Diagnoses     ICD-10-CM   1. Nausea and vomiting, unspecified vomiting type  R11.2     2. Urinary frequency  R35.0       ED Discharge Orders          Ordered    sucralfate  (CARAFATE ) 1 g tablet  3 times daily with meals & bedtime        03/13/24 0455    famotidine  (PEPCID ) 20 MG tablet  2 times daily        03/13/24 0455              Discharge Instructions Discussed with and Provided to Patient:   Discharge Instructions   None      Sherel Dikes, PA-C 03/13/24 0500    Palumbo, April, MD 03/13/24 Margarete Sharps

## 2024-03-13 ENCOUNTER — Emergency Department (HOSPITAL_COMMUNITY): Payer: Medicare (Managed Care)

## 2024-03-13 MED ORDER — SUCRALFATE 1 G PO TABS: 1.0000 g | ORAL_TABLET | Freq: Three times a day (TID) | ORAL | 0 refills | Status: DC

## 2024-03-13 MED ORDER — FAMOTIDINE 20 MG PO TABS: 20.0000 mg | ORAL_TABLET | Freq: Two times a day (BID) | ORAL | 0 refills | Status: DC

## 2024-05-04 ENCOUNTER — Emergency Department (HOSPITAL_COMMUNITY): Payer: Medicare (Managed Care)

## 2024-05-04 ENCOUNTER — Encounter (HOSPITAL_COMMUNITY): Payer: Self-pay | Admitting: Emergency Medicine

## 2024-05-04 ENCOUNTER — Emergency Department (HOSPITAL_COMMUNITY)
Admission: EM | Admit: 2024-05-04 | Discharge: 2024-05-05 | Disposition: A | Discharge status: Home / Self Care | Payer: Medicare (Managed Care) | Attending: Emergency Medicine | Admitting: Emergency Medicine

## 2024-05-04 ENCOUNTER — Other Ambulatory Visit: Payer: Self-pay

## 2024-05-04 DIAGNOSIS — R112 Nausea with vomiting, unspecified: Secondary | ICD-10-CM | POA: Insufficient documentation

## 2024-05-04 DIAGNOSIS — R1033 Periumbilical pain: Secondary | ICD-10-CM | POA: Insufficient documentation

## 2024-05-04 DIAGNOSIS — R7989 Other specified abnormal findings of blood chemistry: Secondary | ICD-10-CM | POA: Diagnosis not present

## 2024-05-04 DIAGNOSIS — J449 Chronic obstructive pulmonary disease, unspecified: Secondary | ICD-10-CM | POA: Diagnosis not present

## 2024-05-04 DIAGNOSIS — Z79899 Other long term (current) drug therapy: Secondary | ICD-10-CM | POA: Diagnosis not present

## 2024-05-04 DIAGNOSIS — Z7951 Long term (current) use of inhaled steroids: Secondary | ICD-10-CM | POA: Insufficient documentation

## 2024-05-04 DIAGNOSIS — N390 Urinary tract infection, site not specified: Secondary | ICD-10-CM

## 2024-05-04 DIAGNOSIS — R300 Dysuria: Secondary | ICD-10-CM | POA: Insufficient documentation

## 2024-05-04 DIAGNOSIS — I10 Essential (primary) hypertension: Secondary | ICD-10-CM | POA: Insufficient documentation

## 2024-05-04 LAB — URINALYSIS, ROUTINE W REFLEX MICROSCOPIC
Bacteria, UA: NONE SEEN
Bilirubin Urine: NEGATIVE
Glucose, UA: NEGATIVE mg/dL
Hgb urine dipstick: NEGATIVE
Ketones, ur: NEGATIVE mg/dL
Nitrite: NEGATIVE
Protein, ur: 30 mg/dL — AB
Specific Gravity, Urine: 1.021 (ref 1.005–1.030)
pH: 6 (ref 5.0–8.0)

## 2024-05-04 LAB — CBC
HCT: 47.9 % — ABNORMAL HIGH (ref 36.0–46.0)
Hemoglobin: 15.8 g/dL — ABNORMAL HIGH (ref 12.0–15.0)
MCH: 29.5 pg (ref 26.0–34.0)
MCHC: 33 g/dL (ref 30.0–36.0)
MCV: 89.4 fL (ref 80.0–100.0)
Platelets: 378 K/uL (ref 150–400)
RBC: 5.36 MIL/uL — ABNORMAL HIGH (ref 3.87–5.11)
RDW: 12.5 % (ref 11.5–15.5)
WBC: 11.1 K/uL — ABNORMAL HIGH (ref 4.0–10.5)
nRBC: 0 % (ref 0.0–0.2)

## 2024-05-04 LAB — COMPREHENSIVE METABOLIC PANEL WITH GFR
ALT: 27 U/L (ref 0–44)
AST: 25 U/L (ref 15–41)
Albumin: 4.3 g/dL (ref 3.5–5.0)
Alkaline Phosphatase: 83 U/L (ref 38–126)
Anion gap: 14 (ref 5–15)
BUN: 37 mg/dL — ABNORMAL HIGH (ref 8–23)
CO2: 25 mmol/L (ref 22–32)
Calcium: 10.7 mg/dL — ABNORMAL HIGH (ref 8.9–10.3)
Chloride: 96 mmol/L — ABNORMAL LOW (ref 98–111)
Creatinine, Ser: 1.85 mg/dL — ABNORMAL HIGH (ref 0.44–1.00)
GFR, Estimated: 28 mL/min — ABNORMAL LOW (ref >60)
Glucose, Bld: 122 mg/dL — ABNORMAL HIGH (ref 70–99)
Potassium: 3.9 mmol/L (ref 3.5–5.1)
Sodium: 135 mmol/L (ref 135–145)
Total Bilirubin: 1 mg/dL (ref 0.0–1.2)
Total Protein: 8.1 g/dL (ref 6.5–8.1)

## 2024-05-04 LAB — TROPONIN I (HIGH SENSITIVITY): Troponin I (High Sensitivity): 16 ng/L (ref <18)

## 2024-05-04 LAB — LIPASE, BLOOD: Lipase: 47 U/L (ref 11–51)

## 2024-05-04 MED ORDER — LORAZEPAM 2 MG/ML IJ SOLN
0.5000 mg | Freq: Once | INTRAMUSCULAR | Status: AC
  Administered 2024-05-04: 0.5 mg via INTRAVENOUS
  Filled 2024-05-04: qty 1

## 2024-05-04 MED ORDER — CEPHALEXIN 500 MG PO CAPS: 500.0000 mg | ORAL_CAPSULE | Freq: Four times a day (QID) | ORAL | 0 refills | Status: DC

## 2024-05-04 MED ORDER — PANTOPRAZOLE SODIUM 40 MG IV SOLR
40.0000 mg | Freq: Once | INTRAVENOUS | Status: AC
  Administered 2024-05-04: 40 mg via INTRAVENOUS
  Filled 2024-05-04: qty 10

## 2024-05-04 MED ORDER — LABETALOL HCL 5 MG/ML IV SOLN
10.0000 mg | Freq: Once | INTRAVENOUS | Status: AC
  Administered 2024-05-04: 10 mg via INTRAVENOUS
  Filled 2024-05-04: qty 4

## 2024-05-04 MED ORDER — ALUM & MAG HYDROXIDE-SIMETH 200-200-20 MG/5ML PO SUSP
30.0000 mL | Freq: Once | ORAL | Status: AC
  Administered 2024-05-04: 30 mL via ORAL
  Filled 2024-05-04: qty 30

## 2024-05-04 MED ORDER — SODIUM CHLORIDE 0.9 % IV BOLUS
500.0000 mL | Freq: Once | INTRAVENOUS | Status: AC
  Administered 2024-05-04: 500 mL via INTRAVENOUS

## 2024-05-04 MED ORDER — TRIMETHOBENZAMIDE HCL 100 MG/ML IM SOLN: 200.0000 mg | Freq: Four times a day (QID) | INTRAMUSCULAR | Status: DC | PRN

## 2024-05-04 MED ORDER — TRIMETHOBENZAMIDE HCL 100 MG/ML IM SOLN
200.0000 mg | Freq: Three times a day (TID) | INTRAMUSCULAR | Status: DC | PRN
  Administered 2024-05-04: 200 mg via INTRAMUSCULAR
  Filled 2024-05-04 (×2): qty 2

## 2024-05-04 NOTE — Discharge Instructions (Addendum)
 You were diagnosed this evening with a urinary tract infection.  I have sent a prescription for an antibiotic to your pharmacy.  Please follow-up with your primary care provider.  If you develop any life-threatening symptoms return to the emergency department.

## 2024-05-04 NOTE — ED Provider Notes (Signed)
 Katie [name]: [id number] Arrival date & time: 05/04/24  1616     Patient presents with: Dysuria and Abdominal Pain   Katie [name] is a 78 y.o. female.  With past medical history of COPD, hypertension, hyperlipidemia presents to emergency room with complaint of 2 days of nausea and vomiting as well as epigastric pain followed by dysuria for the past day.  She reports due to her nausea and vomiting she has had poor oral intake and has not been able to take her medications as prescribed.  Does note that she had 1 episode of incontinence earlier today.  She denies chest pain shortness of breath or cough.  She has not had fever.    Dysuria Associated symptoms: abdominal pain   Abdominal Pain Associated symptoms: dysuria        Prior to Admission medications   Medication Sig Start Date End Date Taking? Authorizing Provider  albuterol  (VENTOLIN  HFA) 108 (90 Base) MCG/ACT inhaler Inhale 2 puffs into the lungs every 6 (six) hours as needed for wheezing or shortness of breath. 02/08/20   Gladis Elsie BROCKS, PA-C  Doxylamine  Succinate, Sleep, (SLEEP-AID PO) Take 1 tablet by mouth daily as needed.    [provider]  famotidine  (PEPCID ) 20 MG tablet Take 1 tablet (20 mg total) by mouth 2 (two) times daily. 03/13/24   Vicky Charleston, PA-C  Fluticasone -Umeclidin-Vilant (TRELEGY ELLIPTA ) 200-62.5-25 MCG/ACT AEPB Inhale 1 puff into the lungs daily. 03/05/23   Hunsucker, Donnice SAUNDERS, MD  losartan  (COZAAR ) 50 MG tablet Take 50 mg by mouth daily.    [provider]  metoprolol  tartrate (LOPRESSOR ) 50 MG tablet Take 50 mg by mouth 2 (two) times daily. 03/02/23   [provider]  pantoprazole  (PROTONIX ) 40 MG tablet Take 1 tablet (40 mg total) by mouth daily. 08/18/22   Zehr, Jessica D, PA-C  rosuvastatin  (CRESTOR ) 40 MG tablet Take 40 mg by mouth daily. 06/11/22   [provider]  sucralfate  (CARAFATE ) 1 g  tablet Take 1 tablet (1 g total) by mouth 4 (four) times daily -  with meals and at bedtime. 03/13/24   Vicky Charleston, PA-C    Allergies: Patient has no known allergies.    Review of Systems  Gastrointestinal:  Positive for abdominal pain.  Genitourinary:  Positive for dysuria.    Updated Vital Signs BP (!) 210/121   Pulse 93   Temp 97.6 F (36.4 C) (Oral)   Resp (!) 21   Ht 5' 1 (1.549 m)   Wt 63.5 kg   SpO2 98%   BMI 26.45 kg/m   Physical Exam Vitals and nursing note reviewed.  Constitutional:      General: She is not in acute distress.    Appearance: She is not toxic-appearing.  HENT:     Head: Normocephalic and atraumatic.  Eyes:     General: No scleral icterus.    Conjunctiva/sclera: Conjunctivae normal.  Cardiovascular:     Rate and Rhythm: Normal rate and regular rhythm.     Pulses: Normal pulses.     Heart sounds: Normal heart sounds.  Pulmonary:     Effort: Pulmonary effort is normal. No respiratory distress.     Breath sounds: Normal breath sounds.  Abdominal:     General: Abdomen is flat. Bowel sounds are normal. There is no distension.     Palpations: Abdomen is soft. There is no mass.     Tenderness: There is abdominal  tenderness.     Comments: Periumbilical tenderness to palpation.  Skin:    General: Skin is warm and dry.     Findings: No lesion.  Neurological:     General: No focal deficit present.     Mental Status: She is alert and oriented to person, place, and time. Mental status is at baseline.     (all labs ordered are listed, but only abnormal results are displayed) Labs Reviewed  COMPREHENSIVE METABOLIC PANEL WITH GFR - Abnormal; Notable for the following components:      Result Value   Chloride 96 (*)    Glucose, Bld 122 (*)    BUN 37 (*)    Creatinine, Ser 1.85 (*)    Calcium  10.7 (*)    GFR, Estimated 28 (*)    All other components within normal limits  CBC - Abnormal; Notable for the following components:   WBC 11.1 (*)     RBC 5.36 (*)    Hemoglobin 15.8 (*)    HCT 47.9 (*)    All other components within normal limits  LIPASE, BLOOD  URINALYSIS, ROUTINE W REFLEX MICROSCOPIC  TROPONIN I (HIGH SENSITIVITY)    EKG: EKG Interpretation Date/Time:  Wednesday May 04 2024 21:16:35 EDT Ventricular Rate:  92 PR Interval:  122 QRS Duration:  92 QT Interval:  411 QTC Calculation: 509 R Axis:   -22  Text Interpretation: Sinus rhythm LAE, consider biatrial enlargement Probable left ventricular hypertrophy Prolonged QT interval Confirmed by Ruthe Cornet [phone]) on 05/04/2024 9:18:51 PM  Radiology: CT ABDOMEN PELVIS WO CONTRAST Result Date: 05/04/2024 CLINICAL DATA:  History of acute kidney injury abdominal pain with increased frequency EXAM: CT ABDOMEN AND PELVIS WITHOUT CONTRAST TECHNIQUE: Multidetector CT imaging of the abdomen and pelvis was performed following the standard protocol without IV contrast. RADIATION DOSE REDUCTION: This exam was performed according to the departmental dose-optimization program which includes automated exposure control, adjustment of the mA and/or kV according to patient size and/or use of iterative reconstruction technique. COMPARISON:  CT 03/13/2024, 03/10/2024, 04/20/2023 FINDINGS: Lower chest: Lung bases demonstrate no acute airspace disease. Chronic calcified nodule along the pleural surface of right diaphragm. Chronic cystic density at the right cardio phrenic sulcus. Hepatobiliary: Multiple hepatic cysts and subcentimeter hypodensities as before, no specific imaging follow-up recommended. Cholecystectomy. No biliary dilatation Pancreas: Unremarkable. No pancreatic ductal dilatation or surrounding inflammatory changes. Spleen: Normal in size without focal abnormality. Adrenals/Urinary Tract: Thickened left adrenal without dominant mass. Right adrenal gland not well seen and may be surgically absent. Right nephrectomy. Left kidney shows no hydronephrosis. The bladder is decompressed  and unremarkable Stomach/Bowel: The stomach is nonenlarged. No dilated small bowel. No acute bowel wall thickening. Mild diverticular disease of left colon Vascular/Lymphatic: Status post aortoiliac stent. Excluded infrarenal abdominal aortic aneurysm measuring up to 4.4 cm maximum, previously 4.5 cm. No suspicious lymph nodes Reproductive: Uterus and bilateral adnexa are unremarkable. Other: Negative for pelvic effusion or free air Musculoskeletal: No acute or suspicious osseous abnormality. Chronic compression fracture at L3 IMPRESSION: 1. No CT evidence for acute intra-abdominal or pelvic abnormality. 2. Status post right nephrectomy. 3. Status post aortoiliac stent. Stable excluded infrarenal abdominal aortic aneurysm measuring up to 4.4 cm. 4. Mild diverticular disease of left colon without acute inflammatory process. Electronically Signed   By: Luke Bun M.D.   On: 05/04/2024 20:51     Procedures   Medications Ordered in the ED  alum & mag hydroxide-simeth (MAALOX/MYLANTA) 200-200-20 MG/5ML suspension 30 mL (has no  administration in time range)  pantoprazole  (PROTONIX ) injection 40 mg (has no administration in time range)  labetalol  (NORMODYNE ) injection 10 mg (has no administration in time range)  sodium chloride  0.9 % bolus 500 mL (500 mLs Intravenous New Bag/Given 05/04/24 2058)                                    Medical Decision Making Amount and/or Complexity of Data Reviewed Labs: ordered. Radiology: ordered.  Risk OTC drugs. Prescription drug management.   This patient presents to the ED for concern of abdominal pain, this involves an extensive number of treatment options, and is a complaint that carries with it a high risk of complications and morbidity.  The differential diagnosis includes atypical presentation of ACS, gastritis, gastroenteritis, UTI, pyelonephritis   Co morbidities that complicate the patient evaluation  Hypertension, hyperlipidemia   Additional  history obtained:  Additional history obtained from 03/12/2024 when patient was seen here for similar symptoms   Lab Tests:  I personally interpreted labs.  The pertinent results include:   CBC with no leukocytosis.  Hemoglobin is 15.8. CMP with creatinine of 1.8 elevated slightly from prior recent labs.  Trop pending UA pending    Imaging Studies ordered:  I ordered imaging studies including CT of abdomen and pelvis I independently visualized and interpreted imaging which showed  I agree with the radiologist interpretation   Cardiac Monitoring: / EKG:  The patient was maintained on a cardiac monitor.     Problem List / ED Course / Critical interventions / Medication management  Patient presents to urgency room with complaint of nausea and vomiting as well as dysuria.  Due to QT prolongation she cannot take typical antiemetics and thus given Ativan  and IM Tigan .  She was given GI cocktail and Protonix  as well for better pain control.  Labs are overall reassuring without significant leukocytosis.  Kidney function appears at baseline.  CT scan without acute abnormality.  Given patient's hypertension checking 1 troponin to rule out demand ischemia.  Also waiting UA to rule out urinary tract infection.   I ordered medication including labetalol  for hypertension.  Patient is not taking any hypertensive medicines today and does not think she can tolerate oral medicine thus given IV.  She had improvement in her hypotension. Reevaluation of the patient after these medicines showed that the patient improved I have reviewed the patients home medicines and have made adjustments as needed Sign off to [employer] at shift change. Pending reassessment to make sure tolerating oral intake. Waiting for Trop and UA.         Final diagnoses:  None    ED Discharge Orders     None          Shermon Warren SAILOR, PA-C 05/04/24 2215    Ruthe Cornet, DO 05/04/24 2253

## 2024-05-04 NOTE — ED Triage Notes (Incomplete)
 Patient comes in for decreased urine output.  Hx of 1 kidney. No medications taken today.  Abdominal pain today.  Nausea today  Belching

## 2024-05-04 NOTE — ED Notes (Signed)
 Pt came up my window limping stating she is feeling very sick I notified the day triage nurse , nurse said I should take the pt vitals and I did

## 2024-05-04 NOTE — ED Notes (Signed)
 Patient BP is high RN Notified

## 2024-05-04 NOTE — ED Provider Notes (Signed)
  Physical Exam  BP (!) 160/108   Pulse 63   Temp 97.6 F (36.4 C) (Oral)   Resp (!) 21   Ht 5' 1 (1.549 m)   Wt 63.5 kg   SpO2 97%   BMI 26.45 kg/m   Physical Exam  Procedures  Procedures  ED Course / MDM    Medical Decision Making Amount and/or Complexity of Data Reviewed Labs: ordered. Radiology: ordered.  Risk OTC drugs. Prescription drug management.   Patient care assumed at shift handoff from [employer] PA-C.  See her note for full details.  In short, 78 year old female with past medical history sniffer COPD, hypertension presents emergency room complaint of 2 days of nausea and vomiting with epigastric pain and dysuria.  At time of shift handoff Workup and been reassuring.  Plan to follow-up on troponin and UA.  Troponin was negative, UA with moderate leukocytes, 21-50 WBCs, concerning for UTI.  Patient is resting comfortably at this time with no further nausea and vomiting.  She is tolerating oral intake.  Patient appears stable for discharge home with prescription for antibiotics for urinary tract infection.  Return precautions discussed with patient.     Logan Ubaldo NOVAK, PA-C 05/04/24 2350    Bari Charmaine FALCON, MD 05/05/24 [phone]

## 2024-05-05 NOTE — ED Notes (Signed)
 Pt care taken, resting waiting for disposition.

## 2024-05-06 LAB — URINE CULTURE

## 2024-06-01 ENCOUNTER — Emergency Department (HOSPITAL_COMMUNITY): Payer: Medicare (Managed Care)

## 2024-06-01 ENCOUNTER — Encounter (HOSPITAL_COMMUNITY): Payer: Self-pay | Admitting: Emergency Medicine

## 2024-06-01 ENCOUNTER — Emergency Department (HOSPITAL_COMMUNITY)
Admission: EM | Admit: 2024-06-01 | Discharge: 2024-06-01 | Disposition: A | Discharge status: Home / Self Care | Payer: Medicare (Managed Care) | Attending: Emergency Medicine | Admitting: Emergency Medicine

## 2024-06-01 ENCOUNTER — Other Ambulatory Visit: Payer: Self-pay

## 2024-06-01 DIAGNOSIS — F17290 Nicotine dependence, other tobacco product, uncomplicated: Secondary | ICD-10-CM | POA: Insufficient documentation

## 2024-06-01 DIAGNOSIS — Z7951 Long term (current) use of inhaled steroids: Secondary | ICD-10-CM | POA: Insufficient documentation

## 2024-06-01 DIAGNOSIS — I509 Heart failure, unspecified: Secondary | ICD-10-CM | POA: Diagnosis not present

## 2024-06-01 DIAGNOSIS — R112 Nausea with vomiting, unspecified: Secondary | ICD-10-CM

## 2024-06-01 DIAGNOSIS — J449 Chronic obstructive pulmonary disease, unspecified: Secondary | ICD-10-CM | POA: Insufficient documentation

## 2024-06-01 DIAGNOSIS — I272 Pulmonary hypertension, unspecified: Secondary | ICD-10-CM

## 2024-06-01 DIAGNOSIS — R1084 Generalized abdominal pain: Secondary | ICD-10-CM

## 2024-06-01 DIAGNOSIS — I130 Hypertensive heart and chronic kidney disease with heart failure and stage 1 through stage 4 chronic kidney disease, or unspecified chronic kidney disease: Secondary | ICD-10-CM | POA: Insufficient documentation

## 2024-06-01 DIAGNOSIS — N1831 Chronic kidney disease, stage 3a: Secondary | ICD-10-CM | POA: Diagnosis not present

## 2024-06-01 DIAGNOSIS — Z79899 Other long term (current) drug therapy: Secondary | ICD-10-CM | POA: Insufficient documentation

## 2024-06-01 DIAGNOSIS — E876 Hypokalemia: Secondary | ICD-10-CM

## 2024-06-01 DIAGNOSIS — R1033 Periumbilical pain: Secondary | ICD-10-CM | POA: Diagnosis not present

## 2024-06-01 DIAGNOSIS — R062 Wheezing: Secondary | ICD-10-CM | POA: Diagnosis not present

## 2024-06-01 DIAGNOSIS — R1013 Epigastric pain: Secondary | ICD-10-CM | POA: Diagnosis not present

## 2024-06-01 DIAGNOSIS — R109 Unspecified abdominal pain: Secondary | ICD-10-CM | POA: Diagnosis present

## 2024-06-01 LAB — CBC WITH DIFFERENTIAL/PLATELET
Abs Immature Granulocytes: 0.04 K/uL (ref 0.00–0.07)
Basophils Absolute: 0 K/uL (ref 0.0–0.1)
Basophils Relative: 0 %
Eosinophils Absolute: 0 K/uL (ref 0.0–0.5)
Eosinophils Relative: 0 %
HCT: 48.6 % — ABNORMAL HIGH (ref 36.0–46.0)
Hemoglobin: 16.7 g/dL — ABNORMAL HIGH (ref 12.0–15.0)
Immature Granulocytes: 0 %
Lymphocytes Relative: 22 %
Lymphs Abs: 2.1 K/uL (ref 0.7–4.0)
MCH: 29.7 pg (ref 26.0–34.0)
MCHC: 34.4 g/dL (ref 30.0–36.0)
MCV: 86.5 fL (ref 80.0–100.0)
Monocytes Absolute: 0.6 K/uL (ref 0.1–1.0)
Monocytes Relative: 6 %
Neutro Abs: 6.6 K/uL (ref 1.7–7.7)
Neutrophils Relative %: 72 %
Platelets: 360 K/uL (ref 150–400)
RBC: 5.62 MIL/uL — ABNORMAL HIGH (ref 3.87–5.11)
RDW: 12.3 % (ref 11.5–15.5)
WBC: 9.4 K/uL (ref 4.0–10.5)
nRBC: 0 % (ref 0.0–0.2)

## 2024-06-01 LAB — URINALYSIS, W/ REFLEX TO CULTURE (INFECTION SUSPECTED)
Bacteria, UA: NONE SEEN
Bilirubin Urine: NEGATIVE
Glucose, UA: NEGATIVE mg/dL
Hgb urine dipstick: NEGATIVE
Ketones, ur: NEGATIVE mg/dL
Leukocytes,Ua: NEGATIVE
Nitrite: NEGATIVE
Protein, ur: NEGATIVE mg/dL
Specific Gravity, Urine: 1.044 — ABNORMAL HIGH (ref 1.005–1.030)
pH: 7 (ref 5.0–8.0)

## 2024-06-01 LAB — COMPREHENSIVE METABOLIC PANEL WITH GFR
ALT: 42 U/L (ref 0–44)
AST: 31 U/L (ref 15–41)
Albumin: 3.8 g/dL (ref 3.5–5.0)
Alkaline Phosphatase: 82 U/L (ref 38–126)
Anion gap: 15 (ref 5–15)
BUN: 37 mg/dL — ABNORMAL HIGH (ref 8–23)
CO2: 27 mmol/L (ref 22–32)
Calcium: 10.2 mg/dL (ref 8.9–10.3)
Chloride: 91 mmol/L — ABNORMAL LOW (ref 98–111)
Creatinine, Ser: 1.87 mg/dL — ABNORMAL HIGH (ref 0.44–1.00)
GFR, Estimated: 28 mL/min — ABNORMAL LOW (ref >60)
Glucose, Bld: 134 mg/dL — ABNORMAL HIGH (ref 70–99)
Potassium: 3.1 mmol/L — ABNORMAL LOW (ref 3.5–5.1)
Sodium: 133 mmol/L — ABNORMAL LOW (ref 135–145)
Total Bilirubin: 0.9 mg/dL (ref 0.0–1.2)
Total Protein: 7.4 g/dL (ref 6.5–8.1)

## 2024-06-01 LAB — PROTIME-INR
INR: 1 (ref 0.8–1.2)
Prothrombin Time: 13.4 s (ref 11.4–15.2)

## 2024-06-01 LAB — I-STAT CG4 LACTIC ACID, ED
Lactic Acid, Venous: 2.1 mmol/L (ref 0.5–1.9)
Lactic Acid, Venous: 1.4 mmol/L (ref 0.5–1.9)

## 2024-06-01 LAB — TROPONIN I (HIGH SENSITIVITY): Troponin I (High Sensitivity): 14 ng/L (ref <18)

## 2024-06-01 MED ORDER — SODIUM CHLORIDE 0.9 % IV BOLUS
500.0000 mL | Freq: Once | INTRAVENOUS | Status: AC
  Administered 2024-06-01 (×2): 500 mL via INTRAVENOUS

## 2024-06-01 MED ORDER — IOHEXOL 350 MG/ML SOLN
75.0000 mL | Freq: Once | INTRAVENOUS | Status: AC | PRN
  Administered 2024-06-01 (×2): 75 mL via INTRAVENOUS

## 2024-06-01 MED ORDER — POTASSIUM CHLORIDE CRYS ER 20 MEQ PO TBCR
40.0000 meq | EXTENDED_RELEASE_TABLET | Freq: Once | ORAL | Status: AC
  Administered 2024-06-01 (×2): 40 meq via ORAL
  Filled 2024-06-01: qty 2

## 2024-06-01 MED ORDER — MORPHINE SULFATE (PF) 4 MG/ML IV SOLN
4.0000 mg | Freq: Once | INTRAVENOUS | Status: AC
  Administered 2024-06-01 (×2): 4 mg via INTRAVENOUS
  Filled 2024-06-01: qty 1

## 2024-06-01 MED ORDER — METOCLOPRAMIDE HCL 5 MG/ML IJ SOLN
10.0000 mg | Freq: Once | INTRAMUSCULAR | Status: AC
  Administered 2024-06-01 (×2): 10 mg via INTRAVENOUS
  Filled 2024-06-01: qty 2

## 2024-06-01 NOTE — Discharge Instructions (Addendum)
 As we discussed, your workup in the ER today was overall reassuring for acute findings.  Laboratory evaluation and CT imaging did not reveal any emergent cause of your symptoms.  Given that you are feeling better after interventions today, no further evaluation is indicated.  I recommend that you call your primary care provider today to schedule a follow-up appointment at your earliest convenience.  You should have your kidney function rechecked given the high load of contrast you received today.  Your potassium was also a little low, this should be rechecked as well.  Your CT scan did show something called pulmonary hypertension which should be further evaluated by your primary outpatient as well.  Return if development of any new or worsening symptoms.

## 2024-06-01 NOTE — ED Triage Notes (Addendum)
 Pt in POV with 5 days of n/v, abdominal pain and also has back pain now. Pt appears very uncomfortable, hypotensive and tachy. +cough and sob

## 2024-06-01 NOTE — ED Notes (Signed)
 NS bolus infusing at this time.

## 2024-06-01 NOTE — ED Provider Notes (Signed)
 Patient with GFR of 28.  Patient with history of AAA with graft.  She is complaining of severe abdominal pain with radiation directly to the back.  I discussed the risks and benefits of a contrasted study with the patient and the patient has verbally consented to proceed with a contrasted dissection study for evaluation of possible AAA rupture/complication.  A contrasted study based on her complaint seems reasonable at this time.   Katie [name] Katie [name] 06/01/24 9357    Haze Lonni PARAS, MD 06/02/24 [phone]

## 2024-06-01 NOTE — ED Notes (Signed)
 Pt ambulated to bathroom with no assistance.

## 2024-06-01 NOTE — ED Provider Notes (Signed)
 [place] [facility] Provider Note   CSN: [id number] Arrival date & time: 06/01/24  9480     Patient presents with: Abdominal Pain, Back Pain, and Emesis   Katie [name] is a 77 y.o. female.  Patient with past medical history significant CHF, hypertension, COPD, cholecystectomy, stage IIIa CKD, AAA without rupture status post graft presents to the emergency room complaining of 2 separate complaints.  The patient complains of shortness of breath with cough which developed over the past few weeks.  She denies fever.  The patient also endorses abdominal pain with nausea, vomiting.  She denies any bowel movement for the past 2 days and denies any flatus over that same time period.  She claims of tenderness in the midline abdominal region with radiation of pain to the back.  She denies chest pain, urinary symptoms.    Abdominal Pain Associated symptoms: vomiting   Back Pain Associated symptoms: abdominal pain   Emesis Associated symptoms: abdominal pain        Prior to Admission medications   Medication Sig Start Date End Date Taking? Authorizing Provider  albuterol  (VENTOLIN  HFA) 108 (90 Base) MCG/ACT inhaler Inhale 2 puffs into the lungs every 6 (six) hours as needed for wheezing or shortness of breath. 02/08/20   Gladis Elsie BROCKS, PA-C  cephALEXin  (KEFLEX ) 500 MG capsule Take 1 capsule (500 mg total) by mouth 4 (four) times daily. 05/04/24   Logan Ubaldo NOVAK, PA-C  Doxylamine  Succinate, Sleep, (SLEEP-AID PO) Take 1 tablet by mouth daily as needed.    [provider]  famotidine  (PEPCID ) 20 MG tablet Take 1 tablet (20 mg total) by mouth 2 (two) times daily. 03/13/24   Vicky Charleston, PA-C  Fluticasone -Umeclidin-Vilant (TRELEGY ELLIPTA ) 200-62.5-25 MCG/ACT AEPB Inhale 1 puff into the lungs daily. 03/05/23   Hunsucker, Donnice SAUNDERS, MD  losartan  (COZAAR ) 50 MG tablet Take 50 mg by mouth daily.    [provider]  metoprolol  tartrate  (LOPRESSOR ) 50 MG tablet Take 50 mg by mouth 2 (two) times daily. 03/02/23   [provider]  pantoprazole  (PROTONIX ) 40 MG tablet Take 1 tablet (40 mg total) by mouth daily. 08/18/22   Zehr, Jessica D, PA-C  rosuvastatin  (CRESTOR ) 40 MG tablet Take 40 mg by mouth daily. 06/11/22   [provider]  sucralfate  (CARAFATE ) 1 g tablet Take 1 tablet (1 g total) by mouth 4 (four) times daily -  with meals and at bedtime. 03/13/24   Vicky Charleston, PA-C    Allergies: Patient has no known allergies.    Review of Systems  Gastrointestinal:  Positive for abdominal pain and vomiting.  Musculoskeletal:  Positive for back pain.    Updated Vital Signs BP 123/89   Pulse 88   Temp 97.9 F (36.6 C) (Oral)   Resp (!) 22   Wt 63.5 kg   SpO2 99%   BMI 26.45 kg/m   Physical Exam Vitals and nursing note reviewed.  Constitutional:      General: She is not in acute distress.    Appearance: She is well-developed.  HENT:     Head: Normocephalic and atraumatic.  Eyes:     Conjunctiva/sclera: Conjunctivae normal.  Cardiovascular:     Rate and Rhythm: Normal rate and regular rhythm.     Heart sounds: No murmur heard. Pulmonary:     Effort: Pulmonary effort is normal. No respiratory distress.     Breath sounds: Wheezing present.  Abdominal:     Palpations: Abdomen  is soft.     Tenderness: There is abdominal tenderness in the epigastric area and periumbilical area.  Musculoskeletal:        General: No swelling.     Cervical back: Neck supple.  Skin:    General: Skin is warm and dry.     Capillary Refill: Capillary refill takes less than 2 seconds.  Neurological:     Mental Status: She is alert.  Psychiatric:        Mood and Affect: Mood normal.     (all labs ordered are listed, but only abnormal results are displayed) Labs Reviewed  COMPREHENSIVE METABOLIC PANEL WITH GFR - Abnormal; Notable for the following components:      Result Value   Sodium 133 (*)    Potassium 3.1  (*)    Chloride 91 (*)    Glucose, Bld 134 (*)    BUN 37 (*)    Creatinine, Ser 1.87 (*)    GFR, Estimated 28 (*)    All other components within normal limits  CBC WITH DIFFERENTIAL/PLATELET - Abnormal; Notable for the following components:   RBC 5.62 (*)    Hemoglobin 16.7 (*)    HCT 48.6 (*)    All other components within normal limits  I-STAT CG4 LACTIC ACID, ED - Abnormal; Notable for the following components:   Lactic Acid, Venous 2.1 (*)    All other components within normal limits  CULTURE, BLOOD (ROUTINE X 2)  CULTURE, BLOOD (ROUTINE X 2)  PROTIME-INR  URINALYSIS, W/ REFLEX TO CULTURE (INFECTION SUSPECTED)    EKG: None  Radiology: No results found.   Procedures   Medications Ordered in the ED  morphine  (PF) 4 MG/ML injection 4 mg (has no administration in time range)                                    Medical Decision Making Amount and/or Complexity of Data Reviewed Labs: ordered. Radiology: ordered.   This patient presents to the ED for concern of abdominal pain and wheezing, this involves an extensive number of treatment options, and is a complaint that carries with it a high risk of complications and morbidity.  The differential diagnosis includes pneumonia, dissection, SBO, COPD/asthma exacerbation, others   Co morbidities / Chronic conditions that complicate the patient evaluation  Hx AAA repair, 1 kidney due to hx malignancy   Additional history obtained:  Additional history obtained from EMR External records from outside source obtained and reviewed including discharge summary from 03/10/24   Lab Tests:  I Ordered, and personally interpreted labs.  The pertinent results include: Creatinine 1.87 with a GFR of 28, no leukocytosis, lactic acid 2.1   Imaging Studies ordered:  I ordered imaging studies including chest x-ray, CT angio chest abdomen pelvis dissection study I independently visualized and interpreted imaging which showed grossly  unremarkable chest x-ray CT pending   Problem List / ED Course / Critical interventions / Medication management   I ordered medication including morphine  Reevaluation of the patient after these medicines showed that the patient improved   Social Determinants of Health:  Patient is a daily smoker   Test / Admission - Considered:  Unclear cause at this time of patient's abdominal pain.  She describes severe pain in the center abdomen radiating to the back.  With history of AAA repair I feel the patient needs a dissection study.  The patient has a  GFR of 28 but has consented to a contrasted study.  I have explained the risks of contrast to the patient but also explained my concerns about her presentation and she has verbally consented.  She does have some expiratory wheezing but no acute findings on chest x-ray.  Plan to transfer patient care to Katie Smoot, PA-C at shift handoff with disposition pending results of imaging and reassessment.      Final diagnoses:  None    ED Discharge Orders     None          Logan Ubaldo KATHEE DEVONNA 06/01/24 9359    Haze Lonni PARAS, MD 06/02/24 [phone]

## 2024-06-01 NOTE — ED Notes (Signed)
 Pt provided with water and a sandwich bag. Pt sitting up in bed, eating and drinking

## 2024-06-01 NOTE — ED Provider Notes (Signed)
 Care assumed from Ubaldo High, PA-C at shift change. Please see their note for further information.  Physical Exam  BP 112/83   Pulse 96   Temp 97.9 F (36.6 C) (Oral)   Resp 17   Wt 63.5 kg   SpO2 100%   BMI 26.45 kg/m   Physical Exam Vitals and nursing note reviewed.  Constitutional:      General: She is not in acute distress.    Appearance: Normal appearance. She is normal weight. She is not ill-appearing, toxic-appearing or diaphoretic.  HENT:     Head: Normocephalic and atraumatic.  Cardiovascular:     Rate and Rhythm: Normal rate.  Pulmonary:     Effort: Pulmonary effort is normal. No respiratory distress.  Musculoskeletal:        General: Normal range of motion.     Cervical back: Normal range of motion.  Skin:    General: Skin is warm and dry.  Neurological:     General: No focal deficit present.     Mental Status: She is alert.  Psychiatric:        Mood and Affect: Mood normal.        Behavior: Behavior normal.     Procedures  Procedures  ED Course / MDM    Medical Decision Making Amount and/or Complexity of Data Reviewed Labs: ordered. Radiology: ordered.  Risk Prescription drug management.   Briefly: Patient with shortness of breath, abdominal pain, nausea, vomiting, pain radiating to her back. Hx AAA status post repair. Also has 1 kidney after nephrectomy for renal cell carcinoma. Given symptoms, CTA dissection study ordered.  GFR is less than 30 and therefore patient consented for procedure. This is pending at shift change  Patient without leukocytosis, first lactic was 2.1, improved to 1.4 on recheck. Troponin WNL, UA noninfectious.   Patients CTA has resulted and reveals  1. No evidence of acute aortic syndrome or pulmonary embolism. 2. Status post aortobiiliac stent graft repair of infrarenal abdominal aortic aneurysm with decrease aneurysm sac size of 4.2 cm, previously 4.8 cm. No evidence of endoleak. 3. Enlargement of the main pulmonary  artery measuring 3.5 cm, compatible with pulmonary artery hypertension. 4. Emphysema with diffuse bronchial wall thickening . 5. Multiple liver cysts, including a 3.7 cm cyst in the right lobe. 6. Status post right nephrectomy. No left-sided nephrolithiasis or obstructive uropathy. 7. Colonic diverticula without signs of acute diverticulitis.  I have personally reviewed and interpreted this imaging and agree with radiology interpretation.  Upon reassessment after interventions, patient feels substantially improved and ready to go home.  She was able to ambulate to the bathroom without assistance and her oxygen  saturation remained in the high 90s throughout.  She was also able to eat and drink without any residual nausea or vomiting. She wants to go home. Recommend follow-up evaluation of pulmonary hypertension seen on CT. Will need recheck creatinine outpatient as well given contrast.   Evaluation and diagnostic testing in the emergency department does not suggest an emergent condition requiring admission or immediate intervention beyond what has been performed at this time.  Plan for discharge with close PCP follow-up.  Patient is understanding and amenable with plan, educated on red flag symptoms that would prompt immediate return.  Patient discharged in stable condition.      Nora Lauraine DELENA DEVONNA 06/01/24 1019    Garrick Charleston, MD 06/01/24 [phone]

## 2024-06-06 LAB — CULTURE, BLOOD (ROUTINE X 2)
Culture: NO GROWTH
Culture: NO GROWTH
Special Requests: ADEQUATE
Special Requests: ADEQUATE

## 2024-06-14 ENCOUNTER — Ambulatory Visit: Payer: Medicare (Managed Care) | Admitting: Physician Assistant

## 2024-06-14 NOTE — Progress Notes (Deleted)
 06/14/2024 Katie [name] [id number] [birth year]  Referring provider: [name], MD Primary GI doctor: Dr. Charlanne  ASSESSMENT AND PLAN:  GERD  04/02/2022 EGD for nausea vomiting showed LA grade a esophagitis, mild gastritis normal examined duodenum.  Pathology negative H. pylori negative celiac, showed reflux esophagitis. 08/2022 gastric emptying study unremarkable  AAA with aortic iliac stent repair 06/01/2024 CT angio chest abdomen pelvis for abdominal pain and was back hypotensive tacky no PE, status post aortic iliac stent graft repair with decrease in aneurysm size to 4.2 no evidence of endoleak.  Enlarged main pulmonary artery compatible with pulmonary artery hypertension, emphysema, multiple liver cyst 3.7 cyst on right lobe status post right nephrectomy, diverticula without diverticulitis  Kidney disease with hyponatremia, hypokalemia   Patient Care Team: [name], MD as PCP - General (Family Medicine) Raford Riggs, MD as PCP - Cardiology (Cardiology) Comer, Lamar ORN, MD (Internal Medicine) Raford Riggs, MD as Attending Physician (Cardiology) Tobie Gordy POUR, MD as Consulting Physician (Nephrology) Gladis Elsie JAYSON DEVONNA (Physician Assistant) Pandora Cadet, [facility] as Pharmacist (Pharmacist) Pandora Cadet, [facility] as Pharmacist (Pharmacist)  HISTORY OF PRESENT ILLNESS: 78 y.o. female with a past medical history of COPD, renal cell carcinoma status post nephrectomy, cholecystectomy, COPD, GERD listed below presents for evaluation of ***.   Patient last seen in the office 08/18/2022 by Jessica Zier for nausea and vomiting.  *** Discussed the use of AI scribe software for clinical note transcription with the patient, who gave verbal consent to proceed.  History of Present Illness            She  reports that she has been smoking cigarettes. She started smoking about 58 years ago. She has a 26.5 pack-year smoking history. She has been exposed to tobacco  smoke. She has never used smokeless tobacco. She reports that she does not currently use alcohol. She reports current drug use. Drug: Cocaine.  RELEVANT GI HISTORY, IMAGING AND LABS: Results          CBC    Component Value Date/Time   WBC 9.4 06/01/2024 0548   RBC 5.62 (H) 06/01/2024 0548   HGB 16.7 (H) 06/01/2024 0548   HCT 48.6 (H) 06/01/2024 0548   PLT 360 06/01/2024 0548   MCV 86.5 06/01/2024 0548   MCH 29.7 06/01/2024 0548   MCHC 34.4 06/01/2024 0548   RDW 12.3 06/01/2024 0548   LYMPHSABS 2.1 06/01/2024 0548   MONOABS 0.6 06/01/2024 0548   EOSABS 0.0 06/01/2024 0548   BASOSABS 0.0 06/01/2024 0548   Recent Labs    03/10/24 1110 03/10/24 2329 03/12/24 0914 03/12/24 2132 05/04/24 1707 06/01/24 0548  HGB 17.5* 13.5 13.0 14.3 15.8* 16.7*    CMP     Component Value Date/Time   NA 133 (L) 06/01/2024 0548   NA 140 06/07/2021 1438   K 3.1 (L) 06/01/2024 0548   CL 91 (L) 06/01/2024 0548   CO2 27 06/01/2024 0548   GLUCOSE 134 (H) 06/01/2024 0548   BUN 37 (H) 06/01/2024 0548   BUN 21 06/07/2021 1438   CREATININE 1.87 (H) 06/01/2024 0548   CREATININE 1.81 (H) 06/03/2016 1631   CALCIUM  10.2 06/01/2024 0548   PROT 7.4 06/01/2024 0548   ALBUMIN  3.8 06/01/2024 0548   AST 31 06/01/2024 0548   ALT 42 06/01/2024 0548   ALKPHOS 82 06/01/2024 0548   BILITOT 0.9 06/01/2024 0548   GFRNONAA 28 (L) 06/01/2024 0548   GFRAA 37 (L) 04/26/2020 0246  Latest Ref Rng & Units 06/01/2024    5:48 AM 05/04/2024    5:07 PM 03/12/2024    9:32 PM  Hepatic Function  Total Protein 6.5 - 8.1 g/dL 7.4  8.1  7.6   Albumin  3.5 - 5.0 g/dL 3.8  4.3  3.8   AST 15 - 41 U/L 31  25  38   ALT 0 - 44 U/L 42  27  70   Alk Phosphatase 38 - 126 U/L 82  83  77   Total Bilirubin 0.0 - 1.2 mg/dL 0.9  1.0  1.0       Current Medications:    Current Outpatient Medications (Cardiovascular):    losartan  (COZAAR ) 50 MG tablet, Take 50 mg by mouth daily.   metoprolol  tartrate (LOPRESSOR ) 50 MG  tablet, Take 50 mg by mouth 2 (two) times daily.   rosuvastatin  (CRESTOR ) 40 MG tablet, Take 40 mg by mouth daily.  Current Outpatient Medications (Respiratory):    albuterol  (VENTOLIN  HFA) 108 (90 Base) MCG/ACT inhaler, Inhale 2 puffs into the lungs every 6 (six) hours as needed for wheezing or shortness of breath.   Fluticasone -Umeclidin-Vilant (TRELEGY ELLIPTA ) 200-62.5-25 MCG/ACT AEPB, Inhale 1 puff into the lungs daily.    Current Outpatient Medications (Other):    cephALEXin  (KEFLEX ) 500 MG capsule, Take 1 capsule (500 mg total) by mouth 4 (four) times daily.   Doxylamine  Succinate, Sleep, (SLEEP-AID PO), Take 1 tablet by mouth daily as needed.   famotidine  (PEPCID ) 20 MG tablet, Take 1 tablet (20 mg total) by mouth 2 (two) times daily.   pantoprazole  (PROTONIX ) 40 MG tablet, Take 1 tablet (40 mg total) by mouth daily.   sucralfate  (CARAFATE ) 1 g tablet, Take 1 tablet (1 g total) by mouth 4 (four) times daily -  with meals and at bedtime.  Medical History:  Past Medical History:  Diagnosis Date   AAA (abdominal aortic aneurysm) (HCC)    AKI (acute kidney injury) (HCC)    Arthritis    Chest pain 04/17/2016   Cholelithiasis 2011   s/p cholescystectomy    Chronic diastolic congestive heart failure (HCC)    COPD (chronic obstructive pulmonary disease) (HCC)    GERD (gastroesophageal reflux disease)    History of renal cell carcinoma    Hypercholesteremia    Hypertension    Hypertension    Mitral regurgitation    Nausea and vomiting 06/19/2022   Pneumonia 1980's   Renal cell carcinoma 01/2010   s/p right radical nephrectomy 01/2010,  followed by [facility]   S/P minimally invasive mitral valve repair 04/20/2019   Complex valvuloplasty including triangular resection of flail segment of posterior leaflet, artificial Gore-tex neochord placement x6 and 30 mm Sorin Memo 4D ring annuloplasty via right mini thoracotomy approach   Shortness of breath    Tuberculosis    Tests  positive for PPD. dad had h/o TB.   Allergies: No Known Allergies   Surgical History:  She  has a past surgical history that includes Cholecystectomy (01/2010); Nephrectomy radical (01/2010); Tubal ligation (1970's); Diagnostic laparoscopy; Ovarian cyst removal (1970's); Appendectomy (1970's); TEE without cardioversion (10/01/2011); US  ECHOCARDIOGRAPHY (09/2011); Cardiac catheterization (09/2011); Multiple extractions with alveoloplasty (10/06/2011); left and right heart catheterization with coronary angiogram (N/A, 09/30/2011); Kidney surgery; TEE without cardioversion (N/A, 03/18/2019); Bubble study (03/18/2019); RIGHT/LEFT HEART CATH AND CORONARY ANGIOGRAPHY (N/A, 03/22/2019); Mitral valve repair (Right, 04/20/2019); TEE without cardioversion (N/A, 04/20/2019); Abdominal aortic endovascular stent graft (N/A, 01/10/2022); and ORIF wrist fracture (Right, 02/17/2022). Family History:  Her family history includes COPD in her mother; Diabetes in her brother and brother.  REVIEW OF SYSTEMS  : All other systems reviewed and negative except where noted in the History of Present Illness.  PHYSICAL EXAM: There were no vitals taken for this visit. Physical Exam          Alan JONELLE Coombs, PA-C 8:05 AM

## 2024-07-09 ENCOUNTER — Other Ambulatory Visit: Payer: Self-pay

## 2024-07-09 ENCOUNTER — Emergency Department (HOSPITAL_COMMUNITY)
Admission: EM | Admit: 2024-07-09 | Discharge: 2024-07-09 | Discharge status: Against Medical Advice | Payer: Medicare (Managed Care) | Attending: Emergency Medicine | Admitting: Emergency Medicine

## 2024-07-09 DIAGNOSIS — Z5321 Procedure and treatment not carried out due to patient leaving prior to being seen by health care provider: Secondary | ICD-10-CM | POA: Diagnosis not present

## 2024-07-09 DIAGNOSIS — R109 Unspecified abdominal pain: Secondary | ICD-10-CM | POA: Diagnosis not present

## 2024-07-09 DIAGNOSIS — I10 Essential (primary) hypertension: Secondary | ICD-10-CM | POA: Insufficient documentation

## 2024-07-09 DIAGNOSIS — R112 Nausea with vomiting, unspecified: Secondary | ICD-10-CM | POA: Diagnosis present

## 2024-07-09 LAB — COMPREHENSIVE METABOLIC PANEL WITH GFR
ALT: 25 U/L (ref 0–44)
AST: 21 U/L (ref 15–41)
Albumin: 4.1 g/dL (ref 3.5–5.0)
Alkaline Phosphatase: 82 U/L (ref 38–126)
Anion gap: 14 (ref 5–15)
BUN: 34 mg/dL — ABNORMAL HIGH (ref 8–23)
CO2: 26 mmol/L (ref 22–32)
Calcium: 10.3 mg/dL (ref 8.9–10.3)
Chloride: 94 mmol/L — ABNORMAL LOW (ref 98–111)
Creatinine, Ser: 1.58 mg/dL — ABNORMAL HIGH (ref 0.44–1.00)
GFR, Estimated: 34 mL/min — ABNORMAL LOW (ref >60)
Glucose, Bld: 108 mg/dL — ABNORMAL HIGH (ref 70–99)
Potassium: 3.1 mmol/L — ABNORMAL LOW (ref 3.5–5.1)
Sodium: 134 mmol/L — ABNORMAL LOW (ref 135–145)
Total Bilirubin: 0.6 mg/dL (ref 0.0–1.2)
Total Protein: 6.9 g/dL (ref 6.5–8.1)

## 2024-07-09 LAB — CBC
HCT: 44 % (ref 36.0–46.0)
Hemoglobin: 14.6 g/dL (ref 12.0–15.0)
MCH: 29 pg (ref 26.0–34.0)
MCHC: 33.2 g/dL (ref 30.0–36.0)
MCV: 87.5 fL (ref 80.0–100.0)
Platelets: 269 K/uL (ref 150–400)
RBC: 5.03 MIL/uL (ref 3.87–5.11)
RDW: 12.1 % (ref 11.5–15.5)
WBC: 8.4 K/uL (ref 4.0–10.5)
nRBC: 0 % (ref 0.0–0.2)

## 2024-07-09 LAB — LIPASE, BLOOD: Lipase: 50 U/L (ref 11–51)

## 2024-07-09 NOTE — ED Notes (Signed)
 Patient came to the desk and ask me to take her name off because she was leaving.  I made nurse aware

## 2024-07-09 NOTE — ED Triage Notes (Addendum)
 Pt has c/o nausea, vomiting, abdominal pain for several days- also elevated BP

## 2024-07-16 ENCOUNTER — Other Ambulatory Visit: Payer: Self-pay

## 2024-07-16 ENCOUNTER — Inpatient Hospital Stay (HOSPITAL_COMMUNITY)
Admission: EM | Admit: 2024-07-16 | Discharge: 2024-07-25 | DRG: 189 | Disposition: A | Discharge status: Home / Self Care | Payer: Medicare (Managed Care) | Attending: Internal Medicine | Admitting: Internal Medicine

## 2024-07-16 ENCOUNTER — Emergency Department (HOSPITAL_COMMUNITY): Payer: Medicare (Managed Care)

## 2024-07-16 DIAGNOSIS — F1910 Other psychoactive substance abuse, uncomplicated: Secondary | ICD-10-CM

## 2024-07-16 DIAGNOSIS — J441 Chronic obstructive pulmonary disease with (acute) exacerbation: Secondary | ICD-10-CM | POA: Diagnosis present

## 2024-07-16 DIAGNOSIS — I2510 Atherosclerotic heart disease of native coronary artery without angina pectoris: Secondary | ICD-10-CM | POA: Diagnosis not present

## 2024-07-16 DIAGNOSIS — I5030 Unspecified diastolic (congestive) heart failure: Secondary | ICD-10-CM | POA: Diagnosis not present

## 2024-07-16 DIAGNOSIS — I5032 Chronic diastolic (congestive) heart failure: Secondary | ICD-10-CM | POA: Diagnosis present

## 2024-07-16 DIAGNOSIS — I5033 Acute on chronic diastolic (congestive) heart failure: Secondary | ICD-10-CM | POA: Diagnosis present

## 2024-07-16 DIAGNOSIS — E872 Acidosis, unspecified: Secondary | ICD-10-CM | POA: Diagnosis present

## 2024-07-16 DIAGNOSIS — I4892 Unspecified atrial flutter: Secondary | ICD-10-CM | POA: Diagnosis present

## 2024-07-16 DIAGNOSIS — I471 Supraventricular tachycardia, unspecified: Secondary | ICD-10-CM | POA: Diagnosis present

## 2024-07-16 DIAGNOSIS — I700 Atherosclerosis of aorta: Secondary | ICD-10-CM | POA: Diagnosis present

## 2024-07-16 DIAGNOSIS — Z8679 Personal history of other diseases of the circulatory system: Secondary | ICD-10-CM

## 2024-07-16 DIAGNOSIS — I4891 Unspecified atrial fibrillation: Secondary | ICD-10-CM | POA: Diagnosis present

## 2024-07-16 DIAGNOSIS — I050 Rheumatic mitral stenosis: Secondary | ICD-10-CM | POA: Diagnosis present

## 2024-07-16 DIAGNOSIS — I959 Hypotension, unspecified: Secondary | ICD-10-CM | POA: Diagnosis not present

## 2024-07-16 DIAGNOSIS — I350 Nonrheumatic aortic (valve) stenosis: Secondary | ICD-10-CM | POA: Diagnosis not present

## 2024-07-16 DIAGNOSIS — I3139 Other pericardial effusion (noninflammatory): Secondary | ICD-10-CM | POA: Diagnosis present

## 2024-07-16 DIAGNOSIS — I130 Hypertensive heart and chronic kidney disease with heart failure and stage 1 through stage 4 chronic kidney disease, or unspecified chronic kidney disease: Secondary | ICD-10-CM | POA: Diagnosis present

## 2024-07-16 DIAGNOSIS — J440 Chronic obstructive pulmonary disease with acute lower respiratory infection: Secondary | ICD-10-CM | POA: Diagnosis present

## 2024-07-16 DIAGNOSIS — E860 Dehydration: Secondary | ICD-10-CM | POA: Diagnosis present

## 2024-07-16 DIAGNOSIS — R9431 Abnormal electrocardiogram [ECG] [EKG]: Secondary | ICD-10-CM | POA: Diagnosis not present

## 2024-07-16 DIAGNOSIS — K219 Gastro-esophageal reflux disease without esophagitis: Secondary | ICD-10-CM | POA: Diagnosis present

## 2024-07-16 DIAGNOSIS — Z905 Acquired absence of kidney: Secondary | ICD-10-CM

## 2024-07-16 DIAGNOSIS — Z720 Tobacco use: Secondary | ICD-10-CM | POA: Diagnosis not present

## 2024-07-16 DIAGNOSIS — N1831 Chronic kidney disease, stage 3a: Secondary | ICD-10-CM | POA: Diagnosis present

## 2024-07-16 DIAGNOSIS — Z833 Family history of diabetes mellitus: Secondary | ICD-10-CM

## 2024-07-16 DIAGNOSIS — D649 Anemia, unspecified: Secondary | ICD-10-CM | POA: Insufficient documentation

## 2024-07-16 DIAGNOSIS — E785 Hyperlipidemia, unspecified: Secondary | ICD-10-CM | POA: Diagnosis not present

## 2024-07-16 DIAGNOSIS — J189 Pneumonia, unspecified organism: Secondary | ICD-10-CM | POA: Diagnosis not present

## 2024-07-16 DIAGNOSIS — R079 Chest pain, unspecified: Secondary | ICD-10-CM | POA: Diagnosis present

## 2024-07-16 DIAGNOSIS — E876 Hypokalemia: Secondary | ICD-10-CM | POA: Diagnosis present

## 2024-07-16 DIAGNOSIS — Z91119 Patient's noncompliance with dietary regimen due to unspecified reason: Secondary | ICD-10-CM

## 2024-07-16 DIAGNOSIS — I714 Abdominal aortic aneurysm, without rupture, unspecified: Secondary | ICD-10-CM | POA: Diagnosis present

## 2024-07-16 DIAGNOSIS — J129 Viral pneumonia, unspecified: Secondary | ICD-10-CM | POA: Diagnosis present

## 2024-07-16 DIAGNOSIS — E663 Overweight: Secondary | ICD-10-CM | POA: Diagnosis present

## 2024-07-16 DIAGNOSIS — I2583 Coronary atherosclerosis due to lipid rich plaque: Secondary | ICD-10-CM | POA: Diagnosis present

## 2024-07-16 DIAGNOSIS — E0590 Thyrotoxicosis, unspecified without thyrotoxic crisis or storm: Secondary | ICD-10-CM | POA: Diagnosis present

## 2024-07-16 DIAGNOSIS — E7800 Pure hypercholesterolemia, unspecified: Secondary | ICD-10-CM | POA: Diagnosis not present

## 2024-07-16 DIAGNOSIS — N179 Acute kidney failure, unspecified: Secondary | ICD-10-CM | POA: Diagnosis present

## 2024-07-16 DIAGNOSIS — Z23 Encounter for immunization: Secondary | ICD-10-CM | POA: Diagnosis present

## 2024-07-16 DIAGNOSIS — R7989 Other specified abnormal findings of blood chemistry: Secondary | ICD-10-CM

## 2024-07-16 DIAGNOSIS — Z5982 Transportation insecurity: Secondary | ICD-10-CM

## 2024-07-16 DIAGNOSIS — I472 Ventricular tachycardia, unspecified: Secondary | ICD-10-CM | POA: Diagnosis present

## 2024-07-16 DIAGNOSIS — Z9049 Acquired absence of other specified parts of digestive tract: Secondary | ICD-10-CM

## 2024-07-16 DIAGNOSIS — Z85528 Personal history of other malignant neoplasm of kidney: Secondary | ICD-10-CM

## 2024-07-16 DIAGNOSIS — F1410 Cocaine abuse, uncomplicated: Secondary | ICD-10-CM | POA: Diagnosis present

## 2024-07-16 DIAGNOSIS — J439 Emphysema, unspecified: Secondary | ICD-10-CM | POA: Diagnosis present

## 2024-07-16 DIAGNOSIS — Z825 Family history of asthma and other chronic lower respiratory diseases: Secondary | ICD-10-CM

## 2024-07-16 DIAGNOSIS — I493 Ventricular premature depolarization: Secondary | ICD-10-CM | POA: Diagnosis present

## 2024-07-16 DIAGNOSIS — J9601 Acute respiratory failure with hypoxia: Secondary | ICD-10-CM | POA: Diagnosis present

## 2024-07-16 DIAGNOSIS — Z7951 Long term (current) use of inhaled steroids: Secondary | ICD-10-CM

## 2024-07-16 DIAGNOSIS — Z1152 Encounter for screening for COVID-19: Secondary | ICD-10-CM | POA: Diagnosis not present

## 2024-07-16 DIAGNOSIS — B9789 Other viral agents as the cause of diseases classified elsewhere: Secondary | ICD-10-CM | POA: Diagnosis present

## 2024-07-16 DIAGNOSIS — E041 Nontoxic single thyroid nodule: Secondary | ICD-10-CM

## 2024-07-16 DIAGNOSIS — F17210 Nicotine dependence, cigarettes, uncomplicated: Secondary | ICD-10-CM | POA: Diagnosis present

## 2024-07-16 DIAGNOSIS — Z66 Do not resuscitate: Secondary | ICD-10-CM | POA: Diagnosis present

## 2024-07-16 DIAGNOSIS — Z6826 Body mass index (BMI) 26.0-26.9, adult: Secondary | ICD-10-CM

## 2024-07-16 DIAGNOSIS — Z79899 Other long term (current) drug therapy: Secondary | ICD-10-CM

## 2024-07-16 DIAGNOSIS — Z8701 Personal history of pneumonia (recurrent): Secondary | ICD-10-CM

## 2024-07-16 DIAGNOSIS — D509 Iron deficiency anemia, unspecified: Secondary | ICD-10-CM | POA: Diagnosis present

## 2024-07-16 DIAGNOSIS — I059 Rheumatic mitral valve disease, unspecified: Secondary | ICD-10-CM | POA: Diagnosis not present

## 2024-07-16 DIAGNOSIS — Z95828 Presence of other vascular implants and grafts: Secondary | ICD-10-CM

## 2024-07-16 DIAGNOSIS — Z716 Tobacco abuse counseling: Secondary | ICD-10-CM

## 2024-07-16 DIAGNOSIS — I10 Essential (primary) hypertension: Secondary | ICD-10-CM | POA: Diagnosis not present

## 2024-07-16 DIAGNOSIS — I5031 Acute diastolic (congestive) heart failure: Secondary | ICD-10-CM | POA: Diagnosis not present

## 2024-07-16 LAB — COMPREHENSIVE METABOLIC PANEL WITH GFR
ALT: 11 U/L (ref 0–44)
AST: 22 U/L (ref 15–41)
Albumin: 3.5 g/dL (ref 3.5–5.0)
Alkaline Phosphatase: 68 U/L (ref 38–126)
Anion gap: 12 (ref 5–15)
BUN: 15 mg/dL (ref 8–23)
CO2: 25 mmol/L (ref 22–32)
Calcium: 9.3 mg/dL (ref 8.9–10.3)
Chloride: 100 mmol/L (ref 98–111)
Creatinine, Ser: 1.22 mg/dL — ABNORMAL HIGH (ref 0.44–1.00)
GFR, Estimated: 46 mL/min — ABNORMAL LOW (ref >60)
Glucose, Bld: 148 mg/dL — ABNORMAL HIGH (ref 70–99)
Potassium: 3.1 mmol/L — ABNORMAL LOW (ref 3.5–5.1)
Sodium: 137 mmol/L (ref 135–145)
Total Bilirubin: 0.3 mg/dL (ref 0.0–1.2)
Total Protein: 6.1 g/dL — ABNORMAL LOW (ref 6.5–8.1)

## 2024-07-16 LAB — BLOOD GAS, VENOUS
Acid-Base Excess: 3.7 mmol/L — ABNORMAL HIGH (ref 0.0–2.0)
Bicarbonate: 30.5 mmol/L — ABNORMAL HIGH (ref 20.0–28.0)
O2 Saturation: 56.6 %
Patient temperature: 37
pCO2, Ven: 54 mmHg (ref 44–60)
pH, Ven: 7.36 (ref 7.25–7.43)
pO2, Ven: 32 mmHg (ref 32–45)

## 2024-07-16 LAB — CBC WITH DIFFERENTIAL/PLATELET
Abs Immature Granulocytes: 0.02 K/uL (ref 0.00–0.07)
Basophils Absolute: 0 K/uL (ref 0.0–0.1)
Basophils Relative: 0 %
Eosinophils Absolute: 0.1 K/uL (ref 0.0–0.5)
Eosinophils Relative: 1 %
HCT: 34.2 % — ABNORMAL LOW (ref 36.0–46.0)
Hemoglobin: 11 g/dL — ABNORMAL LOW (ref 12.0–15.0)
Immature Granulocytes: 0 %
Lymphocytes Relative: 12 %
Lymphs Abs: 1.2 K/uL (ref 0.7–4.0)
MCH: 29.7 pg (ref 26.0–34.0)
MCHC: 32.2 g/dL (ref 30.0–36.0)
MCV: 92.4 fL (ref 80.0–100.0)
Monocytes Absolute: 0.7 K/uL (ref 0.1–1.0)
Monocytes Relative: 7 %
Neutro Abs: 7.8 K/uL — ABNORMAL HIGH (ref 1.7–7.7)
Neutrophils Relative %: 80 %
Platelets: 205 K/uL (ref 150–400)
RBC: 3.7 MIL/uL — ABNORMAL LOW (ref 3.87–5.11)
RDW: 12.8 % (ref 11.5–15.5)
WBC: 9.7 K/uL (ref 4.0–10.5)
nRBC: 0 % (ref 0.0–0.2)

## 2024-07-16 LAB — RESP PANEL BY RT-PCR (RSV, FLU A&B, COVID)  RVPGX2
Influenza A by PCR: NEGATIVE
Influenza B by PCR: NEGATIVE
Resp Syncytial Virus by PCR: NEGATIVE
SARS Coronavirus 2 by RT PCR: NEGATIVE

## 2024-07-16 LAB — PROTIME-INR
INR: 1 (ref 0.8–1.2)
Prothrombin Time: 13.5 s (ref 11.4–15.2)

## 2024-07-16 LAB — I-STAT CG4 LACTIC ACID, ED: Lactic Acid, Venous: 3 mmol/L (ref 0.5–1.9)

## 2024-07-16 MED ORDER — SODIUM CHLORIDE 0.9 % IV SOLN
2.0000 g | Freq: Once | INTRAVENOUS | Status: AC
  Administered 2024-07-16: 2 g via INTRAVENOUS
  Filled 2024-07-16: qty 20

## 2024-07-16 MED ORDER — NICOTINE 14 MG/24HR TD PT24
14.0000 mg | MEDICATED_PATCH | Freq: Every day | TRANSDERMAL | Status: DC
  Administered 2024-07-17 – 2024-07-25 (×9): 14 mg via TRANSDERMAL
  Filled 2024-07-16 (×9): qty 1

## 2024-07-16 MED ORDER — MAGNESIUM SULFATE 2 GM/50ML IV SOLN
2.0000 g | Freq: Once | INTRAVENOUS | Status: AC
  Administered 2024-07-16: 2 g via INTRAVENOUS
  Filled 2024-07-16: qty 50

## 2024-07-16 MED ORDER — ALBUTEROL SULFATE (2.5 MG/3ML) 0.083% IN NEBU
2.5000 mg | INHALATION_SOLUTION | Freq: Four times a day (QID) | RESPIRATORY_TRACT | Status: DC | PRN
  Administered 2024-07-16: 2.5 mg via RESPIRATORY_TRACT
  Filled 2024-07-16: qty 3

## 2024-07-16 MED ORDER — POTASSIUM CHLORIDE 10 MEQ/100ML IV SOLN
10.0000 meq | INTRAVENOUS | Status: AC
  Administered 2024-07-17 (×4): 10 meq via INTRAVENOUS
  Filled 2024-07-16 (×4): qty 100

## 2024-07-16 MED ORDER — HYDROCODONE-ACETAMINOPHEN 5-325 MG PO TABS: 1.0000 | ORAL_TABLET | ORAL | Status: DC | PRN

## 2024-07-16 MED ORDER — SUCRALFATE 1 G PO TABS: 1.0000 g | ORAL_TABLET | Freq: Three times a day (TID) | ORAL | Status: DC

## 2024-07-16 MED ORDER — IPRATROPIUM-ALBUTEROL 0.5-2.5 (3) MG/3ML IN SOLN
3.0000 mL | Freq: Four times a day (QID) | RESPIRATORY_TRACT | Status: DC
  Administered 2024-07-17 – 2024-07-18 (×8): 3 mL via RESPIRATORY_TRACT
  Filled 2024-07-16 (×8): qty 3

## 2024-07-16 MED ORDER — SODIUM CHLORIDE 0.9 % IV SOLN
100.0000 mg | Freq: Once | INTRAVENOUS | Status: AC
  Administered 2024-07-16: 100 mg via INTRAVENOUS
  Filled 2024-07-16: qty 100

## 2024-07-16 MED ORDER — ALBUTEROL SULFATE HFA 108 (90 BASE) MCG/ACT IN AERS: 2.0000 | INHALATION_SPRAY | Freq: Four times a day (QID) | RESPIRATORY_TRACT | Status: DC | PRN

## 2024-07-16 MED ORDER — SODIUM CHLORIDE 0.9% FLUSH
3.0000 mL | Freq: Two times a day (BID) | INTRAVENOUS | Status: DC
  Administered 2024-07-16 – 2024-07-23 (×13): 3 mL via INTRAVENOUS

## 2024-07-16 MED ORDER — LACTATED RINGERS IV BOLUS
1000.0000 mL | Freq: Once | INTRAVENOUS | Status: AC
  Administered 2024-07-16: 1000 mL via INTRAVENOUS

## 2024-07-16 MED ORDER — PANTOPRAZOLE SODIUM 40 MG PO TBEC
40.0000 mg | DELAYED_RELEASE_TABLET | Freq: Every day | ORAL | Status: DC
  Administered 2024-07-18 – 2024-07-25 (×8): 40 mg via ORAL
  Filled 2024-07-16 (×8): qty 1

## 2024-07-16 MED ORDER — ACETAMINOPHEN 325 MG PO TABS: 650.0000 mg | ORAL_TABLET | Freq: Four times a day (QID) | ORAL | Status: DC | PRN

## 2024-07-16 MED ORDER — IPRATROPIUM-ALBUTEROL 0.5-2.5 (3) MG/3ML IN SOLN
3.0000 mL | Freq: Once | RESPIRATORY_TRACT | Status: AC
  Administered 2024-07-16: 3 mL via RESPIRATORY_TRACT
  Filled 2024-07-16: qty 3

## 2024-07-16 MED ORDER — ROSUVASTATIN CALCIUM 20 MG PO TABS
40.0000 mg | ORAL_TABLET | Freq: Every day | ORAL | Status: DC
  Administered 2024-07-18 – 2024-07-25 (×8): 40 mg via ORAL
  Filled 2024-07-16 (×8): qty 2

## 2024-07-16 MED ORDER — SODIUM CHLORIDE 0.9 % IV SOLN
2.0000 g | INTRAVENOUS | Status: AC
  Administered 2024-07-17 – 2024-07-21 (×4): 2 g via INTRAVENOUS
  Filled 2024-07-16 (×4): qty 20

## 2024-07-16 MED ORDER — METOPROLOL TARTRATE 25 MG PO TABS: 50.0000 mg | ORAL_TABLET | Freq: Two times a day (BID) | ORAL | Status: DC

## 2024-07-16 MED ORDER — ALBUTEROL SULFATE (2.5 MG/3ML) 0.083% IN NEBU: 2.5000 mg | INHALATION_SOLUTION | RESPIRATORY_TRACT | Status: DC | PRN

## 2024-07-16 MED ORDER — SODIUM CHLORIDE 0.9 % IV SOLN
INTRAVENOUS | Status: DC
Start: 2024-07-16 — End: 2024-07-16

## 2024-07-16 MED ORDER — FAMOTIDINE 20 MG PO TABS: 20.0000 mg | ORAL_TABLET | Freq: Two times a day (BID) | ORAL | Status: DC

## 2024-07-16 MED ORDER — METHYLPREDNISOLONE SODIUM SUCC 125 MG IJ SOLR
125.0000 mg | Freq: Once | INTRAMUSCULAR | Status: AC
  Administered 2024-07-16: 125 mg via INTRAVENOUS
  Filled 2024-07-16: qty 2

## 2024-07-16 MED ORDER — SODIUM CHLORIDE 0.9 % IV SOLN
100.0000 mg | Freq: Two times a day (BID) | INTRAVENOUS | Status: AC
  Administered 2024-07-17 – 2024-07-20 (×8): 100 mg via INTRAVENOUS
  Filled 2024-07-16 (×8): qty 100

## 2024-07-16 MED ORDER — PREDNISONE 20 MG PO TABS: 40.0000 mg | ORAL_TABLET | Freq: Every day | ORAL | Status: DC

## 2024-07-16 MED ORDER — METHYLPREDNISOLONE SODIUM SUCC 40 MG IJ SOLR
40.0000 mg | Freq: Two times a day (BID) | INTRAMUSCULAR | Status: DC
  Filled 2024-07-16: qty 1

## 2024-07-16 MED ORDER — LACTATED RINGERS IV SOLN: INTRAVENOUS | Status: DC

## 2024-07-16 MED ORDER — ACETAMINOPHEN 650 MG RE SUPP: 650.0000 mg | Freq: Four times a day (QID) | RECTAL | Status: DC | PRN

## 2024-07-16 MED ORDER — FENTANYL CITRATE PF 50 MCG/ML IJ SOSY
12.5000 ug | PREFILLED_SYRINGE | INTRAMUSCULAR | Status: DC | PRN
  Administered 2024-07-17: 50 ug via INTRAVENOUS
  Filled 2024-07-16 (×2): qty 1

## 2024-07-16 NOTE — ED Notes (Signed)
 Patient c/o increasing SHOB.  PRN treatment given at this time

## 2024-07-16 NOTE — ED Notes (Signed)
 Patient requesting to attempt to have BiPAP replaced at this time.  RT called and made aware

## 2024-07-16 NOTE — H&P (Signed)
 Katie [name] FMW:[id number] DOB: 1946/10/19 DOA: 07/16/2024     PCP: [name], MD   Outpatient Specialists:  [facility]  Dr. Annabella Scarce, MD  NEphrology:   Dr. Tobie, Gordy POUR, MD    Patient arrived to ER on 07/16/24 at 1900 Referred by Attending Ula Prentice SAUNDERS, MD   Patient coming from:    home Lives   With family    Chief Complaint:   Chief Complaint  Patient presents with   Shortness of Breath    HPI: Katie [name] is a 78 y.o. female with medical history significant of COPD, tobacco abuse, AAA with graft.  Diastolic CHF, tobacco abuse, HTN, HLD, CKD stage IIIa,    Presented with shortness of breath cough increased wheezing Patient with known COPD came in with SOB and cough for 3 days EMS gave duoneb  Started on rocephin  and doxy  CXR showed PNA  Noted to be tachycardic and tachypnea Did not do well on BiPAP for rest  No hypercarbia Initial lactic acid 3   No fevers or chills reports have had significant shortness of breath for the past 2 days  Denies significant ETOH intake   Does  smoke  but interested in quitting       Regarding pertinent Chronic problems:     Hyperlipidemia - on statins  crestor  Lipid Panel     Component Value Date/Time   CHOL 307 (H) 10/31/2019 1432   TRIG 342.0 (H) 10/31/2019 1432   HDL 62.60 10/31/2019 1432   CHOLHDL 5 10/31/2019 1432   VLDL 68.4 (H) 10/31/2019 1432   LDLCALC 115 (H) 09/29/2018 0230   LDLDIRECT 151.0 10/31/2019 1432     HTN on metoprolol , cozaar    chronic CHF diastolic  - last echo  Recent Results (from the past 56199 hours)  ECHOCARDIOGRAM COMPLETE   Collection Time: 01/11/22  2:48 PM  Result Value   Weight 2,543.23   Height 61   BP 130/87   S' Lateral 2.60   Area-P 1/2 2.37   MV VTI 1.50   Narrative      ECHOCARDIOGRAM REPORT       IMPRESSIONS    1. Left ventricular ejection fraction, by estimation, is 65 to 70%. The left ventricle has normal function. The left ventricle has no  regional wall motion abnormalities. Left ventricular diastolic parameters are indeterminate. LVOT peak gradient 15 mmHg  2. Right ventricular systolic function is mildly reduced. The right ventricular size is normal. There is normal pulmonary artery systolic pressure. The estimated right ventricular systolic pressure is 30.5 mmHg.  3. Left atrial size was moderately dilated.  4. A small pericardial effusion is present.  5. There is a prosthetic annuloplasty ring present in the mitral position     Trivial mitral valve regurgitation. Moderate mitral stenosis. MG at 74 bpm, MVA 1.5 cm^2 by continuity equation  6. The aortic valve was not well visualized. Aortic valve regurgitation is not visualized. No aortic stenosis is present.  7. The inferior vena cava is normal in size with greater than 50% respiratory variability, suggesting right atrial pressure of 3 mmHg.                              COPD - not  on baseline oxygen         A. Fib -   atrial fibrillation CHA2DS2 vas score     6    Not on  anticoagulation          -  Rate control:  Currently controlled with  Metoprolol       CKD stage IIIb baseline Cr 1.8 CrCl cannot be calculated (Unknown ideal weight.).  Lab Results  Component Value Date   CREATININE 1.22 (H) 07/16/2024   CREATININE 1.58 (H) 07/09/2024   CREATININE 1.87 (H) 06/01/2024   Lab Results  Component Value Date   NA 137 07/16/2024   CL 100 07/16/2024   K 3.1 (L) 07/16/2024   CO2 25 07/16/2024   BUN 15 07/16/2024   CREATININE 1.22 (H) 07/16/2024   GFRNONAA 46 (L) 07/16/2024   CALCIUM  9.3 07/16/2024   PHOS 4.1 03/13/2019   ALBUMIN  3.5 07/16/2024   GLUCOSE 148 (H) 07/16/2024      Chronic anemia - baseline hg Hemoglobin & Hematocrit  Recent Labs    06/01/24 0548 07/09/24 1709 07/16/24 1940  HGB 16.7* 14.6 11.0*       While in ER:     Noted to have increased wheezing treated with DuoNeb    Lab Orders         Resp panel by RT-PCR (RSV, Flu A&B,  Covid) Anterior Nasal Swab         Blood Culture (routine x 2)         Comprehensive metabolic panel         CBC with Differential         Protime-INR         Blood gas, venous (at [facility] and [facility])         I-Stat Lactic Acid, ED       CXR -chest x-ray worrisome for hazy opacities in the right mid and lower lung suspicious for pneumonia    Following Medications were ordered in ER: Medications  lactated ringers  infusion (has no administration in time range)  doxycycline  (VIBRAMYCIN ) 100 mg in sodium chloride  0.9 % 250 mL IVPB (has no administration in time range)  lactated ringers  bolus 1,000 mL (has no administration in time range)  ipratropium-albuterol  (DUONEB) 0.5-2.5 (3) MG/3ML nebulizer solution 3 mL (3 mLs Nebulization Given 07/16/24 1957)  cefTRIAXone  (ROCEPHIN ) 2 g in sodium chloride  0.9 % 100 mL IVPB (2 g Intravenous New Bag/Given 07/16/24 2004)        ED Triage Vitals  Encounter Vitals Group     BP 07/16/24 1908 (!) 148/98     Girls Systolic BP Percentile --      Girls Diastolic BP Percentile --      Boys Systolic BP Percentile --      Boys Diastolic BP Percentile --      Pulse Rate 07/16/24 1908 (!) 118     Resp 07/16/24 1908 (!) 36     Temp 07/16/24 1907 99.3 F (37.4 C)     Temp Source 07/16/24 1907 Axillary     SpO2 07/16/24 1908 100 %     Weight --      Height --      Head Circumference --      Peak Flow --      Pain Score 07/16/24 1917 0     Pain Loc --      Pain Education --      Exclude from Growth Chart --   UFJK(75)@     _________________________________________ Significant initial  Findings: Abnormal Labs Reviewed  COMPREHENSIVE METABOLIC PANEL WITH GFR - Abnormal; Notable for the following components:      Result Value   Potassium  3.1 (*)    Glucose, Bld 148 (*)    Creatinine, Ser 1.22 (*)    Total Protein 6.1 (*)    GFR, Estimated 46 (*)    All other components within normal limits  CBC WITH DIFFERENTIAL/PLATELET - Abnormal; Notable for the  following components:   RBC 3.70 (*)    Hemoglobin 11.0 (*)    HCT 34.2 (*)    Neutro Abs 7.8 (*)    All other components within normal limits  BLOOD GAS, VENOUS - Abnormal; Notable for the following components:   Bicarbonate 30.5 (*)    Acid-Base Excess 3.7 (*)    All other components within normal limits  I-STAT CG4 LACTIC ACID, ED - Abnormal; Notable for the following components:   Lactic Acid, Venous 3.0 (*)    All other components within normal limits       ECG: Ordered Personally reviewed and interpreted by me showing: HR : 113 Rhythm:Sinus tachycardia Abnormal R-wave progression, early transition ST elevation, consider inferior injury Prolonged QT interval QTC 509  BNP (last 3 results) No results for input(s): BNP in the last 8760 hours.      The recent clinical data is shown below. Vitals:   07/16/24 1945 07/16/24 1951 07/16/24 1957 07/16/24 2100  BP: 113/87   121/88  Pulse: (!) 119 (!) 119  (!) 123  Resp: (!) 33 (!) 24  (!) 35  Temp:      TempSrc:      SpO2: 100% 100% 100% 100%    WBC     Component Value Date/Time   WBC 9.7 07/16/2024 1940   LYMPHSABS 1.2 07/16/2024 1940   MONOABS 0.7 07/16/2024 1940   EOSABS 0.1 07/16/2024 1940   BASOSABS 0.0 07/16/2024 1940    Lactic Acid, Venous    Component Value Date/Time   LATICACIDVEN 3.0 (HH) 07/16/2024 1953       Procalcitonin   Ordered     Results for orders placed or performed during the hospital encounter of 06/01/24  Culture, blood (Routine x 2)     Status: None   Collection Time: 06/01/24  5:37 AM   Specimen: BLOOD  Result Value Ref Range Status   Specimen Description BLOOD LEFT ANTECUBITAL  Final   Special Requests   Final    BOTTLES DRAWN AEROBIC AND ANAEROBIC Blood Culture adequate volume   Culture   Final    NO GROWTH 5 DAYS Performed at [facility], [address] [place], [place] [address]    Report Status 06/06/2024 FINAL  Final  Culture, blood (Routine x 2)     Status:  None   Collection Time: 06/01/24  5:48 AM   Specimen: BLOOD RIGHT ARM  Result Value Ref Range Status   Specimen Description BLOOD RIGHT ARM  Final   Special Requests   Final    BOTTLES DRAWN AEROBIC AND ANAEROBIC Blood Culture adequate volume   Culture   Final    NO GROWTH 5 DAYS Performed at [facility], [address], [place], [place] [address]    Report Status 06/06/2024 FINAL  Final    ABX started Antibiotics Given (last 72 hours)     Date/Time Action Medication Dose Rate   07/16/24 2004 New Bag/Given   cefTRIAXone  (ROCEPHIN ) 2 g in sodium chloride  0.9 % 100 mL IVPB 2 g 200 mL/hr       _____ Venous  Blood Gas result:  pH  7.36 Acid-Base Excess 3.7 High  mmol/L  pCO2, Ven 54 mmHg O2 Saturation 56.6 %  pO2, Ven 32 mmHg      __________________________________________________________ Recent Labs  Lab 07/16/24 1940  NA 137  K 3.1*  CO2 25  GLUCOSE 148*  BUN 15  CREATININE 1.22*  CALCIUM  9.3    Cr  down from baseline see below Lab Results  Component Value Date   CREATININE 1.22 (H) 07/16/2024   CREATININE 1.58 (H) 07/09/2024   CREATININE 1.87 (H) 06/01/2024    Recent Labs  Lab 07/16/24 1940  AST 22  ALT 11  ALKPHOS 68  BILITOT 0.3  PROT 6.1*  ALBUMIN  3.5   Lab Results  Component Value Date   CALCIUM  9.3 07/16/2024   PHOS 4.1 03/13/2019    Plt: Lab Results  Component Value Date   PLT 205 07/16/2024         Recent Labs  Lab 07/16/24 1940  WBC 9.7  NEUTROABS 7.8*  HGB 11.0*  HCT 34.2*  MCV 92.4  PLT 205    HG/HCT   Down   from baseline see below    Component Value Date/Time   HGB 11.0 (L) 07/16/2024 1940   HCT 34.2 (L) 07/16/2024 1940   MCV 92.4 07/16/2024 1940       _______________________________________________ Hospitalist was called for admission for   Pneumonia  COPD exacerbation    The following Work up has been ordered so far:  Orders Placed This Encounter  Procedures   Resp panel by RT-PCR (RSV, Flu A&B,  Covid) Anterior Nasal Swab   Blood Culture (routine x 2)   DG Chest Port 1 View   Comprehensive metabolic panel   CBC with Differential   Protime-INR   Blood gas, venous (at [facility])   Diet NPO time specified   Document height and weight   Assess and Document Glasgow Coma Scale   Document vital signs within 1-hour of fluid bolus completion. Notify provider of abnormal vital signs despite fluid resuscitation.   DO NOT delay antibiotics if unable to obtain blood culture.   Refer to Sidebar Report: Sepsis Sidebar ED/IP   Notify provider for difficulties obtaining IV access.   Insert peripheral IV x 2   Initiate Carrier Fluid Protocol   Code Sepsis activation.  This occurs automatically when order is signed and prioritizes pharmacy, lab, and radiology services for STAT collections and interventions.  If [facility] downtime, call [facility] [phone]) to activate Code Sepsis.   monitoring by pharmacy   Consult to hospitalist   Bipap   I-Stat Lactic Acid, ED   ED EKG         Cultures:    Component Value Date/Time   SDES BLOOD RIGHT ARM 06/01/2024 0548   SPECREQUEST  06/01/2024 0548    BOTTLES DRAWN AEROBIC AND ANAEROBIC Blood Culture adequate volume   CULT  06/01/2024 0548    NO GROWTH 5 DAYS Performed at [facility], [address], [place], [place] [address]    REPTSTATUS 06/06/2024 FINAL 06/01/2024 0548     Radiological Exams on Admission: DG Chest Port 1 View Result Date: 07/16/2024 EXAM: 1 VIEW(S) XRAY OF THE CHEST 07/16/2024 08:08:00 PM COMPARISON: 06/01/2024 CLINICAL HISTORY: Questionable sepsis - evaluate for abnormality. Questionable sepsis - evaluate for abnormality; SOB FINDINGS: LUNGS AND PLEURA: Hazy opacity in the right mid and lower lung. Mild chronic interstitial lung markings. No pulmonary edema. No pleural effusion. No pneumothorax. HEART AND MEDIASTINUM: Atherosclerotic plaque noted. No acute abnormality of the cardiac and mediastinal silhouettes.  BONES AND  SOFT TISSUES: No acute osseous abnormality. IMPRESSION: 1. Hazy opacities in the right mid and lower lung suspicious for pneumonia. Follow up in 8-10 weeks is recommended to ensure resolution. Electronically signed by: Norman Gatlin MD 07/16/2024 08:14 PM EDT RP Workstation: [id number]   _______________________________________________________________________________________________________ Latest  Blood pressure 121/88, pulse (!) 123, temperature 99.3 F (37.4 C), temperature source Axillary, resp. rate (!) 35, SpO2 100%.   Vitals  labs and radiology finding personally reviewed  Review of Systems:    Pertinent positives include:  shortness of breath at rest. dyspnea on exertion  Constitutional:  No weight loss, night sweats, Fevers, chills, fatigue, weight loss  HEENT:  No headaches, Difficulty swallowing,Tooth/dental problems,Sore throat,  No sneezing, itching, ear ache, nasal congestion, post nasal drip,  Cardio-vascular:  No chest pain, Orthopnea, PND, anasarca, dizziness, palpitations.no Bilateral lower extremity swelling  GI:  No heartburn, indigestion, abdominal pain, nausea, vomiting, diarrhea, change in bowel habits, loss of appetite, melena, blood in stool, hematemesis Resp:  no , No excess mucus, no productive cough, No non-productive cough, No coughing up of blood.No change in color of mucus.No wheezing. Skin:  no rash or lesions. No jaundice GU:  no dysuria, change in color of urine, no urgency or frequency. No straining to urinate.  No flank pain.  Musculoskeletal:  No joint pain or no joint swelling. No decreased range of motion. No back pain.  Psych:  No change in mood or affect. No depression or anxiety. No memory loss.  Neuro: no localizing neurological complaints, no tingling, no weakness, no double vision, no gait abnormality, no slurred speech, no confusion  All systems reviewed and apart from HOPI all are  negative _______________________________________________________________________________________________ Past Medical History:   Past Medical History:  Diagnosis Date   AAA (abdominal aortic aneurysm)    AKI (acute kidney injury)    Arthritis    Chest pain 04/17/2016   Cholelithiasis 2011   s/p cholescystectomy    Chronic diastolic congestive heart failure (HCC)    COPD (chronic obstructive pulmonary disease) (HCC)    GERD (gastroesophageal reflux disease)    History of renal cell carcinoma    Hypercholesteremia    Hypertension    Hypertension    Mitral regurgitation    Nausea and vomiting 06/19/2022   Pneumonia 1980's   Renal cell carcinoma 01/2010   s/p right radical nephrectomy 01/2010,  followed by [facility]   S/P minimally invasive mitral valve repair 04/20/2019   Complex valvuloplasty including triangular resection of flail segment of posterior leaflet, artificial Gore-tex neochord placement x6 and 30 mm Sorin Memo 4D ring annuloplasty via right mini thoracotomy approach   Shortness of breath    Tuberculosis    Tests positive for PPD. dad had h/o TB.     Past Surgical History:  Procedure Laterality Date   ABDOMINAL AORTIC ENDOVASCULAR STENT GRAFT N/A 01/10/2022   Procedure: ABDOMINAL AORTIC ENDOVASCULAR STENT GRAFT;  Surgeon: Gretta Lonni PARAS, MD;  Location: [facility] OR;  Service: Vascular;  Laterality: N/A;   APPENDECTOMY  1970's   BUBBLE STUDY  03/18/2019   Procedure: BUBBLE STUDY;  Surgeon: Okey Vina GAILS, MD;  Location: [facility] ENDOSCOPY;  Service: Cardiovascular;;   CARDIAC CATHETERIZATION  09/2011   CHOLECYSTECTOMY  01/2010   DIAGNOSTIC LAPAROSCOPY     KIDNEY SURGERY     LEFT AND RIGHT HEART CATHETERIZATION WITH CORONARY ANGIOGRAM N/A 09/30/2011   Procedure: LEFT AND RIGHT HEART CATHETERIZATION WITH CORONARY ANGIOGRAM;  Surgeon: Oneil Parchment, MD;  Location: [facility]  CATH LAB;  Service: Cardiovascular;  Laterality: N/A;   MITRAL VALVE REPAIR Right 04/20/2019   Procedure:  MINIMALLY INVASIVE MITRAL VALVE REPAIR (MVR) USING MEMO 4D SIZE 30;  Surgeon: Dusty Sudie DEL, MD;  Location: [facility] OR;  Service: Open Heart Surgery;  Laterality: Right;   MULTIPLE EXTRACTIONS WITH ALVEOLOPLASTY  10/06/2011   Procedure: MULTIPLE EXTRACION WITH ALVEOLOPLASTY;  Surgeon: Tanda JULIANNA Fanny, DDS;  Location: [facility] OR;  Service: Oral Surgery;  Laterality: N/A;  Multiple extraction of tooth #'s 1, 6, 8, 10, 11, 22, 23, 26, 27, 28, 29 with alveoloplasty and Upper right buccal exostoses reductions.   NEPHRECTOMY RADICAL  01/2010   right    ORIF WRIST FRACTURE Right 02/17/2022   Procedure: OPEN REDUCTION INTERNAL FIXATION (ORIF) WRIST FRACTURE;  Surgeon: Shari Easter, MD;  Location: [facility] OR;  Service: Orthopedics;  Laterality: Right;  with IV sedation   OVARIAN CYST REMOVAL  1970's   went through belly button   RIGHT/LEFT HEART CATH AND CORONARY ANGIOGRAPHY N/A 03/22/2019   Procedure: RIGHT/LEFT HEART CATH AND CORONARY ANGIOGRAPHY;  Surgeon: Dann Candyce RAMAN, MD;  Location: [facility] INVASIVE CV LAB;  Service: Cardiovascular;  Laterality: N/A;   TEE WITHOUT CARDIOVERSION  10/01/2011   Procedure: TRANSESOPHAGEAL ECHOCARDIOGRAM (TEE);  Surgeon: Candyce CANDIE Dann;  Location: [facility] ENDOSCOPY;  Service: Cardiovascular;  Laterality: N/A;   TEE WITHOUT CARDIOVERSION N/A 03/18/2019   Procedure: TRANSESOPHAGEAL ECHOCARDIOGRAM (TEE);  Surgeon: Okey Vina GAILS, MD;  Location: [facility] ENDOSCOPY;  Service: Cardiovascular;  Laterality: N/A;   TEE WITHOUT CARDIOVERSION N/A 04/20/2019   Procedure: TRANSESOPHAGEAL ECHOCARDIOGRAM (TEE);  Surgeon: Dusty Sudie DEL, MD;  Location: [facility] OR;  Service: Open Heart Surgery;  Laterality: N/A;   TUBAL LIGATION  1970's   US  ECHOCARDIOGRAPHY  09/2011    Social History:  Ambulatory   independently      reports that she has been smoking cigarettes. She started smoking about 58 years ago. She has a 26.5 pack-year smoking history. She has been exposed to tobacco smoke. She has never used  smokeless tobacco. She reports that she does not currently use alcohol. She reports current drug use. Drug: Cocaine.   Family History:   Family History  Problem Relation Age of Onset   COPD Mother        DECEASED/SMOKED   Diabetes Brother    Diabetes Brother    Colon cancer Neg Hx    Rectal cancer Neg Hx    Esophageal cancer Neg Hx    Stomach cancer Neg Hx    ______________________________________________________________________________________________ Allergies: No Known Allergies   Prior to Admission medications   Medication Sig Start Date End Date Taking? Authorizing Provider  albuterol  (VENTOLIN  HFA) 108 (90 Base) MCG/ACT inhaler Inhale 2 puffs into the lungs every 6 (six) hours as needed for wheezing or shortness of breath. 02/08/20   Gladis Elsie BROCKS, PA-C  cephALEXin  (KEFLEX ) 500 MG capsule Take 1 capsule (500 mg total) by mouth 4 (four) times daily. 05/04/24   Logan Ubaldo NOVAK, PA-C  Doxylamine  Succinate, Sleep, (SLEEP-AID PO) Take 1 tablet by mouth daily as needed.    [provider]  famotidine  (PEPCID ) 20 MG tablet Take 1 tablet (20 mg total) by mouth 2 (two) times daily. 03/13/24   Vicky Charleston, PA-C  Fluticasone -Umeclidin-Vilant (TRELEGY ELLIPTA ) 200-62.5-25 MCG/ACT AEPB Inhale 1 puff into the lungs daily. 03/05/23   Hunsucker, Donnice SAUNDERS, MD  losartan  (COZAAR ) 50 MG tablet Take 50 mg by mouth daily.    [provider]  metoprolol  tartrate (  LOPRESSOR ) 50 MG tablet Take 50 mg by mouth 2 (two) times daily. 03/02/23   [provider]  pantoprazole  (PROTONIX ) 40 MG tablet Take 1 tablet (40 mg total) by mouth daily. 08/18/22   Zehr, Jessica D, PA-C  rosuvastatin  (CRESTOR ) 40 MG tablet Take 40 mg by mouth daily. 06/11/22   [provider]  sucralfate  (CARAFATE ) 1 g tablet Take 1 tablet (1 g total) by mouth 4 (four) times daily -  with meals and at bedtime. 03/13/24   Vicky Charleston, PA-C     ___________________________________________________________________________________________________ Physical Exam:    07/16/2024    9:00 PM 07/16/2024    7:51 PM 07/16/2024    7:45 PM  Vitals with BMI  Systolic 121  113  Diastolic 88  87  Pulse 123 119 119    1. General:  in No  Acute distress   Chronically ill  -appearing 2. Psychological: Alert and   Oriented 3. Head/ENT:    Dry Mucous Membranes                          Head Non traumatic, neck supple                          Poor Dentition 4. SKIN: decreased Skin turgor,  Skin clean Dry and intact no rash    5. Heart: Regular rate and rhythm no  Murmur, no Rub or gallop 6. Lungs: bilateral wheezes some crackles   7. Abdomen: Soft,  non-tender, Non distended  bowel sounds present 8. Lower extremities: no clubbing, cyanosis, no  edema 9. Neurologically Grossly intact, moving all 4 extremities equally   10. MSK: Normal range of motion    Chart has been reviewed  ______________________________________________________________________________________________  Assessment/Plan 78 y.o. female with medical history significant of COPD, tobacco abuse, AAA with graft.  Diastolic CHF, tobacco abuse, HTN, HLD, CKD stage IIIa,  Admitted for   Pneumonia   COPD exacerbation    Present on Admission:  COPD exacerbation (HCC)  AAA (abdominal aortic aneurysm) without rupture  Essential hypertension  Chronic diastolic congestive heart failure (HCC)  CKD stage G3a/A1, GFR 45-59 and albumin  creatinine ratio <30 mg/g (HCC)  HLD (hyperlipidemia)  QT prolongation  Tobacco abuse disorder  CAP (community acquired pneumonia)  Hypokalemia     AAA (abdominal aortic aneurysm) without rupture Chronic stable continue Toprol  50 mg p.o. twice daily  Essential hypertension For right now continue metoprolol  50 mg p.o. twice daily would consider changing to Zebeta  Chronic diastolic congestive heart failure (HCC) Current appears to be  somewhat on the dry side we will gently rehydrate follow fluid status  CKD stage G3a/A1, GFR 45-59 and albumin  creatinine ratio <30 mg/g (HCC)  -chronic avoid nephrotoxic medications such as NSAIDs, Vanco Zosyn combo,  avoid hypotension, continue to follow renal function   COPD exacerbation (HCC)  -  - Will initiate: Steroid taper  -  Antibiotics   Doxycycline , - Albuterol   PRN, - scheduled duoneb,   Titrate O2 to saturation >90%. Follow patients respiratory status.    VBG no evidence of hypercarbia  BiPAP ordered PRN for increased work of breathing.  But patient did not tolerate will hold off for right now  Currently mentating well no evidence of symptomatic hypercarbia   HLD (hyperlipidemia) Chronic stable continue Crestor  40 mg daily  QT prolongation - will monitor on tele avoid QT prolonging medications, rehydrate correct electrolytes   Tobacco  abuse disorder  - Spoke about importance of quitting spent 5 minutes discussing options for treatment, prior attempts at quitting, and dangers of smoking  -At this point patient is     interested in quitting  - order nicotine  patch   - nursing tobacco cessation protocol   CAP (community acquired pneumonia)  - -Patient presenting with  productive cough, and infiltrate in  on chest x-ray -Infiltrate on CXR and 2-3 characteristics (fever, leukocytosis, purulent sputum) are consistent with pneumonia. -This appears to be most likely community-acquired pneumonia.         will admit for treatment of CAP will start on appropriate antibiotic coverage. - Rocephin  doxycycline    Obtain:  sputum cultures,                    influenza serologies negative                  COVID PCR negative                  blood cultures and sputum cultures ordered                   strep pneumo UA antigen,                 check for Legionella antigen.                Provide oxygen  as needed.    Hypokalemia - will replace electrolytes and repeat  check  Mg, phos and Ca level and replace as needed Monitor on telemetry   Lab Results  Component Value Date   K 3.1 (L) 07/16/2024     Lab Results  Component Value Date   CREATININE 1.22 (H) 07/16/2024   Lab Results  Component Value Date   MG 1.9 04/22/2023   Lab Results  Component Value Date   CALCIUM  9.3 07/16/2024   PHOS 4.1 03/13/2019    Chart mentions history of atrial fibrillation patient not on anticoagulation states has not been need to have father clarification regarding this current appears to be in sinus tachycardia  Other plan as per orders.  DVT prophylaxis:  SCD    Code Status:  DNR/DNI as per patient   I had personally discussed CODE STATUS with patient   ACP   none   Family Communication:   Family not at  Bedside    Diet  Diet Orders (From admission, onward)     Start     Ordered   07/16/24 2149  Diet Heart Room service appropriate? Yes; Fluid consistency: Thin  Diet effective now       Question Answer Comment  Room service appropriate? Yes   Fluid consistency: Thin      07/16/24 2149            Disposition Plan:      To home once workup is complete and patient is stable   Following barriers for discharge:                                                        Electrolytes corrected  Work of breathing improves       Consult Orders  (From admission, onward)           Start     Ordered   07/16/24 2252  PT eval and treat  Routine       Question:  Reason for PT?  Answer:  COPD   07/16/24 2253   07/16/24 2252  OT eval and treat  Routine       Question:  Reason for OT?  Answer:  COPD   07/16/24 2253   07/16/24 2252  Consult to respiratory care treatment  Once       Provider:  (Not yet assigned)  Question:  Reason for Consult?  Answer:  Educate patient regarding use of nebulizers and MDI   07/16/24 2253   07/16/24 2252  Consult to Registered Dietitian  Once       Provider:  (Not yet  assigned)  Question:  Reason for consult?  Answer:  Assessment of nutrition requirements/status   07/16/24 2253   07/16/24 2149  PT eval and treat  Routine       Question:  Reason for PT?  Answer:  debility   07/16/24 2149   07/16/24 2149  OT eval and treat  Routine       Question:  Reason for OT?  Answer:  debility   07/16/24 2149   07/16/24 2055  Consult to hospitalist  Once       Provider:  (Not yet assigned)  Question Answer Comment  Place call to: [facility]   Reason for Consult Admit      07/16/24 2055                                Consults called:    NONE   Admission status:  ED Disposition     ED Disposition  Admit   Condition  --   Comment  Hospital Area: [facility] [[id number]]  Level of Care: Progressive [102]  Admit to Progressive based on following criteria: MULTISYSTEM THREATS such as stable sepsis, metabolic/electrolyte imbalance with or without encephalopathy that is responding to early treatment.  Admit to Progressive based on following criteria: RESPIRATORY PROBLEMS hypoxemic/hypercapnic respiratory failure that is responsive to NIPPV (BiPAP) or High Flow Nasal Cannula (6-80 lpm). Frequent assessment/intervention, no > Q2 hrs < Q4 hrs, to maintain oxygenation and pulmonary hygiene.  May admit patient to [facility] or [facility] if equivalent level of care is available:: No  Covid Evaluation: Asymptomatic - no recent exposure (last 10 days) testing not required  Diagnosis: COPD exacerbation [facility]) [[id number]]  Admitting Physician: Admiral Marcucci [[id number]]  Attending Physician: Suhey Radford [[id number]]  Certification:: I certify this patient will need inpatient services for at least 2 midnights  Expected Medical Readiness: 07/19/2024            inpatient     I Expect 2 midnight stay secondary to severity of patient's current illness need for inpatient interventions justified by the following:  hemodynamic instability  despite optimal treatment (tachycardia  tachypnea  )   Severe lab/radiological/exam abnormalities including:    Pneumonia  COPD exacerbation   and extensive comorbidities including: *substance abuse  COPD/asthma   That are currently affecting medical management.   I expect  patient to be hospitalized for 2 midnights requiring inpatient medical care.  Patient is at high risk for adverse outcome (such as loss of life  or disability) if not treated.  Indication for inpatient stay as follows:   Hemodynamic instability despite maximal medical therapy,    Need for IV antibiotics, IV fluids,,     Level of care        progressive      [name] 07/16/2024, 11:08 PM    [facility]     after 2 AM please page floor coverage   If 7AM-7PM, please contact the day team taking care of the patient using [email or web address]

## 2024-07-16 NOTE — Assessment & Plan Note (Signed)
- -  Patient presenting with  productive cough, and infiltrate in  on chest x-ray -Infiltrate on CXR and 2-3 characteristics (fever, leukocytosis, purulent sputum) are consistent with pneumonia. -This appears to be most likely community-acquired pneumonia.         will admit for treatment of CAP will start on appropriate antibiotic coverage. - Rocephin  doxycycline    Obtain:  sputum cultures,                    influenza serologies negative                  COVID PCR negative                  blood cultures and sputum cultures ordered                   strep pneumo UA antigen,                 check for Legionella antigen.                Provide oxygen  as needed.

## 2024-07-16 NOTE — Subjective & Objective (Signed)
 Patient with known COPD came in with SOB and cough for 3 days EMS gave duoneb  Started on rocephin  and doxy  CXR showed PNA  Noted to be tachycardic and tachypnea Did not do well on BiPAP for rest  No hypercarbia Initial lactic acid 3

## 2024-07-16 NOTE — Assessment & Plan Note (Signed)
-  chronic avoid nephrotoxic medications such as NSAIDs, Vanco Zosyn combo,  avoid hypotension, continue to follow renal function

## 2024-07-16 NOTE — Assessment & Plan Note (Signed)
-  Spoke about importance of quitting spent 5 minutes discussing options for treatment, prior attempts at quitting, and dangers of smoking ? -At this point patient is    interested in quitting ? - order nicotine patch  ? - nursing tobacco cessation protocol ? ?

## 2024-07-16 NOTE — Assessment & Plan Note (Signed)
 Chronic stable continue Toprol  50 mg p.o. twice daily

## 2024-07-16 NOTE — Assessment & Plan Note (Signed)
-   will replace electrolytes and repeat  check Mg, phos and Ca level and replace as needed Monitor on telemetry   Lab Results  Component Value Date   K 3.1 (L) 07/16/2024     Lab Results  Component Value Date   CREATININE 1.22 (H) 07/16/2024   Lab Results  Component Value Date   MG 1.9 04/22/2023   Lab Results  Component Value Date   CALCIUM  9.3 07/16/2024   PHOS 4.1 03/13/2019

## 2024-07-16 NOTE — ED Triage Notes (Signed)
 Pt BIB [facility] from home. Pt c/o SHOB. Hx COPD. Pt using accessory muscles with ems. Pt tachycardic 120s-140s with ems. Lung sounds decreased. Pt had 2 duonebs with ems, 125 solumedrol. 2g of magnesium . Pt Hx of intubation a few years ago.  Pt reports smoking 15 cigarettes a day.  98% RA 160/100 120HR 18G LAC

## 2024-07-16 NOTE — ED Notes (Signed)
 RT attempted BiPAP.  Patient was unable to tolerate BiPAP.  MD was made aware

## 2024-07-16 NOTE — Assessment & Plan Note (Addendum)
 For right now continue metoprolol  50 mg p.o. twice daily would consider changing to Zebeta

## 2024-07-16 NOTE — Progress Notes (Signed)
   07/16/24 2335  BiPAP/CPAP/SIPAP  BiPAP/CPAP/SIPAP Pt Type Adult  BiPAP/CPAP/SIPAP V60  Mask Type Full face mask  Dentures removed? Not applicable  Mask Size Small  Set Rate 18 breaths/min  Respiratory Rate 28 breaths/min  IPAP 12 cmH20  EPAP 6 cmH2O  FiO2 (%) 28 %  Minute Ventilation 12.7  Leak 7  Peak Inspiratory Pressure (PIP) 13  Tidal Volume (Vt) 440  Patient Home Machine No  Patient Home Mask No  Patient Home Tubing No  Auto Titrate No  Press High Alarm 35 cmH2O  Press Low Alarm 5 cmH2O  Device Plugged into RED Power Outlet Yes

## 2024-07-16 NOTE — Sepsis Progress Note (Addendum)
 Elink following for sepsis protocol, at 23:18 gentle reminder given to [facility] for 2nd lactic

## 2024-07-16 NOTE — Assessment & Plan Note (Signed)
 Current appears to be somewhat on the dry side we will gently rehydrate follow fluid status

## 2024-07-16 NOTE — Assessment & Plan Note (Signed)
 Chronic stable continue Crestor  40 mg daily

## 2024-07-16 NOTE — Assessment & Plan Note (Signed)
-  will monitor on tele avoid QT prolonging medications, rehydrate correct electrolytes ? ?

## 2024-07-16 NOTE — Assessment & Plan Note (Signed)
-  -   Will initiate: Steroid taper  -  Antibiotics   Doxycycline , - Albuterol   PRN, - scheduled duoneb,   Titrate O2 to saturation >90%. Follow patients respiratory status.    VBG no evidence of hypercarbia  BiPAP ordered PRN for increased work of breathing.  But patient did not tolerate will hold off for right now  Currently mentating well no evidence of symptomatic hypercarbia

## 2024-07-16 NOTE — ED Provider Notes (Signed)
 [place] EMERGENCY DEPARTMENT AT [facility] Provider Note   CSN: [id number] Arrival date & time: 07/16/24  1900     Patient presents with: Shortness of Breath   Katie [name] is a 78 y.o. female.   79 year old female past medical history of COPD and tobacco abuse presents emergency department today with shortness of breath for the past few days.  Patient reports this is acutely worse today.  She came to the ER for further evaluation.  She reports a lot of chest tightness with this.  She has been coughing up a lot of sputum.  The patient denies any fevers but has had some chills.  She states that she had to be intubated for COPD a few years ago.  States that she is feeling better after some DuoNebs with medics.  She also received Solu-Medrol  and route.  She denies any leg pain or swelling.  Denies any hemoptysis.  She came to the ER today for further evaluation due to this.   Shortness of Breath Associated symptoms: cough        Prior to Admission medications   Medication Sig Start Date End Date Taking? Authorizing Provider  albuterol  (VENTOLIN  HFA) 108 (90 Base) MCG/ACT inhaler Inhale 2 puffs into the lungs every 6 (six) hours as needed for wheezing or shortness of breath. 02/08/20   Gladis Elsie BROCKS, PA-C  cephALEXin  (KEFLEX ) 500 MG capsule Take 1 capsule (500 mg total) by mouth 4 (four) times daily. 05/04/24   Logan Ubaldo NOVAK, PA-C  Doxylamine  Succinate, Sleep, (SLEEP-AID PO) Take 1 tablet by mouth daily as needed.    [provider]  famotidine  (PEPCID ) 20 MG tablet Take 1 tablet (20 mg total) by mouth 2 (two) times daily. 03/13/24   Vicky Charleston, PA-C  Fluticasone -Umeclidin-Vilant (TRELEGY ELLIPTA ) 200-62.5-25 MCG/ACT AEPB Inhale 1 puff into the lungs daily. 03/05/23   Hunsucker, Donnice SAUNDERS, MD  losartan  (COZAAR ) 50 MG tablet Take 50 mg by mouth daily.    [provider]  metoprolol  tartrate (LOPRESSOR ) 50 MG tablet Take 50 mg by mouth 2 (two)  times daily. 03/02/23   [provider]  pantoprazole  (PROTONIX ) 40 MG tablet Take 1 tablet (40 mg total) by mouth daily. 08/18/22   Zehr, Jessica D, PA-C  rosuvastatin  (CRESTOR ) 40 MG tablet Take 40 mg by mouth daily. 06/11/22   [provider]  sucralfate  (CARAFATE ) 1 g tablet Take 1 tablet (1 g total) by mouth 4 (four) times daily -  with meals and at bedtime. 03/13/24   Vicky Charleston, PA-C    Allergies: Patient has no known allergies.    Review of Systems  Respiratory:  Positive for cough and shortness of breath.   All other systems reviewed and are negative.   Updated Vital Signs BP 121/88   Pulse (!) 123   Temp 99.3 F (37.4 C) (Axillary)   Resp (!) 35   SpO2 100%   Physical Exam Vitals and nursing note reviewed.   Gen: Conversational dyspnea noted with tachypnea Eyes: PERRL, EOMI HEENT: no oropharyngeal swelling Neck: trachea midline Resp: Diffuse wheezes throughout all lung fields and diminished at bilateral lung bases Card: Tachycardic, no murmurs, rubs, or gallops Abd: nontender, nondistended Extremities: no calf tenderness, no edema Vascular: 2+ radial pulses bilaterally, 2+ DP pulses bilaterally Skin: no rashes Psyc: acting appropriately   (all labs ordered are listed, but only abnormal results are displayed) Labs Reviewed  COMPREHENSIVE METABOLIC PANEL WITH GFR - Abnormal; Notable for the following components:  Result Value   Potassium 3.1 (*)    Glucose, Bld 148 (*)    Creatinine, Ser 1.22 (*)    Total Protein 6.1 (*)    GFR, Estimated 46 (*)    All other components within normal limits  CBC WITH DIFFERENTIAL/PLATELET - Abnormal; Notable for the following components:   RBC 3.70 (*)    Hemoglobin 11.0 (*)    HCT 34.2 (*)    Neutro Abs 7.8 (*)    All other components within normal limits  BLOOD GAS, VENOUS - Abnormal; Notable for the following components:   Bicarbonate 30.5 (*)    Acid-Base Excess 3.7 (*)    All other  components within normal limits  I-STAT CG4 LACTIC ACID, ED - Abnormal; Notable for the following components:   Lactic Acid, Venous 3.0 (*)    All other components within normal limits  RESP PANEL BY RT-PCR (RSV, FLU A&B, COVID)  RVPGX2  CULTURE, BLOOD (ROUTINE X 2)  CULTURE, BLOOD (ROUTINE X 2)  EXPECTORATED SPUTUM ASSESSMENT W GRAM STAIN, RFLX TO RESP C  PROTIME-INR  LEGIONELLA PNEUMOPHILA SEROGP 1 UR AG  STREP PNEUMONIAE URINARY ANTIGEN  PROCALCITONIN  CK  MAGNESIUM   PHOSPHORUS  TSH  LACTIC ACID, PLASMA  LACTIC ACID, PLASMA  MAGNESIUM   PHOSPHORUS  COMPREHENSIVE METABOLIC PANEL WITH GFR  CBC  PRO BRAIN NATRIURETIC PEPTIDE  I-STAT CG4 LACTIC ACID, ED  TROPONIN T, HIGH SENSITIVITY    EKG: EKG Interpretation Date/Time:  Saturday July 16 2024 19:13:39 EDT Ventricular Rate:  113 PR Interval:  148 QRS Duration:  95 QT Interval:  371 QTC Calculation: 509 R Axis:   -13  Text Interpretation: Sinus tachycardia Abnormal R-wave progression, early transition Nonspecific ST abnormality Prolonged QT interval Confirmed by Ula Barter [phone]) on 07/16/2024 7:43:51 PM  Radiology: [facility] Chest Port 1 View Result Date: 07/16/2024 EXAM: 1 VIEW(S) XRAY OF THE CHEST 07/16/2024 08:08:00 PM COMPARISON: 06/01/2024 CLINICAL HISTORY: Questionable sepsis - evaluate for abnormality. Questionable sepsis - evaluate for abnormality; SOB FINDINGS: LUNGS AND PLEURA: Hazy opacity in the right mid and lower lung. Mild chronic interstitial lung markings. No pulmonary edema. No pleural effusion. No pneumothorax. HEART AND MEDIASTINUM: Atherosclerotic plaque noted. No acute abnormality of the cardiac and mediastinal silhouettes. BONES AND SOFT TISSUES: No acute osseous abnormality. IMPRESSION: 1. Hazy opacities in the right mid and lower lung suspicious for pneumonia. Follow up in 8-10 weeks is recommended to ensure resolution. Electronically signed by: Norman Gatlin MD 07/16/2024 08:14 PM EDT RP Workstation:  [id number]     Procedures   Medications Ordered in the ED  doxycycline  (VIBRAMYCIN ) 100 mg in sodium chloride  0.9 % 250 mL IVPB (100 mg Intravenous New Bag/Given 07/16/24 2128)  cefTRIAXone  (ROCEPHIN ) 2 g in sodium chloride  0.9 % 100 mL IVPB (has no administration in time range)  doxycycline  (VIBRAMYCIN ) 100 mg in sodium chloride  0.9 % 250 mL IVPB (has no administration in time range)  potassium chloride  10 mEq in 100 mL IVPB (has no administration in time range)  rosuvastatin  (CRESTOR ) tablet 40 mg (has no administration in time range)  pantoprazole  (PROTONIX ) EC tablet 40 mg (has no administration in time range)  metoprolol  tartrate (LOPRESSOR ) tablet 50 mg (has no administration in time range)  sucralfate  (CARAFATE ) tablet 1 g (has no administration in time range)  famotidine  (PEPCID ) tablet 20 mg (has no administration in time range)  acetaminophen  (TYLENOL ) tablet 650 mg (has no administration in time range)    Or  acetaminophen  (TYLENOL ) suppository 650 mg (  has no administration in time range)  HYDROcodone -acetaminophen  (NORCO/VICODIN) 5-325 MG per tablet 1-2 tablet (has no administration in time range)  fentaNYL  (SUBLIMAZE ) injection 12.5-50 mcg (has no administration in time range)  albuterol  (PROVENTIL ) (2.5 MG/3ML) 0.083% nebulizer solution 2.5 mg (has no administration in time range)  magnesium  sulfate IVPB 2 g 50 mL (has no administration in time range)  methylPREDNISolone  sodium succinate (SOLU-MEDROL ) 125 mg/2 mL injection 125 mg (has no administration in time range)  ipratropium-albuterol  (DUONEB) 0.5-2.5 (3) MG/3ML nebulizer solution 3 mL (3 mLs Nebulization Given 07/16/24 1957)  cefTRIAXone  (ROCEPHIN ) 2 g in sodium chloride  0.9 % 100 mL IVPB (0 g Intravenous Stopped 07/16/24 2125)  lactated ringers  bolus 1,000 mL (1,000 mLs Intravenous New Bag/Given 07/16/24 2125)                                    Medical Decision Making 78 year old female with past medical history of  tobacco abuse and COPD presenting to the emergency department today with shortness of breath.  Patient does have diffuse wheezes here on exam.  She is still tachypneic here.  I will place patient on BiPAP here for work of breathing.  Will initiate a sepsis workup as the patient is tachycardic here and cover her with Rocephin  and doxycycline  as she does have history of QT prolongation.  She has already received Solu-Medrol  with medics for her underlying COPD.  Will also obtain an RSV/COVID/flu swab.  I will reevaluate for ultimate disposition.  The patient's labs are revealing for elevated lactic acid which may be due to sepsis or her increased work of breathing.  Chest x-ray shows findings concerning for pneumonia.  Patient is febrile with antibiotics.  I did trial the patient on BiPAP here but she did not tolerate this.  She was given additional DuoNebs with improvement in her symptoms.  She is still tachycardic and symptomatic so calls placed a hospital service for admission.  Amount and/or Complexity of Data Reviewed Labs: ordered. Radiology: ordered.  Risk Prescription drug management. Decision regarding hospitalization.        Final diagnoses:  Pneumonia due to infectious organism, unspecified laterality, unspecified part of lung  COPD exacerbation [facility])    ED Discharge Orders     None          Ula Prentice SAUNDERS, MD 07/16/24 2241

## 2024-07-17 ENCOUNTER — Other Ambulatory Visit: Payer: Self-pay

## 2024-07-17 ENCOUNTER — Inpatient Hospital Stay (HOSPITAL_COMMUNITY): Payer: Medicare (Managed Care)

## 2024-07-17 ENCOUNTER — Encounter (HOSPITAL_COMMUNITY): Payer: Self-pay | Admitting: Internal Medicine

## 2024-07-17 DIAGNOSIS — I5031 Acute diastolic (congestive) heart failure: Secondary | ICD-10-CM

## 2024-07-17 DIAGNOSIS — I5033 Acute on chronic diastolic (congestive) heart failure: Secondary | ICD-10-CM

## 2024-07-17 DIAGNOSIS — J9601 Acute respiratory failure with hypoxia: Secondary | ICD-10-CM | POA: Diagnosis not present

## 2024-07-17 DIAGNOSIS — I2510 Atherosclerotic heart disease of native coronary artery without angina pectoris: Secondary | ICD-10-CM

## 2024-07-17 DIAGNOSIS — I059 Rheumatic mitral valve disease, unspecified: Secondary | ICD-10-CM

## 2024-07-17 DIAGNOSIS — J189 Pneumonia, unspecified organism: Secondary | ICD-10-CM | POA: Diagnosis not present

## 2024-07-17 DIAGNOSIS — E7800 Pure hypercholesterolemia, unspecified: Secondary | ICD-10-CM

## 2024-07-17 DIAGNOSIS — D649 Anemia, unspecified: Secondary | ICD-10-CM | POA: Insufficient documentation

## 2024-07-17 DIAGNOSIS — F1910 Other psychoactive substance abuse, uncomplicated: Secondary | ICD-10-CM

## 2024-07-17 DIAGNOSIS — N179 Acute kidney failure, unspecified: Secondary | ICD-10-CM

## 2024-07-17 DIAGNOSIS — J441 Chronic obstructive pulmonary disease with (acute) exacerbation: Secondary | ICD-10-CM | POA: Diagnosis not present

## 2024-07-17 DIAGNOSIS — I5032 Chronic diastolic (congestive) heart failure: Secondary | ICD-10-CM | POA: Diagnosis not present

## 2024-07-17 DIAGNOSIS — R7989 Other specified abnormal findings of blood chemistry: Secondary | ICD-10-CM

## 2024-07-17 DIAGNOSIS — I4891 Unspecified atrial fibrillation: Secondary | ICD-10-CM

## 2024-07-17 DIAGNOSIS — I10 Essential (primary) hypertension: Secondary | ICD-10-CM

## 2024-07-17 LAB — COMPREHENSIVE METABOLIC PANEL WITH GFR
ALT: 11 U/L (ref 0–44)
AST: 24 U/L (ref 15–41)
Albumin: 3.5 g/dL (ref 3.5–5.0)
Alkaline Phosphatase: 71 U/L (ref 38–126)
Anion gap: 17 — ABNORMAL HIGH (ref 5–15)
BUN: 15 mg/dL (ref 8–23)
CO2: 20 mmol/L — ABNORMAL LOW (ref 22–32)
Calcium: 9.6 mg/dL (ref 8.9–10.3)
Chloride: 102 mmol/L (ref 98–111)
Creatinine, Ser: 1.24 mg/dL — ABNORMAL HIGH (ref 0.44–1.00)
GFR, Estimated: 45 mL/min — ABNORMAL LOW (ref >60)
Glucose, Bld: 166 mg/dL — ABNORMAL HIGH (ref 70–99)
Potassium: 3 mmol/L — ABNORMAL LOW (ref 3.5–5.1)
Sodium: 139 mmol/L (ref 135–145)
Total Bilirubin: 0.2 mg/dL (ref 0.0–1.2)
Total Protein: 6.3 g/dL — ABNORMAL LOW (ref 6.5–8.1)

## 2024-07-17 LAB — RESPIRATORY PANEL BY PCR

## 2024-07-17 LAB — PRO BRAIN NATRIURETIC PEPTIDE: Pro Brain Natriuretic Peptide: 7015 pg/mL — ABNORMAL HIGH (ref <300.0)

## 2024-07-17 LAB — URINE DRUG SCREEN
Amphetamines: NEGATIVE
Barbiturates: NEGATIVE
Benzodiazepines: NEGATIVE
Cocaine: POSITIVE — AB
Fentanyl: NEGATIVE
Methadone Scn, Ur: NEGATIVE
Opiates: NEGATIVE
Tetrahydrocannabinol: NEGATIVE

## 2024-07-17 LAB — BLOOD GAS, ARTERIAL
Acid-Base Excess: 1.3 mmol/L (ref 0.0–2.0)
Bicarbonate: 25.9 mmol/L (ref 20.0–28.0)
Delivery systems: POSITIVE
Drawn by: 20012
FIO2: 28 %
Mode: POSITIVE
O2 Content: 28 L/min
O2 Saturation: 100 %
PEEP: 6 cmH2O
Patient temperature: 36.4
RATE: 18 {breaths}/min
pCO2 arterial: 39 mmHg (ref 32–48)
pH, Arterial: 7.43 (ref 7.35–7.45)
pO2, Arterial: 138 mmHg — ABNORMAL HIGH (ref 83–108)

## 2024-07-17 LAB — T4, FREE: Free T4: 1.34 ng/dL — ABNORMAL HIGH (ref 0.61–1.12)

## 2024-07-17 LAB — TROPONIN T, HIGH SENSITIVITY
Troponin T High Sensitivity: 19 ng/L (ref 0–19)
Troponin T High Sensitivity: 19 ng/L (ref 0–19)

## 2024-07-17 LAB — PHOSPHORUS
Phosphorus: 3 mg/dL (ref 2.5–4.6)
Phosphorus: 2.7 mg/dL (ref 2.5–4.6)

## 2024-07-17 LAB — LACTIC ACID, PLASMA
Lactic Acid, Venous: 3.8 mmol/L (ref 0.5–1.9)
Lactic Acid, Venous: 5.1 mmol/L (ref 0.5–1.9)
Lactic Acid, Venous: 5 mmol/L (ref 0.5–1.9)
Lactic Acid, Venous: 2.8 mmol/L (ref 0.5–1.9)
Lactic Acid, Venous: 2.3 mmol/L (ref 0.5–1.9)

## 2024-07-17 LAB — PROCALCITONIN: Procalcitonin: <0.1 ng/mL

## 2024-07-17 LAB — FERRITIN: Ferritin: 97 ng/mL (ref 11–307)

## 2024-07-17 LAB — ECHOCARDIOGRAM COMPLETE
AR max vel: 3.27 cm2
AV Area VTI: 3.67 cm2
AV Area mean vel: 3.34 cm2
AV Mean grad: 9 mmHg
AV Peak grad: 16.2 mmHg
Ao pk vel: 2.01 m/s
Est EF: >75
MV VTI: 2.63 cm2
S' Lateral: 2.2 cm

## 2024-07-17 LAB — RETICULOCYTES
Immature Retic Fract: 14.4 % (ref 2.3–15.9)
RBC.: 3.7 MIL/uL — ABNORMAL LOW (ref 3.87–5.11)
Retic Count, Absolute: 48.5 K/uL (ref 19.0–186.0)
Retic Ct Pct: 1.3 % (ref 0.4–3.1)

## 2024-07-17 LAB — CBC
HCT: 30.7 % — ABNORMAL LOW (ref 36.0–46.0)
Hemoglobin: 9.3 g/dL — ABNORMAL LOW (ref 12.0–15.0)
MCH: 28.4 pg (ref 26.0–34.0)
MCHC: 30.3 g/dL (ref 30.0–36.0)
MCV: 93.9 fL (ref 80.0–100.0)
Platelets: 134 K/uL — ABNORMAL LOW (ref 150–400)
RBC: 3.27 MIL/uL — ABNORMAL LOW (ref 3.87–5.11)
RDW: 12.8 % (ref 11.5–15.5)
WBC: 4.9 K/uL (ref 4.0–10.5)
nRBC: 0 % (ref 0.0–0.2)

## 2024-07-17 LAB — IRON AND TIBC
Iron: 17 ug/dL — ABNORMAL LOW (ref 28–170)
Saturation Ratios: 5 % — ABNORMAL LOW (ref 10.4–31.8)
TIBC: 336 ug/dL (ref 250–450)
UIBC: 319 ug/dL

## 2024-07-17 LAB — MAGNESIUM
Magnesium: 3.3 mg/dL — ABNORMAL HIGH (ref 1.7–2.4)
Magnesium: 3 mg/dL — ABNORMAL HIGH (ref 1.7–2.4)

## 2024-07-17 LAB — TSH: TSH: 0.284 u[IU]/mL — ABNORMAL LOW (ref 0.350–4.500)

## 2024-07-17 LAB — D-DIMER, QUANTITATIVE: D-Dimer, Quant: 1.43 ug{FEU}/mL — ABNORMAL HIGH (ref 0.00–0.50)

## 2024-07-17 LAB — FOLATE: Folate: 9 ng/mL (ref >5.9)

## 2024-07-17 LAB — STREP PNEUMONIAE URINARY ANTIGEN: Strep Pneumo Urinary Antigen: NEGATIVE

## 2024-07-17 LAB — CK: Total CK: 62 U/L (ref 38–234)

## 2024-07-17 LAB — VITAMIN B12: Vitamin B-12: 462 pg/mL (ref 180–914)

## 2024-07-17 MED ORDER — LOSARTAN POTASSIUM 50 MG PO TABS
50.0000 mg | ORAL_TABLET | Freq: Every day | ORAL | Status: DC
Start: 2024-07-17 — End: 2024-07-23
  Administered 2024-07-17 – 2024-07-22 (×6): 50 mg via ORAL
  Filled 2024-07-17 (×6): qty 1

## 2024-07-17 MED ORDER — IOHEXOL 350 MG/ML SOLN
70.0000 mL | Freq: Once | INTRAVENOUS | Status: AC | PRN
Start: 2024-07-17 — End: 2024-07-17
  Administered 2024-07-17: 60 mL via INTRAVENOUS

## 2024-07-17 MED ORDER — FUROSEMIDE 10 MG/ML IJ SOLN
40.0000 mg | Freq: Once | INTRAMUSCULAR | Status: AC
  Administered 2024-07-17: 40 mg via INTRAVENOUS
  Filled 2024-07-17: qty 4

## 2024-07-17 MED ORDER — BUDESONIDE 0.25 MG/2ML IN SUSP
0.2500 mg | Freq: Two times a day (BID) | RESPIRATORY_TRACT | Status: DC
  Administered 2024-07-17 – 2024-07-25 (×16): 0.25 mg via RESPIRATORY_TRACT
  Filled 2024-07-17 (×16): qty 2

## 2024-07-17 MED ORDER — ARFORMOTEROL TARTRATE 15 MCG/2ML IN NEBU
15.0000 ug | INHALATION_SOLUTION | Freq: Two times a day (BID) | RESPIRATORY_TRACT | Status: DC
  Administered 2024-07-17 – 2024-07-25 (×16): 15 ug via RESPIRATORY_TRACT
  Filled 2024-07-17 (×16): qty 2

## 2024-07-17 MED ORDER — POTASSIUM CHLORIDE 10 MEQ/100ML IV SOLN: 10.0000 meq | INTRAVENOUS | Status: AC

## 2024-07-17 MED ORDER — INFLUENZA VAC SPLIT HIGH-DOSE 0.5 ML IM SUSY
0.5000 mL | PREFILLED_SYRINGE | INTRAMUSCULAR | Status: AC
  Administered 2024-07-18: 0.5 mL via INTRAMUSCULAR
  Filled 2024-07-17: qty 0.5

## 2024-07-17 MED ORDER — METHYLPREDNISOLONE SODIUM SUCC 40 MG IJ SOLR: 40.0000 mg | Freq: Two times a day (BID) | INTRAMUSCULAR | Status: DC

## 2024-07-17 MED ORDER — METHYLPREDNISOLONE SODIUM SUCC 40 MG IJ SOLR
40.0000 mg | Freq: Two times a day (BID) | INTRAMUSCULAR | Status: DC
  Administered 2024-07-17 – 2024-07-24 (×16): 40 mg via INTRAVENOUS
  Filled 2024-07-17 (×17): qty 1

## 2024-07-17 MED ORDER — HYDRALAZINE HCL 20 MG/ML IJ SOLN
10.0000 mg | Freq: Four times a day (QID) | INTRAMUSCULAR | Status: DC | PRN
  Administered 2024-07-19: 10 mg via INTRAVENOUS
  Filled 2024-07-17: qty 1

## 2024-07-17 NOTE — ED Notes (Addendum)
 Date and time results received: 07/17/24 4:37 AM  (use smartphrase .now to insert current time)  Test: Lactic acid Critical Value: 5.1  Name of Provider Notified: Jesus   Orders Received? Or Actions Taken?: MAR

## 2024-07-17 NOTE — Progress Notes (Signed)
 [facility]                                                                              Katie [name], is a 78 y.o. female, DOB - [birth year], FMW:[id number] Admit date - 07/16/2024    Outpatient Primary MD for the patient is [name], MD  LOS - 1  days  Chief Complaint  Patient presents with   Shortness of Breath       Brief summary   Patient is a 78 year old female with history of COPD, tobacco abuse, cocaine use, diastolic CHF, hypertension, hyperlipidemia, CKD stage IIIa, CAD, history of severe MR status post mitral valve repair in 04/2019, AAA status post EVAR 01/2022 presented with shortness of breath, respiratory dress, cough, increased wheezing for last 3 days.  Patient reported acute worsening at the time of admission with a chest tightness.  Patient was found to be tachypneic, tachycardiac, respiratory distress, lactic acidosis, placed on BiPAP in ED Labs showed creatinine 1.22, K 3.1, LFTs normal, BNP 7015, troponin 19, CK 62 Procalcitonin less than 0.1,  Hemoglobin 11.0, was 14.09/20  TSH 0.2 Chest x-ray showed hazy opacities in the right middle and lower lung suspicious for pneumonia  Assessment & Plan    Principal Problem:   Acute respiratory failure with hypoxia (HCC) -Multifactorial likely due to CAP pneumonia, COPD exacerbation, acute on chronic diastolic CHF - Currently on BiPAP during exam, wean as tolerated - Received Lasix  40 mg IV x 1 in ED - ABG done this morning, no respiratory acidosis.  Follow D-dimer, if elevated will need CTA to rule out PE  Active Problems:   COPD exacerbation (HCC),    CAP (community acquired pneumonia) - Wean O2 as tolerated, continue scheduled DuoNebs, Pulmicort , Brovana, IV Solu-Medrol  - Continue IV Rocephin , doxycycline  - Order D-dimer  Acute on chronic diastolic CHF, history of CAD, severe MR s/p mitral valve repair in 04/2019 - BNP 7015 received Lasix  40 mg IV x 1 - Continue BiPAP, wean as  tolerated - Repeat 2D echo (previous 2D echo in 2023 had shown EF of 65 to 70%, RV SF mildly reduced, indeterminate diastolic parameters, left atrial size moderately dilated, small pericardial effusion, prostatic annuloplasty ring, moderate mitral stenosis) - Cardiology consulted - Obtain UDS, prior history of cocaine abuse   Lactic acidosis, anion gap metabolic acidosis -Unclear etiology, ABG did not show any respiratory acidosis, no GI losses, not on any medications causing acidosis.  Does not appear to be from sepsis.  - Will check comprehensive UDS. -Recheck lactate level, CK 62,  procalcitonin less than 0.1,  Normocytic anemia - Hemoglobin 11.0 on admission, trended down to 9.3 this morning, no reports of active GI bleeding - Obtain FOBT, anemia panel  Essential hypertension - Beta-blocker discontinued due to prior history of cocaine use    AAA (abdominal aortic aneurysm) without rupture - Continue BP control    HLD (hyperlipidemia) -Continue Crestor     Tobacco abuse disorder -Continue nicotine  patch    CKD stage G3a/A1, GFR 45-59 and albumin  creatinine ratio <30 mg/g (HCC) -Creatinine stable at 1.2, at baseline     QT prolongation -Replace  K magnesium  3.0, avoid QT prolonging meds    Hypokalemia -Replaced   Estimated body mass index is 26.45 kg/m as calculated from the following:   Height as of 05/04/24: 5' 1 (1.549 m).   Weight as of 06/01/24: 63.5 kg.  Code Status: DNR DVT Prophylaxis:  SCDs Start: 07/16/24 2149   Level of Care: Level of care: Progressive Family Communication: Updated patient Disposition Plan:      Remains inpatient appropriate:      Procedures:    Consultants:   Cardiology  Antimicrobials:   Anti-infectives (From admission, onward)    Start     Dose/Rate Route Frequency Ordered Stop   07/17/24 2000  cefTRIAXone  (ROCEPHIN ) 2 g in sodium chloride  0.9 % 100 mL IVPB        2 g 200 mL/hr over 30 Minutes Intravenous Every 24 hours  07/16/24 2128 07/22/24 1959   07/17/24 1000  doxycycline  (VIBRAMYCIN ) 100 mg in sodium chloride  0.9 % 250 mL IVPB        100 mg 125 mL/hr over 120 Minutes Intravenous Every 12 hours 07/16/24 2128     07/16/24 1945  cefTRIAXone  (ROCEPHIN ) 2 g in sodium chloride  0.9 % 100 mL IVPB        2 g 200 mL/hr over 30 Minutes Intravenous Once 07/16/24 1933 07/16/24 2125   07/16/24 1945  doxycycline  (VIBRAMYCIN ) 100 mg in sodium chloride  0.9 % 250 mL IVPB        100 mg 125 mL/hr over 120 Minutes Intravenous  Once 07/16/24 1933 07/16/24 2339          Medications  arformoterol  15 mcg Nebulization BID   budesonide  (PULMICORT ) nebulizer solution  0.25 mg Nebulization BID   famotidine   20 mg Oral BID   ipratropium-albuterol   3 mL Nebulization QID   methylPREDNISolone  (SOLU-MEDROL ) injection  40 mg Intravenous Q12H   nicotine   14 mg Transdermal Daily   pantoprazole   40 mg Oral Daily   rosuvastatin   40 mg Oral Daily   sodium chloride  flush  3 mL Intravenous Q12H   sucralfate   1 g Oral TID WC & HS      Subjective:   Katie [name] was seen and examined today.  Seen in ED this morning, on BiPAP. Still wheezing, sob, no acute chest pain, abdominal pain. No acute events overnight.    Objective:   Vitals:   07/17/24 0732 07/17/24 0734 07/17/24 0736 07/17/24 0840  BP:      Pulse:   (!) 101 (!) 102  Resp:   (!) 29 (!) 24  Temp:      TempSrc:      SpO2: 100% 100% 100% 100%    Intake/Output Summary (Last 24 hours) at 07/17/2024 0957 Last data filed at 07/17/2024 9357 Gross per 24 hour  Intake 923.33 ml  Output --  Net 923.33 ml     Wt Readings from Last 3 Encounters:  06/01/24 63.5 kg  05/04/24 63.5 kg  03/12/24 63.5 kg     Exam General: Alert and oriented x3, on Bipap  Cardiovascular: S1 S2 auscultated,  RRR Respiratory: decreased BS whith crackles and wheezing  Gastrointestinal: Soft, nontender, nondistended, + bowel sounds Ext: no pedal edema bilaterally Neuro: no new  FND's Psych: Normal affect     Data Reviewed:  I have personally reviewed following labs    CBC Lab Results  Component Value Date   WBC 4.9 07/17/2024   RBC 3.27 (L) 07/17/2024   HGB 9.3 (L) 07/17/2024  HCT 30.7 (L) 07/17/2024   MCV 93.9 07/17/2024   MCH 28.4 07/17/2024   PLT 134 (L) 07/17/2024   MCHC 30.3 07/17/2024   RDW 12.8 07/17/2024   LYMPHSABS 1.2 07/16/2024   MONOABS 0.7 07/16/2024   EOSABS 0.1 07/16/2024   BASOSABS 0.0 07/16/2024     Last metabolic panel Lab Results  Component Value Date   NA 139 07/17/2024   K 3.0 (L) 07/17/2024   CL 102 07/17/2024   CO2 20 (L) 07/17/2024   BUN 15 07/17/2024   CREATININE 1.24 (H) 07/17/2024   GLUCOSE 166 (H) 07/17/2024   GFRNONAA 45 (L) 07/17/2024   GFRAA 37 (L) 04/26/2020   CALCIUM  9.6 07/17/2024   PHOS 2.7 07/17/2024   PROT 6.3 (L) 07/17/2024   ALBUMIN  3.5 07/17/2024   BILITOT 0.2 07/17/2024   ALKPHOS 71 07/17/2024   AST 24 07/17/2024   ALT 11 07/17/2024   ANIONGAP 17 (H) 07/17/2024    CBG (last 3)  No results for input(s): GLUCAP in the last 72 hours.    Coagulation Profile: Recent Labs  Lab 07/16/24 1940  INR 1.0     Radiology Studies: I have personally reviewed the imaging studies  DG Chest Port 1 View Result Date: 07/16/2024 EXAM: 1 VIEW(S) XRAY OF THE CHEST 07/16/2024 08:08:00 PM COMPARISON: 06/01/2024 CLINICAL HISTORY: Questionable sepsis - evaluate for abnormality. Questionable sepsis - evaluate for abnormality; SOB FINDINGS: LUNGS AND PLEURA: Hazy opacity in the right mid and lower lung. Mild chronic interstitial lung markings. No pulmonary edema. No pleural effusion. No pneumothorax. HEART AND MEDIASTINUM: Atherosclerotic plaque noted. No acute abnormality of the cardiac and mediastinal silhouettes. BONES AND SOFT TISSUES: No acute osseous abnormality. IMPRESSION: 1. Hazy opacities in the right mid and lower lung suspicious for pneumonia. Follow up in 8-10 weeks is recommended to ensure  resolution. Electronically signed by: Norman Gatlin MD 07/16/2024 08:14 PM EDT RP Workstation: [id number]       Katie [name] M.D. [facility] 07/17/2024, 9:57 AM  Available via Epic secure chat 7am-7pm After 7 pm, please refer to night coverage provider listed on amion.

## 2024-07-17 NOTE — Progress Notes (Signed)
   07/16/24 2335  BiPAP/CPAP/SIPAP  $ Non-Invasive Ventilator  Non-Invasive Vent Initial  BiPAP/CPAP/SIPAP Pt Type Adult  BiPAP/CPAP/SIPAP V60  Mask Type Full face mask  Dentures removed? Not applicable  Mask Size Small  Set Rate 18 breaths/min  Respiratory Rate 28 breaths/min  IPAP 12 cmH20  EPAP 6 cmH2O  FiO2 (%) 28 %  Minute Ventilation 12.7  Leak 7  Peak Inspiratory Pressure (PIP) 13  Tidal Volume (Vt) 440  Patient Home Machine No  Patient Home Mask No  Patient Home Tubing No  Auto Titrate No  Press High Alarm 35 cmH2O  Press Low Alarm 5 cmH2O  Device Plugged into RED Power Outlet Yes  BiPAP/CPAP /SiPAP Vitals  Pulse Rate (!) 132  Resp (!) 31  SpO2 100 %  MEWS Score/Color  MEWS Score 5  MEWS Score Color Red

## 2024-07-17 NOTE — Significant Event (Signed)
       CROSS COVER NOTE  NAME: Katie [name] MRN: [id number] DOB : 01/11/1946 ATTENDING PHYSICIAN: Davia Nydia POUR, MD    Date of Service   07/17/2024   HPI/Events of Note   Notified of elevated lactic acid levels Patient admitted with respiratory distress with increasing oxygen  needs.  BNP over 7000 White count normal procal normal Lactic acidosis likely metabolic or med related BP stable and afebrile  Interventions   Assessment/Plan: Continue to monitor        Erminio LITTIE Cone NP [facility] Cross Cover 7pm-7am - check amion for availability Pager [phone]

## 2024-07-17 NOTE — ED Notes (Signed)
 Pt is requesting anxiety medication, Dr. Jesus notified

## 2024-07-17 NOTE — ED Notes (Signed)
 Pt is eating breakfast and then I will collect lab work

## 2024-07-17 NOTE — Progress Notes (Signed)
   07/17/24 0840  Oxygen  Therapy/Pulse Ox  O2 Device Nasal Cannula  O2 Therapy Oxygen   O2 Flow Rate (L/min) 2 L/min  FiO2 (%) 28 %  SpO2 100 %  Safety Instructions Yes (Comment)   BIPAP on standby.

## 2024-07-17 NOTE — Progress Notes (Signed)
   07/17/24 2057  BiPAP/CPAP/SIPAP  BiPAP/CPAP/SIPAP Pt Type Adult  BiPAP/CPAP/SIPAP V60  Mask Type Full face mask  Dentures removed? Not applicable  Mask Size Small  Set Rate 18 breaths/min  Respiratory Rate 24 breaths/min  IPAP 12 cmH20  EPAP 6 cmH2O  FiO2 (%) 28 %  Minute Ventilation 9  Leak 11  Peak Inspiratory Pressure (PIP) 12  Tidal Volume (Vt) 383  Patient Home Machine No  Patient Home Mask No  Patient Home Tubing No  Auto Titrate No  Press High Alarm 35 cmH2O  Press Low Alarm 5 cmH2O  CPAP/SIPAP surface wiped down Yes  Device Plugged into RED Power Outlet Yes

## 2024-07-17 NOTE — Plan of Care (Signed)

## 2024-07-17 NOTE — Consult Note (Addendum)
 Cardiology Consultation   Patient ID: Katie [name] MRN: [id number]; DOB: 10-16-1946  Admit date: 07/16/2024 Date of Consult: 07/17/2024  PCP:  Katie Leni Edyth DELENA, MD   [facility] Providers Cardiologist:  Annabella Scarce, MD        Patient Profile: Katie [name] is a 77 y.o. female with a hx of  MVP and severe MR s/p minimally invasive MV repair 2020, nonobstructive CAD, tobacco, cocaine use, chronic HFpEF, carotid artery disease, HTN, HLD, COPD, renal cell carcinoma s/p right nephrectomy 2011, CKD III, brief NSVT/PSVT, tobacco use, AAA s/p stent graft 2023, who is being seen 07/17/2024 for the evaluation of CHF at the request of Dr. Davia.  History of Present Illness:  [name] has known MV disease and underwent pre-op cath 03/2019 with minimal nonobstructive CAD (25% pLAD, 25% p-mCx). She underwent complex valvuloplasty and MV repair in 04/2019. Post-op course was notable for post-op atrial fibrillation requiring amiodarone  and Coumadin  therapy. Amiodarone  was discontinued in follow-up as she was maintaining normal rhythm. It is not clear at what point Coumadin  was stopped as she was then lost to follow-up after surgery until another office evaluation in 2022 for syncope. Monitor showed NSR, avg 73bpm, range 46-267bpm with 2 NSVT (longest 5 beats), 10 PSVT (longest 12.4 sec), rare PACs/PVCs. She was again lost to follow-up until seen for pre-op evaluation in 2023 at the request of vascular surgery for AAA stent graft in 2023. Echo showed EF 65-70%, LVOT peak gradient , mildly reduced RV function, moderate LAE, small pericardial effusion, s/p MV repair with moderate mitral stenosis. She underwent AAA surgery without acute complication. She was last admitted to the hospital 02/2024 for AKI in setting of GI illness and ongoing gocaine use.  She was recently seen in the ED 07/09/24 for n/v/abd pain but LWBS. She presented back to the [facility] by EMS with worsening SOB associated  with chest tightness. She had been more SOB for several days then felt acutely worse. She also was coughing up sputum. + Chills, no fever. She was noted to be using accessory muscles by EMS and tachycardic 120-140s so received 2 Duonebs, 125mg  Solu-Medrol , 2mg  magnesium . CXR with hazy RML/RLL opacities concerning for PNA. Labs show new progressive anemia (11->9.3 down from very recent baseline of 14-16), Cr 1.22, K 3.1,  Covid/flu/RSV neg, resp panel panding, uptrending lactic acid from 3->5.1, neg troponin x2, pBNP 7015, abnormal TSH at 0.284. She received multiple interventions in ER including BiPAP, antibiotics, LR IVF,  IV magnesium , IV potassium, IV steroids, nebulizers, and 40mg  IV Lasix  x 1. 2d echo pending. EKG shows sinus tachycardia without acute changes. Home med rec is pending.  Ultimately diagnosed with CAP pneumonia and COPD exacerbation complicated by acute on chronic diastolic CHF.  Cardiology was asked to consult for evaluation of chest pain and acute CHF.  Patient tells me that she has had worsening shortness of breath for 3 to 4 days along with chest pain.  She says the chest pain will go across her left chest and up into her shoulder.  This is associated with some diaphoresis and shortness of breath.  It can come and go lasting a few minutes at a time.  She tells me that recently she has been eating out a lot and not watching her salt intake.  Unclear if she has been compliant with her medications or not.  She does admit to using cocaine several days ago.  Denies any dizziness, palpitations, syncope, PND orthopnea.  She initially required  BiPAP on admission but has been weaned.  She is able to lie flat now in bed without any shortness of breath.  Currently getting DuoNebs, Pulmicort , Brovana and IV Solu-Medrol  as well as IV antibiotics.  D-dimer elevated at 1.43.  Chest CT angio done today shows no evidence of PE with small bilateral pleural effusions, emphysema.  There is no mention of  coronary calcifications.  Past Medical History:  Diagnosis Date   AAA (abdominal aortic aneurysm)    AKI (acute kidney injury)    Arthritis    Chest pain 04/17/2016   Cholelithiasis 2011   s/p cholescystectomy    Chronic diastolic congestive heart failure (HCC)    COPD (chronic obstructive pulmonary disease) (HCC)    GERD (gastroesophageal reflux disease)    History of renal cell carcinoma    Hypercholesteremia    Hypertension    Hypertension    Mitral regurgitation    Nausea and vomiting 06/19/2022   Pneumonia 1980's   Renal cell carcinoma 01/2010   s/p right radical nephrectomy 01/2010,  followed by [facility]   S/P minimally invasive mitral valve repair 04/20/2019   Complex valvuloplasty including triangular resection of flail segment of posterior leaflet, artificial Gore-tex neochord placement x6 and 30 mm Sorin Memo 4D ring annuloplasty via right mini thoracotomy approach   Shortness of breath    Tuberculosis    Tests positive for PPD. dad had h/o TB.    Past Surgical History:  Procedure Laterality Date   ABDOMINAL AORTIC ENDOVASCULAR STENT GRAFT N/A 01/10/2022   Procedure: ABDOMINAL AORTIC ENDOVASCULAR STENT GRAFT;  Surgeon: Gretta Lonni PARAS, MD;  Location: [facility] OR;  Service: Vascular;  Laterality: N/A;   APPENDECTOMY  1970's   BUBBLE STUDY  03/18/2019   Procedure: BUBBLE STUDY;  Surgeon: Okey Vina GAILS, MD;  Location: [facility] ENDOSCOPY;  Service: Cardiovascular;;   CARDIAC CATHETERIZATION  09/2011   CHOLECYSTECTOMY  01/2010   DIAGNOSTIC LAPAROSCOPY     KIDNEY SURGERY     LEFT AND RIGHT HEART CATHETERIZATION WITH CORONARY ANGIOGRAM N/A 09/30/2011   Procedure: LEFT AND RIGHT HEART CATHETERIZATION WITH CORONARY ANGIOGRAM;  Surgeon: Oneil Parchment, MD;  Location: [facility] CATH LAB;  Service: Cardiovascular;  Laterality: N/A;   MITRAL VALVE REPAIR Right 04/20/2019   Procedure: MINIMALLY INVASIVE MITRAL VALVE REPAIR (MVR) USING MEMO 4D SIZE 30;  Surgeon: Dusty Sudie DEL, MD;   Location: [facility] OR;  Service: Open Heart Surgery;  Laterality: Right;   MULTIPLE EXTRACTIONS WITH ALVEOLOPLASTY  10/06/2011   Procedure: MULTIPLE EXTRACION WITH ALVEOLOPLASTY;  Surgeon: Tanda JULIANNA Fanny, DDS;  Location: [facility] OR;  Service: Oral Surgery;  Laterality: N/A;  Multiple extraction of tooth #'s 1, 6, 8, 10, 11, 22, 23, 26, 27, 28, 29 with alveoloplasty and Upper right buccal exostoses reductions.   NEPHRECTOMY RADICAL  01/2010   right    ORIF WRIST FRACTURE Right 02/17/2022   Procedure: OPEN REDUCTION INTERNAL FIXATION (ORIF) WRIST FRACTURE;  Surgeon: Shari Easter, MD;  Location: [facility] OR;  Service: Orthopedics;  Laterality: Right;  with IV sedation   OVARIAN CYST REMOVAL  1970's   went through belly button   RIGHT/LEFT HEART CATH AND CORONARY ANGIOGRAPHY N/A 03/22/2019   Procedure: RIGHT/LEFT HEART CATH AND CORONARY ANGIOGRAPHY;  Surgeon: Dann Candyce RAMAN, MD;  Location: [facility] INVASIVE CV LAB;  Service: Cardiovascular;  Laterality: N/A;   TEE WITHOUT CARDIOVERSION  10/01/2011   Procedure: TRANSESOPHAGEAL ECHOCARDIOGRAM (TEE);  Surgeon: Candyce CANDIE Dann;  Location: [facility] ENDOSCOPY;  Service: Cardiovascular;  Laterality: N/A;  TEE WITHOUT CARDIOVERSION N/A 03/18/2019   Procedure: TRANSESOPHAGEAL ECHOCARDIOGRAM (TEE);  Surgeon: Okey Vina GAILS, MD;  Location: [facility] ENDOSCOPY;  Service: Cardiovascular;  Laterality: N/A;   TEE WITHOUT CARDIOVERSION N/A 04/20/2019   Procedure: TRANSESOPHAGEAL ECHOCARDIOGRAM (TEE);  Surgeon: Dusty Sudie DEL, MD;  Location: [facility] OR;  Service: Open Heart Surgery;  Laterality: N/A;   TUBAL LIGATION  1970's   US  ECHOCARDIOGRAPHY  09/2011     Home Medications:  Prior to Admission medications   Medication Sig Start Date End Date Taking? Authorizing Provider  albuterol  (VENTOLIN  HFA) 108 (90 Base) MCG/ACT inhaler Inhale 2 puffs into the lungs every 6 (six) hours as needed for wheezing or shortness of breath. 02/08/20   Gladis Elsie BROCKS, PA-C  cephALEXin  (KEFLEX ) 500 MG capsule  Take 1 capsule (500 mg total) by mouth 4 (four) times daily. 05/04/24   Logan Ubaldo NOVAK, PA-C  Doxylamine  Succinate, Sleep, (SLEEP-AID PO) Take 1 tablet by mouth daily as needed.    [provider]  famotidine  (PEPCID ) 20 MG tablet Take 1 tablet (20 mg total) by mouth 2 (two) times daily. 03/13/24   Vicky Charleston, PA-C  Fluticasone -Umeclidin-Vilant (TRELEGY ELLIPTA ) 200-62.5-25 MCG/ACT AEPB Inhale 1 puff into the lungs daily. 03/05/23   Hunsucker, Donnice SAUNDERS, MD  losartan  (COZAAR ) 50 MG tablet Take 50 mg by mouth daily.    [provider]  metoprolol  tartrate (LOPRESSOR ) 50 MG tablet Take 50 mg by mouth 2 (two) times daily. 03/02/23   [provider]  pantoprazole  (PROTONIX ) 40 MG tablet Take 1 tablet (40 mg total) by mouth daily. 08/18/22   Zehr, Jessica D, PA-C  rosuvastatin  (CRESTOR ) 40 MG tablet Take 40 mg by mouth daily. 06/11/22   [provider]  sucralfate  (CARAFATE ) 1 g tablet Take 1 tablet (1 g total) by mouth 4 (four) times daily -  with meals and at bedtime. 03/13/24   Vicky Charleston, PA-C    Scheduled Meds:  arformoterol  15 mcg Nebulization BID   budesonide  (PULMICORT ) nebulizer solution  0.25 mg Nebulization BID   famotidine   20 mg Oral BID   ipratropium-albuterol   3 mL Nebulization QID   methylPREDNISolone  (SOLU-MEDROL ) injection  40 mg Intravenous Q12H   nicotine   14 mg Transdermal Daily   pantoprazole   40 mg Oral Daily   rosuvastatin   40 mg Oral Daily   sodium chloride  flush  3 mL Intravenous Q12H   sucralfate   1 g Oral TID WC & HS   Continuous Infusions:  cefTRIAXone  (ROCEPHIN )  IV     doxycycline  (VIBRAMYCIN ) IV     potassium chloride      PRN Meds: acetaminophen  **OR** acetaminophen , albuterol , fentaNYL  (SUBLIMAZE ) injection, HYDROcodone -acetaminophen   Allergies:   No Known Allergies  Social History:   Social History   Socioeconomic History   Marital status: Widowed    Spouse name: Not on file   Number of children: 3    Years of education: 51   Highest education level: Not on file  Occupational History   Occupation: Event organiser: Publishing copy    Comment: manages cleaning business  Tobacco Use   Smoking status: Every Day    Current packs/day: 0.00    Average packs/day: 0.5 packs/day for 53.0 years (26.5 ttl pk-yrs)    Types: Cigarettes    Start date: 03/17/1966    Last attempt to quit: 03/18/2019    Years since quitting: 5.3    Passive exposure: Current (Son who lives with her Smokes)   Smokeless  tobacco: Never  Vaping Use   Vaping status: Never Used  Substance and Sexual Activity   Alcohol use: Not Currently   Drug use: Yes    Types: Cocaine    Comment: 04/28/2024   Sexual activity: Not Currently  Other Topics Concern   Not on file  Social History Narrative          Lives in [place] with her son.   Retired   Chief Executive Officer Drivers of Corporate investment banker Strain: Not on [employer] Insecurity: No Food Insecurity (03/10/2024)   Hunger Vital Sign    Worried About Running Out of Food in the Last Year: Never true    Ran Out of Food in the Last Year: Never true  Transportation Needs: Unmet Transportation Needs (03/10/2024)   PRAPARE - Administrator, Civil Service (Medical): Yes    Lack of Transportation (Non-Medical): Yes  Physical Activity: Not on file  Stress: Not on file  Social Connections: Patient Declined (03/10/2024)   Social Connection and Isolation Panel    Frequency of Communication with Friends and Family: Patient declined    Frequency of Social Gatherings with Friends and Family: Patient declined    Attends Religious Services: Patient declined    Database administrator or Organizations: Patient declined    Attends Banker Meetings: Patient declined    Marital Status: Patient declined  Intimate Partner Violence: Not At Risk (03/10/2024)   Humiliation, Afraid, Rape, and Kick questionnaire    Fear of Current or Ex-Partner: No     Emotionally Abused: No    Physically Abused: No    Sexually Abused: No    Family History:   Family History  Problem Relation Age of Onset   COPD Mother        DECEASED/SMOKED   Diabetes Brother    Diabetes Brother    Colon cancer Neg Hx    Rectal cancer Neg Hx    Esophageal cancer Neg Hx    Stomach cancer Neg Hx      ROS:  Please see the history of present illness.  All other ROS reviewed and negative.      Chart prepped, by Raphael Bring, PA with Randine Bihari, MD to formally evaluate and finalize recommendations.   Physical Exam/Data: Vitals:   07/17/24 0732 07/17/24 0734 07/17/24 0736 07/17/24 0840  BP:      Pulse:   (!) 101 (!) 102  Resp:   (!) 29 (!) 24  Temp:      TempSrc:      SpO2: 100% 100% 100% 100%    Intake/Output Summary (Last 24 hours) at 07/17/2024 0844 Last data filed at 07/17/2024 9357 Gross per 24 hour  Intake 923.33 ml  Output --  Net 923.33 ml      06/01/2024    5:30 AM 05/04/2024    4:46 PM 03/12/2024   11:25 PM  Last 3 Weights  Weight (lbs) 140 lb 140 lb 140 lb  Weight (kg) 63.504 kg 63.504 kg 63.504 kg     There is no height or weight on file to calculate BMI.  GEN: Well nourished, well developed in no acute distress HEENT: Normal NECK: No JVD; No carotid bruits LYMPHATICS: No lymphadenopathy CARDIAC:RRR, no murmurs, rubs, gallops RESPIRATORY: Scattered expiratory wheezes with decreased breath sounds throughout ABDOMEN: Soft, non-tender, non-distended MUSCULOSKELETAL:  No edema; No deformity  SKIN: Warm and dry NEUROLOGIC:  Alert and oriented x 3 PSYCHIATRIC:  Normal affect  EKG:   Telemetry:  Telemetry was personally reviewed and demonstrates: Normal sinus rhythm  Relevant CV Studies: 2D echo 07/17/2024 personally reviewed and demonstrates IMPRESSIONS    1. Elevated LVOT velocity of 4.3 m/s increasing to 4.84 m/s with  valsalva.   2. Left ventricular ejection fraction, by estimation, is >75%. The left  ventricle has hyperdynamic  function. The left ventricle has no regional  wall motion abnormalities. There is mild left ventricular hypertrophy.  Left ventricular diastolic parameters  are indeterminate.   3. Right ventricular systolic function is normal. The right ventricular  size is normal. There is normal pulmonary artery systolic pressure.   4. Left atrial size was mildly dilated.   5. The mitral valve has been repaired/replaced. Mild mitral valve  regurgitation. Severe mitral stenosis. There is a 30 mm Mitral Memo  prosthetic annuloplasty ring present in the mitral position. Procedure  Date: 04/20/19.   6. The aortic valve has an indeterminant number of cusps. Aortic valve  regurgitation is not visualized. No aortic stenosis is present.   7. The inferior vena cava is dilated in size with >50% respiratory  variability, suggesting right atrial pressure of 8 mmHg.   Laboratory Data: High Sensitivity Troponin:  No results for input(s): TROPONINIHS in the last 720 hours.   Chemistry Recent Labs  Lab 07/16/24 1940 07/17/24 0033 07/17/24 0335  NA 137  --  139  K 3.1*  --  3.0*  CL 100  --  102  CO2 25  --  20*  GLUCOSE 148*  --  166*  BUN 15  --  15  CREATININE 1.22*  --  1.24*  CALCIUM  9.3  --  9.6  MG  --  3.3* 3.0*  GFRNONAA 46*  --  45*  ANIONGAP 12  --  17*    Recent Labs  Lab 07/16/24 1940 07/17/24 0335  PROT 6.1* 6.3*  ALBUMIN  3.5 3.5  AST 22 24  ALT 11 11  ALKPHOS 68 71  BILITOT 0.3 0.2   Lipids No results for input(s): CHOL, TRIG, HDL, LABVLDL, LDLCALC, CHOLHDL in the last 168 hours.  Hematology Recent Labs  Lab 07/16/24 1940 07/17/24 0335  WBC 9.7 4.9  RBC 3.70* 3.27*  HGB 11.0* 9.3*  HCT 34.2* 30.7*  MCV 92.4 93.9  MCH 29.7 28.4  MCHC 32.2 30.3  RDW 12.8 12.8  PLT 205 134*   Thyroid   Recent Labs  Lab 07/17/24 0033  TSH 0.284*    BNP Recent Labs  Lab 07/17/24 0033  PROBNP 7,015.0*    DDimer No results for input(s): DDIMER in the last 168  hours.  Radiology/Studies:  DG Chest Port 1 View Result Date: 07/16/2024 EXAM: 1 VIEW(S) XRAY OF THE CHEST 07/16/2024 08:08:00 PM COMPARISON: 06/01/2024 CLINICAL HISTORY: Questionable sepsis - evaluate for abnormality. Questionable sepsis - evaluate for abnormality; SOB FINDINGS: LUNGS AND PLEURA: Hazy opacity in the right mid and lower lung. Mild chronic interstitial lung markings. No pulmonary edema. No pleural effusion. No pneumothorax. HEART AND MEDIASTINUM: Atherosclerotic plaque noted. No acute abnormality of the cardiac and mediastinal silhouettes. BONES AND SOFT TISSUES: No acute osseous abnormality. IMPRESSION: 1. Hazy opacities in the right mid and lower lung suspicious for pneumonia. Follow up in 8-10 weeks is recommended to ensure resolution. Electronically signed by: Norman Gatlin MD 07/16/2024 08:14 PM EDT RP Workstation: [id number]     Assessment and Plan:  Acute hypoxemic respiratory failure CAP pneumonia/COPD exacerbation Acute on chronic diastolic CHF Elevated D-dimer AKI on CKD stage  III AA Hypokalemia - Presented with several day history of shortness of breath and chest tightness and admitted with respiratory failure, tachypnea, tachycardia and chest x-ray with RML/RLL opacities initially requiring BiPAP - Weaned off BiPAP with improved breathing - Currently on IV Solu-Medrol , DuoNebs and IV antibiotics - Complicated by acute on chronic diastolic CHF - Patient admits to noncompliance with low-sodium diet and eating out a lot recently.  Also admits to recent cocaine use.  2D echo this admission shows significantly elevated mitral valve gradient with a mean of 13 mmHg.  There is also significant LVOT gradient with Valsalva on echo. - Given IV Lasix  40 mg IV at 3 AM this morning in the ER but I's and O's are incomplete - Chest CT 07/17/2024 showed no PE and small bilateral pleural effusions - 2D echo 07/17/2024: EF greater then 75% with increased LVOT velocity of 4.3 m/s  increased to 4.84 m/s with Valsalva - Will start Lasix  40 mg IV daily and titrate as needed based on diuresis - Follow strict I's and O's, daily weights and renal function while diuresing - SCr 1.24 (1.22 on admission and had been as high as 1.58 on 07/09/2024) - K+ 3: Replete K+ to keep>4 - Mag 3.0; TSH 0.284>> check FT4 and T3 - Continue losartan  50 mg daily - PTA was on Lopressor  50 mg twice daily>> would avoid further use of beta-blocker due to positive cocaine use as well as COPD   Chest pain CAD Hyperlipidemia - mild nonobstructive CAD by cath 2020  - Suspect related to underlying pneumonia and COPD exacerbation as well as CHF - hs troponin negative at 19 x 2 - EKG nonischemic - Continue Crestor  40 mg daily  Hypertension -BP elevated on exam today -Was on Lopressor  50 mg twice daily PTA>> this has been discontinued due to cocaine use and COPD -Consider increasing losartan  to 100 mg daily  New normocytic anemia  -baseline Hgb 14-16 range, down to 9.3 - Further workup per [facility]  Mitral valve disease - Status post mitral valve repair 2020 - 2D echo this admission with 30 mm mitral memo prosthetic annuloplasty ring with severe mitral stenosis mean mitral valve gradient 13 mmHg (was 8 mmHg in 2023) and mild MR - May benefit from heart catheterization for further evaluation versus TEE  Postoperative atrial fibrillation - Diagnosed in 2020 at the time for mitral valve repair - Has not had any reoccurrence - Has not been on DOAC given occurrence in the perioperative period and no episodes since  Polysubstance abuse  - history of tobacco/cocaine use - Positive for cocaine use this admission - Encouraged cessation  PAD  -AAA s/p stent graft 2023   Risk Assessment/Risk Scores:        [place]  Heart Association (NYHA) Functional Class NYHA Class IV    CHA2DS2-VASc Score = 5   This indicates a 7.2% annual risk of stroke. The patient's score is based upon: CHF History:  1 HTN History: 1 Diabetes History: 0 Stroke History: 0 Vascular Disease History: 0 Age Score: 2 Gender Score: 1    For questions or updates, please contact [facility] Please consult [email or web address] for contact info under      Signed, Wilbert Bihari, MD [facility] 07/17/2024

## 2024-07-17 NOTE — ED Notes (Signed)
 Pt placed on purewick, pt is sob when moving to the [facility], bedpan attempted and pt is still very sob when moving.

## 2024-07-18 ENCOUNTER — Inpatient Hospital Stay (HOSPITAL_COMMUNITY): Payer: Medicare (Managed Care)

## 2024-07-18 DIAGNOSIS — J441 Chronic obstructive pulmonary disease with (acute) exacerbation: Secondary | ICD-10-CM | POA: Diagnosis not present

## 2024-07-18 DIAGNOSIS — J9601 Acute respiratory failure with hypoxia: Secondary | ICD-10-CM | POA: Diagnosis not present

## 2024-07-18 DIAGNOSIS — J189 Pneumonia, unspecified organism: Secondary | ICD-10-CM | POA: Diagnosis not present

## 2024-07-18 DIAGNOSIS — I5030 Unspecified diastolic (congestive) heart failure: Secondary | ICD-10-CM

## 2024-07-18 DIAGNOSIS — I350 Nonrheumatic aortic (valve) stenosis: Secondary | ICD-10-CM

## 2024-07-18 LAB — RENAL FUNCTION PANEL
Albumin: 3.5 g/dL (ref 3.5–5.0)
Anion gap: 12 (ref 5–15)
BUN: 16 mg/dL (ref 8–23)
CO2: 22 mmol/L (ref 22–32)
Calcium: 10 mg/dL (ref 8.9–10.3)
Chloride: 103 mmol/L (ref 98–111)
Creatinine, Ser: 1.17 mg/dL — ABNORMAL HIGH (ref 0.44–1.00)
GFR, Estimated: 48 mL/min — ABNORMAL LOW (ref >60)
Glucose, Bld: 140 mg/dL — ABNORMAL HIGH (ref 70–99)
Phosphorus: 3.2 mg/dL (ref 2.5–4.6)
Potassium: 4.2 mmol/L (ref 3.5–5.1)
Sodium: 137 mmol/L (ref 135–145)

## 2024-07-18 LAB — CBC
HCT: 34.7 % — ABNORMAL LOW (ref 36.0–46.0)
Hemoglobin: 10.9 g/dL — ABNORMAL LOW (ref 12.0–15.0)
MCH: 29.1 pg (ref 26.0–34.0)
MCHC: 31.4 g/dL (ref 30.0–36.0)
MCV: 92.8 fL (ref 80.0–100.0)
Platelets: 219 K/uL (ref 150–400)
RBC: 3.74 MIL/uL — ABNORMAL LOW (ref 3.87–5.11)
RDW: 13 % (ref 11.5–15.5)
WBC: 16.5 K/uL — ABNORMAL HIGH (ref 4.0–10.5)
nRBC: 0 % (ref 0.0–0.2)

## 2024-07-18 LAB — LEGIONELLA PNEUMOPHILA SEROGP 1 UR AG: L. pneumophila Serogp 1 Ur Ag: NEGATIVE

## 2024-07-18 LAB — T3: T3, Total: 80 ng/dL (ref 71–180)

## 2024-07-18 LAB — SEDIMENTATION RATE: Sed Rate: 60 mm/h — ABNORMAL HIGH (ref 0–22)

## 2024-07-18 MED ORDER — REVEFENACIN 175 MCG/3ML IN SOLN
175.0000 ug | Freq: Every day | RESPIRATORY_TRACT | Status: DC
  Administered 2024-07-18 – 2024-07-25 (×8): 175 ug via RESPIRATORY_TRACT
  Filled 2024-07-18 (×8): qty 3

## 2024-07-18 MED ORDER — FUROSEMIDE 10 MG/ML IJ SOLN
40.0000 mg | Freq: Three times a day (TID) | INTRAMUSCULAR | Status: DC
Start: 2024-07-18 — End: 2024-07-20
  Administered 2024-07-18 – 2024-07-20 (×5): 40 mg via INTRAVENOUS
  Filled 2024-07-18 (×5): qty 4

## 2024-07-18 MED ORDER — METHIMAZOLE 5 MG PO TABS
5.0000 mg | ORAL_TABLET | Freq: Two times a day (BID) | ORAL | Status: DC
  Administered 2024-07-18 – 2024-07-25 (×15): 5 mg via ORAL
  Filled 2024-07-18 (×16): qty 1

## 2024-07-18 MED ORDER — MELATONIN 5 MG PO TABS
5.0000 mg | ORAL_TABLET | Freq: Every evening | ORAL | Status: DC | PRN
  Administered 2024-07-18 – 2024-07-24 (×6): 5 mg via ORAL
  Filled 2024-07-18 (×6): qty 1

## 2024-07-18 MED ORDER — PROPRANOLOL HCL 10 MG PO TABS
10.0000 mg | ORAL_TABLET | Freq: Three times a day (TID) | ORAL | Status: DC
  Administered 2024-07-18 – 2024-07-19 (×2): 10 mg via ORAL
  Filled 2024-07-18 (×2): qty 1

## 2024-07-18 NOTE — Evaluation (Signed)
 Occupational Therapy Evaluation Patient Details Name: Katie [name] MRN: [id number] DOB: 26-Jan-1946 Today's Date: 07/18/2024   History of Present Illness   78 yo female admitted with acute respiratory failure, COPD exac, acute on chronic HF, bil pleural effuisons. Hx of CKD, severe MVRs/p repair 2020, AAA s/p stent graft 2023, cocaine use, HF, COPD, renal cell ca, CAD     Clinical Impressions PTA, patient lives at home with son and was independent with all aspects of B/IADL's and ambulation including driving without supplemental O2 needs. Currently, patient presents with deficits outlined below (see OT Problem List for details) most significantly decreased balance and activity tolerance with supplemental O2 at 3 ltrs this session limiting BADL's and functional mobility performance. Patient requires continued Acute care hospital level OT services to progress safety and functional performance and allow for discharge. OT recommending HHOT services upon discharge with transfer tub bench and BSC if agreeable.       If plan is discharge home, recommend the following:   A little help with walking and/or transfers;A little help with bathing/dressing/bathroom;Assistance with cooking/housework;Assist for transportation;Help with stairs or ramp for entrance     Functional Status Assessment   Patient has had a recent decline in their functional status and demonstrates the ability to make significant improvements in function in a reasonable and predictable amount of time.     Equipment Recommendations   BSC/3in1;Tub/shower bench (if patient agreeable)      Precautions/Restrictions   Precautions Precautions: Fall Precaution/Restrictions Comments: monitor O2, HR Restrictions Weight Bearing Restrictions Per Provider Order: No     Mobility Bed Mobility Overal bed mobility: Needs Assistance Bed Mobility: Supine to Sit, Sit to Supine     Supine to sit: Supervision, HOB elevated Sit  to supine: Supervision, HOB elevated   General bed mobility comments: min cues for O2 tubing    Transfers Overall transfer level: Needs assistance Equipment used: Rolling walker (2 wheels) Transfers: Sit to/from Stand             General transfer comment: Supv for safety, proper operation of rollator      Balance Overall balance assessment: Mild deficits observed, not formally tested                                         ADL either performed or assessed with clinical judgement   ADL Overall ADL's : Needs assistance/impaired Eating/Feeding: Independent;Sitting   Grooming: Wash/dry hands;Wash/dry face;Oral care;Sitting;Set up   Upper Body Bathing: Set up;Sitting   Lower Body Bathing: Minimal assistance;Sit to/from stand;Cueing for compensatory techniques Lower Body Bathing Details (indicate cue type and reason): cues for pacing and breathing Upper Body Dressing : Set up;Sitting   Lower Body Dressing: Minimal assistance;Sit to/from stand;Cueing for compensatory techniques   Toilet Transfer: Contact guard assist;Regular Toilet;Rolling walker (2 wheels) Toilet Transfer Details (indicate cue type and reason): cues for breathing and pacing integ Toileting- Clothing Manipulation and Hygiene: Minimal assistance;Sit to/from stand   Tub/ Engineer, structural: Minimal assistance   Functional mobility during ADLs: Minimal assistance;Rolling walker (2 wheels) General ADL Comments: educated in pacing and ECT for all tasks     Vision Baseline Vision/History: 1 Wears glasses;4 Cataracts Ability to See in Adequate Light: 0 Adequate Patient Visual Report: No change from baseline Vision Assessment?: Wears glasses for reading;Wears glasses for driving;No apparent visual deficits  Pertinent Vitals/Pain Pain Assessment Pain Assessment: No/denies pain     Extremity/Trunk Assessment Upper Extremity Assessment Upper Extremity Assessment: Right hand  dominant   Lower Extremity Assessment Lower Extremity Assessment: Defer to PT evaluation   Cervical / Trunk Assessment Cervical / Trunk Assessment: Normal   Communication Communication Communication: No apparent difficulties   Cognition Arousal: Alert Behavior During Therapy: WFL for tasks assessed/performed Cognition: No apparent impairments                               Following commands: Intact       Cueing  General Comments   Cueing Techniques: Verbal cues  cues for pacing and breathing with O2 via [place] and SpO2 100% but RR 24 and HR 105-120 bpm with light activity, nursing to issue IS           Home Living Family/patient expects to be discharged to:: Private residence Living Arrangements: Children Available Help at Discharge: Family Type of Home: House Home Access: Stairs to enter Secretary/administrator of Steps: 1   Home Layout: One level     Bathroom Shower/Tub: Tub/shower unit;Curtain   Firefighter: Standard     Home Equipment: Agricultural consultant (2 wheels);Cane - single point;Hand held shower head          Prior Functioning/Environment Prior Level of Function : Independent/Modified Independent             Mobility Comments: uses cane PRN ADLs Comments: indep including driving    OT Problem List: Decreased activity tolerance;Impaired balance (sitting and/or standing);Cardiopulmonary status limiting activity   OT Treatment/Interventions: Self-care/ADL training;Therapeutic exercise;Energy conservation;DME and/or AE instruction;Therapeutic activities;Patient/family education;Balance training      OT Goals(Current goals can be found in the care plan section)   Acute Rehab OT Goals Patient Stated Goal: to breathe better OT Goal Formulation: With patient Time For Goal Achievement: 08/01/24 Potential to Achieve Goals: Good ADL Goals Pt Will Perform Lower Body Dressing: with set-up;sit to/from stand Pt Will Transfer to Toilet:  with set-up;regular height toilet;ambulating Pt Will Perform Tub/Shower Transfer: Tub transfer;shower seat Pt/caregiver will Perform Home Exercise Program: Increased strength;Both right and left upper extremity;Independently;With written HEP provided Additional ADL Goal #1: Patient will teach back 4/5 ECT strategies with BADL's and mobility with min cues   OT Frequency:  Min 2X/week       AM-PAC OT 6 Clicks Daily Activity     Outcome Measure Help from another person eating meals?: None Help from another person taking care of personal grooming?: A Little Help from another person toileting, which includes using toliet, bedpan, or urinal?: A Little Help from another person bathing (including washing, rinsing, drying)?: A Little Help from another person to put on and taking off regular upper body clothing?: A Little Help from another person to put on and taking off regular lower body clothing?: A Little 6 Click Score: 19   End of Session Equipment Utilized During Treatment: Gait belt;Rolling walker (2 wheels);Oxygen  Nurse Communication: Mobility status;Other (comment) (nursing to issue and train in IS)  Activity Tolerance: Patient limited by fatigue Patient left: in bed;with call bell/phone within reach;with bed alarm set;with nursing/sitter in room;with SCD's reapplied  OT Visit Diagnosis: Unsteadiness on feet (R26.81)                Time: 8385-8354 OT Time Calculation (min): 31 min Charges:  OT General Charges $OT Visit: 1 Visit OT Evaluation $OT Eval Low Complexity:  1 Low OT Treatments $Self Care/Home Management : 8-22 mins  Dereck Agerton OT/L Acute Rehabilitation [name]  [phone]  07/18/2024, 5:33 PM

## 2024-07-18 NOTE — Discharge Instructions (Addendum)
Low Sodium Nutrition Therapy  °Eating less sodium can help you if you have high blood pressure, heart failure, or kidney or liver disease.  ° °Your body needs a little sodium, but too much sodium can cause your body to hold onto extra water. This extra water will raise your blood pressure and can cause damage to your heart, kidneys, or liver as they are forced to work harder.  ° °Sometimes you can see how the extra fluid affects you because your hands, legs, or belly swell. You may also hold water around your heart and lungs, which makes it hard to breathe.  ° °Even if you take medication for blood pressure or a water pill (diuretic) to remove fluid, it is still important to have less salt in your diet.  ° °Check with your primary care provider before drinking alcohol since it may affect the amount of fluid in your body and how your heart, kidneys, or liver work. °Sodium in Food °A low-sodium meal plan limits the sodium that you get from food and beverages to 1,500-2,000 milligrams (mg) per day. Salt is the main source of sodium. Read the nutrition label on the package to find out how much sodium is in one serving of a food.  °• Select foods with 140 milligrams (mg) of sodium or less per serving.  °• You may be able to eat one or two servings of foods with a little more than 140 milligrams (mg) of sodium if you are closely watching how much sodium you eat in a day.  °• Check the serving size on the label. The amount of sodium listed on the label shows the amount in one serving of the food. So, if you eat more than one serving, you will get more sodium than the amount listed. ° °Tips °Cutting Back on Sodium °• Eat more fresh foods.  °• Fresh fruits and vegetables are low in sodium, as well as frozen vegetables and fruits that have no added juices or sauces.  °• Fresh meats are lower in sodium than processed meats, such as bacon, sausage, and hotdogs.  °• Not all processed foods are unhealthy, but some processed foods  may have too much sodium.  °• Eat less salt at the table and when cooking. One of the ingredients in salt is sodium.  °• One teaspoon of table salt has 2,300 milligrams of sodium.  °• Leave the salt out of recipes for pasta, casseroles, and soups. °• Be a smart shopper.  °• Food packages that say “Salt-free”, sodium-free”, “very low sodium,” and “low sodium” have less than 140 milligrams of sodium per serving.  °• Beware of products identified as “Unsalted,” “No Salt Added,” “Reduced Sodium,” or “Lower Sodium.” These items may still be high in sodium. You should always check the nutrition label. °• Add flavors to your food without adding sodium.  °• Try lemon juice, lime juice, or vinegar.  °• Dry or fresh herbs add flavor.  °• Buy a sodium-free seasoning blend or make your own at home. °• You can purchase salt-free or sodium-free condiments like barbeque sauce in stores and online. Ask your registered dietitian nutritionist for recommendations and where to find them.  °•  °Eating in Restaurants °• Choose foods carefully when you eat outside your home. Restaurant foods can be very high in sodium. Many restaurants provide nutrition facts on their menus or their websites. If you cannot find that information, ask your server. Let your server know that you want your food   to be cooked without salt and that you would like your salad dressing and sauces to be served on the side.  °•  ° °• Foods Recommended °• Food Group • Foods Recommended  °• Grains • Bread, bagels, rolls without salted tops °Homemade bread made with reduced-sodium baking powder °Cold cereals, especially shredded wheat and puffed rice °Oats, grits, or cream of wheat °Pastas, quinoa, and rice °Popcorn, pretzels or crackers without salt °Corn tortillas  °• Protein Foods • Fresh meats and fish; turkey bacon (check the nutrition labels - make sure they are not packaged in a sodium solution) °Canned or packed tuna (no more than 4 ounces at 1 serving) °Beans  and peas °Soybeans) and tofu °Eggs °Nuts or nut butters without salt  °• Dairy • Milk or milk powder °Plant milks, such as rice and soy °Yogurt, including Greek yogurt °Small amounts of natural cheese (blocks of cheese) or reduced-sodium cheese can be used in moderation. (Swiss, ricotta, and fresh mozzarella cheese are lower in sodium than the others) °Cream Cheese °Low sodium cottage cheese  °• Vegetables • Fresh and frozen vegetables without added sauces or salt °Homemade soups (without salt) °Low-sodium, salt-free or sodium-free canned vegetables and soups  °• Fruit • Fresh and canned fruits °Dried fruits, such as raisins, cranberries, and prunes  °• Oils • Tub or liquid margarine, regular or without salt °Canola, corn, peanut, olive, safflower, or sunflower oils  °• Condiments • Fresh or dried herbs such as basil, bay leaf, dill, mustard (dry), nutmeg, paprika, parsley, rosemary, sage, or thyme.  °Low sodium ketchup °Vinegar  °Lemon or lime juice °Pepper, red pepper flakes, and cayenne. °Hot sauce contains sodium, but if you use just a drop or two, it will not add up to much.  °Salt-free or sodium-free seasoning mixes and marinades °Simple salad dressings: vinegar and oil  °•  °• Foods Not Recommended °• Food Group • Foods Not Recommended  °• Grains • Breads or crackers topped with salt °Cereals (hot/cold) with more than 300 mg sodium per serving °Biscuits, cornbread, and other “quick” breads prepared with baking soda °Pre-packaged bread crumbs °Seasoned and packaged rice and pasta mixes °Self-rising flours  °• Protein Foods • Cured meats: Bacon, ham, sausage, pepperoni and hot dogs °Canned meats (chili, vienna sausage, or sardines) °Smoked fish and meats °Frozen meals that have more than 600 mg of sodium per serving °Egg substitute (with added sodium)  °• Dairy • Buttermilk °Processed cheese spreads °Cottage cheese (1 cup may have over 500 mg of sodium; look for low-sodium.) °American or feta cheese °Shredded  Cheese has more sodium than blocks of cheese °String cheese  °• Vegetables • Canned vegetables (unless they are salt-free, sodium-free or low sodium) °Frozen vegetables with seasoning and sauces °Sauerkraut and pickled vegetables °Canned or dried soups (unless they are salt-free, sodium-free, or low sodium) °French fries and onion rings  °• Fruit • Dried fruits preserved with additives that have sodium  °• Oils • Salted butter or margarine, all types of olives  °• Condiments • Salt, sea salt, kosher salt, onion salt, and garlic salt °Seasoning mixes with salt °Bouillon cubes °Ketchup °Barbeque sauce and Worcestershire sauce unless low sodium °Soy sauce °Salsa, pickles, olives, relish °Salad dressings: ranch, blue cheese, Italian, and French.  °•  °• Low Sodium Sample 1-Day Menu  °• Breakfast • 1 cup cooked oatmeal  °• 1 slice whole wheat bread toast  °• 1 tablespoon peanut butter without salt  °• 1 banana  °•   1 cup 1% milk  °• Lunch • Tacos made with: 2 corn tortillas  °• ¼ cup black beans, low sodium  °• ½ cup roasted or grilled chicken (without skin)  °• ¼ avocado  °• Squeeze of lime juice  °• 1 cup salad greens  °• 1 tablespoon low-sodium salad dressing  °• ¼ cup strawberries  °• 1 orange  °• Afternoon Snack • 1/3 cup grapes  °• 6 ounces yogurt  °• Evening Meal • 3 ounces herb-baked fish  °• 1 baked potato  °• 2 teaspoons olive oil  °• ½ cup cooked carrots  °• 2 thick slices tomatoes on:  °• 2 lettuce leaves  °• 1 teaspoon olive oil  °• 1 teaspoon balsamic vinegar  °• 1 cup 1% milk  °• Evening Snack • 1 apple  °• ¼ cup almonds without salt  °•  °• Low-Sodium Vegetarian (Lacto-Ovo) Sample 1-Day Menu  °• Breakfast • 1 cup cooked oatmeal  °• 1 slice whole wheat toast  °• 1 tablespoon peanut butter without salt  °• 1 banana  °• 1 cup 1% milk  °• Lunch • Tacos made with: 2 corn tortillas  °• ¼ cup black beans, low sodium  °• ½ cup roasted or grilled chicken (without skin)  °• ¼ avocado  °• Squeeze of lime juice  °• 1  cup salad greens  °• 1 tablespoon low-sodium salad dressing  °• ¼ cup strawberries  °• 1 orange  °• Evening Meal • Stir fry made with: ½ cup tofu  °• 1 cup brown rice  °• ½ cup broccoli  °• ½ cup green beans  °• ½ cup peppers  °• ½ tablespoon peanut oil  °• 1 orange  °• 1 cup 1% milk  °• Evening Snack • 4 strips celery  °• 2 tablespoons hummus  °• 1 hard-boiled egg  °•  °• Low-Sodium Vegan Sample 1-Day Menu  °• Breakfast • 1 cup cooked oatmeal  °• 1 tablespoon peanut butter without salt  °• 1 cup blueberries  °• 1 cup soymilk fortified with calcium, vitamin B12, and vitamin D  °• Lunch • 1 small whole wheat pita  °• ½ cup cooked lentils  °• 2 tablespoons hummus  °• 4 carrot sticks  °• 1 medium apple  °• 1 cup soymilk fortified with calcium, vitamin B12, and vitamin D  °• Evening Meal • Stir fry made with: ½ cup tofu  °• 1 cup brown rice  °• ½ cup broccoli  °• ½ cup green beans  °• ½ cup peppers  °• ½ tablespoon peanut oil  °• 1 cup cantaloupe  °• Evening Snack • 1 cup soy yogurt  °• ¼ cup mixed nuts  °• Copyright 2020 © [employer]. All rights reserved °•  °• Sodium Free Flavoring Tips °•  °• When cooking, the following items may be used for flavoring instead of salt or seasonings that contain sodium. °• Remember: A little bit of spice goes a long way! Be careful not to overseason. °• Spice Blend Recipe (makes about ? cup) °• 5 teaspoons onion powder  °• 2½ teaspoons garlic powder  °• 2½ teaspoons paprika  °• 2½ teaspoon dry mustard  °• 1½ teaspoon crushed thyme leaves  °• ½ teaspoon white pepper  °• ¼ teaspoon celery seed °Food Item Flavorings  °Beef Basil, bay leaf, caraway, curry, dill, dry mustard, garlic, grape jelly, green pepper, mace, marjoram, mushrooms (fresh), nutmeg, onion or onion powder, parsley, pepper,   rosemary, sage  °Chicken Basil, cloves, cranberries, mace, mushrooms (fresh), nutmeg, oregano, paprika, parsley, pineapple, saffron, sage, savory, tarragon, thyme, tomato,  turmeric  °Egg Chervil, curry, dill, dry mustard, garlic or garlic powder, green pepper, jelly, mushrooms (fresh), nutmeg, onion powder, paprika, parsley, rosemary, tarragon, tomato  °Fish Basil, bay leaf, chervil, curry, dill, dry mustard, green pepper, lemon juice, marjoram, mushrooms (fresh), paprika, pepper, tarragon, tomato, turmeric  °Lamb Cloves, curry, dill, garlic or garlic powder, mace, mint, mint jelly, onion, oregano, parsley, pineapple, rosemary, tarragon, thyme  °Pork Applesauce, basil, caraway, chives, cloves, garlic or garlic powder, onion or onion powder, rosemary, thyme  °Veal Apricots, basil, bay leaf, currant jelly, curry, ginger, marjoram, mushrooms (fresh), oregano, paprika  °Vegetables Basil, dill, garlic or garlic powder, ginger, lemon juice, mace, marjoram, nutmeg, onion or onion powder, tarragon, tomato, sugar or sugar substitute, salt-free salad dressing, vinegar  °Desserts Allspice, anise, cinnamon, cloves, ginger, mace, nutmeg, vanilla extract, other extracts  ° °Copyright 2020 © [employer]. All rights reserved ° °================================================================== ° °Information on my medicine - ELIQUIS® (apixaban) ° °Why was Eliquis® prescribed for you? °Eliquis® was prescribed for you to reduce the risk of a blood clot forming that can cause a stroke if you have a medical condition called atrial fibrillation (a type of irregular heartbeat). ° °What do You need to know about Eliquis® ? °Take your Eliquis® TWICE DAILY - one tablet in the morning and one tablet in the evening with or without food. If you have difficulty swallowing the tablet whole please discuss with your pharmacist how to take the medication safely. ° °Take Eliquis® exactly as prescribed by your doctor and DO NOT stop taking Eliquis® without talking to the doctor who prescribed the medication.  Stopping may increase your risk of developing a stroke.  Refill your prescription before  you run out. ° °After discharge, you should have regular check-up appointments with your healthcare provider that is prescribing your Eliquis®.  In the future your dose may need to be changed if your kidney function or weight changes by a significant amount or as you get older. ° °What do you do if you miss a dose? °If you miss a dose, take it as soon as you remember on the same day and resume taking twice daily.  Do not take more than one dose of ELIQUIS at the same time to make up a missed dose. ° °Important Safety Information °A possible side effect of Eliquis® is bleeding. You should call your healthcare provider right away if you experience any of the following: °? Bleeding from an injury or your nose that does not stop. °? Unusual colored urine (red or dark brown) or unusual colored stools (red or black). °? Unusual bruising for unknown reasons. °? A serious fall or if you hit your head (even if there is no bleeding). ° °Some medicines may interact with Eliquis® and might increase your risk of bleeding or clotting while on Eliquis®. To help avoid this, consult your healthcare provider or pharmacist prior to using any new prescription or non-prescription medications, including herbals, vitamins, non-steroidal anti-inflammatory drugs (NSAIDs) and supplements. ° °This website has more information on Eliquis® (apixaban): http://www.eliquis.com/eliquis/home °

## 2024-07-18 NOTE — Progress Notes (Signed)
 [facility]                                                                              Katie [name], is a 78 y.o. female, DOB - [birth year], FMW:[id number] Admit date - 07/16/2024    Outpatient Primary MD for the patient is [name], MD  LOS - 2  days  Chief Complaint  Patient presents with   Shortness of Breath       Brief summary   Patient is a 79 year old female with history of COPD, tobacco abuse, cocaine use, diastolic CHF, hypertension, hyperlipidemia, CKD stage IIIa, CAD, history of severe MR status post mitral valve repair in 04/2019, AAA status post EVAR 01/2022 presented with shortness of breath, respiratory dress, cough, increased wheezing for last 3 days.  Patient reported acute worsening at the time of admission with a chest tightness.  Patient was found to be tachypneic, tachycardiac, respiratory distress, lactic acidosis, placed on BiPAP in ED Labs showed creatinine 1.22, K 3.1, LFTs normal, BNP 7015, troponin 19, CK 62 Procalcitonin less than 0.1,  Hemoglobin 11.0, was 14.09/20  TSH 0.2 Chest x-ray showed hazy opacities in the right middle and lower lung suspicious for pneumonia  Assessment & Plan      Acute respiratory failure with hypoxia (HCC) -Multifactorial likely due to CAP pneumonia, COPD exacerbation, acute on chronic diastolic CHF -Bipap weaned off this morning.  - CTA chest negative for PE, showed small bilateral pleural effusions, emphysema, Aortic atherosclerotic classification.  - Cardiology following, wean O2 as tolerated.  Counseled on smoking cessation.    COPD exacerbation (HCC),    CAP (community acquired pneumonia) - Wean O2 as tolerated, continue scheduled DuoNebs, Pulmicort , Brovana, IV Solu-Medrol  - Continue IV Rocephin , doxycycline  - CT head negative for acute PE, shows emphysema, still diffuse wheezing, added Yupelri  Acute on chronic diastolic CHF, history of CAD, severe MR s/p mitral valve repair in 04/2019 -  BNP 7015, received IV Lasix  in ED.  - 2D echo EF 75%, no regional WMA, indeterminate diastolic Parameters. - Reviewed cardiology recommendation, placed on Lasix  IV 40 mg 3 times daily - Strict I's and O's and daily weights - UDS positive for cocaine, counseled patient on cocaine cessation, holding off on beta-blocker  Lactic acidosis, anion gap metabolic acidosis -Unclear etiology, ABG did not show any respiratory acidosis, no GI losses, not on any medications causing acidosis.  Does not appear to be from sepsis.  -Likely from cocaine.  Procalcitonin less than 0.1, CK 62, lactic acidosis improved  Hyperthyroidism - New diagnosis, TSH 0.28, free T4 1.34, T3 80 - add methimazole 5 mg twice daily - check thyroid  peroxidase Ab, ESR, TSH receptor antibody, thyroid  ultrasound however will need outpatient radioactive iodine  uptake scan for further workup. - Outpatient referral to endocrinology at discharge for further workup  Normocytic anemia/iron deficiency - Hemoglobin 11.0 on admission, trended down to 9.3 on 9/28 -Anemia panel showed iron 17, percent saturation ratio 5, ferritin 97, FOBT pending - Hb improving today to 10.9  - Recommend outpatient GI workup for iron deficiency anemia  Essential hypertension - Beta-blocker discontinued due to prior history  of cocaine use - Continue IV Lasix     AAA (abdominal aortic aneurysm) without rupture - Continue BP control    HLD (hyperlipidemia) -Continue Crestor     Tobacco abuse disorder -Continue nicotine  patch    CKD stage G3a/A1, GFR 45-59 and albumin  creatinine ratio <30 mg/g (HCC) - Cr at baseline     QT prolongation -Replace K magnesium  3.0, avoid QT prolonging meds   Estimated body mass index is 26.74 kg/m as calculated from the following:   Height as of this encounter: 5' 1 (1.549 m).   Weight as of this encounter: 64.2 kg.  Code Status: DNR DVT Prophylaxis:  SCDs Start: 07/16/24 2149   Level of Care: Level of care:  Progressive Family Communication: Updated patient Disposition Plan:      Remains inpatient appropriate:      Procedures:    Consultants:   Cardiology  Antimicrobials:   Anti-infectives (From admission, onward)    Start     Dose/Rate Route Frequency Ordered Stop   07/17/24 2000  cefTRIAXone  (ROCEPHIN ) 2 g in sodium chloride  0.9 % 100 mL IVPB        2 g 200 mL/hr over 30 Minutes Intravenous Every 24 hours 07/16/24 2128 07/22/24 1959   07/17/24 1000  doxycycline  (VIBRAMYCIN ) 100 mg in sodium chloride  0.9 % 250 mL IVPB        100 mg 125 mL/hr over 120 Minutes Intravenous Every 12 hours 07/16/24 2128     07/16/24 1945  cefTRIAXone  (ROCEPHIN ) 2 g in sodium chloride  0.9 % 100 mL IVPB        2 g 200 mL/hr over 30 Minutes Intravenous Once 07/16/24 1933 07/16/24 2125   07/16/24 1945  doxycycline  (VIBRAMYCIN ) 100 mg in sodium chloride  0.9 % 250 mL IVPB        100 mg 125 mL/hr over 120 Minutes Intravenous  Once 07/16/24 1933 07/16/24 2339          Medications  arformoterol  15 mcg Nebulization BID   budesonide  (PULMICORT ) nebulizer solution  0.25 mg Nebulization BID   ipratropium-albuterol   3 mL Nebulization QID   losartan   50 mg Oral Daily   methylPREDNISolone  (SOLU-MEDROL ) injection  40 mg Intravenous Q12H   nicotine   14 mg Transdermal Daily   pantoprazole   40 mg Oral Daily   rosuvastatin   40 mg Oral Daily   sodium chloride  flush  3 mL Intravenous Q12H      Subjective:   Katie [name] was seen and examined today.  Seen earlier this morning, still wheezing throughout, BiPAP removed earlier.  In the morning.  No acute chest pain, abdominal pain, nausea or vomiting.  Objective:   Vitals:   07/18/24 0051 07/18/24 0327 07/18/24 0514 07/18/24 1241  BP:   (!) 156/96 (!) 133/93  Pulse:   98 (!) 109  Resp: (!) 23 (!) 22 16 20   Temp:   97.8 F (36.6 C) 98 F (36.7 C)  TempSrc:   Oral Oral  SpO2:  98% 100% 100%  Weight:      Height:        Intake/Output Summary (Last  24 hours) at 07/18/2024 1337 Last data filed at 07/18/2024 0000 Gross per 24 hour  Intake 1229.58 ml  Output 300 ml  Net 929.58 ml     Wt Readings from Last 3 Encounters:  07/17/24 64.2 kg  06/01/24 63.5 kg  05/04/24 63.5 kg    Physical Exam General: Alert and oriented x 3, NAD Cardiovascular: S1 S2 clear, RRR.  Respiratory: Diffuse wheezing bilaterally Gastrointestinal: Soft, nontender, nondistended, NBS Ext: no pedal edema bilaterally Neuro: no new deficits Psych: Normal affect     Data Reviewed:  I have personally reviewed following labs    CBC Lab Results  Component Value Date   WBC 16.5 (H) 07/18/2024   RBC 3.74 (L) 07/18/2024   HGB 10.9 (L) 07/18/2024   HCT 34.7 (L) 07/18/2024   MCV 92.8 07/18/2024   MCH 29.1 07/18/2024   PLT 219 07/18/2024   MCHC 31.4 07/18/2024   RDW 13.0 07/18/2024   LYMPHSABS 1.2 07/16/2024   MONOABS 0.7 07/16/2024   EOSABS 0.1 07/16/2024   BASOSABS 0.0 07/16/2024     Last metabolic panel Lab Results  Component Value Date   NA 137 07/18/2024   K 4.2 07/18/2024   CL 103 07/18/2024   CO2 22 07/18/2024   BUN 16 07/18/2024   CREATININE 1.17 (H) 07/18/2024   GLUCOSE 140 (H) 07/18/2024   GFRNONAA 48 (L) 07/18/2024   GFRAA 37 (L) 04/26/2020   CALCIUM  10.0 07/18/2024   PHOS 3.2 07/18/2024   PROT 6.3 (L) 07/17/2024   ALBUMIN  3.5 07/18/2024   BILITOT 0.2 07/17/2024   ALKPHOS 71 07/17/2024   AST 24 07/17/2024   ALT 11 07/17/2024   ANIONGAP 12 07/18/2024    CBG (last 3)  No results for input(s): GLUCAP in the last 72 hours.    Coagulation Profile: Recent Labs  Lab 07/16/24 1940  INR 1.0     Radiology Studies: I have personally reviewed the imaging studies  CT Angio Chest Pulmonary Embolism (PE) W or WO Contrast Result Date: 07/17/2024 EXAM: CTA CHEST 07/17/2024 12:32:13 PM TECHNIQUE: CTA of the chest was performed without and with the administration of 60 mL of iohexol  (OMNIPAQUE ) 350 MG/ML injection. Multiplanar  reformatted images are provided for review. MIP images are provided for review. Automated exposure control, iterative reconstruction, and/or weight based adjustment of the mA/kV was utilized to reduce the radiation dose to as low as reasonably achievable. COMPARISON: 11/27/2022 and 06/01/2024. CLINICAL HISTORY: Pulmonary embolism (PE) suspected, high prob. FINDINGS: PULMONARY ARTERIES: Pulmonary arteries are adequately opacified for evaluation. No acute pulmonary embolus. Main pulmonary artery measures 3.7 cm in diameter. MEDIASTINUM: Status post mitral valve replacement. Aortic atherosclerotic calcification. There is no acute abnormality of the thoracic aorta. LYMPH NODES: No mediastinal, hilar or axillary lymphadenopathy. LUNGS AND PLEURA: Emphysema. Stable pleural plaque in the right lung base containing calcifications measuring 1.6 cm, image 105/12. Small bilateral pleural effusions. No pneumothorax. UPPER ABDOMEN: Signs of previous stent graft repair of the abdominal aorta. Right lobe of liver cyst. SOFT TISSUES AND BONES: No acute bone or soft tissue abnormality. IMPRESSION: 1. No evidence of pulmonary embolism. 2. Small bilateral pleural effusions. 3. Increased caliber of the main pulmonary artery consistent with PA hypertension. Emphysema. 4. Aortic atherosclerotic calcification. Electronically signed by: Waddell Calk MD 07/17/2024 01:04 PM EDT RP Workstation: [id number]   ECHOCARDIOGRAM COMPLETE Result Date: 07/17/2024    ECHOCARDIOGRAM REPORT   Patient Name:   Katie [name] Date of Exam: 07/17/2024 Medical Rec #:  [id number]       Height:       61.0 in Accession #:    [id number]      Weight:       140.0 lb Date of Birth:  26-Apr-1946       BSA:          1.623 m Patient Age:    13 years  BP:           164/103 mmHg Patient Gender: F               HR:           101 bpm. Exam Location:  Inpatient Procedure: 2D Echo, Cardiac Doppler and Color Doppler (Both Spectral and Color            Flow Doppler  were utilized during procedure). Indications:    I50.31 Acute diastolic (congestive) heart failure  History:        Patient has prior history of Echocardiogram examinations, most                 recent 01/11/2022. COPD, Arrythmias:Atrial Fibrillation; Risk                 Factors:Hypertension and Dyslipidemia.                  Mitral Valve: 30 mm Mitral Memo prosthetic annuloplasty ring                 valve is present in the mitral position. Procedure Date: 04/20/19.  Sonographer:    Damien Senior RDCS Referring Phys: [id number] Gay Rape K Julyan Gales  Sonographer Comments: Scanned upright due to dyspnea, just taken off bipap IMPRESSIONS  1. Elevated LVOT velocity of 4.3 m/s increasing to 4.84 m/s with valsalva.  2. Left ventricular ejection fraction, by estimation, is >75%. The left ventricle has hyperdynamic function. The left ventricle has no regional wall motion abnormalities. There is mild left ventricular hypertrophy. Left ventricular diastolic parameters are indeterminate.  3. Right ventricular systolic function is normal. The right ventricular size is normal. There is normal pulmonary artery systolic pressure.  4. Left atrial size was mildly dilated.  5. The mitral valve has been repaired/replaced. Mild mitral valve regurgitation. Severe mitral stenosis. There is a 30 mm Mitral Memo prosthetic annuloplasty ring present in the mitral position. Procedure Date: 04/20/19.  6. The aortic valve has an indeterminant number of cusps. Aortic valve regurgitation is not visualized. No aortic stenosis is present.  7. The inferior vena cava is dilated in size with >50% respiratory variability, suggesting right atrial pressure of 8 mmHg. FINDINGS  Left Ventricle: Left ventricular ejection fraction, by estimation, is >75%. The left ventricle has hyperdynamic function. The left ventricle has no regional wall motion abnormalities. The left ventricular internal cavity size was normal in size. There is mild left ventricular hypertrophy. Left  ventricular diastolic parameters are indeterminate. Right Ventricle: The right ventricular size is normal. Right ventricular systolic function is normal. There is normal pulmonary artery systolic pressure. The tricuspid regurgitant velocity is 2.54 m/s, and with an assumed right atrial pressure of 8 mmHg,  the estimated right ventricular systolic pressure is 33.8 mmHg. Left Atrium: Left atrial size was mildly dilated. Right Atrium: Right atrial size was normal in size. Pericardium: There is no evidence of pericardial effusion. Mitral Valve: The mitral valve has been repaired/replaced. Mild mitral annular calcification. Mild mitral valve regurgitation. There is a 30 mm Mitral Memo prosthetic annuloplasty ring present in the mitral position. Procedure Date: 04/20/19. Severe mitral  valve stenosis. MV peak gradient, 25.0 mmHg. The mean mitral valve gradient is 13.0 mmHg with average heart rate of 95 bpm. Tricuspid Valve: The tricuspid valve is normal in structure. Tricuspid valve regurgitation is trivial. No evidence of tricuspid stenosis. Aortic Valve: The aortic valve has an indeterminant number of cusps. Aortic valve regurgitation is not visualized. No aortic  stenosis is present. Aortic valve mean gradient measures 9.0 mmHg. Aortic valve peak gradient measures 16.2 mmHg. Aortic valve area, by VTI measures 3.67 cm. Pulmonic Valve: The pulmonic valve was normal in structure. Pulmonic valve regurgitation is not visualized. No evidence of pulmonic stenosis. Aorta: The aortic root is normal in size and structure. Venous: The inferior vena cava is dilated in size with greater than 50% respiratory variability, suggesting right atrial pressure of 8 mmHg. IAS/Shunts: No atrial level shunt detected by color flow Doppler. Additional Comments: Elevated LVOT velocity of 4.3 m/s increasing to 4.84 m/s with valsalva.  LEFT VENTRICLE PLAX 2D LVIDd:         3.10 cm LVIDs:         2.20 cm LV PW:         1.30 cm LV IVS:        1.30 cm  LVOT diam:     2.00 cm LV SV:         123 LV SV Index:   76 LVOT Area:     3.14 cm LV IVRT:       106 msec  RIGHT VENTRICLE RV S prime:     12.00 cm/s TAPSE (M-mode): 1.9 cm LEFT ATRIUM             Index        RIGHT ATRIUM           Index LA diam:        3.20 cm 1.97 cm/m   RA Area:     13.00 cm LA Vol (A2C):   63.2 ml 38.94 ml/m  RA Volume:   28.70 ml  17.68 ml/m LA Vol (A4C):   51.8 ml 31.91 ml/m LA Biplane Vol: 59.5 ml 36.66 ml/m  AORTIC VALVE AV Area (Vmax):    3.27 cm AV Area (Vmean):   3.34 cm AV Area (VTI):     3.67 cm AV Vmax:           201.00 cm/s AV Vmean:          140.000 cm/s AV VTI:            0.335 m AV Peak Grad:      16.2 mmHg AV Mean Grad:      9.0 mmHg LVOT Vmax:         209.00 cm/s LVOT Vmean:        149.000 cm/s LVOT VTI:          0.391 m LVOT/AV VTI ratio: 1.17  AORTA Ao Root diam: 2.90 cm Ao Asc diam:  3.30 cm MITRAL VALVE              TRICUSPID VALVE MV Area VTI:  2.63 cm    TR Peak grad:   25.8 mmHg MV Peak grad: 25.0 mmHg   TR Vmax:        254.00 cm/s MV Mean grad: 13.0 mmHg MV Vmax:      2.50 m/s    SHUNTS MV Vmean:     176.0 cm/s  Systemic VTI:  0.39 m                           Systemic Diam: 2.00 cm Redell Shallow MD Electronically signed by Redell Shallow MD Signature Date/Time: 07/17/2024/12:09:01 PM    Final    DG Chest Port 1 View Result Date: 07/16/2024 EXAM: 1 VIEW(S) XRAY OF THE CHEST 07/16/2024 08:08:00 PM COMPARISON: 06/01/2024 CLINICAL  HISTORY: Questionable sepsis - evaluate for abnormality. Questionable sepsis - evaluate for abnormality; SOB FINDINGS: LUNGS AND PLEURA: Hazy opacity in the right mid and lower lung. Mild chronic interstitial lung markings. No pulmonary edema. No pleural effusion. No pneumothorax. HEART AND MEDIASTINUM: Atherosclerotic plaque noted. No acute abnormality of the cardiac and mediastinal silhouettes. BONES AND SOFT TISSUES: No acute osseous abnormality. IMPRESSION: 1. Hazy opacities in the right mid and lower lung suspicious for  pneumonia. Follow up in 8-10 weeks is recommended to ensure resolution. Electronically signed by: Norman Gatlin MD 07/16/2024 08:14 PM EDT RP Workstation: [id number]       Nydia Distance M.D. [facility] 07/18/2024, 1:37 PM  Available via Epic secure chat 7am-7pm After 7 pm, please refer to night coverage provider listed on amion.

## 2024-07-18 NOTE — TOC Initial Note (Addendum)
 Transition of Care [facility]) - Initial/Assessment Note    Patient Details  Name: Katie [name] MRN: [id number] Date of Birth: 04/27/1946  [facility] CM/SW Contact:    Bascom Service, RN Phone Number: 07/18/2024, 10:09 AM  Clinical Narrative:  d/c plan home.  Await PT eval & recc. Has insurance with obligated co pay for meds-doesn't qualify for med asst.               Expected Discharge Plan: Home/Self Care Barriers to Discharge: Continued Medical Work up   Patient Goals and CMS Choice Patient states their goals for this hospitalization and ongoing recovery are:: Home CMS Medicare.gov Compare Post Acute Care list provided to:: Patient Choice offered to / list presented to : Patient [place] ownership interest in [facility].provided to:: Patient    Expected Discharge Plan and Services   Discharge Planning Services: CM Consult   Living arrangements for the past 2 months: Single Family Home                                      Prior Living Arrangements/Services Living arrangements for the past 2 months: Single Family Home Lives with:: Adult Children                   Activities of Daily Living   ADL Screening (condition at time of admission) Independently performs ADLs?: Yes (appropriate for developmental age) Is the patient deaf or have difficulty hearing?: No Does the patient have difficulty seeing, even when wearing glasses/contacts?: No Does the patient have difficulty concentrating, remembering, or making decisions?: No  Permission Sought/Granted                  Emotional Assessment              Admission diagnosis:  COPD exacerbation (HCC) [J44.1] Pneumonia due to infectious organism, unspecified laterality, unspecified part of lung [J18.9] Patient Active Problem List   Diagnosis Date Noted   Normocytic anemia 07/17/2024   Elevated d-dimer 07/17/2024   Coronary artery disease due to lipid rich plaque  07/17/2024   Mitral valve disease 07/17/2024   Polysubstance abuse (HCC) 07/17/2024   AKI (acute kidney injury) 07/17/2024   COPD exacerbation (HCC) 07/16/2024   Hypokalemia 07/16/2024   Acute renal failure superimposed on stage 3a chronic kidney disease (HCC) 03/10/2024   Hypotension 04/21/2023   Nausea and vomiting 06/19/2022   Loss of weight    Ileus (HCC) 01/14/2022   Abnormal echocardiogram 01/11/2022   Gastroenteritis 01/10/2022   Thyroid  nodule 01/10/2022   Hepatic cyst 01/10/2022   Abdominal aortic aneurysm (AAA) 3.0 cm to 5.0 cm in diameter in female 01/09/2022   Intractable nausea and vomiting 07/23/2021   Generalized abdominal pain 07/23/2021   QT prolongation 07/23/2021   AAA (abdominal aortic aneurysm) without rupture 05/04/2020   Acute respiratory failure with hypoxia (HCC) 04/23/2020   Atrial fibrillation (HCC) 04/26/2019   S/P minimally invasive mitral valve repair 04/20/2019   Acute on chronic diastolic CHF (congestive heart failure) (HCC)    CAP (community acquired pneumonia) 04/04/2019   Smokers' cough (HCC) 03/13/2019   COPD with acute exacerbation (HCC) 03/13/2019   Fx metatarsal 03/07/2019   CKD stage G3a/A1, GFR 45-59 and albumin  creatinine ratio <30 mg/g (HCC) 09/29/2018   Osteoporosis 12/31/2016   Arthralgia of both hands 11/11/2016   Chest pain of uncertain etiology 04/17/2016   Hyperglycemia  08/03/2015   Esophageal reflux    Depression with anxiety    Tobacco abuse disorder 06/27/2014   Insomnia 07/15/2013   Severe mitral regurgitation 09/29/2011   COPD 09/26/2011   Primary hypertension 09/26/2011   HLD (hyperlipidemia) 09/26/2011   S/P cholecystectomy 09/26/2011   Holosystolic murmur 09/26/2011   PCP:  [name], MD Pharmacy:   [facility] [phone] [address] [place], [place] - [address] AT [facility] [address] [place] [address] Phone: [phone] Fax: [phone]  [facility]  - [place], [place] - [address] [place] [address] Phone: [phone] Fax: [phone]     Social Drivers of Health (SDOH) Social History: SDOH Screenings   Food Insecurity: No Food Insecurity (07/17/2024)  Housing: Low Risk  (07/17/2024)  Transportation Needs: Unmet Transportation Needs (07/17/2024)  Utilities: Not At Risk (07/17/2024)  Depression (PHQ2-9): Low Risk  (11/22/2020)  Social Connections: Moderately Isolated (07/17/2024)  Tobacco Use: High Risk (07/17/2024)   SDOH Interventions:     Readmission Risk Interventions    04/22/2023   11:57 AM  Readmission Risk Prevention Plan  Transportation Screening Complete  PCP or Specialist Appt within 3-5 Days Complete  HRI or Home Care Consult Complete  Social Work Consult for Recovery Care Planning/Counseling Complete  Palliative Care Screening Complete  Medication Review Oceanographer) Complete

## 2024-07-18 NOTE — Evaluation (Signed)
 Physical Therapy Evaluation Patient Details Name: Katie [name] MRN: [id number] DOB: 1946-05-12 Today's Date: 07/18/2024  History of Present Illness  78 yo female admitted with acute respiratory failure, COPD exac, acute on chronic HF, bil pleural effuisons. Hx of CKD, severe MVRs/p repair 2020, AAA s/p stent graft 2023, cocaine use, HF, COPD, renal cell ca, CAD  Clinical Impression  On eval, pt was Supv level for mobility. She walked ~150 feet with a RW. At rest, O2 100% 2L, HR 105 bpm. With ambulation, O2 92% on 2L, HR 124 bpm, dyspnea 2/4. Assisted pt back to bed. Will plan to follow pt during this hospital stay. Will recommend HHPT and a rollator.         If plan is discharge home, recommend the following: A little help with walking and/or transfers;A little help with bathing/dressing/bathroom;Assistance with cooking/housework;Assist for transportation;Help with stairs or ramp for entrance   Can travel by private vehicle        Equipment Recommendations Rollator (4 wheels)  Recommendations for Other Services       Functional Status Assessment Patient has had a recent decline in their functional status and demonstrates the ability to make significant improvements in function in a reasonable and predictable amount of time.     Precautions / Restrictions Precautions Precaution/Restrictions Comments: monitor O2, HR Restrictions Weight Bearing Restrictions Per Provider Order: No      Mobility  Bed Mobility Overal bed mobility: Needs Assistance Bed Mobility: Supine to Sit, Sit to Supine     Supine to sit: Supervision, HOB elevated Sit to supine: Supervision, HOB elevated   General bed mobility comments: Supv for lines    Transfers Overall transfer level: Needs assistance Equipment used: Rollator (4 wheels) Transfers: Sit to/from Stand             General transfer comment: Supv for safety, proper operation of rollator    Ambulation/Gait Ambulation/Gait  assistance: Supervision Gait Distance (Feet): 150 Feet Assistive device: Rollator (4 wheels) Gait Pattern/deviations: Step-through pattern, Decreased stride length       General Gait Details: Supv for safety. 1 seated rest break after ~100 feet. O2 92% on 2L, dyspnea 3/4, HR 124 bpm. Pt able to ambulate back to room as well.  Stairs            Wheelchair Mobility     Tilt Bed    Modified Rankin (Stroke Patients Only)       Balance Overall balance assessment: Mild deficits observed, not formally tested                                           Pertinent Vitals/Pain Pain Assessment Pain Assessment: No/denies pain    Home Living Family/patient expects to be discharged to:: Private residence Living Arrangements: Children Available Help at Discharge: Family Type of Home: House         Home Layout: One level Home Equipment: Agricultural consultant (2 wheels);Cane - single point      Prior Function Prior Level of Function : Independent/Modified Independent             Mobility Comments: uses cane PRN       Extremity/Trunk Assessment   Upper Extremity Assessment Upper Extremity Assessment: Defer to OT evaluation    Lower Extremity Assessment Lower Extremity Assessment: Generalized weakness    Cervical / Trunk Assessment Cervical / Trunk Assessment: Normal  Communication   Communication Communication: No apparent difficulties    Cognition Arousal: Alert Behavior During Therapy: WFL for tasks assessed/performed   PT - Cognitive impairments: No apparent impairments                         Following commands: Intact       Cueing Cueing Techniques: Verbal cues     General Comments      Exercises     Assessment/Plan    PT Assessment Patient needs continued PT services  PT Problem List Decreased strength;Decreased range of motion;Decreased activity tolerance;Decreased balance;Decreased mobility;Decreased knowledge of  use of DME       PT Treatment Interventions DME instruction;Gait training;Functional mobility training;Therapeutic activities;Therapeutic exercise;Patient/family education;Balance training    PT Goals (Current goals can be found in the Care Plan section)  Acute Rehab PT Goals Patient Stated Goal: get better PT Goal Formulation: With patient Time For Goal Achievement: 08/01/24 Potential to Achieve Goals: Good    Frequency Min 3X/week     Co-evaluation               AM-PAC PT 6 Clicks Mobility  Outcome Measure Help needed turning from your back to your side while in a flat bed without using bedrails?: None Help needed moving from lying on your back to sitting on the side of a flat bed without using bedrails?: None Help needed moving to and from a bed to a chair (including a wheelchair)?: A Little Help needed standing up from a chair using your arms (e.g., wheelchair or bedside chair)?: A Little Help needed to walk in hospital room?: A Little Help needed climbing 3-5 steps with a railing? : A Little 6 Click Score: 20    End of Session Equipment Utilized During Treatment: Oxygen ;Gait belt Activity Tolerance: Patient tolerated treatment well (limited by dyspnea) Patient left: in bed;with call bell/phone within reach;with bed alarm set   PT Visit Diagnosis: Muscle weakness (generalized) (M62.81);Difficulty in walking, not elsewhere classified (R26.2);Unsteadiness on feet (R26.81)    Time: 8981-8955 PT Time Calculation (min) (ACUTE ONLY): 26 min   Charges:   PT Evaluation $PT Eval Low Complexity: 1 Low PT Treatments $Gait Training: 8-22 mins PT General Charges $$ ACUTE PT VISIT: 1 Visit           Dannial SQUIBB, PT Acute Rehabilitation  Office: [phone]

## 2024-07-18 NOTE — Progress Notes (Signed)
 Nutrition Note  RD consulted for nutritional assessment.  Pt with COPD, CHF, h/o cocaine use PTA.  Pt consuming 100% of meals. Pt reports eating out a lot recently and not watching salt intake. Will place Low Sodium handout in AVS. Pt currently being diuresed. Weights have been trending up.   Wt Readings from Last 15 Encounters:  07/17/24 64.2 kg  06/01/24 63.5 kg  05/04/24 63.5 kg  03/12/24 63.5 kg  03/10/24 49.9 kg  04/22/23 52.9 kg  03/05/23 54 kg  11/27/22 53 kg  11/23/22 53.1 kg  08/18/22 53.7 kg  07/19/22 46.3 kg  06/19/22 (S) 53.3 kg  05/06/22 58 kg  04/02/22 58.1 kg  03/14/22 58.1 kg    Body mass index is 26.74 kg/m. Patient meets criteria for normal based on current BMI.   Current diet order is heart healthy, patient is consuming approximately 100% of meals at this time. Labs and medications reviewed.   No nutrition interventions warranted at this time. If nutrition issues arise, please consult RD.   Morna Lee, MS, RD, LDN Inpatient Clinical Dietitian Contact via Secure chat

## 2024-07-18 NOTE — Progress Notes (Signed)
  Progress Note  Patient Name: Katie [name] Date of Encounter: 07/18/2024 [facility] Cardiologist: Annabella Scarce, MD   Interval Summary   She reports worsening DOE today but denies orthopnea or PND. She also denies chest palpitations.   Vital Signs Vitals:   07/18/24 0000 07/18/24 0051 07/18/24 0327 07/18/24 0514  BP:    (!) 156/96  Pulse: (!) 110   98  Resp: (!) 28 (!) 23 (!) 22 16  Temp:    97.8 F (36.6 C)  TempSrc:    Oral  SpO2: 98%  98% 100%  Weight:      Height:        Intake/Output Summary (Last 24 hours) at 07/18/2024 1232 Last data filed at 07/18/2024 0000 Gross per 24 hour  Intake 1229.58 ml  Output 300 ml  Net 929.58 ml      07/17/2024    3:03 PM 06/01/2024    5:30 AM 05/04/2024    4:46 PM  Last 3 Weights  Weight (lbs) 141 lb 8.6 oz 140 lb 140 lb  Weight (kg) 64.2 kg 63.504 kg 63.504 kg      Telemetry/ECG  Sinus tachycardia - Personally Reviewed  Physical Exam  GEN: No acute distress.   Neck: +JVD Cardiac: Tachycardic but regular rhythm, no murmurs, rubs, or gallops.  Respiratory: Clear to auscultation bilaterally. GI: Soft, nontender, non-distended  Katie: No edema  Assessment & Plan  Katie [name] is a 59 yoF with Hx of MVP c/b severe MR s/p MV repair (2020), non-obstructive CAD, cocaine use, carotid disease, HFpEF, AAA s/p stent 2023, and CKDIII presenting with COPD exacerbation and HFpEF found to have MG 13 mmHg on TTE and significant LVOT gradient.  #COPD exacerbation #HFpEF exacerbation #Severe mitral stenosis #Significant LVOT gradient w/ valsalva #Non-obstructive CAD #Post op Afib not AC #Ongoing cocaine use #HTN - Presented with COPD and HFpEF exacerbation likely in setting of cocaine use. TTE with severe Katie and significant LVOT gradient. Trop/ECG unremarkable. CTPE with no PE. Currently on duonebs, methylpred, IV diuresis and losartan  50.  - IV diuresis 40 TID - Would obtain ABG given worsening shortness of breath and  significant increase work of breathing during interview - Will consider calcium  channel blockers given LVOT gradient if no improvement - Will need TEE once stable from respiratory standpoint - Would consult endocrine given evidence of hyperthyroidism - Outpatient colonoscopy given Hgb drop from 13-14 to 9  For questions or updates, please contact [facility] Please consult [email or web address] for contact info under   Signed, [name] Ren Donley, MD

## 2024-07-18 NOTE — Progress Notes (Signed)
   07/18/24 2100  Assess: MEWS Score  Temp 97.9 F (36.6 C)  BP (!) 126/91  MAP (mmHg) 103  Pulse Rate (!) 154 (AFTER BREATHING TX)  Resp (!) 24  SpO2 100 %  O2 Device Nasal Cannula  O2 Flow Rate (L/min) 2 L/min  Assess: MEWS Score  MEWS Temp 0  MEWS Systolic 0  MEWS Pulse 3  MEWS RR 1  MEWS LOC 0  MEWS Score 4  MEWS Score Color Red  Assess: if the MEWS score is Yellow or Red  Were vital signs accurate and taken at a resting state? Yes  Does the patient meet 2 or more of the SIRS criteria? Yes  Does the patient have a confirmed or suspected source of infection? No  MEWS guidelines implemented  No, previously red, continue vital signs every 4 hours  Notify: Charge Nurse/RN  Name of Charge Nurse/RN Notified Lauren, RN  Provider Notification  Provider Name/Title C. Shona, MD  Date Provider Notified 07/18/24  Time Provider Notified 2126  Method of Notification Page  Notification Reason Other (Comment) ([facility])  Provider response See new orders  Date of Provider Response 07/18/24  Time of Provider Response 2126  Assess: SIRS CRITERIA  SIRS Temperature  0  SIRS Respirations  1  SIRS Pulse 1  SIRS WBC 0  SIRS Score Sum  2

## 2024-07-19 ENCOUNTER — Inpatient Hospital Stay (HOSPITAL_COMMUNITY): Payer: Medicare (Managed Care)

## 2024-07-19 ENCOUNTER — Other Ambulatory Visit: Payer: Self-pay

## 2024-07-19 DIAGNOSIS — J441 Chronic obstructive pulmonary disease with (acute) exacerbation: Secondary | ICD-10-CM | POA: Diagnosis not present

## 2024-07-19 DIAGNOSIS — J9601 Acute respiratory failure with hypoxia: Secondary | ICD-10-CM | POA: Diagnosis not present

## 2024-07-19 DIAGNOSIS — I5030 Unspecified diastolic (congestive) heart failure: Secondary | ICD-10-CM | POA: Diagnosis not present

## 2024-07-19 DIAGNOSIS — J189 Pneumonia, unspecified organism: Secondary | ICD-10-CM | POA: Diagnosis not present

## 2024-07-19 LAB — CBC
HCT: 37.5 % (ref 36.0–46.0)
Hemoglobin: 11.9 g/dL — ABNORMAL LOW (ref 12.0–15.0)
MCH: 29.6 pg (ref 26.0–34.0)
MCHC: 31.7 g/dL (ref 30.0–36.0)
MCV: 93.3 fL (ref 80.0–100.0)
Platelets: 302 K/uL (ref 150–400)
RBC: 4.02 MIL/uL (ref 3.87–5.11)
RDW: 12.9 % (ref 11.5–15.5)
WBC: 17.4 K/uL — ABNORMAL HIGH (ref 4.0–10.5)
nRBC: 0 % (ref 0.0–0.2)

## 2024-07-19 LAB — THYROTROPIN RECEPTOR AUTOABS: Thyrotropin Receptor Ab: <1.1 IU/L (ref 0.00–1.75)

## 2024-07-19 LAB — RENAL FUNCTION PANEL
Albumin: 3.7 g/dL (ref 3.5–5.0)
Anion gap: 13 (ref 5–15)
BUN: 30 mg/dL — ABNORMAL HIGH (ref 8–23)
CO2: 26 mmol/L (ref 22–32)
Calcium: 10.4 mg/dL — ABNORMAL HIGH (ref 8.9–10.3)
Chloride: 101 mmol/L (ref 98–111)
Creatinine, Ser: 1.31 mg/dL — ABNORMAL HIGH (ref 0.44–1.00)
GFR, Estimated: 42 mL/min — ABNORMAL LOW (ref >60)
Glucose, Bld: 105 mg/dL — ABNORMAL HIGH (ref 70–99)
Phosphorus: 3.5 mg/dL (ref 2.5–4.6)
Potassium: 4.7 mmol/L (ref 3.5–5.1)
Sodium: 140 mmol/L (ref 135–145)

## 2024-07-19 LAB — BLOOD GAS, ARTERIAL
Acid-Base Excess: 8.5 mmol/L — ABNORMAL HIGH (ref 0.0–2.0)
Bicarbonate: 32.8 mmol/L — ABNORMAL HIGH (ref 20.0–28.0)
Drawn by: 270271
O2 Saturation: 99 %
Patient temperature: 37.1
pCO2 arterial: 43 mmHg (ref 32–48)
pH, Arterial: 7.49 — ABNORMAL HIGH (ref 7.35–7.45)
pO2, Arterial: 94 mmHg (ref 83–108)

## 2024-07-19 LAB — THYROID PEROXIDASE ANTIBODY: Thyroperoxidase Ab SerPl-aCnc: 10 [IU]/mL (ref 0–34)

## 2024-07-19 LAB — LACTIC ACID, PLASMA: Lactic Acid, Venous: 1.4 mmol/L (ref 0.5–1.9)

## 2024-07-19 MED ORDER — HYDRALAZINE HCL 10 MG PO TABS
10.0000 mg | ORAL_TABLET | Freq: Three times a day (TID) | ORAL | Status: DC
  Administered 2024-07-19 – 2024-07-23 (×10): 10 mg via ORAL
  Filled 2024-07-19 (×12): qty 1

## 2024-07-19 MED ORDER — SENNOSIDES-DOCUSATE SODIUM 8.6-50 MG PO TABS
1.0000 | ORAL_TABLET | Freq: Two times a day (BID) | ORAL | Status: DC
  Administered 2024-07-19 – 2024-07-20 (×2): 1 via ORAL
  Filled 2024-07-19 (×6): qty 1

## 2024-07-19 MED ORDER — LEVALBUTEROL HCL 0.63 MG/3ML IN NEBU
0.6300 mg | INHALATION_SOLUTION | Freq: Four times a day (QID) | RESPIRATORY_TRACT | Status: DC
  Administered 2024-07-19 – 2024-07-20 (×5): 0.63 mg via RESPIRATORY_TRACT
  Filled 2024-07-19 (×6): qty 3

## 2024-07-19 MED ORDER — POLYETHYLENE GLYCOL 3350 17 G PO PACK
17.0000 g | PACK | Freq: Every day | ORAL | Status: DC
  Administered 2024-07-19: 17 g via ORAL
  Filled 2024-07-19 (×4): qty 1

## 2024-07-19 MED ORDER — LACTULOSE 10 GM/15ML PO SOLN
20.0000 g | Freq: Once | ORAL | Status: AC
  Administered 2024-07-19: 20 g via ORAL
  Filled 2024-07-19: qty 30

## 2024-07-19 MED ORDER — LORATADINE 10 MG PO TABS
10.0000 mg | ORAL_TABLET | Freq: Every day | ORAL | Status: DC
  Administered 2024-07-20 – 2024-07-25 (×5): 10 mg via ORAL
  Filled 2024-07-19 (×7): qty 1

## 2024-07-19 MED ORDER — LEVALBUTEROL HCL 0.63 MG/3ML IN NEBU
0.6300 mg | INHALATION_SOLUTION | RESPIRATORY_TRACT | Status: DC | PRN
  Filled 2024-07-19: qty 3

## 2024-07-19 MED ORDER — IPRATROPIUM BROMIDE 0.02 % IN SOLN
0.5000 mg | Freq: Four times a day (QID) | RESPIRATORY_TRACT | Status: DC
Start: 2024-07-19 — End: 2024-07-21
  Administered 2024-07-19 – 2024-07-20 (×5): 0.5 mg via RESPIRATORY_TRACT
  Filled 2024-07-19 (×6): qty 2.5

## 2024-07-19 NOTE — Progress Notes (Signed)
 Pt requesting to be placed on bipap for SOB at this time.

## 2024-07-19 NOTE — TOC Progression Note (Addendum)
 Transition of Care [facility]) - Progression Note    Patient Details  Name: Katie [name] MRN: [id number] Date of Birth: 1946-04-05  [facility] CM/SW Contact  Liston Thum, Nathanel, RN Phone Number: 07/19/2024, 11:15 AM  Clinical Narrative:  Beatris to Elizabeth(sister) informed of dme)rollator,3n1) may need home 02, BIPAP/CPAP & HHPT/OT needed-no preference-[employer] rep Cindie -HHPT;[employer] rep Jermaine- rollator,3n1(he will follow if home 02, Bipap/CPAP needed with documentation & orders)Has own transport home.     Expected Discharge Plan: Home w Home Health Services Barriers to Discharge: Continued Medical Work up               Expected Discharge Plan and Services   Discharge Planning Services: CM Consult Post Acute Care Choice: Home Health, Durable Medical Equipment Living arrangements for the past 2 months: Single Family Home                 DME Arranged: 3-N-1, Walker rolling with seat DME Agency: [employer] Date DME Agency Contacted: 07/19/24 Time DME Agency Contacted: 1106 Representative spoke with at DME Agency: [place]             Social Drivers of Health (SDOH) Interventions SDOH Screenings   Food Insecurity: No Food Insecurity (07/17/2024)  Housing: Low Risk  (07/17/2024)  Transportation Needs: Unmet Transportation Needs (07/17/2024)  Utilities: Not At Risk (07/17/2024)  Depression (PHQ2-9): Low Risk  (11/22/2020)  Social Connections: Moderately Isolated (07/17/2024)  Tobacco Use: High Risk (07/17/2024)    Readmission Risk Interventions    04/22/2023   11:57 AM  Readmission Risk Prevention Plan  Transportation Screening Complete  PCP or Specialist Appt within 3-5 Days Complete  HRI or Home Care Consult Complete  Social Work Consult for Recovery Care Planning/Counseling Complete  Palliative Care Screening Complete  Medication Review Oceanographer) Complete

## 2024-07-19 NOTE — Progress Notes (Signed)
   07/19/24 0216  BiPAP/CPAP/SIPAP  BiPAP/CPAP/SIPAP Pt Type Adult  BiPAP/CPAP/SIPAP V60  Mask Type Full face mask  Dentures removed? Not applicable  Mask Size Small  Set Rate 18 breaths/min  Respiratory Rate 23 breaths/min  IPAP 12 cmH20  EPAP 6 cmH2O  FiO2 (%) 28 %  Minute Ventilation 9  Leak 0  Peak Inspiratory Pressure (PIP) 12  Tidal Volume (Vt) 383  Patient Home Machine No  Patient Home Mask No  Patient Home Tubing No  Auto Titrate No  Press High Alarm 35 cmH2O  Press Low Alarm 5 cmH2O  Device Plugged into RED Power Outlet Yes

## 2024-07-19 NOTE — Progress Notes (Signed)
 Pt complained of chest pain. Asked her to point where the pain was and she pointed at her sternum. EKG done. (See result). BP- 186/118, HR 85, RR 21, O2 sat 100% on RA. Will recheck BP again. Dr. KYM Hurst notified. CN Lauren, RN made aware.  Rechecked BP 192/111 mmhg. Hydralazine  PRN given. No complaints of pain at this time.  0533: Latest BP 151/97.

## 2024-07-19 NOTE — Progress Notes (Signed)
   07/19/24 2024  BiPAP/CPAP/SIPAP  BiPAP/CPAP/SIPAP Pt Type Adult  BiPAP/CPAP/SIPAP V60  Reason BIPAP/CPAP not in use  (Pt stable, on [place] wanrts to try tonight without)  BiPAP/CPAP /SiPAP Vitals  Resp 19  MEWS Score/Color  MEWS Score 1  MEWS Score Color Green

## 2024-07-19 NOTE — Progress Notes (Addendum)
  Progress Note  Patient Name: Cris Gibby Date of Encounter: 07/19/2024 [facility] Cardiologist: Annabella Scarce, MD   Interval Summary   No events overnight. This morning, she reports good urine output from the lasix  and feels like it helped. She does not feel like the breathing treatments have made much difference. She was tried on the BiPAP but the mask came sliding off. Overall, she does not feel significantly improved.   Vital Signs Vitals:   07/19/24 0345 07/19/24 0533 07/19/24 0805 07/19/24 0859  BP: (!) 192/111 (!) 151/97  (!) 145/105  Pulse: 79 95  (!) 103  Resp:  20  (!) 22  Temp:  97.8 F (36.6 C)    TempSrc:  Oral    SpO2:  100% 100% 100%  Weight:  59.8 kg    Height:        Intake/Output Summary (Last 24 hours) at 07/19/2024 1050 Last data filed at 07/19/2024 0541 Gross per 24 hour  Intake 1109.84 ml  Output 2100 ml  Net -990.16 ml      07/19/2024    5:33 AM 07/17/2024    3:03 PM 06/01/2024    5:30 AM  Last 3 Weights  Weight (lbs) 131 lb 13.4 oz 141 lb 8.6 oz 140 lb  Weight (kg) 59.8 kg 64.2 kg 63.504 kg      Telemetry/ECG  NSR - Personally Reviewed  Physical Exam  GEN: No acute distress.   Neck: No JVD Cardiac: RRR, no murmurs, rubs, or gallops.  Respiratory: Clear to auscultation bilaterally w/ significant both inspiratory and expiratory wheezing GI: Soft, nontender, non-distended  MS: No edema  Assessment & Plan  Ms Szeto is a 69 yoF with Hx of MVP c/b severe MR s/p MV repair (2020), non-obstructive CAD, cocaine use, carotid disease, HFpEF, AAA s/p stent 2023, and CKDIII presenting with COPD exacerbation and HFpEF found to have MG 13 mmHg on TTE and significant LVOT gradient.   #COPD exacerbation #HFpEF exacerbation #Severe mitral stenosis #Significant LVOT gradient w/ valsalva #Non-obstructive CAD #Post op Afib not AC #Ongoing cocaine use #HTN - Presented with COPD and HFpEF exacerbation likely in setting of cocaine use.  TTE with severe MS and significant LVOT gradient. Trop/ECG unremarkable. CTPE with no PE. Currently on duonebs, methylpred, IV diuresis and losartan  50.  - Continues have significant dyspnea, and prominent expiratory/inspiratory wheezing. I think a trial of BiPAP might be helpful given that nebulizer treatment does not seem to help. - Obtain ABG, lactate, and chest x-ray - Cont IV diuresis 40 TID - Will need TEE once stable from respiratory standpoint   For questions or updates, please contact  [facility] Please consult [email or web address] for contact info under   CRITICAL CARE TIME Performed by: [name] Ren Donley, MD   Total critical care time: 35 minutes  Critical care time was exclusive of separately billable procedures and treating other patients.  Critical care was necessary to treat or prevent imminent or life-threatening deterioration.  Critical care was time spent personally by me on the following activities: development of treatment plan with patient and/or surrogate as well as nursing, discussions with consultants, evaluation of patient's response to treatment, examination of patient, obtaining history from patient or surrogate, ordering and performing treatments and interventions, ordering and review of laboratory studies, ordering and review of radiographic studies, pulse oximetry and re-evaluation of patient's condition. Signed, [name] Ren Donley, MD

## 2024-07-19 NOTE — Progress Notes (Signed)
 Occupational Therapy Treatment Patient Details Name: Katie [name] MRN: [id number] DOB: 1946/07/13 Today's Date: 07/19/2024   History of present illness 78 yo female admitted with acute respiratory failure, COPD exac, acute on chronic HF, bil pleural effuisons. Hx of CKD, severe MVRs/p repair 2020, AAA s/p stent graft 2023, cocaine use, HF, COPD, renal cell ca, CAD   OT comments  Patient seen for skilled OT treatment session and open to all therapy presented including OOB to recliner with education on pacing, breathing integration and energy conservation. See below for current status and response to session. Patient requires continued Acute care hospital level OT services to progress safety and functional performance and allow for discharge. Recommendations for HHOT remain appropriate.        If plan is discharge home, recommend the following:  A little help with walking and/or transfers;A little help with bathing/dressing/bathroom;Assistance with cooking/housework;Assist for transportation;Help with stairs or ramp for entrance   Equipment Recommendations  BSC/3in1;Tub/shower bench (provided information on types  if agreeable)       Precautions / Restrictions Precautions Precautions: Fall Precaution/Restrictions Comments: monitor O2, HR Restrictions Weight Bearing Restrictions Per Provider Order: No       Mobility Bed Mobility Overal bed mobility: Needs Assistance Bed Mobility: Supine to Sit     Supine to sit: Supervision     General bed mobility comments: min cues for lines and pacing    Transfers Overall transfer level: Needs assistance Equipment used: Rolling walker (2 wheels) Transfers: Sit to/from Stand, Bed to chair/wheelchair/BSC Sit to Stand: Contact guard assist     Step pivot transfers: Contact guard assist     General transfer comment: support for O2 tubing, RW integration for transfer     Balance Overall balance assessment: Mild deficits observed, not  formally tested                                         ADL either performed or assessed with clinical judgement   ADL Overall ADL's : Needs assistance/impaired Eating/Feeding: Independent;Sitting   Grooming: Wash/dry hands;Wash/dry face;Oral care;Sitting;Set up   Upper Body Bathing: Set up;Sitting   Lower Body Bathing: Minimal assistance;Sit to/from stand;Cueing for compensatory techniques Lower Body Bathing Details (indicate cue type and reason): cues for pacing and breathing Upper Body Dressing : Set up;Sitting   Lower Body Dressing: Contact guard assist;Sit to/from stand Lower Body Dressing Details (indicate cue type and reason): educated this visit on strategies for figure 4, pacing and compensatory strategies to limit effortful bending Toilet Transfer: Contact guard assist   Toileting- Clothing Manipulation and Hygiene: Contact guard assist       Functional mobility during ADLs: Contact guard assist;Rolling walker (2 wheels) General ADL Comments: issued and trained in 5 P's/ECT Handout, breathing and pacing integration    Extremity/Trunk Assessment Upper Extremity Assessment Upper Extremity Assessment: Right hand dominant;Generalized weakness   Lower Extremity Assessment Lower Extremity Assessment: Defer to PT evaluation        Vision   Vision Assessment?: Wears glasses for reading;Wears glasses for driving;No apparent visual deficits         Communication Communication Communication: No apparent difficulties   Cognition Arousal: Alert Behavior During Therapy: WFL for tasks assessed/performed Cognition: No apparent impairments  Following commands: Intact        Cueing   Cueing Techniques: Verbal cues        General Comments SpO2 remained 98% this visit with HFNC 28% FiO2/2 ltrs, posted tips for breathing on white board and rec for IS 10 reps q 1 hr    Pertinent Vitals/ Pain       Pain  Assessment Pain Assessment: No/denies pain   Frequency  Min 2X/week        Progress Toward Goals  OT Goals(current goals can now be found in the care plan section)  Progress towards OT goals: Progressing toward goals  Acute Rehab OT Goals Patient Stated Goal: to keep getting stronger OT Goal Formulation: With patient Time For Goal Achievement: 08/01/24 Potential to Achieve Goals: Good ADL Goals Pt Will Perform Lower Body Dressing: with set-up;sit to/from stand Pt Will Transfer to Toilet: with set-up;regular height toilet;ambulating Pt Will Perform Tub/Shower Transfer: Tub transfer;shower seat Pt/caregiver will Perform Home Exercise Program: Increased strength;Both right and left upper extremity;Independently;With written HEP provided Additional ADL Goal #1: Patient will teach back 4/5 ECT strategies with BADL's and mobility with min cues  Plan         AM-PAC OT 6 Clicks Daily Activity     Outcome Measure   Help from another person eating meals?: None Help from another person taking care of personal grooming?: A Little Help from another person toileting, which includes using toliet, bedpan, or urinal?: A Little Help from another person bathing (including washing, rinsing, drying)?: A Little Help from another person to put on and taking off regular upper body clothing?: A Little Help from another person to put on and taking off regular lower body clothing?: A Little 6 Click Score: 19    End of Session Equipment Utilized During Treatment: Gait belt;Rolling walker (2 wheels);Oxygen   OT Visit Diagnosis: Unsteadiness on feet (R26.81)   Activity Tolerance Patient tolerated treatment well   Patient Left in chair;with call bell/phone within reach;with chair alarm set   Nurse Communication Mobility status        Time: 8384-8349 OT Time Calculation (min): 35 min  Charges: OT General Charges $OT Visit: 1 Visit OT Treatments $Therapeutic Activity: 23-37  mins   07/19/2024, 5:10 PM

## 2024-07-19 NOTE — Progress Notes (Signed)
 Arrived to unit. Patient up in chair with food tray. Spoke with charge RN notified to place consult when pt back in bed and ready for IV team. VU. Powell Bowler, RN VAST

## 2024-07-19 NOTE — Progress Notes (Signed)
 [facility]                                                                              Katie [name], is a 78 y.o. female, DOB - [birth year], FMW:[id number] Admit date - 07/16/2024    Outpatient Primary MD for the patient is Katie Leni Edyth DELENA, MD  LOS - 3  days  Chief Complaint  Patient presents with   Shortness of Breath       Brief summary   Patient is a 78 year old female with history of COPD, tobacco abuse, cocaine use, diastolic CHF, hypertension, hyperlipidemia, CKD stage IIIa, CAD, history of severe MR status post mitral valve repair in 04/2019, AAA status post EVAR 01/2022 presented with shortness of breath, respiratory dress, cough, increased wheezing for last 3 days.  Patient reported acute worsening at the time of admission with a chest tightness.  Patient was found to be tachypneic, tachycardiac, respiratory distress, lactic acidosis, placed on BiPAP in ED Labs showed creatinine 1.22, K 3.1, LFTs normal, BNP 7015, troponin 19, CK 62 Procalcitonin less than 0.1,  Hemoglobin 11.0, was 14.09/20  TSH 0.2 Chest x-ray showed hazy opacities in the right middle and lower lung suspicious for pneumonia  Assessment & Plan      Acute respiratory failure with hypoxia (HCC) -Multifactorial likely due to CAP pneumonia, COPD exacerbation, acute on chronic diastolic CHF - CTA chest negative for PE, showed small bilateral pleural effusions, emphysema, Aortic atherosclerotic classification.  - Overnight was placed on BiPAP however per patient the mask was not fitted well and it was leaking.,  No significant improvement in the breathing still has wheezing.    COPD exacerbation (HCC),    CAP (community acquired pneumonia) - Wean O2 as tolerated - Continue Xopenex , Atrovent  nebs, Pulmicort , Brovana, Yupelri, IV Solu-Medrol . - Continue IV Rocephin , doxycycline  - Nebs changed to Xopenex  due to tachycardia - if no significant improvement by tomorrow, will request   pulmonology consult  Acute on chronic diastolic CHF, history of CAD, severe MR s/p mitral valve repair in 04/2019 - BNP 7015, received IV Lasix  in ED.  - 2D echo EF 75%, no regional WMA, indeterminate diastolic Parameters. -  UDS positive for cocaine, counseled patient on cocaine cessation, holding off on beta-blocker - Continue Lasix  40 mg IV 3 times daily, still positive balance of 322 - Strict I's and O's and daily weights - Cardiology following, will need TEE once stable from respiratory standpoint  Lactic acidosis, anion gap metabolic acidosis -Unclear etiology, ABG did not show any respiratory acidosis, no GI losses, not on any medications causing acidosis.  Does not appear to be from sepsis.  -Likely from cocaine.  Procalcitonin less than 0.1, CK 62, lactic acidosis improved  Hyperthyroidism - New diagnosis, TSH 0.28, free T4 1.34, T3 80 - add methimazole 5 mg twice daily - TPO Ab, TSH receptor antibody, ESR 60.  - Thyroid  US  showed moderately heterogeneous thyroid  parenchyma, multiple nodules, none meet criteria for biopsy, may need Tekela Garguilo uptake scan for further workup outpatient.   - Outpatient referral to endocrinology at discharge for further workup  Normocytic anemia/iron deficiency - Hemoglobin 11.0 on  admission, trended down to 9.3 on 9/28 -Anemia panel showed iron 17, percent saturation ratio 5, ferritin 97, FOBT pending - H&H is stable/improving - Recommend outpatient GI workup for iron deficiency anemia  Essential hypertension - Beta-blocker discontinued due to prior history of cocaine use - Continue IV Lasix     AAA (abdominal aortic aneurysm) without rupture - Continue BP control    HLD (hyperlipidemia) -Continue Crestor     Tobacco abuse disorder -Continue nicotine  patch    CKD stage G3a/A1, GFR 45-59 and albumin  creatinine ratio <30 mg/g (HCC) - Cr at baseline     QT prolongation -Replace K magnesium  3.0, avoid QT prolonging meds   Estimated body mass  index is 24.91 kg/m as calculated from the following:   Height as of this encounter: 5' 1 (1.549 m).   Weight as of this encounter: 59.8 kg.  Code Status: DNR DVT Prophylaxis:  SCDs Start: 07/16/24 2149   Level of Care: Level of care: Progressive Family Communication: Updated patient's sister at the bedside Disposition Plan:      Remains inpatient appropriate:      Procedures:    Consultants:   Cardiology  Antimicrobials:   Anti-infectives (From admission, onward)    Start     Dose/Rate Route Frequency Ordered Stop   07/17/24 2000  cefTRIAXone  (ROCEPHIN ) 2 g in sodium chloride  0.9 % 100 mL IVPB        2 g 200 mL/hr over 30 Minutes Intravenous Every 24 hours 07/16/24 2128 07/22/24 1959   07/17/24 1000  doxycycline  (VIBRAMYCIN ) 100 mg in sodium chloride  0.9 % 250 mL IVPB        100 mg 125 mL/hr over 120 Minutes Intravenous Every 12 hours 07/16/24 2128     07/16/24 1945  cefTRIAXone  (ROCEPHIN ) 2 g in sodium chloride  0.9 % 100 mL IVPB        2 g 200 mL/hr over 30 Minutes Intravenous Once 07/16/24 1933 07/16/24 2125   07/16/24 1945  doxycycline  (VIBRAMYCIN ) 100 mg in sodium chloride  0.9 % 250 mL IVPB        100 mg 125 mL/hr over 120 Minutes Intravenous  Once 07/16/24 1933 07/16/24 2339          Medications  arformoterol  15 mcg Nebulization BID   budesonide  (PULMICORT ) nebulizer solution  0.25 mg Nebulization BID   furosemide   40 mg Intravenous TID   hydrALAZINE   10 mg Oral Q8H   levalbuterol   0.63 mg Nebulization Q6H   And   ipratropium  0.5 mg Nebulization Q6H   losartan   50 mg Oral Daily   methIMAzole  5 mg Oral BID   methylPREDNISolone  (SOLU-MEDROL ) injection  40 mg Intravenous Q12H   nicotine   14 mg Transdermal Daily   pantoprazole   40 mg Oral Daily   revefenacin  175 mcg Nebulization Daily   rosuvastatin   40 mg Oral Daily   sodium chloride  flush  3 mL Intravenous Q12H      Subjective:   Katie [name] was seen and examined today.  Per patient,  breathing has not much improved, needed BiPAP this morning however mask was not fitting and leaking.  Still has wheezing.  No fever chills, chest pain.  No nausea vomiting or abdominal, eating breakfast during encounter.  Sister at the bedside.  Objective:   Vitals:   07/19/24 0533 07/19/24 0805 07/19/24 0859 07/19/24 1250  BP: (!) 151/97  (!) 145/105 (!) 137/90  Pulse: 95  (!) 103 96  Resp: 20  (!) 22  20  Temp: 97.8 F (36.6 C)   98.2 F (36.8 C)  TempSrc: Oral   Oral  SpO2: 100% 100% 100% 100%  Weight: 59.8 kg     Height:        Intake/Output Summary (Last 24 hours) at 07/19/2024 1322 Last data filed at 07/19/2024 1200 Gross per 24 hour  Intake 1229.84 ml  Output 3000 ml  Net -1770.16 ml     Wt Readings from Last 3 Encounters:  07/19/24 59.8 kg  06/01/24 63.5 kg  05/04/24 63.5 kg   Physical Exam General: Alert and oriented x 3, NAD Cardiovascular: S1 S2 clear, RRR.  Respiratory: B/L expiratory wheezing Gastrointestinal: Soft, nontender, nondistended, NBS Ext: no pedal edema bilaterally Neuro: no new deficits Psych: Normal affect, pleasant  Data Reviewed:  I have personally reviewed following labs    CBC Lab Results  Component Value Date   WBC 17.4 (H) 07/19/2024   RBC 4.02 07/19/2024   HGB 11.9 (L) 07/19/2024   HCT 37.5 07/19/2024   MCV 93.3 07/19/2024   MCH 29.6 07/19/2024   PLT 302 07/19/2024   MCHC 31.7 07/19/2024   RDW 12.9 07/19/2024   LYMPHSABS 1.2 07/16/2024   MONOABS 0.7 07/16/2024   EOSABS 0.1 07/16/2024   BASOSABS 0.0 07/16/2024     Last metabolic panel Lab Results  Component Value Date   NA 140 07/19/2024   K 4.7 07/19/2024   CL 101 07/19/2024   CO2 26 07/19/2024   BUN 30 (H) 07/19/2024   CREATININE 1.31 (H) 07/19/2024   GLUCOSE 105 (H) 07/19/2024   GFRNONAA 42 (L) 07/19/2024   GFRAA 37 (L) 04/26/2020   CALCIUM  10.4 (H) 07/19/2024   PHOS 3.5 07/19/2024   PROT 6.3 (L) 07/17/2024   ALBUMIN  3.7 07/19/2024   BILITOT 0.2 07/17/2024    ALKPHOS 71 07/17/2024   AST 24 07/17/2024   ALT 11 07/17/2024   ANIONGAP 13 07/19/2024    CBG (last 3)  No results for input(s): GLUCAP in the last 72 hours.    Coagulation Profile: Recent Labs  Lab 07/16/24 1940  INR 1.0     Radiology Studies: I have personally reviewed the imaging studies  DG ABD ACUTE 2+V W 1V CHEST Result Date: 07/19/2024 CLINICAL DATA:  Chronic diastolic heart failure. EXAM: DG ABDOMEN ACUTE WITH 1 VIEW CHEST COMPARISON:  July 16, 2024. FINDINGS: Mildly dilated small bowel loops are noted in the upper abdomen suggesting ileus or possible obstruction. No definite colonic dilatation is noted. Moderate stool seen in right colon. No radiopaque calculi. Status post cholecystectomy and aortic stent graft placement. Phleboliths are noted in the pelvis. Heart size and mediastinal contours are within normal limits. Right infrahilar atelectasis or infiltrate is noted. IMPRESSION: 1. Mildly dilated small bowel loops are noted in the upper abdomen suggesting ileus or possible obstruction. 2. Right infrahilar atelectasis or infiltrate is noted. Electronically Signed   By: Lynwood Landy Raddle M.D.   On: 07/19/2024 12:19   US  THYROID  Result Date: 07/18/2024 EXAM: US  THYROID  07/18/2024 03:15:25 PM TECHNIQUE: Real-time ultrasound scan of the thyroid  gland and soft tissues of the neck with image documentation. COMPARISON: None available. CLINICAL HISTORY: 01547 Hyperthyroidism 98452. Hyperthyroidism FINDINGS: Right thyroid  lobe: 4.1 x 1.4 x 1.8 cm Left thyroid  lobe: 3.4 x 1.3 x 1.3 cm Isthmus: 0.2 cm in thickness Echotexture: Parenchyma echotexture moderately heterogeneous. Vascularity: Normal Vascularity. Thyroid  Nodules: Nodule 1: Mid right, 1.2 x 0.8 x 1 cm, Composition: mostly solid (2 pts), Echogenicity: isoechoic (1 pt),  Shape: Wider than tall (0 pts), Margin: Smooth (0 pts), Echogenic Foci: None (0 pts), TI-RADS category: TR3. Nodule 2: Inferior right, 1.3 x 0.3 x 0.6 cm,  Composition: complex cyst (Mixed cystic and solid) (1 pt), Echogenicity: Isoechoic (1 pt), Shape: Wider than tall (0 pts), Margin: Smooth (0 pts), Echogenic Foci: None (0 pts), TI-RADS category: TR2. Nodule 3: Superior left, 0.6 x 0.6 x 0.5 cm, Composition: complex cyst (Mixed cystic and solid) (1 pt), Echogenicity: Isoechoic (1 pt), Shape: Wider than tall (0 pts), Margin: Smooth (0 pts), Echogenic Foci: None (0 pts), TI-RADS category: TR2. ACR TI-RADS recommendations: TR5 (7 or more points) - FNA if greater than or equal to 1 cm; if 0.5 - 0.9 cm follow-up every year for 5 years. TR4 (4-6 points) - FNA if greater than or equal to 1.5 cm; if 1 - 1.4 cm follow-up in 1, 2, 3 and 5 years. TR3 (3 points) - FNA if greater than or equal to 2.5 cm; if 1.5 - 2.4 cm follow-up in 1, 3 and 5 years. TR2 (2 points) and TR1 (0 points) - No FNA or follow-up. ACR TI-RADS recommends that no more than two nodules with the highest ACR TI-RADS total point should be biopsied and no more than four nodules should be followed. Soft tissues: No regional cervical adenopathy identified. IMPRESSION: 1. Moderately heterogeneous thyroid  parenchyma. 2. Multiple nodules. None meet criteria for biopsy or follow-up. Electronically signed by: Katheleen Faes MD 07/18/2024 03:36 PM EDT RP Workstation: [id number]       Nydia Distance M.D. [facility] 07/19/2024, 1:22 PM  Available via Epic secure chat 7am-7pm After 7 pm, please refer to night coverage provider listed on amion.

## 2024-07-20 ENCOUNTER — Encounter (HOSPITAL_COMMUNITY)
Admission: EM | Disposition: A | Discharge status: Home / Self Care | Payer: Self-pay | Source: Home / Self Care | Attending: Internal Medicine

## 2024-07-20 ENCOUNTER — Other Ambulatory Visit: Payer: Self-pay

## 2024-07-20 DIAGNOSIS — I2510 Atherosclerotic heart disease of native coronary artery without angina pectoris: Secondary | ICD-10-CM | POA: Diagnosis not present

## 2024-07-20 DIAGNOSIS — J9601 Acute respiratory failure with hypoxia: Secondary | ICD-10-CM | POA: Diagnosis not present

## 2024-07-20 DIAGNOSIS — I350 Nonrheumatic aortic (valve) stenosis: Secondary | ICD-10-CM | POA: Diagnosis not present

## 2024-07-20 DIAGNOSIS — J441 Chronic obstructive pulmonary disease with (acute) exacerbation: Secondary | ICD-10-CM | POA: Diagnosis not present

## 2024-07-20 HISTORY — PX: RIGHT HEART CATH: CATH118263

## 2024-07-20 LAB — RENAL FUNCTION PANEL
Albumin: 3.5 g/dL (ref 3.5–5.0)
Anion gap: 16 — ABNORMAL HIGH (ref 5–15)
BUN: 42 mg/dL — ABNORMAL HIGH (ref 8–23)
CO2: 27 mmol/L (ref 22–32)
Calcium: 10.1 mg/dL (ref 8.9–10.3)
Chloride: 98 mmol/L (ref 98–111)
Creatinine, Ser: 1.47 mg/dL — ABNORMAL HIGH (ref 0.44–1.00)
GFR, Estimated: 37 mL/min — ABNORMAL LOW (ref >60)
Glucose, Bld: 112 mg/dL — ABNORMAL HIGH (ref 70–99)
Phosphorus: 4.1 mg/dL (ref 2.5–4.6)
Potassium: 4.2 mmol/L (ref 3.5–5.1)
Sodium: 140 mmol/L (ref 135–145)

## 2024-07-20 LAB — CBC
HCT: 37.3 % (ref 36.0–46.0)
Hemoglobin: 12.4 g/dL (ref 12.0–15.0)
MCH: 29.9 pg (ref 26.0–34.0)
MCHC: 33.2 g/dL (ref 30.0–36.0)
MCV: 89.9 fL (ref 80.0–100.0)
Platelets: 274 K/uL (ref 150–400)
RBC: 4.15 MIL/uL (ref 3.87–5.11)
RDW: 12.8 % (ref 11.5–15.5)
WBC: 11.9 K/uL — ABNORMAL HIGH (ref 4.0–10.5)
nRBC: 0 % (ref 0.0–0.2)

## 2024-07-20 SURGERY — RIGHT HEART CATH
Anesthesia: LOCAL

## 2024-07-20 MED ORDER — HEPARIN (PORCINE) IN NACL 1000-0.9 UT/500ML-% IV SOLN
INTRAVENOUS | Status: DC | PRN
  Administered 2024-07-20 (×2): 500 mL

## 2024-07-20 MED ORDER — MIDAZOLAM HCL 2 MG/2ML IJ SOLN
INTRAMUSCULAR | Status: DC | PRN
  Administered 2024-07-20: 1 mg via INTRAVENOUS

## 2024-07-20 MED ORDER — SODIUM CHLORIDE 0.9% FLUSH
3.0000 mL | Freq: Two times a day (BID) | INTRAVENOUS | Status: DC
  Administered 2024-07-20 – 2024-07-24 (×7): 3 mL via INTRAVENOUS

## 2024-07-20 MED ORDER — LIDOCAINE HCL (PF) 1 % IJ SOLN
INTRAMUSCULAR | Status: DC | PRN
  Administered 2024-07-20: 5 mL
  Administered 2024-07-20 (×2): 2 mL

## 2024-07-20 MED ORDER — ASPIRIN 81 MG PO CHEW: 81.0000 mg | CHEWABLE_TABLET | ORAL | Status: DC

## 2024-07-20 MED ORDER — VERAPAMIL HCL 2.5 MG/ML IV SOLN: INTRAVENOUS | Status: DC | PRN

## 2024-07-20 MED ORDER — IOHEXOL 350 MG/ML SOLN
INTRAVENOUS | Status: DC | PRN
  Administered 2024-07-20: 50 mL

## 2024-07-20 MED ORDER — FREE WATER: 250.0000 mL | Freq: Once | Status: DC

## 2024-07-20 MED ORDER — MIDAZOLAM HCL 2 MG/2ML IJ SOLN
INTRAMUSCULAR | Status: AC
  Filled 2024-07-20: qty 2

## 2024-07-20 MED ORDER — HYDRALAZINE HCL 20 MG/ML IJ SOLN: 10.0000 mg | INTRAMUSCULAR | Status: DC | PRN

## 2024-07-20 MED ORDER — LABETALOL HCL 5 MG/ML IV SOLN: 10.0000 mg | INTRAVENOUS | Status: DC | PRN

## 2024-07-20 MED ORDER — FENTANYL CITRATE (PF) 100 MCG/2ML IJ SOLN
INTRAMUSCULAR | Status: DC | PRN
  Administered 2024-07-20: 25 ug via INTRAVENOUS

## 2024-07-20 MED ORDER — FENTANYL CITRATE (PF) 100 MCG/2ML IJ SOLN
INTRAMUSCULAR | Status: AC
  Filled 2024-07-20: qty 2

## 2024-07-20 MED ORDER — ACETAMINOPHEN 325 MG PO TABS: 650.0000 mg | ORAL_TABLET | ORAL | Status: DC | PRN

## 2024-07-20 MED ORDER — LIDOCAINE HCL (PF) 1 % IJ SOLN
INTRAMUSCULAR | Status: AC
  Filled 2024-07-20: qty 30

## 2024-07-20 SURGICAL SUPPLY — 15 items
CATH INFINITI 5FR MULTPACK ANG (CATHETERS) IMPLANT
CATH SWAN GANZ 7F STRAIGHT (CATHETERS) IMPLANT
CLOSURE MYNX CONTROL 5F (Vascular Products) IMPLANT
CLOSURE MYNX CONTROL 6F/7F (Vascular Products) IMPLANT
GLIDESHEATH SLEND SS 6F .021 (SHEATH) IMPLANT
GLIDESHEATH SLENDER 7FR .021G (SHEATH) IMPLANT
GUIDEWIRE INQWIRE 1.5J.035X260 (WIRE) IMPLANT
KIT MICROPUNCTURE NIT STIFF (SHEATH) IMPLANT
PACK CARDIAC CATHETERIZATION (CUSTOM PROCEDURE TRAY) ×1 IMPLANT
SET ATX-X65L (MISCELLANEOUS) IMPLANT
SHEATH PINNACLE 5F 10CM (SHEATH) IMPLANT
SHEATH PINNACLE 7F 10CM (SHEATH) IMPLANT
SHEATH PROBE COVER 6X72 (BAG) IMPLANT
WIRE HI TORQ VERSACORE-J 145CM (WIRE) IMPLANT
WIRE MICROINTRODUCER 60CM (WIRE) IMPLANT

## 2024-07-20 NOTE — Progress Notes (Signed)
 Patient left with [facility] for right heart catheterization at [facility]. Informed Consent for procedure sent with [facility].

## 2024-07-20 NOTE — Progress Notes (Addendum)
 Physical Therapy Treatment Patient Details Name: Katie [name] MRN: [id number] DOB: [birth year] Today's Date: 07/20/2024   History of Present Illness 77 yo female admitted with acute respiratory failure, COPD exac, acute on chronic HF, bil pleural effuisons. Hx of CKD, severe MVRs/p repair 2020, AAA s/p stent graft 2023, cocaine use, HF, COPD, renal cell ca, CAD    PT Comments  Pt agreeable to working with therapy. O2: 93% on RA at rest; 89-90% on RA, HR 128 bpm, dyspnea 2/4 with ambulation. Pt did request to have [place] O2 back at end of session to aid recovery. Will continue to follow and progress activity as tolerated. Recommend HHPT and rollator for ambulation safety/energy conservation.    If plan is discharge home, recommend the following: A little help with walking and/or transfers;A little help with bathing/dressing/bathroom;Assistance with cooking/housework;Assist for transportation;Help with stairs or ramp for entrance   Can travel by private vehicle        Equipment Recommendations  Rollator (4 wheels)    Recommendations for Other Services       Precautions / Restrictions Precautions Precautions: Fall Precaution/Restrictions Comments: monitor O2, HR Restrictions Weight Bearing Restrictions Per Provider Order: No     Mobility  Bed Mobility Overal bed mobility: Modified Independent                  Transfers Overall transfer level: Modified independent Equipment used: Rollator (4 wheels)                    Ambulation/Gait Ambulation/Gait assistance: Supervision Gait Distance (Feet): 135 Feet Assistive device: Rollator (4 wheels) Gait Pattern/deviations: Step-through pattern, Decreased stride length       General Gait Details: Supv for safety. O2 89-90% on RA, dyspnea 2/4, HR 125 bpm.   Stairs             Wheelchair Mobility     Tilt Bed    Modified Rankin (Stroke Patients Only)       Balance Overall balance assessment: Mild  deficits observed, not formally tested                                          Communication Communication Communication: No apparent difficulties  Cognition Arousal: Alert Behavior During Therapy: WFL for tasks assessed/performed   PT - Cognitive impairments: No apparent impairments                         Following commands: Intact      Cueing Cueing Techniques: Verbal cues  Exercises      General Comments        Pertinent Vitals/Pain Pain Assessment Pain Assessment: No/denies pain    Home Living                          Prior Function            PT Goals (current goals can now be found in the care plan section) Progress towards PT goals: Progressing toward goals    Frequency    Min 3X/week      PT Plan      Co-evaluation              AM-PAC PT 6 Clicks Mobility   Outcome Measure  Help needed turning from your back to your side while in a  flat bed without using bedrails?: None Help needed moving from lying on your back to sitting on the side of a flat bed without using bedrails?: None Help needed moving to and from a bed to a chair (including a wheelchair)?: None Help needed standing up from a chair using your arms (e.g., wheelchair or bedside chair)?: None Help needed to walk in hospital room?: A Little Help needed climbing 3-5 steps with a railing? : A Little 6 Click Score: 22    End of Session Equipment Utilized During Treatment: Oxygen  Activity Tolerance: Patient tolerated treatment well Patient left: in bed;with call bell/phone within reach;with bed alarm set   PT Visit Diagnosis: Muscle weakness (generalized) (M62.81);Difficulty in walking, not elsewhere classified (R26.2);Unsteadiness on feet (R26.81)     Time: 1000-1010 PT Time Calculation (min) (ACUTE ONLY): 10 min  Charges:    $Gait Training: 8-22 mins PT General Charges $$ ACUTE PT VISIT: 1 Visit                        Dannial SQUIBB, PT Acute Rehabilitation  Office: [phone]

## 2024-07-20 NOTE — H&P (View-Only) (Signed)
  Progress Note  Patient Name: Katie [name] Date of Encounter: 07/20/2024 [facility] Cardiologist: Annabella Scarce, MD   Interval Summary   No events overnight. She reports a lot of urine output and maybe improvement in her breath. She had significantly increased work of breathing during interview but had just walked with PT.   Vital Signs Vitals:   07/20/24 0500 07/20/24 0513 07/20/24 0621 07/20/24 0740  BP:  (!) 132/96 (!) 143/110   Pulse:  96 94   Resp:  18  (!) 23  Temp:  97.7 F (36.5 C)    TempSrc:  Oral    SpO2:  100%  95%  Weight: 57.7 kg     Height:        Intake/Output Summary (Last 24 hours) at 07/20/2024 1031 Last data filed at 07/20/2024 0800 Gross per 24 hour  Intake 1069.76 ml  Output 1900 ml  Net -830.24 ml      07/20/2024    5:00 AM 07/19/2024    5:33 AM 07/17/2024    3:03 PM  Last 3 Weights  Weight (lbs) 127 lb 1.6 oz 131 lb 13.4 oz 141 lb 8.6 oz  Weight (kg) 57.652 kg 59.8 kg 64.2 kg      Telemetry/ECG  NSR - Personally Reviewed  Physical Exam  GEN: No acute distress.   Neck: No JVD Cardiac: RRR, no murmurs, rubs, or gallops.  Respiratory:  Clear to auscultation bilaterally w/ significant both inspiratory and expiratory wheezing  GI: Soft, nontender, non-distended  Katie: No edema  Assessment & Plan  Katie [name] is a 43 yoF with Hx of MVP c/b severe MR s/p MV repair (2020), non-obstructive CAD, cocaine use, carotid disease, HFpEF, AAA s/p stent 2023, and CKDIII presenting with COPD exacerbation and HFpEF found to have MG 13 mmHg on TTE and significant LVOT gradient.   #COPD exacerbation #HFpEF exacerbation #Severe mitral stenosis #Significant LVOT gradient w/ valsalva #Non-obstructive CAD #Post op Afib not AC #Ongoing cocaine use #HTN - Presented with COPD and HFpEF exacerbation likely in setting of cocaine use. TTE with severe Katie and significant LVOT gradient. Trop/ECG unremarkable. CTPE with no PE.  - ABG and lactate on 9/30  were unremarkable and no edema on chest x-ray - D/C IV diuresis given worsening AKI - Patient does not appears significantly volume overload on exam but continues to have significantly increased work of breathing with minimal exertion and significant wheezing. Unclear whether this is all pulmonary or the Katie is playing a role as well. Will obtain RHC to get a better picture.  - Will need TEE once stable from respiratory standpoint - Informed Consent   Shared Decision Making/Informed Consent The risks, including but not limited to, [bleeding or vascular complications (1 in 500), pneumothorax (1 in 1600), arrhythmia (1 in 1000) and death (1 in 5000)], benefits (diagnostic support and/or management of heart failure, pulmonary hypertension) and alternatives of a right heart catheterization were discussed in detail with Katie [name] and she is willing to proceed.      For questions or updates, please contact [facility] Please consult [email or web address] for contact info under   Signed, Katie VEAR Ren Donley, MD

## 2024-07-20 NOTE — Progress Notes (Signed)
  Progress Note  Patient Name: Katie [name] Date of Encounter: 07/20/2024 [facility] Cardiologist: Katie Scarce, MD   Interval Summary   No events overnight. She reports a lot of urine output and maybe improvement in her breath. She had significantly increased work of breathing during interview but had just walked with PT.   Vital Signs Vitals:   07/20/24 0500 07/20/24 0513 07/20/24 0621 07/20/24 0740  BP:  (!) 132/96 (!) 143/110   Pulse:  96 94   Resp:  18  (!) 23  Temp:  97.7 F (36.5 C)    TempSrc:  Oral    SpO2:  100%  95%  Weight: 57.7 kg     Height:        Intake/Output Summary (Last 24 hours) at 07/20/2024 1031 Last data filed at 07/20/2024 0800 Gross per 24 hour  Intake 1069.76 ml  Output 1900 ml  Net -830.24 ml      07/20/2024    5:00 AM 07/19/2024    5:33 AM 07/17/2024    3:03 PM  Last 3 Weights  Weight (lbs) 127 lb 1.6 oz 131 lb 13.4 oz 141 lb 8.6 oz  Weight (kg) 57.652 kg 59.8 kg 64.2 kg      Telemetry/ECG  NSR - Personally Reviewed  Physical Exam  GEN: No acute distress.   Neck: No JVD Cardiac: RRR, no murmurs, rubs, or gallops.  Respiratory:  Clear to auscultation bilaterally w/ significant both inspiratory and expiratory wheezing  GI: Soft, nontender, non-distended  MS: No edema  Assessment & Plan  Ms Katie [name] is a 43 yoF with Hx of MVP c/b severe MR s/p MV repair (2020), non-obstructive CAD, cocaine use, carotid disease, HFpEF, AAA s/p stent 2023, and CKDIII presenting with COPD exacerbation and HFpEF found to have MG 13 mmHg on TTE and significant LVOT gradient.   #COPD exacerbation #HFpEF exacerbation #Severe mitral stenosis #Significant LVOT gradient w/ valsalva #Non-obstructive CAD #Post op Afib not AC #Ongoing cocaine use #HTN - Presented with COPD and HFpEF exacerbation likely in setting of cocaine use. TTE with severe MS and significant LVOT gradient. Trop/ECG unremarkable. CTPE with no PE.  - ABG and lactate on 9/30  were unremarkable and no edema on chest x-ray - D/C IV diuresis given worsening AKI - Patient does not appears significantly volume overload on exam but continues to have significantly increased work of breathing with minimal exertion and significant wheezing. Unclear whether this is all pulmonary or the MS is playing a role as well. Will obtain RHC to get a better picture.  - Will need TEE once stable from respiratory standpoint - Informed Consent   Shared Decision Making/Informed Consent The risks, including but not limited to, [bleeding or vascular complications (1 in 500), pneumothorax (1 in 1600), arrhythmia (1 in 1000) and death (1 in 5000)], benefits (diagnostic support and/or management of heart failure, pulmonary hypertension) and alternatives of a right heart catheterization were discussed in detail with [name] and she is willing to proceed.      For questions or updates, please contact [facility] Please consult [email or web address] for contact info under   Signed, [name] Ren Donley, MD

## 2024-07-20 NOTE — Progress Notes (Signed)
 [facility]                                                                              Katie [name], is a 78 y.o. female, DOB - 1946/10/01, FMW:[id number] Admit date - 07/16/2024    Outpatient Primary MD for the patient is [name], MD  LOS - 4  days  Chief Complaint  Patient presents with   Shortness of Breath       Brief summary   Patient is a 78 year old female with history of COPD, tobacco abuse, cocaine use, diastolic CHF, hypertension, hyperlipidemia, CKD stage IIIa, CAD, history of severe MR status post mitral valve repair in 04/2019, AAA status post EVAR 01/2022 presented with shortness of breath, respiratory dress, cough, increased wheezing for last 3 days.  Patient reported acute worsening at the time of admission with a chest tightness.  Patient was found to be tachypneic, tachycardiac, respiratory distress, lactic acidosis, placed on BiPAP in ED Chest x-ray showed hazy opacities in the right middle and lower lung suspicious for pneumonia  Assessment & Plan      Acute respiratory failure with hypoxia (HCC) -Multifactorial likely due to viral pneumonia, COPD exacerbation, acute on chronic diastolic CHF - CTA chest negative for PE, showed small bilateral pleural effusions, emphysema, Aortic atherosclerotic classification.  - Wean off O2 as able -RVP- rhinovirus    COPD exacerbation (HCC),    CAP (community acquired pneumonia) - Wean O2 as tolerated - Continue Xopenex , Atrovent  nebs, Pulmicort , Brovana, Yupelri, IV Solu-Medrol . - Continue IV Rocephin , doxycycline  - Nebs changed to Xopenex  due to tachycardia  Acute on chronic diastolic CHF, history of CAD, severe MR s/p mitral valve repair in 04/2019 - BNP 7015, received IV Lasix  in ED.  - 2D echo EF 75%, no regional WMA, indeterminate diastolic Parameters. -  UDS positive for cocaine, counseled patient on cocaine cessation, holding off on beta-blocker - Continue Lasix  40 mg IV 3 times daily,  still positive balance of 322 - Strict I's and O's and daily weights - Cardiology following, will need TEE once stable from respiratory standpoint  Lactic acidosis, anion gap metabolic acidosis -Unclear etiology, ABG did not show any respiratory acidosis, no GI losses, not on any medications causing acidosis.  Does not appear to be from sepsis.  -Likely from cocaine.   Hyperthyroidism - New diagnosis, TSH 0.28, free T4 1.34, T3 80 - add methimazole 5 mg twice daily - TPO Ab, TSH receptor antibody, ESR 60.  - Thyroid  US  showed moderately heterogeneous thyroid  parenchyma, multiple nodules, none meet criteria for biopsy, may need RAI uptake scan for further workup outpatient.   - Outpatient referral to endocrinology at discharge for further workup per Dr. Davia  Normocytic anemia/iron deficiency - Hemoglobin 11.0 on admission, trended down to 9.3 on 9/28 -Anemia panel showed iron 17, percent saturation ratio 5, ferritin 97, FOBT pending - H&H is stable/improving - Recommend outpatient GI workup for iron deficiency anemia  Essential hypertension - Beta-blocker discontinued due to prior history of cocaine use - Continue IV Lasix     AAA (abdominal aortic aneurysm) without rupture - Continue BP control  HLD (hyperlipidemia) -Continue Crestor     Tobacco abuse disorder -Continue nicotine  patch    CKD stage G3a/A1, GFR 45-59 and albumin  creatinine ratio <30 mg/g (HCC) - Cr at baseline -Lasix  DC'd     QT prolongation -Replace K magnesium  3.0, avoid QT prolonging meds   Estimated body mass index is 24.02 kg/m as calculated from the following:   Height as of this encounter: 5' 1 (1.549 m).   Weight as of this encounter: 57.7 kg.  Code Status: DNR DVT Prophylaxis:  SCDs Start: 07/16/24 2149   Level of Care: Level of care: Progressive Family Communication: No family at bedside Disposition Plan:      Remains inpatient appropriate:      Procedures:    Consultants:    Cardiology     Medications  arformoterol  15 mcg Nebulization BID   budesonide  (PULMICORT ) nebulizer solution  0.25 mg Nebulization BID   hydrALAZINE   10 mg Oral Q8H   levalbuterol   0.63 mg Nebulization Q6H   And   ipratropium  0.5 mg Nebulization Q6H   loratadine  10 mg Oral Daily   losartan   50 mg Oral Daily   methIMAzole  5 mg Oral BID   methylPREDNISolone  (SOLU-MEDROL ) injection  40 mg Intravenous Q12H   nicotine   14 mg Transdermal Daily   pantoprazole   40 mg Oral Daily   polyethylene glycol  17 g Oral Daily   revefenacin  175 mcg Nebulization Daily   rosuvastatin   40 mg Oral Daily   senna-docusate  1 tablet Oral BID   sodium chloride  flush  3 mL Intravenous Q12H      Subjective:   Katie [name] was seen and examined today.  Per patient, breathing has not much improved, needed BiPAP this morning however mask was not fitting and leaking.  Still has wheezing.  No fever chills, chest pain.  No nausea vomiting or abdominal, eating breakfast during encounter.  Sister at the bedside.  Objective:   Vitals:   07/20/24 0500 07/20/24 0513 07/20/24 0621 07/20/24 0740  BP:  (!) 132/96 (!) 143/110   Pulse:  96 94   Resp:  18  (!) 23  Temp:  97.7 F (36.5 C)    TempSrc:  Oral    SpO2:  100%  95%  Weight: 57.7 kg     Height:        Intake/Output Summary (Last 24 hours) at 07/20/2024 1135 Last data filed at 07/20/2024 0800 Gross per 24 hour  Intake 829.76 ml  Output 1900 ml  Net -1070.24 ml     Wt Readings from Last 3 Encounters:  07/20/24 57.7 kg  06/01/24 63.5 kg  05/04/24 63.5 kg   Physical Exam  General: Appearance:    Well developed, well nourished female in no acute distress     Lungs:     Clear to auscultation bilaterally, respirations unlabored  Heart:    Normal heart rate.     MS:   All extremities are intact.   Neurologic:   Awake, alert    Data Reviewed:  I have personally reviewed following labs    CBC Lab Results  Component Value Date    WBC 11.9 (H) 07/20/2024   RBC 4.15 07/20/2024   HGB 12.4 07/20/2024   HCT 37.3 07/20/2024   MCV 89.9 07/20/2024   MCH 29.9 07/20/2024   PLT 274 07/20/2024   MCHC 33.2 07/20/2024   RDW 12.8 07/20/2024   LYMPHSABS 1.2 07/16/2024   MONOABS 0.7 07/16/2024  EOSABS 0.1 07/16/2024   BASOSABS 0.0 07/16/2024     Last metabolic panel Lab Results  Component Value Date   NA 140 07/20/2024   K 4.2 07/20/2024   CL 98 07/20/2024   CO2 27 07/20/2024   BUN 42 (H) 07/20/2024   CREATININE 1.47 (H) 07/20/2024   GLUCOSE 112 (H) 07/20/2024   GFRNONAA 37 (L) 07/20/2024   GFRAA 37 (L) 04/26/2020   CALCIUM  10.1 07/20/2024   PHOS 4.1 07/20/2024   PROT 6.3 (L) 07/17/2024   ALBUMIN  3.5 07/20/2024   BILITOT 0.2 07/17/2024   ALKPHOS 71 07/17/2024   AST 24 07/17/2024   ALT 11 07/17/2024   ANIONGAP 16 (H) 07/20/2024    CBG (last 3)  No results for input(s): GLUCAP in the last 72 hours.    Coagulation Profile: Recent Labs  Lab 07/16/24 1940  INR 1.0     Radiology Studies: I have personally reviewed the imaging studies  DG ABD ACUTE 2+V W 1V CHEST Result Date: 07/19/2024 CLINICAL DATA:  Chronic diastolic heart failure. EXAM: DG ABDOMEN ACUTE WITH 1 VIEW CHEST COMPARISON:  July 16, 2024. FINDINGS: Mildly dilated small bowel loops are noted in the upper abdomen suggesting ileus or possible obstruction. No definite colonic dilatation is noted. Moderate stool seen in right colon. No radiopaque calculi. Status post cholecystectomy and aortic stent graft placement. Phleboliths are noted in the pelvis. Heart size and mediastinal contours are within normal limits. Right infrahilar atelectasis or infiltrate is noted. IMPRESSION: 1. Mildly dilated small bowel loops are noted in the upper abdomen suggesting ileus or possible obstruction. 2. Right infrahilar atelectasis or infiltrate is noted. Electronically Signed   By: Lynwood Landy Raddle M.D.   On: 07/19/2024 12:19   US  THYROID  Result Date:  07/18/2024 EXAM: US  THYROID  07/18/2024 03:15:25 PM TECHNIQUE: Real-time ultrasound scan of the thyroid  gland and soft tissues of the neck with image documentation. COMPARISON: None available. CLINICAL HISTORY: 01547 Hyperthyroidism 98452. Hyperthyroidism FINDINGS: Right thyroid  lobe: 4.1 x 1.4 x 1.8 cm Left thyroid  lobe: 3.4 x 1.3 x 1.3 cm Isthmus: 0.2 cm in thickness Echotexture: Parenchyma echotexture moderately heterogeneous. Vascularity: Normal Vascularity. Thyroid  Nodules: Nodule 1: Mid right, 1.2 x 0.8 x 1 cm, Composition: mostly solid (2 pts), Echogenicity: isoechoic (1 pt), Shape: Wider than tall (0 pts), Margin: Smooth (0 pts), Echogenic Foci: None (0 pts), TI-RADS category: TR3. Nodule 2: Inferior right, 1.3 x 0.3 x 0.6 cm, Composition: complex cyst (Mixed cystic and solid) (1 pt), Echogenicity: Isoechoic (1 pt), Shape: Wider than tall (0 pts), Margin: Smooth (0 pts), Echogenic Foci: None (0 pts), TI-RADS category: TR2. Nodule 3: Superior left, 0.6 x 0.6 x 0.5 cm, Composition: complex cyst (Mixed cystic and solid) (1 pt), Echogenicity: Isoechoic (1 pt), Shape: Wider than tall (0 pts), Margin: Smooth (0 pts), Echogenic Foci: None (0 pts), TI-RADS category: TR2. ACR TI-RADS recommendations: TR5 (7 or more points) - FNA if greater than or equal to 1 cm; if 0.5 - 0.9 cm follow-up every year for 5 years. TR4 (4-6 points) - FNA if greater than or equal to 1.5 cm; if 1 - 1.4 cm follow-up in 1, 2, 3 and 5 years. TR3 (3 points) - FNA if greater than or equal to 2.5 cm; if 1.5 - 2.4 cm follow-up in 1, 3 and 5 years. TR2 (2 points) and TR1 (0 points) - No FNA or follow-up. ACR TI-RADS recommends that no more than two nodules with the highest ACR TI-RADS total point should be biopsied  and no more than four nodules should be followed. Soft tissues: No regional cervical adenopathy identified. IMPRESSION: 1. Moderately heterogeneous thyroid  parenchyma. 2. Multiple nodules. None meet criteria for biopsy or follow-up.  Electronically signed by: Dayne Hassell MD 07/18/2024 03:36 PM EDT RP Workstation: [id number]       Harlene RAYMOND Bowl DO [facility] 07/20/2024, 11:35 AM  Available via Epic secure chat 7am-7pm After 7 pm, please refer to night coverage provider listed on amion.

## 2024-07-20 NOTE — Interval H&P Note (Signed)
 History and Physical Interval Note:  07/20/2024 4:26 PM  Katie [name]  has presented today for surgery, with the diagnosis of HF and mitral stenosis.  The various methods of treatment have been discussed with the patient and family. After consideration of risks, benefits and other options for treatment, the patient has consented to  Procedure(s): RIGHT HEART CATH (N/A) left heart cath and coronary angiography as a surgical intervention.  The patient's history has been reviewed, patient examined, no change in status, stable for surgery.  I have reviewed the patient's chart and labs.  Questions were answered to the patient's satisfaction.     Leah Skora

## 2024-07-20 NOTE — Progress Notes (Signed)
 Received from [employer] by [employer]. Alert and oriented. Denies discomfort. On O2 2L/[place]

## 2024-07-20 NOTE — Progress Notes (Signed)
 Spoke with primary RN, patient is not available for PICC placement. Patient is at [facility] for heart cath. Will follow up later.

## 2024-07-21 ENCOUNTER — Encounter (HOSPITAL_COMMUNITY): Payer: Self-pay | Admitting: Internal Medicine

## 2024-07-21 ENCOUNTER — Other Ambulatory Visit: Payer: Self-pay

## 2024-07-21 DIAGNOSIS — J9601 Acute respiratory failure with hypoxia: Secondary | ICD-10-CM | POA: Diagnosis not present

## 2024-07-21 DIAGNOSIS — J189 Pneumonia, unspecified organism: Secondary | ICD-10-CM | POA: Diagnosis not present

## 2024-07-21 DIAGNOSIS — I471 Supraventricular tachycardia, unspecified: Secondary | ICD-10-CM | POA: Diagnosis not present

## 2024-07-21 DIAGNOSIS — J441 Chronic obstructive pulmonary disease with (acute) exacerbation: Secondary | ICD-10-CM | POA: Diagnosis not present

## 2024-07-21 LAB — POCT I-STAT EG7
Acid-Base Excess: 8 mmol/L — ABNORMAL HIGH (ref 0.0–2.0)
Acid-Base Excess: 9 mmol/L — ABNORMAL HIGH (ref 0.0–2.0)
Bicarbonate: 33.6 mmol/L — ABNORMAL HIGH (ref 20.0–28.0)
Bicarbonate: 34.5 mmol/L — ABNORMAL HIGH (ref 20.0–28.0)
Calcium, Ion: 1.17 mmol/L (ref 1.15–1.40)
Calcium, Ion: 1.21 mmol/L (ref 1.15–1.40)
HCT: 36 % (ref 36.0–46.0)
HCT: 36 % (ref 36.0–46.0)
Hemoglobin: 12.2 g/dL (ref 12.0–15.0)
Hemoglobin: 12.2 g/dL (ref 12.0–15.0)
O2 Saturation: 72 %
O2 Saturation: 74 %
Potassium: 3.9 mmol/L (ref 3.5–5.1)
Potassium: 4 mmol/L (ref 3.5–5.1)
Sodium: 136 mmol/L (ref 135–145)
Sodium: 137 mmol/L (ref 135–145)
TCO2: 35 mmol/L — ABNORMAL HIGH (ref 22–32)
TCO2: 36 mmol/L — ABNORMAL HIGH (ref 22–32)
pCO2, Ven: 50.7 mmHg (ref 44–60)
pCO2, Ven: 52.4 mmHg (ref 44–60)
pH, Ven: 7.427 (ref 7.25–7.43)
pH, Ven: 7.429 (ref 7.25–7.43)
pO2, Ven: 38 mmHg (ref 32–45)
pO2, Ven: 39 mmHg (ref 32–45)

## 2024-07-21 LAB — POCT I-STAT 7, (LYTES, BLD GAS, ICA,H+H)
Acid-Base Excess: 7 mmol/L — ABNORMAL HIGH (ref 0.0–2.0)
Bicarbonate: 31 mmol/L — ABNORMAL HIGH (ref 20.0–28.0)
Calcium, Ion: 1.08 mmol/L — ABNORMAL LOW (ref 1.15–1.40)
HCT: 35 % — ABNORMAL LOW (ref 36.0–46.0)
Hemoglobin: 11.9 g/dL — ABNORMAL LOW (ref 12.0–15.0)
O2 Saturation: 100 %
Potassium: 3.7 mmol/L (ref 3.5–5.1)
Sodium: 139 mmol/L (ref 135–145)
TCO2: 32 mmol/L (ref 22–32)
pCO2 arterial: 42.1 mmHg (ref 32–48)
pH, Arterial: 7.475 — ABNORMAL HIGH (ref 7.35–7.45)
pO2, Arterial: 205 mmHg — ABNORMAL HIGH (ref 83–108)

## 2024-07-21 LAB — EXPECTORATED SPUTUM ASSESSMENT W GRAM STAIN, RFLX TO RESP C: Special Requests: NORMAL

## 2024-07-21 MED ORDER — IPRATROPIUM BROMIDE 0.02 % IN SOLN
0.5000 mg | Freq: Four times a day (QID) | RESPIRATORY_TRACT | Status: DC
Start: 2024-07-21 — End: 2024-07-22
  Administered 2024-07-21 – 2024-07-22 (×4): 0.5 mg via RESPIRATORY_TRACT
  Filled 2024-07-21 (×4): qty 2.5

## 2024-07-21 MED ORDER — PREDNISONE 50 MG PO TABS
50.0000 mg | ORAL_TABLET | Freq: Once | ORAL | Status: AC
  Administered 2024-07-21: 50 mg via ORAL
  Filled 2024-07-21: qty 1

## 2024-07-21 MED ORDER — DILTIAZEM HCL 30 MG PO TABS
30.0000 mg | ORAL_TABLET | Freq: Four times a day (QID) | ORAL | Status: DC
  Administered 2024-07-21 – 2024-07-22 (×4): 30 mg via ORAL
  Filled 2024-07-21 (×4): qty 1

## 2024-07-21 MED ORDER — HYDROCODONE BIT-HOMATROP MBR 5-1.5 MG/5ML PO SOLN
5.0000 mL | Freq: Four times a day (QID) | ORAL | Status: DC | PRN
  Administered 2024-07-21 – 2024-07-24 (×7): 5 mL via ORAL
  Filled 2024-07-21 (×8): qty 5

## 2024-07-21 MED ORDER — SODIUM CHLORIDE 0.9% FLUSH
10.0000 mL | Freq: Two times a day (BID) | INTRAVENOUS | Status: DC
  Administered 2024-07-24 – 2024-07-25 (×3): 10 mL

## 2024-07-21 MED ORDER — ENOXAPARIN SODIUM 30 MG/0.3ML IJ SOSY
30.0000 mg | PREFILLED_SYRINGE | INTRAMUSCULAR | Status: DC
  Administered 2024-07-21: 30 mg via SUBCUTANEOUS
  Filled 2024-07-21: qty 0.3

## 2024-07-21 MED ORDER — AMIODARONE HCL IN DEXTROSE 360-4.14 MG/200ML-% IV SOLN
60.0000 mg/h | INTRAVENOUS | Status: DC
  Filled 2024-07-21: qty 200

## 2024-07-21 MED ORDER — LEVALBUTEROL HCL 0.63 MG/3ML IN NEBU
0.6300 mg | INHALATION_SOLUTION | Freq: Three times a day (TID) | RESPIRATORY_TRACT | Status: DC
  Administered 2024-07-21 – 2024-07-22 (×4): 0.63 mg via RESPIRATORY_TRACT
  Filled 2024-07-21 (×4): qty 3

## 2024-07-21 MED ORDER — DILTIAZEM HCL 25 MG/5ML IV SOLN: 10.0000 mg | Freq: Two times a day (BID) | INTRAVENOUS | Status: DC | PRN

## 2024-07-21 MED ORDER — AMIODARONE LOAD VIA INFUSION
150.0000 mg | Freq: Once | INTRAVENOUS | Status: DC
  Filled 2024-07-21: qty 83.34

## 2024-07-21 MED ORDER — DILTIAZEM HCL 25 MG/5ML IV SOLN
5.0000 mg | INTRAVENOUS | Status: AC
  Administered 2024-07-21: 10 mg via INTRAVENOUS
  Filled 2024-07-21: qty 5

## 2024-07-21 MED ORDER — CHLORHEXIDINE GLUCONATE CLOTH 2 % EX PADS
6.0000 | MEDICATED_PAD | Freq: Every day | CUTANEOUS | Status: DC
  Administered 2024-07-21 – 2024-07-25 (×5): 6 via TOPICAL

## 2024-07-21 MED ORDER — SODIUM CHLORIDE 0.9% FLUSH: 10.0000 mL | INTRAVENOUS | Status: DC | PRN

## 2024-07-21 MED ORDER — SODIUM CHLORIDE 0.9 % IV BOLUS
500.0000 mL | Freq: Once | INTRAVENOUS | Status: AC
  Administered 2024-07-21: 500 mL via INTRAVENOUS

## 2024-07-21 MED ORDER — GUAIFENESIN ER 600 MG PO TB12
600.0000 mg | ORAL_TABLET | Freq: Two times a day (BID) | ORAL | Status: DC
  Administered 2024-07-21 – 2024-07-23 (×5): 600 mg via ORAL
  Filled 2024-07-21 (×5): qty 1

## 2024-07-21 MED ORDER — AMIODARONE HCL IN DEXTROSE 360-4.14 MG/200ML-% IV SOLN
30.0000 mg/h | INTRAVENOUS | Status: DC
  Filled 2024-07-21: qty 200

## 2024-07-21 NOTE — Consult Note (Signed)
 NAME:  Katie [name], MRN:  [id number], DOB:  Feb 18, 1946, LOS: 5 ADMISSION DATE:  07/16/2024, CONSULTATION DATE:  07/21/2024  REFERRING MD:  Vann, TRH, CHIEF COMPLAINT: Wheezing, hypoxia  History of Present Illness:  78 year old smoker with severe COPD who follows with my partner Dr. Annella, last seen 02/2023. PFT 03/2019 F showed severe COPD with FEV1 of 33% and hyperinflation with TLC 135% She was admitted with 3 days of coughing and wheezing with shortness of breath Initial labs showed no leukocytosis, lactate of 3.8, proBNP 7015.  Chest x-ray showed hazy opacities right middle and lower lung.  Treated with ceftriaxone  and doxycycline . CT angiogram chest surprisingly showed severe emphysema but no evidence of pneumonia, stable calcified pleural plaque at the right lung base UDS was positive for cocaine.  Seen by cardiology and underwent LHC/right heart cath which showed only mild CAD and normal filling pressures with very mild MS, PA 24/15 with PCWP 10, PVR 1.9 W Respiratory viral panel was positive for rhinovirus. PCCM consulted for persistent wheezing and hypoxia.  She was on room air until yesterday and now requires up to 3 L nasal cannula  Pertinent  Medical History  AAA s/p stent graft 2023  Essential hypertension HFpEF , hx of  MVP and severe MR s/p minimally invasive MV repair 2020,  CKD stage III , renal cell carcinoma s/p right nephrectomy 2011   Significant Hospital Events: Including procedures, antibiotic start and stop dates in addition to other pertinent events     Interim History / Subjective:  On my arrival, patient in mild distress Heart rate 160 Hypotensive 90s RNs obtaining IV  Objective    Blood pressure 112/79, pulse (!) 120, temperature 98.7 F (37.1 C), temperature source Oral, resp. rate (!) 22, height 5' 1 (1.549 m), weight 57.3 kg, SpO2 97%.        Intake/Output Summary (Last 24 hours) at 07/21/2024 1042 Last data filed at 07/21/2024 1000 Gross  per 24 hour  Intake 246 ml  Output --  Net 246 ml   Filed Weights   07/19/24 0533 07/20/24 0500 07/21/24 0500  Weight: 59.8 kg 57.7 kg 57.3 kg    Examination: General: Elderly woman sitting in bed, mild distress HENT: No pallor, no JVD, no icterus Lungs: Faint expiratory rhonchi bilateral, no accessory muscle use Cardiovascular: S1-S2 tacky, regular Abdomen: Soft, nontender abdomen Extremities: No edema, no deformity Neuro: Alert, interactive, nonfocal   Labs show normal electrolytes, BUN/creatinine 42/1.5, decreased leukocytosis  Resolved problem list   Assessment and Plan   COPD exacerbation due to rhinovirus, no evidence of pneumonia Not uncommon for bronchospasm to last longer in the setting of rhinovirus -IV Solu-Medrol  40 every 12 - Agree with triple therapy nebs - Avoid excessive albuterol  since this may worsen  tachyarrhythmia   SVT -appears sinus tach on monitor EKG twelve-lead stat Will give fluid bolus 500 cc for mild hypotension Then trial 5 to 10 mg Cardizem, try to avoid beta-blockade Will not tolerate tachyarrhythmias well due to underlying mitral stenosis   Labs   CBC: Recent Labs  Lab 07/16/24 1940 07/17/24 0335 07/18/24 0456 07/19/24 0529 07/20/24 0503  WBC 9.7 4.9 16.5* 17.4* 11.9*  NEUTROABS 7.8*  --   --   --   --   HGB 11.0* 9.3* 10.9* 11.9* 12.4  HCT 34.2* 30.7* 34.7* 37.5 37.3  MCV 92.4 93.9 92.8 93.3 89.9  PLT 205 134* 219 302 274    Basic Metabolic Panel: Recent Labs  Lab 07/16/24 1940 07/17/24  9966 07/17/24 0335 07/18/24 0456 07/19/24 0529 07/20/24 0503  NA 137  --  139 137 140 140  K 3.1*  --  3.0* 4.2 4.7 4.2  CL 100  --  102 103 101 98  CO2 25  --  20* 22 26 27   GLUCOSE 148*  --  166* 140* 105* 112*  BUN 15  --  15 16 30* 42*  CREATININE 1.22*  --  1.24* 1.17* 1.31* 1.47*  CALCIUM  9.3  --  9.6 10.0 10.4* 10.1  MG  --  3.3* 3.0*  --   --   --   PHOS  --  3.0 2.7 3.2 3.5 4.1   GFR: Estimated Creatinine  Clearance: 24.6 mL/min (A) (by C-G formula based on SCr of 1.47 mg/dL (H)). Recent Labs  Lab 07/17/24 0033 07/17/24 0335 07/17/24 1146 07/17/24 1508 07/18/24 0456 07/19/24 0529 07/19/24 1103 07/20/24 0503  PROCALCITON <0.10  --   --   --   --   --   --   --   WBC  --  4.9  --   --  16.5* 17.4*  --  11.9*  LATICACIDVEN 3.8* 5.0*  5.1* 2.8* 2.3*  --   --  1.4  --     Liver Function Tests: Recent Labs  Lab 07/16/24 1940 07/17/24 0335 07/18/24 0456 07/19/24 0529 07/20/24 0503  AST 22 24  --   --   --   ALT 11 11  --   --   --   ALKPHOS 68 71  --   --   --   BILITOT 0.3 0.2  --   --   --   PROT 6.1* 6.3*  --   --   --   ALBUMIN  3.5 3.5 3.5 3.7 3.5   No results for input(s): LIPASE, AMYLASE in the last 168 hours. No results for input(s): AMMONIA in the last 168 hours.  ABG    Component Value Date/Time   PHART 7.49 (H) 07/19/2024 1121   PCO2ART 43 07/19/2024 1121   PO2ART 94 07/19/2024 1121   HCO3 32.8 (H) 07/19/2024 1121   TCO2 22 07/23/2021 0852   ACIDBASEDEF 5.0 (H) 04/21/2019 0529   O2SAT 99 07/19/2024 1121     Coagulation Profile: Recent Labs  Lab 07/16/24 1940  INR 1.0    Cardiac Enzymes: Recent Labs  Lab 07/17/24 0033  CKTOTAL 62    HbA1C: Hgb A1c MFr Bld  Date/Time Value Ref Range Status  06/19/2022 11:21 AM 5.3 4.8 - 5.6 % Final    Comment:    (NOTE) Pre diabetes:          5.7%-6.4%  Diabetes:              >6.4%  Glycemic control for   <7.0% adults with diabetes   10/31/2019 02:32 PM 5.5 4.6 - 6.5 % Final    Comment:    Glycemic Control Guidelines for People with Diabetes:Non Diabetic:  <6%Goal of Therapy: <7%Additional Action Suggested:  >8%     CBG: No results for input(s): GLUCAP in the last 168 hours.  Review of Systems:   Palpitations Shortness of breath, cough, wheezing No nausea vomiting abdominal pain, dizziness headache, chest pain  Past Medical History:  She,  has a past medical history of AAA (abdominal  aortic aneurysm), AKI (acute kidney injury), Arthritis, Chest pain (04/17/2016), Cholelithiasis (2011), Chronic diastolic congestive heart failure (HCC), COPD (chronic obstructive pulmonary disease) (HCC), GERD (gastroesophageal reflux disease), History of renal  cell carcinoma, Hypercholesteremia, Hypertension, Hypertension, Mitral regurgitation, Nausea and vomiting (06/19/2022), Pneumonia (1980's), Renal cell carcinoma (01/2010), S/P minimally invasive mitral valve repair (04/20/2019), Shortness of breath, and Tuberculosis.   Surgical History:   Past Surgical History:  Procedure Laterality Date   ABDOMINAL AORTIC ENDOVASCULAR STENT GRAFT N/A 01/10/2022   Procedure: ABDOMINAL AORTIC ENDOVASCULAR STENT GRAFT;  Surgeon: Gretta Lonni PARAS, MD;  Location: [facility] OR;  Service: Vascular;  Laterality: N/A;   APPENDECTOMY  1970's   BUBBLE STUDY  03/18/2019   Procedure: BUBBLE STUDY;  Surgeon: Okey Vina GAILS, MD;  Location: [facility] ENDOSCOPY;  Service: Cardiovascular;;   CARDIAC CATHETERIZATION  09/2011   CHOLECYSTECTOMY  01/2010   DIAGNOSTIC LAPAROSCOPY     KIDNEY SURGERY     LEFT AND RIGHT HEART CATHETERIZATION WITH CORONARY ANGIOGRAM N/A 09/30/2011   Procedure: LEFT AND RIGHT HEART CATHETERIZATION WITH CORONARY ANGIOGRAM;  Surgeon: Oneil Parchment, MD;  Location: [facility] CATH LAB;  Service: Cardiovascular;  Laterality: N/A;   MITRAL VALVE REPAIR Right 04/20/2019   Procedure: MINIMALLY INVASIVE MITRAL VALVE REPAIR (MVR) USING MEMO 4D SIZE 30;  Surgeon: Dusty Sudie DEL, MD;  Location: [facility] OR;  Service: Open Heart Surgery;  Laterality: Right;   MULTIPLE EXTRACTIONS WITH ALVEOLOPLASTY  10/06/2011   Procedure: MULTIPLE EXTRACION WITH ALVEOLOPLASTY;  Surgeon: Tanda JULIANNA Fanny, DDS;  Location: [facility] OR;  Service: Oral Surgery;  Laterality: N/A;  Multiple extraction of tooth #'s 1, 6, 8, 10, 11, 22, 23, 26, 27, 28, 29 with alveoloplasty and Upper right buccal exostoses reductions.   NEPHRECTOMY RADICAL  01/2010   right    ORIF  WRIST FRACTURE Right 02/17/2022   Procedure: OPEN REDUCTION INTERNAL FIXATION (ORIF) WRIST FRACTURE;  Surgeon: Shari Easter, MD;  Location: [facility] OR;  Service: Orthopedics;  Laterality: Right;  with IV sedation   OVARIAN CYST REMOVAL  1970's   went through belly button   RIGHT HEART CATH N/A 07/20/2024   Procedure: RIGHT HEART CATH;  Surgeon: Cherrie Toribio SAUNDERS, MD;  Location: [facility] INVASIVE CV LAB;  Service: Cardiovascular;  Laterality: N/A;   RIGHT/LEFT HEART CATH AND CORONARY ANGIOGRAPHY N/A 03/22/2019   Procedure: RIGHT/LEFT HEART CATH AND CORONARY ANGIOGRAPHY;  Surgeon: Dann Candyce RAMAN, MD;  Location: [facility] INVASIVE CV LAB;  Service: Cardiovascular;  Laterality: N/A;   TEE WITHOUT CARDIOVERSION  10/01/2011   Procedure: TRANSESOPHAGEAL ECHOCARDIOGRAM (TEE);  Surgeon: Candyce CANDIE Dann;  Location: [facility] ENDOSCOPY;  Service: Cardiovascular;  Laterality: N/A;   TEE WITHOUT CARDIOVERSION N/A 03/18/2019   Procedure: TRANSESOPHAGEAL ECHOCARDIOGRAM (TEE);  Surgeon: Okey Vina GAILS, MD;  Location: [facility] ENDOSCOPY;  Service: Cardiovascular;  Laterality: N/A;   TEE WITHOUT CARDIOVERSION N/A 04/20/2019   Procedure: TRANSESOPHAGEAL ECHOCARDIOGRAM (TEE);  Surgeon: Dusty Sudie DEL, MD;  Location: [facility] OR;  Service: Open Heart Surgery;  Laterality: N/A;   TUBAL LIGATION  1970's   US  ECHOCARDIOGRAPHY  09/2011     Social History:   reports that she has been smoking cigarettes. She started smoking about 58 years ago. She has a 26.5 pack-year smoking history. She has been exposed to tobacco smoke. She has never used smokeless tobacco. She reports that she does not currently use alcohol. She reports current drug use. Drug: Cocaine.   Family History:  Her family history includes COPD in her mother; Diabetes in her brother and brother. There is no history of Colon cancer, Rectal cancer, Esophageal cancer, or Stomach cancer.   Allergies No Known Allergies   Home Medications  Prior to Admission medications   Medication  Sig Start Date End Date Taking? Authorizing Provider  albuterol  (VENTOLIN  HFA) 108 (90 Base) MCG/ACT inhaler Inhale 2 puffs into the lungs every 6 (six) hours as needed for wheezing or shortness of breath. 02/08/20  Yes Gladis Elsie BROCKS, PA-C  Fluticasone -Umeclidin-Vilant (TRELEGY ELLIPTA ) 200-62.5-25 MCG/ACT AEPB Inhale 1 puff into the lungs daily. 03/05/23  Yes Hunsucker, Donnice SAUNDERS, MD  losartan  (COZAAR ) 50 MG tablet Take 50 mg by mouth daily.   Yes [provider]  metoprolol  tartrate (LOPRESSOR ) 50 MG tablet Take 50 mg by mouth 2 (two) times daily. 03/02/23  Yes [provider]  pantoprazole  (PROTONIX ) 40 MG tablet Take 1 tablet (40 mg total) by mouth daily. 08/18/22  Yes Zehr, Jessica D, PA-C  rosuvastatin  (CRESTOR ) 40 MG tablet Take 40 mg by mouth daily. 06/11/22  Yes [provider]  Doxylamine  Succinate, Sleep, (SLEEP-AID PO) Take 1 tablet by mouth daily as needed. Patient not taking: Reported on 07/17/2024    [provider]  famotidine  (PEPCID ) 20 MG tablet Take 1 tablet (20 mg total) by mouth 2 (two) times daily. Patient not taking: Reported on 07/17/2024 03/13/24   Vicky Charleston, PA-C  sucralfate  (CARAFATE ) 1 g tablet Take 1 tablet (1 g total) by mouth 4 (four) times daily -  with meals and at bedtime. Patient not taking: Reported on 07/17/2024 03/13/24   Vicky Charleston, PA-C      Harden Staff MD. [facility]. [facility] Pager : [phone]  If no response to pager , please call [phone] until 7 pm After 7:00 pm call Elink  [phone]   07/21/2024

## 2024-07-21 NOTE — Significant Event (Signed)
 Rapid Response Event Note   Reason for Call :  Symptomatic tachycardia  Initial Focused Assessment:  Patient sitting upright in bed, AO x4, diaphoretic. Multiple PIVs lost over the past several days, so no IV access at time of rapid response arrival. 2 22g PIVs placed bilateral hands, patient awaiting placement of PICC line.   BP trending down w/ systolic in 90s; see flowsheet for VS. bolus initiated HR 160, EKG obtained, a-flutter vs ST. Cardizem pushed x1 with brief resolution of tachycardia, however, rate ultimately increased back to 160 within 5 minutes of pushing cardizem. A second 5mg  dose was administered per Dr. Alva (at bedside). BP improved, HR remained in the 90s. EKG obtained, NSR. Amio ordered by Dr. Jude with verbal order to bedside RN to initiate amio infusion if patient's HR sustains greater than 140.      Interventions:  5mg  cardizem given x2 per Dr. Jude  Plan of Care:  Amio infusion to be initiated if HR increases    Event Summary:   MD Notified: Dr. Vann, Dr. Alva at bedside Call Time: 1051 Arrival Time: 1054 End Time: 1145  Micheale Schlack, RN

## 2024-07-21 NOTE — Progress Notes (Signed)
  Progress Note  Patient Name: Katie [name] Date of Encounter: 07/21/2024 [facility] Cardiologist: Katie Scarce, MD   Interval Summary   No events overnight. Patient did not sleep well because she got back late from the procedure. She denies any CP but reports persistent dyspnea and wheezing.   Vital Signs Vitals:   07/21/24 0500 07/21/24 0545 07/21/24 0738 07/21/24 0900  BP:  134/78  112/79  Pulse:  (!) 113  (!) 120  Resp:  18  (!) 22  Temp:  98.7 F (37.1 C)    TempSrc:  Oral    SpO2:  97% 97% 97%  Weight: 57.3 kg     Height:        Intake/Output Summary (Last 24 hours) at 07/21/2024 0950 Last data filed at 07/20/2024 2250 Gross per 24 hour  Intake 6 ml  Output --  Net 6 ml      07/21/2024    5:00 AM 07/20/2024    5:00 AM 07/19/2024    5:33 AM  Last 3 Weights  Weight (lbs) 126 lb 6.4 oz 127 lb 1.6 oz 131 lb 13.4 oz  Weight (kg) 57.335 kg 57.652 kg 59.8 kg      Telemetry/ECG  Sinus tachycardia - Personally Reviewed  Physical Exam  GEN: No acute distress.   Neck: No JVD Cardiac: Tachycardic but regular rhythm, no murmurs, rubs, or gallops.  Respiratory: Clear to auscultation bilaterally w/ significant both inspiratory and expiratory wheezing  GI: Soft, nontender, non-distended  Katie: No edema  Assessment & Plan  Katie [name] is a 34 yoF with Hx of MVP c/b severe MR s/p MV repair (2020), non-obstructive CAD, cocaine use, carotid disease, HFpEF, AAA s/p stent 2023, and CKDIII presenting with COPD exacerbation and HFpEF found to have MG 13 mmHg on TTE and significant LVOT gradient.   #COPD exacerbation #HFpEF exacerbation #Significant LVOT gradient w/ valsalva #Non-obstructive CAD #Post op Afib not AC #Ongoing cocaine use #HTN - Presented with COPD and HFpEF exacerbation likely in setting of cocaine use. TTE with severe Katie and significant LVOT gradient. Trop/ECG unremarkable. CTPE with no PE. ABG and lactate on 9/30 were unremarkable and no edema  on chest x-ray. Diuresis discontinued on 10/1 due to worsening AKI suggesting dehydration.  - She had LHC/RHC and valve study on 10/1 and was found to have non-obstructive CAD, normal filling pressures, no PH, and mild Katie (MG 5.7 mmHg, MVA 3.2 cm^2).  - Based on our studies, her symptoms are driven by COPD and not CHF. Would PRN lasix  40 mg daily for increase in LE edema and no TEE needed given mild Katie.  - We will sign off; feel free to call us  back if you have any questions.   For questions or updates, please contact  [facility] Please consult [email or web address] for contact info under   Katie [name] Katie Donley, MD

## 2024-07-21 NOTE — Progress Notes (Signed)
 Peripherally Inserted Central Catheter Placement  The IV Nurse has discussed with the patient and/or persons authorized to consent for the patient, the purpose of this procedure and the potential benefits and risks involved with this procedure.  The benefits include less needle sticks, lab draws from the catheter, and the patient may be discharged home with the catheter. Risks include, but not limited to, infection, bleeding, blood clot (thrombus formation), and puncture of an artery; nerve damage and irregular heartbeat and possibility to perform a PICC exchange if needed/ordered by physician.  Alternatives to this procedure were also discussed.  Bard Power PICC patient education guide, fact sheet on infection prevention and patient information card has been provided to patient /or left at bedside.    PICC Placement Documentation  PICC Single Lumen 07/21/24 Right Basilic 36 cm 0 cm (Active)  Indication for Insertion or Continuance of Line Poor Vasculature-patient has had multiple peripheral attempts or PIVs lasting less than 24 hours;Limited venous access - need for IV therapy >5 days (PICC only) 07/21/24 1302  Exposed Catheter (cm) 0 cm 07/21/24 1302  Site Assessment Clean, Dry, Intact 07/21/24 1302  Line Status Flushed;Saline locked;Blood return noted 07/21/24 1302  Dressing Type Transparent;Securing device 07/21/24 1302  Dressing Status Antimicrobial disc/dressing in place;Clean, Dry, Intact 07/21/24 1302  Line Care Connections checked and tightened 07/21/24 1302  Line Adjustment (NICU/IV Team Only) No 07/21/24 1302  Dressing Intervention New dressing;Adhesive placed at insertion site (IV team only) 07/21/24 1302  Dressing Change Due 07/28/24 07/21/24 1302       Katie  [name] 07/21/2024, 1:04 PM

## 2024-07-21 NOTE — Progress Notes (Signed)
 [facility]                                                                              Katie [name], is a 78 y.o. female, DOB - 1946-05-05, FMW:[id number] Admit date - 07/16/2024    Outpatient Primary MD for the patient is [name], MD  LOS - 5  days  Chief Complaint  Patient presents with   Shortness of Breath       Brief summary   Patient is a 78 year old female with history of COPD, tobacco abuse, cocaine use, diastolic CHF, hypertension, hyperlipidemia, CKD stage IIIa, CAD, history of severe MR status post mitral valve repair in 04/2019, AAA status post EVAR 01/2022 presented with shortness of breath, respiratory dress, cough, increased wheezing for last 3 days.  Patient reported acute worsening at the time of admission with a chest tightness.  Patient was found to be tachypneic, tachycardiac, respiratory distress, lactic acidosis, placed on BiPAP in ED Chest x-ray showed hazy opacities in the right middle and lower lung suspicious for pneumonia.  Patient has been very slow to recover  Assessment & Plan      Acute respiratory failure with hypoxia (HCC) -Multifactorial likely due to viral pneumonia, COPD exacerbation, acute on chronic diastolic CHF - CTA chest negative for PE, showed small bilateral pleural effusions, emphysema, Aortic atherosclerotic classification.  - Wean off O2 as able -RVP- rhinovirus    COPD exacerbation (HCC),    CAP (community acquired pneumonia) - Wean O2 as tolerated - Continue Xopenex , Atrovent  nebs, Pulmicort , Brovana, Yupelri, IV Solu-Medrol . - Finish antibiotic course - Nebs changed to Xopenex  due to tachycardia -Pulmonary consultation appreciated  ? Atrial flutter  vs SVT - Cardizem started with good results - [place] vas 2 at least 4 - Hold on Eliquis as I am not convinced this was a flutter and most likely appears to be SVT  Acute on chronic diastolic CHF, history of CAD, severe MR s/p mitral valve repair in  04/2019 - BNP 7015, received IV Lasix  in ED.  - 2D echo EF 75%, no regional WMA, indeterminate diastolic Parameters. -  UDS positive for cocaine, counseled patient on cocaine cessation, holding off on beta-blocker - Strict I's and O's and daily weights -Lasix  as needed for now - Per cardiology:She had LHC/RHC and valve study on 10/1 and was found to have non-obstructive CAD, normal filling pressures, no PH, and mild MS (MG 5.7 mmHg, MVA 3.2 cm^2).  - Based on our studies, her symptoms are driven by COPD and not CHF. Would PRN lasix  40 mg daily for increase in LE edema and no TEE needed given mild MS.   Lactic acidosis, anion gap metabolic acidosis -Unclear etiology, ABG did not show any respiratory acidosis, no GI losses, not on any medications causing acidosis.  Does not appear to be from sepsis.  -Likely from cocaine.  -Resolved  Hyperthyroidism - New diagnosis, TSH 0.28, free T4 1.34, T3 80 - methimazole 5 mg twice daily - TPO Ab, TSH receptor antibody, ESR 60.  - Thyroid  US  showed moderately heterogeneous thyroid  parenchyma, multiple nodules, none meet criteria for biopsy, may need  RAI uptake scan for further workup outpatient.   - Outpatient referral to endocrinology at discharge for further workup per Dr. Davia  Normocytic anemia/iron deficiency - Hemoglobin 11.0 on admission, trended down to 9.3 on 9/28 -Anemia panel showed iron 17, percent saturation ratio 5, ferritin 97, FOBT pending - H&H is stable/improving-labs pending from today - Recommend outpatient GI workup for iron deficiency anemia  Essential hypertension - Beta-blocker discontinued due to prior history of cocaine use -Blood pressure currently low, so will need close monitoring on Cardizem    AAA (abdominal aortic aneurysm) without rupture - Continue BP control    HLD (hyperlipidemia) -Continue Crestor     Tobacco abuse disorder -Continue nicotine  patch    CKD stage G3a/A1, GFR 45-59 and albumin  creatinine ratio  <30 mg/g (HCC) - Cr at baseline -Lasix  DC'd     QT prolongation -Replace K magnesium  3.0, avoid QT prolonging meds   Estimated body mass index is 23.88 kg/m as calculated from the following:   Height as of this encounter: 5' 1 (1.549 m).   Weight as of this encounter: 57.3 kg.  Code Status: DNR DVT Prophylaxis:  SCDs Start: 07/16/24 2149   Level of Care: Level of care: Progressive Family Communication: No family at bedside Disposition Plan:      Remains inpatient appropriate:        Consultants:   Cardiology Pulmonary     Medications  amiodarone   150 mg Intravenous Once   arformoterol  15 mcg Nebulization BID   budesonide  (PULMICORT ) nebulizer solution  0.25 mg Nebulization BID   Chlorhexidine  Gluconate Cloth  6 each Topical Daily   diltiazem  30 mg Oral Q6H   guaiFENesin   600 mg Oral BID   hydrALAZINE   10 mg Oral Q8H   levalbuterol   0.63 mg Nebulization TID   And   ipratropium  0.5 mg Nebulization Q6H   loratadine  10 mg Oral Daily   losartan   50 mg Oral Daily   methIMAzole  5 mg Oral BID   methylPREDNISolone  (SOLU-MEDROL ) injection  40 mg Intravenous Q12H   nicotine   14 mg Transdermal Daily   pantoprazole   40 mg Oral Daily   polyethylene glycol  17 g Oral Daily   revefenacin  175 mcg Nebulization Daily   rosuvastatin   40 mg Oral Daily   senna-docusate  1 tablet Oral BID   sodium chloride  flush  10-40 mL Intracatheter Q12H   sodium chloride  flush  3 mL Intravenous Q12H   sodium chloride  flush  3 mL Intravenous Q12H      Subjective:   Patient still wheezing and feeling some palpitations and her heart  Objective:   Vitals:   07/21/24 1100 07/21/24 1143 07/21/24 1145 07/21/24 1348  BP: 97/81 106/79 111/73 (!) 128/99  Pulse:    94  Resp:  20 20 16   Temp:    98.4 F (36.9 C)  TempSrc:    Oral  SpO2: 98%  100% 100%  Weight:      Height:        Intake/Output Summary (Last 24 hours) at 07/21/2024 1438 Last data filed at 07/21/2024 1000 Gross  per 24 hour  Intake 246 ml  Output --  Net 246 ml     Wt Readings from Last 3 Encounters:  07/21/24 57.3 kg  06/01/24 63.5 kg  05/04/24 63.5 kg   Physical Exam  General: Appearance:    Well developed, well nourished female in no acute distress     Lungs:  Clear to auscultation bilaterally, respirations unlabored  Heart:    Normal heart rate.     MS:   All extremities are intact.   Neurologic:   Awake, alert    Data Reviewed:  I have personally reviewed following labs    CBC Lab Results  Component Value Date   WBC 11.9 (H) 07/20/2024   RBC 4.15 07/20/2024   HGB 12.2 07/20/2024   HCT 36.0 07/20/2024   MCV 89.9 07/20/2024   MCH 29.9 07/20/2024   PLT 274 07/20/2024   MCHC 33.2 07/20/2024   RDW 12.8 07/20/2024   LYMPHSABS 1.2 07/16/2024   MONOABS 0.7 07/16/2024   EOSABS 0.1 07/16/2024   BASOSABS 0.0 07/16/2024     Last metabolic panel Lab Results  Component Value Date   NA 136 07/20/2024   K 4.0 07/20/2024   CL 98 07/20/2024   CO2 27 07/20/2024   BUN 42 (H) 07/20/2024   CREATININE 1.47 (H) 07/20/2024   GLUCOSE 112 (H) 07/20/2024   GFRNONAA 37 (L) 07/20/2024   GFRAA 37 (L) 04/26/2020   CALCIUM  10.1 07/20/2024   PHOS 4.1 07/20/2024   PROT 6.3 (L) 07/17/2024   ALBUMIN  3.5 07/20/2024   BILITOT 0.2 07/17/2024   ALKPHOS 71 07/17/2024   AST 24 07/17/2024   ALT 11 07/17/2024   ANIONGAP 16 (H) 07/20/2024    CBG (last 3)  No results for input(s): GLUCAP in the last 72 hours.    Coagulation Profile: Recent Labs  Lab 07/16/24 1940  INR 1.0     Radiology Studies: I have personally reviewed the imaging studies  CARDIAC CATHETERIZATION Result Date: 07/20/2024   Ost RCA to Prox RCA lesion is 30% stenosed.   Mid RCA lesion is 50% stenosed.   Mid Cx lesion is 20% stenosed.   Prox LAD lesion is 40% stenosed.   Mid LAD-1 lesion is 40% stenosed.   Mid LAD-2 lesion is 50% stenosed. Findings: Ao =125/104 (115) LV = 137/10 RA = 7 RV = 30/7 PA = 24/15 (17) PCW  = 10 Fick cardiac output/index = 4.7/3.0 PVR = 1.9 WU Ao sat = 99% PA sat = 72%, 74% PAPi = 1.3 MV data = mean gradient 5.7 mmHG MVA 3.2 cm2 (very mild MS) Assessment: 1. Mild CAD with co-dominant system and separate ostia for LAD and LCX 2. Normal filling pressures with very mild MS Plan/Discussion: Suspect main symptoms due to COPD. Continue medical therapy. Toribio Fuel, MD 6:34 PM  US  EKG SITE RITE Result Date: 07/20/2024 If Site Rite image not attached, placement could not be confirmed due to current cardiac rhythm.      Katie RAYMOND Bowl DO [facility] 07/21/2024, 2:38 PM  Available via Epic secure chat 7am-7pm After 7 pm, please refer to night coverage provider listed on amion.

## 2024-07-21 NOTE — Progress Notes (Signed)
 OT Cancellation Note  Patient Details Name: Katie [name] MRN: [id number] DOB: February 13, 1946   Cancelled Treatment:    Reason Eval/Treat Not Completed: Medical issues which prohibited therapy  Patient with elevated HR and medical staff in room assisting with stabilization upon OT attempted visit. Will continue to follow acutely when medically able and schedule allows.   Kami Kube OT/L Acute Rehabilitation Department  [phone]   07/21/2024, 11:27 AM

## 2024-07-22 DIAGNOSIS — J9601 Acute respiratory failure with hypoxia: Secondary | ICD-10-CM | POA: Diagnosis not present

## 2024-07-22 DIAGNOSIS — I471 Supraventricular tachycardia, unspecified: Secondary | ICD-10-CM

## 2024-07-22 LAB — CBC
HCT: 33.9 % — ABNORMAL LOW (ref 36.0–46.0)
Hemoglobin: 10.7 g/dL — ABNORMAL LOW (ref 12.0–15.0)
MCH: 29.5 pg (ref 26.0–34.0)
MCHC: 31.6 g/dL (ref 30.0–36.0)
MCV: 93.4 fL (ref 80.0–100.0)
Platelets: 251 K/uL (ref 150–400)
RBC: 3.63 MIL/uL — ABNORMAL LOW (ref 3.87–5.11)
RDW: 12.5 % (ref 11.5–15.5)
WBC: 6.7 K/uL (ref 4.0–10.5)
nRBC: 0 % (ref 0.0–0.2)

## 2024-07-22 LAB — CULTURE, BLOOD (ROUTINE X 2)
Culture: NO GROWTH
Culture: NO GROWTH
Special Requests: ADEQUATE
Special Requests: ADEQUATE

## 2024-07-22 LAB — BASIC METABOLIC PANEL WITH GFR
Anion gap: 11 (ref 5–15)
BUN: 43 mg/dL — ABNORMAL HIGH (ref 8–23)
CO2: 27 mmol/L (ref 22–32)
Calcium: 9.3 mg/dL (ref 8.9–10.3)
Chloride: 101 mmol/L (ref 98–111)
Creatinine, Ser: 1.32 mg/dL — ABNORMAL HIGH (ref 0.44–1.00)
GFR, Estimated: 42 mL/min — ABNORMAL LOW (ref >60)
Glucose, Bld: 131 mg/dL — ABNORMAL HIGH (ref 70–99)
Potassium: 4.3 mmol/L (ref 3.5–5.1)
Sodium: 139 mmol/L (ref 135–145)

## 2024-07-22 MED ORDER — APIXABAN 5 MG PO TABS
5.0000 mg | ORAL_TABLET | Freq: Two times a day (BID) | ORAL | Status: DC
  Administered 2024-07-22 – 2024-07-25 (×7): 5 mg via ORAL
  Filled 2024-07-22 (×7): qty 1

## 2024-07-22 MED ORDER — IPRATROPIUM BROMIDE 0.02 % IN SOLN
0.5000 mg | Freq: Four times a day (QID) | RESPIRATORY_TRACT | Status: DC
Start: 2024-07-22 — End: 2024-07-24
  Administered 2024-07-22 – 2024-07-23 (×5): 0.5 mg via RESPIRATORY_TRACT
  Filled 2024-07-22 (×6): qty 2.5

## 2024-07-22 MED ORDER — DILTIAZEM HCL ER COATED BEADS 120 MG PO CP24
120.0000 mg | ORAL_CAPSULE | Freq: Every day | ORAL | Status: DC
  Administered 2024-07-22 – 2024-07-25 (×4): 120 mg via ORAL
  Filled 2024-07-22 (×4): qty 1

## 2024-07-22 MED ORDER — LEVALBUTEROL HCL 0.63 MG/3ML IN NEBU
0.6300 mg | INHALATION_SOLUTION | Freq: Four times a day (QID) | RESPIRATORY_TRACT | Status: DC
  Administered 2024-07-22 – 2024-07-23 (×5): 0.63 mg via RESPIRATORY_TRACT
  Filled 2024-07-22 (×6): qty 3

## 2024-07-22 NOTE — Progress Notes (Signed)
  Progress Note  Patient Name: Katie [name] Date of Encounter: 07/22/2024 [facility] Cardiologist: Annabella Scarce, MD   Interval Summary   Yesterday, she went into Aflutter w/ RVR up to 160s. She denies any palpitations during that time. She received IV dilt 10 mg and was started on PO Dilt 30 Q6H with conversion to NSR. She continues to report some dyspnea but stable from prior.  Vital Signs Vitals:   07/22/24 0000 07/22/24 0619 07/22/24 0632 07/22/24 0830  BP: 113/69 134/86    Pulse: 84 90    Resp:  18    Temp:  98.2 F (36.8 C)    TempSrc:  Oral    SpO2:  97%  97%  Weight:   51.1 kg   Height:        Intake/Output Summary (Last 24 hours) at 07/22/2024 0904 Last data filed at 07/22/2024 9167 Gross per 24 hour  Intake 480 ml  Output 900 ml  Net -420 ml      07/22/2024    6:32 AM 07/21/2024    5:00 AM 07/20/2024    5:00 AM  Last 3 Weights  Weight (lbs) 112 lb 10.5 oz 126 lb 6.4 oz 127 lb 1.6 oz  Weight (kg) 51.1 kg 57.335 kg 57.652 kg      Telemetry/ECG  NSR - Personally Reviewed  Physical Exam  GEN: No acute distress.   Neck: No JVD Cardiac: RRR, no murmurs, rubs, or gallops.  Respiratory: Clear to auscultation bilaterally. GI: Soft, nontender, non-distended  MS: No edema  Assessment & Plan  Ms Ponciano is a 54 yoF with Hx of MVP c/b severe MR s/p MV repair (2020), non-obstructive CAD, cocaine use, carotid disease, HFpEF, AAA s/p stent 2023, and CKDIII presenting with COPD exacerbation and HFpEF found to have MG 13 mmHg on TTE and significant LVOT gradient.   #COPD exacerbation 2/2 rhinovirus #New onset Atrial Flutter with RVR #HFpEF exacerbation w/ significant LVOT gradient w/ valsalva #Non-obstructive CAD #Post op Afib not AC #Ongoing cocaine use #HTN - Presented with COPD and HFpEF exacerbation likely in setting of cocaine use. TTE with severe MS and significant LVOT gradient. Trop/ECG unremarkable. CTPE with no PE. ABG and lactate on 9/30  were unremarkable and no edema on chest x-ray. Diuresis discontinued on 10/1 due to worsening AKI suggesting dehydration. She had LHC/RHC and valve study on 10/1 and was found to have non-obstructive CAD, normal filling pressures, no PH, and mild MS (MG 5.7 mmHg, MVA 3.2 cm^2). Went into Aflutter with RVR on 10/2 that converted back to NSR with IV dilt 10 mg x 1 followed by Dilt 30 Q6H.  - Transition PO Dilt to 120 mg extended release daily - Start Eliquis 5 mg BID for AC given new Aflutter diagnosis - PRN lasix  40 mg daily for increase in LE edema and no TEE needed given mild MS.  - We will sign off; feel free to call us  back if you have any questions.   For questions or updates, please contact [facility] Please consult [email or web address] for contact info under   Signed, [name] Ren Donley, MD

## 2024-07-22 NOTE — Plan of Care (Incomplete)
  Problem: Health Behavior/Discharge Planning: Goal: Ability to manage health-related needs will improve Outcome: Progressing   Problem: Clinical Measurements: Goal: Ability to maintain clinical measurements within normal limits will improve Outcome: Progressing Goal: Diagnostic test results will improve Outcome: Progressing Goal: Respiratory complications will improve Outcome: Progressing Goal: Cardiovascular complication will be avoided Outcome: Progressing   Problem: Activity: Goal: Risk for activity intolerance will decrease Outcome: Progressing   Problem: Activity: Goal: Ability to tolerate increased activity will improve Outcome: Progressing   Problem: Respiratory: Goal: Ability to maintain a clear airway will improve Outcome: Progressing   Problem: Education: Goal: Knowledge of General Education information will improve Description: Including pain rating scale, medication(s)/side effects and non-pharmacologic comfort measures Outcome: Adequate for Discharge   Problem: Clinical Measurements: Goal: Will remain free from infection Outcome: Adequate for Discharge   Problem: Nutrition: Goal: Adequate nutrition will be maintained Outcome: Adequate for Discharge   Problem: Coping: Goal: Level of anxiety will decrease Outcome: Adequate for Discharge   Problem: Elimination: Goal: Will not experience complications related to bowel motility Outcome: Adequate for Discharge Goal: Will not experience complications related to urinary retention Outcome: Adequate for Discharge   Problem: Pain Managment: Goal: General experience of comfort will improve and/or be controlled Outcome: Adequate for Discharge   Problem: Safety: Goal: Ability to remain free from injury will improve Outcome: Adequate for Discharge   Problem: Skin Integrity: Goal: Risk for impaired skin integrity will decrease Outcome: Adequate for Discharge

## 2024-07-22 NOTE — Progress Notes (Signed)
 Occupational Therapy Treatment Patient Details Name: Katie [name] MRN: [id number] DOB: 12-Dec-1945 Today's Date: 07/22/2024   History of present illness 78 yr old female admitted with acute respiratory failure, COPD exacerbation, acute on chronic heart failure, bilateral pleural effuisons. PMH: CKD, severe MVRs/p repair 2020, AAA s/p stent graft 2023, cocaine use, HF, COPD, renal cell CA, CAD   OT comments  The pt was seen for progression of functional activity and education on implementing energy conservation strategies during activity. She required SBA for most tasks, including lower body dressing seated EOB, sit to stand, and functional ambulation with & without a rolling walker. Her O2 saturation was noted to be 98% on 2L at rest, 94% on 2L with activity, and 92% on room air with activity. Her heart rate increased up to 122 bpm with activity. She reported feelings of increased shortness of breath with progressive activity. She was instructed on implementing pursed lip breathing exercises, monitoring her O2 saturation and heart rate using a pulse oximeter, and appropriate pacing during activities as she admitted she tends to attempt tasks in a hurried manner. An educational handout was reviewed and provided on energy conservation strategies to implement as needed during ADLs and IADLs. Continue OT plan of care. Home health OT is recommended.       If plan is discharge home, recommend the following:  A little help with walking and/or transfers;A little help with bathing/dressing/bathroom;Assistance with cooking/housework;Assist for transportation;Help with stairs or ramp for entrance   Equipment Recommendations  BSC/3in1;Tub/shower bench    Recommendations for Other Services      Precautions / Restrictions Precautions Precaution/Restrictions Comments: monitor O2, HR Restrictions Weight Bearing Restrictions Per Provider Order: No       Mobility Bed Mobility Overal bed mobility:  Modified Independent Bed Mobility: Supine to Sit, Sit to Supine     Supine to sit: Modified independent (Device/Increase time) Sit to supine: Modified independent (Device/Increase time)        Transfers Overall transfer level: Needs assistance Equipment used: None Transfers: Sit to/from Stand Sit to Stand: Supervision                 Balance Overall balance assessment: Mild deficits observed, not formally tested                    ADL either performed or assessed with clinical judgement   ADL Overall ADL's : Needs assistance/impaired          Lower Body Dressing: Set up;Supervision/safety;Sitting/lateral leans Lower Body Dressing Details (indicate cue type and reason): For implementing the figure technique to doff then donn socks seated EOB.            Vision Baseline Vision/History: 1 Wears glasses           Communication Communication Communication: No apparent difficulties   Cognition Arousal: Alert Behavior During Therapy: WFL for tasks assessed/performed Cognition: No apparent impairments          Following commands: Intact        Cueing   Cueing Techniques: Verbal cues             Pertinent Vitals/ Pain       Pain Assessment Pain Assessment: No/denies pain   Frequency  Min 2X/week        Progress Toward Goals  OT Goals(current goals can now be found in the care plan section)  Progress towards OT goals: Progressing toward goals  Acute Rehab OT Goals OT Goal Formulation: With patient  Time For Goal Achievement: 08/01/24 Potential to Achieve Goals: Good  Plan         AM-PAC OT 6 Clicks Daily Activity     Outcome Measure   Help from another person eating meals?: None Help from another person taking care of personal grooming?: A Little Help from another person toileting, which includes using toliet, bedpan, or urinal?: A Little Help from another person bathing (including washing, rinsing, drying)?: A Little Help  from another person to put on and taking off regular upper body clothing?: None Help from another person to put on and taking off regular lower body clothing?: A Little 6 Click Score: 20    End of Session Equipment Utilized During Treatment: Oxygen ;Rolling walker (2 wheels)  OT Visit Diagnosis: Unsteadiness on feet (R26.81)   Activity Tolerance Patient tolerated treatment well   Patient Left in bed;with call bell/phone within reach;with nursing/sitter in room   Nurse Communication Other (comment) (O2 saturation and heart rate with activity)        Time: 8460-8391 OT Time Calculation (min): 29 min  Charges: OT General Charges $OT Visit: 1 Visit OT Treatments $Self Care/Home Management : 8-22 mins $Therapeutic Activity: 8-22 mins    Delanna JINNY Lesches, OTR/L 07/22/2024, 5:14 PM

## 2024-07-22 NOTE — Progress Notes (Signed)
 NAME:  Katie [name], MRN:  [id number], DOB:  September 30, 1946, LOS: 6 ADMISSION DATE:  07/16/2024, CONSULTATION DATE:  07/22/2024  REFERRING MD:  Vann, TRH, CHIEF COMPLAINT: Wheezing, hypoxia  History of Present Illness:  78 year old smoker with severe COPD who follows with my partner Dr. Annella, last seen 02/2023. PFT 03/2019 F showed severe COPD with FEV1 of 33% and hyperinflation with TLC 135% She was admitted with 3 days of coughing and wheezing with shortness of breath Initial labs showed no leukocytosis, lactate of 3.8, proBNP 7015.  Chest x-ray showed hazy opacities right middle and lower lung.  Treated with ceftriaxone  and doxycycline . CT angiogram chest surprisingly showed severe emphysema but no evidence of pneumonia, stable calcified pleural plaque at the right lung base UDS was positive for cocaine.  Seen by cardiology and underwent LHC/right heart cath which showed only mild CAD and normal filling pressures with very mild MS, PA 24/15 with PCWP 10, PVR 1.9 W Respiratory viral panel was positive for rhinovirus. PCCM consulted for persistent wheezing and hypoxia.  She was on room air until yesterday and now requires up to 3 L nasal cannula  Pertinent  Medical History  AAA s/p stent graft 2023  Essential hypertension HFpEF , hx of  MVP and severe MR s/p minimally invasive MV repair 2020,  CKD stage III , renal cell carcinoma s/p right nephrectomy 2011   Significant Hospital Events: Including procedures, antibiotic start and stop dates in addition to other pertinent events   10/2 SVT treated with Cardizem?  Atrial flutter 2-1  Interim History / Subjective:   Breathing much improved On room air No further tachyarrhythmias  Objective    Blood pressure 134/86, pulse 90, temperature 98.2 F (36.8 C), temperature source Oral, resp. rate 18, height 5' 1 (1.549 m), weight 51.1 kg, SpO2 97%.        Intake/Output Summary (Last 24 hours) at 07/22/2024 1122 Last data filed at  07/22/2024 9167 Gross per 24 hour  Intake 240 ml  Output 900 ml  Net -660 ml   Filed Weights   07/20/24 0500 07/21/24 0500 07/22/24 9367  Weight: 57.7 kg 57.3 kg 51.1 kg    Examination: General: Elderly woman sitting in bed, no distress HENT: No pallor, no JVD, no icterus Lungs: No accessory muscle use, faint bilateral expiratory rhonchi Cardiovascular: S1-S2 regular, no murmur Abdomen: Soft, nontender abdomen Extremities: No edema, no deformity Neuro: Alert, interactive, nonfocal   Labs show normal electrolytes, BUN/creatinine 43/1.3, no leukocytosis  Resolved problem list   Assessment and Plan   COPD exacerbation due to rhinovirus, no evidence of pneumonia Not uncommon for bronchospasm to last longer in the setting of rhinovirus -IV Solu-Medrol  40 every 12, can transition to oral prednisone  and taper over 2 weeks if bronchospasms improved - Agree with triple therapy nebs - Avoid excessive albuterol  since this may worsen  tachyarrhythmia   SVT -improved with Cardizem  ?  Atrial flutter 2-1, cardiology following Try to avoid beta-blockade Will not tolerate tachyarrhythmias well due to underlying mitral stenosis   PCCM will be available as needed Office follow-up with Dr. Annella after discharge Emphasized smoking cessation and substance abuse counseling regarding cocaine   Labs   CBC: Recent Labs  Lab 07/16/24 1940 07/17/24 0335 07/18/24 0456 07/19/24 0529 07/20/24 0503 07/20/24 1746 07/20/24 1750 07/20/24 1751 07/22/24 0257  WBC 9.7 4.9 16.5* 17.4* 11.9*  --   --   --  6.7  NEUTROABS 7.8*  --   --   --   --   --   --   --   --  HGB 11.0* 9.3* 10.9* 11.9* 12.4 11.9* 12.2 12.2 10.7*  HCT 34.2* 30.7* 34.7* 37.5 37.3 35.0* 36.0 36.0 33.9*  MCV 92.4 93.9 92.8 93.3 89.9  --   --   --  93.4  PLT 205 134* 219 302 274  --   --   --  251    Basic Metabolic Panel: Recent Labs  Lab 07/17/24 0033 07/17/24 0335 07/18/24 0456 07/19/24 0529 07/20/24 0503  07/20/24 1746 07/20/24 1750 07/20/24 1751 07/22/24 0257  NA  --  139 137 140 140 139 137 136 139  K  --  3.0* 4.2 4.7 4.2 3.7 3.9 4.0 4.3  CL  --  102 103 101 98  --   --   --  101  CO2  --  20* 22 26 27   --   --   --  27  GLUCOSE  --  166* 140* 105* 112*  --   --   --  131*  BUN  --  15 16 30* 42*  --   --   --  43*  CREATININE  --  1.24* 1.17* 1.31* 1.47*  --   --   --  1.32*  CALCIUM   --  9.6 10.0 10.4* 10.1  --   --   --  9.3  MG 3.3* 3.0*  --   --   --   --   --   --   --   PHOS 3.0 2.7 3.2 3.5 4.1  --   --   --   --    GFR: Estimated Creatinine Clearance: 27.4 mL/min (A) (by C-G formula based on SCr of 1.32 mg/dL (H)). Recent Labs  Lab 07/17/24 0033 07/17/24 0335 07/17/24 1146 07/17/24 1508 07/18/24 0456 07/19/24 0529 07/19/24 1103 07/20/24 0503 07/22/24 0257  PROCALCITON <0.10  --   --   --   --   --   --   --   --   WBC  --  4.9  --   --  16.5* 17.4*  --  11.9* 6.7  LATICACIDVEN 3.8* 5.0*  5.1* 2.8* 2.3*  --   --  1.4  --   --     Liver Function Tests: Recent Labs  Lab 07/16/24 1940 07/17/24 0335 07/18/24 0456 07/19/24 0529 07/20/24 0503  AST 22 24  --   --   --   ALT 11 11  --   --   --   ALKPHOS 68 71  --   --   --   BILITOT 0.3 0.2  --   --   --   PROT 6.1* 6.3*  --   --   --   ALBUMIN  3.5 3.5 3.5 3.7 3.5   No results for input(s): LIPASE, AMYLASE in the last 168 hours. No results for input(s): AMMONIA in the last 168 hours.  ABG    Component Value Date/Time   PHART 7.475 (H) 07/20/2024 1746   PCO2ART 42.1 07/20/2024 1746   PO2ART 205 (H) 07/20/2024 1746   HCO3 34.5 (H) 07/20/2024 1751   TCO2 36 (H) 07/20/2024 1751   ACIDBASEDEF 5.0 (H) 04/21/2019 0529   O2SAT 74 07/20/2024 1751     Coagulation Profile: Recent Labs  Lab 07/16/24 1940  INR 1.0    Cardiac Enzymes: Recent Labs  Lab 07/17/24 0033  CKTOTAL 62    HbA1C: Hgb A1c MFr Bld  Date/Time Value Ref Range Status  06/19/2022 11:21 AM 5.3 4.8 - 5.6 % Final  Comment:     (NOTE) Pre diabetes:          5.7%-6.4%  Diabetes:              >6.4%  Glycemic control for   <7.0% adults with diabetes   10/31/2019 02:32 PM 5.5 4.6 - 6.5 % Final    Comment:    Glycemic Control Guidelines for People with Diabetes:Non Diabetic:  <6%Goal of Therapy: <7%Additional Action Suggested:  >8%     CBG: No results for input(s): GLUCAP in the last 168 hours.      Harden Staff MD. DEBE. [facility] Pager : [phone]  If no response to pager , please call [phone] until 7 pm After 7:00 pm call Elink  [phone]   07/22/2024

## 2024-07-22 NOTE — Progress Notes (Signed)
F/u arranged and placed on AVS.

## 2024-07-22 NOTE — Progress Notes (Signed)
   07/22/24 2046  BiPAP/CPAP/SIPAP  $ Non-Invasive Ventilator  Non-Invasive Vent Subsequent  BiPAP/CPAP/SIPAP Pt Type Adult  BiPAP/CPAP/SIPAP V60  Reason BIPAP/CPAP not in use Other(comment) (PRN - not indicated at this time. Pt using 2 lpm )  FiO2 (%) 28 %  Flow Rate 2 lpm  Patient Home Machine No  Patient Home Mask No  Patient Home Tubing No  Device Plugged into RED Power Outlet Yes

## 2024-07-22 NOTE — Progress Notes (Signed)
 SATURATION QUALIFICATIONS: (This note is used to comply with regulatory documentation for home oxygen )  Patient Saturations on Room Air at Rest = 92%  Patient Saturations on Room Air while Ambulating = 80%  Patient Saturations on 2 Liters of oxygen  while Ambulating = 94%  Please briefly explain why patient needs home oxygen :

## 2024-07-22 NOTE — Progress Notes (Signed)
 [facility]                                                                              Katie [name], is a 78 y.o. female, DOB - [birth year], FMW:[id number] Admit date - 07/16/2024    Outpatient Primary MD for the patient is [name], MD  LOS - 6  days  Chief Complaint  Patient presents with   Shortness of Breath       Brief summary   Patient is a 78 year old female with history of COPD, tobacco abuse, cocaine use, diastolic CHF, hypertension, hyperlipidemia, CKD stage IIIa, CAD, history of severe MR status post mitral valve repair in 04/2019, AAA status post EVAR 01/2022 presented with shortness of breath, respiratory dress, cough, increased wheezing for last 3 days.  Patient reported acute worsening at the time of admission with a chest tightness.  Patient was found to be tachypneic, tachycardiac, respiratory distress, lactic acidosis, placed on BiPAP in ED Chest x-ray showed hazy opacities in the right middle and lower lung suspicious for pneumonia.  Patient has been very slow to recover and hospital stay complicated by atrial fibrillation.  Assessment & Plan      Acute respiratory failure with hypoxia (HCC) -Multifactorial likely due to viral pneumonia, COPD exacerbation, acute on chronic diastolic CHF - CTA chest negative for PE, showed small bilateral pleural effusions, emphysema, Aortic atherosclerotic classification.  - Wean off O2 as able-Home O2 study ordered -RVP- rhinovirus    COPD exacerbation (HCC),    CAP (community acquired pneumonia) - Wean O2 as tolerated - Continue Xopenex , Atrovent  nebs, Pulmicort , Brovana, Yupelri, IV Solu-Medrol . - Finish antibiotic course - Nebs changed to Xopenex  due to tachycardia -Pulmonary consultation appreciated -Continue to wean off oxygen   ? Atrial flutter  vs SVT - Cardizem started with good results - [place] vas 2 at least 4 - Eliquis started by cardiology  Acute on chronic diastolic CHF, history of  CAD, severe MR s/p mitral valve repair in 04/2019 - BNP 7015, received IV Lasix  in ED.  - 2D echo EF 75%, no regional WMA, indeterminate diastolic Parameters. -  UDS positive for cocaine, counseled patient on cocaine cessation, holding off on beta-blocker - Strict I's and O's and daily weights -Lasix  as needed for now - Per cardiology:She had LHC/RHC and valve study on 10/1 and was found to have non-obstructive CAD, normal filling pressures, no PH, and mild MS (MG 5.7 mmHg, MVA 3.2 cm^2).  - Based on our studies, her symptoms are driven by COPD and not CHF. Would PRN lasix  40 mg daily for increase in LE edema and no TEE needed given mild MS.   Lactic acidosis, anion gap metabolic acidosis -Unclear etiology, ABG did not show any respiratory acidosis, no GI losses, not on any medications causing acidosis.  Does not appear to be from sepsis.  -Likely from cocaine.  -Resolved  Hyperthyroidism - New diagnosis, TSH 0.28, free T4 1.34, T3 80 - methimazole 5 mg twice daily - TPO Ab, TSH receptor antibody, ESR 60.  - Thyroid  US  showed moderately heterogeneous thyroid  parenchyma, multiple nodules, none meet criteria for biopsy, may need  RAI uptake scan for further workup outpatient.   - Outpatient referral to endocrinology at discharge for further workup per Dr. Davia  Normocytic anemia/iron deficiency - Hemoglobin 11.0 on admission, trended down to 9.3 on 9/28 -Anemia panel showed iron 17, percent saturation ratio 5, ferritin 97, FOBT pending - H&H is stable/improving-labs pending from today - Recommend outpatient GI workup for iron deficiency anemia  Essential hypertension - Beta-blocker discontinued due to prior history of cocaine use -Blood pressure currently low, so will need close monitoring on Cardizem    AAA (abdominal aortic aneurysm) without rupture - Continue BP control    HLD (hyperlipidemia) -Continue Crestor     Tobacco abuse disorder -Continue nicotine  patch    CKD stage  G3a/A1, GFR 45-59 and albumin  creatinine ratio <30 mg/g (HCC) - Cr at baseline -Lasix  DC'd     QT prolongation -Replace K magnesium  3.0, avoid QT prolonging meds   Estimated body mass index is 21.29 kg/m as calculated from the following:   Height as of this encounter: 5' 1 (1.549 m).   Weight as of this encounter: 51.1 kg.  Code Status: DNR DVT Prophylaxis:  SCDs Start: 07/16/24 2149 apixaban (ELIQUIS) tablet 5 mg   Level of Care: Level of care: Progressive Family Communication: No family at bedside Disposition Plan:      Remains inpatient appropriate: Home with home health once oxygen  has been weaned although if unable to wean patient may need oxygen  at home     Consultants:   Cardiology Pulmonary     Medications  apixaban  5 mg Oral BID   arformoterol  15 mcg Nebulization BID   budesonide  (PULMICORT ) nebulizer solution  0.25 mg Nebulization BID   Chlorhexidine  Gluconate Cloth  6 each Topical Daily   diltiazem  120 mg Oral Daily   guaiFENesin   600 mg Oral BID   hydrALAZINE   10 mg Oral Q8H   levalbuterol   0.63 mg Nebulization TID   And   ipratropium  0.5 mg Nebulization Q6H   loratadine  10 mg Oral Daily   losartan   50 mg Oral Daily   methIMAzole  5 mg Oral BID   methylPREDNISolone  (SOLU-MEDROL ) injection  40 mg Intravenous Q12H   nicotine   14 mg Transdermal Daily   pantoprazole   40 mg Oral Daily   polyethylene glycol  17 g Oral Daily   revefenacin  175 mcg Nebulization Daily   rosuvastatin   40 mg Oral Daily   senna-docusate  1 tablet Oral BID   sodium chloride  flush  10-40 mL Intracatheter Q12H   sodium chloride  flush  3 mL Intravenous Q12H   sodium chloride  flush  3 mL Intravenous Q12H      Subjective:   Tired this morning but overall feels like breathing is better  Objective:   Vitals:   07/22/24 0000 07/22/24 0619 07/22/24 0632 07/22/24 0830  BP: 113/69 134/86    Pulse: 84 90    Resp:  18    Temp:  98.2 F (36.8 C)    TempSrc:  Oral     SpO2:  97%  97%  Weight:   51.1 kg   Height:        Intake/Output Summary (Last 24 hours) at 07/22/2024 1028 Last data filed at 07/22/2024 0832 Gross per 24 hour  Intake 240 ml  Output 900 ml  Net -660 ml     Wt Readings from Last 3 Encounters:  07/22/24 51.1 kg  06/01/24 63.5 kg  05/04/24 63.5 kg   Physical  Exam   General: Appearance:    Well developed, well nourished female in no acute distress     Lungs:   Breathing unlabored, diminished  Heart:    Normal heart rate.     MS:   All extremities are intact.   Neurologic:   Awake, alert, oriented x 3. No apparent focal neurological           defect.      Data Reviewed:  I have personally reviewed following labs    CBC Lab Results  Component Value Date   WBC 6.7 07/22/2024   RBC 3.63 (L) 07/22/2024   HGB 10.7 (L) 07/22/2024   HCT 33.9 (L) 07/22/2024   MCV 93.4 07/22/2024   MCH 29.5 07/22/2024   PLT 251 07/22/2024   MCHC 31.6 07/22/2024   RDW 12.5 07/22/2024   LYMPHSABS 1.2 07/16/2024   MONOABS 0.7 07/16/2024   EOSABS 0.1 07/16/2024   BASOSABS 0.0 07/16/2024     Last metabolic panel Lab Results  Component Value Date   NA 139 07/22/2024   K 4.3 07/22/2024   CL 101 07/22/2024   CO2 27 07/22/2024   BUN 43 (H) 07/22/2024   CREATININE 1.32 (H) 07/22/2024   GLUCOSE 131 (H) 07/22/2024   GFRNONAA 42 (L) 07/22/2024   GFRAA 37 (L) 04/26/2020   CALCIUM  9.3 07/22/2024   PHOS 4.1 07/20/2024   PROT 6.3 (L) 07/17/2024   ALBUMIN  3.5 07/20/2024   BILITOT 0.2 07/17/2024   ALKPHOS 71 07/17/2024   AST 24 07/17/2024   ALT 11 07/17/2024   ANIONGAP 11 07/22/2024    CBG (last 3)  No results for input(s): GLUCAP in the last 72 hours.    Coagulation Profile: Recent Labs  Lab 07/16/24 1940  INR 1.0     Radiology Studies: I have personally reviewed the imaging studies  CARDIAC CATHETERIZATION Result Date: 07/20/2024   Ost RCA to Prox RCA lesion is 30% stenosed.   Mid RCA lesion is 50% stenosed.   Mid Cx  lesion is 20% stenosed.   Prox LAD lesion is 40% stenosed.   Mid LAD-1 lesion is 40% stenosed.   Mid LAD-2 lesion is 50% stenosed. Findings: Ao =125/104 (115) LV = 137/10 RA = 7 RV = 30/7 PA = 24/15 (17) PCW = 10 Fick cardiac output/index = 4.7/3.0 PVR = 1.9 WU Ao sat = 99% PA sat = 72%, 74% PAPi = 1.3 MV data = mean gradient 5.7 mmHG MVA 3.2 cm2 (very mild MS) Assessment: 1. Mild CAD with co-dominant system and separate ostia for LAD and LCX 2. Normal filling pressures with very mild MS Plan/Discussion: Suspect main symptoms due to COPD. Continue medical therapy. Toribio Fuel, MD 6:34 PM  US  EKG SITE RITE Result Date: 07/20/2024 If Site Rite image not attached, placement could not be confirmed due to current cardiac rhythm.      Harlene RAYMOND Bowl DO [facility] 07/22/2024, 10:28 AM  Available via Epic secure chat 7am-7pm After 7 pm, please refer to night coverage provider listed on amion.

## 2024-07-22 NOTE — Plan of Care (Signed)
 Problem: Activity: Goal: Risk for activity intolerance will decrease 07/22/2024 0444 by Joyner-Little, Makeshia Seat, RN Outcome: Progressing 07/22/2024 0441 by Joyner-Little, Clem Wisenbaker, RN Outcome: Progressing   Problem: Safety: Goal: Ability to remain free from injury will improve 07/22/2024 0444 by Nohemi Cornfield, RN Outcome: Progressing 07/22/2024 0441 by Joyner-Little, Yacine Garriga, RN Outcome: Progressing   Problem: Respiratory: Goal: Ability to maintain a clear airway will improve Outcome: Progressing Goal: Levels of oxygenation will improve Outcome: Progressing Goal: Ability to maintain adequate ventilation will improve Outcome: Progressing

## 2024-07-23 DIAGNOSIS — J9601 Acute respiratory failure with hypoxia: Secondary | ICD-10-CM | POA: Diagnosis not present

## 2024-07-23 LAB — DRUG PROFILE, UR, 9 DRUGS (LABCORP)
Amphetamines, Urine: NEGATIVE ng/mL
Barbiturate, Ur: NEGATIVE ng/mL
Benzodiazepine Quant, Ur: NEGATIVE ng/mL
Cannabinoid Quant, Ur: NEGATIVE ng/mL
Creatinine, Urine: 49.5 mg/dL (ref 20.0–300.0)
Methadone Screen, Urine: NEGATIVE ng/mL
Nitrite Urine, Quantitative: NEGATIVE ug/mL
OPIATE SCREEN URINE: NEGATIVE ng/mL
Phencyclidine, Ur: NEGATIVE ng/mL
Propoxyphene, Urine: NEGATIVE ng/mL
pH, Urine: 5.6 (ref 4.5–8.9)

## 2024-07-23 LAB — COCAINE CONF, UR
Benzoylecgonine GC/MS Conf: 37440 ng/mL
Cocaine Metab Quant, Ur: POSITIVE — AB

## 2024-07-23 MED ORDER — GUAIFENESIN ER 600 MG PO TB12
1200.0000 mg | ORAL_TABLET | Freq: Two times a day (BID) | ORAL | Status: DC
  Administered 2024-07-23 – 2024-07-25 (×4): 1200 mg via ORAL
  Filled 2024-07-23 (×4): qty 2

## 2024-07-23 MED ORDER — SALINE SPRAY 0.65 % NA SOLN
1.0000 | NASAL | Status: DC | PRN
  Administered 2024-07-23: 1 via NASAL
  Filled 2024-07-23: qty 44

## 2024-07-23 MED ORDER — LOSARTAN POTASSIUM 25 MG PO TABS
25.0000 mg | ORAL_TABLET | Freq: Every day | ORAL | Status: DC
  Administered 2024-07-24 – 2024-07-25 (×2): 25 mg via ORAL
  Filled 2024-07-23 (×2): qty 1

## 2024-07-23 NOTE — Progress Notes (Addendum)
 Physical Therapy Treatment Patient Details Name: Katie [name] MRN: [id number] DOB: Mar 29, 1946 Today's Date: 07/23/2024   History of Present Illness 78 yo female admitted with acute respiratory failure, COPD exac, acute on chronic HF, bil pleural effuisons. Hx of CKD, severe MVRs/p repair 2020, AAA s/p stent graft 2023, cocaine use, HF, COPD, renal cell ca, CAD    PT Comments  Pt expressed a desire to go to Rehab that her Addict Son won't help her. Pt has some anxiety/fear about going home.  He has only called me once to see how I'm doing.  There's no telling what condition the house is in.    AxO x 3 pleasant and willing.  Living home with Son who is an adict past 6 years.  She stated I wish he would move out.  He doesn't do a thing to help me. Prior to admit she amb IND and driving. Pt has been widowed 10 years.  Pt also vowed to quit smoking.    Pt progressing with her mobility.  General Gait Details: tolerated amb 275 feet x 2 with Rollator at one seated rest break on 2 lts with avg sats at 96% and 2/4 DOE.  Instructed on purse lip breathing and pacing herself.  Mild dry cough.  Improving slowly.  Pt also amb earlier with nursing.  LPT has rec HH PT and a Rollator.    If plan is discharge home, recommend the following: A little help with walking and/or transfers;A little help with bathing/dressing/bathroom;Assistance with cooking/housework;Assist for transportation;Help with stairs or ramp for entrance   Can travel by private vehicle        Equipment Recommendations  Rollator (4 wheels)    Recommendations for Other Services       Precautions / Restrictions Precautions Precautions: Fall Precaution/Restrictions Comments: monitor O2, HR Restrictions Weight Bearing Restrictions Per Provider Order: No     Mobility  Bed Mobility Overal bed mobility: Modified Independent             General bed mobility comments: self able    Transfers Overall transfer  level: Modified independent Equipment used: None               General transfer comment: self able with good safety cognition and awareness of tele box and oxygen  cord.    Ambulation/Gait Ambulation/Gait assistance: Supervision Gait Distance (Feet): 550 Feet Assistive device: Rollator (4 wheels) Gait Pattern/deviations: Step-through pattern, Decreased stride length Gait velocity: decreased     General Gait Details: tolerated amb 275 feet x 2 with Rollator at one seated rest break on 2 lts with avg sats at 96% and 2/4 DOE.  Instructed on purse lip breathing and pacing herself.  Mild dry cough.  Improving slowly.   Stairs             Wheelchair Mobility     Tilt Bed    Modified Rankin (Stroke Patients Only)       Balance                                            Communication Communication Communication: No apparent difficulties  Cognition Arousal: Alert Behavior During Therapy: WFL for tasks assessed/performed   PT - Cognitive impairments: No apparent impairments                       PT -  Cognition Comments: AxO x 3 pleasant and willing.  Living home with Son who is an adict past 6 years.  She stated I wish he would move out.  He doesn't do a thing to help me. She amb IND and driving. Following commands: Intact      Cueing Cueing Techniques: Verbal cues  Exercises      General Comments        Pertinent Vitals/Pain Pain Assessment Pain Assessment: No/denies pain    Home Living                          Prior Function            PT Goals (current goals can now be found in the care plan section) Progress towards PT goals: Progressing toward goals    Frequency    Min 3X/week      PT Plan      Co-evaluation              AM-PAC PT 6 Clicks Mobility   Outcome Measure  Help needed turning from your back to your side while in a flat bed without using bedrails?: None Help needed  moving from lying on your back to sitting on the side of a flat bed without using bedrails?: None Help needed moving to and from a bed to a chair (including a wheelchair)?: None Help needed standing up from a chair using your arms (e.g., wheelchair or bedside chair)?: None Help needed to walk in hospital room?: None Help needed climbing 3-5 steps with a railing? : A Little 6 Click Score: 23    End of Session Equipment Utilized During Treatment: Gait belt Activity Tolerance: Patient tolerated treatment well Patient left: in bed;with call bell/phone within reach;with bed alarm set Nurse Communication: Mobility status PT Visit Diagnosis: Muscle weakness (generalized) (M62.81);Difficulty in walking, not elsewhere classified (R26.2);Unsteadiness on feet (R26.81)     Time: 8542-8477 PT Time Calculation (min) (ACUTE ONLY): 25 min  Charges:    $Gait Training: 8-22 mins $Therapeutic Activity: 8-22 mins PT General Charges $$ ACUTE PT VISIT: 1 Visit                     Katheryn Leap  PTA Acute  Rehabilitation Services Office M-F          [phone]

## 2024-07-23 NOTE — Progress Notes (Signed)
   07/23/24 2328  BiPAP/CPAP/SIPAP  Reason BIPAP/CPAP not in use Non-compliant (PATIENT STATED DURING FIRST ROUNDS THAT SHE DID NOT WANT TO WEAR THE BIPAP TONIGHT. SHE STATED THAT MAYBE SHE WILL WEAR IT TOMORROW NIGHT.)  BiPAP/CPAP /SiPAP Vitals  Resp 18  MEWS Score/Color  MEWS Score 1  MEWS Score Color Green

## 2024-07-23 NOTE — Progress Notes (Signed)
 [facility]                                                                              Katie [name], is a 78 y.o. female, DOB - 1946-08-31, FMW:[id number] Admit date - 07/16/2024    Outpatient Primary MD for the patient is Katie Leni Edyth DELENA, MD  LOS - 7  days  Chief Complaint  Patient presents with   Shortness of Breath       Brief summary   Patient is a 78 year old female with history of COPD, tobacco abuse, cocaine use, diastolic CHF, hypertension, hyperlipidemia, CKD stage IIIa, CAD, history of severe MR status post mitral valve repair in 04/2019, AAA status post EVAR 01/2022 presented with shortness of breath, respiratory dress, cough, increased wheezing for last 3 days.  Patient reported acute worsening at the time of admission with a chest tightness.  Patient was found to be tachypneic, tachycardiac, respiratory distress, lactic acidosis, placed on BiPAP in ED Chest x-ray showed hazy opacities in the right middle and lower lung suspicious for pneumonia.  Patient has been very slow to recover and hospital stay complicated by atrial fibrillation.  Assessment & Plan      Acute respiratory failure with hypoxia (HCC) -Multifactorial likely due to viral pneumonia, COPD exacerbation, acute on chronic diastolic CHF - CTA chest negative for PE, showed small bilateral pleural effusions, emphysema, Aortic atherosclerotic classification.  - Wean off O2 as able-Home O2 study ordered -RVP- rhinovirus    COPD exacerbation (HCC),    CAP (community acquired pneumonia) - Wean O2 as tolerated - Continue Xopenex , Atrovent  nebs, Pulmicort , Brovana, Yupelri, IV Solu-Medrol . - Finish antibiotic course - Nebs changed to Xopenex  due to tachycardia -Pulmonary consultation appreciated -Continue to wean off oxygen -Home O2 study  ? Atrial flutter  vs SVT - Cardizem started with good results - [place] vas 2 at least 4 - Eliquis started by cardiology  Acute on chronic diastolic  CHF, history of CAD, severe MR s/p mitral valve repair in 04/2019 - BNP 7015, received IV Lasix  in ED.  - 2D echo EF 75%, no regional WMA, indeterminate diastolic Parameters. -  UDS positive for cocaine, counseled patient on cocaine cessation, holding off on beta-blocker - Strict I's and O's and daily weights -Lasix  as needed for now - Per cardiology:She had LHC/RHC and valve study on 10/1 and was found to have non-obstructive CAD, normal filling pressures, no PH, and mild MS (MG 5.7 mmHg, MVA 3.2 cm^2).  - Based on our studies, her symptoms are driven by COPD and not CHF. Would PRN lasix  40 mg daily for increase in LE edema and no TEE needed given mild MS.   Lactic acidosis, anion gap metabolic acidosis -Unclear etiology, ABG did not show any respiratory acidosis, no GI losses, not on any medications causing acidosis.  Does not appear to be from sepsis.  -Likely from cocaine.  -Resolved  Hyperthyroidism - New diagnosis, TSH 0.28, free T4 1.34, T3 80 - methimazole 5 mg twice daily - TPO Ab, TSH receptor antibody, ESR 60.  - Thyroid  US  showed moderately heterogeneous thyroid  parenchyma, multiple nodules, none meet criteria for biopsy,  may need RAI uptake scan for further workup outpatient.   - Outpatient referral to endocrinology at discharge for further workup per Dr. Davia  Normocytic anemia/iron deficiency - Hemoglobin 11.0 on admission, trended down to 9.3 on 9/28 -Anemia panel showed iron 17, percent saturation ratio 5, ferritin 97, FOBT pending - H&H is stable/improving-labs pending from today - Recommend outpatient GI workup for iron deficiency anemia  Essential hypertension - Beta-blocker discontinued due to prior history of cocaine use -Blood pressure currently low, so will need close monitoring on Cardizem    AAA (abdominal aortic aneurysm) without rupture - Continue BP control    HLD (hyperlipidemia) -Continue Crestor     Tobacco abuse disorder -Continue nicotine  patch     CKD stage G3a/A1, GFR 45-59 and albumin  creatinine ratio <30 mg/g (HCC) - Cr at baseline -Lasix  DC'd     QT prolongation -Replace K magnesium  3.0, avoid QT prolonging meds   Estimated body mass index is 24.75 kg/m as calculated from the following:   Height as of this encounter: 5' 1 (1.549 m).   Weight as of this encounter: 59.4 kg.  Code Status: DNR DVT Prophylaxis:  SCDs Start: 07/16/24 2149 apixaban (ELIQUIS) tablet 5 mg   Level of Care: Level of care: Telemetry Family Communication: No family at bedside-called daughter Disposition Plan:      Remains inpatient appropriate: Home with home health once oxygen  has been weaned although if unable to wean patient may need oxygen  at home     Consultants:   Cardiology Pulmonary     Medications  apixaban  5 mg Oral BID   arformoterol  15 mcg Nebulization BID   budesonide  (PULMICORT ) nebulizer solution  0.25 mg Nebulization BID   Chlorhexidine  Gluconate Cloth  6 each Topical Daily   diltiazem  120 mg Oral Daily   guaiFENesin   1,200 mg Oral BID   levalbuterol   0.63 mg Nebulization Q6H   And   ipratropium  0.5 mg Nebulization Q6H   loratadine  10 mg Oral Daily   [START ON 07/24/2024] losartan   25 mg Oral Daily   methIMAzole  5 mg Oral BID   methylPREDNISolone  (SOLU-MEDROL ) injection  40 mg Intravenous Q12H   nicotine   14 mg Transdermal Daily   pantoprazole   40 mg Oral Daily   polyethylene glycol  17 g Oral Daily   revefenacin  175 mcg Nebulization Daily   rosuvastatin   40 mg Oral Daily   senna-docusate  1 tablet Oral BID   sodium chloride  flush  10-40 mL Intracatheter Q12H   sodium chloride  flush  3 mL Intravenous Q12H   sodium chloride  flush  3 mL Intravenous Q12H      Subjective:   Worried about going home, thinks she needs to go to rehab  Objective:   Vitals:   07/23/24 0743 07/23/24 0910 07/23/24 1057 07/23/24 1252  BP:  (!) 97/57 128/80 107/67  Pulse: 87 100  89  Resp: 17 19 (!) 23 (!) 21  Temp:  98.5  F (36.9 C)  98.4 F (36.9 C)  TempSrc:  Oral  Oral  SpO2: 100% 100%  99%  Weight:      Height:        Intake/Output Summary (Last 24 hours) at 07/23/2024 1334 Last data filed at 07/23/2024 1011 Gross per 24 hour  Intake 840 ml  Output 650 ml  Net 190 ml     Wt Readings from Last 3 Encounters:  07/23/24 59.4 kg  06/01/24 63.5 kg  05/04/24 63.5  kg   Physical Exam   General: Appearance:    Well developed, well nourished female in no acute distress     Lungs:   Breathing unlabored, diminished  Heart:    Normal heart rate.     MS:   All extremities are intact.   Neurologic:   Awake, alert, oriented x 3. No apparent focal neurological           defect.      Data Reviewed:  I have personally reviewed following labs    CBC Lab Results  Component Value Date   WBC 6.7 07/22/2024   RBC 3.63 (L) 07/22/2024   HGB 10.7 (L) 07/22/2024   HCT 33.9 (L) 07/22/2024   MCV 93.4 07/22/2024   MCH 29.5 07/22/2024   PLT 251 07/22/2024   MCHC 31.6 07/22/2024   RDW 12.5 07/22/2024   LYMPHSABS 1.2 07/16/2024   MONOABS 0.7 07/16/2024   EOSABS 0.1 07/16/2024   BASOSABS 0.0 07/16/2024     Last metabolic panel Lab Results  Component Value Date   NA 139 07/22/2024   K 4.3 07/22/2024   CL 101 07/22/2024   CO2 27 07/22/2024   BUN 43 (H) 07/22/2024   CREATININE 1.32 (H) 07/22/2024   GLUCOSE 131 (H) 07/22/2024   GFRNONAA 42 (L) 07/22/2024   GFRAA 37 (L) 04/26/2020   CALCIUM  9.3 07/22/2024   PHOS 4.1 07/20/2024   PROT 6.3 (L) 07/17/2024   ALBUMIN  3.5 07/20/2024   BILITOT 0.2 07/17/2024   ALKPHOS 71 07/17/2024   AST 24 07/17/2024   ALT 11 07/17/2024   ANIONGAP 11 07/22/2024    CBG (last 3)  No results for input(s): GLUCAP in the last 72 hours.    Coagulation Profile: Recent Labs  Lab 07/16/24 1940  INR 1.0     Radiology Studies: I have personally reviewed the imaging studies  No results found.      Harlene RAYMOND Bowl DO [facility] 07/23/2024, 1:34  PM  Available via Epic secure chat 7am-7pm After 7 pm, please refer to night coverage provider listed on amion.

## 2024-07-23 NOTE — Plan of Care (Incomplete)
  Problem: Health Behavior/Discharge Planning: Goal: Ability to manage health-related needs will improve Outcome: Progressing   Problem: Clinical Measurements: Goal: Ability to maintain clinical measurements within normal limits will improve Outcome: Progressing Goal: Will remain free from infection Outcome: Progressing Goal: Diagnostic test results will improve Outcome: Progressing Goal: Respiratory complications will improve Outcome: Progressing Goal: Cardiovascular complication will be avoided Outcome: Progressing   Problem: Activity: Goal: Risk for activity intolerance will decrease Outcome: Progressing   Problem: Nutrition: Goal: Adequate nutrition will be maintained Outcome: Progressing   Problem: Coping: Goal: Level of anxiety will decrease Outcome: Progressing   Problem: Education: Goal: Knowledge of disease or condition will improve Outcome: Progressing   Problem: Activity: Goal: Ability to tolerate increased activity will improve Outcome: Progressing   Problem: Respiratory: Goal: Ability to maintain a clear airway will improve Outcome: Progressing   Problem: Respiratory: Goal: Levels of oxygenation will improve Outcome: Progressing Goal: Ability to maintain adequate ventilation will improve Outcome: Progressing   Problem: Education: Goal: Knowledge of General Education information will improve Description: Including pain rating scale, medication(s)/side effects and non-pharmacologic comfort measures Outcome: Adequate for Discharge   Problem: Elimination: Goal: Will not experience complications related to bowel motility Outcome: Adequate for Discharge Goal: Will not experience complications related to urinary retention Outcome: Adequate for Discharge   Problem: Pain Managment: Goal: General experience of comfort will improve and/or be controlled Outcome: Adequate for Discharge   Problem: Safety: Goal: Ability to remain free from injury will  improve Outcome: Adequate for Discharge   Problem: Skin Integrity: Goal: Risk for impaired skin integrity will decrease Outcome: Adequate for Discharge   Problem: Respiratory: Goal: Levels of oxygenation will improve Outcome: Adequate for Discharge Goal: Ability to maintain adequate ventilation will improve Outcome: Adequate for Discharge   Problem: Respiratory: Goal: Ability to maintain a clear airway will improve Outcome: Adequate for Discharge

## 2024-07-23 NOTE — Progress Notes (Signed)
 SATURATION QUALIFICATIONS: (This note is used to comply with regulatory documentation for home oxygen )  Patient Saturations on Room Air at Rest = 97%  Patient Saturations on Room Air while Ambulating = 93%  Patient Saturations on 2 Liters of oxygen  while Ambulating = 98%  Please briefly explain why patient needs home oxygen :

## 2024-07-24 DIAGNOSIS — J9601 Acute respiratory failure with hypoxia: Secondary | ICD-10-CM | POA: Diagnosis not present

## 2024-07-24 LAB — CULTURE, RESPIRATORY W GRAM STAIN
Culture: NORMAL
Special Requests: NORMAL

## 2024-07-24 MED ORDER — LEVALBUTEROL HCL 0.63 MG/3ML IN NEBU: 0.6300 mg | INHALATION_SOLUTION | Freq: Three times a day (TID) | RESPIRATORY_TRACT | Status: DC

## 2024-07-24 MED ORDER — IPRATROPIUM BROMIDE 0.02 % IN SOLN: 0.5000 mg | Freq: Four times a day (QID) | RESPIRATORY_TRACT | Status: DC

## 2024-07-24 MED ORDER — LEVALBUTEROL HCL 0.63 MG/3ML IN NEBU
0.6300 mg | INHALATION_SOLUTION | Freq: Three times a day (TID) | RESPIRATORY_TRACT | Status: DC
  Administered 2024-07-24 – 2024-07-25 (×4): 0.63 mg via RESPIRATORY_TRACT
  Filled 2024-07-24 (×5): qty 3

## 2024-07-24 MED ORDER — IPRATROPIUM BROMIDE 0.02 % IN SOLN
0.5000 mg | Freq: Three times a day (TID) | RESPIRATORY_TRACT | Status: DC
  Administered 2024-07-24 – 2024-07-25 (×5): 0.5 mg via RESPIRATORY_TRACT
  Filled 2024-07-24 (×5): qty 2.5

## 2024-07-24 NOTE — Progress Notes (Signed)
 [facility]                                                                              Dione Petron, is a 78 y.o. female, DOB - 1946-07-03, FMW:[id number] Admit date - 07/16/2024    Outpatient Primary MD for the patient is [name], MD  LOS - 8  days  Chief Complaint  Patient presents with   Shortness of Breath       Brief summary   Patient is a 78 year old female with history of COPD, tobacco abuse, cocaine use, diastolic CHF, hypertension, hyperlipidemia, CKD stage IIIa, CAD, history of severe MR status post mitral valve repair in 04/2019, AAA status post EVAR 01/2022 presented with shortness of breath, respiratory dress, cough, increased wheezing for last 3 days.  Patient reported acute worsening at the time of admission with a chest tightness.  Patient was found to be tachypneic, tachycardiac, respiratory distress, lactic acidosis, placed on BiPAP in ED Chest x-ray showed hazy opacities in the right middle and lower lung suspicious for pneumonia.  Patient has been very slow to recover and hospital stay complicated by atrial fibrillation.  Assessment & Plan      Acute respiratory failure with hypoxia (HCC) -Multifactorial likely due to viral pneumonia, COPD exacerbation, acute on chronic diastolic CHF - CTA chest negative for PE, showed small bilateral pleural effusions, emphysema, Aortic atherosclerotic classification.  - Wean off O2 as able-Home O2 study ordered- may not qualify for O2 at home -RVP- rhinovirus    COPD exacerbation (HCC),    CAP (community acquired pneumonia) - Wean O2 as tolerated - Continue Xopenex , Atrovent  nebs, Pulmicort , Brovana, Yupelri, IV Solu-Medrol . - Finish antibiotic course - Nebs changed to Xopenex  due to tachycardia -Pulmonary consultation appreciated -Continue to wean off oxygen -Home O2 study  ? Atrial flutter  vs SVT - Cardizem started with good results - [place] vas 2 at least 4 - Eliquis started by  cardiology  Acute on chronic diastolic CHF, history of CAD, severe MR s/p mitral valve repair in 04/2019 - BNP 7015, received IV Lasix  in ED.  - 2D echo EF 75%, no regional WMA, indeterminate diastolic Parameters. -  UDS positive for cocaine, counseled patient on cocaine cessation, holding off on beta-blocker - Strict I's and O's and daily weights -Lasix  as needed for now - Per cardiology:She had LHC/RHC and valve study on 10/1 and was found to have non-obstructive CAD, normal filling pressures, no PH, and mild MS (MG 5.7 mmHg, MVA 3.2 cm^2).  - Based on our studies, her symptoms are driven by COPD and not CHF. Would PRN lasix  40 mg daily for increase in LE edema and no TEE needed given mild MS.   Lactic acidosis, anion gap metabolic acidosis -Unclear etiology, ABG did not show any respiratory acidosis, no GI losses, not on any medications causing acidosis.  Does not appear to be from sepsis.  -Likely from cocaine.  -Resolved  Hyperthyroidism - New diagnosis, TSH 0.28, free T4 1.34, T3 80 - methimazole 5 mg twice daily - TPO Ab, TSH receptor antibody, ESR 60.  - Thyroid  US  showed moderately heterogeneous thyroid  parenchyma,  multiple nodules, none meet criteria for biopsy, may need RAI uptake scan for further workup outpatient.   - Outpatient referral to endocrinology at discharge for further workup per Dr. Davia  Normocytic anemia/iron deficiency - Hemoglobin 11.0 on admission, trended down to 9.3 on 9/28 -Anemia panel showed iron 17, percent saturation ratio 5, ferritin 97, FOBT pending - H&H is stable/improving-labs pending from today - Recommend outpatient GI workup for iron deficiency anemia  Essential hypertension - Beta-blocker discontinued due to prior history of cocaine use - Avoid hypotension so we will have adjusted blood pressure medications as Cardizem was added    AAA (abdominal aortic aneurysm) without rupture - Continue BP control    HLD (hyperlipidemia) -Continue  Crestor     Tobacco abuse disorder -Continue nicotine  patch    CKD stage G3a/A1, GFR 45-59 and albumin  creatinine ratio <30 mg/g (HCC) - Cr at baseline -Lasix  DC'd     QT prolongation -Replace K magnesium  3.0, avoid QT prolonging meds   Estimated body mass index is 25.37 kg/m as calculated from the following:   Height as of this encounter: 5' 1 (1.549 m).   Weight as of this encounter: 60.9 kg.  Code Status: DNR DVT Prophylaxis:  SCDs Start: 07/16/24 2149 apixaban (ELIQUIS) tablet 5 mg   Level of Care: Level of care: Telemetry Family Communication: No family at bedside-called daughter 10/4 Disposition Plan:      Remains inpatient appropriate: Home with home health once oxygen  has been weaned although if unable to wean patient may need oxygen  at home     Consultants:   Cardiology Pulmonary     Medications  apixaban  5 mg Oral BID   arformoterol  15 mcg Nebulization BID   budesonide  (PULMICORT ) nebulizer solution  0.25 mg Nebulization BID   Chlorhexidine  Gluconate Cloth  6 each Topical Daily   diltiazem  120 mg Oral Daily   guaiFENesin   1,200 mg Oral BID   levalbuterol   0.63 mg Nebulization TID   And   ipratropium  0.5 mg Nebulization TID   loratadine  10 mg Oral Daily   losartan   25 mg Oral Daily   methIMAzole  5 mg Oral BID   methylPREDNISolone  (SOLU-MEDROL ) injection  40 mg Intravenous Q12H   nicotine   14 mg Transdermal Daily   pantoprazole   40 mg Oral Daily   polyethylene glycol  17 g Oral Daily   revefenacin  175 mcg Nebulization Daily   rosuvastatin   40 mg Oral Daily   senna-docusate  1 tablet Oral BID   sodium chloride  flush  10-40 mL Intracatheter Q12H   sodium chloride  flush  3 mL Intravenous Q12H   sodium chloride  flush  3 mL Intravenous Q12H      Subjective:   Thinks she started to feel better, hoping she can go home in the a.m.   Objective:   Vitals:   07/24/24 0756 07/24/24 0800 07/24/24 0905 07/24/24 1000  BP:      Pulse:       Resp: 18 20 (!) 23 (!) 24  Temp:      TempSrc:      SpO2: 97%     Weight:      Height:        Intake/Output Summary (Last 24 hours) at 07/24/2024 1105 Last data filed at 07/24/2024 0920 Gross per 24 hour  Intake 1203 ml  Output 900 ml  Net 303 ml     Wt Readings from Last 3 Encounters:  07/24/24 60.9 kg  06/01/24 63.5 kg  05/04/24 63.5 kg   Physical Exam    General: Appearance:     Overweight female in no acute distress     Lungs:   Expiratory wheeze only respirations unlabored  Heart:    Normal heart rate.     MS:   All extremities are intact.   Neurologic:   Awake, alert     Data Reviewed:  I have personally reviewed following labs    CBC Lab Results  Component Value Date   WBC 6.7 07/22/2024   RBC 3.63 (L) 07/22/2024   HGB 10.7 (L) 07/22/2024   HCT 33.9 (L) 07/22/2024   MCV 93.4 07/22/2024   MCH 29.5 07/22/2024   PLT 251 07/22/2024   MCHC 31.6 07/22/2024   RDW 12.5 07/22/2024   LYMPHSABS 1.2 07/16/2024   MONOABS 0.7 07/16/2024   EOSABS 0.1 07/16/2024   BASOSABS 0.0 07/16/2024     Last metabolic panel Lab Results  Component Value Date   NA 139 07/22/2024   K 4.3 07/22/2024   CL 101 07/22/2024   CO2 27 07/22/2024   BUN 43 (H) 07/22/2024   CREATININE 1.32 (H) 07/22/2024   GLUCOSE 131 (H) 07/22/2024   GFRNONAA 42 (L) 07/22/2024   GFRAA 37 (L) 04/26/2020   CALCIUM  9.3 07/22/2024   PHOS 4.1 07/20/2024   PROT 6.3 (L) 07/17/2024   ALBUMIN  3.5 07/20/2024   BILITOT 0.2 07/17/2024   ALKPHOS 71 07/17/2024   AST 24 07/17/2024   ALT 11 07/17/2024   ANIONGAP 11 07/22/2024    CBG (last 3)  No results for input(s): GLUCAP in the last 72 hours.    Coagulation Profile: No results for input(s): INR, PROTIME in the last 168 hours.    Radiology Studies: I have personally reviewed the imaging studies  No results found.      Harlene RAYMOND Bowl DO [facility] 07/24/2024, 11:05 AM  Available via Epic secure chat 7am-7pm After 7 pm,  please refer to night coverage provider listed on amion.

## 2024-07-24 NOTE — Plan of Care (Signed)

## 2024-07-24 NOTE — Progress Notes (Signed)
 SATURATION QUALIFICATIONS: (This note is used to comply with regulatory documentation for home oxygen )  Patient Saturations on Room Air at Rest = 95%  Patient Saturations on Room Air while Ambulating = 91%  Patient Saturations on n/a Liters of oxygen  while Ambulating = n/a%  Please briefly explain why patient needs home oxygen : Pt. Experienced some shortness of breath while ambulating but oxygen  remained 91% or better and when placed back at rest quickly recovered back to 95%

## 2024-07-25 ENCOUNTER — Other Ambulatory Visit: Payer: Self-pay

## 2024-07-25 ENCOUNTER — Other Ambulatory Visit (HOSPITAL_COMMUNITY): Payer: Self-pay

## 2024-07-25 DIAGNOSIS — J9601 Acute respiratory failure with hypoxia: Secondary | ICD-10-CM | POA: Diagnosis not present

## 2024-07-25 MED ORDER — NICOTINE 14 MG/24HR TD PT24
14.0000 mg | MEDICATED_PATCH | Freq: Every day | TRANSDERMAL | 0 refills | Status: AC
  Filled 2024-07-25: qty 28, 28d supply, fill #0

## 2024-07-25 MED ORDER — METHIMAZOLE 5 MG PO TABS
5.0000 mg | ORAL_TABLET | Freq: Two times a day (BID) | ORAL | 0 refills | Status: AC
  Filled 2024-07-25: qty 60, 30d supply, fill #0

## 2024-07-25 MED ORDER — GUAIFENESIN ER 600 MG PO TB12: 1200.0000 mg | ORAL_TABLET | Freq: Two times a day (BID) | ORAL | Status: DC

## 2024-07-25 MED ORDER — APIXABAN 5 MG PO TABS
5.0000 mg | ORAL_TABLET | Freq: Two times a day (BID) | ORAL | 1 refills | Status: DC
  Filled 2024-07-25: qty 60, 30d supply, fill #0

## 2024-07-25 MED ORDER — PREDNISONE 50 MG PO TABS
50.0000 mg | ORAL_TABLET | Freq: Every day | ORAL | Status: DC
  Administered 2024-07-25: 50 mg via ORAL
  Filled 2024-07-25: qty 1

## 2024-07-25 MED ORDER — PREDNISONE 10 MG PO TABS
ORAL_TABLET | ORAL | 0 refills | Status: DC
  Filled 2024-07-25: qty 45, 15d supply, fill #0

## 2024-07-25 MED ORDER — LEVALBUTEROL HCL 0.63 MG/3ML IN NEBU
0.6300 mg | INHALATION_SOLUTION | RESPIRATORY_TRACT | 0 refills | Status: AC | PRN
  Filled 2024-07-25: qty 75, 3d supply, fill #0

## 2024-07-25 MED ORDER — PREDNISONE 50 MG PO TABS: 50.0000 mg | ORAL_TABLET | Freq: Every day | ORAL | Status: DC

## 2024-07-25 MED ORDER — LOSARTAN POTASSIUM 25 MG PO TABS
25.0000 mg | ORAL_TABLET | Freq: Every day | ORAL | 0 refills | Status: AC
  Filled 2024-07-25: qty 30, 30d supply, fill #0

## 2024-07-25 MED ORDER — LORATADINE 10 MG PO TABS
10.0000 mg | ORAL_TABLET | Freq: Every day | ORAL | 0 refills | Status: AC
  Filled 2024-07-25: qty 30, 30d supply, fill #0

## 2024-07-25 MED ORDER — DILTIAZEM HCL ER COATED BEADS 120 MG PO CP24
120.0000 mg | ORAL_CAPSULE | Freq: Every day | ORAL | 0 refills | Status: AC
  Filled 2024-07-25: qty 30, 30d supply, fill #0

## 2024-07-25 NOTE — Progress Notes (Signed)
 SATURATION QUALIFICATIONS: (This note is used to comply with regulatory documentation for home oxygen )  Patient Saturations on Room Air at Rest = 95%  Patient Saturations on Room Air while Ambulating = 90 to 93%  Patient Saturations on n/a Liters of oxygen  while Ambulating = n/a%  Please briefly explain why patient needs home oxygen :
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
## 2024-07-25 NOTE — Discharge Summary (Signed)
 Physician Discharge Summary  Katie [name] FMW:[id number] DOB: 1946-05-26 DOA: 07/16/2024  PCP: Katie Leni Edyth DELENA, MD  Admit date: 07/16/2024 Discharge date: 07/25/2024  Admitted From:  Discharge disposition: Home   Recommendations for Outpatient Follow-Up:   Needs to stop using cocaine and smoking CBC/BMP 1 week-can add as needed Lasix  if labs stable Outpatient referral to GI Outpatient referral to endocrinology for hyperthyroidism   Discharge Diagnosis:   Principal Problem:   Acute respiratory failure with hypoxia (HCC) Active Problems:   COPD exacerbation (HCC)   CAP (community acquired pneumonia)   AAA (abdominal aortic aneurysm) without rupture   Primary hypertension   HLD (hyperlipidemia)   Tobacco abuse disorder   Chest pain of uncertain etiology   CKD stage G3a/A1, GFR 45-59 and albumin  creatinine ratio <30 mg/g (HCC)   Acute on chronic diastolic CHF (congestive heart failure) (HCC)   QT prolongation   Hypokalemia   Normocytic anemia   Elevated d-dimer   Coronary artery disease due to lipid rich plaque   Mitral valve disease   Polysubstance abuse (HCC)   AKI (acute kidney injury)   SVT (supraventricular tachycardia)    Discharge Condition: Improved.  Diet recommendation: Low sodium, heart healthy  Wound care: None.  Code status: Full.   History of Present Illness:   Katie [name] is a 78 y.o. female with medical history significant of COPD, tobacco abuse, AAA with graft.  Diastolic CHF, tobacco abuse, HTN, HLD, CKD stage IIIa,     Presented with shortness of breath cough increased wheezing Patient with known COPD came in with SOB and cough for 3 days EMS gave duoneb  Started on rocephin  and doxy  CXR showed PNA  Noted to be tachycardic and tachypnea Did not do well on BiPAP for rest  No hypercarbia Initial lactic acid 3   Hospital Course by Problem:   Acute respiratory failure with hypoxia (HCC) -Multifactorial likely due to  viral pneumonia, COPD exacerbation, acute on chronic diastolic CHF - CTA chest negative for PE, showed small bilateral pleural effusions, emphysema, Aortic atherosclerotic classification.  - Patient is been weaned to room air has been ambulated several times with mild drops in O2 saturations but none less than 90 -RVP- rhinovirus-suspect this is complicated patient's continued shortness of breath.     COPD exacerbation (HCC),    CAP (community acquired pneumonia) - Wean O2 as tolerated - Continue Xopenex , Atrovent  nebs, Pulmicort , Brovana, Yupelri, IV Solu-Medrol -changed to p.o. steroids and do slow taper - Please follow-up with pulmonology   ? Atrial flutter  vs SVT - Cardizem started with good results - [place] vas 2 at least 4 - Eliquis started by cardiology-follow-up with cardiology   Acute on chronic diastolic CHF, history of CAD, severe MR s/p mitral valve repair in 04/2019 - BNP 7015, received IV Lasix  in ED.  - 2D echo EF 75%, no regional WMA, indeterminate diastolic Parameters. -  UDS positive for cocaine, counseled patient on cocaine cessation, holding off on beta-blocker - Strict I's and O's and daily weights -Lasix  as needed for now - Per cardiology:She had LHC/RHC and valve study on 10/1 and was found to have non-obstructive CAD, normal filling pressures, no PH, and mild MS (MG 5.7 mmHg, MVA 3.2 cm^2).  - Based on our studies, her symptoms are driven by COPD and not CHF. Would PRN lasix  40 mg daily for increase in LE edema and no TEE needed given mild MS.    Lactic acidosis, anion gap  metabolic acidosis -Unclear etiology, ABG did not show any respiratory acidosis, no GI losses, not on any medications causing acidosis.  Does not appear to be from sepsis.  -Likely from cocaine.  -Resolved   Hyperthyroidism - New diagnosis, TSH 0.28, free T4 1.34, T3 80 - methimazole 5 mg twice daily - TPO Ab, TSH receptor antibody, ESR 60.  - Thyroid  US  showed moderately heterogeneous thyroid   parenchyma, multiple nodules, none meet criteria for biopsy, may need RAI uptake scan for further workup outpatient.   - Outpatient referral to endocrinology at discharge for further workup per Dr. Davia   Normocytic anemia/iron deficiency - Hemoglobin 11.0 on admission, trended down to 9.3 on 9/28 -Anemia panel showed iron 17, percent saturation ratio 5, ferritin 97, FOBT pending - H&H is stable/improving-labs pending from today - Recommend outpatient GI workup for iron deficiency anemia   Essential hypertension - Beta-blocker discontinued due to prior history of cocaine use - Avoid hypotension so we will have adjusted blood pressure medications as Cardizem was added     AAA (abdominal aortic aneurysm) without rupture - Continue BP control     HLD (hyperlipidemia) -Continue Crestor      Tobacco abuse disorder -Continue nicotine  patch     CKD stage G3a/A1, GFR 45-59 and albumin  creatinine ratio <30 mg/g (HCC) - Cr at baseline -Lasix  DC'd       QT prolongation - Replete electrolytes      Medical Consultants:    Cardiology Pulmonology  Discharge Exam:   Vitals:   07/25/24 1005 07/25/24 1100  BP:    Pulse:    Resp: (!) 21 18  Temp:    SpO2:     Vitals:   07/25/24 0900 07/25/24 0955 07/25/24 1005 07/25/24 1100  BP:      Pulse:      Resp: 17 20 (!) 21 18  Temp:      TempSrc:      SpO2:      Weight:      Height:        General exam: Appears calm and comfortable  The results of significant diagnostics from this hospitalization (including imaging, microbiology, ancillary and laboratory) are listed below for reference.     Procedures and Diagnostic Studies:   CT Angio Chest Pulmonary Embolism (PE) W or WO Contrast Result Date: 07/17/2024 EXAM: CTA CHEST 07/17/2024 12:32:13 PM TECHNIQUE: CTA of the chest was performed without and with the administration of 60 mL of iohexol  (OMNIPAQUE ) 350 MG/ML injection. Multiplanar reformatted images are provided for review.  MIP images are provided for review. Automated exposure control, iterative reconstruction, and/or weight based adjustment of the mA/kV was utilized to reduce the radiation dose to as low as reasonably achievable. COMPARISON: 11/27/2022 and 06/01/2024. CLINICAL HISTORY: Pulmonary embolism (PE) suspected, high prob. FINDINGS: PULMONARY ARTERIES: Pulmonary arteries are adequately opacified for evaluation. No acute pulmonary embolus. Main pulmonary artery measures 3.7 cm in diameter. MEDIASTINUM: Status post mitral valve replacement. Aortic atherosclerotic calcification. There is no acute abnormality of the thoracic aorta. LYMPH NODES: No mediastinal, hilar or axillary lymphadenopathy. LUNGS AND PLEURA: Emphysema. Stable pleural plaque in the right lung base containing calcifications measuring 1.6 cm, image 105/12. Small bilateral pleural effusions. No pneumothorax. UPPER ABDOMEN: Signs of previous stent graft repair of the abdominal aorta. Right lobe of liver cyst. SOFT TISSUES AND BONES: No acute bone or soft tissue abnormality. IMPRESSION: 1. No evidence of pulmonary embolism. 2. Small bilateral pleural effusions. 3. Increased caliber of the main pulmonary artery consistent  with PA hypertension. Emphysema. 4. Aortic atherosclerotic calcification. Electronically signed by: Waddell Calk MD 07/17/2024 01:04 PM EDT RP Workstation: [id number]   ECHOCARDIOGRAM COMPLETE Result Date: 07/17/2024    ECHOCARDIOGRAM REPORT   Patient Name:   MERAL GEISSINGER Date of Exam: 07/17/2024 Medical Rec #:  [id number]       Height:       61.0 in Accession #:    [id number]      Weight:       140.0 lb Date of Birth:  1946/06/13       BSA:          1.623 m Patient Age:    78 years        BP:           164/103 mmHg Patient Gender: F               HR:           101 bpm. Exam Location:  Inpatient Procedure: 2D Echo, Cardiac Doppler and Color Doppler (Both Spectral and Color            Flow Doppler were utilized during procedure). Indications:     I50.31 Acute diastolic (congestive) heart failure  History:        Patient has prior history of Echocardiogram examinations, most                 recent 01/11/2022. COPD, Arrythmias:Atrial Fibrillation; Risk                 Factors:Hypertension and Dyslipidemia.                  Mitral Valve: 30 mm Mitral Memo prosthetic annuloplasty ring                 valve is present in the mitral position. Procedure Date: 04/20/19.  Sonographer:    Damien Senior RDCS Referring Phys: [id number] RIPUDEEP K RAI  Sonographer Comments: Scanned upright due to dyspnea, just taken off bipap IMPRESSIONS  1. Elevated LVOT velocity of 4.3 m/s increasing to 4.84 m/s with valsalva.  2. Left ventricular ejection fraction, by estimation, is >75%. The left ventricle has hyperdynamic function. The left ventricle has no regional wall motion abnormalities. There is mild left ventricular hypertrophy. Left ventricular diastolic parameters are indeterminate.  3. Right ventricular systolic function is normal. The right ventricular size is normal. There is normal pulmonary artery systolic pressure.  4. Left atrial size was mildly dilated.  5. The mitral valve has been repaired/replaced. Mild mitral valve regurgitation. Severe mitral stenosis. There is a 30 mm Mitral Memo prosthetic annuloplasty ring present in the mitral position. Procedure Date: 04/20/19.  6. The aortic valve has an indeterminant number of cusps. Aortic valve regurgitation is not visualized. No aortic stenosis is present.  7. The inferior vena cava is dilated in size with >50% respiratory variability, suggesting right atrial pressure of 8 mmHg. FINDINGS  Left Ventricle: Left ventricular ejection fraction, by estimation, is >75%. The left ventricle has hyperdynamic function. The left ventricle has no regional wall motion abnormalities. The left ventricular internal cavity size was normal in size. There is mild left ventricular hypertrophy. Left ventricular diastolic parameters are indeterminate.  Right Ventricle: The right ventricular size is normal. Right ventricular systolic function is normal. There is normal pulmonary artery systolic pressure. The tricuspid regurgitant velocity is 2.54 m/s, and with an assumed right atrial pressure of 8 mmHg,  the estimated right ventricular systolic pressure is 33.8 mmHg.  Left Atrium: Left atrial size was mildly dilated. Right Atrium: Right atrial size was normal in size. Pericardium: There is no evidence of pericardial effusion. Mitral Valve: The mitral valve has been repaired/replaced. Mild mitral annular calcification. Mild mitral valve regurgitation. There is a 30 mm Mitral Memo prosthetic annuloplasty ring present in the mitral position. Procedure Date: 04/20/19. Severe mitral  valve stenosis. MV peak gradient, 25.0 mmHg. The mean mitral valve gradient is 13.0 mmHg with average heart rate of 95 bpm. Tricuspid Valve: The tricuspid valve is normal in structure. Tricuspid valve regurgitation is trivial. No evidence of tricuspid stenosis. Aortic Valve: The aortic valve has an indeterminant number of cusps. Aortic valve regurgitation is not visualized. No aortic stenosis is present. Aortic valve mean gradient measures 9.0 mmHg. Aortic valve peak gradient measures 16.2 mmHg. Aortic valve area, by VTI measures 3.67 cm. Pulmonic Valve: The pulmonic valve was normal in structure. Pulmonic valve regurgitation is not visualized. No evidence of pulmonic stenosis. Aorta: The aortic root is normal in size and structure. Venous: The inferior vena cava is dilated in size with greater than 50% respiratory variability, suggesting right atrial pressure of 8 mmHg. IAS/Shunts: No atrial level shunt detected by color flow Doppler. Additional Comments: Elevated LVOT velocity of 4.3 m/s increasing to 4.84 m/s with valsalva.  LEFT VENTRICLE PLAX 2D LVIDd:         3.10 cm LVIDs:         2.20 cm LV PW:         1.30 cm LV IVS:        1.30 cm LVOT diam:     2.00 cm LV SV:         123 LV SV  Index:   76 LVOT Area:     3.14 cm LV IVRT:       106 msec  RIGHT VENTRICLE RV S prime:     12.00 cm/s TAPSE (M-mode): 1.9 cm LEFT ATRIUM             Index        RIGHT ATRIUM           Index LA diam:        3.20 cm 1.97 cm/m   RA Area:     13.00 cm LA Vol (A2C):   63.2 ml 38.94 ml/m  RA Volume:   28.70 ml  17.68 ml/m LA Vol (A4C):   51.8 ml 31.91 ml/m LA Biplane Vol: 59.5 ml 36.66 ml/m  AORTIC VALVE AV Area (Vmax):    3.27 cm AV Area (Vmean):   3.34 cm AV Area (VTI):     3.67 cm AV Vmax:           201.00 cm/s AV Vmean:          140.000 cm/s AV VTI:            0.335 m AV Peak Grad:      16.2 mmHg AV Mean Grad:      9.0 mmHg LVOT Vmax:         209.00 cm/s LVOT Vmean:        149.000 cm/s LVOT VTI:          0.391 m LVOT/AV VTI ratio: 1.17  AORTA Ao Root diam: 2.90 cm Ao Asc diam:  3.30 cm MITRAL VALVE              TRICUSPID VALVE MV Area VTI:  2.63 cm    TR Peak grad:   25.8  mmHg MV Peak grad: 25.0 mmHg   TR Vmax:        254.00 cm/s MV Mean grad: 13.0 mmHg MV Vmax:      2.50 m/s    SHUNTS MV Vmean:     176.0 cm/s  Systemic VTI:  0.39 m                           Systemic Diam: 2.00 cm Redell Shallow MD Electronically signed by Redell Shallow MD Signature Date/Time: 07/17/2024/12:09:01 PM    Final    DG Chest Port 1 View Result Date: 07/16/2024 EXAM: 1 VIEW(S) XRAY OF THE CHEST 07/16/2024 08:08:00 PM COMPARISON: 06/01/2024 CLINICAL HISTORY: Questionable sepsis - evaluate for abnormality. Questionable sepsis - evaluate for abnormality; SOB FINDINGS: LUNGS AND PLEURA: Hazy opacity in the right mid and lower lung. Mild chronic interstitial lung markings. No pulmonary edema. No pleural effusion. No pneumothorax. HEART AND MEDIASTINUM: Atherosclerotic plaque noted. No acute abnormality of the cardiac and mediastinal silhouettes. BONES AND SOFT TISSUES: No acute osseous abnormality. IMPRESSION: 1. Hazy opacities in the right mid and lower lung suspicious for pneumonia. Follow up in 8-10 weeks is recommended to  ensure resolution. Electronically signed by: Norman Gatlin MD 07/16/2024 08:14 PM EDT RP Workstation: [id number]     Labs:   Basic Metabolic Panel: Recent Labs  Lab 07/19/24 0529 07/20/24 0503 07/20/24 1746 07/20/24 1750 07/20/24 1751 07/22/24 0257  NA 140 140 139 137 136 139  K 4.7 4.2 3.7 3.9 4.0 4.3  CL 101 98  --   --   --  101  CO2 26 27  --   --   --  27  GLUCOSE 105* 112*  --   --   --  131*  BUN 30* 42*  --   --   --  43*  CREATININE 1.31* 1.47*  --   --   --  1.32*  CALCIUM  10.4* 10.1  --   --   --  9.3  PHOS 3.5 4.1  --   --   --   --    GFR Estimated Creatinine Clearance: 30.4 mL/min (A) (by C-G formula based on SCr of 1.32 mg/dL (H)). Liver Function Tests: Recent Labs  Lab 07/19/24 0529 07/20/24 0503  ALBUMIN  3.7 3.5   No results for input(s): LIPASE, AMYLASE in the last 168 hours. No results for input(s): AMMONIA in the last 168 hours. Coagulation profile No results for input(s): INR, PROTIME in the last 168 hours.  CBC: Recent Labs  Lab 07/19/24 0529 07/20/24 0503 07/20/24 1746 07/20/24 1750 07/20/24 1751 07/22/24 0257  WBC 17.4* 11.9*  --   --   --  6.7  HGB 11.9* 12.4 11.9* 12.2 12.2 10.7*  HCT 37.5 37.3 35.0* 36.0 36.0 33.9*  MCV 93.3 89.9  --   --   --  93.4  PLT 302 274  --   --   --  251   Cardiac Enzymes: No results for input(s): CKTOTAL, CKMB, CKMBINDEX, TROPONINI in the last 168 hours. BNP: Invalid input(s): POCBNP CBG: No results for input(s): GLUCAP in the last 168 hours. D-Dimer No results for input(s): DDIMER in the last 72 hours. Hgb A1c No results for input(s): HGBA1C in the last 72 hours. Lipid Profile No results for input(s): CHOL, HDL, LDLCALC, TRIG, CHOLHDL, LDLDIRECT in the last 72 hours. Thyroid  function studies No results for input(s): TSH, T4TOTAL, T3FREE, THYROIDAB in the last 72 hours.  Invalid input(s): FREET3 Anemia work up No results for input(s):  VITAMINB12, FOLATE, FERRITIN, TIBC, IRON, RETICCTPCT in the last 72 hours. Microbiology Recent Results (from the past 240 hours)  Resp panel by RT-PCR (RSV, Flu A&B, Covid) Anterior Nasal Swab     Status: None   Collection Time: 07/16/24  7:40 PM   Specimen: Anterior Nasal Swab  Result Value Ref Range Status   SARS Coronavirus 2 by RT PCR NEGATIVE NEGATIVE Final    Comment: (NOTE) SARS-CoV-2 target nucleic acids are NOT DETECTED.  The SARS-CoV-2 RNA is generally detectable in upper respiratory specimens during the acute phase of infection. The lowest concentration of SARS-CoV-2 viral copies this assay can detect is 138 copies/mL. A negative result does not preclude SARS-Cov-2 infection and should not be used as the sole basis for treatment or other patient management decisions. A negative result may occur with  improper specimen collection/handling, submission of specimen other than nasopharyngeal swab, presence of viral mutation(s) within the areas targeted by this assay, and inadequate number of viral copies(<138 copies/mL). A negative result must be combined with clinical observations, patient history, and epidemiological information. The expected result is Negative.  Fact Sheet for Patients:  BloggerCourse.com  Fact Sheet for Healthcare Providers:  SeriousBroker.it  This test is no t yet approved or cleared by the [place]  FDA and  has been authorized for detection and/or diagnosis of SARS-CoV-2 by FDA under an Emergency Use Authorization (EUA). This EUA will remain  in effect (meaning this test can be used) for the duration of the COVID-19 declaration under Section 564(b)(1) of the Act, 21 U.S.C.section 360bbb-3(b)(1), unless the authorization is terminated  or revoked sooner.       Influenza A by PCR NEGATIVE NEGATIVE Final   Influenza B by PCR NEGATIVE NEGATIVE Final    Comment: (NOTE) The Xpert Xpress  SARS-CoV-2/FLU/RSV plus assay is intended as an aid in the diagnosis of influenza from Nasopharyngeal swab specimens and should not be used as a sole basis for treatment. Nasal washings and aspirates are unacceptable for Xpert Xpress SARS-CoV-2/FLU/RSV testing.  Fact Sheet for Patients: BloggerCourse.com  Fact Sheet for Healthcare Providers: SeriousBroker.it  This test is not yet approved or cleared by the [place]  FDA and has been authorized for detection and/or diagnosis of SARS-CoV-2 by FDA under an Emergency Use Authorization (EUA). This EUA will remain in effect (meaning this test can be used) for the duration of the COVID-19 declaration under Section 564(b)(1) of the Act, 21 U.S.C. section 360bbb-3(b)(1), unless the authorization is terminated or revoked.     Resp Syncytial Virus by PCR NEGATIVE NEGATIVE Final    Comment: (NOTE) Fact Sheet for Patients: BloggerCourse.com  Fact Sheet for Healthcare Providers: SeriousBroker.it  This test is not yet approved or cleared by the [place]  FDA and has been authorized for detection and/or diagnosis of SARS-CoV-2 by FDA under an Emergency Use Authorization (EUA). This EUA will remain in effect (meaning this test can be used) for the duration of the COVID-19 declaration under Section 564(b)(1) of the Act, 21 U.S.C. section 360bbb-3(b)(1), unless the authorization is terminated or revoked.  Performed at [facility], [address], [place], [place] [address]   Respiratory (~20 pathogens) panel by PCR     Status: Abnormal   Collection Time: 07/16/24  7:40 PM   Specimen: Nasopharyngeal Swab; Respiratory  Result Value Ref Range Status   Adenovirus NOT DETECTED NOT DETECTED Final   Coronavirus 229E NOT DETECTED NOT DETECTED Final  Comment: (NOTE) The Coronavirus on the Respiratory Panel, DOES NOT test  for the novel  Coronavirus (2019 nCoV)    Coronavirus HKU1 NOT DETECTED NOT DETECTED Final   Coronavirus NL63 NOT DETECTED NOT DETECTED Final   Coronavirus OC43 NOT DETECTED NOT DETECTED Final   Metapneumovirus NOT DETECTED NOT DETECTED Final   Rhinovirus / Enterovirus DETECTED (A) NOT DETECTED Final   Influenza A NOT DETECTED NOT DETECTED Final   Influenza B NOT DETECTED NOT DETECTED Final   Parainfluenza Virus 1 NOT DETECTED NOT DETECTED Final   Parainfluenza Virus 2 NOT DETECTED NOT DETECTED Final   Parainfluenza Virus 3 NOT DETECTED NOT DETECTED Final   Parainfluenza Virus 4 NOT DETECTED NOT DETECTED Final   Respiratory Syncytial Virus NOT DETECTED NOT DETECTED Final   Bordetella pertussis NOT DETECTED NOT DETECTED Final   Bordetella Parapertussis NOT DETECTED NOT DETECTED Final   Chlamydophila pneumoniae NOT DETECTED NOT DETECTED Final   Mycoplasma pneumoniae NOT DETECTED NOT DETECTED Final    Comment: Performed at [facility], [address] [place], [place] [address]  Blood Culture (routine x 2)     Status: None   Collection Time: 07/16/24  7:55 PM   Specimen: BLOOD LEFT HAND  Result Value Ref Range Status   Specimen Description   Final    BLOOD LEFT HAND Performed at [facility], [address], [place], [place] [address]    Special Requests   Final    BOTTLES DRAWN AEROBIC AND ANAEROBIC Blood Culture adequate volume Performed at [facility], [address], [place], [place] [address]    Culture   Final    NO GROWTH 5 DAYS Performed at [facility], [address], [place], [place] [address]    Report Status 07/22/2024 FINAL  Final  Blood Culture (routine x 2)     Status: None   Collection Time: 07/16/24  8:08 PM   Specimen: BLOOD RIGHT ARM  Result Value Ref Range Status   Specimen Description   Final    BLOOD RIGHT ARM Performed at [facility], [address], [place], [place] [address]    Special Requests   Final     BOTTLES DRAWN AEROBIC AND ANAEROBIC Blood Culture adequate volume Performed at [facility], [address], [place], [place] [address]    Culture   Final    NO GROWTH 5 DAYS Performed at [facility], [address], [place], [place] [address]    Report Status 07/22/2024 FINAL  Final  Expectorated Sputum Assessment w Gram Stain, Rflx to Resp Cult     Status: None (Preliminary result)   Collection Time: 07/21/24 11:30 AM   Specimen: Sputum  Result Value Ref Range Status   Specimen Description SPUTUM  Final   Special Requests Normal  Final   Sputum evaluation   Final    THIS SPECIMEN IS ACCEPTABLE FOR SPUTUM CULTURE Performed at [facility], [address], [place], [place] [address]    Report Status PENDING  Incomplete  Culture, Respiratory w Gram Stain     Status: None   Collection Time: 07/21/24 11:30 AM   Specimen: SPU  Result Value Ref Range Status   Specimen Description   Final    SPUTUM Performed at [facility], [address] [place], [place] [address]    Special Requests   Final    Normal Reflexed from [phone] Performed at [facility], [address], [place], [place] [address]    Gram Stain   Final    RARE WBC PRESENT, PREDOMINANTLY PMN FEW GRAM POSITIVE COCCI FEW GRAM NEGATIVE RODS    Culture   Final    Normal respiratory flora-no Staph aureus or Pseudomonas seen Performed at [facility], [address], [place], [place] [address]    Report Status 07/24/2024 FINAL  Final     Discharge Instructions:   Discharge Instructions     Ambulatory referral to Endocrinology   Complete by: As directed    hyperthyroidism   Diet - low sodium heart healthy   Complete by: As directed    Increase activity slowly   Complete by: As directed       Allergies as of 07/25/2024   No Known Allergies      Medication List     STOP taking these medications    famotidine  20  MG tablet Commonly known as: PEPCID    metoprolol  tartrate 50 MG tablet Commonly known as: LOPRESSOR    SLEEP-AID PO   sucralfate  1 g tablet Commonly known as: Carafate        TAKE these medications    albuterol  108 (90 Base) MCG/ACT inhaler Commonly known as: VENTOLIN  HFA Inhale 2 puffs into the lungs every 6 (six) hours as needed for wheezing or shortness of breath.   apixaban 5 MG Tabs tablet Commonly known as: ELIQUIS Take 1 tablet (5 mg total) by mouth 2 (two) times daily.   diltiazem 120 MG 24 hr capsule Commonly known as: CARDIZEM CD Take 1 capsule (120 mg total) by mouth daily. Start taking on: July 26, 2024   guaiFENesin  600 MG 12 hr tablet Commonly known as: MUCINEX  Take 2 tablets (1,200 mg total) by mouth 2 (two) times daily.   levalbuterol  0.63 MG/3ML nebulizer solution Commonly known as: XOPENEX  Take 3 mLs (0.63 mg total) by nebulization every 2 (two) hours as needed for wheezing or shortness of breath.   loratadine 10 MG tablet Commonly known as: CLARITIN Take 1 tablet (10 mg total) by mouth daily. Start taking on: July 26, 2024   losartan  25 MG tablet Commonly known as: COZAAR  Take 1 tablet (25 mg total) by mouth daily. Start taking on: July 26, 2024 What changed:  medication strength how much to take   methimazole 5 MG tablet Commonly known as: TAPAZOLE Take 1 tablet (5 mg total) by mouth 2 (two) times daily.   nicotine  14 mg/24hr patch Commonly known as: NICODERM CQ  - dosed in mg/24 hours Place 1 patch (14 mg total) onto the skin daily. Start taking on: July 26, 2024   pantoprazole  40 MG tablet Commonly known as: PROTONIX  Take 1 tablet (40 mg total) by mouth daily.   predniSONE  10 MG tablet Commonly known as: DELTASONE  50 mg x 3 days then  40 mg x 3 days then 30 mg x 3 days then 20mg  x 3 days then 10mg  x 3 days then stop Start taking on: July 26, 2024   rosuvastatin  40 MG tablet Commonly known as: CRESTOR  Take 40 mg by  mouth daily.   Trelegy Ellipta  200-62.5-25 MCG/ACT Aepb Generic drug: Fluticasone -Umeclidin-Vilant Inhale 1 puff into the lungs daily.               Durable Medical Equipment  (From admission, onward)           Start     Ordered   07/19/24 1100  For home use only  DME 4 wheeled rolling walker with seat  Once       Question:  Patient needs a walker to treat with the following condition  Answer:  Unsteady gait   07/19/24 1059   07/19/24 1100  For home use only DME 3 n 1  Once        07/19/24 1059            Follow-up Information     [facility] Follow up.   Specialty: Home Health Services Why: HHPT/OT Contact information: [address] [place] [address] [phone]         [facility] Follow up.   Why: bedside comode, rollator Contact information: [address] [place] [address] [phone]        Dunn, Dayna N, PA-C Follow up.   Specialties: Cardiology, Radiology Why: [facility] [address] location - cardiology follow-up appointment on Wednesday Aug 03, 2024 at 2:20 PM (Arrive by 2:00 PM). Contact information: [address] [place] [address] [phone]                  Time coordinating discharge: 55 min  Signed:  Harlene RAYMOND Bowl DO  [facility] 07/25/2024, 12:06 PM

## 2024-07-25 NOTE — Progress Notes (Addendum)
 Patient will purchase nicotine  patches OTC- waiting on a nebulizer med from [facility] for DME (rollator & 3n1)to be delivered to room - Patient states her sister will be her ride home. Patient also confirmed she has a neb machine at home

## 2024-07-25 NOTE — Progress Notes (Signed)
 Update on DME - will be delivered to home due to delivery truck breaking down- patient received call and updated this RN, patient to lobby via w/c

## 2024-07-25 NOTE — Progress Notes (Signed)
 Discharge meds in a secure bag delivered to patient in room by this RN. Pt waiting on DME delivery from [facility]

## 2024-07-28 ENCOUNTER — Ambulatory Visit (INDEPENDENT_AMBULATORY_CARE_PROVIDER_SITE_OTHER): Payer: Medicare (Managed Care) | Admitting: Pulmonary Disease

## 2024-07-28 ENCOUNTER — Encounter: Payer: Self-pay | Admitting: Pulmonary Disease

## 2024-07-28 VITALS — BP 153/98 | HR 100 | Temp 98.5°F | Ht 61.0 in | Wt 138.8 lb

## 2024-07-28 DIAGNOSIS — J441 Chronic obstructive pulmonary disease with (acute) exacerbation: Secondary | ICD-10-CM | POA: Diagnosis not present

## 2024-07-28 DIAGNOSIS — I509 Heart failure, unspecified: Secondary | ICD-10-CM

## 2024-07-28 DIAGNOSIS — I272 Pulmonary hypertension, unspecified: Secondary | ICD-10-CM

## 2024-07-28 MED ORDER — FUROSEMIDE 40 MG PO TABS: 40.0000 mg | ORAL_TABLET | Freq: Every day | ORAL | 0 refills | Status: DC

## 2024-07-28 MED ORDER — FUROSEMIDE 40 MG PO TABS: 40.0000 mg | ORAL_TABLET | Freq: Every day | ORAL | 0 refills | Status: AC | PRN

## 2024-07-28 NOTE — Progress Notes (Signed)
 @Patient  ID: Katie [name], female    DOB: 05/27/1946, 78 y.o.   MRN: [id number]  Chief Complaint  Patient presents with   [facility] follow up     Referring provider: [name], MD  HPI:   78 y.o. with a history of COPD confirmed on spirometry in 2020 history of pulmonary hypertension confirmed on right heart cath in 2020 here for hospital follow-up.  Multiple hospital notes reviewed.  Patient long overdue for follow-up.  Last seen 02/2023.  Unfortunately she contracted illness 06/2024.  Worsening shortness of breath cough.  Found to be rhinovirus positive, likely COPD exacerbation due to viral illness.  CTA PE protocol on my review and interpretation reveals no PE, clear lungs, small bilateral pleural effusions.  She was diuresed with Lasix  concern for CHF exacerbation.  Notably multiple TTE's reviewed and serially demonstrate risk factors for CHF including valvular dysfunction diastolic dysfunction etc.  Gradually she was improved.  She needed oxygen  in the hospital.  This was weaned to room air.  She was deemed not to need oxygen  with no desaturation below 90% prior to discharge.  She continues on prednisone  taper.  She reports good adherence to Trelegy.  Her PCP must be filling this have not refilled this in a year and a half.  It does help.  She is using her rescue medications twice a day.  I encouraged her to use it more frequently as she recovers from her recent illness with ongoing dyspnea.  HPI and initial visit: Complains of cough congestion and wheezing.  Present for the last couple of weeks.  On review though she states she is gotten a course of steroids and antibiotics twice over the last couple of months.  Most recently about a month ago.  Feels short of breath some chest tightness.  Productive cough.  Worse than baseline.  Previously was on Trelegy.  Seem to control symptoms well.  Was off this for some period of time.  Recently represcribed but she has not picked  it up or started using yet.  She states no chest imaging with her recent symptoms.  Reviewed prior chest imaging including CT chest which shows emphysema, chest x-ray 11/2022 demonstrates hyperinflation on my review interpretation.    Questionaires / Pulmonary Flowsheets:   ACT:      No data to display          MMRC:     No data to display          Epworth:      No data to display          Tests:   FENO:  No results found for: NITRICOXIDE  PFT:    Latest Ref Rng & Units 03/29/2019    2:40 PM 04/20/2015    1:01 PM  PFT Results  FVC-Pre L 1.24  2.36   FVC-Predicted Pre % 47  86   FVC-Post L 1.51  2.48   FVC-Predicted Post % 57  90   Pre FEV1/FVC % % 52  74   Post FEV1/FCV % % 56  73   FEV1-Pre L 0.65  1.75   FEV1-Predicted Pre % 33  84   FEV1-Post L 0.84  1.81   DLCO uncorrected ml/min/mmHg 0.03  19.62   DLCO UNC% % 0  97   DLVA Predicted % 1  100   TLC L 6.26    TLC % Predicted % 135    RV % Predicted %  231    Personally reviewed and interpreted as spirometry suggestive of severe restriction versus air trapping, no bronchodilator response, severe obstruction present, lung volumes confirm hyperinflation and air trapping, DLCO unable to be interpreted  WALK:     04/20/2015    2:29 PM  SIX MIN WALK  Supplimental Oxygen  during Test? (L/min) No  Tech Comments: Pt able to complete 3 laps at a brisk and steady pace. No c/o SOB during or after exam     Imaging: CARDIAC CATHETERIZATION Result Date: 07/20/2024   Ost RCA to Prox RCA lesion is 30% stenosed.   Mid RCA lesion is 50% stenosed.   Mid Cx lesion is 20% stenosed.   Prox LAD lesion is 40% stenosed.   Mid LAD-1 lesion is 40% stenosed.   Mid LAD-2 lesion is 50% stenosed. Findings: Ao =125/104 (115) LV = 137/10 RA = 7 RV = 30/7 PA = 24/15 (17) PCW = 10 Fick cardiac output/index = 4.7/3.0 PVR = 1.9 WU Ao sat = 99% PA sat = 72%, 74% PAPi = 1.3 MV data = mean gradient 5.7 mmHG MVA 3.2 cm2 (very mild MS)  Assessment: 1. Mild CAD with co-dominant system and separate ostia for LAD and LCX 2. Normal filling pressures with very mild MS Plan/Discussion: Suspect main symptoms due to COPD. Continue medical therapy. Toribio Fuel, MD 6:34 PM  US  EKG SITE RITE Result Date: 07/20/2024 If Site Rite image not attached, placement could not be confirmed due to current cardiac rhythm.  DG ABD ACUTE 2+V W 1V CHEST Result Date: 07/19/2024 CLINICAL DATA:  Chronic diastolic heart failure. EXAM: DG ABDOMEN ACUTE WITH 1 VIEW CHEST COMPARISON:  July 16, 2024. FINDINGS: Mildly dilated small bowel loops are noted in the upper abdomen suggesting ileus or possible obstruction. No definite colonic dilatation is noted. Moderate stool seen in right colon. No radiopaque calculi. Status post cholecystectomy and aortic stent graft placement. Phleboliths are noted in the pelvis. Heart size and mediastinal contours are within normal limits. Right infrahilar atelectasis or infiltrate is noted. IMPRESSION: 1. Mildly dilated small bowel loops are noted in the upper abdomen suggesting ileus or possible obstruction. 2. Right infrahilar atelectasis or infiltrate is noted. Electronically Signed   By: Lynwood Landy Raddle M.D.   On: 07/19/2024 12:19   US  THYROID  Result Date: 07/18/2024 EXAM: US  THYROID  07/18/2024 03:15:25 PM TECHNIQUE: Real-time ultrasound scan of the thyroid  gland and soft tissues of the neck with image documentation. COMPARISON: None available. CLINICAL HISTORY: 01547 Hyperthyroidism 98452. Hyperthyroidism FINDINGS: Right thyroid  lobe: 4.1 x 1.4 x 1.8 cm Left thyroid  lobe: 3.4 x 1.3 x 1.3 cm Isthmus: 0.2 cm in thickness Echotexture: Parenchyma echotexture moderately heterogeneous. Vascularity: Normal Vascularity. Thyroid  Nodules: Nodule 1: Mid right, 1.2 x 0.8 x 1 cm, Composition: mostly solid (2 pts), Echogenicity: isoechoic (1 pt), Shape: Wider than tall (0 pts), Margin: Smooth (0 pts), Echogenic Foci: None (0 pts), TI-RADS  category: TR3. Nodule 2: Inferior right, 1.3 x 0.3 x 0.6 cm, Composition: complex cyst (Mixed cystic and solid) (1 pt), Echogenicity: Isoechoic (1 pt), Shape: Wider than tall (0 pts), Margin: Smooth (0 pts), Echogenic Foci: None (0 pts), TI-RADS category: TR2. Nodule 3: Superior left, 0.6 x 0.6 x 0.5 cm, Composition: complex cyst (Mixed cystic and solid) (1 pt), Echogenicity: Isoechoic (1 pt), Shape: Wider than tall (0 pts), Margin: Smooth (0 pts), Echogenic Foci: None (0 pts), TI-RADS category: TR2. ACR TI-RADS recommendations: TR5 (7 or more points) - FNA if greater than or equal to  1 cm; if 0.5 - 0.9 cm follow-up every year for 5 years. TR4 (4-6 points) - FNA if greater than or equal to 1.5 cm; if 1 - 1.4 cm follow-up in 1, 2, 3 and 5 years. TR3 (3 points) - FNA if greater than or equal to 2.5 cm; if 1.5 - 2.4 cm follow-up in 1, 3 and 5 years. TR2 (2 points) and TR1 (0 points) - No FNA or follow-up. ACR TI-RADS recommends that no more than two nodules with the highest ACR TI-RADS total point should be biopsied and no more than four nodules should be followed. Soft tissues: No regional cervical adenopathy identified. IMPRESSION: 1. Moderately heterogeneous thyroid  parenchyma. 2. Multiple nodules. None meet criteria for biopsy or follow-up. Electronically signed by: Katheleen Faes MD 07/18/2024 03:36 PM EDT RP Workstation: [id number]   CT Angio Chest Pulmonary Embolism (PE) W or WO Contrast Result Date: 07/17/2024 EXAM: CTA CHEST 07/17/2024 12:32:13 PM TECHNIQUE: CTA of the chest was performed without and with the administration of 60 mL of iohexol  (OMNIPAQUE ) 350 MG/ML injection. Multiplanar reformatted images are provided for review. MIP images are provided for review. Automated exposure control, iterative reconstruction, and/or weight based adjustment of the mA/kV was utilized to reduce the radiation dose to as low as reasonably achievable. COMPARISON: 11/27/2022 and 06/01/2024. CLINICAL HISTORY: Pulmonary  embolism (PE) suspected, high prob. FINDINGS: PULMONARY ARTERIES: Pulmonary arteries are adequately opacified for evaluation. No acute pulmonary embolus. Main pulmonary artery measures 3.7 cm in diameter. MEDIASTINUM: Status post mitral valve replacement. Aortic atherosclerotic calcification. There is no acute abnormality of the thoracic aorta. LYMPH NODES: No mediastinal, hilar or axillary lymphadenopathy. LUNGS AND PLEURA: Emphysema. Stable pleural plaque in the right lung base containing calcifications measuring 1.6 cm, image 105/12. Small bilateral pleural effusions. No pneumothorax. UPPER ABDOMEN: Signs of previous stent graft repair of the abdominal aorta. Right lobe of liver cyst. SOFT TISSUES AND BONES: No acute bone or soft tissue abnormality. IMPRESSION: 1. No evidence of pulmonary embolism. 2. Small bilateral pleural effusions. 3. Increased caliber of the main pulmonary artery consistent with PA hypertension. Emphysema. 4. Aortic atherosclerotic calcification. Electronically signed by: Waddell Calk MD 07/17/2024 01:04 PM EDT RP Workstation: [id number]   ECHOCARDIOGRAM COMPLETE Result Date: 07/17/2024    ECHOCARDIOGRAM REPORT   Patient Name:   Katie [name] Date of Exam: 07/17/2024 Medical Rec #:  [id number]       Height:       61.0 in Accession #:    [id number]      Weight:       140.0 lb Date of Birth:  [birth year]       BSA:          1.623 m Patient Age:    78 years        BP:           164/103 mmHg Patient Gender: F               HR:           101 bpm. Exam Location:  Inpatient Procedure: 2D Echo, Cardiac Doppler and Color Doppler (Both Spectral and Color            Flow Doppler were utilized during procedure). Indications:    I50.31 Acute diastolic (congestive) heart failure  History:        Patient has prior history of Echocardiogram examinations, most                 recent 01/11/2022. COPD, Arrythmias:Atrial  Fibrillation; Risk                 Factors:Hypertension and Dyslipidemia.                   Mitral Valve: 30 mm Mitral Memo prosthetic annuloplasty ring                 valve is present in the mitral position. Procedure Date: 04/20/19.  Sonographer:    Damien Senior RDCS Referring Phys: [id number] RIPUDEEP K RAI  Sonographer Comments: Scanned upright due to dyspnea, just taken off bipap IMPRESSIONS  1. Elevated LVOT velocity of 4.3 m/s increasing to 4.84 m/s with valsalva.  2. Left ventricular ejection fraction, by estimation, is >75%. The left ventricle has hyperdynamic function. The left ventricle has no regional wall motion abnormalities. There is mild left ventricular hypertrophy. Left ventricular diastolic parameters are indeterminate.  3. Right ventricular systolic function is normal. The right ventricular size is normal. There is normal pulmonary artery systolic pressure.  4. Left atrial size was mildly dilated.  5. The mitral valve has been repaired/replaced. Mild mitral valve regurgitation. Severe mitral stenosis. There is a 30 mm Mitral Memo prosthetic annuloplasty ring present in the mitral position. Procedure Date: 04/20/19.  6. The aortic valve has an indeterminant number of cusps. Aortic valve regurgitation is not visualized. No aortic stenosis is present.  7. The inferior vena cava is dilated in size with >50% respiratory variability, suggesting right atrial pressure of 8 mmHg. FINDINGS  Left Ventricle: Left ventricular ejection fraction, by estimation, is >75%. The left ventricle has hyperdynamic function. The left ventricle has no regional wall motion abnormalities. The left ventricular internal cavity size was normal in size. There is mild left ventricular hypertrophy. Left ventricular diastolic parameters are indeterminate. Right Ventricle: The right ventricular size is normal. Right ventricular systolic function is normal. There is normal pulmonary artery systolic pressure. The tricuspid regurgitant velocity is 2.54 m/s, and with an assumed right atrial pressure of 8 mmHg,  the estimated right  ventricular systolic pressure is 33.8 mmHg. Left Atrium: Left atrial size was mildly dilated. Right Atrium: Right atrial size was normal in size. Pericardium: There is no evidence of pericardial effusion. Mitral Valve: The mitral valve has been repaired/replaced. Mild mitral annular calcification. Mild mitral valve regurgitation. There is a 30 mm Mitral Memo prosthetic annuloplasty ring present in the mitral position. Procedure Date: 04/20/19. Severe mitral  valve stenosis. MV peak gradient, 25.0 mmHg. The mean mitral valve gradient is 13.0 mmHg with average heart rate of 95 bpm. Tricuspid Valve: The tricuspid valve is normal in structure. Tricuspid valve regurgitation is trivial. No evidence of tricuspid stenosis. Aortic Valve: The aortic valve has an indeterminant number of cusps. Aortic valve regurgitation is not visualized. No aortic stenosis is present. Aortic valve mean gradient measures 9.0 mmHg. Aortic valve peak gradient measures 16.2 mmHg. Aortic valve area, by VTI measures 3.67 cm. Pulmonic Valve: The pulmonic valve was normal in structure. Pulmonic valve regurgitation is not visualized. No evidence of pulmonic stenosis. Aorta: The aortic root is normal in size and structure. Venous: The inferior vena cava is dilated in size with greater than 50% respiratory variability, suggesting right atrial pressure of 8 mmHg. IAS/Shunts: No atrial level shunt detected by color flow Doppler. Additional Comments: Elevated LVOT velocity of 4.3 m/s increasing to 4.84 m/s with valsalva.  LEFT VENTRICLE PLAX 2D LVIDd:         3.10 cm LVIDs:  2.20 cm LV PW:         1.30 cm LV IVS:        1.30 cm LVOT diam:     2.00 cm LV SV:         123 LV SV Index:   76 LVOT Area:     3.14 cm LV IVRT:       106 msec  RIGHT VENTRICLE RV S prime:     12.00 cm/s TAPSE (M-mode): 1.9 cm LEFT ATRIUM             Index        RIGHT ATRIUM           Index LA diam:        3.20 cm 1.97 cm/m   RA Area:     13.00 cm LA Vol (A2C):   63.2 ml  38.94 ml/m  RA Volume:   28.70 ml  17.68 ml/m LA Vol (A4C):   51.8 ml 31.91 ml/m LA Biplane Vol: 59.5 ml 36.66 ml/m  AORTIC VALVE AV Area (Vmax):    3.27 cm AV Area (Vmean):   3.34 cm AV Area (VTI):     3.67 cm AV Vmax:           201.00 cm/s AV Vmean:          140.000 cm/s AV VTI:            0.335 m AV Peak Grad:      16.2 mmHg AV Mean Grad:      9.0 mmHg LVOT Vmax:         209.00 cm/s LVOT Vmean:        149.000 cm/s LVOT VTI:          0.391 m LVOT/AV VTI ratio: 1.17  AORTA Ao Root diam: 2.90 cm Ao Asc diam:  3.30 cm MITRAL VALVE              TRICUSPID VALVE MV Area VTI:  2.63 cm    TR Peak grad:   25.8 mmHg MV Peak grad: 25.0 mmHg   TR Vmax:        254.00 cm/s MV Mean grad: 13.0 mmHg MV Vmax:      2.50 m/s    SHUNTS MV Vmean:     176.0 cm/s  Systemic VTI:  0.39 m                           Systemic Diam: 2.00 cm Redell Shallow MD Electronically signed by Redell Shallow MD Signature Date/Time: 07/17/2024/12:09:01 PM    Final    DG Chest Port 1 View Result Date: 07/16/2024 EXAM: 1 VIEW(S) XRAY OF THE CHEST 07/16/2024 08:08:00 PM COMPARISON: 06/01/2024 CLINICAL HISTORY: Questionable sepsis - evaluate for abnormality. Questionable sepsis - evaluate for abnormality; SOB FINDINGS: LUNGS AND PLEURA: Hazy opacity in the right mid and lower lung. Mild chronic interstitial lung markings. No pulmonary edema. No pleural effusion. No pneumothorax. HEART AND MEDIASTINUM: Atherosclerotic plaque noted. No acute abnormality of the cardiac and mediastinal silhouettes. BONES AND SOFT TISSUES: No acute osseous abnormality. IMPRESSION: 1. Hazy opacities in the right mid and lower lung suspicious for pneumonia. Follow up in 8-10 weeks is recommended to ensure resolution. Electronically signed by: Norman Gatlin MD 07/16/2024 08:14 PM EDT RP Workstation: [id number]    Lab Results:  CBC    Component Value Date/Time   WBC 6.7 07/22/2024 0257   RBC 3.63 (L) 07/22/2024 9742  HGB 10.7 (L) 07/22/2024 0257   HCT 33.9 (L)  07/22/2024 0257   PLT 251 07/22/2024 0257   MCV 93.4 07/22/2024 0257   MCH 29.5 07/22/2024 0257   MCHC 31.6 07/22/2024 0257   RDW 12.5 07/22/2024 0257   LYMPHSABS 1.2 07/16/2024 1940   MONOABS 0.7 07/16/2024 1940   EOSABS 0.1 07/16/2024 1940   BASOSABS 0.0 07/16/2024 1940    BMET    Component Value Date/Time   NA 139 07/22/2024 0257   NA 140 06/07/2021 1438   K 4.3 07/22/2024 0257   CL 101 07/22/2024 0257   CO2 27 07/22/2024 0257   GLUCOSE 131 (H) 07/22/2024 0257   BUN 43 (H) 07/22/2024 0257   BUN 21 06/07/2021 1438   CREATININE 1.32 (H) 07/22/2024 0257   CREATININE 1.81 (H) 06/03/2016 1631   CALCIUM  9.3 07/22/2024 0257   GFRNONAA 42 (L) 07/22/2024 0257   GFRAA 37 (L) 04/26/2020 0246    BNP    Component Value Date/Time   BNP 301.8 (H) 11/27/2022 0824    ProBNP    Component Value Date/Time   PROBNP 7,015.0 (H) 07/17/2024 0033    Specialty Problems       Pulmonary Problems   COPD   Followed in Pulmonary clinic/ [facility]/ [facility] - PFT's 04/20/2015 wnl except dlco 52% - 04/20/2015  Walked RA x 3 laps @ 185 ft each stopped due to  End study sats ok brisk pace/ no desat  - 04/20/2015 p extensive coaching HFA effectiveness =    75%       COPD with acute exacerbation (HCC)   Smokers' cough (HCC)   CAP (community acquired pneumonia)   Acute respiratory failure with hypoxia (HCC)   COPD exacerbation (HCC)    No Known Allergies  Immunization History  Administered Date(s) Administered   Fluad Quad(high Dose 65+) 10/31/2019   INFLUENZA, HIGH DOSE SEASONAL PF 08/29/2017, 10/04/2018, 07/18/2024   Influenza Split 09/27/2011   Influenza,inj,Quad PF,6+ Mos 07/14/2013, 01/02/2015, 09/10/2016, 08/06/2017   PFIZER(Purple Top)SARS-COV-2 Vaccination 12/03/2019, 12/31/2019, 08/07/2020   Pneumococcal Conjugate-13 04/29/2017   Pneumococcal Polysaccharide-23 09/27/2011, 08/15/2013   Tdap 10/09/2011    Past Medical History:  Diagnosis Date   AAA (abdominal aortic  aneurysm)    AKI (acute kidney injury)    Arthritis    Chest pain 04/17/2016   Cholelithiasis 2011   s/p cholescystectomy    Chronic diastolic congestive heart failure (HCC)    COPD (chronic obstructive pulmonary disease) (HCC)    GERD (gastroesophageal reflux disease)    History of renal cell carcinoma    Hypercholesteremia    Hypertension    Hypertension    Mitral regurgitation    Nausea and vomiting 06/19/2022   Pneumonia 1980's   Renal cell carcinoma 01/2010   s/p right radical nephrectomy 01/2010,  followed by [facility]   S/P minimally invasive mitral valve repair 04/20/2019   Complex valvuloplasty including triangular resection of flail segment of posterior leaflet, artificial Gore-tex neochord placement x6 and 30 mm Sorin Memo 4D ring annuloplasty via right mini thoracotomy approach   Shortness of breath    Tuberculosis    Tests positive for PPD. dad had h/o TB.    Tobacco History: Social History   Tobacco Use  Smoking Status Every Day   Current packs/day: 0.00   Average packs/day: 0.5 packs/day for 53.0 years (26.5 ttl pk-yrs)   Types: Cigarettes   Start date: 03/17/1966   Last attempt to quit: 03/18/2019   Years since quitting:  5.3   Passive exposure: Current (Son who lives with her Smokes)  Smokeless Tobacco Never   Ready to quit: Not Answered Counseling given: Not Answered   Continue to not smoke  Outpatient Encounter Medications as of 07/28/2024  Medication Sig   albuterol  (VENTOLIN  HFA) 108 (90 Base) MCG/ACT inhaler Inhale 2 puffs into the lungs every 6 (six) hours as needed for wheezing or shortness of breath.   apixaban (ELIQUIS) 5 MG TABS tablet Take 1 tablet (5 mg total) by mouth 2 (two) times daily.   diltiazem (CARDIZEM CD) 120 MG 24 hr capsule Take 1 capsule (120 mg total) by mouth daily.   Fluticasone -Umeclidin-Vilant (TRELEGY ELLIPTA ) 200-62.5-25 MCG/ACT AEPB Inhale 1 puff into the lungs daily.   guaiFENesin  (MUCINEX ) 600 MG 12 hr tablet  Take 2 tablets (1,200 mg total) by mouth 2 (two) times daily.   levalbuterol  (XOPENEX ) 0.63 MG/3ML nebulizer solution Take 3 mLs (0.63 mg total) by nebulization every 2 (two) hours as needed for wheezing or shortness of breath.   loratadine (CLARITIN) 10 MG tablet Take 1 tablet (10 mg total) by mouth daily.   losartan  (COZAAR ) 25 MG tablet Take 1 tablet (25 mg total) by mouth daily.   methimazole (TAPAZOLE) 5 MG tablet Take 1 tablet (5 mg total) by mouth 2 (two) times daily.   nicotine  (NICODERM CQ  - DOSED IN MG/24 HOURS) 14 mg/24hr patch Place 1 patch (14 mg total) onto the skin daily.   pantoprazole  (PROTONIX ) 40 MG tablet Take 1 tablet (40 mg total) by mouth daily.   predniSONE  (DELTASONE ) 10 MG tablet Take 5 tablets daily for 3 days THEN take 4 tablets daily for 3 days THEN take 3 tablets daily for 3 days THEN take 2 tablets daily for 3 days THEN take 1 tablet daily for 3 days then stop.   rosuvastatin  (CRESTOR ) 40 MG tablet Take 40 mg by mouth daily.   [DISCONTINUED] furosemide  (LASIX ) 40 MG tablet Take 1 tablet (40 mg total) by mouth daily.   furosemide  (LASIX ) 40 MG tablet Take 1 tablet (40 mg total) by mouth daily as needed for edema or fluid (For weight gain > 2 pounds in 24 hours or > 5 pounds in a week).   No facility-administered encounter medications on file as of 07/28/2024.     Review of Systems  Review of Systems  No chest pain with exertion.  No orthopnea or PND.  Comprehensive review of systems otherwise negative. Physical Exam  BP (!) 153/98   Pulse 100   Temp 98.5 F (36.9 C) (Oral)   Ht 5' 1 (1.549 m)   Wt 138 lb 12.8 oz (63 kg)   SpO2 97%   BMI 26.23 kg/m   Wt Readings from Last 5 Encounters:  07/28/24 138 lb 12.8 oz (63 kg)  07/25/24 134 lb 11.2 oz (61.1 kg)  06/01/24 140 lb (63.5 kg)  05/04/24 140 lb (63.5 kg)  03/12/24 140 lb (63.5 kg)    BMI Readings from Last 5 Encounters:  07/28/24 26.23 kg/m  07/25/24 25.45 kg/m  06/01/24 26.45 kg/m   05/04/24 26.45 kg/m  03/12/24 26.45 kg/m     Physical Exam General: Thin, sitting in chair Eyes: EOMI, no icterus Neck: Supple, no JVP Abdomen: Nondistended, bowel sounds present MSK: No synovitis, no joint effusion Cardiovascular: Warm, no edema Pulmonary: Diffuse and expiratory wheezing throughout, normal work of breathing Neuro: Normal gait, no weakness Psych: Normal mood, full affect   Assessment & Plan:     COPD:  Severe in 2020 with hyperinflation and air trapping on lung volumes.  She reports good adherence to Trelegy most beginning via her PCP.  She is encouraged to continue this.  Continue steroids for recent exacerbation due to rhinovirus.  Eosinophils recently not elevated where Biologics are an option.  Encouraged to use short acting bronchodilators 4 times daily until she start to see improvement then can decrease to every 4-6 hours as needed.  Congestive heart failure: With volume overload in the hospital.  Lasix  as needed was recommended.  Does not prescribe.  I prescribed Lasix  40 mg as needed for weight gain greater than 2 pounds in 24 hours or 5 pounds in 1 week as well as for worsening lower extremity swelling.  Further management via cardiologist, has upcoming appointment next week.  Pulmonary hypertension: Diagnosed June 2020 with a mean PA pressure of 25, wedge of 20, preserved cardiac output and index with a PVR less than 1.  Likely commendation of large group 2 disease given elevated wedge and dilated left atrium.  Possible group 3 disease but she is not hypoxemic so this feels less likely.  No role for pulmonary vasodilators.  Return in about 6 months (around 01/26/2025) for f/u Dr. Annella.   Donnice JONELLE Annella, MD 07/28/2024   This appointment required 41 minutes of patient care (this includes precharting, chart review, review of results, face-to-face care, etc.).

## 2024-07-28 NOTE — Patient Instructions (Addendum)
 Take Lasix  40 mg as needed for weight gain of > 2 pounds in 24 hours or > 5 pounds in 1 week  Continue Trelegy 1 puff once a day, rinse mouth after every use  Use levalbuterol  as needed for wheeze or shortness of breath  We will do a test to see if oxygen  is dropping at night  Return to clinic in 6 months or sooner as needed

## 2024-08-02 NOTE — Progress Notes (Unsigned)
 Cardiology Office Note    Date:  08/03/2024  ID:  Katie [name], DOB 1946/07/13, MRN [id number] PCP:  [name], MD  Cardiologist:  Annabella Scarce, MD  Electrophysiologist:  None   Chief Complaint: f/u hospitalization, DOE  History of Present Illness: Katie [name]    Katie [name] is a 78 y.o. female with visit-pertinent history of MVP and severe MR s/p MV repair 2020, nonobstructive CAD, tobacco, cocaine use, chronic HFpEF, carotid artery disease (1-39% BICA), HTN, HLD, COPD, renal cell carcinoma s/p right nephrectomy 2011, CKD III, brief NSVT/PSVT, tobacco use, AAA s/p stent graft 2023, prior post-op afib, recent paroxysmal atrial flutter, small pericardial effusion seen for follow-up.   She has MV disease and underwent pre-op cath 03/2019 with minimal nonobstructive CAD (25% pLAD, 25% p-mCx). She underwent complex MV repair in 04/2019. Post-op course was notable for post-op atrial fibrillation requiring amiodarone  and Coumadin  therapy. Amiodarone  was discontinued in follow-up as she was maintaining normal rhythm. It is not clear at what point Coumadin  was stopped as she was then lost to follow-up after surgery until another office evaluation in 2022 for syncope. Monitor showed NSR, avg 73bpm, range 46-267bpm with 2 NSVT (longest 5 beats), 10 PSVT (longest 12.4 sec), rare PACs/PVCs. She was again lost to follow-up until seen for pre-op evaluation in 2023 at the request of vascular surgery for AAA stent graft in 2023. Echo showed EF 65-70%, LVOT peak gradient , mildly reduced RV function, moderate LAE, small pericardial effusion, s/p MV repair with moderate mitral stenosis. She underwent AAA surgery without acute complication. She was admitted to the hospital 02/2024 for AKI in setting of GI illness and ongoing cocaine use.   She was recently hospitalized 9/27-10/6/25 for acute respiratory failure with hypoxia felt due to viral PNA/rhinovirus with AECOPD, acute on chronic HFpEF, cocaine use,  hyperthyroidism, and iron deficiency anemia. 2d echo showed EF >75%, elevated LVOT velocity of 4.89m/s increasing to 4.84 m/s with valsalva, mild LVH, mildly dilated LA, mild MR, severe MS. She was briefly diuresed but developed mild AKI so this was discontinued. Due to persistent dyspnea, she ultimately underwent [facility] 07/20/24 with mild nonobstructive CAD, normal filling pressures, very mild MS. Symptoms therefore felt primarily pulmonary in etiology. She also developed brief atrial flutter on telemetry so was started on diltiazem and Eliquis.  She continues to have significant dyspnea with minimal activity as well as productive raspy cough which she states is about the same as when she was discharged from the hospital. She is here with her sister. She saw pulmonology on 07/28/24 who encouraged to use bronchodilators 4x daily until she saw an improvement. She has been doing her nebulizers occasionally but not 4x/day. She has been taking her prednisone  taper. Pulmonology prescribed PRN Lasix  which the patient has not yet taken. She also reports she has since seen her PCP for persistent cough and dyspnea and was found to have an elevated white blood cell count. She reports they called in antibiotics but she has not picked these up. She plans to go back there on Friday for a recheck. No chest pain, edema or orthopnea. Weights were quite variable in the hospital, initially 141, nadir 126 lb ?in context of AKI/dehydration, ?outlier 112 lb, here at 139lb. She has also noticed episodic short episodes of fluttering of her heart. She denies any current tobacco, ETOH or illicit drug use. She thinks she is otherwise taking her medicines as prescribed citing whatever is on that list.   Labwork independently reviewed:  07/2022 K 4.3, Cr 1.32, Hgb 10.7, plt 251 06/2024 AST/ALT wnl, TSH 0.284, free T4 up, T3 wnl, Mg 3.0, ESR 60, pBNP 7k  ROS: .    Please see the history of present illness.  All other systems are reviewed  and otherwise negative.  Studies Reviewed: Katie [name]    EKG:  EKG is ordered today, personally reviewed, demonstrating:  EKG Interpretation Date/Time:  Wednesday August 03 2024 13:59:01 EDT Ventricular Rate:  89 PR Interval:  132 QRS Duration:  80 QT Interval:  356 QTC Calculation: 433 R Axis:   -9  Text Interpretation: Normal sinus rhythm Notched P waves  Nonspecific ST/TW changes Confirmed by Leondro Coryell [phone]) on 08/03/2024 2:18:41 PM    CV Studies: Cardiac studies reviewed are outlined and summarized above. Otherwise please see EMR for full report.   Current Reported Medications:.    Current Meds  Medication Sig   albuterol  (VENTOLIN  HFA) 108 (90 Base) MCG/ACT inhaler Inhale 2 puffs into the lungs every 6 (six) hours as needed for wheezing or shortness of breath.   apixaban (ELIQUIS) 5 MG TABS tablet Take 1 tablet (5 mg total) by mouth 2 (two) times daily.   diltiazem (CARDIZEM CD) 120 MG 24 hr capsule Take 1 capsule (120 mg total) by mouth daily.   Fluticasone -Umeclidin-Vilant (TRELEGY ELLIPTA ) 200-62.5-25 MCG/ACT AEPB Inhale 1 puff into the lungs daily.   furosemide  (LASIX ) 40 MG tablet Take 1 tablet (40 mg total) by mouth daily as needed for edema or fluid (For weight gain > 2 pounds in 24 hours or > 5 pounds in a week).   guaiFENesin  (MUCINEX ) 600 MG 12 hr tablet Take 2 tablets (1,200 mg total) by mouth 2 (two) times daily. (Patient taking differently: Take 1,200 mg by mouth 2 (two) times daily as needed.)   levalbuterol  (XOPENEX ) 0.63 MG/3ML nebulizer solution Take 3 mLs (0.63 mg total) by nebulization every 2 (two) hours as needed for wheezing or shortness of breath.   loratadine (CLARITIN) 10 MG tablet Take 1 tablet (10 mg total) by mouth daily.   losartan  (COZAAR ) 25 MG tablet Take 1 tablet (25 mg total) by mouth daily.   methimazole (TAPAZOLE) 5 MG tablet Take 1 tablet (5 mg total) by mouth 2 (two) times daily.   nicotine  (NICODERM CQ  - DOSED IN MG/24 HOURS) 14 mg/24hr patch  Place 1 patch (14 mg total) onto the skin daily.   pantoprazole  (PROTONIX ) 40 MG tablet Take 1 tablet (40 mg total) by mouth daily.   predniSONE  (DELTASONE ) 10 MG tablet Take 5 tablets daily for 3 days THEN take 4 tablets daily for 3 days THEN take 3 tablets daily for 3 days THEN take 2 tablets daily for 3 days THEN take 1 tablet daily for 3 days then stop.   rosuvastatin  (CRESTOR ) 40 MG tablet Take 40 mg by mouth daily.    Physical Exam:    VS:  BP 118/60   Pulse 89   Ht 5' 1 (1.549 m)   Wt 139 lb (63 kg)   BMI 26.26 kg/m    Wt Readings from Last 3 Encounters:  08/03/24 139 lb (63 kg)  07/28/24 138 lb 12.8 oz (63 kg)  07/25/24 134 lb 11.2 oz (61.1 kg)    GEN: Well nourished, well developed in no acute distress NECK: No JVD; No carotid bruits CARDIAC: RRR, no murmurs, rubs, gallops RESPIRATORY:  Diffuse rhonchi, no wheezing or rales, good air movement ABDOMEN: Soft, non-tender, non-distended EXTREMITIES:  No edema; No acute  deformity. Right groin cath site without hematoma, ecchymosis, or bruit. Right arm cath sites with mild ecchymosis in mid stages of resolution with mild yellowing, no ecchymosis.  Asessement and Plan:.    1. Continued dyspnea on exertion, cough - continues to have marked dyspnea with exertion and raspy productive cough. Recent presentation in the hospital was felt primarily driven by pulmonary etiology. She also had developed a new anemia with Hgb 9-11 range down from previous 14-16 even prior to blood thinner initiation. Encouraged to use her pulmonary regimen as prescribed which includes bronchodilators and steroid taper. She also reports she saw her PCP who did a blood count a few days ago and found it to be elevated so started her on antibiotics but she has not yet picked these up yet, encouraged her to follow plan of care. She also has follow-up with them on Friday. From cardiac standpoint, does not look overtly volume overloaded, but will recheck CBC, BMET, BNP  today with labs to help guide rx for PRN dosing of Lasix . I told her if SOB worsens or does not improve, would warrant return to hospital. If labs are worrisome, may be directed back regardless.  2. Chronic HFpEF, small pericardial effusion - rechecking labs above and following clinically. RHC reviewed above.  3. Mitral valve disease s/p  MV repair with mitral stenosis - reiterated need for SBE ppx, states she has false teeth and does not plan to go to the dentist. She was encouraged to let us  know if she needs any dental care going forward. Recent cath results were more suggestive of mild MS.  4. Paroxysmal atrial flutter, remote hx of atrial fibrillation - has had some episodic palpitations since discharge. Will arrange 2 week Zio for evaluation. Also encouraged ongoing f/u PCP for hyperthyroidism as this may be contributing. Continue diltiazem 120mg  daily and Eliquis 5mg  BID. Note presence of mitral stenosis - this was felt possibly severe by echocardiogram though cardiac catheterization suggested this was mild. I will send a message to Dr. Raford to ensure she is OK with Eliquis in the setting of her valvular disease. Since the cath had shown mild MS, reasonable to continue for now. With previous compliance issues, Coumadin  is less ideal situation. Recheck CBC, BMET today to ensure stable. She was also advised of importance of cocaine cessation in the hospital.  5. Nonobstructive CAD, HLD - recent cath reviewed above, nonobstructive disease. Lipids are managed by primary care. She is not on ASA due to concomitant Eliquis. No recent chest pain otherwise.    Disposition: F/u with me or APP in 6 weeks. Also encouraged overdue f/u vascular surgery.    Signed, Laszlo Ellerby N Eitan Doubleday, PA-C

## 2024-08-03 ENCOUNTER — Ambulatory Visit: Payer: Medicare (Managed Care) | Attending: Physician Assistant | Admitting: Physician Assistant

## 2024-08-03 ENCOUNTER — Other Ambulatory Visit: Payer: Self-pay | Admitting: Physician Assistant

## 2024-08-03 ENCOUNTER — Ambulatory Visit: Payer: Medicare (Managed Care) | Attending: Physician Assistant

## 2024-08-03 ENCOUNTER — Encounter: Payer: Self-pay | Admitting: Physician Assistant

## 2024-08-03 VITALS — BP 118/60 | HR 89 | Ht 61.0 in | Wt 139.0 lb

## 2024-08-03 DIAGNOSIS — E785 Hyperlipidemia, unspecified: Secondary | ICD-10-CM

## 2024-08-03 DIAGNOSIS — I050 Rheumatic mitral stenosis: Secondary | ICD-10-CM

## 2024-08-03 DIAGNOSIS — R002 Palpitations: Secondary | ICD-10-CM | POA: Diagnosis not present

## 2024-08-03 DIAGNOSIS — I5032 Chronic diastolic (congestive) heart failure: Secondary | ICD-10-CM

## 2024-08-03 DIAGNOSIS — R0609 Other forms of dyspnea: Secondary | ICD-10-CM | POA: Diagnosis not present

## 2024-08-03 DIAGNOSIS — Z9889 Other specified postprocedural states: Secondary | ICD-10-CM

## 2024-08-03 DIAGNOSIS — I480 Paroxysmal atrial fibrillation: Secondary | ICD-10-CM

## 2024-08-03 DIAGNOSIS — I4892 Unspecified atrial flutter: Secondary | ICD-10-CM

## 2024-08-03 DIAGNOSIS — I2510 Atherosclerotic heart disease of native coronary artery without angina pectoris: Secondary | ICD-10-CM

## 2024-08-03 LAB — CBC
Hematocrit: 29 % — ABNORMAL LOW (ref 34.0–46.6)
Hemoglobin: 9.4 g/dL — ABNORMAL LOW (ref 11.1–15.9)
MCH: 29.8 pg (ref 26.6–33.0)
MCHC: 32.4 g/dL (ref 31.5–35.7)
MCV: 92 fL (ref 79–97)
Platelets: 207 x10E3/uL (ref 150–450)
RBC: 3.15 x10E6/uL — ABNORMAL LOW (ref 3.77–5.28)
RDW: 13.2 % (ref 11.7–15.4)
WBC: 13.6 x10E3/uL — ABNORMAL HIGH (ref 3.4–10.8)

## 2024-08-03 NOTE — Progress Notes (Unsigned)
Enrolled patient for a 14 day Zio XT monitor to be mailed to patients home  [place] to read

## 2024-08-03 NOTE — Patient Instructions (Addendum)
 Medication Instructions:   Your physician recommends that you continue on your current medications as directed. Please refer to the Current Medication list given to you today.  *If you need a refill on your cardiac medications before your next appointment, please call your pharmacy*  Lab Work:  Today on the 1st floor at [facility]--BMET, PRO-BNP, AND CBC   If you have labs (blood work) drawn today and your tests are completely normal, you will receive your results only by: MyChart Message (if you have MyChart) OR A paper copy in the mail If you have any lab test that is abnormal or we need to change your treatment, we will call you to review the results.   Testing/Procedures:  GEOFFRY HEWS- Long Term Monitor Instructions  Your physician has requested you wear a ZIO patch monitor for 14 days.  This is a single patch monitor. [employer] supplies one patch monitor per enrollment. Additional stickers are not available. Please do not apply patch if you will be having a Nuclear Stress Test,  Echocardiogram, Cardiac CT, MRI, or Chest Xray during the period you would be wearing the  monitor. The patch cannot be worn during these tests. You cannot remove and re-apply the  ZIO XT patch monitor.  Your ZIO patch monitor will be mailed 3 day USPS to your address on file. It may take 3-5 days  to receive your monitor after you have been enrolled.  Once you have received your monitor, please review the enclosed instructions. Your monitor  has already been registered assigning a specific monitor serial # to you.  Billing and Patient Assistance Program Information  We have supplied [employer] with any of your insurance information on file for billing purposes. [employer] offers a sliding scale Patient Assistance Program for patients that do not have  insurance, or whose insurance does not completely cover the cost of the ZIO monitor.  You must apply for the Patient Assistance Program to qualify for this discounted  rate.  To apply, please call [employer] at [phone], select option 4, select option 2, ask to apply for  Patient Assistance Program. Meredeth will ask your household income, and how many people  are in your household. They will quote your out-of-pocket cost based on that information.  [employer] will also be able to set up a 8-month, interest-free payment plan if needed.  Applying the monitor   Shave hair from upper left chest.  Hold abrader disc by orange tab. Rub abrader in 40 strokes over the upper left chest as  indicated in your monitor instructions.  Clean area with 4 enclosed alcohol pads. Let dry.  Apply patch as indicated in monitor instructions. Patch will be placed under collarbone on left  side of chest with arrow pointing upward.  Rub patch adhesive wings for 2 minutes. Remove white label marked 1. Remove the white  label marked 2. Rub patch adhesive wings for 2 additional minutes.  While looking in a mirror, press and release button in center of patch. A small green light will  flash 3-4 times. This will be your only indicator that the monitor has been turned on.  Do not shower for the first 24 hours. You may shower after the first 24 hours.  Press the button if you feel a symptom. You will hear a small click. Record Date, Time and  Symptom in the Patient Logbook.  When you are ready to remove the patch, follow instructions on the last 2 pages of Patient  Logbook. Stick patch  monitor onto the last page of Patient Logbook.  Place Patient Logbook in the blue and white box. Use locking tab on box and tape box closed  securely. The blue and white box has prepaid postage on it. Please place it in the mailbox as  soon as possible. Your physician should have your test results approximately 7 days after the  monitor has been mailed back to [facility].  Call [facility] Customer Care at [phone] if you have questions regarding  your ZIO XT patch monitor. Call them  immediately if you see an orange light blinking on your  monitor.  If your monitor falls off in less than 4 days, contact our Monitor department at [phone].  If your monitor becomes loose or falls off after 4 days call [employer] at [phone] for  suggestions on securing your monitor   Follow-Up:  6 WEEKS WITH DAYNA DUNN PA-C OR ANOTHER EXTENDER   Other Instructions     Endocarditis Information  You may be at risk for developing endocarditis since you have an artificial heart valve or a repaired heart valve. Endocarditis is an infection of the lining of the heart or heart valves. Certain surgical and dental procedures may put you at risk, such as teeth cleaning or other dental procedures or other medical procedures. Notify our office or your dentist before having any dental work or invasive/surgical procedures. You will need to take antibiotics before certain procedures. To prevent endocarditis, maintain good oral health. Seek prompt medical attention for any mouth/gum, skin or urinary tract infections.

## 2024-08-04 ENCOUNTER — Encounter (HOSPITAL_COMMUNITY): Payer: Self-pay

## 2024-08-04 ENCOUNTER — Ambulatory Visit: Payer: Self-pay | Admitting: Physician Assistant

## 2024-08-04 ENCOUNTER — Emergency Department (HOSPITAL_COMMUNITY)
Admission: EM | Admit: 2024-08-04 | Discharge: 2024-08-04 | Disposition: A | Discharge status: Home / Self Care | Payer: Medicare (Managed Care) | Attending: Emergency Medicine | Admitting: Emergency Medicine

## 2024-08-04 ENCOUNTER — Other Ambulatory Visit: Payer: Self-pay

## 2024-08-04 ENCOUNTER — Emergency Department (HOSPITAL_COMMUNITY): Payer: Medicare (Managed Care)

## 2024-08-04 DIAGNOSIS — N189 Chronic kidney disease, unspecified: Secondary | ICD-10-CM | POA: Insufficient documentation

## 2024-08-04 DIAGNOSIS — I5033 Acute on chronic diastolic (congestive) heart failure: Secondary | ICD-10-CM | POA: Diagnosis not present

## 2024-08-04 DIAGNOSIS — R059 Cough, unspecified: Secondary | ICD-10-CM | POA: Insufficient documentation

## 2024-08-04 DIAGNOSIS — Z7901 Long term (current) use of anticoagulants: Secondary | ICD-10-CM | POA: Insufficient documentation

## 2024-08-04 DIAGNOSIS — Z7951 Long term (current) use of inhaled steroids: Secondary | ICD-10-CM | POA: Insufficient documentation

## 2024-08-04 DIAGNOSIS — J449 Chronic obstructive pulmonary disease, unspecified: Secondary | ICD-10-CM | POA: Insufficient documentation

## 2024-08-04 DIAGNOSIS — J3489 Other specified disorders of nose and nasal sinuses: Secondary | ICD-10-CM | POA: Insufficient documentation

## 2024-08-04 DIAGNOSIS — I4891 Unspecified atrial fibrillation: Secondary | ICD-10-CM | POA: Insufficient documentation

## 2024-08-04 DIAGNOSIS — D649 Anemia, unspecified: Secondary | ICD-10-CM | POA: Diagnosis present

## 2024-08-04 DIAGNOSIS — E039 Hypothyroidism, unspecified: Secondary | ICD-10-CM | POA: Insufficient documentation

## 2024-08-04 LAB — CBC
HCT: 28.9 % — ABNORMAL LOW (ref 36.0–46.0)
Hemoglobin: 9.2 g/dL — ABNORMAL LOW (ref 12.0–15.0)
MCH: 30.2 pg (ref 26.0–34.0)
MCHC: 31.8 g/dL (ref 30.0–36.0)
MCV: 94.8 fL (ref 80.0–100.0)
Platelets: 197 K/uL (ref 150–400)
RBC: 3.05 MIL/uL — ABNORMAL LOW (ref 3.87–5.11)
RDW: 14.2 % (ref 11.5–15.5)
WBC: 13.8 K/uL — ABNORMAL HIGH (ref 4.0–10.5)
nRBC: 0 % (ref 0.0–0.2)

## 2024-08-04 LAB — BASIC METABOLIC PANEL WITH GFR
Anion gap: 9 (ref 5–15)
BUN: 17 mg/dL (ref 8–23)
CO2: 24 mmol/L (ref 22–32)
Calcium: 8.9 mg/dL (ref 8.9–10.3)
Chloride: 103 mmol/L (ref 98–111)
Creatinine, Ser: 1.32 mg/dL — ABNORMAL HIGH (ref 0.44–1.00)
GFR, Estimated: 42 mL/min — ABNORMAL LOW (ref >60)
Glucose, Bld: 119 mg/dL — ABNORMAL HIGH (ref 70–99)
Potassium: 4.5 mmol/L (ref 3.5–5.1)
Sodium: 136 mmol/L (ref 135–145)

## 2024-08-04 LAB — BRAIN NATRIURETIC PEPTIDE: B Natriuretic Peptide: 913.7 pg/mL — ABNORMAL HIGH (ref 0.0–100.0)

## 2024-08-04 NOTE — ED Notes (Signed)
 Pt reports she was leaving AMA & EDP made aware, he came to bedside to speak with her.

## 2024-08-04 NOTE — Discharge Instructions (Addendum)
 Hemoglobin was noted to be low today but at this point in time does not require any transfusion.  If you notice any bleeding in your stool, dark stools, blood in your urine, or severe dizziness/weakness please return to the ED.  It is also advised to return to the ED if you have worsening shortness of breath or chest pain.  Please continue to take the antibiotic prescribed and follow all medical recommendations by your other medical providers.  Follow-up with primary care at your scheduled visit tomorrow.

## 2024-08-04 NOTE — ED Provider Notes (Signed)
 [place] EMERGENCY DEPARTMENT AT [facility] Provider Note   CSN: [id number] Arrival date & time: 08/04/24  1218     Patient presents with: Shortness of Breath   Katie [name] is a 78 y.o. female.  78 year old female presents to the ED with complaints of decrease in hemoglobin and sent here per cardiologist recommendation.  Patient has significant history of COPD, CHF, A-fib controlled with Eliquis.  Patient was recently hospitalized for CHF exacerbation and spent 1 week in admission.  Patient denies of any pain but does endorse some increased fatigue the last 2 days.  Patient denies any blood in her stool or urine.  Patient does not endorse any abdominal pain chest pain or shortness of breath worse than her baseline.  Patient is not on any oxygen  at home.     Prior to Admission medications   Medication Sig Start Date End Date Taking? Authorizing Provider  albuterol  (VENTOLIN  HFA) 108 (90 Base) MCG/ACT inhaler Inhale 2 puffs into the lungs every 6 (six) hours as needed for wheezing or shortness of breath. 02/08/20   Gladis Elsie BROCKS, PA-C  apixaban (ELIQUIS) 5 MG TABS tablet Take 1 tablet (5 mg total) by mouth 2 (two) times daily. 07/25/24   Vann, Jessica U, DO  diltiazem (CARDIZEM CD) 120 MG 24 hr capsule Take 1 capsule (120 mg total) by mouth daily. 07/26/24   Vann, Jessica U, DO  Fluticasone -Umeclidin-Vilant (TRELEGY ELLIPTA ) 200-62.5-25 MCG/ACT AEPB Inhale 1 puff into the lungs daily. 03/05/23   Hunsucker, Donnice SAUNDERS, MD  furosemide  (LASIX ) 40 MG tablet Take 1 tablet (40 mg total) by mouth daily as needed for edema or fluid (For weight gain > 2 pounds in 24 hours or > 5 pounds in a week). 07/28/24   Hunsucker, Donnice SAUNDERS, MD  guaiFENesin  (MUCINEX ) 600 MG 12 hr tablet Take 2 tablets (1,200 mg total) by mouth 2 (two) times daily. Patient taking differently: Take 1,200 mg by mouth 2 (two) times daily as needed. 07/25/24   Vann, Jessica U, DO  levalbuterol  (XOPENEX ) 0.63 MG/3ML  nebulizer solution Take 3 mLs (0.63 mg total) by nebulization every 2 (two) hours as needed for wheezing or shortness of breath. 07/25/24   Vann, Jessica U, DO  loratadine (CLARITIN) 10 MG tablet Take 1 tablet (10 mg total) by mouth daily. 07/26/24   Vann, Jessica U, DO  losartan  (COZAAR ) 25 MG tablet Take 1 tablet (25 mg total) by mouth daily. 07/26/24   Vann, Jessica U, DO  methimazole (TAPAZOLE) 5 MG tablet Take 1 tablet (5 mg total) by mouth 2 (two) times daily. 07/25/24   Vann, Jessica U, DO  nicotine  (NICODERM CQ  - DOSED IN MG/24 HOURS) 14 mg/24hr patch Place 1 patch (14 mg total) onto the skin daily. 07/26/24   Vann, Jessica U, DO  pantoprazole  (PROTONIX ) 40 MG tablet Take 1 tablet (40 mg total) by mouth daily. 08/18/22   Zehr, Jessica D, PA-C  predniSONE  (DELTASONE ) 10 MG tablet Take 5 tablets daily for 3 days THEN take 4 tablets daily for 3 days THEN take 3 tablets daily for 3 days THEN take 2 tablets daily for 3 days THEN take 1 tablet daily for 3 days then stop. 07/26/24   Vann, Jessica U, DO  rosuvastatin  (CRESTOR ) 40 MG tablet Take 40 mg by mouth daily. 06/11/22   [provider]    Allergies: Patient has no known allergies.    Review of Systems  Constitutional:  Positive for fatigue.  Respiratory:  Positive  for shortness of breath.   All other systems reviewed and are negative.   Updated Vital Signs BP (!) 150/93   Pulse 75   Temp 98.1 F (36.7 C) (Oral)   Resp 18   SpO2 100%   Physical Exam Vitals and nursing note reviewed.  Constitutional:      Appearance: Normal appearance.  HENT:     Head: Normocephalic and atraumatic.     Nose: Nose normal.  Eyes:     Extraocular Movements: Extraocular movements intact.     Conjunctiva/sclera: Conjunctivae normal.     Pupils: Pupils are equal, round, and reactive to light.  Cardiovascular:     Rate and Rhythm: Normal rate.  Pulmonary:     Effort: Pulmonary effort is normal. No tachypnea, accessory muscle usage or  respiratory distress.     Breath sounds: Examination of the right-upper field reveals wheezing and rhonchi. Examination of the left-upper field reveals wheezing and rhonchi. Examination of the right-middle field reveals wheezing. Examination of the left-middle field reveals wheezing. Wheezing and rhonchi present.  Musculoskeletal:        General: Normal range of motion.     Cervical back: Normal range of motion.     Right lower leg: No edema.     Left lower leg: No edema.  Skin:    General: Skin is warm.     Capillary Refill: Capillary refill takes less than 2 seconds.     Findings: Ecchymosis present.  Neurological:     General: No focal deficit present.     Mental Status: She is alert.  Psychiatric:        Mood and Affect: Mood normal.        Behavior: Behavior normal.     (all labs ordered are listed, but only abnormal results are displayed) Labs Reviewed  BASIC METABOLIC PANEL WITH GFR - Abnormal; Notable for the following components:      Result Value   Glucose, Bld 119 (*)    Creatinine, Ser 1.32 (*)    GFR, Estimated 42 (*)    All other components within normal limits  CBC - Abnormal; Notable for the following components:   WBC 13.8 (*)    RBC 3.05 (*)    Hemoglobin 9.2 (*)    HCT 28.9 (*)    All other components within normal limits  BRAIN NATRIURETIC PEPTIDE - Abnormal; Notable for the following components:   B Natriuretic Peptide 913.7 (*)    All other components within normal limits    EKG: None  Radiology: DG Chest 2 View Result Date: 08/04/2024 CLINICAL DATA:  sob EXAM: CHEST - 2 VIEW COMPARISON:  07/19/2024 FINDINGS: Biapical pleural thickening. Fine reticular opacities throughout both lungs. No focal airspace consolidation, pleural effusion, or pneumothorax. No cardiomegaly. Mitral annuloplasty. Tortuous aorta with aortic atherosclerosis. No acute fracture or destructive lesions. Multilevel thoracic osteophytosis. Osteopenia. IMPRESSION: Fine reticular  opacities throughout both lungs, worrisome for a atypical infection or viral pneumonia. Alternatively, interstitial edema could also have this appearance in the correct clinical context. Electronically Signed   By: Rogelia Myers M.D.   On: 08/04/2024 13:42     Procedures   Medications Ordered in the ED - No data to display  78 y.o. female presents to the ED with complaints of anemia, this involves an extensive number of treatment options, and is a complaint that carries with it a high risk of complications and morbidity.  The differential diagnosis includes GI bleed, IDA, hematuria, CHF exacerbation, pneumonia (  Ddx)  On arrival pt is nontoxic, vitals unremarkable.   Imaging Studies ordered:  I ordered imaging studies which included CXR, I independently visualized and interpreted imaging which showed concern for pneumonia. Patient is already prescribed antibiotic and reports she picked it up today.   ED Course:   78 year old female presents to ED per cardiology recommendations for decreased hemoglobin.   Per cardiology note : she was recently hospitalized 9/27-10/6/25 for acute respiratory failure with hypoxia felt due to viral PNA/rhinovirus with AECOPD, acute on chronic HFpEF, cocaine use, hyperthyroidism, and iron deficiency anemia.  She is not taking her bronchodilators 4 times daily as prescribed but is as needed.  Patient also reports as needed Lasix  that she has not taken yet.  Patient was also prescribed antibiotics when she is not picked up from the pharmacy.  On exam patient is nontoxic-appearing and in no acute distress.  Patient has some rhonchi and expiratory wheeze but denies any shortness of breath.  Patient is noncompliant with 4 nebulized treatments per day prescribed by pulmonology, patient uses it as needed and has not taken any today.  Patient is also not compliant with her Lasix .  Patient is not on oxygen  at baseline and reports that she does not feel more shortness of  breath than normal.  Patient has no pitting edema bilaterally and reports she does not feel like she has been swelling lately.  Patient reports she has been able to do her ADLs without complication.  Patient denies any chest pain.  Patient has not noticed any blood in her urine or stool.  Patient does not have any abdominal pain on exam.  CBC 9.2 down from 9.4 days ago and down from 10.7 weeks ago.  Creatinine and GFR baseline for patient, history of CKD.  Chest x-ray significant for opacities possibly concerning for atypical pneumonia or viral pneumonia.  Patient has been afebrile and denies any chills or increased shortness of breath.  Patient does have cough but reports this is baseline for her as well.  On reassessment patient reports she is trying to leave AMA.  Patient was advised of findings thus far and reports we are waiting for BNP.  She was advised we are not concerned with active bleeding and she is satting at 100% on room air in no respiratory distress. Patient reported she did start her antibiotic today and has her prescribed breathing treatments at home if she would become short of breath. Patient was advised of return precautions and told to monitor for any signs of bleeding. Patient agreed with discharge instructions.    Portions of this note were generated with Scientist, clinical (histocompatibility and immunogenetics). Dictation errors may occur despite best attempts at proofreading.   Final diagnoses:  Anemia, unspecified type    ED Discharge Orders     None          Myriam Fonda RAMAN, [place] 08/05/24 1013    Pamella Sharper A, DO 08/12/24 1616

## 2024-08-04 NOTE — ED Triage Notes (Signed)
 Pt sent here from PCP for hgb of 9.9 and sob. Pt also c.o increased fatigue

## 2024-08-04 NOTE — Telephone Encounter (Signed)
 Spoke with pt and advised per Dayna Dunn,PA recommends pt be further evaluated in the ED due to recurrent anemia.  Pt advised of additional comments and recommendations per PA.  She verbalizes understanding and states she will contact her sister.  Agrees with current plan.

## 2024-08-04 NOTE — ED Notes (Signed)
 Lab called to check on BNP & they reported they will pull it & run it.

## 2024-08-05 ENCOUNTER — Telehealth: Payer: Self-pay | Admitting: Physician Assistant

## 2024-08-05 NOTE — Addendum Note (Signed)
 Addended by: Ronda Kazmi on: 08/05/2024 04:10 PM   Modules accepted: Orders

## 2024-08-05 NOTE — Telephone Encounter (Addendum)
 Sending to both [facility] Triage given priority:  Patient left AMA from ER yesterday. NURSING TEAM: I discussed case with Dr. Deneise who followed her for several days during her recent previous admission. Cardiac recommendations:  - patient planned to see PCP today for recheck, recommend she keep this appt and needs to discuss evaluation of anemia since this was an issue even before she was started on blood thinner - restart Lasix  40mg  once daily for now - please arrange f/u within 1 week - OK to use my [facility] return slot if needed, anticipate getting recheck labs but would plan to order at [facility] based on clinical course - recommend to contact her pulmonologist to be seen back ASAP - Dr. DELENA felt OK to continue Eliquis for now but will need to follow closely going forward - return to hospital if any black stools, blood in stool, unusual bleeding or persistent symptoms   [Provider summary only, do not need to relay to patient: Patient seen in clinic 08/03/24 with continued dyspnea on exertion with rhonchi on exam. Had not yet started next course of abx prescribed by PCP or using nebs 4x/day per pulmonology. Adherence to recommended regimen reinforced. Her labs demonstrated recurrent slight decline in Hgb to 9.4 therefore advised to proceed to ED for evaluation given dyspnea and complex clinical situation. Note 06/2024 hospital values showed Hgb 9-11 even before blood thinner/Eliquis therapy; Hgb was previously 14-16 earlier this year. Unfortunately she left AMA yesterday before workup could be completed. pBNP/BNP elevated, CXR with possible viral PNA/atypical infection versus interstitial edema. I do think her dyspnea is likely multifactorial and will require involvement from primary care/pulmonary with high risk for recurrent admission.]

## 2024-08-05 NOTE — Telephone Encounter (Signed)
 Called pt advised of recommendations previously given today.   Called and spoke w patient. She is feeling good. She had a good night's sleep. She is seeing PCP this morning. Adv to continue Lasix  40 mg and Eliquis and reach out to pulmonary for sooner visit. Adv I will forward the message to them as well, so that they can watch for sooner appointment there.  Per Addie, RN on 08/05/24.    Pt reports PCP checked stool positive for blood.  Reports has an appointment with GI on 08/29/24.  Pt reports has felt tired for over a week.  Again advised of ED precautions.  Pt reports left ED after waiting for 7 hours.  Advised if has urgent concerns needs to go back to the ED.  Will send to provider to make aware.

## 2024-08-05 NOTE — Telephone Encounter (Signed)
 Patient saw PCP today--she says stool was dark and grainy + PCP saw blood in stool. She would like a call back to discuss.

## 2024-08-05 NOTE — Telephone Encounter (Signed)
 Called patient and adv no more Eliquis for now as well as NSAIDs.  She knows to use Tylenol  as needed.  And report to ER if any worsening symptoms.

## 2024-08-05 NOTE — Telephone Encounter (Addendum)
 Noted. I gave the update to Dr. Deneise and unfortunately we have to hold her Eliquis until the question of GI bleeding is evaluated. I am worried this was potentially going on even before she was on Eliquis given that her hemoglobin had declined before she even started it. Unfortunately we have to acknowledge there is a stroke risk being off blood thinner if arrhythmia recurs. Needs to reconsider return to ER because her situation is so complex that this is truly difficult to manage in outpatient setting. Make sure she also knows to avoid all NSAIDS if taking any - BC powders, ibuprofen, Advil, Motrin, naproxen, and Aleve. Also recommend she discuss what to do with her prednisone  with PCP given the possible Gi bleeding. No other new recs aside from what was outlined.

## 2024-08-05 NOTE — Telephone Encounter (Signed)
 Called and spoke w patient.  She is feeling good.  She had a good night's sleep.  She is seeing PCP this morning.  Adv to continue Lasix  40 mg and Eliquis and reach out to pulmonary for sooner visit.  Adv I will forward the message to them as well, so that they can watch for sooner appointment there.     Scheduled her back in cardiology 10/22 with Dayna.  Aware to return to ER for any signs of bleeding.

## 2024-08-06 LAB — CBC

## 2024-08-07 ENCOUNTER — Inpatient Hospital Stay (HOSPITAL_COMMUNITY)
Admission: EM | Admit: 2024-08-07 | Discharge: 2024-08-11 | DRG: 378 | Disposition: A | Discharge status: Home / Self Care | Payer: Medicare (Managed Care) | Attending: Internal Medicine | Admitting: Internal Medicine

## 2024-08-07 ENCOUNTER — Other Ambulatory Visit: Payer: Self-pay

## 2024-08-07 ENCOUNTER — Encounter (HOSPITAL_COMMUNITY): Payer: Self-pay | Admitting: Internal Medicine

## 2024-08-07 DIAGNOSIS — K5730 Diverticulosis of large intestine without perforation or abscess without bleeding: Secondary | ICD-10-CM | POA: Diagnosis present

## 2024-08-07 DIAGNOSIS — D649 Anemia, unspecified: Secondary | ICD-10-CM | POA: Diagnosis not present

## 2024-08-07 DIAGNOSIS — E0590 Thyrotoxicosis, unspecified without thyrotoxic crisis or storm: Secondary | ICD-10-CM | POA: Diagnosis present

## 2024-08-07 DIAGNOSIS — R042 Hemoptysis: Secondary | ICD-10-CM

## 2024-08-07 DIAGNOSIS — K5520 Angiodysplasia of colon without hemorrhage: Secondary | ICD-10-CM

## 2024-08-07 DIAGNOSIS — Z5982 Transportation insecurity: Secondary | ICD-10-CM

## 2024-08-07 DIAGNOSIS — I340 Nonrheumatic mitral (valve) insufficiency: Secondary | ICD-10-CM | POA: Diagnosis present

## 2024-08-07 DIAGNOSIS — I272 Pulmonary hypertension, unspecified: Secondary | ICD-10-CM | POA: Diagnosis present

## 2024-08-07 DIAGNOSIS — N1831 Chronic kidney disease, stage 3a: Secondary | ICD-10-CM | POA: Diagnosis present

## 2024-08-07 DIAGNOSIS — Z79899 Other long term (current) drug therapy: Secondary | ICD-10-CM

## 2024-08-07 DIAGNOSIS — K2101 Gastro-esophageal reflux disease with esophagitis, with bleeding: Principal | ICD-10-CM | POA: Diagnosis present

## 2024-08-07 DIAGNOSIS — I130 Hypertensive heart and chronic kidney disease with heart failure and stage 1 through stage 4 chronic kidney disease, or unspecified chronic kidney disease: Secondary | ICD-10-CM | POA: Diagnosis present

## 2024-08-07 DIAGNOSIS — E663 Overweight: Secondary | ICD-10-CM | POA: Diagnosis present

## 2024-08-07 DIAGNOSIS — D62 Acute posthemorrhagic anemia: Secondary | ICD-10-CM | POA: Diagnosis present

## 2024-08-07 DIAGNOSIS — K3189 Other diseases of stomach and duodenum: Secondary | ICD-10-CM | POA: Diagnosis present

## 2024-08-07 DIAGNOSIS — D124 Benign neoplasm of descending colon: Secondary | ICD-10-CM | POA: Diagnosis present

## 2024-08-07 DIAGNOSIS — Z6826 Body mass index (BMI) 26.0-26.9, adult: Secondary | ICD-10-CM

## 2024-08-07 DIAGNOSIS — D125 Benign neoplasm of sigmoid colon: Secondary | ICD-10-CM | POA: Diagnosis present

## 2024-08-07 DIAGNOSIS — Z860100 Personal history of colon polyps, unspecified: Secondary | ICD-10-CM

## 2024-08-07 DIAGNOSIS — I480 Paroxysmal atrial fibrillation: Secondary | ICD-10-CM | POA: Diagnosis present

## 2024-08-07 DIAGNOSIS — I4892 Unspecified atrial flutter: Secondary | ICD-10-CM | POA: Diagnosis present

## 2024-08-07 DIAGNOSIS — K641 Second degree hemorrhoids: Secondary | ICD-10-CM | POA: Diagnosis present

## 2024-08-07 DIAGNOSIS — Z7901 Long term (current) use of anticoagulants: Secondary | ICD-10-CM

## 2024-08-07 DIAGNOSIS — Z87891 Personal history of nicotine dependence: Secondary | ICD-10-CM

## 2024-08-07 DIAGNOSIS — K2971 Gastritis, unspecified, with bleeding: Principal | ICD-10-CM | POA: Diagnosis present

## 2024-08-07 DIAGNOSIS — Z833 Family history of diabetes mellitus: Secondary | ICD-10-CM

## 2024-08-07 DIAGNOSIS — E7800 Pure hypercholesterolemia, unspecified: Secondary | ICD-10-CM | POA: Diagnosis present

## 2024-08-07 DIAGNOSIS — D509 Iron deficiency anemia, unspecified: Secondary | ICD-10-CM | POA: Diagnosis present

## 2024-08-07 DIAGNOSIS — D122 Benign neoplasm of ascending colon: Secondary | ICD-10-CM | POA: Diagnosis present

## 2024-08-07 DIAGNOSIS — R195 Other fecal abnormalities: Secondary | ICD-10-CM

## 2024-08-07 DIAGNOSIS — E611 Iron deficiency: Secondary | ICD-10-CM

## 2024-08-07 DIAGNOSIS — J449 Chronic obstructive pulmonary disease, unspecified: Secondary | ICD-10-CM | POA: Diagnosis present

## 2024-08-07 DIAGNOSIS — Z66 Do not resuscitate: Secondary | ICD-10-CM | POA: Diagnosis present

## 2024-08-07 DIAGNOSIS — D127 Benign neoplasm of rectosigmoid junction: Secondary | ICD-10-CM | POA: Diagnosis present

## 2024-08-07 DIAGNOSIS — E039 Hypothyroidism, unspecified: Secondary | ICD-10-CM | POA: Diagnosis present

## 2024-08-07 DIAGNOSIS — Z85528 Personal history of other malignant neoplasm of kidney: Secondary | ICD-10-CM

## 2024-08-07 DIAGNOSIS — I714 Abdominal aortic aneurysm, without rupture, unspecified: Secondary | ICD-10-CM | POA: Diagnosis present

## 2024-08-07 DIAGNOSIS — I5032 Chronic diastolic (congestive) heart failure: Secondary | ICD-10-CM | POA: Diagnosis present

## 2024-08-07 DIAGNOSIS — Z7951 Long term (current) use of inhaled steroids: Secondary | ICD-10-CM

## 2024-08-07 DIAGNOSIS — Z860101 Personal history of adenomatous and serrated colon polyps: Secondary | ICD-10-CM

## 2024-08-07 DIAGNOSIS — D123 Benign neoplasm of transverse colon: Secondary | ICD-10-CM | POA: Diagnosis present

## 2024-08-07 DIAGNOSIS — Z905 Acquired absence of kidney: Secondary | ICD-10-CM

## 2024-08-07 DIAGNOSIS — Z825 Family history of asthma and other chronic lower respiratory diseases: Secondary | ICD-10-CM

## 2024-08-07 LAB — COMPREHENSIVE METABOLIC PANEL WITH GFR
ALT: 22 U/L (ref 0–44)
AST: 26 U/L (ref 15–41)
Albumin: 3.6 g/dL (ref 3.5–5.0)
Alkaline Phosphatase: 58 U/L (ref 38–126)
Anion gap: 11 (ref 5–15)
BUN: 28 mg/dL — ABNORMAL HIGH (ref 8–23)
CO2: 24 mmol/L (ref 22–32)
Calcium: 9.7 mg/dL (ref 8.9–10.3)
Chloride: 107 mmol/L (ref 98–111)
Creatinine, Ser: 1.26 mg/dL — ABNORMAL HIGH (ref 0.44–1.00)
GFR, Estimated: 44 mL/min — ABNORMAL LOW (ref >60)
Glucose, Bld: 110 mg/dL — ABNORMAL HIGH (ref 70–99)
Potassium: 4.7 mmol/L (ref 3.5–5.1)
Sodium: 142 mmol/L (ref 135–145)
Total Bilirubin: 0.5 mg/dL (ref 0.0–1.2)
Total Protein: 6 g/dL — ABNORMAL LOW (ref 6.5–8.1)

## 2024-08-07 LAB — CBC
HCT: 26.8 % — ABNORMAL LOW (ref 36.0–46.0)
Hemoglobin: 8.1 g/dL — ABNORMAL LOW (ref 12.0–15.0)
MCH: 29 pg (ref 26.0–34.0)
MCHC: 30.2 g/dL (ref 30.0–36.0)
MCV: 96.1 fL (ref 80.0–100.0)
Platelets: 186 K/uL (ref 150–400)
RBC: 2.79 MIL/uL — ABNORMAL LOW (ref 3.87–5.11)
RDW: 14.2 % (ref 11.5–15.5)
WBC: 10.3 K/uL (ref 4.0–10.5)
nRBC: 0 % (ref 0.0–0.2)

## 2024-08-07 MED ORDER — ACETAMINOPHEN 325 MG PO TABS: 650.0000 mg | ORAL_TABLET | Freq: Four times a day (QID) | ORAL | Status: DC | PRN

## 2024-08-07 MED ORDER — ONDANSETRON HCL 4 MG PO TABS: 4.0000 mg | ORAL_TABLET | Freq: Four times a day (QID) | ORAL | Status: DC | PRN

## 2024-08-07 MED ORDER — ONDANSETRON HCL 4 MG/2ML IJ SOLN: 4.0000 mg | Freq: Four times a day (QID) | INTRAMUSCULAR | Status: DC | PRN

## 2024-08-07 MED ORDER — BUDESON-GLYCOPYRROL-FORMOTEROL 160-9-4.8 MCG/ACT IN AERO
2.0000 | INHALATION_SPRAY | Freq: Two times a day (BID) | RESPIRATORY_TRACT | Status: DC
  Administered 2024-08-07 – 2024-08-11 (×7): 2 via RESPIRATORY_TRACT
  Filled 2024-08-07: qty 5.9

## 2024-08-07 MED ORDER — LORATADINE 10 MG PO TABS
10.0000 mg | ORAL_TABLET | Freq: Every day | ORAL | Status: DC
  Administered 2024-08-08 – 2024-08-11 (×4): 10 mg via ORAL
  Filled 2024-08-07 (×4): qty 1

## 2024-08-07 MED ORDER — ROSUVASTATIN CALCIUM 20 MG PO TABS
40.0000 mg | ORAL_TABLET | Freq: Every day | ORAL | Status: DC
  Administered 2024-08-07 – 2024-08-10 (×4): 40 mg via ORAL
  Filled 2024-08-07 (×4): qty 2

## 2024-08-07 MED ORDER — DILTIAZEM HCL ER COATED BEADS 120 MG PO CP24
120.0000 mg | ORAL_CAPSULE | Freq: Every day | ORAL | Status: DC
  Administered 2024-08-08 – 2024-08-11 (×4): 120 mg via ORAL
  Filled 2024-08-07 (×4): qty 1

## 2024-08-07 MED ORDER — NICOTINE 14 MG/24HR TD PT24
14.0000 mg | MEDICATED_PATCH | Freq: Every day | TRANSDERMAL | Status: DC
  Filled 2024-08-07 (×3): qty 1

## 2024-08-07 MED ORDER — MELATONIN 5 MG PO TABS
5.0000 mg | ORAL_TABLET | Freq: Every evening | ORAL | Status: AC | PRN
  Administered 2024-08-07: 5 mg via ORAL
  Filled 2024-08-07: qty 1

## 2024-08-07 MED ORDER — METHIMAZOLE 5 MG PO TABS
5.0000 mg | ORAL_TABLET | Freq: Two times a day (BID) | ORAL | Status: DC
  Administered 2024-08-07 – 2024-08-11 (×7): 5 mg via ORAL
  Filled 2024-08-07 (×8): qty 1

## 2024-08-07 MED ORDER — LEVALBUTEROL HCL 0.63 MG/3ML IN NEBU
0.6300 mg | INHALATION_SOLUTION | Freq: Four times a day (QID) | RESPIRATORY_TRACT | Status: DC | PRN
  Administered 2024-08-08: 0.63 mg via RESPIRATORY_TRACT
  Filled 2024-08-07 (×2): qty 3

## 2024-08-07 MED ORDER — PANTOPRAZOLE SODIUM 40 MG IV SOLR
40.0000 mg | Freq: Two times a day (BID) | INTRAVENOUS | Status: DC
  Administered 2024-08-07 – 2024-08-11 (×7): 40 mg via INTRAVENOUS
  Filled 2024-08-07 (×7): qty 10

## 2024-08-07 MED ORDER — ACETAMINOPHEN 650 MG RE SUPP: 650.0000 mg | Freq: Four times a day (QID) | RECTAL | Status: DC | PRN

## 2024-08-07 NOTE — ED Notes (Signed)
 ED TO INPATIENT HANDOFF REPORT  ED Nurse Name and Phone #: Manuelita MOTE [phone]  S Name/Age/Gender Katie [name] 78 y.o. female Room/Bed: WA23/WA23  Code Status   Code Status: Limited: Do not attempt resuscitation (DNR) -DNR-LIMITED -Do Not Intubate/DNI   Home/SNF/Other Home Patient oriented to: self, place, time, and situation Is this baseline? Yes   Triage Complete: Triage complete  Chief Complaint Symptomatic anemia [D64.9]  Triage Note Patient to ED by POV with c/o coughing up blood and blood in stool. [facility] and [facility] with no new orders. Her hemoglobin Thursday was 9. She reports fatigue but denies pain.    Allergies No Known Allergies  Level of Care/Admitting Diagnosis ED Disposition     ED Disposition  Admit   Condition  --   Comment  Hospital Area: [facility] [[id number]]  Level of Care: Telemetry [5]  Admit to tele based on following criteria: Complex arrhythmia (Bradycardia/Tachycardia)  May place patient in observation at [facility] or [facility] if equivalent level of care is available:: No  Diagnosis: Symptomatic anemia [8671310]  Admitting Physician: GHERGHE, COSTIN M [[id number]]  Attending Physician: GHERGHE, COSTIN M [phone]  For patients discharging to extended facilities (i.e. SNF, AL, group homes or LTAC) initiate:: Discharge to SNF/Facility Placement COVID-19 Lab Testing Protocol          B Medical/Surgery History Past Medical History:  Diagnosis Date   AAA (abdominal aortic aneurysm)    AKI (acute kidney injury)    Arthritis    Chest pain 04/17/2016   Cholelithiasis 2011   s/p cholescystectomy    Chronic diastolic congestive heart failure (HCC)    COPD (chronic obstructive pulmonary disease) (HCC)    GERD (gastroesophageal reflux disease)    History of renal cell carcinoma    Hypercholesteremia    Hypertension    Hypertension    Mitral regurgitation    Nausea and vomiting 06/19/2022   Pneumonia  1980's   Renal cell carcinoma 01/2010   s/p right radical nephrectomy 01/2010,  followed by [facility]   S/P minimally invasive mitral valve repair 04/20/2019   Complex valvuloplasty including triangular resection of flail segment of posterior leaflet, artificial Gore-tex neochord placement x6 and 30 mm Sorin Memo 4D ring annuloplasty via right mini thoracotomy approach   Shortness of breath    Tuberculosis    Tests positive for PPD. dad had h/o TB.   Past Surgical History:  Procedure Laterality Date   ABDOMINAL AORTIC ENDOVASCULAR STENT GRAFT N/A 01/10/2022   Procedure: ABDOMINAL AORTIC ENDOVASCULAR STENT GRAFT;  Surgeon: Gretta Lonni PARAS, MD;  [facility];  Service: Vascular;  Laterality: N/A;   APPENDECTOMY  1970's   BUBBLE STUDY  03/18/2019   Procedure: BUBBLE STUDY;  Surgeon: Okey Vina GAILS, MD;  Location: [facility] ENDOSCOPY;  Service: Cardiovascular;;   CARDIAC CATHETERIZATION  09/2011   CHOLECYSTECTOMY  01/2010   DIAGNOSTIC LAPAROSCOPY     KIDNEY SURGERY     LEFT AND RIGHT HEART CATHETERIZATION WITH CORONARY ANGIOGRAM N/A 09/30/2011   Procedure: LEFT AND RIGHT HEART CATHETERIZATION WITH CORONARY ANGIOGRAM;  Surgeon: Oneil Parchment, MD;  Location: [facility] CATH LAB;  Service: Cardiovascular;  Laterality: N/A;   MITRAL VALVE REPAIR Right 04/20/2019   Procedure: MINIMALLY INVASIVE MITRAL VALVE REPAIR (MVR) USING MEMO 4D SIZE 30;  Surgeon: Dusty Sudie DEL, MD;  Location: [facility] OR;  Service: Open Heart Surgery;  Laterality: Right;   MULTIPLE EXTRACTIONS WITH ALVEOLOPLASTY  10/06/2011   Procedure: MULTIPLE  EXTRACION WITH ALVEOLOPLASTY;  Surgeon: Tanda JULIANNA Fanny, DDS;  Location: [facility] OR;  Service: Oral Surgery;  Laterality: N/A;  Multiple extraction of tooth #'s 1, 6, 8, 10, 11, 22, 23, 26, 27, 28, 29 with alveoloplasty and Upper right buccal exostoses reductions.   NEPHRECTOMY RADICAL  01/2010   right    ORIF WRIST FRACTURE Right 02/17/2022   Procedure: OPEN REDUCTION INTERNAL FIXATION (ORIF)  WRIST FRACTURE;  Surgeon: Shari Easter, MD;  Location: [facility] OR;  Service: Orthopedics;  Laterality: Right;  with IV sedation   OVARIAN CYST REMOVAL  1970's   went through belly button   RIGHT HEART CATH N/A 07/20/2024   Procedure: RIGHT HEART CATH;  Surgeon: Cherrie Toribio SAUNDERS, MD;  Location: [facility] INVASIVE CV LAB;  Service: Cardiovascular;  Laterality: N/A;   RIGHT/LEFT HEART CATH AND CORONARY ANGIOGRAPHY N/A 03/22/2019   Procedure: RIGHT/LEFT HEART CATH AND CORONARY ANGIOGRAPHY;  Surgeon: Dann Candyce RAMAN, MD;  Location: [facility] INVASIVE CV LAB;  Service: Cardiovascular;  Laterality: N/A;   TEE WITHOUT CARDIOVERSION  10/01/2011   Procedure: TRANSESOPHAGEAL ECHOCARDIOGRAM (TEE);  Surgeon: Candyce CANDIE Dann;  Location: [facility] ENDOSCOPY;  Service: Cardiovascular;  Laterality: N/A;   TEE WITHOUT CARDIOVERSION N/A 03/18/2019   Procedure: TRANSESOPHAGEAL ECHOCARDIOGRAM (TEE);  Surgeon: Okey Vina GAILS, MD;  Location: [facility] ENDOSCOPY;  Service: Cardiovascular;  Laterality: N/A;   TEE WITHOUT CARDIOVERSION N/A 04/20/2019   Procedure: TRANSESOPHAGEAL ECHOCARDIOGRAM (TEE);  Surgeon: Dusty Sudie DEL, MD;  Location: [facility] OR;  Service: Open Heart Surgery;  Laterality: N/A;   TUBAL LIGATION  1970's   US  ECHOCARDIOGRAPHY  09/2011     A IV Location/Drains/Wounds Patient Lines/Drains/Airways Status     Active Line/Drains/Airways     Name Placement date Placement time Site Days   Peripheral IV 08/07/24 20 G 1 Left Antecubital 08/07/24  1338  Antecubital  less than 1            Intake/Output Last 24 hours No intake or output data in the 24 hours ending 08/07/24 2025  Labs/Imaging Results for orders placed or performed during the hospital encounter of 08/07/24 (from the past 48 hours)  Comprehensive metabolic panel     Status: Abnormal   Collection Time: 08/07/24  1:37 PM  Result Value Ref Range   Sodium 142 135 - 145 mmol/L   Potassium 4.7 3.5 - 5.1 mmol/L    Comment: HEMOLYSIS AT THIS LEVEL MAY AFFECT  RESULT   Chloride 107 98 - 111 mmol/L   CO2 24 22 - 32 mmol/L   Glucose, Bld 110 (H) 70 - 99 mg/dL    Comment: Glucose reference range applies only to samples taken after fasting for at least 8 hours.   BUN 28 (H) 8 - 23 mg/dL   Creatinine, Ser 8.73 (H) 0.44 - 1.00 mg/dL   Calcium  9.7 8.9 - 10.3 mg/dL   Total Protein 6.0 (L) 6.5 - 8.1 g/dL   Albumin  3.6 3.5 - 5.0 g/dL   AST 26 15 - 41 U/L    Comment: HEMOLYSIS AT THIS LEVEL MAY AFFECT RESULT   ALT 22 0 - 44 U/L   Alkaline Phosphatase 58 38 - 126 U/L   Total Bilirubin 0.5 0.0 - 1.2 mg/dL   GFR, Estimated 44 (L) >60 mL/min    Comment: (NOTE) Calculated using the CKD-EPI Creatinine Equation (2021)    Anion gap 11 5 - 15    Comment: Performed at [facility], [address] [place], [place] [address]  CBC  Status: Abnormal   Collection Time: 08/07/24  1:37 PM  Result Value Ref Range   WBC 10.3 4.0 - 10.5 K/uL   RBC 2.79 (L) 3.87 - 5.11 MIL/uL   Hemoglobin 8.1 (L) 12.0 - 15.0 g/dL   HCT 73.1 (L) 63.9 - 53.9 %   MCV 96.1 80.0 - 100.0 fL   MCH 29.0 26.0 - 34.0 pg   MCHC 30.2 30.0 - 36.0 g/dL   RDW 85.7 88.4 - 84.4 %   Platelets 186 150 - 400 K/uL   nRBC 0.0 0.0 - 0.2 %    Comment: Performed at [facility], [address], [place], [place] [address]  Type and screen [facility]  [facility]OS[facility]ITAL     Status: None   Collection Time: 08/07/24  1:37 PM  Result Value Ref Range   ABO/RH(D) A POS    Antibody Screen NEG    Sample Expiration      08/10/2024,2359 Performed at [facility], [address] [place], [place] [address]    No results found.  Pending Labs Unresulted Labs (From admission, onward)     Start     Ordered   08/08/24 0500  Comprehensive metabolic panel  Tomorrow morning,   R        08/07/24 1934   08/08/24 0500  CBC  Tomorrow morning,   R        08/07/24 1934            Vitals/Pain Today's Vitals   08/07/24 1834 08/07/24 1835 08/07/24  1915 08/07/24 1930  BP: (!) 139/91 (!) 139/91 132/82 134/76  Pulse: 69 67 79   Resp: 18     Temp: 98.2 F (36.8 C)     TempSrc: Oral     SpO2: 100% 100% 99%   Weight:      Height:      PainSc:        Isolation Precautions No active isolations  Medications Medications  acetaminophen  (TYLENOL ) tablet 650 mg (has no administration in time range)    Or  acetaminophen  (TYLENOL ) suppository 650 mg (has no administration in time range)  ondansetron  (ZOFRAN ) tablet 4 mg (has no administration in time range)    Or  ondansetron  (ZOFRAN ) injection 4 mg (has no administration in time range)  pantoprazole  (PROTONIX ) injection 40 mg (has no administration in time range)    Mobility walks     Focused Assessments     R Recommendations: See Admitting Provider Note  Report given to:   Additional Notes:

## 2024-08-07 NOTE — ED Triage Notes (Signed)
 Patient to ED by POV with c/o coughing up blood and blood in stool. Reports [facility] and was DC with no new orders. Her hemoglobin Thursday was 9. She reports fatigue but denies pain.

## 2024-08-07 NOTE — ED Provider Notes (Signed)
 [place] EMERGENCY DEPARTMENT AT [facility] Provider Note   CSN: [id number] Arrival date & time: 08/07/24  1258     Patient presents with: Blood In Stools (Coughing up blood )   Katie [name] is a 78 y.o. female.   HPI Reports that she is coughing up blood.  She reports has blood-streaked sputum.  She denies chest pain.  She has not had fever.  Patient reports he is chronically short of breath and cannot really tell that is much different than her baseline.  Patient reports her stool has also been dark.  She just stopped taking Eliquis yesterday.  Patient denies any pain anywhere.  She reports she has become extremely fatigued.  She reports she is sleeping a lot and if she is out for a few hours then she goes back to sleep again.    Prior to Admission medications   Medication Sig Start Date End Date Taking? Authorizing Provider  albuterol  (VENTOLIN  HFA) 108 (90 Base) MCG/ACT inhaler Inhale 2 puffs into the lungs every 6 (six) hours as needed for wheezing or shortness of breath. 02/08/20   Gladis Elsie BROCKS, PA-C  diltiazem (CARDIZEM CD) 120 MG 24 hr capsule Take 1 capsule (120 mg total) by mouth daily. 07/26/24   Vann, Jessica U, DO  Fluticasone -Umeclidin-Vilant (TRELEGY ELLIPTA ) 200-62.5-25 MCG/ACT AEPB Inhale 1 puff into the lungs daily. 03/05/23   Hunsucker, Donnice SAUNDERS, MD  furosemide  (LASIX ) 40 MG tablet Take 1 tablet (40 mg total) by mouth daily as needed for edema or fluid (For weight gain > 2 pounds in 24 hours or > 5 pounds in a week). 07/28/24   Hunsucker, Donnice SAUNDERS, MD  guaiFENesin  (MUCINEX ) 600 MG 12 hr tablet Take 2 tablets (1,200 mg total) by mouth 2 (two) times daily. Patient taking differently: Take 1,200 mg by mouth 2 (two) times daily as needed. 07/25/24   Vann, Jessica U, DO  levalbuterol  (XOPENEX ) 0.63 MG/3ML nebulizer solution Take 3 mLs (0.63 mg total) by nebulization every 2 (two) hours as needed for wheezing or shortness of breath. 07/25/24   Vann, Jessica  U, DO  loratadine (CLARITIN) 10 MG tablet Take 1 tablet (10 mg total) by mouth daily. 07/26/24   Vann, Jessica U, DO  losartan  (COZAAR ) 25 MG tablet Take 1 tablet (25 mg total) by mouth daily. 07/26/24   Vann, Jessica U, DO  methimazole (TAPAZOLE) 5 MG tablet Take 1 tablet (5 mg total) by mouth 2 (two) times daily. 07/25/24   Vann, Jessica U, DO  nicotine  (NICODERM CQ  - DOSED IN MG/24 HOURS) 14 mg/24hr patch Place 1 patch (14 mg total) onto the skin daily. 07/26/24   Vann, Jessica U, DO  pantoprazole  (PROTONIX ) 40 MG tablet Take 1 tablet (40 mg total) by mouth daily. 08/18/22   Zehr, Jessica D, PA-C  predniSONE  (DELTASONE ) 10 MG tablet Take 5 tablets daily for 3 days THEN take 4 tablets daily for 3 days THEN take 3 tablets daily for 3 days THEN take 2 tablets daily for 3 days THEN take 1 tablet daily for 3 days then stop. 07/26/24   Vann, Jessica U, DO  rosuvastatin  (CRESTOR ) 40 MG tablet Take 40 mg by mouth daily. 06/11/22   [provider]    Allergies: Patient has no known allergies.    Review of Systems  Updated Vital Signs BP 134/76   Pulse 79   Temp 98.2 F (36.8 C) (Oral)   Resp 18   Ht 5' 1 (1.549 m)  Wt 63 kg   SpO2 99%   BMI 26.24 kg/m   Physical Exam Constitutional:      Comments: Alert nontoxic no respiratory distress at rest.  HENT:     Mouth/Throat:     Pharynx: Oropharynx is clear.  Eyes:     Extraocular Movements: Extraocular movements intact.  Cardiovascular:     Rate and Rhythm: Normal rate and regular rhythm.  Pulmonary:     Comments: No respiratory distress at rest.  Bilateral expiratory wheeze.  Occasional wet cough. Abdominal:     General: There is no distension.     Palpations: Abdomen is soft.     Tenderness: There is no abdominal tenderness. There is no guarding.  Musculoskeletal:        General: No swelling or tenderness. Normal range of motion.     Right lower leg: No edema.     Left lower leg: No edema.     Comments: No peripheral edema.   Calves are soft and nontender.  Skin:    General: Skin is warm and dry.  Neurological:     General: No focal deficit present.     Mental Status: She is oriented to person, place, and time.     Motor: No weakness.     Coordination: Coordination normal.     Comments: Mental status is clear.  Cognitive function normal.  Speech normal content and cadence.  Movements coordinated purposeful symmetric.  Psychiatric:        Mood and Affect: Mood normal.     (all labs ordered are listed, but only abnormal results are displayed) Labs Reviewed  COMPREHENSIVE METABOLIC PANEL WITH GFR - Abnormal; Notable for the following components:      Result Value   Glucose, Bld 110 (*)    BUN 28 (*)    Creatinine, Ser 1.26 (*)    Total Protein 6.0 (*)    GFR, Estimated 44 (*)    All other components within normal limits  CBC - Abnormal; Notable for the following components:   RBC 2.79 (*)    Hemoglobin 8.1 (*)    HCT 26.8 (*)    All other components within normal limits  COMPREHENSIVE METABOLIC PANEL WITH GFR  CBC  POC OCCULT BLOOD, ED  TYPE AND SCREEN    EKG: None  Radiology: No results found.   Procedures   Medications Ordered in the ED  acetaminophen  (TYLENOL ) tablet 650 mg (has no administration in time range)    Or  acetaminophen  (TYLENOL ) suppository 650 mg (has no administration in time range)  ondansetron  (ZOFRAN ) tablet 4 mg (has no administration in time range)    Or  ondansetron  (ZOFRAN ) injection 4 mg (has no administration in time range)  pantoprazole  (PROTONIX ) injection 40 mg (has no administration in time range)                                    Medical Decision Making Amount and/or Complexity of Data Reviewed Labs: ordered.  Risk Decision regarding hospitalization.    Patient presents as outlined.  She had been anticoagulated up until yesterday.  She is having hemoptysis.  She has increasing and persisting weakness.  Will recheck hemoglobin and basic lab  work.  Hemoglobin is 8.1, this is decreased from 9.2 08/04/24  days ago.  GFR 44.  Potassium 4.7.  Patient has CT PE study 9\28\25. No PE present.  Small bilateral pleural  effusions.  Emphysema.  At this time with worsening fatigue and general weakness in the setting of hemoglobin down another gram per deciliter in in 48 hours.  Patient just stopped anticoagulants yesterday.  Will plan for admission for hemoptysis and symptomatic anemia.  Patient is alert.  She does not have respiratory distress at rest.  Currently stable.    Final diagnoses:  Symptomatic anemia  Hemoptysis  Occult blood in stools    ED Discharge Orders     None          Armenta Canning, MD 08/07/24 [phone]

## 2024-08-07 NOTE — H&P (Signed)
 History and Physical    Katie [name] FMW:[id number] DOB: 1946/08/03 DOA: 08/07/2024  I have briefly reviewed the patient's prior medical records in [facility]  PCP: Katie Leni Edyth DELENA, MD  Patient coming from: home  Chief Complaint: weakness, fatigue, low hemoglobin   HPI: Katie [name] is a 78 y.o. female with medical history significant of COPD seen pulmonary as an outpatient, pulmonary hypertension, chronic diastolic CHF, HTN, HLD, CKD 3A, AAA with graft comes into the hospital with complaints of weakness, fatigue.  She was hospitalized and discharged about 2 weeks ago for COPD exacerbation, and while being in the hospital she tells me she has started to see black stools, and has seen them since.  She did not think a whole lot about them, but with her increasing fatigue, she was also noted to have decreased hemoglobin as an outpatient, saw her PCP few days ago and a fecal occult was positive.  This was also discussed with her cardiologist and she has been holding her Eliquis since yesterday.  Given persistent symptoms she decided to come to the emergency room.  She denies any nausea or vomiting, denies any abdominal pain.  She denies any fever or chills.  She does report 1 episode of coughing up scant amount of blood today, and this is never happened before.  She denies any significant shortness of breath more so than her baseline.  ED Course: In the emergency room she is afebrile, normotensive, satting well on room air.  Blood work shows a creatinine of 1.26, and a hemoglobin of 8.1, from 10.7 about 2 weeks ago. Chest x-ray in the ED with concerns for atypical infection versus edema.  Given worsening hemoglobin, concern for GI bleed we are asked to admit  Review of Systems: All systems reviewed, and apart from HPI, all negative  Past Medical History:  Diagnosis Date   AAA (abdominal aortic aneurysm)    AKI (acute kidney injury)    Arthritis    Chest pain 04/17/2016    Cholelithiasis 2011   s/p cholescystectomy    Chronic diastolic congestive heart failure (HCC)    COPD (chronic obstructive pulmonary disease) (HCC)    GERD (gastroesophageal reflux disease)    History of renal cell carcinoma    Hypercholesteremia    Hypertension    Hypertension    Mitral regurgitation    Nausea and vomiting 06/19/2022   Pneumonia 1980's   Renal cell carcinoma 01/2010   s/p right radical nephrectomy 01/2010,  followed by [facility]   S/P minimally invasive mitral valve repair 04/20/2019   Complex valvuloplasty including triangular resection of flail segment of posterior leaflet, artificial Gore-tex neochord placement x6 and 30 mm Sorin Memo 4D ring annuloplasty via right mini thoracotomy approach   Shortness of breath    Tuberculosis    Tests positive for PPD. dad had h/o TB.    Past Surgical History:  Procedure Laterality Date   ABDOMINAL AORTIC ENDOVASCULAR STENT GRAFT N/A 01/10/2022   Procedure: ABDOMINAL AORTIC ENDOVASCULAR STENT GRAFT;  Surgeon: Katie Lonni PARAS, MD;  Location: [facility] OR;  Service: Vascular;  Laterality: N/A;   APPENDECTOMY  1970's   BUBBLE STUDY  03/18/2019   Procedure: BUBBLE STUDY;  Surgeon: Katie Vina GAILS, MD;  Location: [facility] ENDOSCOPY;  Service: Cardiovascular;;   CARDIAC CATHETERIZATION  09/2011   CHOLECYSTECTOMY  01/2010   DIAGNOSTIC LAPAROSCOPY     KIDNEY SURGERY     LEFT AND RIGHT HEART CATHETERIZATION WITH CORONARY ANGIOGRAM N/A 09/30/2011  Procedure: LEFT AND RIGHT HEART CATHETERIZATION WITH CORONARY ANGIOGRAM;  Surgeon: Katie Parchment, MD;  Location: [facility] CATH LAB;  Service: Cardiovascular;  Laterality: N/A;   MITRAL VALVE REPAIR Right 04/20/2019   Procedure: MINIMALLY INVASIVE MITRAL VALVE REPAIR (MVR) USING MEMO 4D SIZE 30;  Surgeon: Katie Sudie DEL, MD;  Location: [facility] OR;  Service: Open Heart Surgery;  Laterality: Right;   MULTIPLE EXTRACTIONS WITH ALVEOLOPLASTY  10/06/2011   Procedure: MULTIPLE EXTRACION WITH ALVEOLOPLASTY;   Surgeon: Katie [name], DDS;  Location: [facility] OR;  Service: Oral Surgery;  Laterality: N/A;  Multiple extraction of tooth #'s 1, 6, 8, 10, 11, 22, 23, 26, 27, 28, 29 with alveoloplasty and Upper right buccal exostoses reductions.   NEPHRECTOMY RADICAL  01/2010   right    ORIF WRIST FRACTURE Right 02/17/2022   Procedure: OPEN REDUCTION INTERNAL FIXATION (ORIF) WRIST FRACTURE;  Surgeon: Shari Easter, MD;  Location: [facility] OR;  Service: Orthopedics;  Laterality: Right;  with IV sedation   OVARIAN CYST REMOVAL  1970's   went through belly button   RIGHT HEART CATH N/A 07/20/2024   Procedure: RIGHT HEART CATH;  Surgeon: Katie Toribio SAUNDERS, MD;  Location: [facility] INVASIVE CV LAB;  Service: Cardiovascular;  Laterality: N/A;   RIGHT/LEFT HEART CATH AND CORONARY ANGIOGRAPHY N/A 03/22/2019   Procedure: RIGHT/LEFT HEART CATH AND CORONARY ANGIOGRAPHY;  Surgeon: Katie Katie RAMAN, MD;  Location: [facility] INVASIVE CV LAB;  Service: Cardiovascular;  Laterality: N/A;   TEE WITHOUT CARDIOVERSION  10/01/2011   Procedure: TRANSESOPHAGEAL ECHOCARDIOGRAM (TEE);  Surgeon: Katie [name];  Location: [facility] ENDOSCOPY;  Service: Cardiovascular;  Laterality: N/A;   TEE WITHOUT CARDIOVERSION N/A 03/18/2019   Procedure: TRANSESOPHAGEAL ECHOCARDIOGRAM (TEE);  Surgeon: Katie Vina GAILS, MD;  Location: [facility] ENDOSCOPY;  Service: Cardiovascular;  Laterality: N/A;   TEE WITHOUT CARDIOVERSION N/A 04/20/2019   Procedure: TRANSESOPHAGEAL ECHOCARDIOGRAM (TEE);  Surgeon: Katie Sudie DEL, MD;  Location: [facility] OR;  Service: Open Heart Surgery;  Laterality: N/A;   TUBAL LIGATION  1970's   US  ECHOCARDIOGRAPHY  09/2011     reports that she quit smoking about 5 years ago. Her smoking use included cigarettes. She started smoking about 58 years ago. She has a 26.5 pack-year smoking history. She has been exposed to tobacco smoke. She has never used smokeless tobacco. She reports that she does not currently use alcohol. She reports current drug use. Drug:  Cocaine.  No Known Allergies  Family History  Problem Relation Age of Onset   COPD Mother        DECEASED/SMOKED   Diabetes Brother    Diabetes Brother    Colon cancer Neg Hx    Rectal cancer Neg Hx    Esophageal cancer Neg Hx    Stomach cancer Neg Hx     Prior to Admission medications   Medication Sig Start Date End Date Taking? Authorizing Provider  albuterol  (VENTOLIN  HFA) 108 (90 Base) MCG/ACT inhaler Inhale 2 puffs into the lungs every 6 (six) hours as needed for wheezing or shortness of breath. 02/08/20   Gladis Elsie BROCKS, PA-C  diltiazem (CARDIZEM CD) 120 MG 24 hr capsule Take 1 capsule (120 mg total) by mouth daily. 07/26/24   Vann, Jessica U, DO  Fluticasone -Umeclidin-Vilant (TRELEGY ELLIPTA ) 200-62.5-25 MCG/ACT AEPB Inhale 1 puff into the lungs daily. 03/05/23   Hunsucker, Donnice SAUNDERS, MD  furosemide  (LASIX ) 40 MG tablet Take 1 tablet (40 mg total) by mouth daily as needed for edema or fluid (For weight gain > 2 pounds in  24 hours or > 5 pounds in a week). 07/28/24   Hunsucker, Donnice SAUNDERS, MD  guaiFENesin  (MUCINEX ) 600 MG 12 hr tablet Take 2 tablets (1,200 mg total) by mouth 2 (two) times daily. Patient taking differently: Take 1,200 mg by mouth 2 (two) times daily as needed. 07/25/24   Vann, Jessica U, DO  levalbuterol  (XOPENEX ) 0.63 MG/3ML nebulizer solution Take 3 mLs (0.63 mg total) by nebulization every 2 (two) hours as needed for wheezing or shortness of breath. 07/25/24   Vann, Jessica U, DO  loratadine (CLARITIN) 10 MG tablet Take 1 tablet (10 mg total) by mouth daily. 07/26/24   Vann, Jessica U, DO  losartan  (COZAAR ) 25 MG tablet Take 1 tablet (25 mg total) by mouth daily. 07/26/24   Vann, Jessica U, DO  methimazole (TAPAZOLE) 5 MG tablet Take 1 tablet (5 mg total) by mouth 2 (two) times daily. 07/25/24   Vann, Jessica U, DO  nicotine  (NICODERM CQ  - DOSED IN MG/24 HOURS) 14 mg/24hr patch Place 1 patch (14 mg total) onto the skin daily. 07/26/24   Vann, Jessica U, DO  pantoprazole   (PROTONIX ) 40 MG tablet Take 1 tablet (40 mg total) by mouth daily. 08/18/22   Zehr, Jessica D, PA-C  predniSONE  (DELTASONE ) 10 MG tablet Take 5 tablets daily for 3 days THEN take 4 tablets daily for 3 days THEN take 3 tablets daily for 3 days THEN take 2 tablets daily for 3 days THEN take 1 tablet daily for 3 days then stop. 07/26/24   Vann, Jessica U, DO  rosuvastatin  (CRESTOR ) 40 MG tablet Take 40 mg by mouth daily. 06/11/22   [provider]    Physical Exam: Vitals:   08/07/24 1615 08/07/24 1630 08/07/24 1645 08/07/24 1717  BP: 134/68 125/72 123/67 132/76  Pulse: 73  71 72  Resp:      Temp:      TempSrc:      SpO2: 98%  98% 99%  Weight:      Height:       Constitutional: NAD, calm, comfortable Eyes: PERRL, lids and conjunctivae normal ENMT: Mucous membranes are moist. Neck: normal, supple Respiratory: Diffuse faint end expiratory wheezing, no crackles.  Normal respiratory effort Cardiovascular: Regular rate and rhythm, no murmurs / rubs / gallops. No extremity edema.   Abdomen: no tenderness, no masses palpated. Bowel sounds positive.  Musculoskeletal: no clubbing / cyanosis. Normal muscle tone.  Skin: no rashes, lesions, ulcers. No induration Neurologic: No focal deficits, equal strength  Labs on Admission: I have personally reviewed following labs and imaging studies  CBC: Recent Labs  Lab 08/03/24 1513 08/03/24 1524 08/04/24 1222 08/07/24 1337  WBC WILL FOLLOW 13.6* 13.8* 10.3  HGB WILL FOLLOW 9.4* 9.2* 8.1*  HCT WILL FOLLOW 29.0* 28.9* 26.8*  MCV WILL FOLLOW 92 94.8 96.1  PLT WILL FOLLOW 207 197 186   Basic Metabolic Panel: Recent Labs  Lab 08/03/24 1513 08/04/24 1222 08/07/24 1337  NA 140 136 142  K 5.0 4.5 4.7  CL 105 103 107  CO2 23 24 24   GLUCOSE 100* 119* 110*  BUN 21 17 28*  CREATININE 1.16* 1.32* 1.26*  CALCIUM  9.1 8.9 9.7   Liver Function Tests: Recent Labs  Lab 08/07/24 1337  AST 26  ALT 22  ALKPHOS 58  BILITOT 0.5  PROT 6.0*   ALBUMIN  3.6   Coagulation Profile: No results for input(s): INR, PROTIME in the last 168 hours. BNP (last 3 results) Recent Labs    07/17/24  0033 08/03/24 1513  PROBNP 7,015.0* 3,069*   CBG: No results for input(s): GLUCAP in the last 168 hours. Thyroid  Function Tests: No results for input(s): TSH, T4TOTAL, FREET4, T3FREE, THYROIDAB in the last 72 hours. Urine analysis:    Component Value Date/Time   COLORURINE STRAW (A) 06/01/2024 0847   APPEARANCEUR CLEAR 06/01/2024 0847   LABSPEC 1.044 (H) 06/01/2024 0847   PHURINE 7.0 06/01/2024 0847   GLUCOSEU NEGATIVE 06/01/2024 0847   GLUCOSEU NEGATIVE 04/29/2017 1122   HGBUR NEGATIVE 06/01/2024 0847   BILIRUBINUR NEGATIVE 06/01/2024 0847   BILIRUBINUR negative 06/11/2018 0911   KETONESUR NEGATIVE 06/01/2024 0847   PROTEINUR NEGATIVE 06/01/2024 0847   UROBILINOGEN 0.2 02/27/2022 1912   NITRITE NEGATIVE 06/01/2024 0847   LEUKOCYTESUR NEGATIVE 06/01/2024 0847     Radiological Exams on Admission: No results found.  EKG: Independently reviewed. Sinus rhythm on monitor  Assessment/Plan Principal problem Symptomatic anemia-patient has been having a gradual decline in her hemoglobin of 2 points over the last couple of weeks.  At the same time she also noticed dark tarry stools. - Current hemoglobin 8.1, no need for transfusions, monitor and transfuse for hemoglobin less than 7 - GI contacted for nonurgent morning consultation, message sent to Dr. Abran  Active problems COPD -she was recently hospitalized for COPD exacerbation 2 weeks ago.  She tells me she is feeling a whole lot better than she was and breathing is somewhat close to baseline.  Has some wheezing on exam but it is felt like this is her baseline.  She just finished a 15-day prednisone  taper which was prescribed when she was discharged at the beginning of October - Stable on room air, continue home inhalers  Hypothyroidism-she was started on methimazole  last time she was hospitalized, will need outpatient follow-up with endocrinology.  Of note, thyrotropin receptor antibody, thyroperoxidase antibodies were all negative.  On 9/28 TSH was 0.28, free T4 slightly up at 1.3  PAF /flutter-last time she was hospitalized went into a flutter with RVR into the 160s, started on diltiazem and converted to sinus rhythm.  Monitor on telemetry while here - Eliquis is on hold as above  Chronic diastolic CHF, pulmonary hypertension -clinically appears euvolemic right now.  Follows with cardiology as an outpatient.  Most recent 2D echocardiogram done July 17, 2024 shows LVEF 75%, hyperdynamic function, normal RV size and systolic function. - Resume home medications once med rec is done  CKD 3A-creatinine at baseline  Severe MR-status post mitral valve repair in 2020.  Most recent 2D echo without acute findings  Essential hypertension -beta-blocker was discontinued last time she was in the hospital due to reported cocaine use  AAA-monitored as an outpatient  DVT prophylaxis: SCDs  Code Status: DNR per patient   Family Communication: no family at bedside  Bed Type: telemetry Consults called: GI, Dr Abran  Obs/Inp: Observation   Nilda Fendt, MD, PhD [facility]  Contact via [email or web address]  08/07/2024, 5:39 PM

## 2024-08-08 ENCOUNTER — Encounter (HOSPITAL_COMMUNITY): Payer: Self-pay | Admitting: Internal Medicine

## 2024-08-08 ENCOUNTER — Observation Stay (HOSPITAL_COMMUNITY): Payer: Medicare (Managed Care) | Admitting: Certified Registered"

## 2024-08-08 ENCOUNTER — Encounter (HOSPITAL_COMMUNITY)
Admission: EM | Disposition: A | Discharge status: Home / Self Care | Payer: Self-pay | Source: Home / Self Care | Attending: Internal Medicine

## 2024-08-08 DIAGNOSIS — I10 Essential (primary) hypertension: Secondary | ICD-10-CM

## 2024-08-08 DIAGNOSIS — Z87891 Personal history of nicotine dependence: Secondary | ICD-10-CM | POA: Diagnosis not present

## 2024-08-08 DIAGNOSIS — K3189 Other diseases of stomach and duodenum: Secondary | ICD-10-CM | POA: Diagnosis not present

## 2024-08-08 DIAGNOSIS — D649 Anemia, unspecified: Secondary | ICD-10-CM | POA: Diagnosis not present

## 2024-08-08 DIAGNOSIS — K2289 Other specified disease of esophagus: Secondary | ICD-10-CM

## 2024-08-08 DIAGNOSIS — J449 Chronic obstructive pulmonary disease, unspecified: Secondary | ICD-10-CM | POA: Diagnosis not present

## 2024-08-08 DIAGNOSIS — D509 Iron deficiency anemia, unspecified: Secondary | ICD-10-CM

## 2024-08-08 DIAGNOSIS — Z7901 Long term (current) use of anticoagulants: Secondary | ICD-10-CM | POA: Diagnosis not present

## 2024-08-08 DIAGNOSIS — K921 Melena: Secondary | ICD-10-CM | POA: Diagnosis not present

## 2024-08-08 HISTORY — PX: ESOPHAGOGASTRODUODENOSCOPY: SHX5428

## 2024-08-08 LAB — CBC
HCT: 23.2 % — ABNORMAL LOW (ref 36.0–46.0)
Hemoglobin: 7.1 g/dL — ABNORMAL LOW (ref 12.0–15.0)
MCH: 29.5 pg (ref 26.0–34.0)
MCHC: 30.6 g/dL (ref 30.0–36.0)
MCV: 96.3 fL (ref 80.0–100.0)
Platelets: 146 K/uL — ABNORMAL LOW (ref 150–400)
RBC: 2.41 MIL/uL — ABNORMAL LOW (ref 3.87–5.11)
RDW: 14.1 % (ref 11.5–15.5)
WBC: 7.4 K/uL (ref 4.0–10.5)
nRBC: 0 % (ref 0.0–0.2)

## 2024-08-08 LAB — COMPREHENSIVE METABOLIC PANEL WITH GFR
ALT: 17 U/L (ref 0–44)
AST: 14 U/L — ABNORMAL LOW (ref 15–41)
Albumin: 3.2 g/dL — ABNORMAL LOW (ref 3.5–5.0)
Alkaline Phosphatase: 50 U/L (ref 38–126)
Anion gap: 8 (ref 5–15)
BUN: 32 mg/dL — ABNORMAL HIGH (ref 8–23)
CO2: 24 mmol/L (ref 22–32)
Calcium: 9.3 mg/dL (ref 8.9–10.3)
Chloride: 106 mmol/L (ref 98–111)
Creatinine, Ser: 1.4 mg/dL — ABNORMAL HIGH (ref 0.44–1.00)
GFR, Estimated: 39 mL/min — ABNORMAL LOW (ref >60)
Glucose, Bld: 92 mg/dL (ref 70–99)
Potassium: 4.9 mmol/L (ref 3.5–5.1)
Sodium: 138 mmol/L (ref 135–145)
Total Bilirubin: 0.4 mg/dL (ref 0.0–1.2)
Total Protein: 5.2 g/dL — ABNORMAL LOW (ref 6.5–8.1)

## 2024-08-08 LAB — BASIC METABOLIC PANEL WITH GFR
BUN/Creatinine Ratio: 18 (ref 12–28)
BUN: 21 mg/dL (ref 8–27)
CO2: 23 mmol/L (ref 20–29)
Calcium: 9.1 mg/dL (ref 8.7–10.3)
Chloride: 105 mmol/L (ref 96–106)
Creatinine, Ser: 1.16 mg/dL — ABNORMAL HIGH (ref 0.57–1.00)
Glucose: 100 mg/dL — ABNORMAL HIGH (ref 70–99)
Potassium: 5 mmol/L (ref 3.5–5.2)
Sodium: 140 mmol/L (ref 134–144)
eGFR: 49 mL/min/1.73 — ABNORMAL LOW (ref >59)

## 2024-08-08 LAB — HEMOGLOBIN AND HEMATOCRIT, BLOOD
HCT: 23.5 % — ABNORMAL LOW (ref 36.0–46.0)
HCT: 26.5 % — ABNORMAL LOW (ref 36.0–46.0)
Hemoglobin: 7.3 g/dL — ABNORMAL LOW (ref 12.0–15.0)
Hemoglobin: 8 g/dL — ABNORMAL LOW (ref 12.0–15.0)

## 2024-08-08 LAB — PRO B NATRIURETIC PEPTIDE: NT-Pro BNP: 3069 pg/mL — ABNORMAL HIGH (ref 0–738)

## 2024-08-08 SURGERY — EGD (ESOPHAGOGASTRODUODENOSCOPY)
Anesthesia: Monitor Anesthesia Care

## 2024-08-08 MED ORDER — SODIUM CHLORIDE 0.9 % IV SOLN: INTRAVENOUS | Status: AC

## 2024-08-08 MED ORDER — NA SULFATE-K SULFATE-MG SULF 17.5-3.13-1.6 GM/177ML PO SOLN
0.5000 | Freq: Once | ORAL | Status: AC
  Administered 2024-08-09: 177 mL via ORAL

## 2024-08-08 MED ORDER — CARMEX CLASSIC LIP BALM EX OINT
TOPICAL_OINTMENT | CUTANEOUS | Status: DC | PRN
  Filled 2024-08-08: qty 10

## 2024-08-08 MED ORDER — DEXAMETHASONE SOD PHOSPHATE PF 10 MG/ML IJ SOLN
INTRAMUSCULAR | Status: DC | PRN
  Administered 2024-08-08: 5 mg via INTRAVENOUS

## 2024-08-08 MED ORDER — PHENYLEPHRINE 80 MCG/ML (10ML) SYRINGE FOR IV PUSH (FOR BLOOD PRESSURE SUPPORT)
PREFILLED_SYRINGE | INTRAVENOUS | Status: DC | PRN
  Administered 2024-08-08: 80 ug via INTRAVENOUS
  Administered 2024-08-08: 100 ug via INTRAVENOUS

## 2024-08-08 MED ORDER — GLYCOPYRROLATE PF 0.2 MG/ML IJ SOSY
PREFILLED_SYRINGE | INTRAMUSCULAR | Status: DC | PRN
  Administered 2024-08-08: .1 mg via INTRAVENOUS

## 2024-08-08 MED ORDER — NA SULFATE-K SULFATE-MG SULF 17.5-3.13-1.6 GM/177ML PO SOLN
0.5000 | Freq: Once | ORAL | Status: AC
  Administered 2024-08-08: 177 mL via ORAL
  Filled 2024-08-08: qty 1

## 2024-08-08 MED ORDER — LIDOCAINE HCL (CARDIAC) PF 100 MG/5ML IV SOSY
PREFILLED_SYRINGE | INTRAVENOUS | Status: DC | PRN
  Administered 2024-08-08: 80 mg via INTRAVENOUS

## 2024-08-08 MED ORDER — PROPOFOL 10 MG/ML IV BOLUS
INTRAVENOUS | Status: DC | PRN
  Administered 2024-08-08: 120 ug/kg/min via INTRAVENOUS
  Administered 2024-08-08: 80 mg via INTRAVENOUS

## 2024-08-08 MED ORDER — PROPOFOL 500 MG/50ML IV EMUL
INTRAVENOUS | Status: AC
  Filled 2024-08-08: qty 50

## 2024-08-08 MED ORDER — FENTANYL CITRATE (PF) 100 MCG/2ML IJ SOLN
INTRAMUSCULAR | Status: AC
  Filled 2024-08-08: qty 2

## 2024-08-08 NOTE — Transfer of Care (Signed)
 Immediate Anesthesia Transfer of Care Note  Patient: Katie [name]  Procedure(s) Performed: EGD (ESOPHAGOGASTRODUODENOSCOPY)  Patient Location: PACU  Anesthesia Type:MAC  Level of Consciousness: awake  Airway & Oxygen  Therapy: Patient Spontanous Breathing  Post-op Assessment: Report given to RN and Post -op Vital signs reviewed and stable  Post vital signs: Reviewed and stable  Last Vitals:  Vitals Value Taken Time  BP 103/62 08/08/24 16:22  Temp    Pulse 72 08/08/24 16:23  Resp 20 08/08/24 16:23  SpO2 98 % 08/08/24 16:23  Vitals shown include unfiled device data.  Last Pain:  Vitals:   08/08/24 1357  TempSrc: Temporal  PainSc: 0-No pain         Complications: No notable events documented.

## 2024-08-08 NOTE — Progress Notes (Signed)
   08/08/24 1007  TOC Brief Assessment  Insurance and Status Reviewed  Patient has primary care physician Yes Anastacio, Leni Edyth LABOR, MD)  Home environment has been reviewed Home  Prior level of function: Independent  Prior/Current Home Services No current home services  Social Drivers of Health Review SDOH reviewed no interventions necessary  Readmission risk has been reviewed Yes  Transition of care needs no transition of care needs at this time

## 2024-08-08 NOTE — Plan of Care (Signed)
   Problem: Education: Goal: Knowledge of General Education information will improve Description: Including pain rating scale, medication(s)/side effects and non-pharmacologic comfort measures Outcome: Progressing   Problem: Pain Managment: Goal: General experience of comfort will improve and/or be controlled Outcome: Progressing   Problem: Safety: Goal: Ability to remain free from injury will improve Outcome: Progressing

## 2024-08-08 NOTE — H&P (Signed)
 GASTROENTEROLOGY PROCEDURE H&P NOTE   Primary Care Physician: [name], MD  HPI: Katie [name] is a 78 y.o. female who presents for EGD for evaluation of acute blood loss anemia, dark stools, chronic anticoagulation rule out PUD.  Past Medical History:  Diagnosis Date   AAA (abdominal aortic aneurysm)    AKI (acute kidney injury)    Arthritis    Chest pain 04/17/2016   Cholelithiasis 2011   s/p cholescystectomy    Chronic diastolic congestive heart failure (HCC)    COPD (chronic obstructive pulmonary disease) (HCC)    GERD (gastroesophageal reflux disease)    History of renal cell carcinoma    Hypercholesteremia    Hypertension    Hypertension    Mitral regurgitation    Nausea and vomiting 06/19/2022   Pneumonia 1980's   Renal cell carcinoma 01/2010   s/p right radical nephrectomy 01/2010,  followed by [facility]   S/P minimally invasive mitral valve repair 04/20/2019   Complex valvuloplasty including triangular resection of flail segment of posterior leaflet, artificial Gore-tex neochord placement x6 and 30 mm Sorin Memo 4D ring annuloplasty via right mini thoracotomy approach   Shortness of breath    Tuberculosis    Tests positive for PPD. dad had h/o TB.   Past Surgical History:  Procedure Laterality Date   ABDOMINAL AORTIC ENDOVASCULAR STENT GRAFT N/A 01/10/2022   Procedure: ABDOMINAL AORTIC ENDOVASCULAR STENT GRAFT;  Surgeon: Gretta Lonni PARAS, MD;  Location: [facility] OR;  Service: Vascular;  Laterality: N/A;   APPENDECTOMY  1970's   BUBBLE STUDY  03/18/2019   Procedure: BUBBLE STUDY;  Surgeon: Okey Vina GAILS, MD;  Location: [facility] ENDOSCOPY;  Service: Cardiovascular;;   CARDIAC CATHETERIZATION  09/2011   CHOLECYSTECTOMY  01/2010   DIAGNOSTIC LAPAROSCOPY     KIDNEY SURGERY     LEFT AND RIGHT HEART CATHETERIZATION WITH CORONARY ANGIOGRAM N/A 09/30/2011   Procedure: LEFT AND RIGHT HEART CATHETERIZATION WITH CORONARY ANGIOGRAM;  Surgeon: Oneil Parchment, MD;   Location: [facility] CATH LAB;  Service: Cardiovascular;  Laterality: N/A;   MITRAL VALVE REPAIR Right 04/20/2019   Procedure: MINIMALLY INVASIVE MITRAL VALVE REPAIR (MVR) USING MEMO 4D SIZE 30;  Surgeon: Dusty Sudie DEL, MD;  Location: [facility] OR;  Service: Open Heart Surgery;  Laterality: Right;   MULTIPLE EXTRACTIONS WITH ALVEOLOPLASTY  10/06/2011   Procedure: MULTIPLE EXTRACION WITH ALVEOLOPLASTY;  Surgeon: Tanda JULIANNA Fanny, DDS;  Location: [facility] OR;  Service: Oral Surgery;  Laterality: N/A;  Multiple extraction of tooth #'s 1, 6, 8, 10, 11, 22, 23, 26, 27, 28, 29 with alveoloplasty and Upper right buccal exostoses reductions.   NEPHRECTOMY RADICAL  01/2010   right    ORIF WRIST FRACTURE Right 02/17/2022   Procedure: OPEN REDUCTION INTERNAL FIXATION (ORIF) WRIST FRACTURE;  Surgeon: Shari Easter, MD;  Location: [facility] OR;  Service: Orthopedics;  Laterality: Right;  with IV sedation   OVARIAN CYST REMOVAL  1970's   went through belly button   RIGHT HEART CATH N/A 07/20/2024   Procedure: RIGHT HEART CATH;  Surgeon: Cherrie Toribio SAUNDERS, MD;  Location: [facility] INVASIVE CV LAB;  Service: Cardiovascular;  Laterality: N/A;   RIGHT/LEFT HEART CATH AND CORONARY ANGIOGRAPHY N/A 03/22/2019   Procedure: RIGHT/LEFT HEART CATH AND CORONARY ANGIOGRAPHY;  Surgeon: Dann Candyce RAMAN, MD;  Location: [facility] INVASIVE CV LAB;  Service: Cardiovascular;  Laterality: N/A;   TEE WITHOUT CARDIOVERSION  10/01/2011   Procedure: TRANSESOPHAGEAL ECHOCARDIOGRAM (TEE);  Surgeon: Candyce CANDIE Dann;  Location: [facility] ENDOSCOPY;  Service:  Cardiovascular;  Laterality: N/A;   TEE WITHOUT CARDIOVERSION N/A 03/18/2019   Procedure: TRANSESOPHAGEAL ECHOCARDIOGRAM (TEE);  Surgeon: Okey Vina GAILS, MD;  Location: [facility] ENDOSCOPY;  Service: Cardiovascular;  Laterality: N/A;   TEE WITHOUT CARDIOVERSION N/A 04/20/2019   Procedure: TRANSESOPHAGEAL ECHOCARDIOGRAM (TEE);  Surgeon: Dusty Sudie DEL, MD;  Location: [facility] OR;  Service: Open Heart Surgery;  Laterality: N/A;   TUBAL  LIGATION  1970's   US  ECHOCARDIOGRAPHY  09/2011   Current Facility-Administered Medications  Medication Dose Route Frequency Provider Last Rate Last Admin   0.9 %  sodium chloride  infusion   Intravenous Continuous Zehr, Jessica D, PA-C       [MAR Hold] acetaminophen  (TYLENOL ) tablet 650 mg  650 mg Oral Q6H PRN Gherghe, Costin M, MD       Or   ILDA Hold] acetaminophen  (TYLENOL ) suppository 650 mg  650 mg Rectal Q6H PRN Gherghe, Costin M, MD       [MAR Hold] budesonide -glycopyrrolate -formoterol  (BREZTRI ) 160-9-4.8 MCG/ACT inhaler 2 puff  2 puff Inhalation BID Gherghe, Costin M, MD   2 puff at 08/08/24 0815   [MAR Hold] diltiazem (CARDIZEM CD) 24 hr capsule 120 mg  120 mg Oral Daily Gherghe, Costin M, MD   120 mg at 08/08/24 0832   [MAR Hold] levalbuterol  (XOPENEX ) nebulizer solution 0.63 mg  0.63 mg Nebulization Q6H PRN Gherghe, Costin M, MD   0.63 mg at 08/08/24 1220   [MAR Hold] loratadine (CLARITIN) tablet 10 mg  10 mg Oral Daily Gherghe, Costin M, MD   10 mg at 08/08/24 0833   [MAR Hold] melatonin tablet 5 mg  5 mg Oral QHS PRN Daniels, James K, NP   5 mg at 08/07/24 2258   [MAR Hold] methimazole (TAPAZOLE) tablet 5 mg  5 mg Oral BID Gherghe, Costin M, MD   5 mg at 08/08/24 9166   [facility] Hold] nicotine  (NICODERM CQ  - dosed in mg/24 hours) patch 14 mg  14 mg Transdermal Daily Gherghe, Costin M, MD       [MAR Hold] ondansetron  (ZOFRAN ) tablet 4 mg  4 mg Oral Q6H PRN Gherghe, Costin M, MD       Or   ILDA Hold] ondansetron  (ZOFRAN ) injection 4 mg  4 mg Intravenous Q6H PRN Gherghe, Costin M, MD       [MAR Hold] pantoprazole  (PROTONIX ) injection 40 mg  40 mg Intravenous Q12H Gherghe, Costin M, MD   40 mg at 08/08/24 0832   [MAR Hold] rosuvastatin  (CRESTOR ) tablet 40 mg  40 mg Oral QHS Gherghe, Costin M, MD   40 mg at 08/07/24 2320    Current Facility-Administered Medications:    0.9 %  sodium chloride  infusion, , Intravenous, Continuous, Zehr, Jessica D, PA-C   [MAR Hold] acetaminophen  (TYLENOL )  tablet 650 mg, 650 mg, Oral, Q6H PRN **OR** [MAR Hold] acetaminophen  (TYLENOL ) suppository 650 mg, 650 mg, Rectal, Q6H PRN, Trixie, Costin M, MD   [MAR Hold] budesonide -glycopyrrolate -formoterol  (BREZTRI ) 160-9-4.8 MCG/ACT inhaler 2 puff, 2 puff, Inhalation, BID, Gherghe, Costin M, MD, 2 puff at 08/08/24 0815   [MAR Hold] diltiazem (CARDIZEM CD) 24 hr capsule 120 mg, 120 mg, Oral, Daily, Gherghe, Costin M, MD, 120 mg at 08/08/24 0832   [facility] Hold] levalbuterol  (XOPENEX ) nebulizer solution 0.63 mg, 0.63 mg, Nebulization, Q6H PRN, Gherghe, Costin M, MD, 0.63 mg at 08/08/24 1220   [MAR Hold] loratadine (CLARITIN) tablet 10 mg, 10 mg, Oral, Daily, Gherghe, Costin M, MD, 10 mg at 08/08/24 0833   [MAR Hold] melatonin tablet  5 mg, 5 mg, Oral, QHS PRN, Daniels, James K, NP, 5 mg at 08/07/24 2258   [MAR Hold] methimazole (TAPAZOLE) tablet 5 mg, 5 mg, Oral, BID, Gherghe, Costin M, MD, 5 mg at 08/08/24 0833   [MAR Hold] nicotine  (NICODERM CQ  - dosed in mg/24 hours) patch 14 mg, 14 mg, Transdermal, Daily, Gherghe, Costin M, MD   [MAR Hold] ondansetron  (ZOFRAN ) tablet 4 mg, 4 mg, Oral, Q6H PRN **OR** [MAR Hold] ondansetron  (ZOFRAN ) injection 4 mg, 4 mg, Intravenous, Q6H PRN, Gherghe, Costin M, MD   [MAR Hold] pantoprazole  (PROTONIX ) injection 40 mg, 40 mg, Intravenous, Q12H, Gherghe, Costin M, MD, 40 mg at 08/08/24 0832   [MAR Hold] rosuvastatin  (CRESTOR ) tablet 40 mg, 40 mg, Oral, QHS, Gherghe, Costin M, MD, 40 mg at 08/07/24 2320 No Known Allergies Family History  Problem Relation Age of Onset   COPD Mother        DECEASED/SMOKED   Diabetes Brother    Diabetes Brother    Colon cancer Neg Hx    Rectal cancer Neg Hx    Esophageal cancer Neg Hx    Stomach cancer Neg Hx    Social History   Socioeconomic History   Marital status: Widowed    Spouse name: Not on file   Number of children: 3   Years of education: 12   Highest education level: Not on file  Occupational History   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    Comment: manages cleaning business  Tobacco Use   Smoking status: Former    Current packs/day: 0.00    Average packs/day: 0.5 packs/day for 53.0 years (26.5 ttl pk-yrs)    Types: Cigarettes    Start date: 03/17/1966    Quit date: 03/18/2019    Years since quitting: 5.3    Passive exposure: Current (Son who lives with her Smokes)   Smokeless tobacco: Never  Vaping Use   Vaping status: Never Used  Substance and Sexual Activity   Alcohol use: Not Currently   Drug use: Yes    Types: Cocaine    Comment: 04/28/2024   Sexual activity: Not Currently  Other Topics Concern   Not on file  Social History Narrative          Lives in [place] with her son.   Retired   Chief Executive Officer Drivers of Corporate investment banker Strain: Not on [employer] Insecurity: No Food Insecurity (08/07/2024)   Hunger Vital Sign    Worried About Running Out of Food in the Last Year: Never true    Ran Out of Food in the Last Year: Never true  Transportation Needs: Unmet Transportation Needs (08/07/2024)   PRAPARE - Administrator, Civil Service (Medical): Yes    Lack of Transportation (Non-Medical): Yes  Physical Activity: Not on file  Stress: Not on file  Social Connections: Moderately Isolated (08/07/2024)   Social Connection and Isolation Panel    Frequency of Communication with Friends and Family: Twice a week    Frequency of Social Gatherings with Friends and Family: Twice a week    Attends Religious Services: 1 to 4 times per year    Active Member of [employer] or Organizations: No    Attends Banker Meetings: Never    Marital Status: Widowed  Intimate Partner Violence: Not At Risk (08/07/2024)   Humiliation, Afraid, Rape, and Kick questionnaire    Fear of Current or Ex-Partner: No    Emotionally Abused:  No    Physically Abused: No    Sexually Abused: No    Physical Exam: Today's Vitals   08/08/24 0551 08/08/24 0815 08/08/24 1046  08/08/24 1357  BP: 132/68   (!) 142/80  Pulse: 78   71  Resp:    (!) 21  Temp: 98.4 F (36.9 C)   (!) 97.1 F (36.2 C)  TempSrc: Oral   Temporal  SpO2: 100% 100%  98%  Weight:    63 kg  Height:    5' 1 (1.549 m)  PainSc: 0-No pain  0-No pain 0-No pain   Body mass index is 26.24 kg/m. GEN: NAD EYE: Sclerae anicteric ENT: MMM CV: Non-tachycardic GI: Soft, NT/ND NEURO:  Alert & Oriented x 3  Lab Results: Recent Labs    08/07/24 1337 08/08/24 0507 08/08/24 1149  WBC 10.3 7.4  --   HGB 8.1* 7.1* 7.3*  HCT 26.8* 23.2* 23.5*  PLT 186 146*  --    BMET Recent Labs    08/07/24 1337 08/08/24 0507  NA 142 138  K 4.7 4.9  CL 107 106  CO2 24 24  GLUCOSE 110* 92  BUN 28* 32*  CREATININE 1.26* 1.40*  CALCIUM  9.7 9.3   LFT Recent Labs    08/08/24 0507  PROT 5.2*  ALBUMIN  3.2*  AST 14*  ALT 17  ALKPHOS 50  BILITOT 0.4   PT/INR No results for input(s): LABPROT, INR in the last 72 hours.   Impression / Plan: This is a 78 y.o.female who presents for EGD for evaluation of acute blood loss anemia, dark stools, chronic anticoagulation rule out PUD.  The risks and benefits of endoscopic evaluation/treatment were discussed with the patient and/or family; these include but are not limited to the risk of perforation, infection, bleeding, missed lesions, lack of diagnosis, severe illness requiring hospitalization, as well as anesthesia and sedation related illnesses.  The patient's history has been reviewed, patient examined, no change in status, and deemed stable for procedure.  The patient and/or family is agreeable to proceed.    Aloha Finner, MD [facility] Office # [phone]

## 2024-08-08 NOTE — Consult Note (Cosign Needed Addendum)
 Referring Provider: Dr. Trixie, TRH Primary Care Physician:  [name], MD Primary Gastroenterologist:  Dr. Charlanne  Reason for Consultation:  Anemia and heme positive stool  HPI: Katie [name] is a 78 y.o. female with medical history significant for COPD sees pulmonary as an outpatient, pulmonary hypertension, chronic diastolic CHF, HTN, HLD, CKD 3A, AAA with graft who came to the hospital with complaints of weakness, fatigue.  She was hospitalized and discharged about 2 weeks ago for COPD exacerbation (was placed on prednisone ), and while being in the hospital she tells me she has started to see dark stools, and has seen them since.  Describes them as gritty.  She did not think a whole lot about them, but with her increasing fatigue, she was also noted to have decreased hemoglobin as an outpatient, saw her PCP few days ago and a fecal occult was positive.  This was also discussed with her cardiologist and she has been holding her Eliquis since Friday.  Given persistent symptoms she decided to come to the emergency room.  She denies any nausea or vomiting, denies any abdominal pain.  Moves her bowels regularly.  No red blood noted in her stools.   Hemoglobin a month ago looks like it was 14.6 g so really has been trending down over the past month.  Now hemoglobin 7.1 g here this morning.  BUN is up at 32, but creatinine up slightly as well.  Hemoccult at PCPs office was positive.  She says that he breathing is doing much better.  Coughing up some mucus with blood-tinge at times.  EGD 03/2022: - LA Grade A reflux esophagitis with no bleeding. Biopsied. - Mild gastritis. Biopsied. - Normal examined duodenum. Biopsied.  1. Surgical [P], duodenum - DUODENAL MUCOSA WITH NO SPECIFIC PATHOLOGIC DIAGNOSIS. - NORMAL LONG AND SLENDER VILLI, NEGATIVE FOR PATHOLOGIC INTRAEPITHELIAL LYMPHOCYTOSIS. 2. Surgical [P], gastric antrum and gastric body minimal gastritis - GASTRIC OXYNTOMUCINOUS AND  OXYNTIC MUCOSA WITH NO SPECIFIC PATHOLOGIC DIAGNOSIS. - NEGATIVE FOR AN INFLAMMATORY PATTERN PREDICTIVE OF HELICOBACTER PYLORI INFECTION. - NEGATIVE FOR INTESTINAL METAPLASIA AND MALIGNANCY. 3. Surgical [P], distal esophagus-esophagitis - REACTIVE SQUAMOCOLUMNAR MUCOSA WITH FEATURES CONSISTENT WITH REFLUX EFFECT (BASAL HYPERPLASIA, ELONGATED PAPILLAE, PARAKERATOSIS). - NEGATIVE FOR INTESTINAL METAPLASIA. - ACUTE INFLAMMATORY CELL ULCER EXUDATE. - NEGATIVE FOR MALIGNANCY. - NO VIRAL CYTOPATHIC CHANGE IDENTIFIED. - GMS STAIN FOR FUNGI PENDING, TO BE REPORTED IN AN ADDENDUM.  Never had a colonoscopy.   Past Medical History:  Diagnosis Date   AAA (abdominal aortic aneurysm)    AKI (acute kidney injury)    Arthritis    Chest pain 04/17/2016   Cholelithiasis 2011   s/p cholescystectomy    Chronic diastolic congestive heart failure (HCC)    COPD (chronic obstructive pulmonary disease) (HCC)    GERD (gastroesophageal reflux disease)    History of renal cell carcinoma    Hypercholesteremia    Hypertension    Hypertension    Mitral regurgitation    Nausea and vomiting 06/19/2022   Pneumonia 1980's   Renal cell carcinoma 01/2010   s/p right radical nephrectomy 01/2010,  followed by [facility]   S/P minimally invasive mitral valve repair 04/20/2019   Complex valvuloplasty including triangular resection of flail segment of posterior leaflet, artificial Gore-tex neochord placement x6 and 30 mm Sorin Memo 4D ring annuloplasty via right mini thoracotomy approach   Shortness of breath    Tuberculosis    Tests positive for PPD. dad had h/o TB.    Past  Surgical History:  Procedure Laterality Date   ABDOMINAL AORTIC ENDOVASCULAR STENT GRAFT N/A 01/10/2022   Procedure: ABDOMINAL AORTIC ENDOVASCULAR STENT GRAFT;  Surgeon: Gretta Lonni PARAS, MD;  Location: [facility] OR;  Service: Vascular;  Laterality: N/A;   APPENDECTOMY  1970's   BUBBLE STUDY  03/18/2019   Procedure: BUBBLE STUDY;   Surgeon: Okey Vina GAILS, MD;  Location: [facility] ENDOSCOPY;  Service: Cardiovascular;;   CARDIAC CATHETERIZATION  09/2011   CHOLECYSTECTOMY  01/2010   DIAGNOSTIC LAPAROSCOPY     KIDNEY SURGERY     LEFT AND RIGHT HEART CATHETERIZATION WITH CORONARY ANGIOGRAM N/A 09/30/2011   Procedure: LEFT AND RIGHT HEART CATHETERIZATION WITH CORONARY ANGIOGRAM;  Surgeon: Oneil Parchment, MD;  Location: [facility] CATH LAB;  Service: Cardiovascular;  Laterality: N/A;   MITRAL VALVE REPAIR Right 04/20/2019   Procedure: MINIMALLY INVASIVE MITRAL VALVE REPAIR (MVR) USING MEMO 4D SIZE 30;  Surgeon: Dusty Sudie DEL, MD;  Location: [facility] OR;  Service: Open Heart Surgery;  Laterality: Right;   MULTIPLE EXTRACTIONS WITH ALVEOLOPLASTY  10/06/2011   Procedure: MULTIPLE EXTRACION WITH ALVEOLOPLASTY;  Surgeon: Tanda JULIANNA Fanny, DDS;  Location: [facility] OR;  Service: Oral Surgery;  Laterality: N/A;  Multiple extraction of tooth #'s 1, 6, 8, 10, 11, 22, 23, 26, 27, 28, 29 with alveoloplasty and Upper right buccal exostoses reductions.   NEPHRECTOMY RADICAL  01/2010   right    ORIF WRIST FRACTURE Right 02/17/2022   Procedure: OPEN REDUCTION INTERNAL FIXATION (ORIF) WRIST FRACTURE;  Surgeon: Shari Easter, MD;  Location: [facility] OR;  Service: Orthopedics;  Laterality: Right;  with IV sedation   OVARIAN CYST REMOVAL  1970's   went through belly button   RIGHT HEART CATH N/A 07/20/2024   Procedure: RIGHT HEART CATH;  Surgeon: Cherrie Toribio SAUNDERS, MD;  Location: [facility] INVASIVE CV LAB;  Service: Cardiovascular;  Laterality: N/A;   RIGHT/LEFT HEART CATH AND CORONARY ANGIOGRAPHY N/A 03/22/2019   Procedure: RIGHT/LEFT HEART CATH AND CORONARY ANGIOGRAPHY;  Surgeon: Dann Candyce RAMAN, MD;  Location: [facility] INVASIVE CV LAB;  Service: Cardiovascular;  Laterality: N/A;   TEE WITHOUT CARDIOVERSION  10/01/2011   Procedure: TRANSESOPHAGEAL ECHOCARDIOGRAM (TEE);  Surgeon: Candyce CANDIE Dann;  Location: [facility] ENDOSCOPY;  Service: Cardiovascular;  Laterality: N/A;   TEE WITHOUT  CARDIOVERSION N/A 03/18/2019   Procedure: TRANSESOPHAGEAL ECHOCARDIOGRAM (TEE);  Surgeon: Okey Vina GAILS, MD;  Location: [facility] ENDOSCOPY;  Service: Cardiovascular;  Laterality: N/A;   TEE WITHOUT CARDIOVERSION N/A 04/20/2019   Procedure: TRANSESOPHAGEAL ECHOCARDIOGRAM (TEE);  Surgeon: Dusty Sudie DEL, MD;  Location: [facility] OR;  Service: Open Heart Surgery;  Laterality: N/A;   TUBAL LIGATION  1970's   US  ECHOCARDIOGRAPHY  09/2011    Prior to Admission medications   Medication Sig Start Date End Date Taking? Authorizing Provider  albuterol  (VENTOLIN  HFA) 108 (90 Base) MCG/ACT inhaler Inhale 2 puffs into the lungs every 6 (six) hours as needed for wheezing or shortness of breath. 02/08/20  Yes Gladis Elsie BROCKS, PA-C  diltiazem (CARDIZEM CD) 120 MG 24 hr capsule Take 1 capsule (120 mg total) by mouth daily. 07/26/24  Yes Vann, Jessica U, DO  doxycycline  (MONODOX ) 100 MG capsule Take 100 mg by mouth 2 (two) times daily. 08/01/24  Yes [provider]  escitalopram (LEXAPRO) 10 MG tablet Take 10 mg by mouth daily. 07/29/24  Yes [provider]  Fluticasone -Umeclidin-Vilant (TRELEGY ELLIPTA ) 200-62.5-25 MCG/ACT AEPB Inhale 1 puff into the lungs daily. 03/05/23  Yes Hunsucker, Donnice SAUNDERS, MD  furosemide  (LASIX ) 40 MG tablet Take 1  tablet (40 mg total) by mouth daily as needed for edema or fluid (For weight gain > 2 pounds in 24 hours or > 5 pounds in a week). 07/28/24  Yes Hunsucker, Donnice SAUNDERS, MD  guaiFENesin  (MUCINEX ) 600 MG 12 hr tablet Take 2 tablets (1,200 mg total) by mouth 2 (two) times daily. Patient taking differently: Take 1,200 mg by mouth 2 (two) times daily as needed. 07/25/24  Yes Vann, Jessica U, DO  levalbuterol  (XOPENEX ) 0.63 MG/3ML nebulizer solution Take 3 mLs (0.63 mg total) by nebulization every 2 (two) hours as needed for wheezing or shortness of breath. 07/25/24  Yes Vann, Jessica U, DO  loratadine (CLARITIN) 10 MG tablet Take 1 tablet (10 mg total) by mouth daily. 07/26/24  Yes  Vann, Jessica U, DO  losartan  (COZAAR ) 25 MG tablet Take 1 tablet (25 mg total) by mouth daily. 07/26/24  Yes Vann, Jessica U, DO  methimazole (TAPAZOLE) 5 MG tablet Take 1 tablet (5 mg total) by mouth 2 (two) times daily. 07/25/24  Yes Vann, Jessica U, DO  nicotine  (NICODERM CQ  - DOSED IN MG/24 HOURS) 14 mg/24hr patch Place 1 patch (14 mg total) onto the skin daily. 07/26/24  Yes Vann, Jessica U, DO  pantoprazole  (PROTONIX ) 40 MG tablet Take 1 tablet (40 mg total) by mouth daily. 08/18/22  Yes Zehr, Jessica D, PA-C  predniSONE  (DELTASONE ) 10 MG tablet Take 5 tablets daily for 3 days THEN take 4 tablets daily for 3 days THEN take 3 tablets daily for 3 days THEN take 2 tablets daily for 3 days THEN take 1 tablet daily for 3 days then stop. 07/26/24  Yes Vann, Jessica U, DO  rosuvastatin  (CRESTOR ) 40 MG tablet Take 40 mg by mouth daily. 06/11/22  Yes [provider]    Current Facility-Administered Medications  Medication Dose Route Frequency Provider Last Rate Last Admin   acetaminophen  (TYLENOL ) tablet 650 mg  650 mg Oral Q6H PRN Gherghe, Costin M, MD       Or   acetaminophen  (TYLENOL ) suppository 650 mg  650 mg Rectal Q6H PRN Gherghe, Costin M, MD       budesonide -glycopyrrolate -formoterol  (BREZTRI ) 160-9-4.8 MCG/ACT inhaler 2 puff  2 puff Inhalation BID Gherghe, Costin M, MD   2 puff at 08/08/24 0815   diltiazem (CARDIZEM CD) 24 hr capsule 120 mg  120 mg Oral Daily Gherghe, Costin M, MD   120 mg at 08/08/24 9167   levalbuterol  (XOPENEX ) nebulizer solution 0.63 mg  0.63 mg Nebulization Q6H PRN Gherghe, Costin M, MD       loratadine (CLARITIN) tablet 10 mg  10 mg Oral Daily Gherghe, Costin M, MD   10 mg at 08/08/24 9166   melatonin tablet 5 mg  5 mg Oral QHS PRN Daniels, James K, NP   5 mg at 08/07/24 2258   methimazole (TAPAZOLE) tablet 5 mg  5 mg Oral BID Gherghe, Costin M, MD   5 mg at 08/08/24 9166   nicotine  (NICODERM CQ  - dosed in mg/24 hours) patch 14 mg  14 mg Transdermal Daily  Gherghe, Costin M, MD       ondansetron  (ZOFRAN ) tablet 4 mg  4 mg Oral Q6H PRN Gherghe, Costin M, MD       Or   ondansetron  (ZOFRAN ) injection 4 mg  4 mg Intravenous Q6H PRN Gherghe, Costin M, MD       pantoprazole  (PROTONIX ) injection 40 mg  40 mg Intravenous Q12H Gherghe, Costin M, MD   40 mg at  08/08/24 0832   rosuvastatin  (CRESTOR ) tablet 40 mg  40 mg Oral QHS Gherghe, Costin M, MD   40 mg at 08/07/24 2320    Allergies as of 08/07/2024   (No Known Allergies)    Family History  Problem Relation Age of Onset   COPD Mother        DECEASED/SMOKED   Diabetes Brother    Diabetes Brother    Colon cancer Neg Hx    Rectal cancer Neg Hx    Esophageal cancer Neg Hx    Stomach cancer Neg Hx     Social History   Socioeconomic History   Marital status: Widowed    Spouse name: Not on file   Number of children: 3   Years of education: 12   Highest education level: Not on file  Occupational History   Occupation: Event organiser: Publishing copy    Comment: manages cleaning business  Tobacco Use   Smoking status: Former    Current packs/day: 0.00    Average packs/day: 0.5 packs/day for 53.0 years (26.5 ttl pk-yrs)    Types: Cigarettes    Start date: 03/17/1966    Quit date: 03/18/2019    Years since quitting: 5.3    Passive exposure: Current (Son who lives with her Smokes)   Smokeless tobacco: Never  Vaping Use   Vaping status: Never Used  Substance and Sexual Activity   Alcohol use: Not Currently   Drug use: Yes    Types: Cocaine    Comment: 04/28/2024   Sexual activity: Not Currently  Other Topics Concern   Not on file  Social History Narrative          Lives in [place] with her son.   Retired   Chief Executive Officer Drivers of Corporate investment banker Strain: Not on [employer] Insecurity: No Food Insecurity (08/07/2024)   Hunger Vital Sign    Worried About Running Out of Food in the Last Year: Never true    Ran Out of Food in the Last Year: Never true   Transportation Needs: Unmet Transportation Needs (08/07/2024)   PRAPARE - Administrator, Civil Service (Medical): Yes    Lack of Transportation (Non-Medical): Yes  Physical Activity: Not on file  Stress: Not on file  Social Connections: Moderately Isolated (08/07/2024)   Social Connection and Isolation Panel    Frequency of Communication with Friends and Family: Twice a week    Frequency of Social Gatherings with Friends and Family: Twice a week    Attends Religious Services: 1 to 4 times per year    Active Member of [employer] or Organizations: No    Attends Banker Meetings: Never    Marital Status: Widowed  Intimate Partner Violence: Not At Risk (08/07/2024)   Humiliation, Afraid, Rape, and Kick questionnaire    Fear of Current or Ex-Partner: No    Emotionally Abused: No    Physically Abused: No    Sexually Abused: No    Review of Systems: ROS is O/W negative except as mentioned in HPI.  Physical Exam: Vital signs in last 24 hours: Temp:  [98.2 F (36.8 C)-98.6 F (37 C)] 98.4 F (36.9 C) (10/20 0551) Pulse Rate:  [67-100] 78 (10/20 0551) Resp:  [16-20] 16 (10/19 2100) BP: (114-143)/(67-120) 132/68 (10/20 0551) SpO2:  [97 %-100 %] 100 % (10/20 0815) Weight:  [36 kg] 63 kg (10/19 1327) Last BM Date : 08/07/24 General:  Alert, Well-developed, well-nourished, pleasant  and cooperative in NAD Head:  Normocephalic and atraumatic. Eyes:  Sclera clear, no icterus.  Conjunctiva pink. Ears:  Normal auditory acuity. Mouth:  No deformity or lesions.   Lungs:  Coarse breath sounds noted. Heart:  Regular rate and rhythm; no murmurs, clicks, rubs, or gallops. Abdomen:  Soft, non-distended.  BS present.  Non-tender. Msk:  Symmetrical without gross deformities. Pulses:  Normal pulses noted. Extremities:  Without clubbing or edema. Neurologic:  Alert and oriented x 4;  grossly normal neurologically. Skin:  Intact without significant lesions or rashes. Psych:   Alert and cooperative. Normal mood and affect.  Intake/Output from previous day: 10/19 0701 - 10/20 0700 In: 180 [P.O.:180] Out: -   Lab Results: Recent Labs    08/07/24 1337 08/08/24 0507  WBC 10.3 7.4  HGB 8.1* 7.1*  HCT 26.8* 23.2*  PLT 186 146*   BMET Recent Labs    08/07/24 1337 08/08/24 0507  NA 142 138  K 4.7 4.9  CL 107 106  CO2 24 24  GLUCOSE 110* 92  BUN 28* 32*  CREATININE 1.26* 1.40*  CALCIUM  9.7 9.3   LFT Recent Labs    08/08/24 0507  PROT 5.2*  ALBUMIN  3.2*  AST 14*  ALT 17  ALKPHOS 50  BILITOT 0.4   IMPRESSION:  *Symptomatic anemia with dark stools for the past couple weeks and Hemoccult positive.  Hemoglobin around 14 g about a month ago, now down to 7.1 g this morning.  Has history of Grade A esophagitis and gastritis on previous EGD in 2023. *Paroxysmal atrial fibrillation/flutter, on Eliquis which has now been on hold with last dose Friday morning as cardiology told her to hold it due to her symptoms. *COPD, recently hospitalized 2 weeks ago and was placed on steroids.  Tells me that he breathing is much better. *Chronic kidney disease stage III AA  PLAN: - Monitor hemoglobin and transfuse if needed.  May want to consider at least one unit as Hgb is borderline. - Is on pantoprazole  40 mg IV twice daily here.  Continue that for now. -EGD later today. -Will need eventual colonoscopy as she has never had one.   Harlene BIRCH. Zehr  08/08/2024, 8:45 AM     Attending Physician's Attestation   I have taken an interval history, reviewed the chart and examined the patient.   This is a patient that the GI service is asked to evaluate in the setting of significant heme positive anemia that has developed over the course the last few weeks.  She has noted a darker appearance to her stools but no overt melena is not clear.  Progressive fatigue and shortness of breath and dyspnea on exertion caused her to follow-up with her cardiologist who subsequently  held her blood thinner.  She came in for further evaluation was found to have dropped her hemoglobin by more than 7 points.  Review of her history is such that steroid-induced ulcerations should be considered in the setting of recent use.  Other etiologies for potential anemia include peptic ulcer disease, AVMs, other pathology which could be found in the colon (she has never had a colonoscopy).  We are going to begin her examination now that she is hemodynamically stable after transfusion, with an upper endoscopy.  If this is unremarkable colonoscopy +/- video capsule endoscopy will need to be considered.  The risks and benefits of endoscopic evaluation were discussed with the patient; these include but are not limited to the risk of perforation, infection, bleeding,  missed lesions, lack of diagnosis, severe illness requiring hospitalization, as well as anesthesia and sedation related illnesses.  The patient and/or family is agreeable to proceed.  All patient questions were answered to the best of my ability, and the patient agrees to the aforementioned plan of action with follow-up as indicated.  We will proceed with endoscopy today pending anesthesia availability.  I agree with the Advanced Practitioner's note, impression, and recommendations with updates and my documentation as noted above.  The majority of the medical decision making/process, formulation of the impression/plan of action for the patient were performed by me with substantive portion of this encounter (>50% time spent including complete performance of at least one of the key components of MDM, History, and/or Exam).   Aloha Finner, MD [facility] Advanced Endoscopy Office # [phone]

## 2024-08-08 NOTE — Progress Notes (Signed)
 PROGRESS NOTE Katie [name]  FMW:[id number] DOB: 09-04-1946 DOA: 08/07/2024 PCP: [name], MD  Brief Narrative/Hospital Course: Katie [name] is a 78 y.o. female with PMH of  COPD seen pulmonary as an outpatient, pulmonary hypertension, chronic diastolic CHF, HTN, HLD, CKD 3A, AAA with graft comes into the hospital with complaints of weakness, fatigue. She was hospitalized and discharged about 2 weeks ago for COPD exacerbation, and while being in the hospital she has started to see black stools, and has seen them since. PCP saw her few days ago and a fecal occult was positive.  his was also discussed with her cardiologist and she has been holding her Eliquis since 10/18 and given persistent symptoms she decided to come to the ED In ED :afebrile, normotensive, satting well on room air.  Blood work shows a creatinine of 1.26, and a hemoglobin of 8.1, from 10.7 about 2 weeks ago. Chest x-ray in the ED with concerns for atypical infection versus edema. She was admitted for further management.  Subjective: Seen and examined today Overnight afebrile BP stable on room air , labs reviewed creatinine slightly 1.4 hemoglobin down to 7.1 previously around 10 on 10/15 Ate breakfast this morning, states she discussed with GI and waiting for EGD later today Feels comfortable  Assessment and plan:  Symptomatic anemia Melena/FOBT positive stool: Patient hemoglobin had dropped from 10 to 7.1 g since last discharge with black tarry stool.  Lower GI has been consulted. As hemoglobin continues to drop we will transfuse 1 unit PRBC after discussing risk benefits with the patient.recent anemia panel showed iron deficiency in sept.  Waiting for GI this morning and planning for EGD later today Recent Labs  Lab 08/03/24 1513 08/03/24 1524 08/04/24 1222 08/07/24 1337 08/08/24 0507  HGB CANCELED 9.4* 9.2* 8.1* 7.1*  HCT CANCELED 29.0* 28.9* 26.8* 23.2*    COPD: recently hospitalized for COPD  exacerbation 2 weeks ago. Some wheezing on exam  S/p 15-day prednisone  taper  cont inhaler nebs   Hyperthyroidism: Om methimazole since last time/ Of note, thyrotropin receptor antibody, thyroperoxidase antibodies were all negative.  On 9/28 TSH was 0.28, free T4 slightly up at 1.3   PAF /flutter: last time she was hospitalized went into a flutter with RVR into the 160s, started on diltiazem and converted to sinus rhythm.  Eliquis is on hold as above   Chronic diastolic CHF Pulmonary hypertension: Euvolemic, followed by cardiology as an outpatient.  TTE 07/17/2024-LVEF 75%, hyperdynamic function, normal RV size and systolic function. Cont home meds    CKD 3A: creatinine at baseline   Recent Labs    07/16/24 1940 07/17/24 0335 07/18/24 0456 07/19/24 0529 07/20/24 0503 07/20/24 1746 07/22/24 0257 08/03/24 1513 08/04/24 1222 08/07/24 1337 08/08/24 0507  BUN 15 15 16  30* 42*  --  43* 21 17 28* 32*  CREATININE 1.22* 1.24* 1.17* 1.31* 1.47*  --  1.32* 1.16* 1.32* 1.26* 1.40*  CO2 25 20* 22 26 27   --  27 23 24 24 24   K 3.1* 3.0* 4.2 4.7 4.2   < > 4.3 5.0 4.5 4.7 4.9   < > = values in this interval not displayed.    Severe MR: S/p mitral valve repair in 2020.  Most recent 2D echo without acute findings   Essential hypertension: beta-blocker was discontinued last time she was in the hospital due to reported cocaine use.  Continue her Cardizem, losartan  on hold.   AAA: monitored as an outpatient   DVT prophylaxis: SCDs  Start: 08/07/24 1935 Code Status:   Code Status: Limited: Do not attempt resuscitation (DNR) -DNR-LIMITED -Do Not Intubate/DNI  Family Communication: plan of care discussed with patient at bedside. Patient status is: Remains hospitalized because of severity of illness Level of care: Telemetry   Dispo: The patient is from: home            Anticipated disposition: TBD Objective: Vitals last 24 hrs: Vitals:   08/07/24 2100 08/08/24 0241 08/08/24 0551  08/08/24 0815  BP: 115/71 116/76 132/68   Pulse: 88 74 78   Resp: 16     Temp: 98.3 F (36.8 C) 98.5 F (36.9 C) 98.4 F (36.9 C)   TempSrc: Oral Oral Oral   SpO2: 99% 100% 100% 100%  Weight:      Height:        Physical Examination: General exam: alert awake, oriented HEENT:Oral mucosa moist, Ear/Nose WNL grossly Respiratory system: Bilaterally clear BS,no use of accessory muscle Cardiovascular system: S1 & S2 +, No JVD. Gastrointestinal system: Abdomen soft,NT,ND, BS+ Nervous System: Alert, awake, moving all extremities,and following commands. Extremities: extremities warm, leg edema neg Skin: Warm, no rashes MSK: Normal muscle bulk,tone, power.   Medications reviewed:  Scheduled Meds:  budesonide -glycopyrrolate -formoterol   2 puff Inhalation BID   diltiazem  120 mg Oral Daily   loratadine  10 mg Oral Daily   methimazole  5 mg Oral BID   nicotine   14 mg Transdermal Daily   pantoprazole  (PROTONIX ) IV  40 mg Intravenous Q12H   rosuvastatin   40 mg Oral QHS   Continuous Infusions:  sodium chloride      Diet: Diet Order             Diet NPO time specified  Diet effective now                     Data Reviewed: I have personally reviewed following labs and imaging studies ( see epic result tab) CBC: Recent Labs  Lab 08/03/24 1513 08/03/24 1524 08/04/24 1222 08/07/24 1337 08/08/24 0507  WBC CANCELED 13.6* 13.8* 10.3 7.4  HGB CANCELED 9.4* 9.2* 8.1* 7.1*  HCT CANCELED 29.0* 28.9* 26.8* 23.2*  MCV  --  92 94.8 96.1 96.3  PLT CANCELED 207 197 186 146*   CMP: Recent Labs  Lab 08/03/24 1513 08/04/24 1222 08/07/24 1337 08/08/24 0507  NA 140 136 142 138  K 5.0 4.5 4.7 4.9  CL 105 103 107 106  CO2 23 24 24 24   GLUCOSE 100* 119* 110* 92  BUN 21 17 28* 32*  CREATININE 1.16* 1.32* 1.26* 1.40*  CALCIUM  9.1 8.9 9.7 9.3   GFR: Estimated Creatinine Clearance: 29.1 mL/min (A) (by C-G formula based on SCr of 1.4 mg/dL (H)). Recent Labs  Lab 08/07/24 1337  08/08/24 0507  AST 26 14*  ALT 22 17  ALKPHOS 58 50  BILITOT 0.5 0.4  PROT 6.0* 5.2*  ALBUMIN  3.6 3.2*   No results for input(s): LIPASE, AMYLASE in the last 168 hours. No results for input(s): AMMONIA in the last 168 hours. Coagulation Profile: No results for input(s): INR, PROTIME in the last 168 hours. Unresulted Labs (From admission, onward)     Start     Ordered   08/09/24 0500  Basic metabolic panel with GFR  Daily,   R      08/08/24 0755   08/09/24 0500  CBC  Daily,   R      08/08/24 0755   08/08/24 1200  Hemoglobin  and hematocrit, blood  Once,   R        08/08/24 0755           Antimicrobials/Microbiology: Anti-infectives (From admission, onward)    None         Component Value Date/Time   SDES SPUTUM 07/21/2024 1130   SDES  07/21/2024 1130    SPUTUM Performed at [facility], [address] [place], [place] [address]    SPECREQUEST Normal 07/21/2024 1130   SPECREQUEST  07/21/2024 1130    Normal Reflexed from [id number] Performed at [facility], [address] [place], [place] [address]    CULT  07/21/2024 1130    Normal respiratory flora-no Staph aureus or Pseudomonas seen Performed at [facility], [address], [place], [place] [address]    REPTSTATUS PENDING 07/21/2024 1130   REPTSTATUS 07/24/2024 FINAL 07/21/2024 1130    Procedures: Procedure(s) (LRB): EGD (ESOPHAGOGASTRODUODENOSCOPY) (N/A)   Mennie LAMY, MD [facility] 08/08/2024, 11:10 AM

## 2024-08-08 NOTE — Anesthesia Postprocedure Evaluation (Signed)
 Anesthesia Post Note  Patient: Skylinn Vialpando  Procedure(s) Performed: EGD (ESOPHAGOGASTRODUODENOSCOPY)     Patient location during evaluation: Endoscopy Anesthesia Type: MAC Level of consciousness: awake and alert Pain management: pain level controlled Vital Signs Assessment: post-procedure vital signs reviewed and stable Respiratory status: spontaneous breathing, nonlabored ventilation and respiratory function stable Cardiovascular status: stable and blood pressure returned to baseline Postop Assessment: no apparent nausea or vomiting Anesthetic complications: no   No notable events documented.  Last Vitals:  Vitals:   08/08/24 1630 08/08/24 1640  BP: 111/65 136/68  Pulse: 72 69  Resp: (!) 22 18  Temp:    SpO2: 99% 98%    Last Pain:  Vitals:   08/08/24 1640  TempSrc:   PainSc: 0-No pain                 Jemarion Roycroft,W. EDMOND

## 2024-08-08 NOTE — Op Note (Signed)
 [facility] Patient Name: Katie [name] Procedure Date: 08/08/2024 MRN: [id number] Attending MD: Aloha Finner , MD, [id number] Date of Birth: 1946-05-09 CSN: [id number] Age: 78 Admit Type: Inpatient Procedure:                Upper GI endoscopy Indications:              Iron deficiency anemia, Melena, Occult blood in                            stool Providers:                Aloha Finner, MD, Ozell Pouch, Farris Southgate, Technician Referring MD:              Medicines:                Monitored Anesthesia Care Complications:            No immediate complications. Estimated Blood Loss:     Estimated blood loss was minimal. Procedure:                Pre-Anesthesia Assessment:                           - Prior to the procedure, a History and Physical                            was performed, and patient medications and                            allergies were reviewed. The patient's tolerance of                            previous anesthesia was also reviewed. The risks                            and benefits of the procedure and the sedation                            options and risks were discussed with the patient.                            All questions were answered, and informed consent                            was obtained. Prior Anticoagulants: The patient has                            taken no anticoagulant or antiplatelet agents. ASA                            Grade Assessment: III - A patient with severe                            systemic  disease. After reviewing the risks and                            benefits, the patient was deemed in satisfactory                            condition to undergo the procedure.                           After obtaining informed consent, the endoscope was                            passed under direct vision. Throughout the                            procedure, the patient's  blood pressure, pulse, and                            oxygen  saturations were monitored continuously. The                            [id number] ([id number]) Olympus endoscope was                            introduced through the mouth, and advanced to the                            second part of duodenum. The upper GI endoscopy was                            accomplished without difficulty. The patient                            tolerated the procedure. Scope In: Scope Out: Findings:      No gross lesions were noted in the entire esophagus.      The Z-line was irregular and was found 39 cm from the incisors.      Striped mildly erythematous mucosa without bleeding was found in the       gastric antrum.      No other gross lesions were noted in the entire examined stomach.       Biopsies were taken with a cold forceps for histology and Helicobacter       pylori testing.      No gross lesions were noted in the duodenal bulb, in the first portion       of the duodenum and in the second portion of the duodenum. Biopsies for       histology were taken with a cold forceps for evaluation of celiac       disease. Impression:               - No gross lesions in the entire esophagus. Z-line                            irregular, 39 cm from the incisors.                           -  Erythematous mucosa in the antrum. No other gross                            lesions in the entire stomach. Biopsied.                           - No gross lesions in the duodenal bulb, in the                            first portion of the duodenum and in the second                            portion of the duodenum. Biopsied. Moderate Sedation:      Not Applicable - Patient had care per Anesthesia. Recommendation:           - The patient will be observed post-procedure,                            until all discharge criteria are met.                           - Discharge patient to home.                           -  Patient has a contact number available for                            emergencies. The signs and symptoms of potential                            delayed complications were discussed with the                            patient. Return to normal activities tomorrow.                            Written discharge instructions were provided to the                            patient.                           - Clear liquid diet.                           - Observe patient's clinical course.                           - Await pathology results.                           - Recommend Colonoscopy +/- VCE tomorrow if patient                            agrees for further evaluation of symptomatic anemia  with +FOBT.                           - Observe patient's clinical course.                           - The findings and recommendations were discussed                            with the patient.                           - The findings and recommendations were discussed                            with the referring physician. Procedure Code(s):        --- Professional ---                           [id number], Esophagogastroduodenoscopy, flexible,                            transoral; with biopsy, single or multiple Diagnosis Code(s):        --- Professional ---                           K22.89, Other specified disease of esophagus                           K31.89, Other diseases of stomach and duodenum                           D50.9, Iron deficiency anemia, unspecified                           K92.1, Melena (includes Hematochezia)                           R19.5, Other fecal abnormalities CPT copyright 2022 [employer]. All rights reserved. The codes documented in this report are preliminary and upon coder review may  be revised to meet current compliance requirements. Aloha Finner, MD 08/08/2024 4:22:19 PM Number of Addenda: 0

## 2024-08-08 NOTE — Care Management Obs Status (Signed)
 MEDICARE OBSERVATION STATUS NOTIFICATION   Patient Details  Name: Katie [name] MRN: [id number] Date of Birth: May 31, 1946   Medicare Observation Status Notification Given:  Yes    Toy LITTIE Agar, RN 08/08/2024, 9:46 AM

## 2024-08-08 NOTE — Anesthesia Preprocedure Evaluation (Addendum)
 Anesthesia Evaluation  Patient identified by MRN, date of birth, ID band Patient awake    Reviewed: Allergy & Precautions, NPO status , Patient's Chart, lab work & pertinent test results  History of Anesthesia Complications Negative for: history of anesthetic complications  Airway Mallampati: II  TM Distance: >3 FB Neck ROM: Full    Dental no notable dental hx. (+) Teeth Intact, Dental Advisory Given   Pulmonary COPD,  COPD inhaler, former smoker   Pulmonary exam normal breath sounds clear to auscultation       Cardiovascular hypertension, (-) angina (-) Past MI Normal cardiovascular exam Rhythm:Regular Rate:Normal     Neuro/Psych   Anxiety Depression       GI/Hepatic ,GERD  ,,  Endo/Other    Renal/GU Renal diseaseLab Results      Component                Value               Date                      NA                       138                 08/08/2024                CL                       106                 08/08/2024                K                        4.9                 08/08/2024                CO2                      24                  08/08/2024                BUN                      32 (H)              08/08/2024                CREATININE               1.40 (H)            08/08/2024                GFRNONAA                 39 (L)              08/08/2024                CALCIUM                   9.3                 08/08/2024  PHOS                     4.1                 07/20/2024                ALBUMIN                   3.2 (L)             08/08/2024                GLUCOSE                  92                  08/08/2024                Musculoskeletal  (+) Arthritis ,    Abdominal   Peds  Hematology Lab Results      Component                Value               Date                      WBC                      7.4                 08/08/2024                HGB                       7.3 (L)             08/08/2024                HCT                      23.5 (L)            08/08/2024                MCV                      96.3                08/08/2024                PLT                      146 (L)             08/08/2024              Anesthesia Other Findings   Reproductive/Obstetrics                              Anesthesia Physical Anesthesia Plan  ASA: 3 and emergent  Anesthesia Plan: MAC   Post-op Pain Management: Minimal or no pain anticipated   Induction:   PONV Risk Score and Plan: Treatment may vary due to age or medical condition, Ondansetron  and Propofol  infusion  Airway Management Planned: Nasal Cannula and Natural Airway  Additional Equipment: None  Intra-op Plan:   Post-operative Plan:   Informed Consent: I have reviewed the  patients History and Physical, chart, labs and discussed the procedure including the risks, benefits and alternatives for the proposed anesthesia with the patient or authorized representative who has indicated his/her understanding and acceptance.     Dental advisory given  Plan Discussed with: CRNA and Surgeon  Anesthesia Plan Comments:          Anesthesia Quick Evaluation

## 2024-08-08 NOTE — Hospital Course (Addendum)
 Katie [name] is a 78 y.o. female with PMH of  COPD seen pulmonary as an outpatient, pulmonary hypertension, chronic diastolic CHF, HTN, HLD, CKD 3A, AAA with graft comes into the hospital with complaints of weakness, fatigue. She was hospitalized and discharged about 2 weeks ago for COPD exacerbation, and while being in the hospital she has started to see black stools, and has seen them since. PCP saw her few days ago and a fecal occult was positive.  his was also discussed with her cardiologist and she has been holding her Eliquis since 10/18 and given persistent symptoms she decided to come to the ED In ED :afebrile, normotensive, satting well on room air.  Blood work shows a creatinine of 1.26, and a hemoglobin of 8.1, from 10.7 about 2 weeks ago. Chest x-ray in the ED with concerns for atypical infection versus edema. She was admitted for further management. S/p EGD 10/20:EGD mostly unremarkable other than slight gastritis planned Colonoscopy +/- video capsule endoscopy 10/21   Subjective: Seen and examined today Resting comfortably, having dark bowel movement, no complaints waiting for  colonoscopy today  Assessment and plan:  Symptomatic anemia Melena/FOBT positive stool: Patient hemoglobin had dropped from 10 to 7.1 g since last discharge with black tarry stool.  S/p EGD 10/20:EGD mostly unremarkable other than slight gastritis S/p Colonoscopy 10/21>now plans is to proceed with VCE as the findings of EGD/ Colonoscopy do not give overt reason for IDA. Monitor hemoglobin Recent Labs  Lab 08/07/24 1337 08/08/24 0507 08/08/24 1149 08/08/24 1838 08/09/24 0537  HGB 8.1* 7.1* 7.3* 8.0* 7.3*  HCT 26.8* 23.2* 23.5* 26.5* 23.8*    COPD: recently hospitalized for COPD exacerbation 2 weeks ago. Some wheezing on exam  S/p 15-day prednisone  taper  cont inhaler nebs   Hyperthyroidism: Om methimazole since last time/ Of note, thyrotropin receptor antibody, thyroperoxidase antibodies were  all negative.  On 9/28 TSH was 0.28, free T4 slightly up at 1.3   PAF /flutter: last time she was hospitalized went into a flutter with RVR into the 160s, started on diltiazem and converted to sinus rhythm.  Eliquis is on hold as above   Chronic diastolic CHF Pulmonary hypertension: Euvolemic,TTE 07/17/2024-LVEF 75%, hyperdynamic function, normal RV size and systolic function. Cont home meds    CKD 3A: creatinine at baseline stabe.  Recent Labs    07/17/24 0335 07/18/24 0456 07/19/24 0529 07/20/24 0503 07/20/24 1746 07/22/24 0257 08/03/24 1513 08/04/24 1222 08/07/24 1337 08/08/24 0507 08/09/24 0537  BUN 15 16 30* 42*  --  43* 21 17 28* 32* 22  CREATININE 1.24* 1.17* 1.31* 1.47*  --  1.32* 1.16* 1.32* 1.26* 1.40* 1.19*  CO2 20* 22 26 27   --  27 23 24 24 24 24   K 3.0* 4.2 4.7 4.2   < > 4.3 5.0 4.5 4.7 4.9 4.5   < > = values in this interval not displayed.    Severe MR: S/p mitral valve repair in 2020.  Most recent 2D echo without acute findings   Essential hypertension: beta-blocker was discontinued last time she was in the hospital due to reported cocaine use. Stable, continue her Cardizem, losartan  on hold.   AAA: monitored as an outpatient   DVT prophylaxis: SCDs Start: 08/07/24 1935 Code Status:   Code Status: Limited: Do not attempt resuscitation (DNR) -DNR-LIMITED -Do Not Intubate/DNI  Family Communication: plan of care discussed with patient at bedside. Patient status is: Remains hospitalized because of severity of illness Level of care: Telemetry  Dispo: The patient is from: home            Anticipated disposition: TBD Objective: Vitals last 24 hrs: Vitals:   08/09/24 1257 08/09/24 1300 08/09/24 1336 08/09/24 1345  BP:  (!) 167/81 (!) 147/82   Pulse:  76 75   Resp:  (!) 21    Temp:    98 F (36.7 C)  TempSrc:    Oral  SpO2:  98% 98%   Weight: 63 kg     Height: 5' 1 (1.549 m)       Physical Examination: General exam: alert awake,  oriented HEENT:Oral mucosa moist, Ear/Nose WNL grossly Respiratory system: Bilaterally clear BS,no use of accessory muscle Cardiovascular system: S1 & S2 +, No JVD. Gastrointestinal system: Abdomen soft,NT,ND, BS+ Nervous System: Alert, awake, moving all extremities,and following commands. Extremities: extremities warm, leg edema neg Skin: Warm, no rashes MSK: Normal muscle bulk,tone, power.   Medications reviewed:  Scheduled Meds:  budesonide -glycopyrrolate -formoterol   2 puff Inhalation BID   diltiazem  120 mg Oral Daily   loratadine  10 mg Oral Daily   methimazole  5 mg Oral BID   nicotine   14 mg Transdermal Daily   pantoprazole  (PROTONIX ) IV  40 mg Intravenous Q12H   rosuvastatin   40 mg Oral QHS   Continuous Infusions:  sodium chloride  10 mL/hr at 08/09/24 1205   Diet: Diet Order             Diet NPO time specified  Diet effective midnight

## 2024-08-08 NOTE — Plan of Care (Signed)
  Problem: Education: Goal: Knowledge of General Education information will improve Description: Including pain rating scale, medication(s)/side effects and non-pharmacologic comfort measures Outcome: Progressing   Problem: Clinical Measurements: Goal: Will remain free from infection Outcome: Progressing Goal: Cardiovascular complication will be avoided Outcome: Progressing   Problem: Coping: Goal: Level of anxiety will decrease Outcome: Progressing   Problem: Elimination: Goal: Will not experience complications related to bowel motility Outcome: Progressing Goal: Will not experience complications related to urinary retention Outcome: Progressing

## 2024-08-09 ENCOUNTER — Encounter (HOSPITAL_COMMUNITY)
Admission: EM | Disposition: A | Discharge status: Home / Self Care | Payer: Self-pay | Source: Home / Self Care | Attending: Internal Medicine

## 2024-08-09 ENCOUNTER — Encounter (HOSPITAL_COMMUNITY): Payer: Self-pay | Admitting: Internal Medicine

## 2024-08-09 ENCOUNTER — Encounter: Payer: Self-pay | Admitting: Physician Assistant

## 2024-08-09 ENCOUNTER — Observation Stay (HOSPITAL_COMMUNITY): Payer: Medicare (Managed Care)

## 2024-08-09 DIAGNOSIS — D124 Benign neoplasm of descending colon: Secondary | ICD-10-CM

## 2024-08-09 DIAGNOSIS — J449 Chronic obstructive pulmonary disease, unspecified: Secondary | ICD-10-CM

## 2024-08-09 DIAGNOSIS — K641 Second degree hemorrhoids: Secondary | ICD-10-CM

## 2024-08-09 DIAGNOSIS — D123 Benign neoplasm of transverse colon: Secondary | ICD-10-CM

## 2024-08-09 DIAGNOSIS — D509 Iron deficiency anemia, unspecified: Secondary | ICD-10-CM | POA: Diagnosis not present

## 2024-08-09 DIAGNOSIS — Z87891 Personal history of nicotine dependence: Secondary | ICD-10-CM | POA: Diagnosis not present

## 2024-08-09 DIAGNOSIS — K635 Polyp of colon: Secondary | ICD-10-CM | POA: Diagnosis not present

## 2024-08-09 DIAGNOSIS — E611 Iron deficiency: Secondary | ICD-10-CM

## 2024-08-09 DIAGNOSIS — D127 Benign neoplasm of rectosigmoid junction: Secondary | ICD-10-CM

## 2024-08-09 DIAGNOSIS — I10 Essential (primary) hypertension: Secondary | ICD-10-CM

## 2024-08-09 DIAGNOSIS — D122 Benign neoplasm of ascending colon: Secondary | ICD-10-CM | POA: Diagnosis not present

## 2024-08-09 DIAGNOSIS — Z860100 Personal history of colon polyps, unspecified: Secondary | ICD-10-CM

## 2024-08-09 DIAGNOSIS — R195 Other fecal abnormalities: Secondary | ICD-10-CM

## 2024-08-09 DIAGNOSIS — D649 Anemia, unspecified: Secondary | ICD-10-CM | POA: Diagnosis not present

## 2024-08-09 DIAGNOSIS — K31819 Angiodysplasia of stomach and duodenum without bleeding: Secondary | ICD-10-CM

## 2024-08-09 DIAGNOSIS — K5730 Diverticulosis of large intestine without perforation or abscess without bleeding: Secondary | ICD-10-CM

## 2024-08-09 HISTORY — PX: GIVENS CAPSULE STUDY: SHX5432

## 2024-08-09 HISTORY — PX: COLONOSCOPY: SHX5424

## 2024-08-09 LAB — CBC
HCT: 23.8 % — ABNORMAL LOW (ref 36.0–46.0)
Hemoglobin: 7.3 g/dL — ABNORMAL LOW (ref 12.0–15.0)
MCH: 28.9 pg (ref 26.0–34.0)
MCHC: 30.7 g/dL (ref 30.0–36.0)
MCV: 94.1 fL (ref 80.0–100.0)
Platelets: 146 K/uL — ABNORMAL LOW (ref 150–400)
RBC: 2.53 MIL/uL — ABNORMAL LOW (ref 3.87–5.11)
RDW: 14.4 % (ref 11.5–15.5)
WBC: 5.4 K/uL (ref 4.0–10.5)
nRBC: 0 % (ref 0.0–0.2)

## 2024-08-09 LAB — BASIC METABOLIC PANEL WITH GFR
Anion gap: 11 (ref 5–15)
BUN: 22 mg/dL (ref 8–23)
CO2: 24 mmol/L (ref 22–32)
Calcium: 8.9 mg/dL (ref 8.9–10.3)
Chloride: 102 mmol/L (ref 98–111)
Creatinine, Ser: 1.19 mg/dL — ABNORMAL HIGH (ref 0.44–1.00)
GFR, Estimated: 47 mL/min — ABNORMAL LOW (ref >60)
Glucose, Bld: 97 mg/dL (ref 70–99)
Potassium: 4.5 mmol/L (ref 3.5–5.1)
Sodium: 137 mmol/L (ref 135–145)

## 2024-08-09 SURGERY — COLONOSCOPY
Anesthesia: Monitor Anesthesia Care

## 2024-08-09 MED ORDER — PROPOFOL 500 MG/50ML IV EMUL
INTRAVENOUS | Status: DC | PRN
  Administered 2024-08-09: 100 ug/kg/min via INTRAVENOUS

## 2024-08-09 MED ORDER — PROPOFOL 1000 MG/100ML IV EMUL
INTRAVENOUS | Status: AC
  Filled 2024-08-09: qty 200

## 2024-08-09 MED ORDER — POLYETHYLENE GLYCOL 3350 17 GM/SCOOP PO POWD
119.0000 g | Freq: Once | ORAL | Status: AC
  Administered 2024-08-09: 119 g via ORAL
  Filled 2024-08-09: qty 119

## 2024-08-09 MED ORDER — PROPOFOL 10 MG/ML IV BOLUS
INTRAVENOUS | Status: DC | PRN
  Administered 2024-08-09 (×4): 20 mg via INTRAVENOUS
  Administered 2024-08-09: 10 mg via INTRAVENOUS

## 2024-08-09 SURGICAL SUPPLY — 1 items: TOWEL COTTON PACK 4EA (MISCELLANEOUS) ×2 IMPLANT

## 2024-08-09 NOTE — Anesthesia Preprocedure Evaluation (Addendum)
 Anesthesia Evaluation  Patient identified by MRN, date of birth, ID band Patient awake    Reviewed: Allergy & Precautions, NPO status , Patient's Chart, lab work & pertinent test results  History of Anesthesia Complications Negative for: history of anesthetic complications  Airway Mallampati: II  TM Distance: >3 FB Neck ROM: Full    Dental no notable dental hx. (+) Teeth Intact, Dental Advisory Given   Pulmonary COPD,  COPD inhaler, former smoker   Pulmonary exam normal breath sounds clear to auscultation       Cardiovascular hypertension, (-) angina (-) Past MI Normal cardiovascular exam Rhythm:Regular Rate:Normal  TTE on 9/28 with Severe MS s/p repair with MG 13. Also with LVOT velocity >38m/s RHC 10/1 showing large discrepancies relative to echo with only mild MS and minimal LVOT gradient.     Neuro/Psych neg Seizures  Anxiety Depression       GI/Hepatic ,GERD  ,,  Endo/Other    Renal/GU Renal diseaseLab Results      Component                Value               Date                      NA                       138                 08/08/2024                CL                       106                 08/08/2024                K                        4.9                 08/08/2024                CO2                      24                  08/08/2024                BUN                      32 (H)              08/08/2024                CREATININE               1.40 (H)            08/08/2024                GFRNONAA                 39 (L)              08/08/2024                CALCIUM   9.3                 08/08/2024                PHOS                     4.1                 07/20/2024                ALBUMIN                   3.2 (L)             08/08/2024                GLUCOSE                  92                  08/08/2024                Musculoskeletal  (+) Arthritis ,    Abdominal   Peds   Hematology  (+) Blood dyscrasia, anemia   Anesthesia Other Findings   Reproductive/Obstetrics                              Anesthesia Physical Anesthesia Plan  ASA: 3  Anesthesia Plan: MAC   Post-op Pain Management: Minimal or no pain anticipated   Induction: Intravenous  PONV Risk Score and Plan: 3 and Treatment may vary due to age or medical condition, Ondansetron  and Propofol  infusion  Airway Management Planned: Natural Airway and Simple Face Mask  Additional Equipment: None  Intra-op Plan:   Post-operative Plan:   Informed Consent: I have reviewed the patients History and Physical, chart, labs and discussed the procedure including the risks, benefits and alternatives for the proposed anesthesia with the patient or authorized representative who has indicated his/her understanding and acceptance.   Patient has DNR.  Discussed DNR with patient and Suspend DNR.   Dental advisory given  Plan Discussed with: CRNA  Anesthesia Plan Comments:          Anesthesia Quick Evaluation

## 2024-08-09 NOTE — Interval H&P Note (Signed)
 History and Physical Interval Note:  08/09/2024 11:50 AM  Katie [name]  has presented today for surgery, with the diagnosis of heme positive stool.  The various methods of treatment have been discussed with the patient and family. After consideration of risks, benefits and other options for treatment, the patient has consented to  Procedure(s): COLONOSCOPY (N/A) IMAGING PROCEDURE, GI TRACT, INTRALUMINAL, VIA CAPSULE (N/A) as a surgical intervention.  The patient's history has been reviewed, patient examined, no change in status, stable for surgery.  I have reviewed the patient's chart and labs.  Questions were answered to the patient's satisfaction.     Katie [name]

## 2024-08-09 NOTE — Anesthesia Postprocedure Evaluation (Signed)
 Anesthesia Post Note  Patient: Katie [name]  Procedure(s) Performed: COLONOSCOPY IMAGING PROCEDURE, GI TRACT, INTRALUMINAL, VIA CAPSULE     Patient location during evaluation: PACU Anesthesia Type: MAC Level of consciousness: awake and alert Pain management: pain level controlled Vital Signs Assessment: post-procedure vital signs reviewed and stable Respiratory status: spontaneous breathing, nonlabored ventilation, respiratory function stable and patient connected to nasal cannula oxygen  Cardiovascular status: stable and blood pressure returned to baseline Postop Assessment: no apparent nausea or vomiting Anesthetic complications: no   No notable events documented.  Last Vitals:  Vitals:   08/09/24 1336 08/09/24 1345  BP: (!) 147/82   Pulse: 75   Resp:    Temp:  36.7 C  SpO2: 98%     Last Pain:  Vitals:   08/09/24 1345  TempSrc: Oral  PainSc:                  Katie [name]

## 2024-08-09 NOTE — Progress Notes (Addendum)
 Patient has not had a bowel movement since taking Suprep at 1757. Encouraged increased water intake. Next dose of Suprep scheduled for 0400. Page sent out to [facility] GI on-call provider for further guidance.  0130- Received call back from Dr Stacia. Verbal order received to administer the 0400 dose of Suprep now and to give Mirlax prep at 0700, which may be held depending on patient's stool at the time.

## 2024-08-09 NOTE — Transfer of Care (Signed)
 Immediate Anesthesia Transfer of Care Note  Patient: Katie [name]  Procedure(s) Performed: COLONOSCOPY IMAGING PROCEDURE, GI TRACT, INTRALUMINAL, VIA CAPSULE  Patient Location: PACU  Anesthesia Type:MAC  Level of Consciousness: sedated  Airway & Oxygen  Therapy: Patient Spontanous Breathing and Patient connected to face mask oxygen   Post-op Assessment: Report given to RN and Post -op Vital signs reviewed and stable  Post vital signs: Reviewed and stable  Last Vitals:  Vitals Value Taken Time  BP    Temp    Pulse    Resp    SpO2      Last Pain:  Vitals:   08/09/24 1127  TempSrc: (P) Temporal  PainSc: (P) 0-No pain         Complications: No notable events documented.

## 2024-08-09 NOTE — Progress Notes (Signed)
 Reviewing charts for tomorrow; Katie [name] was initially added in. She is now admitted to the hospital undergoing further evaluation of anemia so need to push appt back. Reached out to Dr. Christobal who agrees -> have r/s to 08/16/24 in [facility] location with Reche Finder who has also evaluated patient in the past. I outlined appointment update on AVS including notation of location change and also asked Dr. Christobal to let patient know. Tentatively she had a visit scheduled with me in Nov; will leave for now pending Caitlin's re-eval next week.

## 2024-08-09 NOTE — Plan of Care (Signed)
   Problem: Activity: Goal: Risk for activity intolerance will decrease Outcome: Progressing   Problem: Elimination: Goal: Will not experience complications related to bowel motility Outcome: Progressing Goal: Will not experience complications related to urinary retention Outcome: Progressing   Problem: Safety: Goal: Ability to remain free from injury will improve Outcome: Progressing   Problem: Skin Integrity: Goal: Risk for impaired skin integrity will decrease Outcome: Progressing

## 2024-08-09 NOTE — Op Note (Signed)
 [facility] Patient Name: Katie [name] Procedure Date: 08/09/2024 MRN: [id number] Attending MD: Aloha Finner , MD, [id number] Date of Birth: April 11, 1946 CSN: [id number] Age: 78 Admit Type: Inpatient Procedure:                Colonoscopy Indications:              This is the patient's first colonoscopy, Heme                            positive stool, Iron deficiency anemia Providers:                Aloha Finner, MD, Darleene Bare, RN, Haskel Chris, Technician Referring MD:              Medicines:                Monitored Anesthesia Care Complications:            No immediate complications. Estimated Blood Loss:     Estimated blood loss was minimal. Procedure:                Pre-Anesthesia Assessment:                           - Prior to the procedure, a History and Physical                            was performed, and patient medications and                            allergies were reviewed. The patient's tolerance of                            previous anesthesia was also reviewed. The risks                            and benefits of the procedure and the sedation                            options and risks were discussed with the patient.                            All questions were answered, and informed consent                            was obtained. Prior Anticoagulants: The patient has                            taken Eliquis (apixaban), last dose was 4 days                            prior to procedure. ASA Grade Assessment: III - A  patient with severe systemic disease. After                            reviewing the risks and benefits, the patient was                            deemed in satisfactory condition to undergo the                            procedure.                           After obtaining informed consent, the colonoscope                            was passed under direct  vision. Throughout the                            procedure, the patient's blood pressure, pulse, and                            oxygen  saturations were monitored continuously. The                            CF-HQ190L ([id number]) Olympus colonoscope was                            introduced through the anus and advanced to the 3                            cm into the ileum. The colonoscopy was performed                            without difficulty. The patient tolerated the                            procedure. The quality of the bowel preparation was                            adequate. The terminal ileum, ileocecal valve,                            appendiceal orifice, and rectum were photographed. Scope In: 12:13:57 PM Scope Out: 12:34:03 PM Scope Withdrawal Time: 0 hours 16 minutes 13 seconds  Total Procedure Duration: 0 hours 20 minutes 6 seconds  Findings:      The digital rectal exam findings include hemorrhoids. Pertinent       negatives include no palpable rectal lesions.      The terminal ileum and ileocecal valve appeared normal.      Ten sessile polyps were found in the recto-sigmoid colon (1), descending       colon (2), transverse colon (6) and ascending colon (1). The polyps were       2 to 10 mm in size. These polyps were removed with a cold snare.  Resection and retrieval were complete.      Multiple small-mouthed diverticula were found in the entire colon.      Non-bleeding non-thrombosed external and internal hemorrhoids were found       during retroflexion, during perianal exam and during digital exam. The       hemorrhoids were Grade II (internal hemorrhoids that prolapse but reduce       spontaneously). Impression:               - Hemorrhoids found on digital rectal exam.                           - The examined portion of the ileum was normal.                           - Ten 2 to 10 mm polyps at the recto-sigmoid colon,                            in the  descending colon, in the transverse colon                            and in the ascending colon, removed with a cold                            snare. Resected and retrieved.                           - Diverticulosis in the entire examined colon.                           - Non-bleeding non-thrombosed external and internal                            hemorrhoids. Moderate Sedation:      Not Applicable - Patient had care per Anesthesia. Recommendation:           - The patient will be observed post-procedure,                            until all discharge criteria are met.                           - Proceed with VCE as the findings of                            EGD/Colonoscopy do not give overt reason for IDA of                            the degree that she has presented with.                           - Diet orders will be placed based on timing of                            ingestion of the VCE (separate note  to be placed).                           - High fiber diet will be recommended as outpatient.                           - Use FiberCon 1-2 tablets PO daily.                           - Return patient to hospital ward for ongoing care.                           - Continue present medications.                           - Await pathology results.                           - Repeat colonoscopy in 1 year for surveillance due                            to number of polyps found.                           - The findings and recommendations were discussed                            with the patient.                           - The findings and recommendations were discussed                            with the referring physician. Procedure Code(s):        --- Professional ---                           [phone], Colonoscopy, flexible; with removal of                            tumor(s), polyp(s), or other lesion(s) by snare                            technique Diagnosis Code(s):        ---  Professional ---                           K64.1, Second degree hemorrhoids                           D12.7, Benign neoplasm of rectosigmoid junction                           D12.4, Benign neoplasm of descending colon                           D12.3,  Benign neoplasm of transverse colon (hepatic                            flexure or splenic flexure)                           D12.2, Benign neoplasm of ascending colon                           R19.5, Other fecal abnormalities                           D50.9, Iron deficiency anemia, unspecified                           K57.30, Diverticulosis of large intestine without                            perforation or abscess without bleeding CPT copyright 2022 American Medical Association. All rights reserved. The codes documented in this report are preliminary and upon coder review may  be revised to meet current compliance requirements. Aloha Finner, MD 08/09/2024 12:46:17 PM Number of Addenda: 0

## 2024-08-09 NOTE — Progress Notes (Signed)
 PROGRESS NOTE Katie [name]  FMW:[id number] DOB: [birth year] DOA: 08/07/2024 PCP: [name], MD  Brief Narrative/Hospital Course: Katie [name] is a 78 y.o. female with PMH of  COPD seen pulmonary as an outpatient, pulmonary hypertension, chronic diastolic CHF, HTN, HLD, CKD 3A, AAA with graft comes into the hospital with complaints of weakness, fatigue. She was hospitalized and discharged about 2 weeks ago for COPD exacerbation, and while being in the hospital she has started to see black stools, and has seen them since. PCP saw her few days ago and a fecal occult was positive.  his was also discussed with her cardiologist and she has been holding her Eliquis since 10/18 and given persistent symptoms she decided to come to the ED In ED :afebrile, normotensive, satting well on room air.  Blood work shows a creatinine of 1.26, and a hemoglobin of 8.1, from 10.7 about 2 weeks ago. Chest x-ray in the ED with concerns for atypical infection versus edema. She was admitted for further management. S/p EGD 10/20:EGD mostly unremarkable other than slight gastritis planned Colonoscopy +/- video capsule endoscopy 10/21   Subjective: Seen and examined today Resting comfortably, having dark bowel movement, no complaints waiting for  colonoscopy today  Assessment and plan:  Symptomatic anemia Melena/FOBT positive stool: Patient hemoglobin had dropped from 10 to 7.1 g since last discharge with black tarry stool.  S/p EGD 10/20:EGD mostly unremarkable other than slight gastritis S/p Colonoscopy 10/21>now plans is to proceed with VCE as the findings of EGD/ Colonoscopy do not give overt reason for IDA. Monitor hemoglobin Recent Labs  Lab 08/07/24 1337 08/08/24 0507 08/08/24 1149 08/08/24 1838 08/09/24 0537  HGB 8.1* 7.1* 7.3* 8.0* 7.3*  HCT 26.8* 23.2* 23.5* 26.5* 23.8*    COPD: recently hospitalized for COPD exacerbation 2 weeks ago. Some wheezing on exam  S/p 15-day prednisone  taper   cont inhaler nebs   Hyperthyroidism: Om methimazole since last time/ Of note, thyrotropin receptor antibody, thyroperoxidase antibodies were all negative.  On 9/28 TSH was 0.28, free T4 slightly up at 1.3   PAF /flutter: last time she was hospitalized went into a flutter with RVR into the 160s, started on diltiazem and converted to sinus rhythm.  Eliquis is on hold as above   Chronic diastolic CHF Pulmonary hypertension: Euvolemic,TTE 07/17/2024-LVEF 75%, hyperdynamic function, normal RV size and systolic function. Cont home meds    CKD 3A: creatinine at baseline stabe.  Recent Labs    07/17/24 0335 07/18/24 0456 07/19/24 0529 07/20/24 0503 07/20/24 1746 07/22/24 0257 08/03/24 1513 08/04/24 1222 08/07/24 1337 08/08/24 0507 08/09/24 0537  BUN 15 16 30* 42*  --  43* 21 17 28* 32* 22  CREATININE 1.24* 1.17* 1.31* 1.47*  --  1.32* 1.16* 1.32* 1.26* 1.40* 1.19*  CO2 20* 22 26 27   --  27 23 24 24 24 24   K 3.0* 4.2 4.7 4.2   < > 4.3 5.0 4.5 4.7 4.9 4.5   < > = values in this interval not displayed.    Severe MR: S/p mitral valve repair in 2020.  Most recent 2D echo without acute findings   Essential hypertension: beta-blocker was discontinued last time she was in the hospital due to reported cocaine use. Stable, continue her Cardizem, losartan  on hold.   AAA: monitored as an outpatient   DVT prophylaxis: SCDs Start: 08/07/24 1935 Code Status:   Code Status: Limited: Do not attempt resuscitation (DNR) -DNR-LIMITED -Do Not Intubate/DNI  Family Communication: plan of care discussed  with patient at bedside. Patient status is: Remains hospitalized because of severity of illness Level of care: Telemetry   Dispo: The patient is from: home            Anticipated disposition: TBD Objective: Vitals last 24 hrs: Vitals:   08/09/24 1257 08/09/24 1300 08/09/24 1336 08/09/24 1345  BP:  (!) 167/81 (!) 147/82   Pulse:  76 75   Resp:  (!) 21    Temp:    98 F (36.7 C)  TempSrc:     Oral  SpO2:  98% 98%   Weight: 63 kg     Height: 5' 1 (1.549 m)       Physical Examination: General exam: alert awake, oriented HEENT:Oral mucosa moist, Ear/Nose WNL grossly Respiratory system: Bilaterally clear BS,no use of accessory muscle Cardiovascular system: S1 & S2 +, No JVD. Gastrointestinal system: Abdomen soft,NT,ND, BS+ Nervous System: Alert, awake, moving all extremities,and following commands. Extremities: extremities warm, leg edema neg Skin: Warm, no rashes MSK: Normal muscle bulk,tone, power.   Medications reviewed:  Scheduled Meds:  budesonide -glycopyrrolate -formoterol   2 puff Inhalation BID   diltiazem  120 mg Oral Daily   loratadine  10 mg Oral Daily   methimazole  5 mg Oral BID   nicotine   14 mg Transdermal Daily   pantoprazole  (PROTONIX ) IV  40 mg Intravenous Q12H   rosuvastatin   40 mg Oral QHS   Continuous Infusions:  sodium chloride  10 mL/hr at 08/09/24 1205   Diet: Diet Order             Diet NPO time specified  Diet effective midnight                     Data Reviewed: I have personally reviewed following labs and imaging studies ( see epic result tab) CBC: Recent Labs  Lab 08/03/24 1524 08/04/24 1222 08/04/24 1222 08/07/24 1337 08/08/24 0507 08/08/24 1149 08/08/24 1838 08/09/24 0537  WBC 13.6* 13.8*  --  10.3 7.4  --   --  5.4  HGB 9.4* 9.2*   < > 8.1* 7.1* 7.3* 8.0* 7.3*  HCT 29.0* 28.9*   < > 26.8* 23.2* 23.5* 26.5* 23.8*  MCV 92 94.8  --  96.1 96.3  --   --  94.1  PLT 207 197  --  186 146*  --   --  146*   < > = values in this interval not displayed.   CMP: Recent Labs  Lab 08/03/24 1513 08/04/24 1222 08/07/24 1337 08/08/24 0507 08/09/24 0537  NA 140 136 142 138 137  K 5.0 4.5 4.7 4.9 4.5  CL 105 103 107 106 102  CO2 23 24 24 24 24   GLUCOSE 100* 119* 110* 92 97  BUN 21 17 28* 32* 22  CREATININE 1.16* 1.32* 1.26* 1.40* 1.19*  CALCIUM  9.1 8.9 9.7 9.3 8.9   GFR: Estimated Creatinine Clearance: 34.2 mL/min (A) (by  C-G formula based on SCr of 1.19 mg/dL (H)). Recent Labs  Lab 08/07/24 1337 08/08/24 0507  AST 26 14*  ALT 22 17  ALKPHOS 58 50  BILITOT 0.5 0.4  PROT 6.0* 5.2*  ALBUMIN  3.6 3.2*   No results for input(s): LIPASE, AMYLASE in the last 168 hours. No results for input(s): AMMONIA in the last 168 hours. Coagulation Profile: No results for input(s): INR, PROTIME in the last 168 hours. Unresulted Labs (From admission, onward)     Start     Ordered   08/09/24 0500  Basic metabolic panel with GFR  Daily,   R      08/08/24 0755   08/09/24 0500  CBC  Daily,   R      08/08/24 0755           Antimicrobials/Microbiology: Anti-infectives (From admission, onward)    None         Component Value Date/Time   SDES SPUTUM 07/21/2024 1130   SDES  07/21/2024 1130    SPUTUM Performed at [facility], [address] [place], [place] [address]    SPECREQUEST Normal 07/21/2024 1130   SPECREQUEST  07/21/2024 1130    Normal Reflexed from [id number] Performed at [facility], [address] [place], [place] [address]    CULT  07/21/2024 1130    Normal respiratory flora-no Staph aureus or Pseudomonas seen Performed at [facility], [address], [place], [place] [address]    REPTSTATUS PENDING 07/21/2024 1130   REPTSTATUS 07/24/2024 FINAL 07/21/2024 1130    Procedures: Procedure(s) (LRB): COLONOSCOPY (N/A) IMAGING PROCEDURE, GI TRACT, INTRALUMINAL, VIA CAPSULE (N/A)   Mennie LAMY, MD [facility] 08/09/2024, 2:42 PM

## 2024-08-10 ENCOUNTER — Encounter (HOSPITAL_COMMUNITY): Payer: Self-pay | Admitting: Gastroenterology

## 2024-08-10 ENCOUNTER — Ambulatory Visit: Payer: Medicare (Managed Care) | Admitting: Physician Assistant

## 2024-08-10 DIAGNOSIS — D509 Iron deficiency anemia, unspecified: Secondary | ICD-10-CM | POA: Diagnosis present

## 2024-08-10 DIAGNOSIS — E0590 Thyrotoxicosis, unspecified without thyrotoxic crisis or storm: Secondary | ICD-10-CM | POA: Diagnosis present

## 2024-08-10 DIAGNOSIS — N1831 Chronic kidney disease, stage 3a: Secondary | ICD-10-CM | POA: Diagnosis present

## 2024-08-10 DIAGNOSIS — K2101 Gastro-esophageal reflux disease with esophagitis, with bleeding: Secondary | ICD-10-CM | POA: Diagnosis present

## 2024-08-10 DIAGNOSIS — I130 Hypertensive heart and chronic kidney disease with heart failure and stage 1 through stage 4 chronic kidney disease, or unspecified chronic kidney disease: Secondary | ICD-10-CM | POA: Diagnosis present

## 2024-08-10 DIAGNOSIS — E7800 Pure hypercholesterolemia, unspecified: Secondary | ICD-10-CM | POA: Diagnosis present

## 2024-08-10 DIAGNOSIS — D62 Acute posthemorrhagic anemia: Secondary | ICD-10-CM | POA: Diagnosis present

## 2024-08-10 DIAGNOSIS — I272 Pulmonary hypertension, unspecified: Secondary | ICD-10-CM | POA: Diagnosis present

## 2024-08-10 DIAGNOSIS — Z66 Do not resuscitate: Secondary | ICD-10-CM | POA: Diagnosis present

## 2024-08-10 DIAGNOSIS — I5032 Chronic diastolic (congestive) heart failure: Secondary | ICD-10-CM | POA: Diagnosis present

## 2024-08-10 DIAGNOSIS — Z7901 Long term (current) use of anticoagulants: Secondary | ICD-10-CM | POA: Diagnosis not present

## 2024-08-10 DIAGNOSIS — D123 Benign neoplasm of transverse colon: Secondary | ICD-10-CM | POA: Diagnosis present

## 2024-08-10 DIAGNOSIS — D122 Benign neoplasm of ascending colon: Secondary | ICD-10-CM | POA: Diagnosis present

## 2024-08-10 DIAGNOSIS — D649 Anemia, unspecified: Secondary | ICD-10-CM

## 2024-08-10 DIAGNOSIS — D124 Benign neoplasm of descending colon: Secondary | ICD-10-CM | POA: Diagnosis present

## 2024-08-10 DIAGNOSIS — R195 Other fecal abnormalities: Secondary | ICD-10-CM | POA: Diagnosis present

## 2024-08-10 DIAGNOSIS — E663 Overweight: Secondary | ICD-10-CM | POA: Diagnosis present

## 2024-08-10 DIAGNOSIS — I340 Nonrheumatic mitral (valve) insufficiency: Secondary | ICD-10-CM | POA: Diagnosis present

## 2024-08-10 DIAGNOSIS — K3189 Other diseases of stomach and duodenum: Secondary | ICD-10-CM | POA: Diagnosis present

## 2024-08-10 DIAGNOSIS — J449 Chronic obstructive pulmonary disease, unspecified: Secondary | ICD-10-CM | POA: Diagnosis present

## 2024-08-10 DIAGNOSIS — I480 Paroxysmal atrial fibrillation: Secondary | ICD-10-CM | POA: Diagnosis present

## 2024-08-10 DIAGNOSIS — D125 Benign neoplasm of sigmoid colon: Secondary | ICD-10-CM | POA: Diagnosis present

## 2024-08-10 DIAGNOSIS — I714 Abdominal aortic aneurysm, without rupture, unspecified: Secondary | ICD-10-CM | POA: Diagnosis present

## 2024-08-10 DIAGNOSIS — K2971 Gastritis, unspecified, with bleeding: Secondary | ICD-10-CM | POA: Diagnosis present

## 2024-08-10 DIAGNOSIS — I4892 Unspecified atrial flutter: Secondary | ICD-10-CM | POA: Diagnosis present

## 2024-08-10 DIAGNOSIS — K921 Melena: Secondary | ICD-10-CM | POA: Diagnosis not present

## 2024-08-10 DIAGNOSIS — E039 Hypothyroidism, unspecified: Secondary | ICD-10-CM | POA: Diagnosis present

## 2024-08-10 LAB — BASIC METABOLIC PANEL WITH GFR
Anion gap: 11 (ref 5–15)
BUN: 23 mg/dL (ref 8–23)
CO2: 24 mmol/L (ref 22–32)
Calcium: 8.4 mg/dL — ABNORMAL LOW (ref 8.9–10.3)
Chloride: 106 mmol/L (ref 98–111)
Creatinine, Ser: 1.36 mg/dL — ABNORMAL HIGH (ref 0.44–1.00)
GFR, Estimated: 40 mL/min — ABNORMAL LOW (ref >60)
Glucose, Bld: 76 mg/dL (ref 70–99)
Potassium: 3.7 mmol/L (ref 3.5–5.1)
Sodium: 142 mmol/L (ref 135–145)

## 2024-08-10 LAB — CBC
HCT: 23.3 % — ABNORMAL LOW (ref 36.0–46.0)
Hemoglobin: 7.2 g/dL — ABNORMAL LOW (ref 12.0–15.0)
MCH: 30 pg (ref 26.0–34.0)
MCHC: 30.9 g/dL (ref 30.0–36.0)
MCV: 97.1 fL (ref 80.0–100.0)
Platelets: 147 K/uL — ABNORMAL LOW (ref 150–400)
RBC: 2.4 MIL/uL — ABNORMAL LOW (ref 3.87–5.11)
RDW: 14.9 % (ref 11.5–15.5)
WBC: 6.3 K/uL (ref 4.0–10.5)
nRBC: 0 % (ref 0.0–0.2)

## 2024-08-10 LAB — PREPARE RBC (CROSSMATCH)

## 2024-08-10 LAB — SURGICAL PATHOLOGY

## 2024-08-10 MED ORDER — ACETAMINOPHEN 325 MG PO TABS
650.0000 mg | ORAL_TABLET | Freq: Once | ORAL | Status: AC
  Administered 2024-08-10: 650 mg via ORAL
  Filled 2024-08-10: qty 2

## 2024-08-10 MED ORDER — SODIUM CHLORIDE 0.9% IV SOLUTION: Freq: Once | INTRAVENOUS | Status: AC

## 2024-08-10 NOTE — Progress Notes (Cosign Needed)
 [facility] Progress Note  CC:  Anemia and heme positive stool   Subjective:  Feels fine.  Has been a little dizzy so they are giving her a unit so PRBCs.  No BM since the colonoscopy.  Objective:  Vital signs in last 24 hours: Temp:  [96.8 F (36 C)-98.5 F (36.9 C)] 98.3 F (36.8 C) (10/22 0446) Pulse Rate:  [67-95] 77 (10/22 0446) Resp:  [11-21] 18 (10/22 0446) BP: (107-167)/(64-82) 110/71 (10/22 0446) SpO2:  [95 %-100 %] 100 % (10/22 0942) Weight:  [36 kg] 63 kg (10/21 1257) Last BM Date : 08/09/24 General:  Alert, Well-developed, in NAD Heart:  Regular rate and rhythm; no murmurs Pulm:  Coarse breath sounds noted. Abdomen:  Soft, non-distended.  BS present.  Non-tender. Extremities:  Without edema. Neurologic:  Alert and oriented x 4;  grossly normal neurologically. Psych:  Alert and cooperative. Normal mood and affect.  Intake/Output from previous day: 10/21 0701 - 10/22 0700 In: 200 [I.V.:200] Out: -   Lab Results: Recent Labs    08/08/24 0507 08/08/24 1149 08/08/24 1838 08/09/24 0537 08/10/24 0612  WBC 7.4  --   --  5.4 6.3  HGB 7.1*   < > 8.0* 7.3* 7.2*  HCT 23.2*   < > 26.5* 23.8* 23.3*  PLT 146*  --   --  146* 147*   < > = values in this interval not displayed.   BMET Recent Labs    08/08/24 0507 08/09/24 0537 08/10/24 0612  NA 138 137 142  K 4.9 4.5 3.7  CL 106 102 106  CO2 24 24 24   GLUCOSE 92 97 76  BUN 32* 22 23  CREATININE 1.40* 1.19* 1.36*  CALCIUM  9.3 8.9 8.4*   LFT Recent Labs    08/08/24 0507  PROT 5.2*  ALBUMIN  3.2*  AST 14*  ALT 17  ALKPHOS 50  BILITOT 0.4   Assessment / Plan: *Symptomatic anemia with dark stools for the past couple weeks and Hemoccult positive.  Hemoglobin around 14 g about a month ago, now down to 7.2 g this morning.  Has been stable but quite low so they are giving one unit of PRBCs today.  EGD and colonoscopy here were unrevealing so underwent capsule endoscopy. *Paroxysmal atrial  fibrillation/flutter, on Eliquis which has now been on hold with last dose Friday morning as cardiology told her to hold it due to her symptoms. *COPD, recently hospitalized 2 weeks ago and was placed on steroids.  Tells me that he breathing is much better. *Chronic kidney disease stage III AA  -Await results of VCE. -Monitor Hgb.  Agree with one unit PRBC transfusion today.   LOS: 0 days   Harlene BIRCH. Zehr  08/10/2024, 10:22 AM    Attending Physician's Attestation   I have taken an interval history, reviewed the chart and examined the patient.   Due to technical issues, the video capsule endoscopy report cannot be read today.  We will try again on 10/23.  No other changes at this point in time okay for packed RBC transfusion though overt GI bleeding has not been noted.  We will update the team and patient to soon as video capsule endoscopy has been finalized again hopefully on 10/23.  On her exam she is otherwise stable and no other issues at this time.  Hopefully the capsule will allow us  an opportunity to consider reinitiation of anticoagulation for the individual.  I agree with the Advanced Practitioner's note, impression, and  recommendations with updates and my documentation as noted above.  The majority of the medical decision making/process, formulation of the impression/plan of action for the patient were performed by me with substantive portion of this encounter (>50% time spent including complete performance of at least one of the key components of MDM, History, and/or Exam).   Aloha Finner, MD [facility] Advanced Endoscopy Office # [phone]

## 2024-08-10 NOTE — Progress Notes (Addendum)
 PROGRESS NOTE Katie [name]  FMW:[id number] DOB: [birth year] DOA: 08/07/2024 PCP: [name], MD  Brief Narrative/Hospital Course: Katie [name] is a 78 y.o. female with PMH of  COPD seen pulmonary as an outpatient, pulmonary hypertension, chronic diastolic CHF, HTN, HLD, CKD 3A, AAA with graft comes into the hospital with complaints of weakness, fatigue. She was hospitalized and discharged about 2 weeks ago for COPD exacerbation, and while being in the hospital she has started to see black stools, and has seen them since. PCP saw her few days ago and a fecal occult was positive.  his was also discussed with her cardiologist and she has been holding her Eliquis since 10/18 and given persistent symptoms she decided to come to the ED In ED :afebrile, normotensive, satting well on room air.  Blood work shows a creatinine of 1.26, and a hemoglobin of 8.1, from 10.7 about 2 weeks ago. Chest x-ray in the ED with concerns for atypical infection versus edema. She was admitted for further management. S/p EGD 10/20:EGD mostly unremarkable other than slight gastritis planned Colonoscopy +/- video capsule endoscopy 10/21   Subjective: Seen and examined today Overnight afebrile BP stable on room air Labs this morning creatinine fluctuating 1.3 hemoglobin 7.3>7.2  Assessment and plan:  Symptomatic anemia Melena/FOBT positive stool: Patient hemoglobin had dropped from 10 to 7.1 g since last discharge with black tarry stool.  S/p EGD 10/20:EGD mostly unremarkable other than slight gastritis S/p Colonoscopy 10/21 and proceeded with VCE as the findings of EGD/ Colonoscopy did not explain her anemia.  C/o being weak dizzy with low hemoglobin and agreeable for 1 u prbc -discussed risks benefits alternatives and she is agreeable Recent Labs  Lab 08/08/24 0507 08/08/24 1149 08/08/24 1838 08/09/24 0537 08/10/24 0612  HGB 7.1* 7.3* 8.0* 7.3* 7.2*  HCT 23.2* 23.5* 26.5* 23.8* 23.3*     COPD: recently hospitalized for COPD exacerbation 2 weeks ago. Some wheezing on exam  S/p 15-day prednisone  taper  cont inhaler nebs   Hyperthyroidism: Om methimazole since last time/ Of note, thyrotropin receptor antibody, thyroperoxidase antibodies were all negative.  On 9/28 TSH was 0.28, free T4 slightly up at 1.3   PAF /flutter: last time she was hospitalized went into a flutter with RVR into the 160s, started on diltiazem and converted to sinus rhythm.  Eliquis is on hold as above   Chronic diastolic CHF Pulmonary hypertension: Euvolemic,TTE 07/17/2024-LVEF 75%, hyperdynamic function, normal RV size and systolic function. Cont home meds    CKD 3A: creatinine at baseline stabe.  Recent Labs    07/18/24 0456 07/19/24 0529 07/20/24 0503 07/20/24 1746 07/22/24 0257 08/03/24 1513 08/04/24 1222 08/07/24 1337 08/08/24 0507 08/09/24 0537 08/10/24 0612  BUN 16 30* 42*  --  43* 21 17 28* 32* 22 23  CREATININE 1.17* 1.31* 1.47*  --  1.32* 1.16* 1.32* 1.26* 1.40* 1.19* 1.36*  CO2 22 26 27   --  27 23 24 24 24 24 24   K 4.2 4.7 4.2   < > 4.3 5.0 4.5 4.7 4.9 4.5 3.7   < > = values in this interval not displayed.    Severe MR: S/p mitral valve repair in 2020.  Most recent 2D echo without acute findings   Essential hypertension: beta-blocker was discontinued last time she was in the hospital due to reported cocaine use. Stable, continue her Cardizem, losartan  on hold.   AAA: monitored as an outpatient   DVT prophylaxis: SCDs Start: 08/07/24 1935 Code Status:   Code  Status: Limited: Do not attempt resuscitation (DNR) -DNR-LIMITED -Do Not Intubate/DNI  Family Communication: plan of care discussed with patient at bedside. Patient status is: Remains hospitalized because of severity of illness Level of care: Telemetry   Dispo: The patient is from: home            Anticipated disposition: TBD Objective: Vitals last 24 hrs: Vitals:   08/09/24 1336 08/09/24 1345 08/09/24 2033  08/10/24 0446  BP: (!) 147/82  107/64 110/71  Pulse: 75  95 77  Resp:   18 18  Temp:  98 F (36.7 C) 98.5 F (36.9 C) 98.3 F (36.8 C)  TempSrc:  Oral Oral Oral  SpO2: 98%  99% 100%  Weight:      Height:        Physical Examination: General exam: AAOX3 HEENT:Oral mucosa moist, Ear/Nose WNL grossly Respiratory system: Bilaterally clear BS,no use of accessory muscle Cardiovascular system: S1 & S2 +, No JVD. Gastrointestinal system: Abdomen soft,NT,ND, BS+ Nervous System: Alert, awake, moving all extremities,and following commands. Extremities: extremities warm, leg edema neg Skin: Warm, no rashes MSK: Normal muscle bulk,tone, power.   Medications reviewed:  Scheduled Meds:  budesonide -glycopyrrolate -formoterol   2 puff Inhalation BID   diltiazem  120 mg Oral Daily   loratadine  10 mg Oral Daily   methimazole  5 mg Oral BID   nicotine   14 mg Transdermal Daily   pantoprazole  (PROTONIX ) IV  40 mg Intravenous Q12H   rosuvastatin   40 mg Oral QHS   Continuous Infusions:   Diet: Diet Order             Diet Heart Room service appropriate? Yes; Fluid consistency: Thin  Diet effective now                     Data Reviewed: I have personally reviewed following labs and imaging studies ( see epic result tab) CBC: Recent Labs  Lab 08/04/24 1222 08/07/24 1337 08/08/24 0507 08/08/24 1149 08/08/24 1838 08/09/24 0537 08/10/24 0612  WBC 13.8* 10.3 7.4  --   --  5.4 6.3  HGB 9.2* 8.1* 7.1* 7.3* 8.0* 7.3* 7.2*  HCT 28.9* 26.8* 23.2* 23.5* 26.5* 23.8* 23.3*  MCV 94.8 96.1 96.3  --   --  94.1 97.1  PLT 197 186 146*  --   --  146* 147*   CMP: Recent Labs  Lab 08/04/24 1222 08/07/24 1337 08/08/24 0507 08/09/24 0537 08/10/24 0612  NA 136 142 138 137 142  K 4.5 4.7 4.9 4.5 3.7  CL 103 107 106 102 106  CO2 24 24 24 24 24   GLUCOSE 119* 110* 92 97 76  BUN 17 28* 32* 22 23  CREATININE 1.32* 1.26* 1.40* 1.19* 1.36*  CALCIUM  8.9 9.7 9.3 8.9 8.4*   GFR: Estimated  Creatinine Clearance: 29.9 mL/min (A) (by C-G formula based on SCr of 1.36 mg/dL (H)). Recent Labs  Lab 08/07/24 1337 08/08/24 0507  AST 26 14*  ALT 22 17  ALKPHOS 58 50  BILITOT 0.5 0.4  PROT 6.0* 5.2*  ALBUMIN  3.6 3.2*   No results for input(s): LIPASE, AMYLASE in the last 168 hours. No results for input(s): AMMONIA in the last 168 hours. Coagulation Profile: No results for input(s): INR, PROTIME in the last 168 hours. Unresulted Labs (From admission, onward)     Start     Ordered   08/09/24 0500  Basic metabolic panel with GFR  Daily,   R      08/08/24  9244   08/09/24 0500  CBC  Daily,   R      08/08/24 0755           Antimicrobials/Microbiology: Anti-infectives (From admission, onward)    None         Component Value Date/Time   SDES SPUTUM 07/21/2024 1130   SDES  07/21/2024 1130    SPUTUM Performed at [facility], [address] [place], [place] [address]    SPECREQUEST Normal 07/21/2024 1130   SPECREQUEST  07/21/2024 1130    Normal Reflexed from [id number] Performed at [facility], [address] [place], [place] [address]    CULT  07/21/2024 1130    Normal respiratory flora-no Staph aureus or Pseudomonas seen Performed at [name], [address], [place], [place] [address]    REPTSTATUS PENDING 07/21/2024 1130   REPTSTATUS 07/24/2024 FINAL 07/21/2024 1130    Procedures: Procedure(s) (LRB): COLONOSCOPY (N/A) IMAGING PROCEDURE, GI TRACT, INTRALUMINAL, VIA CAPSULE (N/A)   Mennie LAMY, MD [facility] 08/10/2024, 11:54 AM

## 2024-08-10 NOTE — Plan of Care (Signed)
°  Problem: Education: Goal: Knowledge of General Education information will improve Description: Including pain rating scale, medication(s)/side effects and non-pharmacologic comfort measures Outcome: Progressing   Problem: Health Behavior/Discharge Planning: Goal: Ability to manage health-related needs will improve Outcome: Progressing   Problem: Clinical Measurements: Goal: Ability to maintain clinical measurements within normal limits will improve Outcome: Progressing   Problem: Activity: Goal: Risk for activity intolerance will decrease Outcome: Progressing   Problem: Nutrition: Goal: Adequate nutrition will be maintained Outcome: Progressing   Problem: Safety: Goal: Ability to remain free from injury will improve Outcome: Progressing

## 2024-08-11 DIAGNOSIS — Z860101 Personal history of adenomatous and serrated colon polyps: Secondary | ICD-10-CM

## 2024-08-11 DIAGNOSIS — Z7901 Long term (current) use of anticoagulants: Secondary | ICD-10-CM | POA: Diagnosis not present

## 2024-08-11 DIAGNOSIS — D649 Anemia, unspecified: Secondary | ICD-10-CM | POA: Diagnosis not present

## 2024-08-11 DIAGNOSIS — K921 Melena: Secondary | ICD-10-CM | POA: Diagnosis not present

## 2024-08-11 DIAGNOSIS — K5520 Angiodysplasia of colon without hemorrhage: Secondary | ICD-10-CM

## 2024-08-11 DIAGNOSIS — D509 Iron deficiency anemia, unspecified: Secondary | ICD-10-CM | POA: Diagnosis not present

## 2024-08-11 LAB — BASIC METABOLIC PANEL WITH GFR
Anion gap: 9 (ref 5–15)
BUN: 24 mg/dL — ABNORMAL HIGH (ref 8–23)
CO2: 23 mmol/L (ref 22–32)
Calcium: 8.7 mg/dL — ABNORMAL LOW (ref 8.9–10.3)
Chloride: 108 mmol/L (ref 98–111)
Creatinine, Ser: 1.33 mg/dL — ABNORMAL HIGH (ref 0.44–1.00)
GFR, Estimated: 41 mL/min — ABNORMAL LOW (ref >60)
Glucose, Bld: 88 mg/dL (ref 70–99)
Potassium: 4 mmol/L (ref 3.5–5.1)
Sodium: 140 mmol/L (ref 135–145)

## 2024-08-11 LAB — TYPE AND SCREEN
ABO/RH(D): A POS
Antibody Screen: NEGATIVE
Unit division: 0

## 2024-08-11 LAB — BPAM RBC
Blood Product Expiration Date: 202511052359
ISSUE DATE / TIME: 202510221140
Unit Type and Rh: 6200

## 2024-08-11 LAB — CBC
HCT: 28.7 % — ABNORMAL LOW (ref 36.0–46.0)
Hemoglobin: 9.1 g/dL — ABNORMAL LOW (ref 12.0–15.0)
MCH: 29.6 pg (ref 26.0–34.0)
MCHC: 31.7 g/dL (ref 30.0–36.0)
MCV: 93.5 fL (ref 80.0–100.0)
Platelets: 134 K/uL — ABNORMAL LOW (ref 150–400)
RBC: 3.07 MIL/uL — ABNORMAL LOW (ref 3.87–5.11)
RDW: 16.2 % — ABNORMAL HIGH (ref 11.5–15.5)
WBC: 6.5 K/uL (ref 4.0–10.5)
nRBC: 0 % (ref 0.0–0.2)

## 2024-08-11 MED ORDER — FERROUS SULFATE 325 (65 FE) MG PO TABS
325.0000 mg | ORAL_TABLET | Freq: Every day | ORAL | Status: DC
Start: 2024-08-12 — End: 2024-08-11

## 2024-08-11 MED ORDER — FERROUS SULFATE 325 (65 FE) MG PO TABS: 325.0000 mg | ORAL_TABLET | Freq: Every day | ORAL | 0 refills | Status: AC

## 2024-08-11 MED ORDER — APIXABAN 5 MG PO TABS: 5.0000 mg | ORAL_TABLET | Freq: Two times a day (BID) | ORAL | Status: AC

## 2024-08-11 MED ORDER — GUAIFENESIN ER 600 MG PO TB12: 1200.0000 mg | ORAL_TABLET | Freq: Two times a day (BID) | ORAL | Status: AC | PRN

## 2024-08-11 NOTE — Discharge Summary (Signed)
 Physician Discharge Summary  Katie [name] FMW:[id number] DOB: 09/27/1946 DOA: 08/07/2024  PCP: Katie Leni Edyth DELENA, MD  Admit date: 08/07/2024 Discharge date: 08/11/2024  Admitted From:  Discharge disposition: Home   Recommendations for Outpatient Follow-Up:   P.o. iron and GI as patient to be set up for IV iron (consult placed) Eliquis to be started on 10/2 5   Discharge Diagnosis:   Principal Problem:   Symptomatic anemia Active Problems:   Occult blood in stools   Iron deficiency   Iron deficiency anemia   History of colonic polyps   Hx of adenomatous colonic polyps    Discharge Condition: Improved.  Diet recommendation: Low sodium, heart healthy.  Carbohydrate-modified.  Regular.  Wound care: None.  Code status: Full.    Symptomatic anemia Melena/FOBT positive stool: Patient hemoglobin had dropped from 10 to 7.1 g since last discharge with black tarry stool.  S/p EGD 10/20:EGD mostly unremarkable other than slight gastritis S/p Colonoscopy 10/21  Video capsule endoscopy: capsule didn't show much at all. He said to start her on PO iron and get her set up for IV iron infusions as an outpatient. He said she can restart her Eliquis on Saturday and go home today  Status post transfusion with a hemoglobin of 9.1-Eliquis still on hold for a flutter   COPD: recently hospitalized for COPD exacerbation 2 weeks ago.  S/p 15-day prednisone  taper  cont inhaler nebs   Hyperthyroidism: Om methimazole since last time/ Of note, thyrotropin receptor antibody, thyroperoxidase antibodies were all negative.  -Outpatient endocrine follow-up   PAF /flutter: last time she was hospitalized went into a flutter with RVR into the 160s, started on diltiazem and converted to sinus rhythm.  Eliquis is on hold as above   Chronic diastolic CHF Pulmonary hypertension: Euvolemic,TTE 07/17/2024-LVEF 75%, hyperdynamic function, normal RV size and systolic function. Cont home  meds    CKD 3A: -Trend   Severe MR: S/p mitral valve repair in 2020.  Most recent 2D echo without acute findings   Essential hypertension: beta-blocker was discontinued last time she was in the hospital due to reported cocaine use. Stable, continue her Cardizem, losartan  on hold.   AAA: monitored as an outpatient         Medical Consultants:    GI  Discharge Exam:   Vitals:   08/10/24 2048 08/11/24 0534  BP: 99/68 138/88  Pulse: 79 73  Resp: 14 14  Temp: 98.8 F (37.1 C) 98.2 F (36.8 C)  SpO2: 99% 100%   Vitals:   08/10/24 1402 08/10/24 1433 08/10/24 2048 08/11/24 0534  BP: 105/69 117/67 99/68 138/88  Pulse: 69 69 79 73  Resp: 18 (!) 21 14 14   Temp: 99 F (37.2 C) 98.6 F (37 C) 98.8 F (37.1 C) 98.2 F (36.8 C)  TempSrc: Oral Oral Oral Oral  SpO2: 100% 97% 99% 100%  Weight:      Height:        General exam: Appears calm and comfortable.   The results of significant diagnostics from this hospitalization (including imaging, microbiology, ancillary and laboratory) are listed below for reference.     Procedures and Diagnostic Studies:   No results found.   Labs:   Basic Metabolic Panel: Recent Labs  Lab 08/07/24 1337 08/08/24 0507 08/09/24 0537 08/10/24 0612 08/11/24 0601  NA 142 138 137 142 140  K 4.7 4.9 4.5 3.7 4.0  CL 107 106 102 106 108  CO2 24 24 24  24  23  GLUCOSE 110* 92 97 76 88  BUN 28* 32* 22 23 24*  CREATININE 1.26* 1.40* 1.19* 1.36* 1.33*  CALCIUM  9.7 9.3 8.9 8.4* 8.7*   GFR Estimated Creatinine Clearance: 30.6 mL/min (A) (by C-G formula based on SCr of 1.33 mg/dL (H)). Liver Function Tests: Recent Labs  Lab 08/07/24 1337 08/08/24 0507  AST 26 14*  ALT 22 17  ALKPHOS 58 50  BILITOT 0.5 0.4  PROT 6.0* 5.2*  ALBUMIN  3.6 3.2*   No results for input(s): LIPASE, AMYLASE in the last 168 hours. No results for input(s): AMMONIA in the last 168 hours. Coagulation profile No results for input(s): INR, PROTIME  in the last 168 hours.  CBC: Recent Labs  Lab 08/07/24 1337 08/08/24 0507 08/08/24 1149 08/08/24 1838 08/09/24 0537 08/10/24 0612 08/11/24 0601  WBC 10.3 7.4  --   --  5.4 6.3 6.5  HGB 8.1* 7.1* 7.3* 8.0* 7.3* 7.2* 9.1*  HCT 26.8* 23.2* 23.5* 26.5* 23.8* 23.3* 28.7*  MCV 96.1 96.3  --   --  94.1 97.1 93.5  PLT 186 146*  --   --  146* 147* 134*   Cardiac Enzymes: No results for input(s): CKTOTAL, CKMB, CKMBINDEX, TROPONINI in the last 168 hours. BNP: Invalid input(s): POCBNP CBG: No results for input(s): GLUCAP in the last 168 hours. D-Dimer No results for input(s): DDIMER in the last 72 hours. Hgb A1c No results for input(s): HGBA1C in the last 72 hours. Lipid Profile No results for input(s): CHOL, HDL, LDLCALC, TRIG, CHOLHDL, LDLDIRECT in the last 72 hours. Thyroid  function studies No results for input(s): TSH, T4TOTAL, T3FREE, THYROIDAB in the last 72 hours.  Invalid input(s): FREET3 Anemia work up No results for input(s): VITAMINB12, FOLATE, FERRITIN, TIBC, IRON, RETICCTPCT in the last 72 hours. Microbiology No results found for this or any previous visit (from the past 240 hours).   Discharge Instructions:   Discharge Instructions     Amb Referral to Intravenous Iron Therapy   Complete by: As directed    You have been referred to [facility] Infusion team for IV Iron Infusions. The infusion pharmacy team will reach out to you with appointment information.    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)   Secondary diagnosis code for IV iron: I11.0 - Hypertensive heart disease with heart failure   Diet - low sodium heart healthy   Complete by: As directed    Discharge instructions   Complete by: As directed    Cbc 1 week PO iron and outpatient Fe   Increase activity slowly   Complete by: As directed       Allergies as of 08/11/2024   No Known  Allergies      Medication List     STOP taking these medications    doxycycline  100 MG capsule Commonly known as: MONODOX    predniSONE  10 MG tablet Commonly known as: DELTASONE        TAKE these medications    albuterol  108 (90 Base) MCG/ACT inhaler Commonly known as: VENTOLIN  HFA Inhale 2 puffs into the lungs every 6 (six) hours as needed for wheezing or shortness of breath.   apixaban 5 MG Tabs tablet Commonly known as: ELIQUIS Take 1 tablet (5 mg total) by mouth 2 (two) times daily. Start taking on: August 13, 2024   diltiazem 120 MG 24 hr capsule Commonly known as: CARDIZEM CD Take 1 capsule (120 mg total) by mouth daily.  escitalopram 10 MG tablet Commonly known as: LEXAPRO Take 10 mg by mouth daily.   ferrous sulfate 325 (65 FE) MG tablet Take 1 tablet (325 mg total) by mouth daily with breakfast. Start taking on: August 12, 2024   furosemide  40 MG tablet Commonly known as: Lasix  Take 1 tablet (40 mg total) by mouth daily as needed for edema or fluid (For weight gain > 2 pounds in 24 hours or > 5 pounds in a week).   guaiFENesin  600 MG 12 hr tablet Commonly known as: MUCINEX  Take 2 tablets (1,200 mg total) by mouth 2 (two) times daily as needed.   levalbuterol  0.63 MG/3ML nebulizer solution Commonly known as: XOPENEX  Take 3 mLs (0.63 mg total) by nebulization every 2 (two) hours as needed for wheezing or shortness of breath.   loratadine 10 MG tablet Commonly known as: CLARITIN Take 1 tablet (10 mg total) by mouth daily.   losartan  25 MG tablet Commonly known as: COZAAR  Take 1 tablet (25 mg total) by mouth daily.   methimazole 5 MG tablet Commonly known as: TAPAZOLE Take 1 tablet (5 mg total) by mouth 2 (two) times daily.   nicotine  14 mg/24hr patch Commonly known as: NICODERM CQ  - dosed in mg/24 hours Place 1 patch (14 mg total) onto the skin daily.   pantoprazole  40 MG tablet Commonly known as: PROTONIX  Take 1 tablet (40 mg total) by  mouth daily.   rosuvastatin  40 MG tablet Commonly known as: CRESTOR  Take 40 mg by mouth daily.   Trelegy Ellipta  200-62.5-25 MCG/ACT Aepb Generic drug: Fluticasone -Umeclidin-Vilant Inhale 1 puff into the lungs daily.        Follow-up Information     Vannie Reche RAMAN, NP Follow up.   Specialty: Cardiology Why: Cardiology follow-up - we cancelled your cardiology appointment 08/10/24 given that you were in the hospital. We have rescheduled this to the [facility] LOCATION at [facility] [place] on Tuesday Aug 16, 2024 at 3:35 PM. Arrive 15 minutes prior to appointment to check in.  **please note location of [facility] information: [address] [place] [address] [phone]                  Time coordinating discharge: 45 minutes  Signed:  Harlene RAYMOND Bowl DO  [facility] 08/11/2024, 1:23 PM

## 2024-08-11 NOTE — Plan of Care (Signed)
  Problem: Clinical Measurements: Goal: Will remain free from infection Outcome: Progressing Goal: Diagnostic test results will improve Outcome: Progressing   Problem: Elimination: Goal: Will not experience complications related to bowel motility Outcome: Progressing Goal: Will not experience complications related to urinary retention Outcome: Progressing   Problem: Safety: Goal: Ability to remain free from injury will improve Outcome: Progressing   Problem: Skin Integrity: Goal: Risk for impaired skin integrity will decrease Outcome: Progressing

## 2024-08-11 NOTE — Plan of Care (Signed)

## 2024-08-11 NOTE — Progress Notes (Signed)
 Progress Note    Katie [name]  FMW:[id number] DOB: 09/28/1946  DOA: 08/07/2024 PCP: [name], MD    Brief Narrative:     Katie [name] is an 78 y.o. female with PMH of COPD seen pulmonary as an outpatient, pulmonary hypertension, chronic diastolic CHF, HTN, HLD, CKD 3A, AAA with graft comes into the hospital with complaints of weakness, fatigue. She was hospitalized and discharged about 2 weeks ago for COPD exacerbation, and since being in the hospital she has started to see black stools, and has seen them since. PCP saw her few days ago and a fecal occult was positive. his was also discussed with her cardiologist and she has been holding her Eliquis since 10/18 and given persistent symptoms she decided to come to the ED   Assessment/Plan:  Symptomatic anemia Melena/FOBT positive stool: Patient hemoglobin had dropped from 10 to 7.1 g since last discharge with black tarry stool.  S/p EGD 10/20:EGD mostly unremarkable other than slight gastritis S/p Colonoscopy 10/21  Video capsule endoscopy pending read Status post transfusion with a hemoglobin of 9.1-Eliquis still on hold for a flutter   COPD: recently hospitalized for COPD exacerbation 2 weeks ago.  S/p 15-day prednisone  taper  cont inhaler nebs   Hyperthyroidism: Om methimazole since last time/ Of note, thyrotropin receptor antibody, thyroperoxidase antibodies were all negative.  -Outpatient endocrine follow-up   PAF /flutter: last time she was hospitalized went into a flutter with RVR into the 160s, started on diltiazem and converted to sinus rhythm.  Eliquis is on hold as above   Chronic diastolic CHF Pulmonary hypertension: Euvolemic,TTE 07/17/2024-LVEF 75%, hyperdynamic function, normal RV size and systolic function. Cont home meds    CKD 3A: -Trend  Severe MR: S/p mitral valve repair in 2020.  Most recent 2D echo without acute findings   Essential hypertension: beta-blocker was discontinued last  time she was in the hospital due to reported cocaine use. Stable, continue her Cardizem, losartan  on hold.   AAA: monitored as an outpatient       Medical Consultants:   GI    Subjective:   No complaints, eating well No further dizziness since blood transfusion  Objective:    Vitals:   08/10/24 1402 08/10/24 1433 08/10/24 2048 08/11/24 0534  BP: 105/69 117/67 99/68 138/88  Pulse: 69 69 79 73  Resp: 18 (!) 21 14 14   Temp: 99 F (37.2 C) 98.6 F (37 C) 98.8 F (37.1 C) 98.2 F (36.8 C)  TempSrc: Oral Oral Oral Oral  SpO2: 100% 97% 99% 100%  Weight:      Height:        Intake/Output Summary (Last 24 hours) at 08/11/2024 1027 Last data filed at 08/10/2024 1400 Gross per 24 hour  Intake 554.67 ml  Output --  Net 554.67 ml   Filed Weights   08/08/24 1357 08/09/24 1127 08/09/24 1257  Weight: 63 kg (P) 63 kg 63 kg    Exam:  General: Appearance:     Overweight female in no acute distress     Lungs:     respirations unlabored  Heart:    Normal heart rate.     MS:   All extremities are intact.   Neurologic:   Awake, alert, oriented x 3. No apparent focal neurological           defect.      Data Reviewed:   I have personally reviewed following labs and imaging studies:  Labs: Labs show the  following:   Basic Metabolic Panel: Recent Labs  Lab 08/07/24 1337 08/08/24 0507 08/09/24 0537 08/10/24 0612 08/11/24 0601  NA 142 138 137 142 140  K 4.7 4.9 4.5 3.7 4.0  CL 107 106 102 106 108  CO2 24 24 24 24 23   GLUCOSE 110* 92 97 76 88  BUN 28* 32* 22 23 24*  CREATININE 1.26* 1.40* 1.19* 1.36* 1.33*  CALCIUM  9.7 9.3 8.9 8.4* 8.7*   GFR Estimated Creatinine Clearance: 30.6 mL/min (A) (by C-G formula based on SCr of 1.33 mg/dL (H)). Liver Function Tests: Recent Labs  Lab 08/07/24 1337 08/08/24 0507  AST 26 14*  ALT 22 17  ALKPHOS 58 50  BILITOT 0.5 0.4  PROT 6.0* 5.2*  ALBUMIN  3.6 3.2*   No results for input(s): LIPASE, AMYLASE in the  last 168 hours. No results for input(s): AMMONIA in the last 168 hours. Coagulation profile No results for input(s): INR, PROTIME in the last 168 hours.  CBC: Recent Labs  Lab 08/07/24 1337 08/08/24 0507 08/08/24 1149 08/08/24 1838 08/09/24 0537 08/10/24 0612 08/11/24 0601  WBC 10.3 7.4  --   --  5.4 6.3 6.5  HGB 8.1* 7.1* 7.3* 8.0* 7.3* 7.2* 9.1*  HCT 26.8* 23.2* 23.5* 26.5* 23.8* 23.3* 28.7*  MCV 96.1 96.3  --   --  94.1 97.1 93.5  PLT 186 146*  --   --  146* 147* 134*   Cardiac Enzymes: No results for input(s): CKTOTAL, CKMB, CKMBINDEX, TROPONINI in the last 168 hours. BNP (last 3 results) Recent Labs    07/17/24 0033 08/03/24 1513  PROBNP 7,015.0* 3,069*   CBG: No results for input(s): GLUCAP in the last 168 hours. D-Dimer: No results for input(s): DDIMER in the last 72 hours. Hgb A1c: No results for input(s): HGBA1C in the last 72 hours. Lipid Profile: No results for input(s): CHOL, HDL, LDLCALC, TRIG, CHOLHDL, LDLDIRECT in the last 72 hours. Thyroid  function studies: No results for input(s): TSH, T4TOTAL, T3FREE, THYROIDAB in the last 72 hours.  Invalid input(s): FREET3 Anemia work up: No results for input(s): VITAMINB12, FOLATE, FERRITIN, TIBC, IRON, RETICCTPCT in the last 72 hours. Sepsis Labs: Recent Labs  Lab 08/08/24 0507 08/09/24 0537 08/10/24 0612 08/11/24 0601  WBC 7.4 5.4 6.3 6.5    Microbiology No results found for this or any previous visit (from the past 240 hours).  Procedures and diagnostic studies:  No results found.  Medications:    budesonide -glycopyrrolate -formoterol   2 puff Inhalation BID   diltiazem  120 mg Oral Daily   loratadine  10 mg Oral Daily   methimazole  5 mg Oral BID   nicotine   14 mg Transdermal Daily   pantoprazole  (PROTONIX ) IV  40 mg Intravenous Q12H   rosuvastatin   40 mg Oral QHS   Continuous Infusions:   LOS: 1 day   Harlene RAYMOND Bowl  [facility]   How to contact the [facility] Attending or Consulting provider 7A - 7P or covering provider during after hours 7P -7A, for this patient?  Check the care team in [facility] and look for a) attending/consulting [facility] provider listed and b) the [facility] team listed Log into [email or web address] and use [place]'s universal password to access. If you do not have the password, please contact the hospital operator. Locate the [facility] provider you are looking for under [facility] and page to a number that you can be directly reached. If you still have difficulty reaching the provider, please page the [facility] (Director on  Call) for the Hospitalists listed on amion for assistance.  08/11/2024, 10:27 AM

## 2024-08-11 NOTE — Progress Notes (Addendum)
 [facility] Progress Note  CC:  Anemia and heme positive stool   Subjective:  She feels good.  Had a brown BM this morning.   Objective:  Vital signs in last 24 hours: Temp:  [98.2 F (36.8 C)-99.9 F (37.7 C)] 98.2 F (36.8 C) (10/23 0534) Pulse Rate:  [69-79] 73 (10/23 0534) Resp:  [14-21] 14 (10/23 0534) BP: (99-138)/(56-88) 138/88 (10/23 0534) SpO2:  [97 %-100 %] 100 % (10/23 0534) Last BM Date : 08/09/24 General:  Alert, Well-developed, in NAD Heart:  Regular rate and rhythm; no murmurs Pulm:  Some course breath sounds noted. Abdomen:  Soft, non-distended.  BS present.  Non-tender.  Extremities:  Without edema. Neurologic:  Alert and oriented x 4;  grossly normal neurologically. Psych:  Alert and cooperative. Normal mood and affect.  Intake/Output from previous day: 10/22 0701 - 10/23 0700 In: 554.7 [P.O.:240; I.V.:0.2; Blood:314.5] Out: -   Lab Results: Recent Labs    08/09/24 0537 08/10/24 0612 08/11/24 0601  WBC 5.4 6.3 6.5  HGB 7.3* 7.2* 9.1*  HCT 23.8* 23.3* 28.7*  PLT 146* 147* 134*   BMET Recent Labs    08/09/24 0537 08/10/24 0612 08/11/24 0601  NA 137 142 140  K 4.5 3.7 4.0  CL 102 106 108  CO2 24 24 23   GLUCOSE 97 76 88  BUN 22 23 24*  CREATININE 1.19* 1.36* 1.33*  CALCIUM  8.9 8.4* 8.7*   Assessment / Plan: *Symptomatic anemia with dark stools for the past couple weeks and Hemoccult positive.  Hemoglobin around 14 g about a month ago, and was down to 7.2 g.  Had been stable but quite low so they gave one unit of PRBCs on 10/22 with improvement in Hgb to 9.1 grams today.  EGD and colonoscopy here were unrevealing so underwent capsule endoscopy. *Paroxysmal atrial fibrillation/flutter, on Eliquis which has now been on hold with last dose Friday morning as cardiology told her to hold it due to her symptoms. *COPD, recently hospitalized 2 weeks ago and was placed on steroids.  Tells me that he breathing is much  better. *Chronic kidney disease stage III AA   -Await results of VCE.  There were technical issues with it yesterday so it could not be read.  She has not had any sign of active bleeding so I think there is chance that she could be discharged today.  We do need to make a decision about her Eliquis restart though as well. -Monitor Hgb.  -She already has a follow-up in our office on 11/10.   LOS: 1 day   Harlene BIRCH. Zehr  08/11/2024, 9:21 AM     Attending Physician's Attestation   I have taken an interval history, reviewed the chart and examined the patient.   Patient seen prior to discharge and is overall stable.  VCE formally has not been read completely due to technical difficulties but I anticipate completion by this afternoon.  OK to restart anticoagulation this weekend.  She would like to go home.  We will update if there is something urgent that would be required, but ideally, if patient can be seen by hematology as outpatient for IV Iron infusions that will help guide next steps in her therapy.  Followup in GI clinic will be arranged as well pending VCE results.   I agree with the Advanced Practitioner's note, impression, and recommendations with updates and my documentation as noted above.  The majority of the medical decision making/process, formulation of the  impression/plan of action for the patient were performed by me with substantive portion of this encounter (>50% time spent including complete performance of at least one of the key components of MDM, History, and/or Exam).   Aloha Finner, MD [facility] Advanced Endoscopy Office # [phone]   PM Addendum  VCE Results  Formal VCE results to be scanned into the chart. 2 AVMs noted beyond push enteroscopy but would be within reach of single balloon. No active GI bleeding noted with complete capsule evaluation to the cecum.  Recommendations remain to have anticoagulation restart on 10/25 (this  weekend). Recommend PO Iron in 1-week daily. Recommend IV Iron and Hematology evaluation as outpatient for optimization. Would hold on Single Balloon Enteroscopy unless clinical changes develop or recurrent anemia develops.

## 2024-08-12 ENCOUNTER — Ambulatory Visit: Payer: Self-pay | Admitting: Gastroenterology

## 2024-08-12 ENCOUNTER — Telehealth (HOSPITAL_COMMUNITY): Payer: Self-pay | Admitting: Internal Medicine

## 2024-08-12 ENCOUNTER — Telehealth (HOSPITAL_COMMUNITY): Payer: Self-pay

## 2024-08-12 NOTE — Telephone Encounter (Signed)
 Patient has medicare advantage plan through [employer]. Called authorization line at [phone] to ask whether prior authorization is required under medical as a buy/bill.  [id number] requires prior authorization. [phone] requires prior authorization. G8243 does NOT require prior authorization.   Spoke to [place]. Call reference number [id number].

## 2024-08-12 NOTE — Telephone Encounter (Signed)
 Auth Submission: NO AUTH NEEDED Site of care: Site of care: [facility] Payer: [employer] Medicare Medication & CPT/J Code(s) submitted: Venofer (Iron Sucrose) J1756 Diagnosis Code: D50.9 Route of submission (phone, fax, portal): phone Phone # Fax # Auth type: Buy/Bill HB Units/visits requested: 300mg  x 3 doses Reference number: [id number] (spoke to [place]) Approval from: 08/12/24 to 10/19/24

## 2024-08-12 NOTE — Telephone Encounter (Signed)
 Patient referred to infusion pharmacy team for ambulatory infusion of IV iron.  Insurance - Teacher, adult education of care - Site of care: [facility] Dx code - D50.9 IV Iron Therapy - Venofer 300 mg x 3 Infusion appointments - Scheduling team will schedule patient as soon as possible.   Thank you,  Norton Blush, PharmD, BCSCP Pharmacist [facility]

## 2024-08-14 NOTE — Progress Notes (Deleted)
 Cardiology Office Note:    Date:  08/14/2024   ID:  Katie [name], DOB May 23, 1946, MRN [id number]  PCP:  [name], MD   [facility] Providers Cardiologist:  Annabella Scarce, MD { Click to update primary MD,subspecialty MD or APP then REFRESH:1}    Referring MD: [name], MD   Chief complaint: Hospital follow-up     History of Present Illness:   Katie [name] is a 78 y.o. female with a hx of MVP and severe MR s/p minimally invasive MVrepair 2020, nonobstructive CAD, HFpEF, carotid artery disease, HTN, HLD, COPD, cocaine use, renal cell carcinoma s/p right nephrectomy 2011, CKDIII, tobacco use.  Admitted 02/2019 with LE edema and SOB, R/LHC revealed 25% lesion in proximal-mid LCfx, proximal LAD, severe MR.  Underwent minimally invasive mitral valve repair on 04/20/2019.  Developed postop atrial fibrillation, started on amiodarone  and Coumadin  therapy.  Amiodarone  was eventually stopped as patient was maintaining NSR, unclear at what point Coumadin  was stopped.  Lost to follow-up until 04/2021 when patient came in with complaints of a syncopal episode.  Heart monitor showed predominantly sinus rhythm, 2 episodes of NSVT lasting 5 beats at each.  Subsequent echo demonstrated EF 50-55%, trivial MR, hypokinesis of LV basal mid inferior wall.  Followed by Dr. Melvenia for infrarenal AAA.  Admitted to the hospital 12/2021 for repair of AAA via abdominal aortic endovascular stent graft.  Lost follow-up with cardiology.  Throughout 2024 and most of 2025 patient was seen frequently in the emergency department for various issues.  She was admitted 02/2024 for AKI in the setting of GI illness and ongoing cocaine use.  Was admitted to the hospital 07/16/2024 secondary to pneumonia.  RHC showed mild CAD with codominant system and separate ostia for LAD and LCx, normal filling pressures with very mild MS.  Symptoms were suspected secondary to COPD, medical therapy was recommended.   Developed brief atrial flutter on telemetry, was started on diltiazem and Eliquis.  Last seen in cardiology office by Raphael Bring on 08/03/2024, long-term monitoring was ordered to evaluate ectopy burden.  Eliquis was opted over Coumadin  due to history of compliance issues.  CBC ordered at this visit showed her hemoglobin had dropped to 9.4, was referred to the emergency department.  Seen in ED 08/04/2024 due to progressive anemia while on OAC.  Patient did try leaving AMA, patient felt to be stable for discharge with strict follow-up precautions, and instructions to monitor for signs of bleeding.  Return to ED on 08/07/2024 with complaints of coughing up blood/blood-streaked sputum, reported her stool would also been dark, and that she stopped her Eliquis 08/06/2024.  Hemoglobin 8.1 in the ED.   Colonoscopy 10/21 revealed internal hemorrhoids, multiple polyps, diverticulitis in the entire colon.  Polyps were removed.  Endoscopy on 10/24 revealed 2 AVMs in the small bowel.  Formal hematology evaluation/referral was considered.  No other evidence of GI bleeding at that time, if it were to continue, single balloon enteroscopy would be considered.  Eliquis advised to be restarted on 07/2024.  Patient was to be started on p.o. iron as well as be set up for IV iron infusions outpatient.  ROS:   Please see the history of present illness.    *** All other systems reviewed and are negative.     Past Medical History:  Diagnosis Date   AAA (abdominal aortic aneurysm)    AKI (acute kidney injury)    Arthritis    Chest pain 04/17/2016  Cholelithiasis 2011   s/p cholescystectomy    Chronic diastolic congestive heart failure (HCC)    COPD (chronic obstructive pulmonary disease) (HCC)    GERD (gastroesophageal reflux disease)    History of renal cell carcinoma    Hypercholesteremia    Hypertension    Hypertension    Mitral regurgitation    Nausea and vomiting 06/19/2022   Pneumonia 1980's    Renal cell carcinoma 01/2010   s/p right radical nephrectomy 01/2010,  followed by [facility]   S/P minimally invasive mitral valve repair 04/20/2019   Complex valvuloplasty including triangular resection of flail segment of posterior leaflet, artificial Gore-tex neochord placement x6 and 30 mm Sorin Memo 4D ring annuloplasty via right mini thoracotomy approach   Shortness of breath    Tuberculosis    Tests positive for PPD. dad had h/o TB.    Past Surgical History:  Procedure Laterality Date   ABDOMINAL AORTIC ENDOVASCULAR STENT GRAFT N/A 01/10/2022   Procedure: ABDOMINAL AORTIC ENDOVASCULAR STENT GRAFT;  Surgeon: Gretta Lonni PARAS, MD;  Location: [facility] OR;  Service: Vascular;  Laterality: N/A;   APPENDECTOMY  1970's   BUBBLE STUDY  03/18/2019   Procedure: BUBBLE STUDY;  Surgeon: Okey Vina GAILS, MD;  Location: [facility] ENDOSCOPY;  Service: Cardiovascular;;   CARDIAC CATHETERIZATION  09/2011   CHOLECYSTECTOMY  01/2010   COLONOSCOPY N/A 08/09/2024   Procedure: COLONOSCOPY;  Surgeon: Wilhelmenia Aloha Raddle., MD;  Location: [facility] ENDOSCOPY;  Service: Gastroenterology;  Laterality: N/A;   DIAGNOSTIC LAPAROSCOPY     ESOPHAGOGASTRODUODENOSCOPY N/A 08/08/2024   Procedure: EGD (ESOPHAGOGASTRODUODENOSCOPY);  Surgeon: Wilhelmenia Aloha Raddle., MD;  Location: [facility];  Service: Gastroenterology;  Laterality: N/A;   GIVENS CAPSULE STUDY N/A 08/09/2024   Procedure: IMAGING PROCEDURE, GI TRACT, INTRALUMINAL, VIA CAPSULE;  Surgeon: Mansouraty, Aloha Raddle., MD;  Location: [facility] ENDOSCOPY;  Service: Gastroenterology;  Laterality: N/A;   KIDNEY SURGERY     LEFT AND RIGHT HEART CATHETERIZATION WITH CORONARY ANGIOGRAM N/A 09/30/2011   Procedure: LEFT AND RIGHT HEART CATHETERIZATION WITH CORONARY ANGIOGRAM;  Surgeon: Oneil Parchment, MD;  Location: [facility] CATH LAB;  Service: Cardiovascular;  Laterality: N/A;   MITRAL VALVE REPAIR Right 04/20/2019   Procedure: MINIMALLY INVASIVE MITRAL VALVE REPAIR (MVR) USING MEMO 4D SIZE  30;  Surgeon: Dusty Sudie DEL, MD;  Location: [facility] OR;  Service: Open Heart Surgery;  Laterality: Right;   MULTIPLE EXTRACTIONS WITH ALVEOLOPLASTY  10/06/2011   Procedure: MULTIPLE EXTRACION WITH ALVEOLOPLASTY;  Surgeon: Tanda JULIANNA Fanny, DDS;  Location[facility] OR;  Service: Oral Surgery;  Laterality: N/A;  Multiple extraction of tooth #'s 1, 6, 8, 10, 11, 22, 23, 26, 27, 28, 29 with alveoloplasty and Upper right buccal exostoses reductions.   NEPHRECTOMY RADICAL  01/2010   right    ORIF WRIST FRACTURE Right 02/17/2022   Procedure: OPEN REDUCTION INTERNAL FIXATION (ORIF) WRIST FRACTURE;  Surgeon: Shari Easter, MD;  Location: [facility] OR;  Service: Orthopedics;  Laterality: Right;  with IV sedation   OVARIAN CYST REMOVAL  1970's   went through belly button   RIGHT HEART CATH N/A 07/20/2024   Procedure: RIGHT HEART CATH;  Surgeon: Cherrie Toribio SAUNDERS, MD;  Location: [facility] INVASIVE CV LAB;  Service: Cardiovascular;  Laterality: N/A;   RIGHT/LEFT HEART CATH AND CORONARY ANGIOGRAPHY N/A 03/22/2019   Procedure: RIGHT/LEFT HEART CATH AND CORONARY ANGIOGRAPHY;  Surgeon: Dann Candyce RAMAN, MD;  Location: [facility] INVASIVE CV LAB;  Service: Cardiovascular;  Laterality: N/A;   TEE WITHOUT CARDIOVERSION  10/01/2011   Procedure: TRANSESOPHAGEAL ECHOCARDIOGRAM (  TEE);  Surgeon: Candyce CANDIE Reek;  Location: [facility] ENDOSCOPY;  Service: Cardiovascular;  Laterality: N/A;   TEE WITHOUT CARDIOVERSION N/A 03/18/2019   Procedure: TRANSESOPHAGEAL ECHOCARDIOGRAM (TEE);  Surgeon: Okey Vina GAILS, MD;  Location: [facility] ENDOSCOPY;  Service: Cardiovascular;  Laterality: N/A;   TEE WITHOUT CARDIOVERSION N/A 04/20/2019   Procedure: TRANSESOPHAGEAL ECHOCARDIOGRAM (TEE);  Surgeon: Dusty Sudie DEL, MD;  Location: [facility] OR;  Service: Open Heart Surgery;  Laterality: N/A;   TUBAL LIGATION  1970's   US  ECHOCARDIOGRAPHY  09/2011    Current Medications: No outpatient medications have been marked as taking for the 08/16/24 encounter (Appointment) with Vannie Reche RAMAN, NP.     Allergies:   Patient has no known allergies.   Social History   Socioeconomic History   Marital status: Widowed    Spouse name: Not on file   Number of children: 3   Years of education: 12   Highest education level: Not on file  Occupational History   Occupation: Event Organiser: Publishing [employer]    Comment: manages cleaning business  Tobacco Use   Smoking status: Former    Current packs/day: 0.00    Average packs/day: 0.5 packs/day for 53.0 years (26.5 ttl pk-yrs)    Types: Cigarettes    Start date: 03/17/1966    Quit date: 03/18/2019    Years since quitting: 5.4    Passive exposure: Current (Son who lives with her Smokes)   Smokeless tobacco: Never  Vaping Use   Vaping status: Never Used  Substance and Sexual Activity   Alcohol use: Not Currently   Drug use: Yes    Types: Cocaine    Comment: 04/28/2024   Sexual activity: Not Currently  Other Topics Concern   Not on file  Social History Narrative          Lives in [place] with her son.   Retired   Chief Executive Officer Drivers of Corporate Investment Banker Strain: Not on [employer] Insecurity: No Food Insecurity (08/07/2024)   Hunger Vital Sign    Worried About Running Out of Food in the Last Year: Never true    Ran Out of Food in the Last Year: Never true  Transportation Needs: Unmet Transportation Needs (08/07/2024)   PRAPARE - Administrator, Civil Service (Medical): Yes    Lack of Transportation (Non-Medical): Yes  Physical Activity: Not on file  Stress: Not on file  Social Connections: Moderately Isolated (08/07/2024)   Social Connection and Isolation Panel    Frequency of Communication with Friends and Family: Twice a week    Frequency of Social Gatherings with Friends and Family: Twice a week    Attends Religious Services: 1 to 4 times per year    Active Member of [employer] or Organizations: No    Attends Banker Meetings: Never    Marital Status: Widowed      Family History: The patient's ***family history includes COPD in her mother; Diabetes in her brother and brother. There is no history of Colon cancer, Rectal cancer, Esophageal cancer, or Stomach cancer.  EKGs/Labs/Other Studies Reviewed:    The following studies were reviewed today: ***      Recent Labs: 07/17/2024: Magnesium  3.0; TSH 0.284 08/03/2024: NT-Pro BNP 3,069 08/04/2024: B Natriuretic Peptide 913.7 08/08/2024: ALT 17 08/11/2024: BUN 24; Creatinine, Ser 1.33; Hemoglobin 9.1; Platelets 134; Potassium 4.0; Sodium 140  Recent Lipid Panel    Component Value Date/Time   CHOL 307 (H) 10/31/2019  1432   TRIG 342.0 (H) 10/31/2019 1432   HDL 62.60 10/31/2019 1432   CHOLHDL 5 10/31/2019 1432   VLDL 68.4 (H) 10/31/2019 1432   LDLCALC 115 (H) 09/29/2018 0230   LDLDIRECT 151.0 10/31/2019 1432     Risk Assessment/Calculations:   {Does this patient have ATRIAL FIBRILLATION?:[phone]}  No BP recorded.  {Refresh Note OR Click here to enter BP  :1}***         Physical Exam:    VS:  There were no vitals taken for this visit.       Wt Readings from Last 3 Encounters:  08/09/24 138 lb 14.2 oz (63 kg)  08/03/24 139 lb (63 kg)  07/28/24 138 lb 12.8 oz (63 kg)     GEN: *** Well nourished, well developed in no acute distress HEENT: Normal NECK: No JVD; No carotid bruits CARDIAC: *** S1-S2 normal, RRR, no murmurs, rubs, gallops RESPIRATORY:  Clear to auscultation without rales, wheezing or rhonchi  MUSCULOSKELETAL:  No edema; No deformity  SKIN: Warm and dry NEUROLOGIC:  Alert and oriented x 3 PSYCHIATRIC:  Normal affect       Assessment & Plan        {Are you ordering a CV Procedure (e.g. stress test, cath, DCCV, TEE, etc)?   Press F2        :[id number]}    Medication Adjustments/Labs and Tests Ordered: Current medicines are reviewed at length with the patient today.  Concerns regarding medicines are outlined above.  No orders of the defined types were placed  in this encounter.  No orders of the defined types were placed in this encounter.   There are no Patient Instructions on file for this visit.   Signed, Miriam FORBES Shams, NP  08/14/2024 8:59 PM     [facility]

## 2024-08-15 ENCOUNTER — Encounter (HOSPITAL_BASED_OUTPATIENT_CLINIC_OR_DEPARTMENT_OTHER): Payer: Self-pay

## 2024-08-16 ENCOUNTER — Ambulatory Visit (HOSPITAL_BASED_OUTPATIENT_CLINIC_OR_DEPARTMENT_OTHER): Payer: Medicare (Managed Care) | Admitting: Family

## 2024-08-16 ENCOUNTER — Ambulatory Visit (HOSPITAL_COMMUNITY): Payer: Medicare (Managed Care)

## 2024-08-17 ENCOUNTER — Ambulatory Visit (HOSPITAL_COMMUNITY)
Admission: RE | Admit: 2024-08-17 | Discharge: 2024-08-17 | Disposition: A | Discharge status: Home / Self Care | Payer: Medicare (Managed Care) | Source: Ambulatory Visit | Attending: Cardiovascular Disease | Admitting: Cardiovascular Disease

## 2024-08-17 VITALS — BP 145/76 | HR 75 | Temp 98.0°F | Resp 16

## 2024-08-17 DIAGNOSIS — D509 Iron deficiency anemia, unspecified: Secondary | ICD-10-CM | POA: Diagnosis present

## 2024-08-17 MED ORDER — IRON SUCROSE 300 MG IVPB - SIMPLE MED
300.0000 mg | Freq: Once | Status: AC
  Administered 2024-08-17: 300 mg via INTRAVENOUS
  Filled 2024-08-17: qty 300

## 2024-08-17 MED ORDER — SODIUM CHLORIDE 0.9 % IV SOLN: INTRAVENOUS | Status: DC | PRN

## 2024-08-17 NOTE — Progress Notes (Signed)
 Diagnosis:Iron deficiency anemia     Provider: vann,jessica    Procedure: Venofer 300 mg     Note: pt here received Venofer 300 mg without reaction.  Vital signs normal per patient for her upon discharge,  pt ambulatory upon leaving and has appt next week for 2/3 infusion

## 2024-08-24 ENCOUNTER — Ambulatory Visit (HOSPITAL_COMMUNITY)
Admission: RE | Admit: 2024-08-24 | Discharge: 2024-08-24 | Disposition: A | Discharge status: Home / Self Care | Payer: Medicare (Managed Care) | Source: Ambulatory Visit | Attending: Cardiovascular Disease | Admitting: Cardiovascular Disease

## 2024-08-24 VITALS — BP 138/91 | HR 84 | Temp 97.1°F | Resp 16

## 2024-08-24 DIAGNOSIS — D509 Iron deficiency anemia, unspecified: Secondary | ICD-10-CM | POA: Diagnosis present

## 2024-08-24 MED ORDER — SODIUM CHLORIDE 0.9 % IV SOLN
300.0000 mg | Freq: Once | INTRAVENOUS | Status: AC
  Administered 2024-08-24: 300 mg via INTRAVENOUS
  Filled 2024-08-24: qty 15

## 2024-08-24 MED ORDER — SODIUM CHLORIDE 0.9 % IV SOLN: INTRAVENOUS | Status: DC | PRN

## 2024-08-24 NOTE — Progress Notes (Signed)
 PATIENT CARE CENTER NOTE:  Diagnosis: Iron deficiency anemia; unspecified type  Provider: Harlene Bowl DO  Procedure: Venofer 300mg  infusion    Patient received  via PIV Venofer ( dose # 2 of 3). No premeds required per therapy plan.  Observed for at least 30 minutes post infusion. Tolerated well, no adverse reaction noted, vitals stable. Pt instructed to schedule final infusion in 1 week  at from desk prior to leaving, verbalized understanding. Pt declined printed AVS. Patient alert, oriented, and ambulatory at the time of discharge.

## 2024-08-29 ENCOUNTER — Ambulatory Visit: Payer: Medicare (Managed Care) | Admitting: Gastroenterology

## 2024-08-29 ENCOUNTER — Encounter (HOSPITAL_COMMUNITY): Payer: Self-pay | Admitting: Internal Medicine

## 2024-08-29 NOTE — Progress Notes (Deleted)
 Katie [name] [id number] Dec 26, 1945   Chief Complaint:  Referring Provider: [name], MD Primary GI MD: Dr. Charlanne  HPI: Katie [name] is a 78 y.o. female with past medical history of AAA s/p endovascular stent graft, AKI, CHF, COPD, GERD, renal cell carcinoma s/p radical nephrectomy, CKD stage III, HTN, HLD, prior mitral valve repair, PAF/flutter on Eliquis, prior appendectomy, cholecystectomy who presents today for a complaint of *** .    Admitted to the hospital 07/16/2024 to 07/25/2024 for acute respiratory failure with hypoxia.  During that admission noted to have normocytic anemia and iron deficiency with downward trending hemoglobin, 11 on admission and trended downward to 9.3.  Labs remained stable during admission and she was advised to have outpatient GI workup for iron deficiency anemia.  Patient admitted to the hospital 08/07/2024 to 08/11/2024 for symptomatic anemia.  Hemoglobin had dropped from 10-7.1 with black tarry stools.  Underwent EGD and colonoscopy, as well as video capsule endoscopy.  No definitive source of IDA found on the studies, though she did have 2 AVMs noted in the small bowel on VCE with no evidence of active GI bleeding, and recommendation was that if she does have recurrence of GI bleeding to consider single balloon enteroscopy.   She was advised to start oral iron supplementation and get set up for IV iron infusions as outpatient.  Did receive a transfusion with improvement in hemoglobin to 9.1.  Noted to be on Eliquis for a flutter which was temporarily held.  Found to have 10 polyps on colonoscopy and recommendation is for 1 year recall.  She has been undergoing iron infusions.  Discussed the use of AI scribe software for clinical note transcription with the patient, who gave verbal consent to proceed.  History of Present Illness       Previous GI Procedures/Imaging   Capsule endoscopy 08/09/2024 AVMs x 2 noted in the small bowel, no  evidence of active GI bleeding Patient has recurrent GI bleeding, single balloon enteroscopy should be considered if available  Colonoscopy 08/09/2024 - Hemorrhoids found on digital rectal exam.  - The examined portion of the ileum was normal.  - Ten 2 to 10 mm polyps at the recto- sigmoid colon, in the descending colon, in the transverse colon and in the ascending colon, removed with a cold snare. Resected and retrieved.  - Diverticulosis in the entire examined colon.  - Non- bleeding non- thrombosed external and internal hemorrhoids. - Repeat colonoscopy in a year for surveillance due to number of polyps found Path: A. ASCENDING, TRANSVERSE AND DESCENDING COLON, POLYPECTOMY:  Tubular adenoma, 7 fragments  Negative for high-grade dysplasia and carcinoma   EGD 08/08/2024 - No gross lesions in the entire esophagus. Z- line irregular, 39 cm from the incisors.  - Erythematous mucosa in the antrum. No other gross lesions in the entire stomach. Biopsied.  - No gross lesions in the duodenal bulb, in the first portion of the duodenum and in the second portion of the duodenum. Biopsied. Path: A. GASTRIC BIOPSY:  Gastric antral and oxyntic mucosa with focal changes consistent with  reactive gastropathy.  Negative for Helicobacter pylori.  Fragment of benign duodenal mucosa.   B. DUODENAL BIOPSY:  Duodenal mucosa with normal villous architecture.  No villous atrophy or increased intraepithelial lymphocytes.    EGD 03/2022: - LA Grade A reflux esophagitis with no bleeding. Biopsied.  - Mild gastritis. Biopsied.  - Normal examined duodenum. Biopsied.   1. Surgical [P], duodenum - DUODENAL MUCOSA WITH  NO SPECIFIC PATHOLOGIC DIAGNOSIS. - NORMAL LONG AND SLENDER VILLI, NEGATIVE FOR PATHOLOGIC INTRAEPITHELIAL LYMPHOCYTOSIS. 2. Surgical [P], gastric antrum and gastric body minimal gastritis - GASTRIC OXYNTOMUCINOUS AND OXYNTIC MUCOSA WITH NO SPECIFIC PATHOLOGIC DIAGNOSIS. - NEGATIVE FOR AN  INFLAMMATORY PATTERN PREDICTIVE OF HELICOBACTER PYLORI INFECTION. - NEGATIVE FOR INTESTINAL METAPLASIA AND MALIGNANCY. 3. Surgical [P], distal esophagus-esophagitis - REACTIVE SQUAMOCOLUMNAR MUCOSA WITH FEATURES CONSISTENT WITH REFLUX EFFECT (BASAL HYPERPLASIA, ELONGATED PAPILLAE, PARAKERATOSIS). - NEGATIVE FOR INTESTINAL METAPLASIA. - ACUTE INFLAMMATORY CELL ULCER EXUDATE. - NEGATIVE FOR MALIGNANCY. - NO VIRAL CYTOPATHIC CHANGE IDENTIFIED. - GMS STAIN FOR FUNGI PENDING, TO BE REPORTED IN AN ADDENDUM.    Past Medical History:  Diagnosis Date   AAA (abdominal aortic aneurysm)    AKI (acute kidney injury)    Arthritis    Chest pain 04/17/2016   Cholelithiasis 2011   s/p cholescystectomy    Chronic diastolic congestive heart failure (HCC)    COPD (chronic obstructive pulmonary disease) (HCC)    GERD (gastroesophageal reflux disease)    History of renal cell carcinoma    Hypercholesteremia    Hypertension    Hypertension    Mitral regurgitation    Nausea and vomiting 06/19/2022   Pneumonia 1980's   Renal cell carcinoma 01/2010   s/p right radical nephrectomy 01/2010,  followed by [facility]   S/P minimally invasive mitral valve repair 04/20/2019   Complex valvuloplasty including triangular resection of flail segment of posterior leaflet, artificial Gore-tex neochord placement x6 and 30 mm Sorin Memo 4D ring annuloplasty via right mini thoracotomy approach   Shortness of breath    Tuberculosis    Tests positive for PPD. dad had h/o TB.    Past Surgical History:  Procedure Laterality Date   ABDOMINAL AORTIC ENDOVASCULAR STENT GRAFT N/A 01/10/2022   Procedure: ABDOMINAL AORTIC ENDOVASCULAR STENT GRAFT;  Surgeon: Gretta Lonni PARAS, MD;  Location: [facility] OR;  Service: Vascular;  Laterality: N/A;   APPENDECTOMY  1970's   BUBBLE STUDY  03/18/2019   Procedure: BUBBLE STUDY;  Surgeon: Okey Vina GAILS, MD;  Location: [facility] ENDOSCOPY;  Service: Cardiovascular;;   CARDIAC CATHETERIZATION   09/2011   CHOLECYSTECTOMY  01/2010   COLONOSCOPY N/A 08/09/2024   Procedure: COLONOSCOPY;  Surgeon: Wilhelmenia Aloha Raddle., MD;  Location: [facility] ENDOSCOPY;  Service: Gastroenterology;  Laterality: N/A;   DIAGNOSTIC LAPAROSCOPY     ESOPHAGOGASTRODUODENOSCOPY N/A 08/08/2024   Procedure: EGD (ESOPHAGOGASTRODUODENOSCOPY);  Surgeon: Wilhelmenia Aloha Raddle., MD;  Location: [facility];  Service: Gastroenterology;  Laterality: N/A;   GIVENS CAPSULE STUDY N/A 08/09/2024   Procedure: IMAGING PROCEDURE, GI TRACT, INTRALUMINAL, VIA CAPSULE;  Surgeon: Mansouraty, Aloha Raddle., MD;  Location: [facility] ENDOSCOPY;  Service: Gastroenterology;  Laterality: N/A;   KIDNEY SURGERY     LEFT AND RIGHT HEART CATHETERIZATION WITH CORONARY ANGIOGRAM N/A 09/30/2011   Procedure: LEFT AND RIGHT HEART CATHETERIZATION WITH CORONARY ANGIOGRAM;  Surgeon: Oneil Parchment, MD;  Location: [facility] CATH LAB;  Service: Cardiovascular;  Laterality: N/A;   MITRAL VALVE REPAIR Right 04/20/2019   Procedure: MINIMALLY INVASIVE MITRAL VALVE REPAIR (MVR) USING MEMO 4D SIZE 30;  Surgeon: Dusty Sudie DEL, MD;  Location: [facility] OR;  Service: Open Heart Surgery;  Laterality: Right;   MULTIPLE EXTRACTIONS WITH ALVEOLOPLASTY  10/06/2011   Procedure: MULTIPLE EXTRACION WITH ALVEOLOPLASTY;  Surgeon: Tanda JULIANNA Fanny, DDS;  Location: [facility] OR;  Service: Oral Surgery;  Laterality: N/A;  Multiple extraction of tooth #'s 1, 6, 8, 10, 11, 22, 23, 26, 27, 28, 29 with alveoloplasty and Upper right buccal  exostoses reductions.   NEPHRECTOMY RADICAL  01/2010   right    ORIF WRIST FRACTURE Right 02/17/2022   Procedure: OPEN REDUCTION INTERNAL FIXATION (ORIF) WRIST FRACTURE;  Surgeon: Shari Easter, MD;  Location: [facility] OR;  Service: Orthopedics;  Laterality: Right;  with IV sedation   OVARIAN CYST REMOVAL  1970's   went through belly button   RIGHT HEART CATH N/A 07/20/2024   Procedure: RIGHT HEART CATH;  Surgeon: Cherrie Toribio SAUNDERS, MD;  Location: [facility] INVASIVE CV LAB;  Service:  Cardiovascular;  Laterality: N/A;   RIGHT/LEFT HEART CATH AND CORONARY ANGIOGRAPHY N/A 03/22/2019   Procedure: RIGHT/LEFT HEART CATH AND CORONARY ANGIOGRAPHY;  Surgeon: Dann Candyce RAMAN, MD;  Location: [facility] INVASIVE CV LAB;  Service: Cardiovascular;  Laterality: N/A;   TEE WITHOUT CARDIOVERSION  10/01/2011   Procedure: TRANSESOPHAGEAL ECHOCARDIOGRAM (TEE);  Surgeon: Candyce CANDIE Dann;  Location: [facility] ENDOSCOPY;  Service: Cardiovascular;  Laterality: N/A;   TEE WITHOUT CARDIOVERSION N/A 03/18/2019   Procedure: TRANSESOPHAGEAL ECHOCARDIOGRAM (TEE);  Surgeon: Okey Vina GAILS, MD;  Location: [facility] ENDOSCOPY;  Service: Cardiovascular;  Laterality: N/A;   TEE WITHOUT CARDIOVERSION N/A 04/20/2019   Procedure: TRANSESOPHAGEAL ECHOCARDIOGRAM (TEE);  Surgeon: Dusty Sudie DEL, MD;  Location: [facility] OR;  Service: Open Heart Surgery;  Laterality: N/A;   TUBAL LIGATION  1970's   US  ECHOCARDIOGRAPHY  09/2011    Current Outpatient Medications  Medication Sig Dispense Refill   albuterol  (VENTOLIN  HFA) 108 (90 Base) MCG/ACT inhaler Inhale 2 puffs into the lungs every 6 (six) hours as needed for wheezing or shortness of breath. 8 g 5   apixaban (ELIQUIS) 5 MG TABS tablet Take 1 tablet (5 mg total) by mouth 2 (two) times daily.     diltiazem (CARDIZEM CD) 120 MG 24 hr capsule Take 1 capsule (120 mg total) by mouth daily. 30 capsule 0   escitalopram (LEXAPRO) 10 MG tablet Take 10 mg by mouth daily.     ferrous sulfate 325 (65 FE) MG tablet Take 1 tablet (325 mg total) by mouth daily with breakfast. 30 tablet 0   Fluticasone -Umeclidin-Vilant (TRELEGY ELLIPTA ) 200-62.5-25 MCG/ACT AEPB Inhale 1 puff into the lungs daily. 2 each 0   furosemide  (LASIX ) 40 MG tablet Take 1 tablet (40 mg total) by mouth daily as needed for edema or fluid (For weight gain > 2 pounds in 24 hours or > 5 pounds in a week). 30 tablet 0   guaiFENesin  (MUCINEX ) 600 MG 12 hr tablet Take 2 tablets (1,200 mg total) by mouth 2 (two) times daily as needed.      levalbuterol  (XOPENEX ) 0.63 MG/3ML nebulizer solution Take 3 mLs (0.63 mg total) by nebulization every 2 (two) hours as needed for wheezing or shortness of breath. 150 mL 0   loratadine (CLARITIN) 10 MG tablet Take 1 tablet (10 mg total) by mouth daily. 30 tablet 0   losartan  (COZAAR ) 25 MG tablet Take 1 tablet (25 mg total) by mouth daily. 30 tablet 0   methimazole (TAPAZOLE) 5 MG tablet Take 1 tablet (5 mg total) by mouth 2 (two) times daily. 60 tablet 0   nicotine  (NICODERM CQ  - DOSED IN MG/24 HOURS) 14 mg/24hr patch Place 1 patch (14 mg total) onto the skin daily. 28 patch 0   pantoprazole  (PROTONIX ) 40 MG tablet Take 1 tablet (40 mg total) by mouth daily. 30 tablet 5   rosuvastatin  (CRESTOR ) 40 MG tablet Take 40 mg by mouth daily.     No current facility-administered medications for this visit.  Allergies as of 08/29/2024   (No Known Allergies)    Family History  Problem Relation Age of Onset   COPD Mother        DECEASED/SMOKED   Diabetes Brother    Diabetes Brother    Colon cancer Neg Hx    Rectal cancer Neg Hx    Esophageal cancer Neg Hx    Stomach cancer Neg Hx     Social History   Tobacco Use   Smoking status: Former    Current packs/day: 0.00    Average packs/day: 0.5 packs/day for 53.0 years (26.5 ttl pk-yrs)    Types: Cigarettes    Start date: 03/17/1966    Quit date: 03/18/2019    Years since quitting: 5.4    Passive exposure: Current (Son who lives with her Smokes)   Smokeless tobacco: Never  Vaping Use   Vaping status: Never Used  Substance Use Topics   Alcohol use: Not Currently   Drug use: Yes    Types: Cocaine    Comment: 04/28/2024     Review of Systems:    Constitutional: No weight loss, fever, chills, weakness or fatigue Eyes: No change in vision Ears, Nose, Throat:  No change in hearing or congestion Skin: No rash or itching Cardiovascular: No chest pain, chest pressure or palpitations   Respiratory: No SOB or cough Gastrointestinal:  See HPI and otherwise negative Genitourinary: No dysuria or change in urinary frequency Neurological: No headache, dizziness or syncope Musculoskeletal: No new muscle or joint pain Hematologic: No bleeding or bruising    Physical Exam:  Vital signs: There were no vitals taken for this visit.  Constitutional: NAD, Well developed, Well nourished, alert and cooperative Head:  Normocephalic and atraumatic.  Eyes: No scleral icterus. Conjunctiva pink. Mouth: No oral lesions. Respiratory: Respirations even and unlabored. Lungs clear to auscultation bilaterally.  No wheezes, crackles, or rhonchi.  Cardiovascular:  Regular rate and rhythm. No murmurs. No peripheral edema. Gastrointestinal:  Soft, nondistended, nontender. No rebound or guarding. Normal bowel sounds. No appreciable masses or hepatomegaly. Rectal:  Not performed.  Neurologic:  Alert and oriented x4;  grossly normal neurologically.  Skin:   Dry and intact without significant lesions or rashes. Psychiatric: Oriented to person, place and time. Demonstrates good judgement and reason without abnormal affect or behaviors.   RELEVANT LABS AND IMAGING: CBC    Component Value Date/Time   WBC 6.5 08/11/2024 0601   RBC 3.07 (L) 08/11/2024 0601   HGB 9.1 (L) 08/11/2024 0601   HGB 9.4 (L) 08/03/2024 1524   HCT 28.7 (L) 08/11/2024 0601   HCT 29.0 (L) 08/03/2024 1524   PLT 134 (L) 08/11/2024 0601   PLT 207 08/03/2024 1524   MCV 93.5 08/11/2024 0601   MCV 92 08/03/2024 1524   MCH 29.6 08/11/2024 0601   MCHC 31.7 08/11/2024 0601   RDW 16.2 (H) 08/11/2024 0601   RDW 13.2 08/03/2024 1524   LYMPHSABS 1.2 07/16/2024 1940   MONOABS 0.7 07/16/2024 1940   EOSABS 0.1 07/16/2024 1940   BASOSABS 0.0 07/16/2024 1940    CMP     Component Value Date/Time   NA 140 08/11/2024 0601   NA 140 08/03/2024 1513   K 4.0 08/11/2024 0601   CL 108 08/11/2024 0601   CO2 23 08/11/2024 0601   GLUCOSE 88 08/11/2024 0601   BUN 24 (H) 08/11/2024 0601    BUN 21 08/03/2024 1513   CREATININE 1.33 (H) 08/11/2024 0601   CREATININE 1.81 (H) 06/03/2016 1631  CALCIUM  8.7 (L) 08/11/2024 0601   PROT 5.2 (L) 08/08/2024 0507   ALBUMIN  3.2 (L) 08/08/2024 0507   AST 14 (L) 08/08/2024 0507   ALT 17 08/08/2024 0507   ALKPHOS 50 08/08/2024 0507   BILITOT 0.4 08/08/2024 0507   GFRNONAA 41 (L) 08/11/2024 0601   GFRAA 37 (L) 04/26/2020 0246     Assessment/Plan:    Assessment and Plan Assessment & Plan        Camie Furbish, PA-C [facility] 08/29/2024, 8:02 AM  Patient Care Team: [name], MD as PCP - General (Family Medicine) Raford Riggs, MD as PCP - Cardiology (Cardiology) Comer, Lamar ORN, MD (Inactive) (Internal Medicine) Raford Riggs, MD as Attending Physician (Cardiology) Tobie Gordy POUR, MD as Consulting Physician (Nephrology) Gladis Elsie JAYSON DEVONNA (Physician Assistant) Pandora Cadet, [facility] as Pharmacist (Pharmacist) Pandora Cadet, [facility] as Pharmacist (Pharmacist)

## 2024-08-31 ENCOUNTER — Ambulatory Visit (HOSPITAL_COMMUNITY): Payer: Medicare (Managed Care)

## 2024-09-01 ENCOUNTER — Ambulatory Visit (HOSPITAL_COMMUNITY): Payer: Medicare (Managed Care)

## 2024-09-05 ENCOUNTER — Encounter (HOSPITAL_COMMUNITY): Payer: Self-pay | Admitting: Internal Medicine

## 2024-09-06 NOTE — Progress Notes (Deleted)
 Cardiology Office Note    Date:  09/06/2024  ID:  Katie [name], DOB 1946/09/20, MRN [id number] PCP:  Katie Leni Edyth DELENA, MD  Cardiologist:  Annabella Scarce, MD  Electrophysiologist:  None   Chief Complaint: ***  History of Present Illness: Katie [name]    Katie [name] is a 78 y.o. female with visit-pertinent history of MVP and severe MR s/p MV repair 2020 with mitral stenosis, nonobstructive CAD, tobacco, cocaine use, chronic HFpEF, carotid artery disease (1-39% BICA), HTN, HLD, COPD, renal cell carcinoma s/p right nephrectomy 2011, CKD IIIb, brief NSVT/PSVT, tobacco use, AAA s/p stent graft 2023, prior post-op afib, recent paroxysmal atrial flutter, small pericardial effusion (not seen 06/2024) anemia seen for follow-up.    She underwent pre-MVR cath 03/2019 with minimal nonobstructive CAD (25% pLAD, 25% p-mCx). She underwent complex MV repair in 04/2019. Post-op course was notable for post-op atrial fibrillation requiring amiodarone  and Coumadin . Amiodarone  was discontinued in follow-up as she was maintaining normal rhythm. It is not clear at what point Coumadin  was stopped as she was then lost to follow-up after surgery until another office evaluation in 2022 for syncope. Monitor showed NSR, avg 73bpm, range 46-267bpm with 2 NSVT (longest 5 beats), 10 PSVT (longest 12.4 sec), rare PACs/PVCs. She was again lost to follow-up until seen for pre-op evaluation in 2023 at the request of vascular surgery for AAA stent graft in 2023. Echo showed EF 65-70%, LVOT peak gradient , mildly reduced RV function, moderate LAE, small pericardial effusion, s/p MV repair with moderate mitral stenosis. She underwent AAA surgery without acute complication.   She has had several admissions in 2025: - 02/2024 for AKI in setting of GI illness and ongoing cocaine use - 9/27-10/6/25 for acute respiratory failure with hypoxia felt due to viral PNA/rhinovirus with AECOPD, acute on chronic HFpEF, cocaine use, hyperthyroidism,  and iron deficiency anemia. 2d echo showed EF >75%, elevated LVOT velocity of 4.28m/s increasing to 4.84 m/s with valsalva, mild LVH, mildly dilated LA, mild MR, severe MS. She was briefly diuresed but developed mild AKI so this was discontinued. Due to persistent dyspnea, she ultimately underwent [facility] 07/20/24 with mild nonobstructive CAD, normal filling pressures, very mild MS. Symptoms therefore felt primarily pulmonary in etiology. She also developed brief atrial flutter on telemetry so was started on diltiazem and Eliquis. - 10/19-10/23/25 for symptomatic anemia (identified as OP prompting holding of Eliquis, patient initially reluctant to proceed to ER then went) - note that anemia was present in 06/2024 admission even before anticoagulation started  - GI workup mostly unremarkable aside from slight gastritis. Started on IV iron, granted clearance to restart Eliquis. Also treated with steroid taper for COPD.  Does not appear to have attended originally scheduled f/us .    Chronic HFpEF Mitral valve disease s/p MV repair with mitral stenosis Paroxysmal atrial flutter, remote hx of atrial fibrillation Nonobstructive CAD, HLD   Labwork independently reviewed: 07/2024 Hgb 9.1, plt 134, K 4.0, Cr 1.33, BNP 913 06/2024 AST/ALT wnl, TSH 0.284, free T4 up, T3 wnl, Mg 3.0, ESR 60, pBNP 7k    ROS: .    Please see the history of present illness. Otherwise, review of systems is positive for ***.  All other systems are reviewed and otherwise negative.  Studies Reviewed: Katie [name]    EKG:  EKG is ordered today, personally reviewed, demonstrating ***  CV Studies: Cardiac studies reviewed are outlined and summarized above. Otherwise please see EMR for full report.   Current Reported Medications:.  No outpatient medications have been marked as taking for the 09/07/24 encounter (Appointment) with Lateefah Mallery N, PA-C.    Physical Exam:    VS:  There were no vitals taken for this visit.   Wt Readings  from Last 3 Encounters:  08/09/24 138 lb 14.2 oz (63 kg)  08/03/24 139 lb (63 kg)  07/28/24 138 lb 12.8 oz (63 kg)    GEN: Well nourished, well developed in no acute distress NECK: No JVD; No carotid bruits CARDIAC: ***RRR, no murmurs, rubs, gallops RESPIRATORY:  Clear to auscultation without rales, wheezing or rhonchi  ABDOMEN: Soft, non-tender, non-distended EXTREMITIES:  No edema; No acute deformity   Asessement and Plan:.     ***     Disposition: F/u with ***  Signed, Weston Fulco N Abdulla Pooley, PA-C

## 2024-09-07 ENCOUNTER — Ambulatory Visit: Payer: Medicare (Managed Care) | Attending: Physician Assistant | Admitting: Physician Assistant

## 2024-09-07 DIAGNOSIS — Z9889 Other specified postprocedural states: Secondary | ICD-10-CM

## 2024-09-07 DIAGNOSIS — I4892 Unspecified atrial flutter: Secondary | ICD-10-CM

## 2024-09-07 DIAGNOSIS — I5032 Chronic diastolic (congestive) heart failure: Secondary | ICD-10-CM

## 2024-09-07 DIAGNOSIS — I2510 Atherosclerotic heart disease of native coronary artery without angina pectoris: Secondary | ICD-10-CM

## 2024-09-07 DIAGNOSIS — I050 Rheumatic mitral stenosis: Secondary | ICD-10-CM

## 2024-09-07 DIAGNOSIS — I480 Paroxysmal atrial fibrillation: Secondary | ICD-10-CM

## 2024-09-07 DIAGNOSIS — E785 Hyperlipidemia, unspecified: Secondary | ICD-10-CM

## 2024-09-13 NOTE — Progress Notes (Deleted)
 Cardiology Office Note    Date:  09/13/2024  ID:  Katie [name], DOB 1946-09-11, MRN [id number] PCP:  [name], MD  Cardiologist:  Annabella Scarce, MD  Electrophysiologist:  None   Chief Complaint: ***  History of Present Illness: Katie [name]    Katie [name] is a 78 y.o. female with visit-pertinent history of MVP and severe MR s/p MV repair 2020 with mitral stenosis, nonobstructive CAD, tobacco, cocaine  use, chronic HFpEF, carotid artery disease (1-39% BICA), HTN, HLD, COPD, renal cell carcinoma s/p right nephrectomy 2011, CKD IIIb, brief NSVT/PSVT, tobacco use, AAA s/p stent graft 2023, prior post-op afib, recent paroxysmal atrial flutter, small pericardial effusion (not seen 06/2024) anemia seen for follow-up.    She underwent pre-MVR cath 03/2019 with minimal nonobstructive CAD (25% pLAD, 25% p-mCx). She underwent complex MV repair in 04/2019. Post-op course was notable for post-op atrial fibrillation requiring amiodarone  and Coumadin . Amiodarone  was discontinued in follow-up as she was maintaining normal rhythm. It is not clear at what point Coumadin  was stopped as she was then lost to follow-up after surgery until another office evaluation in 2022 for syncope. Monitor showed NSR, avg 73bpm, range 46-267bpm with 2 NSVT (longest 5 beats), 10 PSVT (longest 12.4 sec), rare PACs/PVCs. She was again lost to follow-up until seen for pre-op evaluation in 2023 at the request of vascular surgery for AAA stent graft in 2023. Echo showed EF 65-70%, LVOT peak gradient , mildly reduced RV function, moderate LAE, small pericardial effusion, s/p MV repair with moderate mitral stenosis. She underwent AAA surgery without acute complication.   She has had several admissions in 2025: - 02/2024 for AKI in setting of GI illness and ongoing cocaine  use - 9/27-10/6/25 for acute respiratory failure with hypoxia felt due to viral PNA/rhinovirus with AECOPD, acute on chronic HFpEF, cocaine  use, hyperthyroidism,  and iron  deficiency anemia. 2d echo showed EF >75%, elevated LVOT velocity of 4.66m/s increasing to 4.84 m/s with valsalva, mild LVH, mildly dilated LA, mild MR, severe MS. She was briefly diuresed but developed mild AKI so this was discontinued. Due to persistent dyspnea, she ultimately underwent [facility] 07/20/24 with mild nonobstructive CAD, normal filling pressures, very mild MS. Symptoms therefore felt primarily pulmonary in etiology. She also developed brief atrial flutter on telemetry so was started on diltiazem  and Eliquis . - 10/19-10/23/25 for symptomatic anemia (identified as OP prompting holding of Eliquis , patient initially reluctant to proceed to ER then went) - note that anemia was present in 06/2024 admission even before anticoagulation started  - GI workup mostly unremarkable aside from slight gastritis. Started on IV iron , granted clearance to restart Eliquis . Also treated with steroid taper for COPD.  Does not appear to have attended originally scheduled f/us .    Chronic HFpEF Mitral valve disease s/p MV repair with mitral stenosis Paroxysmal atrial flutter, remote hx of atrial fibrillation Nonobstructive CAD, HLD   Labwork independently reviewed: 07/2024 Hgb 9.1, plt 134, K 4.0, Cr 1.33, BNP 913 06/2024 AST/ALT wnl, TSH 0.284, free T4 up, T3 wnl, Mg 3.0, ESR 60, pBNP 7k    ROS: .    Please see the history of present illness. Otherwise, review of systems is positive for ***.  All other systems are reviewed and otherwise negative.  Studies Reviewed: Katie [name]    EKG:  EKG is ordered today, personally reviewed, demonstrating ***  CV Studies: Cardiac studies reviewed are outlined and summarized above. Otherwise please see EMR for full report.   Current Reported Medications:.  No outpatient medications have been marked as taking for the 09/14/24 encounter (Appointment) with Tenesia Escudero N, PA-C.    Physical Exam:    VS:  There were no vitals taken for this visit.   Wt Readings  from Last 3 Encounters:  08/09/24 138 lb 14.2 oz (63 kg)  08/03/24 139 lb (63 kg)  07/28/24 138 lb 12.8 oz (63 kg)    GEN: Well nourished, well developed in no acute distress NECK: No JVD; No carotid bruits CARDIAC: ***RRR, no murmurs, rubs, gallops RESPIRATORY:  Clear to auscultation without rales, wheezing or rhonchi  ABDOMEN: Soft, non-tender, non-distended EXTREMITIES:  No edema; No acute deformity   Asessement and Plan:.     ***     Disposition: F/u with ***  Signed, Jorje Vanatta N Bradon Fester, PA-C

## 2024-09-14 ENCOUNTER — Encounter: Payer: Self-pay | Admitting: Physician Assistant

## 2024-09-14 ENCOUNTER — Ambulatory Visit: Payer: Medicare (Managed Care) | Attending: Physician Assistant | Admitting: Physician Assistant

## 2024-09-14 DIAGNOSIS — I050 Rheumatic mitral stenosis: Secondary | ICD-10-CM

## 2024-09-14 DIAGNOSIS — I480 Paroxysmal atrial fibrillation: Secondary | ICD-10-CM

## 2024-09-14 DIAGNOSIS — E785 Hyperlipidemia, unspecified: Secondary | ICD-10-CM

## 2024-09-14 DIAGNOSIS — I2510 Atherosclerotic heart disease of native coronary artery without angina pectoris: Secondary | ICD-10-CM

## 2024-09-14 DIAGNOSIS — Z9889 Other specified postprocedural states: Secondary | ICD-10-CM

## 2024-09-14 DIAGNOSIS — I5032 Chronic diastolic (congestive) heart failure: Secondary | ICD-10-CM

## 2024-09-14 DIAGNOSIS — I4892 Unspecified atrial flutter: Secondary | ICD-10-CM

## 2024-09-27 ENCOUNTER — Emergency Department (HOSPITAL_COMMUNITY): Payer: Medicare (Managed Care)

## 2024-09-27 ENCOUNTER — Emergency Department (HOSPITAL_COMMUNITY)
Admission: EM | Admit: 2024-09-27 | Discharge: 2024-09-27 | Disposition: A | Discharge status: Home / Self Care | Payer: Medicare (Managed Care) | Attending: Emergency Medicine | Admitting: Emergency Medicine

## 2024-09-27 ENCOUNTER — Other Ambulatory Visit: Payer: Self-pay

## 2024-09-27 DIAGNOSIS — Z7901 Long term (current) use of anticoagulants: Secondary | ICD-10-CM | POA: Diagnosis not present

## 2024-09-27 DIAGNOSIS — E876 Hypokalemia: Secondary | ICD-10-CM | POA: Insufficient documentation

## 2024-09-27 DIAGNOSIS — K219 Gastro-esophageal reflux disease without esophagitis: Secondary | ICD-10-CM | POA: Insufficient documentation

## 2024-09-27 DIAGNOSIS — M4856XA Collapsed vertebra, not elsewhere classified, lumbar region, initial encounter for fracture: Secondary | ICD-10-CM | POA: Insufficient documentation

## 2024-09-27 DIAGNOSIS — K5730 Diverticulosis of large intestine without perforation or abscess without bleeding: Secondary | ICD-10-CM | POA: Insufficient documentation

## 2024-09-27 DIAGNOSIS — Z79899 Other long term (current) drug therapy: Secondary | ICD-10-CM | POA: Diagnosis not present

## 2024-09-27 DIAGNOSIS — I4892 Unspecified atrial flutter: Secondary | ICD-10-CM | POA: Diagnosis not present

## 2024-09-27 DIAGNOSIS — E860 Dehydration: Secondary | ICD-10-CM | POA: Insufficient documentation

## 2024-09-27 DIAGNOSIS — R5383 Other fatigue: Secondary | ICD-10-CM | POA: Diagnosis not present

## 2024-09-27 DIAGNOSIS — Z85528 Personal history of other malignant neoplasm of kidney: Secondary | ICD-10-CM | POA: Diagnosis not present

## 2024-09-27 DIAGNOSIS — I110 Hypertensive heart disease with heart failure: Secondary | ICD-10-CM | POA: Diagnosis not present

## 2024-09-27 DIAGNOSIS — R112 Nausea with vomiting, unspecified: Secondary | ICD-10-CM | POA: Diagnosis present

## 2024-09-27 DIAGNOSIS — Z7951 Long term (current) use of inhaled steroids: Secondary | ICD-10-CM | POA: Insufficient documentation

## 2024-09-27 DIAGNOSIS — I509 Heart failure, unspecified: Secondary | ICD-10-CM | POA: Insufficient documentation

## 2024-09-27 LAB — URINALYSIS, ROUTINE W REFLEX MICROSCOPIC
Bilirubin Urine: NEGATIVE
Glucose, UA: NEGATIVE mg/dL
Hgb urine dipstick: NEGATIVE
Ketones, ur: 5 mg/dL — AB
Nitrite: NEGATIVE
Protein, ur: 30 mg/dL — AB
Specific Gravity, Urine: 1.023 (ref 1.005–1.030)
pH: 5 (ref 5.0–8.0)

## 2024-09-27 LAB — CBC
HCT: 41.2 % (ref 36.0–46.0)
Hemoglobin: 13.8 g/dL (ref 12.0–15.0)
MCH: 29.1 pg (ref 26.0–34.0)
MCHC: 33.5 g/dL (ref 30.0–36.0)
MCV: 86.7 fL (ref 80.0–100.0)
Platelets: 330 K/uL (ref 150–400)
RBC: 4.75 MIL/uL (ref 3.87–5.11)
RDW: 13.2 % (ref 11.5–15.5)
WBC: 10 K/uL (ref 4.0–10.5)
nRBC: 0 % (ref 0.0–0.2)

## 2024-09-27 LAB — COMPREHENSIVE METABOLIC PANEL WITH GFR
ALT: 16 U/L (ref 0–44)
AST: 24 U/L (ref 15–41)
Albumin: 4.3 g/dL (ref 3.5–5.0)
Alkaline Phosphatase: 87 U/L (ref 38–126)
Anion gap: 15 (ref 5–15)
BUN: 31 mg/dL — ABNORMAL HIGH (ref 8–23)
CO2: 26 mmol/L (ref 22–32)
Calcium: 10.2 mg/dL (ref 8.9–10.3)
Chloride: 92 mmol/L — ABNORMAL LOW (ref 98–111)
Creatinine, Ser: 1.66 mg/dL — ABNORMAL HIGH (ref 0.44–1.00)
GFR, Estimated: 32 mL/min — ABNORMAL LOW (ref >60)
Glucose, Bld: 133 mg/dL — ABNORMAL HIGH (ref 70–99)
Potassium: 3.2 mmol/L — ABNORMAL LOW (ref 3.5–5.1)
Sodium: 133 mmol/L — ABNORMAL LOW (ref 135–145)
Total Bilirubin: 0.5 mg/dL (ref 0.0–1.2)
Total Protein: 7.4 g/dL (ref 6.5–8.1)

## 2024-09-27 LAB — RESP PANEL BY RT-PCR (RSV, FLU A&B, COVID)  RVPGX2
Influenza A by PCR: NEGATIVE
Influenza B by PCR: NEGATIVE
Resp Syncytial Virus by PCR: NEGATIVE
SARS Coronavirus 2 by RT PCR: NEGATIVE

## 2024-09-27 LAB — LIPASE, BLOOD: Lipase: 38 U/L (ref 11–51)

## 2024-09-27 LAB — MAGNESIUM: Magnesium: 2 mg/dL (ref 1.7–2.4)

## 2024-09-27 MED ORDER — IOHEXOL 300 MG/ML  SOLN
75.0000 mL | Freq: Once | INTRAMUSCULAR | Status: AC | PRN
  Administered 2024-09-27: 60 mL via INTRAVENOUS

## 2024-09-27 MED ORDER — ONDANSETRON 4 MG PO TBDP: 4.0000 mg | ORAL_TABLET | Freq: Three times a day (TID) | ORAL | 0 refills | Status: DC | PRN

## 2024-09-27 MED ORDER — PANTOPRAZOLE SODIUM 40 MG PO TBEC: 40.0000 mg | DELAYED_RELEASE_TABLET | Freq: Every day | ORAL | 0 refills | Status: AC

## 2024-09-27 MED ORDER — METOCLOPRAMIDE HCL 5 MG/ML IJ SOLN
10.0000 mg | Freq: Once | INTRAMUSCULAR | Status: AC
  Administered 2024-09-27: 10 mg via INTRAVENOUS
  Filled 2024-09-27: qty 2

## 2024-09-27 MED ORDER — POTASSIUM CHLORIDE 10 MEQ/100ML IV SOLN
10.0000 meq | Freq: Once | INTRAVENOUS | Status: AC
  Administered 2024-09-27: 10 meq via INTRAVENOUS
  Filled 2024-09-27: qty 100

## 2024-09-27 MED ORDER — ONDANSETRON HCL 4 MG/2ML IJ SOLN
4.0000 mg | Freq: Once | INTRAMUSCULAR | Status: AC
  Administered 2024-09-27: 4 mg via INTRAVENOUS
  Filled 2024-09-27: qty 2

## 2024-09-27 MED ORDER — PANTOPRAZOLE SODIUM 40 MG IV SOLR
40.0000 mg | Freq: Once | INTRAVENOUS | Status: AC
  Administered 2024-09-27: 40 mg via INTRAVENOUS
  Filled 2024-09-27: qty 10

## 2024-09-27 MED ORDER — ALUM & MAG HYDROXIDE-SIMETH 200-200-20 MG/5ML PO SUSP
30.0000 mL | Freq: Once | ORAL | Status: AC
  Administered 2024-09-27: 30 mL via ORAL
  Filled 2024-09-27: qty 30

## 2024-09-27 MED ORDER — DIPHENHYDRAMINE HCL 50 MG/ML IJ SOLN
12.5000 mg | Freq: Once | INTRAMUSCULAR | Status: AC
  Administered 2024-09-27: 12.5 mg via INTRAVENOUS
  Filled 2024-09-27: qty 1

## 2024-09-27 MED ORDER — SODIUM CHLORIDE 0.9 % IV BOLUS
1000.0000 mL | Freq: Once | INTRAVENOUS | Status: AC
  Administered 2024-09-27: 1000 mL via INTRAVENOUS

## 2024-09-27 NOTE — ED Notes (Signed)
..  The patient is A&OX4, ambulatory at d/c with independent steady gait, but wheeled out of ED via wheelchair. Pt verbalized understanding of d/c instructions and follow up care. She reports her son will be driving her home.

## 2024-09-27 NOTE — ED Provider Notes (Signed)
 [place] EMERGENCY DEPARTMENT AT [facility] Provider Note   CSN: [id number] Arrival date & time: 09/27/24  1459     Patient presents with: Emesis and Fatigue   Katie [name] is a 78 y.o. female.   Pt is a 78 yo female with pmhx significant for arthritis, htn, hld, renal cell carcinoma, gerd, copd, htn, chf, atrial flutter (on Eliquis ) and hx AAA.  Pt said she has been vomiting for 4 days.  She is unable to keep anything down.  She feels like she has a lot of stomach acid.  She denies f/c.  No diarrhea.  She has been very tired.       Prior to Admission medications   Medication Sig Start Date End Date Taking? Authorizing Provider  ondansetron  (ZOFRAN -ODT) 4 MG disintegrating tablet Take 1 tablet (4 mg total) by mouth every 8 (eight) hours as needed. 09/27/24  Yes Dean Clarity, MD  albuterol  (VENTOLIN  HFA) 108 [phone] Base) MCG/ACT inhaler Inhale 2 puffs into the lungs every 6 (six) hours as needed for wheezing or shortness of breath. 02/08/20   Gladis Elsie BROCKS, PA-C  apixaban  (ELIQUIS ) 5 MG TABS tablet Take 1 tablet (5 mg total) by mouth 2 (two) times daily. 08/13/24   Vann, Jessica U, DO  diltiazem  (CARDIZEM  CD) 120 MG 24 hr capsule Take 1 capsule (120 mg total) by mouth daily. 07/26/24   Vann, Jessica U, DO  escitalopram (LEXAPRO) 10 MG tablet Take 10 mg by mouth daily. 07/29/24   [provider]  ferrous sulfate  325 (65 FE) MG tablet Take 1 tablet (325 mg total) by mouth daily with breakfast. 08/12/24   Juvenal Harlene PENNER, DO  Fluticasone -Umeclidin-Vilant (TRELEGY ELLIPTA ) 200-62.5-25 MCG/ACT AEPB Inhale 1 puff into the lungs daily. 03/05/23   Hunsucker, Donnice SAUNDERS, MD  furosemide  (LASIX ) 40 MG tablet Take 1 tablet (40 mg total) by mouth daily as needed for edema or fluid (For weight gain > 2 pounds in 24 hours or > 5 pounds in a week). 07/28/24   Hunsucker, Donnice SAUNDERS, MD  guaiFENesin  (MUCINEX ) 600 MG 12 hr tablet Take 2 tablets (1,200 mg total) by mouth 2 (two) times  daily as needed. 08/11/24   Vann, Jessica U, DO  levalbuterol  (XOPENEX ) 0.63 MG/3ML nebulizer solution Take 3 mLs (0.63 mg total) by nebulization every 2 (two) hours as needed for wheezing or shortness of breath. 07/25/24   Vann, Jessica U, DO  loratadine  (CLARITIN ) 10 MG tablet Take 1 tablet (10 mg total) by mouth daily. 07/26/24   Vann, Jessica U, DO  losartan  (COZAAR ) 25 MG tablet Take 1 tablet (25 mg total) by mouth daily. 07/26/24   Juvenal Harlene PENNER, DO  methimazole  (TAPAZOLE ) 5 MG tablet Take 1 tablet (5 mg total) by mouth 2 (two) times daily. 07/25/24   Vann, Jessica U, DO  nicotine  (NICODERM CQ  - DOSED IN MG/24 HOURS) 14 mg/24hr patch Place 1 patch (14 mg total) onto the skin daily. 07/26/24   Vann, Jessica U, DO  pantoprazole  (PROTONIX ) 40 MG tablet Take 1 tablet (40 mg total) by mouth daily. 09/27/24   Rudolph Daoust, MD  rosuvastatin  (CRESTOR ) 40 MG tablet Take 40 mg by mouth daily. 06/11/22   [provider]    Allergies: Patient has no known allergies.    Review of Systems  Constitutional:  Positive for fatigue.  Gastrointestinal:  Positive for abdominal pain, nausea and vomiting.  All other systems reviewed and are negative.   Updated Vital Signs BP 104/73  Pulse 78   Temp 98.6 F (37 C) (Oral)   Resp 17   SpO2 100%   Physical Exam Vitals and nursing note reviewed.  Constitutional:      Appearance: Normal appearance.  HENT:     Head: Normocephalic and atraumatic.     Right Ear: External ear normal.     Left Ear: External ear normal.     Nose: Nose normal.     Mouth/Throat:     Mouth: Mucous membranes are dry.  Eyes:     Extraocular Movements: Extraocular movements intact.     Conjunctiva/sclera: Conjunctivae normal.     Pupils: Pupils are equal, round, and reactive to light.  Cardiovascular:     Rate and Rhythm: Normal rate and regular rhythm.     Pulses: Normal pulses.     Heart sounds: Normal heart sounds.  Pulmonary:     Effort: Pulmonary effort is  normal.     Breath sounds: Normal breath sounds.  Abdominal:     General: Abdomen is flat. Bowel sounds are normal.     Palpations: Abdomen is soft.  Musculoskeletal:        General: Normal range of motion.     Cervical back: Normal range of motion and neck supple.  Skin:    General: Skin is warm.     Capillary Refill: Capillary refill takes less than 2 seconds.  Neurological:     General: No focal deficit present.     Mental Status: She is alert and oriented to person, place, and time.  Psychiatric:        Mood and Affect: Mood normal.        Behavior: Behavior normal.        Thought Content: Thought content normal.        Judgment: Judgment normal.     (all labs ordered are listed, but only abnormal results are displayed) Labs Reviewed  COMPREHENSIVE METABOLIC PANEL WITH GFR - Abnormal; Notable for the following components:      Result Value   Sodium 133 (*)    Potassium 3.2 (*)    Chloride 92 (*)    Glucose, Bld 133 (*)    BUN 31 (*)    Creatinine, Ser 1.66 (*)    GFR, Estimated 32 (*)    All other components within normal limits  URINALYSIS, ROUTINE W REFLEX MICROSCOPIC - Abnormal; Notable for the following components:   APPearance HAZY (*)    Ketones, ur 5 (*)    Protein, ur 30 (*)    Leukocytes,Ua TRACE (*)    Bacteria, UA RARE (*)    All other components within normal limits  RESP PANEL BY RT-PCR (RSV, FLU A&B, COVID)  RVPGX2  LIPASE, BLOOD  CBC  MAGNESIUM     EKG: EKG Interpretation Date/Time:  Tuesday September 27 2024 15:47:58 EST Ventricular Rate:  80 PR Interval:  120 QRS Duration:  95 QT Interval:  420 QTC Calculation: 485 R Axis:   2  Text Interpretation: Sinus rhythm Atrial premature complex Probable left ventricular hypertrophy  PACs are new Confirmed by Dean Clarity [id number]) on 09/27/2024 4:32:17 PM  Radiology: CT ABDOMEN PELVIS W CONTRAST Result Date: 09/27/2024 CLINICAL DATA:  Abdominal pain.  Vomiting and weakness. EXAM: CT ABDOMEN AND  PELVIS WITH CONTRAST TECHNIQUE: Multidetector CT imaging of the abdomen and pelvis was performed using the standard protocol following bolus administration of intravenous contrast. RADIATION DOSE REDUCTION: This exam was performed according to the departmental dose-optimization program which includes  automated exposure control, adjustment of the mA and/or kV according to patient size and/or use of iterative reconstruction technique. CONTRAST:  60mL OMNIPAQUE  IOHEXOL  300 MG/ML  SOLN COMPARISON:  CTA abdomen/pelvis dated 06/01/2024 FINDINGS: Lower chest: No acute abnormality. Stable calcified plaque at the right lung base. Hepatobiliary: Stable 4.1 cm cyst in the right hepatic lobe. Additional scattered hypoattenuating foci within both lobes of the liver are also change unchanged and most compatible with cysts. Status post cholecystectomy. Similar increased caliber of the common bile duct measuring up to 8 mm. No intrahepatic biliary dilatation. Pancreas: Unremarkable. No pancreatic ductal dilatation or surrounding inflammatory changes. Spleen: Normal in size without focal abnormality. Adrenals/Urinary Tract: Similar thickening of the left adrenal gland without discrete mass. Right adrenal gland is not well visualized and may be surgically absent. Status post right nephrectomy. No left-sided renal calculi or hydronephrosis. Bladder is unremarkable. Stomach/Bowel: Stomach is within normal limits. Appendix appears normal. No evidence of bowel wall thickening, distention, or inflammatory changes. Mild left colonic diverticulosis. Vascular/Lymphatic: Infrarenal abdominal aortic aneurysm status post aorto bi-iliac stent graft repair. The aneurysm sac measures 4.2 cm (series 2, image 30), previously 4.2 cm. No evidence of acute dissection. No evidence of endoleak. No enlarged lymph nodes identified in the field of view. Reproductive: Uterus and bilateral adnexa are unremarkable. Other: No abdominopelvic ascites.  No  intraperitoneal free air. Musculoskeletal: No acute osseous abnormality. Chronic L3 compression fracture with advanced height loss. IMPRESSION: 1. No acute localizing findings in the abdomen or pelvis. 2. Status post aorto bi-iliac stent graft repair of an infrarenal abdominal aortic aneurysm with stable size of aneurysm sac measuring 4.2 cm. 3. Left colonic diverticulosis without evidence of acute diverticulitis. 4. Additional unchanged chronic findings, as described above. Electronically Signed   By: Harrietta Sherry M.D.   On: 09/27/2024 18:23     Procedures   Medications Ordered in the ED  sodium chloride  0.9 % bolus 1,000 mL (0 mLs Intravenous Stopped 09/27/24 2119)  ondansetron  (ZOFRAN ) injection 4 mg (4 mg Intravenous Given 09/27/24 1540)  pantoprazole  (PROTONIX ) injection 40 mg (40 mg Intravenous Given 09/27/24 1734)  potassium chloride  10 mEq in 100 mL IVPB (0 mEq Intravenous Stopped 09/27/24 1903)  iohexol  (OMNIPAQUE ) 300 MG/ML solution 75 mL (60 mLs Intravenous Contrast Given 09/27/24 1712)  metoCLOPramide  (REGLAN ) injection 10 mg (10 mg Intravenous Given 09/27/24 1904)  diphenhydrAMINE  (BENADRYL ) injection 12.5 mg (12.5 mg Intravenous Given 09/27/24 1905)  alum & mag hydroxide-simeth (MAALOX/MYLANTA) 200-200-20 MG/5ML suspension 30 mL (30 mLs Oral Given 09/27/24 1905)                                    Medical Decision Making Amount and/or Complexity of Data Reviewed Labs: ordered. Radiology: ordered.  Risk OTC drugs. Prescription drug management.   This patient presents to the ED for concern of n/v, this involves an extensive number of treatment options, and is a complaint that carries with it a high risk of complications and morbidity.  The differential diagnosis includes infection, anemia, electrolyte abn   Co morbidities that complicate the patient evaluation  arthritis, htn, hld, renal cell carcinoma, gerd, copd, htn, chf, atrial flutter (on Eliquis ) and hx  AAA   Additional history obtained:  Additional history obtained from epic chart review   Lab Tests:  I Ordered, and personally interpreted labs.  The pertinent results include:  cbc nl; cmp with k low at 3.2, bun  elevated at 31 and cr elevated at 1.66 (24 and 1.33 on 10/23); lip nl; UA + ketones/protein; covid/flu/rsv neg   Imaging Studies ordered:  I ordered imaging studies including ct abd/pelvis   I independently visualized and interpreted imaging which showed  1. No acute localizing findings in the abdomen or pelvis.  2. Status post aorto bi-iliac stent graft repair of an infrarenal  abdominal aortic aneurysm with stable size of aneurysm sac measuring  4.2 cm.  3. Left colonic diverticulosis without evidence of acute  diverticulitis.  4. Additional unchanged chronic findings, as described above.   I agree with the radiologist interpretation   Cardiac Monitoring:  The patient was maintained on a cardiac monitor.  I personally viewed and interpreted the cardiac monitored which showed an underlying rhythm of: nsr   Medicines ordered and prescription drug management:  I ordered medication including ivfs/zofran /reglan /benadryl   for sx  Reevaluation of the patient after these medicines showed that the patient improved I have reviewed the patients home medicines and have made adjustments as needed   Test Considered:  ct  Problem List / ED Course:  N/v:  pt is feeling much better after fluids and meds.  She has been having a lot of reflux lately and was on protonix , but has not been on it in awhile.  She is able to tolerate po fluids.  She is stable for d/c.  Return if worse.    Reevaluation:  After the interventions noted above, I reevaluated the patient and found that they have :improved   Social Determinants of Health:  Lives at home   Dispostion:  After consideration of the diagnostic results and the patients response to treatment, I feel that the patent  would benefit from discharge with outpatient f/u.       Final diagnoses:  Dehydration  Nausea and vomiting, unspecified vomiting type  Gastroesophageal reflux disease, unspecified whether esophagitis present    ED Discharge Orders          Ordered    ondansetron  (ZOFRAN -ODT) 4 MG disintegrating tablet  Every 8 hours PRN        09/27/24 2151    pantoprazole  (PROTONIX ) 40 MG tablet  Daily        09/27/24 2151               Dean Clarity, MD 09/27/24 2154

## 2024-09-27 NOTE — ED Triage Notes (Signed)
 PT arrives to triage with complaints vomiting and weakness for 4 days. Pt states that she can't keep anything down, and feels like she has a lot of stomach acid. Denies diarrhea, denies constipation.

## 2024-09-27 NOTE — ED Notes (Signed)
 Patient is resting comfortably in the bed. Patient was given something to drink and was able to keep it down. Patient states she feels much better.

## 2024-10-09 DIAGNOSIS — R002 Palpitations: Secondary | ICD-10-CM

## 2024-10-10 ENCOUNTER — Ambulatory Visit: Payer: Self-pay | Admitting: Physician Assistant

## 2024-10-10 NOTE — Progress Notes (Signed)
 Sent patient letter.

## 2024-11-11 ENCOUNTER — Emergency Department (HOSPITAL_COMMUNITY): Payer: Medicare (Managed Care)

## 2024-11-11 ENCOUNTER — Emergency Department (HOSPITAL_COMMUNITY)
Admission: EM | Admit: 2024-11-11 | Discharge: 2024-11-11 | Disposition: A | Discharge status: Home / Self Care | Payer: Medicare (Managed Care) | Attending: Emergency Medicine | Admitting: Emergency Medicine

## 2024-11-11 DIAGNOSIS — E878 Other disorders of electrolyte and fluid balance, not elsewhere classified: Secondary | ICD-10-CM | POA: Diagnosis not present

## 2024-11-11 DIAGNOSIS — R112 Nausea with vomiting, unspecified: Secondary | ICD-10-CM | POA: Insufficient documentation

## 2024-11-11 DIAGNOSIS — R109 Unspecified abdominal pain: Secondary | ICD-10-CM | POA: Diagnosis not present

## 2024-11-11 DIAGNOSIS — R944 Abnormal results of kidney function studies: Secondary | ICD-10-CM | POA: Insufficient documentation

## 2024-11-11 DIAGNOSIS — Z7901 Long term (current) use of anticoagulants: Secondary | ICD-10-CM | POA: Insufficient documentation

## 2024-11-11 LAB — CBC WITH DIFFERENTIAL/PLATELET
Abs Immature Granulocytes: 0.05 K/uL (ref 0.00–0.07)
Basophils Absolute: 0 K/uL (ref 0.0–0.1)
Basophils Relative: 0 %
Eosinophils Absolute: 0.1 K/uL (ref 0.0–0.5)
Eosinophils Relative: 1 %
HCT: 44.9 % (ref 36.0–46.0)
Hemoglobin: 14.7 g/dL (ref 12.0–15.0)
Immature Granulocytes: 1 %
Lymphocytes Relative: 14 %
Lymphs Abs: 1.4 K/uL (ref 0.7–4.0)
MCH: 28.5 pg (ref 26.0–34.0)
MCHC: 32.7 g/dL (ref 30.0–36.0)
MCV: 87 fL (ref 80.0–100.0)
Monocytes Absolute: 0.9 K/uL (ref 0.1–1.0)
Monocytes Relative: 10 %
Neutro Abs: 7.2 K/uL (ref 1.7–7.7)
Neutrophils Relative %: 74 %
Platelets: 290 K/uL (ref 150–400)
RBC: 5.16 MIL/uL — ABNORMAL HIGH (ref 3.87–5.11)
RDW: 12.6 % (ref 11.5–15.5)
WBC: 9.6 K/uL (ref 4.0–10.5)
nRBC: 0 % (ref 0.0–0.2)

## 2024-11-11 LAB — COMPREHENSIVE METABOLIC PANEL WITH GFR
ALT: 49 U/L — ABNORMAL HIGH (ref 0–44)
AST: 30 U/L (ref 15–41)
Albumin: 3.8 g/dL (ref 3.5–5.0)
Alkaline Phosphatase: 94 U/L (ref 38–126)
Anion gap: 10 (ref 5–15)
BUN: 35 mg/dL — ABNORMAL HIGH (ref 8–23)
CO2: 33 mmol/L — ABNORMAL HIGH (ref 22–32)
Calcium: 10 mg/dL (ref 8.9–10.3)
Chloride: 92 mmol/L — ABNORMAL LOW (ref 98–111)
Creatinine, Ser: 1.7 mg/dL — ABNORMAL HIGH (ref 0.44–1.00)
GFR, Estimated: 31 mL/min — ABNORMAL LOW
Glucose, Bld: 111 mg/dL — ABNORMAL HIGH (ref 70–99)
Potassium: 3.6 mmol/L (ref 3.5–5.1)
Sodium: 135 mmol/L (ref 135–145)
Total Bilirubin: 0.5 mg/dL (ref 0.0–1.2)
Total Protein: 7.2 g/dL (ref 6.5–8.1)

## 2024-11-11 LAB — TROPONIN T, HIGH SENSITIVITY: Troponin T High Sensitivity: 21 ng/L — ABNORMAL HIGH (ref 0–19)

## 2024-11-11 LAB — LIPASE, BLOOD: Lipase: 34 U/L (ref 11–51)

## 2024-11-11 MED ORDER — PROCHLORPERAZINE EDISYLATE 10 MG/2ML IJ SOLN
10.0000 mg | Freq: Once | INTRAMUSCULAR | Status: AC
  Administered 2024-11-11: 10 mg via INTRAVENOUS
  Filled 2024-11-11: qty 2

## 2024-11-11 MED ORDER — DIPHENHYDRAMINE HCL 50 MG/ML IJ SOLN
25.0000 mg | Freq: Once | INTRAMUSCULAR | Status: AC
  Administered 2024-11-11: 25 mg via INTRAVENOUS
  Filled 2024-11-11: qty 1

## 2024-11-11 MED ORDER — ONDANSETRON 4 MG PO TBDP: 4.0000 mg | ORAL_TABLET | Freq: Three times a day (TID) | ORAL | 0 refills | Status: AC | PRN

## 2024-11-11 MED ORDER — IOHEXOL 300 MG/ML  SOLN
80.0000 mL | Freq: Once | INTRAMUSCULAR | Status: AC | PRN
  Administered 2024-11-11: 80 mL via INTRAVENOUS

## 2024-11-11 MED ORDER — SODIUM CHLORIDE 0.9 % IV BOLUS
1000.0000 mL | Freq: Once | INTRAVENOUS | Status: AC
  Administered 2024-11-11: 1000 mL via INTRAVENOUS

## 2024-11-11 NOTE — ED Provider Notes (Signed)
 79 yo female with cc vomiting x 3-6 days (20 episodes last night).  Reviewed with attending Dr. Mannie, pending 1 troponin and dc if negative.  Physical Exam  BP (!) 118/90   Pulse 80   Temp 98 F (36.7 C)   Resp 18   SpO2 97%   Physical Exam  Procedures  Procedures  ED Course / MDM    Medical Decision Making Amount and/or Complexity of Data Reviewed Labs: ordered. Radiology: ordered.  Risk Prescription drug management.   Troponin not significantly elevated compared to prior on file.  No complaints of chest pain.  Patient is feeling better at this time.  Tolerating p.o.'s.  She is ambulatory with a steady gait and ready for discharge.  Patient is provided with information to call GI to follow-up, return to ER and provided with prescription for Zofran  for vomiting.       Beverley Leita LABOR, PA-C 11/11/24 2307    Mannie Pac T, DO 11/11/24 2326

## 2024-11-11 NOTE — ED Triage Notes (Addendum)
 Patient reports abdominal pain/n/v for 3 days,  feeling her heart pounding/chest tightness. Denies fevers/dysuria. Patient is alert and oriented x 4. Airway patent, respirations even and unlabored. Skin normal, warm and dry.

## 2024-11-11 NOTE — ED Provider Notes (Signed)
 " [place] EMERGENCY DEPARTMENT AT [facility] Provider Note   CSN: [id number] Arrival date & time: 11/11/24  1756     Patient presents with: Abdominal Pain, Nausea, and Emesis   Katie [name] is a 79 y.o. female.    Abdominal Pain Associated symptoms: vomiting   Emesis Associated symptoms: abdominal pain   79 year old female presenting with nausea vomiting.  Patient was recently seen for this on 09/27/2024 when she was diagnosed with dehydration.  She reports that she has had bouts of vomiting where she cannot get it to stop.  She reports last night that she threw up 20 times.  The last time she threw up today it was about 2 hours ago.  She reports that she is still feeling nauseous.  Denies any abdominal pain or constipation or diarrhea.  Patient denies any blood in her vomit.     Prior to Admission medications  Medication Sig Start Date End Date Taking? Authorizing Provider  albuterol  (VENTOLIN  HFA) 108 (90 Base) MCG/ACT inhaler Inhale 2 puffs into the lungs every 6 (six) hours as needed for wheezing or shortness of breath. 02/08/20   Gladis Elsie BROCKS, PA-C  apixaban  (ELIQUIS ) 5 MG TABS tablet Take 1 tablet (5 mg total) by mouth 2 (two) times daily. 08/13/24   Vann, Jessica U, DO  diltiazem  (CARDIZEM  CD) 120 MG 24 hr capsule Take 1 capsule (120 mg total) by mouth daily. 07/26/24   Vann, Jessica U, DO  escitalopram (LEXAPRO) 10 MG tablet Take 10 mg by mouth daily. 07/29/24   [provider]  ferrous sulfate  325 (65 FE) MG tablet Take 1 tablet (325 mg total) by mouth daily with breakfast. 08/12/24   Juvenal Harlene PENNER, DO  Fluticasone -Umeclidin-Vilant (TRELEGY ELLIPTA ) 200-62.5-25 MCG/ACT AEPB Inhale 1 puff into the lungs daily. 03/05/23   Hunsucker, Donnice SAUNDERS, MD  furosemide  (LASIX ) 40 MG tablet Take 1 tablet (40 mg total) by mouth daily as needed for edema or fluid (For weight gain > 2 pounds in 24 hours or > 5 pounds in a week). 07/28/24   Hunsucker, Donnice SAUNDERS, MD   guaiFENesin  (MUCINEX ) 600 MG 12 hr tablet Take 2 tablets (1,200 mg total) by mouth 2 (two) times daily as needed. 08/11/24   Vann, Jessica U, DO  levalbuterol  (XOPENEX ) 0.63 MG/3ML nebulizer solution Take 3 mLs (0.63 mg total) by nebulization every 2 (two) hours as needed for wheezing or shortness of breath. 07/25/24   Vann, Jessica U, DO  loratadine  (CLARITIN ) 10 MG tablet Take 1 tablet (10 mg total) by mouth daily. 07/26/24   Vann, Jessica U, DO  losartan  (COZAAR ) 25 MG tablet Take 1 tablet (25 mg total) by mouth daily. 07/26/24   Vann, Jessica U, DO  methimazole  (TAPAZOLE ) 5 MG tablet Take 1 tablet (5 mg total) by mouth 2 (two) times daily. 07/25/24   Vann, Jessica U, DO  nicotine  (NICODERM CQ  - DOSED IN MG/24 HOURS) 14 mg/24hr patch Place 1 patch (14 mg total) onto the skin daily. 07/26/24   Vann, Jessica U, DO  ondansetron  (ZOFRAN -ODT) 4 MG disintegrating tablet Take 1 tablet (4 mg total) by mouth every 8 (eight) hours as needed. 09/27/24   Dean Clarity, MD  pantoprazole  (PROTONIX ) 40 MG tablet Take 1 tablet (40 mg total) by mouth daily. 09/27/24   Dean Clarity, MD  rosuvastatin  (CRESTOR ) 40 MG tablet Take 40 mg by mouth daily. 06/11/22   [provider]    Allergies: Patient has no known allergies.  Review of Systems  Gastrointestinal:  Positive for abdominal pain and vomiting.  All other systems reviewed and are negative.   Updated Vital Signs BP (!) 118/90   Pulse 80   Temp 98 F (36.7 C)   Resp 18   SpO2 97%   Physical Exam Vitals and nursing note reviewed.  HENT:     Mouth/Throat:     Pharynx: Oropharynx is clear.  Cardiovascular:     Rate and Rhythm: Normal rate.     Pulses: Normal pulses.  Pulmonary:     Effort: Pulmonary effort is normal.     Breath sounds: Normal breath sounds.  Abdominal:     General: Abdomen is flat. Bowel sounds are normal.     Palpations: Abdomen is soft.     Tenderness: There is abdominal tenderness. There is no guarding or  rebound. Negative signs include Murphy's sign and McBurney's sign.  Skin:    General: Skin is warm and dry.  Neurological:     General: No focal deficit present.     Mental Status: She is alert.     (all labs ordered are listed, but only abnormal results are displayed) Labs Reviewed  COMPREHENSIVE METABOLIC PANEL WITH GFR - Abnormal; Notable for the following components:      Result Value   Chloride 92 (*)    CO2 33 (*)    Glucose, Bld 111 (*)    BUN 35 (*)    Creatinine, Ser 1.70 (*)    ALT 49 (*)    GFR, Estimated 31 (*)    All other components within normal limits  CBC WITH DIFFERENTIAL/PLATELET - Abnormal; Notable for the following components:   RBC 5.16 (*)    All other components within normal limits  LIPASE, BLOOD  TROPONIN T, HIGH SENSITIVITY    EKG: EKG Interpretation Date/Time:  Friday November 11 2024 18:08:06 EST Ventricular Rate:  110 PR Interval:  150 QRS Duration:  90 QT Interval:  343 QTC Calculation: 464 R Axis:   -33  Text Interpretation: Sinus tachycardia LAE, consider biatrial enlargement Left ventricular hypertrophy Confirmed by Mannie Pac [phone]) on 11/11/2024 8:55:06 PM  Radiology: CT ABDOMEN PELVIS W CONTRAST Result Date: 11/11/2024 EXAM: CT ABDOMEN AND PELVIS WITH CONTRAST 11/11/2024 09:37:08 PM TECHNIQUE: CT of the abdomen and pelvis was performed with the administration of 80 mL of iohexol  (OMNIPAQUE ) 300 MG/ML solution. Multiplanar reformatted images are provided for review. Automated exposure control, iterative reconstruction, and/or weight-based adjustment of the mA/kV was utilized to reduce the radiation dose to as low as reasonably achievable. COMPARISON: 09/27/2024 CLINICAL HISTORY: Abdominal pain, nodular, and vomiting for 3 days. FINDINGS: LOWER CHEST: Nodule along the hemidiaphragm at the right lung base measuring 1.4 cm in diameter. This appears similar to the prior study and possibly relates to mucus plugging. Focal bleb or bulla in  the right lung base. Bronchial wall thickening with mucous plugging in the right lung base. LIVER: Multiple liver cysts, unchanged since prior study, likely benign. Mild fatty infiltration of the liver. GALLBLADDER AND BILE DUCTS: Surgical abscess of the gallbladder. No bile duct dilatation. SPLEEN: The spleen is unremarkable. PANCREAS: The pancreas is unremarkable. ADRENAL GLANDS: No adrenal gland nodules. KIDNEYS, URETERS AND BLADDER: The right kidney is surgically absent. No recurrent mass is identified. The left kidney is normal. No stones in the left kidney or ureter. No hydronephrosis or hydroureter. No perinephric or periureteral stranding. The bladder is normal. GI AND BOWEL: Stomach, small bowel, and colon are mostly decompressed.  No wall thickening or inflammatory stranding. Appendix is not seen. There is no bowel obstruction. PERITONEUM AND RETROPERITONEUM: No free air or free fluid in the abdomen. VASCULATURE: Postoperative aortobiiliac stent. Native aneurysm sac measures 3.5 x 4.1 cm, unchanged. The graft is patent although there is focal stenosis of the right common iliac artery representing up to about 70 percent diameter reduction focally. No change. LYMPH NODES: No lymphadenopathy. REPRODUCTIVE ORGANS: The uterus and ovaries are not enlarged. BONES AND SOFT TISSUES: Degenerative changes in the spine. Compression of the L3 vertebra without change. No acute osseous abnormality. No focal soft tissue abnormality. IMPRESSION: 1. No acute findings in the abdomen or pelvis. 2. Surgical absence of the gallbladder. No bile duct dilatation. 3. Nodule along the hemidiaphragm at the right lung base measuring 1.4 cm in diameter, possibly related to mucus plugging, similar to the prior study. Focal bleb or bulla and bronchial wall thickening with mucous plugging in the right lung base. 4. Postoperative aortobiiliac stent with patent graft and focal stenosis of the right common iliac artery representing up to  about 70 percent diameter reduction focally, unchanged. Electronically signed by: Elsie Gravely MD 11/11/2024 09:45 PM EST RP Workstation: [id number]     Procedures   Medications Ordered in the ED  sodium chloride  0.9 % bolus 1,000 mL (0 mLs Intravenous Stopped 11/11/24 2125)  prochlorperazine  (COMPAZINE ) injection 10 mg (10 mg Intravenous Given 11/11/24 1925)  diphenhydrAMINE  (BENADRYL ) injection 25 mg (25 mg Intravenous Given 11/11/24 1925)  iohexol  (OMNIPAQUE ) 300 MG/ML solution 80 mL (80 mLs Intravenous Contrast Given 11/11/24 2127)                                    Medical Decision Making Amount and/or Complexity of Data Reviewed Labs: ordered. Radiology: ordered.  Risk Prescription drug management.   Impression: 79 year old female presenting with nausea vomiting and some abdominal pain.  Differential diagnosis includes gastroenteritis, medication side effect, small bowel obstruction, pancreatitis, cholecystitis, diverticulosis, diverticulitis  Additional History: Patient was able to provide history.  I also viewed other outpatient notes  Labs: CBC showed no acute changes.  CMP showed a GFR of 31, creatinine of 1.7, BUN of 35, chloride of 92.  Lipase was 34.  Troponin was ordered  Imaging: CT abdomen pelvis showed no acute abnormalities.  I reviewed these images and agree with radiology's interpretation.  ED Course/Meds: 79 year old female presenting with abdominal pain and nausea vomiting.  Patient reports this been going on for about 6 days.  Her vomiting has been on and off since then.  She has not taken anything for this.  She has not been able to keep any fluids down.  She also complains of a little abdominal pain.  CT abdomen and pelvis was done and showed no acute changes.  Her CMP showed decreased GFR and elevated creatinine however these are not far off baseline.  Patient received Compazine  and Benadryl  and reports that her nausea felt much better.  Patient does not have  a GI doctor currently.  After talking with her we agreed that it be a good idea for her to see a GI doctor with these symptoms recently.  Patient did complain of a little chest tightness so a troponin was ordered.  Her EKG showed no acute changes.  Patient remained stable while she was in the ER during my shift.  Her troponin was not back before the end of my shift.  Leita Chancy, PA-C took over patient care.  She will interpret the final labs and decide disposition.  Patient      Final diagnoses:  Nausea and vomiting, unspecified vomiting type    ED Discharge Orders          Ordered    Ambulatory referral to Gastroenterology        11/11/24 2216               Katie [name] 11/11/24 2217    Mannie Pac T, DO 11/11/24 2325  "

## 2024-11-11 NOTE — Discharge Instructions (Addendum)
 Follow-up with GI.  Call to schedule an appointment. Return to the emergency room for worsening or concerning symptoms. Take Zofran  as needed as prescribed.

## 2024-11-23 ENCOUNTER — Encounter: Payer: Self-pay | Admitting: Acute Care
